# Patient Record
Sex: Male | Born: 1955 | Race: Black or African American | Hispanic: No | Marital: Single | Smoking: Former smoker
Health system: Southern US, Community
[De-identification: ages and names within clinical notes are randomized; demographics above are authoritative.]

## PROBLEM LIST (undated history)

## (undated) DIAGNOSIS — K649 Unspecified hemorrhoids: Secondary | ICD-10-CM

## (undated) DIAGNOSIS — J439 Emphysema, unspecified: Secondary | ICD-10-CM

## (undated) DIAGNOSIS — J9383 Other pneumothorax: Secondary | ICD-10-CM

## (undated) DIAGNOSIS — C61 Malignant neoplasm of prostate: Secondary | ICD-10-CM

## (undated) DIAGNOSIS — D496 Neoplasm of unspecified behavior of brain: Secondary | ICD-10-CM

## (undated) DIAGNOSIS — J45909 Unspecified asthma, uncomplicated: Secondary | ICD-10-CM

## (undated) DIAGNOSIS — R911 Solitary pulmonary nodule: Secondary | ICD-10-CM

## (undated) DIAGNOSIS — Z923 Personal history of irradiation: Secondary | ICD-10-CM

## (undated) DIAGNOSIS — F1010 Alcohol abuse, uncomplicated: Secondary | ICD-10-CM

## (undated) DIAGNOSIS — Z720 Tobacco use: Secondary | ICD-10-CM

## (undated) HISTORY — DX: Personal history of irradiation: Z92.3

## (undated) HISTORY — PX: PROSTATE BIOPSY: SHX241

## (undated) HISTORY — PX: BACK SURGERY: SHX140

---

## 2015-02-25 DIAGNOSIS — Z720 Tobacco use: Secondary | ICD-10-CM | POA: Diagnosis not present

## 2015-02-25 DIAGNOSIS — Z6822 Body mass index (BMI) 22.0-22.9, adult: Secondary | ICD-10-CM | POA: Diagnosis not present

## 2015-02-25 DIAGNOSIS — Z0000 Encounter for general adult medical examination without abnormal findings: Secondary | ICD-10-CM | POA: Diagnosis not present

## 2015-02-25 DIAGNOSIS — R030 Elevated blood-pressure reading, without diagnosis of hypertension: Secondary | ICD-10-CM | POA: Diagnosis not present

## 2015-02-25 DIAGNOSIS — E782 Mixed hyperlipidemia: Secondary | ICD-10-CM | POA: Diagnosis not present

## 2015-07-27 ENCOUNTER — Emergency Department (HOSPITAL_COMMUNITY)
Admission: EM | Admit: 2015-07-27 | Discharge: 2015-07-27 | Disposition: A | Discharge status: Home / Self Care | Payer: Commercial Managed Care - HMO | Attending: Emergency Medicine | Admitting: Emergency Medicine

## 2015-07-27 ENCOUNTER — Encounter (HOSPITAL_COMMUNITY): Payer: Self-pay

## 2015-07-27 DIAGNOSIS — F17210 Nicotine dependence, cigarettes, uncomplicated: Secondary | ICD-10-CM | POA: Insufficient documentation

## 2015-07-27 DIAGNOSIS — K5901 Slow transit constipation: Secondary | ICD-10-CM

## 2015-07-27 DIAGNOSIS — I159 Secondary hypertension, unspecified: Secondary | ICD-10-CM | POA: Diagnosis not present

## 2015-07-27 DIAGNOSIS — Z791 Long term (current) use of non-steroidal anti-inflammatories (NSAID): Secondary | ICD-10-CM | POA: Diagnosis not present

## 2015-07-27 DIAGNOSIS — R140 Abdominal distension (gaseous): Secondary | ICD-10-CM | POA: Diagnosis present

## 2015-07-27 DIAGNOSIS — K644 Residual hemorrhoidal skin tags: Secondary | ICD-10-CM | POA: Diagnosis not present

## 2015-07-27 HISTORY — DX: Unspecified hemorrhoids: K64.9

## 2015-07-27 NOTE — ED Notes (Signed)
Discharge instructions and follow up care reviewed with patient. Patient verbalized understanding. 

## 2015-07-27 NOTE — ED Triage Notes (Signed)
Pt here with constipation.  No bm x 3 days when he normally goes daily.  Eating and drinking like normal.  No n/v.  Abdomen is bloated. Pt has hemorrhoids.  No abdominal pain.  Using prune juice for small result only.

## 2015-07-27 NOTE — ED Provider Notes (Signed)
[facility] DEPT Provider Note   CSN: [id number] Arrival date & time: 07/27/15  1534  First Provider Contact:  First MD Initiated Contact with Patient 07/27/15 1633        History   Chief Complaint Chief Complaint  Patient presents with  . Constipation  . Rectal Pain    HPI DEMTRIUS ROUNDS is a 60 y.o. male.  He presents for evaluation of no stooling and abdominal swelling, for 3 days. He also has worsening hemorrhoids. During this period of time. Denies nausea, vomiting, fever, chills, headache, weakness or paresthesia. No prior similar problems. No prior history of hypertension, heart disease, or CVA. There are no other known modifying factors.   HPI  Past Medical History:  Diagnosis Date  . Hemorrhoids     There are no active problems to display for this patient.   Past Surgical History:  Procedure Laterality Date  . BACK SURGERY         Home Medications    Prior to Admission medications   Medication Sig Start Date End Date Taking? Authorizing Provider  naproxen sodium (ANAPROX) 220 MG tablet Take 220 mg by mouth daily as needed (for pain).   Yes Historical Provider, MD    Family History No family history on file.  Social History Social History  Substance Use Topics  . Smoking status: Current Every Day Smoker    Types: Cigarettes  . Smokeless tobacco: Never Used  . Alcohol use 1.8 oz/week    3 Cans of beer per week     Comment: daily     Allergies   Review of patient's allergies indicates no known allergies.   Review of Systems Review of Systems  All other systems reviewed and are negative.    Physical Exam Updated Vital Signs BP (!) 173/120 (BP Location: Left Arm)   Pulse 99   Temp 98.1 F (36.7 C) (Oral)   Resp 18   Ht '5\' 9"'$  (1.753 m)   Wt 155 lb (70.3 kg)   SpO2 97%   BMI 22.89 kg/m   Physical Exam  Constitutional: He appears well-developed and well-nourished.  HENT:  Head: Normocephalic and atraumatic.  Eyes:  Conjunctivae are normal.  Neck: Neck supple.  Cardiovascular: Normal rate.   Pulmonary/Chest: Effort normal. No respiratory distress.  Abdominal: Soft. He exhibits distension. He exhibits no mass. There is no tenderness. There is no guarding. No hernia.  Genitourinary:  Genitourinary Comments: Soft posterior external hemorrhoid. No stool in rectum on digital exam.  Musculoskeletal: He exhibits no edema.  Neurological: He is alert.  Skin: Skin is warm and dry. Rash: .ewed.  Psychiatric: He has a normal mood and affect.  Nursing note and vitals reviewed.    ED Treatments / Results  Labs (all labs ordered are listed, but only abnormal results are displayed) Labs Reviewed - No data to display  EKG  EKG Interpretation None       Radiology No results found.  Procedures Procedures (including critical care time)  Medications Ordered in ED Medications - No data to display   Initial Impression / Assessment and Plan / ED Course  I have reviewed the triage vital signs and the nursing notes.  Pertinent labs & imaging results that were available during my care of the patient were reviewed by me and considered in my medical decision making (see chart for details).  Clinical Course    Medications - No data to display  No data found.   6:26 PM Reevaluation with  update and discussion. After initial assessment and treatment, an updated evaluation reveals He remains comfortable. Findings discussed with patient, all questions were answered. Karmello Abercrombie L    Final Clinical Impressions(s) / ED Diagnoses   Final diagnoses:  Slow transit constipation  Secondary hypertension, unspecified    Abdominal pain with constipation. Mild hypertension. Doubt colitis, heart disease urgency, or metabolic instability.  Nursing Notes Reviewed/ Care Coordinated Applicable Imaging Reviewed Interpretation of Laboratory Data incorporated into ED treatment  The patient appears reasonably  screened and/or stabilized for discharge and I doubt any other medical condition or other [facility] requiring further screening, evaluation, or treatment in the ED at this time prior to discharge.  Plan: Home Medications- Miralax and Mag Citrate; Home Treatments- rest; return here if the recommended treatment, does not improve the symptoms; Recommended follow up- PCP check up in 1 week   New Prescriptions Discharge Medication List as of 07/27/2015  6:25 PM       Daleen Bo, MD 07/30/15 [phone]

## 2015-07-27 NOTE — Discharge Instructions (Signed)
For the hemorrhoids, soak in a warm tub 2 or 3 times a day.  It is also helpful, to use cortisone cream, 1% on the hemorrhoids. 2 or 3 times a day.  Your blood pressure is very high today. It is important to decrease the amount of salt intake, in your diet, and follow-up with a primary care doctor as soon as possible for further evaluation, and possible treatment of high blood pressure, with medications.  For the constipation, use magnesium citrate one half bottle today and one half tomorrow.  Also start a stool softener such as MiraLAX or Castor oil, and take it twice a day for 2 weeks.  It will also help to start using a high-fiber diet, and drinking 2 L of water each day.  Return here, if needed, for problems.

## 2015-07-27 NOTE — ED Notes (Signed)
MD aware of patient's vital signs; MD states patient to be discharged.

## 2016-10-07 ENCOUNTER — Encounter (HOSPITAL_COMMUNITY): Payer: Self-pay | Admitting: *Deleted

## 2016-10-07 ENCOUNTER — Emergency Department (HOSPITAL_COMMUNITY)
Admission: EM | Admit: 2016-10-07 | Discharge: 2016-10-07 | Disposition: A | Discharge status: Home / Self Care | Payer: Medicare HMO | Attending: Emergency Medicine | Admitting: Emergency Medicine

## 2016-10-07 DIAGNOSIS — R030 Elevated blood-pressure reading, without diagnosis of hypertension: Secondary | ICD-10-CM | POA: Diagnosis not present

## 2016-10-07 DIAGNOSIS — Z5321 Procedure and treatment not carried out due to patient leaving prior to being seen by health care provider: Secondary | ICD-10-CM

## 2016-10-07 DIAGNOSIS — R21 Rash and other nonspecific skin eruption: Secondary | ICD-10-CM | POA: Diagnosis present

## 2016-10-07 NOTE — ED Triage Notes (Signed)
Pt not in room.

## 2016-10-07 NOTE — ED Provider Notes (Signed)
Blood pressure (!) 160/111, pulse 100, temperature 97.6 F (36.4 C), temperature source Oral, resp. rate 16, SpO2 100 %.  Terry Page is a 61 y.o. male left without being seen after triage. I did not participate in the care of this patient.   Waynetta Pean 10/07/16 1616    Dorie Rank, MD 10/08/16 [phone]

## 2016-10-07 NOTE — ED Triage Notes (Signed)
Pt bib EMS and approx 4 days ago pt was exposed to [place].  Pt reports blisters on his hands and feet.  They are itching and when pt scratches, they start bleeding.  Pt has using a vasaline regimine without relief.  Pt denies any medical history but pt has been hypertensive for EMS. Denis any drugs or ETOH use.  Pt a/o x 4.   EMS VS:   BP: 162/116, HR: 70, RR: 16

## 2016-10-07 NOTE — ED Triage Notes (Signed)
Pt not in room. PA advised

## 2017-10-20 ENCOUNTER — Inpatient Hospital Stay (HOSPITAL_COMMUNITY): Payer: Medicare HMO

## 2017-10-20 ENCOUNTER — Inpatient Hospital Stay (HOSPITAL_COMMUNITY)
Admission: EM | Admit: 2017-10-20 | Discharge: 2017-10-24 | DRG: 201 | Disposition: A | Discharge status: Home / Self Care | Payer: Medicare HMO | Attending: Thoracic Surgery (Cardiothoracic Vascular Surgery) | Admitting: Thoracic Surgery (Cardiothoracic Vascular Surgery)

## 2017-10-20 ENCOUNTER — Emergency Department (HOSPITAL_COMMUNITY): Payer: Medicare HMO

## 2017-10-20 ENCOUNTER — Other Ambulatory Visit: Payer: Self-pay

## 2017-10-20 ENCOUNTER — Encounter (HOSPITAL_COMMUNITY): Payer: Self-pay | Admitting: Emergency Medicine

## 2017-10-20 DIAGNOSIS — J939 Pneumothorax, unspecified: Secondary | ICD-10-CM | POA: Diagnosis not present

## 2017-10-20 DIAGNOSIS — Z720 Tobacco use: Secondary | ICD-10-CM | POA: Diagnosis present

## 2017-10-20 DIAGNOSIS — R0603 Acute respiratory distress: Secondary | ICD-10-CM

## 2017-10-20 DIAGNOSIS — J984 Other disorders of lung: Secondary | ICD-10-CM | POA: Diagnosis not present

## 2017-10-20 DIAGNOSIS — R0689 Other abnormalities of breathing: Secondary | ICD-10-CM | POA: Diagnosis not present

## 2017-10-20 DIAGNOSIS — I10 Essential (primary) hypertension: Secondary | ICD-10-CM | POA: Diagnosis not present

## 2017-10-20 DIAGNOSIS — F17210 Nicotine dependence, cigarettes, uncomplicated: Secondary | ICD-10-CM | POA: Diagnosis present

## 2017-10-20 DIAGNOSIS — Z4682 Encounter for fitting and adjustment of non-vascular catheter: Secondary | ICD-10-CM | POA: Diagnosis not present

## 2017-10-20 DIAGNOSIS — I499 Cardiac arrhythmia, unspecified: Secondary | ICD-10-CM | POA: Diagnosis not present

## 2017-10-20 DIAGNOSIS — J441 Chronic obstructive pulmonary disease with (acute) exacerbation: Secondary | ICD-10-CM | POA: Diagnosis present

## 2017-10-20 DIAGNOSIS — J9383 Other pneumothorax: Secondary | ICD-10-CM

## 2017-10-20 DIAGNOSIS — J80 Acute respiratory distress syndrome: Secondary | ICD-10-CM | POA: Diagnosis not present

## 2017-10-20 DIAGNOSIS — Z9689 Presence of other specified functional implants: Secondary | ICD-10-CM

## 2017-10-20 DIAGNOSIS — J439 Emphysema, unspecified: Secondary | ICD-10-CM | POA: Diagnosis present

## 2017-10-20 DIAGNOSIS — J9311 Primary spontaneous pneumothorax: Secondary | ICD-10-CM | POA: Diagnosis not present

## 2017-10-20 DIAGNOSIS — R911 Solitary pulmonary nodule: Secondary | ICD-10-CM | POA: Diagnosis present

## 2017-10-20 DIAGNOSIS — R0902 Hypoxemia: Secondary | ICD-10-CM | POA: Diagnosis not present

## 2017-10-20 DIAGNOSIS — Z79899 Other long term (current) drug therapy: Secondary | ICD-10-CM | POA: Diagnosis not present

## 2017-10-20 DIAGNOSIS — R0602 Shortness of breath: Secondary | ICD-10-CM | POA: Diagnosis not present

## 2017-10-20 DIAGNOSIS — F1010 Alcohol abuse, uncomplicated: Secondary | ICD-10-CM | POA: Diagnosis present

## 2017-10-20 HISTORY — DX: Alcohol abuse, uncomplicated: F10.10

## 2017-10-20 HISTORY — DX: Solitary pulmonary nodule: R91.1

## 2017-10-20 HISTORY — DX: Other pneumothorax: J93.83

## 2017-10-20 HISTORY — DX: Tobacco use: Z72.0

## 2017-10-20 HISTORY — DX: Emphysema, unspecified: J43.9

## 2017-10-20 LAB — CBC WITH DIFFERENTIAL/PLATELET
Abs Immature Granulocytes: 0.04 10*3/uL (ref 0.00–0.07)
Basophils Absolute: 0 10*3/uL (ref 0.0–0.1)
Basophils Relative: 0 %
Eosinophils Absolute: 0 10*3/uL (ref 0.0–0.5)
Eosinophils Relative: 0 %
HCT: 43 % (ref 39.0–52.0)
Hemoglobin: 14.6 g/dL (ref 13.0–17.0)
Immature Granulocytes: 0 %
Lymphocytes Relative: 10 %
Lymphs Abs: 1.3 10*3/uL (ref 0.7–4.0)
MCH: 30.8 pg (ref 26.0–34.0)
MCHC: 34 g/dL (ref 30.0–36.0)
MCV: 90.7 fL (ref 80.0–100.0)
Monocytes Absolute: 1 10*3/uL (ref 0.1–1.0)
Monocytes Relative: 8 %
Neutro Abs: 10 10*3/uL — ABNORMAL HIGH (ref 1.7–7.7)
Neutrophils Relative %: 82 %
Platelets: 182 10*3/uL (ref 150–400)
RBC: 4.74 MIL/uL (ref 4.22–5.81)
RDW: 13.9 % (ref 11.5–15.5)
WBC: 12.4 10*3/uL — ABNORMAL HIGH (ref 4.0–10.5)
nRBC: 0 % (ref 0.0–0.2)

## 2017-10-20 LAB — BASIC METABOLIC PANEL
Anion gap: 12 (ref 5–15)
BUN: <5 mg/dL — ABNORMAL LOW (ref 8–23)
CO2: 24 mmol/L (ref 22–32)
Calcium: 9.6 mg/dL (ref 8.9–10.3)
Chloride: 94 mmol/L — ABNORMAL LOW (ref 98–111)
Creatinine, Ser: 0.65 mg/dL (ref 0.61–1.24)
GFR calc Af Amer: >60 mL/min (ref >60)
GFR calc non Af Amer: >60 mL/min (ref >60)
Glucose, Bld: 143 mg/dL — ABNORMAL HIGH (ref 70–99)
Potassium: 4.1 mmol/L (ref 3.5–5.1)
Sodium: 130 mmol/L — ABNORMAL LOW (ref 135–145)

## 2017-10-20 LAB — I-STAT TROPONIN, ED: Troponin i, poc: 0 ng/mL (ref 0.00–0.08)

## 2017-10-20 LAB — BRAIN NATRIURETIC PEPTIDE: B Natriuretic Peptide: 60.8 pg/mL (ref 0.0–100.0)

## 2017-10-20 MED ORDER — MORPHINE SULFATE (PF) 2 MG/ML IV SOLN
INTRAVENOUS | Status: AC
  Filled 2017-10-20: qty 1

## 2017-10-20 MED ORDER — OXYCODONE HCL 5 MG PO TABS: 10.0000 mg | ORAL_TABLET | ORAL | Status: DC | PRN

## 2017-10-20 MED ORDER — SODIUM CHLORIDE 0.9 % IV BOLUS
1000.0000 mL | Freq: Once | INTRAVENOUS | Status: AC
  Administered 2017-10-20: 1000 mL via INTRAVENOUS

## 2017-10-20 MED ORDER — ENOXAPARIN SODIUM 40 MG/0.4ML ~~LOC~~ SOLN
40.0000 mg | SUBCUTANEOUS | Status: DC
  Administered 2017-10-22 – 2017-10-24 (×3): 40 mg via SUBCUTANEOUS
  Filled 2017-10-20 (×4): qty 0.4

## 2017-10-20 MED ORDER — LIDOCAINE HCL (PF) 1 % IJ SOLN
INTRAMUSCULAR | Status: AC
  Administered 2017-10-20: 18:00:00
  Filled 2017-10-20: qty 30

## 2017-10-20 MED ORDER — DOCUSATE SODIUM 100 MG PO CAPS
200.0000 mg | ORAL_CAPSULE | Freq: Two times a day (BID) | ORAL | Status: DC
  Administered 2017-10-20 – 2017-10-24 (×7): 200 mg via ORAL
  Filled 2017-10-20 (×9): qty 2

## 2017-10-20 MED ORDER — MORPHINE SULFATE (PF) 2 MG/ML IV SOLN
2.0000 mg | Freq: Once | INTRAVENOUS | Status: AC
  Administered 2017-10-20: 2 mg via INTRAVENOUS
  Filled 2017-10-20: qty 1

## 2017-10-20 MED ORDER — ACETAMINOPHEN 325 MG PO TABS: 650.0000 mg | ORAL_TABLET | Freq: Four times a day (QID) | ORAL | Status: DC | PRN

## 2017-10-20 MED ORDER — LABETALOL HCL 5 MG/ML IV SOLN
10.0000 mg | INTRAVENOUS | Status: DC | PRN
  Administered 2017-10-20 – 2017-10-24 (×4): 10 mg via INTRAVENOUS
  Filled 2017-10-20 (×4): qty 4

## 2017-10-20 MED ORDER — MIDAZOLAM HCL 2 MG/2ML IJ SOLN
2.0000 mg | Freq: Once | INTRAMUSCULAR | Status: AC
  Administered 2017-10-20: 2 mg via INTRAVENOUS
  Filled 2017-10-20: qty 2

## 2017-10-20 MED ORDER — MORPHINE SULFATE (PF) 2 MG/ML IV SOLN
2.0000 mg | Freq: Once | INTRAVENOUS | Status: AC
  Administered 2017-10-20: 2 mg via INTRAVENOUS

## 2017-10-20 MED ORDER — MORPHINE SULFATE (PF) 2 MG/ML IV SOLN
2.0000 mg | INTRAVENOUS | Status: DC | PRN
Start: 2017-10-20 — End: 2017-10-23
  Administered 2017-10-20 – 2017-10-21 (×2): 2 mg via INTRAVENOUS
  Filled 2017-10-20 (×2): qty 1

## 2017-10-20 MED ORDER — IPRATROPIUM-ALBUTEROL 0.5-2.5 (3) MG/3ML IN SOLN: 3.0000 mL | Freq: Four times a day (QID) | RESPIRATORY_TRACT | Status: DC | PRN

## 2017-10-20 MED ORDER — OXYCODONE HCL 5 MG PO TABS: 5.0000 mg | ORAL_TABLET | ORAL | Status: DC | PRN

## 2017-10-20 NOTE — ED Triage Notes (Signed)
Per [facility], pt from home with a c/o of SOB since 11am. Noted to have labored breathing, diminished lungs sounds, wheezing, and possible plueral rub noted to right lung. 5 mg albuterol neb tx administered. Crackles and wheezing noted post neb tx. A total of 3 tablets of nitro (0.4 mg tabs) given with a decrease in BP.   Initial on scene v/s: BP 240/160 HR140 RR 40 SpO2 82 % RA  Post nitro and neb: BP 132/84 HR 152 RR 32 SpO2 97 % with CPAP CO2 22 CBG 133

## 2017-10-20 NOTE — ED Provider Notes (Signed)
[facility] EMERGENCY DEPARTMENT Provider Note   CSN: [id number] Arrival date & time: 10/20/17  1614     History   Chief Complaint Chief Complaint  Patient presents with  . SOB/C-pap    HPI Terry Page is a 62 y.o. male.  He is brought in by EMS after calling 911 for acute shortness of breath.  He says around 11 AM he was getting more short of breath especially when he laid down.  He was feeling some tightness in his chest.  EMS found him to have diminished breath sounds and gave him 5 mg of albuterol.  After the treatment he began to be more tachycardic and wet sounding so they put him on BiPAP and gave him 3 tablets of nitroglycerin for systolics above 350.  His initial sat was 82% on arrival by EMS.  Currently the patient is complaining of some improved shortness of breath denies chest pain.  He said he is never had this problem before.  No fevers no chills no abdominal pain vomiting or diarrhea.  He is a smoker but does not see a doctor and does not have any medical problems.  The history is provided by the patient.  Shortness of Breath  This is a new problem. The problem has been gradually improving. Associated symptoms include cough, wheezing and orthopnea. Pertinent negatives include no fever, no headaches, no rhinorrhea, no sore throat, no neck pain, no sputum production, no hemoptysis, no chest pain, no abdominal pain, no rash, no leg pain and no leg swelling. It is unknown what precipitated the problem. Risk factors include smoking. He has tried nothing for the symptoms. The treatment provided no relief. He has had no prior hospitalizations.    Past Medical History:  Diagnosis Date  . Hemorrhoids     There are no active problems to display for this patient.   Past Surgical History:  Procedure Laterality Date  . BACK SURGERY          Home Medications    Prior to Admission medications   Medication Sig Start Date End Date Taking? Authorizing  Provider  naproxen sodium (ANAPROX) 220 MG tablet Take 220 mg by mouth daily as needed (for pain).    [provider]    Family History No family history on file.  Social History Social History   Tobacco Use  . Smoking status: Current Every Day Smoker    Packs/day: 0.50    Types: Cigarettes  . Smokeless tobacco: Never Used  Substance Use Topics  . Alcohol use: Yes    Alcohol/week: 3.0 standard drinks    Types: 3 Cans of beer per week    Comment: daily  . Drug use: No     Allergies   Patient has no known allergies.   Review of Systems Review of Systems  Constitutional: Negative for fever.  HENT: Negative for rhinorrhea and sore throat.   Eyes: Negative for visual disturbance.  Respiratory: Positive for cough, shortness of breath and wheezing. Negative for hemoptysis and sputum production.   Cardiovascular: Positive for orthopnea. Negative for chest pain and leg swelling.  Gastrointestinal: Negative for abdominal pain.  Genitourinary: Negative for dysuria.  Musculoskeletal: Negative for neck pain.  Skin: Negative for rash.  Neurological: Negative for headaches.     Physical Exam Updated Vital Signs Pulse (!) 145   Physical Exam  Constitutional: He appears well-developed and well-nourished.  HENT:  Head: Normocephalic and atraumatic.  Eyes: Conjunctivae are normal.  Neck:  Neck supple.  Cardiovascular: Regular rhythm and intact distal pulses. Tachycardia present.  No murmur heard. Pulmonary/Chest: Tachypnea noted. No respiratory distress. He has wheezes. He has rales in the right lower field and the left lower field.  Abdominal: Soft. There is no tenderness.  Musculoskeletal: Normal range of motion. He exhibits no edema, tenderness or deformity.  Neurological: He is alert. He has normal strength. No sensory deficit. GCS eye subscore is 4. GCS verbal subscore is 5. GCS motor subscore is 6.  Skin: Skin is warm and dry.  Psychiatric: He has a normal mood  and affect.  Nursing note and vitals reviewed.    ED Treatments / Results  Labs (all labs ordered are listed, but only abnormal results are displayed) Labs Reviewed  BASIC METABOLIC PANEL - Abnormal; Notable for the following components:      Result Value   Sodium 130 (*)    Chloride 94 (*)    Glucose, Bld 143 (*)    BUN <5 (*)    All other components within normal limits  CBC WITH DIFFERENTIAL/PLATELET - Abnormal; Notable for the following components:   WBC 12.4 (*)    Neutro Abs 10.0 (*)    All other components within normal limits  BRAIN NATRIURETIC PEPTIDE  I-STAT TROPONIN, ED    EKG EKG Interpretation  Date/Time:  Saturday October 20 2017 16:22:00 EDT Ventricular Rate:  147 PR Interval:    QRS Duration: 86 QT Interval:  278 QTC Calculation: 435 R Axis:   -56 Text Interpretation:  Sinus tachycardia Consider right atrial enlargement LAD, consider left anterior fascicular block Probable left ventricular hypertrophy Anterior Q waves, possibly due to LVH no prior to compare with Confirmed by Aletta Edouard [phone]) on 10/20/2017 4:32:06 PM   Radiology Dg Chest Portable 1 View  Result Date: 10/20/2017 CLINICAL DATA:  Shortness of Breath EXAM: PORTABLE CHEST 1 VIEW COMPARISON:  None. FINDINGS: There is a large right pneumothorax, approximately 30-40%. Scarring in the left upper lobe. Otherwise left lung clear. Heart is mildly enlarged. No effusions or acute bony abnormality. IMPRESSION: Large right pneumothorax, approximately 30-40%. Left upper lobe scarring. These results were called by telephone at the time of interpretation on 10/20/2017 at 5:19 pm to Dr. Aletta Edouard , who verbally acknowledged these results. Electronically Signed   By: Rolm Baptise M.D.   On: 10/20/2017 17:22    Procedures .Critical Care Performed by: Hayden Rasmussen, MD Authorized by: Hayden Rasmussen, MD   Critical care provider statement:    Critical care time (minutes):  30   Critical  care time was exclusive of:  Separately billable procedures and treating other patients   Critical care was necessary to treat or prevent imminent or life-threatening deterioration of the following conditions:  Respiratory failure   Critical care was time spent personally by me on the following activities:  Discussions with consultants, evaluation of patient's response to treatment, examination of patient, ordering and performing treatments and interventions, ordering and review of laboratory studies, ordering and review of radiographic studies, pulse oximetry, re-evaluation of patient's condition, obtaining history from patient or surrogate, review of old charts and development of treatment plan with patient or surrogate   I assumed direction of critical care for this patient from another provider in my specialty: no     (including critical care time)  Medications Ordered in ED Medications - No data to display   Initial Impression / Assessment and Plan / ED Course  I have reviewed the triage  vital signs and the nursing notes.  Pertinent labs & imaging results that were available during my care of the patient were reviewed by me and considered in my medical decision making (see chart for details).  Clinical Course as of Oct 21 1810  Sat Oct 20, 2017  1727 Discussed with Dr. Roxy Manns from [facility] who will present to the emergency department to evaluate the patient and likely be putting a chest tube and admitting to his service.  Dated the patient and his family on current status.   [MB]    Clinical Course User Index [MB] Hayden Rasmussen, MD     Final Clinical Impressions(s) / ED Diagnoses   Final diagnoses:  Primary spontaneous pneumothorax  Respiratory distress    ED Discharge Orders    None       Hayden Rasmussen, MD 10/21/17 1153

## 2017-10-20 NOTE — H&P (Signed)
[address]       [place] [address]             [phone]          CARDIOTHORACIC SURGERY HISTORY AND PHYSICAL EXAM  PCP is Dr. Nyoka Cowden Referring Provider is No ref. provider found  Chief Complaint:  Shortness of breath  HPI:  Patient is a 62 year old African-American male with long-standing history of tobacco abuse and heavy alcohol use who was in his usual state of health until approximately 11 AM this morning when he developed sudden onset of severe shortness of breath.  He was brought in via EMS to the emergency department where chest x-ray reveals large right pneumothorax.  Cardiothoracic surgical consultation was requested.   Patient is married and lives locally in [place].  He is retired.  He states that he remains reasonably active physically although he does not exercise on a regular basis.  He reports no specific physical limitations.  He denies any history of recent trauma of any type.  He denies any recent respiratory illnesses or coughing.  He denies any previous history of spontaneous pneumothorax.  He smokes less than 1/2 pack of cigarettes daily but he has smoked for many years.  He denies any previous known history of COPD or emphysema.  He drinks at least 4 5 beers every day.  Past Medical History:  Diagnosis Date  . Hemorrhoids     Past Surgical History:  Procedure Laterality Date  . BACK SURGERY      History reviewed. No pertinent family history.  Social History   Socioeconomic History  . Marital status: Single    Spouse name: Not on file  . Number of children: Not on file  . Years of education: Not on file  . Highest education level: Not on file  Occupational History  . Not on file  Social Needs  . Financial resource strain: Not on file  . Food insecurity:    Worry: Not on file    Inability: Not on file  . Transportation needs:    Medical: Not on file    Non-medical: Not on file  Tobacco Use  . Smoking status:  Current Every Day Smoker    Packs/day: 0.50    Types: Cigarettes  . Smokeless tobacco: Never Used  Substance and Sexual Activity  . Alcohol use: Yes    Alcohol/week: 12.0 standard drinks    Types: 12 Cans of beer per week    Comment: daily  . Drug use: No  . Sexual activity: Not on file  Lifestyle  . Physical activity:    Days per week: Not on file    Minutes per session: Not on file  . Stress: Not on file  Relationships  . Social connections:    Talks on phone: Not on file    Gets together: Not on file    Attends religious service: Not on file    Active member of club or organization: Not on file    Attends meetings of clubs or organizations: Not on file    Relationship status: Not on file  . Intimate partner violence:    Fear of current or ex partner: Not on file    Emotionally abused: Not on file    Physically abused: Not on file    Forced sexual activity: Not on file  Other Topics Concern  . Not on file  Social History Narrative  . Not on file  Prior to Admission medications   Medication Sig Start Date End Date Taking? Authorizing Provider  naproxen sodium (ANAPROX) 220 MG tablet Take 220 mg by mouth daily as needed (for pain).    [provider]    Current Facility-Administered Medications  Medication Dose Route Frequency Provider Last Rate Last Dose  . midazolam (VERSED) injection 2 mg  2 mg Intravenous Once Rexene Alberts, MD      . morphine 2 MG/ML injection 2 mg  2 mg Intravenous Once Rexene Alberts, MD      . sodium chloride 0.9 % bolus 1,000 mL  1,000 mL Intravenous Once Hayden Rasmussen, MD 983.6 mL/hr at 10/20/17 1729 1,000 mL at 10/20/17 1729   Current Outpatient Medications  Medication Sig Dispense Refill  . naproxen sodium (ANAPROX) 220 MG tablet Take 220 mg by mouth daily as needed (for pain).      No Known Allergies    Review of Systems:   General:  normal appetite, normal energy, no weight gain, no weight loss, no  fever  Cardiac:  no chest pain with exertion, + chest pain at rest, + SOB with exertion, + sudden onset resting SOB, no PND, no orthopnea, no palpitations, no arrhythmia, no atrial fibrillation, no LE edema, no dizzy spells, no syncope  Respiratory:  + sudden onset shortness of breath, no home oxygen, no productive cough, no dry cough, no bronchitis, no wheezing, no hemoptysis, no asthma, no pain with inspiration or cough, no sleep apnea, no CPAP at night  GI:   no difficulty swallowing, no reflux, no frequent heartburn, no hiatal hernia, no abdominal pain, no constipation, no diarrhea, no hematochezia, no hematemesis, no melena  GU:   no dysuria,  no frequency, no urinary tract infection, no hematuria, no enlarged prostate, no kidney stones, no kidney disease  Vascular:  no pain suggestive of claudication, no pain in feet, no leg cramps, no varicose veins, no DVT, no non-healing foot ulcer  Neuro:   no stroke, no TIA's, no seizures, no headaches, no temporary blindness one eye,  no slurred speech, no peripheral neuropathy, no chronic pain, no instability of gait, no memory/cognitive dysfunction  Musculoskeletal: no arthritis , no joint swelling, no myalgias, no difficulty walking, normal mobility   Skin:   no rash, no itching, no skin infections, no pressure sores or ulcerations  Psych:   no anxiety, no depression, no nervousness, no unusual recent stress  Eyes:   no blurry vision, no floaters, no recent vision changes, does not wears glasses or contacts  ENT:   no hearing loss, no loose or painful teeth  Hematologic:  no easy bruising, no abnormal bleeding, no clotting disorder, no frequent epistaxis  Endocrine:  no diabetes, does not check CBG's at home     Physical Exam:   BP (!) 122/103   Pulse (!) 131   Temp (!) 97.5 F (36.4 C) (Axillary)   Resp (!) 22   Ht 5\' 10"  (1.778 m)   Wt 68 kg   SpO2 99%   BMI 21.52 kg/m   General:  WDWN male breathing comfortably on BiPAP    HEENT:  Unremarkable   Neck:   no JVD, no bruits, no adenopathy   Chest:   clear to auscultation, diminished breath sounds on right, few exp wheezes, no rhonchi   CV:   RRR, no  murmur   Abdomen:  soft, non-tender, no masses   Extremities:  warm, well-perfused, pulses palpable, no lower extremity edema  Rectal/GU  Deferred  Neuro:   Grossly non-focal and symmetrical throughout  Skin:   Clean and dry, no rashes, no breakdown  Diagnostic Tests:  Lab Results: Recent Labs    10/20/17 1624  WBC 12.4*  HGB 14.6  HCT 43.0  PLT 182   BMET:  Recent Labs    10/20/17 1624  NA 130*  K 4.1  CL 94*  CO2 24  GLUCOSE 143*  BUN <5*  CREATININE 0.65  CALCIUM 9.6    CBG (last 3)  No results for input(s): GLUCAP in the last 72 hours. PT/INR:  No results for input(s): LABPROT, INR in the last 72 hours.  CXR:  PORTABLE CHEST 1 VIEW  COMPARISON:  None.  FINDINGS: There is a large right pneumothorax, approximately 30-40%. Scarring in the left upper lobe. Otherwise left lung clear. Heart is mildly enlarged. No effusions or acute bony abnormality.  IMPRESSION: Large right pneumothorax, approximately 30-40%.  Left upper lobe scarring.  These results were called by telephone at the time of interpretation on 10/20/2017 at 5:19 pm to Dr. Aletta Edouard , who verbally acknowledged these results.   Electronically Signed   By: Rolm Baptise M.D.   On: 10/20/2017 17:22   EKG: Sinus tach w/out acute ischemic changes   Impression:  Large right primary spontaneous pneumothorax   Plan:  Chest tube placement and hospital admission.    I have discussed the nature of the patient's clinical problem at length with the patient and his family at the bedside.  We discussed treatment options and expectations for his convalescence.  Indications risks and potential benefits of chest tube placement were discussed at length.  The possibility of need for surgical intervention due to  prolonged air leak and/or recurrent pneumothorax was discussed.  All questions answered.   I spent in excess of 60 minutes during the conduct of this hospital consultation and >50% of this time involved direct face-to-face encounter for counseling and/or coordination of the patient's care.   Valentina Gu. Roxy Manns, MD 10/20/2017 5:52 PM

## 2017-10-20 NOTE — ED Triage Notes (Signed)
Pt reported to have a new onset of a cough x1 week.

## 2017-10-20 NOTE — Op Note (Signed)
CARDIOTHORACIC SURGERY OPERATIVE NOTE  Date of Procedure:  10/20/2017  Preoperative Diagnosis: Right Spontaneous Pneumothorax  Postoperative Diagnosis: Same  Procedure:   Right chest tube placement  Surgeon:   Valentina Gu. Roxy Manns, MD  Anesthesia: 1% lidocaine local with intravenous sedation    DETAILS OF THE OPERATIVE PROCEDURE  Following full informed consent the patient was given midazolam 2 mg intravenously and continuously monitored for rhythm, BP and oxygen saturation. The right chest was prepared and draped in a sterile manner. 1% lidocaine was utilized to anesthetize the skin and subcutaneous tissues. A small incision was made and a 28 French straight chest tube was placed through the incision into the pleural space. The tube was secured to the skin and connected to a closed suction collection device. The patient tolerated the procedure well. A portable CXR was ordered. There were no complications.    Valentina Gu. Roxy Manns, MD 10/20/2017 6:27 PM

## 2017-10-21 ENCOUNTER — Inpatient Hospital Stay (HOSPITAL_COMMUNITY): Payer: Medicare HMO

## 2017-10-21 MED ORDER — LEVALBUTEROL HCL 0.63 MG/3ML IN NEBU
0.6300 mg | INHALATION_SOLUTION | Freq: Three times a day (TID) | RESPIRATORY_TRACT | Status: DC
  Administered 2017-10-21 – 2017-10-22 (×6): 0.63 mg via RESPIRATORY_TRACT
  Filled 2017-10-21 (×7): qty 3

## 2017-10-21 MED ORDER — IBUPROFEN 400 MG PO TABS: 800.0000 mg | ORAL_TABLET | Freq: Three times a day (TID) | ORAL | Status: DC | PRN

## 2017-10-21 MED ORDER — TRAMADOL HCL 50 MG PO TABS
50.0000 mg | ORAL_TABLET | Freq: Four times a day (QID) | ORAL | Status: DC | PRN
  Administered 2017-10-21 – 2017-10-22 (×4): 50 mg via ORAL
  Filled 2017-10-21 (×4): qty 1

## 2017-10-21 MED ORDER — HYDRALAZINE HCL 20 MG/ML IJ SOLN
10.0000 mg | Freq: Four times a day (QID) | INTRAMUSCULAR | Status: DC | PRN
  Administered 2017-10-22 – 2017-10-24 (×3): 10 mg via INTRAVENOUS
  Filled 2017-10-21 (×3): qty 1

## 2017-10-21 NOTE — Progress Notes (Addendum)
      [address]       [place] [address]             [phone]      Subjective:  Complains of pain at chest tube site.  + productive cough with difficulty expectorating sputum.  Objective: Vital signs in last 24 hours: Temp:  [97.5 F (36.4 C)-98.6 F (37 C)] 98.6 F (37 C) (10/20 0426) Pulse Rate:  [75-145] 75 (10/20 0426) Cardiac Rhythm: Normal sinus rhythm (10/20 0700) Resp:  [17-26] 17 (10/20 0426) BP: (120-127)/(96-103) 122/103 (10/19 1715) SpO2:  [82 %-100 %] 97 % (10/20 0426) FiO2 (%):  [60 %-100 %] 100 % (10/19 1641) Weight:  [66.5 kg-68 kg] 66.5 kg (10/19 2000)  Intake/Output from previous day: 10/19 0701 - 10/20 0700 In: 1120 [P.O.:120; IV Piggyback:1000] Out: 300 [Urine:300]  General appearance: alert, cooperative and no distress Heart: regular rate and rhythm Lungs: diminished breath sounds bilaterally Abdomen: soft, non-tender; bowel sounds normal; no masses,  no organomegaly Extremities: extremities normal, atraumatic, no cyanosis or edema Wound: clean and ry  Lab Results: Recent Labs    10/20/17 1624  WBC 12.4*  HGB 14.6  HCT 43.0  PLT 182   BMET:  Recent Labs    10/20/17 1624  NA 130*  K 4.1  CL 94*  CO2 24  GLUCOSE 143*  BUN <5*  CREATININE 0.65  CALCIUM 9.6    PT/INR: No results for input(s): LABPROT, INR in the last 72 hours. ABG No results found for: PHART, HCO3, TCO2, ACIDBASEDEF, O2SAT CBG (last 3)  No results for input(s): GLUCAP in the last 72 hours.  Assessment/Plan:  1. Spontaneous Pneumothorax- Chest tube in place on 20 cm of suction, no air leak present- will leave on suction today 2. Pulm- severe underlying COPD- CXR without pneumothorax, thickened secretions with difficulty expectorating, will start Xopenex 3. Pain control- patient does not wish to take Oxy or Morphine as he doesn't like the way he feels, will add Tramadol, and Ibuprofen prn 4. CV- hemodynamically stable in NSR, BP  controlled 5. Lovenox for DVT prophylaxis 6. Dispo- patient stable, no air leak present, will leave on suction for now, xopenex nebs for secretions, will try Tramadol, Ibuprofen for pain relief, repeat CXR in AM   LOS: 1 day    Terry Page 10/21/2017   I have seen and examined the patient and agree with the assessment and plan as outlined.  Rexene Alberts, MD 10/21/2017 6:30 PM

## 2017-10-22 ENCOUNTER — Inpatient Hospital Stay (HOSPITAL_COMMUNITY): Payer: Medicare HMO

## 2017-10-22 ENCOUNTER — Encounter (HOSPITAL_COMMUNITY): Payer: Self-pay | Admitting: Radiology

## 2017-10-22 DIAGNOSIS — R911 Solitary pulmonary nodule: Secondary | ICD-10-CM

## 2017-10-22 DIAGNOSIS — J439 Emphysema, unspecified: Secondary | ICD-10-CM | POA: Diagnosis present

## 2017-10-22 DIAGNOSIS — J441 Chronic obstructive pulmonary disease with (acute) exacerbation: Secondary | ICD-10-CM | POA: Diagnosis present

## 2017-10-22 HISTORY — DX: Solitary pulmonary nodule: R91.1

## 2017-10-22 MED ORDER — IOPAMIDOL (ISOVUE-370) INJECTION 76%
100.0000 mL | Freq: Once | INTRAVENOUS | Status: AC | PRN
  Administered 2017-10-22: 100 mL via INTRAVENOUS

## 2017-10-22 MED ORDER — IOPAMIDOL (ISOVUE-370) INJECTION 76%
INTRAVENOUS | Status: AC
  Filled 2017-10-22: qty 100

## 2017-10-22 NOTE — Progress Notes (Addendum)
      [address]       [place] [address]             [phone]      Subjective:  No new complaints.  Tramadol worked well for his pain.    Objective: Vital signs in last 24 hours: Temp:  [97.5 F (36.4 C)-98.1 F (36.7 C)] 98.1 F (36.7 C) (10/21 0341) Pulse Rate:  [70-78] 78 (10/20 2026) Cardiac Rhythm: Normal sinus rhythm (10/21 0700) Resp:  [14-18] 14 (10/20 2026) BP: (127)/(100) 127/100 (10/20 1224) SpO2:  [98 %-100 %] 100 % (10/20 2026)  Intake/Output from previous day: 10/20 0701 - 10/21 0700 In: 440 [P.O.:440] Out: 280 [Urine:280]  General appearance: alert, cooperative and no distress Heart: regular rate and rhythm Lungs: clear to auscultation bilaterally Abdomen: soft, non-tender; bowel sounds normal; no masses,  no organomegaly Extremities: extremities normal, atraumatic, no cyanosis or edema Wound: clean and dry  Lab Results: Recent Labs    10/20/17 1624  WBC 12.4*  HGB 14.6  HCT 43.0  PLT 182   BMET:  Recent Labs    10/20/17 1624  NA 130*  K 4.1  CL 94*  CO2 24  GLUCOSE 143*  BUN <5*  CREATININE 0.65  CALCIUM 9.6    PT/INR: No results for input(s): LABPROT, INR in the last 72 hours. ABG No results found for: PHART, HCO3, TCO2, ACIDBASEDEF, O2SAT CBG (last 3)  No results for input(s): GLUCAP in the last 72 hours.  Assessment/Plan:  1. CV- NSR, +  HTN at times- continue prn Hydralazine, likely pain related, but if doesn't improve can start low dose antihypertensive with follow up with PCP 2. Chest tube- no air leak present, CXR is stable, will place chest tube to water seal 3. Pain control- Tramadol provides relief 4. Lovenox for DVT prophylaxis 5. Dispo- patient stable, will place chest tube to water seal, continue current care, watch BP   LOS: 2 days    Ellwood Handler 10/22/2017   I have seen and examined the patient and agree with the assessment and plan as outlined.  Will get CTA chest today to evaluate  severity of COPD and bullous blebs, r/o PE  Rexene Alberts, MD 10/22/2017 9:18 AM

## 2017-10-22 NOTE — Progress Notes (Signed)
Pt ambulating in hallway x2 with standby assist. HR 110-120's. Pt tolerated well. Will continue to monitor.  Clyde Canterbury, RN

## 2017-10-23 ENCOUNTER — Inpatient Hospital Stay (HOSPITAL_COMMUNITY): Payer: Medicare HMO

## 2017-10-23 ENCOUNTER — Other Ambulatory Visit: Payer: Self-pay | Admitting: Thoracic Surgery (Cardiothoracic Vascular Surgery)

## 2017-10-23 DIAGNOSIS — R911 Solitary pulmonary nodule: Secondary | ICD-10-CM

## 2017-10-23 MED ORDER — HYDROCHLOROTHIAZIDE 12.5 MG PO CAPS
12.5000 mg | ORAL_CAPSULE | Freq: Every day | ORAL | Status: DC
  Administered 2017-10-23 – 2017-10-24 (×2): 12.5 mg via ORAL
  Filled 2017-10-23 (×2): qty 1

## 2017-10-23 NOTE — Discharge Instructions (Signed)

## 2017-10-23 NOTE — Care Management Important Message (Signed)
Important Message  Patient Details  Name: Terry Page MRN: [id number] Date of Birth: Jun 14, 1955   Medicare Important Message Given:  Yes    Ashni Lonzo P Khalfani Weideman 10/23/2017, 2:54 PM

## 2017-10-23 NOTE — Progress Notes (Signed)
      [address]       [place] [address]             [phone]       Subjective:  No new complaints.  Doing well.    Objective: Vital signs in last 24 hours: Temp:  [97.4 F (36.3 C)-98 F (36.7 C)] 98 F (36.7 C) (10/22 0409) Pulse Rate:  [83-87] 84 (10/22 0409) Cardiac Rhythm: Normal sinus rhythm (10/22 0700) Resp:  [16-20] 20 (10/22 0409) BP: (145-174)/(86-110) 174/98 (10/22 0421) SpO2:  [96 %-99 %] 99 % (10/22 0409)  Intake/Output from previous day: 10/21 0701 - 10/22 0700 In: 480 [P.O.:480] Out: 325 [Urine:325]  General appearance: alert, cooperative and no distress Heart: regular rate and rhythm Lungs: clear to auscultation bilaterally Abdomen: soft, non-tender; bowel sounds normal; no masses,  no organomegaly Extremities: extremities normal, atraumatic, no cyanosis or edema Wound: clean and dry  Lab Results: Recent Labs    10/20/17 1624  WBC 12.4*  HGB 14.6  HCT 43.0  PLT 182   BMET:  Recent Labs    10/20/17 1624  NA 130*  K 4.1  CL 94*  CO2 24  GLUCOSE 143*  BUN <5*  CREATININE 0.65  CALCIUM 9.6    PT/INR: No results for input(s): LABPROT, INR in the last 72 hours. ABG No results found for: PHART, HCO3, TCO2, ACIDBASEDEF, O2SAT CBG (last 3)  No results for input(s): GLUCAP in the last 72 hours.  Assessment/Plan:  1. Chest tube- no air leak present, CXR showing chest tube is not fully in the chest CT scan obtained yesterday, shows small apical pneumothorax, bullous emphysema 2. New Left Upper Lobe Nodule- will get follow up CT Scan in 3 months as discussed with Dr. Roxy Manns 3. CV- Persistent HTN- will start low dose HCTZ, patient will require follow up with PCP at discharge 4. Lovenox for DVT prophylaxis 5. Dispo- patient stable, will d/c chest tube if okay with Dr. Roxy Manns, start HCTZ for BP control, will arrange follow up CT scan, if remains clinically stable possibly ready for d/c in AM  LOS: 3 days    Ellwood Handler 10/23/2017

## 2017-10-23 NOTE — Care Management Important Message (Signed)
Important Message  Patient Details  Name: Terry Page MRN: [id number] Date of Birth: 1955/03/13   Medicare Important Message Given:  Yes    Daiquan Resnik P Kit Mollett 10/23/2017, 1:09 PM

## 2017-10-23 NOTE — Discharge Summary (Signed)
Physician Discharge Summary  Patient ID: Terry Page MRN: [id number] DOB/AGE: 1955/05/19 62 y.o.  Admit date: 10/20/2017 Discharge date: 10/24/2017  Admission Diagnoses:  Patient Active Problem List   Diagnosis Date Noted  . 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 Spontaneous pneumothorax 10/20/2017  . Tobacco abuse   . Alcohol abuse    Discharge Diagnoses:   Patient Active Problem List   Diagnosis Date Noted  . Incidental lung nodule, greater than or equal to 66mm 10/22/2017  . 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 Spontaneous pneumothorax 10/20/2017  . Tobacco abuse   . Alcohol abuse    Discharged Condition: good  History of Present Illness:  Terry Page is a 62 yo AA male with long-standing history of tobacco abuse and heavy alcohol use who was in his usual state of health until approximately 11 AM on 10/20/2017 when he developed sudden onset of severe shortness of breath.  He was brought in via EMS to the emergency department where chest x-ray reveals large right pneumothorax.  Cardiothoracic surgery consult was requested.  He was evaluated by Dr. Roxy Manns who felt chest tube placement would be indicated.  Hospital Course:   He was admitted to the hospital and underwent right sided chest tube placement.  He tolerated the procedure without difficulty.  Follow up CXR showed re-expansion of the lung.  He did well in the hospital.  His chest tube did not exhibit evidence of an air leak.  He was transitioned to water seal with stable appearance of CXR.  His chest tube was removed on 10/23/2017.   Follow up CXR showed stable appearance of sub q air on right chest wall.  No significant pneumothorax.  CT scan of his chest was obtained during hospitalization.  This showed underlying bullous emphysema.  He was also found to have a 10 mm Spiculated Left Upper Lobe nodule which was concerning from bronchogenic carcinoma.  It was recommended that 3 month follow up CT scan be obtained.  There was no evidence of PE.   He has been hypertensive throughout his stay and was started on low dose HCTZ.  He will require follow up with PCP.  He is ambulating without difficulty.  He is tolerating a diet.  His pain is well controlled.  He is medically stable for discharge home today.    Significant Diagnostic Studies: radiology:   CT scan:  1. Small, spiculated 10 mm left upper lobe lung nodule near the hilum is highly suspicious for Bronchogenic Carcinoma. Consider one of the following as an outpatient: (a) follow-up PET-CT, or (b) repeat chest CT in 3 months. This recommendation follows the consensus statement: Guidelines for Management of Incidental Pulmonary Nodules Detected on CT Images: From the Fleischner Society 2017; Radiology 2017; 284:228-243. 2.  Negative for acute pulmonary embolus. 3. Right chest tube in place with small residual right pneumothorax, and trace right pleural effusion. Underlying bullous Emphysema (ICD10-J43.9) with architectural distortion in both lungs. Mild atelectasis. 4. Calcified coronary artery and Aortic Atherosclerosis   Treatments: chest tube placement  Discharge Exam: Blood pressure (!) 153/98, pulse 66, temperature 97.9 F (36.6 C), temperature source Oral, resp. rate 16, height 5\' 9"  (1.753 m), weight 66.5 kg, SpO2 100 %.  General appearance: alert, cooperative and no distress Heart: regular rate and rhythm Lungs: diminished breath sounds bibasilar Abdomen: soft, non-tender; bowel sounds normal; no masses,  no organomegaly Extremities: extremities normal, atraumatic, no cyanosis or edema Wound: clean and dry  Disposition: Home   Allergies as of  10/24/2017   No Known Allergies     Medication List    TAKE these medications   acetaminophen 325 MG tablet Commonly known as:  TYLENOL Take 2 tablets (650 mg total) by mouth every 6 (six) hours as needed for mild pain, fever or headache.   hydrochlorothiazide 12.5 MG capsule Commonly known as:  MICROZIDE Take 1  capsule (12.5 mg total) by mouth daily.   naproxen sodium 220 MG tablet Commonly known as:  ALEVE Take 220 mg by mouth daily as needed (back pain).      Follow-up Information    [facility] [place] Follow up on 11/05/2017.   Specialty:  Cardiothoracic Surgery Why:  Appointment is at 1:30, please get CXR at 1:00 at [facility] located on first floor of our office building Contact information: [place], [place]       Rexene Alberts, MD Follow up on 01/07/2018.   Specialty:  Cardiothoracic Surgery Why:  Appointment is at 1:00, Please get CT scan the AM of appointment, office will contact you with date and time for CT scan Contact information: [address] [place] [address] [phone]        [facility] [place] Follow up on 10/31/2017.   Specialty:  Cardiothoracic Surgery Why:  [place] is at 10:00 for suture removal Contact information: [address] [place], [place] [phone]          Signed: Ellwood Handler 10/24/2017, 8:10 AM

## 2017-10-23 NOTE — Progress Notes (Signed)
CT removed per order. Pt tolerated well. CT sutures closed and dressed. Will continue to monitor.  Clyde Canterbury, RN

## 2017-10-24 ENCOUNTER — Inpatient Hospital Stay (HOSPITAL_COMMUNITY): Payer: Medicare HMO

## 2017-10-24 MED ORDER — ACETAMINOPHEN 325 MG PO TABS: 650.0000 mg | ORAL_TABLET | Freq: Four times a day (QID) | ORAL | Status: DC | PRN

## 2017-10-24 MED ORDER — HYDROCHLOROTHIAZIDE 12.5 MG PO CAPS: 12.5000 mg | ORAL_CAPSULE | Freq: Every day | ORAL | 3 refills | Status: DC

## 2017-10-24 NOTE — Progress Notes (Signed)
IV and telemetry discontinued. CCMD notified. Discharge instructions reviewed with patient at this time. All questions answered.   Emelda Fear, RN

## 2017-10-24 NOTE — Consult Note (Signed)
            [facility] CM Primary Care Navigator  10/24/2017  Terry Page 08-Jan-1955 [id number]   Went to seepatient at the bedside to identify possible discharge needs buthe wasdischarged homeper staff.  Per MD note, patient had developed sudden onset of severe shortness of breath was brought to the emergency department had chest x-ray done that revealed large right pneumothorax, underwent right sided chest tube placement.  Patient has discharge instruction to follow-up withcardiothoracic surgery on 10/31/17 and 11/05/17.  Primary care provider listed is Dr. Maury Dus and their office is listed as providing transition of care (TOC) follow-up.Office was called and spoke with staff Edwena Felty) who verified that patient is located in their computer but has not been seen by an [facility] provider yet, and was also informed to notify patient to make sure to that he calls to set-up an appointment for follow-up with their office.  Call placed to patient and spoke with him briefly. Patient confirmed the need for him to follow-up with Dr. Alyson Ingles with [facility] (states he has their contact number) and he understands to call their office to set-up an appointment.   For additional questions please contact:  Edwena Felty A. Daeron Carreno, BSN, RN-BC [facility] PRIMARY CARE Navigator Cell: [phone]

## 2017-10-24 NOTE — Progress Notes (Addendum)
      [address]       [place] [address]             [phone]      Subjective:  No new complaints.  Feels good.  Ready to go home.  Objective: Vital signs in last 24 hours: Temp:  [97.9 F (36.6 C)-98.1 F (36.7 C)] 97.9 F (36.6 C) (10/23 0417) Pulse Rate:  [66-85] 66 (10/23 0618) Cardiac Rhythm: Normal sinus rhythm (10/23 0700) Resp:  [14-22] 16 (10/23 0618) BP: (128-185)/(94-114) 153/98 (10/23 0618) SpO2:  [98 %-100 %] 100 % (10/23 0417)  Intake/Output from previous day: 10/22 0701 - 10/23 0700 In: 73 [P.O.:460] Out: -   General appearance: alert, cooperative and no distress Heart: regular rate and rhythm Lungs: diminished breath sounds bibasilar Abdomen: soft, non-tender; bowel sounds normal; no masses,  no organomegaly Extremities: extremities normal, atraumatic, no cyanosis or edema Wound: clean and dry  Lab Results: No results for input(s): WBC, HGB, HCT, PLT in the last 72 hours. BMET: No results for input(s): NA, K, CL, CO2, GLUCOSE, BUN, CREATININE, CALCIUM in the last 72 hours.  PT/INR: No results for input(s): LABPROT, INR in the last 72 hours. ABG No results found for: PHART, HCO3, TCO2, ACIDBASEDEF, O2SAT CBG (last 3)  No results for input(s): GLUCAP in the last 72 hours.  Assessment/Plan:  1. CV- NSR, mild HTN- on low dose HCTZ, will follow up with PCP in a few weeks for further monitoring 2. Pulm- chest tube removal yesterday, CXR with mild sub q air on right chest, no pneumothorax appreciated.  New nodule on CT scan, have arranged 3 month follow up at our office with repeat CT scan 3. Dispo- patient stable, will d/c home today   LOS: 4 days    Erin Barrett 10/24/2017  I have seen and examined the patient and agree with the assessment and plan as outlined.  D/C home today.  No driving or strenuous exertion for 2 weeks.  No smoking forever.  Rexene Alberts, MD 10/24/2017 9:21 AM

## 2017-10-31 ENCOUNTER — Ambulatory Visit (INDEPENDENT_AMBULATORY_CARE_PROVIDER_SITE_OTHER): Payer: Medicare HMO

## 2017-10-31 ENCOUNTER — Other Ambulatory Visit: Payer: Self-pay

## 2017-10-31 DIAGNOSIS — R6889 Other general symptoms and signs: Secondary | ICD-10-CM | POA: Diagnosis not present

## 2017-10-31 DIAGNOSIS — Z4802 Encounter for removal of sutures: Secondary | ICD-10-CM

## 2017-10-31 NOTE — Progress Notes (Signed)
Patient arrived for nurse visit to remove 2 sutures post- procedure chest tube placement.  Sutures removed with no signs/ symptoms of infection noted.  Chest tube wound noted to not be completely healed.  Advised patient if wound opened to clean with perioxide, soap, and water, and call the office if signs of infection arise. Patient tolerated procedure well.  Patient instructed to keep the incision sites clean and dry.  Patient acknowledged instructions given.  He is aware of his follow-up appointment next week.

## 2017-11-05 ENCOUNTER — Ambulatory Visit (INDEPENDENT_AMBULATORY_CARE_PROVIDER_SITE_OTHER): Payer: Medicare HMO | Admitting: Surgical

## 2017-11-05 ENCOUNTER — Other Ambulatory Visit: Payer: Self-pay | Admitting: Thoracic Surgery (Cardiothoracic Vascular Surgery)

## 2017-11-05 ENCOUNTER — Ambulatory Visit
Admission: RE | Admit: 2017-11-05 | Discharge: 2017-11-05 | Disposition: A | Discharge status: Home / Self Care | Payer: Medicare HMO | Source: Ambulatory Visit | Attending: Thoracic Surgery (Cardiothoracic Vascular Surgery) | Admitting: Thoracic Surgery (Cardiothoracic Vascular Surgery)

## 2017-11-05 ENCOUNTER — Other Ambulatory Visit: Payer: Self-pay

## 2017-11-05 VITALS — BP 127/94 | HR 100 | Ht 69.0 in | Wt 150.0 lb

## 2017-11-05 DIAGNOSIS — J9383 Other pneumothorax: Secondary | ICD-10-CM

## 2017-11-05 DIAGNOSIS — D381 Neoplasm of uncertain behavior of trachea, bronchus and lung: Secondary | ICD-10-CM | POA: Diagnosis not present

## 2017-11-05 DIAGNOSIS — J939 Pneumothorax, unspecified: Secondary | ICD-10-CM | POA: Diagnosis not present

## 2017-11-05 DIAGNOSIS — J439 Emphysema, unspecified: Secondary | ICD-10-CM | POA: Diagnosis not present

## 2017-11-05 DIAGNOSIS — Z9889 Other specified postprocedural states: Secondary | ICD-10-CM

## 2017-11-05 DIAGNOSIS — R6889 Other general symptoms and signs: Secondary | ICD-10-CM | POA: Diagnosis not present

## 2017-11-05 DIAGNOSIS — R911 Solitary pulmonary nodule: Secondary | ICD-10-CM

## 2017-11-05 NOTE — Patient Instructions (Signed)
Given verbal instructions regarding activity progression

## 2017-11-05 NOTE — Progress Notes (Signed)
Terry Page       Terry Page,[place] [address]             [phone]      Terry Page Terry Page Medical Record #[id number] Date of Birth: 12-Mar-1955  Referring: Lacretia Leigh, MD Primary Care: Patient, No Pcp Per Primary Cardiologist: No primary care provider on file.   Chief Complaint:   POST OP FOLLOW UP  History of Present Illness:    Patient is a 62 year old male status post right-sided spontaneous pneumothorax treated with a chest tube seen in the office on today's date and routine follow-up.  He did not require surgical intervention other than the chest tube.  He does have marked bullous disease on his chest x-ray and a CT scan obtained also showed a left upper lobe nodule.  Today he reports that he feels well and denies any shortness of breath.  He is determined to continue with smoking cessation and has not smoked any cigarettes since discharge.  He understands the importance long-term of smoking cessation.  Overall he is pleased with his progress and feels well.  He is disabled due to previous back surgeries so currently does not work.      Past Medical History:  Diagnosis Date  . Alcohol abuse   . 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 Hemorrhoids   . Incidental lung nodule, greater than or equal to 56mm 10/22/2017   Left upper lobe - discovered on CTA  . Spontaneous pneumothorax 10/20/2017   right  . Tobacco abuse      Social History   Tobacco Use  Smoking Status Current Every Day Smoker  . Packs/day: 0.50  . Types: Cigarettes  Smokeless Tobacco Never Used    Social History   Substance and Sexual Activity  Alcohol Use Yes  . Alcohol/week: 12.0 standard drinks  . Types: 12 Cans of beer per week   Comment: daily     No Known Allergies  Current Outpatient Medications  Medication Sig Dispense Refill  . acetaminophen (TYLENOL) 325 MG tablet Take 2 tablets (650 mg total) by mouth every 6 (six) hours as needed for mild pain, fever or headache.     . hydrochlorothiazide (MICROZIDE) 12.5 MG capsule Take 1 capsule (12.5 mg total) by mouth daily. 30 capsule 3  . naproxen sodium (ALEVE) 220 MG tablet Take 220 mg by mouth daily as needed (back pain).     No current facility-administered medications for this visit.        Physical Exam: BP (!) 127/94 (BP Location: Right Arm, Patient Position: Sitting, Cuff Size: Large)   Pulse 100   Ht 5\' 9"  (1.753 m)   Wt 68 kg   SpO2 98% Comment: ON RA  BMI 22.15 kg/m   General appearance: alert, cooperative and no distress Heart: regular rate and rhythm Lungs: clear to auscultation bilaterally Wound: Chest tube site incision is well-healed without evidence of infection   Diagnostic Studies & Laboratory data:     Recent Radiology Findings:   Dg Chest 2 View  Result Date: 11/05/2017 CLINICAL DATA:  Followup right pneumothorax. EXAM: CHEST - 2 VIEW COMPARISON:  10/24/2017 FINDINGS: Heart size is normal. Chronic aortic atherosclerosis. Chronic bullous emphysema on both sides. I do not identify definite residual pneumothorax in this setting. No acute or focal finding otherwise. Bilateral nipple shadows as seen previously. IMPRESSION: Advanced bilateral bullous emphysema. No conclusive evidence of residual pneumothorax on the right by plain radiography. Electronically Signed  By: Nelson Chimes M.D.   On: 11/05/2017 13:16      Recent Lab Findings: Lab Results  Component Value Date   WBC 12.4 (H) 10/20/2017   HGB 14.6 10/20/2017   HCT 43.0 10/20/2017   PLT 182 10/20/2017   GLUCOSE 143 (H) 10/20/2017   NA 130 (L) 10/20/2017   K 4.1 10/20/2017   CL 94 (L) 10/20/2017   CREATININE 0.65 10/20/2017   BUN <5 (L) 10/20/2017   CO2 24 10/20/2017      Assessment / Plan: [name] is doing well following right-sided spontaneous pneumothorax with chest tube placement.  He does have significant bullous emphysema and a left upper lobe nodule.  He is scheduled for a 34-month follow-up CT scan of the  chest in January as well as an appointment to see Dr. Roxy Manns at that time to discuss any changes in the findings that would necessitate any further intervention.  I instructed the patient on the importance of long-term smoking cessation and he is quite motivated in this regard.  We discussed signs and symptoms of recurrence and if he has any similar symptoms to present back to the emergency department .  We also discussed activity progression and he has no significant lifestyle activities that should warrant changes in his activity level at this time.          John Giovanni, PA-C 11/05/2017 1:49 PM

## 2017-11-16 DIAGNOSIS — R739 Hyperglycemia, unspecified: Secondary | ICD-10-CM | POA: Diagnosis not present

## 2017-11-16 DIAGNOSIS — I10 Essential (primary) hypertension: Secondary | ICD-10-CM | POA: Diagnosis not present

## 2017-11-16 DIAGNOSIS — Z23 Encounter for immunization: Secondary | ICD-10-CM | POA: Diagnosis not present

## 2017-11-16 DIAGNOSIS — Z1211 Encounter for screening for malignant neoplasm of colon: Secondary | ICD-10-CM | POA: Diagnosis not present

## 2017-11-16 DIAGNOSIS — I7090 Unspecified atherosclerosis: Secondary | ICD-10-CM | POA: Diagnosis not present

## 2017-11-16 DIAGNOSIS — J439 Emphysema, unspecified: Secondary | ICD-10-CM | POA: Diagnosis not present

## 2017-11-16 DIAGNOSIS — R6889 Other general symptoms and signs: Secondary | ICD-10-CM | POA: Diagnosis not present

## 2017-11-19 ENCOUNTER — Other Ambulatory Visit: Payer: Self-pay | Admitting: Family Medicine

## 2017-11-19 ENCOUNTER — Other Ambulatory Visit (HOSPITAL_COMMUNITY): Payer: Self-pay | Admitting: Family Medicine

## 2017-11-19 DIAGNOSIS — I7090 Unspecified atherosclerosis: Secondary | ICD-10-CM

## 2017-11-19 DIAGNOSIS — C3490 Malignant neoplasm of unspecified part of unspecified bronchus or lung: Secondary | ICD-10-CM

## 2017-11-28 ENCOUNTER — Ambulatory Visit
Admission: RE | Admit: 2017-11-28 | Discharge: 2017-11-28 | Disposition: A | Discharge status: Home / Self Care | Payer: Medicare HMO | Source: Ambulatory Visit | Attending: Family Medicine | Admitting: Family Medicine

## 2017-11-28 DIAGNOSIS — I7090 Unspecified atherosclerosis: Secondary | ICD-10-CM

## 2017-11-28 DIAGNOSIS — Z0389 Encounter for observation for other suspected diseases and conditions ruled out: Secondary | ICD-10-CM | POA: Diagnosis not present

## 2017-11-28 DIAGNOSIS — F17200 Nicotine dependence, unspecified, uncomplicated: Secondary | ICD-10-CM | POA: Diagnosis not present

## 2017-11-28 DIAGNOSIS — R6889 Other general symptoms and signs: Secondary | ICD-10-CM | POA: Diagnosis not present

## 2017-12-03 ENCOUNTER — Ambulatory Visit (HOSPITAL_COMMUNITY): Payer: Medicare HMO

## 2017-12-03 ENCOUNTER — Encounter (HOSPITAL_COMMUNITY): Payer: Self-pay

## 2017-12-04 ENCOUNTER — Encounter (HOSPITAL_COMMUNITY): Payer: Self-pay

## 2017-12-04 ENCOUNTER — Other Ambulatory Visit: Payer: Self-pay

## 2017-12-04 ENCOUNTER — Observation Stay (HOSPITAL_COMMUNITY)
Admission: EM | Admit: 2017-12-04 | Discharge: 2017-12-06 | Disposition: A | Discharge status: Home / Self Care | Payer: Medicare HMO | Attending: Internal Medicine | Admitting: Internal Medicine

## 2017-12-04 DIAGNOSIS — N179 Acute kidney failure, unspecified: Secondary | ICD-10-CM | POA: Diagnosis not present

## 2017-12-04 DIAGNOSIS — E871 Hypo-osmolality and hyponatremia: Principal | ICD-10-CM

## 2017-12-04 DIAGNOSIS — Z720 Tobacco use: Secondary | ICD-10-CM | POA: Diagnosis present

## 2017-12-04 DIAGNOSIS — J441 Chronic obstructive pulmonary disease with (acute) exacerbation: Secondary | ICD-10-CM | POA: Diagnosis present

## 2017-12-04 DIAGNOSIS — J439 Emphysema, unspecified: Secondary | ICD-10-CM | POA: Insufficient documentation

## 2017-12-04 DIAGNOSIS — N189 Chronic kidney disease, unspecified: Secondary | ICD-10-CM | POA: Diagnosis not present

## 2017-12-04 DIAGNOSIS — F1010 Alcohol abuse, uncomplicated: Secondary | ICD-10-CM | POA: Diagnosis not present

## 2017-12-04 DIAGNOSIS — R6889 Other general symptoms and signs: Secondary | ICD-10-CM | POA: Diagnosis not present

## 2017-12-04 DIAGNOSIS — I10 Essential (primary) hypertension: Secondary | ICD-10-CM | POA: Diagnosis not present

## 2017-12-04 DIAGNOSIS — R911 Solitary pulmonary nodule: Secondary | ICD-10-CM | POA: Diagnosis not present

## 2017-12-04 DIAGNOSIS — F17210 Nicotine dependence, cigarettes, uncomplicated: Secondary | ICD-10-CM | POA: Diagnosis not present

## 2017-12-04 DIAGNOSIS — R7889 Finding of other specified substances, not normally found in blood: Secondary | ICD-10-CM | POA: Diagnosis not present

## 2017-12-04 DIAGNOSIS — Z79899 Other long term (current) drug therapy: Secondary | ICD-10-CM | POA: Diagnosis not present

## 2017-12-04 DIAGNOSIS — R5381 Other malaise: Secondary | ICD-10-CM | POA: Diagnosis not present

## 2017-12-04 DIAGNOSIS — Z1211 Encounter for screening for malignant neoplasm of colon: Secondary | ICD-10-CM | POA: Diagnosis not present

## 2017-12-04 LAB — BASIC METABOLIC PANEL
Anion gap: 16 — ABNORMAL HIGH (ref 5–15)
BUN: 22 mg/dL (ref 8–23)
CO2: 20 mmol/L — ABNORMAL LOW (ref 22–32)
Calcium: 9.6 mg/dL (ref 8.9–10.3)
Chloride: 82 mmol/L — ABNORMAL LOW (ref 98–111)
Creatinine, Ser: 2 mg/dL — ABNORMAL HIGH (ref 0.61–1.24)
GFR calc Af Amer: 40 mL/min — ABNORMAL LOW (ref >60)
GFR calc non Af Amer: 34 mL/min — ABNORMAL LOW (ref >60)
Glucose, Bld: 101 mg/dL — ABNORMAL HIGH (ref 70–99)
Potassium: 4.5 mmol/L (ref 3.5–5.1)
Sodium: 118 mmol/L — CL (ref 135–145)

## 2017-12-04 LAB — CBC WITH DIFFERENTIAL/PLATELET
Abs Immature Granulocytes: 0.05 10*3/uL (ref 0.00–0.07)
Basophils Absolute: 0 10*3/uL (ref 0.0–0.1)
Basophils Relative: 0 %
Eosinophils Absolute: 0 10*3/uL (ref 0.0–0.5)
Eosinophils Relative: 0 %
HCT: 39.8 % (ref 39.0–52.0)
Hemoglobin: 13.6 g/dL (ref 13.0–17.0)
Immature Granulocytes: 0 %
Lymphocytes Relative: 9 %
Lymphs Abs: 1.1 10*3/uL (ref 0.7–4.0)
MCH: 30.2 pg (ref 26.0–34.0)
MCHC: 34.2 g/dL (ref 30.0–36.0)
MCV: 88.4 fL (ref 80.0–100.0)
Monocytes Absolute: 0.8 10*3/uL (ref 0.1–1.0)
Monocytes Relative: 7 %
Neutro Abs: 9.5 10*3/uL — ABNORMAL HIGH (ref 1.7–7.7)
Neutrophils Relative %: 84 %
Platelets: 236 10*3/uL (ref 150–400)
RBC: 4.5 MIL/uL (ref 4.22–5.81)
RDW: 13.1 % (ref 11.5–15.5)
WBC: 11.5 10*3/uL — ABNORMAL HIGH (ref 4.0–10.5)
nRBC: 0 % (ref 0.0–0.2)

## 2017-12-04 MED ORDER — SODIUM CHLORIDE 0.9 % IV BOLUS
1000.0000 mL | Freq: Once | INTRAVENOUS | Status: AC
  Administered 2017-12-04: 1000 mL via INTRAVENOUS

## 2017-12-04 MED ORDER — SODIUM CHLORIDE 0.9 % IV SOLN
INTRAVENOUS | Status: DC
  Administered 2017-12-04 – 2017-12-06 (×3): via INTRAVENOUS

## 2017-12-04 MED ORDER — ENOXAPARIN SODIUM 30 MG/0.3ML ~~LOC~~ SOLN
30.0000 mg | SUBCUTANEOUS | Status: DC
  Administered 2017-12-05: 30 mg via SUBCUTANEOUS
  Filled 2017-12-04 (×2): qty 0.3

## 2017-12-04 MED ORDER — ONDANSETRON HCL 4 MG/2ML IJ SOLN: 4.0000 mg | Freq: Four times a day (QID) | INTRAMUSCULAR | Status: DC | PRN

## 2017-12-04 MED ORDER — DOCUSATE SODIUM 100 MG PO CAPS: 100.0000 mg | ORAL_CAPSULE | Freq: Two times a day (BID) | ORAL | Status: DC | PRN

## 2017-12-04 MED ORDER — BISACODYL 10 MG RE SUPP: 10.0000 mg | Freq: Every day | RECTAL | Status: DC | PRN

## 2017-12-04 MED ORDER — ACETAMINOPHEN 325 MG PO TABS: 650.0000 mg | ORAL_TABLET | Freq: Four times a day (QID) | ORAL | Status: DC | PRN

## 2017-12-04 MED ORDER — ONDANSETRON HCL 4 MG PO TABS: 4.0000 mg | ORAL_TABLET | Freq: Four times a day (QID) | ORAL | Status: DC | PRN

## 2017-12-04 NOTE — ED Provider Notes (Addendum)
[facility] EMERGENCY DEPARTMENT Provider Note   CSN: [id number] Arrival date & time: 12/04/17  1533     History   Chief Complaint Chief Complaint  Patient presents with  . Abnormal Lab    HPI Terry Page is a 62 y.o. male.  Chief complaint low sodium.  Patient went to his primary care doctor recently for routine blood work.  He was noted to have a low sodium.  This has not happened before.  He has no specific complaints.  His medical history includes right pneumothorax and CT angio chest from 10/22/2017 reveals a 10 mm spiculated nodule in the left upper lobe.  No known history of renal disease.     Past Medical History:  Diagnosis Date  . Alcohol abuse   . 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 Hemorrhoids   . Incidental lung nodule, greater than or equal to 63mm 10/22/2017   Left upper lobe - discovered on CTA  . Spontaneous pneumothorax 10/20/2017   right  . Tobacco abuse     Patient Active Problem List   Diagnosis Date Noted  . Hyponatremia 12/04/2017  . AKI (acute kidney injury) ([place]) 12/04/2017  . Incidental lung nodule, greater than or equal to 34mm 10/22/2017  . 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 Spontaneous pneumothorax 10/20/2017  . Tobacco abuse   . Alcohol abuse     Past Surgical History:  Procedure Laterality Date  . BACK SURGERY          Home Medications    Prior to Admission medications   Medication Sig Start Date End Date Taking? Authorizing Provider  docusate sodium (COLACE) 100 MG capsule Take 100 mg by mouth 2 (two) times daily.   Yes [provider]  hydrochlorothiazide (MICROZIDE) 12.5 MG capsule Take 1 capsule (12.5 mg total) by mouth daily. 10/24/17  Yes Barrett, Erin R, PA-C  naproxen sodium (ALEVE) 220 MG tablet Take 220 mg by mouth daily as needed (back pain).   Yes [provider]  olmesartan (BENICAR) 40 MG tablet Take 40 mg by mouth daily. 11/16/17  Yes [provider]  acetaminophen (TYLENOL)  325 MG tablet Take 2 tablets (650 mg total) by mouth every 6 (six) hours as needed for mild pain, fever or headache. Patient not taking: Reported on 12/04/2017 10/24/17   Barrett, Lodema Hong, PA-C    Family History History reviewed. No pertinent family history.  Social History Social History   Tobacco Use  . Smoking status: Current Every Day Smoker    Packs/day: 0.50    Types: Cigarettes  . Smokeless tobacco: Never Used  Substance Use Topics  . Alcohol use: Yes    Alcohol/week: 12.0 standard drinks    Types: 12 Cans of beer per week    Comment: daily  . Drug use: No     Allergies   Patient has no known allergies.   Review of Systems Review of Systems  All other systems reviewed and are negative.    Physical Exam Updated Vital Signs BP (!) 116/94   Pulse 98   Resp 16   Ht 5\' 9"  (1.753 m)   Wt 72.6 kg   SpO2 95%   BMI 23.63 kg/m   Physical Exam  Constitutional: He is oriented to person, place, and time. He appears well-developed and well-nourished.  HENT:  Head: Normocephalic and atraumatic.  Eyes: Conjunctivae are normal.  Neck: Neck supple.  Cardiovascular: Normal rate and regular rhythm.  Pulmonary/Chest: Effort normal and breath  sounds normal.  Abdominal: Soft. Bowel sounds are normal.  Musculoskeletal: Normal range of motion.  Neurological: He is alert and oriented to person, place, and time.  Skin: Skin is warm and dry.  Psychiatric: He has a normal mood and affect. His behavior is normal.  Nursing note and vitals reviewed.    ED Treatments / Results  Labs (all labs ordered are listed, but only abnormal results are displayed) Labs Reviewed  CBC WITH DIFFERENTIAL/PLATELET - Abnormal; Notable for the following components:      Result Value   WBC 11.5 (*)    Neutro Abs 9.5 (*)    All other components within normal limits  BASIC METABOLIC PANEL - Abnormal; Notable for the following components:   Sodium 118 (*)    Chloride 82 (*)    CO2 20 (*)     Glucose, Bld 101 (*)    Creatinine, Ser 2.00 (*)    GFR calc non Af Amer 34 (*)    GFR calc Af Amer 40 (*)    Anion gap 16 (*)    All other components within normal limits  HIV ANTIBODY (ROUTINE TESTING W REFLEX)  BASIC METABOLIC PANEL  CBC  BASIC METABOLIC PANEL  URINALYSIS, ROUTINE W REFLEX MICROSCOPIC    EKG None  Radiology No results found.  Procedures Procedures (including critical care time)  Medications Ordered in ED Medications  acetaminophen (TYLENOL) tablet 650 mg (has no administration in time range)  enoxaparin (LOVENOX) injection 30 mg (has no administration in time range)  docusate sodium (COLACE) capsule 100 mg (has no administration in time range)  bisacodyl (DULCOLAX) suppository 10 mg (has no administration in time range)  ondansetron (ZOFRAN) tablet 4 mg (has no administration in time range)    Or  ondansetron (ZOFRAN) injection 4 mg (has no administration in time range)  0.9 %  sodium chloride infusion (has no administration in time range)  sodium chloride 0.9 % bolus 1,000 mL (has no administration in time range)     Initial Impression / Assessment and Plan / ED Course  I have reviewed the triage vital signs and the nursing notes.  Pertinent labs & imaging results that were available during my care of the patient were reviewed by me and considered in my medical decision making (see chart for details).     Patient presents with no specific complaints.  Laboratory data reveals a sodium of 118 and a creatinine of 2.0.  Chest CT from 10/22/2017 reveals a 10 mm spiculated nodule in the left upper lobe patient will be admitted for further evaluation of same.  Final Clinical Impressions(s) / ED Diagnoses   Final diagnoses:  Hyponatremia  Chronic kidney disease, unspecified CKD stage    ED Discharge Orders    None       Nat Christen, MD 12/04/17 L[place]is Gene    Nat Christen, MD 12/04/17 2250

## 2017-12-04 NOTE — H&P (Signed)
History and Physical    Terry Page YNW:[id number] DOB: Oct 04, 1955 DOA: 12/04/2017  PCP: [name], MD Consultants:  none Patient coming from: home- lives alone  Chief Complaint: Hyponatremia  HPI: Terry Page is a 62 y.o. male with medical history significant for tobacco and alcohol abuse who presented to the ED today at the direction of his PCP due to a sodium of 118.  According to patient he went to his PCP for routine checkup and had lab work.  Patient denies complaints.  He was also noted to have a creatinine of 2 up from 0.65 in October. Patient was admitted to the hospital in October 2019 for spontaneous pneumothorax; received a chest tube.  During the time, CT of the chest showed a 10 mm spiculated left upper lobe nodule which was concerning for bronchogenic carcinoma.  The plan was to repeat chest CT in 3 months which would be in January.  He was started on low-dose hydrochlorothiazide for hypertension during that stay.  He states he has been compliant with this.  He reports no nausea vomiting or diarrhea.  No fever or chills.  No recent travel or sick contacts.   ED Course: He was HDS, alert and oriented, no complaints. Labs were unremarkable with the exception of the aforementioned abnormalities.  Review of Systems: As per HPI; otherwise review of systems reviewed and negative.   Ambulatory Status:  Ambulates without assistance  Past Medical History:  Diagnosis Date  . Alcohol abuse   . 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 Hemorrhoids   . Incidental lung nodule, greater than or equal to 66mm 10/22/2017   Left upper lobe - discovered on CTA  . Spontaneous pneumothorax 10/20/2017   right  . Tobacco abuse     Past Surgical History:  Procedure Laterality Date  . BACK SURGERY      Social History   Socioeconomic History  . Marital status: Single    Spouse name: Not on file  . Number of children: Not on file  . Years of education: Not on file  . Highest education  level: Not on file  Occupational History  . Not on file  Social Needs  . Financial resource strain: Not on file  . Food insecurity:    Worry: Not on file    Inability: Not on file  . Transportation needs:    Medical: Not on file    Non-medical: Not on file  Tobacco Use  . Smoking status: Current Every Day Smoker    Packs/day: 0.50    Types: Cigarettes  . Smokeless tobacco: Never Used  Substance and Sexual Activity  . Alcohol use: Yes    Alcohol/week: 12.0 standard drinks    Types: 12 Cans of beer per week    Comment: daily  . Drug use: No  . Sexual activity: Not on file  Lifestyle  . Physical activity:    Days per week: Not on file    Minutes per session: Not on file  . Stress: Not on file  Relationships  . Social connections:    Talks on phone: Not on file    Gets together: Not on file    Attends religious service: Not on file    Active member of club or organization: Not on file    Attends meetings of clubs or organizations: Not on file    Relationship status: Not on file  . Intimate partner violence:    Fear of current or ex partner: Not on  file    Emotionally abused: Not on file    Physically abused: Not on file    Forced sexual activity: Not on file  Other Topics Concern  . Not on file  Social History Narrative  . Not on file    No Known Allergies  History reviewed. No pertinent family history.  Prior to Admission medications   Medication Sig Start Date End Date Taking? Authorizing Provider  acetaminophen (TYLENOL) 325 MG tablet Take 2 tablets (650 mg total) by mouth every 6 (six) hours as needed for mild pain, fever or headache. 10/24/17   Barrett, Erin R, PA-C  hydrochlorothiazide (MICROZIDE) 12.5 MG capsule Take 1 capsule (12.5 mg total) by mouth daily. 10/24/17   Barrett, Erin R, PA-C  naproxen sodium (ALEVE) 220 MG tablet Take 220 mg by mouth daily as needed (back pain).    [provider]    Physical Exam: Vitals:   12/04/17 1535  12/04/17 1600 12/04/17 1615 12/04/17 1630  BP:  115/84 108/88 (!) 116/94  Pulse:  96 98 98  Resp:  19 20 16   SpO2:  94% 95% 95%  Weight: 72.6 kg     Height: 5\' 9"  (1.753 m)        . General: Appears calm and comfortable and is in NAD . Eyes:  PERRL, EOMI, normal lids, iris . ENT:  grossly normal hearing, lips & tongue, mmm . Neck:  supple, no lymphadenopathy . Cardiovascular:  nL S1, S2, normal rate, reg rhythm, no murmur. [name] Respiratory:   CTA bilaterally with no wheezes/rales/rhonchi.  Normal respiratory effort. . Abdomen:  soft, NT, ND, NABS . Back:   grossly normal alignment . Skin:  no rash or lesions seen on limited exam . Musculoskeletal:  grossly normal tone BUE/BLE, good ROM, no bony abnormality or obvious joint deformity . Lower extremities:  No LE edema.  Limited foot exam with no ulcerations.  2+ distal pulses. [name] Psychiatric:  grossly normal mood and affect, speech fluent and appropriate, AOx3 . Neurologic:  CN 2-12 grossly intact, moves all extremities in coordinated fashion, sensation intact, Patellar DTRs 2+ and symmetric    Radiological Exams on Admission: No results found.  EKG: none   Labs on Admission: I have personally reviewed the available labs and imaging studies at the time of the admission.  Pertinent labs:  Sodium 118, last checked 130 on October 20, 2017 Potassium 4.5 Chloride 82 CO2 20 Glucose 101 BUN 22 creatinine 2.00, last checked 0.65 on October 20, 2017 White blood cells 11.5 hemoglobin 13.6 platelets 236  Assessment/Plan Principal Problem:   Hyponatremia Active Problems:   Tobacco abuse   Alcohol abuse   Bullous emphysema ([name])   Incidental lung nodule, greater than or equal to 42mm   AKI (acute kidney injury) ([place])    Hyponatremia: Given the patient was started on hydrochlorothiazide in October, this is the most likely culprit.  He also has likely bronchogenic carcinoma which could cause SIADH but I feel like the acute component  is most likely due to the thiazide diuretic. -Hold hydrochlorothiazide -We will bolus 1 L normal saline now and then 125 cc an hour -Check BMP every 6 hours to ensure no overcorrection -anticipate he may be discharged tomorrow if AKI has improved and Na has normalized (note last checked Na before today was 130 so will likely not get it to normal range)  AKI: This has developed relatively abruptly as well since October 19.  I suspect this is due to volume  depletion from hydrochlorothiazide.  Patient also has been taking Benicar for hypertension. He also takes Aleve on occasion which could be playing a role. -Hold HCTZ, ARB, hold all NSAIDs -IV fluids as above -Renally dose meds, avoid nephrotoxins -Check urinalysis  HTN -appears to be isolated diastolic HTN; hold HCTZ and ARB as above -consider imdur or BB if needed for persistent HTN  Spiculated left upper lobe nodule concerning for bronchogenic carcinoma -Follow-up with 67-month surveillance CT as scheduled in January  History of alcohol abuse; reports he has not drank heavily in several weeks; no current s/s withdrawal -CIWA protocol   DVT prophylaxis: lovenox Code Status:  Full - confirmed with patient/family Family Communication: none  Disposition Plan:  Home once clinically improved Consults called: none  Admission status: It is my clinical opinion that referral for OBSERVATION is reasonable and necessary in this patient based on the above information provided. The aforementioned taken together are felt to place the patient at high risk for further clinical deterioration. However it is anticipated that the patient may be medically stable for discharge from the hospital within 24 to 48 hours.     Janora Norlander MD [facility]  If note is complete, please contact covering daytime or nighttime physician. [email or web address] Password [email or web address]  12/04/2017, 5:50 PM

## 2017-12-04 NOTE — ED Triage Notes (Signed)
Pt arrives to ED from home with complaints of abnormally low sodium levels as reported by his PCP since this morning. EMS reports pt has no symptoms, pt is alert and oriented, ambulatory. Pt placed in position of comfort with bed locked and lowered, call bell in reach.

## 2017-12-05 DIAGNOSIS — E871 Hypo-osmolality and hyponatremia: Secondary | ICD-10-CM | POA: Diagnosis not present

## 2017-12-05 LAB — CBC
HCT: 34.2 % — ABNORMAL LOW (ref 39.0–52.0)
Hemoglobin: 11.6 g/dL — ABNORMAL LOW (ref 13.0–17.0)
MCH: 30 pg (ref 26.0–34.0)
MCHC: 33.9 g/dL (ref 30.0–36.0)
MCV: 88.4 fL (ref 80.0–100.0)
Platelets: 200 10*3/uL (ref 150–400)
RBC: 3.87 MIL/uL — ABNORMAL LOW (ref 4.22–5.81)
RDW: 13.2 % (ref 11.5–15.5)
WBC: 8.6 10*3/uL (ref 4.0–10.5)
nRBC: 0 % (ref 0.0–0.2)

## 2017-12-05 LAB — BASIC METABOLIC PANEL
Anion gap: 9 (ref 5–15)
Anion gap: 9 (ref 5–15)
BUN: 20 mg/dL (ref 8–23)
BUN: 18 mg/dL (ref 8–23)
CO2: 23 mmol/L (ref 22–32)
CO2: 24 mmol/L (ref 22–32)
Calcium: 8.7 mg/dL — ABNORMAL LOW (ref 8.9–10.3)
Calcium: 8.8 mg/dL — ABNORMAL LOW (ref 8.9–10.3)
Chloride: 89 mmol/L — ABNORMAL LOW (ref 98–111)
Chloride: 91 mmol/L — ABNORMAL LOW (ref 98–111)
Creatinine, Ser: 1.29 mg/dL — ABNORMAL HIGH (ref 0.61–1.24)
Creatinine, Ser: 1.07 mg/dL (ref 0.61–1.24)
GFR calc Af Amer: >60 mL/min (ref >60)
GFR calc Af Amer: >60 mL/min (ref >60)
GFR calc non Af Amer: 58 mL/min — ABNORMAL LOW (ref >60)
GFR calc non Af Amer: >60 mL/min (ref >60)
Glucose, Bld: 106 mg/dL — ABNORMAL HIGH (ref 70–99)
Glucose, Bld: 104 mg/dL — ABNORMAL HIGH (ref 70–99)
Potassium: 4.1 mmol/L (ref 3.5–5.1)
Potassium: 4.9 mmol/L (ref 3.5–5.1)
Sodium: 121 mmol/L — ABNORMAL LOW (ref 135–145)
Sodium: 124 mmol/L — ABNORMAL LOW (ref 135–145)

## 2017-12-05 LAB — URINALYSIS, ROUTINE W REFLEX MICROSCOPIC
Bilirubin Urine: NEGATIVE
Glucose, UA: NEGATIVE mg/dL
Hgb urine dipstick: NEGATIVE
Ketones, ur: 20 mg/dL — AB
Leukocytes, UA: NEGATIVE
Nitrite: NEGATIVE
Protein, ur: 30 mg/dL — AB
Specific Gravity, Urine: 1.015 (ref 1.005–1.030)
pH: 5 (ref 5.0–8.0)

## 2017-12-05 LAB — HIV ANTIBODY (ROUTINE TESTING W REFLEX): HIV Screen 4th Generation wRfx: NONREACTIVE

## 2017-12-05 MED ORDER — SODIUM CHLORIDE 1 G PO TABS
1.0000 g | ORAL_TABLET | Freq: Three times a day (TID) | ORAL | Status: DC
  Administered 2017-12-05 – 2017-12-06 (×3): 1 g via ORAL
  Filled 2017-12-05 (×5): qty 1

## 2017-12-05 MED ORDER — IRBESARTAN 300 MG PO TABS
300.0000 mg | ORAL_TABLET | Freq: Every day | ORAL | Status: DC
  Administered 2017-12-05 – 2017-12-06 (×2): 300 mg via ORAL
  Filled 2017-12-05 (×2): qty 1

## 2017-12-05 MED ORDER — ENOXAPARIN SODIUM 40 MG/0.4ML ~~LOC~~ SOLN
40.0000 mg | SUBCUTANEOUS | Status: DC
  Administered 2017-12-05: 40 mg via SUBCUTANEOUS
  Filled 2017-12-05: qty 0.4

## 2017-12-05 NOTE — Progress Notes (Signed)
PROGRESS NOTE    Terry Page  ZJQ:[id number] DOB: [birth year] DOA: 12/04/2017 PCP: [name], MD   Brief Narrative: Patient is a 66 male with past medical history of tobacco, alcohol abuse who presented to the emergency department after his PCP sent him for hyponatremia of 118.  He was also found to be in acute kidney injury.  Admitted for further management.  Assessment & Plan:   Principal Problem:   Hyponatremia Active Problems:   Tobacco abuse   Alcohol abuse   Bullous emphysema ([name])   Incidental lung nodule, greater than or equal to 12mm   AKI (acute kidney injury) ([place])  Hyponatremia: Was on hydrochlorothiazide at home.  It has been held.  There was also questionable suspicion for SIADH because he has a spiculated left upper lobe nodule concerning for bronchogenic carcinoma. Sodium level is improving.  Continue hydration.  Restrict free water.  Also started on salt tablets.  Hopefully can be discharged tomorrow.  Acute kidney injury: Normalized with IV fluids.  On benicar at home which we will resume on discharge.  Hypertension: Mildly hypertensive this morning.  Continue to monitor.  Started on ARB.  Spiculated left upper lobe nodule: CT on 10/22/17 showed spiculated 10 mm left upper lobe lung nodule near the hilum ,highly suspicious for Bronchogenic Carcinoma.  Follow-up CT in 3 months is scheduled on January.  Needs to follow-up closely with his PCP.  History of alcohol abuse: Not on withdrawal.  Continue to monitor        DVT prophylaxis: Lovenox Code Status: Full Family Communication: None present at the bedside Disposition Plan: Home likely tomorrow   Consultants: None  Procedures: None  Antimicrobials: None  Subjective: Patient seen and examined the bedside this morning.  Remains comfortable.  No active issues.  Hemodynamically stable.  Objective: Vitals:   12/05/17 0206 12/05/17 0311 12/05/17 0439 12/05/17 0749  BP:  (!) 143/67 (!) 145/97 (!)  141/92  Pulse:  67 80 75  Resp:    20  Temp:   98 F (36.7 C) 97.6 F (36.4 C)  TempSrc:   Oral Oral  SpO2:   100% 98%  Weight:      Height: 5\' 9"  (1.753 m)       Intake/Output Summary (Last 24 hours) at 12/05/2017 1030 Last data filed at 12/04/2017 2110 Gross per 24 hour  Intake 1391.67 ml  Output -  Net 1391.67 ml   Filed Weights   12/04/17 1535  Weight: 72.6 kg    Examination:  General exam: Appears calm and comfortable ,Not in distress,average built HEENT:PERRL,Oral mucosa moist, Ear/Nose normal on gross exam Respiratory system: Bilateral equal air entry, normal vesicular breath sounds, no wheezes or crackles  Cardiovascular system: S1 & S2 heard, RRR. No JVD, murmurs, rubs, gallops or clicks. No pedal edema. Gastrointestinal system: Abdomen is nondistended, soft and nontender. No organomegaly or masses felt. Normal bowel sounds heard. Central nervous system: Alert and oriented. No focal neurological deficits. Extremities: No edema, no clubbing ,no cyanosis, distal peripheral pulses palpable. Skin: No rashes, lesions or ulcers,no icterus ,no pallor MSK: Normal muscle bulk,tone ,power Psychiatry: Judgement and insight appear normal. Mood & affect appropriate.     Data Reviewed: I have personally reviewed following labs and imaging studies  CBC: Recent Labs  Lab 12/04/17 1546 12/05/17 0422  WBC 11.5* 8.6  NEUTROABS 9.5*  --   HGB 13.6 11.6*  HCT 39.8 34.2*  MCV 88.4 88.4  PLT 236 240   Basic Metabolic  Panel: Recent Labs  Lab 12/04/17 1546 12/05/17 0034 12/05/17 0422  NA 118* 121* 124*  K 4.5 4.1 4.9  CL 82* 89* 91*  CO2 20* 23 24  GLUCOSE 101* 106* 104*  BUN 22 20 18   CREATININE 2.00* 1.29* 1.07  CALCIUM 9.6 8.7* 8.8*   GFR: Estimated Creatinine Clearance: 69.7 mL/min (by C-G formula based on SCr of 1.07 mg/dL). Liver Function Tests: No results for input(s): AST, ALT, ALKPHOS, BILITOT, PROT, ALBUMIN in the last 168 hours. No results for  input(s): LIPASE, AMYLASE in the last 168 hours. No results for input(s): AMMONIA in the last 168 hours. Coagulation Profile: No results for input(s): INR, PROTIME in the last 168 hours. Cardiac Enzymes: No results for input(s): CKTOTAL, CKMB, CKMBINDEX, TROPONINI in the last 168 hours. BNP (last 3 results) No results for input(s): PROBNP in the last 8760 hours. HbA1C: No results for input(s): HGBA1C in the last 72 hours. CBG: No results for input(s): GLUCAP in the last 168 hours. Lipid Profile: No results for input(s): CHOL, HDL, LDLCALC, TRIG, CHOLHDL, LDLDIRECT in the last 72 hours. Thyroid Function Tests: No results for input(s): TSH, T4TOTAL, FREET4, T3FREE, THYROIDAB in the last 72 hours. Anemia Panel: No results for input(s): VITAMINB12, FOLATE, FERRITIN, TIBC, IRON, RETICCTPCT in the last 72 hours. Sepsis Labs: No results for input(s): PROCALCITON, LATICACIDVEN in the last 168 hours.  No results found for this or any previous visit (from the past 240 hour(s)).       Radiology Studies: No results found.      Scheduled Meds: . enoxaparin (LOVENOX) injection  30 mg Subcutaneous Q24H  . sodium chloride  1 g Oral TID WC   Continuous Infusions: . sodium chloride 125 mL/hr at 12/05/17 0259     LOS: 1 day    Time spent: 35 mins.More than 50% of that time was spent in counseling and/or coordination of care.      Shelly Coss, MD [facility] Pager [phone]  If 7PM-7AM, please contact night-coverage [email or web address] Password [email or web address] 12/05/2017, 10:30 AM

## 2017-12-05 NOTE — Care Management Obs Status (Signed)
[place] NOTIFICATION   Patient Details  Name: Terry Page MRN: [id number] Date of Birth: Oct 17, 1955   Medicare Observation Status Notification Given:  Yes    Pollie Friar, RN 12/05/2017, 5:18 PM

## 2017-12-05 NOTE — Care Management CC44 (Signed)
Condition Code 44 Documentation Completed  Patient Details  Name: KELAND PEYTON MRN: [id number] Date of Birth: 11-25-1955   Condition Code 44 given:  Yes Patient signature on Condition Code 44 notice:  Yes Documentation of 2 MD's agreement:  Yes Code 44 added to claim:  Yes    Pollie Friar, RN 12/05/2017, 5:18 PM

## 2017-12-06 DIAGNOSIS — E871 Hypo-osmolality and hyponatremia: Secondary | ICD-10-CM | POA: Diagnosis not present

## 2017-12-06 LAB — BASIC METABOLIC PANEL
Anion gap: 8 (ref 5–15)
BUN: 11 mg/dL (ref 8–23)
CO2: 21 mmol/L — ABNORMAL LOW (ref 22–32)
Calcium: 8.6 mg/dL — ABNORMAL LOW (ref 8.9–10.3)
Chloride: 97 mmol/L — ABNORMAL LOW (ref 98–111)
Creatinine, Ser: 0.67 mg/dL (ref 0.61–1.24)
GFR calc Af Amer: >60 mL/min (ref >60)
GFR calc non Af Amer: >60 mL/min (ref >60)
Glucose, Bld: 94 mg/dL (ref 70–99)
Potassium: 4.2 mmol/L (ref 3.5–5.1)
Sodium: 126 mmol/L — ABNORMAL LOW (ref 135–145)

## 2017-12-06 MED ORDER — SODIUM CHLORIDE 1 G PO TABS: 1.0000 g | ORAL_TABLET | Freq: Three times a day (TID) | ORAL | 0 refills | Status: AC

## 2017-12-06 MED ORDER — AMLODIPINE BESYLATE 10 MG PO TABS: 10.0000 mg | ORAL_TABLET | Freq: Every day | ORAL | 0 refills | Status: DC

## 2017-12-06 NOTE — Care Management Note (Signed)
Case Management Note  Patient Details  Name: Terry Page MRN: [id number] Date of Birth: 11-17-1955  Subjective/Objective:  Pt in with hyponatremia. He is from home alone.  Pt denies issues obtaining his medications.  Pt has no issues with transportation.                   Action/Plan: Pt discharged home with self care.  Pt arranged for a cab ride home per bedside RN.  Expected Discharge Date:  12/06/17               Expected Discharge Plan:  Home/Self Care  In-House Referral:     Discharge planning Services     Post Acute Care Choice:    Choice offered to:     DME Arranged:    DME Agency:     HH Arranged:    HH Agency:     Status of Service:  Completed, signed off  If discussed at [employer] of Stay Meetings, dates discussed:    Additional Comments:  Pollie Friar, RN 12/06/2017, 12:01 PM

## 2017-12-06 NOTE — Progress Notes (Signed)
Pt d/c'd home with belongings, pt called a cab to get home, pt independent. NT walked with pt to main entrance, pt declined wheelchair.

## 2017-12-06 NOTE — Progress Notes (Signed)
Discharge instructions (including medications) discussed with and copy provided to patient/caregiver 

## 2017-12-06 NOTE — Discharge Summary (Signed)
Physician Discharge Summary  Terry Page NKN:[id number] DOB: 03/07/1955 DOA: 12/04/2017  PCP: [name], MD  Admit date: 12/04/2017 Discharge date: 12/06/2017  Admitted From: Home Disposition:  Home  Discharge Condition:Stable CODE STATUS:FULL Diet recommendation: Heart Healthy  Brief/Interim Summary: Patient is a 56 male with past medical history of tobacco, alcohol abuse who presented to the emergency department after his PCP sent him for hyponatremia of 118.  He was also found to be in acute kidney injury.  Admitted for further management.  His kidney function improved with IV fluids.  His sodium level improved with IV fluids and salt tablets.  Today sodium level is 126.  He is stable for discharge home today.  He will follow-up with his PCP to check his sodium level in a week.  Following problems were addressed during his hospitalization:   Hyponatremia: Was on hydrochlorothiazide at home.  It has been held.  There was also questionable suspicion for SIADH because he has a spiculated left upper lobe nodule concerning for bronchogenic carcinoma. Sodium level is improving.  Also started on salt tablets.    Acute kidney injury: Normalized with IV fluids.  On benicar at home which we will resume on discharge.  Hypertension: Mildly hypertensive this morning.  Continue  on ARB.Added amlodipine.  Spiculated left upper lobe nodule: CT on 10/22/17 showed spiculated 10 mm left upper lobe lung nodule near the hilum ,highly suspicious for Bronchogenic Carcinoma.  Follow-up CT in 3 months is scheduled on January.  Needs to follow-up closely with his PCP.  History of alcohol abuse: Not on withdrawal.  Continue to monitor.  Counseled for cessation   Discharge Diagnoses:  Principal Problem:   Hyponatremia Active Problems:   Tobacco abuse   Alcohol abuse   Bullous emphysema ([name])   Incidental lung nodule, greater than or equal to 64mm   AKI (acute kidney injury)  [facility])    Discharge Instructions  Discharge Instructions    Diet - low sodium heart healthy   Complete by:  As directed    Discharge instructions   Complete by:  As directed    1) Please take prescribed medications as instructed. 2)Follow up with your PCP in a week.  Do a BMP test to check your sodium level during the follow-up. 3)We have discontinued hydrochlorothiazide and started on amlodipine 10 mg daily.  Continue to monitor blood pressure at home. 4)Avoid alcohol. 5)Do follow-up CT chest in January as discussed..   Increase activity slowly   Complete by:  As directed      Allergies as of 12/06/2017   No Known Allergies     Medication List    STOP taking these medications   acetaminophen 325 MG tablet Commonly known as:  TYLENOL   hydrochlorothiazide 12.5 MG capsule Commonly known as:  MICROZIDE     TAKE these medications   amLODipine 10 MG tablet Commonly known as:  NORVASC Take 1 tablet (10 mg total) by mouth daily.   docusate sodium 100 MG capsule Commonly known as:  COLACE Take 100 mg by mouth 2 (two) times daily.   naproxen sodium 220 MG tablet Commonly known as:  ALEVE Take 220 mg by mouth daily as needed (back pain).   olmesartan 40 MG tablet Commonly known as:  BENICAR Take 40 mg by mouth daily.   sodium chloride 1 g tablet Take 1 tablet (1 g total) by mouth 3 (three) times daily with meals for 7 days.      Follow-up Information  [name], MD. Schedule an appointment as soon as possible for a visit in 1 week(s).   Specialty:  Family Medicine Contact information: [place] [address] [phone]          No Known Allergies  Consultations:  None   Procedures/Studies: US Aorta Complete  Result Date: 11/28/2017 CLINICAL DATA:  Rule out abdominal aortic aneurysm.  Smoker EXAM: ULTRASOUND OF ABDOMINAL AORTA TECHNIQUE: Ultrasound examination of the abdominal aorta was performed to evaluate for abdominal  aortic aneurysm. COMPARISON:  None. FINDINGS: Abdominal aortic measurements as follows: Proximal:  2.9 cm Mid:  1.6 cm Distal:  1.8 cm Right iliac artery 1.1 cm.  Left iliac artery 1.1 cm IMPRESSION: Negative for abdominal aortic aneurysm. Electronically Signed   By: Franchot Gallo M.D.   On: 11/28/2017 14:50       Subjective:   Discharge Exam: Vitals:   12/06/17 0352 12/06/17 0750  BP: (!) 150/99 (!) 160/102  Pulse: 75 81  Resp: 18 16  Temp: 98.6 F (37 C) 98 F (36.7 C)  SpO2: 100% 98%   Vitals:   12/05/17 1959 12/06/17 0005 12/06/17 0352 12/06/17 0750  BP: (!) 149/100 (!) 167/101 (!) 150/99 (!) 160/102  Pulse: 86 79 75 81  Resp: 18 17 18 16   Temp: 98.1 F (36.7 C) 98.5 F (36.9 C) 98.6 F (37 C) 98 F (36.7 C)  TempSrc: Oral Oral Oral Oral  SpO2: 94% 98% 100% 98%  Weight:      Height:        General: Pt is alert, awake, not in acute distress Cardiovascular: RRR, S1/S2 +, no rubs, no gallops Respiratory: CTA bilaterally, no wheezing, no rhonchi Abdominal: Soft, NT, ND, bowel sounds + Extremities: no edema, no cyanosis    The results of significant diagnostics from this hospitalization (including imaging, microbiology, ancillary and laboratory) are listed below for reference.     Microbiology: No results found for this or any previous visit (from the past 240 hour(s)).   Labs: BNP (last 3 results) Recent Labs    10/20/17 1624  BNP 52.8   Basic Metabolic Panel: Recent Labs  Lab 12/04/17 1546 12/05/17 0034 12/05/17 0422 12/06/17 0359  NA 118* 121* 124* 126*  K 4.5 4.1 4.9 4.2  CL 82* 89* 91* 97*  CO2 20* 23 24 21*  GLUCOSE 101* 106* 104* 94  BUN 22 20 18 11   CREATININE 2.00* 1.29* 1.07 0.67  CALCIUM 9.6 8.7* 8.8* 8.6*   Liver Function Tests: No results for input(s): AST, ALT, ALKPHOS, BILITOT, PROT, ALBUMIN in the last 168 hours. No results for input(s): LIPASE, AMYLASE in the last 168 hours. No results for input(s): AMMONIA in the last 168  hours. CBC: Recent Labs  Lab 12/04/17 1546 12/05/17 0422  WBC 11.5* 8.6  NEUTROABS 9.5*  --   HGB 13.6 11.6*  HCT 39.8 34.2*  MCV 88.4 88.4  PLT 236 200   Cardiac Enzymes: No results for input(s): CKTOTAL, CKMB, CKMBINDEX, TROPONINI in the last 168 hours. BNP: Invalid input(s): POCBNP CBG: No results for input(s): GLUCAP in the last 168 hours. D-Dimer No results for input(s): DDIMER in the last 72 hours. Hgb A1c No results for input(s): HGBA1C in the last 72 hours. Lipid Profile No results for input(s): CHOL, HDL, LDLCALC, TRIG, CHOLHDL, LDLDIRECT in the last 72 hours. Thyroid function studies No results for input(s): TSH, T4TOTAL, T3FREE, THYROIDAB in the last 72 hours.  Invalid input(s): FREET3 Anemia work up No results  for input(s): VITAMINB12, FOLATE, FERRITIN, TIBC, IRON, RETICCTPCT in the last 72 hours. Urinalysis    Component Value Date/Time   COLORURINE YELLOW 12/05/2017 0025   APPEARANCEUR CLOUDY (A) 12/05/2017 0025   LABSPEC 1.015 12/05/2017 0025   PHURINE 5.0 12/05/2017 0025   GLUCOSEU NEGATIVE 12/05/2017 0025   HGBUR NEGATIVE 12/05/2017 0025   BILIRUBINUR NEGATIVE 12/05/2017 0025   KETONESUR 20 (A) 12/05/2017 0025   PROTEINUR 30 (A) 12/05/2017 0025   NITRITE NEGATIVE 12/05/2017 0025   LEUKOCYTESUR NEGATIVE 12/05/2017 0025   Sepsis Labs Invalid input(s): PROCALCITONIN,  WBC,  LACTICIDVEN Microbiology No results found for this or any previous visit (from the past 240 hour(s)).  Please note: You were cared for by a hospitalist during your hospital stay. Once you are discharged, your primary care physician will handle any further medical issues. Please note that NO REFILLS for any discharge medications will be authorized once you are discharged, as it is imperative that you return to your primary care physician (or establish a relationship with a primary care physician if you do not have one) for your post hospital discharge needs so that they can reassess  your need for medications and monitor your lab values.    Time coordinating discharge: 40 minutes  SIGNED:   Shelly Coss, MD  [facility] 12/06/2017, 10:28 AM Pager [phone]  If 7PM-7AM, please contact night-coverage [email or web address] Password [email or web address]

## 2017-12-19 DIAGNOSIS — E871 Hypo-osmolality and hyponatremia: Secondary | ICD-10-CM | POA: Diagnosis not present

## 2017-12-19 DIAGNOSIS — R911 Solitary pulmonary nodule: Secondary | ICD-10-CM | POA: Diagnosis not present

## 2017-12-19 DIAGNOSIS — N189 Chronic kidney disease, unspecified: Secondary | ICD-10-CM | POA: Diagnosis not present

## 2017-12-19 DIAGNOSIS — M545 Low back pain: Secondary | ICD-10-CM | POA: Diagnosis not present

## 2017-12-19 DIAGNOSIS — I10 Essential (primary) hypertension: Secondary | ICD-10-CM | POA: Diagnosis not present

## 2017-12-21 ENCOUNTER — Ambulatory Visit (HOSPITAL_COMMUNITY)
Admission: RE | Admit: 2017-12-21 | Discharge: 2017-12-21 | Disposition: A | Discharge status: Home / Self Care | Payer: Medicare HMO | Source: Ambulatory Visit | Attending: Family Medicine | Admitting: Family Medicine

## 2017-12-21 DIAGNOSIS — C3490 Malignant neoplasm of unspecified part of unspecified bronchus or lung: Secondary | ICD-10-CM | POA: Diagnosis not present

## 2017-12-21 DIAGNOSIS — R911 Solitary pulmonary nodule: Secondary | ICD-10-CM | POA: Diagnosis not present

## 2017-12-21 DIAGNOSIS — R6889 Other general symptoms and signs: Secondary | ICD-10-CM | POA: Diagnosis not present

## 2017-12-21 LAB — GLUCOSE, CAPILLARY: Glucose-Capillary: 103 mg/dL — ABNORMAL HIGH (ref 70–99)

## 2017-12-21 MED ORDER — FLUDEOXYGLUCOSE F - 18 (FDG) INJECTION
8.4000 | Freq: Once | INTRAVENOUS | Status: AC | PRN
  Administered 2017-12-21: 8.4 via INTRAVENOUS

## 2018-01-03 ENCOUNTER — Encounter: Payer: Self-pay | Admitting: *Deleted

## 2018-01-03 NOTE — Progress Notes (Signed)
Oncology Nurse Navigator Documentation  Oncology Nurse Navigator Flowsheets 01/03/2018  Navigator Location [facility]  Referral date to RadOnc/MedOnc 01/03/2018  Navigator Encounter Type Other/I received referral on Mr. Burdi today.  He was seen in the hospital by Dr. Roxy Manns recently.  I contacted his office to get clarification on next steps.  Patient has a follow up ct scan and then will see Dr. Roxy Manns for follow up.  TCTS will update me if patient needs to be seen with Rad or Med onc.   Treatment Phase Abnormal Scans  Barriers/Navigation Needs Coordination of Care  Interventions Coordination of Care  Coordination of Care Other  Acuity Level 2  Time Spent with Patient 30

## 2018-01-07 ENCOUNTER — Other Ambulatory Visit: Payer: Medicare HMO

## 2018-01-07 ENCOUNTER — Encounter: Payer: Self-pay | Admitting: *Deleted

## 2018-01-07 ENCOUNTER — Ambulatory Visit: Payer: Medicare HMO | Admitting: Thoracic Surgery (Cardiothoracic Vascular Surgery)

## 2018-01-07 NOTE — Progress Notes (Signed)
Oncology Nurse Navigator Documentation  Oncology Nurse Navigator Flowsheets 01/07/2018  Navigator Location [facility]  Navigator Encounter Type Other/I received referral on Mr. Shuford but was followed by Dr. Roxy Manns.  I contacted his office to see if he would like [place] to see patient or if he would like to follow.  He will follow patient and update [place] if he needs to be seen with [place]. I updated new patient coordinator.   Treatment Phase Abnormal Scans  Barriers/Navigation Needs Coordination of Care  Interventions Coordination of Care  Coordination of Care Other  Acuity Level 2  Time Spent with Patient 30

## 2018-01-21 ENCOUNTER — Other Ambulatory Visit: Payer: Self-pay

## 2018-01-28 ENCOUNTER — Ambulatory Visit: Payer: Self-pay | Admitting: Thoracic Surgery (Cardiothoracic Vascular Surgery)

## 2018-01-30 ENCOUNTER — Other Ambulatory Visit: Payer: Self-pay

## 2018-01-31 ENCOUNTER — Ambulatory Visit: Payer: Self-pay | Admitting: Thoracic Surgery (Cardiothoracic Vascular Surgery)

## 2018-02-18 ENCOUNTER — Ambulatory Visit
Admission: RE | Admit: 2018-02-18 | Discharge: 2018-02-18 | Disposition: A | Discharge status: Home / Self Care | Payer: Medicare HMO | Source: Ambulatory Visit | Attending: Thoracic Surgery (Cardiothoracic Vascular Surgery) | Admitting: Thoracic Surgery (Cardiothoracic Vascular Surgery)

## 2018-02-18 ENCOUNTER — Ambulatory Visit: Payer: Self-pay | Admitting: Thoracic Surgery (Cardiothoracic Vascular Surgery)

## 2018-02-18 DIAGNOSIS — R911 Solitary pulmonary nodule: Secondary | ICD-10-CM

## 2018-02-18 MED ORDER — IOPAMIDOL (ISOVUE-300) INJECTION 61%
75.0000 mL | Freq: Once | INTRAVENOUS | Status: AC | PRN
  Administered 2018-02-18: 75 mL via INTRAVENOUS

## 2018-02-19 ENCOUNTER — Telehealth: Payer: Self-pay | Admitting: *Deleted

## 2018-02-19 DIAGNOSIS — R911 Solitary pulmonary nodule: Secondary | ICD-10-CM

## 2018-02-19 NOTE — Telephone Encounter (Signed)
Oncology Nurse Navigator Documentation  Oncology Nurse Navigator Flowsheets 02/19/2018  Navigator Location [facility]  Navigator Encounter Type Telephone/I called patient to update him on his referral and he will get an appt with rad onc.  He was very upset with me trying to arrange the appt.  I will make the referral to rad onc for their team to call and schedule him.    Telephone Incoming Call;Outgoing Call  Treatment Phase Pre-Tx/Tx Discussion  Barriers/Navigation Needs Education;Coordination of Care  Education Other  Interventions Coordination of Care;Education  Coordination of Care Other  Education Method Verbal  Acuity Level 2  Time Spent with Patient 30

## 2018-02-19 NOTE — Telephone Encounter (Signed)
Oncology Nurse Navigator Documentation  Oncology Nurse Navigator Flowsheets 02/19/2018  Navigator Location [facility]  Navigator Encounter Type Telephone/I received a call back from Terry Page.  He was slurring his words and I interrupting me.   I reminded that I called him to let him know he would get a call from [place] for an appt.    Telephone Incoming Call  Treatment Phase Pre-Tx/Tx Discussion  Barriers/Navigation Needs Education  Education Other  Interventions Education  Education Method Verbal  Acuity Level 1  Time Spent with Patient 15

## 2018-02-19 NOTE — Telephone Encounter (Signed)
Oncology Nurse Navigator Documentation  Oncology Nurse Navigator Flowsheets 02/19/2018  Navigator Location [facility]  Referral date to RadOnc/MedOnc 02/18/2018  Navigator Encounter Type Telephone/I received referral on Terry Page from Dr. Roxy Manns.  I called Terry Page to schedule for [name] this week.  I was unable to reach but did leave a vm message with my name and phone number to call.    Telephone Outgoing Call  Treatment Phase Pre-Tx/Tx Discussion  Barriers/Navigation Needs Education;Coordination of Care  Education Other  Interventions Coordination of Care;Education  Coordination of Care Other  Education Method Verbal  Acuity Level 1  Time Spent with Patient 15

## 2018-02-27 ENCOUNTER — Encounter: Payer: Self-pay | Admitting: *Deleted

## 2018-02-27 NOTE — Progress Notes (Signed)
Oncology Nurse Navigator Documentation  Oncology Nurse Navigator Flowsheets 02/27/2018  Navigator Location [facility]  Navigator Encounter Type Other/I followed up on Terry Page appt.  I did not see an appt with Rad Onc so I reached out to [place] scheduling to see about the appt.    Treatment Phase Pre-Tx/Tx Discussion  Barriers/Navigation Needs Coordination of Care  Interventions Coordination of Care  Coordination of Care Other  Acuity Level 1  Time Spent with Patient 15

## 2018-03-11 ENCOUNTER — Other Ambulatory Visit: Payer: Self-pay | Admitting: *Deleted

## 2018-03-11 ENCOUNTER — Encounter: Payer: Self-pay | Admitting: Thoracic Surgery (Cardiothoracic Vascular Surgery)

## 2018-03-11 ENCOUNTER — Ambulatory Visit (INDEPENDENT_AMBULATORY_CARE_PROVIDER_SITE_OTHER): Payer: Self-pay | Admitting: Thoracic Surgery (Cardiothoracic Vascular Surgery)

## 2018-03-11 ENCOUNTER — Telehealth: Payer: Self-pay | Admitting: *Deleted

## 2018-03-11 ENCOUNTER — Ambulatory Visit: Payer: Self-pay | Admitting: Thoracic Surgery (Cardiothoracic Vascular Surgery)

## 2018-03-11 VITALS — BP 128/90 | HR 112 | Resp 16 | Ht 69.0 in | Wt 155.0 lb

## 2018-03-11 DIAGNOSIS — Z9889 Other specified postprocedural states: Secondary | ICD-10-CM

## 2018-03-11 DIAGNOSIS — R911 Solitary pulmonary nodule: Secondary | ICD-10-CM

## 2018-03-11 DIAGNOSIS — D381 Neoplasm of uncertain behavior of trachea, bronchus and lung: Secondary | ICD-10-CM

## 2018-03-11 NOTE — Patient Instructions (Signed)
Continue all previous medications without any changes at this time  

## 2018-03-11 NOTE — Telephone Encounter (Signed)
Oncology Nurse Navigator Documentation  Oncology Nurse Navigator Flowsheets 03/11/2018  Navigator Location [facility]  Referral date to RadOnc/MedOnc -  Navigator Encounter Type Telephone/I received referral on Mr. Younis today.  I called to schedule him to be seen at [facility] but was unable to reach him.  I did leave a vm message with my name and phone number to call.   Telephone Outgoing Call  Treatment Phase Pre-Tx/Tx Discussion  Barriers/Navigation Needs Education;Coordination of Care  Education Other  Interventions Education  Coordination of Care Other  Education Method Verbal  Acuity Level 2  Time Spent with Patient 15

## 2018-03-11 NOTE — Progress Notes (Signed)
[address]       [place] [address]             [phone]     CARDIOTHORACIC SURGERY OFFICE NOTE  Referring Provider is Lacretia Leigh, MD Primary Cardiologist is No primary care provider on file. PCP is [name], MD   HPI:  Patient is a 63 year old African-American male with history of bullous emphysema and tobacco abuse who presented with a right primary spontaneous pneumothorax October 2019.  He was successfully treated with chest tube placement and his pneumothorax has not recurred.  Chest CT scan performed at the time of his index hospitalization revealed a small spiculated nodule in the left upper lobe which persisted on follow-up imaging and has since then been demonstrated to be hypermetabolic on PET imaging.  Attempts to get the patient scheduled for evaluation in the [facility] have been unsuccessful.  The patient returns to our office today for follow-up.  He successfully quit smoking at the time of his spontaneous pneumothorax.  He reports no significant problems or complaints.  He specifically denies any symptoms of shortness of breath, chest pain, productive cough, or hemoptysis.  Current Outpatient Medications  Medication Sig Dispense Refill  . amLODipine (NORVASC) 10 MG tablet Take 1 tablet (10 mg total) by mouth daily. 30 tablet 0  . docusate sodium (COLACE) 100 MG capsule Take 100 mg by mouth 2 (two) times daily.    . naproxen sodium (ALEVE) 220 MG tablet Take 220 mg by mouth daily as needed (back pain).    [name] olmesartan (BENICAR) 40 MG tablet Take 40 mg by mouth daily.  1   No current facility-administered medications for this visit.       Physical Exam:   BP 128/90 (BP Location: Right Arm, Patient Position: Sitting, Cuff Size: Normal)   Pulse (!) 112   Resp 16   Ht 5\' 9"  (1.753 m)   Wt 155 lb (70.3 kg)   SpO2 95% Comment: ON RA  BMI 22.89 kg/m   General:  Well-appearing  Chest:   Distant breath sounds but clear  CV:   Regular  rate and rhythm  Incisions:  n/a  Abdomen:  Soft nontender  Extremities:  Warm and well-perfused  Diagnostic Tests:  NUCLEAR MEDICINE PET SKULL BASE TO THIGH  TECHNIQUE: 8.4 mCi F-18 FDG was injected intravenously. Full-ring PET imaging was performed from the skull base to thigh after the radiotracer. CT data was obtained and used for attenuation correction and anatomic localization.  Fasting blood glucose: 103 mg/dl  COMPARISON:  Chest CTA on 10/22/2017  FINDINGS: (Background mediastinal blood pool activity: SUV max = 1.9)  NECK:  No hypermetabolic lymph nodes or masses.  Incidental CT findings:  None.  CHEST: No hypermetabolic lymph nodes within the thorax. 11 mm spiculated nodule in the posterior, inferior left upper lobe shows hypermetabolic activity, with SUV max of 8.0. No other suspicious pulmonary nodules are seen on CT images. Bullous emphysema is again demonstrated. No residual pneumothorax seen. No evidence of pleural effusion.  Incidental CT findings:  Aortic and coronary artery atherosclerosis.  ABDOMEN/PELVIS: No abnormal hypermetabolic activity within the liver, pancreas, adrenal glands, or spleen. No hypermetabolic lymph nodes in the abdomen or pelvis.  Incidental CT findings: Left extrarenal pelvis. Fluid attenuation right renal cyst. Aortic atherosclerosis. Previous hysterectomy.  SKELETON: No focal hypermetabolic bone lesions to suggest skeletal metastasis.  Incidental CT findings:  None.  IMPRESSION: Hypermetabolic 11 mm spiculated nodule in the left  upper lobe, highly suspicious for bronchogenic carcinoma.  No evidence of thoracic lymph node or distant metastatic disease.  Aortic Atherosclerosis (ICD10-I70.0) and Emphysema (ICD10-J43.9). Coronary arterial atherosclerosis.   Electronically Signed   By: Earle Gell M.D.   On: 12/21/2017 16:27   CT CHEST WITH CONTRAST  TECHNIQUE: Multidetector CT imaging of the chest  was performed during intravenous contrast administration.  Creatinine was obtained on site at [facility] at [address]  Results: Creatinine 0.6 mg/dL.  CONTRAST:  87mL ISOVUE-300 IOPAMIDOL (ISOVUE-300) INJECTION 61%  COMPARISON:  PET-CT, 12/21/2017.  Chest CT, 10/22/2017.  FINDINGS: Cardiovascular: Cardiac silhouette is normal in size and configuration. No pericardial effusion. Three-vessel coronary artery calcifications. Prominent main, right and left pulmonary arteries, stable. Aorta normal in caliber. Mild aortic atherosclerosis. Atherosclerosis the origin of the arch branch vessels with mild, approximately 40%, narrowing of the left subclavian artery.  Mediastinum/Nodes: No neck base or axillary masses or enlarged lymph nodes. No mediastinal or hilar masses pathologically enlarged lymph nodes. Trachea and esophagus are unremarkable.  Lungs/Pleura: Spiculated nodule, spiculated nodule, left upper lobe posteriorly, near the oblique fissure, centered on image 68, series 8, currently measures 12 x 11 x 12 mm, slightly larger than the prior PET-CT and larger than prior chest radiograph 1-2 mm in each dimension.  Small spiculated 5 mm nodule, left upper lobe, image 48, series 8, similar to the prior exams, likely scarring.  No other nodules. There are extensive changes of bullous and centrilobular emphysema that are stable. No evidence of pneumonia or pulmonary edema. No pleural effusion or pneumothorax.  Upper Abdomen: No acute findings.  Small lobulated spleen, stable.  Musculoskeletal: No fracture or acute finding. No osteoblastic or osteolytic lesions.  IMPRESSION: 1. Spiculated left upper lobe nodule, hypermetabolic on the prior PET-CT, has mildly enlarged most evident compared to the chest CT dated 10/22/2017, measuring 10 mm on that exam, currently 12 mm. This is likely carcinoma. 2. 5 mm nodule in the left upper lobe adjacent to  the bronchovascular structures, stable, likely scarring. 3. No other nodules. 4. No acute findings. 5. Extensive emphysema. Coronary artery and aortic atherosclerotic calcifications. Prominent pulmonary arteries suggesting pulmonary hypertension.  Aortic Atherosclerosis (ICD10-I70.0) and Emphysema (ICD10-J43.9).   Electronically Signed   By: Lajean Manes M.D.   On: 02/18/2018 13:03    Impression:  Small spiculated mass in the left upper lobe that is hypermetabolic on PET imaging in high least suspicious for primary lung cancer in the setting of underlying severe bullous emphysema and remote tobacco use.  Patient needs to be evaluated in the multidisciplinary thoracic oncology clinic as soon as possible to discuss treatment options including high risk primary lung resection versus less invasive attempts to establish tissue diagnosis followed by possible SBRT   Plan:  I discussed the results with the patient's multiple previous diagnostic tests at length with the patient in the office today.  I have emphasized the fact that the patient small lung nodule is almost certainly lung cancer.  I emphasized the fact that with early stage disease there is a very good chance he could be cured.  The highly lethal nature of untreated lung cancer has been discussed.  I have congratulated the patient on his ability to have previously quit smoking.  The patient has agreed to undergo pulmonary function testing and be evaluated in the [facility] later this week.  All questions answered.    I spent in excess of 15 minutes during the conduct of this office  consultation and >50% of this time involved direct face-to-face encounter with the patient for counseling and/or coordination of their care.    Valentina Gu. Roxy Manns, MD 03/11/2018 9:23 AM

## 2018-03-12 ENCOUNTER — Ambulatory Visit (HOSPITAL_COMMUNITY)
Admission: RE | Admit: 2018-03-12 | Discharge: 2018-03-12 | Disposition: A | Discharge status: Home / Self Care | Payer: Medicare HMO | Source: Ambulatory Visit | Attending: Thoracic Surgery (Cardiothoracic Vascular Surgery) | Admitting: Thoracic Surgery (Cardiothoracic Vascular Surgery)

## 2018-03-12 ENCOUNTER — Telehealth: Payer: Self-pay | Admitting: *Deleted

## 2018-03-12 DIAGNOSIS — R911 Solitary pulmonary nodule: Secondary | ICD-10-CM | POA: Insufficient documentation

## 2018-03-12 LAB — PULMONARY FUNCTION TEST
DL/VA % pred: 62 %
DL/VA: 2.59 ml/min/mmHg/L
DLCO unc % pred: 39 %
DLCO unc: 10.43 ml/min/mmHg
FEF 25-75 Post: 0.73 L/sec
FEF 25-75 Pre: 0.48 L/sec
FEF2575-%Change-Post: 52 %
FEF2575-%Pred-Post: 27 %
FEF2575-%Pred-Pre: 18 %
FEV1-%Change-Post: 15 %
FEV1-%Pred-Post: 50 %
FEV1-%Pred-Pre: 43 %
FEV1-Post: 1.46 L
FEV1-Pre: 1.27 L
FEV1FVC-%Change-Post: 0 %
FEV1FVC-%Pred-Pre: 65 %
FEV6-%Change-Post: 12 %
FEV6-%Pred-Post: 70 %
FEV6-%Pred-Pre: 62 %
FEV6-Post: 2.58 L
FEV6-Pre: 2.28 L
FEV6FVC-%Change-Post: -2 %
FEV6FVC-%Pred-Post: 93 %
FEV6FVC-%Pred-Pre: 95 %
FVC-%Change-Post: 15 %
FVC-%Pred-Post: 75 %
FVC-%Pred-Pre: 65 %
FVC-Post: 2.87 L
FVC-Pre: 2.49 L
Post FEV1/FVC ratio: 51 %
Post FEV6/FVC ratio: 90 %
Pre FEV1/FVC ratio: 51 %
Pre FEV6/FVC Ratio: 92 %
RV % pred: 150 %
RV: 3.43 L
TLC % pred: 87 %
TLC: 5.95 L

## 2018-03-12 MED ORDER — ALBUTEROL SULFATE (2.5 MG/3ML) 0.083% IN NEBU
2.5000 mg | INHALATION_SOLUTION | Freq: Once | RESPIRATORY_TRACT | Status: AC
  Administered 2018-03-12: 2.5 mg via RESPIRATORY_TRACT

## 2018-03-12 NOTE — Telephone Encounter (Signed)
Oncology Nurse Navigator Documentation  Oncology Nurse Navigator Flowsheets 03/12/2018  Navigator Location [facility]  Navigator Encounter Type Telephone/I received a call from Terry Page. I scheduled him to be seen at [facility] next week.  He verbalized understanding of appt time and place.   Telephone Outgoing Call  Treatment Phase Pre-Tx/Tx Discussion  Barriers/Navigation Needs Education;Coordination of Care  Education Other  Interventions Coordination of Care;Education  Coordination of Care Appts  Education Method Verbal  Acuity Level 1  Time Spent with Patient 30

## 2018-03-21 ENCOUNTER — Other Ambulatory Visit: Payer: Self-pay | Admitting: *Deleted

## 2018-03-21 ENCOUNTER — Ambulatory Visit
Admission: RE | Admit: 2018-03-21 | Discharge: 2018-03-21 | Disposition: A | Discharge status: Home / Self Care | Payer: Medicare HMO | Source: Ambulatory Visit | Attending: Radiation Oncology | Admitting: Radiation Oncology

## 2018-03-21 ENCOUNTER — Encounter: Payer: Self-pay | Admitting: *Deleted

## 2018-03-21 ENCOUNTER — Other Ambulatory Visit: Payer: Self-pay

## 2018-03-21 VITALS — BP 153/99 | HR 79 | Temp 97.8°F | Resp 20 | Wt 156.4 lb

## 2018-03-21 DIAGNOSIS — R911 Solitary pulmonary nodule: Secondary | ICD-10-CM

## 2018-03-21 NOTE — Progress Notes (Signed)
The proposed treatment discussed in conference conference 03/21/2018 is for discussion purpose only and is not a binding recommendation.  The patient was not physically examined nor present for their treatment options.  Therefore, final treatment plans cannot be decided.

## 2018-03-21 NOTE — Progress Notes (Signed)
Oncology Nurse Navigator Documentation  Oncology Nurse Navigator Flowsheets 03/21/2018  [facility] Location [facility]  Referral date to RadOnc/MedOnc -  Navigator Encounter Type Clinic/MDC/I spoke with patient at thoracic clinic today.  He will be receiving radiation treatment.  I gave and explained information on lung cancer and treatment.    Telephone -  Abnormal Finding Date 12/21/2017  Multidisiplinary Clinic Date 03/21/2018  Patient Visit Type MedOnc  Treatment Phase Pre-Tx/Tx Discussion  Barriers/Navigation Needs Education  Education Understanding Cancer/ Treatment Options;Newly Diagnosed Cancer Education;Other  Interventions Education  Coordination of Care -  Education Method Verbal;Written  Acuity Level 2  Time Spent with Patient 30

## 2018-03-21 NOTE — Progress Notes (Signed)
Radiation Oncology         [phone] ________________________________  Multidisciplinary Thoracic Oncology Clinic [facility]) Initial Outpatient Consultation  Name: Terry Page. MRN: [id number]  Date: 03/21/2018  DOB: 16-Jul-1955  MW:NUUVOZ, Apolonio Schneiders, MD  Rexene Alberts, MD   REFERRING PHYSICIAN: Rexene Alberts, MD  DIAGNOSIS: PET positive solitary nodule presenting in the left upper lobe    ICD-10-CM   1. Incidental lung nodule, greater than or equal to 37mm R91.1     HISTORY OF PRESENT ILLNESS::Terry Page. is a 63 y.o. male who is here at the request of Dr. Roxy Manns for a left upper lobe lung nodule.   The patient has a history of bullous emphysema and tobacco abuse. He presented to the emergency department with shortness of breath in October 2019 and was found to have a right primary spontaneous pneumothorax. He was successfully treated with chest tube placement, and his pneumothorax has not recurred. CT angiography of the chest performed at the time of his hospitalization identified a small, spiculated 10 mm left upper lobe lung nodule near the hilum, highly suspicious for bronchogenic carcinoma. He has subsequently been followed by Dr. Roxy Manns for the LUL nodule. The patient did successfully quit smoking at the time of his spontaneous pneumothorax.   The patient underwent a PET scan on 12/21/2017 which showed the spiculated nodule in the left upper lobe, measuring 11 mm, hypermetabolic with SUV max of 8.0. There was no evidence of thoracic lymph node or distant metastatic disease.  A follow-up CT scan of the chest on 02/18/2018 showed the spiculated left upper lobe nodule has mildly enlarged to 12 mm. There was also a 5 mm nodule in the left upper lobe adjacent to the bronchovascular structures, stable, likely scarring. No other nodules or acute findings.  Attempts to get the patient scheduled for evaluation in the [place] have been unsuccessful. The patient returned to Dr. Guy Sandifer  office on 03/11/2018 for follow-up and was referred today for presentation in the multidisciplinary conference.  Radiology studies and pathology slides were presented there for review and discussion of treatment options.  A consensus was discussed regarding potential next steps.  On review of systems, the patient denies hemoptysis or chest pain.  PREVIOUS RADIATION THERAPY: No  PAST MEDICAL HISTORY:  has a past medical history of Alcohol abuse, Bullous emphysema ([place]), Hemorrhoids, Incidental lung nodule, greater than or equal to 44mm (10/22/2017), Spontaneous pneumothorax (10/20/2017), and Tobacco abuse.    PAST SURGICAL HISTORY: Past Surgical History:  Procedure Laterality Date  . BACK SURGERY      FAMILY HISTORY: family history is not on file.  SOCIAL HISTORY:  reports that he quit smoking about 5 months ago. His smoking use included cigarettes. He smoked 0.50 packs per day. He has never used smokeless tobacco. He reports current alcohol use of about 12.0 standard drinks of alcohol per week. He reports that he does not use drugs.  ALLERGIES: Patient has no known allergies.  MEDICATIONS:  Current Outpatient Medications  Medication Sig Dispense Refill  . amLODipine (NORVASC) 10 MG tablet Take 1 tablet (10 mg total) by mouth daily. 30 tablet 0  . docusate sodium (COLACE) 100 MG capsule Take 100 mg by mouth 2 (two) times daily.    . naproxen sodium (ALEVE) 220 MG tablet Take 220 mg by mouth daily as needed (back pain).    [name] olmesartan (BENICAR) 40 MG tablet Take 40 mg by mouth daily.  1   No current facility-administered  medications for this encounter.     REVIEW OF SYSTEMS:  All pertinent positives and negatives are noted in the HPI.   PHYSICAL EXAM:  weight is 156 lb 6.4 oz (70.9 kg). His temperature is 97.8 F (36.6 C). His blood pressure is 153/99 (abnormal) and his pulse is 79. His respiration is 20 and oxygen saturation is 100%.   General: Alert and oriented, in no acute  distress. HEENT: Head is normocephalic. Extraocular movements are intact. Oropharynx is clear. Neck: Neck is supple, no palpable cervical or supraclavicular lymphadenopathy. Heart: Regular in rate and rhythm with no murmurs, rubs, or gallops. Chest: Clear to auscultation bilaterally, with no rhonchi, wheezes, or rales. Abdomen: Soft, nontender, nondistended, with no rigidity or guarding. Extremities: No cyanosis or edema. Lymphatics: see Neck Exam Skin: No concerning lesions. Musculoskeletal: Symmetric strength and muscle tone throughout. Neurologic: Cranial nerves II through XII are grossly intact. No obvious focalities. Speech is fluent. Coordination is intact. Psychiatric: Judgment and insight are intact. Affect is appropriate.   KPS = 90  100 - Normal; no complaints; no evidence of disease. 90   - Able to carry on normal activity; minor signs or symptoms of disease. 80   - Normal activity with effort; some signs or symptoms of disease. 8   - Cares for self; unable to carry on normal activity or to do active work. 60   - Requires occasional assistance, but is able to care for most of his personal needs. 50   - Requires considerable assistance and frequent medical care. 40   - Disabled; requires special care and assistance. 58   - Severely disabled; hospital admission is indicated although death not imminent. 61   - Very sick; hospital admission necessary; active supportive treatment necessary. 10   - Moribund; fatal processes progressing rapidly. 0     - Dead  Karnofsky DA, Abelmann Belva, Craver LS and Burchenal [facility] [phone]) The use of the nitrogen mustards in the palliative treatment of carcinoma: with particular reference to bronchogenic carcinoma Cancer 1 [phone]  LABORATORY DATA:  Lab Results  Component Value Date   WBC 8.6 12/05/2017   HGB 11.6 (L) 12/05/2017   HCT 34.2 (L) 12/05/2017   MCV 88.4 12/05/2017   PLT 200 12/05/2017   Lab Results  Component Value Date   NA 126  (L) 12/06/2017   K 4.2 12/06/2017   CL 97 (L) 12/06/2017   CO2 21 (L) 12/06/2017   No results found for: ALT, AST, GGT, ALKPHOS, BILITOT  PULMONARY FUNCTION TEST:   Recent Review Flowsheet Data    Spirometry Latest Ref Rng & Units 03/12/2018   FVC-%PRED-PRE % 65   FVC-%PRED-POST % 75   FEV1-PRE L 1.27   FEV1-%PRED-PRE % 43   FEV1-POST L 1.46   FEV1-%PRED-POST % 50   DLCO [facility] ml/min/mmHg 10.43      RADIOGRAPHY: No results found.     IMPRESSION: PET positive solitary nodule presenting in the left upper lobe  The patient is a good candidate for stereotactic body radiotherapy (SBRT). The patient has extensive changes of bullous and centrilobular emphysema within the lung, and he is not felt to be a good candidate for surgery. We talked with the patient today about consideration for CT-guided biopsy, but given the risk with this procedure and the fact that the patient has already had one pneumothorax, he does not wish to consider biopsy of the lesion. The patient feels most comfortable with proceeding with radiation without tissue diagnosis.  Today,  I talked to the patient about the findings and work-up thus far.  We discussed the natural history of lung cancer and general treatment, highlighting the role of SBRT in the management.  We discussed the available radiation techniques, and focused on the details of logistics and delivery.  We reviewed the anticipated acute and late sequelae associated with radiation in this setting.  The patient was encouraged to ask questions that I answered to the best of my ability.  A patient consent form was discussed and signed.  We retained a copy for our records.  The patient would like to proceed with radiation and will be scheduled for CT simulation.  PLAN: CT simulation scheduled for Wednesday April 1st. Anticipate 3-5 fractions of SBRT directed at the solitary left upper lobe lung nodule.   ------------------------------------------------  Blair Promise, PhD, MD  This document serves as a record of services personally performed by Gery Pray, MD. It was created on his behalf by Rae Lips, a trained medical scribe. The creation of this record is based on the scribe's personal observations and the provider's statements to them. This document has been checked and approved by the attending provider.

## 2018-04-03 ENCOUNTER — Other Ambulatory Visit: Payer: Self-pay

## 2018-04-03 ENCOUNTER — Ambulatory Visit
Admission: RE | Admit: 2018-04-03 | Discharge: 2018-04-03 | Disposition: A | Discharge status: Home / Self Care | Payer: Medicare HMO | Source: Ambulatory Visit | Attending: Radiation Oncology | Admitting: Radiation Oncology

## 2018-04-03 DIAGNOSIS — Z510 Encounter for antineoplastic radiation therapy: Secondary | ICD-10-CM | POA: Insufficient documentation

## 2018-04-03 DIAGNOSIS — R911 Solitary pulmonary nodule: Secondary | ICD-10-CM

## 2018-04-03 NOTE — Progress Notes (Signed)
  Radiation Oncology         [phone]) [phone] ________________________________  Name: Terry Page. MRN: [id number]  Date: 04/03/2018  DOB: [birth year]   STEREOTACTIC BODY RADIOTHERAPY SIMULATION AND TREATMENT PLANNING NOTE   DIAGNOSIS:  PET positive solitary nodule presenting in the left upper lobe  NARRATIVE:  The patient was brought to the [place].  Identity was confirmed.  All relevant records and images related to the planned course of therapy were reviewed.  The patient freely provided informed written consent to proceed with treatment after reviewing the details related to the planned course of therapy. The consent form was witnessed and verified by the simulation staff.  Then, the patient was set-up in a stable reproducible  supine position for radiation therapy.  A BodyFix immobilization pillow was fabricated for reproducible positioning.  Then I personally applied the abdominal compression paddle to limit respiratory excursion.  4D respiratoy motion management CT images were obtained.  Surface markings were placed.  The CT images were loaded into the planning software.  Then, using Cine, MIP, and standard views, the internal target volume (ITV) and planning target volumes (PTV) were delinieated, and avoidance structures were contoured.  Treatment planning then occurred.  The radiation prescription was entered and confirmed.  A total of two complex treatment devices were fabricated in the form of the BodyFix immobilization pillow and a neck accuform cushion.  I have requested : 3D Simulation  I have requested a DVH of the following structures: Heart, Lungs, Esophagus, Chest Wall, Brachial Plexus, Major Blood Vessels, and targets.  PLAN:  The patient will receive 54 Gy in 3 fractions.  -----------------------------------  Blair Promise, PhD, MD This document serves as a record of services personally performed by Gery Pray, MD. It was created on his behalf by  Mary-Margaret Loma Messing, a trained medical scribe. The creation of this record is based on the scribe's personal observations and the provider's statements to them. This document has been checked and approved by the attending provider.

## 2018-04-10 ENCOUNTER — Ambulatory Visit
Admission: RE | Admit: 2018-04-10 | Discharge: 2018-04-10 | Disposition: A | Discharge status: Home / Self Care | Payer: Medicare HMO | Source: Ambulatory Visit | Attending: Radiation Oncology | Admitting: Radiation Oncology

## 2018-04-10 ENCOUNTER — Other Ambulatory Visit: Payer: Self-pay

## 2018-04-10 DIAGNOSIS — Z510 Encounter for antineoplastic radiation therapy: Secondary | ICD-10-CM | POA: Diagnosis not present

## 2018-04-11 ENCOUNTER — Ambulatory Visit: Payer: Medicare HMO | Admitting: Radiation Oncology

## 2018-04-12 ENCOUNTER — Other Ambulatory Visit: Payer: Self-pay

## 2018-04-12 ENCOUNTER — Ambulatory Visit
Admission: RE | Admit: 2018-04-12 | Discharge: 2018-04-12 | Disposition: A | Discharge status: Home / Self Care | Payer: Medicare HMO | Source: Ambulatory Visit | Attending: Radiation Oncology | Admitting: Radiation Oncology

## 2018-04-12 DIAGNOSIS — Z510 Encounter for antineoplastic radiation therapy: Secondary | ICD-10-CM | POA: Diagnosis not present

## 2018-04-15 ENCOUNTER — Encounter: Payer: Self-pay | Admitting: Radiation Oncology

## 2018-04-15 ENCOUNTER — Ambulatory Visit
Admission: RE | Admit: 2018-04-15 | Discharge: 2018-04-15 | Disposition: A | Discharge status: Home / Self Care | Payer: Medicare HMO | Source: Ambulatory Visit | Attending: Radiation Oncology | Admitting: Radiation Oncology

## 2018-04-15 ENCOUNTER — Other Ambulatory Visit: Payer: Self-pay

## 2018-04-15 DIAGNOSIS — R911 Solitary pulmonary nodule: Secondary | ICD-10-CM

## 2018-04-15 DIAGNOSIS — Z510 Encounter for antineoplastic radiation therapy: Secondary | ICD-10-CM | POA: Diagnosis not present

## 2018-04-15 NOTE — Progress Notes (Signed)
  Radiation Oncology         [phone]) [phone] ________________________________  Name: Terry Page. MRN: [id number]  Date: 04/15/2018  DOB: February 05, 1955  End of Treatment Note  Diagnosis:   PET positive solitary nodule presenting in the left upper lobe     Indication for treatment:  Curative       Radiation treatment dates:   4/1, 4/8, 04/12/2018  Site/dose:   Left Lung / 54 Gy in 3 fractions  Beams/energy:   IMRT, photons / 6X-FFF  Narrative: The patient tolerated radiation treatment relatively well. He denied fatigue and any side effects from radiation.  Plan: The patient has completed radiation treatment. The patient will return to radiation oncology clinic for routine followup in one month. I advised them to call or return sooner if they have any questions or concerns related to their recovery or treatment.  -----------------------------------  Blair Promise, PhD, MD  This document serves as a record of services personally performed by Gery Pray, MD. It was created on his behalf by Wilburn Mylar, a trained medical scribe. The creation of this record is based on the scribe's personal observations and the provider's statements to them. This document has been checked and approved by the attending provider.

## 2018-04-30 NOTE — Progress Notes (Signed)
  Radiation Oncology         [phone]) [phone] ________________________________  Name: Terry Page. MRN: [id number]  Date: 04/15/2018  DOB: 05/30/1955  Stereotactic Body Radiotherapy Treatment Procedure Note  NARRATIVE:  Terry Page. was brought to the stereotactic radiation treatment machine and placed supine on the CT couch. The patient was set up for stereotactic body radiotherapy on the body fix pillow.  3D TREATMENT PLANNING AND DOSIMETRY:  The patient's radiation plan was reviewed and approved prior to starting treatment.  It showed 3-dimensional radiation distributions overlaid onto the planning CT.  The [facility] for the target structures as well as the organs at risk were reviewed. The documentation of this is filed in the radiation oncology EMR.  SIMULATION VERIFICATION:  The patient underwent CT imaging on the treatment unit.  These were carefully aligned to document that the ablative radiation dose would cover the target volume and maximally spare the nearby organs at risk according to the planned distribution.  SPECIAL TREATMENT PROCEDURE: Terry Page. received high dose ablative stereotactic body radiotherapy to the planned target volume without unforeseen complications. Treatment was delivered uneventfully. The high doses associated with stereotactic body radiotherapy and the significant potential risks require careful treatment set up and patient monitoring constituting a special treatment procedure   STEREOTACTIC TREATMENT MANAGEMENT:  Following delivery, the patient was evaluated clinically. The patient tolerated treatment without significant acute effects, and was discharged to home in stable condition.    PLAN: Continue treatment as planned.  ________________________________  Blair Promise, PhD, MD

## 2018-05-22 ENCOUNTER — Telehealth: Payer: Self-pay | Admitting: *Deleted

## 2018-05-22 NOTE — Telephone Encounter (Signed)
CALLED PATIENT TO ALTER FU FOR 05-23-18, PER DR. KINARD, RESCHEDULED FOR 08-26-18 @ 11:30 AM, SPOKE WITH PATIENT AND HE IS GOOD WITH NEW DATE AND TIME

## 2018-05-23 ENCOUNTER — Ambulatory Visit: Payer: Medicare HMO | Admitting: Radiation Oncology

## 2018-08-26 ENCOUNTER — Encounter: Payer: Self-pay | Admitting: Radiation Oncology

## 2018-08-26 ENCOUNTER — Other Ambulatory Visit: Payer: Self-pay

## 2018-08-26 ENCOUNTER — Ambulatory Visit
Admission: RE | Admit: 2018-08-26 | Discharge: 2018-08-26 | Disposition: A | Discharge status: Home / Self Care | Payer: Medicare HMO | Source: Ambulatory Visit | Attending: Radiation Oncology | Admitting: Radiation Oncology

## 2018-08-26 VITALS — BP 128/90 | HR 80 | Temp 98.5°F | Resp 18 | Ht 69.0 in | Wt 165.1 lb

## 2018-08-26 DIAGNOSIS — R911 Solitary pulmonary nodule: Secondary | ICD-10-CM | POA: Diagnosis present

## 2018-08-26 DIAGNOSIS — Z923 Personal history of irradiation: Secondary | ICD-10-CM | POA: Diagnosis not present

## 2018-08-26 DIAGNOSIS — Z79899 Other long term (current) drug therapy: Secondary | ICD-10-CM | POA: Diagnosis not present

## 2018-08-26 NOTE — Patient Instructions (Signed)
Coronavirus (COVID-19) Are you at risk?  Are you at risk for the Coronavirus (COVID-19)?  To be considered HIGH RISK for Coronavirus (COVID-19), you have to meet the following criteria:  . Traveled to [place], [place], [place], [place] or [place]; or in the [place] to [place], [place], [place], or [place]; and have fever, cough, and shortness of breath within the last 2 weeks of travel OR . Been in close contact with a person diagnosed with COVID-19 within the last 2 weeks and have fever, cough, and shortness of breath . IF YOU DO NOT MEET THESE CRITERIA, YOU ARE CONSIDERED LOW RISK FOR COVID-19.  What to do if you are HIGH RISK for COVID-19?  . If you are having a medical emergency, call 911. . Seek medical care right away. Before you go to a doctor's office, urgent care or emergency department, call ahead and tell them about your recent travel, contact with someone diagnosed with COVID-19, and your symptoms. You should receive instructions from your physician's office regarding next steps of care.  . When you arrive at healthcare provider, tell the healthcare staff immediately you have returned from visiting [place], [place], [place], [place] or [place]; or traveled in the [place] to [place], [place], [place], or [place]; in the last two weeks or you have been in close contact with a person diagnosed with COVID-19 in the last 2 weeks.   . Tell the health care staff about your symptoms: fever, cough and shortness of breath. . After you have been seen by a medical provider, you will be either: o Tested for (COVID-19) and discharged home on quarantine except to seek medical care if symptoms worsen, and asked to  - Stay home and avoid contact with others until you get your results (4-5 days)  - Avoid travel on public transportation if possible (such as bus, train, or airplane) or o Sent to the Emergency Department by EMS for evaluation, COVID-19 testing, and possible  admission depending on your condition and test results.  What to do if you are LOW RISK for COVID-19?  Reduce your risk of any infection by using the same precautions used for avoiding the common cold or flu:  . Wash your hands often with soap and warm water for at least 20 seconds.  If soap and water are not readily available, use an alcohol-based hand sanitizer with at least 60% alcohol.  . If coughing or sneezing, cover your mouth and nose by coughing or sneezing into the elbow areas of your shirt or coat, into a tissue or into your sleeve (not your hands). . Avoid shaking hands with others and consider head nods or verbal greetings only. . Avoid touching your eyes, nose, or mouth with unwashed hands.  . Avoid close contact with people who are sick. . Avoid places or events with large numbers of people in one location, like concerts or sporting events. . Carefully consider travel plans you have or are making. . If you are planning any travel outside or inside the [place], visit the CDC's Travelers' Health webpage for the latest health notices. . If you have some symptoms but not all symptoms, continue to monitor at home and seek medical attention if your symptoms worsen. . If you are having a medical emergency, call 911.   ADDITIONAL HEALTHCARE OPTIONS FOR PATIENTS  [facility] / e-Visit: [email or web address]         [facility]: [phone]  [facility]: [phone]                   [facility]: [phone]   

## 2018-08-26 NOTE — Progress Notes (Signed)
Pt presents today for f/u with Dr.Kinard. Pt is upset that he is not having a CT scan today. Pt denies cough, hemoptysis. Pt denies difficulty swallowing. Pt denies SOB. Pt denies c/o pain. Pt is unaware that he will have more than one f/u appt for his condition.   BP 128/90 (BP Location: Left Arm, Patient Position: Sitting)   Pulse 80   Temp 98.5 F (36.9 C) (Temporal)   Resp 18   Ht 5\' 9"  (1.753 m)   Wt 165 lb 2 oz (74.9 kg)   SpO2 99%   BMI 24.38 kg/m   Wt Readings from Last 3 Encounters:  08/26/18 165 lb 2 oz (74.9 kg)  03/21/18 156 lb 6.4 oz (70.9 kg)  03/11/18 155 lb (70.3 kg)   Loma Sousa, RN BSN

## 2018-08-26 NOTE — Progress Notes (Signed)
  Radiation Oncology         [phone]) [phone] ________________________________  Name: Terry Page. MRN: [id number]  Date: 08/26/2018  DOB: [birth year]  Follow-Up Visit Note  CC: [name], MD  [name], MD    ICD-10-CM   1. Incidental lung nodule, greater than or equal to 4mm  R91.1 CT Chest Wo Contrast    Diagnosis:   PET positive solitary nodule presenting in the left upper lobe   Interval Since Last Radiation:  4 months, 2 weeks  4/1, 4/8, 04/12/2018: LUL / 54 Gy in 3 fractions (SBRT)  Narrative:  The patient returns today for routine follow-up. His interval history is generally unremarkable.   On review of systems, he is without complaints. He specifically denies cough, hemoptysis, difficulty swallowing, shortness of breath, and pain.  ALLERGIES:  has No Known Allergies.  Meds: Current Outpatient Medications  Medication Sig Dispense Refill  . amLODipine (NORVASC) 10 MG tablet Take 1 tablet (10 mg total) by mouth daily. 30 tablet 0  . docusate sodium (COLACE) 100 MG capsule Take 100 mg by mouth 2 (two) times daily.    . naproxen sodium (ALEVE) 220 MG tablet Take 220 mg by mouth daily as needed (back pain).    [name] olmesartan (BENICAR) 40 MG tablet Take 40 mg by mouth daily.  1  . sodium chloride 1 g tablet TK 1 T PO TID     No current facility-administered medications for this encounter.     Physical Findings: The patient is in no acute distress. Patient is alert and oriented.  height is 5\' 9"  (1.753 m) and weight is 165 lb 2 oz (74.9 kg). His temporal temperature is 98.5 F (36.9 C). His blood pressure is 128/90 and his pulse is 80. His respiration is 18 and oxygen saturation is 99%. . Lungs are clear to auscultation bilaterally. Heart has regular rate and rhythm. No palpable cervical, supraclavicular, or axillary adenopathy. Abdomen soft, non-tender, normal bowel sounds.  Lab Findings: Lab Results  Component Value Date   WBC 8.6 12/05/2017   HGB 11.6 (L)  12/05/2017   HCT 34.2 (L) 12/05/2017   MCV 88.4 12/05/2017   PLT 200 12/05/2017    Radiographic Findings: No results found.  Impression:  PET positive solitary nodule presenting in the left upper lobe, s/p stereotactic body radiation therapy.  Clinically the patient is doing well at this time.  Plan: Patient will be scheduled for a CT scan of the chest in the next 1 to 2 weeks for follow-up of his SBRT radiation therapy.  Routine follow-up in radiation oncology in 6 months otherwise..  ____________________________________ Gery Pray, MD   This document serves as a record of services personally performed by Gery Pray, MD. It was created on his behalf by Wilburn Mylar, a trained medical scribe. The creation of this record is based on the scribe's personal observations and the provider's statements to them. This document has been checked and approved by the attending provider.

## 2018-09-04 ENCOUNTER — Telehealth: Payer: Self-pay | Admitting: *Deleted

## 2018-09-04 NOTE — Telephone Encounter (Signed)
CALLED PATIENT TO INFORM OF CT FOR 09-11-18 - ARRIVAL TIME - 3:45 PM @ [facility], NO RESTRICTIONS TO TEST, SPOKE WITH PATIENT AND HE IS AWARE OF THIS TEST

## 2018-09-11 ENCOUNTER — Other Ambulatory Visit: Payer: Self-pay

## 2018-09-11 ENCOUNTER — Ambulatory Visit (HOSPITAL_COMMUNITY)
Admission: RE | Admit: 2018-09-11 | Discharge: 2018-09-11 | Disposition: A | Discharge status: Home / Self Care | Payer: Medicare HMO | Source: Ambulatory Visit | Attending: Radiation Oncology | Admitting: Radiation Oncology

## 2018-09-11 DIAGNOSIS — R911 Solitary pulmonary nodule: Secondary | ICD-10-CM | POA: Insufficient documentation

## 2019-02-23 NOTE — Progress Notes (Signed)
Radiation Oncology         [phone]) [phone] ________________________________  Name: Terry Page. MRN: [id number]  Date: 02/24/2019  DOB: 11-17-1955  Follow-Up Visit Note  CC: Terry Macadam, MD  Terry Macadam, MD    ICD-10-CM   1. Incidental lung nodule, greater than or equal to 30mm  R91.1 CT Chest Wo Contrast    Diagnosis: PET positive solitary nodule presenting in the left upper lobe   Interval Since Last Radiation: Ten months, one week, and five days.  04/03/2018, 04/10/2018, 04/12/2018: LUL / 54 Gy in 3 fractions (SBRT)  Narrative: The patient returns today for routine follow-up. CT scan of chest on 09/11/2018 showed an interval decrease in the size of the posterior left upper lobe nodule, without complete resolution. The 6 mm left upper lobe nodule was stable from 02/18/2018 but may be slightly more prominent than on 10/22/2017.   On review of systems, he reports feeling well. He denies chest pain, shortness of breath,  Cough,  hemoptysis, new bony pain headaches dizziness or blurred vision.   ALLERGIES:  has No Known Allergies.  Meds: Current Outpatient Medications  Medication Sig Dispense Refill  . amLODipine (NORVASC) 10 MG tablet Take 1 tablet (10 mg total) by mouth daily. 30 tablet 0  . ASPIRIN LOW DOSE 81 MG EC tablet Take 81 mg by mouth daily.    [name] atorvastatin (LIPITOR) 20 MG tablet Take 20 mg by mouth daily.    [name] docusate sodium (COLACE) 100 MG capsule Take 100 mg by mouth 2 (two) times daily.    . naproxen sodium (ALEVE) 220 MG tablet Take 220 mg by mouth daily as needed (back pain).    [name] olmesartan (BENICAR) 40 MG tablet Take 40 mg by mouth daily.  1  . sodium chloride 1 g tablet TK 1 T PO TID     No current facility-administered medications for this encounter.    Physical Findings: The patient is in no acute distress. Patient is alert and oriented.  height is 5\' 9"  (1.753 m) and weight is 164 lb 4 oz (74.5 kg). His temporal temperature is 98.4 F (36.9  C). His blood pressure is 133/83 and his pulse is 97. His respiration is 18 and oxygen saturation is 96%. . Lungs are clear to auscultation bilaterally. Heart has regular rate and rhythm. No palpable cervical, supraclavicular, or axillary adenopathy. Abdomen soft, non-tender, normal bowel sounds.  Lab Findings: Lab Results  Component Value Date   WBC 8.6 12/05/2017   HGB 11.6 (L) 12/05/2017   HCT 34.2 (L) 12/05/2017   MCV 88.4 12/05/2017   PLT 200 12/05/2017    Radiographic Findings: No results found.  Impression:  PET positive solitary nodule presenting in the left upper lobe, s/p stereotactic body radiation therapy.    Clinically, the patient is doing well at this time. It Is now time to proceed with additional imaging.  I ordered a chest CT scan today which will likely be performed within the next week  Plan: Patient will follow-up with radiation oncology in six months.  Chest CT scan in the near future.  Total time spent in this encounter was 25 minutes which included reviewed the patient's most recent chest CT scan,  physical examination,  placement of orders for new CT scan and documentation ____________________________________   Blair Promise, PhD, MD  This document serves as a record of services personally performed by Gery Pray, MD. It was created on his behalf by Clerance Lav, a  trained medical scribe. The creation of this record is based on the scribe's personal observations and the provider's statements to them. This document has been checked and approved by the attending provider.

## 2019-02-24 ENCOUNTER — Ambulatory Visit
Admission: RE | Admit: 2019-02-24 | Discharge: 2019-02-24 | Disposition: A | Discharge status: Home / Self Care | Payer: Medicare HMO | Source: Ambulatory Visit | Attending: Radiation Oncology | Admitting: Radiation Oncology

## 2019-02-24 ENCOUNTER — Encounter: Payer: Self-pay | Admitting: Radiation Oncology

## 2019-02-24 ENCOUNTER — Other Ambulatory Visit: Payer: Self-pay

## 2019-02-24 VITALS — BP 133/83 | HR 97 | Temp 98.4°F | Resp 18 | Ht 69.0 in | Wt 164.2 lb

## 2019-02-24 DIAGNOSIS — R911 Solitary pulmonary nodule: Secondary | ICD-10-CM | POA: Diagnosis not present

## 2019-02-24 DIAGNOSIS — Z79899 Other long term (current) drug therapy: Secondary | ICD-10-CM | POA: Insufficient documentation

## 2019-02-24 DIAGNOSIS — Z923 Personal history of irradiation: Secondary | ICD-10-CM | POA: Diagnosis not present

## 2019-02-24 NOTE — Progress Notes (Signed)
Mr. Schutter presents today for f/u with Dr. Sondra Come. Pt denies c/o pain. Pt denies cough, hemoptysis. Pt denies SOB. Pt denies difficulty swallowing.   BP 133/83 (BP Location: Left Arm, Patient Position: Sitting)   Pulse 97   Temp 98.4 F (36.9 C) (Temporal)   Resp 18   Ht 5\' 9"  (1.753 m)   Wt 164 lb 4 oz (74.5 kg)   SpO2 96%   BMI 24.26 kg/m   Wt Readings from Last 3 Encounters:  02/24/19 164 lb 4 oz (74.5 kg)  08/26/18 165 lb 2 oz (74.9 kg)  03/21/18 156 lb 6.4 oz (70.9 kg)   Loma Sousa, RN BSN

## 2019-02-24 NOTE — Patient Instructions (Signed)
Coronavirus (COVID-19) Are you at risk?  Are you at risk for the Coronavirus (COVID-19)?  To be considered HIGH RISK for Coronavirus (COVID-19), you have to meet the following criteria:  . Traveled to [place], [place], [place], [place] or [place]; or in the [place] to [place], [place], [place], or [place]; and have fever, cough, and shortness of breath within the last 2 weeks of travel OR . Been in close contact with a person diagnosed with COVID-19 within the last 2 weeks and have fever, cough, and shortness of breath . IF YOU DO NOT MEET THESE CRITERIA, YOU ARE CONSIDERED LOW RISK FOR COVID-19.  What to do if you are HIGH RISK for COVID-19?  . If you are having a medical emergency, call 911. . Seek medical care right away. Before you go to a doctor's office, urgent care or emergency department, call ahead and tell them about your recent travel, contact with someone diagnosed with COVID-19, and your symptoms. You should receive instructions from your physician's office regarding next steps of care.  . When you arrive at healthcare provider, tell the healthcare staff immediately you have returned from visiting [place], [place], [place], [place] or [place]; or traveled in the [place] to [place], [place], [place], or [place]; in the last two weeks or you have been in close contact with a person diagnosed with COVID-19 in the last 2 weeks.   . Tell the health care staff about your symptoms: fever, cough and shortness of breath. . After you have been seen by a medical provider, you will be either: o Tested for (COVID-19) and discharged home on quarantine except to seek medical care if symptoms worsen, and asked to  - Stay home and avoid contact with others until you get your results (4-5 days)  - Avoid travel on public transportation if possible (such as bus, train, or airplane) or o Sent to the Emergency Department by EMS for evaluation, COVID-19 testing, and possible  admission depending on your condition and test results.  What to do if you are LOW RISK for COVID-19?  Reduce your risk of any infection by using the same precautions used for avoiding the common cold or flu:  . Wash your hands often with soap and warm water for at least 20 seconds.  If soap and water are not readily available, use an alcohol-based hand sanitizer with at least 60% alcohol.  . If coughing or sneezing, cover your mouth and nose by coughing or sneezing into the elbow areas of your shirt or coat, into a tissue or into your sleeve (not your hands). . Avoid shaking hands with others and consider head nods or verbal greetings only. . Avoid touching your eyes, nose, or mouth with unwashed hands.  . Avoid close contact with people who are sick. . Avoid places or events with large numbers of people in one location, like concerts or sporting events. . Carefully consider travel plans you have or are making. . If you are planning any travel outside or inside the [place], visit the CDC's Travelers' Health webpage for the latest health notices. . If you have some symptoms but not all symptoms, continue to monitor at home and seek medical attention if your symptoms worsen. . If you are having a medical emergency, call 911.   ADDITIONAL HEALTHCARE OPTIONS FOR PATIENTS  [facility] / e-Visit: [email or web address]         [facility]: [phone]  [facility]: [phone]                   [facility]: [phone]   

## 2019-02-26 ENCOUNTER — Telehealth: Payer: Self-pay | Admitting: *Deleted

## 2019-02-26 NOTE — Telephone Encounter (Signed)
CALLED PATIENT TO INFORM OF CT FOR 03-06-19 - ARRIVAL TIME- 12:15 PM @ [facility], NO RESTRICTIONS TO TEST, SPOKE WITH PATIENT AND HE IS AWARE OF THIS TEST

## 2019-03-06 ENCOUNTER — Ambulatory Visit (HOSPITAL_COMMUNITY)
Admission: RE | Admit: 2019-03-06 | Discharge: 2019-03-06 | Disposition: A | Discharge status: Home / Self Care | Payer: Medicare HMO | Source: Ambulatory Visit | Attending: Radiation Oncology | Admitting: Radiation Oncology

## 2019-03-06 ENCOUNTER — Other Ambulatory Visit: Payer: Self-pay

## 2019-03-06 DIAGNOSIS — R911 Solitary pulmonary nodule: Secondary | ICD-10-CM | POA: Insufficient documentation

## 2019-03-06 NOTE — Progress Notes (Signed)
Discussed CT scan findings with the patient.  Concern is the patient has a new lesion in the left upper lobe.  I discussed with patient we could consider SBRT to this lesion or review with chest CT scan with short interval of 3 months.  The patient feels most comfortable with repeating a chest CT scan in 3 months and evaluate from there.

## 2019-03-18 ENCOUNTER — Other Ambulatory Visit: Payer: Self-pay | Admitting: Radiation Oncology

## 2019-03-18 DIAGNOSIS — R911 Solitary pulmonary nodule: Secondary | ICD-10-CM

## 2019-05-12 ENCOUNTER — Other Ambulatory Visit: Payer: Self-pay | Admitting: Urology

## 2019-05-12 DIAGNOSIS — C61 Malignant neoplasm of prostate: Secondary | ICD-10-CM

## 2019-05-19 ENCOUNTER — Encounter: Payer: Self-pay | Admitting: Radiation Oncology

## 2019-05-19 NOTE — Progress Notes (Signed)
GU Location of Tumor / Histology: prostatic adenocarcinoma  If Prostate Cancer, Gleason Score is (3 + 4) and PSA is (18.10). Prostate volume: 21 g.  Terry Page. had recent PSA screening on 02/25/19 which revealed a PSA of 19.  Biopsies of prostate (if applicable) revealed:    Past/Anticipated interventions by urology, if any: prostate biopsy, CT abd/pelvis, bone scan scheduled for 05/26/2019,  referral for consideration of radiotherapy  Past/Anticipated interventions by medical oncology, if any: no  Weight changes, if any: denies  Bowel/Bladder complaints, if any: IPSS 0. SHIM 2. Denies dysuria or hematuria. Denies urinary leakage or incontinence.   Nausea/Vomiting, if any: denies  Pain issues, if any:  denies  SAFETY ISSUES:  Prior radiation? Yes under the care of Dr. Sondra Come for lung ca.  Pacemaker/ICD? denies  Possible current pregnancy? no, male patient  Is the patient on methotrexate? denies  Current Complaints / other details:  64 year old male. Single with 1 daughter and 3 sons.

## 2019-05-20 ENCOUNTER — Ambulatory Visit
Admission: RE | Admit: 2019-05-20 | Discharge: 2019-05-20 | Disposition: A | Discharge status: Home / Self Care | Payer: Medicare HMO | Source: Ambulatory Visit | Attending: Radiation Oncology | Admitting: Radiation Oncology

## 2019-05-20 ENCOUNTER — Other Ambulatory Visit: Payer: Self-pay

## 2019-05-20 ENCOUNTER — Encounter: Payer: Self-pay | Admitting: Radiation Oncology

## 2019-05-20 VITALS — Ht 69.0 in | Wt 162.0 lb

## 2019-05-20 DIAGNOSIS — C61 Malignant neoplasm of prostate: Secondary | ICD-10-CM

## 2019-05-20 HISTORY — DX: Malignant neoplasm of prostate: C61

## 2019-05-20 NOTE — Progress Notes (Signed)
See progress note under physician encounter. 

## 2019-05-20 NOTE — Progress Notes (Signed)
Radiation Oncology         [phone]) [phone] ________________________________  Initial Outpatient Consultation - Conducted via Telephone due to current COVID-19 concerns for limiting patient exposure  Name: Terry Page. MRN: [id number]  Date: 05/20/2019  DOB: 28-Apr-1955  XL:[id number], Apolonio Schneiders, MD  [name], MD   REFERRING PHYSICIAN: [name], MD  DIAGNOSIS: 64 y.o. gentleman with Stage T1c adenocarcinoma of the prostate with Gleason score of 3+4, and PSA of 18.1.    ICD-10-CM   1. Malignant neoplasm of prostate ([place])  C61     HISTORY OF PRESENT ILLNESS: Terry Page. is a 64 y.o. male with a diagnosis of prostate cancer. He has a history of PET-positive solitary LUL pulmonary nodule, treated with SBRT in 04/2018 under the care of Dr. Sondra Come.  Recently, he was noted to have an elevated PSA of 19 on routine labs performed on 02/25/2019 by his primary care physician, Dr. Mannie Stabile.  He denies having any prior PSA tests to his knowledge.  Accordingly, he was referred for evaluation in urology by Dr. Gloriann Loan on 03/18/2019,  digital rectal examination was performed at that time revealing no nodules.  A repeat PSA performed that day remained elevated at 18.1.  Therefore, the patient proceeded to transrectal ultrasound with 12 biopsies of the prostate on 05/02/2019.  The prostate volume measured 20.74 cc.  Out of 12 core biopsies, 9 were positive.  The maximum Gleason score was 3+4, and this was seen in the right mid, right apex, and left apex.  Additionally, Gleason 3+3 was seen in the left mid lateral (small focus), left apex lateral, left base, left mid, right mid lateral, and right apex lateral.  The patient has kindly been referred today for discussion of potential radiation treatment options.  He is scheduled for disease staging with bone scan and CT A/P on 05/26/2019.  PREVIOUS RADIATION THERAPY: Yes-  4/1, 4/8, 04/12/2018: The LUL lung lesion was treated to 54 Gy in 3 fractions  (SBRT/Dr. Sondra Come).  PAST MEDICAL HISTORY:  Past Medical History:  Diagnosis Date  . Alcohol abuse   . 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 Hemorrhoids   . Incidental lung nodule, greater than or equal to 72mm 10/22/2017   Left upper lobe - discovered on CTA  . Prostate cancer ([place])   . Spontaneous pneumothorax 10/20/2017   right  . Tobacco abuse       PAST SURGICAL HISTORY: Past Surgical History:  Procedure Laterality Date  . BACK SURGERY    . PROSTATE BIOPSY      FAMILY HISTORY:  Family History  Problem Relation Age of Onset  . Cancer Cousin        maternal cousin  . Cancer Cousin        paternal cousin  . Cancer Cousin   . Colon polyps Neg Hx   . Pancreatic disease Neg Hx   . Pancreatic cancer Neg Hx   . Breast cancer Neg Hx   . Colon cancer Neg Hx     SOCIAL HISTORY:  Social History   Socioeconomic History  . Marital status: Single    Spouse name: Not on file  . Number of children: Not on file  . Years of education: Not on file  . Highest education level: Not on file  Occupational History  . Not on file  Tobacco Use  . Smoking status: Former Smoker    Packs/day: 0.50    Years: 52.00    Pack years: 26.00  Types: Cigarettes    Quit date: 10/10/2017    Years since quitting: 1.6  . Smokeless tobacco: Never Used  Substance and Sexual Activity  . Alcohol use: Yes    Alcohol/week: 12.0 standard drinks    Types: 12 Cans of beer per week    Comment: daily  . Drug use: No  . Sexual activity: Not Currently  Other Topics Concern  . Not on file  Social History Narrative  . Not on file   Social Determinants of Health   Financial Resource Strain:   . Difficulty of Paying Living Expenses:   Food Insecurity:   . Worried About Charity fundraiser in the Last Year:   . Arboriculturist in the Last Year:   Transportation Needs:   . Film/video editor (Medical):   [name] Lack of Transportation (Non-Medical):   Physical Activity:   . Days of Exercise per Week:   .  Minutes of Exercise per Session:   Stress:   . Feeling of Stress :   Social Connections:   . Frequency of Communication with Friends and Family:   . Frequency of Social Gatherings with Friends and Family:   . Attends Religious Services:   . Active Member of Clubs or Organizations:   . Attends Archivist Meetings:   [name] Marital Status:   Intimate Partner Violence:   . Fear of Current or Ex-Partner:   . Emotionally Abused:   [name] Physically Abused:   . Sexually Abused:     ALLERGIES: Patient has no known allergies.  MEDICATIONS:  Current Outpatient Medications  Medication Sig Dispense Refill  . amLODipine (NORVASC) 10 MG tablet Take 1 tablet (10 mg total) by mouth daily. 30 tablet 0  . ASPIRIN LOW DOSE 81 MG EC tablet Take 81 mg by mouth daily.    [name] atorvastatin (LIPITOR) 20 MG tablet Take 20 mg by mouth daily.    [name] docusate sodium (COLACE) 100 MG capsule Take 100 mg by mouth 2 (two) times daily.    . naproxen sodium (ALEVE) 220 MG tablet Take 220 mg by mouth daily as needed (back pain).    [name] olmesartan (BENICAR) 40 MG tablet Take 40 mg by mouth daily.  1  . sodium chloride 1 g tablet TK 1 T PO TID     No current facility-administered medications for this encounter.    REVIEW OF SYSTEMS:  On review of systems, the patient reports that he is doing well overall. He denies any chest pain, shortness of breath, cough, fevers, chills, night sweats, unintended weight changes. He denies any bowel disturbances, and denies abdominal pain, nausea or vomiting. He denies any new musculoskeletal or joint aches or pains. His IPSS was 0, indicating no urinary symptoms. His SHIM was 2, indicating he has severe erectile dysfunction. A complete review of systems is obtained and is otherwise negative.    PHYSICAL EXAM:  Wt Readings from Last 3 Encounters:  05/20/19 162 lb (73.5 kg)  02/24/19 164 lb 4 oz (74.5 kg)  08/26/18 165 lb 2 oz (74.9 kg)   Temp Readings from Last 3 Encounters:    02/24/19 98.4 F (36.9 C) (Temporal)  08/26/18 98.5 F (36.9 C) (Temporal)  03/21/18 97.8 F (36.6 C)   BP Readings from Last 3 Encounters:  02/24/19 133/83  08/26/18 128/90  03/21/18 (!) 153/99   Pulse Readings from Last 3 Encounters:  02/24/19 97  08/26/18 80  03/21/18 79   Pain Assessment Pain Score:  0-No pain/10  Physical exam not performed in light of telephone consult visit format.   KPS = 90  100 - Normal; no complaints; no evidence of disease. 90   - Able to carry on normal activity; minor signs or symptoms of disease. 80   - Normal activity with effort; some signs or symptoms of disease. 56   - Cares for self; unable to carry on normal activity or to do active work. 60   - Requires occasional assistance, but is able to care for most of his personal needs. 50   - Requires considerable assistance and frequent medical care. 29   - Disabled; requires special care and assistance. 81   - Severely disabled; hospital admission is indicated although death not imminent. 70   - Very sick; hospital admission necessary; active supportive treatment necessary. 10   - Moribund; fatal processes progressing rapidly. 0     - Dead  Karnofsky DA, Abelmann Bulpitt, Craver LS and Burchenal [place] [phone]) The use of the nitrogen mustards in the palliative treatment of carcinoma: with particular reference to bronchogenic carcinoma Cancer 1 634-56  LABORATORY DATA:  Lab Results  Component Value Date   WBC 8.6 12/05/2017   HGB 11.6 (L) 12/05/2017   HCT 34.2 (L) 12/05/2017   MCV 88.4 12/05/2017   PLT 200 12/05/2017   Lab Results  Component Value Date   NA 126 (L) 12/06/2017   K 4.2 12/06/2017   CL 97 (L) 12/06/2017   CO2 21 (L) 12/06/2017   No results found for: ALT, AST, GGT, ALKPHOS, BILITOT   RADIOGRAPHY: No results found.    IMPRESSION/PLAN: This visit was conducted via Telephone to spare the patient unnecessary potential exposure in the healthcare setting during the current  COVID-19 pandemic. 1. 64 y.o. gentleman with Stage T1c adenocarcinoma of the prostate with Gleason Score of 3+4, and PSA of 18.1. We discussed the patient's workup and outlined the nature of prostate cancer in this setting. The patient's T stage, Gleason's score, and PSA put him into the intermediate risk group. Pending his staging scans show his cancer remains confined to the prostate without evidence of distant metastasis, he is eligible for a variety of potential treatment options including brachytherapy, 5.5 weeks of external radiation, or prostatectomy. Given his small prostate size and high PSA, the recommendation is for a 5-1/2-week course of prostate IMRT. We discussed the available radiation techniques, and focused on the details and logistics of delivery. We discussed and outlined the risks, benefits, short and long-term effects associated with radiotherapy and compared and contrasted these with prostatectomy. We discussed the role of SpaceOAR in reducing the rectal toxicity associated with radiotherapy.  He was encouraged to ask questions that were answered to his stated satisfaction  At the end of the conversation, the patient is planning to proceed with CT A/P and bone scan for disease staging on 05/26/2019.  Pending there is no evidence for advanced disease with distant metastasis, he is interested in moving forward with a 5.5 week course of prostate IMRT. We will share our discussion with Dr. Gloriann Loan and once we have the results from his upcoming scans and confirm appropriate treatment recommendations, we will make arrangements for fiducial markers and SpaceOAR gel placement, first available, prior to CT simulation, to reduce rectal toxicity from radiotherapy. The patient appears to have a good understanding of his disease and our treatment recommendations which are of curative intent and is in agreement with the stated plan.  He does understand that  the treatment recommendations could potentially  change pending the results from his upcoming staging scans.  Therefore, we will await his imaging results and move forward with treatment planning accordingly, in anticipation of beginning prostate IMRT in the near future.  We will need to connect him with our transportation coordinator, Marrianne Mood, here at the cancer center to assist him with his transportation needs throughout treatment.  Given current concerns for patient exposure during the COVID-19 pandemic, this encounter was conducted via telephone. The patient was notified in advance and was offered a MyChart meeting to allow for face to face communication but unfortunately reported that he did not have the appropriate resources/technology to support such a visit and instead preferred to proceed with telephone consult. The patient has given verbal consent for this type of encounter. The time spent during this encounter was 60 minutes. The attendants for this meeting include Tyler Pita MD, Ashlyn Bruning PA-C, [place], and patient, Terry Page.. During the encounter, Tyler Pita MD, Ashlyn Bruning PA-C, and scribe, Wilburn Mylar were located at [place].  Patient, Terry Page. was located at home.    Nicholos Johns, PA-C    Tyler Pita, MD  [facility] Direct Dial: [phone]  Fax: [phone] [email or web address]  Skype  [email or web address]  This document serves as a record of services personally performed by Tyler Pita, MD and Freeman Caldron, PA-C. It was created on their behalf by Wilburn Mylar, a trained medical scribe. The creation of this record is based on the scribe's personal observations and the provider's statements to them. This document has been checked and approved by the attending provider.

## 2019-05-26 ENCOUNTER — Other Ambulatory Visit: Payer: Self-pay

## 2019-05-26 ENCOUNTER — Encounter (HOSPITAL_COMMUNITY)
Admission: RE | Admit: 2019-05-26 | Discharge: 2019-05-26 | Disposition: A | Discharge status: Home / Self Care | Payer: Medicare HMO | Source: Ambulatory Visit | Attending: Urology | Admitting: Urology

## 2019-05-26 DIAGNOSIS — C61 Malignant neoplasm of prostate: Secondary | ICD-10-CM | POA: Insufficient documentation

## 2019-05-26 MED ORDER — TECHNETIUM TC 99M MEDRONATE IV KIT
20.0000 | PACK | Freq: Once | INTRAVENOUS | Status: AC | PRN
  Administered 2019-05-26: 21.1 via INTRAVENOUS

## 2019-05-30 ENCOUNTER — Encounter: Payer: Self-pay | Admitting: Medical Oncology

## 2019-05-30 NOTE — Progress Notes (Signed)
Left a message requesting a call back as follow up to radiation consult 05/20/18.

## 2019-06-01 ENCOUNTER — Encounter (HOSPITAL_COMMUNITY): Payer: Self-pay | Admitting: Internal Medicine

## 2019-06-01 ENCOUNTER — Emergency Department (HOSPITAL_COMMUNITY): Payer: Medicare HMO

## 2019-06-01 ENCOUNTER — Inpatient Hospital Stay (HOSPITAL_COMMUNITY)
Admission: EM | Admit: 2019-06-01 | Discharge: 2019-06-06 | DRG: 199 | Disposition: A | Discharge status: Home / Self Care | Payer: Medicare HMO | Attending: Family Medicine | Admitting: Family Medicine

## 2019-06-01 DIAGNOSIS — R911 Solitary pulmonary nodule: Secondary | ICD-10-CM | POA: Diagnosis present

## 2019-06-01 DIAGNOSIS — J930 Spontaneous tension pneumothorax: Secondary | ICD-10-CM | POA: Diagnosis present

## 2019-06-01 DIAGNOSIS — Z923 Personal history of irradiation: Secondary | ICD-10-CM | POA: Diagnosis not present

## 2019-06-01 DIAGNOSIS — Z9689 Presence of other specified functional implants: Secondary | ICD-10-CM

## 2019-06-01 DIAGNOSIS — E871 Hypo-osmolality and hyponatremia: Secondary | ICD-10-CM | POA: Diagnosis present

## 2019-06-01 DIAGNOSIS — J439 Emphysema, unspecified: Secondary | ICD-10-CM | POA: Diagnosis present

## 2019-06-01 DIAGNOSIS — Z720 Tobacco use: Secondary | ICD-10-CM | POA: Diagnosis present

## 2019-06-01 DIAGNOSIS — J9811 Atelectasis: Secondary | ICD-10-CM | POA: Diagnosis present

## 2019-06-01 DIAGNOSIS — D72829 Elevated white blood cell count, unspecified: Secondary | ICD-10-CM | POA: Diagnosis present

## 2019-06-01 DIAGNOSIS — Z20822 Contact with and (suspected) exposure to covid-19: Secondary | ICD-10-CM | POA: Diagnosis present

## 2019-06-01 DIAGNOSIS — F1020 Alcohol dependence, uncomplicated: Secondary | ICD-10-CM | POA: Diagnosis present

## 2019-06-01 DIAGNOSIS — J9601 Acute respiratory failure with hypoxia: Secondary | ICD-10-CM | POA: Diagnosis present

## 2019-06-01 DIAGNOSIS — J9383 Other pneumothorax: Secondary | ICD-10-CM | POA: Diagnosis present

## 2019-06-01 DIAGNOSIS — Z79899 Other long term (current) drug therapy: Secondary | ICD-10-CM

## 2019-06-01 DIAGNOSIS — I472 Ventricular tachycardia: Secondary | ICD-10-CM | POA: Diagnosis present

## 2019-06-01 DIAGNOSIS — Z7982 Long term (current) use of aspirin: Secondary | ICD-10-CM

## 2019-06-01 DIAGNOSIS — F17210 Nicotine dependence, cigarettes, uncomplicated: Secondary | ICD-10-CM | POA: Diagnosis present

## 2019-06-01 DIAGNOSIS — J449 Chronic obstructive pulmonary disease, unspecified: Secondary | ICD-10-CM | POA: Diagnosis present

## 2019-06-01 DIAGNOSIS — I10 Essential (primary) hypertension: Secondary | ICD-10-CM | POA: Diagnosis present

## 2019-06-01 DIAGNOSIS — E785 Hyperlipidemia, unspecified: Secondary | ICD-10-CM | POA: Diagnosis present

## 2019-06-01 DIAGNOSIS — C61 Malignant neoplasm of prostate: Secondary | ICD-10-CM | POA: Diagnosis present

## 2019-06-01 DIAGNOSIS — J939 Pneumothorax, unspecified: Secondary | ICD-10-CM | POA: Diagnosis not present

## 2019-06-01 DIAGNOSIS — Z09 Encounter for follow-up examination after completed treatment for conditions other than malignant neoplasm: Secondary | ICD-10-CM

## 2019-06-01 DIAGNOSIS — F1010 Alcohol abuse, uncomplicated: Secondary | ICD-10-CM | POA: Diagnosis present

## 2019-06-01 DIAGNOSIS — Z4682 Encounter for fitting and adjustment of non-vascular catheter: Secondary | ICD-10-CM

## 2019-06-01 DIAGNOSIS — J441 Chronic obstructive pulmonary disease with (acute) exacerbation: Secondary | ICD-10-CM | POA: Diagnosis present

## 2019-06-01 LAB — CBC WITH DIFFERENTIAL/PLATELET
Abs Immature Granulocytes: 0.05 10*3/uL (ref 0.00–0.07)
Basophils Absolute: 0.1 10*3/uL (ref 0.0–0.1)
Basophils Relative: 1 %
Eosinophils Absolute: 0.1 10*3/uL (ref 0.0–0.5)
Eosinophils Relative: 1 %
HCT: 44 % (ref 39.0–52.0)
Hemoglobin: 14.9 g/dL (ref 13.0–17.0)
Immature Granulocytes: 1 %
Lymphocytes Relative: 16 %
Lymphs Abs: 1.7 10*3/uL (ref 0.7–4.0)
MCH: 30.6 pg (ref 26.0–34.0)
MCHC: 33.9 g/dL (ref 30.0–36.0)
MCV: 90.3 fL (ref 80.0–100.0)
Monocytes Absolute: 0.9 10*3/uL (ref 0.1–1.0)
Monocytes Relative: 9 %
Neutro Abs: 7.5 10*3/uL (ref 1.7–7.7)
Neutrophils Relative %: 72 %
Platelets: 339 10*3/uL (ref 150–400)
RBC: 4.87 MIL/uL (ref 4.22–5.81)
RDW: 14.5 % (ref 11.5–15.5)
WBC: 10.3 10*3/uL (ref 4.0–10.5)
nRBC: 0 % (ref 0.0–0.2)

## 2019-06-01 LAB — POC SARS CORONAVIRUS 2 AG -  ED: SARS Coronavirus 2 Ag: NEGATIVE

## 2019-06-01 LAB — I-STAT CHEM 8, ED
BUN: 7 mg/dL — ABNORMAL LOW (ref 8–23)
Calcium, Ion: 1.1 mmol/L — ABNORMAL LOW (ref 1.15–1.40)
Chloride: 94 mmol/L — ABNORMAL LOW (ref 98–111)
Creatinine, Ser: 0.8 mg/dL (ref 0.61–1.24)
Glucose, Bld: 182 mg/dL — ABNORMAL HIGH (ref 70–99)
HCT: 49 % (ref 39.0–52.0)
Hemoglobin: 16.7 g/dL (ref 13.0–17.0)
Potassium: 3.7 mmol/L (ref 3.5–5.1)
Sodium: 131 mmol/L — ABNORMAL LOW (ref 135–145)
TCO2: 26 mmol/L (ref 22–32)

## 2019-06-01 LAB — COMPREHENSIVE METABOLIC PANEL
ALT: 39 U/L (ref 0–44)
AST: 71 U/L — ABNORMAL HIGH (ref 15–41)
Albumin: 4.4 g/dL (ref 3.5–5.0)
Alkaline Phosphatase: 72 U/L (ref 38–126)
Anion gap: 14 (ref 5–15)
BUN: 6 mg/dL — ABNORMAL LOW (ref 8–23)
CO2: 21 mmol/L — ABNORMAL LOW (ref 22–32)
Calcium: 9.3 mg/dL (ref 8.9–10.3)
Chloride: 95 mmol/L — ABNORMAL LOW (ref 98–111)
Creatinine, Ser: 0.71 mg/dL (ref 0.61–1.24)
GFR calc Af Amer: >60 mL/min (ref >60)
GFR calc non Af Amer: >60 mL/min (ref >60)
Glucose, Bld: 173 mg/dL — ABNORMAL HIGH (ref 70–99)
Potassium: 3.6 mmol/L (ref 3.5–5.1)
Sodium: 130 mmol/L — ABNORMAL LOW (ref 135–145)
Total Bilirubin: 0.9 mg/dL (ref 0.3–1.2)
Total Protein: 9.1 g/dL — ABNORMAL HIGH (ref 6.5–8.1)

## 2019-06-01 LAB — SARS CORONAVIRUS 2 BY RT PCR (HOSPITAL ORDER, PERFORMED IN ~~LOC~~ HOSPITAL LAB): SARS Coronavirus 2: NEGATIVE

## 2019-06-01 LAB — TROPONIN I (HIGH SENSITIVITY)
Troponin I (High Sensitivity): 3 ng/L (ref <18)
Troponin I (High Sensitivity): 9 ng/L (ref <18)

## 2019-06-01 MED ORDER — SODIUM CHLORIDE 0.9% FLUSH
3.0000 mL | Freq: Two times a day (BID) | INTRAVENOUS | Status: DC
  Administered 2019-06-01 – 2019-06-06 (×10): 3 mL via INTRAVENOUS

## 2019-06-01 MED ORDER — LORAZEPAM 2 MG/ML IJ SOLN
1.0000 mg | INTRAMUSCULAR | Status: DC | PRN
  Administered 2019-06-01: 2 mg via INTRAVENOUS
  Filled 2019-06-01: qty 1

## 2019-06-01 MED ORDER — IRBESARTAN 300 MG PO TABS
300.0000 mg | ORAL_TABLET | Freq: Every day | ORAL | Status: DC
  Administered 2019-06-02 – 2019-06-06 (×5): 300 mg via ORAL
  Filled 2019-06-01 (×5): qty 1

## 2019-06-01 MED ORDER — ONDANSETRON HCL 4 MG PO TABS: 4.0000 mg | ORAL_TABLET | Freq: Four times a day (QID) | ORAL | Status: DC | PRN

## 2019-06-01 MED ORDER — THIAMINE HCL 100 MG/ML IJ SOLN
100.0000 mg | Freq: Every day | INTRAMUSCULAR | Status: DC
  Administered 2019-06-06: 100 mg via INTRAVENOUS
  Filled 2019-06-01 (×2): qty 2

## 2019-06-01 MED ORDER — MORPHINE SULFATE (PF) 2 MG/ML IV SOLN
2.0000 mg | INTRAVENOUS | Status: DC | PRN
  Administered 2019-06-01: 2 mg via INTRAVENOUS
  Filled 2019-06-01: qty 1

## 2019-06-01 MED ORDER — ALBUTEROL SULFATE (2.5 MG/3ML) 0.083% IN NEBU: 2.5000 mg | INHALATION_SOLUTION | RESPIRATORY_TRACT | Status: DC | PRN

## 2019-06-01 MED ORDER — ACETAMINOPHEN 325 MG PO TABS
650.0000 mg | ORAL_TABLET | Freq: Four times a day (QID) | ORAL | Status: DC | PRN
  Administered 2019-06-03: 650 mg via ORAL
  Filled 2019-06-01: qty 2

## 2019-06-01 MED ORDER — MAGNESIUM SULFATE 2 GM/50ML IV SOLN
INTRAVENOUS | Status: AC
  Filled 2019-06-01: qty 50

## 2019-06-01 MED ORDER — AMLODIPINE BESYLATE 10 MG PO TABS
10.0000 mg | ORAL_TABLET | Freq: Every day | ORAL | Status: DC
  Administered 2019-06-02 – 2019-06-06 (×5): 10 mg via ORAL
  Filled 2019-06-01 (×4): qty 1

## 2019-06-01 MED ORDER — NICOTINE 14 MG/24HR TD PT24
14.0000 mg | MEDICATED_PATCH | Freq: Every day | TRANSDERMAL | Status: DC
  Administered 2019-06-01 – 2019-06-06 (×6): 14 mg via TRANSDERMAL
  Filled 2019-06-01 (×6): qty 1

## 2019-06-01 MED ORDER — FENTANYL CITRATE (PF) 100 MCG/2ML IJ SOLN
50.0000 ug | Freq: Once | INTRAMUSCULAR | Status: AC
  Administered 2019-06-01: 50 ug via INTRAVENOUS
  Filled 2019-06-01: qty 2

## 2019-06-01 MED ORDER — IPRATROPIUM-ALBUTEROL 0.5-2.5 (3) MG/3ML IN SOLN
RESPIRATORY_TRACT | Status: AC
  Administered 2019-06-01: 3 mL
  Filled 2019-06-01: qty 3

## 2019-06-01 MED ORDER — HYDRALAZINE HCL 20 MG/ML IJ SOLN
5.0000 mg | INTRAMUSCULAR | Status: DC | PRN
  Administered 2019-06-01: 5 mg via INTRAVENOUS
  Filled 2019-06-01: qty 1

## 2019-06-01 MED ORDER — LORAZEPAM 1 MG PO TABS
1.0000 mg | ORAL_TABLET | ORAL | Status: DC | PRN
  Administered 2019-06-01: 1 mg via ORAL
  Filled 2019-06-01: qty 1

## 2019-06-01 MED ORDER — POLYETHYLENE GLYCOL 3350 17 G PO PACK: 17.0000 g | PACK | Freq: Every day | ORAL | Status: DC | PRN

## 2019-06-01 MED ORDER — ASPIRIN 81 MG PO TBEC: 81.0000 mg | DELAYED_RELEASE_TABLET | Freq: Every day | ORAL | Status: DC

## 2019-06-01 MED ORDER — FOLIC ACID 1 MG PO TABS
1.0000 mg | ORAL_TABLET | Freq: Every day | ORAL | Status: DC
  Administered 2019-06-01 – 2019-06-06 (×6): 1 mg via ORAL
  Filled 2019-06-01 (×6): qty 1

## 2019-06-01 MED ORDER — ADULT MULTIVITAMIN W/MINERALS CH
1.0000 | ORAL_TABLET | Freq: Every day | ORAL | Status: DC
  Administered 2019-06-01 – 2019-06-06 (×6): 1 via ORAL
  Filled 2019-06-01 (×6): qty 1

## 2019-06-01 MED ORDER — LIDOCAINE-EPINEPHRINE (PF) 2 %-1:200000 IJ SOLN
20.0000 mL | Freq: Once | INTRAMUSCULAR | Status: AC
  Administered 2019-06-01: 20 mL
  Filled 2019-06-01: qty 20

## 2019-06-01 MED ORDER — EPINEPHRINE 0.3 MG/0.3ML IJ SOAJ
0.3000 mg | Freq: Once | INTRAMUSCULAR | Status: AC
  Administered 2019-06-01: 0.3 mg via INTRAMUSCULAR
  Filled 2019-06-01: qty 0.3

## 2019-06-01 MED ORDER — HYDROCODONE-ACETAMINOPHEN 5-325 MG PO TABS
1.0000 | ORAL_TABLET | ORAL | Status: DC | PRN
  Administered 2019-06-01 (×2): 2 via ORAL
  Filled 2019-06-01 (×2): qty 2

## 2019-06-01 MED ORDER — EPINEPHRINE 0.3 MG/0.3ML IJ SOAJ
INTRAMUSCULAR | Status: AC
  Filled 2019-06-01: qty 0.3

## 2019-06-01 MED ORDER — MAGNESIUM SULFATE 2 GM/50ML IV SOLN
2.0000 g | Freq: Once | INTRAVENOUS | Status: AC
  Administered 2019-06-01: 2 g via INTRAVENOUS
  Filled 2019-06-01: qty 50

## 2019-06-01 MED ORDER — ONDANSETRON HCL 4 MG/2ML IJ SOLN: 4.0000 mg | Freq: Four times a day (QID) | INTRAMUSCULAR | Status: DC | PRN

## 2019-06-01 MED ORDER — LIDOCAINE-EPINEPHRINE 1 %-1:100000 IJ SOLN
20.0000 mL | Freq: Once | INTRAMUSCULAR | Status: DC
  Filled 2019-06-01: qty 20

## 2019-06-01 MED ORDER — ENOXAPARIN SODIUM 40 MG/0.4ML ~~LOC~~ SOLN
40.0000 mg | SUBCUTANEOUS | Status: DC
  Administered 2019-06-01 – 2019-06-05 (×5): 40 mg via SUBCUTANEOUS
  Filled 2019-06-01 (×4): qty 0.4

## 2019-06-01 MED ORDER — BISACODYL 5 MG PO TBEC: 5.0000 mg | DELAYED_RELEASE_TABLET | Freq: Every day | ORAL | Status: DC | PRN

## 2019-06-01 MED ORDER — DOCUSATE SODIUM 100 MG PO CAPS
100.0000 mg | ORAL_CAPSULE | Freq: Two times a day (BID) | ORAL | Status: DC
  Administered 2019-06-01 – 2019-06-06 (×10): 100 mg via ORAL
  Filled 2019-06-01 (×10): qty 1

## 2019-06-01 MED ORDER — ATORVASTATIN CALCIUM 10 MG PO TABS
20.0000 mg | ORAL_TABLET | Freq: Every day | ORAL | Status: DC
  Administered 2019-06-02 – 2019-06-06 (×5): 20 mg via ORAL
  Filled 2019-06-01 (×5): qty 2

## 2019-06-01 MED ORDER — THIAMINE HCL 100 MG PO TABS
100.0000 mg | ORAL_TABLET | Freq: Every day | ORAL | Status: DC
  Administered 2019-06-01 – 2019-06-05 (×5): 100 mg via ORAL
  Filled 2019-06-01 (×6): qty 1

## 2019-06-01 MED ORDER — ACETAMINOPHEN 650 MG RE SUPP: 650.0000 mg | Freq: Four times a day (QID) | RECTAL | Status: DC | PRN

## 2019-06-01 NOTE — ED Triage Notes (Signed)
Pt bib ems from home with sudden onset of sob approx 1 hour pta. Given 2 neb pta, 125mg  solu medrol. Hx copd, hx spontaneous pneumo.  Simple mask at 15L 90%, room air sat 86%.

## 2019-06-01 NOTE — ED Notes (Signed)
Chest tube placed by Dr. Ralene Bathe

## 2019-06-01 NOTE — ED Notes (Signed)
Son Euclid Cassetta IV, [phone]

## 2019-06-01 NOTE — Plan of Care (Signed)

## 2019-06-01 NOTE — ED Provider Notes (Signed)
[facility] EMERGENCY DEPARTMENT Provider Note   CSN: [id number] Arrival date & time: 06/01/19  1210     History No chief complaint on file.   Terry Page. is a 64 y.o. male.  The history is provided by the patient and medical records. No language interpreter was used.   Terry Page. is a 64 y.o. male who presents to the Emergency Department complaining of sob.  Level V caveat due to respiratory distress.  He presents to the ED complaining of sob that started a few hours prior to ED arrival.  Denies fever, chest pain, vomiting, leg edema.  He was treated with two nebs PTA, 125 mg solumedrol.  Pt desaturated to 85% without supplemental oxygen.      Past Medical History:  Diagnosis Date  . Alcohol abuse   . 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 Hemorrhoids   . Incidental lung nodule, greater than or equal to 61mm 10/22/2017   Left upper lobe - discovered on CTA  . Prostate cancer ([place])   . Spontaneous pneumothorax 10/20/2017   right  . Tobacco abuse     Patient Active Problem List   Diagnosis Date Noted  . 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 Malignant neoplasm of prostate ([place]) 05/20/2019  . Hyponatremia 12/04/2017  . AKI (acute kidney injury) ([place]) 12/04/2017  . Incidental lung nodule, greater than or equal to 56mm 10/22/2017  . 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 Spontaneous pneumothorax 10/20/2017  . Tobacco abuse   . Alcohol abuse     Past Surgical History:  Procedure Laterality Date  . BACK SURGERY    . PROSTATE BIOPSY         Family History  Problem Relation Age of Onset  . Cancer Cousin        maternal cousin  . Cancer Cousin        paternal cousin  . Cancer Cousin   . Colon polyps Neg Hx   . Pancreatic disease Neg Hx   . Pancreatic cancer Neg Hx   . Breast cancer Neg Hx   . Colon cancer Neg Hx     Social History   Tobacco Use  . Smoking status: Current Every Day Smoker    Packs/day: 0.50    Years: 52.00    Pack  years: 26.00    Types: Cigarettes  . Smokeless tobacco: Never Used  Substance Use Topics  . Alcohol use: Yes    Alcohol/week: 12.0 standard drinks    Types: 12 Cans of beer per week    Comment: daily  . Drug use: No    Home Medications Prior to Admission medications   Medication Sig Start Date End Date Taking? Authorizing Provider  amLODipine (NORVASC) 10 MG tablet Take 1 tablet (10 mg total) by mouth daily. 12/06/17 05/20/19  Shelly Coss, MD  ASPIRIN LOW DOSE 81 MG EC tablet Take 81 mg by mouth daily. 01/23/19   [provider]  atorvastatin (LIPITOR) 20 MG tablet Take 20 mg by mouth daily. 01/28/19   [provider]  docusate sodium (COLACE) 100 MG capsule Take 100 mg by mouth 2 (two) times daily.    [provider]  naproxen sodium (ALEVE) 220 MG tablet Take 220 mg by mouth daily as needed (back pain).    [provider]  olmesartan (BENICAR) 40 MG tablet Take 40 mg by mouth daily. 11/16/17   [provider]  sodium chloride 1 g tablet TK 1 T  PO TID 04/09/18   [provider]    Allergies    Patient has no known allergies.  Review of Systems   Review of Systems  All other systems reviewed and are negative.   Physical Exam Updated Vital Signs BP (!) 156/104   Pulse (!) 104   Temp 97.8 F (36.6 C) (Oral)   Resp 20   SpO2 95%   Physical Exam Vitals and nursing note reviewed.  Constitutional:      General: He is in acute distress.     Appearance: He is well-developed. He is ill-appearing and diaphoretic.  HENT:     Head: Normocephalic and atraumatic.  Cardiovascular:     Rate and Rhythm: Regular rhythm. Tachycardia present.     Heart sounds: No murmur.  Pulmonary:     Effort: Respiratory distress present.     Comments: Accessory muscle use.  Decreased air movement, wheezing bilaterally Abdominal:     Palpations: Abdomen is soft.     Tenderness: There is no abdominal tenderness. There is no guarding or rebound.   Musculoskeletal:        General: No swelling or tenderness.  Skin:    General: Skin is warm.  Neurological:     Mental Status: He is alert and oriented to person, place, and time.  Psychiatric:     Comments: Anxious appearing     ED Results / Procedures / Treatments   Labs (all labs ordered are listed, but only abnormal results are displayed) Labs Reviewed  COMPREHENSIVE METABOLIC PANEL - Abnormal; Notable for the following components:      Result Value   Sodium 130 (*)    Chloride 95 (*)    CO2 21 (*)    Glucose, Bld 173 (*)    BUN 6 (*)    Total Protein 9.1 (*)    AST 71 (*)    All other components within normal limits  I-STAT CHEM 8, ED - Abnormal; Notable for the following components:   Sodium 131 (*)    Chloride 94 (*)    BUN 7 (*)    Glucose, Bld 182 (*)    Calcium, Ion 1.10 (*)    All other components within normal limits  SARS CORONAVIRUS 2 BY RT PCR (HOSPITAL ORDER, [facility])  CBC WITH DIFFERENTIAL/PLATELET  HIV ANTIBODY (ROUTINE TESTING W REFLEX)  POC SARS CORONAVIRUS 2 AG -  ED  TROPONIN I (HIGH SENSITIVITY)  TROPONIN I (HIGH SENSITIVITY)    EKG EKG Interpretation  Date/Time:  Sunday Jun 01 2019 12:18:37 EDT Ventricular Rate:  126 PR Interval:    QRS Duration: 89 QT Interval:  305 QTC Calculation: 442 R Axis:   -58 Text Interpretation: Sinus tachycardia Consider right atrial enlargement LAD, consider left anterior fascicular block Artifact in lead(s) II III aVR aVL aVF V1 V4 V5 V6 Confirmed by Quintella Reichert [phone]) on 06/01/2019 1:54:50 PM   Radiology DG Chest Port 1 View  Result Date: 06/01/2019 CLINICAL DATA:  Post chest tube placement Pt bib ems from home with sudden onset of sob approx 1 hour pta. Given 2 neb pta, 125mg  solu medrol. Hx copd, hx spontaneous pneumopigtail catheter placement EXAM: PORTABLE CHEST 1 VIEW COMPARISON:  Radiograph 06/01/2019 at 12:30 p.m. FINDINGS: Interval placement of a small bore RIGHT  chest tube. Interval expansion the lung and resolution the pneumothorax. No RIGHT pneumothorax appreciated. Small amount subcutaneous gas along the RIGHT chest wall noted. Persistant atelectasis in the LEFT lung similar  prior. Bullous change in the upper lobes. IMPRESSION: 1. Resolution of RIGHT pneumothorax following chest tube placement. 2. Bullous change in the upper lobes. 3. Small amount of gas along the RIGHT chest wall. Electronically Signed   By: Suzy Bouchard M.D.   On: 06/01/2019 13:43   DG Chest Port 1 View  Result Date: 06/01/2019 CLINICAL DATA:  Shortness of breath EXAM: PORTABLE CHEST 1 VIEW COMPARISON:  November 05, 2017 FINDINGS: There is a sizable pneumothorax on the right with mild tension component. There are scattered areas of scarring throughout the left lung. No edema or airspace opacity. Heart size is within normal limits. No adenopathy. Pulmonary vascularity reflects a degree of underlying emphysematous change. There is aortic atherosclerosis. No bone lesions. IMPRESSION: Sizable pneumothorax on the right with mild tension component. There is a degree of underlying emphysematous change. Scarring noted in the left lung. No edema or airspace opacity. Heart size within normal limits. There is aortic atherosclerosis. Aortic Atherosclerosis (ICD10-I70.0) and Emphysema (ICD10-J43.9). Critical Value/emergent results were called by telephone at the time of interpretation on 06/01/2019 at 12:37 pm to provider [facility] , who verbally acknowledged these results. Electronically Signed   By: Lowella Grip III M.D.   On: 06/01/2019 12:38    Procedures CHEST TUBE INSERTION  Date/Time: 06/01/2019 4:45 PM Performed by: Quintella Reichert, MD Authorized by: Quintella Reichert, MD   Consent:    Consent obtained:  Verbal   Consent given by:  Patient Pre-procedure details:    Skin preparation:  ChloraPrep Anesthesia (see MAR for exact dosages):    Anesthesia method:  Local infiltration    Local anesthetic:  Lidocaine 2% WITH epi Procedure details:    Placement location:  R anterior   Tube size (French): 14.   Tension pneumothorax: yes     Drainage characteristics:  Air only   Suture material:  0 silk Post-procedure details:    Post-insertion x-ray findings: tube in good position     Patient tolerance of procedure:  Tolerated well, no immediate complications Comments:     14 French pigtail catheter placed in the right upper chest wall via seldinger technique.  Wound was secured with zero silk, tegaderm   (including critical care time) CRITICAL CARE Performed by: Quintella Reichert   Total critical care time: 45 minutes  Critical care time was exclusive of separately billable procedures and treating other patients.  Critical care was necessary to treat or prevent imminent or life-threatening deterioration.  Critical care was time spent personally by me on the following activities: development of treatment plan with patient and/or surrogate as well as nursing, discussions with consultants, evaluation of patient's response to treatment, examination of patient, obtaining history from patient or surrogate, ordering and performing treatments and interventions, ordering and review of laboratory studies, ordering and review of radiographic studies, pulse oximetry and re-evaluation of patient's condition.  Medications Ordered in ED Medications  amLODipine (NORVASC) tablet 10 mg (has no administration in time range)  atorvastatin (LIPITOR) tablet 20 mg (has no administration in time range)  irbesartan (AVAPRO) tablet 300 mg (has no administration in time range)  LORazepam (ATIVAN) tablet 1-4 mg (has no administration in time range)    Or  LORazepam (ATIVAN) injection 1-4 mg (has no administration in time range)  thiamine tablet 100 mg (has no administration in time range)    Or  thiamine (B-1) injection 100 mg (has no administration in time range)  folic acid (FOLVITE) tablet 1  mg (has no administration in time  range)  multivitamin with minerals tablet 1 tablet (has no administration in time range)  acetaminophen (TYLENOL) tablet 650 mg (has no administration in time range)    Or  acetaminophen (TYLENOL) suppository 650 mg (has no administration in time range)  HYDROcodone-acetaminophen (NORCO/VICODIN) 5-325 MG per tablet 1-2 tablet (has no administration in time range)  morphine 2 MG/ML injection 2 mg (has no administration in time range)  docusate sodium (COLACE) capsule 100 mg (has no administration in time range)  polyethylene glycol (MIRALAX / GLYCOLAX) packet 17 g (has no administration in time range)  bisacodyl (DULCOLAX) EC tablet 5 mg (has no administration in time range)  ondansetron (ZOFRAN) tablet 4 mg (has no administration in time range)    Or  ondansetron (ZOFRAN) injection 4 mg (has no administration in time range)  nicotine (NICODERM CQ - dosed in mg/24 hours) patch 14 mg (has no administration in time range)  hydrALAZINE (APRESOLINE) injection 5 mg (has no administration in time range)  enoxaparin (LOVENOX) injection 40 mg (has no administration in time range)  sodium chloride flush (NS) 0.9 % injection 3 mL (has no administration in time range)  albuterol (PROVENTIL) (2.5 MG/3ML) 0.083% nebulizer solution 2.5 mg (has no administration in time range)  magnesium sulfate IVPB 2 g 50 mL (0 g Intravenous Stopped 06/01/19 1356)  EPINEPHrine (EPI-PEN) injection 0.3 mg (0.3 mg Intramuscular Given 06/01/19 1224)  ipratropium-albuterol (DUONEB) 0.5-2.5 (3) MG/3ML nebulizer solution (3 mLs  Given by Other 06/01/19 1227)  lidocaine-EPINEPHrine (XYLOCAINE W/EPI) 2 %-1:200000 (PF) injection 20 mL (20 mLs Other Given by Other 06/01/19 1314)  fentaNYL (SUBLIMAZE) injection 50 mcg (50 mcg Intravenous Given 06/01/19 1540)    ED Course  I have reviewed the triage vital signs and the nursing notes.  Pertinent labs & imaging results that were available during my care  of the patient were reviewed by me and considered in my medical decision making (see chart for details).    MDM Rules/Calculators/A&P                     Patient presents the emergency department with shortness of breath. Patient in respiratory distress on ED arrival with diaphoresis and sensory muscle use. He was treated for COPD exacerbation with magnesium, epi, continuous nebulizer treatments. Chest x-ray demonstrates large right-sided pneumothorax with tension components. Discussed with patient risks and benefits of chest tube in this was placed under emergent consent. Patient tolerated the procedure well and post procedural chest x-ray demonstrates the tubing good position with improvement in his pneumothorax. Following placement he does have persistent wheezing as well as slight increased work of breathing but significantly improved compared to ED presentation. Discussed with Dr. Kipp Brood, with cardiothoracic surgery. He recommends admission to the medicine service and he will see the patient and consult. Hospitalist consulted for admission. Patient updated of findings of studies and recommendation for admission and he is in agreement treatment plan.  Final Clinical Impression(s) / ED Diagnoses Final diagnoses:  Spontaneous pneumothorax    Rx / DC Orders ED Discharge Orders    None       Quintella Reichert, MD 06/01/19 1651

## 2019-06-01 NOTE — H&P (Signed)
History and Physical    Terry Page. MHD:[id number] DOB: 1955/01/30 DOA: 06/01/2019  PCP: [name], MD Consultants:  Tammi Klippel - rad onc; Roxy Manns - CT surgery; Gloriann Loan - urology Patient coming from:  Home - lives alone; NOK: Macalister, Arnaud, [phone]  Chief Complaint: SOB  HPI: Terry Page. is a 64 y.o. male with medical history significant of prostate CA; COPD; spontaneous PTX (2019); LUL lung nodule treated with XRT (04/2018);  and ETOH dependence presenting with SOB.  He started smoking again in the last 2 years.  He had a cigarette this AM and shortly thereafter felt SOB.  It was similar to his presentation 2 years ago.  Some wheezing.  No cough.  He couldn't breathe enough to cough.  He is breathing a bit better now, after placement of chest tube.   ED Course:  Spontaneous PTX. [place] will see but would like medicine to see.  Given BIPAP, epi, mag... concern for COPD exacerbation initially. Chest tube inserted and patient is better.   Review of Systems: As per HPI; otherwise review of systems reviewed and negative.   Ambulatory Status:  Ambulates without assistance  COVID Vaccine Status:  None  Past Medical History:  Diagnosis Date  . Alcohol abuse   . 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 Hemorrhoids   . Incidental lung nodule, greater than or equal to 7mm 10/22/2017   Left upper lobe - discovered on CTA  . Prostate cancer ([place])   . Spontaneous pneumothorax 10/20/2017   right  . Tobacco abuse     Past Surgical History:  Procedure Laterality Date  . BACK SURGERY    . PROSTATE BIOPSY      Social History   Socioeconomic History  . Marital status: Single    Spouse name: Not on file  . Number of children: Not on file  . Years of education: Not on file  . Highest education level: Not on file  Occupational History  . Occupation: retired  Tobacco Use  . Smoking status: Current Every Day Smoker    Packs/day: 0.50    Years: 52.00    Pack years: 26.00     Types: Cigarettes  . Smokeless tobacco: Never Used  Substance and Sexual Activity  . Alcohol use: Yes    Alcohol/week: 12.0 standard drinks    Types: 12 Cans of beer per week    Comment: daily  . Drug use: No  . Sexual activity: Not Currently  Other Topics Concern  . Not on file  Social History Narrative  . Not on file   Social Determinants of Health   Financial Resource Strain:   . Difficulty of Paying Living Expenses:   Food Insecurity:   . Worried About Charity fundraiser in the Last Year:   . Arboriculturist in the Last Year:   Transportation Needs:   . Film/video editor (Medical):   [name] Lack of Transportation (Non-Medical):   Physical Activity:   . Days of Exercise per Week:   . Minutes of Exercise per Session:   Stress:   . Feeling of Stress :   Social Connections:   . Frequency of Communication with Friends and Family:   . Frequency of Social Gatherings with Friends and Family:   . Attends Religious Services:   . Active Member of Clubs or Organizations:   . Attends Archivist Meetings:   [name] Marital Status:   Intimate Partner Violence:   .  Fear of Current or Ex-Partner:   . Emotionally Abused:   [name] Physically Abused:   . Sexually Abused:     No Known Allergies  Family History  Problem Relation Age of Onset  . Cancer Cousin        maternal cousin  . Cancer Cousin        paternal cousin  . Cancer Cousin   . Colon polyps Neg Hx   . Pancreatic disease Neg Hx   . Pancreatic cancer Neg Hx   . Breast cancer Neg Hx   . Colon cancer Neg Hx     Prior to Admission medications   Medication Sig Start Date End Date Taking? Authorizing Provider  amLODipine (NORVASC) 10 MG tablet Take 1 tablet (10 mg total) by mouth daily. 12/06/17 05/20/19  Shelly Coss, MD  ASPIRIN LOW DOSE 81 MG EC tablet Take 81 mg by mouth daily. 01/23/19   [provider]  atorvastatin (LIPITOR) 20 MG tablet Take 20 mg by mouth daily. 01/28/19   [provider]  docusate sodium (COLACE) 100 MG capsule Take 100 mg by mouth 2 (two) times daily.    [provider]  naproxen sodium (ALEVE) 220 MG tablet Take 220 mg by mouth daily as needed (back pain).    [provider]  olmesartan (BENICAR) 40 MG tablet Take 40 mg by mouth daily. 11/16/17   [provider]  sodium chloride 1 g tablet TK 1 T PO TID 04/09/18   [provider]    Physical Exam: Vitals:   06/01/19 9518 06/01/19 1500 06/01/19 1530 06/01/19 1545  BP: (!) 153/90 (!) 146/89 (!) 155/98 (!) 139/94  Pulse: (!) 108 (!) 103 (!) 113 (!) 102  Resp: 19 15 (!) 22 18  Temp:      TempSrc:      SpO2: 96% 95% 95% 95%     . General:  Appears calm and comfortable and is NAD . Eyes:  PERRL, EOMI, normal lids, iris . ENT:  grossly normal hearing, lips & tongue, mmm . Neck:  no LAD, masses or thyromegaly . Cardiovascular:  RR with mild tachycardia, no m/r/g. No LE edema.  [name] Respiratory:   CTA bilaterally with no wheezes/rales/rhonchi.  Normal respiratory effort.  Chest tube in place in R anterior chest. . Abdomen:  soft, NT, ND, NABS . Skin:  no rash or induration seen on limited exam . Musculoskeletal:  grossly normal tone BUE/BLE, good ROM, no bony abnormality . Psychiatric:  flat mood and affect, speech fluent and appropriate, AOx3 . Neurologic:  CN 2-12 grossly intact, moves all extremities in coordinated fashion    Radiological Exams on Admission: DG Chest Port 1 View  Result Date: 06/01/2019 CLINICAL DATA:  Post chest tube placement Pt bib ems from home with sudden onset of sob approx 1 hour pta. Given 2 neb pta, 125mg  solu medrol. Hx copd, hx spontaneous pneumopigtail catheter placement EXAM: PORTABLE CHEST 1 VIEW COMPARISON:  Radiograph 06/01/2019 at 12:30 p.m. FINDINGS: Interval placement of a small bore RIGHT chest tube. Interval expansion the lung and resolution the pneumothorax. No RIGHT pneumothorax appreciated. Small amount subcutaneous gas along  the RIGHT chest wall noted. Persistant atelectasis in the LEFT lung similar prior. Bullous change in the upper lobes. IMPRESSION: 1. Resolution of RIGHT pneumothorax following chest tube placement. 2. Bullous change in the upper lobes. 3. Small amount of gas along the RIGHT chest wall. Electronically Signed   By: Suzy Bouchard M.D.   On:  06/01/2019 13:43   DG Chest Port 1 View  Result Date: 06/01/2019 CLINICAL DATA:  Shortness of breath EXAM: PORTABLE CHEST 1 VIEW COMPARISON:  November 05, 2017 FINDINGS: There is a sizable pneumothorax on the right with mild tension component. There are scattered areas of scarring throughout the left lung. No edema or airspace opacity. Heart size is within normal limits. No adenopathy. Pulmonary vascularity reflects a degree of underlying emphysematous change. There is aortic atherosclerosis. No bone lesions. IMPRESSION: Sizable pneumothorax on the right with mild tension component. There is a degree of underlying emphysematous change. Scarring noted in the left lung. No edema or airspace opacity. Heart size within normal limits. There is aortic atherosclerosis. Aortic Atherosclerosis (ICD10-I70.0) and Emphysema (ICD10-J43.9). Critical Value/emergent results were called by telephone at the time of interpretation on 06/01/2019 at 12:37 pm to provider [facility] , who verbally acknowledged these results. Electronically Signed   By: Lowella Grip III M.D.   On: 06/01/2019 12:38    EKG: Independently reviewed.  Sinus tachycardia with rate 126; significant artifact with no evidence of acute ischemia   Labs on Admission: I have personally reviewed the available labs and imaging studies at the time of the admission.  Pertinent labs:   Na++ 130 Glucose 173 AST 71/ALT 39 HS troponin 3, 9 Normal CBC BD COVID negative; PCR pending   Assessment/Plan Principal Problem:   Recurrent spontaneous pneumothorax Active Problems:   Tobacco abuse   Alcohol abuse    Bullous emphysema ([name])   Malignant neoplasm of prostate ([name])   Recurrent spontaneous pneumothorax -Similar history in 2019 -Acute onset of SOB -Chest tube placed in the ER with apparent resolution of large right-sided PTX -Has underlying COPD with bullae - ?ruptured bullae as source -CT surgery to see and manage chest tube, Dr. Kipp Brood consulted and will see patient today.  COPD -he does not appear to be taking medications for this issue -Will order prn albuterol -Smoking cessation encouraged -Has LUL nodule previously treated with radiation, now slowly enlarging and suspicious for primary or metastatic disease - but bone scan negative  Prostate CA -Recent diagnosis -He is planning to start 5.5 week course of prostate IMRT soon  HTN -Continue Norvasc today -Resume Benicar tomorrow  HLD -Continue Lipitor  ETOH abuse -Reports several drinks per night, possibly minimizing use -He is at increased risk for complications of withdrawal including seizures, DTs -CIWA protocol -Elevated LFTs are likely related to alcoholism  Tobacco abuse -Encourage cessation.   -This was discussed with the patient and should be reviewed on an ongoing basis.   -Patch ordered at patient request.   Note: This patient has been tested and is pending for the novel coronavirus COVID-19.  DVT prophylaxis:  Lovenox  Code Status:  Full - confirmed with patient Family Communication: None present; I attempted to call his son at the time of admission but did not receive an answer Disposition Plan:  The patient is from: home  Anticipated d/c is to: home without [facility] services   Anticipated d/c date will depend on clinical response to treatment, likely 2-3 days  Patient is currently: acutely ill Consults called: CT surgery Admission status:  Admit - It is my clinical opinion that admission to [facility] is reasonable and necessary because of the expectation that this patient will require hospital care that  crosses at least 2 midnights to treat this condition based on the medical complexity of the problems presented.  Given the aforementioned information, the predictability of an adverse outcome  is felt to be significant.    Karmen Bongo MD [facility]   How to contact the [facility] Attending or Consulting provider [place] or covering provider during after hours [place], for this patient?  1. Check the care team in [facility] and look for a) attending/consulting [facility] provider listed and b) the [facility] team listed 2. Log into [email or web address] and use [facility]'s universal password to access. If you do not have the password, please contact the hospital operator. 3. Locate the [facility] provider you are looking for under [facility] and page to a number that you can be directly reached. 4. If you still have difficulty reaching the provider, please page the [facility] (Director on Call) for the Hospitalists listed on amion for assistance.   06/01/2019, 4:27 PM

## 2019-06-01 NOTE — ED Notes (Signed)
BiPAP removed. Xray called to verify chest tube placement

## 2019-06-01 NOTE — ED Notes (Signed)
Dinner Tray Ordered @ 1655.

## 2019-06-02 ENCOUNTER — Other Ambulatory Visit: Payer: Self-pay

## 2019-06-02 DIAGNOSIS — I10 Essential (primary) hypertension: Secondary | ICD-10-CM

## 2019-06-02 DIAGNOSIS — J939 Pneumothorax, unspecified: Secondary | ICD-10-CM

## 2019-06-02 DIAGNOSIS — F1010 Alcohol abuse, uncomplicated: Secondary | ICD-10-CM

## 2019-06-02 LAB — CBC
HCT: 40.1 % (ref 39.0–52.0)
Hemoglobin: 13.7 g/dL (ref 13.0–17.0)
MCH: 30.4 pg (ref 26.0–34.0)
MCHC: 34.2 g/dL (ref 30.0–36.0)
MCV: 89.1 fL (ref 80.0–100.0)
Platelets: 312 10*3/uL (ref 150–400)
RBC: 4.5 MIL/uL (ref 4.22–5.81)
RDW: 14.5 % (ref 11.5–15.5)
WBC: 13 10*3/uL — ABNORMAL HIGH (ref 4.0–10.5)
nRBC: 0 % (ref 0.0–0.2)

## 2019-06-02 LAB — BASIC METABOLIC PANEL
Anion gap: 12 (ref 5–15)
BUN: 9 mg/dL (ref 8–23)
CO2: 25 mmol/L (ref 22–32)
Calcium: 10.1 mg/dL (ref 8.9–10.3)
Chloride: 90 mmol/L — ABNORMAL LOW (ref 98–111)
Creatinine, Ser: 0.81 mg/dL (ref 0.61–1.24)
GFR calc Af Amer: >60 mL/min (ref >60)
GFR calc non Af Amer: >60 mL/min (ref >60)
Glucose, Bld: 138 mg/dL — ABNORMAL HIGH (ref 70–99)
Potassium: 4.7 mmol/L (ref 3.5–5.1)
Sodium: 127 mmol/L — ABNORMAL LOW (ref 135–145)

## 2019-06-02 LAB — OSMOLALITY, URINE: Osmolality, Ur: 667 mOsm/kg (ref 300–900)

## 2019-06-02 LAB — HIV ANTIBODY (ROUTINE TESTING W REFLEX): HIV Screen 4th Generation wRfx: NONREACTIVE

## 2019-06-02 MED ORDER — LEVALBUTEROL HCL 0.63 MG/3ML IN NEBU
0.6300 mg | INHALATION_SOLUTION | Freq: Four times a day (QID) | RESPIRATORY_TRACT | Status: DC
  Administered 2019-06-02 (×3): 0.63 mg via RESPIRATORY_TRACT
  Filled 2019-06-02 (×3): qty 3

## 2019-06-02 MED ORDER — SODIUM CHLORIDE 0.9 % IV SOLN: INTRAVENOUS | Status: DC

## 2019-06-02 MED ORDER — HYDRALAZINE HCL 20 MG/ML IJ SOLN: 10.0000 mg | INTRAMUSCULAR | Status: DC | PRN

## 2019-06-02 MED ORDER — LEVALBUTEROL HCL 0.63 MG/3ML IN NEBU: 0.6300 mg | INHALATION_SOLUTION | Freq: Four times a day (QID) | RESPIRATORY_TRACT | Status: DC | PRN

## 2019-06-02 MED ORDER — IPRATROPIUM BROMIDE 0.02 % IN SOLN
0.5000 mg | Freq: Four times a day (QID) | RESPIRATORY_TRACT | Status: DC
  Administered 2019-06-02 (×3): 0.5 mg via RESPIRATORY_TRACT
  Filled 2019-06-02 (×3): qty 2.5

## 2019-06-02 NOTE — Progress Notes (Signed)
[facility] PROGRESS NOTE   Terry Page. GGY:[id number] DOB: [birth year] DOA: 06/01/2019  PCP: [name], MD  Brief History/Interval Summary: 64 y.o. male with medical history significant of prostate CA; COPD; spontaneous PTX (2019); LUL lung nodule treated with XRT (04/2018);  and ETOH dependence presenting with SOB.  He started smoking again in the last 2 years.  He had a cigarette this AM and shortly thereafter felt SOB.  It was similar to his presentation 2 years ago.  He was found to have a pneumothorax.  Chest tube was placed.   Reason for Visit: Pneumothorax  Consultants: Cardiothoracic surgery  Procedures: Chest tube placement  Antibiotics: Anti-infectives (From admission, onward)   None      Subjective/Interval History: Patient states that he is feeling better compared to yesterday.  Has some pain around the chest tube site.  Denies any other complaints.  ROS: No fever or chills.  No nausea or vomiting.    Assessment/Plan:  Recurrent spontaneous pneumothorax/acute respiratory failure with hypoxia Similar history in 2019.  Patient had a chest tube placed in the emergency department.  Resolution of the pneumothorax noted on chest x-ray.  Cardiothoracic surgery has been consulted.  Patient is stable currently.  Currently on 2 L of oxygen.  Saturating in the mid to late 11s currently.  History of COPD/tobacco abuse Does not appear to be on any maintenance medications.  He is noted to be wheezing today.  Nebulizer treatments.  He was Xopenex instead of albuterol due to tachycardia.  Unfortunately he has started smoking again.  Counseling provided.  Left upper lobe lung nodule Previously treated with radiation.  Apparently there is some concern for malignant process due to recurrence increase in size recently.  Outpatient management.  History of prostate cancer Apparently recently diagnosed.  Outpatient management.  Essential hypertension Blood  pressure is poorly controlled.  He is noted to be on amlodipine.  Also on Avapro.  Use as needed agents as well.  Hyperlipidemia Continue Lipitor.  History of alcohol abuse Supposedly drinks several drinks per night.  He is at risk for withdrawal symptoms.  Continue CIWA.  Be elevated.  Hyponatremia Reason is unclear.  Previously noted to have low sodium values.  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  Check urine osmolality.  Gently hydrate with normal saline.  Fluid restriction.  Recheck labs tomorrow.   DVT Prophylaxis: Lovenox Code Status: Full code Family Communication: Discussed with the patient Disposition Plan:  Status is: Inpatient  Remains inpatient appropriate because:Inpatient level of care appropriate due to severity of illness and With pneumothorax requiring chest tube   Dispo: The patient is from: Home              Anticipated d/c is to: Home              Anticipated d/c date is: 3 days              Patient currently is not medically stable to d/c.     Medications:  Scheduled: . amLODipine  10 mg Oral Daily  . atorvastatin  20 mg Oral Daily  . docusate sodium  100 mg Oral BID  . enoxaparin (LOVENOX) injection  40 mg Subcutaneous Q24H  . folic acid  1 mg Oral Daily  . ipratropium  0.5 mg Nebulization Q6H  . irbesartan  300 mg Oral Daily  . levalbuterol  0.63 mg Nebulization Q6H  . multivitamin with minerals  1 tablet Oral Daily  . nicotine  14 mg Transdermal Daily  . sodium chloride flush  3 mL Intravenous Q12H  . thiamine  100 mg Oral Daily   Or  . thiamine  100 mg Intravenous Daily   Continuous: . sodium chloride     NUU:[id number] **OR** acetaminophen, bisacodyl, hydrALAZINE, HYDROcodone-acetaminophen, LORazepam **OR** LORazepam, morphine injection, ondansetron **OR** ondansetron (ZOFRAN) IV, polyethylene glycol   Objective:  Vital Signs  Vitals:   06/02/19 0757 06/02/19 0758 06/02/19 0839 06/02/19 0855  BP: (!) 179/123 (!) 180/115    Pulse: (!) 103 100     Resp: 19     Temp: 97.8 F (36.6 C)     TempSrc:      SpO2: 100% (!) 86% 98%   Weight:    73.5 kg  Height:    5\' 9"  (1.753 m)    Intake/Output Summary (Last 24 hours) at 06/02/2019 1154 Last data filed at 06/02/2019 0800 Gross per 24 hour  Intake 230 ml  Output 300 ml  Net -70 ml   Filed Weights   06/02/19 0855  Weight: 73.5 kg    General appearance: Awake alert.  In no distress Resp:  Chest tube noted.  Diminished air entry on the right side.  Scattered wheezing appreciated on both sides.  Normal effort. Cardio: S1-S2 is normal regular.  No S3-S4.  No rubs murmurs or bruit GI: Abdomen is soft.  Nontender nondistended.  Bowel sounds are present normal.  No masses organomegaly Extremities: No edema.  Full range of motion of lower extremities. Neurologic: Alert and oriented x3.  No focal neurological deficits.    Lab Results:  Data Reviewed: I have personally reviewed following labs and imaging studies  CBC: Recent Labs  Lab 06/01/19 1219 06/01/19 1232 06/02/19 0351  WBC 10.3  --  13.0*  NEUTROABS 7.5  --   --   HGB 14.9 16.7 13.7  HCT 44.0 49.0 40.1  MCV 90.3  --  89.1  PLT 339  --  259    Basic Metabolic Panel: Recent Labs  Lab 06/01/19 1219 06/01/19 1232 06/02/19 0351  NA 130* 131* 127*  K 3.6 3.7 4.7  CL 95* 94* 90*  CO2 21*  --  25  GLUCOSE 173* 182* 138*  BUN 6* 7* 9  CREATININE 0.71 0.80 0.81  CALCIUM 9.3  --  10.1    GFR: Estimated Creatinine Clearance: 90.9 mL/min (by C-G formula based on SCr of 0.81 mg/dL).  Liver Function Tests: Recent Labs  Lab 06/01/19 1219  AST 71*  ALT 39  ALKPHOS 72  BILITOT 0.9  PROT 9.1*  ALBUMIN 4.4      Recent Results (from the past 240 hour(s))  SARS Coronavirus 2 by RT PCR (hospital order, performed in [facility] hospital lab) Nasopharyngeal Nasopharyngeal Swab     Status: None   Collection Time: 06/01/19  4:32 PM   Specimen: Nasopharyngeal Swab  Result Value Ref Range Status   SARS Coronavirus  2 NEGATIVE NEGATIVE Final    Comment: (NOTE) SARS-CoV-2 target nucleic acids are NOT DETECTED. The SARS-CoV-2 RNA is generally detectable in upper and lower respiratory specimens during the acute phase of infection. The lowest concentration of SARS-CoV-2 viral copies this assay can detect is 250 copies / mL. A negative result does not preclude SARS-CoV-2 infection and should not be used as the sole basis for treatment or other patient management decisions.  A negative result may occur with improper specimen collection / handling, submission of specimen other than nasopharyngeal swab, presence of  viral mutation(s) within the areas targeted by this assay, and inadequate number of viral copies (<250 copies / mL). A negative result must be combined with clinical observations, patient history, and epidemiological information. Fact Sheet for Patients:   StrictlyIdeas.no Fact Sheet for Healthcare Providers: BankingDealers.co.za This test is not yet approved or cleared  by the [place] FDA and has been authorized for detection and/or diagnosis of SARS-CoV-2 by FDA under an Emergency Use Authorization (EUA).  This EUA will remain in effect (meaning this test can be used) for the duration of the COVID-19 declaration under Section 564(b)(1) of the Act, 21 U.S.C. section 360bbb-3(b)(1), unless the authorization is terminated or revoked sooner. Performed at [facility], [place] [address], [place], [place] [address]       Radiology Studies: DG Chest Port 1 View  Result Date: 06/01/2019 CLINICAL DATA:  Post chest tube placement Pt bib ems from home with sudden onset of sob approx 1 hour pta. Given 2 neb pta, 125mg  solu medrol. Hx copd, hx spontaneous pneumopigtail catheter placement EXAM: PORTABLE CHEST 1 VIEW COMPARISON:  Radiograph 06/01/2019 at 12:30 p.m. FINDINGS: Interval placement of a small bore RIGHT chest tube. Interval expansion the  lung and resolution the pneumothorax. No RIGHT pneumothorax appreciated. Small amount subcutaneous gas along the RIGHT chest wall noted. Persistant atelectasis in the LEFT lung similar prior. Bullous change in the upper lobes. IMPRESSION: 1. Resolution of RIGHT pneumothorax following chest tube placement. 2. Bullous change in the upper lobes. 3. Small amount of gas along the RIGHT chest wall. Electronically Signed   By: Suzy Bouchard M.D.   On: 06/01/2019 13:43   DG Chest Port 1 View  Result Date: 06/01/2019 CLINICAL DATA:  Shortness of breath EXAM: PORTABLE CHEST 1 VIEW COMPARISON:  November 05, 2017 FINDINGS: There is a sizable pneumothorax on the right with mild tension component. There are scattered areas of scarring throughout the left lung. No edema or airspace opacity. Heart size is within normal limits. No adenopathy. Pulmonary vascularity reflects a degree of underlying emphysematous change. There is aortic atherosclerosis. No bone lesions. IMPRESSION: Sizable pneumothorax on the right with mild tension component. There is a degree of underlying emphysematous change. Scarring noted in the left lung. No edema or airspace opacity. Heart size within normal limits. There is aortic atherosclerosis. Aortic Atherosclerosis (ICD10-I70.0) and Emphysema (ICD10-J43.9). Critical Value/emergent results were called by telephone at the time of interpretation on 06/01/2019 at 12:37 pm to provider [facility] , who verbally acknowledged these results. Electronically Signed   By: Lowella Grip III M.D.   On: 06/01/2019 12:38       LOS: 1 day   [facility] Pager on [email or web address]  06/02/2019, 11:54 AM

## 2019-06-02 NOTE — Consult Note (Signed)
[address]       [place] [address]             [place] Hotard Jr. [place] Record #[id number] Date of Birth: [birth year]  Referring: No ref. provider found Primary Care: [name], MD Primary Cardiologist: No primary care provider on file.  Chief Complaint:   No chief complaint on file.   History of Present Illness:    Terry Page. 64 y.o. male admitted to the hospital with a recurrent right-sided pneumothorax.  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  He was previously treated in 2019 with a right-sided spontaneous pneumothorax.  On his imaging he was noted to have significant bullous emphysema.  This morning he feels better after reexpansion of his lung.        Past Medical History:  Diagnosis Date  . Alcohol abuse   . 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 Hemorrhoids   . Incidental lung nodule, greater than or equal to 41mm 10/22/2017   Left upper lobe - discovered on CTA  . Prostate cancer ([place])   . Spontaneous pneumothorax 10/20/2017   right  . Tobacco abuse     Past Surgical History:  Procedure Laterality Date  . BACK SURGERY    . PROSTATE BIOPSY      Family History  Problem Relation Age of Onset  . Cancer Cousin        maternal cousin  . Cancer Cousin        paternal cousin  . Cancer Cousin   . Colon polyps Neg Hx   . Pancreatic disease Neg Hx   . Pancreatic cancer Neg Hx   . Breast cancer Neg Hx   . Colon cancer Neg Hx      Social History   Tobacco Use  Smoking Status Current Every Day Smoker  . Packs/day: 0.50  . Years: 52.00  . Pack years: 26.00  . Types: Cigarettes  Smokeless Tobacco Never Used    Social History   Substance and Sexual Activity  Alcohol Use Yes  . Alcohol/week: 12.0 standard drinks  . Types: 12 Cans of beer per week   Comment: daily     No Known  Allergies  Current Facility-Administered Medications  Medication Dose Route Frequency Provider Last Rate Last Admin  . 0.9 %  sodium chloride infusion   Intravenous Continuous Bonnielee Haff, MD      . acetaminophen (TYLENOL) tablet 650 mg  650 mg Oral Q6H PRN Karmen Bongo, MD       Or  . acetaminophen (TYLENOL) suppository 650 mg  650 mg Rectal Q6H PRN Karmen Bongo, MD      . amLODipine (NORVASC) tablet 10 mg  10 mg Oral Daily Karmen Bongo, MD      . atorvastatin (LIPITOR) tablet 20 mg  20 mg Oral Daily Karmen Bongo, MD      . bisacodyl (DULCOLAX) EC tablet 5 mg  5 mg Oral Daily PRN Karmen Bongo, MD      . docusate sodium (COLACE) capsule 100 mg  100 mg Oral BID Karmen Bongo, MD   100 mg at 06/01/19 2139  . enoxaparin (LOVENOX) injection 40 mg  40 mg Subcutaneous Q24H Karmen Bongo, MD  40 mg at 06/01/19 1711  . folic acid (FOLVITE) tablet 1 mg  1 mg Oral Daily Karmen Bongo, MD   1 mg at 06/01/19 1712  . hydrALAZINE (APRESOLINE) injection 5 mg  5 mg Intravenous Q4H PRN Karmen Bongo, MD   5 mg at 06/01/19 1808  . HYDROcodone-acetaminophen (NORCO/VICODIN) 5-325 MG per tablet 1-2 tablet  1-2 tablet Oral Q4H PRN Karmen Bongo, MD   2 tablet at 06/01/19 2139  . ipratropium (ATROVENT) nebulizer solution 0.5 mg  0.5 mg Nebulization Q6H Bonnielee Haff, MD      . irbesartan Levy Sjogren) tablet 300 mg  300 mg Oral Daily Karmen Bongo, MD      . levalbuterol Penne Lash) nebulizer solution 0.63 mg  0.63 mg Nebulization Q6H Bonnielee Haff, MD      . LORazepam (ATIVAN) tablet 1-4 mg  1-4 mg Oral Q1H PRN Karmen Bongo, MD   1 mg at 06/01/19 1713   Or  . LORazepam (ATIVAN) injection 1-4 mg  1-4 mg Intravenous Q1H PRN Karmen Bongo, MD   2 mg at 06/01/19 1818  . morphine 2 MG/ML injection 2 mg  2 mg Intravenous Q2H PRN Karmen Bongo, MD   2 mg at 06/01/19 0867  . multivitamin with minerals tablet 1 tablet  1 tablet Oral Daily Karmen Bongo, MD   1 tablet at 06/01/19 1712   . nicotine (NICODERM CQ - dosed in mg/24 hours) patch 14 mg  14 mg Transdermal Daily Karmen Bongo, MD   14 mg at 06/01/19 1710  . ondansetron (ZOFRAN) tablet 4 mg  4 mg Oral Q6H PRN Karmen Bongo, MD       Or  . ondansetron [facility]) injection 4 mg  4 mg Intravenous Q6H PRN Karmen Bongo, MD      . polyethylene glycol (MIRALAX / GLYCOLAX) packet 17 g  17 g Oral Daily PRN Karmen Bongo, MD      . sodium chloride flush (NS) 0.9 % injection 3 mL  3 mL Intravenous Q12H Karmen Bongo, MD   3 mL at 06/01/19 2141  . thiamine tablet 100 mg  100 mg Oral Daily Karmen Bongo, MD   100 mg at 06/01/19 1713   Or  . thiamine (B-1) injection 100 mg  100 mg Intravenous Daily Karmen Bongo, MD        Review of Systems  Constitutional: Positive for malaise/fatigue.  Respiratory: Positive for cough and shortness of breath.   Cardiovascular: Negative.      PHYSICAL EXAMINATION: BP (!) 180/115   Pulse 100   Temp 97.8 F (36.6 C)   Resp 19   SpO2 (!) 86%  Physical Exam  Constitutional: He is oriented to person, place, and time. No distress.  HENT:  Head: Normocephalic and atraumatic.  Respiratory: No respiratory distress.  Wet cough, coarse breath sounds Chest tube in position.  Currently on suction no evidence of air leak with cough.  Musculoskeletal:        General: Normal range of motion.  Neurological: He is alert and oriented to person, place, and time.  Skin: He is not diaphoretic.    Diagnostic Studies & Laboratory data:     Recent Radiology Findings:   NM Bone Scan Whole Body  Result Date: 05/27/2019 CLINICAL DATA:  Prostate cancer. EXAM: NUCLEAR MEDICINE WHOLE BODY BONE SCAN TECHNIQUE: Whole body anterior and posterior images were obtained approximately 3 hours after intravenous injection of radiopharmaceutical. RADIOPHARMACEUTICALS:  21.1 mCi Technetium-47m MDP IV COMPARISON:  PET CT 12/21/2017. FINDINGS: Bilateral renal  function excretion. Mild increased activity both  hips, right side greater than left. Disc car spine degenerative change noted on recent PET-CT. Mild increased activity both shoulders, bilateral upper costochondral regions, and sternomanubrial region most likely degenerative. Degenerative changes noted this region on recent PET-CT. IMPRESSION: Findings consistent degenerative change as above. No evidence of metastatic disease. Electronically Signed   By: Marcello Moores  Register   On: 05/27/2019 06:19   DG Chest Port 1 View  Result Date: 06/01/2019 CLINICAL DATA:  Post chest tube placement Pt bib ems from home with sudden onset of sob approx 1 hour pta. Given 2 neb pta, 125mg  solu medrol. Hx copd, hx spontaneous pneumopigtail catheter placement EXAM: PORTABLE CHEST 1 VIEW COMPARISON:  Radiograph 06/01/2019 at 12:30 p.m. FINDINGS: Interval placement of a small bore RIGHT chest tube. Interval expansion the lung and resolution the pneumothorax. No RIGHT pneumothorax appreciated. Small amount subcutaneous gas along the RIGHT chest wall noted. Persistant atelectasis in the LEFT lung similar prior. Bullous change in the upper lobes. IMPRESSION: 1. Resolution of RIGHT pneumothorax following chest tube placement. 2. Bullous change in the upper lobes. 3. Small amount of gas along the RIGHT chest wall. Electronically Signed   By: Suzy Bouchard M.D.   On: 06/01/2019 13:43   DG Chest Port 1 View  Result Date: 06/01/2019 CLINICAL DATA:  Shortness of breath EXAM: PORTABLE CHEST 1 VIEW COMPARISON:  November 05, 2017 FINDINGS: There is a sizable pneumothorax on the right with mild tension component. There are scattered areas of scarring throughout the left lung. No edema or airspace opacity. Heart size is within normal limits. No adenopathy. Pulmonary vascularity reflects a degree of underlying emphysematous change. There is aortic atherosclerosis. No bone lesions. IMPRESSION: Sizable pneumothorax on the right with mild tension component. There is a degree of underlying  emphysematous change. Scarring noted in the left lung. No edema or airspace opacity. Heart size within normal limits. There is aortic atherosclerosis. Aortic Atherosclerosis (ICD10-I70.0) and Emphysema (ICD10-J43.9). Critical Value/emergent results were called by telephone at the time of interpretation on 06/01/2019 at 12:37 pm to provider [facility] , who verbally acknowledged these results. Electronically Signed   By: Lowella Grip III M.D.   On: 06/01/2019 12:38       I have independently reviewed the above radiology studies  and reviewed the findings with the patient.   Recent Lab Findings: Lab Results  Component Value Date   WBC 13.0 (H) 06/02/2019   HGB 13.7 06/02/2019   HCT 40.1 06/02/2019   PLT 312 06/02/2019   GLUCOSE 138 (H) 06/02/2019   ALT 39 06/01/2019   AST 71 (H) 06/01/2019   NA 127 (L) 06/02/2019   K 4.7 06/02/2019   CL 90 (L) 06/02/2019   CREATININE 0.81 06/02/2019   BUN 9 06/02/2019   CO2 25 06/02/2019      Assessment / Plan:   64 year old male with a right-sided recurrent spontaneous pneumothorax in the setting of significant bullous emphysema.  He also has a history of SBRT to a left-sided pulmonary nodule.  Based off of his imaging he would be a very high risk for surgical intervention for treatment of his recurrent pneumothorax.,  Recommend that we continue with chest tube management.  We can transition to waterseal tomorrow.  If he redevelops the pneumothorax on waterseal we can consider performing a chemical pleurodesis.     I  spent 20 minutes with  the patient face to face and greater then 50% of the time was spent  in counseling and coordination of care.    Lajuana Matte 06/02/2019 8:34 AM

## 2019-06-03 ENCOUNTER — Inpatient Hospital Stay (HOSPITAL_COMMUNITY): Payer: Medicare HMO

## 2019-06-03 DIAGNOSIS — E871 Hypo-osmolality and hyponatremia: Secondary | ICD-10-CM

## 2019-06-03 LAB — COMPREHENSIVE METABOLIC PANEL
ALT: 25 U/L (ref 0–44)
AST: 34 U/L (ref 15–41)
Albumin: 3.8 g/dL (ref 3.5–5.0)
Alkaline Phosphatase: 57 U/L (ref 38–126)
Anion gap: 10 (ref 5–15)
BUN: 11 mg/dL (ref 8–23)
CO2: 25 mmol/L (ref 22–32)
Calcium: 9.6 mg/dL (ref 8.9–10.3)
Chloride: 91 mmol/L — ABNORMAL LOW (ref 98–111)
Creatinine, Ser: 0.71 mg/dL (ref 0.61–1.24)
GFR calc Af Amer: >60 mL/min (ref >60)
GFR calc non Af Amer: >60 mL/min (ref >60)
Glucose, Bld: 107 mg/dL — ABNORMAL HIGH (ref 70–99)
Potassium: 4.5 mmol/L (ref 3.5–5.1)
Sodium: 126 mmol/L — ABNORMAL LOW (ref 135–145)
Total Bilirubin: 0.9 mg/dL (ref 0.3–1.2)
Total Protein: 7.9 g/dL (ref 6.5–8.1)

## 2019-06-03 LAB — URINALYSIS, ROUTINE W REFLEX MICROSCOPIC
Bilirubin Urine: NEGATIVE
Glucose, UA: NEGATIVE mg/dL
Hgb urine dipstick: NEGATIVE
Ketones, ur: NEGATIVE mg/dL
Leukocytes,Ua: NEGATIVE
Nitrite: NEGATIVE
Protein, ur: NEGATIVE mg/dL
Specific Gravity, Urine: 1.015 (ref 1.005–1.030)
pH: 7 (ref 5.0–8.0)

## 2019-06-03 LAB — CBC
HCT: 40.9 % (ref 39.0–52.0)
Hemoglobin: 14.1 g/dL (ref 13.0–17.0)
MCH: 30.7 pg (ref 26.0–34.0)
MCHC: 34.5 g/dL (ref 30.0–36.0)
MCV: 89.1 fL (ref 80.0–100.0)
Platelets: 314 10*3/uL (ref 150–400)
RBC: 4.59 MIL/uL (ref 4.22–5.81)
RDW: 14.4 % (ref 11.5–15.5)
WBC: 17.8 10*3/uL — ABNORMAL HIGH (ref 4.0–10.5)
nRBC: 0 % (ref 0.0–0.2)

## 2019-06-03 LAB — MAGNESIUM: Magnesium: 2.1 mg/dL (ref 1.7–2.4)

## 2019-06-03 MED ORDER — SODIUM CHLORIDE 0.9 % IV SOLN: INTRAVENOUS | Status: DC

## 2019-06-03 MED ORDER — SODIUM CHLORIDE 1 G PO TABS
1.0000 g | ORAL_TABLET | Freq: Three times a day (TID) | ORAL | Status: DC
  Administered 2019-06-03 – 2019-06-06 (×9): 1 g via ORAL
  Filled 2019-06-03 (×12): qty 1

## 2019-06-03 NOTE — Plan of Care (Signed)
°  Problem: Clinical Measurements: Goal: Ability to maintain clinical measurements within normal limits will improve Outcome: Progressing Goal: Will remain free from infection Outcome: Progressing Goal: Diagnostic test results will improve Outcome: Progressing Goal: Respiratory complications will improve Outcome: Progressing Goal: Cardiovascular complication will be avoided Outcome: Progressing   Problem: Activity: Goal: Risk for activity intolerance will decrease Outcome: Progressing   Problem: Nutrition: Goal: Adequate nutrition will be maintained Outcome: Progressing   Problem: Coping: Goal: Level of anxiety will decrease Outcome: Progressing   Problem: Elimination: Goal: Will not experience complications related to bowel motility Outcome: Progressing Goal: Will not experience complications related to urinary retention Outcome: Progressing   Problem: Pain Managment: Goal: General experience of comfort will improve Outcome: Progressing   Problem: Safety: Goal: Ability to remain free from injury will improve Outcome: Progressing

## 2019-06-03 NOTE — Progress Notes (Addendum)
[facility] PROGRESS NOTE   Terry Page. KGS:[id number] DOB: 1955-11-09 DOA: 06/01/2019  PCP: [name], MD  Brief History/Interval Summary: 64 y.o. male with medical history significant of prostate CA; COPD; spontaneous PTX (2019); LUL lung nodule treated with XRT (04/2018);  and ETOH dependence presenting with SOB.  He started smoking again in the last 2 years.  He had a cigarette this AM and shortly thereafter felt SOB.  It was similar to his presentation 2 years ago.  He was found to have a pneumothorax.  Chest tube was placed.   Reason for Visit: Pneumothorax  Consultants: Cardiothoracic surgery  Procedures: Chest tube placement  Antibiotics: Anti-infectives (From admission, onward)   None      Subjective/Interval History: Patient continues to have some pain at the site of his chest tube.  No other complaints offered.  Overall he feels better.  ROS: Denies any nausea or vomiting.    Assessment/Plan:  Recurrent spontaneous pneumothorax/acute respiratory failure with hypoxia Similar history in 2019.  Patient had a chest tube placed in the emergency department.  Resolution of the pneumothorax noted on chest x-ray.   Cardiothoracic surgery is following.  Currently on 2 to 3 L of oxygen.  Stable.    History of COPD/tobacco abuse Does not appear to be on any maintenance medications.  He was noted to be wheezing yesterday.  Started on Xopenex nebulizers with improvement.  Continue to monitor.    Hyponatremia This has been noted previously as well.  His urine osmolality is noted to be elevated.  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  Due to concern for hypovolemia he was ordered IV fluids yesterday however apparently did this did not start till about 3:00 this morning. Salt tablets.  Recheck tomorrow.    Leukocytosis WBC noted to be 17.8 this morning.  He is afebrile.  Reason for this is not entirely clear.  Showed only atelectasis apart from the pneumothorax.  No clear  infectious source identified based on history and examination.  Can go ahead with a UA.  Recheck tomorrow.  Nonsustained VT 7 beat run of VT noted on telemetry.  Patient was asymptomatic.  Potassium was normal.  Patient denies any chest pain.  Check magnesium level.  Continue to monitor on telemetry.  EKG done on admission shows sinus tachycardia.  We will recheck.  Left upper lobe lung nodule Previously treated with radiation.  Apparently there is some concern for malignant process due to recurrence increase in size recently.  Outpatient management.  History of prostate cancer Apparently recently diagnosed.  Outpatient management.  Essential hypertension Patient on amlodipine and Avapro.  Blood pressure seems to be slightly better controlled.  Continue to monitor.    Hyperlipidemia Continue Lipitor.  History of alcohol abuse Supposedly drinks several drinks per night.  He is at risk for withdrawal symptoms.  Continue CIWA.  Continue to monitor.  So far no signs of withdrawal.   DVT Prophylaxis: Lovenox Code Status: Full code Family Communication: Discussed with the patient Disposition Plan:  Status is: Inpatient  Remains inpatient appropriate because:Inpatient level of care appropriate due to severity of illness and With pneumothorax requiring chest tube   Dispo: The patient is from: Home              Anticipated d/c is to: Home              Anticipated d/c date is: 3 days  Patient currently is not medically stable to d/c.     Medications:  Scheduled: . amLODipine  10 mg Oral Daily  . atorvastatin  20 mg Oral Daily  . docusate sodium  100 mg Oral BID  . enoxaparin (LOVENOX) injection  40 mg Subcutaneous Q24H  . folic acid  1 mg Oral Daily  . irbesartan  300 mg Oral Daily  . multivitamin with minerals  1 tablet Oral Daily  . nicotine  14 mg Transdermal Daily  . sodium chloride flush  3 mL Intravenous Q12H  . sodium chloride  1 g Oral TID WC  . thiamine   100 mg Oral Daily   Or  . thiamine  100 mg Intravenous Daily   Continuous:  CXK:GYJEHUDJSHFWY **OR** acetaminophen, bisacodyl, hydrALAZINE, HYDROcodone-acetaminophen, levalbuterol, LORazepam **OR** LORazepam, morphine injection, ondansetron **OR** ondansetron (ZOFRAN) IV, polyethylene glycol   Objective:  Vital Signs  Vitals:   06/02/19 2018 06/02/19 2226 06/03/19 0419 06/03/19 0746  BP:  (!) 159/98 (!) 154/102 (!) 136/110  Pulse: 96 95 94 89  Resp: 20 20  19   Temp:  (!) 97.5 F (36.4 C) 98.4 F (36.9 C) 97.6 F (36.4 C)  TempSrc:   Oral   SpO2: 95% 99% 97% 98%  Weight:      Height:        Intake/Output Summary (Last 24 hours) at 06/03/2019 1020 Last data filed at 06/03/2019 0855 Gross per 24 hour  Intake 459.24 ml  Output 775 ml  Net -315.76 ml   Filed Weights   06/02/19 0855  Weight: 73.5 kg    General appearance: Awake alert.  In no distress Resp: Chest tube noted on the right.  Good air entry bilaterally.  No wheezing heard today. Cardio: S1-S2 is normal regular.  No S3-S4.  No rubs murmurs or bruit GI: Abdomen is soft.  Nontender nondistended.  Bowel sounds are present normal.  No masses organomegaly Extremities: No edema.  Full range of motion of lower extremities. Neurologic: Alert and oriented x3.  No focal neurological deficits.   Lab Results:  Data Reviewed: I have personally reviewed following labs and imaging studies  CBC: Recent Labs  Lab 06/01/19 1219 06/01/19 1232 06/02/19 0351 06/03/19 0251  WBC 10.3  --  13.0* 17.8*  NEUTROABS 7.5  --   --   --   HGB 14.9 16.7 13.7 14.1  HCT 44.0 49.0 40.1 40.9  MCV 90.3  --  89.1 89.1  PLT 339  --  312 637    Basic Metabolic Panel: Recent Labs  Lab 06/01/19 1219 06/01/19 1232 06/02/19 0351 06/03/19 0251  NA 130* 131* 127* 126*  K 3.6 3.7 4.7 4.5  CL 95* 94* 90* 91*  CO2 21*  --  25 25  GLUCOSE 173* 182* 138* 107*  BUN 6* 7* 9 11  CREATININE 0.71 0.80 0.81 0.71  CALCIUM 9.3  --  10.1 9.6     GFR: Estimated Creatinine Clearance: 92.1 mL/min (by C-G formula based on SCr of 0.71 mg/dL).  Liver Function Tests: Recent Labs  Lab 06/01/19 1219 06/03/19 0251  AST 71* 34  ALT 39 25  ALKPHOS 72 57  BILITOT 0.9 0.9  PROT 9.1* 7.9  ALBUMIN 4.4 3.8      Recent Results (from the past 240 hour(s))  SARS Coronavirus 2 by RT PCR (hospital order, performed in [facility] hospital lab) Nasopharyngeal Nasopharyngeal Swab     Status: None   Collection Time: 06/01/19  4:32 PM  Specimen: Nasopharyngeal Swab  Result Value Ref Range Status   SARS Coronavirus 2 NEGATIVE NEGATIVE Final    Comment: (NOTE) SARS-CoV-2 target nucleic acids are NOT DETECTED. The SARS-CoV-2 RNA is generally detectable in upper and lower respiratory specimens during the acute phase of infection. The lowest concentration of SARS-CoV-2 viral copies this assay can detect is 250 copies / mL. A negative result does not preclude SARS-CoV-2 infection and should not be used as the sole basis for treatment or other patient management decisions.  A negative result may occur with improper specimen collection / handling, submission of specimen other than nasopharyngeal swab, presence of viral mutation(s) within the areas targeted by this assay, and inadequate number of viral copies (<250 copies / mL). A negative result must be combined with clinical observations, patient history, and epidemiological information. Fact Sheet for Patients:   StrictlyIdeas.no Fact Sheet for Healthcare Providers: BankingDealers.co.za This test is not yet approved or cleared  by the [place] FDA and has been authorized for detection and/or diagnosis of SARS-CoV-2 by FDA under an Emergency Use Authorization (EUA).  This EUA will remain in effect (meaning this test can be used) for the duration of the COVID-19 declaration under Section 564(b)(1) of the Act, 21 U.S.C. section  360bbb-3(b)(1), unless the authorization is terminated or revoked sooner. Performed at [facility], [place] [address] [place], [place] [address]       Radiology Studies: DG Chest Port 1 View  Result Date: 06/01/2019 CLINICAL DATA:  Post chest tube placement Pt bib ems from home with sudden onset of sob approx 1 hour pta. Given 2 neb pta, 125mg  solu medrol. Hx copd, hx spontaneous pneumopigtail catheter placement EXAM: PORTABLE CHEST 1 VIEW COMPARISON:  Radiograph 06/01/2019 at 12:30 p.m. FINDINGS: Interval placement of a small bore RIGHT chest tube. Interval expansion the lung and resolution the pneumothorax. No RIGHT pneumothorax appreciated. Small amount subcutaneous gas along the RIGHT chest wall noted. Persistant atelectasis in the LEFT lung similar prior. Bullous change in the upper lobes. IMPRESSION: 1. Resolution of RIGHT pneumothorax following chest tube placement. 2. Bullous change in the upper lobes. 3. Small amount of gas along the RIGHT chest wall. Electronically Signed   By: Suzy Bouchard M.D.   On: 06/01/2019 13:43   DG Chest Port 1 View  Result Date: 06/01/2019 CLINICAL DATA:  Shortness of breath EXAM: PORTABLE CHEST 1 VIEW COMPARISON:  November 05, 2017 FINDINGS: There is a sizable pneumothorax on the right with mild tension component. There are scattered areas of scarring throughout the left lung. No edema or airspace opacity. Heart size is within normal limits. No adenopathy. Pulmonary vascularity reflects a degree of underlying emphysematous change. There is aortic atherosclerosis. No bone lesions. IMPRESSION: Sizable pneumothorax on the right with mild tension component. There is a degree of underlying emphysematous change. Scarring noted in the left lung. No edema or airspace opacity. Heart size within normal limits. There is aortic atherosclerosis. Aortic Atherosclerosis (ICD10-I70.0) and Emphysema (ICD10-J43.9). Critical Value/emergent results were called by telephone at  the time of interpretation on 06/01/2019 at 12:37 pm to provider [facility] , who verbally acknowledged these results. Electronically Signed   By: Lowella Grip III M.D.   On: 06/01/2019 12:38       LOS: 2 days   [facility] Pager on [email or web address]  06/03/2019, 10:20 AM

## 2019-06-03 NOTE — Progress Notes (Signed)
     [address]       [place] [address]             [phone]       No events. We will transition to waterseal on chest.  Lajuana Matte

## 2019-06-03 NOTE — Progress Notes (Signed)
[facility] notified that patient had 7 beat run of VTach bp 154/102 hr 94 right side chest discomfort with chest tube. Tylenol 650mg  given. No new orders at this time. Will continue to monitor. Arthor Captain LPN

## 2019-06-04 ENCOUNTER — Inpatient Hospital Stay (HOSPITAL_COMMUNITY): Payer: Medicare HMO

## 2019-06-04 DIAGNOSIS — J9383 Other pneumothorax: Secondary | ICD-10-CM

## 2019-06-04 LAB — CBC WITH DIFFERENTIAL/PLATELET
Abs Immature Granulocytes: 0.05 10*3/uL (ref 0.00–0.07)
Basophils Absolute: 0.1 10*3/uL (ref 0.0–0.1)
Basophils Relative: 0 %
Eosinophils Absolute: 0.2 10*3/uL (ref 0.0–0.5)
Eosinophils Relative: 2 %
HCT: 38.7 % — ABNORMAL LOW (ref 39.0–52.0)
Hemoglobin: 13.1 g/dL (ref 13.0–17.0)
Immature Granulocytes: 0 %
Lymphocytes Relative: 14 %
Lymphs Abs: 1.7 10*3/uL (ref 0.7–4.0)
MCH: 30.5 pg (ref 26.0–34.0)
MCHC: 33.9 g/dL (ref 30.0–36.0)
MCV: 90 fL (ref 80.0–100.0)
Monocytes Absolute: 1.1 10*3/uL — ABNORMAL HIGH (ref 0.1–1.0)
Monocytes Relative: 9 %
Neutro Abs: 8.6 10*3/uL — ABNORMAL HIGH (ref 1.7–7.7)
Neutrophils Relative %: 75 %
Platelets: 288 10*3/uL (ref 150–400)
RBC: 4.3 MIL/uL (ref 4.22–5.81)
RDW: 14.4 % (ref 11.5–15.5)
WBC: 11.6 10*3/uL — ABNORMAL HIGH (ref 4.0–10.5)
nRBC: 0 % (ref 0.0–0.2)

## 2019-06-04 LAB — BASIC METABOLIC PANEL
Anion gap: 9 (ref 5–15)
BUN: 14 mg/dL (ref 8–23)
CO2: 24 mmol/L (ref 22–32)
Calcium: 9.3 mg/dL (ref 8.9–10.3)
Chloride: 94 mmol/L — ABNORMAL LOW (ref 98–111)
Creatinine, Ser: 0.75 mg/dL (ref 0.61–1.24)
GFR calc Af Amer: >60 mL/min (ref >60)
GFR calc non Af Amer: >60 mL/min (ref >60)
Glucose, Bld: 98 mg/dL (ref 70–99)
Potassium: 4.3 mmol/L (ref 3.5–5.1)
Sodium: 127 mmol/L — ABNORMAL LOW (ref 135–145)

## 2019-06-04 LAB — OSMOLALITY, URINE: Osmolality, Ur: 673 mOsm/kg (ref 300–900)

## 2019-06-04 LAB — SODIUM, URINE, RANDOM: Sodium, Ur: 161 mmol/L

## 2019-06-04 NOTE — Evaluation (Signed)
Physical Therapy Evaluation Patient Details Name: Terry Page. MRN: [id number] DOB: 1955-03-29 Today's Date: 06/04/2019   History of Present Illness  Pt is 64 yo male with PMH including prostate CA, COPD, L UL lung nodule treated with XRT 04/2018, and ETOH dependence.  Pt presented to ED with [facility] and found to have recurrent spontaneous pneumothorax and had chest tube placed on 06/01/19.  Clinical Impression  Pt admitted with above diagnosis. Pt did well with PT at evaluation.  He needed min cues for safety and VSS on 3 LPM O2.  He ambulated 200' with DOE of 2/4.   Pt does still have chest tube and presented with mild deficits in strength, endurance, balance, and mobility.  Pt will benefit from acute PT to progress mobility with vitals monitored.  Likely able to progress to no AD - used RW today for chest tube management.  Pt currently with functional limitations due to the deficits listed below (see PT Problem List). Pt will benefit from skilled PT to increase their independence and safety with mobility to allow discharge to the venue listed below.       Follow Up Recommendations Supervision - Intermittent;No PT follow up    Equipment Recommendations  None recommended by PT    Recommendations for Other Services       Precautions / Restrictions Precautions Precautions: Other (comment) Precaution Comments: chest tube      Mobility  Bed Mobility Overal bed mobility: Needs Assistance Bed Mobility: Supine to Sit;Sit to Supine     Supine to sit: Min guard;HOB elevated Sit to supine: Min guard;HOB elevated   General bed mobility comments: good safety awareness with chest tube  Transfers Overall transfer level: Needs assistance Equipment used: Rolling walker (2 wheeled) Transfers: Sit to/from Stand Sit to Stand: Min guard         General transfer comment: cues for hand placement; performed x 2  Ambulation/Gait Ambulation/Gait assistance: Min guard Gait Distance (Feet):  200 Feet Assistive device: Rolling walker (2 wheeled) Gait Pattern/deviations: Step-through pattern;Decreased stride length Gait velocity: decreased   General Gait Details: min cues for RW proximity; Pt on 3 LPM O2 with sats 95%; did fatigue after 200' with DOE of 2/4  Stairs            Wheelchair Mobility    Modified Rankin (Stroke Patients Only)       Balance Overall balance assessment: Needs assistance Sitting-balance support: No upper extremity supported Sitting balance-Leahy Scale: Normal     Standing balance support: No upper extremity supported Standing balance-Leahy Scale: Good                               Pertinent Vitals/Pain Pain Assessment: 0-10 Pain Score: 1  Pain Location: back sore from laying Pain Descriptors / Indicators: Sore Pain Intervention(s): Limited activity within patient's tolerance;Repositioned    Home Living Family/patient expects to be discharged to:: Private residence Living Arrangements: Alone Available Help at Discharge: Available PRN/intermittently;Family Type of Home: Apartment Home Access: Level entry     Home Layout: One level Home Equipment: Grab bars - tub/shower;None      Prior Function Level of Independence: Independent         Comments: Pt reports independence with IADLs, ADLs, driving, and community ambulation.  Does not have O2 at home.     Hand Dominance        Extremity/Trunk Assessment   Upper Extremity Assessment Upper  Extremity Assessment: Overall WFL for tasks assessed    Lower Extremity Assessment Lower Extremity Assessment: Overall WFL for tasks assessed    Cervical / Trunk Assessment Cervical / Trunk Assessment: Normal  Communication   Communication: No difficulties  Cognition Arousal/Alertness: Awake/alert Behavior During Therapy: WFL for tasks assessed/performed Overall Cognitive Status: Within Functional Limits for tasks assessed                                         General Comments General comments (skin integrity, edema, etc.): Pt on 3 LPM O2 with VSS during therapy    Exercises     Assessment/Plan    PT Assessment Patient needs continued PT services  PT Problem List Decreased strength;Decreased mobility;Decreased activity tolerance;Decreased balance;Decreased knowledge of use of DME;Cardiopulmonary status limiting activity       PT Treatment Interventions DME instruction;Therapeutic activities;Gait training;Therapeutic exercise;Patient/family education;Balance training;Functional mobility training    PT Goals (Current goals can be found in the Care Plan section)  Acute Rehab PT Goals Patient Stated Goal: return home PT Goal Formulation: With patient Time For Goal Achievement: 06/18/19 Potential to Achieve Goals: Good    Frequency Min 3X/week   Barriers to discharge Decreased caregiver support      Co-evaluation               AM-PAC PT "6 Clicks" Mobility  Outcome Measure Help needed turning from your back to your side while in a flat bed without using bedrails?: A Little Help needed moving from lying on your back to sitting on the side of a flat bed without using bedrails?: A Little Help needed moving to and from a bed to a chair (including a wheelchair)?: None Help needed standing up from a chair using your arms (e.g., wheelchair or bedside chair)?: None Help needed to walk in hospital room?: None Help needed climbing 3-5 steps with a railing? : A Little 6 Click Score: 21    End of Session Equipment Utilized During Treatment: Gait belt;Oxygen Activity Tolerance: Patient tolerated treatment well Patient left: in bed;with call bell/phone within reach;with bed alarm set Nurse Communication: Mobility status PT Visit Diagnosis: Other abnormalities of gait and mobility (R26.89)    Time: 0109-3235 PT Time Calculation (min) (ACUTE ONLY): 22 min   Charges:   PT Evaluation $PT Eval Moderate Complexity: 1  Mod          Maggie Font, PT Acute Rehab Services Pager [phone] [facility] [phone] [facility] [phone]   Karlton Lemon 06/04/2019, 5:33 PM

## 2019-06-04 NOTE — Progress Notes (Signed)
PROGRESS NOTE    Terry Page.  UXN:[id number] DOB: 03-Jun-1955 DOA: 06/01/2019 PCP: [name], MD   No chief complaint on file.  Brief Narrative:  64 y.o.malewith medical history significant ofprostate CA; COPD; spontaneous PTX (2019); LUL lung nodule treated with XRT (04/2018); and ETOH dependence presenting with SOB. He started smoking again in the last 2 years. He had Terry Page cigarette this AM and shortly thereafter felt SOB. It was similar to his presentation 2 years ago.  He was found to have Terry Page pneumothorax.  Chest tube was placed.  Assessment & Plan:   Principal Problem:   Recurrent spontaneous pneumothorax Active Problems:   Tobacco abuse   Alcohol abuse   Bullous emphysema ([name])   Malignant neoplasm of prostate ([place])  Recurrent spontaneous pneumothorax/acute respiratory failure with hypoxia Similar history in 2019.  Patient had Terry Page chest tube placed in the emergency department.   CXR 6/2 with chest tube without pneumothorax Cardiothoracic surgery is following.  Currently on 2 to 3 L of oxygen.  Stable.   Chest tube per CT surgery  History of COPD/tobacco abuse Does not appear to be on any maintenance medications.  He was noted to be wheezing yesterday.  Started on Xopenex nebulizers with improvement.  Continue to monitor.    Hyponatremia Repeat urine studies (urine sodium >100, urine osm 670), he appears relatively euvolemic  Appears c/w SIADH Continue salt tabs and fluid restriction Will continue to monitor   Leukocytosis Improved, continue to monitor UA negative  Nonsustained VT 7 beat run of VT noted on telemetry.  Patient was asymptomatic.  Potassium was normal.  Patient denies any chest pain.  Check magnesium level.  Continue to monitor on telemetry.  Repeat EKG from 6/1 with NSR.  Left upper lobe lung nodule Previously treated with radiation.  Apparently there is some concern for malignant process due to recurrence increase in size recently.   Outpatient management.  History of prostate cancer Apparently recently diagnosed.  Outpatient management.  Essential hypertension Patient on amlodipine and Avapro.  Appropriate today, continue to monitor.  Hyperlipidemia Continue Lipitor.  History of alcohol abuse Supposedly drinks several drinks per night.  He is at risk for withdrawal symptoms.  Continue CIWA.  Continue to monitor.  So far no signs of withdrawal.  DVT prophylaxis: lovenox Code Status:full  Family Communication: none at bedside Disposition:   Status is: Inpatient  Remains inpatient appropriate because:Inpatient level of care appropriate due to severity of illness   Dispo: The patient is from: Home              Anticipated d/c is to: Home              Anticipated d/c date is: > 3 days              Patient currently is not medically stable to d/c.   Consultants:   CT surgery  Procedures:   Chest tube insertion 5/30  Antimicrobials:  Anti-infectives (From admission, onward)   None      Subjective: No new complaints today No SOB, some CP around chest tube site  Objective: Vitals:   06/03/19 1701 06/03/19 2215 06/04/19 0847 06/04/19 0940  BP: (!) 155/99 127/90 113/88 113/88  Pulse: (!) 102 89 94 94  Resp: 19 20    Temp: 97.6 F (36.4 C) 98 F (36.7 C) (!) 97.5 F (36.4 C)   TempSrc:      SpO2: 100% 98% 98%   Weight:  Height:        Intake/Output Summary (Last 24 hours) at 06/04/2019 1216 Last data filed at 06/04/2019 0851 Gross per 24 hour  Intake 358 ml  Output 700 ml  Net -342 ml   Filed Weights   06/02/19 0855  Weight: 73.5 kg    Examination:  General exam: Appears calm and comfortable  Respiratory system: Clear to auscultation. Respiratory effort normal.  R sided chest tube. Cardiovascular system: S1 & S2 heard, RRR.  Gastrointestinal system: Abdomen is nondistended, soft and nontender.  Central nervous system: Alert and oriented. No focal neurological  deficits. Extremities: no LEE Skin: No rashes, lesions or ulcers Psychiatry: Judgement and insight appear normal. Mood & affect appropriate.     Data Reviewed: I have personally reviewed following labs and imaging studies  CBC: Recent Labs  Lab 06/01/19 1219 06/01/19 1232 06/02/19 0351 06/03/19 0251 06/04/19 0145  WBC 10.3  --  13.0* 17.8* 11.6*  NEUTROABS 7.5  --   --   --  8.6*  HGB 14.9 16.7 13.7 14.1 13.1  HCT 44.0 49.0 40.1 40.9 38.7*  MCV 90.3  --  89.1 89.1 90.0  PLT 339  --  312 314 856    Basic Metabolic Panel: Recent Labs  Lab 06/01/19 1219 06/01/19 1232 06/02/19 0351 06/03/19 0251 06/04/19 0145  NA 130* 131* 127* 126* 127*  K 3.6 3.7 4.7 4.5 4.3  CL 95* 94* 90* 91* 94*  CO2 21*  --  25 25 24   GLUCOSE 173* 182* 138* 107* 98  BUN 6* 7* 9 11 14   CREATININE 0.71 0.80 0.81 0.71 0.75  CALCIUM 9.3  --  10.1 9.6 9.3  MG  --   --   --  2.1  --     GFR: Estimated Creatinine Clearance: 92.1 mL/min (by C-G formula based on SCr of 0.75 mg/dL).  Liver Function Tests: Recent Labs  Lab 06/01/19 1219 06/03/19 0251  AST 71* 34  ALT 39 25  ALKPHOS 72 57  BILITOT 0.9 0.9  PROT 9.1* 7.9  ALBUMIN 4.4 3.8    CBG: No results for input(s): GLUCAP in the last 168 hours.   Recent Results (from the past 240 hour(s))  SARS Coronavirus 2 by RT PCR (hospital order, performed in [facility] hospital lab) Nasopharyngeal Nasopharyngeal Swab     Status: None   Collection Time: 06/01/19  4:32 PM   Specimen: Nasopharyngeal Swab  Result Value Ref Range Status   SARS Coronavirus 2 NEGATIVE NEGATIVE Final    Comment: (NOTE) SARS-CoV-2 target nucleic acids are NOT DETECTED. The SARS-CoV-2 RNA is generally detectable in upper and lower respiratory specimens during the acute phase of infection. The lowest concentration of SARS-CoV-2 viral copies this assay can detect is 250 copies / mL. Terry Page negative result does not preclude SARS-CoV-2 infection and should not be used as the  sole basis for treatment or other patient management decisions.  Terry Page negative result may occur with improper specimen collection / handling, submission of specimen other than nasopharyngeal swab, presence of viral mutation(s) within the areas targeted by this assay, and inadequate number of viral copies (<250 copies / mL). Melda Mermelstein negative result must be combined with clinical observations, patient history, and epidemiological information. Fact Sheet for Patients:   StrictlyIdeas.no Fact Sheet for Healthcare Providers: BankingDealers.co.za This test is not yet approved or cleared  by the [place] FDA and has been authorized for detection and/or diagnosis of SARS-CoV-2 by FDA under an Emergency Use Authorization (EUA).  This EUA will remain in effect (meaning this test can be used) for the duration of the COVID-19 declaration under Section 564(b)(1) of the Act, 21 U.S.C. section 360bbb-3(b)(1), unless the authorization is terminated or revoked sooner. Performed at [facility], [place] [address], [place], [place] [address]          Radiology Studies: DG CHEST PORT 1 VIEW  Result Date: 06/04/2019 CLINICAL DATA:  Chest tube present. EXAM: PORTABLE CHEST 1 VIEW COMPARISON:  June 03, 2019. FINDINGS: The heart size and mediastinal contours are within normal limits. Stable position of right-sided chest tube without definite pneumothorax. Stable scarring is seen in left midlung and lingular region. Atherosclerosis of thoracic aorta is noted. No pleural effusion is noted. The visualized skeletal structures are unremarkable. IMPRESSION: Stable position of right-sided chest tube without definite pneumothorax. Stable scarring seen in left midlung and lingular region. Aortic Atherosclerosis (ICD10-I70.0). Electronically Signed   By: Marijo Conception M.D.   On: 06/04/2019 12:05   DG Chest Port 1 View  Result Date: 06/03/2019 CLINICAL DATA:  Right  pneumothorax, right chest, cough EXAM: PORTABLE CHEST 1 VIEW COMPARISON:  06/01/2019 FINDINGS: Stable position of the right pigtail chest tube extending to the apex. No significant residual pneumothorax by plain radiography. Similar right chest wall subcutaneous emphysema evident. Stable background bullous emphysema and left mid lung and lingula scarring. No new collapse or consolidation. Negative for edema or effusion. Stable heart size and vascularity. Aorta atherosclerotic. Degenerative changes of the spine. IMPRESSION: Stable right chest tube. No significant pneumothorax by plain radiography. Similar chronic changes of the chest as above. Electronically Signed   By: Jerilynn Mages.  Shick M.D.   On: 06/03/2019 11:39        Scheduled Meds: . amLODipine  10 mg Oral Daily  . atorvastatin  20 mg Oral Daily  . docusate sodium  100 mg Oral BID  . enoxaparin (LOVENOX) injection  40 mg Subcutaneous Q24H  . folic acid  1 mg Oral Daily  . irbesartan  300 mg Oral Daily  . multivitamin with minerals  1 tablet Oral Daily  . nicotine  14 mg Transdermal Daily  . sodium chloride flush  3 mL Intravenous Q12H  . sodium chloride  1 g Oral TID WC  . thiamine  100 mg Oral Daily   Or  . thiamine  100 mg Intravenous Daily   Continuous Infusions:   LOS: 3 days    Time spent: over 30 min    Fayrene Helper, MD [facility]   To contact the attending provider between 7A-7P or the covering provider during after hours 7P-7A, please log into the web site [email or web address] and access using universal [place] password for that web site. If you do not have the password, please call the hospital operator.  06/04/2019, 12:16 PM

## 2019-06-04 NOTE — Progress Notes (Addendum)
      [address]       [place] [address]             [phone]         Subjective: Breathing is pretty comfortable on supplemental [place] O2  Objective: Vital signs in last 24 hours: Temp:  [97.5 F (36.4 C)-98 F (36.7 C)] 97.5 F (36.4 C) (06/02 0847) Pulse Rate:  [89-102] 94 (06/02 0847) Cardiac Rhythm: Sinus tachycardia (06/01 1900) Resp:  [19-20] 20 (06/01 2215) BP: (113-155)/(88-99) 113/88 (06/02 0847) SpO2:  [98 %-100 %] 98 % (06/02 0847)  Hemodynamic parameters for last 24 hours:    Intake/Output from previous day: 06/01 0701 - 06/02 0700 In: 833.2 [P.O.:240; I.V.:593.2] Out: 750 [Urine:750] Intake/Output this shift: Total I/O In: 118 [P.O.:118] Out: 350 [Urine:350]  General appearance: alert, cooperative and no distress Heart: regular rate and rhythm Lungs: coarse  BS  Lab Results: Recent Labs    06/03/19 0251 06/04/19 0145  WBC 17.8* 11.6*  HGB 14.1 13.1  HCT 40.9 38.7*  PLT 314 288   BMET:  Recent Labs    06/03/19 0251 06/04/19 0145  NA 126* 127*  K 4.5 4.3  CL 91* 94*  CO2 25 24  GLUCOSE 107* 98  BUN 11 14  CREATININE 0.71 0.75  CALCIUM 9.6 9.3    PT/INR: No results for input(s): LABPROT, INR in the last 72 hours. ABG    Component Value Date/Time   TCO2 26 06/01/2019 1232   CBG (last 3)  No results for input(s): GLUCAP in the last 72 hours.  Meds Scheduled Meds: . amLODipine  10 mg Oral Daily  . atorvastatin  20 mg Oral Daily  . docusate sodium  100 mg Oral BID  . enoxaparin (LOVENOX) injection  40 mg Subcutaneous Q24H  . folic acid  1 mg Oral Daily  . irbesartan  300 mg Oral Daily  . multivitamin with minerals  1 tablet Oral Daily  . nicotine  14 mg Transdermal Daily  . sodium chloride flush  3 mL Intravenous Q12H  . sodium chloride  1 g Oral TID WC  . thiamine  100 mg Oral Daily   Or  . thiamine  100 mg Intravenous Daily   Continuous Infusions: PRN Meds:.acetaminophen **OR** acetaminophen,  bisacodyl, hydrALAZINE, HYDROcodone-acetaminophen, levalbuterol, LORazepam **OR** LORazepam, morphine injection, ondansetron **OR** ondansetron (ZOFRAN) IV, polyethylene glycol  Xrays DG Chest Port 1 View  Result Date: 06/03/2019 CLINICAL DATA:  Right pneumothorax, right chest, cough EXAM: PORTABLE CHEST 1 VIEW COMPARISON:  06/01/2019 FINDINGS: Stable position of the right pigtail chest tube extending to the apex. No significant residual pneumothorax by plain radiography. Similar right chest wall subcutaneous emphysema evident. Stable background bullous emphysema and left mid lung and lingula scarring. No new collapse or consolidation. Negative for edema or effusion. Stable heart size and vascularity. Aorta atherosclerotic. Degenerative changes of the spine. IMPRESSION: Stable right chest tube. No significant pneumothorax by plain radiography. Similar chronic changes of the chest as above. Electronically Signed   By: Jerilynn Mages.  Shick M.D.   On: 06/03/2019 11:39    Assessment/Plan:  1 stable with CT in place, no air leak noted with cough, CXR is stable in appearance. Cont H2O seal, poss d/c tube soon   S LOS: 3 days    John Giovanni PA-C Pager [phone] 06/04/2019   Agree with above Chest x-ray stable We will remove chest tube tomorrow. Continue pulmonary toilet  Matilynn Dacey O Mary Hockey

## 2019-06-04 NOTE — Plan of Care (Signed)

## 2019-06-05 ENCOUNTER — Inpatient Hospital Stay (HOSPITAL_COMMUNITY): Payer: Medicare HMO

## 2019-06-05 LAB — BASIC METABOLIC PANEL
Anion gap: 7 (ref 5–15)
BUN: 20 mg/dL (ref 8–23)
CO2: 24 mmol/L (ref 22–32)
Calcium: 8.9 mg/dL (ref 8.9–10.3)
Chloride: 98 mmol/L (ref 98–111)
Creatinine, Ser: 0.79 mg/dL (ref 0.61–1.24)
GFR calc Af Amer: >60 mL/min (ref >60)
GFR calc non Af Amer: >60 mL/min (ref >60)
Glucose, Bld: 107 mg/dL — ABNORMAL HIGH (ref 70–99)
Potassium: 4.2 mmol/L (ref 3.5–5.1)
Sodium: 129 mmol/L — ABNORMAL LOW (ref 135–145)

## 2019-06-05 LAB — CBC WITH DIFFERENTIAL/PLATELET
Abs Immature Granulocytes: 0.06 10*3/uL (ref 0.00–0.07)
Basophils Absolute: 0.1 10*3/uL (ref 0.0–0.1)
Basophils Relative: 1 %
Eosinophils Absolute: 0.3 10*3/uL (ref 0.0–0.5)
Eosinophils Relative: 2 %
HCT: 38.6 % — ABNORMAL LOW (ref 39.0–52.0)
Hemoglobin: 12.9 g/dL — ABNORMAL LOW (ref 13.0–17.0)
Immature Granulocytes: 1 %
Lymphocytes Relative: 12 %
Lymphs Abs: 1.4 10*3/uL (ref 0.7–4.0)
MCH: 30.6 pg (ref 26.0–34.0)
MCHC: 33.4 g/dL (ref 30.0–36.0)
MCV: 91.5 fL (ref 80.0–100.0)
Monocytes Absolute: 1.1 10*3/uL — ABNORMAL HIGH (ref 0.1–1.0)
Monocytes Relative: 9 %
Neutro Abs: 9.3 10*3/uL — ABNORMAL HIGH (ref 1.7–7.7)
Neutrophils Relative %: 75 %
Platelets: 274 10*3/uL (ref 150–400)
RBC: 4.22 MIL/uL (ref 4.22–5.81)
RDW: 14.3 % (ref 11.5–15.5)
WBC: 12.3 10*3/uL — ABNORMAL HIGH (ref 4.0–10.5)
nRBC: 0 % (ref 0.0–0.2)

## 2019-06-05 LAB — MAGNESIUM: Magnesium: 1.9 mg/dL (ref 1.7–2.4)

## 2019-06-05 LAB — PHOSPHORUS: Phosphorus: 4.4 mg/dL (ref 2.5–4.6)

## 2019-06-05 NOTE — Progress Notes (Addendum)
      [address]       [place] [address]             [phone]      Vitals:   06/04/19 2300 06/05/19 0737  BP: 105/77 114/73  Pulse: 96 85  Resp:  18  Temp:  97.9 F (36.6 C)  SpO2: 98% 99%   DG Chest 2 View  Result Date: 06/05/2019 CLINICAL DATA:  Chest tube. EXAM: CHEST - 2 VIEW COMPARISON:  June 04, 2019. FINDINGS: The heart size and mediastinal contours are within normal limits. Stable position of right-sided chest tube. No definite pneumothorax or pleural effusion is noted. Probable emphysematous disease is noted in both upper lobes. Stable scarring is seen in left midlung and lingular region. The visualized skeletal structures are unremarkable. IMPRESSION: Stable position of right-sided chest tube. No definite pneumothorax is noted. Aortic Atherosclerosis (ICD10-I70.0) and Emphysema (ICD10-J43.9). Electronically Signed   By: Marijo Conception M.D.   On: 06/05/2019 13:03    Brief procedure  Chest tube removed per routine technique.  Patient tolerated well Will repeat CXR in am - will be stable from surgical perspective if no recurrence of pneumothorax   John Giovanni, PA-C   CXR stable Please call with questions Lajuana Matte

## 2019-06-05 NOTE — Progress Notes (Signed)
PROGRESS NOTE    Terry Page.  GYF:[id number] DOB: 05-Jan-1955 DOA: 06/01/2019 PCP: [name], MD   No chief complaint on file.  Brief Narrative:  64 y.o.malewith medical history significant ofprostate CA; COPD; spontaneous PTX (2019); LUL lung nodule treated with XRT (04/2018); and ETOH dependence presenting with SOB. He started smoking again in the last 2 years. He had Gilmer Kaminsky cigarette this AM and shortly thereafter felt SOB. It was similar to his presentation 2 years ago.  He was found to have Adekunle Rohrbach pneumothorax.  Chest tube was placed.  Assessment & Plan:   Principal Problem:   Recurrent spontaneous pneumothorax Active Problems:   Tobacco abuse   Alcohol abuse   Bullous emphysema ([name])   Malignant neoplasm of prostate ([place])  Recurrent spontaneous pneumothorax/acute respiratory failure with hypoxia Similar history in 2019.  Patient had Rosene Pilling chest tube placed in the emergency department.   Stable R sided chest tube, no definite pneumothorax 6/3 Chest tube removed 6/3 per CT surgery Plan for CXR 6/4 Cardiothoracic surgery is following.  Currently on 2 to 3 L of oxygen.  Stable.   Will need home O2 screen prior to discharge Chest tube per CT surgery  History of COPD/tobacco abuse Does not appear to be on any maintenance medications.  He was noted to be wheezing yesterday.  Started on Xopenex nebulizers with improvement.  Continue to monitor.    Hyponatremia Repeat urine studies (urine sodium >100, urine osm 670), he appears relatively euvolemic  Appears c/w SIADH Continue salt tabs and fluid restriction, improving Will continue to monitor   Leukocytosis Improved, continue to monitor UA negative  Nonsustained VT 7 beat run of VT noted on telemetry.  Patient was asymptomatic.  Potassium was normal.  Patient denies any chest pain.  Check magnesium level.  Continue to monitor on telemetry.  Repeat EKG from 6/1 with NSR.  Left upper lobe lung nodule Previously  treated with radiation.  Apparently there is some concern for malignant process due to recurrence increase in size recently.  Outpatient management.  History of prostate cancer Apparently recently diagnosed.  Outpatient management.  Essential hypertension Patient on amlodipine and Avapro.  Appropriate today, continue to monitor.  Hyperlipidemia Continue Lipitor.  History of alcohol abuse Supposedly drinks several drinks per night.  He is at risk for withdrawal symptoms.  Continue CIWA.  Continue to monitor.  So far no signs of withdrawal.  DVT prophylaxis: lovenox Code Status:full  Family Communication: none at bedside - wife on phone 6/3 Disposition:   Status is: Inpatient  Remains inpatient appropriate because:Inpatient level of care appropriate due to severity of illness   Dispo: The patient is from: Home              Anticipated d/c is to: Home              Anticipated d/c date is: > 3 days              Patient currently is not medically stable to d/c.   Consultants:   CT surgery  Procedures:   Chest tube insertion 5/30  Antimicrobials:  Anti-infectives (From admission, onward)   None      Subjective: No new complaints  Objective: Vitals:   06/04/19 1500 06/04/19 2259 06/04/19 2300 06/05/19 0737  BP: 112/75 99/78 105/77 114/73  Pulse: 91 96 96 85  Resp: 14 18  18   Temp: 98.2 F (36.8 C) 98.8 F (37.1 C)  97.9 F (36.6 C)  TempSrc:  Oral     SpO2: 95% 98% 98% 99%  Weight:      Height:        Intake/Output Summary (Last 24 hours) at 06/05/2019 1427 Last data filed at 06/04/2019 2030 Gross per 24 hour  Intake 240 ml  Output 250 ml  Net -10 ml   Filed Weights   06/02/19 0855  Weight: 73.5 kg    Examination:  General: No acute distress. Cardiovascular: Heart sounds show Samiyah Stupka regular rate, and rhythm. Lungs: Clear to auscultation bilaterally . Abdomen: Soft, nontender, nondistended  Neurological: Alert and oriented 3. Moves all extremities 4  . Cranial nerves II through XII grossly intact. Skin: Warm and dry. No rashes or lesions. Extremities: No clubbing or cyanosis. No edema.   Data Reviewed: I have personally reviewed following labs and imaging studies  CBC: Recent Labs  Lab 06/01/19 1219 06/01/19 1219 06/01/19 1232 06/02/19 0351 06/03/19 0251 06/04/19 0145 06/05/19 0307  WBC 10.3  --   --  13.0* 17.8* 11.6* 12.3*  NEUTROABS 7.5  --   --   --   --  8.6* 9.3*  HGB 14.9   < > 16.7 13.7 14.1 13.1 12.9*  HCT 44.0   < > 49.0 40.1 40.9 38.7* 38.6*  MCV 90.3  --   --  89.1 89.1 90.0 91.5  PLT 339  --   --  312 314 288 274   < > = values in this interval not displayed.    Basic Metabolic Panel: Recent Labs  Lab 06/01/19 1219 06/01/19 1219 06/01/19 1232 06/02/19 0351 06/03/19 0251 06/04/19 0145 06/05/19 0307  NA 130*   < > 131* 127* 126* 127* 129*  K 3.6   < > 3.7 4.7 4.5 4.3 4.2  CL 95*   < > 94* 90* 91* 94* 98  CO2 21*  --   --  25 25 24 24   GLUCOSE 173*   < > 182* 138* 107* 98 107*  BUN 6*   < > 7* 9 11 14 20   CREATININE 0.71   < > 0.80 0.81 0.71 0.75 0.79  CALCIUM 9.3  --   --  10.1 9.6 9.3 8.9  MG  --   --   --   --  2.1  --  1.9  PHOS  --   --   --   --   --   --  4.4   < > = values in this interval not displayed.    GFR: Estimated Creatinine Clearance: 92.1 mL/min (by C-G formula based on SCr of 0.79 mg/dL).  Liver Function Tests: Recent Labs  Lab 06/01/19 1219 06/03/19 0251  AST 71* 34  ALT 39 25  ALKPHOS 72 57  BILITOT 0.9 0.9  PROT 9.1* 7.9  ALBUMIN 4.4 3.8    CBG: No results for input(s): GLUCAP in the last 168 hours.   Recent Results (from the past 240 hour(s))  SARS Coronavirus 2 by RT PCR (hospital order, performed in [facility] hospital lab) Nasopharyngeal Nasopharyngeal Swab     Status: None   Collection Time: 06/01/19  4:32 PM   Specimen: Nasopharyngeal Swab  Result Value Ref Range Status   SARS Coronavirus 2 NEGATIVE NEGATIVE Final    Comment: (NOTE) SARS-CoV-2 target  nucleic acids are NOT DETECTED. The SARS-CoV-2 RNA is generally detectable in upper and lower respiratory specimens during the acute phase of infection. The lowest concentration of SARS-CoV-2 viral copies this assay can detect is 250 copies /  mL. Sedale Jenifer negative result does not preclude SARS-CoV-2 infection and should not be used as the sole basis for treatment or other patient management decisions.  Andreia Gandolfi negative result may occur with improper specimen collection / handling, submission of specimen other than nasopharyngeal swab, presence of viral mutation(s) within the areas targeted by this assay, and inadequate number of viral copies (<250 copies / mL). Ralphine Hinks negative result must be combined with clinical observations, patient history, and epidemiological information. Fact Sheet for Patients:   StrictlyIdeas.no Fact Sheet for Healthcare Providers: BankingDealers.co.za This test is not yet approved or cleared  by the [place] FDA and has been authorized for detection and/or diagnosis of SARS-CoV-2 by FDA under an Emergency Use Authorization (EUA).  This EUA will remain in effect (meaning this test can be used) for the duration of the COVID-19 declaration under Section 564(b)(1) of the Act, 21 U.S.C. section 360bbb-3(b)(1), unless the authorization is terminated or revoked sooner. Performed at [facility], [place] [address], [place],  [address]          Radiology Studies: DG Chest 2 View  Result Date: 06/05/2019 CLINICAL DATA:  Chest tube. EXAM: CHEST - 2 VIEW COMPARISON:  June 04, 2019. FINDINGS: The heart size and mediastinal contours are within normal limits. Stable position of right-sided chest tube. No definite pneumothorax or pleural effusion is noted. Probable emphysematous disease is noted in both upper lobes. Stable scarring is seen in left midlung and lingular region. The visualized skeletal structures are unremarkable.  IMPRESSION: Stable position of right-sided chest tube. No definite pneumothorax is noted. Aortic Atherosclerosis (ICD10-I70.0) and Emphysema (ICD10-J43.9). Electronically Signed   By: Marijo Conception M.D.   On: 06/05/2019 13:03   DG CHEST PORT 1 VIEW  Result Date: 06/04/2019 CLINICAL DATA:  Chest tube present. EXAM: PORTABLE CHEST 1 VIEW COMPARISON:  June 03, 2019. FINDINGS: The heart size and mediastinal contours are within normal limits. Stable position of right-sided chest tube without definite pneumothorax. Stable scarring is seen in left midlung and lingular region. Atherosclerosis of thoracic aorta is noted. No pleural effusion is noted. The visualized skeletal structures are unremarkable. IMPRESSION: Stable position of right-sided chest tube without definite pneumothorax. Stable scarring seen in left midlung and lingular region. Aortic Atherosclerosis (ICD10-I70.0). Electronically Signed   By: Marijo Conception M.D.   On: 06/04/2019 12:05        Scheduled Meds: . amLODipine  10 mg Oral Daily  . atorvastatin  20 mg Oral Daily  . docusate sodium  100 mg Oral BID  . enoxaparin (LOVENOX) injection  40 mg Subcutaneous Q24H  . folic acid  1 mg Oral Daily  . irbesartan  300 mg Oral Daily  . multivitamin with minerals  1 tablet Oral Daily  . nicotine  14 mg Transdermal Daily  . sodium chloride flush  3 mL Intravenous Q12H  . sodium chloride  1 g Oral TID WC  . thiamine  100 mg Oral Daily   Or  . thiamine  100 mg Intravenous Daily   Continuous Infusions:   LOS: 4 days    Time spent: over 30 min    Fayrene Helper, MD [facility]   To contact the attending provider between 7A-7P or the covering provider during after hours 7P-7A, please log into the web site [email or web address] and access using universal [place] password for that web site. If you do not have the password, please call the hospital operator.  06/05/2019, 2:27 PM

## 2019-06-05 NOTE — Progress Notes (Addendum)
      [address]       [place] [address]             [phone]         Subjective: Feels okay this morning. Patient is confused why he has not been down for a chest xray.   Objective: Vital signs in last 24 hours: Temp:  [97.9 F (36.6 C)-98.8 F (37.1 C)] 97.9 F (36.6 C) (06/03 0737) Pulse Rate:  [85-96] 85 (06/03 0737) Cardiac Rhythm: Normal sinus rhythm (06/03 0701) Resp:  [14-18] 18 (06/03 0737) BP: (99-114)/(73-78) 114/73 (06/03 0737) SpO2:  [95 %-99 %] 99 % (06/03 0737)     Intake/Output from previous day: 06/02 0701 - 06/03 0700 In: 358 [P.O.:358] Out: 600 [Urine:350] Intake/Output this shift: No intake/output data recorded.  General appearance: alert, cooperative and no distress Heart: regular rate and rhythm, S1, S2 normal, no murmur, click, rub or gallop Lungs: clear to auscultation bilaterally Abdomen: soft, non-tender; bowel sounds normal; no masses,  no organomegaly Extremities: extremities normal, atraumatic, no cyanosis or edema Wound: clean and dry around the chest tube  Lab Results: Recent Labs    06/04/19 0145 06/05/19 0307  WBC 11.6* 12.3*  HGB 13.1 12.9*  HCT 38.7* 38.6*  PLT 288 274   BMET:  Recent Labs    06/04/19 0145 06/05/19 0307  NA 127* 129*  K 4.3 4.2  CL 94* 98  CO2 24 24  GLUCOSE 98 107*  BUN 14 20  CREATININE 0.75 0.79  CALCIUM 9.3 8.9    PT/INR: No results for input(s): LABPROT, INR in the last 72 hours. ABG    Component Value Date/Time   TCO2 26 06/01/2019 1232   CBG (last 3)  No results for input(s): GLUCAP in the last 72 hours.  Assessment/Plan:  1. Chest tube in place without air leak. Not much drainage per nursing. Will order CXR for this morning since not done yet. Tolerating 3L [place] with good oxygen support.  2. Renal function-creatinine 0.79, electrolytes okay 3. CV-BP stable, NSR in the 80s 4. H and H stable   Plan: Will follow-up on CXR. If stable will remove remaining chest  tube and get a follow-up CXR in the morning.     CXR 06/05/2019:   CLINICAL DATA:  Chest tube.  EXAM: CHEST - 2 VIEW  COMPARISON:  June 04, 2019.  FINDINGS: The heart size and mediastinal contours are within normal limits. Stable position of right-sided chest tube. No definite pneumothorax or pleural effusion is noted. Probable emphysematous disease is noted in both upper lobes. Stable scarring is seen in left midlung and lingular region. The visualized skeletal structures are unremarkable.  IMPRESSION: Stable position of right-sided chest tube. No definite pneumothorax is noted.  Aortic Atherosclerosis (ICD10-I70.0) and Emphysema (ICD10-J43.9).   Electronically Signed   By: Marijo Conception M.D.   On: 06/05/2019 13:03   LOS: 4 days    Terry Page 06/05/2019

## 2019-06-06 ENCOUNTER — Inpatient Hospital Stay (HOSPITAL_COMMUNITY): Payer: Medicare HMO

## 2019-06-06 LAB — CBC WITH DIFFERENTIAL/PLATELET
Abs Immature Granulocytes: 0.07 10*3/uL (ref 0.00–0.07)
Basophils Absolute: 0 10*3/uL (ref 0.0–0.1)
Basophils Relative: 0 %
Eosinophils Absolute: 0.3 10*3/uL (ref 0.0–0.5)
Eosinophils Relative: 2 %
HCT: 39.1 % (ref 39.0–52.0)
Hemoglobin: 12.8 g/dL — ABNORMAL LOW (ref 13.0–17.0)
Immature Granulocytes: 1 %
Lymphocytes Relative: 11 %
Lymphs Abs: 1.3 10*3/uL (ref 0.7–4.0)
MCH: 30.3 pg (ref 26.0–34.0)
MCHC: 32.7 g/dL (ref 30.0–36.0)
MCV: 92.7 fL (ref 80.0–100.0)
Monocytes Absolute: 1.2 10*3/uL — ABNORMAL HIGH (ref 0.1–1.0)
Monocytes Relative: 10 %
Neutro Abs: 8.8 10*3/uL — ABNORMAL HIGH (ref 1.7–7.7)
Neutrophils Relative %: 76 %
Platelets: 295 10*3/uL (ref 150–400)
RBC: 4.22 MIL/uL (ref 4.22–5.81)
RDW: 14.4 % (ref 11.5–15.5)
WBC: 11.6 10*3/uL — ABNORMAL HIGH (ref 4.0–10.5)
nRBC: 0 % (ref 0.0–0.2)

## 2019-06-06 LAB — BASIC METABOLIC PANEL
Anion gap: 9 (ref 5–15)
BUN: 20 mg/dL (ref 8–23)
CO2: 26 mmol/L (ref 22–32)
Calcium: 9.4 mg/dL (ref 8.9–10.3)
Chloride: 97 mmol/L — ABNORMAL LOW (ref 98–111)
Creatinine, Ser: 0.78 mg/dL (ref 0.61–1.24)
GFR calc Af Amer: >60 mL/min (ref >60)
GFR calc non Af Amer: >60 mL/min (ref >60)
Glucose, Bld: 105 mg/dL — ABNORMAL HIGH (ref 70–99)
Potassium: 4 mmol/L (ref 3.5–5.1)
Sodium: 132 mmol/L — ABNORMAL LOW (ref 135–145)

## 2019-06-06 LAB — PHOSPHORUS: Phosphorus: 4.3 mg/dL (ref 2.5–4.6)

## 2019-06-06 LAB — MAGNESIUM: Magnesium: 2 mg/dL (ref 1.7–2.4)

## 2019-06-06 MED ORDER — ALBUTEROL SULFATE HFA 108 (90 BASE) MCG/ACT IN AERS
2.0000 | INHALATION_SPRAY | Freq: Four times a day (QID) | RESPIRATORY_TRACT | 1 refills | Status: DC | PRN
Start: 2019-06-06 — End: 2023-02-09

## 2019-06-06 MED ORDER — FOLIC ACID 1 MG PO TABS: 1.0000 mg | ORAL_TABLET | Freq: Every day | ORAL | 0 refills | Status: AC

## 2019-06-06 MED ORDER — ADULT MULTIVITAMIN W/MINERALS CH: 1.0000 | ORAL_TABLET | Freq: Every day | ORAL | 0 refills | Status: AC

## 2019-06-06 MED ORDER — THIAMINE HCL 100 MG PO TABS: 100.0000 mg | ORAL_TABLET | Freq: Every day | ORAL | 0 refills | Status: AC

## 2019-06-06 MED ORDER — SODIUM CHLORIDE 1 G PO TABS: 1.0000 g | ORAL_TABLET | Freq: Three times a day (TID) | ORAL | 0 refills | Status: AC

## 2019-06-06 NOTE — Discharge Summary (Signed)
Physician Discharge Summary  Terry Page. IFO:[id number] DOB: [birth year] DOA: 06/01/2019  PCP: [name], MD  Admit date: 06/01/2019 Discharge date: 06/06/2019  Time spent: 40 minutes  Recommendations for Outpatient Follow-up:  1. Follow outpatient CBC/CMP 2. Follow hyponatremia outpatient with salt tabs/fluid restriction 3. Enlarging LUL nodule will need follow up, appears plan for repeat imaging with Dr. Sondra Come, pt instructed to follow up outpatient 4. Follow up with CT surgery outpatient  Discharge Diagnoses:  Principal Problem:   Recurrent spontaneous pneumothorax Active Problems:   Tobacco abuse   Alcohol abuse   Bullous emphysema ([name])   Malignant neoplasm of prostate [facility])   Discharge Condition: stable  Diet recommendation: heart healthy  Filed Weights   06/02/19 0855  Weight: 73.5 kg    History of present illness:  64 y.o.malewith medical history significant ofprostate CA; COPD; spontaneous PTX (2019); LUL lung nodule treated with XRT (04/2018); and ETOH dependence presenting with SOB. He started smoking again in the last 2 years. He had Avel Ogawa cigarette this AM and shortly thereafter felt SOB. It was similar to his presentation 2 years ago. He was found to have Parminder Cupples pneumothorax. Chest tube was placed and was managed by CT surgery.  He's had resolution of his pneumothorax and his chest tube has been removed.  CXR stable on 6/4 and pt discharged with plan for outpatient follow up.  See below for additional details  Hospital Course:  Recurrent spontaneous pneumothorax/acute respiratory failure with hypoxia Similar history in 2019. Patient had Meerab Maselli chest tube placed in the emergency department.  Stable R sided chest tube, no definite pneumothorax 6/3 Chest tube removed 6/3 per CT surgery CXR 6/4 with no pneumothorax after chest tube removal Weaned to RA, follow outpatient   History of COPD/tobacco abuse Does not appear to be on any maintenance  medications.Prescribed albuterol at discharge.  Hyponatremia Repeat urine studies (urine sodium >100, urine osm 670), he appears relatively euvolemic  Appears c/w SIADH Continue salt tabs and fluid restriction, improving -> follow outpatient Will continue to monitor   Leukocytosis Improved, continue to monitor UA negative  Nonsustained VT 7 beat run ofVT noted ontelemetry. Patient was asymptomatic. Potassium was normal. Patient denies any chest pain. Check magnesium level. Continue to monitor on telemetry. Repeat EKG from 6/1 with NSR. Follow outpatient  Left upper lobe lung nodule Previously treated with radiation. Apparently there is some concern for malignant process due to recurrence increase in size recently. Outpatient management. Looks like plan for f/u with CT scan with Dr. Sondra Come, encouraged pt to follow up as planned  History of prostate cancer Apparently recently diagnosed. Outpatient management.  Essential hypertension Patient on amlodipine and Avapro.  Appropriate today, continue to monitor.  Hyperlipidemia Continue Lipitor.  History of alcohol abuse Supposedly drinks several drinks per night. He is at risk for withdrawal symptoms. Continue CIWA. Continue to monitor. So far no signs of withdrawal. Encouraged cessation  Procedures: Chest tube insertion 5/30, removal 6/3  Consultations:  CT surgery  Discharge Exam: Vitals:   06/05/19 1647 06/06/19 0804  BP: 125/83 (!) 130/115  Pulse: 91 82  Resp: 18 19  Temp: 97.7 F (36.5 C)   SpO2: 99% 100%   Feels well, eager to go home Discussed d/c plan and recs  General: No acute distress. Cardiovascular: Heart sounds show Mckinzey Entwistle regular rate, and rhythm Lungs: Clear to auscultation bilaterally  Abdomen: Soft, nontender, nondistended  Neurological: Alert and oriented 3. Moves all extremities 4 . Cranial nerves II through XII  grossly intact. Skin: Warm and dry. No rashes or  lesions. Extremities: No clubbing or cyanosis. No edema.   Discharge Instructions   Discharge Instructions    Call MD for:  difficulty breathing, headache or visual disturbances   Complete by: As directed    Call MD for:  extreme fatigue   Complete by: As directed    Call MD for:  hives   Complete by: As directed    Call MD for:  persistant dizziness or light-headedness   Complete by: As directed    Call MD for:  persistant nausea and vomiting   Complete by: As directed    Call MD for:  redness, tenderness, or signs of infection (pain, swelling, redness, odor or green/yellow discharge around incision site)   Complete by: As directed    Call MD for:  severe uncontrolled pain   Complete by: As directed    Call MD for:  temperature >100.4   Complete by: As directed    Diet - low sodium heart healthy   Complete by: As directed    1200 ml fluid restriction   Discharge instructions   Complete by: As directed    You were seen for Vincentina Sollers spontaneous pneumothorax.  This improved with Burnett Spray chest tube.  You were seen by CT surgery who will follow up with you as an outpatient.  Please follow up with CT surgery outpatient as scheduled.  You have low sodium levels.  This improved with salt tabs and Asani Deniston fluid restriction.  Please follow up with your PCP outpatient for repeat labs.  Continue your salt tabs as prescribed with Icie Kuznicki 1.2 liter (1200 ml) fluid restriction daily.  Follow up labs within 1 week with your PCP.  You need to follow up your lung nodule with your outpatient providers.  It's extremely important you follow up with them for Cedarius Kersh plan to manage this.  It looks like Dr. Sondra Come planned to repeat Demosthenes Virnig CT scan soon.  Please follow up with Dr. Sondra Come.  Return for new, recurrent, or worsening symptoms.  Please ask your PCP to request records from this hospitalization so they know what was done and what the next steps will be.   Increase activity slowly   Complete by: As directed      Allergies  as of 06/06/2019   No Known Allergies     Medication List    STOP taking these medications   naproxen sodium 220 MG tablet Commonly known as: ALEVE     TAKE these medications   albuterol 108 (90 Base) MCG/ACT inhaler Commonly known as: VENTOLIN HFA Inhale 2 puffs into the lungs every 6 (six) hours as needed for wheezing or shortness of breath.   amLODipine 10 MG tablet Commonly known as: NORVASC Take 1 tablet (10 mg total) by mouth daily.   aspirin EC 81 MG tablet Take 81 mg by mouth daily.   atorvastatin 20 MG tablet Commonly known as: LIPITOR Take 20 mg by mouth daily.   docusate sodium 100 MG capsule Commonly known as: COLACE Take 100 mg by mouth daily.   folic acid 1 MG tablet Commonly known as: FOLVITE Take 1 tablet (1 mg total) by mouth daily. Start taking on: June 07, 2019   multivitamin with minerals Tabs tablet Take 1 tablet by mouth daily. Start taking on: June 07, 2019   olmesartan 40 MG tablet Commonly known as: BENICAR Take 40 mg by mouth daily.   sodium chloride 1 g tablet Take 1 tablet (  1 g total) by mouth 3 (three) times daily with meals.   thiamine 100 MG tablet Take 1 tablet (100 mg total) by mouth daily. Start taking on: June 07, 2019      No Known Allergies Follow-up Information    Lightfoot, Lucile Crater, MD Follow up.   Specialty: Cardiothoracic Surgery Why: office will contact you to see Dr Kipp Brood.  If you have not heard from the office within the next few days please call them to arrange this appointment. Contact information: [place] [address] [phone]            The results of significant diagnostics from this hospitalization (including imaging, microbiology, ancillary and laboratory) are listed below for reference.    Significant Diagnostic Studies: DG Chest 2 View  Result Date: 06/05/2019 CLINICAL DATA:  Chest tube. EXAM: CHEST - 2 VIEW COMPARISON:  June 04, 2019. FINDINGS: The heart size  and mediastinal contours are within normal limits. Stable position of right-sided chest tube. No definite pneumothorax or pleural effusion is noted. Probable emphysematous disease is noted in both upper lobes. Stable scarring is seen in left midlung and lingular region. The visualized skeletal structures are unremarkable. IMPRESSION: Stable position of right-sided chest tube. No definite pneumothorax is noted. Aortic Atherosclerosis (ICD10-I70.0) and Emphysema (ICD10-J43.9). Electronically Signed   By: Marijo Conception M.D.   On: 06/05/2019 13:03   NM Bone Scan Whole Body  Result Date: 05/27/2019 CLINICAL DATA:  Prostate cancer. EXAM: NUCLEAR MEDICINE WHOLE BODY BONE SCAN TECHNIQUE: Whole body anterior and posterior images were obtained approximately 3 hours after intravenous injection of radiopharmaceutical. RADIOPHARMACEUTICALS:  21.1 mCi Technetium-92m MDP IV COMPARISON:  PET CT 12/21/2017. FINDINGS: Bilateral renal function excretion. Mild increased activity both hips, right side greater than left. Disc car spine degenerative change noted on recent PET-CT. Mild increased activity both shoulders, bilateral upper costochondral regions, and sternomanubrial region most likely degenerative. Degenerative changes noted this region on recent PET-CT. IMPRESSION: Findings consistent degenerative change as above. No evidence of metastatic disease. Electronically Signed   By: Marcello Moores  Register   On: 05/27/2019 06:19   DG Chest Port 1 View  Result Date: 06/06/2019 CLINICAL DATA:  64 year old male right side chest tube removal yesterday. Presented with right side spontaneous tension pneumothorax. Bullous emphysema. EXAM: PORTABLE CHEST 1 VIEW COMPARISON:  06/05/2019 chest radiographs and earlier. FINDINGS: Portable AP upright view at 0508 hours. Upper lobe bullous emphysema again noted. No pneumothorax identified. Stable small volume right chest wall subcutaneous gas. Stable lung parenchyma elsewhere, mild architectural  distortion. No pleural effusion. Stable cardiac size and mediastinal contours. Stable visualized osseous structures. IMPRESSION: No pneumothorax following chest tube removal. Stable bolus emphysema. Electronically Signed   By: Genevie Ann M.D.   On: 06/06/2019 08:13   DG CHEST PORT 1 VIEW  Result Date: 06/04/2019 CLINICAL DATA:  Chest tube present. EXAM: PORTABLE CHEST 1 VIEW COMPARISON:  June 03, 2019. FINDINGS: The heart size and mediastinal contours are within normal limits. Stable position of right-sided chest tube without definite pneumothorax. Stable scarring is seen in left midlung and lingular region. Atherosclerosis of thoracic aorta is noted. No pleural effusion is noted. The visualized skeletal structures are unremarkable. IMPRESSION: Stable position of right-sided chest tube without definite pneumothorax. Stable scarring seen in left midlung and lingular region. Aortic Atherosclerosis (ICD10-I70.0). Electronically Signed   By: Marijo Conception M.D.   On: 06/04/2019 12:05   DG Chest Port 1 View  Result Date: 06/03/2019  CLINICAL DATA:  Right pneumothorax, right chest, cough EXAM: PORTABLE CHEST 1 VIEW COMPARISON:  06/01/2019 FINDINGS: Stable position of the right pigtail chest tube extending to the apex. No significant residual pneumothorax by plain radiography. Similar right chest wall subcutaneous emphysema evident. Stable background bullous emphysema and left mid lung and lingula scarring. No new collapse or consolidation. Negative for edema or effusion. Stable heart size and vascularity. Aorta atherosclerotic. Degenerative changes of the spine. IMPRESSION: Stable right chest tube. No significant pneumothorax by plain radiography. Similar chronic changes of the chest as above. Electronically Signed   By: Jerilynn Mages.  Shick M.D.   On: 06/03/2019 11:39   DG Chest Port 1 View  Result Date: 06/01/2019 CLINICAL DATA:  Post chest tube placement Pt bib ems from home with sudden onset of sob approx 1 hour pta. Given  2 neb pta, 125mg  solu medrol. Hx copd, hx spontaneous pneumopigtail catheter placement EXAM: PORTABLE CHEST 1 VIEW COMPARISON:  Radiograph 06/01/2019 at 12:30 p.m. FINDINGS: Interval placement of Keylor Rands small bore RIGHT chest tube. Interval expansion the lung and resolution the pneumothorax. No RIGHT pneumothorax appreciated. Small amount subcutaneous gas along the RIGHT chest wall noted. Persistant atelectasis in the LEFT lung similar prior. Bullous change in the upper lobes. IMPRESSION: 1. Resolution of RIGHT pneumothorax following chest tube placement. 2. Bullous change in the upper lobes. 3. Small amount of gas along the RIGHT chest wall. Electronically Signed   By: Suzy Bouchard M.D.   On: 06/01/2019 13:43   DG Chest Port 1 View  Result Date: 06/01/2019 CLINICAL DATA:  Shortness of breath EXAM: PORTABLE CHEST 1 VIEW COMPARISON:  November 05, 2017 FINDINGS: There is Saahil Herbster sizable pneumothorax on the right with mild tension component. There are scattered areas of scarring throughout the left lung. No edema or airspace opacity. Heart size is within normal limits. No adenopathy. Pulmonary vascularity reflects Claxton Levitz degree of underlying emphysematous change. There is aortic atherosclerosis. No bone lesions. IMPRESSION: Sizable pneumothorax on the right with mild tension component. There is Tresha Muzio degree of underlying emphysematous change. Scarring noted in the left lung. No edema or airspace opacity. Heart size within normal limits. There is aortic atherosclerosis. Aortic Atherosclerosis (ICD10-I70.0) and Emphysema (ICD10-J43.9). Critical Value/emergent results were called by telephone at the time of interpretation on 06/01/2019 at 12:37 pm to provider [facility] , who verbally acknowledged these results. Electronically Signed   By: Lowella Grip III M.D.   On: 06/01/2019 12:38    Microbiology: Recent Results (from the past 240 hour(s))  SARS Coronavirus 2 by RT PCR (hospital order, performed in [facility] hospital  lab) Nasopharyngeal Nasopharyngeal Swab     Status: None   Collection Time: 06/01/19  4:32 PM   Specimen: Nasopharyngeal Swab  Result Value Ref Range Status   SARS Coronavirus 2 NEGATIVE NEGATIVE Final    Comment: (NOTE) SARS-CoV-2 target nucleic acids are NOT DETECTED. The SARS-CoV-2 RNA is generally detectable in upper and lower respiratory specimens during the acute phase of infection. The lowest concentration of SARS-CoV-2 viral copies this assay can detect is 250 copies / mL. Koray Soter negative result does not preclude SARS-CoV-2 infection and should not be used as the sole basis for treatment or other patient management decisions.  Mylie Mccurley negative result may occur with improper specimen collection / handling, submission of specimen other than nasopharyngeal swab, presence of viral mutation(s) within the areas targeted by this assay, and inadequate number of viral copies (<250 copies / mL). Amberley Hamler negative result must be combined with  clinical observations, patient history, and epidemiological information. Fact Sheet for Patients:   StrictlyIdeas.no Fact Sheet for Healthcare Providers: BankingDealers.co.za This test is not yet approved or cleared  by the [place] FDA and has been authorized for detection and/or diagnosis of SARS-CoV-2 by FDA under an Emergency Use Authorization (EUA).  This EUA will remain in effect (meaning this test can be used) for the duration of the COVID-19 declaration under Section 564(b)(1) of the Act, 21 U.S.C. section 360bbb-3(b)(1), unless the authorization is terminated or revoked sooner. Performed at [facility], [place] [address], [place], [place] [address]      Labs: Basic Metabolic Panel: Recent Labs  Lab 06/02/19 0351 06/03/19 0251 06/04/19 0145 06/05/19 0307 06/06/19 0315  NA 127* 126* 127* 129* 132*  K 4.7 4.5 4.3 4.2 4.0  CL 90* 91* 94* 98 97*  CO2 25 25 24 24 26   GLUCOSE 138* 107* 98 107*  105*  BUN 9 11 14 20 20   CREATININE 0.81 0.71 0.75 0.79 0.78  CALCIUM 10.1 9.6 9.3 8.9 9.4  MG  --  2.1  --  1.9 2.0  PHOS  --   --   --  4.4 4.3   Liver Function Tests: Recent Labs  Lab 06/01/19 1219 06/03/19 0251  AST 71* 34  ALT 39 25  ALKPHOS 72 57  BILITOT 0.9 0.9  PROT 9.1* 7.9  ALBUMIN 4.4 3.8   No results for input(s): LIPASE, AMYLASE in the last 168 hours. No results for input(s): AMMONIA in the last 168 hours. CBC: Recent Labs  Lab 06/01/19 1219 06/01/19 1232 06/02/19 0351 06/03/19 0251 06/04/19 0145 06/05/19 0307 06/06/19 0315  WBC 10.3   < > 13.0* 17.8* 11.6* 12.3* 11.6*  NEUTROABS 7.5  --   --   --  8.6* 9.3* 8.8*  HGB 14.9   < > 13.7 14.1 13.1 12.9* 12.8*  HCT 44.0   < > 40.1 40.9 38.7* 38.6* 39.1  MCV 90.3   < > 89.1 89.1 90.0 91.5 92.7  PLT 339   < > 312 314 288 274 295   < > = values in this interval not displayed.   Cardiac Enzymes: No results for input(s): CKTOTAL, CKMB, CKMBINDEX, TROPONINI in the last 168 hours. BNP: BNP (last 3 results) No results for input(s): BNP in the last 8760 hours.  ProBNP (last 3 results) No results for input(s): PROBNP in the last 8760 hours.  CBG: No results for input(s): GLUCAP in the last 168 hours.     Signed:  Fayrene Helper MD.  [facility] 06/06/2019, 6:47 PM

## 2019-06-06 NOTE — Progress Notes (Signed)
Physical Therapy Treatment and d/c Patient Details Name: Terry Page. MRN: [id number] DOB: Oct 15, 1955 Today's Date: 06/06/2019    History of Present Illness Pt is 64 yo male with PMH including prostate CA, COPD, L UL lung nodule treated with XRT 04/2018, and ETOH dependence.  Pt presented to ED with [facility] and found to have recurrent spontaneous pneumothorax and had chest tube placed on 06/01/19.  Chest tube removed 06/05/19.    PT Comments    Pt was able to transfer and ambulate safely.  He ambulated on RA with stable O2 sats.  Demonstrates good balance and safe to ambulate in room independently.  Pt near baseline and Pt has met goals and has no other PT needs.  Will sign off PT.    Follow Up Recommendations  Supervision - Intermittent;No PT follow up     Equipment Recommendations  None recommended by PT    Recommendations for Other Services       Precautions / Restrictions Precautions Precautions: None    Mobility  Bed Mobility Overal bed mobility: Needs Assistance Bed Mobility: Supine to Sit;Sit to Supine     Supine to sit: Independent Sit to supine: Independent      Transfers Overall transfer level: Needs assistance Equipment used: None Transfers: Sit to/from Stand Sit to Stand: Supervision            Ambulation/Gait Ambulation/Gait assistance: Supervision Gait Distance (Feet): 350 Feet Assistive device: None Gait Pattern/deviations: Step-through pattern     General Gait Details: no RW; steady gait; on RA sats 97% walking with DOE 1/4, 94% talking and walking with DOE 3/4.   Stairs             Wheelchair Mobility    Modified Rankin (Stroke Patients Only)       Balance Overall balance assessment: Independent   Sitting balance-Leahy Scale: Normal       Standing balance-Leahy Scale: Good                              Cognition Arousal/Alertness: Awake/alert Behavior During Therapy: WFL for tasks  assessed/performed Overall Cognitive Status: Within Functional Limits for tasks assessed                                        Exercises      General Comments General comments (skin integrity, edema, etc.): Pt on RA.  At rests sats 94%, up to 97% with walking and 94% with walking/talking.  Notified RN      Pertinent Vitals/Pain Pain Assessment: No/denies pain    Home Living                      Prior Function            PT Goals (current goals can now be found in the care plan section) Progress towards PT goals: Goals met/education completed, patient discharged from PT    Frequency           PT Plan Other (comment)(no further PT needs)    Co-evaluation              AM-PAC PT "6 Clicks" Mobility   Outcome Measure  Help needed turning from your back to your side while in a flat bed without using bedrails?: None Help needed moving from lying on your  back to sitting on the side of a flat bed without using bedrails?: None Help needed moving to and from a bed to a chair (including a wheelchair)?: None Help needed standing up from a chair using your arms (e.g., wheelchair or bedside chair)?: None Help needed to walk in hospital room?: None Help needed climbing 3-5 steps with a railing? : None 6 Click Score: 24    End of Session Equipment Utilized During Treatment: Gait belt Activity Tolerance: Patient tolerated treatment well Patient left: in chair;with call bell/phone within reach Nurse Communication: Mobility status(safe to walk in room independently)       Time: 1599-6895 PT Time Calculation (min) (ACUTE ONLY): 20 min  Charges:  $Gait Training: 8-22 mins                     Maggie Font, PT Acute Rehab Services Pager [phone] [facility] [phone] [facility] [place] 06/06/2019, 11:21 AM

## 2019-06-06 NOTE — Discharge Instructions (Signed)
Pneumothorax A pneumothorax is commonly called a collapsed lung. It is a condition in which air leaks from a lung and builds up between the thin layer of tissue that covers the lungs (visceral pleura) and the interior wall of the chest cavity (parietal pleura). The air gets trapped outside the lung, between the lung and the chest wall (pleural space). The air takes up space and prevents the lung from fully expanding. This condition sometimes occurs suddenly with no apparent cause. The buildup of air may be small or large. A small pneumothorax may go away on its own. A large pneumothorax will require treatment and hospitalization. What are the causes? This condition may be caused by:  Trauma and injury to the chest wall.  Surgery and other medical procedures.  A complication of an underlying lung problem, especially chronic obstructive pulmonary disease (COPD) or emphysema. Sometimes the cause of this condition is not known. What increases the risk? You are more likely to develop this condition if:  You have an underlying lung problem.  You smoke.  You are 66-34 years old, male, tall, and underweight.  You have a personal or family history of pneumothorax.  You have an eating disorder (anorexia nervosa). This condition can also happen quickly, even in people with no history of lung problems. What are the signs or symptoms? Sometimes a pneumothorax will have no symptoms. When symptoms are present, they can include:  Chest pain.  Shortness of breath.  Increased rate of breathing.  Bluish color to your lips or skin (cyanosis). How is this diagnosed? This condition may be diagnosed by:  A medical history and physical exam.  A chest X-ray, chest CT scan, or ultrasound. How is this treated? Treatment depends on how severe your condition is. The goal of treatment is to remove the extra air and allow your lung to expand back to its normal size.  For a small pneumothorax: ? No  treatment may be needed. ? Extra oxygen is sometimes used to make it go away more quickly.  For a large pneumothorax or a pneumothorax that is causing symptoms, a procedure is done to drain the air from your lungs. To do this, a health care provider may use: ? A needle with a syringe. This is used to suck air from a pleural space where no additional leakage is taking place. ? A chest tube. This is used to suck air where there is ongoing leakage into the pleural space. The chest tube may need to remain in place for several days until the air leak has healed.  In more severe cases, surgery may be needed to repair the damage that is causing the leak.  If you have multiple pneumothorax episodes or have an air leak that will not heal, a procedure called a pleurodesis may be done. A medicine is placed in the pleural space to irritate the tissues around the lung so that the lung will stick to the chest wall, seal any leaks, and stop any buildup of air in that space. If you have an underlying lung problem, severe symptoms, or a large pneumothorax you will usually need to stay in the hospital. Follow these instructions at home: Lifestyle  Do not use any products that contain nicotine or tobacco, such as cigarettes and e-cigarettes. These are major risk factors in pneumothorax. If you need help quitting, ask your health care provider.  Do not lift anything that is heavier than 10 lb (4.5 kg), or the limit that your health care  provider tells you, until he or she says that it is safe.  Avoid activities that take a lot of effort (strenuous) for as long as told by your health care provider.  Return to your normal activities as told by your health care provider. Ask your health care provider what activities are safe for you.  Do not fly in an airplane or scuba dive until your health care provider says it is okay. General instructions  Take over-the-counter and prescription medicines only as told by your  health care provider.  If a cough or pain makes it difficult for you to sleep at night, try sleeping in a semi-upright position in a recliner or by using 2 or 3 pillows.  If you had a chest tube and it was removed, ask your health care provider when you can remove the bandage (dressing). While the dressing is in place, do not allow it to get wet.  Keep all follow-up visits as told by your health care provider. This is important. Contact a health care provider if:  You cough up thick mucus (sputum) that is yellow or green in color.  You were treated with a chest tube, and you have redness, increasing pain, or discharge at the site where it was placed. Get help right away if:  You have increasing chest pain or shortness of breath.  You have a cough that will not go away.  You begin coughing up blood.  You have pain that is getting worse or is not controlled with medicines.  The site where your chest tube was located opens up.  You feel air coming out of the site where the chest tube was placed.  You have a fever or persistent symptoms for more than 2-3 days.  You have a fever and your symptoms suddenly get worse. These symptoms may represent a serious problem that is an emergency. Do not wait to see if the symptoms will go away. Get medical help right away. Call your local emergency services (911 in the [place] Do not drive yourself to the hospital. Summary  A pneumothorax, commonly called a collapsed lung, is a condition in which air leaks from a lung and gets trapped between the lung and the chest wall (pleural space).  The buildup of air may be small or large. A small pneumothorax may go away on its own. A large pneumothorax will require treatment and hospitalization.  Treatment for this condition depends on how severe the pneumothorax is. The goal of treatment is to remove the extra air and allow the lung to expand back to its normal size. This information is not intended to  replace advice given to you by your health care provider. Make sure you discuss any questions you have with your health care provider. Document Revised: 12/01/2016 Document Reviewed: 11/27/2016 Elsevier Patient Education  2020 [employer].

## 2019-06-12 ENCOUNTER — Other Ambulatory Visit: Payer: Self-pay | Admitting: Thoracic Surgery (Cardiothoracic Vascular Surgery)

## 2019-06-12 DIAGNOSIS — J9383 Other pneumothorax: Secondary | ICD-10-CM

## 2019-06-13 ENCOUNTER — Ambulatory Visit: Payer: Medicare HMO | Admitting: Thoracic Surgery (Cardiothoracic Vascular Surgery)

## 2019-06-13 ENCOUNTER — Encounter: Payer: Self-pay | Admitting: Thoracic Surgery (Cardiothoracic Vascular Surgery)

## 2019-06-13 ENCOUNTER — Ambulatory Visit
Admission: RE | Admit: 2019-06-13 | Discharge: 2019-06-13 | Disposition: A | Discharge status: Home / Self Care | Payer: Medicare HMO | Source: Ambulatory Visit | Attending: Thoracic Surgery (Cardiothoracic Vascular Surgery) | Admitting: Thoracic Surgery (Cardiothoracic Vascular Surgery)

## 2019-06-13 ENCOUNTER — Other Ambulatory Visit: Payer: Self-pay

## 2019-06-13 VITALS — BP 151/91 | HR 95 | Temp 97.9°F | Resp 20 | Ht 69.0 in | Wt 160.0 lb

## 2019-06-13 DIAGNOSIS — Z9889 Other specified postprocedural states: Secondary | ICD-10-CM

## 2019-06-13 DIAGNOSIS — J9383 Other pneumothorax: Secondary | ICD-10-CM

## 2019-06-13 NOTE — Progress Notes (Signed)
      [address]       [place] [address]             Norvelt Henly Jr. [place] Record #[id number] Date of Birth: 1955/10/20  Referring: Elodia Florence.,* Primary Care: [name], MD Primary Cardiologist:No primary care provider on file.  Reason for visit:   follow-up  History of Present Illness:     64 year old male presents after hospital admission for a spontaneous pneumothorax that was managed with a chest tube.  He denies any chest pain or shortness of breath.  Physical Exam: BP (!) 151/91 (BP Location: Right Arm)   Pulse 95   Temp 97.9 F (36.6 C) (Temporal)   Resp 20   Ht 5\' 9"  (1.753 m)   Wt 160 lb (72.6 kg)   SpO2 96% Comment: RA  BMI 23.63 kg/m   Alert NAD  No peripheral edema   Diagnostic Studies & Laboratory data: CXR: Good expansion of the lung, no pneumothorax.     Assessment / Plan:   64 year old male status post chest tube placement for spontaneous pneumothorax, in the setting of bullous emphysema.  His chest x-ray shows no pneumothorax.  Continue pulmonary toilet  Follow-up as needed   Lajuana Matte 06/13/2019 11:50 AM

## 2019-06-17 ENCOUNTER — Ambulatory Visit (HOSPITAL_COMMUNITY)
Admission: RE | Admit: 2019-06-17 | Discharge: 2019-06-17 | Disposition: A | Discharge status: Home / Self Care | Payer: Medicare HMO | Source: Ambulatory Visit | Attending: Radiation Oncology | Admitting: Radiation Oncology

## 2019-06-17 ENCOUNTER — Other Ambulatory Visit: Payer: Self-pay

## 2019-06-17 DIAGNOSIS — R911 Solitary pulmonary nodule: Secondary | ICD-10-CM

## 2019-06-18 NOTE — Progress Notes (Addendum)
Radiation Oncology         [phone]) [phone] ________________________________  Name: Terry Page. MRN: [id number]  Date: 06/19/2019  DOB: 1955-05-25  Follow-Up Visit Note  CC: Terry Macadam, MD  Terry Macadam, MD    ICD-10-CM   1. Incidental lung nodule, greater than or equal to 56mm  R91.1 CT Chest W Contrast    BUN & Creatinine ([facility])    Diagnosis: PET positive solitary nodule presenting in the left upper lobe   Interval Since Last Radiation: One year, two months, and one week  04/03/2018, 04/10/2018, 04/12/2018: LUL / 54 Gy in 3 fractions (SBRT)  Narrative: The patient returns today for routine follow-up. Since his last visit, the patient underwent a chest CT scan on 03/06/2019. Results showed a slowly enlarging left upper lobe nodule that was suspicious for metachronous primary or metastatic lesion. It also showed post-treatment changes in the left chest with small nodular area in the left upper lobe not seen separate from the bandlike areas of scarring. 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 I called that patient on that same day to discuss the CT findings and consideration of SBRT to the new lesion versus reviewing a chest CT scan with a short interval of three months. At that time, the patient felt most comfortable with repeating a CT scan in three months and evaluating from there.  Of note, the patient underwent a TRUSP on 05/02/2019 secondary to elevated PSA of 19 found on routine labs. This was performed by Dr. Link Page at [facility]. The prostate volume measured 20.74 cc. Out of twelve core biopsies, nine were positive. The maximum Gleason score was 3+4, and this was seen in the right mid, right apex, and  left apex. Additionally, Gleason 3+3 was seen in the left mid lateral (small focus), left apex lateral, left base, left mid, right mid lateral, and right apex lateral.  The patient was referred to Dr. Tammi Page and was seen in consultation on 05/20/2019 to discuss potential radiation treatment options for his newly diagnosed stage T1c adenocarcinoma of the prostate. At that time, his staging scans were pending, and so they discussed a variety of potential treatment options including brachytherapy, 5.5 weeks of external radiation, and prostatectomy. Given his small prostate size and high PSA, the recommendation was for a 5-1/2-week course of prostate IMRT if there was no evidence of advanced disease with distant metastasis.  Bone scan on 05/26/2019 showed findings consistent with degenerative changes. There was no evidence of metastatic disease.  The patient was see in the ED on 06/01/2019 with chief complaint of shortness of breath. Chest x-ray at that time showed a sizeable pneumothorax on the right with mild tension component. There was a degree of underlying emphysematous change. Finally, there was scarring noted in the left lung without edema or airspace opacity. The patient had a right chest tube placed by Dr. Ralene Page with resolution of the pneumothorax. On repeat chest x-ray, there was bullous change in the upper lobes and a small amount of gas along the right chest wall. The patient was admitted to the hospital for further management. He was discharged on 06/06/2019.  The patient admits he started back smoking which he feels triggered of the pneumothorax.  he since then has not resumed cigarette smoking  Most recent  CT of chest on 06/17/2019 was stable with no new or progressive findings. The 9 mm irregular central left upper lobe pulmonary nodule was stable.    On review of systems, he reports no complaints. He denies any pain.  No reports of hemoptysis  ALLERGIES:  has No Known  Allergies.  Meds: Current Outpatient Medications  Medication Sig Dispense Refill  . albuterol (VENTOLIN HFA) 108 (90 Base) MCG/ACT inhaler Inhale 2 puffs into the lungs every 6 (six) hours as needed for wheezing or shortness of breath. 8 g 1  . amLODipine (NORVASC) 10 MG tablet Take 1 tablet (10 mg total) by mouth daily. 30 tablet 0  . aspirin EC 81 MG tablet Take 81 mg by mouth daily.    [name] atorvastatin (LIPITOR) 20 MG tablet Take 20 mg by mouth daily.    [name] docusate sodium (COLACE) 100 MG capsule Take 100 mg by mouth daily.     . folic acid (FOLVITE) 1 MG tablet Take 1 tablet (1 mg total) by mouth daily. 30 tablet 0  . Multiple Vitamin (MULTIVITAMIN WITH MINERALS) TABS tablet Take 1 tablet by mouth daily. 30 tablet 0  . olmesartan (BENICAR) 40 MG tablet Take 40 mg by mouth daily.  1  . sodium chloride 1 g tablet Take 1 tablet (1 g total) by mouth 3 (three) times daily with meals. 90 tablet 0  . thiamine 100 MG tablet Take 1 tablet (100 mg total) by mouth daily. 30 tablet 0   No current facility-administered medications for this encounter.    Physical Findings: The patient is in no acute distress. Patient is alert and oriented.  height is 5\' 9"  (1.753 m) and weight is 158 lb 12.8 oz (72 kg). His temperature is 97.5 F (36.4 C) (abnormal). His respiration is 18 and oxygen saturation is 99%. . Lungs are clear to auscultation bilaterally. Heart has regular rate and rhythm. No palpable cervical, supraclavicular, or axillary adenopathy. Abdomen soft, non-tender, normal bowel sounds.   Lab Findings: Lab Results  Component Value Date   WBC 11.6 (H) 06/06/2019   HGB 12.8 (L) 06/06/2019   HCT 39.1 06/06/2019   MCV 92.7 06/06/2019   PLT 295 06/06/2019    Radiographic Findings: DG Chest 2 View  Result Date: 06/13/2019 CLINICAL DATA:  Follow-up spontaneous pneumothorax 2 weeks ago. EXAM: CHEST - 2 VIEW COMPARISON:  06/06/2019 FINDINGS: Normal sized heart. The lungs remain hyperexpanded with  bilateral bullous changes. No pneumothorax. Thoracic spine degenerative changes. IMPRESSION: 1. No pneumothorax. 2. Changes of COPD. Electronically Signed   By: Claudie Revering M.D.   On: 06/13/2019 11:11   DG Chest 2 View  Result Date: 06/05/2019 CLINICAL DATA:  Chest tube. EXAM: CHEST - 2 VIEW COMPARISON:  June 04, 2019. FINDINGS: The heart size and mediastinal contours are within normal limits. Stable position of right-sided chest tube. No definite pneumothorax or pleural effusion is noted. Probable emphysematous disease is noted in both upper lobes. Stable scarring is seen in left midlung and lingular region. The visualized skeletal structures are unremarkable. IMPRESSION: Stable position of right-sided chest tube. No definite pneumothorax is noted. Aortic Atherosclerosis (ICD10-I70.0) and Emphysema (ICD10-J43.9). Electronically Signed   By: Marijo Conception M.D.   On: 06/05/2019 13:03   CT Chest Wo Contrast  Result Date: 06/17/2019 CLINICAL DATA:  History of right lung cancer. Right pneumothorax 06/01/2019. EXAM: CT CHEST WITHOUT CONTRAST TECHNIQUE: Multidetector CT imaging of the chest was performed following the standard protocol without IV contrast. COMPARISON:  03/06/2019 FINDINGS: Cardiovascular: The heart size is normal. No substantial pericardial effusion. Coronary artery calcification is evident. Atherosclerotic calcification is noted in the wall of the thoracic aorta. Mediastinum/Nodes: No mediastinal lymphadenopathy. No evidence for gross hilar lymphadenopathy although assessment is limited by the lack of intravenous contrast on today's study. The esophagus has normal imaging features. There is no axillary lymphadenopathy. Lungs/Pleura: Centrilobular and paraseptal emphysema evident. Bullous change noted in both upper lobes. Stable 8 mm subpleural nodule posterior right lung on image 55/5. Stable bandlike atelectasis or scarring anterior left upper lobe. No substantial change in the 9 mm irregular  central left upper lobe nodule visible on image 50/series 5.Post treatment scarring posterior left lung is similar to prior. No new suspicious pulmonary nodule or mass. Upper Abdomen: Small hypoattenuating lesion in the dome of the left liver is stable, likely benign. Similar tiny hypodensity posterior right liver is also unchanged. Musculoskeletal: No worrisome lytic or sclerotic osseous abnormality. IMPRESSION: 1. Stable exam. No new or progressive findings. 2. Stable 9 mm irregular central left upper lobe pulmonary nodule. Close continued attention recommended as metastatic disease or metachronous primary cannot be excluded. 3. Aortic Atherosclerosis (ICD10-I70.0) and Emphysema (ICD10-J43.9). Electronically Signed   By: Misty Stanley M.D.   On: 06/17/2019 10:16   NM Bone Scan Whole Body  Result Date: 05/27/2019 CLINICAL DATA:  Prostate cancer. EXAM: NUCLEAR MEDICINE WHOLE BODY BONE SCAN TECHNIQUE: Whole body anterior and posterior images were obtained approximately 3 hours after intravenous injection of radiopharmaceutical. RADIOPHARMACEUTICALS:  21.1 mCi Technetium-41m MDP IV COMPARISON:  PET CT 12/21/2017. FINDINGS: Bilateral renal function excretion. Mild increased activity both hips, right side greater than left. Disc car spine degenerative change noted on recent PET-CT. Mild increased activity both shoulders, bilateral upper costochondral regions, and sternomanubrial region most likely degenerative. Degenerative changes noted this region on recent PET-CT. IMPRESSION: Findings consistent degenerative change as above. No evidence of metastatic disease. Electronically Signed   By: Marcello Moores  Register   On: 05/27/2019 06:19   DG Chest Port 1 View  Result Date: 06/06/2019 CLINICAL DATA:  64 year old male right side chest tube removal yesterday. Presented with right side spontaneous tension pneumothorax. Bullous emphysema. EXAM: PORTABLE CHEST 1 VIEW COMPARISON:  06/05/2019 chest radiographs and earlier.  FINDINGS: Portable AP upright view at 0508 hours. Upper lobe bullous emphysema again noted. No pneumothorax identified. Stable small volume right chest wall subcutaneous gas. Stable lung parenchyma elsewhere, mild architectural distortion. No pleural effusion. Stable cardiac size and mediastinal contours. Stable visualized osseous structures. IMPRESSION: No pneumothorax following chest tube removal. Stable bolus emphysema. Electronically Signed   By: Genevie Ann M.D.   On: 06/06/2019 08:13   DG CHEST PORT 1 VIEW  Result Date: 06/04/2019 CLINICAL DATA:  Chest tube present. EXAM: PORTABLE CHEST 1 VIEW COMPARISON:  June 03, 2019. FINDINGS: The heart size and mediastinal contours are within normal limits. Stable position of right-sided chest tube without definite pneumothorax. Stable scarring is seen in left midlung and lingular region. Atherosclerosis of thoracic aorta is noted. No pleural effusion is noted. The visualized skeletal structures are unremarkable. IMPRESSION: Stable position of right-sided chest tube without definite pneumothorax. Stable scarring seen in left midlung and lingular region. Aortic Atherosclerosis (ICD10-I70.0). Electronically Signed   By: Marijo Conception M.D.   On: 06/04/2019 12:05   DG Chest Port 1 View  Result Date: 06/03/2019 CLINICAL DATA:  Right pneumothorax, right chest, cough EXAM: PORTABLE CHEST 1 VIEW COMPARISON:  06/01/2019 FINDINGS: Stable position of the right pigtail chest  tube extending to the apex. No significant residual pneumothorax by plain radiography. Similar right chest wall subcutaneous emphysema evident. Stable background bullous emphysema and left mid lung and lingula scarring. No new collapse or consolidation. Negative for edema or effusion. Stable heart size and vascularity. Aorta atherosclerotic. Degenerative changes of the spine. IMPRESSION: Stable right chest tube. No significant pneumothorax by plain radiography. Similar chronic changes of the chest as above.  Electronically Signed   By: Jerilynn Mages.  Shick M.D.   On: 06/03/2019 11:39   DG Chest Port 1 View  Result Date: 06/01/2019 CLINICAL DATA:  Post chest tube placement Pt bib ems from home with sudden onset of sob approx 1 hour pta. Given 2 neb pta, 125mg  solu medrol. Hx copd, hx spontaneous pneumopigtail catheter placement EXAM: PORTABLE CHEST 1 VIEW COMPARISON:  Radiograph 06/01/2019 at 12:30 p.m. FINDINGS: Interval placement of a small bore RIGHT chest tube. Interval expansion the lung and resolution the pneumothorax. No RIGHT pneumothorax appreciated. Small amount subcutaneous gas along the RIGHT chest wall noted. Persistant atelectasis in the LEFT lung similar prior. Bullous change in the upper lobes. IMPRESSION: 1. Resolution of RIGHT pneumothorax following chest tube placement. 2. Bullous change in the upper lobes. 3. Small amount of gas along the RIGHT chest wall. Electronically Signed   By: Suzy Bouchard M.D.   On: 06/01/2019 13:43   DG Chest Port 1 View  Result Date: 06/01/2019 CLINICAL DATA:  Shortness of breath EXAM: PORTABLE CHEST 1 VIEW COMPARISON:  November 05, 2017 FINDINGS: There is a sizable pneumothorax on the right with mild tension component. There are scattered areas of scarring throughout the left lung. No edema or airspace opacity. Heart size is within normal limits. No adenopathy. Pulmonary vascularity reflects a degree of underlying emphysematous change. There is aortic atherosclerosis. No bone lesions. IMPRESSION: Sizable pneumothorax on the right with mild tension component. There is a degree of underlying emphysematous change. Scarring noted in the left lung. No edema or airspace opacity. Heart size within normal limits. There is aortic atherosclerosis. Aortic Atherosclerosis (ICD10-I70.0) and Emphysema (ICD10-J43.9). Critical Value/emergent results were called by telephone at the time of interpretation on 06/01/2019 at 12:37 pm to provider [facility] , who verbally acknowledged these  results. Electronically Signed   By: Lowella Grip III M.D.   On: 06/01/2019 12:38    Impression:  PET positive solitary nodule presenting in the left upper lobe, s/p stereotactic body radiation therapy.    Recent chest CT scan shows stability without any new enlarging lesions.  Plan: The patient will be scheduled for a chest CT scan in 6 months and follow-up soon afterward.  He is being evaluated for treatment for his prostate cancer.  Per his discussion he will proceed with external beam radiation therapy (IMRT)  Total time spent in this encounter was 22 minutes which included reviewing the patient's most recent chest CT scan, ED/hospital admission, physical examination, new diagnosis of prostate cancer and documentation.  ____________________________________   Blair Promise, PhD, MD  This document serves as a record of services personally performed by Gery Pray, MD. It was created on his behalf by Clerance Lav, a trained medical scribe. The creation of this record is based on the scribe's personal observations and the provider's statements to them. This document has been checked and approved by the attending provider.

## 2019-06-19 ENCOUNTER — Encounter: Payer: Self-pay | Admitting: Radiation Oncology

## 2019-06-19 ENCOUNTER — Ambulatory Visit
Admission: RE | Admit: 2019-06-19 | Discharge: 2019-06-19 | Disposition: A | Discharge status: Home / Self Care | Payer: Medicare HMO | Source: Ambulatory Visit | Attending: Radiation Oncology | Admitting: Radiation Oncology

## 2019-06-19 ENCOUNTER — Other Ambulatory Visit: Payer: Self-pay

## 2019-06-19 DIAGNOSIS — R911 Solitary pulmonary nodule: Secondary | ICD-10-CM | POA: Diagnosis not present

## 2019-06-19 DIAGNOSIS — Z79899 Other long term (current) drug therapy: Secondary | ICD-10-CM | POA: Insufficient documentation

## 2019-06-19 DIAGNOSIS — J449 Chronic obstructive pulmonary disease, unspecified: Secondary | ICD-10-CM | POA: Insufficient documentation

## 2019-06-19 DIAGNOSIS — Z7982 Long term (current) use of aspirin: Secondary | ICD-10-CM | POA: Diagnosis not present

## 2019-06-19 DIAGNOSIS — C61 Malignant neoplasm of prostate: Secondary | ICD-10-CM | POA: Diagnosis not present

## 2019-06-19 DIAGNOSIS — Z87891 Personal history of nicotine dependence: Secondary | ICD-10-CM | POA: Insufficient documentation

## 2019-06-19 DIAGNOSIS — Z923 Personal history of irradiation: Secondary | ICD-10-CM | POA: Insufficient documentation

## 2019-06-19 NOTE — Progress Notes (Signed)
Patient here for a f/u visit with Dr. Sondra Come. Denies pain or other problems today.  Temp (!) 97.5 F (36.4 C)   Resp 18   Ht 5\' 9"  (1.753 m)   Wt 158 lb 12.8 oz (72 kg)   SpO2 99%   BMI 23.45 kg/m    Wt Readings from Last 3 Encounters:  06/19/19 158 lb 12.8 oz (72 kg)  06/13/19 160 lb (72.6 kg)  06/02/19 162 lb 0.6 oz (73.5 kg)

## 2019-06-27 ENCOUNTER — Telehealth: Payer: Self-pay | Admitting: *Deleted

## 2019-06-27 NOTE — Telephone Encounter (Signed)
Called patient to inform of sim appt. on 07-29-19 @ 10:30 am @ Dr. Johny Shears office, no vm, therefore unable to leave message, will call later

## 2019-06-30 ENCOUNTER — Encounter: Payer: Self-pay | Admitting: Medical Oncology

## 2019-06-30 NOTE — Progress Notes (Signed)
I had reached out to patient a few weeks ago and had to leave a message. He did not return my call due to being hospitalized for a pneumothorax. I introduced myself as the prostate nurse navigator and discussed my role. He states he is doing well and his main concern is transportation. He is scheduled for fiducial markers/SpaceOar gel 7/22, with [facility]. I informed him, I would not be able to get him transportation to this appointment. He states he can get someone to bring him this day. I discussed the purpose of the markers and SpaceOar gel.[name] He is scheduled for CT simulation 7/27, and I explained what will take place during the planning session. I will arrange transportation for this appointment and his radiation treatments. He was very appreciative of my help and voiced, he feels inspired after our conversation. I asked him to reach out with questions or concerns. He voiced understanding.

## 2019-07-10 ENCOUNTER — Other Ambulatory Visit: Payer: Self-pay | Admitting: Urology

## 2019-07-10 DIAGNOSIS — C61 Malignant neoplasm of prostate: Secondary | ICD-10-CM

## 2019-07-24 ENCOUNTER — Encounter: Payer: Self-pay | Admitting: Medical Oncology

## 2019-07-24 ENCOUNTER — Telehealth: Payer: Self-pay | Admitting: Urology

## 2019-07-24 NOTE — Telephone Encounter (Signed)
I received a message from Cira Rue, RN, prostate nurse navigator, regarding a request for a follow-up phone call with this patient.  I attempted to reach the patient but had to leave a message on his answering machine.  I left a detailed message regarding moving forward with CT SIM/treatment planning for prostate IMRT without fiducial markers and SpaceOAR gel since he has refused this procedure in a recent meeting with Dr. Gloriann Loan.  Per Dr. Tammi Klippel, we can proceed with prostate IMRT without fiducial markers and SpaceOAR gel with the understanding that the treatment set up will take some additional time each day and instead of a less than 1% chance of developing intermittent rectal bleeding down the road, without the SpaceOAR gel, we are looking at a 5% chance of developing intermittent rectal bleeding after prostate treatment.  I advised that he is welcome to call back at anytime should he have further questions and I am happy to speak with him.  Otherwise, if he is comfortable, he can call and let Romie Jumper know that he prefers to move forward with rescheduling his CT simulation/treatment planning and we will get this set up for him.  Nicholos Johns, MMS, PA-C [place] [facility] [place]: [phone]  Fax: [phone]

## 2019-07-24 NOTE — Progress Notes (Signed)
Spoke with patient to let him know he can receive prostate radiation without gold markers and SpaceOar gel. I explained it will take the therapist longer to align him without the markers but it can be done safely. I informed him without the gel, there is an increased risk of more side effects. He states he is open to doing the radiation but would like to hear it from the physician. I asked Ashlyn, PA to reach out to patient.

## 2019-07-24 NOTE — Telephone Encounter (Signed)
Patient called back to confirm that he would like to proceed with the 5-1/2-week course of daily prostate IMRT with out fiducial markers or SpaceOAR gel.  We have tentatively scheduled him for Friday, 08/01/2019 at 10:30 AM for CT SIM/treatment planning pending we can get transportation assistance set up for this time and date.  Cira Rue will work with him to confirm transportation to this appointment and once we have his schedule for daily treatments, she will also help to make arrangements for transportation assistance for his daily visits.  Nicholos Johns, MMS, PA-C [facility] [place]: [phone]  Fax: [phone]

## 2019-07-25 ENCOUNTER — Encounter: Payer: Self-pay | Admitting: Medical Oncology

## 2019-07-25 NOTE — Progress Notes (Signed)
Spoke with patient to confirm he does not use walker or walking cane to assist him. He states no. I spoke with Di Kindle and  completed forms for transportation. I informed patient he is scheduled for CT simulation 7/30 arriving at 10:15 am. He is aware he will be contacted by transportation to set up his ride. He voiced understanding of the above.

## 2019-07-29 ENCOUNTER — Ambulatory Visit: Payer: Medicare HMO | Admitting: Radiation Oncology

## 2019-07-31 ENCOUNTER — Telehealth: Payer: Self-pay | Admitting: *Deleted

## 2019-07-31 ENCOUNTER — Encounter: Payer: Self-pay | Admitting: Medical Oncology

## 2019-07-31 NOTE — Telephone Encounter (Signed)
Called patient to inform that sim appt. has been moved to 08-08-19 - arrival time- 12:45 pm @ [facility], spoke with patient and he is aware of this appt.

## 2019-07-31 NOTE — Progress Notes (Signed)
Spoke with patient to confirm CT simulation appointment has been rescheduled to 8/6 @ 1:00 pm arriving at 12:45 pm. I sent forms to cancel (7/30) and reschedule transportation for 8/6. He was very appreciative of my help and knows to call me if there are any changes or questions.

## 2019-07-31 NOTE — Telephone Encounter (Signed)
Called patient to inform that sim appt. has been moved to 08-05-19 - arrival time- 8:45 am, spoke with patient and he is aware of this appt.

## 2019-07-31 NOTE — Telephone Encounter (Signed)
CALLED PATIENT TO REMIND OF SIM FOR 08-01-19- ARRIVAL TIME- 10:15 AM @ [place], SPOKE WITH PATIENT AND HE IS AWARE OF THIS APPT.

## 2019-08-01 ENCOUNTER — Ambulatory Visit: Payer: Medicare HMO | Admitting: Radiation Oncology

## 2019-08-05 ENCOUNTER — Ambulatory Visit: Payer: Medicare HMO | Admitting: Radiation Oncology

## 2019-08-07 ENCOUNTER — Telehealth: Payer: Self-pay | Admitting: *Deleted

## 2019-08-07 NOTE — Telephone Encounter (Signed)
Called patient to remind of sim appt. for 08-08-19- arrival time- 12:45 pm, spoke with patient and he is aware of this appt.

## 2019-08-08 ENCOUNTER — Ambulatory Visit
Admission: RE | Admit: 2019-08-08 | Discharge: 2019-08-08 | Disposition: A | Discharge status: Home / Self Care | Payer: Medicare HMO | Source: Ambulatory Visit | Attending: Radiation Oncology | Admitting: Radiation Oncology

## 2019-08-08 ENCOUNTER — Other Ambulatory Visit: Payer: Self-pay

## 2019-08-08 DIAGNOSIS — C61 Malignant neoplasm of prostate: Secondary | ICD-10-CM

## 2019-08-08 DIAGNOSIS — Z510 Encounter for antineoplastic radiation therapy: Secondary | ICD-10-CM | POA: Insufficient documentation

## 2019-08-11 ENCOUNTER — Encounter: Payer: Self-pay | Admitting: Medical Oncology

## 2019-08-17 NOTE — Progress Notes (Signed)
  Radiation Oncology         [phone]) [phone] ________________________________  Name: Terry Page. MRN: [id number]  Date: 08/08/2019  DOB: 03-08-1955  SIMULATION AND TREATMENT PLANNING NOTE    ICD-10-CM   1. Malignant neoplasm of prostate ([place])  C61     DIAGNOSIS:  63 y.o. gentleman with Stage T1c adenocarcinoma of the prostate with Gleason score of 3+4, and PSA of 18.1  NARRATIVE:  The patient was brought to the [facility].  Identity was confirmed.  All relevant records and images related to the planned course of therapy were reviewed.  The patient freely provided informed written consent to proceed with treatment after reviewing the details related to the planned course of therapy. The consent form was witnessed and verified by the simulation staff.  Then, the patient was set-up in a stable reproducible supine position for radiation therapy.  A vacuum lock pillow device was custom fabricated to position his legs in a reproducible immobilized position.  Then, I performed a urethrogram under sterile conditions to identify the prostatic apex.  CT images were obtained.  Surface markings were placed.  The CT images were loaded into the planning software.  Then the prostate target and avoidance structures including the rectum, bladder, bowel and hips were contoured.  Treatment planning then occurred.  The radiation prescription was entered and confirmed.  A total of one complex treatment devices was fabricated. I have requested : Intensity Modulated Radiotherapy (IMRT) is medically necessary for this case for the following reason:  Rectal sparing.[name]  PLAN:  The patient will receive 70 Gy in 28 fractions.  ________________________________  Sheral Apley Tammi Klippel, M.D.

## 2019-08-18 ENCOUNTER — Encounter: Payer: Self-pay | Admitting: Medical Oncology

## 2019-08-18 DIAGNOSIS — Z510 Encounter for antineoplastic radiation therapy: Secondary | ICD-10-CM | POA: Diagnosis not present

## 2019-08-18 NOTE — Progress Notes (Signed)
Spoke with patient to confirm he has been contacted by transportation for start of radiation tomorrow. He states has been contacted and everything is set on go. He was very appreciative of my call.

## 2019-08-19 ENCOUNTER — Ambulatory Visit
Admission: RE | Admit: 2019-08-19 | Discharge: 2019-08-19 | Disposition: A | Discharge status: Home / Self Care | Payer: Medicare HMO | Source: Ambulatory Visit | Attending: Radiation Oncology | Admitting: Radiation Oncology

## 2019-08-19 ENCOUNTER — Encounter: Payer: Self-pay | Admitting: Medical Oncology

## 2019-08-19 ENCOUNTER — Other Ambulatory Visit: Payer: Self-pay

## 2019-08-19 DIAGNOSIS — Z510 Encounter for antineoplastic radiation therapy: Secondary | ICD-10-CM | POA: Diagnosis not present

## 2019-08-20 ENCOUNTER — Other Ambulatory Visit: Payer: Self-pay

## 2019-08-20 ENCOUNTER — Ambulatory Visit
Admission: RE | Admit: 2019-08-20 | Discharge: 2019-08-20 | Disposition: A | Discharge status: Home / Self Care | Payer: Medicare HMO | Source: Ambulatory Visit | Attending: Radiation Oncology | Admitting: Radiation Oncology

## 2019-08-20 DIAGNOSIS — Z510 Encounter for antineoplastic radiation therapy: Secondary | ICD-10-CM | POA: Diagnosis not present

## 2019-08-21 ENCOUNTER — Other Ambulatory Visit: Payer: Self-pay

## 2019-08-21 ENCOUNTER — Ambulatory Visit
Admission: RE | Admit: 2019-08-21 | Discharge: 2019-08-21 | Disposition: A | Discharge status: Home / Self Care | Payer: Medicare HMO | Source: Ambulatory Visit | Attending: Radiation Oncology | Admitting: Radiation Oncology

## 2019-08-21 DIAGNOSIS — Z510 Encounter for antineoplastic radiation therapy: Secondary | ICD-10-CM | POA: Diagnosis not present

## 2019-08-22 ENCOUNTER — Other Ambulatory Visit: Payer: Self-pay

## 2019-08-22 ENCOUNTER — Ambulatory Visit
Admission: RE | Admit: 2019-08-22 | Discharge: 2019-08-22 | Disposition: A | Discharge status: Home / Self Care | Payer: Medicare HMO | Source: Ambulatory Visit | Attending: Radiation Oncology | Admitting: Radiation Oncology

## 2019-08-22 DIAGNOSIS — Z510 Encounter for antineoplastic radiation therapy: Secondary | ICD-10-CM | POA: Diagnosis not present

## 2019-08-25 ENCOUNTER — Ambulatory Visit: Payer: Self-pay | Admitting: Radiation Oncology

## 2019-08-25 ENCOUNTER — Ambulatory Visit: Payer: Medicare HMO

## 2019-08-26 ENCOUNTER — Other Ambulatory Visit: Payer: Self-pay

## 2019-08-26 ENCOUNTER — Ambulatory Visit
Admission: RE | Admit: 2019-08-26 | Discharge: 2019-08-26 | Disposition: A | Discharge status: Home / Self Care | Payer: Medicare HMO | Source: Ambulatory Visit | Attending: Radiation Oncology | Admitting: Radiation Oncology

## 2019-08-26 DIAGNOSIS — Z510 Encounter for antineoplastic radiation therapy: Secondary | ICD-10-CM | POA: Diagnosis not present

## 2019-08-27 ENCOUNTER — Other Ambulatory Visit: Payer: Self-pay

## 2019-08-27 ENCOUNTER — Ambulatory Visit
Admission: RE | Admit: 2019-08-27 | Discharge: 2019-08-27 | Disposition: A | Discharge status: Home / Self Care | Payer: Medicare HMO | Source: Ambulatory Visit | Attending: Radiation Oncology | Admitting: Radiation Oncology

## 2019-08-27 DIAGNOSIS — Z510 Encounter for antineoplastic radiation therapy: Secondary | ICD-10-CM | POA: Diagnosis not present

## 2019-08-28 ENCOUNTER — Ambulatory Visit
Admission: RE | Admit: 2019-08-28 | Discharge: 2019-08-28 | Disposition: A | Discharge status: Home / Self Care | Payer: Medicare HMO | Source: Ambulatory Visit | Attending: Radiation Oncology | Admitting: Radiation Oncology

## 2019-08-28 ENCOUNTER — Other Ambulatory Visit: Payer: Self-pay

## 2019-08-28 DIAGNOSIS — Z510 Encounter for antineoplastic radiation therapy: Secondary | ICD-10-CM | POA: Diagnosis not present

## 2019-08-29 ENCOUNTER — Ambulatory Visit
Admission: RE | Admit: 2019-08-29 | Discharge: 2019-08-29 | Disposition: A | Discharge status: Home / Self Care | Payer: Medicare HMO | Source: Ambulatory Visit | Attending: Radiation Oncology | Admitting: Radiation Oncology

## 2019-08-29 ENCOUNTER — Encounter: Payer: Self-pay | Admitting: Medical Oncology

## 2019-08-29 DIAGNOSIS — Z510 Encounter for antineoplastic radiation therapy: Secondary | ICD-10-CM | POA: Diagnosis not present

## 2019-08-29 NOTE — Progress Notes (Signed)
Spoke with transportation department to confirm transportation for week of 8/30-9/3.

## 2019-09-01 ENCOUNTER — Ambulatory Visit
Admission: RE | Admit: 2019-09-01 | Discharge: 2019-09-01 | Disposition: A | Discharge status: Home / Self Care | Payer: Medicare HMO | Source: Ambulatory Visit | Attending: Radiation Oncology | Admitting: Radiation Oncology

## 2019-09-01 ENCOUNTER — Other Ambulatory Visit: Payer: Self-pay

## 2019-09-01 DIAGNOSIS — Z510 Encounter for antineoplastic radiation therapy: Secondary | ICD-10-CM | POA: Diagnosis not present

## 2019-09-02 ENCOUNTER — Other Ambulatory Visit: Payer: Self-pay

## 2019-09-02 ENCOUNTER — Ambulatory Visit
Admission: RE | Admit: 2019-09-02 | Discharge: 2019-09-02 | Disposition: A | Discharge status: Home / Self Care | Payer: Medicare HMO | Source: Ambulatory Visit | Attending: Radiation Oncology | Admitting: Radiation Oncology

## 2019-09-02 DIAGNOSIS — Z510 Encounter for antineoplastic radiation therapy: Secondary | ICD-10-CM | POA: Diagnosis not present

## 2019-09-03 ENCOUNTER — Other Ambulatory Visit: Payer: Self-pay

## 2019-09-03 ENCOUNTER — Ambulatory Visit
Admission: RE | Admit: 2019-09-03 | Discharge: 2019-09-03 | Disposition: A | Discharge status: Home / Self Care | Payer: Medicare HMO | Source: Ambulatory Visit | Attending: Radiation Oncology | Admitting: Radiation Oncology

## 2019-09-03 DIAGNOSIS — Z510 Encounter for antineoplastic radiation therapy: Secondary | ICD-10-CM | POA: Insufficient documentation

## 2019-09-03 DIAGNOSIS — C61 Malignant neoplasm of prostate: Secondary | ICD-10-CM | POA: Insufficient documentation

## 2019-09-04 ENCOUNTER — Ambulatory Visit
Admission: RE | Admit: 2019-09-04 | Discharge: 2019-09-04 | Disposition: A | Discharge status: Home / Self Care | Payer: Medicare HMO | Source: Ambulatory Visit | Attending: Radiation Oncology | Admitting: Radiation Oncology

## 2019-09-04 ENCOUNTER — Other Ambulatory Visit: Payer: Self-pay

## 2019-09-04 DIAGNOSIS — Z510 Encounter for antineoplastic radiation therapy: Secondary | ICD-10-CM | POA: Diagnosis not present

## 2019-09-05 ENCOUNTER — Ambulatory Visit
Admission: RE | Admit: 2019-09-05 | Discharge: 2019-09-05 | Disposition: A | Discharge status: Home / Self Care | Payer: Medicare HMO | Source: Ambulatory Visit | Attending: Radiation Oncology | Admitting: Radiation Oncology

## 2019-09-05 ENCOUNTER — Other Ambulatory Visit: Payer: Self-pay

## 2019-09-05 DIAGNOSIS — Z510 Encounter for antineoplastic radiation therapy: Secondary | ICD-10-CM | POA: Diagnosis not present

## 2019-09-09 ENCOUNTER — Ambulatory Visit
Admission: RE | Admit: 2019-09-09 | Discharge: 2019-09-09 | Disposition: A | Discharge status: Home / Self Care | Payer: Medicare HMO | Source: Ambulatory Visit | Attending: Radiation Oncology | Admitting: Radiation Oncology

## 2019-09-09 ENCOUNTER — Other Ambulatory Visit: Payer: Self-pay

## 2019-09-09 DIAGNOSIS — Z510 Encounter for antineoplastic radiation therapy: Secondary | ICD-10-CM | POA: Diagnosis not present

## 2019-09-10 ENCOUNTER — Ambulatory Visit
Admission: RE | Admit: 2019-09-10 | Discharge: 2019-09-10 | Disposition: A | Discharge status: Home / Self Care | Payer: Medicare HMO | Source: Ambulatory Visit | Attending: Radiation Oncology | Admitting: Radiation Oncology

## 2019-09-10 ENCOUNTER — Other Ambulatory Visit: Payer: Self-pay

## 2019-09-10 DIAGNOSIS — Z510 Encounter for antineoplastic radiation therapy: Secondary | ICD-10-CM | POA: Diagnosis not present

## 2019-09-11 ENCOUNTER — Ambulatory Visit: Payer: Medicare HMO

## 2019-09-12 ENCOUNTER — Ambulatory Visit: Payer: Medicare HMO

## 2019-09-12 ENCOUNTER — Telehealth: Payer: Self-pay | Admitting: Radiation Oncology

## 2019-09-12 NOTE — Telephone Encounter (Signed)
Noted patient has missed last two radiation treatment appointments. Per therapist patient not feeling well. Phoned patient to inquire. No answer. Left voicemail message requesting return call. Awaiting call back.

## 2019-09-15 ENCOUNTER — Ambulatory Visit
Admission: RE | Admit: 2019-09-15 | Discharge: 2019-09-15 | Disposition: A | Discharge status: Home / Self Care | Payer: Medicare HMO | Source: Ambulatory Visit | Attending: Radiation Oncology | Admitting: Radiation Oncology

## 2019-09-15 ENCOUNTER — Other Ambulatory Visit: Payer: Self-pay

## 2019-09-15 DIAGNOSIS — Z510 Encounter for antineoplastic radiation therapy: Secondary | ICD-10-CM | POA: Diagnosis not present

## 2019-09-16 ENCOUNTER — Other Ambulatory Visit: Payer: Self-pay

## 2019-09-16 ENCOUNTER — Ambulatory Visit
Admission: RE | Admit: 2019-09-16 | Discharge: 2019-09-16 | Disposition: A | Discharge status: Home / Self Care | Payer: Medicare HMO | Source: Ambulatory Visit | Attending: Radiation Oncology | Admitting: Radiation Oncology

## 2019-09-16 DIAGNOSIS — Z510 Encounter for antineoplastic radiation therapy: Secondary | ICD-10-CM | POA: Diagnosis not present

## 2019-09-17 ENCOUNTER — Other Ambulatory Visit: Payer: Self-pay

## 2019-09-17 ENCOUNTER — Ambulatory Visit
Admission: RE | Admit: 2019-09-17 | Discharge: 2019-09-17 | Disposition: A | Discharge status: Home / Self Care | Payer: Medicare HMO | Source: Ambulatory Visit | Attending: Radiation Oncology | Admitting: Radiation Oncology

## 2019-09-17 DIAGNOSIS — Z510 Encounter for antineoplastic radiation therapy: Secondary | ICD-10-CM | POA: Diagnosis not present

## 2019-09-18 ENCOUNTER — Ambulatory Visit: Payer: Medicare HMO

## 2019-09-19 ENCOUNTER — Telehealth: Payer: Self-pay

## 2019-09-19 ENCOUNTER — Ambulatory Visit: Payer: Medicare HMO

## 2019-09-19 NOTE — Telephone Encounter (Signed)
Notified by Joanell Rising that patient had missed last two radiation treatments. Called both patient and family member listed in chart, but unable to reach either. VM left on both numbers inquiring about patient's status to see if he will come in for treatment Monday 09/22/19. Provided direct call back number, and requested call back with update.   Informed later by Ted Mcalpine Navigator that she was able to speak with patient and he assured her that he would attend his treatments next week.

## 2019-09-22 ENCOUNTER — Encounter: Payer: Self-pay | Admitting: Medical Oncology

## 2019-09-22 ENCOUNTER — Ambulatory Visit: Payer: Medicare HMO

## 2019-09-22 NOTE — Progress Notes (Signed)
Spoke with patient to inquire about missed radiation treatment today. He states he is sick and did not feel well enough to come. He states he does well with radiation Mon- Wed, but on Thursday and Friday he develops  nausea, vomiting and diarrhea. Usually after resting the weekend, the symptoms improve but this morning he vomited x 3. He has not taken any imodium but will get some. He is drinking to stay hydrated. I will notify Ashlyn, PA/ Sam and follow up with him. He voiced understanding and  appreciation for the call.

## 2019-09-23 ENCOUNTER — Telehealth: Payer: Self-pay | Admitting: Radiation Oncology

## 2019-09-23 ENCOUNTER — Ambulatory Visit: Payer: Medicare HMO

## 2019-09-23 NOTE — Telephone Encounter (Signed)
Alerted that patient did not present for radiation treatment again today. Received following inbasket message from [employer], PA-C. Phoned patient to inquire. Patient states, "I feel much better today after taking the Imodium." Patient reports being able to keep foods down today. Patient confirms his intent to present for treatment tomorrow. Patient denies need for Zofran at this time. Informed treatment team to include L3 of these findings.     I do not think the N/V is related to radiation but may very well be associated with anxiety. I would recommend that he see his PCP to discuss an anti-anxiety medication temporarily to help get him through the radiation treatments. I agree with using Imodium prn diarrhea and I am happy to send a Rx for zofran to help with the nausea if he would like. Just let me know.  Lia Foyer

## 2019-09-23 NOTE — Telephone Encounter (Signed)
-----   Message from [place], [place] sent at 09/23/2019  2:31 PM EDT ----- Regarding: RE: side effects I do not think the N/V is related to radiation but may very well be associated with anxiety. I would recommend that he see his PCP to discuss an anti-anxiety medication temporarily to help get him through the radiation treatments. I agree with using Imodium prn diarrhea and I am happy to send a Rx for zofran to help with the nausea if he would like. Just let me know. Lia Foyer ----- Message ----- From: Gwenette Greet, RN Sent: 09/22/2019   3:14 PM EDT To: Freeman Caldron, PA-C Subject: side effects                                   Spoke with patient to inquire about missed radiation treatment today. He states he is sick and did not feel well enough to come. He states he does well with radiation Mon- Wed, but on Thursday and Friday he develops  nausea, vomiting and diarrhea. Usually after resting the weekend, the symptoms improve but this morning he vomited x 3. He has not taken any imodium but will get some. He is drinking to stay hydrated. I will notify Ashlyn, PA/ Sam and follow up with him. He voiced understanding and  appreciation for the call.   Do you think this is related to radiation?   RB

## 2019-09-24 ENCOUNTER — Ambulatory Visit
Admission: RE | Admit: 2019-09-24 | Discharge: 2019-09-24 | Disposition: A | Discharge status: Home / Self Care | Payer: Medicare HMO | Source: Ambulatory Visit | Attending: Radiation Oncology | Admitting: Radiation Oncology

## 2019-09-24 ENCOUNTER — Other Ambulatory Visit: Payer: Self-pay

## 2019-09-24 DIAGNOSIS — Z510 Encounter for antineoplastic radiation therapy: Secondary | ICD-10-CM | POA: Diagnosis not present

## 2019-09-25 ENCOUNTER — Ambulatory Visit
Admission: RE | Admit: 2019-09-25 | Discharge: 2019-09-25 | Disposition: A | Discharge status: Home / Self Care | Payer: Medicare HMO | Source: Ambulatory Visit | Attending: Radiation Oncology | Admitting: Radiation Oncology

## 2019-09-25 ENCOUNTER — Other Ambulatory Visit: Payer: Self-pay

## 2019-09-25 DIAGNOSIS — Z510 Encounter for antineoplastic radiation therapy: Secondary | ICD-10-CM | POA: Diagnosis not present

## 2019-09-26 ENCOUNTER — Ambulatory Visit: Payer: Medicare HMO

## 2019-09-26 ENCOUNTER — Other Ambulatory Visit: Payer: Self-pay

## 2019-09-26 ENCOUNTER — Ambulatory Visit
Admission: RE | Admit: 2019-09-26 | Discharge: 2019-09-26 | Disposition: A | Discharge status: Home / Self Care | Payer: Medicare HMO | Source: Ambulatory Visit | Attending: Radiation Oncology | Admitting: Radiation Oncology

## 2019-09-26 DIAGNOSIS — Z510 Encounter for antineoplastic radiation therapy: Secondary | ICD-10-CM | POA: Diagnosis not present

## 2019-09-29 ENCOUNTER — Ambulatory Visit: Payer: Medicare HMO

## 2019-09-29 ENCOUNTER — Ambulatory Visit
Admission: RE | Admit: 2019-09-29 | Discharge: 2019-09-29 | Disposition: A | Discharge status: Home / Self Care | Payer: Medicare HMO | Source: Ambulatory Visit | Attending: Radiation Oncology | Admitting: Radiation Oncology

## 2019-09-29 ENCOUNTER — Other Ambulatory Visit: Payer: Self-pay

## 2019-09-29 DIAGNOSIS — Z510 Encounter for antineoplastic radiation therapy: Secondary | ICD-10-CM | POA: Diagnosis not present

## 2019-09-30 ENCOUNTER — Ambulatory Visit
Admission: RE | Admit: 2019-09-30 | Discharge: 2019-09-30 | Disposition: A | Discharge status: Home / Self Care | Payer: Medicare HMO | Source: Ambulatory Visit | Attending: Radiation Oncology | Admitting: Radiation Oncology

## 2019-09-30 ENCOUNTER — Ambulatory Visit: Payer: Medicare HMO

## 2019-09-30 DIAGNOSIS — Z510 Encounter for antineoplastic radiation therapy: Secondary | ICD-10-CM | POA: Diagnosis not present

## 2019-10-01 ENCOUNTER — Ambulatory Visit: Payer: Medicare HMO

## 2019-10-01 ENCOUNTER — Ambulatory Visit
Admission: RE | Admit: 2019-10-01 | Discharge: 2019-10-01 | Disposition: A | Discharge status: Home / Self Care | Payer: Medicare HMO | Source: Ambulatory Visit | Attending: Radiation Oncology | Admitting: Radiation Oncology

## 2019-10-01 DIAGNOSIS — Z510 Encounter for antineoplastic radiation therapy: Secondary | ICD-10-CM | POA: Diagnosis not present

## 2019-10-02 ENCOUNTER — Ambulatory Visit: Payer: Medicare HMO

## 2019-10-02 ENCOUNTER — Ambulatory Visit
Admission: RE | Admit: 2019-10-02 | Discharge: 2019-10-02 | Disposition: A | Discharge status: Home / Self Care | Payer: Medicare HMO | Source: Ambulatory Visit | Attending: Radiation Oncology | Admitting: Radiation Oncology

## 2019-10-02 DIAGNOSIS — Z510 Encounter for antineoplastic radiation therapy: Secondary | ICD-10-CM | POA: Diagnosis not present

## 2019-10-03 ENCOUNTER — Ambulatory Visit
Admission: RE | Admit: 2019-10-03 | Discharge: 2019-10-03 | Disposition: A | Discharge status: Home / Self Care | Payer: Medicare HMO | Source: Ambulatory Visit | Attending: Radiation Oncology | Admitting: Radiation Oncology

## 2019-10-03 ENCOUNTER — Ambulatory Visit: Payer: Medicare HMO

## 2019-10-03 DIAGNOSIS — C61 Malignant neoplasm of prostate: Secondary | ICD-10-CM | POA: Diagnosis present

## 2019-10-03 DIAGNOSIS — Z510 Encounter for antineoplastic radiation therapy: Secondary | ICD-10-CM | POA: Diagnosis not present

## 2019-10-06 ENCOUNTER — Other Ambulatory Visit: Payer: Self-pay

## 2019-10-06 ENCOUNTER — Ambulatory Visit
Admission: RE | Admit: 2019-10-06 | Discharge: 2019-10-06 | Disposition: A | Discharge status: Home / Self Care | Payer: Medicare HMO | Source: Ambulatory Visit | Attending: Radiation Oncology | Admitting: Radiation Oncology

## 2019-10-06 DIAGNOSIS — Z510 Encounter for antineoplastic radiation therapy: Secondary | ICD-10-CM | POA: Diagnosis not present

## 2019-10-07 ENCOUNTER — Other Ambulatory Visit: Payer: Self-pay

## 2019-10-07 ENCOUNTER — Ambulatory Visit
Admission: RE | Admit: 2019-10-07 | Discharge: 2019-10-07 | Disposition: A | Discharge status: Home / Self Care | Payer: Medicare HMO | Source: Ambulatory Visit | Attending: Radiation Oncology | Admitting: Radiation Oncology

## 2019-10-07 ENCOUNTER — Encounter: Payer: Self-pay | Admitting: Urology

## 2019-10-07 DIAGNOSIS — Z510 Encounter for antineoplastic radiation therapy: Secondary | ICD-10-CM | POA: Diagnosis not present

## 2019-10-08 ENCOUNTER — Encounter: Payer: Self-pay | Admitting: Medical Oncology

## 2019-11-05 ENCOUNTER — Ambulatory Visit
Admission: RE | Admit: 2019-11-05 | Discharge: 2019-11-05 | Disposition: A | Discharge status: Home / Self Care | Payer: Medicare HMO | Source: Ambulatory Visit | Attending: Urology | Admitting: Urology

## 2019-11-05 ENCOUNTER — Encounter: Payer: Self-pay | Admitting: Urology

## 2019-11-05 ENCOUNTER — Other Ambulatory Visit: Payer: Self-pay

## 2019-11-05 DIAGNOSIS — C61 Malignant neoplasm of prostate: Secondary | ICD-10-CM

## 2019-11-05 NOTE — Progress Notes (Signed)
  Radiation Oncology         [phone]) [phone] ________________________________  Name: Terry Page. MRN: [id number]  Date: 10/07/2019  DOB: [birth year]  End of Treatment Note  Diagnosis:   65 y.o. gentleman with Stage T1c adenocarcinoma of the prostate with Gleason score of 3+4, and PSA of 18.1     Indication for treatment:  Curative, Definitive Radiotherapy       Radiation treatment dates:   08/19/19 - 10/07/19  Site/dose:   The prostate was treated to 70 Gy in 28 fractions of 2.5 Gy  Beams/energy:   The patient was treated with IMRT using volumetric arc therapy delivering 6 MV X-rays to clockwise and counterclockwise circumferential arcs with a 90 degree collimator offset to avoid dose scalloping.  Image guidance was performed with daily cone beam CT prior to each fraction to align to gold markers in the prostate and assure proper bladder and rectal fill volumes.  Immobilization was achieved with BodyFix custom mold.  Narrative: The patient tolerated radiation treatment relatively well with only minor urinary irritation and modest fatigue.  He did have some increased anxiety that led to N/V towards the end of the first few weeks of treatment and would improve with rest over the weekend. He also experienced occasional diarrhea that was managed with Imodium prn. Otherwise, he denied LUTS- specifically denied gross hematuria, dysuria, frequency, hesitancy, weak stream, incomplete emptying or incontinence.  Plan: The patient has completed radiation treatment. He will return to radiation oncology clinic for routine followup in one month. I advised him to call or return sooner if he has any questions or concerns related to his recovery or treatment. ________________________________  Sheral Apley. Tammi Klippel, M.D.

## 2019-11-05 NOTE — Progress Notes (Signed)
Radiation Oncology         [phone]) [phone] ________________________________  Name: Terry Page. MRN: [id number]  Date: 11/05/2019  DOB: [birth year]  Post Treatment Note  CC: [name], MD  [name], MD  Diagnosis:   64 y.o. gentleman with Stage T1c adenocarcinoma of the prostate with Gleason score of 3+4, and PSA of 18.1  Interval Since Last Radiation:  4 weeks  08/19/19 - 10/07/19:   The prostate was treated to 70 Gy in 28 fractions of 2.5 Gy  Narrative:  I spoke with the patient to conduct his routine scheduled 1 month follow up visit via telephone to spare the patient unnecessary potential exposure in the healthcare setting during the current COVID-19 pandemic.  The patient was notified in advance and gave permission to proceed with this visit format.   He tolerated radiation treatment relatively well with only minor urinary irritation and modest fatigue.  He did have some increased anxiety that led to N/V towards the end of the first few weeks of treatment and would improve with rest over the weekend. He also experienced occasional diarrhea that was managed with Imodium prn. Otherwise, he denied LUTS- specifically denied gross hematuria, dysuria, frequency, hesitancy, weak stream, incomplete emptying or incontinence.                              On review of systems, the patient states the patient reports that he is doing very well in general. He currently denies any LUTS and reports that the loose stool/diarrhea has completely resolved. His current IPSS score is 1, indicating minimal urinary symptoms. He denies abdominal pain, nausea, vomiting, diarrhea or constipation. He reports a healthy appetite and is maintaining his weight. He denies any significant fatigue. He feels like he is back to his baseline at this point and is quite pleased with his progress to date.  ALLERGIES:  has No Known Allergies.  Meds: Current Outpatient Medications  Medication Sig Dispense Refill  .  albuterol (VENTOLIN HFA) 108 (90 Base) MCG/ACT inhaler Inhale 2 puffs into the lungs every 6 (six) hours as needed for wheezing or shortness of breath. 8 g 1  . amLODipine (NORVASC) 10 MG tablet Take 1 tablet (10 mg total) by mouth daily. 30 tablet 0  . aspirin EC 81 MG tablet Take 81 mg by mouth daily.    [name] atorvastatin (LIPITOR) 20 MG tablet Take 20 mg by mouth daily.    [name] docusate sodium (COLACE) 100 MG capsule Take 100 mg by mouth daily.     [name] olmesartan (BENICAR) 40 MG tablet Take 40 mg by mouth daily.  1   No current facility-administered medications for this encounter.    Physical Findings:  vitals were not taken for this visit.   /Unable to assess due to telephone follow up visit format. Lab Findings: Lab Results  Component Value Date   WBC 11.6 (H) 06/06/2019   HGB 12.8 (L) 06/06/2019   HCT 39.1 06/06/2019   MCV 92.7 06/06/2019   PLT 295 06/06/2019     Radiographic Findings: No results found.  Impression/Plan: 56. 64 y.o. gentleman with Stage T1c adenocarcinoma of the prostate with Gleason score of 3+4, and PSA of 18.1 He will continue to follow up with urology for ongoing PSA determinations but does not currently have a follow up appointment scheduled with Dr. Gloriann Loan to his knowledge but expects a call from Dr. Purvis Sheffield office in the next  few weeks to schedule his follow-up visit for sometime in January 2022. He understands what to expect with regards to PSA monitoring going forward. I will look forward to following his response to treatment via correspondence with urology, and would be happy to continue to participate in his care if clinically indicated. I talked to the patient about what to expect in the future, including his risk for erectile dysfunction and rectal bleeding. I encouraged him to call or return to the office if he has any questions regarding his previous radiation or possible radiation side effects. He was comfortable with this plan and will follow up as  needed.    Nicholos Johns, PA-C

## 2019-11-05 NOTE — Progress Notes (Signed)
Patient states that he does not remember when his last appointment was with his urologist. Denies any hematuria or dysuria. States that he empties his bladder with urination. Reports a strong stream.Denies any leakage or urgency.Meaningful use is complete.

## 2019-12-15 ENCOUNTER — Telehealth: Payer: Self-pay | Admitting: *Deleted

## 2019-12-15 NOTE — Telephone Encounter (Signed)
CALLED PATIENT TO ALTER FU ON 12-22-19 DUE TO DR. KINARD BEING ON VACATION, RESCHEDULED FOR 01-12-20, PATIENT AGREED TO NEW DAY AND TIME

## 2019-12-22 ENCOUNTER — Ambulatory Visit: Payer: Self-pay | Admitting: Radiation Oncology

## 2020-01-07 ENCOUNTER — Encounter (HOSPITAL_COMMUNITY): Payer: Self-pay

## 2020-01-07 ENCOUNTER — Ambulatory Visit (HOSPITAL_COMMUNITY)
Admission: RE | Admit: 2020-01-07 | Discharge: 2020-01-07 | Disposition: A | Discharge status: Home / Self Care | Payer: Medicare HMO | Source: Ambulatory Visit | Attending: Radiation Oncology | Admitting: Radiation Oncology

## 2020-01-07 ENCOUNTER — Other Ambulatory Visit: Payer: Self-pay

## 2020-01-07 DIAGNOSIS — R911 Solitary pulmonary nodule: Secondary | ICD-10-CM | POA: Diagnosis present

## 2020-01-07 LAB — POCT I-STAT CREATININE: Creatinine, Ser: 0.9 mg/dL (ref 0.61–1.24)

## 2020-01-07 MED ORDER — IOHEXOL 300 MG/ML  SOLN
75.0000 mL | Freq: Once | INTRAMUSCULAR | Status: AC | PRN
  Administered 2020-01-07: 75 mL via INTRAVENOUS

## 2020-01-12 ENCOUNTER — Ambulatory Visit
Admission: RE | Admit: 2020-01-12 | Discharge: 2020-01-12 | Disposition: A | Discharge status: Home / Self Care | Payer: Medicare HMO | Source: Ambulatory Visit | Attending: Radiation Oncology | Admitting: Radiation Oncology

## 2020-01-12 ENCOUNTER — Telehealth: Payer: Self-pay | Admitting: *Deleted

## 2020-01-12 NOTE — Progress Notes (Incomplete)
Radiation Oncology         [phone]) [phone] ________________________________  Name: Terry Page. MRN: [id number]  Date: 01/12/2020  DOB: 01/29/1955  Follow-Up Visit Note  CC: [name], MD  [name], MD  No diagnosis found.  Diagnosis: PET positive solitary nodule presenting in the left upper lobe   Interval Since Last Radiation: One year and nine months  04/03/2018, 04/10/2018, 04/12/2018: LUL / 54 Gy in 3 fractions (SBRT)  Narrative: The patient returns today for routine follow-up. Since his last visit, he underwent radiation treatment (curative, definitive radiotherapy; IMRT using volumetric arc therapy delivering 6 MV X-rays to clockwise and counterclockwise circumferential arcs with a 90 degree collimator offset to avoid dose scalloping; 70 Gy in 28 fractions of 2.5 Gy) from 08/19/2019 - 10/07/2019 for T1c adenocarcinoma of the prostate. He tolerated treatment relatively well with only minor urinary irritation and modest fatigue.  Chest CT scan on 01/07/2020 showed interval enlargement of a spiculated nodule of the central left upper lobe that measured 1.2 x 1.0 cm, previously 0.9 x 0.6 cm, highly suspicious for primary lung malignancy. Bandlike post-radiation band scarring of the perihilar left lung was unchanged.  On review of systems, he reports ***. He denies ***.  ALLERGIES:  has No Known Allergies.  Meds: Current Outpatient Medications  Medication Sig Dispense Refill  . albuterol (VENTOLIN HFA) 108 (90 Base) MCG/ACT inhaler Inhale 2 puffs into the lungs every 6 (six) hours as needed for wheezing or shortness of breath. 8 g 1  . amLODipine (NORVASC) 10 MG tablet Take 1 tablet (10 mg total) by mouth daily. 30 tablet 0  . amLODipine (NORVASC) 10 MG tablet Take 10 mg by mouth daily.    [name] aspirin EC 81 MG tablet Take 81 mg by mouth daily.    [name] atorvastatin (LIPITOR) 20 MG tablet Take 20 mg by mouth daily.    [name] docusate sodium (COLACE) 100 MG capsule Take 100 mg  by mouth daily.     [name] olmesartan (BENICAR) 40 MG tablet Take 40 mg by mouth daily.  1   No current facility-administered medications for this encounter.    Physical Findings: The patient is in no acute distress. Patient is alert and oriented.  vitals were not taken for this visit.  Lungs are clear to auscultation bilaterally. Heart has regular rate and rhythm. No palpable cervical, supraclavicular, or axillary adenopathy. Abdomen soft, non-tender, normal bowel sounds. ***  Lab Findings: Lab Results  Component Value Date   WBC 11.6 (H) 06/06/2019   HGB 12.8 (L) 06/06/2019   HCT 39.1 06/06/2019   MCV 92.7 06/06/2019   PLT 295 06/06/2019    Radiographic Findings: CT Chest W Contrast  Result Date: 01/07/2020 CLINICAL DATA:  Non-small cell lung cancer, assess treatment response status post XRT EXAM: CT CHEST WITH CONTRAST TECHNIQUE: Multidetector CT imaging of the chest was performed during intravenous contrast administration. CONTRAST:  13mL OMNIPAQUE IOHEXOL 300 MG/ML  SOLN COMPARISON:  06/17/2019 FINDINGS: Cardiovascular: Aortic atherosclerosis. Normal heart size. Three-vessel coronary artery calcifications. No pericardial effusion. Mediastinum/Nodes: No enlarged mediastinal, hilar, or axillary lymph nodes. Thyroid gland, trachea, and esophagus demonstrate no significant findings. Lungs/Pleura: Severe, bullous centrilobular emphysema. Interval enlargement of a spiculated nodule of the central left upper lobe, measuring 1.2 x 1.0 cm, previously 0.9 x 0.6 cm (series 7, image 48). Stable, benign subpleural nodule or consolidation of the posterior right upper lobe abutting the major fissure, measuring 9 mm (series 7, image 59). Unchanged bandlike scarring  of the perihilar left lung (series 7, image 74). No pleural effusion or pneumothorax. Upper Abdomen: No acute abnormality. Hepatic steatosis. Atrophic splenic remnant in the left upper quadrant. Musculoskeletal: No chest wall mass or suspicious bone  lesions identified. IMPRESSION: 1. Interval enlargement of a spiculated nodule of the central left upper lobe, measuring 1.2 x 1.0 cm, previously 0.9 x 0.6 cm, highly suspicious for primary lung malignancy. 2. Unchanged bandlike post radiation scarring of the perihilar left lung. 3. Severe, bullous emphysema. 4. Coronary artery disease. 5. Hepatic steatosis. Aortic Atherosclerosis (ICD10-I70.0) and Emphysema (ICD10-J43.9). Electronically Signed   By: Eddie Candle M.D.   On: 01/07/2020 10:14    Impression:  PET positive solitary nodule presenting in the left upper lobe, s/p stereotactic body radiation therapy.    Recent chest CT scan shows interval enlargement of a spiculated nodule of the central left upper lobe that measured 1.2 x 1.0 cm, previously 0.9 x 0.6 cm, highly suspicious for primary lung malignancy  Plan: ***  Total time spent in this encounter was *** minutes which included reviewing the patient's most recent chest CT scan, radiation therapy, follow-ups, physical examination, and documentation.  ____________________________________   Blair Promise, PhD, MD  This document serves as a record of services personally performed by Gery Pray, MD. It was created on his behalf by Clerance Lav, a trained medical scribe. The creation of this record is based on the scribe's personal observations and the provider's statements to them. This document has been checked and approved by the attending provider.

## 2020-01-12 NOTE — Telephone Encounter (Signed)
CALLED PATIENT TO ASK ABOUT RESCHEDULING MISSED FU ON 01/12/20, PATIENT AGREED TO COME ON 01/22/20 @ 9:45 AM

## 2020-01-22 ENCOUNTER — Ambulatory Visit
Admission: RE | Admit: 2020-01-22 | Discharge: 2020-01-22 | Disposition: A | Discharge status: Home / Self Care | Payer: Medicare HMO | Source: Ambulatory Visit | Attending: Radiation Oncology | Admitting: Radiation Oncology

## 2020-01-22 ENCOUNTER — Encounter: Payer: Self-pay | Admitting: Radiation Oncology

## 2020-01-22 ENCOUNTER — Other Ambulatory Visit: Payer: Self-pay

## 2020-01-22 DIAGNOSIS — J439 Emphysema, unspecified: Secondary | ICD-10-CM | POA: Insufficient documentation

## 2020-01-22 DIAGNOSIS — I2510 Atherosclerotic heart disease of native coronary artery without angina pectoris: Secondary | ICD-10-CM | POA: Insufficient documentation

## 2020-01-22 DIAGNOSIS — K760 Fatty (change of) liver, not elsewhere classified: Secondary | ICD-10-CM | POA: Diagnosis not present

## 2020-01-22 DIAGNOSIS — Z7982 Long term (current) use of aspirin: Secondary | ICD-10-CM | POA: Insufficient documentation

## 2020-01-22 DIAGNOSIS — R911 Solitary pulmonary nodule: Secondary | ICD-10-CM | POA: Diagnosis not present

## 2020-01-22 DIAGNOSIS — Z79899 Other long term (current) drug therapy: Secondary | ICD-10-CM | POA: Diagnosis not present

## 2020-01-22 DIAGNOSIS — J984 Other disorders of lung: Secondary | ICD-10-CM | POA: Diagnosis not present

## 2020-01-22 DIAGNOSIS — Z923 Personal history of irradiation: Secondary | ICD-10-CM | POA: Diagnosis not present

## 2020-01-22 DIAGNOSIS — C61 Malignant neoplasm of prostate: Secondary | ICD-10-CM | POA: Diagnosis present

## 2020-01-22 NOTE — Progress Notes (Signed)
Patient completed radiation to lung in 2020 and prostate radiation in 2021. Patient denies having any pain today.  Patient reports some shortness of breath with exertion.  Patient denies having a cough or difficulty swallowing.    Patient denies issues with incomplete emptying, increased frequency, nocturia, dysuria. Patient denies diarrhea or constipation.  Appetite is good.  Reports energy is sufficient.  Vitals:   01/22/20 1001  BP: (!) 133/99  Pulse: 96  Resp: 20  Temp: 97.7 F (36.5 C)  SpO2: 100%  Weight: 172 lb 6.4 oz (78.2 kg)  Height: 5\' 9"  (1.753 m)

## 2020-01-22 NOTE — Progress Notes (Signed)
Radiation Oncology         [phone]) [phone] ________________________________  Name: Terry Page. MRN: [id number]  Date: 01/22/2020  DOB: December 15, 1955  Follow-Up Visit Note  CC: Terry Macadam, MD  Terry Macadam, MD    ICD-10-CM   1. Incidental lung nodule, greater than or equal to 52mm  R91.1 NM PET Image Restag (PS) Skull Base To Thigh  2. Malignant neoplasm of prostate ([name])  C61     Diagnosis: PET positive solitary nodule presenting in the left upper lobe   Interval Since Last Radiation: One year, nine months, one week, and three days.  04/03/2018, 04/10/2018, 04/12/2018: LUL / 54 Gy in 3 fractions (SBRT)  Narrative: The patient returns today for routine follow-up. Since his last visit, he underwent radiation treatment (curative, definitive radiotherapy; IMRT using volumetric arc therapy delivering 6 MV X-rays to clockwise and counterclockwise circumferential arcs with a 90 degree collimator offset to avoid dose scalloping; 70 Gy in 28 fractions of 2.5 Gy) from 08/19/2019 - 10/07/2019 for T1c adenocarcinoma of the prostate. He tolerated treatment relatively well with only minor urinary irritation and modest fatigue.  He will follow-up long-term with Dr. Gloriann Loan concerning his prostate cancer.  Chest CT scan on 01/07/2020 showed interval enlargement of a spiculated nodule of the central left upper lobe that measured 1.2 x 1.0 cm, previously 0.9 x 0.6 cm, highly suspicious for primary lung malignancy. Bandlike post-radiation band scarring of the perihilar left lung was unchanged.  On review of systems, he reports feeling well with a good appetite. He denies pain within the chest area significant cough or hemoptysis.[name]  ALLERGIES:  has No Known Allergies.  Meds: Current Outpatient Medications  Medication Sig Dispense Refill  . albuterol (VENTOLIN HFA) 108 (90 Base) MCG/ACT inhaler Inhale 2 puffs into the lungs every 6 (six) hours as needed for wheezing or shortness of breath. 8 g 1  .  amLODipine (NORVASC) 10 MG tablet Take 10 mg by mouth daily.    [name] aspirin EC 81 MG tablet Take 81 mg by mouth daily.    [name] atorvastatin (LIPITOR) 20 MG tablet Take 20 mg by mouth daily.    [name] docusate sodium (COLACE) 100 MG capsule Take 100 mg by mouth daily.     [name] olmesartan (BENICAR) 40 MG tablet Take 40 mg by mouth daily.  1   No current facility-administered medications for this encounter.    Physical Findings: The patient is in no acute distress. Patient is alert and oriented.  height is 5\' 9"  (1.753 m) and weight is 172 lb 6.4 oz (78.2 kg). His temperature is 97.7 F (36.5 C). His blood pressure is 133/99 (abnormal) and his pulse is 96. His respiration is 20 and oxygen saturation is 100%.  Lungs are clear to auscultation bilaterally. Heart has regular rate and rhythm. No palpable cervical, supraclavicular, or axillary adenopathy. Abdomen soft, non-tender, normal bowel sounds.  Lab Findings: Lab Results  Component Value Date   WBC 11.6 (H) 06/06/2019   HGB 12.8 (L) 06/06/2019   HCT 39.1 06/06/2019   MCV 92.7 06/06/2019   PLT 295 06/06/2019    Radiographic Findings: CT Chest W Contrast  Result Date: 01/07/2020 CLINICAL DATA:  Non-small cell lung cancer, assess treatment response status post XRT EXAM: CT CHEST WITH CONTRAST TECHNIQUE: Multidetector CT imaging of the chest was performed during intravenous contrast administration. CONTRAST:  32mL OMNIPAQUE IOHEXOL 300 MG/ML  SOLN COMPARISON:  06/17/2019 FINDINGS: Cardiovascular: Aortic atherosclerosis. Normal heart size. Three-vessel coronary artery  calcifications. No pericardial effusion. Mediastinum/Nodes: No enlarged mediastinal, hilar, or axillary lymph nodes. Thyroid gland, trachea, and esophagus demonstrate no significant findings. Lungs/Pleura: Severe, bullous centrilobular emphysema. Interval enlargement of a spiculated nodule of the central left upper lobe, measuring 1.2 x 1.0 cm, previously 0.9 x 0.6 cm (series 7, image 48). Stable,  benign subpleural nodule or consolidation of the posterior right upper lobe abutting the major fissure, measuring 9 mm (series 7, image 59). Unchanged bandlike scarring of the perihilar left lung (series 7, image 74). No pleural effusion or pneumothorax. Upper Abdomen: No acute abnormality. Hepatic steatosis. Atrophic splenic remnant in the left upper quadrant. Musculoskeletal: No chest wall mass or suspicious bone lesions identified. IMPRESSION: 1. Interval enlargement of a spiculated nodule of the central left upper lobe, measuring 1.2 x 1.0 cm, previously 0.9 x 0.6 cm, highly suspicious for primary lung malignancy. 2. Unchanged bandlike post radiation scarring of the perihilar left lung. 3. Severe, bullous emphysema. 4. Coronary artery disease. 5. Hepatic steatosis. Aortic Atherosclerosis (ICD10-I70.0) and Emphysema (ICD10-J43.9). Electronically Signed   By: Terry Page M.D.   On: 01/07/2020 10:14    Impression:  PET positive solitary nodule presenting in the left upper lobe, s/p stereotactic body radiation therapy.    Recent chest CT scan shows interval enlargement of a spiculated nodule of the central left upper lobe that measured 1.2 x 1.0 cm, previously 0.9 x 0.6 cm, highly suspicious for primary lung malignancy.  I have carefully reviewed the patient's most recent chest CT scan.  This area of concern is separate from his previous treatment area and is concerning for a new clinical stage I non-small cell lung cancer.  We discussed these findings in detail and discussed options for management including proceeding with a PET scan or consideration for biopsy.  The patient would prefer to proceed with PET scan for further evaluation.  If this lesion is PET avid and no other lesions are found he would be a good candidate for an additional course of SBRT directed at this solitary pulmonary nodule.  If PET scan is positive he does not wish to consider biopsy to confirm.  I discussed the course of additional  SBRT in detail with the patient with anticipated side effects and potential complications.  If CT PET scan confirms solitary lesion he does wish to proceed with additional SBRT.  Plan: A pet scan has been ordered.  The patient will proceed with SBRT if this confirms malignant activity in the spiculated nodule within the central left upper lobe.  Total time spent in this encounter was 35 minutes which included reviewing the patient's most recent chest CT scan, radiation therapy, follow-ups, physical examination, and documentation.  ____________________________________   Blair Promise, PhD, MD  This document serves as a record of services personally performed by Gery Pray, MD. It was created on his behalf by Clerance Lav, a trained medical scribe. The creation of this record is based on the scribe's personal observations and the provider's statements to them. This document has been checked and approved by the attending provider.

## 2020-02-06 ENCOUNTER — Other Ambulatory Visit: Payer: Self-pay

## 2020-02-06 ENCOUNTER — Ambulatory Visit (HOSPITAL_COMMUNITY)
Admission: RE | Admit: 2020-02-06 | Discharge: 2020-02-06 | Disposition: A | Discharge status: Home / Self Care | Payer: Medicare HMO | Source: Ambulatory Visit | Attending: Radiation Oncology | Admitting: Radiation Oncology

## 2020-02-06 DIAGNOSIS — K760 Fatty (change of) liver, not elsewhere classified: Secondary | ICD-10-CM | POA: Diagnosis not present

## 2020-02-06 DIAGNOSIS — J439 Emphysema, unspecified: Secondary | ICD-10-CM | POA: Insufficient documentation

## 2020-02-06 DIAGNOSIS — I2510 Atherosclerotic heart disease of native coronary artery without angina pectoris: Secondary | ICD-10-CM | POA: Insufficient documentation

## 2020-02-06 DIAGNOSIS — R911 Solitary pulmonary nodule: Secondary | ICD-10-CM | POA: Insufficient documentation

## 2020-02-06 DIAGNOSIS — I700 Atherosclerosis of aorta: Secondary | ICD-10-CM | POA: Insufficient documentation

## 2020-02-06 DIAGNOSIS — K5730 Diverticulosis of large intestine without perforation or abscess without bleeding: Secondary | ICD-10-CM | POA: Diagnosis not present

## 2020-02-06 LAB — GLUCOSE, CAPILLARY: Glucose-Capillary: 118 mg/dL — ABNORMAL HIGH (ref 70–99)

## 2020-02-06 MED ORDER — FLUDEOXYGLUCOSE F - 18 (FDG) INJECTION
8.3300 | Freq: Once | INTRAVENOUS | Status: AC | PRN
  Administered 2020-02-06: 8.33 via INTRAVENOUS

## 2020-02-09 ENCOUNTER — Telehealth: Payer: Self-pay | Admitting: *Deleted

## 2020-02-09 ENCOUNTER — Ambulatory Visit
Admission: RE | Admit: 2020-02-09 | Discharge: 2020-02-09 | Disposition: A | Discharge status: Home / Self Care | Payer: Medicare HMO | Source: Ambulatory Visit | Attending: Radiation Oncology | Admitting: Radiation Oncology

## 2020-02-09 NOTE — Telephone Encounter (Signed)
CALLED PATIENT TO RESCHEDULE MISSED FU FOR 02-09-20, LVM FOR A RETURN CALL

## 2020-02-09 NOTE — Progress Notes (Incomplete)
Radiation Oncology         [phone]) [phone] ________________________________  Name: Terry Page. MRN: [id number]  Date: 02/09/2020  DOB: 1955/11/28  Follow-Up Visit Note  CC: [name], MD  [name], MD  No diagnosis found.  Diagnosis: PET positive solitary nodule presenting in the left upper lobe   Interval Since Last Radiation: One year, nine months, and four weeks  04/03/2018, 04/10/2018, 04/12/2018: LUL / 54 Gy in 3 fractions (SBRT)  Narrative: The patient returns today for routine follow-up. Since his last visit, he underwent a PET scan on 02/06/2020 that showed a hypermetabolic, irregular, solid, 1.3 cm left upper lobe pulmonary nodule that was compatible with malignancy, either a metachronous primary bronchogenic carcinoma or ipsilateral metastasis. There was no residual or recurrent hypermetabolism at the site of the treated posterior basilar left lower lobe pulmonary nodule with mild radiation change at that location. There was no hypermetabolic thoracic adenopathy or distant metastatic disease.   On review of systems, he reports ***. He denies ***.  ALLERGIES:  has No Known Allergies.  Meds: Current Outpatient Medications  Medication Sig Dispense Refill  . albuterol (VENTOLIN HFA) 108 (90 Base) MCG/ACT inhaler Inhale 2 puffs into the lungs every 6 (six) hours as needed for wheezing or shortness of breath. 8 g 1  . amLODipine (NORVASC) 10 MG tablet Take 10 mg by mouth daily.    [name] aspirin EC 81 MG tablet Take 81 mg by mouth daily.    [name] atorvastatin (LIPITOR) 20 MG tablet Take 20 mg by mouth daily.    [name] docusate sodium (COLACE) 100 MG capsule Take 100 mg by mouth daily.     [name] olmesartan (BENICAR) 40 MG tablet Take 40 mg by mouth daily.  1   No current facility-administered medications for this encounter.    Physical Findings: The patient is in no acute distress. Patient is alert and oriented.  vitals were not taken for this visit.  Lungs are clear to  auscultation bilaterally. Heart has regular rate and rhythm. No palpable cervical, supraclavicular, or axillary adenopathy. Abdomen soft, non-tender, normal bowel sounds. ***  Lab Findings: Lab Results  Component Value Date   WBC 11.6 (H) 06/06/2019   HGB 12.8 (L) 06/06/2019   HCT 39.1 06/06/2019   MCV 92.7 06/06/2019   PLT 295 06/06/2019    Radiographic Findings: NM PET Image Restag (PS) Skull Base To Thigh  Result Date: 02/06/2020 CLINICAL DATA:  Subsequent treatment strategy for non-small cell left upper lobe lung cancer status post radiation therapy. Enlarging separate left upper lobe nodule on recent chest CT. EXAM: NUCLEAR MEDICINE PET SKULL BASE TO THIGH TECHNIQUE: 8.3 mCi F-18 FDG was injected intravenously. Full-ring PET imaging was performed from the skull base to thigh after the radiotracer. CT data was obtained and used for attenuation correction and anatomic localization. Fasting blood glucose: 118 mg/dl COMPARISON:  01/07/2020 chest CT.  12/21/2017 PET-CT. FINDINGS: Mediastinal blood pool activity: SUV max 2.4 Liver activity: SUV max NA NECK: No hypermetabolic lymph nodes in the neck. Incidental CT findings: none CHEST: Irregular solid 1.3 x 1.1 cm hypermetabolic left upper lobe pulmonary nodule with max SUV 6.6 (series 8/image 22), increased from 0.8 x 0.6 cm on 12/21/2017 PET-CT and increased in density, compatible with malignancy. No focal hypermetabolism at the site of the treated posterior basilar left lower lobe pulmonary nodule with stable sharply marginated bandlike consolidation in this location compatible with radiation change. No additional hypermetabolic pulmonary lesions. No enlarged or hypermetabolic  hilar, mediastinal or axillary nodes. Incidental CT findings: Three-vessel coronary atherosclerosis. Atherosclerotic nonaneurysmal thoracic aorta. Severe centrilobular and paraseptal emphysema. ABDOMEN/PELVIS: No abnormal hypermetabolic activity within the liver, pancreas,  adrenal glands, or spleen. No hypermetabolic lymph nodes in the abdomen or pelvis. Incidental CT findings: Diffuse hepatic steatosis. Simple 1.0 cm left liver cyst. Atrophic spleen. Simple bilateral renal cysts, largest 3.1 cm in the lower right kidney. Atherosclerotic nonaneurysmal abdominal aorta. Mild colonic diverticulosis. SKELETON: No focal hypermetabolic activity to suggest skeletal metastasis. Incidental CT findings: none IMPRESSION: 1. Hypermetabolic irregular solid 1.3 cm left upper lobe pulmonary nodule, compatible with malignancy, either a metachronous primary bronchogenic carcinoma or ipsilateral metastasis. 2. No residual or recurrent hypermetabolism at the site of the treated posterior basilar left lower lobe pulmonary nodule, with mild radiation change at this location. 3. No hypermetabolic thoracic adenopathy or distant metastatic disease. 4. Chronic findings include: Aortic Atherosclerosis (ICD10-I70.0) and Emphysema (ICD10-J43.9). Coronary atherosclerosis. Diffuse hepatic steatosis. Mild colonic diverticulosis. Electronically Signed   By: Ilona Sorrel M.D.   On: 02/06/2020 11:31    Impression:  PET positive solitary nodule presenting in the left upper lobe, s/p stereotactic body radiation therapy.    Recent PET scan showed a hypermetabolic, irregular, solid, 1.3 cm left upper lobe pulmonary nodule that is compatible with malignancy. ***  Plan: ***  Total time spent in this encounter was *** minutes which included reviewing the patient's most recent PET scan, physical examination, and documentation.  ____________________________________   Blair Promise, PhD, MD  This document serves as a record of services personally performed by Gery Pray, MD. It was created on his behalf by Clerance Lav, a trained medical scribe. The creation of this record is based on the scribe's personal observations and the provider's statements to them. This document has been checked and approved by the  attending provider.

## 2020-02-19 ENCOUNTER — Encounter: Payer: Self-pay | Admitting: Radiation Oncology

## 2020-02-19 ENCOUNTER — Other Ambulatory Visit: Payer: Self-pay

## 2020-02-19 ENCOUNTER — Ambulatory Visit
Admission: RE | Admit: 2020-02-19 | Discharge: 2020-02-19 | Disposition: A | Discharge status: Home / Self Care | Payer: Medicare HMO | Source: Ambulatory Visit | Attending: Radiation Oncology | Admitting: Radiation Oncology

## 2020-02-19 DIAGNOSIS — Z7982 Long term (current) use of aspirin: Secondary | ICD-10-CM | POA: Insufficient documentation

## 2020-02-19 DIAGNOSIS — R911 Solitary pulmonary nodule: Secondary | ICD-10-CM | POA: Insufficient documentation

## 2020-02-19 DIAGNOSIS — J439 Emphysema, unspecified: Secondary | ICD-10-CM | POA: Insufficient documentation

## 2020-02-19 DIAGNOSIS — Z923 Personal history of irradiation: Secondary | ICD-10-CM | POA: Diagnosis not present

## 2020-02-19 DIAGNOSIS — K760 Fatty (change of) liver, not elsewhere classified: Secondary | ICD-10-CM | POA: Insufficient documentation

## 2020-02-19 DIAGNOSIS — N281 Cyst of kidney, acquired: Secondary | ICD-10-CM | POA: Insufficient documentation

## 2020-02-19 DIAGNOSIS — K5730 Diverticulosis of large intestine without perforation or abscess without bleeding: Secondary | ICD-10-CM | POA: Insufficient documentation

## 2020-02-19 DIAGNOSIS — Z79899 Other long term (current) drug therapy: Secondary | ICD-10-CM | POA: Diagnosis not present

## 2020-02-19 DIAGNOSIS — I2510 Atherosclerotic heart disease of native coronary artery without angina pectoris: Secondary | ICD-10-CM | POA: Diagnosis not present

## 2020-02-19 DIAGNOSIS — C61 Malignant neoplasm of prostate: Secondary | ICD-10-CM | POA: Diagnosis not present

## 2020-02-19 DIAGNOSIS — K7689 Other specified diseases of liver: Secondary | ICD-10-CM | POA: Insufficient documentation

## 2020-02-19 DIAGNOSIS — I700 Atherosclerosis of aorta: Secondary | ICD-10-CM | POA: Diagnosis not present

## 2020-02-19 NOTE — Progress Notes (Signed)
Radiation Oncology         [phone]) [phone] ________________________________  Name: Terry Page. MRN: [id number]  Date: 02/19/2020  DOB: 07-02-1955  Follow-Up Visit Note  CC: [name], MD  [name], MD    ICD-10-CM   1. Malignant neoplasm of prostate [facility])  C61   2. Incidental lung nodule, greater than or equal to 43mm  R91.1     Diagnosis: PET positive solitary nodule presenting in the left upper lobe   Interval Since Last Radiation: One year, ten months, and one week  04/03/2018, 04/10/2018, 04/12/2018: LUL / 54 Gy in 3 fractions (SBRT)  Narrative: The patient returns today for routine follow-up. Since his last visit, he underwent a PET scan on 02/06/2020 that showed a hypermetabolic, irregular, solid, 1.3 cm left upper lobe pulmonary nodule that was compatible with malignancy, either a metachronous primary bronchogenic carcinoma or ipsilateral metastasis. There was no residual or recurrent hypermetabolism at the site of the treated posterior basilar left lower lobe pulmonary nodule with mild radiation change at that location. There was no hypermetabolic thoracic adenopathy or distant metastatic disease.   On review of systems, he reports no new medical problems since last follow-up. He denies significant cough hemoptysis or pain in the chest area.  He denies any breathing problems.  ALLERGIES:  has No Known Allergies.  Meds: Current Outpatient Medications  Medication Sig Dispense Refill  . amLODipine (NORVASC) 10 MG tablet Take 10 mg by mouth daily.    [name] aspirin EC 81 MG tablet Take 81 mg by mouth daily.    [name] atorvastatin (LIPITOR) 20 MG tablet Take 20 mg by mouth daily.    [name] olmesartan (BENICAR) 40 MG tablet Take 40 mg by mouth daily.  1  . albuterol (VENTOLIN HFA) 108 (90 Base) MCG/ACT inhaler Inhale 2 puffs into the lungs every 6 (six) hours as needed for wheezing or shortness of breath. (Patient not taking: Reported on 02/19/2020) 8 g 1  . docusate sodium  (COLACE) 100 MG capsule Take 100 mg by mouth daily.  (Patient not taking: Reported on 02/19/2020)     No current facility-administered medications for this encounter.    Physical Findings: The patient is in no acute distress. Patient is alert and oriented.  height is 5\' 9"  (1.753 m) and weight is 175 lb 4 oz (79.5 kg). His temporal temperature is 96.9 F (36.1 C) (abnormal). His blood pressure is 139/96 (abnormal) and his pulse is 83. His respiration is 20 and oxygen saturation is 98%.  Lungs are clear to auscultation bilaterally. Heart has regular rate and rhythm. No palpable cervical, supraclavicular, or axillary adenopathy. Abdomen soft, non-tender, normal bowel sounds.   Lab Findings: Lab Results  Component Value Date   WBC 11.6 (H) 06/06/2019   HGB 12.8 (L) 06/06/2019   HCT 39.1 06/06/2019   MCV 92.7 06/06/2019   PLT 295 06/06/2019    Radiographic Findings: NM PET Image Restag (PS) Skull Base To Thigh  Result Date: 02/06/2020 CLINICAL DATA:  Subsequent treatment strategy for non-small cell left upper lobe lung cancer status post radiation therapy. Enlarging separate left upper lobe nodule on recent chest CT. EXAM: NUCLEAR MEDICINE PET SKULL BASE TO THIGH TECHNIQUE: 8.3 mCi F-18 FDG was injected intravenously. Full-ring PET imaging was performed from the skull base to thigh after the radiotracer. CT data was obtained and used for attenuation correction and anatomic localization. Fasting blood glucose: 118 mg/dl COMPARISON:  01/07/2020 chest CT.  12/21/2017 PET-CT. FINDINGS: Mediastinal blood  pool activity: SUV max 2.4 Liver activity: SUV max NA NECK: No hypermetabolic lymph nodes in the neck. Incidental CT findings: none CHEST: Irregular solid 1.3 x 1.1 cm hypermetabolic left upper lobe pulmonary nodule with max SUV 6.6 (series 8/image 22), increased from 0.8 x 0.6 cm on 12/21/2017 PET-CT and increased in density, compatible with malignancy. No focal hypermetabolism at the site of the  treated posterior basilar left lower lobe pulmonary nodule with stable sharply marginated bandlike consolidation in this location compatible with radiation change. No additional hypermetabolic pulmonary lesions. No enlarged or hypermetabolic hilar, mediastinal or axillary nodes. Incidental CT findings: Three-vessel coronary atherosclerosis. Atherosclerotic nonaneurysmal thoracic aorta. Severe centrilobular and paraseptal emphysema. ABDOMEN/PELVIS: No abnormal hypermetabolic activity within the liver, pancreas, adrenal glands, or spleen. No hypermetabolic lymph nodes in the abdomen or pelvis. Incidental CT findings: Diffuse hepatic steatosis. Simple 1.0 cm left liver cyst. Atrophic spleen. Simple bilateral renal cysts, largest 3.1 cm in the lower right kidney. Atherosclerotic nonaneurysmal abdominal aorta. Mild colonic diverticulosis. SKELETON: No focal hypermetabolic activity to suggest skeletal metastasis. Incidental CT findings: none IMPRESSION: 1. Hypermetabolic irregular solid 1.3 cm left upper lobe pulmonary nodule, compatible with malignancy, either a metachronous primary bronchogenic carcinoma or ipsilateral metastasis. 2. No residual or recurrent hypermetabolism at the site of the treated posterior basilar left lower lobe pulmonary nodule, with mild radiation change at this location. 3. No hypermetabolic thoracic adenopathy or distant metastatic disease. 4. Chronic findings include: Aortic Atherosclerosis (ICD10-I70.0) and Emphysema (ICD10-J43.9). Coronary atherosclerosis. Diffuse hepatic steatosis. Mild colonic diverticulosis. Electronically Signed   By: Ilona Sorrel M.D.   On: 02/06/2020 11:31    Impression:  PET positive solitary nodule presenting in the left upper lobe, s/p stereotactic body radiation therapy.    Recent PET scan showed a hypermetabolic, irregular, solid, 1.3 cm left upper lobe pulmonary nodule that is compatible with malignancy.  Today I showed the patient his most recent PET scan  documenting the PET positive nodule in the left upper lobe.  This is in a different area from his previous SBRT treatment.  He would be a candidate for an additional course of SBRT.  I discussed the course of treatment side effects and potential toxicities of radiation therapy in this situation with the patient.  He appears to understand particularly since he is already been through a course of SBRT.  We again discussed potential biopsy but he does not wish to consider this given his overall respiratory status.  Plan: The patient will be scheduled for SBRT simulation with treatments to begin approximately a week later.  Anticipate between 3 and 5 SBRT treatments.  Total time spent in this encounter was 15 minutes which included reviewing the patient's most recent PET scan, physical examination, and documentation.  ____________________________________   Blair Promise, PhD, MD  This document serves as a record of services personally performed by Gery Pray, MD. It was created on his behalf by Clerance Lav, a trained medical scribe. The creation of this record is based on the scribe's personal observations and the provider's statements to them. This document has been checked and approved by the attending provider.

## 2020-02-19 NOTE — Progress Notes (Signed)
Patient is here today for follow up to left lung radiation completed 04/2018.  Patient denies having any pain.  Patient denies shortness of breath/denies cough.  Appetite is good.  Reports energy level is sufficient.  Denies swallowing issues.    Vitals:   02/19/20 0953  BP: (!) 139/96  Pulse: 83  Resp: 20  Temp: (!) 96.9 F (36.1 C)  TempSrc: Temporal  SpO2: 98%  Weight: 175 lb 4 oz (79.5 kg)  Height: 5\' 9"  (1.753 m)

## 2020-02-25 ENCOUNTER — Ambulatory Visit: Payer: Medicare HMO | Admitting: Radiation Oncology

## 2020-03-08 ENCOUNTER — Ambulatory Visit: Payer: Medicare HMO | Admitting: Radiation Oncology

## 2020-03-10 ENCOUNTER — Ambulatory Visit: Payer: Medicare HMO | Admitting: Radiation Oncology

## 2020-03-15 ENCOUNTER — Ambulatory Visit: Payer: Medicare HMO | Admitting: Radiation Oncology

## 2020-03-25 ENCOUNTER — Ambulatory Visit
Admission: RE | Admit: 2020-03-25 | Discharge: 2020-03-25 | Disposition: A | Discharge status: Home / Self Care | Payer: Medicare HMO | Source: Ambulatory Visit | Attending: Radiation Oncology | Admitting: Radiation Oncology

## 2020-03-25 ENCOUNTER — Other Ambulatory Visit: Payer: Self-pay

## 2020-03-25 DIAGNOSIS — R911 Solitary pulmonary nodule: Secondary | ICD-10-CM | POA: Insufficient documentation

## 2020-03-30 DIAGNOSIS — R911 Solitary pulmonary nodule: Secondary | ICD-10-CM | POA: Diagnosis not present

## 2020-04-06 ENCOUNTER — Ambulatory Visit: Payer: Medicare HMO | Admitting: Radiation Oncology

## 2020-04-07 ENCOUNTER — Ambulatory Visit: Payer: Medicare HMO | Admitting: Radiation Oncology

## 2020-04-08 ENCOUNTER — Ambulatory Visit
Admission: RE | Admit: 2020-04-08 | Discharge: 2020-04-08 | Disposition: A | Discharge status: Home / Self Care | Payer: Medicare HMO | Source: Ambulatory Visit | Attending: Radiation Oncology | Admitting: Radiation Oncology

## 2020-04-08 ENCOUNTER — Other Ambulatory Visit: Payer: Self-pay

## 2020-04-08 DIAGNOSIS — R911 Solitary pulmonary nodule: Secondary | ICD-10-CM | POA: Diagnosis not present

## 2020-04-08 DIAGNOSIS — C3412 Malignant neoplasm of upper lobe, left bronchus or lung: Secondary | ICD-10-CM | POA: Diagnosis present

## 2020-04-10 ENCOUNTER — Encounter (HOSPITAL_COMMUNITY): Payer: Self-pay

## 2020-04-10 ENCOUNTER — Inpatient Hospital Stay (HOSPITAL_COMMUNITY)
Admission: EM | Admit: 2020-04-10 | Discharge: 2020-05-10 | DRG: 871 | Disposition: A | Discharge status: Skilled Nursing Facility | Payer: Medicare HMO | Attending: Internal Medicine | Admitting: Internal Medicine

## 2020-04-10 ENCOUNTER — Emergency Department (HOSPITAL_COMMUNITY): Payer: Medicare HMO

## 2020-04-10 ENCOUNTER — Other Ambulatory Visit: Payer: Self-pay

## 2020-04-10 DIAGNOSIS — E782 Mixed hyperlipidemia: Secondary | ICD-10-CM | POA: Diagnosis present

## 2020-04-10 DIAGNOSIS — J850 Gangrene and necrosis of lung: Secondary | ICD-10-CM

## 2020-04-10 DIAGNOSIS — F17200 Nicotine dependence, unspecified, uncomplicated: Secondary | ICD-10-CM | POA: Insufficient documentation

## 2020-04-10 DIAGNOSIS — J439 Emphysema, unspecified: Secondary | ICD-10-CM

## 2020-04-10 DIAGNOSIS — A419 Sepsis, unspecified organism: Secondary | ICD-10-CM | POA: Diagnosis present

## 2020-04-10 DIAGNOSIS — R140 Abdominal distension (gaseous): Secondary | ICD-10-CM

## 2020-04-10 DIAGNOSIS — K567 Ileus, unspecified: Secondary | ICD-10-CM | POA: Diagnosis not present

## 2020-04-10 DIAGNOSIS — Z4659 Encounter for fitting and adjustment of other gastrointestinal appliance and device: Secondary | ICD-10-CM

## 2020-04-10 DIAGNOSIS — Y95 Nosocomial condition: Secondary | ICD-10-CM | POA: Diagnosis present

## 2020-04-10 DIAGNOSIS — K56609 Unspecified intestinal obstruction, unspecified as to partial versus complete obstruction: Secondary | ICD-10-CM | POA: Diagnosis not present

## 2020-04-10 DIAGNOSIS — R6521 Severe sepsis with septic shock: Secondary | ICD-10-CM | POA: Diagnosis present

## 2020-04-10 DIAGNOSIS — J1282 Pneumonia due to coronavirus disease 2019: Principal | ICD-10-CM

## 2020-04-10 DIAGNOSIS — R0600 Dyspnea, unspecified: Secondary | ICD-10-CM

## 2020-04-10 DIAGNOSIS — E222 Syndrome of inappropriate secretion of antidiuretic hormone: Secondary | ICD-10-CM | POA: Diagnosis present

## 2020-04-10 DIAGNOSIS — J441 Chronic obstructive pulmonary disease with (acute) exacerbation: Secondary | ICD-10-CM | POA: Diagnosis not present

## 2020-04-10 DIAGNOSIS — J189 Pneumonia, unspecified organism: Secondary | ICD-10-CM

## 2020-04-10 DIAGNOSIS — J9601 Acute respiratory failure with hypoxia: Secondary | ICD-10-CM | POA: Diagnosis present

## 2020-04-10 DIAGNOSIS — R109 Unspecified abdominal pain: Secondary | ICD-10-CM

## 2020-04-10 DIAGNOSIS — A411 Sepsis due to other specified staphylococcus: Principal | ICD-10-CM | POA: Diagnosis present

## 2020-04-10 DIAGNOSIS — U071 COVID-19: Secondary | ICD-10-CM | POA: Diagnosis not present

## 2020-04-10 DIAGNOSIS — R0602 Shortness of breath: Secondary | ICD-10-CM | POA: Diagnosis not present

## 2020-04-10 DIAGNOSIS — Z7401 Bed confinement status: Secondary | ICD-10-CM

## 2020-04-10 DIAGNOSIS — E785 Hyperlipidemia, unspecified: Secondary | ICD-10-CM | POA: Diagnosis present

## 2020-04-10 DIAGNOSIS — E43 Unspecified severe protein-calorie malnutrition: Secondary | ICD-10-CM | POA: Insufficient documentation

## 2020-04-10 DIAGNOSIS — Z87891 Personal history of nicotine dependence: Secondary | ICD-10-CM

## 2020-04-10 DIAGNOSIS — E876 Hypokalemia: Secondary | ICD-10-CM | POA: Diagnosis not present

## 2020-04-10 DIAGNOSIS — I700 Atherosclerosis of aorta: Secondary | ICD-10-CM | POA: Diagnosis present

## 2020-04-10 DIAGNOSIS — Z79899 Other long term (current) drug therapy: Secondary | ICD-10-CM

## 2020-04-10 DIAGNOSIS — Z6824 Body mass index (BMI) 24.0-24.9, adult: Secondary | ICD-10-CM

## 2020-04-10 DIAGNOSIS — R1310 Dysphagia, unspecified: Secondary | ICD-10-CM | POA: Diagnosis not present

## 2020-04-10 DIAGNOSIS — Z809 Family history of malignant neoplasm, unspecified: Secondary | ICD-10-CM

## 2020-04-10 DIAGNOSIS — J1520 Pneumonia due to staphylococcus, unspecified: Secondary | ICD-10-CM | POA: Diagnosis present

## 2020-04-10 DIAGNOSIS — D638 Anemia in other chronic diseases classified elsewhere: Secondary | ICD-10-CM | POA: Diagnosis present

## 2020-04-10 DIAGNOSIS — J984 Other disorders of lung: Secondary | ICD-10-CM

## 2020-04-10 DIAGNOSIS — C3412 Malignant neoplasm of upper lobe, left bronchus or lung: Secondary | ICD-10-CM | POA: Diagnosis present

## 2020-04-10 DIAGNOSIS — I10 Essential (primary) hypertension: Secondary | ICD-10-CM | POA: Diagnosis present

## 2020-04-10 DIAGNOSIS — Z7982 Long term (current) use of aspirin: Secondary | ICD-10-CM

## 2020-04-10 DIAGNOSIS — G8929 Other chronic pain: Secondary | ICD-10-CM | POA: Diagnosis present

## 2020-04-10 DIAGNOSIS — Z8546 Personal history of malignant neoplasm of prostate: Secondary | ICD-10-CM

## 2020-04-10 DIAGNOSIS — Z923 Personal history of irradiation: Secondary | ICD-10-CM

## 2020-04-10 DIAGNOSIS — M47816 Spondylosis without myelopathy or radiculopathy, lumbar region: Secondary | ICD-10-CM | POA: Diagnosis present

## 2020-04-10 HISTORY — DX: Unspecified asthma, uncomplicated: J45.909

## 2020-04-10 HISTORY — DX: Essential (primary) hypertension: I10

## 2020-04-10 LAB — APTT: aPTT: 44 seconds — ABNORMAL HIGH (ref 24–36)

## 2020-04-10 LAB — COMPREHENSIVE METABOLIC PANEL
ALT: 35 U/L (ref 0–44)
AST: 37 U/L (ref 15–41)
Albumin: 3 g/dL — ABNORMAL LOW (ref 3.5–5.0)
Alkaline Phosphatase: 75 U/L (ref 38–126)
Anion gap: 13 (ref 5–15)
BUN: 19 mg/dL (ref 8–23)
CO2: 22 mmol/L (ref 22–32)
Calcium: 8.8 mg/dL — ABNORMAL LOW (ref 8.9–10.3)
Chloride: 84 mmol/L — ABNORMAL LOW (ref 98–111)
Creatinine, Ser: 0.93 mg/dL (ref 0.61–1.24)
GFR, Estimated: >60 mL/min (ref >60)
Glucose, Bld: 142 mg/dL — ABNORMAL HIGH (ref 70–99)
Potassium: 4 mmol/L (ref 3.5–5.1)
Sodium: 119 mmol/L — CL (ref 135–145)
Total Bilirubin: 1.4 mg/dL — ABNORMAL HIGH (ref 0.3–1.2)
Total Protein: 7.9 g/dL (ref 6.5–8.1)

## 2020-04-10 LAB — URINALYSIS, ROUTINE W REFLEX MICROSCOPIC
Bacteria, UA: NONE SEEN
Bilirubin Urine: NEGATIVE
Glucose, UA: NEGATIVE mg/dL
Ketones, ur: 5 mg/dL — AB
Leukocytes,Ua: NEGATIVE
Nitrite: NEGATIVE
Protein, ur: NEGATIVE mg/dL
Specific Gravity, Urine: 1.031 — ABNORMAL HIGH (ref 1.005–1.030)
pH: 5 (ref 5.0–8.0)

## 2020-04-10 LAB — PROTIME-INR
INR: 1.3 — ABNORMAL HIGH (ref 0.8–1.2)
Prothrombin Time: 15.3 seconds — ABNORMAL HIGH (ref 11.4–15.2)

## 2020-04-10 LAB — D-DIMER, QUANTITATIVE: D-Dimer, Quant: 3.22 ug/mL-FEU — ABNORMAL HIGH (ref 0.00–0.50)

## 2020-04-10 LAB — CBC WITH DIFFERENTIAL/PLATELET
Abs Immature Granulocytes: 1.12 10*3/uL — ABNORMAL HIGH (ref 0.00–0.07)
Basophils Absolute: 0.1 10*3/uL (ref 0.0–0.1)
Basophils Relative: 0 %
Eosinophils Absolute: 0 10*3/uL (ref 0.0–0.5)
Eosinophils Relative: 0 %
HCT: 34.1 % — ABNORMAL LOW (ref 39.0–52.0)
Hemoglobin: 11.7 g/dL — ABNORMAL LOW (ref 13.0–17.0)
Immature Granulocytes: 3 %
Lymphocytes Relative: 2 %
Lymphs Abs: 0.7 10*3/uL (ref 0.7–4.0)
MCH: 30.2 pg (ref 26.0–34.0)
MCHC: 34.3 g/dL (ref 30.0–36.0)
MCV: 88.1 fL (ref 80.0–100.0)
Monocytes Absolute: 2.3 10*3/uL — ABNORMAL HIGH (ref 0.1–1.0)
Monocytes Relative: 6 %
Neutro Abs: 32.1 10*3/uL — ABNORMAL HIGH (ref 1.7–7.7)
Neutrophils Relative %: 89 %
Platelets: 502 10*3/uL — ABNORMAL HIGH (ref 150–400)
RBC: 3.87 MIL/uL — ABNORMAL LOW (ref 4.22–5.81)
RDW: 13.6 % (ref 11.5–15.5)
WBC: 36.3 10*3/uL — ABNORMAL HIGH (ref 4.0–10.5)
nRBC: 0 % (ref 0.0–0.2)

## 2020-04-10 LAB — BASIC METABOLIC PANEL
Anion gap: 14 (ref 5–15)
BUN: 18 mg/dL (ref 8–23)
CO2: 19 mmol/L — ABNORMAL LOW (ref 22–32)
Calcium: 8.6 mg/dL — ABNORMAL LOW (ref 8.9–10.3)
Chloride: 85 mmol/L — ABNORMAL LOW (ref 98–111)
Creatinine, Ser: 0.65 mg/dL (ref 0.61–1.24)
GFR, Estimated: >60 mL/min (ref >60)
Glucose, Bld: 159 mg/dL — ABNORMAL HIGH (ref 70–99)
Potassium: 3.9 mmol/L (ref 3.5–5.1)
Sodium: 118 mmol/L — CL (ref 135–145)

## 2020-04-10 LAB — C-REACTIVE PROTEIN: CRP: 32.4 mg/dL — ABNORMAL HIGH (ref <1.0)

## 2020-04-10 LAB — RESP PANEL BY RT-PCR (FLU A&B, COVID) ARPGX2
Influenza A by PCR: NEGATIVE
Influenza B by PCR: NEGATIVE
SARS Coronavirus 2 by RT PCR: POSITIVE — AB

## 2020-04-10 LAB — LACTIC ACID, PLASMA
Lactic Acid, Venous: 1.6 mmol/L (ref 0.5–1.9)
Lactic Acid, Venous: 1.6 mmol/L (ref 0.5–1.9)

## 2020-04-10 MED ORDER — METHYLPREDNISOLONE SODIUM SUCC 125 MG IJ SOLR
125.0000 mg | Freq: Once | INTRAMUSCULAR | Status: AC
  Administered 2020-04-10: 125 mg via INTRAVENOUS
  Filled 2020-04-10: qty 2

## 2020-04-10 MED ORDER — IRBESARTAN 300 MG PO TABS
300.0000 mg | ORAL_TABLET | Freq: Every day | ORAL | Status: DC
  Administered 2020-04-11 – 2020-05-10 (×29): 300 mg via ORAL
  Filled 2020-04-10 (×29): qty 1

## 2020-04-10 MED ORDER — ZINC SULFATE 220 (50 ZN) MG PO CAPS
220.0000 mg | ORAL_CAPSULE | Freq: Every day | ORAL | Status: DC
  Administered 2020-04-10 – 2020-04-21 (×11): 220 mg via ORAL
  Filled 2020-04-10 (×11): qty 1

## 2020-04-10 MED ORDER — LACTATED RINGERS IV BOLUS (SEPSIS): 1000.0000 mL | Freq: Once | INTRAVENOUS | Status: DC

## 2020-04-10 MED ORDER — ALBUTEROL SULFATE (2.5 MG/3ML) 0.083% IN NEBU
2.5000 mg | INHALATION_SOLUTION | Freq: Once | RESPIRATORY_TRACT | Status: AC
  Administered 2020-04-10: 2.5 mg via RESPIRATORY_TRACT
  Filled 2020-04-10: qty 3

## 2020-04-10 MED ORDER — METHYLPREDNISOLONE SODIUM SUCC 40 MG IJ SOLR
0.5000 mg/kg | Freq: Two times a day (BID) | INTRAMUSCULAR | Status: AC
  Administered 2020-04-11 – 2020-04-13 (×6): 38 mg via INTRAVENOUS
  Filled 2020-04-10 (×6): qty 1

## 2020-04-10 MED ORDER — SODIUM CHLORIDE 0.9 % IV SOLN
2.0000 g | INTRAVENOUS | Status: DC
  Administered 2020-04-10: 2 g via INTRAVENOUS
  Filled 2020-04-10: qty 20

## 2020-04-10 MED ORDER — ONDANSETRON HCL 4 MG/2ML IJ SOLN
4.0000 mg | Freq: Four times a day (QID) | INTRAMUSCULAR | Status: DC | PRN
  Administered 2020-04-16: 4 mg via INTRAVENOUS
  Filled 2020-04-10: qty 2

## 2020-04-10 MED ORDER — GUAIFENESIN-DM 100-10 MG/5ML PO SYRP
10.0000 mL | ORAL_SOLUTION | ORAL | Status: DC | PRN
  Administered 2020-04-11 – 2020-05-10 (×33): 10 mL via ORAL
  Filled 2020-04-10 (×35): qty 10

## 2020-04-10 MED ORDER — LACTATED RINGERS IV BOLUS (SEPSIS): 500.0000 mL | Freq: Once | INTRAVENOUS | Status: DC

## 2020-04-10 MED ORDER — POLYETHYLENE GLYCOL 3350 17 G PO PACK
17.0000 g | PACK | Freq: Every day | ORAL | Status: DC | PRN
  Administered 2020-04-14: 17 g via ORAL
  Filled 2020-04-10: qty 1

## 2020-04-10 MED ORDER — SODIUM CHLORIDE 0.9 % IV SOLN
INTRAVENOUS | Status: DC | PRN
  Administered 2020-04-10: 250 mL via INTRAVENOUS

## 2020-04-10 MED ORDER — AMLODIPINE BESYLATE 10 MG PO TABS
10.0000 mg | ORAL_TABLET | Freq: Every day | ORAL | Status: DC
  Administered 2020-04-11 – 2020-05-10 (×29): 10 mg via ORAL
  Filled 2020-04-10 (×10): qty 1
  Filled 2020-04-10: qty 2
  Filled 2020-04-10 (×18): qty 1

## 2020-04-10 MED ORDER — ACETAMINOPHEN 325 MG PO TABS
650.0000 mg | ORAL_TABLET | Freq: Four times a day (QID) | ORAL | Status: DC | PRN
  Administered 2020-04-12 – 2020-04-21 (×8): 650 mg via ORAL
  Filled 2020-04-10 (×9): qty 2

## 2020-04-10 MED ORDER — ATORVASTATIN CALCIUM 20 MG PO TABS
20.0000 mg | ORAL_TABLET | Freq: Every day | ORAL | Status: DC
  Administered 2020-04-11 – 2020-04-21 (×10): 20 mg via ORAL
  Filled 2020-04-10 (×10): qty 1

## 2020-04-10 MED ORDER — ENOXAPARIN SODIUM 40 MG/0.4ML ~~LOC~~ SOLN
40.0000 mg | SUBCUTANEOUS | Status: DC
  Administered 2020-04-10 – 2020-05-09 (×30): 40 mg via SUBCUTANEOUS
  Filled 2020-04-10 (×30): qty 0.4

## 2020-04-10 MED ORDER — ONDANSETRON HCL 4 MG PO TABS
4.0000 mg | ORAL_TABLET | Freq: Four times a day (QID) | ORAL | Status: DC | PRN
Start: 2020-04-10 — End: 2020-05-11

## 2020-04-10 MED ORDER — SODIUM CHLORIDE 0.9 % IV SOLN
100.0000 mg | Freq: Every day | INTRAVENOUS | Status: AC
  Administered 2020-04-11 – 2020-04-14 (×4): 100 mg via INTRAVENOUS
  Filled 2020-04-10 (×4): qty 20

## 2020-04-10 MED ORDER — ASCORBIC ACID 500 MG PO TABS
500.0000 mg | ORAL_TABLET | Freq: Every day | ORAL | Status: DC
  Administered 2020-04-10 – 2020-04-21 (×11): 500 mg via ORAL
  Filled 2020-04-10 (×11): qty 1

## 2020-04-10 MED ORDER — PREDNISONE 50 MG PO TABS
50.0000 mg | ORAL_TABLET | Freq: Every day | ORAL | Status: DC
  Administered 2020-04-14 – 2020-04-15 (×2): 50 mg via ORAL
  Filled 2020-04-10 (×2): qty 1

## 2020-04-10 MED ORDER — SODIUM CHLORIDE 0.9 % IV SOLN
500.0000 mg | INTRAVENOUS | Status: DC
  Administered 2020-04-10 – 2020-04-14 (×5): 500 mg via INTRAVENOUS
  Filled 2020-04-10 (×5): qty 500

## 2020-04-10 MED ORDER — IOHEXOL 350 MG/ML SOLN
100.0000 mL | Freq: Once | INTRAVENOUS | Status: AC | PRN
  Administered 2020-04-10: 100 mL via INTRAVENOUS

## 2020-04-10 MED ORDER — ALBUTEROL SULFATE HFA 108 (90 BASE) MCG/ACT IN AERS
2.0000 | INHALATION_SPRAY | RESPIRATORY_TRACT | Status: DC | PRN
  Administered 2020-04-12 – 2020-04-14 (×3): 2 via RESPIRATORY_TRACT
  Filled 2020-04-10: qty 6.7

## 2020-04-10 MED ORDER — HYDRALAZINE HCL 20 MG/ML IJ SOLN
10.0000 mg | Freq: Four times a day (QID) | INTRAMUSCULAR | Status: DC | PRN
  Filled 2020-04-10: qty 1

## 2020-04-10 MED ORDER — ASPIRIN EC 81 MG PO TBEC
81.0000 mg | DELAYED_RELEASE_TABLET | Freq: Every day | ORAL | Status: DC
  Administered 2020-04-11 – 2020-05-10 (×29): 81 mg via ORAL
  Filled 2020-04-10 (×29): qty 1

## 2020-04-10 MED ORDER — SODIUM CHLORIDE 0.9 % IV SOLN
200.0000 mg | Freq: Once | INTRAVENOUS | Status: AC
  Administered 2020-04-10: 200 mg via INTRAVENOUS
  Filled 2020-04-10: qty 40

## 2020-04-10 MED ORDER — LACTATED RINGERS IV SOLN: INTRAVENOUS | Status: DC

## 2020-04-10 MED ORDER — SODIUM CHLORIDE 0.9 % IV SOLN
2.0000 g | INTRAVENOUS | Status: DC
  Administered 2020-04-11 – 2020-04-14 (×4): 2 g via INTRAVENOUS
  Filled 2020-04-10: qty 20
  Filled 2020-04-10: qty 2
  Filled 2020-04-10 (×2): qty 20

## 2020-04-10 NOTE — ED Triage Notes (Signed)
Coming from home, lung cancer patient, 95% on RA, complaining of SOB

## 2020-04-10 NOTE — Plan of Care (Signed)
  Problem: Education: Goal: Knowledge of General Education information will improve Description: Including pain rating scale, medication(s)/side effects and non-pharmacologic comfort measures Outcome: Progressing   Problem: Activity: Goal: Risk for activity intolerance will decrease Outcome: Progressing   Problem: Nutrition: Goal: Adequate nutrition will be maintained Outcome: Progressing   Problem: Activity: Goal: Ability to tolerate increased activity will improve Outcome: Progressing   Problem: Education: Goal: Knowledge of risk factors and measures for prevention of condition will improve Outcome: Progressing   Problem: Coping: Goal: Psychosocial and spiritual needs will be supported Outcome: Progressing

## 2020-04-10 NOTE — ED Provider Notes (Signed)
[facility] DEPT Provider Note   CSN: [id number] Arrival date & time: 04/10/20  1636     History Chief Complaint  Patient presents with  . Shortness of Breath    Terry Page. is a 65 y.o. male lung nodule (currently receiving SBRT, last treatment 04/08/20), previous history of malignant prostate cancer, spontaneous pneumothorax.    Patient presents with shortness of breath.  Patient reported history of shortness of breath began yesterday, she reports that shortness of breath is worse with exertion.  Patient denies any alleviating factors.  Patient endorses nonproductive cough and wheezing.  Denies any fever, chills, chest pain, palpitations, leg swelling, hemoptysis, history of DVT or PE, syncopal episode, lightheadedness.    HPI     Past Medical History:  Diagnosis Date  . Alcohol abuse   . 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 Hemorrhoids   . Incidental lung nodule, greater than or equal to 15mm 10/22/2017   Left upper lobe - discovered on CTA  . Prostate cancer ([place])   . Spontaneous pneumothorax 10/20/2017   right  . Tobacco abuse     Patient Active Problem List   Diagnosis Date Noted  . 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 Malignant neoplasm of prostate ([place]) 05/20/2019  . Hyponatremia 12/04/2017  . AKI (acute kidney injury) ([place]) 12/04/2017  . Incidental lung nodule, greater than or equal to 77mm 10/22/2017  . 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 Spontaneous pneumothorax 10/20/2017  . Tobacco abuse   . Alcohol abuse     Past Surgical History:  Procedure Laterality Date  . BACK SURGERY    . PROSTATE BIOPSY         Family History  Problem Relation Age of Onset  . Cancer Cousin        maternal cousin  . Cancer Cousin        paternal cousin  . Cancer Cousin   . Colon polyps Neg Hx   . Pancreatic disease Neg Hx   . Pancreatic cancer Neg Hx   . Breast cancer Neg Hx   . Colon cancer Neg Hx     Social History   Tobacco Use   . Smoking status: Former Smoker    Types: Cigarettes    Quit date: 11/22/2019    Years since quitting: 0.3  . Smokeless tobacco: Never Used  Vaping Use  . Vaping Use: Never used  Substance Use Topics  . Alcohol use: Yes    Alcohol/week: 12.0 standard drinks    Types: 12 Cans of beer per week    Comment: daily  . Drug use: No    Home Medications Prior to Admission medications   Medication Sig Start Date End Date Taking? Authorizing Provider  albuterol (VENTOLIN HFA) 108 (90 Base) MCG/ACT inhaler Inhale 2 puffs into the lungs every 6 (six) hours as needed for wheezing or shortness of breath. Patient not taking: Reported on 02/19/2020 06/06/19 07/06/19  Elodia Florence., MD  amLODipine (NORVASC) 10 MG tablet Take 10 mg by mouth daily.    [provider]  aspirin EC 81 MG tablet Take 81 mg by mouth daily.    [provider]  atorvastatin (LIPITOR) 20 MG tablet Take 20 mg by mouth daily. 01/28/19   [provider]  docusate sodium (COLACE) 100 MG capsule Take 100 mg by mouth daily.  Patient not taking: Reported on 02/19/2020    [provider]  olmesartan (BENICAR) 40 MG tablet Take 40 mg  by mouth daily. 11/16/17   [provider]    Allergies    Patient has no known allergies.  Review of Systems   Review of Systems  Constitutional: Negative for chills and fever.  HENT: Negative for congestion, rhinorrhea and sore throat.   Eyes: Negative for visual disturbance.  Respiratory: Positive for cough, shortness of breath and wheezing.   Cardiovascular: Negative for chest pain.  Gastrointestinal: Negative for abdominal distention, abdominal pain, constipation, diarrhea, nausea and vomiting.  Genitourinary: Negative for difficulty urinating and dysuria.  Musculoskeletal: Negative for back pain and neck pain.  Skin: Negative for color change and rash.  Neurological: Negative for dizziness, syncope, light-headedness and headaches.   Psychiatric/Behavioral: Negative for confusion.    Physical Exam Updated Vital Signs BP (!) 145/96 (BP Location: Left Arm)   Pulse (!) 107   Temp 97.9 F (36.6 C) (Oral)   Resp (!) 25   Ht 5\' 9"  (1.753 m)   Wt 76.2 kg   SpO2 97%   BMI 24.81 kg/m   Physical Exam Vitals and nursing note reviewed.  Constitutional:      General: He is not in acute distress.    Appearance: He is ill-appearing. He is not toxic-appearing or diaphoretic.  HENT:     Head: Normocephalic.  Eyes:     General: No scleral icterus.       Right eye: No discharge.        Left eye: No discharge.  Cardiovascular:     Rate and Rhythm: Tachycardia present.     Heart sounds: Normal heart sounds.     Comments: Tachycardia rate of 107 Pulmonary:     Effort: Pulmonary effort is normal. Tachypnea present. No respiratory distress.     Breath sounds: Examination of the right-upper field reveals wheezing. Examination of the left-upper field reveals wheezing. Examination of the right-middle field reveals wheezing. Examination of the left-middle field reveals wheezing. Examination of the right-lower field reveals wheezing. Examination of the left-lower field reveals wheezing. Wheezing (biphasic) present.  Abdominal:     General: Abdomen is protuberant. There is no distension. There are no signs of injury.     Palpations: Abdomen is soft. There is no mass or pulsatile mass.     Tenderness: There is no abdominal tenderness.     Hernia: There is no hernia in the umbilical area or ventral area.  Musculoskeletal:     Cervical back: Normal range of motion and neck supple.     Right lower leg: No tenderness. No edema.     Left lower leg: No tenderness. No edema.  Skin:    General: Skin is warm and dry.     Coloration: Skin is not cyanotic, jaundiced or pale.  Neurological:     General: No focal deficit present.     Mental Status: He is alert.  Psychiatric:        Behavior: Behavior is cooperative.     ED Results /  Procedures / Treatments   Labs (all labs ordered are listed, but only abnormal results are displayed) Labs Reviewed  RESP PANEL BY RT-PCR (FLU A&B, COVID) ARPGX2 - Abnormal; Notable for the following components:      Result Value   SARS Coronavirus 2 by RT PCR POSITIVE (*)    All other components within normal limits  CBC WITH DIFFERENTIAL/PLATELET - Abnormal; Notable for the following components:   WBC 36.3 (*)    RBC 3.87 (*)    Hemoglobin 11.7 (*)  HCT 34.1 (*)    Platelets 502 (*)    Neutro Abs 32.1 (*)    Monocytes Absolute 2.3 (*)    Abs Immature Granulocytes 1.12 (*)    All other components within normal limits  COMPREHENSIVE METABOLIC PANEL - Abnormal; Notable for the following components:   Sodium 119 (*)    Chloride 84 (*)    Glucose, Bld 142 (*)    Calcium 8.8 (*)    Albumin 3.0 (*)    Total Bilirubin 1.4 (*)    All other components within normal limits  PROTIME-INR - Abnormal; Notable for the following components:   Prothrombin Time 15.3 (*)    INR 1.3 (*)    All other components within normal limits  APTT - Abnormal; Notable for the following components:   aPTT 44 (*)    All other components within normal limits  URINALYSIS, ROUTINE W REFLEX MICROSCOPIC - Abnormal; Notable for the following components:   Specific Gravity, Urine 1.031 (*)    Hgb urine dipstick SMALL (*)    Ketones, ur 5 (*)    All other components within normal limits  C-REACTIVE PROTEIN - Abnormal; Notable for the following components:   CRP 32.4 (*)    All other components within normal limits  D-DIMER, QUANTITATIVE - Abnormal; Notable for the following components:   D-Dimer, Quant 3.22 (*)    All other components within normal limits  BASIC METABOLIC PANEL - Abnormal; Notable for the following components:   Sodium 118 (*)    Chloride 85 (*)    CO2 19 (*)    Glucose, Bld 159 (*)    Calcium 8.6 (*)    All other components within normal limits  CULTURE, BLOOD (SINGLE)  CULTURE, BLOOD  (ROUTINE X 2)  CULTURE, BLOOD (ROUTINE X 2)  URINE CULTURE  LACTIC ACID, PLASMA  LACTIC ACID, PLASMA  PROCALCITONIN  CBC WITH DIFFERENTIAL/PLATELET  C-REACTIVE PROTEIN  D-DIMER, QUANTITATIVE  MAGNESIUM  SODIUM, URINE, RANDOM  OSMOLALITY, URINE  OSMOLALITY  BASIC METABOLIC PANEL  BASIC METABOLIC PANEL  BLOOD GAS, VENOUS    EKG None  Radiology CT Angio Chest PE W and/or Wo Contrast  Result Date: 04/10/2020 CLINICAL DATA:  Cough and shortness of breath. Leukocytosis. History of lung cancer with radiation therapy. History of prostate cancer. History of smoking. EXAM: CT ANGIOGRAPHY CHEST WITH CONTRAST TECHNIQUE: Multidetector CT imaging of the chest was performed using the standard protocol during bolus administration of intravenous contrast. Multiplanar CT image reconstructions and MIPs were obtained to evaluate the vascular anatomy. CONTRAST:  153mL OMNIPAQUE IOHEXOL 350 MG/ML SOLN COMPARISON:  Portable chest obtained earlier today. Chest CT dated 01/07/2020. PET-CT dated 02/06/2020. FINDINGS: Cardiovascular: Normally opacified pulmonary arteries with no pulmonary arterial filling defects seen. Atheromatous calcifications, including the coronary arteries and aorta. Mediastinum/Nodes: Interval mildly enlarged precarinal node with a short axis diameter 12 mm on image number 63/4. Interval mildly enlarged subcarinal node with a short axis diameter of 9 mm on image number 74/4. Interval mildly enlarged right hilar node with a short axis diameter of 7 mm on image number 78/4. Unremarkable thyroid gland and esophagus. Lungs/Pleura: Interval extensive patchy and confluent airspace opacity in the right upper lobe. There is also an interval small amount of pleural fluid medially in the right upper and lower chest. Interval nodular density in the medial aspect of the right upper lobe measuring 1.2 x 1.1 cm on image number 80/6. There is also an interval nodular density in the medial aspect of  the right  lower lobe, measuring 1.5 x 1.3 cm on image number 73/6. An irregular, spiculated left upper lobe nodule is again demonstrated. This measures 1.2 x 0.9 cm on image number 51/6. This measured 1.3 x 1.1 cm on 02/06/2020. Extensive bilateral bullous changes are again demonstrated. There is an interval 2.1 x 2.0 cm nodular area adjacent to the posterior aspect of a bullous in the lingula with no significant change in a smaller, similar area nearby, slightly more superiorly and laterally on image number 77/6. There is also an interval nodular density in the left lower lobe measuring 2.6 x 1.5 cm on image number 102/6. Upper Abdomen: Diffuse low density of the liver relative to the spleen. Stable atrophic splenic remnant. Partially included right renal cysts. Musculoskeletal: Review of the MIP images confirms the above findings. IMPRESSION: 1. Interval extensive patchy and confluent airspace opacity in the right upper lobe, compatible with pneumonia. 2. Interval small amount of right pleural fluid. 3. Interval mild mediastinal and right hilar adenopathy, most likely reactive. 4. Interval nodular densities in both lungs, as described above. These are concerning for possible metastases. 5. Interval slight decrease in size of a small spiculated nodule in the left upper lobe compatible with a metastasis or additional primary lung carcinoma. 6. 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 7. Diffuse hepatic steatosis. 8. Calcific coronary artery and aortic atherosclerosis. 9.  Calcific coronary artery and aortic atherosclerosis. Aortic Atherosclerosis (ICD10-I70.0) and Emphysema (ICD10-J43.9). Electronically Signed   By: Claudie Revering M.D.   On: 04/10/2020 20:01   DG Chest Portable 1 View  Result Date: 04/10/2020 CLINICAL DATA:  Cough, shortness of breath, history of bilateral lung cancer EXAM: PORTABLE CHEST 1 VIEW COMPARISON:  Chest radiographs, 06/13/2019, CT chest, 01/06/2018 FINDINGS: The heart size and mediastinal  contours are within normal limits. Heterogeneous airspace opacity of the peripheral right upper lobe. Bandlike scarring of the left midlung and spiculated nodule of the central left upper lobe. The visualized skeletal structures are unremarkable. IMPRESSION: 1. Heterogeneous airspace opacity of the peripheral right upper lobe, consistent with infection. 2. Bandlike scarring of the left midlung and spiculated nodule of the central left upper lobe, better assessed by prior CT. Electronically Signed   By: Eddie Candle M.D.   On: 04/10/2020 18:13    Procedures .Critical Care Performed by: Loni Beckwith, PA-C Authorized by: Loni Beckwith, PA-C   Critical care provider statement:    Critical care time (minutes):  30   Critical care was necessary to treat or prevent imminent or life-threatening deterioration of the following conditions:  Sepsis   Critical care was time spent personally by me on the following activities:  Discussions with consultants, evaluation of patient's response to treatment, examination of patient, ordering and performing treatments and interventions, ordering and review of laboratory studies, ordering and review of radiographic studies, pulse oximetry, re-evaluation of patient's condition, obtaining history from patient or surrogate and review of old charts   Care discussed with: admitting provider       Medications Ordered in ED Medications  albuterol (PROVENTIL) (2.5 MG/3ML) 0.083% nebulizer solution 2.5 mg (has no administration in time range)    ED Course  I have reviewed the triage vital signs and the nursing notes.  Pertinent labs & imaging results that were available during my care of the patient were reviewed by me and considered in my medical decision making (see chart for details).    MDM Rules/Calculators/A&P  Alert 65 year old male no acute stress, nontoxic-appearing, patient appears ill.  Patient presents with chief  plaint of shortness of breath x2 days.  Shortness of breath is worse with exertion.  Patient currently receiving SBRT for lung cancer.  Physical exam patient noted to have wheezing and coarse lung sounds in all lobes.  Patient noted to be tachypneic at rate of 25.  Tachycardia at rate of 107.  Oxygen saturation 97% on room air.  Concern for pneumonia, COVID-19, PE, COPD exacerbation. Will give patient albuterol nebulizer treatment and Solu-Medrol injection. BMP, CMP, CBC with differential, COVID-19, chest CTA ordered.  Chest x-ray shows heterogeneous airspace opacity of the peripheral right upper lobe consistent with infection. CBC shows leukocytosis at 36.3 Based on these findings we will treat patient for pneumonia and start sepsis work-up.  Will consult hospitalist for admission.  CMP shows sodium decreased at 119, chloride decreased at 84.  Patient has history of SIADH.  Will limit patient's fluid resuscitation to 500 mL of lactated Ringer's.  Patient tested positive for COVID-19.  On reevaluation patient oxygen saturation 96% on room air.  Patient in no acute respiratory distress.  Patient hemodynamically stable.  1940 spoke to hospatilist Dr. Cyd Silence who will see the patient for admission.   Fernado Brigante. was evaluated in Emergency Department on 04/11/2020 for the symptoms described in the history of present illness. He was evaluated in the context of the global COVID-19 pandemic, which necessitated consideration that the patient might be at risk for infection with the SARS-CoV-2 virus that causes COVID-19. Institutional protocols and algorithms that pertain to the evaluation of patients at risk for COVID-19 are in a state of rapid change based on information released by regulatory bodies including the CDC and federal and state organizations. These policies and algorithms were followed during the patient's care in the ED.    Final Clinical Impression(s) / ED Diagnoses Final  diagnoses:  Pneumonia due to COVID-19 virus    Rx / DC Orders ED Discharge Orders    None       Dyann Ruddle 04/11/20 0039    Lacretia Leigh, MD 04/15/20 1118

## 2020-04-10 NOTE — ED Triage Notes (Signed)
Pt came from home via EMS. C/c SOB. Started last night. Worsening today, expiratory wheezing began today upon exertion. Ambulatory with difficulty. A&O x4. PMH of bilateral lung cancer, currently receiving cancer tx 2x/ week, supposed to be finishing tx next week.

## 2020-04-10 NOTE — H&P (Signed)
History and Physical    [name]. SWH:[id number] DOB: [birth year] DOA: 04/10/2020  PCP: [name], MD  Patient coming from: home   Chief Complaint:  Chief Complaint  Patient presents with  . Shortness of Breath     HPI:    65 year old male with past medical history of COPD with both emphysema, spontaneous pneumothorax (2019 and 2021), SIADH, hypertension, hyperlipidemia, prostate cancer and left lung cancer (currently receiving radiation therapy) who presents to [facility] emergency department with complaints of shortness of breath.  Patient began to experience severe shortness of breath yesterday.  The shortness of breath was rather sudden onset, rapidly progressive and severe in intensity.  Shortness breath is worse with minimal exertion and improved with rest.  Symptoms of shortness of breath are associated with cough that is nonproductive.  Patient denies any associated fever, sick contacts, recent travel or confirmed contacts with individuals with COVID-19 infection.  Patient denies any associated chest pain.  Upon further questioning patient additionally denies any leg swelling paroxysmal nocturnal dyspnea or pillow orthopnea.  Patient denies any lack of taste or smell or significant muscle aches.  Patient symptoms of shortness of breath cough and associated weakness continued to persist overnight and today the patient eventually presented to [facility] emergency department for evaluation.  Upon evaluation in the emergency department, she was found to be in respiratory distress but not hypoxic.  COVID-19 PCR testing was found to be positive.  Patient was additionally found to have extensive patchy infiltrates in the right upper lobe concerning for pneumonia with a small right pleural effusion.  There was no evidence of pulmonary embolism.  Sodium was found to be 119.  Patient also found to have multiple SIRS criteria including substantial leukocytosis of  36.3 and tachycardia concerning for developing sepsis.  Patient was initiated on intravenous ceftriaxone and azithromycin.  The hospitalist group was then called to assess the patient for admission to the hospital.    Review of Systems:   Review of Systems  Constitutional: Positive for malaise/fatigue.  Respiratory: Positive for cough, shortness of breath and wheezing.   Neurological: Positive for weakness.  All other systems reviewed and are negative.   Past Medical History:  Diagnosis Date  . Alcohol abuse   . Asthma   . 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 Essential hypertension 04/10/2020  . Hemorrhoids   . Incidental lung nodule, greater than or equal to 31mm 10/22/2017   Left upper lobe - discovered on CTA  . Prostate cancer ([place])   . Spontaneous pneumothorax 10/20/2017   right  . Tobacco abuse     Past Surgical History:  Procedure Laterality Date  . BACK SURGERY    . PROSTATE BIOPSY       reports that he quit smoking about 4 months ago. His smoking use included cigarettes. He has never used smokeless tobacco. He reports current alcohol use of about 12.0 standard drinks of alcohol per week. He reports that he does not use drugs.  No Known Allergies  Family History  Problem Relation Age of Onset  . Cancer Cousin        maternal cousin  . Cancer Cousin        paternal cousin  . Cancer Cousin   . Colon polyps Neg Hx   . Pancreatic disease Neg Hx   . Pancreatic cancer Neg Hx   . Breast cancer Neg Hx   . Colon cancer Neg Hx  Prior to Admission medications   Medication Sig Start Date End Date Taking? Authorizing Provider  amLODipine (NORVASC) 10 MG tablet Take 10 mg by mouth daily.   Yes [provider]  aspirin EC 81 MG tablet Take 81 mg by mouth daily.   Yes [provider]  atorvastatin (LIPITOR) 20 MG tablet Take 20 mg by mouth daily. 01/28/19  Yes [provider]  naproxen sodium (ALEVE) 220 MG tablet Take 220 mg by mouth daily as needed  (pain).   Yes [provider]  olmesartan (BENICAR) 40 MG tablet Take 40 mg by mouth daily. 11/16/17  Yes [provider]  albuterol (VENTOLIN HFA) 108 (90 Base) MCG/ACT inhaler Inhale 2 puffs into the lungs every 6 (six) hours as needed for wheezing or shortness of breath. Patient not taking: No sig reported 06/06/19 07/06/19  Elodia Florence., MD  docusate sodium (COLACE) 100 MG capsule Take 100 mg by mouth daily.  Patient not taking: No sig reported    [provider]    Physical Exam: Vitals:   04/10/20 1709 04/10/20 1710 04/10/20 1745 04/10/20 1815  BP: (!) 145/96  (!) 136/108 (!) 130/102  Pulse: (!) 107  (!) 103 (!) 106  Resp: (!) 25  (!) 26 (!) 29  Temp: 97.9 F (36.6 C)     TempSrc: Oral     SpO2: 97%  96% 99%  Weight:  76.2 kg    Height:  5\' 9"  (1.753 m)      Constitutional: Awake alert and oriented x3, patient is in significant respiratory distress. Skin: no rashes, no lesions, good skin turgor noted. Eyes: Pupils are equally reactive to light.  No evidence of scleral icterus or conjunctival pallor.  ENMT: Moist mucous membranes noted.  Posterior pharynx clear of any exudate or lesions.   Neck: normal, supple, no masses, no thyromegaly.  No evidence of jugular venous distension.   Respiratory: Scattered rhonchi bilaterally with substantial rales noted throughout the right lung fields.  Notable expiratory wheezing with prolonged aspiratory phase.  Patient is exhibiting increased respiratory effort with some evidence of accessory muscle use. Cardiovascular: Tachycardic rate with regular rhythm,, no murmurs / rubs / gallops. No extremity edema. 2+ pedal pulses. No carotid bruits.  Chest:   Nontender without crepitus or deformity.   Back:   Nontender without crepitus or deformity. Abdomen: Abdomen is extremely protuberant but soft and nontender.  No evidence of intra-abdominal masses.  Positive bowel sounds noted in all quadrants.   Musculoskeletal:  No joint deformity upper and lower extremities. Good ROM, no contractures. Normal muscle tone.  Neurologic: CN 2-12 grossly intact. Sensation intact.  Patient moving all 4 extremities spontaneously.  Patient is following all commands.  Patient is responsive to verbal stimuli.   Psychiatric: Patient exhibits normal mood with appropriate affect.  Patient seems to possess insight as to their current situation.     Labs on Admission: I have personally reviewed following labs and imaging studies -   CBC: Recent Labs  Lab 04/10/20 1725  WBC 36.3*  NEUTROABS 32.1*  HGB 11.7*  HCT 34.1*  MCV 88.1  PLT 742*   Basic Metabolic Panel: Recent Labs  Lab 04/10/20 1725  NA 119*  K 4.0  CL 84*  CO2 22  GLUCOSE 142*  BUN 19  CREATININE 0.93  CALCIUM 8.8*   GFR: Estimated Creatinine Clearance: 78.1 mL/min (by C-G formula based on SCr of 0.93 mg/dL). Liver Function Tests: Recent Labs  Lab 04/10/20 1725  AST 37  ALT 35  ALKPHOS 75  BILITOT 1.4*  PROT 7.9  ALBUMIN 3.0*   No results for input(s): LIPASE, AMYLASE in the last 168 hours. No results for input(s): AMMONIA in the last 168 hours. Coagulation Profile: Recent Labs  Lab 04/10/20 1801  INR 1.3*   Cardiac Enzymes: No results for input(s): CKTOTAL, CKMB, CKMBINDEX, TROPONINI in the last 168 hours. BNP (last 3 results) No results for input(s): PROBNP in the last 8760 hours. HbA1C: No results for input(s): HGBA1C in the last 72 hours. CBG: No results for input(s): GLUCAP in the last 168 hours. Lipid Profile: No results for input(s): CHOL, HDL, LDLCALC, TRIG, CHOLHDL, LDLDIRECT in the last 72 hours. Thyroid Function Tests: No results for input(s): TSH, T4TOTAL, FREET4, T3FREE, THYROIDAB in the last 72 hours. Anemia Panel: No results for input(s): VITAMINB12, FOLATE, FERRITIN, TIBC, IRON, RETICCTPCT in the last 72 hours. Urine analysis:    Component Value Date/Time   COLORURINE YELLOW 06/03/2019 1440   APPEARANCEUR  CLEAR 06/03/2019 1440   LABSPEC 1.015 06/03/2019 1440   PHURINE 7.0 06/03/2019 1440   GLUCOSEU NEGATIVE 06/03/2019 1440   HGBUR NEGATIVE 06/03/2019 1440   BILIRUBINUR NEGATIVE 06/03/2019 1440   KETONESUR NEGATIVE 06/03/2019 1440   PROTEINUR NEGATIVE 06/03/2019 1440   NITRITE NEGATIVE 06/03/2019 1440   LEUKOCYTESUR NEGATIVE 06/03/2019 1440    Radiological Exams on Admission - Personally Reviewed: CT Angio Chest PE W and/or Wo Contrast  Result Date: 04/10/2020 CLINICAL DATA:  Cough and shortness of breath. Leukocytosis. History of lung cancer with radiation therapy. History of prostate cancer. History of smoking. EXAM: CT ANGIOGRAPHY CHEST WITH CONTRAST TECHNIQUE: Multidetector CT imaging of the chest was performed using the standard protocol during bolus administration of intravenous contrast. Multiplanar CT image reconstructions and MIPs were obtained to evaluate the vascular anatomy. CONTRAST:  169mL OMNIPAQUE IOHEXOL 350 MG/ML SOLN COMPARISON:  Portable chest obtained earlier today. Chest CT dated 01/07/2020. PET-CT dated 02/06/2020. FINDINGS: Cardiovascular: Normally opacified pulmonary arteries with no pulmonary arterial filling defects seen. Atheromatous calcifications, including the coronary arteries and aorta. Mediastinum/Nodes: Interval mildly enlarged precarinal node with a short axis diameter 12 mm on image number 63/4. Interval mildly enlarged subcarinal node with a short axis diameter of 9 mm on image number 74/4. Interval mildly enlarged right hilar node with a short axis diameter of 7 mm on image number 78/4. Unremarkable thyroid gland and esophagus. Lungs/Pleura: Interval extensive patchy and confluent airspace opacity in the right upper lobe. There is also an interval small amount of pleural fluid medially in the right upper and lower chest. Interval nodular density in the medial aspect of the right upper lobe measuring 1.2 x 1.1 cm on image number 80/6. There is also an interval  nodular density in the medial aspect of the right lower lobe, measuring 1.5 x 1.3 cm on image number 73/6. An irregular, spiculated left upper lobe nodule is again demonstrated. This measures 1.2 x 0.9 cm on image number 51/6. This measured 1.3 x 1.1 cm on 02/06/2020. Extensive bilateral bullous changes are again demonstrated. There is an interval 2.1 x 2.0 cm nodular area adjacent to the posterior aspect of a bullous in the lingula with no significant change in a smaller, similar area nearby, slightly more superiorly and laterally on image number 77/6. There is also an interval nodular density in the left lower lobe measuring 2.6 x 1.5 cm on image number 102/6. Upper Abdomen: Diffuse low density of the liver relative to the spleen. Stable  atrophic splenic remnant. Partially included right renal cysts. Musculoskeletal: Review of the MIP images confirms the above findings. IMPRESSION: 1. Interval extensive patchy and confluent airspace opacity in the right upper lobe, compatible with pneumonia. 2. Interval small amount of right pleural fluid. 3. Interval mild mediastinal and right hilar adenopathy, most likely reactive. 4. Interval nodular densities in both lungs, as described above. These are concerning for possible metastases. 5. Interval slight decrease in size of a small spiculated nodule in the left upper lobe compatible with a metastasis or additional primary lung carcinoma. 6. 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 7. Diffuse hepatic steatosis. 8. Calcific coronary artery and aortic atherosclerosis. 9.  Calcific coronary artery and aortic atherosclerosis. Aortic Atherosclerosis (ICD10-I70.0) and Emphysema (ICD10-J43.9). Electronically Signed   By: Claudie Revering M.D.   On: 04/10/2020 20:01   DG Chest Portable 1 View  Result Date: 04/10/2020 CLINICAL DATA:  Cough, shortness of breath, history of bilateral lung cancer EXAM: PORTABLE CHEST 1 VIEW COMPARISON:  Chest radiographs, 06/13/2019, CT chest,  01/06/2018 FINDINGS: The heart size and mediastinal contours are within normal limits. Heterogeneous airspace opacity of the peripheral right upper lobe. Bandlike scarring of the left midlung and spiculated nodule of the central left upper lobe. The visualized skeletal structures are unremarkable. IMPRESSION: 1. Heterogeneous airspace opacity of the peripheral right upper lobe, consistent with infection. 2. Bandlike scarring of the left midlung and spiculated nodule of the central left upper lobe, better assessed by prior CT. Electronically Signed   By: Eddie Candle M.D.   On: 04/10/2020 18:13    EKG: Personally reviewed.  Rhythm is sinus tachycardia with heart rate of 103bpm.  No dynamic ST segment changes appreciated.  Assessment/Plan Principal Problem:   Sepsis, multifactorial secondary to right upper lobe bacterial pneumonia and concurrent COVID-19 infection.   Patient is presenting with 24 to 36-hour history of rather rapid onset of shortness of breath and dyspnea on exertion.  Patient found to have COVID-19 PCR positive in the setting of extensive right upper lobe infiltrates   Presence of profound leukocytosis with multiple other SIRS criteria being about concern that patient is likely suffering from COVID-19 with bacterial coinfection causing the substantial pneumonia.   Patient is at extremely high risk of rapid clinical decompensation due to presence of lung malignancy.  Patient is already been initiated on intravenous ceftriaxone and azithromycin by the emergency department staff which will be continued.  Despite not exhibiting oxygen levels less than 94%, initiating patient on intravenous remdesivir due to risk of rapid progression to more severe disease.  Furthermore, due to concurrent COPD exacerbation, treating patient with intravenous Solu-Medrol  Measured intravenous volume resuscitation with isotonic fluids.  Not performing 30 cc/kg due to presence of SIADH with sodium  levels currently 119 and patient being near euvolemic.  Blood cultures have been obtained and are pending.  Patient currently is not producing sputum and therefore sputum culture has not been ordered.  Placing patient in progressive unit for close clinical monitoring and aggressive care.  Active Problems:   COPD with acute exacerbation ([place])   Patient has known history of substantial COPD with bullous emphysema, suffering 2 spontaneous pneumothoraces in the past.  Patient exhibiting respiratory distress with prolonged expiratory phase and expiratory wheezing concerning for concurrent COPD exacerbation with patient's infectious pulmonary process  Providing patient with scheduled bronchodilator therapy and intravenous steroids  Considering degree of respiratory distress despite lack of hypoxia, will obtain VBG to assess PCO2 and determine if noninvasive blood  pressure ventilation is necessary    SIADH (syndrome of inappropriate ADH production) ([place])   Patient presenting with substantial hyponatremia of 119, with a similar degree of hyponatremia as prior hospitalizations  Patient has previous diagnosis of SIADH, felt to be related to patient's known lung malignancy  500 cc isotonic fluid bolus has been initiated by the emergency department staff.  On my exam, patient actually appears to be near euvolemic and therefore I did not believe that a 30 cc/kg weight resuscitation is necessary despite diagnosis of sepsis.  Obtaining urine sodium, serum osmolality and urine osmolality to confirm presence of SIADH  Once this is confirmed, will begin relative fluid restriction to 1200 cc daily barring any evidence of developing hypovolemia or progressive sepsis/shock.  BMP every 4 hours for this evening for close monitoring of sodium levels.    Primary cancer of left upper lobe of lung ([place])   Spiculated lung mass initially identified during hospitalization in 12/2017.    This was initially  managed with radiation therapy 04/2018  Patient has unfortunately developed another area concerning for left upper lobe lung malignancy, confirmed via PET imaging performed on 2/4  Patient is now once again receiving radiation therapy  Presence of lung malignancy place patient at risk of rapid clinical decompensation.  History of prostate cancer  Status post radiation therapy  Outpatient follow-up    Essential hypertension   Continue home regimen of antihypertensive therapy as blood pressure tolerates    Mixed hyperlipidemia . Continuing home regimen of lipid lowering therapy.   Code Status:  Full code Family Communication: deferred   Status is: Observation  The patient remains OBS appropriate and will d/c before 2 midnights.  Dispo: The patient is from: Home              Anticipated d/c is to: Home              Patient currently is not medically stable to d/c.   Difficult to place patient No        Vernelle Emerald MD [facility] Pager [phone]  If 7PM-7AM, please contact night-coverage [email or web address] Use universal [place] password for that web site. If you do not have the password, please call the hospital operator.  04/10/2020, 9:00 PM

## 2020-04-10 NOTE — ED Notes (Signed)
ED TO INPATIENT HANDOFF REPORT  ED Nurse Name and Phone #:    S Name/Age/Gender Terry Page. 65 y.o. male Room/Bed: WA02/WA02  Code Status   Code Status: Full Code  Home/SNF/Other Home Patient oriented to: self, place, time and situation Is this baseline? Yes   Triage Complete: Triage complete  Chief Complaint COVID-19 virus infection [U07.1]  Triage Note Coming from home, lung cancer patient, 95% on RA, complaining of SOB  Pt came from home via EMS. C/c SOB. Started last night. Worsening today, expiratory wheezing began today upon exertion. Ambulatory with difficulty. A&O x4. PMH of bilateral lung cancer, currently receiving cancer tx 2x/ week, supposed to be finishing tx next week.    Allergies No Known Allergies  Level of Care/Admitting Diagnosis ED Disposition    ED Disposition Condition [facility]: [place] [[id number]]  Level of Care: Progressive [102]  Admit to Progressive based on following criteria: RESPIRATORY PROBLEMS hypoxemic/hypercapnic respiratory failure that is responsive to NIPPV (BiPAP) or High Flow Nasal Cannula (6-80 lpm). Frequent assessment/intervention, no > Q2 hrs < Q4 hrs, to maintain oxygenation and pulmonary hygiene.  Covid Evaluation: Confirmed COVID Positive  Diagnosis: COVID-19 virus infection [[id number]]  Admitting Physician: Vernelle Emerald [[id number]]  Attending Physician: Vernelle Emerald [[id number]]       B Medical/Surgery History Past Medical History:  Diagnosis Date  . Alcohol abuse   . Asthma   . 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 Essential hypertension 04/10/2020  . Hemorrhoids   . Incidental lung nodule, greater than or equal to 72mm 10/22/2017   Left upper lobe - discovered on CTA  . Prostate cancer ([place])   . Spontaneous pneumothorax 10/20/2017   right  . Tobacco abuse    Past Surgical History:  Procedure Laterality Date  . BACK SURGERY    . PROSTATE BIOPSY       A IV  Location/Drains/Wounds Patient Lines/Drains/Airways Status    Active Line/Drains/Airways    Name Placement date Placement time Site Days   Peripheral IV 01/07/20 Right Antecubital 01/07/20  0914  Antecubital  94   Peripheral IV 04/10/20 Right Antecubital 04/10/20  1645  Antecubital  less than 1          Intake/Output Last 24 hours No intake or output data in the 24 hours ending 04/10/20 2023  Labs/Imaging Results for orders placed or performed during the hospital encounter of 04/10/20 (from the past 48 hour(s))  CBC with Differential     Status: Abnormal   Collection Time: 04/10/20  5:25 PM  Result Value Ref Range   WBC 36.3 (H) 4.0 - 10.5 K/uL   RBC 3.87 (L) 4.22 - 5.81 MIL/uL   Hemoglobin 11.7 (L) 13.0 - 17.0 g/dL   HCT 34.1 (L) 39.0 - 52.0 %   MCV 88.1 80.0 - 100.0 fL   MCH 30.2 26.0 - 34.0 pg   MCHC 34.3 30.0 - 36.0 g/dL   RDW 13.6 11.5 - 15.5 %   Platelets 502 (H) 150 - 400 K/uL   nRBC 0.0 0.0 - 0.2 %   Neutrophils Relative % 89 %   Neutro Abs 32.1 (H) 1.7 - 7.7 K/uL   Lymphocytes Relative 2 %   Lymphs Abs 0.7 0.7 - 4.0 K/uL   Monocytes Relative 6 %   Monocytes Absolute 2.3 (H) 0.1 - 1.0 K/uL   Eosinophils Relative 0 %   Eosinophils Absolute 0.0 0.0 - 0.5 K/uL   Basophils  Relative 0 %   Basophils Absolute 0.1 0.0 - 0.1 K/uL   Immature Granulocytes 3 %   Abs Immature Granulocytes 1.12 (H) 0.00 - 0.07 K/uL   Dohle Bodies PRESENT     Comment: Performed at [facility], [place] [address] [place], [place] [address]  Comprehensive metabolic panel     Status: Abnormal   Collection Time: 04/10/20  5:25 PM  Result Value Ref Range   Sodium 119 (LL) 135 - 145 mmol/L    Comment: CRITICAL RESULT CALLED TO, READ BACK BY AND VERIFIED WITH: Derinda Late RN 04/10/20 @1908  BY P.HENDERSON    Potassium 4.0 3.5 - 5.1 mmol/L   Chloride 84 (L) 98 - 111 mmol/L   CO2 22 22 - 32 mmol/L   Glucose, Bld 142 (H) 70 - 99 mg/dL    Comment: Glucose reference range applies  only to samples taken after fasting for at least 8 hours.   BUN 19 8 - 23 mg/dL   Creatinine, Ser 0.93 0.61 - 1.24 mg/dL   Calcium 8.8 (L) 8.9 - 10.3 mg/dL   Total Protein 7.9 6.5 - 8.1 g/dL   Albumin 3.0 (L) 3.5 - 5.0 g/dL   AST 37 15 - 41 U/L   ALT 35 0 - 44 U/L   Alkaline Phosphatase 75 38 - 126 U/L   Total Bilirubin 1.4 (H) 0.3 - 1.2 mg/dL   GFR, Estimated >60 >60 mL/min    Comment: (NOTE) Calculated using the CKD-EPI Creatinine Equation (2021)    Anion gap 13 5 - 15    Comment: Performed at [facility], [place] [address] [place], [place] [address]  Resp Panel by RT-PCR (Flu A&B, Covid) Nasopharyngeal Swab     Status: Abnormal   Collection Time: 04/10/20  5:35 PM   Specimen: Nasopharyngeal Swab; Nasopharyngeal(NP) swabs in vial transport medium  Result Value Ref Range   SARS Coronavirus 2 by RT PCR POSITIVE (A) NEGATIVE    Comment: RESULT CALLED TO, READ BACK BY AND VERIFIED WITH: JOHN FRICKUY RN04/09/22 @1916  BY P.HENDERSON (NOTE) SARS-CoV-2 target nucleic acids are DETECTED.  The SARS-CoV-2 RNA is generally detectable in upper respiratory specimens during the acute phase of infection. Positive results are indicative of the presence of the identified virus, but do not rule out bacterial infection or co-infection with other pathogens not detected by the test. Clinical correlation with patient history and other diagnostic information is necessary to determine patient infection status. The expected result is Negative.  Fact Sheet for Patients: EntrepreneurPulse.com.au  Fact Sheet for Healthcare Providers: IncredibleEmployment.be  This test is not yet approved or cleared by the [place] FDA and  has been authorized for detection and/or diagnosis of SARS-CoV-2 by FDA under an Emergency Use Authorization (EUA).  This EUA will remain in effect (meaning this  test can be used) for the duration of  the COVID-19  declaration under Section 564(b)(1) of the Act, 21 U.S.C. section 360bbb-3(b)(1), unless the authorization is terminated or revoked sooner.     Influenza A by PCR NEGATIVE NEGATIVE   Influenza B by PCR NEGATIVE NEGATIVE    Comment: (NOTE) The Xpert Xpress SARS-CoV-2/FLU/RSV plus assay is intended as an aid in the diagnosis of influenza from Nasopharyngeal swab specimens and should not be used as a sole basis for treatment. Nasal washings and aspirates are unacceptable for Xpert Xpress SARS-CoV-2/FLU/RSV testing.  Fact Sheet for Patients: EntrepreneurPulse.com.au  Fact Sheet for Healthcare Providers: IncredibleEmployment.be  This test is not yet approved  or cleared by the [place] and has been authorized for detection and/or diagnosis of SARS-CoV-2 by FDA under an Emergency Use Authorization (EUA). This EUA will remain in effect (meaning this test can be used) for the duration of the COVID-19 declaration under Section 564(b)(1) of the Act, 21 U.S.C. section 360bbb-3(b)(1), unless the authorization is terminated or revoked.  Performed at [facility], [place] [address] [place], [place] [address]    CT Angio Chest PE W and/or Wo Contrast  Result Date: 04/10/2020 CLINICAL DATA:  Cough and shortness of breath. Leukocytosis. History of lung cancer with radiation therapy. History of prostate cancer. History of smoking. EXAM: CT ANGIOGRAPHY CHEST WITH CONTRAST TECHNIQUE: Multidetector CT imaging of the chest was performed using the standard protocol during bolus administration of intravenous contrast. Multiplanar CT image reconstructions and MIPs were obtained to evaluate the vascular anatomy. CONTRAST:  137mL OMNIPAQUE IOHEXOL 350 MG/ML SOLN COMPARISON:  Portable chest obtained earlier today. Chest CT dated 01/07/2020. PET-CT dated 02/06/2020. FINDINGS: Cardiovascular: Normally opacified pulmonary arteries with no pulmonary  arterial filling defects seen. Atheromatous calcifications, including the coronary arteries and aorta. Mediastinum/Nodes: Interval mildly enlarged precarinal node with a short axis diameter 12 mm on image number 63/4. Interval mildly enlarged subcarinal node with a short axis diameter of 9 mm on image number 74/4. Interval mildly enlarged right hilar node with a short axis diameter of 7 mm on image number 78/4. Unremarkable thyroid gland and esophagus. Lungs/Pleura: Interval extensive patchy and confluent airspace opacity in the right upper lobe. There is also an interval small amount of pleural fluid medially in the right upper and lower chest. Interval nodular density in the medial aspect of the right upper lobe measuring 1.2 x 1.1 cm on image number 80/6. There is also an interval nodular density in the medial aspect of the right lower lobe, measuring 1.5 x 1.3 cm on image number 73/6. An irregular, spiculated left upper lobe nodule is again demonstrated. This measures 1.2 x 0.9 cm on image number 51/6. This measured 1.3 x 1.1 cm on 02/06/2020. Extensive bilateral bullous changes are again demonstrated. There is an interval 2.1 x 2.0 cm nodular area adjacent to the posterior aspect of a bullous in the lingula with no significant change in a smaller, similar area nearby, slightly more superiorly and laterally on image number 77/6. There is also an interval nodular density in the left lower lobe measuring 2.6 x 1.5 cm on image number 102/6. Upper Abdomen: Diffuse low density of the liver relative to the spleen. Stable atrophic splenic remnant. Partially included right renal cysts. Musculoskeletal: Review of the MIP images confirms the above findings. IMPRESSION: 1. Interval extensive patchy and confluent airspace opacity in the right upper lobe, compatible with pneumonia. 2. Interval small amount of right pleural fluid. 3. Interval mild mediastinal and right hilar adenopathy, most likely reactive. 4. Interval  nodular densities in both lungs, as described above. These are concerning for possible metastases. 5. Interval slight decrease in size of a small spiculated nodule in the left upper lobe compatible with a metastasis or additional primary lung carcinoma. 6. 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 7. Diffuse hepatic steatosis. 8. Calcific coronary artery and aortic atherosclerosis. 9.  Calcific coronary artery and aortic atherosclerosis. Aortic Atherosclerosis (ICD10-I70.0) and Emphysema (ICD10-J43.9). Electronically Signed   By: Claudie Revering M.D.   On: 04/10/2020 20:01   DG Chest Portable 1 View  Result Date: 04/10/2020 CLINICAL DATA:  Cough, shortness of breath, history  of bilateral lung cancer EXAM: PORTABLE CHEST 1 VIEW COMPARISON:  Chest radiographs, 06/13/2019, CT chest, 01/06/2018 FINDINGS: The heart size and mediastinal contours are within normal limits. Heterogeneous airspace opacity of the peripheral right upper lobe. Bandlike scarring of the left midlung and spiculated nodule of the central left upper lobe. The visualized skeletal structures are unremarkable. IMPRESSION: 1. Heterogeneous airspace opacity of the peripheral right upper lobe, consistent with infection. 2. Bandlike scarring of the left midlung and spiculated nodule of the central left upper lobe, better assessed by prior CT. Electronically Signed   By: Eddie Candle M.D.   On: 04/10/2020 18:13    Pending Labs Unresulted Labs (From admission, onward)          Start     Ordered   04/11/20 0500  CBC with Differential/Platelet  Daily,   R      04/10/20 2011   04/11/20 0500  C-reactive protein  Daily,   R      04/10/20 2011   04/11/20 0500  D-dimer, quantitative (not at [facility])  Daily,   R      04/10/20 2011   04/11/20 0500  Magnesium  Daily,   R      04/10/20 2011   04/10/20 9937  Basic metabolic panel  Now then every 4 hours,   R (with STAT occurrences)      04/10/20 2012   04/10/20 2012  Sodium, urine, random  Once,    STAT        04/10/20 2011   04/10/20 2012  Osmolality, urine  Once,   STAT        04/10/20 2011   04/10/20 2012  Osmolality  Once,   STAT        04/10/20 2011   04/10/20 1947  Procalcitonin - Baseline  Add-on,   AD        04/10/20 1946   04/10/20 1947  C-reactive protein  Add-on,   AD        04/10/20 1946   04/10/20 1947  D-dimer, quantitative  ONCE - STAT,   STAT        04/10/20 1946   04/10/20 1920  Lactic acid, plasma  (Septic presentation on arrival (screening labs, nursing and treatment orders for obvious sepsis))  Now then every 2 hours,   STAT      04/10/20 1920   04/10/20 1920  Protime-INR  (Septic presentation on arrival (screening labs, nursing and treatment orders for obvious sepsis))  ONCE - STAT,   STAT        04/10/20 1920   04/10/20 1920  APTT  (Septic presentation on arrival (screening labs, nursing and treatment orders for obvious sepsis))  ONCE - STAT,   STAT        04/10/20 1920   04/10/20 1920  Blood Culture (routine x 2)  (Septic presentation on arrival (screening labs, nursing and treatment orders for obvious sepsis))  BLOOD CULTURE X 2,   STAT      04/10/20 1920   04/10/20 1920  Urinalysis, Routine w reflex microscopic  (Septic presentation on arrival (screening labs, nursing and treatment orders for obvious sepsis))  ONCE - STAT,   STAT        04/10/20 1920   04/10/20 1920  Urine culture  (Septic presentation on arrival (screening labs, nursing and treatment orders for obvious sepsis))  ONCE - STAT,   STAT        04/10/20 1920   04/10/20 1909  Culture,  blood (single)  (Undifferentiated -> Now sepsis confirmed (treatment and sepsis specific nursing orders))  ONCE - STAT,   STAT        04/10/20 1909          Vitals/Pain Today's Vitals   04/10/20 1709 04/10/20 1710 04/10/20 1745 04/10/20 1815  BP: (!) 145/96  (!) 136/108 (!) 130/102  Pulse: (!) 107  (!) 103 (!) 106  Resp: (!) 25  (!) 26 (!) 29  Temp: 97.9 F (36.6 C)     TempSrc: Oral     SpO2: 97%  96%  99%  Weight:  76.2 kg    Height:  5\' 9"  (1.753 m)    PainSc:  0-No pain      Isolation Precautions Airborne and Contact precautions  Medications Medications  lactated ringers infusion (has no administration in time range)  cefTRIAXone (ROCEPHIN) 2 g in sodium chloride 0.9 % 100 mL IVPB (2 g Intravenous New Bag/Given 04/10/20 2011)  azithromycin (ZITHROMAX) 500 mg in sodium chloride 0.9 % 250 mL IVPB (has no administration in time range)  lactated ringers bolus 500 mL (has no administration in time range)  aspirin EC tablet 81 mg (has no administration in time range)  amLODipine (NORVASC) tablet 10 mg (has no administration in time range)  atorvastatin (LIPITOR) tablet 20 mg (has no administration in time range)  irbesartan (AVAPRO) tablet 300 mg (has no administration in time range)  enoxaparin (LOVENOX) injection 40 mg (has no administration in time range)  albuterol (VENTOLIN HFA) 108 (90 Base) MCG/ACT inhaler 2 puff (has no administration in time range)  guaiFENesin-dextromethorphan (ROBITUSSIN DM) 100-10 MG/5ML syrup 10 mL (has no administration in time range)  ascorbic acid (VITAMIN C) tablet 500 mg (has no administration in time range)  zinc sulfate capsule 220 mg (has no administration in time range)  acetaminophen (TYLENOL) tablet 650 mg (has no administration in time range)  polyethylene glycol (MIRALAX / GLYCOLAX) packet 17 g (has no administration in time range)  ondansetron (ZOFRAN) tablet 4 mg (has no administration in time range)    Or  ondansetron (ZOFRAN) injection 4 mg (has no administration in time range)  remdesivir 200 mg in sodium chloride 0.9% 250 mL IVPB (has no administration in time range)    Followed by  remdesivir 100 mg in sodium chloride 0.9 % 100 mL IVPB (has no administration in time range)  methylPREDNISolone sodium succinate (SOLU-MEDROL) 40 mg/mL injection 38 mg (has no administration in time range)    Followed by  predniSONE (DELTASONE) tablet 50 mg  (has no administration in time range)  albuterol (PROVENTIL) (2.5 MG/3ML) 0.083% nebulizer solution 2.5 mg (2.5 mg Nebulization Given 04/10/20 1809)  methylPREDNISolone sodium succinate (SOLU-MEDROL) 125 mg/2 mL injection 125 mg (125 mg Intravenous Given 04/10/20 1808)  iohexol (OMNIPAQUE) 350 MG/ML injection 100 mL (100 mLs Intravenous Contrast Given 04/10/20 1923)    Mobility walks with device Low fall risk   Focused Assessments\   Pulmonary Assessment Handoff:  Lung sounds: L Breath Sounds: Expiratory wheezes R Breath Sounds: Expiratory wheezes O2 Device: Room Air        R Recommendations: See Admitting Provider Note  Report given to:   Additional Notes:

## 2020-04-11 DIAGNOSIS — I10 Essential (primary) hypertension: Secondary | ICD-10-CM | POA: Diagnosis not present

## 2020-04-11 DIAGNOSIS — U071 COVID-19: Secondary | ICD-10-CM | POA: Diagnosis not present

## 2020-04-11 DIAGNOSIS — J441 Chronic obstructive pulmonary disease with (acute) exacerbation: Secondary | ICD-10-CM | POA: Diagnosis not present

## 2020-04-11 DIAGNOSIS — A419 Sepsis, unspecified organism: Secondary | ICD-10-CM | POA: Diagnosis not present

## 2020-04-11 LAB — BLOOD CULTURE ID PANEL (REFLEXED) - BCID2

## 2020-04-11 LAB — BLOOD GAS, VENOUS
Acid-Base Excess: 1.4 mmol/L (ref 0.0–2.0)
Bicarbonate: 25.5 mmol/L (ref 20.0–28.0)
FIO2: 21
O2 Saturation: 55.8 %
Patient temperature: 98.6
pCO2, Ven: 40.3 mmHg — ABNORMAL LOW (ref 44.0–60.0)
pH, Ven: 7.417 (ref 7.250–7.430)
pO2, Ven: 33.8 mmHg (ref 32.0–45.0)

## 2020-04-11 LAB — CBC WITH DIFFERENTIAL/PLATELET
Abs Immature Granulocytes: 0.76 10*3/uL — ABNORMAL HIGH (ref 0.00–0.07)
Basophils Absolute: 0.1 10*3/uL (ref 0.0–0.1)
Basophils Relative: 0 %
Eosinophils Absolute: 0.1 10*3/uL (ref 0.0–0.5)
Eosinophils Relative: 0 %
HCT: 33 % — ABNORMAL LOW (ref 39.0–52.0)
Hemoglobin: 11.5 g/dL — ABNORMAL LOW (ref 13.0–17.0)
Immature Granulocytes: 2 %
Lymphocytes Relative: 1 %
Lymphs Abs: 0.3 10*3/uL — ABNORMAL LOW (ref 0.7–4.0)
MCH: 30.3 pg (ref 26.0–34.0)
MCHC: 34.8 g/dL (ref 30.0–36.0)
MCV: 87.1 fL (ref 80.0–100.0)
Monocytes Absolute: 1.2 10*3/uL — ABNORMAL HIGH (ref 0.1–1.0)
Monocytes Relative: 3 %
Neutro Abs: 31.4 10*3/uL — ABNORMAL HIGH (ref 1.7–7.7)
Neutrophils Relative %: 94 %
Platelets: 493 10*3/uL — ABNORMAL HIGH (ref 150–400)
RBC: 3.79 MIL/uL — ABNORMAL LOW (ref 4.22–5.81)
RDW: 13.6 % (ref 11.5–15.5)
WBC: 33.8 10*3/uL — ABNORMAL HIGH (ref 4.0–10.5)
nRBC: 0 % (ref 0.0–0.2)

## 2020-04-11 LAB — D-DIMER, QUANTITATIVE: D-Dimer, Quant: 2.86 ug/mL-FEU — ABNORMAL HIGH (ref 0.00–0.50)

## 2020-04-11 LAB — C-REACTIVE PROTEIN: CRP: 31.7 mg/dL — ABNORMAL HIGH (ref <1.0)

## 2020-04-11 LAB — BASIC METABOLIC PANEL
Anion gap: 14 (ref 5–15)
Anion gap: 13 (ref 5–15)
BUN: 18 mg/dL (ref 8–23)
BUN: 20 mg/dL (ref 8–23)
CO2: 22 mmol/L (ref 22–32)
CO2: 21 mmol/L — ABNORMAL LOW (ref 22–32)
Calcium: 8.6 mg/dL — ABNORMAL LOW (ref 8.9–10.3)
Calcium: 8.8 mg/dL — ABNORMAL LOW (ref 8.9–10.3)
Chloride: 85 mmol/L — ABNORMAL LOW (ref 98–111)
Chloride: 88 mmol/L — ABNORMAL LOW (ref 98–111)
Creatinine, Ser: 0.72 mg/dL (ref 0.61–1.24)
Creatinine, Ser: 0.89 mg/dL (ref 0.61–1.24)
GFR, Estimated: >60 mL/min (ref >60)
GFR, Estimated: >60 mL/min (ref >60)
Glucose, Bld: 195 mg/dL — ABNORMAL HIGH (ref 70–99)
Glucose, Bld: 184 mg/dL — ABNORMAL HIGH (ref 70–99)
Potassium: 3.8 mmol/L (ref 3.5–5.1)
Potassium: 3.9 mmol/L (ref 3.5–5.1)
Sodium: 121 mmol/L — ABNORMAL LOW (ref 135–145)
Sodium: 122 mmol/L — ABNORMAL LOW (ref 135–145)

## 2020-04-11 LAB — PROCALCITONIN: Procalcitonin: 1.34 ng/mL

## 2020-04-11 LAB — OSMOLALITY: Osmolality: 263 mOsm/kg — ABNORMAL LOW (ref 275–295)

## 2020-04-11 LAB — SODIUM, URINE, RANDOM: Sodium, Ur: <10 mmol/L

## 2020-04-11 LAB — MAGNESIUM: Magnesium: 2 mg/dL (ref 1.7–2.4)

## 2020-04-11 LAB — OSMOLALITY, URINE: Osmolality, Ur: 304 mOsm/kg (ref 300–900)

## 2020-04-11 NOTE — Progress Notes (Signed)
PROGRESS NOTE   Terry Page.  ZHG:[id number] DOB: 1955/06/28 DOA: 04/10/2020 PCP: [name], MD   Brief Narrative:  65 year old male with past medical history of COPD with both emphysema, spontaneous pneumothorax (2019 and 2021), SIADH, hypertension, hyperlipidemia, prostate cancer and left lung cancer (currently receiving radiation therapy) who presents to [facility] emergency department with complaints of shortness of breath. Patient symptoms of shortness of breath cough and associated weakness continued to persist overnight and he eventually presented to [facility] emergency department for evaluation. Upon evaluation in the emergency department, she was found to be in respiratory distress but not hypoxic.  COVID-19 PCR testing was found to be positive.  Patient was additionally found to have extensive patchy infiltrates in the right upper lobe concerning for pneumonia with a small right pleural effusion.  There was no evidence of pulmonary embolism.  Sodium was found to be 119.  Patient also found to have multiple SIRS criteria including substantial leukocytosis of 36.3 and tachycardia concerning for developing sepsis.  Patient was initiated on intravenous ceftriaxone and azithromycin.  The hospitalist group was then called to assess the patient for admission to the hospital.     Assessment & Plan:   Principal Problem:   Sepsis ([place]) Active Problems:   COPD with acute exacerbation ([place])   COVID-19 virus infection   Primary cancer of left upper lobe of lung ([place])   Pneumonia of right upper lobe due to infectious organism   SIADH (syndrome of inappropriate ADH production) ([place])   Essential hypertension   Mixed hyperlipidemia   Sepsis, multifactorial secondary to right upper lobe CAP Acute hypoxic respiratory failure Cannot rule out concurrent COVID-19 infection Cannot rule out concurrent COPD exacerbation -Right upper lobe infiltrate on imaging concerning  for bacterial pneumonia -continue ceftriaxone/azithromycin for 5 days -Covid 19 PCR positive but unclear if acutely infected, continue Remdesivir x5 days, steroids per protocol - Not performing 30 cc/kg due to presence of SIADH with sodium levels currently 119 and patient being near euvolemic. - Blood cultures have been obtained and are pending.  Patient currently is not producing sputum and therefore sputum culture has not been ordered.  COPD with acute exacerbation ([place]) with hypoxia -Patient has known history of substantial COPD with bullous emphysema, suffering 2 spontaneous pneumothoraces in the past. -Initially noted to have profound expiratory wheeze and prolonged expiratory phase at intake, improving with steroids and supportive care -No current indication for positive pressure ventilation given drastic improvement in symptoms  SIADH (syndrome of inappropriate ADH production) ([place]) - Improving -with fluid restriction -Patient did not receive sepsis fluid bolus protocol in the setting of SIADH -Sodium trough 118 at intake, 122 this morning  Primary cancer of left upper lobe of lung ([place]) - Spiculated lung mass initially identified during hospitalization in 12/2017- initially managed with radiation therapy 04/2018 -Patient has unfortunately developed another area concerning for left upper lobe lung malignancy, confirmed via PET imaging performed on 2/4 - Patient is now once again receiving radiation therapy - Presence of lung malignancy place patient at risk of rapid clinical decompensation.  History of prostate cancer Status post radiation therapy  Essential hypertension Continue home regimen of antihypertensive therapy as blood pressure tolerates  Mixed hyperlipidemia Continue home meds, low-fat diet   DVT prophylaxis: Lovenox Code Status:  Full code Family Communication: deferred   Status is: Inpatient  Dispo: The patient is from: Home              Anticipated d/c  is to: Home              Anticipated d/c date is: >48/72h              Patient currently not medically stable for discharge given acute respiratory failure, hypoxia need for ongoing IV Remdesivir, supplemental oxygen as well as IV antibiotics.  Consultants:   None  Procedures:   None  Antimicrobials:  Azithromycin/ceftriaxone x5 days Remdesivir x5 days  Subjective: No acute issues or events this morning, respiratory status improving, patient feels markedly improved but not yet back to baseline.  Denies nausea vomiting diarrhea constipation headache fevers or chills.  Objective: Vitals:   04/11/20 0100 04/11/20 0231 04/11/20 0430 04/11/20 0547  BP:  (!) 137/96    Pulse:  (!) 106    Resp:  18    Temp:  98.3 F (36.8 C)    TempSrc:  Oral    SpO2: 96% 97% 95% 97%  Weight:      Height:        Intake/Output Summary (Last 24 hours) at 04/11/2020 0742 Last data filed at 04/11/2020 0600 Gross per 24 hour  Intake 535.45 ml  Output 550 ml  Net -14.55 ml   Filed Weights   04/10/20 1710 04/10/20 2120  Weight: 76.2 kg 76.2 kg    Examination:  General:  Pleasantly resting in bed, No acute distress. HEENT:  Normocephalic atraumatic.  Sclerae nonicteric, noninjected.  Extraocular movements intact bilaterally. Neck:  Without mass or deformity.  Trachea is midline. Lungs: Without overt rales or rhonchi, scant end expiratory wheeze noted bilaterally Heart:  Regular rate and rhythm.  Without murmurs, rubs, or gallops. Abdomen:  Soft, nontender, nondistended.  Without guarding or rebound. Extremities: Without cyanosis, clubbing, edema, or obvious deformity. Vascular:  Dorsalis pedis and posterior tibial pulses palpable bilaterally. Skin:  Warm and dry, no erythema, no ulcerations.  Data Reviewed: I have personally reviewed following labs and imaging studies  CBC: Recent Labs  Lab 04/10/20 1725 04/11/20 0202  WBC 36.3* 33.8*  NEUTROABS 32.1* 31.4*  HGB 11.7* 11.5*  HCT 34.1*  33.0*  MCV 88.1 87.1  PLT 502* 165*   Basic Metabolic Panel: Recent Labs  Lab 04/10/20 1725 04/10/20 2153 04/11/20 0202  NA 119* 118* 121*  K 4.0 3.9 3.8  CL 84* 85* 85*  CO2 22 19* 22  GLUCOSE 142* 159* 195*  BUN 19 18 18   CREATININE 0.93 0.65 0.72  CALCIUM 8.8* 8.6* 8.6*  MG  --   --  2.0   GFR: Estimated Creatinine Clearance: 90.8 mL/min (by C-G formula based on SCr of 0.72 mg/dL). Liver Function Tests: Recent Labs  Lab 04/10/20 1725  AST 37  ALT 35  ALKPHOS 75  BILITOT 1.4*  PROT 7.9  ALBUMIN 3.0*   No results for input(s): LIPASE, AMYLASE in the last 168 hours. No results for input(s): AMMONIA in the last 168 hours. Coagulation Profile: Recent Labs  Lab 04/10/20 1801  INR 1.3*   Cardiac Enzymes: No results for input(s): CKTOTAL, CKMB, CKMBINDEX, TROPONINI in the last 168 hours. BNP (last 3 results) No results for input(s): PROBNP in the last 8760 hours. HbA1C: No results for input(s): HGBA1C in the last 72 hours. CBG: No results for input(s): GLUCAP in the last 168 hours. Lipid Profile: No results for input(s): CHOL, HDL, LDLCALC, TRIG, CHOLHDL, LDLDIRECT in the last 72 hours. Thyroid Function Tests: No results for input(s): TSH, T4TOTAL, FREET4, T3FREE, THYROIDAB in the last 72 hours. Anemia Panel:  No results for input(s): VITAMINB12, FOLATE, FERRITIN, TIBC, IRON, RETICCTPCT in the last 72 hours. Sepsis Labs: Recent Labs  Lab 04/10/20 1801 04/10/20 2020 04/10/20 2153  PROCALCITON 1.34  --   --   LATICACIDVEN  --  1.6 1.6    Recent Results (from the past 240 hour(s))  Resp Panel by RT-PCR (Flu A&B, Covid) Nasopharyngeal Swab     Status: Abnormal   Collection Time: 04/10/20  5:35 PM   Specimen: Nasopharyngeal Swab; Nasopharyngeal(NP) swabs in vial transport medium  Result Value Ref Range Status   SARS Coronavirus 2 by RT PCR POSITIVE (A) NEGATIVE Final    Comment: RESULT CALLED TO, READ BACK BY AND VERIFIED WITH: JOHN FRICKUY RN04/09/22  @1916  BY P.HENDERSON (NOTE) SARS-CoV-2 target nucleic acids are DETECTED.  The SARS-CoV-2 RNA is generally detectable in upper respiratory specimens during the acute phase of infection. Positive results are indicative of the presence of the identified virus, but do not rule out bacterial infection or co-infection with other pathogens not detected by the test. Clinical correlation with patient history and other diagnostic information is necessary to determine patient infection status. The expected result is Negative.  Fact Sheet for Patients: EntrepreneurPulse.com.au  Fact Sheet for Healthcare Providers: IncredibleEmployment.be  This test is not yet approved or cleared by the [place] FDA and  has been authorized for detection and/or diagnosis of SARS-CoV-2 by FDA under an Emergency Use Authorization (EUA).  This EUA will remain in effect (meaning this  test can be used) for the duration of  the COVID-19 declaration under Section 564(b)(1) of the Act, 21 U.S.C. section 360bbb-3(b)(1), unless the authorization is terminated or revoked sooner.     Influenza A by PCR NEGATIVE NEGATIVE Final   Influenza B by PCR NEGATIVE NEGATIVE Final    Comment: (NOTE) The Xpert Xpress SARS-CoV-2/FLU/RSV plus assay is intended as an aid in the diagnosis of influenza from Nasopharyngeal swab specimens and should not be used as a sole basis for treatment. Nasal washings and aspirates are unacceptable for Xpert Xpress SARS-CoV-2/FLU/RSV testing.  Fact Sheet for Patients: EntrepreneurPulse.com.au  Fact Sheet for Healthcare Providers: IncredibleEmployment.be  This test is not yet approved or cleared by the [place] FDA and has been authorized for detection and/or diagnosis of SARS-CoV-2 by FDA under an Emergency Use Authorization (EUA). This EUA will remain in effect (meaning this test can be used) for the duration of  the COVID-19 declaration under Section 564(b)(1) of the Act, 21 U.S.C. section 360bbb-3(b)(1), unless the authorization is terminated or revoked.  Performed at [facility], [place] [address], [place], [place] [address]          Radiology Studies: CT Angio Chest PE W and/or Wo Contrast  Result Date: 04/10/2020 CLINICAL DATA:  Cough and shortness of breath. Leukocytosis. History of lung cancer with radiation therapy. History of prostate cancer. History of smoking. EXAM: CT ANGIOGRAPHY CHEST WITH CONTRAST TECHNIQUE: Multidetector CT imaging of the chest was performed using the standard protocol during bolus administration of intravenous contrast. Multiplanar CT image reconstructions and MIPs were obtained to evaluate the vascular anatomy. CONTRAST:  133mL OMNIPAQUE IOHEXOL 350 MG/ML SOLN COMPARISON:  Portable chest obtained earlier today. Chest CT dated 01/07/2020. PET-CT dated 02/06/2020. FINDINGS: Cardiovascular: Normally opacified pulmonary arteries with no pulmonary arterial filling defects seen. Atheromatous calcifications, including the coronary arteries and aorta. Mediastinum/Nodes: Interval mildly enlarged precarinal node with a short axis diameter 12 mm on image number 63/4. Interval mildly enlarged subcarinal node with a short  axis diameter of 9 mm on image number 74/4. Interval mildly enlarged right hilar node with a short axis diameter of 7 mm on image number 78/4. Unremarkable thyroid gland and esophagus. Lungs/Pleura: Interval extensive patchy and confluent airspace opacity in the right upper lobe. There is also an interval small amount of pleural fluid medially in the right upper and lower chest. Interval nodular density in the medial aspect of the right upper lobe measuring 1.2 x 1.1 cm on image number 80/6. There is also an interval nodular density in the medial aspect of the right lower lobe, measuring 1.5 x 1.3 cm on image number 73/6. An irregular, spiculated left  upper lobe nodule is again demonstrated. This measures 1.2 x 0.9 cm on image number 51/6. This measured 1.3 x 1.1 cm on 02/06/2020. Extensive bilateral bullous changes are again demonstrated. There is an interval 2.1 x 2.0 cm nodular area adjacent to the posterior aspect of a bullous in the lingula with no significant change in a smaller, similar area nearby, slightly more superiorly and laterally on image number 77/6. There is also an interval nodular density in the left lower lobe measuring 2.6 x 1.5 cm on image number 102/6. Upper Abdomen: Diffuse low density of the liver relative to the spleen. Stable atrophic splenic remnant. Partially included right renal cysts. Musculoskeletal: Review of the MIP images confirms the above findings. IMPRESSION: 1. Interval extensive patchy and confluent airspace opacity in the right upper lobe, compatible with pneumonia. 2. Interval small amount of right pleural fluid. 3. Interval mild mediastinal and right hilar adenopathy, most likely reactive. 4. Interval nodular densities in both lungs, as described above. These are concerning for possible metastases. 5. Interval slight decrease in size of a small spiculated nodule in the left upper lobe compatible with a metastasis or additional primary lung carcinoma. 6. 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 7. Diffuse hepatic steatosis. 8. Calcific coronary artery and aortic atherosclerosis. 9.  Calcific coronary artery and aortic atherosclerosis. Aortic Atherosclerosis (ICD10-I70.0) and Emphysema (ICD10-J43.9). Electronically Signed   By: Claudie Revering M.D.   On: 04/10/2020 20:01   DG Chest Portable 1 View  Result Date: 04/10/2020 CLINICAL DATA:  Cough, shortness of breath, history of bilateral lung cancer EXAM: PORTABLE CHEST 1 VIEW COMPARISON:  Chest radiographs, 06/13/2019, CT chest, 01/06/2018 FINDINGS: The heart size and mediastinal contours are within normal limits. Heterogeneous airspace opacity of the peripheral  right upper lobe. Bandlike scarring of the left midlung and spiculated nodule of the central left upper lobe. The visualized skeletal structures are unremarkable. IMPRESSION: 1. Heterogeneous airspace opacity of the peripheral right upper lobe, consistent with infection. 2. Bandlike scarring of the left midlung and spiculated nodule of the central left upper lobe, better assessed by prior CT. Electronically Signed   By: Eddie Candle M.D.   On: 04/10/2020 18:13        Scheduled Meds: . amLODipine  10 mg Oral Daily  . vitamin C  500 mg Oral Daily  . aspirin EC  81 mg Oral Daily  . atorvastatin  20 mg Oral Daily  . enoxaparin (LOVENOX) injection  40 mg Subcutaneous Q24H  . irbesartan  300 mg Oral Daily  . methylPREDNISolone (SOLU-MEDROL) injection  0.5 mg/kg Intravenous Q12H   Followed by  . [START ON 04/14/2020] predniSONE  50 mg Oral Daily  . zinc sulfate  220 mg Oral Daily   Continuous Infusions: . sodium chloride Stopped (04/11/20 0430)  . azithromycin 500 mg (04/10/20 2218)  .  cefTRIAXone (ROCEPHIN)  IV    . lactated ringers    . remdesivir 100 mg in NS 100 mL       LOS: 0 days   Time spent: 46min  Kylian Loh C Llesenia Fogal, DO [facility]  If 7PM-7AM, please contact night-coverage [email or web address]  04/11/2020, 7:42 AM

## 2020-04-12 DIAGNOSIS — T17908A Unspecified foreign body in respiratory tract, part unspecified causing other injury, initial encounter: Secondary | ICD-10-CM | POA: Diagnosis not present

## 2020-04-12 DIAGNOSIS — R109 Unspecified abdominal pain: Secondary | ICD-10-CM | POA: Diagnosis not present

## 2020-04-12 DIAGNOSIS — A419 Sepsis, unspecified organism: Secondary | ICD-10-CM | POA: Diagnosis not present

## 2020-04-12 DIAGNOSIS — J1520 Pneumonia due to staphylococcus, unspecified: Secondary | ICD-10-CM | POA: Diagnosis present

## 2020-04-12 DIAGNOSIS — R1084 Generalized abdominal pain: Secondary | ICD-10-CM | POA: Diagnosis not present

## 2020-04-12 DIAGNOSIS — J189 Pneumonia, unspecified organism: Secondary | ICD-10-CM

## 2020-04-12 DIAGNOSIS — J850 Gangrene and necrosis of lung: Secondary | ICD-10-CM | POA: Diagnosis not present

## 2020-04-12 DIAGNOSIS — G8929 Other chronic pain: Secondary | ICD-10-CM | POA: Diagnosis present

## 2020-04-12 DIAGNOSIS — U071 COVID-19: Secondary | ICD-10-CM | POA: Diagnosis present

## 2020-04-12 DIAGNOSIS — C3412 Malignant neoplasm of upper lobe, left bronchus or lung: Secondary | ICD-10-CM | POA: Diagnosis present

## 2020-04-12 DIAGNOSIS — I10 Essential (primary) hypertension: Secondary | ICD-10-CM | POA: Diagnosis present

## 2020-04-12 DIAGNOSIS — R6521 Severe sepsis with septic shock: Secondary | ICD-10-CM | POA: Diagnosis present

## 2020-04-12 DIAGNOSIS — E43 Unspecified severe protein-calorie malnutrition: Secondary | ICD-10-CM | POA: Diagnosis present

## 2020-04-12 DIAGNOSIS — J439 Emphysema, unspecified: Secondary | ICD-10-CM | POA: Diagnosis present

## 2020-04-12 DIAGNOSIS — E876 Hypokalemia: Secondary | ICD-10-CM | POA: Diagnosis not present

## 2020-04-12 DIAGNOSIS — M47816 Spondylosis without myelopathy or radiculopathy, lumbar region: Secondary | ICD-10-CM | POA: Diagnosis present

## 2020-04-12 DIAGNOSIS — R1310 Dysphagia, unspecified: Secondary | ICD-10-CM | POA: Diagnosis not present

## 2020-04-12 DIAGNOSIS — J181 Lobar pneumonia, unspecified organism: Secondary | ICD-10-CM | POA: Diagnosis not present

## 2020-04-12 DIAGNOSIS — J441 Chronic obstructive pulmonary disease with (acute) exacerbation: Secondary | ICD-10-CM

## 2020-04-12 DIAGNOSIS — B958 Unspecified staphylococcus as the cause of diseases classified elsewhere: Secondary | ICD-10-CM

## 2020-04-12 DIAGNOSIS — R0602 Shortness of breath: Secondary | ICD-10-CM | POA: Diagnosis present

## 2020-04-12 DIAGNOSIS — K567 Ileus, unspecified: Secondary | ICD-10-CM | POA: Diagnosis not present

## 2020-04-12 DIAGNOSIS — K56609 Unspecified intestinal obstruction, unspecified as to partial versus complete obstruction: Secondary | ICD-10-CM | POA: Diagnosis not present

## 2020-04-12 DIAGNOSIS — Y95 Nosocomial condition: Secondary | ICD-10-CM | POA: Diagnosis present

## 2020-04-12 DIAGNOSIS — E222 Syndrome of inappropriate secretion of antidiuretic hormone: Secondary | ICD-10-CM

## 2020-04-12 DIAGNOSIS — R7881 Bacteremia: Secondary | ICD-10-CM

## 2020-04-12 DIAGNOSIS — Z6824 Body mass index (BMI) 24.0-24.9, adult: Secondary | ICD-10-CM | POA: Diagnosis not present

## 2020-04-12 DIAGNOSIS — R1033 Periumbilical pain: Secondary | ICD-10-CM | POA: Diagnosis not present

## 2020-04-12 DIAGNOSIS — J9601 Acute respiratory failure with hypoxia: Secondary | ICD-10-CM | POA: Diagnosis present

## 2020-04-12 DIAGNOSIS — I700 Atherosclerosis of aorta: Secondary | ICD-10-CM | POA: Diagnosis present

## 2020-04-12 DIAGNOSIS — D638 Anemia in other chronic diseases classified elsewhere: Secondary | ICD-10-CM | POA: Diagnosis present

## 2020-04-12 DIAGNOSIS — B957 Other staphylococcus as the cause of diseases classified elsewhere: Secondary | ICD-10-CM | POA: Diagnosis not present

## 2020-04-12 DIAGNOSIS — J1282 Pneumonia due to coronavirus disease 2019: Secondary | ICD-10-CM | POA: Diagnosis present

## 2020-04-12 DIAGNOSIS — E782 Mixed hyperlipidemia: Secondary | ICD-10-CM | POA: Diagnosis present

## 2020-04-12 DIAGNOSIS — J984 Other disorders of lung: Secondary | ICD-10-CM | POA: Diagnosis not present

## 2020-04-12 DIAGNOSIS — A411 Sepsis due to other specified staphylococcus: Secondary | ICD-10-CM | POA: Diagnosis present

## 2020-04-12 DIAGNOSIS — Z809 Family history of malignant neoplasm, unspecified: Secondary | ICD-10-CM | POA: Diagnosis not present

## 2020-04-12 LAB — CBC WITH DIFFERENTIAL/PLATELET
Abs Immature Granulocytes: 1.44 10*3/uL — ABNORMAL HIGH (ref 0.00–0.07)
Basophils Absolute: 0.2 10*3/uL — ABNORMAL HIGH (ref 0.0–0.1)
Basophils Relative: 0 %
Eosinophils Absolute: 0 10*3/uL (ref 0.0–0.5)
Eosinophils Relative: 0 %
HCT: 32.8 % — ABNORMAL LOW (ref 39.0–52.0)
Hemoglobin: 11.1 g/dL — ABNORMAL LOW (ref 13.0–17.0)
Immature Granulocytes: 4 %
Lymphocytes Relative: 1 %
Lymphs Abs: 0.3 10*3/uL — ABNORMAL LOW (ref 0.7–4.0)
MCH: 29.9 pg (ref 26.0–34.0)
MCHC: 33.8 g/dL (ref 30.0–36.0)
MCV: 88.4 fL (ref 80.0–100.0)
Monocytes Absolute: 1.3 10*3/uL — ABNORMAL HIGH (ref 0.1–1.0)
Monocytes Relative: 3 %
Neutro Abs: 33.4 10*3/uL — ABNORMAL HIGH (ref 1.7–7.7)
Neutrophils Relative %: 92 %
Platelets: 529 10*3/uL — ABNORMAL HIGH (ref 150–400)
RBC: 3.71 MIL/uL — ABNORMAL LOW (ref 4.22–5.81)
RDW: 13.7 % (ref 11.5–15.5)
WBC: 36.5 10*3/uL — ABNORMAL HIGH (ref 4.0–10.5)
nRBC: 0 % (ref 0.0–0.2)

## 2020-04-12 LAB — URINE CULTURE: Culture: <10000 — AB

## 2020-04-12 LAB — BASIC METABOLIC PANEL
Anion gap: 12 (ref 5–15)
BUN: 27 mg/dL — ABNORMAL HIGH (ref 8–23)
CO2: 24 mmol/L (ref 22–32)
Calcium: 8.4 mg/dL — ABNORMAL LOW (ref 8.9–10.3)
Chloride: 88 mmol/L — ABNORMAL LOW (ref 98–111)
Creatinine, Ser: 0.76 mg/dL (ref 0.61–1.24)
GFR, Estimated: >60 mL/min (ref >60)
Glucose, Bld: 173 mg/dL — ABNORMAL HIGH (ref 70–99)
Potassium: 3.4 mmol/L — ABNORMAL LOW (ref 3.5–5.1)
Sodium: 124 mmol/L — ABNORMAL LOW (ref 135–145)

## 2020-04-12 LAB — C-REACTIVE PROTEIN: CRP: 23.6 mg/dL — ABNORMAL HIGH (ref <1.0)

## 2020-04-12 LAB — MAGNESIUM: Magnesium: 2.3 mg/dL (ref 1.7–2.4)

## 2020-04-12 LAB — D-DIMER, QUANTITATIVE: D-Dimer, Quant: 2.8 ug/mL-FEU — ABNORMAL HIGH (ref 0.00–0.50)

## 2020-04-12 MED ORDER — VANCOMYCIN HCL 1250 MG/250ML IV SOLN
1250.0000 mg | Freq: Two times a day (BID) | INTRAVENOUS | Status: DC
  Administered 2020-04-13 – 2020-04-15 (×5): 1250 mg via INTRAVENOUS
  Filled 2020-04-12 (×5): qty 250

## 2020-04-12 MED ORDER — VANCOMYCIN HCL 1500 MG/300ML IV SOLN
1500.0000 mg | Freq: Once | INTRAVENOUS | Status: AC
  Administered 2020-04-12: 1500 mg via INTRAVENOUS
  Filled 2020-04-12: qty 300

## 2020-04-12 NOTE — Progress Notes (Signed)
PHARMACY - PHYSICIAN COMMUNICATION CRITICAL VALUE ALERT - BLOOD CULTURE IDENTIFICATION (BCID)  Terry Vaile. is an 65 y.o. male who presented to [facility] on 04/10/2020 with a chief complaint of SOB  Assessment:  GPC 3/3. Staph species  Name of physician (or Provider) Contacted: [place]  Current antibiotics: azith, CTX, Remdesivir  Changes to prescribed antibiotics recommended:  Consider starting vancomycin if concerns for infection vs contaminant  Results for orders placed or performed during the hospital encounter of 04/10/20  Blood Culture ID Panel (Reflexed) (Collected: 04/10/2020  8:53 PM)  Result Value Ref Range   Enterococcus faecalis NOT DETECTED NOT DETECTED   Enterococcus Faecium NOT DETECTED NOT DETECTED   Listeria monocytogenes NOT DETECTED NOT DETECTED   Staphylococcus species DETECTED (A) NOT DETECTED   Staphylococcus aureus (BCID) NOT DETECTED NOT DETECTED   Staphylococcus epidermidis NOT DETECTED NOT DETECTED   Staphylococcus lugdunensis NOT DETECTED NOT DETECTED   Streptococcus species NOT DETECTED NOT DETECTED   Streptococcus agalactiae NOT DETECTED NOT DETECTED   Streptococcus pneumoniae NOT DETECTED NOT DETECTED   Streptococcus pyogenes NOT DETECTED NOT DETECTED   A.calcoaceticus-baumannii NOT DETECTED NOT DETECTED   Bacteroides fragilis NOT DETECTED NOT DETECTED   Enterobacterales NOT DETECTED NOT DETECTED   Enterobacter cloacae complex NOT DETECTED NOT DETECTED   Escherichia coli NOT DETECTED NOT DETECTED   Klebsiella aerogenes NOT DETECTED NOT DETECTED   Klebsiella oxytoca NOT DETECTED NOT DETECTED   Klebsiella pneumoniae NOT DETECTED NOT DETECTED   Proteus species NOT DETECTED NOT DETECTED   Salmonella species NOT DETECTED NOT DETECTED   Serratia marcescens NOT DETECTED NOT DETECTED   Haemophilus influenzae NOT DETECTED NOT DETECTED   Neisseria meningitidis NOT DETECTED NOT DETECTED   Pseudomonas aeruginosa NOT DETECTED NOT DETECTED    Stenotrophomonas maltophilia NOT DETECTED NOT DETECTED   Candida albicans NOT DETECTED NOT DETECTED   Candida auris NOT DETECTED NOT DETECTED   Candida glabrata NOT DETECTED NOT DETECTED   Candida krusei NOT DETECTED NOT DETECTED   Candida parapsilosis NOT DETECTED NOT DETECTED   Candida tropicalis NOT DETECTED NOT DETECTED   Cryptococcus neoformans/gattii NOT DETECTED NOT DETECTED    Dolly Rias RPh 04/12/2020, 1:14 AM

## 2020-04-12 NOTE — Progress Notes (Signed)
Pharmacy Antibiotic Note  Terry Page. is a 65 y.o. male admitted on 04/10/2020 with COVID-19 pneumonia, CAP and now with +blood cultures Staph hominis.  Pharmacy has been consulted for vancomycin dosing.  Plan: Vancomycin 1500mg  IV x 1, then 1250mg  IV q12h for estimated AUC 545, Cmin 15 using SCr 0.74, Vd 0.72 Plan to check vancomycin levels at steady state, goal AUC 400-550 Follow up renal function & cultures  Height: 5\' 9"  (175.3 cm) Weight: 76.2 kg (168 lb) IBW/kg (Calculated) : 70.7  Temp (24hrs), Avg:97.8 F (36.6 C), Min:97.4 F (36.3 C), Max:98.3 F (36.8 C)  Recent Labs  Lab 04/10/20 1725 04/10/20 2020 04/10/20 2153 04/11/20 0202 04/11/20 0726 04/12/20 0324  WBC 36.3*  --   --  33.8*  --  36.5*  CREATININE 0.93  --  0.65 0.72 0.89 0.76  LATICACIDVEN  --  1.6 1.6  --   --   --     Estimated Creatinine Clearance: 90.8 mL/min (by C-G formula based on SCr of 0.76 mg/dL).    No Known Allergies  Antimicrobials this admission: 4/9 CTX >> 4/9 Azithro >> 4/9 Remdesivir >> 4/11 Vancomycin >>  Dose adjustments this admission:  Microbiology results: 4/9 BCx: 3 of 4 bottles Staph hominis 4/9 UCx: <10k insignificant growth 4/9 COVID+  Thank you for allowing pharmacy to be a part of this patient's care.  Peggyann Juba, PharmD, BCPS Pharmacy: [phone] 04/12/2020 1:42 PM

## 2020-04-12 NOTE — Consult Note (Signed)
[facility]    Date of Admission:  04/10/2020     Reason for Consult: Staph hominis bacteremia     Referring Physician: Dr Avon Gully  Current antibiotics: Ceftriaxone/Azithromycin 4/9--present Vancomycin 4/11--present Remdesivir 4/9--present  Previous antibiotics: None   ASSESSMENT:    Staph hominis high grade bacteremia: Unclear source as patient lacks any indwelling central lines, implanted cardiac devices, or prosthetic joints/hardware but 2 of 3 sets are positive which would indicate a true pathogen Right upper lobe pneumonia: Being treated for CAP with ceftriaxone and azithromycin.   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 CTA Chest also with nodular densities in both lungs concerning for possible metastases  PLAN:    Continue vancomycin per pharmacy given high-grade staph hominis bacteremia TTE Repeat blood cultures in the morning Continue ceftriaxone and azithromycin for CAP coverage Covid precautions per hospital policy Covid treatment per primary team Will follow   Principal Problem:   Sepsis ([place]) Active Problems:   COPD with acute exacerbation ([place])   COVID-19 virus infection   Primary cancer of left upper lobe of lung ([place])   Pneumonia of right upper lobe due to infectious organism   SIADH (syndrome of inappropriate ADH production) ([place])   Essential hypertension   Mixed hyperlipidemia   Acute respiratory failure with hypoxia ([place])   MEDICATIONS:    Scheduled Meds: . amLODipine  10 mg Oral Daily  . vitamin C  500 mg Oral  Daily  . aspirin EC  81 mg Oral Daily  . atorvastatin  20 mg Oral Daily  . enoxaparin (LOVENOX) injection  40 mg Subcutaneous Q24H  . irbesartan  300 mg Oral Daily  . methylPREDNISolone (SOLU-MEDROL) injection  0.5 mg/kg Intravenous Q12H   Followed by  . [START ON 04/14/2020] predniSONE  50 mg Oral Daily  . zinc sulfate  220 mg Oral Daily   Continuous Infusions: . sodium chloride Stopped (04/11/20 0430)  . azithromycin Stopped (04/11/20 2227)  . cefTRIAXone (ROCEPHIN)  IV Stopped (04/11/20 2034)  . lactated ringers    . remdesivir 100 mg in NS 100 mL 100 mg (04/12/20 1101)  . [START ON 04/13/2020] vancomycin     PRN Meds:.sodium chloride, acetaminophen, albuterol, guaiFENesin-dextromethorphan, hydrALAZINE, ondansetron **OR** ondansetron (ZOFRAN) IV, polyethylene glycol  HPI:    Terry Page. is a 65 y.o. male with a past medical history of COPD, spontaneous pneumothorax (2019 and 2021), SIADH, hypertension, hyperlipidemia, prostate cancer, left lung cancer (currently receiving radiation therapy) who presented to [facility] on 04/10/2020 with chief complaint of shortness of breath, cough, and associated weakness that began abruptly 1 day prior to admission and progressed with minimal exertion and would improve with rest.  Terry Page also complained of nonproductive cough and denied any fevers, lower extremity edema, paroxysmal nocturnal dyspnea, or pillow orthopnea.  Terry Page also denied any significant myalgias or lack of taste or smell.  Upon evaluation in the emergency department patient was found to be in respiratory distress but was not hypoxic.  His COVID-19 PCR was found to be positive and imaging revealed extensive patchy infiltrates in the right upper lobe concerning for pneumonia with a  small right pleural effusion.  Terry Page was hyponatremic with a sodium of 119, leukocytosis 36.3.  Terry Page was started on ceftriaxone and azithromycin for CAP coverage as well as remdesivir and steroids.  Patient's  admission blood cultures from 04/10/2020 are positive for Staph hominis in 2 out of 3 blood culture sets with susceptibilities pending at this time.  Vancomycin was added today given this high-grade bacteremia and Terry Page was continued on ceftriaxone and azithromycin as well.  There is no obvious source of his bacteremia as Terry Page has no indwelling central lines, no implanted cardiac devices, and no prosthetic joints or hardware.  Over the last 24 hours Terry Page remains afebrile, on nasal cannula 3 L (does not require O2 at home), has been normotensive, and not tachycardic.  His sodium has slowly started to improve and his leukocytosis remains significantly elevated at 36.5.  We have been consulted for further antibiotic recommendations.   Past Medical History:  Diagnosis Date  . Alcohol abuse   . Asthma   . 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 Essential hypertension 04/10/2020  . Hemorrhoids   . Incidental lung nodule, greater than or equal to 52mm 10/22/2017   Left upper lobe - discovered on CTA  . Prostate cancer ([place])   . Spontaneous pneumothorax 10/20/2017   right  . Tobacco abuse     Social History   Tobacco Use  . Smoking status: Former Smoker    Types: Cigarettes    Quit date: 11/22/2019    Years since quitting: 0.3  . Smokeless tobacco: Never Used  Vaping Use  . Vaping Use: Never used  Substance Use Topics  . Alcohol use: Yes    Alcohol/week: 12.0 standard drinks    Types: 12 Cans of beer per week    Comment: daily  . Drug use: No    Family History  Problem Relation Age of Onset  . Cancer Cousin        maternal cousin  . Cancer Cousin        paternal cousin  . Cancer Cousin   . Colon polyps Neg Hx   . Pancreatic disease Neg Hx   . Pancreatic cancer Neg Hx   . Breast cancer Neg Hx   . Colon cancer Neg Hx     No Known Allergies  Review of Systems  Constitutional: Negative.   Respiratory: Positive for cough and shortness of breath. Negative for sputum production.   Cardiovascular:  Negative for chest pain, leg swelling and PND.  Gastrointestinal: Negative.   Genitourinary: Negative.   Musculoskeletal: Negative.   All other systems reviewed and are negative.   OBJECTIVE:   Blood pressure (!) 128/91, pulse 100, temperature (!) 97.4 F (36.3 C), temperature source Oral, resp. rate 18, height 5\' 9"  (1.753 m), weight 76.2 kg, SpO2 99 %. Body mass index is 24.81 kg/m.  Physical Exam Constitutional:      Appearance: Normal appearance. Terry Page is well-developed.  HENT:     Head: Normocephalic and atraumatic.     Nose: Nose normal.  Eyes:     Extraocular Movements: Extraocular movements intact.     Conjunctiva/sclera: Conjunctivae normal.  Cardiovascular:     Rate and Rhythm: Normal rate and regular rhythm.  Pulmonary:     Effort: Pulmonary effort is normal. No respiratory distress.     Breath sounds: Wheezing present.  Abdominal:     General: There is no distension.     Palpations: Abdomen is soft.     Tenderness: There is no  abdominal tenderness.  Musculoskeletal:        General: Normal range of motion.     Cervical back: Normal range of motion and neck supple.     Right lower leg: No edema.     Left lower leg: No edema.  Neurological:     General: No focal deficit present.     Mental Status: Terry Page is alert and oriented to person, place, and time.  Psychiatric:        Mood and Affect: Mood normal.        Behavior: Behavior normal.      Lab Results: Lab Results  Component Value Date   WBC 36.5 (H) 04/12/2020   HGB 11.1 (L) 04/12/2020   HCT 32.8 (L) 04/12/2020   MCV 88.4 04/12/2020   PLT 529 (H) 04/12/2020    Lab Results  Component Value Date   NA 124 (L) 04/12/2020   K 3.4 (L) 04/12/2020   CO2 24 04/12/2020   GLUCOSE 173 (H) 04/12/2020   BUN 27 (H) 04/12/2020   CREATININE 0.76 04/12/2020   CALCIUM 8.4 (L) 04/12/2020   GFRNONAA >60 04/12/2020   GFRAA >60 06/06/2019    Lab Results  Component Value Date   ALT 35 04/10/2020   AST 37 04/10/2020    ALKPHOS 75 04/10/2020   BILITOT 1.4 (H) 04/10/2020       Component Value Date/Time   CRP 23.6 (H) 04/12/2020 0324    No results found for: ESRSEDRATE  I have reviewed the micro and lab results in Epic.  Imaging: CT Angio Chest PE W and/or Wo Contrast  Result Date: 04/10/2020 CLINICAL DATA:  Cough and shortness of breath. Leukocytosis. History of lung cancer with radiation therapy. History of prostate cancer. History of smoking. EXAM: CT ANGIOGRAPHY CHEST WITH CONTRAST TECHNIQUE: Multidetector CT imaging of the chest was performed using the standard protocol during bolus administration of intravenous contrast. Multiplanar CT image reconstructions and MIPs were obtained to evaluate the vascular anatomy. CONTRAST:  135mL OMNIPAQUE IOHEXOL 350 MG/ML SOLN COMPARISON:  Portable chest obtained earlier today. Chest CT dated 01/07/2020. PET-CT dated 02/06/2020. FINDINGS: Cardiovascular: Normally opacified pulmonary arteries with no pulmonary arterial filling defects seen. Atheromatous calcifications, including the coronary arteries and aorta. Mediastinum/Nodes: Interval mildly enlarged precarinal node with a short axis diameter 12 mm on image number 63/4. Interval mildly enlarged subcarinal node with a short axis diameter of 9 mm on image number 74/4. Interval mildly enlarged right hilar node with a short axis diameter of 7 mm on image number 78/4. Unremarkable thyroid gland and esophagus. Lungs/Pleura: Interval extensive patchy and confluent airspace opacity in the right upper lobe. There is also an interval small amount of pleural fluid medially in the right upper and lower chest. Interval nodular density in the medial aspect of the right upper lobe measuring 1.2 x 1.1 cm on image number 80/6. There is also an interval nodular density in the medial aspect of the right lower lobe, measuring 1.5 x 1.3 cm on image number 73/6. An irregular, spiculated left upper lobe nodule is again demonstrated. This  measures 1.2 x 0.9 cm on image number 51/6. This measured 1.3 x 1.1 cm on 02/06/2020. Extensive bilateral bullous changes are again demonstrated. There is an interval 2.1 x 2.0 cm nodular area adjacent to the posterior aspect of a bullous in the lingula with no significant change in a smaller, similar area nearby, slightly more superiorly and laterally on image number 77/6. There is also an interval nodular density  in the left lower lobe measuring 2.6 x 1.5 cm on image number 102/6. Upper Abdomen: Diffuse low density of the liver relative to the spleen. Stable atrophic splenic remnant. Partially included right renal cysts. Musculoskeletal: Review of the MIP images confirms the above findings. IMPRESSION: 1. Interval extensive patchy and confluent airspace opacity in the right upper lobe, compatible with pneumonia. 2. Interval small amount of right pleural fluid. 3. Interval mild mediastinal and right hilar adenopathy, most likely reactive. 4. Interval nodular densities in both lungs, as described above. These are concerning for possible metastases. 5. Interval slight decrease in size of a small spiculated nodule in the left upper lobe compatible with a metastasis or additional primary lung carcinoma. 6. 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 7. Diffuse hepatic steatosis. 8. Calcific coronary artery and aortic atherosclerosis. 9.  Calcific coronary artery and aortic atherosclerosis. Aortic Atherosclerosis (ICD10-I70.0) and Emphysema (ICD10-J43.9). Electronically Signed   By: Claudie Revering M.D.   On: 04/10/2020 20:01     Imaging independently reviewed in Epic.  [facility] [phone] pager 04/12/2020, 5:59 PM

## 2020-04-12 NOTE — Progress Notes (Signed)
PROGRESS NOTE   Terry Page.  WJX:[id number] DOB: 30-Jul-1955 DOA: 04/10/2020 PCP: [name], MD   Brief Narrative:  65 year old male with past medical history of COPD with both emphysema, spontaneous pneumothorax (2019 and 2021), SIADH, hypertension, hyperlipidemia, prostate cancer and left lung cancer (currently receiving radiation therapy) who presents to [facility] emergency department with complaints of shortness of breath. Patient symptoms of shortness of breath cough and associated weakness continued to persist overnight and he eventually presented to [facility] emergency department for evaluation. Upon evaluation in the emergency department, she was found to be in respiratory distress but not hypoxic. COVID-19 PCR testing was found to be positive.  Patient was additionally found to have extensive patchy infiltrates in the right upper lobe concerning for pneumonia with a small right pleural effusion.  There was no evidence of pulmonary embolism.  Sodium was found to be 119.  Patient also found to have multiple SIRS criteria including substantial leukocytosis of 36.3 and tachycardia concerning for developing sepsis.  Patient was initiated on intravenous ceftriaxone and azithromycin.  The hospitalist group was then called to assess the patient for admission to the hospital.     Assessment & Plan:   Principal Problem:   Sepsis ([place]) Active Problems:   COPD with acute exacerbation ([place])   COVID-19 virus infection   Primary cancer of left upper lobe of lung ([place])   Pneumonia of right upper lobe due to infectious organism   SIADH (syndrome of inappropriate ADH production) ([place])   Essential hypertension   Mixed hyperlipidemia   Sepsis, multifactorial secondary to right upper lobe CAP Acute hypoxic respiratory failure Cannot rule out concurrent COVID-19 infection Cannot rule out concurrent COPD exacerbation -Right upper lobe infiltrate on imaging concerning  for bacterial pneumonia -continue ceftriaxone/azithromycin for 5 days - will add vancomycin per discussion with ID. Appreciate insight/recs. -Covid 19 PCR positive but unclear if acutely infected, continue Remdesivir x5 days, steroids per protocol - Blood cultures preliminary positive with staph hominis x2 -Vanc added per ID as above.  COPD with acute exacerbation ([place]) with hypoxia -Patient has known history of substantial COPD with bullous emphysema, suffering 2 spontaneous pneumothoraces in the past. -Initially noted to have profound expiratory wheeze and prolonged expiratory phase at intake, improving with steroids and supportive care -No current indication for positive pressure ventilation given drastic improvement in symptoms  SIADH (syndrome of inappropriate ADH production) ([place]) - Improving - with fluid restriction - Patient did not receive sepsis fluid bolus protocol in the setting of SIADH - Sodium trough 118 at intake, 122 this morning  Primary cancer of left upper lobe of lung ([place]) - Spiculated lung mass initially identified during hospitalization in 12/2017- initially managed with radiation therapy 04/2018 - Patient has unfortunately developed another area concerning for left upper lobe lung malignancy, confirmed via PET imaging performed on 2/4 - Patient is now once again receiving radiation therapy - Presence of lung malignancy place patient at risk of rapid clinical decompensation.  History of prostate cancer Status post radiation therapy  Essential hypertension Continue home regimen of antihypertensive therapy as blood pressure tolerates  Mixed hyperlipidemia Continue home meds, low-fat diet  DVT prophylaxis: Lovenox Code Status:  Full code Family Communication: deferred   Status is: Inpatient  Dispo: The patient is from: Home              Anticipated d/c is to: Home              Anticipated  d/c date is: >48/72h              Patient currently not medically  stable for discharge given acute respiratory failure, hypoxia need for ongoing IV Remdesivir, supplemental oxygen as well as IV antibiotics.  Consultants:   None  Procedures:   None  Antimicrobials:  Azithromycin/ceftriaxone x5 days Remdesivir x5 days Vancomycin - started 4/11  Subjective: No acute issues or events this morning, respiratory status improving, patient feels markedly improved but not yet back to baseline. Denies nausea vomiting diarrhea constipation headache fevers or chills.  Objective: Vitals:   04/11/20 0547 04/11/20 1153 04/11/20 2119 04/12/20 0617  BP:  (!) 141/98 (!) 128/100 (!) 144/94  Pulse:  (!) 103 (!) 101 95  Resp:  20 19 17   Temp:  97.7 F (36.5 C) 98.3 F (36.8 C) 97.8 F (36.6 C)  TempSrc:  Oral Oral Oral  SpO2: 97% 100% 99% 100%  Weight:      Height:        Intake/Output Summary (Last 24 hours) at 04/12/2020 0715 Last data filed at 04/12/2020 0306 Gross per 24 hour  Intake 939.78 ml  Output --  Net 939.78 ml   Filed Weights   04/10/20 1710 04/10/20 2120  Weight: 76.2 kg 76.2 kg    Examination:  General:  Pleasantly resting in bed, No acute distress. HEENT:  Normocephalic atraumatic.  Sclerae nonicteric, noninjected.  Extraocular movements intact bilaterally. Neck:  Without mass or deformity.  Trachea is midline. Lungs: Without overt rales or rhonchi, scant end expiratory wheeze noted bilaterally Heart:  Regular rate and rhythm.  Without murmurs, rubs, or gallops. Abdomen:  Soft, nontender, nondistended.  Without guarding or rebound. Extremities: Without cyanosis, clubbing, edema, or obvious deformity. Vascular:  Dorsalis pedis and posterior tibial pulses palpable bilaterally. Skin:  Warm and dry, no erythema, no ulcerations.  Data Reviewed: I have personally reviewed following labs and imaging studies  CBC: Recent Labs  Lab 04/10/20 1725 04/11/20 0202 04/12/20 0324  WBC 36.3* 33.8* 36.5*  NEUTROABS 32.1* 31.4* 33.4*  HGB  11.7* 11.5* 11.1*  HCT 34.1* 33.0* 32.8*  MCV 88.1 87.1 88.4  PLT 502* 493* 476*   Basic Metabolic Panel: Recent Labs  Lab 04/10/20 1725 04/10/20 2153 04/11/20 0202 04/11/20 0726 04/12/20 0324  NA 119* 118* 121* 122* 124*  K 4.0 3.9 3.8 3.9 3.4*  CL 84* 85* 85* 88* 88*  CO2 22 19* 22 21* 24  GLUCOSE 142* 159* 195* 184* 173*  BUN 19 18 18 20  27*  CREATININE 0.93 0.65 0.72 0.89 0.76  CALCIUM 8.8* 8.6* 8.6* 8.8* 8.4*  MG  --   --  2.0  --  2.3   GFR: Estimated Creatinine Clearance: 90.8 mL/min (by C-G formula based on SCr of 0.76 mg/dL). Liver Function Tests: Recent Labs  Lab 04/10/20 1725  AST 37  ALT 35  ALKPHOS 75  BILITOT 1.4*  PROT 7.9  ALBUMIN 3.0*   No results for input(s): LIPASE, AMYLASE in the last 168 hours. No results for input(s): AMMONIA in the last 168 hours. Coagulation Profile: Recent Labs  Lab 04/10/20 1801  INR 1.3*   Cardiac Enzymes: No results for input(s): CKTOTAL, CKMB, CKMBINDEX, TROPONINI in the last 168 hours. BNP (last 3 results) No results for input(s): PROBNP in the last 8760 hours. HbA1C: No results for input(s): HGBA1C in the last 72 hours. CBG: No results for input(s): GLUCAP in the last 168 hours. Lipid Profile: No results for input(s): CHOL, HDL, LDLCALC,  TRIG, CHOLHDL, LDLDIRECT in the last 72 hours. Thyroid Function Tests: No results for input(s): TSH, T4TOTAL, FREET4, T3FREE, THYROIDAB in the last 72 hours. Anemia Panel: No results for input(s): VITAMINB12, FOLATE, FERRITIN, TIBC, IRON, RETICCTPCT in the last 72 hours. Sepsis Labs: Recent Labs  Lab 04/10/20 1801 04/10/20 2020 04/10/20 2153  PROCALCITON 1.34  --   --   LATICACIDVEN  --  1.6 1.6    Recent Results (from the past 240 hour(s))  Resp Panel by RT-PCR (Flu A&B, Covid) Nasopharyngeal Swab     Status: Abnormal   Collection Time: 04/10/20  5:35 PM   Specimen: Nasopharyngeal Swab; Nasopharyngeal(NP) swabs in vial transport medium  Result Value Ref Range  Status   SARS Coronavirus 2 by RT PCR POSITIVE (A) NEGATIVE Final    Comment: RESULT CALLED TO, READ BACK BY AND VERIFIED WITH: JOHN FRICKUY RN04/09/22 @1916  BY P.HENDERSON (NOTE) SARS-CoV-2 target nucleic acids are DETECTED.  The SARS-CoV-2 RNA is generally detectable in upper respiratory specimens during the acute phase of infection. Positive results are indicative of the presence of the identified virus, but do not rule out bacterial infection or co-infection with other pathogens not detected by the test. Clinical correlation with patient history and other diagnostic information is necessary to determine patient infection status. The expected result is Negative.  Fact Sheet for Patients: EntrepreneurPulse.com.au  Fact Sheet for Healthcare Providers: IncredibleEmployment.be  This test is not yet approved or cleared by the [place] FDA and  has been authorized for detection and/or diagnosis of SARS-CoV-2 by FDA under an Emergency Use Authorization (EUA).  This EUA will remain in effect (meaning this  test can be used) for the duration of  the COVID-19 declaration under Section 564(b)(1) of the Act, 21 U.S.C. section 360bbb-3(b)(1), unless the authorization is terminated or revoked sooner.     Influenza A by PCR NEGATIVE NEGATIVE Final   Influenza B by PCR NEGATIVE NEGATIVE Final    Comment: (NOTE) The Xpert Xpress SARS-CoV-2/FLU/RSV plus assay is intended as an aid in the diagnosis of influenza from Nasopharyngeal swab specimens and should not be used as a sole basis for treatment. Nasal washings and aspirates are unacceptable for Xpert Xpress SARS-CoV-2/FLU/RSV testing.  Fact Sheet for Patients: EntrepreneurPulse.com.au  Fact Sheet for Healthcare Providers: IncredibleEmployment.be  This test is not yet approved or cleared by the [place] FDA and has been authorized for detection and/or  diagnosis of SARS-CoV-2 by FDA under an Emergency Use Authorization (EUA). This EUA will remain in effect (meaning this test can be used) for the duration of the COVID-19 declaration under Section 564(b)(1) of the Act, 21 U.S.C. section 360bbb-3(b)(1), unless the authorization is terminated or revoked.  Performed at [facility], [place] [address], [place], [place] [address]   Culture, blood (single)     Status: None (Preliminary result)   Collection Time: 04/10/20  8:53 PM   Specimen: BLOOD  Result Value Ref Range Status   Specimen Description   Final    BLOOD LEFT ANTECUBITAL Performed at [facility] [address], [place], [place] [address]    Special Requests   Final    BOTTLES DRAWN AEROBIC AND ANAEROBIC Blood Culture adequate volume Performed at [facility] [address], [place], [place] [address]    Culture  Setup Time   Final    GRAM POSITIVE COCCI IN BOTH AEROBIC AND ANAEROBIC BOTTLES Organism ID to follow CRITICAL RESULT CALLED TO, READ BACK BY AND VERIFIED  WITH: PHARMD Dolly Rias 04/11/2020 @ 2249 A.HUGHES    Culture   Final    NO GROWTH < 12 HOURS Performed at [facility], [place] [address], [place], [place] [address]    Report Status PENDING  Incomplete  Blood Culture ID Panel (Reflexed)     Status: Abnormal   Collection Time: 04/10/20  8:53 PM  Result Value Ref Range Status   Enterococcus faecalis NOT DETECTED NOT DETECTED Final   Enterococcus Faecium NOT DETECTED NOT DETECTED Final   Listeria monocytogenes NOT DETECTED NOT DETECTED Final   Staphylococcus species DETECTED (A) NOT DETECTED Final    Comment: CRITICAL RESULT CALLED TO, READ BACK BY AND VERIFIED WITH: PHARMD ELLEN JACKSON 04/11/2020 @ 2249 A.HUGHES    Staphylococcus aureus (BCID) NOT DETECTED NOT DETECTED Final   Staphylococcus epidermidis NOT DETECTED NOT DETECTED Final   Staphylococcus lugdunensis NOT DETECTED NOT DETECTED  Final   Streptococcus species NOT DETECTED NOT DETECTED Final   Streptococcus agalactiae NOT DETECTED NOT DETECTED Final   Streptococcus pneumoniae NOT DETECTED NOT DETECTED Final   Streptococcus pyogenes NOT DETECTED NOT DETECTED Final   A.calcoaceticus-baumannii NOT DETECTED NOT DETECTED Final   Bacteroides fragilis NOT DETECTED NOT DETECTED Final   Enterobacterales NOT DETECTED NOT DETECTED Final   Enterobacter cloacae complex NOT DETECTED NOT DETECTED Final   Escherichia coli NOT DETECTED NOT DETECTED Final   Klebsiella aerogenes NOT DETECTED NOT DETECTED Final   Klebsiella oxytoca NOT DETECTED NOT DETECTED Final   Klebsiella pneumoniae NOT DETECTED NOT DETECTED Final   Proteus species NOT DETECTED NOT DETECTED Final   Salmonella species NOT DETECTED NOT DETECTED Final   Serratia marcescens NOT DETECTED NOT DETECTED Final   Haemophilus influenzae NOT DETECTED NOT DETECTED Final   Neisseria meningitidis NOT DETECTED NOT DETECTED Final   Pseudomonas aeruginosa NOT DETECTED NOT DETECTED Final   Stenotrophomonas maltophilia NOT DETECTED NOT DETECTED Final   Candida albicans NOT DETECTED NOT DETECTED Final   Candida auris NOT DETECTED NOT DETECTED Final   Candida glabrata NOT DETECTED NOT DETECTED Final   Candida krusei NOT DETECTED NOT DETECTED Final   Candida parapsilosis NOT DETECTED NOT DETECTED Final   Candida tropicalis NOT DETECTED NOT DETECTED Final   Cryptococcus neoformans/gattii NOT DETECTED NOT DETECTED Final    Comment: Performed at [facility], [address] [place], [place] [address]  Blood Culture (routine x 2)     Status: None (Preliminary result)   Collection Time: 04/10/20  8:55 PM   Specimen: BLOOD  Result Value Ref Range Status   Specimen Description   Final    BLOOD RIGHT ANTECUBITAL Performed at [facility] [address] [place], [place] [address]    Special Requests   Final    BOTTLES DRAWN AEROBIC AND ANAEROBIC Blood Culture  adequate volume Performed at [facility] [address], [place], [place] [address]    Culture  Setup Time   Final    GRAM POSITIVE COCCI AEROBIC BOTTLE ONLY CRITICAL RESULT CALLED TO, READ BACK BY AND VERIFIED WITH: PHARMD ELLEN JACKSON 04/11/2020 @2249  A.HUGHES    Culture   Final    NO GROWTH < 12 HOURS Performed at [facility], [address], [place], [place] [address]    Report Status PENDING  Incomplete  Blood Culture (routine x 2)     Status: None (Preliminary result)   Collection Time: 04/10/20  9:53 PM   Specimen: BLOOD  Result Value Ref  Range Status   Specimen Description   Final    BLOOD BLOOD RIGHT HAND Performed at [facility] [address] [place], [place] [address]    Special Requests   Final    BOTTLES DRAWN AEROBIC ONLY Blood Culture adequate volume Performed at [facility] [address] [place], [place] [address]    Culture   Final    NO GROWTH < 12 HOURS Performed at [facility] [address], [place], [place] [address]    Report Status PENDING  Incomplete    Radiology Studies: CT Angio Chest PE W and/or Wo Contrast  Result Date: 04/10/2020 CLINICAL DATA:  Cough and shortness of breath. Leukocytosis. History of lung cancer with radiation therapy. History of prostate cancer. History of smoking. EXAM: CT ANGIOGRAPHY CHEST WITH CONTRAST TECHNIQUE: Multidetector CT imaging of the chest was performed using the standard protocol during bolus administration of intravenous contrast. Multiplanar CT image reconstructions and MIPs were obtained to evaluate the vascular anatomy. CONTRAST:  166mL OMNIPAQUE IOHEXOL 350 MG/ML SOLN COMPARISON:  Portable chest obtained earlier today. Chest CT dated 01/07/2020. PET-CT dated 02/06/2020. FINDINGS: Cardiovascular: Normally opacified pulmonary arteries with no pulmonary arterial filling defects seen. Atheromatous calcifications, including the coronary  arteries and aorta. Mediastinum/Nodes: Interval mildly enlarged precarinal node with a short axis diameter 12 mm on image number 63/4. Interval mildly enlarged subcarinal node with a short axis diameter of 9 mm on image number 74/4. Interval mildly enlarged right hilar node with a short axis diameter of 7 mm on image number 78/4. Unremarkable thyroid gland and esophagus. Lungs/Pleura: Interval extensive patchy and confluent airspace opacity in the right upper lobe. There is also an interval small amount of pleural fluid medially in the right upper and lower chest. Interval nodular density in the medial aspect of the right upper lobe measuring 1.2 x 1.1 cm on image number 80/6. There is also an interval nodular density in the medial aspect of the right lower lobe, measuring 1.5 x 1.3 cm on image number 73/6. An irregular, spiculated left upper lobe nodule is again demonstrated. This measures 1.2 x 0.9 cm on image number 51/6. This measured 1.3 x 1.1 cm on 02/06/2020. Extensive bilateral bullous changes are again demonstrated. There is an interval 2.1 x 2.0 cm nodular area adjacent to the posterior aspect of a bullous in the lingula with no significant change in a smaller, similar area nearby, slightly more superiorly and laterally on image number 77/6. There is also an interval nodular density in the left lower lobe measuring 2.6 x 1.5 cm on image number 102/6. Upper Abdomen: Diffuse low density of the liver relative to the spleen. Stable atrophic splenic remnant. Partially included right renal cysts. Musculoskeletal: Review of the MIP images confirms the above findings. IMPRESSION: 1. Interval extensive patchy and confluent airspace opacity in the right upper lobe, compatible with pneumonia. 2. Interval small amount of right pleural fluid. 3. Interval mild mediastinal and right hilar adenopathy, most likely reactive. 4. Interval nodular densities in both lungs, as described above. These are concerning for possible  metastases. 5. Interval slight decrease in size of a small spiculated nodule in the left upper lobe compatible with a metastasis or additional primary lung carcinoma. 6. 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 7. Diffuse hepatic steatosis. 8. Calcific coronary artery and aortic atherosclerosis. 9.  Calcific coronary artery and aortic atherosclerosis. Aortic Atherosclerosis (ICD10-I70.0) and Emphysema (ICD10-J43.9). Electronically Signed   By: Percell Locus.D.  On: 04/10/2020 20:01   DG Chest Portable 1 View  Result Date: 04/10/2020 CLINICAL DATA:  Cough, shortness of breath, history of bilateral lung cancer EXAM: PORTABLE CHEST 1 VIEW COMPARISON:  Chest radiographs, 06/13/2019, CT chest, 01/06/2018 FINDINGS: The heart size and mediastinal contours are within normal limits. Heterogeneous airspace opacity of the peripheral right upper lobe. Bandlike scarring of the left midlung and spiculated nodule of the central left upper lobe. The visualized skeletal structures are unremarkable. IMPRESSION: 1. Heterogeneous airspace opacity of the peripheral right upper lobe, consistent with infection. 2. Bandlike scarring of the left midlung and spiculated nodule of the central left upper lobe, better assessed by prior CT. Electronically Signed   By: Eddie Candle M.D.   On: 04/10/2020 18:13   Scheduled Meds: . amLODipine  10 mg Oral Daily  . vitamin C  500 mg Oral Daily  . aspirin EC  81 mg Oral Daily  . atorvastatin  20 mg Oral Daily  . enoxaparin (LOVENOX) injection  40 mg Subcutaneous Q24H  . irbesartan  300 mg Oral Daily  . methylPREDNISolone (SOLU-MEDROL) injection  0.5 mg/kg Intravenous Q12H   Followed by  . [START ON 04/14/2020] predniSONE  50 mg Oral Daily  . zinc sulfate  220 mg Oral Daily   Continuous Infusions: . sodium chloride Stopped (04/11/20 0430)  . azithromycin Stopped (04/11/20 2227)  . cefTRIAXone (ROCEPHIN)  IV Stopped (04/11/20 2034)  . lactated ringers    . remdesivir 100 mg  in NS 100 mL Stopped (04/11/20 1350)    LOS: 0 days   Time spent: 1min  Terry Lazo C Anmol Fleck, DO [facility]  If 7PM-7AM, please contact night-coverage [email or web address]  04/12/2020, 7:15 AM

## 2020-04-12 NOTE — Evaluation (Addendum)
Physical Therapy Evaluation Patient Details Name: Terry Page. MRN: [id number] DOB: [birth year] Today's Date: 04/12/2020   History of Present Illness  65 yo male presents with complaints of shortness of breath. Pt tested positive for COVID 04/10/20. YJE:HUDJ with both emphysema, spontaneous pneumothorax (2019 and 2021), SIADH, HTN, hyperlipidemia, prostate cancer and left lung cancer (currently receiving radiation therapy)    Clinical Impression  Pt admitted with above diagnosis. Pt independent at baseline, currently requiring RW for limited ambulation, min G due to fatiguing easily, slightly unsteady initially and to managing IV pole. Pt reports moderate difficulty with 5x STS performing in 34 seconds with BUE assisting. Pt on RA throughout eval with SpO2 93-100%, returned 2L O2 at EOS and notified RN of SpO2 on RA. Educated pt on LAQ and marching seated exercises to perform reps while up in chair and discussed time OOB and pt verbalized agreement. Pt has rollator walker he doesn't use at home, may need RW pending progress back to baseline. Pt currently with functional limitations due to the deficits listed below (see PT Problem List). Pt will benefit from skilled PT to increase their independence and safety with mobility to allow discharge to the venue listed below.       Follow Up Recommendations No PT follow up    Equipment Recommendations  None recommended by PT (possibly RW pending progress)    Recommendations for Other Services       Precautions / Restrictions Precautions Precautions: Fall Precaution Comments: monitor O2 and HR Restrictions Weight Bearing Restrictions: No      Mobility  Bed Mobility  General bed mobility comments: in chair upon arrival- per OT note, SUPV    Transfers Overall transfer level: Needs assistance Equipment used: Rolling walker (2 wheeled) Transfers: Sit to/from Stand Sit to Stand: Min guard    General transfer comment: BUE assisting to  power up, cues for hand placement, BLE braced against chair  Ambulation/Gait Ambulation/Gait assistance: Min guard Gait Distance (Feet): 15 Feet Assistive device: Rolling walker (2 wheeled) Gait Pattern/deviations: Step-through pattern;Decreased stride length Gait velocity: decreased   General Gait Details: limited to steps in room due to covid+, initially slightly unsteady, improves with time, mild SOB noted  Stairs            Wheelchair Mobility    Modified Rankin (Stroke Patients Only)       Balance Overall balance assessment: Needs assistance         Standing balance support: During functional activity;Bilateral upper extremity supported Standing balance-Leahy Scale: Poor Standing balance comment: reliant on UE support        Pertinent Vitals/Pain Pain Assessment: 0-10 Pain Score: 7  Pain Location: chest with coughing Pain Intervention(s): Limited activity within patient's tolerance;Monitored during session    [place] expects to be discharged to:: Private residence Living Arrangements: Alone Available Help at Discharge: Available PRN/intermittently;Family Type of Home: Apartment Home Access: Level entry     Home Layout: One level Home Equipment: Grab bars - tub/shower;Walker - 4 wheels Additional Comments: Pt's brother left rollator, pt states it is "too fast"    Prior Function Level of Independence: Independent  Comments: Pt reports independence with IADLs, ADLs, and community ambulation without AD.  Does not have O2 at home. Pt reports famlily provides transportation     Hand Dominance   Dominant Hand: Right    Extremity/Trunk Assessment   Upper Extremity Assessment Upper Extremity Assessment: Defer to OT evaluation    Lower Extremity Assessment Lower  Extremity Assessment: Overall WFL for tasks assessed (AROM WNL, strength 4+/5 throughout, denies numbness/tingling)    Cervical / Trunk Assessment Cervical / Trunk  Assessment: Normal  Communication   Communication: No difficulties  Cognition Arousal/Alertness: Awake/alert Behavior During Therapy: WFL for tasks assessed/performed Overall Cognitive Status: Within Functional Limits for tasks assessed       General Comments General comments (skin integrity, edema, etc.): Removed 2L O2 and pton RA with SpO2 93-100% throughout eval- RN notified    Exercises     Assessment/Plan    PT Assessment Patient needs continued PT services  PT Problem List Decreased activity tolerance;Decreased balance;Decreased knowledge of use of DME;Cardiopulmonary status limiting activity       PT Treatment Interventions DME instruction;Gait training;Functional mobility training;Therapeutic activities;Therapeutic exercise;Balance training;Patient/family education    PT Goals (Current goals can be found in the Care Plan section)  Acute Rehab PT Goals Patient Stated Goal: To be able to breath and mobilize. To increase activity tolerance. PT Goal Formulation: With patient Time For Goal Achievement: 04/26/20 Potential to Achieve Goals: Good    Frequency Min 3X/week   Barriers to discharge        Co-evaluation               AM-PAC PT "6 Clicks" Mobility  Outcome Measure Help needed turning from your back to your side while in a flat bed without using bedrails?: None Help needed moving from lying on your back to sitting on the side of a flat bed without using bedrails?: A Little Help needed moving to and from a bed to a chair (including a wheelchair)?: A Little Help needed standing up from a chair using your arms (e.g., wheelchair or bedside chair)?: A Little Help needed to walk in hospital room?: A Little Help needed climbing 3-5 steps with a railing? : A Little 6 Click Score: 19    End of Session   Activity Tolerance: Patient tolerated treatment well Patient left: in chair;with call bell/phone within reach Nurse Communication: Mobility status PT Visit  Diagnosis: Other abnormalities of gait and mobility (R26.89);Unsteadiness on feet (R26.81)    Time: 5027-7412 PT Time Calculation (min) (ACUTE ONLY): 22 min   Charges:   PT Evaluation $PT Eval Low Complexity: 1 Low           Tori Berdia Lachman PT, DPT 04/12/20, 3:01 PM

## 2020-04-12 NOTE — Evaluation (Signed)
Occupational Therapy Evaluation Patient Details Name: Terry Page. MRN: [id number] DOB: February 01, 1955 Today's Date: 04/12/2020    History of Present Illness 65 year old male with past medical history of COPD with both emphysema, spontaneous pneumothorax (2019 and 2021), SIADH, hypertension, hyperlipidemia, prostate cancer and left lung cancer (currently receiving radiation therapy) who presents to [facility] emergency department with complaints of shortness of breath. Pt tested positive for COVID.   Clinical Impression   Patient is currently requiring assistance with ADLs including minimal to moderate assist with toileting, grossly moderate assist with LE dressing, moderate assist with sponge bathing, and min guard assist with standing for 1 grooming activity with RPE of 6/10 indicating more than moderate perceived effort, all of which is below patient's typical baseline of being Independent.  During this evaluation, patient was limited by body stiffness with initial mobilization, impaired sitting and standing balance and impaired activity tolerance with need of 3L via [place] keeping SpO2 at or over 92% but with increased HR with light activity into 120s with need of cues for rest breaks.  [place] "6-clicks" Daily Activity Inpatient Short Form score of 17/24 indicates 50.11% ADL impairment this session. Patient lives with his spouse, who is able to provide PRN supervision and assistance as she works out of the home ~2 days a week.  Patient demonstrates good rehab potential, and should benefit from continued skilled occupational therapy services while in acute care to maximize safety, independence and quality of life at home.  Continued occupational therapy services in the home is recommended.  ?   Follow Up Recommendations  Home health OT (Will continue to assess.)    Equipment Recommendations  3 in 1 bedside commode;Tub/shower bench (RW with 5" wheels.)     Recommendations for Other Services       Precautions / Restrictions Precautions Precautions: Fall Precaution Comments: Mon O2 and HR Restrictions Weight Bearing Restrictions: No      Mobility Bed Mobility Overal bed mobility: Needs Assistance Bed Mobility: Supine to Sit     Supine to sit: Supervision;HOB elevated     General bed mobility comments: Increased effort and time.    Transfers Overall transfer level: Needs assistance Equipment used: Rolling walker (2 wheeled) Transfers: Sit to/from [facility] Sit to Stand: Min assist;From elevated surface Stand pivot transfers: Min guard       General transfer comment: With RW.    Balance Overall balance assessment: Needs assistance Sitting-balance support: Feet unsupported Sitting balance-Leahy Scale: Poor Sitting balance - Comments: Improved to Fair with feet supported. Postural control: Posterior lean Standing balance support: Single extremity supported Standing balance-Leahy Scale: Fair Standing balance comment: Stood statically at sink with unilateral UE support with supervision to [place] guard.                           ADL either performed or assessed with clinical judgement   ADL Overall ADL's : Needs assistance/impaired Eating/Feeding: Independent;Bed level Eating/Feeding Details (indicate cue type and reason): Prefers to eat in bed. Grooming: Oral care;Wash/dry hands;Standing;Min guard   Upper Body Bathing: Sitting;Set up   Lower Body Bathing: Moderate assistance;Sit to/from stand;Cueing for compensatory techniques   Upper Body Dressing : Set up;Sitting   Lower Body Dressing: Maximal assistance;Cueing for compensatory techniques Lower Body Dressing Details (indicate cue type and reason): Max Assist to don socks while sitting EOB with posterior leaning. With verbal cues, pt able to scoot further to EOB to allow  feet to rest on floor and was able to demonstrate improved Figure 4  position with increasaed effort, where Min Assist would likely be needed.. Toilet Transfer: Min Film/video editor Details (indicate cue type and reason): Pt pivoted to recliner with RW and Min Guard assist. Toileting- Clothing Manipulation and Hygiene: Sit to/from stand;Minimal assistance     Tub/Shower Transfer Details (indicate cue type and reason): Would anticipate at least Minimal assist with grab bars and possible need of tub bench. Functional mobility during ADLs: Minimal assistance;Min guard;Rolling walker       Vision Baseline Vision/History: Wears glasses Wears Glasses: At all times Patient Visual Report: No change from baseline       Perception     Praxis      Pertinent Vitals/Pain Pain Assessment: 0-10 Pain Score: 7  Pain Location: Chest, only when coughing. Pain Intervention(s): Limited activity within patient's tolerance;Monitored during session     Hand Dominance Right   Extremity/Trunk Assessment Upper Extremity Assessment Upper Extremity Assessment: Overall WFL for tasks assessed   Lower Extremity Assessment Lower Extremity Assessment: Defer to PT evaluation   Cervical / Trunk Assessment Cervical / Trunk Assessment: Normal   Communication Communication Communication: No difficulties   Cognition Arousal/Alertness: Awake/alert Behavior During Therapy: WFL for tasks assessed/performed Overall Cognitive Status: Within Functional Limits for tasks assessed                                 General Comments: A&Ox4 with increased time for some orientation questions.   General Comments       Exercises     Shoulder Instructions      Home Living Family/patient expects to be discharged to:: Private residence Living Arrangements: Alone Available Help at Discharge: Available PRN/intermittently;Family Type of Home: Apartment Home Access: Level entry     Home Layout: One level     Bathroom Shower/Tub: Animal nutritionist: Standard     Home Equipment: Grab bars - tub/shower          Prior Functioning/Environment Level of Independence: Independent        Comments: Pt reports independence with IADLs, ADLs, and community ambulation.  Does not have O2 at home.  Recently stopped driving-family provides or uses a Taxi.        OT Problem List: Cardiopulmonary status limiting activity;Decreased activity tolerance;Decreased knowledge of precautions;Decreased knowledge of use of DME or AE;Impaired balance (sitting and/or standing)      OT Treatment/Interventions: Self-care/ADL training;Therapeutic activities;Therapeutic exercise;Energy conservation;Patient/family education;DME and/or AE instruction;Balance training    OT Goals(Current goals can be found in the care plan section) Acute Rehab OT Goals Patient Stated Goal: To be able to breath and mobilize. To increase activity tolerance. OT Goal Formulation: With patient Time For Goal Achievement: 04/26/20 Potential to Achieve Goals: Good ADL Goals Pt Will Perform Upper Body Bathing: with supervision;with adaptive equipment;sitting Pt Will Perform Lower Body Bathing: with adaptive equipment;with modified independence;sit to/from stand;sitting/lateral leans Pt Will Perform Lower Body Dressing: with adaptive equipment;sitting/lateral leans;sit to/from stand;with modified independence Additional ADL Goal #1: Patient will identify at least 3 energy conservation strategies to employ at home in order to maximize function and quality of life and decrease caregiver burden while preventing exacerbation of symptoms and rehospitalization. Additional ADL Goal #2: Pt will stand at sink and perform 3/3 grooming tasks Mod Independenet with an RPE no greater than 3/10 in order to demonstrate standing balance and  activity tolerance needed to return home with increased safety.   Current RPE after oral care and at sink was 6/10 on 04/12/20.  OT Frequency: Min  2X/week   Barriers to D/C: Decreased caregiver support  Wife works part time.       Co-evaluation              AM-PAC OT "6 Clicks" Daily Activity     Outcome Measure Help from another person eating meals?: None Help from another person taking care of personal grooming?: A Little Help from another person toileting, which includes using toliet, bedpan, or urinal?: A Little Help from another person bathing (including washing, rinsing, drying)?: A Lot Help from another person to put on and taking off regular upper body clothing?: A Little Help from another person to put on and taking off regular lower body clothing?: A Lot 6 Click Score: 17   End of Session Equipment Utilized During Treatment: Rolling walker;Oxygen Nurse Communication: Mobility status (No chair alarm. Pt oriented to call bell and instructed to have staff with him before mobilizing, and verbalized understanding.)  Activity Tolerance: Patient tolerated treatment well;Patient limited by fatigue Patient left: in chair;with call bell/phone within reach  OT Visit Diagnosis: Unsteadiness on feet (R26.81);Muscle weakness (generalized) (M62.81)                Time: 0131-4388 OT Time Calculation (min): 42 min Charges:  OT General Charges $OT Visit: 1 Visit OT Evaluation $OT Eval Low Complexity: 1 Low OT Treatments $Self Care/Home Management : 23-37 mins  Anderson Malta, OT Acute Rehab Services Office: [phone] 04/12/2020  Julien Girt 04/12/2020, 10:52 AM

## 2020-04-12 NOTE — Plan of Care (Signed)
  Problem: Education: Goal: Knowledge of General Education information will improve Description: Including pain rating scale, medication(s)/side effects and non-pharmacologic comfort measures Outcome: Progressing   Problem: Health Behavior/Discharge Planning: Goal: Ability to manage health-related needs will improve Outcome: Progressing   Problem: Clinical Measurements: Goal: Ability to maintain clinical measurements within normal limits will improve Outcome: Progressing Goal: Will remain free from infection Outcome: Progressing Goal: Diagnostic test results will improve Outcome: Progressing Goal: Respiratory complications will improve Outcome: Progressing Goal: Cardiovascular complication will be avoided Outcome: Progressing   Problem: Activity: Goal: Risk for activity intolerance will decrease Outcome: Progressing   Problem: Nutrition: Goal: Adequate nutrition will be maintained Outcome: Progressing   Problem: Coping: Goal: Level of anxiety will decrease Outcome: Progressing   Problem: Elimination: Goal: Will not experience complications related to urinary retention Outcome: Progressing   Problem: Pain Managment: Goal: General experience of comfort will improve Outcome: Progressing   Problem: Safety: Goal: Ability to remain free from injury will improve Outcome: Progressing   Problem: Activity: Goal: Ability to tolerate increased activity will improve Outcome: Progressing   Problem: Clinical Measurements: Goal: Ability to maintain a body temperature in the normal range will improve Outcome: Progressing   Problem: Respiratory: Goal: Ability to maintain adequate ventilation will improve Outcome: Progressing Goal: Ability to maintain a clear airway will improve Outcome: Progressing   Problem: Education: Goal: Knowledge of risk factors and measures for prevention of condition will improve Outcome: Progressing   Problem: Coping: Goal: Psychosocial and  spiritual needs will be supported Outcome: Progressing   Problem: Respiratory: Goal: Complications related to the disease process, condition or treatment will be avoided or minimized Outcome: Progressing

## 2020-04-13 ENCOUNTER — Other Ambulatory Visit (HOSPITAL_COMMUNITY): Payer: Medicare HMO

## 2020-04-13 ENCOUNTER — Ambulatory Visit: Payer: Medicare HMO | Admitting: Radiation Oncology

## 2020-04-13 DIAGNOSIS — J441 Chronic obstructive pulmonary disease with (acute) exacerbation: Secondary | ICD-10-CM | POA: Diagnosis not present

## 2020-04-13 DIAGNOSIS — I10 Essential (primary) hypertension: Secondary | ICD-10-CM | POA: Diagnosis not present

## 2020-04-13 DIAGNOSIS — A419 Sepsis, unspecified organism: Secondary | ICD-10-CM | POA: Diagnosis not present

## 2020-04-13 DIAGNOSIS — U071 COVID-19: Secondary | ICD-10-CM | POA: Diagnosis not present

## 2020-04-13 LAB — CBC WITH DIFFERENTIAL/PLATELET
Abs Immature Granulocytes: 0.7 10*3/uL — ABNORMAL HIGH (ref 0.00–0.07)
Basophils Absolute: 0.1 10*3/uL (ref 0.0–0.1)
Basophils Relative: 0 %
Eosinophils Absolute: 0 10*3/uL (ref 0.0–0.5)
Eosinophils Relative: 0 %
HCT: 34.1 % — ABNORMAL LOW (ref 39.0–52.0)
Hemoglobin: 11.5 g/dL — ABNORMAL LOW (ref 13.0–17.0)
Immature Granulocytes: 2 %
Lymphocytes Relative: 1 %
Lymphs Abs: 0.4 10*3/uL — ABNORMAL LOW (ref 0.7–4.0)
MCH: 29.5 pg (ref 26.0–34.0)
MCHC: 33.7 g/dL (ref 30.0–36.0)
MCV: 87.4 fL (ref 80.0–100.0)
Monocytes Absolute: 1.4 10*3/uL — ABNORMAL HIGH (ref 0.1–1.0)
Monocytes Relative: 4 %
Neutro Abs: 30.3 10*3/uL — ABNORMAL HIGH (ref 1.7–7.7)
Neutrophils Relative %: 93 %
Platelets: 460 10*3/uL — ABNORMAL HIGH (ref 150–400)
RBC: 3.9 MIL/uL — ABNORMAL LOW (ref 4.22–5.81)
RDW: 13.7 % (ref 11.5–15.5)
WBC: 32.9 10*3/uL — ABNORMAL HIGH (ref 4.0–10.5)
nRBC: 0 % (ref 0.0–0.2)

## 2020-04-13 LAB — BASIC METABOLIC PANEL
Anion gap: 10 (ref 5–15)
BUN: 27 mg/dL — ABNORMAL HIGH (ref 8–23)
CO2: 25 mmol/L (ref 22–32)
Calcium: 8.5 mg/dL — ABNORMAL LOW (ref 8.9–10.3)
Chloride: 90 mmol/L — ABNORMAL LOW (ref 98–111)
Creatinine, Ser: 0.67 mg/dL (ref 0.61–1.24)
GFR, Estimated: >60 mL/min (ref >60)
Glucose, Bld: 184 mg/dL — ABNORMAL HIGH (ref 70–99)
Potassium: 3.6 mmol/L (ref 3.5–5.1)
Sodium: 125 mmol/L — ABNORMAL LOW (ref 135–145)

## 2020-04-13 LAB — CULTURE, BLOOD (SINGLE): Special Requests: ADEQUATE

## 2020-04-13 LAB — MAGNESIUM: Magnesium: 2.3 mg/dL (ref 1.7–2.4)

## 2020-04-13 LAB — C-REACTIVE PROTEIN: CRP: 18.7 mg/dL — ABNORMAL HIGH (ref <1.0)

## 2020-04-13 LAB — D-DIMER, QUANTITATIVE: D-Dimer, Quant: 1.76 ug/mL-FEU — ABNORMAL HIGH (ref 0.00–0.50)

## 2020-04-13 NOTE — Progress Notes (Signed)
PROGRESS NOTE   Terry Page.  JHE:[id number] DOB: 02-15-1955 DOA: 04/10/2020 PCP: [name], MD   Brief Narrative:  65 year old male with past medical history of COPD with both emphysema, spontaneous pneumothorax (2019 and 2021), SIADH, hypertension, hyperlipidemia, prostate cancer and left lung cancer (currently receiving radiation therapy) who presents to [facility] emergency department with complaints of shortness of breath. COVID-19 PCR testing was found to be positive.  Patient was additionally found to have extensive patchy infiltrates in the right upper lobe concerning for pneumonia with a small right pleural effusion meeting sepsis criteria initiated on intravenous ceftriaxone and azithromycin.  Patient's blood culture subsequently noted for staph hominis x2, ID called for further insight and recommendations.  Patient's respiratory status is improving but continues to be somewhat fatigued and weak compared to baseline.  Ongoing discussion about need for treatment of questionable bacteremia, TTE currently pending as are repeat blood cultures.     Assessment & Plan:   Principal Problem:   Sepsis ([place]) Active Problems:   COPD with acute exacerbation ([place])   COVID-19 virus infection   Primary cancer of left upper lobe of lung ([place])   Pneumonia of right upper lobe due to infectious organism   SIADH (syndrome of inappropriate ADH production) ([place])   Essential hypertension   Mixed hyperlipidemia   Acute respiratory failure with hypoxia ([name])   Sepsis, multifactorial secondary to right upper lobe CAP Rule out bacteremia with staph hominis, POA Acute hypoxic respiratory failure, resolving Cannot rule out concurrent COVID-19 infection vs COPD exacerbation -Right upper lobe infiltrate on imaging concerning for bacterial pneumonia -continue ceftriaxone/azithromycin for 5 days - will add vancomycin per discussion with ID given staph hominis. -TTE currently pending;  repeat blood cultures pending -Covid 19 PCR positive but unclear if acutely infected, continue Remdesivir x5 days, steroids per protocol  COPD with questionable acute exacerbation ([place]) given hypoxia -Patient has known history of substantial COPD with bullous emphysema, suffering 2 spontaneous pneumothoraces in the past. -Initially noted to have profound expiratory wheeze and prolonged expiratory phase at intake, improving with steroids and supportive care -No current indication for positive pressure ventilation given drastic improvement in symptoms  SIADH (syndrome of inappropriate ADH production) ([place]) - Improving - with fluid restriction - Patient did not receive sepsis fluid bolus protocol in the setting of SIADH - Sodium trough 118 at intake, 125 right this morning  Primary cancer of left upper lobe of lung ([place]) - Spiculated lung mass initially identified during hospitalization in 12/2017- initially managed with radiation therapy 04/2018 - Patient has unfortunately developed another area concerning for left upper lobe lung malignancy, confirmed via PET imaging performed on 2/4 - Patient is now once again receiving radiation therapy - Presence of lung malignancy place patient at risk of rapid clinical decompensation.  History of prostate cancer Status post radiation therapy  Essential hypertension Continue home regimen of antihypertensive therapy as blood pressure tolerates  Mixed hyperlipidemia Continue home meds, low-fat diet  DVT prophylaxis: Lovenox Code Status:  Full code Family Communication: deferred   Status is: Inpatient  Dispo: The patient is from: Home              Anticipated d/c is to: Home              Anticipated d/c date is: >48/72h              Patient currently not medically stable for discharge given acute respiratory failure, hypoxia need for ongoing IV antibiotics.  Consultants:   None  Procedures:   None  Antimicrobials:   Azithromycin/ceftriaxone x5 days Remdesivir x5 days Vancomycin - started 4/11  Subjective: No acute issues or events this morning, respiratory status improving, patient feels markedly improved but not yet back to baseline. Denies nausea vomiting diarrhea constipation headache fevers or chills.  Objective: Vitals:   04/12/20 1254 04/12/20 1511 04/12/20 2025 04/13/20 0454  BP: (!) 128/91  124/87 (!) 137/91  Pulse: 90 100 87 97  Resp: 18  (!) 21   Temp: (!) 97.4 F (36.3 C)  97.6 F (36.4 C) 97.6 F (36.4 C)  TempSrc: Oral  Oral Oral  SpO2: 100% 99% 100% 97%  Weight:      Height:        Intake/Output Summary (Last 24 hours) at 04/13/2020 0747 Last data filed at 04/13/2020 0600 Gross per 24 hour  Intake 1319.73 ml  Output 1100 ml  Net 219.73 ml   Filed Weights   04/10/20 1710 04/10/20 2120  Weight: 76.2 kg 76.2 kg    Examination:  General:  Pleasantly resting in bed, No acute distress. HEENT:  Normocephalic atraumatic.  Sclerae nonicteric, noninjected.  Extraocular movements intact bilaterally. Neck:  Without mass or deformity.  Trachea is midline. Lungs: Without overt rales or rhonchi, scant end expiratory wheeze noted bilaterally Heart:  Regular rate and rhythm.  Without murmurs, rubs, or gallops. Abdomen:  Soft, nontender, nondistended.  Without guarding or rebound. Extremities: Without cyanosis, clubbing, edema, or obvious deformity. Vascular:  Dorsalis pedis and posterior tibial pulses palpable bilaterally. Skin:  Warm and dry, no erythema, no ulcerations.  Data Reviewed: I have personally reviewed following labs and imaging studies  CBC: Recent Labs  Lab 04/10/20 1725 04/11/20 0202 04/12/20 0324 04/13/20 0629  WBC 36.3* 33.8* 36.5* 32.9*  NEUTROABS 32.1* 31.4* 33.4* PENDING  HGB 11.7* 11.5* 11.1* 11.5*  HCT 34.1* 33.0* 32.8* 34.1*  MCV 88.1 87.1 88.4 87.4  PLT 502* 493* 529* 616*   Basic Metabolic Panel: Recent Labs  Lab 04/10/20 2153 04/11/20 0202  04/11/20 0726 04/12/20 0324 04/13/20 0629  NA 118* 121* 122* 124* 125*  K 3.9 3.8 3.9 3.4* 3.6  CL 85* 85* 88* 88* 90*  CO2 19* 22 21* 24 25  GLUCOSE 159* 195* 184* 173* 184*  BUN 18 18 20  27* 27*  CREATININE 0.65 0.72 0.89 0.76 0.67  CALCIUM 8.6* 8.6* 8.8* 8.4* 8.5*  MG  --  2.0  --  2.3 2.3   GFR: Estimated Creatinine Clearance: 90.8 mL/min (by C-G formula based on SCr of 0.67 mg/dL). Liver Function Tests: Recent Labs  Lab 04/10/20 1725  AST 37  ALT 35  ALKPHOS 75  BILITOT 1.4*  PROT 7.9  ALBUMIN 3.0*   No results for input(s): LIPASE, AMYLASE in the last 168 hours. No results for input(s): AMMONIA in the last 168 hours. Coagulation Profile: Recent Labs  Lab 04/10/20 1801  INR 1.3*   Cardiac Enzymes: No results for input(s): CKTOTAL, CKMB, CKMBINDEX, TROPONINI in the last 168 hours. BNP (last 3 results) No results for input(s): PROBNP in the last 8760 hours. HbA1C: No results for input(s): HGBA1C in the last 72 hours. CBG: No results for input(s): GLUCAP in the last 168 hours. Lipid Profile: No results for input(s): CHOL, HDL, LDLCALC, TRIG, CHOLHDL, LDLDIRECT in the last 72 hours. Thyroid Function Tests: No results for input(s): TSH, T4TOTAL, FREET4, T3FREE, THYROIDAB in the last 72 hours. Anemia Panel: No results for input(s): VITAMINB12, FOLATE, FERRITIN, TIBC, IRON,  RETICCTPCT in the last 72 hours. Sepsis Labs: Recent Labs  Lab 04/10/20 1801 04/10/20 2020 04/10/20 2153  PROCALCITON 1.34  --   --   LATICACIDVEN  --  1.6 1.6    Recent Results (from the past 240 hour(s))  Resp Panel by RT-PCR (Flu A&B, Covid) Nasopharyngeal Swab     Status: Abnormal   Collection Time: 04/10/20  5:35 PM   Specimen: Nasopharyngeal Swab; Nasopharyngeal(NP) swabs in vial transport medium  Result Value Ref Range Status   SARS Coronavirus 2 by RT PCR POSITIVE (A) NEGATIVE Final    Comment: RESULT CALLED TO, READ BACK BY AND VERIFIED WITH: JOHN FRICKUY RN04/09/22 @1916  BY  P.HENDERSON (NOTE) SARS-CoV-2 target nucleic acids are DETECTED.  The SARS-CoV-2 RNA is generally detectable in upper respiratory specimens during the acute phase of infection. Positive results are indicative of the presence of the identified virus, but do not rule out bacterial infection or co-infection with other pathogens not detected by the test. Clinical correlation with patient history and other diagnostic information is necessary to determine patient infection status. The expected result is Negative.  Fact Sheet for Patients: EntrepreneurPulse.com.au  Fact Sheet for Healthcare Providers: IncredibleEmployment.be  This test is not yet approved or cleared by the [place] FDA and  has been authorized for detection and/or diagnosis of SARS-CoV-2 by FDA under an Emergency Use Authorization (EUA).  This EUA will remain in effect (meaning this  test can be used) for the duration of  the COVID-19 declaration under Section 564(b)(1) of the Act, 21 U.S.C. section 360bbb-3(b)(1), unless the authorization is terminated or revoked sooner.     Influenza A by PCR NEGATIVE NEGATIVE Final   Influenza B by PCR NEGATIVE NEGATIVE Final    Comment: (NOTE) The Xpert Xpress SARS-CoV-2/FLU/RSV plus assay is intended as an aid in the diagnosis of influenza from Nasopharyngeal swab specimens and should not be used as a sole basis for treatment. Nasal washings and aspirates are unacceptable for Xpert Xpress SARS-CoV-2/FLU/RSV testing.  Fact Sheet for Patients: EntrepreneurPulse.com.au  Fact Sheet for Healthcare Providers: IncredibleEmployment.be  This test is not yet approved or cleared by the [place] FDA and has been authorized for detection and/or diagnosis of SARS-CoV-2 by FDA under an Emergency Use Authorization (EUA). This EUA will remain in effect (meaning this test can be used) for the duration of  the COVID-19 declaration under Section 564(b)(1) of the Act, 21 U.S.C. section 360bbb-3(b)(1), unless the authorization is terminated or revoked.  Performed at [facility], [place] [address] [place], [place] [address]   Urine culture     Status: Abnormal   Collection Time: 04/10/20  8:51 PM   Specimen: In/Out Cath Urine  Result Value Ref Range Status   Specimen Description   Final    IN/OUT CATH URINE Performed at [facility] [address] [place], [place] [address]    Special Requests   Final    NONE Performed at [facility], [place] [address], [place], [place] [address]    Culture (A)  Final    <10,000 COLONIES/mL INSIGNIFICANT GROWTH Performed at [facility] [address] [place], [place] [address]    Report Status 04/12/2020 FINAL  Final  Culture, blood (single)     Status: Abnormal (Preliminary result)   Collection Time: 04/10/20  8:53 PM   Specimen: BLOOD  Result Value Ref Range Status   Specimen Description   Final    BLOOD LEFT ANTECUBITAL Performed  at [facility], [place] [address], [place], [place] [address]    Special Requests   Final    BOTTLES DRAWN AEROBIC AND ANAEROBIC Blood Culture adequate volume Performed at [place] [address], [place], [place] [address]    Culture  Setup Time   Final    GRAM POSITIVE COCCI IN BOTH AEROBIC AND ANAEROBIC BOTTLES CRITICAL RESULT CALLED TO, READ BACK BY AND VERIFIED WITH: PHARMD ELLEN JACKSON 04/11/2020 @ 2249 A.HUGHES    Culture (A)  Final    STAPHYLOCOCCUS HOMINIS SUSCEPTIBILITIES TO FOLLOW Performed at [facility], [place] [address], [place], [place] [address]    Report Status PENDING  Incomplete  Blood Culture ID Panel (Reflexed)     Status: Abnormal   Collection Time: 04/10/20  8:53 PM  Result Value Ref Range Status   Enterococcus faecalis NOT DETECTED NOT DETECTED Final   Enterococcus Faecium  NOT DETECTED NOT DETECTED Final   Listeria monocytogenes NOT DETECTED NOT DETECTED Final   Staphylococcus species DETECTED (A) NOT DETECTED Final    Comment: CRITICAL RESULT CALLED TO, READ BACK BY AND VERIFIED WITH: PHARMD ELLEN JACKSON 04/11/2020 @ 2249 A.HUGHES    Staphylococcus aureus (BCID) NOT DETECTED NOT DETECTED Final   Staphylococcus epidermidis NOT DETECTED NOT DETECTED Final   Staphylococcus lugdunensis NOT DETECTED NOT DETECTED Final   Streptococcus species NOT DETECTED NOT DETECTED Final   Streptococcus agalactiae NOT DETECTED NOT DETECTED Final   Streptococcus pneumoniae NOT DETECTED NOT DETECTED Final   Streptococcus pyogenes NOT DETECTED NOT DETECTED Final   A.calcoaceticus-baumannii NOT DETECTED NOT DETECTED Final   Bacteroides fragilis NOT DETECTED NOT DETECTED Final   Enterobacterales NOT DETECTED NOT DETECTED Final   Enterobacter cloacae complex NOT DETECTED NOT DETECTED Final   Escherichia coli NOT DETECTED NOT DETECTED Final   Klebsiella aerogenes NOT DETECTED NOT DETECTED Final   Klebsiella oxytoca NOT DETECTED NOT DETECTED Final   Klebsiella pneumoniae NOT DETECTED NOT DETECTED Final   Proteus species NOT DETECTED NOT DETECTED Final   Salmonella species NOT DETECTED NOT DETECTED Final   Serratia marcescens NOT DETECTED NOT DETECTED Final   Haemophilus influenzae NOT DETECTED NOT DETECTED Final   Neisseria meningitidis NOT DETECTED NOT DETECTED Final   Pseudomonas aeruginosa NOT DETECTED NOT DETECTED Final   Stenotrophomonas maltophilia NOT DETECTED NOT DETECTED Final   Candida albicans NOT DETECTED NOT DETECTED Final   Candida auris NOT DETECTED NOT DETECTED Final   Candida glabrata NOT DETECTED NOT DETECTED Final   Candida krusei NOT DETECTED NOT DETECTED Final   Candida parapsilosis NOT DETECTED NOT DETECTED Final   Candida tropicalis NOT DETECTED NOT DETECTED Final   Cryptococcus neoformans/gattii NOT DETECTED NOT DETECTED Final    Comment: Performed at  [facility], [address] [place], [place] [address]  Blood Culture (routine x 2)     Status: Abnormal (Preliminary result)   Collection Time: 04/10/20  8:55 PM   Specimen: BLOOD  Result Value Ref Range Status   Specimen Description   Final    BLOOD RIGHT ANTECUBITAL Performed at [facility] [address], [place], [place] [address]    Special Requests   Final    BOTTLES DRAWN AEROBIC AND ANAEROBIC Blood Culture adequate volume Performed at [facility] [address] [place], [place] [address]    Culture  Setup Time   Final    GRAM POSITIVE COCCI AEROBIC BOTTLE ONLY CRITICAL RESULT CALLED TO, READ BACK  BY AND VERIFIED WITH: PHARMD Dolly Rias 04/11/2020 @2249  A.HUGHES Performed at [facility] [address], [place], [place] [address]    Culture STAPHYLOCOCCUS HOMINIS (A)  Final   Report Status PENDING  Incomplete  Blood Culture (routine x 2)     Status: None (Preliminary result)   Collection Time: 04/10/20  9:53 PM   Specimen: BLOOD  Result Value Ref Range Status   Specimen Description   Final    BLOOD BLOOD RIGHT HAND Performed at [facility] [address], [place], [place] [address]    Special Requests   Final    BOTTLES DRAWN AEROBIC ONLY Blood Culture adequate volume Performed at [facility] [address], [place], [place] [address]    Culture   Final    NO GROWTH 1 DAY Performed at [facility], [place] [address], [place],  [address]    Report Status PENDING  Incomplete    Radiology Studies: No results found. Scheduled Meds: . amLODipine  10 mg Oral Daily  . vitamin C  500 mg Oral Daily  . aspirin EC  81 mg Oral Daily  . atorvastatin  20 mg Oral Daily  . enoxaparin (LOVENOX) injection  40 mg Subcutaneous Q24H  . irbesartan  300 mg Oral Daily  . methylPREDNISolone (SOLU-MEDROL) injection  0.5 mg/kg Intravenous Q12H   Followed by  . [START ON  04/14/2020] predniSONE  50 mg Oral Daily  . zinc sulfate  220 mg Oral Daily   Continuous Infusions: . sodium chloride Stopped (04/11/20 0430)  . azithromycin 500 mg (04/12/20 1802)  . cefTRIAXone (ROCEPHIN)  IV 2 g (04/12/20 2021)  . lactated ringers    . remdesivir 100 mg in NS 100 mL 100 mg (04/12/20 1101)  . vancomycin 1,250 mg (04/13/20 0552)    LOS: 1 day   Time spent: 68min  Jaymere Alen C Guiseppe Flanagan, DO [facility]  If 7PM-7AM, please contact night-coverage [email or web address]  04/13/2020, 7:47 AM

## 2020-04-13 NOTE — Plan of Care (Signed)
  Problem: Activity: Goal: Risk for activity intolerance will decrease Outcome: Progressing   Problem: Nutrition: Goal: Adequate nutrition will be maintained Outcome: Progressing   Problem: Respiratory: Goal: Ability to maintain adequate ventilation will improve Outcome: Progressing Goal: Ability to maintain a clear airway will improve Outcome: Progressing

## 2020-04-14 ENCOUNTER — Inpatient Hospital Stay (HOSPITAL_COMMUNITY): Payer: Medicare HMO

## 2020-04-14 DIAGNOSIS — U071 COVID-19: Secondary | ICD-10-CM | POA: Diagnosis not present

## 2020-04-14 DIAGNOSIS — J9601 Acute respiratory failure with hypoxia: Secondary | ICD-10-CM | POA: Diagnosis not present

## 2020-04-14 DIAGNOSIS — B957 Other staphylococcus as the cause of diseases classified elsewhere: Secondary | ICD-10-CM | POA: Diagnosis not present

## 2020-04-14 DIAGNOSIS — A419 Sepsis, unspecified organism: Secondary | ICD-10-CM | POA: Diagnosis not present

## 2020-04-14 DIAGNOSIS — J441 Chronic obstructive pulmonary disease with (acute) exacerbation: Secondary | ICD-10-CM | POA: Diagnosis not present

## 2020-04-14 DIAGNOSIS — J1282 Pneumonia due to coronavirus disease 2019: Secondary | ICD-10-CM

## 2020-04-14 DIAGNOSIS — R7881 Bacteremia: Secondary | ICD-10-CM

## 2020-04-14 LAB — CBC WITH DIFFERENTIAL/PLATELET
Abs Immature Granulocytes: 0.38 10*3/uL — ABNORMAL HIGH (ref 0.00–0.07)
Basophils Absolute: 0.1 10*3/uL (ref 0.0–0.1)
Basophils Relative: 0 %
Eosinophils Absolute: 0 10*3/uL (ref 0.0–0.5)
Eosinophils Relative: 0 %
HCT: 34.8 % — ABNORMAL LOW (ref 39.0–52.0)
Hemoglobin: 11.7 g/dL — ABNORMAL LOW (ref 13.0–17.0)
Immature Granulocytes: 1 %
Lymphocytes Relative: 2 %
Lymphs Abs: 0.5 10*3/uL — ABNORMAL LOW (ref 0.7–4.0)
MCH: 29.7 pg (ref 26.0–34.0)
MCHC: 33.6 g/dL (ref 30.0–36.0)
MCV: 88.3 fL (ref 80.0–100.0)
Monocytes Absolute: 1.8 10*3/uL — ABNORMAL HIGH (ref 0.1–1.0)
Monocytes Relative: 6 %
Neutro Abs: 27.7 10*3/uL — ABNORMAL HIGH (ref 1.7–7.7)
Neutrophils Relative %: 91 %
Platelets: 558 10*3/uL — ABNORMAL HIGH (ref 150–400)
RBC: 3.94 MIL/uL — ABNORMAL LOW (ref 4.22–5.81)
RDW: 13.9 % (ref 11.5–15.5)
WBC: 30.5 10*3/uL — ABNORMAL HIGH (ref 4.0–10.5)
nRBC: 0 % (ref 0.0–0.2)

## 2020-04-14 LAB — ECHOCARDIOGRAM COMPLETE
Area-P 1/2: 2.87 cm2
Calc EF: 70.2 %
Height: 69 in
S' Lateral: 2.2 cm
Single Plane A2C EF: 68.4 %
Single Plane A4C EF: 70.9 %
Weight: 2688 oz

## 2020-04-14 LAB — BASIC METABOLIC PANEL
Anion gap: 10 (ref 5–15)
BUN: 27 mg/dL — ABNORMAL HIGH (ref 8–23)
CO2: 25 mmol/L (ref 22–32)
Calcium: 8.6 mg/dL — ABNORMAL LOW (ref 8.9–10.3)
Chloride: 92 mmol/L — ABNORMAL LOW (ref 98–111)
Creatinine, Ser: 0.75 mg/dL (ref 0.61–1.24)
GFR, Estimated: >60 mL/min (ref >60)
Glucose, Bld: 159 mg/dL — ABNORMAL HIGH (ref 70–99)
Potassium: 3.8 mmol/L (ref 3.5–5.1)
Sodium: 127 mmol/L — ABNORMAL LOW (ref 135–145)

## 2020-04-14 LAB — C-REACTIVE PROTEIN: CRP: 15.8 mg/dL — ABNORMAL HIGH (ref <1.0)

## 2020-04-14 LAB — MAGNESIUM: Magnesium: 2.3 mg/dL (ref 1.7–2.4)

## 2020-04-14 LAB — D-DIMER, QUANTITATIVE: D-Dimer, Quant: 1.88 ug/mL-FEU — ABNORMAL HIGH (ref 0.00–0.50)

## 2020-04-14 MED ORDER — IPRATROPIUM-ALBUTEROL 0.5-2.5 (3) MG/3ML IN SOLN
3.0000 mL | RESPIRATORY_TRACT | Status: DC | PRN
  Administered 2020-04-24 – 2020-04-26 (×3): 3 mL via RESPIRATORY_TRACT
  Filled 2020-04-14 (×3): qty 3

## 2020-04-14 NOTE — Progress Notes (Signed)
Physical Therapy Treatment Patient Details Name: Terry Page. MRN: [id number] DOB: 1955-11-21 Today's Date: 04/14/2020    History of Present Illness 65 yo male presents with complaints of shortness of breath. Pt tested positive for COVID 04/10/20. IRJ:JOAC with both emphysema, spontaneous pneumothorax (2019 and 2021), SIADH, HTN, hyperlipidemia, prostate cancer and left lung cancer (currently receiving radiation therapy)    PT Comments    Pt in bed on 1 lt at 96%.  Assisted OOB to amb to bathroom and around room.  Avg RA was 95%, no coughing, mild dyspnea, more c/o "fatigue" and "tired" Pt lives alone in an apartment but stated his spouse (separated) and child "can help some".    Follow Up Recommendations  No PT follow up     Equipment Recommendations  None recommended by PT    Recommendations for Other Services       Precautions / Restrictions Precautions Precautions: Fall Precaution Comments: monitor O2 and HR    Mobility  Bed Mobility Overal bed mobility: Needs Assistance Bed Mobility: Supine to Sit;Sit to Supine     Supine to sit: Supervision Sit to supine: Min guard   General bed mobility comments: increased time self able but feeling weak    Transfers Overall transfer level: Needs assistance Equipment used: Rolling walker (2 wheeled) Transfers: Sit to/from Stand Sit to Stand: Supervision Stand pivot transfers: Supervision       General transfer comment: good safety cognition and use of hands to steady self  Ambulation/Gait Ambulation/Gait assistance: Supervision Gait Distance (Feet): 25 Feet Assistive device: Rolling walker (2 wheeled) Gait Pattern/deviations: Step-through pattern;Decreased stride length Gait velocity: decreased   General Gait Details: amb to bathroom and around room a total of 25 feet with walker on RA avg sats 95% and HR 75   Stairs             Wheelchair Mobility    Modified Rankin (Stroke Patients Only)        Balance                                            Cognition Arousal/Alertness: Awake/alert Behavior During Therapy: WFL for tasks assessed/performed Overall Cognitive Status: Within Functional Limits for tasks assessed                                 General Comments: A&Ox4 pleasant      Exercises      General Comments        Pertinent Vitals/Pain Pain Assessment: No/denies pain    Home Living                      Prior Function            PT Goals (current goals can now be found in the care plan section) Progress towards PT goals: Progressing toward goals    Frequency    Min 3X/week      PT Plan Current plan remains appropriate    Co-evaluation              AM-PAC PT "6 Clicks" Mobility   Outcome Measure  Help needed turning from your back to your side while in a flat bed without using bedrails?: A Little Help needed moving from lying on your back to sitting on  the side of a flat bed without using bedrails?: A Little Help needed moving to and from a bed to a chair (including a wheelchair)?: A Little Help needed standing up from a chair using your arms (e.g., wheelchair or bedside chair)?: A Little Help needed to walk in hospital room?: A Little Help needed climbing 3-5 steps with a railing? : A Little 6 Click Score: 18    End of Session Equipment Utilized During Treatment: Gait belt Activity Tolerance: Patient tolerated treatment well Patient left: in bed;with call bell/phone within reach Nurse Communication: Mobility status PT Visit Diagnosis: Other abnormalities of gait and mobility (R26.89);Unsteadiness on feet (R26.81)     Time: 7159-5396 PT Time Calculation (min) (ACUTE ONLY): 28 min  Charges:  $Gait Training: 8-22 mins $Therapeutic Activity: 8-22 mins                     Rica Koyanagi  PTA Acute  Rehabilitation Services Pager      [phone] Office      [phone]

## 2020-04-14 NOTE — Progress Notes (Signed)
[facility]  Date of Admission:  04/10/2020           Reason for visit: Follow up on staph hominis bacteremia  Current antibiotics: Ceftriaxone/Azithromycin 4/9--present Vancomycin 4/11--present Remdesivir 4/9--present  Previous antibiotics: None   ASSESSMENT:    Staph hominis bacteremia: Unclear source as the patient lacks any indwelling central lines, implanted cardiac devices, or prosthetic joint/hardware.  However, 2 of 3 sets are positive which would indicate this is likely a true pathogen.  Repeat blood cultures from 04/13/2020 is currently pending and awaiting susceptibilities of second positive culture.  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  Sodium 127 this morning. Left upper lobe lung cancer: Status post radiation therapy April 2020 with another area concerning for left upper lobe malignancy confirmed via PET imaging and once again receiving radiation.  CTA chest this admission also nodular densities in both lungs concerning for metastases  PLAN:    Continue vancomycin per pharmacy for now while awaiting additional susceptibilities and repeat blood cultures Follow-up repeat blood cultures.  If remains negative likely can do a short course for uncomplicated coag negative staph bacteremia Ceftriaxone and azithromycin x5 days Covid precautions per hospital policy Code treatment per primary team Will follow   Principal Problem:   Sepsis ([place]) Active Problems:   COPD with acute exacerbation ([place])   COVID-19 virus infection   Primary cancer of left upper lobe of lung  ([place])   Pneumonia of right upper lobe due to infectious organism   SIADH (syndrome of inappropriate ADH production) ([place])   Essential hypertension   Mixed hyperlipidemia   Acute respiratory failure with hypoxia ([place])    MEDICATIONS:    Scheduled Meds: . amLODipine  10 mg Oral Daily  . vitamin C  500 mg Oral Daily  . aspirin EC  81 mg Oral Daily  . atorvastatin  20 mg Oral Daily  . enoxaparin (LOVENOX) injection  40 mg Subcutaneous Q24H  . irbesartan  300 mg Oral Daily  . predniSONE  50 mg Oral Daily  . zinc sulfate  220 mg Oral Daily   Continuous Infusions: . sodium chloride Stopped (04/11/20 0430)  . azithromycin Stopped (04/14/20 0700)  . cefTRIAXone (ROCEPHIN)  IV Stopped (04/14/20 0700)  . lactated ringers    . vancomycin Stopped (04/14/20 0700)   PRN Meds:.sodium chloride, acetaminophen, albuterol, guaiFENesin-dextromethorphan, hydrALAZINE, ipratropium-albuterol, ondansetron **OR** ondansetron (ZOFRAN) IV, polyethylene glycol  SUBJECTIVE:   24 hour events:  No acute events noted overnight   Patient reports feeling better today, breathing improved.  Denies fevers or chills.  He is waiting for his breathing treatment at the moment.  He has no other complaints.    OBJECTIVE:   Blood pressure 131/89, pulse 90, temperature 97.6 F (36.4 C), temperature source Oral, resp. rate 16, height 5\' 9"  (1.753 m), weight 76.2 kg, SpO2 99 %. Body mass index is 24.81 kg/m.  Physical Exam Constitutional:      General: He is not in acute distress.    Appearance: He is well-developed.     Comments: Sitting up in the chair  HENT:     Head: Normocephalic and atraumatic.  Eyes:  Extraocular Movements: Extraocular movements intact.     Conjunctiva/sclera: Conjunctivae normal.  Pulmonary:     Effort: Pulmonary effort is normal. No respiratory distress.     Comments: On nasal cannula Abdominal:     Tenderness: There is no abdominal tenderness.  Neurological:     General: No  focal deficit present.     Mental Status: He is alert and oriented to person, place, and time.  Psychiatric:        Mood and Affect: Mood normal.        Behavior: Behavior normal.      Lab Results: Lab Results  Component Value Date   WBC 30.5 (H) 04/14/2020   HGB 11.7 (L) 04/14/2020   HCT 34.8 (L) 04/14/2020   MCV 88.3 04/14/2020   PLT 558 (H) 04/14/2020    Lab Results  Component Value Date   NA 127 (L) 04/14/2020   K 3.8 04/14/2020   CO2 25 04/14/2020   GLUCOSE 159 (H) 04/14/2020   BUN 27 (H) 04/14/2020   CREATININE 0.75 04/14/2020   CALCIUM 8.6 (L) 04/14/2020   GFRNONAA >60 04/14/2020   GFRAA >60 06/06/2019    Lab Results  Component Value Date   ALT 35 04/10/2020   AST 37 04/10/2020   ALKPHOS 75 04/10/2020   BILITOT 1.4 (H) 04/10/2020       Component Value Date/Time   CRP 15.8 (H) 04/14/2020 0341    No results found for: ESRSEDRATE   I have reviewed the micro and lab results in Epic.  Imaging: ECHOCARDIOGRAM COMPLETE  Result Date: 04/14/2020    ECHOCARDIOGRAM REPORT   Patient Name:   Terry Page. Date of Exam: 04/14/2020 Medical Rec #:  [id number]            Height:       69.0 in Accession #:    [id number]           Weight:       168.0 lb Date of Birth:  Jan 01, 1956             BSA:          1.918 m Patient Age:    65 years             BP:           129/92 mmHg Patient Gender: M                    HR:           89 bpm. Exam Location:  Inpatient Procedure: 2D Echo, Cardiac Doppler and Color Doppler Indications:    Bacteremia  History:        Patient has no prior history of Echocardiogram examinations.                 Risk Factors:Current Smoker, Hypertension and Dyslipidemia.                 Covid positive. Lung cancer. ETOH. Pneumothorax.  Sonographer:    Roseanna Rainbow RDCS Referring Phys: [id number] Mignon [place]  Sonographer Comments: Technically difficult study due to poor echo windows. IMPRESSIONS  1. Left ventricular ejection fraction, by estimation, is 65 to  70%. The left ventricle has normal function. The left ventricle has no regional wall motion abnormalities. There is moderate concentric left ventricular hypertrophy. Left ventricular diastolic parameters are consistent with Grade I diastolic dysfunction (impaired relaxation).  2. Right ventricular systolic function is normal. The right ventricular size  is normal.  3. The mitral valve is normal in structure. Trivial mitral valve regurgitation. No evidence of mitral stenosis.  4. The aortic valve has an indeterminant number of cusps. Aortic valve regurgitation is trivial. No aortic stenosis is present.  5. Aortic dilatation noted. 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  6. The inferior vena cava is normal in size with greater than 50% respiratory variability, suggesting right atrial pressure of 3 mmHg. Comparison(s): No prior Echocardiogram. Conclusion(s)/Recommendation(s): No evidence of valvular vegetations on this transthoracic echocardiogram. Would recommend a transesophageal echocardiogram to exclude infective endocarditis if clinically indicated. FINDINGS  Left Ventricle: Left ventricular ejection fraction, by estimation, is 65 to 70%. The left ventricle has normal function. The left ventricle has no regional wall motion abnormalities. The left ventricular internal cavity size was normal in size. There is  moderate concentric left ventricular hypertrophy. Left ventricular diastolic parameters are consistent with Grade I diastolic dysfunction (impaired relaxation). Right Ventricle: The right ventricular size is normal. No increase in right ventricular wall thickness. Right ventricular systolic function is normal. Left Atrium: Left atrial size was normal in size. Right Atrium: Right atrial size was normal in size. Pericardium: There is no evidence of pericardial effusion. Mitral Valve: The mitral valve is normal in structure. Trivial mitral valve regurgitation. No evidence of mitral  valve stenosis. Tricuspid Valve: The tricuspid valve is normal in structure. Tricuspid valve regurgitation is trivial. Aortic Valve: The aortic valve has an indeterminant number of cusps. Aortic valve regurgitation is trivial. No aortic stenosis is present. Pulmonic Valve: The pulmonic valve was not well visualized. Pulmonic valve regurgitation is not visualized. Aorta: Aortic dilatation noted. 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 Venous: The inferior vena cava is normal in size with greater than 50% respiratory variability, suggesting right atrial pressure of 3 mmHg. IAS/Shunts: No atrial level shunt detected by color flow Doppler.  LEFT VENTRICLE PLAX 2D LVIDd:         3.90 cm     Diastology LVIDs:         2.20 cm     LV e' medial:    7.94 cm/s LV PW:         1.70 cm     LV E/e' medial:  7.6 LV IVS:        1.40 cm     LV e' lateral:   6.96 cm/s LVOT diam:     2.10 cm     LV E/e' lateral: 8.7 LV SV:         55 LV SV Index:   29 LVOT Area:     3.46 cm  LV Volumes (MOD) LV vol d, MOD A2C: 50.9 ml LV vol d, MOD A4C: 76.6 ml LV vol s, MOD A2C: 16.1 ml LV vol s, MOD A4C: 22.3 ml LV SV MOD A2C:     34.8 ml LV SV MOD A4C:     76.6 ml LV SV MOD BP:      44.8 ml RIGHT VENTRICLE             IVC RV S prime:     13.40 cm/s  IVC diam: 1.40 cm TAPSE (M-mode): 1.5 cm LEFT ATRIUM             Index       RIGHT ATRIUM           Index LA diam:        2.60 cm 1.36  cm/m  RA Area:     10.20 cm LA Vol (A2C):   29.9 ml 15.59 ml/m RA Volume:   18.40 ml  9.59 ml/m LA Vol (A4C):   21.0 ml 10.95 ml/m LA Biplane Vol: 25.5 ml 13.29 ml/m  AORTIC VALVE LVOT Vmax:   97.50 cm/s LVOT Vmean:  64.300 cm/s LVOT VTI:    0.160 m  AORTA Ao Root diam: 3.60 cm Ao Asc diam:  3.70 cm MITRAL VALVE MV Area (PHT): 2.87 cm    SHUNTS MV Decel Time: 265 msec    Systemic VTI:  0.16 m MV E velocity: 60.60 cm/s  Systemic Diam: 2.10 cm MV A velocity: 82.70 cm/s MV E/A ratio:  0.73 Gwyndolyn Kaufman MD Electronically signed by  Gwyndolyn Kaufman MD Signature Date/Time: 04/14/2020/1:40:21 PM    Final      Imaging  independently reviewed in Epic.    [facility] [phone] pager 04/14/2020, 2:41 PM

## 2020-04-14 NOTE — Progress Notes (Signed)
  Echocardiogram 2D Echocardiogram has been performed.  Terry Page 04/14/2020, 12:04 PM

## 2020-04-14 NOTE — Progress Notes (Signed)
PROGRESS NOTE    Terry Page.  HWE:[id number] DOB: 1955-03-16 DOA: 04/10/2020 PCP: [name], MD    Brief Narrative:  Terry Depaulo. is a 65 year old male with past medical history significant for COPD, spontaneous pneumothorax (2019 and 2021), SIADH, essential hypertension, hyperlipidemia, prostate cancer, left lung cancer currently on radiation therapy presented to [facility] long ED with complaints of shortness of breath.  In the ED, patient was found to be hypoxic, Covid-19 PCR found to be positive.  Imaging studies notable for extensive patchy infiltrates right upper lobe concerning for pneumonia with small right pleural effusion.  Patient was started on azithromycin and ceftriaxone.  Hospitalist service consulted for further evaluation and management of acute hypoxic respiratory failure secondary to Covid-19 pneumonia versus community-acquired pneumonia.   Assessment & Plan:   Principal Problem:   Sepsis ([place]) Active Problems:   COPD with acute exacerbation ([place])   COVID-19 virus infection   Primary cancer of left upper lobe of lung ([place])   Pneumonia of right upper lobe due to infectious organism   SIADH (syndrome of inappropriate ADH production) ([place])   Essential hypertension   Mixed hyperlipidemia   Acute respiratory failure with hypoxia ([name])   Sepsis, POA Acute hypoxic respiratory failure, POA Community-acquired pneumonia, POA Covid-19 viral pneumonia, POA Patient presenting to ED with progressive shortness of breath, found to be hypoxic on presentation.  Covid-19 PCR positive.  Imaging studies with right upper lobe infiltrate concerning for bacterial pneumonia. --Azithromycin 500 mg IV every 24 hours x5 days --Ceftriaxone --Remdesivir Day #5/5 --Solu-Medrol 78 mg IV every 24 hours followed by prednisone 50 mg p.o. daily --Continue supplemental oxygen, maintain SPO2 greater than 88%, now titrated to room air --Continue vitamin C, zinc, albuterol MDI as  needed, duo nebs as needed  COPD with questionable acute exacerbation given hypoxia Patient with known history of substantial COPD with bullous emphysema; with history of 2 spontaneous pneumothorax in the past.  On initial presentation was noted to have profound expiratory wheezing and prolonged expiratory phase. --Continue IV steroids with plan to transition to prednisone 50 mg p.o. daily on 04/14/2020 --Continue duo nebs, albuterol MDI as needed --Goal SPO2 greater than 88%, currently titrated off of oxygen to room air  Staph hominis septicemia Blood cultures 3/4 on 4/9 positive for Staph hominis.  TTE 04/14/2020 with LVEF 89-38%, grade 1 diastolic dysfunction and no evidence of valvular vegetations. --Infectious disease following, appreciate assistance --TTE: Pending --Continue vancomycin  SIADH Sodium 118 at time of presentation. --Na 118>>127 --Continue fluid restriction --BMP daily  Primary cancer of left upper lobe of lung Spiculated lung mass initial identified during hospitalization in 12/2017, initially managed with radiation therapy.  Patient has unfortunately developed another area concerning for left upper lobe malignancy, confirmed via pet imaging performed on 02/06/2020. --Continue radiation therapy per oncology  History of prostate cancer Status post radiation therapy  Essential hypertension --Amlodipine 10 mg p.o. daily --Irbesartan 300 mg p.o. daily --Hydralazine 10 mg IV every 6 hours prn SBP >180 or DBP >110 --Continue aspirin and statin  Hyperlipidemia --Atorvastatin 10 mg p.o. daily   DVT prophylaxis: Lovenox   Code Status: Full Code Family Communication: Updated patient extensively at bedside, no family present  Disposition Plan:  Level of care: Med-Surg Status is: Inpatient  Remains inpatient appropriate because:Ongoing diagnostic testing needed not appropriate for outpatient work up, Unsafe d/c plan, IV treatments appropriate due to intensity of  illness or inability to take PO and Inpatient level of care appropriate  due to severity of illness   Dispo: The patient is from: Home              Anticipated d/c is to: Home              Patient currently is not medically stable to d/c.   Difficult to place patient No   Consultants:   Infectious disease  Procedures:   TTE: LVEF 13-24%, grade 1 diastolic dysfunction, no distinct valvular vegetations  Antimicrobials:   Azithromycin 4/9>>  Ceftriaxone 4/9>>  Vancomycin 4/11>>    Subjective: Patient seen and examined bedside, resting comfortably.  Shortness of breath improving.  No specific complaints this morning.  Awaiting TTE given findings of Staphylococcus hominis bacteremia.  Objective: Vitals:   04/13/20 2209 04/14/20 0426 04/14/20 0942 04/14/20 1354  BP: (!) 128/99 (!) 129/92 (!) 121/93 131/89  Pulse: 89 81 91 90  Resp: 18 17  16   Temp: 98.3 F (36.8 C) 98.2 F (36.8 C) (!) 97.4 F (36.3 C) 97.6 F (36.4 C)  TempSrc: Oral Oral Oral Oral  SpO2: 99% 99% 100% 99%  Weight:      Height:        Intake/Output Summary (Last 24 hours) at 04/14/2020 1429 Last data filed at 04/14/2020 0554 Gross per 24 hour  Intake 1609.77 ml  Output 750 ml  Net 859.77 ml   Filed Weights   04/10/20 1710 04/10/20 2120  Weight: 76.2 kg 76.2 kg    Examination:  General exam: Appears calm and comfortable  Respiratory system: Clear to auscultation. Respiratory effort normal.  On room air Cardiovascular system: S1 & S2 heard, RRR. No JVD, murmurs, rubs, gallops or clicks. No pedal edema. Gastrointestinal system: Abdomen is nondistended, soft and nontender. No organomegaly or masses felt. Normal bowel sounds heard. Central nervous system: Alert and oriented. No focal neurological deficits. Extremities: Symmetric 5 x 5 power. Skin: No rashes, lesions or ulcers Psychiatry: Judgement and insight appear normal. Mood & affect appropriate.     Data Reviewed: I have personally  reviewed following labs and imaging studies  CBC: Recent Labs  Lab 04/10/20 1725 04/11/20 0202 04/12/20 0324 04/13/20 0629 04/14/20 0341  WBC 36.3* 33.8* 36.5* 32.9* 30.5*  NEUTROABS 32.1* 31.4* 33.4* 30.3* 27.7*  HGB 11.7* 11.5* 11.1* 11.5* 11.7*  HCT 34.1* 33.0* 32.8* 34.1* 34.8*  MCV 88.1 87.1 88.4 87.4 88.3  PLT 502* 493* 529* 460* 401*   Basic Metabolic Panel: Recent Labs  Lab 04/11/20 0202 04/11/20 0726 04/12/20 0324 04/13/20 0629 04/14/20 0341  NA 121* 122* 124* 125* 127*  K 3.8 3.9 3.4* 3.6 3.8  CL 85* 88* 88* 90* 92*  CO2 22 21* 24 25 25   GLUCOSE 195* 184* 173* 184* 159*  BUN 18 20 27* 27* 27*  CREATININE 0.72 0.89 0.76 0.67 0.75  CALCIUM 8.6* 8.8* 8.4* 8.5* 8.6*  MG 2.0  --  2.3 2.3 2.3   GFR: Estimated Creatinine Clearance: 90.8 mL/min (by C-G formula based on SCr of 0.75 mg/dL). Liver Function Tests: Recent Labs  Lab 04/10/20 1725  AST 37  ALT 35  ALKPHOS 75  BILITOT 1.4*  PROT 7.9  ALBUMIN 3.0*   No results for input(s): LIPASE, AMYLASE in the last 168 hours. No results for input(s): AMMONIA in the last 168 hours. Coagulation Profile: Recent Labs  Lab 04/10/20 1801  INR 1.3*   Cardiac Enzymes: No results for input(s): CKTOTAL, CKMB, CKMBINDEX, TROPONINI in the last 168 hours. BNP (last 3 results) No results for  input(s): PROBNP in the last 8760 hours. HbA1C: No results for input(s): HGBA1C in the last 72 hours. CBG: No results for input(s): GLUCAP in the last 168 hours. Lipid Profile: No results for input(s): CHOL, HDL, LDLCALC, TRIG, CHOLHDL, LDLDIRECT in the last 72 hours. Thyroid Function Tests: No results for input(s): TSH, T4TOTAL, FREET4, T3FREE, THYROIDAB in the last 72 hours. Anemia Panel: No results for input(s): VITAMINB12, FOLATE, FERRITIN, TIBC, IRON, RETICCTPCT in the last 72 hours. Sepsis Labs: Recent Labs  Lab 04/10/20 1801 04/10/20 2020 04/10/20 2153  PROCALCITON 1.34  --   --   LATICACIDVEN  --  1.6 1.6     Recent Results (from the past 240 hour(s))  Resp Panel by RT-PCR (Flu A&B, Covid) Nasopharyngeal Swab     Status: Abnormal   Collection Time: 04/10/20  5:35 PM   Specimen: Nasopharyngeal Swab; Nasopharyngeal(NP) swabs in vial transport medium  Result Value Ref Range Status   SARS Coronavirus 2 by RT PCR POSITIVE (A) NEGATIVE Final    Comment: RESULT CALLED TO, READ BACK BY AND VERIFIED WITH: JOHN FRICKUY RN04/09/22 @1916  BY P.HENDERSON (NOTE) SARS-CoV-2 target nucleic acids are DETECTED.  The SARS-CoV-2 RNA is generally detectable in upper respiratory specimens during the acute phase of infection. Positive results are indicative of the presence of the identified virus, but do not rule out bacterial infection or co-infection with other pathogens not detected by the test. Clinical correlation with patient history and other diagnostic information is necessary to determine patient infection status. The expected result is Negative.  Fact Sheet for Patients: EntrepreneurPulse.com.au  Fact Sheet for Healthcare Providers: IncredibleEmployment.be  This test is not yet approved or cleared by the [place] FDA and  has been authorized for detection and/or diagnosis of SARS-CoV-2 by FDA under an Emergency Use Authorization (EUA).  This EUA will remain in effect (meaning this  test can be used) for the duration of  the COVID-19 declaration under Section 564(b)(1) of the Act, 21 U.S.C. section 360bbb-3(b)(1), unless the authorization is terminated or revoked sooner.     Influenza A by PCR NEGATIVE NEGATIVE Final   Influenza B by PCR NEGATIVE NEGATIVE Final    Comment: (NOTE) The Xpert Xpress SARS-CoV-2/FLU/RSV plus assay is intended as an aid in the diagnosis of influenza from Nasopharyngeal swab specimens and should not be used as a sole basis for treatment. Nasal washings and aspirates are unacceptable for Xpert Xpress  SARS-CoV-2/FLU/RSV testing.  Fact Sheet for Patients: EntrepreneurPulse.com.au  Fact Sheet for Healthcare Providers: IncredibleEmployment.be  This test is not yet approved or cleared by the [place] FDA and has been authorized for detection and/or diagnosis of SARS-CoV-2 by FDA under an Emergency Use Authorization (EUA). This EUA will remain in effect (meaning this test can be used) for the duration of the COVID-19 declaration under Section 564(b)(1) of the Act, 21 U.S.C. section 360bbb-3(b)(1), unless the authorization is terminated or revoked.  Performed at [facility], [place] [address] [place], [place] [address]   Urine culture     Status: Abnormal   Collection Time: 04/10/20  8:51 PM   Specimen: In/Out Cath Urine  Result Value Ref Range Status   Specimen Description   Final    IN/OUT CATH URINE Performed at [facility] [address] [place], [place] [address]    Special Requests   Final    NONE Performed at [facility], [place] [address], [place], [place] [address]    Culture (A)  Final    <10,000 COLONIES/mL INSIGNIFICANT GROWTH Performed at [facility] [address] [place], [place] [address]    Report Status 04/12/2020 FINAL  Final  Culture, blood (single)     Status: Abnormal   Collection Time: 04/10/20  8:53 PM   Specimen: BLOOD  Result Value Ref Range Status   Specimen Description   Final    BLOOD LEFT ANTECUBITAL Performed at [facility] [address], [place], [place] [address]    Special Requests   Final    BOTTLES DRAWN AEROBIC AND ANAEROBIC Blood Culture adequate volume Performed at [facility] [address] [place], [place] [address]    Culture  Setup Time   Final    GRAM POSITIVE COCCI IN BOTH AEROBIC AND ANAEROBIC BOTTLES CRITICAL RESULT CALLED TO, READ BACK BY AND VERIFIED WITH: PHARMD ELLEN  JACKSON 04/11/2020 @ 2249 A.HUGHES Performed at [facility], [place] [address] [place], [place] [address]    Culture STAPHYLOCOCCUS HOMINIS (A)  Final   Report Status 04/13/2020 FINAL  Final   Organism ID, Bacteria STAPHYLOCOCCUS HOMINIS  Final      Susceptibility   Staphylococcus hominis - MIC*    CIPROFLOXACIN <=0.5 SENSITIVE Sensitive     ERYTHROMYCIN <=0.25 SENSITIVE Sensitive     GENTAMICIN <=0.5 SENSITIVE Sensitive     OXACILLIN <=0.25 SENSITIVE Sensitive     TETRACYCLINE <=1 SENSITIVE Sensitive     VANCOMYCIN <=0.5 SENSITIVE Sensitive     TRIMETH/SULFA <=10 SENSITIVE Sensitive     CLINDAMYCIN <=0.25 SENSITIVE Sensitive     RIFAMPIN <=0.5 SENSITIVE Sensitive     Inducible Clindamycin NEGATIVE Sensitive     * STAPHYLOCOCCUS HOMINIS  Blood Culture ID Panel (Reflexed)     Status: Abnormal   Collection Time: 04/10/20  8:53 PM  Result Value Ref Range Status   Enterococcus faecalis NOT DETECTED NOT DETECTED Final   Enterococcus Faecium NOT DETECTED NOT DETECTED Final   Listeria monocytogenes NOT DETECTED NOT DETECTED Final   Staphylococcus species DETECTED (A) NOT DETECTED Final    Comment: CRITICAL RESULT CALLED TO, READ BACK BY AND VERIFIED WITH: PHARMD ELLEN JACKSON 04/11/2020 @ 2249 A.HUGHES    Staphylococcus aureus (BCID) NOT DETECTED NOT DETECTED Final   Staphylococcus epidermidis NOT DETECTED NOT DETECTED Final   Staphylococcus lugdunensis NOT DETECTED NOT DETECTED Final   Streptococcus species NOT DETECTED NOT DETECTED Final   Streptococcus agalactiae NOT DETECTED NOT DETECTED Final   Streptococcus pneumoniae NOT DETECTED NOT DETECTED Final   Streptococcus pyogenes NOT DETECTED NOT DETECTED Final   A.calcoaceticus-baumannii NOT DETECTED NOT DETECTED Final   Bacteroides fragilis NOT DETECTED NOT DETECTED Final   Enterobacterales NOT DETECTED NOT DETECTED Final   Enterobacter cloacae complex NOT DETECTED NOT DETECTED Final   Escherichia coli NOT DETECTED NOT  DETECTED Final   Klebsiella aerogenes NOT DETECTED NOT DETECTED Final   Klebsiella oxytoca NOT DETECTED NOT DETECTED Final   Klebsiella pneumoniae NOT DETECTED NOT DETECTED Final   Proteus species NOT DETECTED NOT DETECTED Final   Salmonella species NOT DETECTED NOT DETECTED Final   Serratia marcescens NOT DETECTED NOT DETECTED Final   Haemophilus influenzae NOT DETECTED NOT DETECTED Final   Neisseria meningitidis NOT DETECTED NOT DETECTED Final   Pseudomonas aeruginosa NOT DETECTED NOT DETECTED Final   Stenotrophomonas maltophilia NOT DETECTED NOT DETECTED Final   Candida albicans NOT DETECTED NOT DETECTED Final   Candida auris NOT DETECTED NOT DETECTED Final   Candida glabrata NOT  DETECTED NOT DETECTED Final   Candida krusei NOT DETECTED NOT DETECTED Final   Candida parapsilosis NOT DETECTED NOT DETECTED Final   Candida tropicalis NOT DETECTED NOT DETECTED Final   Cryptococcus neoformans/gattii NOT DETECTED NOT DETECTED Final    Comment: Performed at [facility], [place] [address], [place], [place] [address]  Blood Culture (routine x 2)     Status: Abnormal (Preliminary result)   Collection Time: 04/10/20  8:55 PM   Specimen: BLOOD  Result Value Ref Range Status   Specimen Description   Final    BLOOD RIGHT ANTECUBITAL Performed at [facility] [address] [place], [place] [address]    Special Requests   Final    BOTTLES DRAWN AEROBIC AND ANAEROBIC Blood Culture adequate volume Performed at [facility] [address], [place], [place] [address]    Culture  Setup Time   Final    GRAM POSITIVE COCCI AEROBIC BOTTLE ONLY CRITICAL RESULT CALLED TO, READ BACK BY AND VERIFIED WITH: PHARMD ELLEN JACKSON 04/11/2020 @2249  A.HUGHES    Culture (A)  Final    STAPHYLOCOCCUS HOMINIS SUSCEPTIBILITIES TO FOLLOW Performed at [facility], [place] [address] [place], [place] [address]    Report Status PENDING  Incomplete  Blood Culture  (routine x 2)     Status: None (Preliminary result)   Collection Time: 04/10/20  9:53 PM   Specimen: BLOOD  Result Value Ref Range Status   Specimen Description   Final    BLOOD BLOOD RIGHT HAND Performed at [facility] [address], [place], [place] [address]    Special Requests   Final    BOTTLES DRAWN AEROBIC ONLY Blood Culture adequate volume Performed at [facility] [address], [place], [place] [address]    Culture   Final    NO GROWTH 3 DAYS Performed at [facility], [place] [address] [place], [place] [address]    Report Status PENDING  Incomplete  Culture, blood (Routine X 2) w Reflex to ID Panel     Status: None (Preliminary result)   Collection Time: 04/13/20  6:35 AM   Specimen: BLOOD  Result Value Ref Range Status   Specimen Description   Final    BLOOD RIGHT ANTECUBITAL Performed at [facility] [address], [place], [place] [address]    Special Requests   Final    BOTTLES DRAWN AEROBIC AND ANAEROBIC Blood Culture adequate volume Performed at [facility] [address], [place], [place] [address]    Culture   Final    NO GROWTH 1 DAY Performed at [facility], [place] [address], [place], [place] [address]    Report Status PENDING  Incomplete  Culture, blood (Routine X 2) w Reflex to ID Panel     Status: None (Preliminary result)   Collection Time: 04/13/20  6:35 AM   Specimen: BLOOD RIGHT HAND  Result Value Ref Range Status   Specimen Description   Final    BLOOD RIGHT HAND Performed at [facility] [address], [place], [place] [address]    Special Requests   Final    BOTTLES DRAWN AEROBIC ONLY Blood Culture adequate volume Performed at [facility] [address] [place], [place] [address]    Culture   Final    NO GROWTH 1 DAY Performed at [facility], [place] [address] [place] [address], [place], [place]  [address]    Report  Status PENDING  Incomplete         Radiology Studies: ECHOCARDIOGRAM COMPLETE  Result Date: 04/14/2020    ECHOCARDIOGRAM REPORT   Patient Name:   Terry Page. Date of Exam: 04/14/2020 Medical Rec #:  [id number]            Height:       69.0 in Accession #:    [id number]           Weight:       168.0 lb Date of Birth:  [birth year]             BSA:          1.918 m Patient Age:    58 years             BP:           129/92 mmHg Patient Gender: M                    HR:           89 bpm. Exam Location:  Inpatient Procedure: 2D Echo, Cardiac Doppler and Color Doppler Indications:    Bacteremia  History:        Patient has no prior history of Echocardiogram examinations.                 Risk Factors:Current Smoker, Hypertension and Dyslipidemia.                 Covid positive. Lung cancer. ETOH. Pneumothorax.  Sonographer:    Roseanna Rainbow RDCS Referring Phys: [id number] [place]ignon [place]  Sonographer Comments: Technically difficult study due to poor echo windows. IMPRESSIONS  1. Left ventricular ejection fraction, by estimation, is 65 to 70%. The left ventricle has normal function. The left ventricle has no regional wall motion abnormalities. There is moderate concentric left ventricular hypertrophy. Left ventricular diastolic parameters are consistent with Grade I diastolic dysfunction (impaired relaxation).  2. Right ventricular systolic function is normal. The right ventricular size is normal.  3. The mitral valve is normal in structure. Trivial mitral valve regurgitation. No evidence of mitral stenosis.  4. The aortic valve has an indeterminant number of cusps. Aortic valve regurgitation is trivial. No aortic stenosis is present.  5. Aortic dilatation noted. 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  6. The inferior vena cava is normal in size with greater than 50% respiratory variability, suggesting right atrial pressure of 3 mmHg. Comparison(s): No prior Echocardiogram.  Conclusion(s)/Recommendation(s): No evidence of valvular vegetations on this transthoracic echocardiogram. Would recommend a transesophageal echocardiogram to exclude infective endocarditis if clinically indicated. FINDINGS  Left Ventricle: Left ventricular ejection fraction, by estimation, is 65 to 70%. The left ventricle has normal function. The left ventricle has no regional wall motion abnormalities. The left ventricular internal cavity size was normal in size. There is  moderate concentric left ventricular hypertrophy. Left ventricular diastolic parameters are consistent with Grade I diastolic dysfunction (impaired relaxation). Right Ventricle: The right ventricular size is normal. No increase in right ventricular wall thickness. Right ventricular systolic function is normal. Left Atrium: Left atrial size was normal in size. Right Atrium: Right atrial size was normal in size. Pericardium: There is no evidence of pericardial effusion. Mitral Valve: The mitral valve is normal in structure. Trivial mitral valve regurgitation. No evidence of mitral valve stenosis. Tricuspid Valve: The tricuspid valve is normal in structure. Tricuspid valve regurgitation is  trivial. Aortic Valve: The aortic valve has an indeterminant number of cusps. Aortic valve regurgitation is trivial. No aortic stenosis is present. Pulmonic Valve: The pulmonic valve was not well visualized. Pulmonic valve regurgitation is not visualized. Aorta: Aortic dilatation noted. 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 Venous: The inferior vena cava is normal in size with greater than 50% respiratory variability, suggesting right atrial pressure of 3 mmHg. IAS/Shunts: No atrial level shunt detected by color flow Doppler.  LEFT VENTRICLE PLAX 2D LVIDd:         3.90 cm     Diastology LVIDs:         2.20 cm     LV e' medial:    7.94 cm/s LV PW:         1.70 cm     LV E/e' medial:  7.6 LV IVS:        1.40 cm     LV e' lateral:   6.96  cm/s LVOT diam:     2.10 cm     LV E/e' lateral: 8.7 LV SV:         55 LV SV Index:   29 LVOT Area:     3.46 cm  LV Volumes (MOD) LV vol d, MOD A2C: 50.9 ml LV vol d, MOD A4C: 76.6 ml LV vol s, MOD A2C: 16.1 ml LV vol s, MOD A4C: 22.3 ml LV SV MOD A2C:     34.8 ml LV SV MOD A4C:     76.6 ml LV SV MOD BP:      44.8 ml RIGHT VENTRICLE             IVC RV S prime:     13.40 cm/s  IVC diam: 1.40 cm TAPSE (M-mode): 1.5 cm LEFT ATRIUM             Index       RIGHT ATRIUM           Index LA diam:        2.60 cm 1.36 cm/m  RA Area:     10.20 cm LA Vol (A2C):   29.9 ml 15.59 ml/m RA Volume:   18.40 ml  9.59 ml/m LA Vol (A4C):   21.0 ml 10.95 ml/m LA Biplane Vol: 25.5 ml 13.29 ml/m  AORTIC VALVE LVOT Vmax:   97.50 cm/s LVOT Vmean:  64.300 cm/s LVOT VTI:    0.160 m  AORTA Ao Root diam: 3.60 cm Ao Asc diam:  3.70 cm MITRAL VALVE MV Area (PHT): 2.87 cm    SHUNTS MV Decel Time: 265 msec    Systemic VTI:  0.16 m MV E velocity: 60.60 cm/s  Systemic Diam: 2.10 cm MV A velocity: 82.70 cm/s MV E/A ratio:  0.73 Gwyndolyn Kaufman MD Electronically signed by Gwyndolyn Kaufman MD Signature Date/Time: 04/14/2020/1:40:21 PM    Final         Scheduled Meds: . amLODipine  10 mg Oral Daily  . vitamin C  500 mg Oral Daily  . aspirin EC  81 mg Oral Daily  . atorvastatin  20 mg Oral Daily  . enoxaparin (LOVENOX) injection  40 mg Subcutaneous Q24H  . irbesartan  300 mg Oral Daily  . predniSONE  50 mg Oral Daily  . zinc sulfate  220 mg Oral Daily   Continuous Infusions: . sodium chloride Stopped (04/11/20 0430)  . azithromycin Stopped (04/14/20 0700)  . cefTRIAXone (ROCEPHIN)  IV Stopped (04/14/20 0700)  . lactated  ringers    . vancomycin Stopped (04/14/20 0700)     LOS: 2 days    Time spent: 41 minutes spent on chart review, discussion with nursing staff, consultants, updating family and interview/physical exam; more than 50% of that time was spent in counseling and/or coordination of care.    Kolin Erdahl J [place]  DO [facility] Available via Epic secure chat 7am-7pm After these hours, please refer to coverage provider listed on [email or web address] 04/14/2020, 2:29 PM

## 2020-04-14 NOTE — TOC Progression Note (Signed)
Transition of Care (TOC) - Progression Note    Patient Details  Name: Terry Page. MRN: [id number] Date of Birth: 1955/03/24  Transition of Care [facility]) CM/SW Contact  Cecil Cobbs Phone Number: 04/14/2020, 6:28 PM  Clinical Narrative:     Patient lives alone, he is interested in [facility] services.  CSW explained to patient what to expect from [facility] agencies.  Patient is interested in private pay caregivers.  CSW contacted patient and his friend [name] via phone, and [name] requested that a list of private duty agencies be emailed to her.  CSW emailed her list of private duty agencies.  CSW to continue to follow patient's progress throughout discharge planning.    Expected Discharge Plan and Services  Plan to discharge back home with home health.                                               Social Determinants of Health (SDOH) Interventions    Readmission Risk Interventions Readmission Risk Prevention Plan 10/24/2017  Post Dischage Appt Complete  Medication Screening Complete  Transportation Screening Complete  PCP follow-up Complete  Some recent data might be hidden

## 2020-04-15 ENCOUNTER — Ambulatory Visit
Admission: RE | Admit: 2020-04-15 | Discharge: 2020-04-15 | Disposition: A | Discharge status: Home / Self Care | Payer: Medicare HMO | Source: Ambulatory Visit | Attending: Radiation Oncology | Admitting: Radiation Oncology

## 2020-04-15 DIAGNOSIS — C3412 Malignant neoplasm of upper lobe, left bronchus or lung: Secondary | ICD-10-CM

## 2020-04-15 DIAGNOSIS — J9601 Acute respiratory failure with hypoxia: Secondary | ICD-10-CM | POA: Diagnosis not present

## 2020-04-15 DIAGNOSIS — R7881 Bacteremia: Secondary | ICD-10-CM | POA: Diagnosis not present

## 2020-04-15 DIAGNOSIS — B957 Other staphylococcus as the cause of diseases classified elsewhere: Secondary | ICD-10-CM | POA: Diagnosis not present

## 2020-04-15 DIAGNOSIS — U071 COVID-19: Secondary | ICD-10-CM | POA: Diagnosis not present

## 2020-04-15 LAB — CBC WITH DIFFERENTIAL/PLATELET
Abs Immature Granulocytes: 0.57 10*3/uL — ABNORMAL HIGH (ref 0.00–0.07)
Basophils Absolute: 0.1 10*3/uL (ref 0.0–0.1)
Basophils Relative: 0 %
Eosinophils Absolute: 0 10*3/uL (ref 0.0–0.5)
Eosinophils Relative: 0 %
HCT: 34.4 % — ABNORMAL LOW (ref 39.0–52.0)
Hemoglobin: 11.9 g/dL — ABNORMAL LOW (ref 13.0–17.0)
Immature Granulocytes: 2 %
Lymphocytes Relative: 3 %
Lymphs Abs: 0.7 10*3/uL (ref 0.7–4.0)
MCH: 30.1 pg (ref 26.0–34.0)
MCHC: 34.6 g/dL (ref 30.0–36.0)
MCV: 86.9 fL (ref 80.0–100.0)
Monocytes Absolute: 1.9 10*3/uL — ABNORMAL HIGH (ref 0.1–1.0)
Monocytes Relative: 8 %
Neutro Abs: 20.9 10*3/uL — ABNORMAL HIGH (ref 1.7–7.7)
Neutrophils Relative %: 87 %
Platelets: 589 10*3/uL — ABNORMAL HIGH (ref 150–400)
RBC: 3.96 MIL/uL — ABNORMAL LOW (ref 4.22–5.81)
RDW: 14.1 % (ref 11.5–15.5)
WBC: 24.1 10*3/uL — ABNORMAL HIGH (ref 4.0–10.5)
nRBC: 0 % (ref 0.0–0.2)

## 2020-04-15 LAB — BASIC METABOLIC PANEL
Anion gap: 9 (ref 5–15)
BUN: 27 mg/dL — ABNORMAL HIGH (ref 8–23)
CO2: 25 mmol/L (ref 22–32)
Calcium: 8.5 mg/dL — ABNORMAL LOW (ref 8.9–10.3)
Chloride: 93 mmol/L — ABNORMAL LOW (ref 98–111)
Creatinine, Ser: 0.59 mg/dL — ABNORMAL LOW (ref 0.61–1.24)
GFR, Estimated: >60 mL/min (ref >60)
Glucose, Bld: 154 mg/dL — ABNORMAL HIGH (ref 70–99)
Potassium: 3.7 mmol/L (ref 3.5–5.1)
Sodium: 127 mmol/L — ABNORMAL LOW (ref 135–145)

## 2020-04-15 LAB — C-REACTIVE PROTEIN: CRP: 13 mg/dL — ABNORMAL HIGH (ref <1.0)

## 2020-04-15 LAB — D-DIMER, QUANTITATIVE: D-Dimer, Quant: 2.68 ug/mL-FEU — ABNORMAL HIGH (ref 0.00–0.50)

## 2020-04-15 LAB — CULTURE, BLOOD (ROUTINE X 2): Special Requests: ADEQUATE

## 2020-04-15 LAB — MAGNESIUM: Magnesium: 2.4 mg/dL (ref 1.7–2.4)

## 2020-04-15 MED ORDER — PREDNISONE 20 MG PO TABS
40.0000 mg | ORAL_TABLET | Freq: Every day | ORAL | Status: DC
Start: 2020-04-16 — End: 2020-04-21
  Administered 2020-04-16 – 2020-04-21 (×5): 40 mg via ORAL
  Filled 2020-04-15 (×6): qty 2

## 2020-04-15 MED ORDER — CEFAZOLIN SODIUM-DEXTROSE 2-4 GM/100ML-% IV SOLN
2.0000 g | Freq: Three times a day (TID) | INTRAVENOUS | Status: AC
  Administered 2020-04-15 – 2020-04-16 (×5): 2 g via INTRAVENOUS
  Filled 2020-04-15 (×5): qty 100

## 2020-04-15 NOTE — Plan of Care (Signed)
  Problem: Activity: Goal: Risk for activity intolerance will decrease Outcome: Progressing   Problem: Elimination: Goal: Will not experience complications related to urinary retention Outcome: Progressing   Problem: Activity: Goal: Ability to tolerate increased activity will improve Outcome: Progressing

## 2020-04-15 NOTE — Progress Notes (Signed)
[name] for Infectious Disease  Date of Admission:  04/10/2020           Reason for visit: Follow up on staph hominis bacteremia  Current antibiotics: Ceftriaxone/Azithromycin 4/9--present Vancomycin 4/11--present Remdesivir 4/9--present  Previous antibiotics: None   ASSESSMENT:    Methicillin sensitive Staph hominis bacteremia: Unclear source as the patient lacks any indwelling central lines, implanted cardiac devices, or prosthetic joint/hardware.  However, 2 of 3 sets at admit are positive which would indicate this is likely a true pathogen.  Repeat blood cultures from 04/13/2020 is currently NGTD at 48 hrs.  TTE without obvious valvular vegetations (noted to be a technically difficult study) Right upper lobe pneumonia: s/p treatment for CAP with ceftriaxone and azithromycin COVID-19 positive: s/p remdesivir COPD with hypoxic respiratory failure secondary to #2 and #3: Improving. 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  Sodium 127 this morning. Left upper lobe lung cancer: Status post radiation therapy April 2020 with another area concerning for left upper lobe malignancy confirmed via PET imaging and once again receiving radiation.  CTA chest this admission also nodular densities in both lungs concerning for metastases  PLAN:    Transition to cefazolin 2gm q8h for 2 additional days to complete 7 days of treatment for uncomplicated CoNS bacteremia since 2 positive cx were drawn less than 24 hrs apart, repeat cx are negative, no metastatic site of infection, and no indwelling lines, cardiac devices, or prosthetic valves  Covid precautions per hospital policy Code treatment per primary team Please call with questions, will sign off for now.    Principal Problem:   Sepsis ([place]) Active Problems:   COPD with acute exacerbation ([place])   COVID-19 virus infection   Primary cancer of left  upper lobe of lung ([place])   Pneumonia of right upper lobe due to infectious organism   SIADH (syndrome of inappropriate ADH production) ([place])   Essential hypertension   Mixed hyperlipidemia   Acute respiratory failure with hypoxia ([name])    MEDICATIONS:    Scheduled Meds: . amLODipine  10 mg Oral Daily  . vitamin C  500 mg Oral Daily  . aspirin EC  81 mg Oral Daily  . atorvastatin  20 mg Oral Daily  . enoxaparin (LOVENOX) injection  40 mg Subcutaneous Q24H  . irbesartan  300 mg Oral Daily  . predniSONE  50 mg Oral Daily  . zinc sulfate  220 mg Oral Daily   Continuous Infusions: . sodium chloride Stopped (04/11/20 0430)  .  ceFAZolin (ANCEF) IV    . lactated ringers     PRN Meds:.sodium chloride, acetaminophen, albuterol, guaiFENesin-dextromethorphan, hydrALAZINE, ipratropium-albuterol, ondansetron **OR** ondansetron (ZOFRAN) IV, polyethylene glycol  SUBJECTIVE:   24 hour events:  No acute events noted overnight WBC improved down to 24 Afebrile Repeat BCx NGTD Remains on [place] at 1 L/min  Continues to improve, no new complaints other than some lower back discomfort from lying in bed.  Sitting up eating breakfast, no n/v/d.  Afebrile    OBJECTIVE:   Blood pressure (!) 147/97, pulse (!) 106, temperature 97.7 F (36.5 C), resp. rate 18, height 5\' 9"  (1.753 m), weight 76.2 kg, SpO2 97 %. Body mass index is 24.81 kg/m.  Physical Exam Constitutional:      General: He is not in acute distress.    Appearance: He is well-developed.     Comments: Sitting up in the chair and eating breakfast.  HENT:     Head: Normocephalic and atraumatic.  Pulmonary:     Effort: Pulmonary effort is normal. No respiratory distress.     Comments: On nasal cannula Neurological:     General: No focal deficit present.     Mental Status: He is alert and oriented to person, place, and time.  Psychiatric:        Mood and Affect: Mood normal.        Behavior: Behavior normal.      Lab  Results: Lab Results  Component Value Date   WBC 24.1 (H) 04/15/2020   HGB 11.9 (L) 04/15/2020   HCT 34.4 (L) 04/15/2020   MCV 86.9 04/15/2020   PLT 589 (H) 04/15/2020    Lab Results  Component Value Date   NA 127 (L) 04/15/2020   K 3.7 04/15/2020   CO2 25 04/15/2020   GLUCOSE 154 (H) 04/15/2020   BUN 27 (H) 04/15/2020   CREATININE 0.59 (L) 04/15/2020   CALCIUM 8.5 (L) 04/15/2020   GFRNONAA >60 04/15/2020   GFRAA >60 06/06/2019    Lab Results  Component Value Date   ALT 35 04/10/2020   AST 37 04/10/2020   ALKPHOS 75 04/10/2020   BILITOT 1.4 (H) 04/10/2020       Component Value Date/Time   CRP 13.0 (H) 04/15/2020 0336    No results found for: ESRSEDRATE   I have reviewed the micro and lab results in Epic.  Imaging: ECHOCARDIOGRAM COMPLETE  Result Date: 04/14/2020    ECHOCARDIOGRAM REPORT   Patient Name:   Terry Page. Date of Exam: 04/14/2020 Medical Rec #:  [id number]            Height:       69.0 in Accession #:    [id number]           Weight:       168.0 lb Date of Birth:  Jan 14, 1955             BSA:          1.918 m Patient Age:    65 years             BP:           129/92 mmHg Patient Gender: M                    HR:           89 bpm. Exam Location:  Inpatient Procedure: 2D Echo, Cardiac Doppler and Color Doppler Indications:    Bacteremia  History:        Patient has no prior history of Echocardiogram examinations.                 Risk Factors:Current Smoker, Hypertension and Dyslipidemia.                 Covid positive. Lung cancer. ETOH. Pneumothorax.  Sonographer:    Roseanna Rainbow RDCS Referring Phys: [id number] Mignon Pine  Sonographer Comments: Technically difficult study due to poor echo windows. IMPRESSIONS  1. Left ventricular ejection fraction, by estimation, is 65 to 70%. The left ventricle has normal function. The left ventricle has no regional wall motion abnormalities. There is moderate concentric left ventricular hypertrophy. Left ventricular diastolic  parameters are consistent with Grade I diastolic dysfunction (impaired relaxation).  2. Right ventricular systolic function is normal. The right ventricular size is normal.  3. The mitral valve is normal in structure. Trivial mitral valve  regurgitation. No evidence of mitral stenosis.  4. The aortic valve has an indeterminant number of cusps. Aortic valve regurgitation is trivial. No aortic stenosis is present.  5. Aortic dilatation noted. 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  6. The inferior vena cava is normal in size with greater than 50% respiratory variability, suggesting right atrial pressure of 3 mmHg. Comparison(s): No prior Echocardiogram. Conclusion(s)/Recommendation(s): No evidence of valvular vegetations on this transthoracic echocardiogram. Would recommend a transesophageal echocardiogram to exclude infective endocarditis if clinically indicated. FINDINGS  Left Ventricle: Left ventricular ejection fraction, by estimation, is 65 to 70%. The left ventricle has normal function. The left ventricle has no regional wall motion abnormalities. The left ventricular internal cavity size was normal in size. There is  moderate concentric left ventricular hypertrophy. Left ventricular diastolic parameters are consistent with Grade I diastolic dysfunction (impaired relaxation). Right Ventricle: The right ventricular size is normal. No increase in right ventricular wall thickness. Right ventricular systolic function is normal. Left Atrium: Left atrial size was normal in size. Right Atrium: Right atrial size was normal in size. Pericardium: There is no evidence of pericardial effusion. Mitral Valve: The mitral valve is normal in structure. Trivial mitral valve regurgitation. No evidence of mitral valve stenosis. Tricuspid Valve: The tricuspid valve is normal in structure. Tricuspid valve regurgitation is trivial. Aortic Valve: The aortic valve has an indeterminant number of cusps. Aortic  valve regurgitation is trivial. No aortic stenosis is present. Pulmonic Valve: The pulmonic valve was not well visualized. Pulmonic valve regurgitation is not visualized. Aorta: Aortic dilatation noted. 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 Venous: The inferior vena cava is normal in size with greater than 50% respiratory variability, suggesting right atrial pressure of 3 mmHg. IAS/Shunts: No atrial level shunt detected by color flow Doppler.  LEFT VENTRICLE PLAX 2D LVIDd:         3.90 cm     Diastology LVIDs:         2.20 cm     LV e' medial:    7.94 cm/s LV PW:         1.70 cm     LV E/e' medial:  7.6 LV IVS:        1.40 cm     LV e' lateral:   6.96 cm/s LVOT diam:     2.10 cm     LV E/e' lateral: 8.7 LV SV:         55 LV SV Index:   29 LVOT Area:     3.46 cm  LV Volumes (MOD) LV vol d, MOD A2C: 50.9 ml LV vol d, MOD A4C: 76.6 ml LV vol s, MOD A2C: 16.1 ml LV vol s, MOD A4C: 22.3 ml LV SV MOD A2C:     34.8 ml LV SV MOD A4C:     76.6 ml LV SV MOD BP:      44.8 ml RIGHT VENTRICLE             IVC RV S prime:     13.40 cm/s  IVC diam: 1.40 cm TAPSE (M-mode): 1.5 cm LEFT ATRIUM             Index       RIGHT ATRIUM           Index LA diam:        2.60 cm 1.36 cm/m  RA Area:     10.20 cm LA Vol (A2C):  29.9 ml 15.59 ml/m RA Volume:   18.40 ml  9.59 ml/m LA Vol (A4C):   21.0 ml 10.95 ml/m LA Biplane Vol: 25.5 ml 13.29 ml/m  AORTIC VALVE LVOT Vmax:   97.50 cm/s LVOT Vmean:  64.300 cm/s LVOT VTI:    0.160 m  AORTA Ao Root diam: 3.60 cm Ao Asc diam:  3.70 cm MITRAL VALVE MV Area (PHT): 2.87 cm    SHUNTS MV Decel Time: 265 msec    Systemic VTI:  0.16 m MV E velocity: 60.60 cm/s  Systemic Diam: 2.10 cm MV A velocity: 82.70 cm/s MV E/A ratio:  0.73 Gwyndolyn Kaufman MD Electronically signed by Gwyndolyn Kaufman MD Signature Date/Time: 04/14/2020/1:40:21 PM    Final      Imaging  independently reviewed in Epic.    [facility] Infectious [facility] [phone] pager 04/15/2020, 10:06 AM

## 2020-04-15 NOTE — Care Management Important Message (Signed)
Important Message  Patient Details IM Letter given to the Patient Name: Terry Page. MRN: [id number] Date of Birth: 08/11/1955   Medicare Important Message Given:  Yes     Kerin Salen 04/15/2020, 9:14 AM

## 2020-04-15 NOTE — Progress Notes (Addendum)
PROGRESS NOTE    Terry Page.  GQB:[id number] DOB: 02/11/1955 DOA: 04/10/2020 PCP: [name], MD    Brief Narrative:  Terry Page. is a 65 year old male with past medical history significant for COPD, spontaneous pneumothorax (2019 and 2021), SIADH, essential hypertension, hyperlipidemia, prostate cancer, left lung cancer currently on radiation therapy presented to [facility] long ED with complaints of shortness of breath.  In the ED, patient was found to be hypoxic, Covid-19 PCR found to be positive.  Imaging studies notable for extensive patchy infiltrates right upper lobe concerning for pneumonia with small right pleural effusion.  Patient was started on azithromycin and ceftriaxone.  Hospitalist service consulted for further evaluation and management of acute hypoxic respiratory failure secondary to Covid-19 pneumonia versus community-acquired pneumonia.   Assessment & Plan:   Principal Problem:   Sepsis ([place]) Active Problems:   COPD with acute exacerbation ([place])   COVID-19 virus infection   Primary cancer of left upper lobe of lung ([place])   Pneumonia of right upper lobe due to infectious organism   SIADH (syndrome of inappropriate ADH production) ([place])   Essential hypertension   Mixed hyperlipidemia   Acute respiratory failure with hypoxia ([name])   Sepsis, POA Acute hypoxic respiratory failure, POA Community-acquired pneumonia, POA Covid-19 viral pneumonia, POA Patient presenting to ED with progressive shortness of breath, found to be hypoxic on presentation.  Covid-19 PCR positive.  Imaging studies with right upper lobe infiltrate concerning for bacterial pneumonia.  Completed 5-day course of remdesivir and 5-day course of azithromycin and ceftriaxone. --prednisone 50 mg p.o. daily --Continue supplemental oxygen, maintain SPO2 greater than 88%, now titrated 1 L nasal cannula --Amatory O2 screening today --Continue vitamin C, zinc, albuterol MDI as needed, duo  nebs as needed  COPD with questionable acute exacerbation given hypoxia Patient with known history of substantial COPD with bullous emphysema; with history of 2 spontaneous pneumothorax in the past.  On initial presentation was noted to have profound expiratory wheezing and prolonged expiratory phase. --prednisone 50 mg p.o. daily, decrease to 40 mg p.o. daily tomorrow --Continue duo nebs, albuterol MDI as needed --Goal SPO2 greater than 88%, currently titrated to 1 L nasal cannula  Staph hominis septicemia Blood cultures 3/4 on 4/9 positive for Staph hominis.  TTE 04/14/2020 with LVEF 82-80%, grade 1 diastolic dysfunction and no evidence of valvular vegetations. --Infectious disease following, appreciate assistance --Repeat blood cultures 4/12 no growth x2 days --Vancomycin changed to cefazolin 2 g IV every 8 hours prior additional 2 days to complete 7-day course per ID  SIADH Sodium 118 at time of presentation. --Na 118>>127 --Continue fluid restriction 1224mL/day --BMP daily  Primary cancer of left upper lobe of lung Spiculated lung mass initial identified during hospitalization in 12/2017, initially managed with radiation therapy.  Patient has unfortunately developed another area concerning for left upper lobe malignancy, confirmed via pet imaging performed on 02/06/2020. --Continue radiation therapy per oncology  History of prostate cancer Status post radiation therapy  Essential hypertension --Amlodipine 10 mg p.o. daily --Irbesartan 300 mg p.o. daily --Hydralazine 10 mg IV every 6 hours prn SBP >180 or DBP >110 --Continue aspirin and statin  Hyperlipidemia --Atorvastatin 10 mg p.o. daily   DVT prophylaxis: Lovenox   Code Status: Full Code Family Communication: Updated patient extensively at bedside, no family present  Disposition Plan:  Level of care: Med-Surg Status is: Inpatient  Remains inpatient appropriate because:Ongoing diagnostic testing needed not appropriate  for outpatient work up, Unsafe d/c plan, IV treatments  appropriate due to intensity of illness or inability to take PO and Inpatient level of care appropriate due to severity of illness   Dispo: The patient is from: Home              Anticipated d/c is to: Home              Patient currently is not medically stable to d/c.   Difficult to place patient No   Consultants:   Infectious disease  Procedures:   TTE: LVEF 29-79%, grade 1 diastolic dysfunction, no distinct valvular vegetations  Antimicrobials:   Azithromycin 4/9>>  Ceftriaxone 4/9>>  Vancomycin 4/11>>    Subjective: Patient seen and examined bedside, resting comfortably.  No family present.  ID switched vancomycin to cefazolin today.  Denies headache, no chest pain, no palpitations, no shortness of breath, no cough/congestion, no fever/chills/night sweats, no nausea/vomiting/diarrhea, no chest pain, no palpitations, no abdominal pain, no weakness, no fatigue, no paresthesias.  No acute events overnight per nursing staff.  Objective: Vitals:   04/14/20 0942 04/14/20 1354 04/14/20 2127 04/15/20 0450  BP: (!) 121/93 131/89 (!) 130/97 (!) 147/97  Pulse: 91 90 88 (!) 106  Resp:  16 16 18   Temp: (!) 97.4 F (36.3 C) 97.6 F (36.4 C) 98.2 F (36.8 C) 97.7 F (36.5 C)  TempSrc: Oral Oral    SpO2: 100% 99% 98% 97%  Weight:      Height:        Intake/Output Summary (Last 24 hours) at 04/15/2020 1125 Last data filed at 04/15/2020 8921 Gross per 24 hour  Intake 1583.54 ml  Output 925 ml  Net 658.54 ml   Filed Weights   04/10/20 1710 04/10/20 2120  Weight: 76.2 kg 76.2 kg    Examination:  General exam: Appears calm and comfortable  Respiratory system: Clear to auscultation. Respiratory effort normal.  On 1 L nasal cannula Cardiovascular system: S1 & S2 heard, RRR. No JVD, murmurs, rubs, gallops or clicks. No pedal edema. Gastrointestinal system: Abdomen is nondistended, soft and nontender. No organomegaly or  masses felt. Normal bowel sounds heard. Central nervous system: Alert and oriented. No focal neurological deficits. Extremities: Symmetric 5 x 5 power. Skin: No rashes, lesions or ulcers Psychiatry: Judgement and insight appear normal. Mood & affect appropriate.     Data Reviewed: I have personally reviewed following labs and imaging studies  CBC: Recent Labs  Lab 04/11/20 0202 04/12/20 0324 04/13/20 0629 04/14/20 0341 04/15/20 0336  WBC 33.8* 36.5* 32.9* 30.5* 24.1*  NEUTROABS 31.4* 33.4* 30.3* 27.7* 20.9*  HGB 11.5* 11.1* 11.5* 11.7* 11.9*  HCT 33.0* 32.8* 34.1* 34.8* 34.4*  MCV 87.1 88.4 87.4 88.3 86.9  PLT 493* 529* 460* 558* 194*   Basic Metabolic Panel: Recent Labs  Lab 04/11/20 0202 04/11/20 0726 04/12/20 0324 04/13/20 0629 04/14/20 0341 04/15/20 0336  NA 121* 122* 124* 125* 127* 127*  K 3.8 3.9 3.4* 3.6 3.8 3.7  CL 85* 88* 88* 90* 92* 93*  CO2 22 21* 24 25 25 25   GLUCOSE 195* 184* 173* 184* 159* 154*  BUN 18 20 27* 27* 27* 27*  CREATININE 0.72 0.89 0.76 0.67 0.75 0.59*  CALCIUM 8.6* 8.8* 8.4* 8.5* 8.6* 8.5*  MG 2.0  --  2.3 2.3 2.3 2.4   GFR: Estimated Creatinine Clearance: 90.8 mL/min (A) (by C-G formula based on SCr of 0.59 mg/dL (L)). Liver Function Tests: Recent Labs  Lab 04/10/20 1725  AST 37  ALT 35  ALKPHOS 75  BILITOT  1.4*  PROT 7.9  ALBUMIN 3.0*   No results for input(s): LIPASE, AMYLASE in the last 168 hours. No results for input(s): AMMONIA in the last 168 hours. Coagulation Profile: Recent Labs  Lab 04/10/20 1801  INR 1.3*   Cardiac Enzymes: No results for input(s): CKTOTAL, CKMB, CKMBINDEX, TROPONINI in the last 168 hours. BNP (last 3 results) No results for input(s): PROBNP in the last 8760 hours. HbA1C: No results for input(s): HGBA1C in the last 72 hours. CBG: No results for input(s): GLUCAP in the last 168 hours. Lipid Profile: No results for input(s): CHOL, HDL, LDLCALC, TRIG, CHOLHDL, LDLDIRECT in the last 72  hours. Thyroid Function Tests: No results for input(s): TSH, T4TOTAL, FREET4, T3FREE, THYROIDAB in the last 72 hours. Anemia Panel: No results for input(s): VITAMINB12, FOLATE, FERRITIN, TIBC, IRON, RETICCTPCT in the last 72 hours. Sepsis Labs: Recent Labs  Lab 04/10/20 1801 04/10/20 2020 04/10/20 2153  PROCALCITON 1.34  --   --   LATICACIDVEN  --  1.6 1.6    Recent Results (from the past 240 hour(s))  Resp Panel by RT-PCR (Flu A&B, Covid) Nasopharyngeal Swab     Status: Abnormal   Collection Time: 04/10/20  5:35 PM   Specimen: Nasopharyngeal Swab; Nasopharyngeal(NP) swabs in vial transport medium  Result Value Ref Range Status   SARS Coronavirus 2 by RT PCR POSITIVE (A) NEGATIVE Final    Comment: RESULT CALLED TO, READ BACK BY AND VERIFIED WITH: JOHN FRICKUY RN04/09/22 @1916  BY P.HENDERSON (NOTE) SARS-CoV-2 target nucleic acids are DETECTED.  The SARS-CoV-2 RNA is generally detectable in upper respiratory specimens during the acute phase of infection. Positive results are indicative of the presence of the identified virus, but do not rule out bacterial infection or co-infection with other pathogens not detected by the test. Clinical correlation with patient history and other diagnostic information is necessary to determine patient infection status. The expected result is Negative.  Fact Sheet for Patients: EntrepreneurPulse.com.au  Fact Sheet for Healthcare Providers: IncredibleEmployment.be  This test is not yet approved or cleared by the [place] FDA and  has been authorized for detection and/or diagnosis of SARS-CoV-2 by FDA under an Emergency Use Authorization (EUA).  This EUA will remain in effect (meaning this  test can be used) for the duration of  the COVID-19 declaration under Section 564(b)(1) of the Act, 21 U.S.C. section 360bbb-3(b)(1), unless the authorization is terminated or revoked sooner.     Influenza A by PCR  NEGATIVE NEGATIVE Final   Influenza B by PCR NEGATIVE NEGATIVE Final    Comment: (NOTE) The Xpert Xpress SARS-CoV-2/FLU/RSV plus assay is intended as an aid in the diagnosis of influenza from Nasopharyngeal swab specimens and should not be used as a sole basis for treatment. Nasal washings and aspirates are unacceptable for Xpert Xpress SARS-CoV-2/FLU/RSV testing.  Fact Sheet for Patients: EntrepreneurPulse.com.au  Fact Sheet for Healthcare Providers: IncredibleEmployment.be  This test is not yet approved or cleared by the [place] FDA and has been authorized for detection and/or diagnosis of SARS-CoV-2 by FDA under an Emergency Use Authorization (EUA). This EUA will remain in effect (meaning this test can be used) for the duration of the COVID-19 declaration under Section 564(b)(1) of the Act, 21 U.S.C. section 360bbb-3(b)(1), unless the authorization is terminated or revoked.  Performed at [facility], [place] [address] [place], [place] [address]   Urine culture     Status: Abnormal   Collection Time: 04/10/20  8:51 PM   Specimen: In/Out  Cath Urine  Result Value Ref Range Status   Specimen Description   Final    IN/OUT CATH URINE Performed at [facility] [address] [place], [place] [address]    Special Requests   Final    NONE Performed at [facility], [place] [address], [place], [place] [address]    Culture (A)  Final    <10,000 COLONIES/mL INSIGNIFICANT GROWTH Performed at [facility] [place] [address] [place], [place] [address]    Report Status 04/12/2020 FINAL  Final  Culture, blood (single)     Status: Abnormal   Collection Time: 04/10/20  8:53 PM   Specimen: BLOOD  Result Value Ref Range Status   Specimen Description   Final    BLOOD LEFT ANTECUBITAL Performed at [facility] [address] [place], [place] [address]    Special  Requests   Final    BOTTLES DRAWN AEROBIC AND ANAEROBIC Blood Culture adequate volume Performed at [facility] [address], [place], [place] [address]    Culture  Setup Time   Final    GRAM POSITIVE COCCI IN BOTH AEROBIC AND ANAEROBIC BOTTLES CRITICAL RESULT CALLED TO, READ BACK BY AND VERIFIED WITH: PHARMD ELLEN JACKSON 04/11/2020 @ 2249 A.HUGHES Performed at [facility], [place] [address] [place], [place] [address]    Culture STAPHYLOCOCCUS HOMINIS (A)  Final   Report Status 04/13/2020 FINAL  Final   Organism ID, Bacteria STAPHYLOCOCCUS HOMINIS  Final      Susceptibility   Staphylococcus hominis - MIC*    CIPROFLOXACIN <=0.5 SENSITIVE Sensitive     ERYTHROMYCIN <=0.25 SENSITIVE Sensitive     GENTAMICIN <=0.5 SENSITIVE Sensitive     OXACILLIN <=0.25 SENSITIVE Sensitive     TETRACYCLINE <=1 SENSITIVE Sensitive     VANCOMYCIN <=0.5 SENSITIVE Sensitive     TRIMETH/SULFA <=10 SENSITIVE Sensitive     CLINDAMYCIN <=0.25 SENSITIVE Sensitive     RIFAMPIN <=0.5 SENSITIVE Sensitive     Inducible Clindamycin NEGATIVE Sensitive     * STAPHYLOCOCCUS HOMINIS  Blood Culture ID Panel (Reflexed)     Status: Abnormal   Collection Time: 04/10/20  8:53 PM  Result Value Ref Range Status   Enterococcus faecalis NOT DETECTED NOT DETECTED Final   Enterococcus Faecium NOT DETECTED NOT DETECTED Final   Listeria monocytogenes NOT DETECTED NOT DETECTED Final   Staphylococcus species DETECTED (A) NOT DETECTED Final    Comment: CRITICAL RESULT CALLED TO, READ BACK BY AND VERIFIED WITH: PHARMD ELLEN JACKSON 04/11/2020 @ 2249 A.HUGHES    Staphylococcus aureus (BCID) NOT DETECTED NOT DETECTED Final   Staphylococcus epidermidis NOT DETECTED NOT DETECTED Final   Staphylococcus lugdunensis NOT DETECTED NOT DETECTED Final   Streptococcus species NOT DETECTED NOT DETECTED Final   Streptococcus agalactiae NOT DETECTED NOT DETECTED Final   Streptococcus pneumoniae NOT DETECTED NOT  DETECTED Final   Streptococcus pyogenes NOT DETECTED NOT DETECTED Final   A.calcoaceticus-baumannii NOT DETECTED NOT DETECTED Final   Bacteroides fragilis NOT DETECTED NOT DETECTED Final   Enterobacterales NOT DETECTED NOT DETECTED Final   Enterobacter cloacae complex NOT DETECTED NOT DETECTED Final   Escherichia coli NOT DETECTED NOT DETECTED Final   Klebsiella aerogenes NOT DETECTED NOT DETECTED Final   Klebsiella oxytoca NOT DETECTED NOT DETECTED Final   Klebsiella pneumoniae NOT DETECTED NOT DETECTED Final   Proteus species NOT DETECTED NOT DETECTED Final   Salmonella species NOT DETECTED NOT DETECTED Final   Serratia  marcescens NOT DETECTED NOT DETECTED Final   Haemophilus influenzae NOT DETECTED NOT DETECTED Final   Neisseria meningitidis NOT DETECTED NOT DETECTED Final   Pseudomonas aeruginosa NOT DETECTED NOT DETECTED Final   Stenotrophomonas maltophilia NOT DETECTED NOT DETECTED Final   Candida albicans NOT DETECTED NOT DETECTED Final   Candida auris NOT DETECTED NOT DETECTED Final   Candida glabrata NOT DETECTED NOT DETECTED Final   Candida krusei NOT DETECTED NOT DETECTED Final   Candida parapsilosis NOT DETECTED NOT DETECTED Final   Candida tropicalis NOT DETECTED NOT DETECTED Final   Cryptococcus neoformans/gattii NOT DETECTED NOT DETECTED Final    Comment: Performed at [facility], [place] [address], [place], [place] [address]  Blood Culture (routine x 2)     Status: Abnormal   Collection Time: 04/10/20  8:55 PM   Specimen: BLOOD  Result Value Ref Range Status   Specimen Description   Final    BLOOD RIGHT ANTECUBITAL Performed at [facility] [address] [place], [place] [address]    Special Requests   Final    BOTTLES DRAWN AEROBIC AND ANAEROBIC Blood Culture adequate volume Performed at [facility] [address] [place], [place] [address]    Culture  Setup Time   Final    GRAM POSITIVE COCCI AEROBIC BOTTLE  ONLY CRITICAL RESULT CALLED TO, READ BACK BY AND VERIFIED WITH: PHARMD ELLEN JACKSON 04/11/2020 @2249  A.HUGHES Performed at [facility], [place] [address], [place], [place] [address]    Culture STAPHYLOCOCCUS HOMINIS (A)  Final   Report Status 04/15/2020 FINAL  Final   Organism ID, Bacteria STAPHYLOCOCCUS HOMINIS  Final      Susceptibility   Staphylococcus hominis - MIC*    CIPROFLOXACIN <=0.5 SENSITIVE Sensitive     ERYTHROMYCIN <=0.25 SENSITIVE Sensitive     GENTAMICIN <=0.5 SENSITIVE Sensitive     OXACILLIN <=0.25 SENSITIVE Sensitive     TETRACYCLINE 2 SENSITIVE Sensitive     VANCOMYCIN 1 SENSITIVE Sensitive     TRIMETH/SULFA <=10 SENSITIVE Sensitive     CLINDAMYCIN <=0.25 SENSITIVE Sensitive     RIFAMPIN <=0.5 SENSITIVE Sensitive     Inducible Clindamycin NEGATIVE Sensitive     * STAPHYLOCOCCUS HOMINIS  Blood Culture (routine x 2)     Status: None (Preliminary result)   Collection Time: 04/10/20  9:53 PM   Specimen: BLOOD  Result Value Ref Range Status   Specimen Description   Final    BLOOD BLOOD RIGHT HAND Performed at [facility] [address], [place], [place] [address]    Special Requests   Final    BOTTLES DRAWN AEROBIC ONLY Blood Culture adequate volume Performed at [facility] [address], [place], [place] [address]    Culture   Final    NO GROWTH 4 DAYS Performed at [facility], [place] [address] [place],  [address]    Report Status PENDING  Incomplete  Culture, blood (Routine X 2) w Reflex to ID Panel     Status: None (Preliminary result)   Collection Time: 04/13/20  6:35 AM   Specimen: BLOOD  Result Value Ref Range Status   Specimen Description   Final    BLOOD RIGHT ANTECUBITAL Performed at [facility] [address] [place],  [address]    Special Requests   Final    BOTTLES DRAWN AEROBIC AND ANAEROBIC Blood Culture adequate volume Performed at [facility],  [place] [address], [place], [place] [address]    Culture   Final    NO GROWTH 2 DAYS Performed at [facility] [address], [place], [place] [address]    Report Status PENDING  Incomplete  Culture, blood (Routine X 2) w Reflex to ID Panel     Status: None (Preliminary result)   Collection Time: 04/13/20  6:35 AM   Specimen: BLOOD RIGHT HAND  Result Value Ref Range Status   Specimen Description   Final    BLOOD RIGHT HAND Performed at [facility] [address] [place], [place] [address]    Special Requests   Final    BOTTLES DRAWN AEROBIC ONLY Blood Culture adequate volume Performed at [facility] [address], [place], [place] [address]    Culture   Final    NO GROWTH 2 DAYS Performed at [facility] [address], [place], [place] [address]    Report Status PENDING  Incomplete         Radiology Studies: ECHOCARDIOGRAM COMPLETE  Result Date: 04/14/2020    ECHOCARDIOGRAM REPORT   Patient Name:   Ben Habermann. Date of Exam: 04/14/2020 Medical Rec #:  [id number]            Height:       69.0 in Accession #:    [id number]           Weight:       168.0 lb Date of Birth:  13-Dec-1955             BSA:          1.918 m Patient Age:    8 years             BP:           129/92 mmHg Patient Gender: M                    HR:           89 bpm. Exam Location:  Inpatient Procedure: 2D Echo, Cardiac Doppler and Color Doppler Indications:    Bacteremia  History:        Patient has no prior history of Echocardiogram examinations.                 Risk Factors:Current Smoker, Hypertension and Dyslipidemia.                 Covid positive. Lung cancer. ETOH. Pneumothorax.  Sonographer:    Roseanna Rainbow RDCS Referring Phys: [id number] [place]  Sonographer Comments: Technically difficult study due to poor echo windows. IMPRESSIONS  1. Left ventricular ejection fraction, by estimation, is 65 to 70%. The left ventricle has  normal function. The left ventricle has no regional wall motion abnormalities. There is moderate concentric left ventricular hypertrophy. Left ventricular diastolic parameters are consistent with Grade I diastolic dysfunction (impaired relaxation).  2. Right ventricular systolic function is normal. The right ventricular size is normal.  3. The mitral valve is normal in structure. Trivial mitral valve regurgitation. No evidence of mitral stenosis.  4. The aortic valve has an indeterminant number of cusps. Aortic valve regurgitation is trivial. No aortic stenosis is present.  5. Aortic dilatation noted. 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  6. The inferior vena cava is normal in size with greater than 50% respiratory variability, suggesting right atrial pressure of 3 mmHg. Comparison(s):  No prior Echocardiogram. Conclusion(s)/Recommendation(s): No evidence of valvular vegetations on this transthoracic echocardiogram. Would recommend a transesophageal echocardiogram to exclude infective endocarditis if clinically indicated. FINDINGS  Left Ventricle: Left ventricular ejection fraction, by estimation, is 65 to 70%. The left ventricle has normal function. The left ventricle has no regional wall motion abnormalities. The left ventricular internal cavity size was normal in size. There is  moderate concentric left ventricular hypertrophy. Left ventricular diastolic parameters are consistent with Grade I diastolic dysfunction (impaired relaxation). Right Ventricle: The right ventricular size is normal. No increase in right ventricular wall thickness. Right ventricular systolic function is normal. Left Atrium: Left atrial size was normal in size. Right Atrium: Right atrial size was normal in size. Pericardium: There is no evidence of pericardial effusion. Mitral Valve: The mitral valve is normal in structure. Trivial mitral valve regurgitation. No evidence of mitral valve stenosis. Tricuspid Valve:  The tricuspid valve is normal in structure. Tricuspid valve regurgitation is trivial. Aortic Valve: The aortic valve has an indeterminant number of cusps. Aortic valve regurgitation is trivial. No aortic stenosis is present. Pulmonic Valve: The pulmonic valve was not well visualized. Pulmonic valve regurgitation is not visualized. Aorta: Aortic dilatation noted. 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 Venous: The inferior vena cava is normal in size with greater than 50% respiratory variability, suggesting right atrial pressure of 3 mmHg. IAS/Shunts: No atrial level shunt detected by color flow Doppler.  LEFT VENTRICLE PLAX 2D LVIDd:         3.90 cm     Diastology LVIDs:         2.20 cm     LV e' medial:    7.94 cm/s LV PW:         1.70 cm     LV E/e' medial:  7.6 LV IVS:        1.40 cm     LV e' lateral:   6.96 cm/s LVOT diam:     2.10 cm     LV E/e' lateral: 8.7 LV SV:         55 LV SV Index:   29 LVOT Area:     3.46 cm  LV Volumes (MOD) LV vol d, MOD A2C: 50.9 ml LV vol d, MOD A4C: 76.6 ml LV vol s, MOD A2C: 16.1 ml LV vol s, MOD A4C: 22.3 ml LV SV MOD A2C:     34.8 ml LV SV MOD A4C:     76.6 ml LV SV MOD BP:      44.8 ml RIGHT VENTRICLE             IVC RV S prime:     13.40 cm/s  IVC diam: 1.40 cm TAPSE (M-mode): 1.5 cm LEFT ATRIUM             Index       RIGHT ATRIUM           Index LA diam:        2.60 cm 1.36 cm/m  RA Area:     10.20 cm LA Vol (A2C):   29.9 ml 15.59 ml/m RA Volume:   18.40 ml  9.59 ml/m LA Vol (A4C):   21.0 ml 10.95 ml/m LA Biplane Vol: 25.5 ml 13.29 ml/m  AORTIC VALVE LVOT Vmax:   97.50 cm/s LVOT Vmean:  64.300 cm/s LVOT VTI:    0.160 m  AORTA Ao Root diam: 3.60 cm Ao Asc diam:  3.70 cm MITRAL VALVE MV  Area (PHT): 2.87 cm    SHUNTS MV Decel Time: 265 msec    Systemic VTI:  0.16 m MV E velocity: 60.60 cm/s  Systemic Diam: 2.10 cm MV A velocity: 82.70 cm/s MV E/A ratio:  0.73 Gwyndolyn Kaufman MD Electronically signed by Gwyndolyn Kaufman MD Signature  Date/Time: 04/14/2020/1:40:21 PM    Final         Scheduled Meds: . amLODipine  10 mg Oral Daily  . vitamin C  500 mg Oral Daily  . aspirin EC  81 mg Oral Daily  . atorvastatin  20 mg Oral Daily  . enoxaparin (LOVENOX) injection  40 mg Subcutaneous Q24H  . irbesartan  300 mg Oral Daily  . [START ON 04/16/2020] predniSONE  40 mg Oral Daily  . zinc sulfate  220 mg Oral Daily   Continuous Infusions: . sodium chloride Stopped (04/11/20 0430)  .  ceFAZolin (ANCEF) IV       LOS: 3 days    Time spent: 41 minutes spent on chart review, discussion with nursing staff, consultants, updating family and interview/physical exam; more than 50% of that time was spent in counseling and/or coordination of care.    Kamela Blansett J [place] DO [facility] Available via Epic secure chat 7am-7pm After these hours, please refer to coverage provider listed on [email or web address] 04/15/2020, 11:25 AM

## 2020-04-15 NOTE — Progress Notes (Signed)
SATURATION QUALIFICATIONS: (This note is used to comply with regulatory documentation for home oxygen)  Patient Saturations on Room Air at Rest = 98%  Patient Saturations on Room Air while Ambulating =95%  Patient Saturations on 0 Liters of oxygen while Ambulating = 95%

## 2020-04-16 ENCOUNTER — Inpatient Hospital Stay (HOSPITAL_COMMUNITY): Payer: Medicare HMO

## 2020-04-16 DIAGNOSIS — U071 COVID-19: Secondary | ICD-10-CM | POA: Diagnosis not present

## 2020-04-16 DIAGNOSIS — A419 Sepsis, unspecified organism: Secondary | ICD-10-CM | POA: Diagnosis not present

## 2020-04-16 DIAGNOSIS — J441 Chronic obstructive pulmonary disease with (acute) exacerbation: Secondary | ICD-10-CM | POA: Diagnosis not present

## 2020-04-16 DIAGNOSIS — J9601 Acute respiratory failure with hypoxia: Secondary | ICD-10-CM | POA: Diagnosis not present

## 2020-04-16 LAB — BASIC METABOLIC PANEL
Anion gap: 8 (ref 5–15)
BUN: 27 mg/dL — ABNORMAL HIGH (ref 8–23)
CO2: 28 mmol/L (ref 22–32)
Calcium: 8.8 mg/dL — ABNORMAL LOW (ref 8.9–10.3)
Chloride: 93 mmol/L — ABNORMAL LOW (ref 98–111)
Creatinine, Ser: 0.63 mg/dL (ref 0.61–1.24)
GFR, Estimated: >60 mL/min (ref >60)
Glucose, Bld: 160 mg/dL — ABNORMAL HIGH (ref 70–99)
Potassium: 3.9 mmol/L (ref 3.5–5.1)
Sodium: 129 mmol/L — ABNORMAL LOW (ref 135–145)

## 2020-04-16 LAB — CBC
HCT: 37.4 % — ABNORMAL LOW (ref 39.0–52.0)
Hemoglobin: 12.6 g/dL — ABNORMAL LOW (ref 13.0–17.0)
MCH: 29.7 pg (ref 26.0–34.0)
MCHC: 33.7 g/dL (ref 30.0–36.0)
MCV: 88.2 fL (ref 80.0–100.0)
Platelets: 594 10*3/uL — ABNORMAL HIGH (ref 150–400)
RBC: 4.24 MIL/uL (ref 4.22–5.81)
RDW: 14.3 % (ref 11.5–15.5)
WBC: 23.5 10*3/uL — ABNORMAL HIGH (ref 4.0–10.5)
nRBC: 0 % (ref 0.0–0.2)

## 2020-04-16 LAB — CULTURE, BLOOD (ROUTINE X 2)
Culture: NO GROWTH
Special Requests: ADEQUATE

## 2020-04-16 LAB — MAGNESIUM: Magnesium: 2.3 mg/dL (ref 1.7–2.4)

## 2020-04-16 MED ORDER — SODIUM CHLORIDE 0.9 % IV SOLN: INTRAVENOUS | Status: AC

## 2020-04-16 MED ORDER — ALUM & MAG HYDROXIDE-SIMETH 200-200-20 MG/5ML PO SUSP
15.0000 mL | Freq: Four times a day (QID) | ORAL | Status: DC | PRN
  Administered 2020-04-16 – 2020-05-04 (×3): 15 mL via ORAL
  Filled 2020-04-16 (×3): qty 30

## 2020-04-16 MED ORDER — IOHEXOL 300 MG/ML  SOLN
100.0000 mL | Freq: Once | INTRAMUSCULAR | Status: AC | PRN
  Administered 2020-04-16: 100 mL via INTRAVENOUS

## 2020-04-16 MED ORDER — IOHEXOL 9 MG/ML PO SOLN
500.0000 mL | ORAL | Status: AC
  Administered 2020-04-16: 500 mL via ORAL

## 2020-04-16 NOTE — Progress Notes (Signed)
PROGRESS NOTE    Terry Page.  MLY:[id number] DOB: 10/25/1955 DOA: 04/10/2020 PCP: [name], MD    Brief Narrative:  Terry Page. is a 65 year old male with past medical history significant for COPD, spontaneous pneumothorax (2019 and 2021), SIADH, essential hypertension, hyperlipidemia, prostate cancer, left lung cancer currently on radiation therapy presented to [facility] long ED with complaints of shortness of breath.  In the ED, patient was found to be hypoxic, Covid-19 PCR found to be positive.  Imaging studies notable for extensive patchy infiltrates right upper lobe concerning for pneumonia with small right pleural effusion.  Patient was started on azithromycin and ceftriaxone.  Hospitalist service consulted for further evaluation and management of acute hypoxic respiratory failure secondary to Covid-19 pneumonia versus community-acquired pneumonia.   Assessment & Plan:   Principal Problem:   Sepsis ([place]) Active Problems:   COPD with acute exacerbation ([place])   COVID-19 virus infection   Primary cancer of left upper lobe of lung ([place])   Pneumonia of right upper lobe due to infectious organism   SIADH (syndrome of inappropriate ADH production) ([place])   Essential hypertension   Mixed hyperlipidemia   Acute respiratory failure with hypoxia ([name])   Sepsis, POA Acute hypoxic respiratory failure, POA Community-acquired pneumonia, POA Covid-19 viral pneumonia, POA Patient presenting to ED with progressive shortness of breath, found to be hypoxic on presentation.  Covid-19 PCR positive.  Imaging studies with right upper lobe infiltrate concerning for bacterial pneumonia.  Completed 5-day course of remdesivir and 5-day course of azithromycin and ceftriaxone. --prednisone 40 mg p.o. daily --Titrated off of supplemental oxygen --Continue vitamin C, zinc, albuterol MDI as needed, duo nebs as needed  COPD with acute exacerbation given hypoxia Patient with known history  of substantial COPD with bullous emphysema; with history of 2 spontaneous pneumothorax in the past.  On initial presentation was noted to have profound expiratory wheezing and prolonged expiratory phase. --prednisone decreased to 40 mg p.o. daily 4/15 --Continue duo nebs, albuterol MDI as needed --Goal SPO2 greater than 88%, currently titrated to 1 L nasal cannula  Staph hominis septicemia Blood cultures 3/4 on 4/9 positive for Staph hominis.  TTE 04/14/2020 with LVEF 81-27%, grade 1 diastolic dysfunction and no evidence of valvular vegetations. --Infectious disease following, appreciate assistance --Repeat blood cultures 4/12 no growth x3 days --Vancomycin changed to cefazolin 2 g IV q8h to complete 7-day course per ID, with End date 4/16  SIADH Sodium 118 at time of presentation. --Na 118>>127>129 --Continue fluid restriction 1280mL/day --BMP daily  Primary cancer of left upper lobe of lung Spiculated lung mass initial identified during hospitalization in 12/2017, initially managed with radiation therapy.  Patient has unfortunately developed another area concerning for left upper lobe malignancy, confirmed via pet imaging performed on 02/06/2020. --Continue radiation therapy per oncology  History of prostate cancer Status post radiation therapy  Essential hypertension --Amlodipine 10 mg p.o. daily --Irbesartan 300 mg p.o. daily --Hydralazine 10 mg IV every 6 hours prn SBP >180 or DBP >110 --Continue aspirin and statin  Hyperlipidemia --Atorvastatin 10 mg p.o. daily   DVT prophylaxis: Lovenox   Code Status: Full Code Family Communication: Updated patient extensively at bedside, no family present  Disposition Plan:  Level of care: Med-Surg Status is: Inpatient  Remains inpatient appropriate because:Ongoing diagnostic testing needed not appropriate for outpatient work up, Unsafe d/c plan, IV treatments appropriate due to intensity of illness or inability to take PO and Inpatient  level of care appropriate due to severity  of illness   Dispo: The patient is from: Home              Anticipated d/c is to: Home              Patient currently is not medically stable to d/c.   Difficult to place patient No   Consultants:   Infectious disease - signed off 4/14  Procedures:   TTE: LVEF 35-00%, grade 1 diastolic dysfunction, no distinct valvular vegetations  Antimicrobials:   Azithromycin 4/9 - 4/14  Ceftriaxone 4/9 - 4/14  Vancomycin 4/11 - 4/14  Cefazolin 4/14 - 4/16  .antibio   Subjective: Patient seen and examined bedside, resting comfortably.  No family present.  No questions or concerns this morning.  Discussed plan for discharge home following completion of IV antibiotics tomorrow.  Denies headache, no chest pain, no palpitations, no shortness of breath, no cough/congestion, no fever/chills/night sweats, no nausea/vomiting/diarrhea, no chest pain, no palpitations, no abdominal pain, no weakness, no fatigue, no paresthesias.  No acute events overnight per nursing staff.  Objective: Vitals:   04/15/20 0450 04/15/20 1300 04/15/20 2121 04/16/20 0604  BP: (!) 147/97 (!) 145/97 (!) 141/98 140/85  Pulse: (!) 106 (!) 105 98 95  Resp: 18  20 18   Temp: 97.7 F (36.5 C) 97.8 F (36.6 C) 97.8 F (36.6 C) 98 F (36.7 C)  TempSrc:      SpO2: 97%  97% 98%  Weight:      Height:        Intake/Output Summary (Last 24 hours) at 04/16/2020 0947 Last data filed at 04/16/2020 0644 Gross per 24 hour  Intake 300 ml  Output 400 ml  Net -100 ml   Filed Weights   04/10/20 1710 04/10/20 2120  Weight: 76.2 kg 76.2 kg    Examination:  General exam: Appears calm and comfortable  Respiratory system: Clear to auscultation. Respiratory effort normal.  On room air Cardiovascular system: S1 & S2 heard, RRR. No JVD, murmurs, rubs, gallops or clicks. No pedal edema. Gastrointestinal system: Abdomen is nondistended, soft and nontender. No organomegaly or masses felt.  Normal bowel sounds heard. Central nervous system: Alert and oriented. No focal neurological deficits. Extremities: Symmetric 5 x 5 power. Skin: No rashes, lesions or ulcers Psychiatry: Judgement and insight appear normal. Mood & affect appropriate.     Data Reviewed: I have personally reviewed following labs and imaging studies  CBC: Recent Labs  Lab 04/11/20 0202 04/12/20 0324 04/13/20 0629 04/14/20 0341 04/15/20 0336 04/16/20 0328  WBC 33.8* 36.5* 32.9* 30.5* 24.1* 23.5*  NEUTROABS 31.4* 33.4* 30.3* 27.7* 20.9*  --   HGB 11.5* 11.1* 11.5* 11.7* 11.9* 12.6*  HCT 33.0* 32.8* 34.1* 34.8* 34.4* 37.4*  MCV 87.1 88.4 87.4 88.3 86.9 88.2  PLT 493* 529* 460* 558* 589* 938*   Basic Metabolic Panel: Recent Labs  Lab 04/12/20 0324 04/13/20 0629 04/14/20 0341 04/15/20 0336 04/16/20 0328  NA 124* 125* 127* 127* 129*  K 3.4* 3.6 3.8 3.7 3.9  CL 88* 90* 92* 93* 93*  CO2 24 25 25 25 28   GLUCOSE 173* 184* 159* 154* 160*  BUN 27* 27* 27* 27* 27*  CREATININE 0.76 0.67 0.75 0.59* 0.63  CALCIUM 8.4* 8.5* 8.6* 8.5* 8.8*  MG 2.3 2.3 2.3 2.4 2.3   GFR: Estimated Creatinine Clearance: 90.8 mL/min (by C-G formula based on SCr of 0.63 mg/dL). Liver Function Tests: Recent Labs  Lab 04/10/20 1725  AST 37  ALT 35  ALKPHOS 75  BILITOT 1.4*  PROT 7.9  ALBUMIN 3.0*   No results for input(s): LIPASE, AMYLASE in the last 168 hours. No results for input(s): AMMONIA in the last 168 hours. Coagulation Profile: Recent Labs  Lab 04/10/20 1801  INR 1.3*   Cardiac Enzymes: No results for input(s): CKTOTAL, CKMB, CKMBINDEX, TROPONINI in the last 168 hours. BNP (last 3 results) No results for input(s): PROBNP in the last 8760 hours. HbA1C: No results for input(s): HGBA1C in the last 72 hours. CBG: No results for input(s): GLUCAP in the last 168 hours. Lipid Profile: No results for input(s): CHOL, HDL, LDLCALC, TRIG, CHOLHDL, LDLDIRECT in the last 72 hours. Thyroid Function  Tests: No results for input(s): TSH, T4TOTAL, FREET4, T3FREE, THYROIDAB in the last 72 hours. Anemia Panel: No results for input(s): VITAMINB12, FOLATE, FERRITIN, TIBC, IRON, RETICCTPCT in the last 72 hours. Sepsis Labs: Recent Labs  Lab 04/10/20 1801 04/10/20 2020 04/10/20 2153  PROCALCITON 1.34  --   --   LATICACIDVEN  --  1.6 1.6    Recent Results (from the past 240 hour(s))  Resp Panel by RT-PCR (Flu A&B, Covid) Nasopharyngeal Swab     Status: Abnormal   Collection Time: 04/10/20  5:35 PM   Specimen: Nasopharyngeal Swab; Nasopharyngeal(NP) swabs in vial transport medium  Result Value Ref Range Status   SARS Coronavirus 2 by RT PCR POSITIVE (A) NEGATIVE Final    Comment: RESULT CALLED TO, READ BACK BY AND VERIFIED WITH: JOHN FRICKUY RN04/09/22 @1916  BY P.HENDERSON (NOTE) SARS-CoV-2 target nucleic acids are DETECTED.  The SARS-CoV-2 RNA is generally detectable in upper respiratory specimens during the acute phase of infection. Positive results are indicative of the presence of the identified virus, but do not rule out bacterial infection or co-infection with other pathogens not detected by the test. Clinical correlation with patient history and other diagnostic information is necessary to determine patient infection status. The expected result is Negative.  Fact Sheet for Patients: EntrepreneurPulse.com.au  Fact Sheet for Healthcare Providers: IncredibleEmployment.be  This test is not yet approved or cleared by the [place] FDA and  has been authorized for detection and/or diagnosis of SARS-CoV-2 by FDA under an Emergency Use Authorization (EUA).  This EUA will remain in effect (meaning this  test can be used) for the duration of  the COVID-19 declaration under Section 564(b)(1) of the Act, 21 U.S.C. section 360bbb-3(b)(1), unless the authorization is terminated or revoked sooner.     Influenza A by PCR NEGATIVE NEGATIVE Final    Influenza B by PCR NEGATIVE NEGATIVE Final    Comment: (NOTE) The Xpert Xpress SARS-CoV-2/FLU/RSV plus assay is intended as an aid in the diagnosis of influenza from Nasopharyngeal swab specimens and should not be used as a sole basis for treatment. Nasal washings and aspirates are unacceptable for Xpert Xpress SARS-CoV-2/FLU/RSV testing.  Fact Sheet for Patients: EntrepreneurPulse.com.au  Fact Sheet for Healthcare Providers: IncredibleEmployment.be  This test is not yet approved or cleared by the [place] FDA and has been authorized for detection and/or diagnosis of SARS-CoV-2 by FDA under an Emergency Use Authorization (EUA). This EUA will remain in effect (meaning this test can be used) for the duration of the COVID-19 declaration under Section 564(b)(1) of the Act, 21 U.S.C. section 360bbb-3(b)(1), unless the authorization is terminated or revoked.  Performed at [facility], [place] [address], [place], [place] [address]   Urine culture     Status: Abnormal   Collection Time: 04/10/20  8:51 PM   Specimen:  In/Out Cath Urine  Result Value Ref Range Status   Specimen Description   Final    IN/OUT CATH URINE Performed at [facility], [place] [address], [place], [place] [address]    Special Requests   Final    NONE Performed at [facility], [place] [address] [place], [place] [address]    Culture (A)  Final    <10,000 COLONIES/mL INSIGNIFICANT GROWTH Performed at [place] [address], [place], [place] [address]    Report Status 04/12/2020 FINAL  Final  Culture, blood (single)     Status: Abnormal   Collection Time: 04/10/20  8:53 PM   Specimen: BLOOD  Result Value Ref Range Status   Specimen Description   Final    BLOOD LEFT ANTECUBITAL Performed at [place] [address] [place], [place] [address]    Special Requests   Final    BOTTLES  DRAWN AEROBIC AND ANAEROBIC Blood Culture adequate volume Performed at [place] [address] [place], [place] [address]    Culture  Setup Time   Final    GRAM POSITIVE COCCI IN BOTH AEROBIC AND ANAEROBIC BOTTLES CRITICAL RESULT CALLED TO, READ BACK BY AND VERIFIED WITH: PHARMD ELLEN JACKSON 04/11/2020 @ 2249 A.HUGHES Performed at [facility], [place] [address], [place], [place] [address]    Culture STAPHYLOCOCCUS HOMINIS (A)  Final   Report Status 04/13/2020 FINAL  Final   Organism ID, Bacteria STAPHYLOCOCCUS HOMINIS  Final      Susceptibility   Staphylococcus hominis - MIC*    CIPROFLOXACIN <=0.5 SENSITIVE Sensitive     ERYTHROMYCIN <=0.25 SENSITIVE Sensitive     GENTAMICIN <=0.5 SENSITIVE Sensitive     OXACILLIN <=0.25 SENSITIVE Sensitive     TETRACYCLINE <=1 SENSITIVE Sensitive     VANCOMYCIN <=0.5 SENSITIVE Sensitive     TRIMETH/SULFA <=10 SENSITIVE Sensitive     CLINDAMYCIN <=0.25 SENSITIVE Sensitive     RIFAMPIN <=0.5 SENSITIVE Sensitive     Inducible Clindamycin NEGATIVE Sensitive     * STAPHYLOCOCCUS HOMINIS  Blood Culture ID Panel (Reflexed)     Status: Abnormal   Collection Time: 04/10/20  8:53 PM  Result Value Ref Range Status   Enterococcus faecalis NOT DETECTED NOT DETECTED Final   Enterococcus Faecium NOT DETECTED NOT DETECTED Final   Listeria monocytogenes NOT DETECTED NOT DETECTED Final   Staphylococcus species DETECTED (A) NOT DETECTED Final    Comment: CRITICAL RESULT CALLED TO, READ BACK BY AND VERIFIED WITH: PHARMD ELLEN JACKSON 04/11/2020 @ 2249 A.HUGHES    Staphylococcus aureus (BCID) NOT DETECTED NOT DETECTED Final   Staphylococcus epidermidis NOT DETECTED NOT DETECTED Final   Staphylococcus lugdunensis NOT DETECTED NOT DETECTED Final   Streptococcus species NOT DETECTED NOT DETECTED Final   Streptococcus agalactiae NOT DETECTED NOT DETECTED Final   Streptococcus pneumoniae NOT DETECTED NOT DETECTED Final   Streptococcus  pyogenes NOT DETECTED NOT DETECTED Final   A.calcoaceticus-baumannii NOT DETECTED NOT DETECTED Final   Bacteroides fragilis NOT DETECTED NOT DETECTED Final   Enterobacterales NOT DETECTED NOT DETECTED Final   Enterobacter cloacae complex NOT DETECTED NOT DETECTED Final   Escherichia coli NOT DETECTED NOT DETECTED Final   Klebsiella aerogenes NOT DETECTED NOT DETECTED Final   Klebsiella oxytoca NOT DETECTED NOT DETECTED Final   Klebsiella pneumoniae NOT DETECTED NOT DETECTED Final   Proteus species NOT DETECTED NOT DETECTED Final   Salmonella species NOT DETECTED NOT DETECTED Final  Serratia marcescens NOT DETECTED NOT DETECTED Final   Haemophilus influenzae NOT DETECTED NOT DETECTED Final   Neisseria meningitidis NOT DETECTED NOT DETECTED Final   Pseudomonas aeruginosa NOT DETECTED NOT DETECTED Final   Stenotrophomonas maltophilia NOT DETECTED NOT DETECTED Final   Candida albicans NOT DETECTED NOT DETECTED Final   Candida auris NOT DETECTED NOT DETECTED Final   Candida glabrata NOT DETECTED NOT DETECTED Final   Candida krusei NOT DETECTED NOT DETECTED Final   Candida parapsilosis NOT DETECTED NOT DETECTED Final   Candida tropicalis NOT DETECTED NOT DETECTED Final   Cryptococcus neoformans/gattii NOT DETECTED NOT DETECTED Final    Comment: Performed at [facility], [place] [address] [place], [place] [address]  Blood Culture (routine x 2)     Status: Abnormal   Collection Time: 04/10/20  8:55 PM   Specimen: BLOOD  Result Value Ref Range Status   Specimen Description   Final    BLOOD RIGHT ANTECUBITAL Performed at [place] [address], [place], [place] [address]    Special Requests   Final    BOTTLES DRAWN AEROBIC AND ANAEROBIC Blood Culture adequate volume Performed at [place] [address], [place], [place] [address]    Culture  Setup Time   Final    GRAM POSITIVE COCCI AEROBIC BOTTLE ONLY CRITICAL RESULT CALLED TO, READ  BACK BY AND VERIFIED WITH: PHARMD Dolly Rias 04/11/2020 @2249  A.HUGHES Performed at [facility], [place] [address] [place], [place] [address]    Culture STAPHYLOCOCCUS HOMINIS (A)  Final   Report Status 04/15/2020 FINAL  Final   Organism ID, Bacteria STAPHYLOCOCCUS HOMINIS  Final      Susceptibility   Staphylococcus hominis - MIC*    CIPROFLOXACIN <=0.5 SENSITIVE Sensitive     ERYTHROMYCIN <=0.25 SENSITIVE Sensitive     GENTAMICIN <=0.5 SENSITIVE Sensitive     OXACILLIN <=0.25 SENSITIVE Sensitive     TETRACYCLINE 2 SENSITIVE Sensitive     VANCOMYCIN 1 SENSITIVE Sensitive     TRIMETH/SULFA <=10 SENSITIVE Sensitive     CLINDAMYCIN <=0.25 SENSITIVE Sensitive     RIFAMPIN <=0.5 SENSITIVE Sensitive     Inducible Clindamycin NEGATIVE Sensitive     * STAPHYLOCOCCUS HOMINIS  Blood Culture (routine x 2)     Status: None   Collection Time: 04/10/20  9:53 PM   Specimen: BLOOD  Result Value Ref Range Status   Specimen Description   Final    BLOOD BLOOD RIGHT HAND Performed at [place] [address], [place], [place] [address]    Special Requests   Final    BOTTLES DRAWN AEROBIC ONLY Blood Culture adequate volume Performed at [place] [address] [place], [place] [address]    Culture   Final    NO GROWTH 5 DAYS Performed at [facility], [place] [address], [place], [place] [address]    Report Status 04/16/2020 FINAL  Final  Culture, blood (Routine X 2) w Reflex to ID Panel     Status: None (Preliminary result)   Collection Time: 04/13/20  6:35 AM   Specimen: BLOOD  Result Value Ref Range Status   Specimen Description   Final    BLOOD RIGHT ANTECUBITAL Performed at [place] [address] [place], [place] [address]    Special Requests   Final    BOTTLES DRAWN AEROBIC AND ANAEROBIC Blood Culture adequate volume Performed at [facility], [address] [place], [place]  [address]    Culture   Final    NO GROWTH 3 DAYS Performed at [facility], [place] [address] [place] [address], [place], [place] [address]    Report Status PENDING  Incomplete  Culture, blood (Routine X 2) w Reflex to ID Panel     Status: None (Preliminary result)   Collection Time: 04/13/20  6:35 AM   Specimen: BLOOD RIGHT HAND  Result Value Ref Range Status   Specimen Description   Final    BLOOD RIGHT HAND Performed at [place] [address], [place], [place] [address]    Special Requests   Final    BOTTLES DRAWN AEROBIC ONLY Blood Culture adequate volume Performed at [place] [address], [place], [place] [address]    Culture   Final    NO GROWTH 3 DAYS Performed at [facility], [place] [address], [place], [place] [address]    Report Status PENDING  Incomplete         Radiology Studies: ECHOCARDIOGRAM COMPLETE  Result Date: 04/14/2020    ECHOCARDIOGRAM REPORT   Patient Name:   Terry Page. Date of Exam: 04/14/2020 Medical Rec #:  [id number]            Height:       69.0 in Accession #:    [id number]           Weight:       168.0 lb Date of Birth:  February 15, 1955             BSA:          1.918 m Patient Age:    65 years             BP:           129/92 mmHg Patient Gender: M                    HR:           89 bpm. Exam Location:  Inpatient Procedure: 2D Echo, Cardiac Doppler and Color Doppler Indications:    Bacteremia  History:        Patient has no prior history of Echocardiogram examinations.                 Risk Factors:Current Smoker, Hypertension and Dyslipidemia.                 Covid positive. Lung cancer. ETOH. Pneumothorax.  Sonographer:    Roseanna Rainbow RDCS Referring Phys: [id number] [place]  Sonographer Comments: Technically difficult study due to poor echo windows. IMPRESSIONS  1. Left ventricular ejection fraction, by estimation, is 65 to 70%. The left ventricle has normal function. The left ventricle has no regional wall  motion abnormalities. There is moderate concentric left ventricular hypertrophy. Left ventricular diastolic parameters are consistent with Grade I diastolic dysfunction (impaired relaxation).  2. Right ventricular systolic function is normal. The right ventricular size is normal.  3. The mitral valve is normal in structure. Trivial mitral valve regurgitation. No evidence of mitral stenosis.  4. The aortic valve has an indeterminant number of cusps. Aortic valve regurgitation is trivial. No aortic stenosis is present.  5. Aortic dilatation noted. 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  6. The inferior vena cava is normal in size with greater than 50% respiratory variability, suggesting right atrial pressure of 3 mmHg. Comparison(s):  No prior Echocardiogram. Conclusion(s)/Recommendation(s): No evidence of valvular vegetations on this transthoracic echocardiogram. Would recommend a transesophageal echocardiogram to exclude infective endocarditis if clinically indicated. FINDINGS  Left Ventricle: Left ventricular ejection fraction, by estimation, is 65 to 70%. The left ventricle has normal function. The left ventricle has no regional wall motion abnormalities. The left ventricular internal cavity size was normal in size. There is  moderate concentric left ventricular hypertrophy. Left ventricular diastolic parameters are consistent with Grade I diastolic dysfunction (impaired relaxation). Right Ventricle: The right ventricular size is normal. No increase in right ventricular wall thickness. Right ventricular systolic function is normal. Left Atrium: Left atrial size was normal in size. Right Atrium: Right atrial size was normal in size. Pericardium: There is no evidence of pericardial effusion. Mitral Valve: The mitral valve is normal in structure. Trivial mitral valve regurgitation. No evidence of mitral valve stenosis. Tricuspid Valve: The tricuspid valve is normal in structure. Tricuspid  valve regurgitation is trivial. Aortic Valve: The aortic valve has an indeterminant number of cusps. Aortic valve regurgitation is trivial. No aortic stenosis is present. Pulmonic Valve: The pulmonic valve was not well visualized. Pulmonic valve regurgitation is not visualized. Aorta: Aortic dilatation noted. 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 Venous: The inferior vena cava is normal in size with greater than 50% respiratory variability, suggesting right atrial pressure of 3 mmHg. IAS/Shunts: No atrial level shunt detected by color flow Doppler.  LEFT VENTRICLE PLAX 2D LVIDd:         3.90 cm     Diastology LVIDs:         2.20 cm     LV e' medial:    7.94 cm/s LV PW:         1.70 cm     LV E/e' medial:  7.6 LV IVS:        1.40 cm     LV e' lateral:   6.96 cm/s LVOT diam:     2.10 cm     LV E/e' lateral: 8.7 LV SV:         55 LV SV Index:   29 LVOT Area:     3.46 cm  LV Volumes (MOD) LV vol d, MOD A2C: 50.9 ml LV vol d, MOD A4C: 76.6 ml LV vol s, MOD A2C: 16.1 ml LV vol s, MOD A4C: 22.3 ml LV SV MOD A2C:     34.8 ml LV SV MOD A4C:     76.6 ml LV SV MOD BP:      44.8 ml RIGHT VENTRICLE             IVC RV S prime:     13.40 cm/s  IVC diam: 1.40 cm TAPSE (M-mode): 1.5 cm LEFT ATRIUM             Index       RIGHT ATRIUM           Index LA diam:        2.60 cm 1.36 cm/m  RA Area:     10.20 cm LA Vol (A2C):   29.9 ml 15.59 ml/m RA Volume:   18.40 ml  9.59 ml/m LA Vol (A4C):   21.0 ml 10.95 ml/m LA Biplane Vol: 25.5 ml 13.29 ml/m  AORTIC VALVE LVOT Vmax:   97.50 cm/s LVOT Vmean:  64.300 cm/s LVOT VTI:    0.160 m  AORTA Ao Root diam: 3.60 cm Ao Asc diam:  3.70 cm MITRAL VALVE MV  Area (PHT): 2.87 cm    SHUNTS MV Decel Time: 265 msec    Systemic VTI:  0.16 m MV E velocity: 60.60 cm/s  Systemic Diam: 2.10 cm MV A velocity: 82.70 cm/s MV E/A ratio:  0.73 Gwyndolyn Kaufman MD Electronically signed by Gwyndolyn Kaufman MD Signature Date/Time: 04/14/2020/1:40:21 PM    Final          Scheduled Meds: . amLODipine  10 mg Oral Daily  . vitamin C  500 mg Oral Daily  . aspirin EC  81 mg Oral Daily  . atorvastatin  20 mg Oral Daily  . enoxaparin (LOVENOX) injection  40 mg Subcutaneous Q24H  . irbesartan  300 mg Oral Daily  . predniSONE  40 mg Oral Daily  . zinc sulfate  220 mg Oral Daily   Continuous Infusions: . sodium chloride Stopped (04/11/20 0430)  .  ceFAZolin (ANCEF) IV 2 g (04/16/20 0537)     LOS: 4 days    Time spent: 36 minutes spent on chart review, discussion with nursing staff, consultants, updating family and interview/physical exam; more than 50% of that time was spent in counseling and/or coordination of care.    Laksh Hinners J [place] DO [facility] Available via Epic secure chat 7am-7pm After these hours, please refer to coverage provider listed on [email or web address] 04/16/2020, 9:47 AM

## 2020-04-16 NOTE — Progress Notes (Signed)
Physical Therapy Treatment Patient Details Name: Terry Page. MRN: [id number] DOB: [birth year] Today's Date: 04/16/2020    History of Present Illness 65 yo male presents with complaints of shortness of breath. Pt tested positive for COVID 04/10/20. [id number] with both emphysema, spontaneous pneumothorax (2019 and 2021), SIADH, HTN, hyperlipidemia, prostate cancer and left lung cancer (currently receiving radiation therapy)    PT Comments    Patient is  Mobilizing well, using RW today. Offered  For pt. To try without but pt. Preferred RW.  patient ambulating on RA . SPO2 >90%. No SOB noted.  Follow Up Recommendations  No PT follow up     Equipment Recommendations  Cane after PT tries him with it vs RW. Per chart, may have rollator that pt reported "too fast".   Recommendations for Other Services       Precautions / Restrictions Precautions Precaution Comments: monitor O2 and HR    Mobility  Bed Mobility Overal bed mobility: Modified Independent                  Transfers   Equipment used: Rolling walker (2 wheeled) Transfers: Sit to/from Stand Sit to Stand: Supervision            Ambulation/Gait Ambulation/Gait assistance: Supervision Gait Distance (Feet): 45 Feet Assistive device: Rolling walker (2 wheeled) Gait Pattern/deviations: Step-through pattern;Decreased stride length Gait velocity: decr   General Gait Details: Amb TA, no SOB, gait steady with Rw   Stairs             Wheelchair Mobility    Modified Rankin (Stroke Patients Only)       Balance   Sitting-balance support: Feet supported;No upper extremity supported Sitting balance-Leahy Scale: Good     Standing balance support: Bilateral upper extremity supported;During functional activity Standing balance-Leahy Scale: Fair Standing balance comment: reliant on UE support                            Cognition Arousal/Alertness: Awake/alert Behavior During Therapy:  Flat affect                                          Exercises      General Comments        Pertinent Vitals/Pain Pain Assessment: No/denies pain    Home Living                      Prior Function            PT Goals (current goals can now be found in the care plan section) Progress towards PT goals: Progressing toward goals    Frequency    Min 3X/week      PT Plan Current plan remains appropriate    Co-evaluation              AM-PAC PT "6 Clicks" Mobility   Outcome Measure  Help needed turning from your back to your side while in a flat bed without using bedrails?: None Help needed moving from lying on your back to sitting on the side of a flat bed without using bedrails?: None Help needed moving to and from a bed to a chair (including a wheelchair)?: A Little Help needed standing up from a chair using your arms (e.g., wheelchair or bedside chair)?: A Little Help needed to walk  in hospital room?: A Little Help needed climbing 3-5 steps with a railing? : A Little 6 Click Score: 20    End of Session   Activity Tolerance: Patient tolerated treatment well Patient left: in chair;with call bell/phone within reach Nurse Communication: Mobility status PT Visit Diagnosis: Other abnormalities of gait and mobility (R26.89);Unsteadiness on feet (R26.81)     Time: 8875-7972 PT Time Calculation (min) (ACUTE ONLY): 28 min  Charges:  $Gait Training: 23-37 mins                     Cash Pager [phone] Office [phone]    Claretha Cooper 04/16/2020, 9:49 AM

## 2020-04-16 NOTE — Progress Notes (Signed)
@   1950 called radiology to left them know that patient had finished the first bottle of contrast per pt about 30 min prior to RN coming into the room @ 1945 to tell him to start working on the second bottle, however pt states he cannot tolerate drinking the second bottle.  Radiology made note of RN notification of pt inability to tolerate the second bottle.  RN asked if they were going to take him for CT at this time and they said no, not enough time has lapsed since drinking the contrast.  RN clarified that the patient did not need to drink the second bottle if he was not going to tolerate it.  The response was not to drink it

## 2020-04-16 NOTE — Care Management Important Message (Signed)
Medicare IM printed to give to the patient, by Kimbery Harwood 

## 2020-04-16 NOTE — Progress Notes (Signed)
Physical Therapy Treatment Patient Details Name: Terry Page. MRN: [id number] DOB: 12-07-1955 Today's Date: 04/16/2020    History of Present Illness 65 yo male presents with complaints of shortness of breath. Pt tested positive for COVID 04/10/20. [id number] with both emphysema, spontaneous pneumothorax (2019 and 2021), SIADH, HTN, hyperlipidemia, prostate cancer and left lung cancer (currently receiving radiation therapy)    PT Comments    Patient assessed using a cane which does not provide enough support, patient reaching for any close surface. Compared to use of Rw this AM.  Patient now reports that he does not have assistance at home, he is alone. Reports son lives " around the corner". Spoke with patient about need for assistance currently  Due to weakness and he needs a  RW for safe ambulation. Patient is weak and slow to mobilize.  Follow Up Recommendations  SNF  Vs 24/7 assistance.    Equipment Recommendations  Rolling walker with 5" wheels    Recommendations for Other Services       Precautions / Restrictions Precautions Precautions: Fall Precaution Comments: monitor O2 and HR    Mobility  Bed Mobility         Supine to sit: Supervision Sit to supine: Min guard   General bed mobility comments: increased time, multimodal cues  to initiate.    Transfers Overall transfer level: Needs assistance Equipment used: Straight cane Transfers: Sit to/from Stand Sit to Stand: Min assist Stand pivot transfers: Mod assist       General transfer comment: required increased steady assist with cane, reaching for IV pole to  steady.  Ambulation/Gait Ambulation/Gait assistance: Min assist Gait Distance (Feet): 20 Feet Assistive device: 1 person hand held assist;Straight cane Gait Pattern/deviations: Step-to pattern;Wide base of support Gait velocity: decr   General Gait Details: unsteady  gait with single point cane. reaching for anysurface to  stabilize.   Stairs             Wheelchair Mobility    Modified Rankin (Stroke Patients Only)       Balance Overall balance assessment: Needs assistance Sitting-balance support: Feet supported;No upper extremity supported Sitting balance-Leahy Scale: Good     Standing balance support: During functional activity;Single extremity supported Standing balance-Leahy Scale: Poor Standing balance comment: reliant on UE support                            Cognition Arousal/Alertness: Awake/alert Behavior During Therapy: Flat affect                                   General Comments: patietn gives information that he is alone and not with wife who does not live with him      Exercises      General Comments        Pertinent Vitals/Pain Faces Pain Scale: Hurts a little bit Pain Location: abd Pain Intervention(s): Monitored during session    Home Living                      Prior Function            PT Goals (current goals can now be found in the care plan section) Progress towards PT goals: Progressing toward goals    Frequency    Min 3X/week      PT Plan Discharge plan needs to be updated  Co-evaluation              AM-PAC PT "6 Clicks" Mobility   Outcome Measure  Help needed turning from your back to your side while in a flat bed without using bedrails?: None Help needed moving from lying on your back to sitting on the side of a flat bed without using bedrails?: None Help needed moving to and from a bed to a chair (including a wheelchair)?: A Little Help needed standing up from a chair using your arms (e.g., wheelchair or bedside chair)?: A Little Help needed to walk in hospital room?: A Little Help needed climbing 3-5 steps with a railing? : A Little 6 Click Score: 20    End of Session Equipment Utilized During Treatment: Gait belt Activity Tolerance: Patient limited by fatigue Patient left: in  bed;with call bell/phone within reach;with nursing/sitter in room Nurse Communication: Mobility status PT Visit Diagnosis: Other abnormalities of gait and mobility (R26.89);Unsteadiness on feet (R26.81)     Time: 1555-1610 PT Time Calculation (min) (ACUTE ONLY): 15 min  Charges:                       Tresa Endo PT Acute Rehabilitation Services Pager [phone] Office [phone]    Claretha Cooper 04/16/2020, 4:37 PM

## 2020-04-16 NOTE — Plan of Care (Signed)
  Problem: Education: Goal: Knowledge of General Education information will improve Description: Including pain rating scale, medication(s)/side effects and non-pharmacologic comfort measures Outcome: Progressing   Problem: Health Behavior/Discharge Planning: Goal: Ability to manage health-related needs will improve Outcome: Progressing   Problem: Clinical Measurements: Goal: Ability to maintain clinical measurements within normal limits will improve Outcome: Progressing Goal: Will remain free from infection Outcome: Progressing Goal: Diagnostic test results will improve Outcome: Progressing Goal: Respiratory complications will improve Outcome: Progressing Goal: Cardiovascular complication will be avoided Outcome: Progressing   Problem: Activity: Goal: Risk for activity intolerance will decrease Outcome: Progressing   Problem: Nutrition: Goal: Adequate nutrition will be maintained Outcome: Progressing   Problem: Coping: Goal: Level of anxiety will decrease Outcome: Progressing   Problem: Elimination: Goal: Will not experience complications related to urinary retention Outcome: Progressing   Problem: Pain Managment: Goal: General experience of comfort will improve Outcome: Progressing   Problem: Safety: Goal: Ability to remain free from injury will improve Outcome: Progressing   Problem: Activity: Goal: Ability to tolerate increased activity will improve Outcome: Progressing   Problem: Clinical Measurements: Goal: Ability to maintain a body temperature in the normal range will improve Outcome: Progressing   Problem: Respiratory: Goal: Ability to maintain adequate ventilation will improve Outcome: Progressing Goal: Ability to maintain a clear airway will improve Outcome: Progressing   Problem: Education: Goal: Knowledge of risk factors and measures for prevention of condition will improve Outcome: Progressing   Problem: Coping: Goal: Psychosocial and  spiritual needs will be supported Outcome: Progressing   Problem: Respiratory: Goal: Complications related to the disease process, condition or treatment will be avoided or minimized Outcome: Progressing

## 2020-04-16 NOTE — TOC Progression Note (Signed)
Transition of Care (TOC) - Progression Note    Patient Details  Name: Terry Page. MRN: [id number] Date of Birth: Jun 16, 1955  Transition of Care [facility]) CM/SW Contact  Purcell Mouton, RN Phone Number: 04/16/2020, 2:40 PM  Clinical Narrative:     [facility] will follow pt at home for Home Health.       Expected Discharge Plan and Services                                                 Social Determinants of Health (SDOH) Interventions    Readmission Risk Interventions Readmission Risk Prevention Plan 10/24/2017  Post Dischage Appt Complete  Medication Screening Complete  Transportation Screening Complete  PCP follow-up Complete  Some recent data might be hidden

## 2020-04-17 DIAGNOSIS — A419 Sepsis, unspecified organism: Secondary | ICD-10-CM | POA: Diagnosis not present

## 2020-04-17 LAB — CBC
HCT: 33.3 % — ABNORMAL LOW (ref 39.0–52.0)
Hemoglobin: 11.2 g/dL — ABNORMAL LOW (ref 13.0–17.0)
MCH: 29.8 pg (ref 26.0–34.0)
MCHC: 33.6 g/dL (ref 30.0–36.0)
MCV: 88.6 fL (ref 80.0–100.0)
Platelets: 497 10*3/uL — ABNORMAL HIGH (ref 150–400)
RBC: 3.76 MIL/uL — ABNORMAL LOW (ref 4.22–5.81)
RDW: 14.1 % (ref 11.5–15.5)
WBC: 25.6 10*3/uL — ABNORMAL HIGH (ref 4.0–10.5)
nRBC: 0 % (ref 0.0–0.2)

## 2020-04-17 LAB — BASIC METABOLIC PANEL
Anion gap: 9 (ref 5–15)
BUN: 26 mg/dL — ABNORMAL HIGH (ref 8–23)
CO2: 26 mmol/L (ref 22–32)
Calcium: 8.4 mg/dL — ABNORMAL LOW (ref 8.9–10.3)
Chloride: 94 mmol/L — ABNORMAL LOW (ref 98–111)
Creatinine, Ser: 0.59 mg/dL — ABNORMAL LOW (ref 0.61–1.24)
GFR, Estimated: >60 mL/min (ref >60)
Glucose, Bld: 109 mg/dL — ABNORMAL HIGH (ref 70–99)
Potassium: 3.8 mmol/L (ref 3.5–5.1)
Sodium: 129 mmol/L — ABNORMAL LOW (ref 135–145)

## 2020-04-17 NOTE — Progress Notes (Signed)
Pt had a medium sized BM x1. Incontinent of stool. Pt is passing gas, bowel sounds active. MD made aware, advised not to proceed with placement of NG.

## 2020-04-17 NOTE — Consult Note (Signed)
CC: N/V  Requesting provider: Dr Patrecia Pour  HPI: Terry Page. is an 65 y.o. male who is here for Covid pneumonia.  He has been treated for the past 7 days and was resolving.  Yesterday he developed nausea and vomiting and abdominal distention.  A CT scan was ordered.  This shows a small bowel obstruction with transition point in the distal small bowel.  Patient has a history of COPD and left lung cancer currently receiving radiation therapy.  He is currently receiving treatment for Covid pneumonia versus community-acquired pneumonia, and is also on a prednisone taper (currently 40mg /day).  Past Medical History:  Diagnosis Date  . Alcohol abuse   . Asthma   . 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 Essential hypertension 04/10/2020  . Hemorrhoids   . Incidental lung nodule, greater than or equal to 16mm 10/22/2017   Left upper lobe - discovered on CTA  . Prostate cancer ([place])   . Spontaneous pneumothorax 10/20/2017   right  . Tobacco abuse     Past Surgical History:  Procedure Laterality Date  . BACK SURGERY    . PROSTATE BIOPSY      Family History  Problem Relation Age of Onset  . Cancer Cousin        maternal cousin  . Cancer Cousin        paternal cousin  . Cancer Cousin   . Colon polyps Neg Hx   . Pancreatic disease Neg Hx   . Pancreatic cancer Neg Hx   . Breast cancer Neg Hx   . Colon cancer Neg Hx     Social:  reports that he quit smoking about 4 months ago. His smoking use included cigarettes. He has never used smokeless tobacco. He reports current alcohol use of about 12.0 standard drinks of alcohol per week. He reports that he does not use drugs.  Allergies: No Known Allergies  Medications: I have reviewed the patient's current medications.  Results for orders placed or performed during the hospital encounter of 04/10/20 (from the past 48 hour(s))  Basic metabolic panel     Status: Abnormal   Collection Time: 04/16/20  3:28 AM  Result Value Ref Range   Sodium  129 (L) 135 - 145 mmol/L   Potassium 3.9 3.5 - 5.1 mmol/L   Chloride 93 (L) 98 - 111 mmol/L   CO2 28 22 - 32 mmol/L   Glucose, Bld 160 (H) 70 - 99 mg/dL    Comment: Glucose reference range applies only to samples taken after fasting for at least 8 hours.   BUN 27 (H) 8 - 23 mg/dL   Creatinine, Ser 0.63 0.61 - 1.24 mg/dL   Calcium 8.8 (L) 8.9 - 10.3 mg/dL   GFR, Estimated >60 >60 mL/min    Comment: (NOTE) Calculated using the CKD-EPI Creatinine Equation (2021)    Anion gap 8 5 - 15    Comment: Performed at [facility], [place] [address] [place], [place] [address]  CBC     Status: Abnormal   Collection Time: 04/16/20  3:28 AM  Result Value Ref Range   WBC 23.5 (H) 4.0 - 10.5 K/uL   RBC 4.24 4.22 - 5.81 MIL/uL   Hemoglobin 12.6 (L) 13.0 - 17.0 g/dL   HCT 37.4 (L) 39.0 - 52.0 %   MCV 88.2 80.0 - 100.0 fL   MCH 29.7 26.0 - 34.0 pg   MCHC 33.7 30.0 - 36.0 g/dL   RDW 14.3 11.5 - 15.5 %  Platelets 594 (H) 150 - 400 K/uL   nRBC 0.0 0.0 - 0.2 %    Comment: Performed at [facility], [place] [address] [place], [place] [address]  Magnesium     Status: None   Collection Time: 04/16/20  3:28 AM  Result Value Ref Range   Magnesium 2.3 1.7 - 2.4 mg/dL    Comment: Performed at [facility], [place] [address] [place], [place] [address]  CBC     Status: Abnormal   Collection Time: 04/17/20  3:22 AM  Result Value Ref Range   WBC 25.6 (H) 4.0 - 10.5 K/uL   RBC 3.76 (L) 4.22 - 5.81 MIL/uL   Hemoglobin 11.2 (L) 13.0 - 17.0 g/dL   HCT 33.3 (L) 39.0 - 52.0 %   MCV 88.6 80.0 - 100.0 fL   MCH 29.8 26.0 - 34.0 pg   MCHC 33.6 30.0 - 36.0 g/dL   RDW 14.1 11.5 - 15.5 %   Platelets 497 (H) 150 - 400 K/uL   nRBC 0.0 0.0 - 0.2 %    Comment: Performed at [facility], [place] [address] [place], [place] [address]  Basic metabolic panel     Status: Abnormal   Collection Time: 04/17/20  3:22 AM  Result Value Ref Range   Sodium 129 (L)  135 - 145 mmol/L   Potassium 3.8 3.5 - 5.1 mmol/L   Chloride 94 (L) 98 - 111 mmol/L   CO2 26 22 - 32 mmol/L   Glucose, Bld 109 (H) 70 - 99 mg/dL    Comment: Glucose reference range applies only to samples taken after fasting for at least 8 hours.   BUN 26 (H) 8 - 23 mg/dL   Creatinine, Ser 0.59 (L) 0.61 - 1.24 mg/dL   Calcium 8.4 (L) 8.9 - 10.3 mg/dL   GFR, Estimated >60 >60 mL/min    Comment: (NOTE) Calculated using the CKD-EPI Creatinine Equation (2021)    Anion gap 9 5 - 15    Comment: Performed at [facility], [place] [address] [place], [place] [address]    DG Abd 1 View  Result Date: 04/16/2020 CLINICAL DATA:  Abdominal pain EXAM: ABDOMEN - 1 VIEW COMPARISON:  None. FINDINGS: Diffuse gaseous distention of bowel, most prominent in the small bowel. Favor ileus. No organomegaly or free air. No suspicious calcification. IMPRESSION: Diffuse gaseous distention of bowel, favor ileus. Electronically Signed   By: Rolm Baptise M.D.   On: 04/16/2020 17:44   CT ABDOMEN PELVIS W CONTRAST  Result Date: 04/16/2020 CLINICAL DATA:  Abdominal distension, short of breath, history of left upper lobe lung cancer EXAM: CT ABDOMEN AND PELVIS WITH CONTRAST TECHNIQUE: Multidetector CT imaging of the abdomen and pelvis was performed using the standard protocol following bolus administration of intravenous contrast. CONTRAST:  152mL OMNIPAQUE IOHEXOL 300 MG/ML  SOLN COMPARISON:  04/16/2020, 02/06/2020 FINDINGS: Lower chest: Bullous emphysematous changes are again noted. There is a new cavitating left lower lobe pulmonary nodule, measuring 1.9 x 1.0 cm image 19/4, consistent with new metastatic disease. Nonspecific subpleural nodule within the left lower lobe is also new since prior study, measuring approximately 0.9 x 0.5 cm. Hepatobiliary: Small hepatic hypodensities consistent with cysts, stable. No biliary dilation. The gallbladder is unremarkable. Pancreas: Unremarkable. No pancreatic ductal  dilatation or surrounding inflammatory changes. Spleen: Stable atrophic spleen. Adrenals/Urinary Tract: There are stable bilateral renal cortical cysts. No urinary tract calculi or obstructive uropathy. The adrenals and bladder are unremarkable. Stomach/Bowel:  There is marked dilation of the proximal small bowel, measuring up to 3.9 cm in diameter, with multiple gas fluid levels. Transition within the mid to distal jejunum within the right mid abdomen consistent with small-bowel obstruction. There is no bowel wall thickening. Normal appendix right lower quadrant. Colon is decompressed, with scattered diverticulosis. Vascular/Lymphatic: Aortic atherosclerosis. No enlarged abdominal or pelvic lymph nodes. Reproductive: Prostate is unremarkable. Other: Trace free fluid within the lower abdomen and pelvis. No free intraperitoneal gas. No abdominal wall hernia. Musculoskeletal: There are no acute or destructive bony lesions. Significant bilateral hip osteoarthritis. Reconstructed images demonstrate no additional findings. IMPRESSION: 1. High-grade small bowel obstruction, with transition in the mid to distal jejunum in the right mid abdomen. 2. New left lower lobe pulmonary nodules, the larger of which demonstrates central cavitation. Findings are concerning for progressive metastatic lung cancer. These nodules are new since prior PET-CT 02/06/2020. 3. Trace free fluid. 4.  Aortic Atherosclerosis (ICD10-I70.0). Electronically Signed   By: Randa Ngo M.D.   On: 04/16/2020 22:28    ROS - all of the below systems have been reviewed with the patient and positives are indicated with bold text General: chills, fever or night sweats Eyes: blurry vision or double vision ENT: epistaxis or sore throat Allergy/Immunology: itchy/watery eyes or nasal congestion Hematologic/Lymphatic: bleeding problems, blood clots or swollen lymph nodes Endocrine: temperature intolerance or unexpected weight changes Breast: new or  changing breast lumps or nipple discharge Resp: cough, shortness of breath, or wheezing CV: chest pain or dyspnea on exertion GI: as per HPI GU: dysuria, trouble voiding, or hematuria MSK: joint pain or joint stiffness Neuro: TIA or stroke symptoms Derm: pruritus and skin lesion changes Psych: anxiety and depression  PE Blood pressure (!) 154/95, pulse (!) 102, temperature 98.4 F (36.9 C), temperature source Oral, resp. rate 20, height 5\' 9"  (1.753 m), weight 76.2 kg, SpO2 94 %. Constitutional: NAD; conversant; no deformities Eyes: Moist conjunctiva; no lid lag; anicteric; PERRL Neck: Trachea midline; no thyromegaly Lungs: Normal respiratory effort; no tactile fremitus CV: RRR; no palpable thrills; no pitting edema GI: Abd distended; no palpable hepatosplenomegaly MSK: Normal range of motion of extremities Psychiatric: Appropriate affect; alert and oriented x3 Lymphatic: No palpable cervical or axillary lymphadenopathy  Results for orders placed or performed during the hospital encounter of 04/10/20 (from the past 48 hour(s))  Basic metabolic panel     Status: Abnormal   Collection Time: 04/16/20  3:28 AM  Result Value Ref Range   Sodium 129 (L) 135 - 145 mmol/L   Potassium 3.9 3.5 - 5.1 mmol/L   Chloride 93 (L) 98 - 111 mmol/L   CO2 28 22 - 32 mmol/L   Glucose, Bld 160 (H) 70 - 99 mg/dL    Comment: Glucose reference range applies only to samples taken after fasting for at least 8 hours.   BUN 27 (H) 8 - 23 mg/dL   Creatinine, Ser 0.63 0.61 - 1.24 mg/dL   Calcium 8.8 (L) 8.9 - 10.3 mg/dL   GFR, Estimated >60 >60 mL/min    Comment: (NOTE) Calculated using the CKD-EPI Creatinine Equation (2021)    Anion gap 8 5 - 15    Comment: Performed at [facility], [place] [address], [place], [place] [address]  CBC     Status: Abnormal   Collection Time: 04/16/20  3:28 AM  Result Value Ref Range   WBC 23.5 (H) 4.0 - 10.5 K/uL   RBC 4.24 4.22 - 5.81 MIL/uL  Hemoglobin 12.6 (L) 13.0 - 17.0 g/dL   HCT 37.4 (L) 39.0 - 52.0 %   MCV 88.2 80.0 - 100.0 fL   MCH 29.7 26.0 - 34.0 pg   MCHC 33.7 30.0 - 36.0 g/dL   RDW 14.3 11.5 - 15.5 %   Platelets 594 (H) 150 - 400 K/uL   nRBC 0.0 0.0 - 0.2 %    Comment: Performed at [facility], [place] [address], [place], [place] [address]  Magnesium     Status: None   Collection Time: 04/16/20  3:28 AM  Result Value Ref Range   Magnesium 2.3 1.7 - 2.4 mg/dL    Comment: Performed at [facility], [place] [address], [place], [place] [address]  CBC     Status: Abnormal   Collection Time: 04/17/20  3:22 AM  Result Value Ref Range   WBC 25.6 (H) 4.0 - 10.5 K/uL   RBC 3.76 (L) 4.22 - 5.81 MIL/uL   Hemoglobin 11.2 (L) 13.0 - 17.0 g/dL   HCT 33.3 (L) 39.0 - 52.0 %   MCV 88.6 80.0 - 100.0 fL   MCH 29.8 26.0 - 34.0 pg   MCHC 33.6 30.0 - 36.0 g/dL   RDW 14.1 11.5 - 15.5 %   Platelets 497 (H) 150 - 400 K/uL   nRBC 0.0 0.0 - 0.2 %    Comment: Performed at [facility], [place] [address], [place], [place] [address]  Basic metabolic panel     Status: Abnormal   Collection Time: 04/17/20  3:22 AM  Result Value Ref Range   Sodium 129 (L) 135 - 145 mmol/L   Potassium 3.8 3.5 - 5.1 mmol/L   Chloride 94 (L) 98 - 111 mmol/L   CO2 26 22 - 32 mmol/L   Glucose, Bld 109 (H) 70 - 99 mg/dL    Comment: Glucose reference range applies only to samples taken after fasting for at least 8 hours.   BUN 26 (H) 8 - 23 mg/dL   Creatinine, Ser 0.59 (L) 0.61 - 1.24 mg/dL   Calcium 8.4 (L) 8.9 - 10.3 mg/dL   GFR, Estimated >60 >60 mL/min    Comment: (NOTE) Calculated using the CKD-EPI Creatinine Equation (2021)    Anion gap 9 5 - 15    Comment: Performed at [facility], [place] [address] [place], [place] [address]    DG Abd 1 View  Result Date: 04/16/2020 CLINICAL DATA:  Abdominal pain EXAM: ABDOMEN - 1 VIEW COMPARISON:  None. FINDINGS: Diffuse gaseous distention of  bowel, most prominent in the small bowel. Favor ileus. No organomegaly or free air. No suspicious calcification. IMPRESSION: Diffuse gaseous distention of bowel, favor ileus. Electronically Signed   By: Rolm Baptise M.D.   On: 04/16/2020 17:44   CT ABDOMEN PELVIS W CONTRAST  Result Date: 04/16/2020 CLINICAL DATA:  Abdominal distension, short of breath, history of left upper lobe lung cancer EXAM: CT ABDOMEN AND PELVIS WITH CONTRAST TECHNIQUE: Multidetector CT imaging of the abdomen and pelvis was performed using the standard protocol following bolus administration of intravenous contrast. CONTRAST:  150mL OMNIPAQUE IOHEXOL 300 MG/ML  SOLN COMPARISON:  04/16/2020, 02/06/2020 FINDINGS: Lower chest: Bullous emphysematous changes are again noted. There is a new cavitating left lower lobe pulmonary nodule, measuring 1.9 x 1.0 cm image 19/4, consistent with new metastatic disease. Nonspecific subpleural nodule within the left lower lobe is also new since prior study, measuring approximately 0.9 x 0.5 cm. Hepatobiliary: Small hepatic  hypodensities consistent with cysts, stable. No biliary dilation. The gallbladder is unremarkable. Pancreas: Unremarkable. No pancreatic ductal dilatation or surrounding inflammatory changes. Spleen: Stable atrophic spleen. Adrenals/Urinary Tract: There are stable bilateral renal cortical cysts. No urinary tract calculi or obstructive uropathy. The adrenals and bladder are unremarkable. Stomach/Bowel: There is marked dilation of the proximal small bowel, measuring up to 3.9 cm in diameter, with multiple gas fluid levels. Transition within the mid to distal jejunum within the right mid abdomen consistent with small-bowel obstruction. There is no bowel wall thickening. Normal appendix right lower quadrant. Colon is decompressed, with scattered diverticulosis. Vascular/Lymphatic: Aortic atherosclerosis. No enlarged abdominal or pelvic lymph nodes. Reproductive: Prostate is unremarkable.  Other: Trace free fluid within the lower abdomen and pelvis. No free intraperitoneal gas. No abdominal wall hernia. Musculoskeletal: There are no acute or destructive bony lesions. Significant bilateral hip osteoarthritis. Reconstructed images demonstrate no additional findings. IMPRESSION: 1. High-grade small bowel obstruction, with transition in the mid to distal jejunum in the right mid abdomen. 2. New left lower lobe pulmonary nodules, the larger of which demonstrates central cavitation. Findings are concerning for progressive metastatic lung cancer. These nodules are new since prior PET-CT 02/06/2020. 3. Trace free fluid. 4.  Aortic Atherosclerosis (ICD10-I70.0). Electronically Signed   By: Randa Ngo M.D.   On: 04/16/2020 22:28     A/P: Daiwik Buffalo. is an 65 y.o. male with COVID-19 and pneumonia currently on a steroid taper who now appears to have a small bowel obstruction with no history of abdominal surgery.  Patient is a poor surgical candidate due to his recent illness and steroid use.  Recommend placing NG tube for decompression.  We will plan on starting a small bowel obstruction protocol tomorrow morning.   Rosario Adie, MD  [facility]

## 2020-04-17 NOTE — Progress Notes (Signed)
PROGRESS NOTE [facility]   Terry Page.   YBO:[id number] DOB: 1955/11/24  DOA: 04/10/2020 PCP: [name], MD   Brief Narrative:  Terry Page.  is a 65 year old male with past medical history significant for COPD, spontaneous pneumothorax (2019 and 2021), SIADH, essential hypertension, hyperlipidemia, prostate cancer, left lung cancer currently on radiation therapy presented to [facility] long ED with complaints of shortness of breath.  In the ED, patient was found to be hypoxic, Covid-19 PCR found to be positive.  Imaging studies notable for extensive patchy infiltrates right upper lobe concerning for pneumonia with small right pleural effusion.  Patient was started on azithromycin and ceftriaxone.  Hospitalist service consulted for further evaluation and management of acute hypoxic respiratory failure secondary to Covid-19 pneumonia versus community-acquired pneumonia.  Subjective: Patient seen and examined, he is complaining of abdominal distention, CT scan of the abdomen and pelvis last night showed SBO.  This morning around 10 AM he had a medium sized bowel of movement and he is passing gas now.  NG tube was not placed. Completing antibiotics and steroid taper.  Currently n.p.o. He is afebrile.  Assessment & Plan:   Principal Problem:   Sepsis ([place]) Active Problems:   COPD with acute exacerbation ([place])   COVID-19 virus infection   Primary cancer of left upper lobe of lung ([place])   Pneumonia of right upper lobe due to infectious organism   SIADH (syndrome of inappropriate ADH production) ([place])   Essential hypertension   Mixed hyperlipidemia   Acute respiratory failure with hypoxia ([name])  SBO CT abdomen and pelvis showed high-grade small bowel obstruction at the transition point.  Surgical recommendations appreciated.  NG tube was placed on hold as patient had medium bowel movement and is passing gas.  If he developed pain or abdomen distention further will  recommend placing NG tube.  Continue management per surgery.   Sepsis, POA Acute hypoxic respiratory failure, POA Community-acquired pneumonia, POA Covid-19 viral pneumonia, POA Completed 5 days of remdesivir, azithromycin and ceftriaxone, currently on prednisone taper.  He is off supplemental oxygen.  Continue duo nebs as needed.  This has resolved.  COPD with acute exacerbation given hypoxia Patient with known history of substantial COPD with bullous emphysema; with history of 2 spontaneous pneumothorax in the past.  Currently stable, he completed prednisone taper.  Continue home medications.  Staph hominis septicemia Blood cultures 3/4 on 4/9 positive for Staph hominis.  TTE 04/14/2020 with LVEF 27-78%, grade 1 diastolic dysfunction and no evidence of valvular vegetations. --Infectious disease following, appreciate assistance --Repeat blood cultures 4/12 no growth x3 days --Vancomycin changed to cefazolin 2 g IV q8h to complete 7-day course per ID, with end date 4/16  SIADH --Na 118>>127>129>129 --Continue fluid restriction 1247mL/day --BMP daily  Primary cancer of left upper lobe of lung Spiculated lung mass initial identified during hospitalization in 12/2017, initially managed with radiation therapy.  Patient has unfortunately developed another area concerning for left upper lobe malignancy, confirmed via pet imaging performed on 02/06/2020. --Continue radiation therapy per oncology  History of prostate cancer Status post radiation therapy  Essential hypertension --Amlodipine 10 mg p.o. daily --Irbesartan 300 mg p.o. daily --Hydralazine 10 mg IV every 6 hours prn SBP >180 or DBP >110 --Continue aspirin and statin  Hyperlipidemia --Atorvastatin 10 mg p.o. daily   DVT prophylaxis: Lovenox Code Status: Full code Family Communication: None at bedside Disposition Plan: Pending resolution of SBO 1 to 2 days, will discharge to SNF versus home  with  24/7assistance.  Consultants:   Infectious disease -signed off 4/14  General surgery  Procedures:   TTE  Antimicrobials: Anti-infectives (From admission, onward)   Start     Dose/Rate Route Frequency Ordered Stop   04/15/20 1400  ceFAZolin (ANCEF) IVPB 2g/100 mL premix        2 g 200 mL/hr over 30 Minutes Intravenous Every 8 hours 04/15/20 0922 04/17/20 0000   04/13/20 0600  vancomycin (VANCOREADY) IVPB 1250 mg/250 mL  Status:  Discontinued        1,250 mg 166.7 mL/hr over 90 Minutes Intravenous Every 12 hours 04/12/20 1347 04/15/20 0922   04/12/20 1430  vancomycin (VANCOREADY) IVPB 1500 mg/300 mL        1,500 mg 150 mL/hr over 120 Minutes Intravenous  Once 04/12/20 1342 04/12/20 1702   04/11/20 2000  cefTRIAXone (ROCEPHIN) 2 g in sodium chloride 0.9 % 100 mL IVPB  Status:  Discontinued        2 g 200 mL/hr over 30 Minutes Intravenous Every 24 hours 04/10/20 2119 04/15/20 0922   04/11/20 1000  remdesivir 100 mg in sodium chloride 0.9 % 100 mL IVPB       "Followed by" Linked Group Details   100 mg 200 mL/hr over 30 Minutes Intravenous Daily 04/10/20 2011 04/14/20 1100   04/10/20 2200  remdesivir 200 mg in sodium chloride 0.9% 250 mL IVPB       "Followed by" Linked Group Details   200 mg 580 mL/hr over 30 Minutes Intravenous Once 04/10/20 2011 04/11/20 0002   04/10/20 1915  cefTRIAXone (ROCEPHIN) 2 g in sodium chloride 0.9 % 100 mL IVPB  Status:  Discontinued        2 g 200 mL/hr over 30 Minutes Intravenous Every 24 hours 04/10/20 1909 04/10/20 2119   04/10/20 1915  azithromycin (ZITHROMAX) 500 mg in sodium chloride 0.9 % 250 mL IVPB  Status:  Discontinued        500 mg 250 mL/hr over 60 Minutes Intravenous Every 24 hours 04/10/20 1909 04/15/20 0922        Objective: Vitals:   04/16/20 2234 04/17/20 0510 04/17/20 0916 04/17/20 1335  BP: (!) 146/88 (!) 154/95 120/87 132/82  Pulse:  (!) 102 100 95  Resp:  20  18  Temp: 98.5 F (36.9 C) 98.4 F (36.9 C)  98.3 F (36.8  C)  TempSrc: Oral Oral  Oral  SpO2: 97% 94%  96%  Weight:      Height:        Intake/Output Summary (Last 24 hours) at 04/17/2020 1731 Last data filed at 04/17/2020 1400 Gross per 24 hour  Intake 1033.75 ml  Output 1025 ml  Net 8.75 ml   Filed Weights   04/10/20 1710 04/10/20 2120  Weight: 76.2 kg 76.2 kg    Examination:  General exam: Chronically ill looking HEENT: AC/AT, PERRLA, OP moist and clear Respiratory system: Clear to auscultation. No wheezes,crackle or rhonchi Cardiovascular system: S1 & S2 heard, RRR. No JVD, murmurs, rubs or gallops Gastrointestinal system: Abdomen is distended, soft mild tenderness on epigastric area.  No organomegaly or masses felt.  Distant bowel sounds noted. Central nervous system: Alert and oriented. No focal neurological deficits. Extremities: No pedal edema. Symmetric, strength 5/5   Skin: No rashes, lesions or ulcers Psychiatry: Judgement and insight appear normal. Mood & affect appropriate.    Data Reviewed: I have personally reviewed following labs and imaging studies  CBC: Recent Labs  Lab 04/11/20 0202 04/12/20  1607 04/13/20 3710 04/14/20 0341 04/15/20 0336 04/16/20 0328 04/17/20 0322  WBC 33.8* 36.5* 32.9* 30.5* 24.1* 23.5* 25.6*  NEUTROABS 31.4* 33.4* 30.3* 27.7* 20.9*  --   --   HGB 11.5* 11.1* 11.5* 11.7* 11.9* 12.6* 11.2*  HCT 33.0* 32.8* 34.1* 34.8* 34.4* 37.4* 33.3*  MCV 87.1 88.4 87.4 88.3 86.9 88.2 88.6  PLT 493* 529* 460* 558* 589* 594* 626*   Basic Metabolic Panel: Recent Labs  Lab 04/12/20 0324 04/13/20 0629 04/14/20 0341 04/15/20 0336 04/16/20 0328 04/17/20 0322  NA 124* 125* 127* 127* 129* 129*  K 3.4* 3.6 3.8 3.7 3.9 3.8  CL 88* 90* 92* 93* 93* 94*  CO2 24 25 25 25 28 26   GLUCOSE 173* 184* 159* 154* 160* 109*  BUN 27* 27* 27* 27* 27* 26*  CREATININE 0.76 0.67 0.75 0.59* 0.63 0.59*  CALCIUM 8.4* 8.5* 8.6* 8.5* 8.8* 8.4*  MG 2.3 2.3 2.3 2.4 2.3  --    GFR: Estimated Creatinine Clearance: 90.8  mL/min (A) (by C-G formula based on SCr of 0.59 mg/dL (L)). Liver Function Tests: No results for input(s): AST, ALT, ALKPHOS, BILITOT, PROT, ALBUMIN in the last 168 hours. No results for input(s): LIPASE, AMYLASE in the last 168 hours. No results for input(s): AMMONIA in the last 168 hours. Coagulation Profile: Recent Labs  Lab 04/10/20 1801  INR 1.3*   Cardiac Enzymes: No results for input(s): CKTOTAL, CKMB, CKMBINDEX, TROPONINI in the last 168 hours. BNP (last 3 results) No results for input(s): PROBNP in the last 8760 hours. HbA1C: No results for input(s): HGBA1C in the last 72 hours. CBG: No results for input(s): GLUCAP in the last 168 hours. Lipid Profile: No results for input(s): CHOL, HDL, LDLCALC, TRIG, CHOLHDL, LDLDIRECT in the last 72 hours. Thyroid Function Tests: No results for input(s): TSH, T4TOTAL, FREET4, T3FREE, THYROIDAB in the last 72 hours. Anemia Panel: No results for input(s): VITAMINB12, FOLATE, FERRITIN, TIBC, IRON, RETICCTPCT in the last 72 hours. Sepsis Labs: Recent Labs  Lab 04/10/20 1801 04/10/20 2020 04/10/20 2153  PROCALCITON 1.34  --   --   LATICACIDVEN  --  1.6 1.6    Recent Results (from the past 240 hour(s))  Resp Panel by RT-PCR (Flu A&B, Covid) Nasopharyngeal Swab     Status: Abnormal   Collection Time: 04/10/20  5:35 PM   Specimen: Nasopharyngeal Swab; Nasopharyngeal(NP) swabs in vial transport medium  Result Value Ref Range Status   SARS Coronavirus 2 by RT PCR POSITIVE (A) NEGATIVE Final    Comment: RESULT CALLED TO, READ BACK BY AND VERIFIED WITH: JOHN FRICKUY RN04/09/22 @1916  BY P.HENDERSON (NOTE) SARS-CoV-2 target nucleic acids are DETECTED.  The SARS-CoV-2 RNA is generally detectable in upper respiratory specimens during the acute phase of infection. Positive results are indicative of the presence of the identified virus, but do not rule out bacterial infection or co-infection with other pathogens not detected by the test.  Clinical correlation with patient history and other diagnostic information is necessary to determine patient infection status. The expected result is Negative.  Fact Sheet for Patients: EntrepreneurPulse.com.au  Fact Sheet for Healthcare Providers: IncredibleEmployment.be  This test is not yet approved or cleared by the [place] FDA and  has been authorized for detection and/or diagnosis of SARS-CoV-2 by FDA under an Emergency Use Authorization (EUA).  This EUA will remain in effect (meaning this  test can be used) for the duration of  the COVID-19 declaration under Section 564(b)(1) of the Act, 21 U.S.C. section 360bbb-3(b)(1),  unless the authorization is terminated or revoked sooner.     Influenza A by PCR NEGATIVE NEGATIVE Final   Influenza B by PCR NEGATIVE NEGATIVE Final    Comment: (NOTE) The Xpert Xpress SARS-CoV-2/FLU/RSV plus assay is intended as an aid in the diagnosis of influenza from Nasopharyngeal swab specimens and should not be used as a sole basis for treatment. Nasal washings and aspirates are unacceptable for Xpert Xpress SARS-CoV-2/FLU/RSV testing.  Fact Sheet for Patients: EntrepreneurPulse.com.au  Fact Sheet for Healthcare Providers: IncredibleEmployment.be  This test is not yet approved or cleared by the [place] FDA and has been authorized for detection and/or diagnosis of SARS-CoV-2 by FDA under an Emergency Use Authorization (EUA). This EUA will remain in effect (meaning this test can be used) for the duration of the COVID-19 declaration under Section 564(b)(1) of the Act, 21 U.S.C. section 360bbb-3(b)(1), unless the authorization is terminated or revoked.  Performed at [facility], [place] [address] [place], [place] [address]   Urine culture     Status: Abnormal   Collection Time: 04/10/20  8:51 PM   Specimen: In/Out Cath Urine  Result Value Ref  Range Status   Specimen Description   Final    IN/OUT CATH URINE Performed at [place] [address], [place], [place] [address]    Special Requests   Final    NONE Performed at [facility], [place] [address] [place], [place] [address]    Culture (A)  Final    <10,000 COLONIES/mL INSIGNIFICANT GROWTH Performed at [place] [address], [place], [place] [address]    Report Status 04/12/2020 FINAL  Final  Culture, blood (single)     Status: Abnormal   Collection Time: 04/10/20  8:53 PM   Specimen: BLOOD  Result Value Ref Range Status   Specimen Description   Final    BLOOD LEFT ANTECUBITAL Performed at [place] [address], [place], [place] [address]    Special Requests   Final    BOTTLES DRAWN AEROBIC AND ANAEROBIC Blood Culture adequate volume Performed at [place] [address], [place], [place] [address]    Culture  Setup Time   Final    GRAM POSITIVE COCCI IN BOTH AEROBIC AND ANAEROBIC BOTTLES CRITICAL RESULT CALLED TO, READ BACK BY AND VERIFIED WITH: PHARMD ELLEN JACKSON 04/11/2020 @ 2249 A.HUGHES Performed at [facility], [place] [address] [place], [place] [address]    Culture STAPHYLOCOCCUS HOMINIS (A)  Final   Report Status 04/13/2020 FINAL  Final   Organism ID, Bacteria STAPHYLOCOCCUS HOMINIS  Final      Susceptibility   Staphylococcus hominis - MIC*    CIPROFLOXACIN <=0.5 SENSITIVE Sensitive     ERYTHROMYCIN <=0.25 SENSITIVE Sensitive     GENTAMICIN <=0.5 SENSITIVE Sensitive     OXACILLIN <=0.25 SENSITIVE Sensitive     TETRACYCLINE <=1 SENSITIVE Sensitive     VANCOMYCIN <=0.5 SENSITIVE Sensitive     TRIMETH/SULFA <=10 SENSITIVE Sensitive     CLINDAMYCIN <=0.25 SENSITIVE Sensitive     RIFAMPIN <=0.5 SENSITIVE Sensitive     Inducible Clindamycin NEGATIVE Sensitive     * STAPHYLOCOCCUS HOMINIS  Blood Culture ID Panel (Reflexed)     Status: Abnormal    Collection Time: 04/10/20  8:53 PM  Result Value Ref Range Status   Enterococcus faecalis NOT DETECTED NOT DETECTED Final   Enterococcus Faecium NOT DETECTED NOT DETECTED Final   Listeria monocytogenes  NOT DETECTED NOT DETECTED Final   Staphylococcus species DETECTED (A) NOT DETECTED Final    Comment: CRITICAL RESULT CALLED TO, READ BACK BY AND VERIFIED WITH: PHARMD ELLEN JACKSON 04/11/2020 @ 2249 A.HUGHES    Staphylococcus aureus (BCID) NOT DETECTED NOT DETECTED Final   Staphylococcus epidermidis NOT DETECTED NOT DETECTED Final   Staphylococcus lugdunensis NOT DETECTED NOT DETECTED Final   Streptococcus species NOT DETECTED NOT DETECTED Final   Streptococcus agalactiae NOT DETECTED NOT DETECTED Final   Streptococcus pneumoniae NOT DETECTED NOT DETECTED Final   Streptococcus pyogenes NOT DETECTED NOT DETECTED Final   A.calcoaceticus-baumannii NOT DETECTED NOT DETECTED Final   Bacteroides fragilis NOT DETECTED NOT DETECTED Final   Enterobacterales NOT DETECTED NOT DETECTED Final   Enterobacter cloacae complex NOT DETECTED NOT DETECTED Final   Escherichia coli NOT DETECTED NOT DETECTED Final   Klebsiella aerogenes NOT DETECTED NOT DETECTED Final   Klebsiella oxytoca NOT DETECTED NOT DETECTED Final   Klebsiella pneumoniae NOT DETECTED NOT DETECTED Final   Proteus species NOT DETECTED NOT DETECTED Final   Salmonella species NOT DETECTED NOT DETECTED Final   Serratia marcescens NOT DETECTED NOT DETECTED Final   Haemophilus influenzae NOT DETECTED NOT DETECTED Final   Neisseria meningitidis NOT DETECTED NOT DETECTED Final   Pseudomonas aeruginosa NOT DETECTED NOT DETECTED Final   Stenotrophomonas maltophilia NOT DETECTED NOT DETECTED Final   Candida albicans NOT DETECTED NOT DETECTED Final   Candida auris NOT DETECTED NOT DETECTED Final   Candida glabrata NOT DETECTED NOT DETECTED Final   Candida krusei NOT DETECTED NOT DETECTED Final   Candida parapsilosis NOT DETECTED NOT DETECTED  Final   Candida tropicalis NOT DETECTED NOT DETECTED Final   Cryptococcus neoformans/gattii NOT DETECTED NOT DETECTED Final    Comment: Performed at [facility], [address] [place], [place] [address]  Blood Culture (routine x 2)     Status: Abnormal   Collection Time: 04/10/20  8:55 PM   Specimen: BLOOD  Result Value Ref Range Status   Specimen Description   Final    BLOOD RIGHT ANTECUBITAL Performed at [place] [address] [place], [place] [address]    Special Requests   Final    BOTTLES DRAWN AEROBIC AND ANAEROBIC Blood Culture adequate volume Performed at [place] [address], [place], [place] [address]    Culture  Setup Time   Final    GRAM POSITIVE COCCI AEROBIC BOTTLE ONLY CRITICAL RESULT CALLED TO, READ BACK BY AND VERIFIED WITH: PHARMD ELLEN JACKSON 04/11/2020 @2249  A.HUGHES Performed at [place] [address] [place], [place] [address]    Culture STAPHYLOCOCCUS HOMINIS (A)  Final   Report Status 04/15/2020 FINAL  Final   Organism ID, Bacteria STAPHYLOCOCCUS HOMINIS  Final      Susceptibility   Staphylococcus hominis - MIC*    CIPROFLOXACIN <=0.5 SENSITIVE Sensitive     ERYTHROMYCIN <=0.25 SENSITIVE Sensitive     GENTAMICIN <=0.5 SENSITIVE Sensitive     OXACILLIN <=0.25 SENSITIVE Sensitive     TETRACYCLINE 2 SENSITIVE Sensitive     VANCOMYCIN 1 SENSITIVE Sensitive     TRIMETH/SULFA <=10 SENSITIVE Sensitive     CLINDAMYCIN <=0.25 SENSITIVE Sensitive     RIFAMPIN <=0.5 SENSITIVE Sensitive     Inducible Clindamycin NEGATIVE Sensitive     * STAPHYLOCOCCUS HOMINIS  Blood Culture (routine x 2)     Status: None   Collection Time: 04/10/20  9:53 PM   Specimen: BLOOD  Result Value Ref Range Status   Specimen Description   Final    BLOOD BLOOD RIGHT HAND Performed at [place] [address], [place], [place] [address]    Special Requests   Final    BOTTLES DRAWN AEROBIC  ONLY Blood Culture adequate volume Performed at [place] [address], [place], [place] [address]    Culture   Final    NO GROWTH 5 DAYS Performed at [facility], [place] [address] [place], [place] [address]    Report Status 04/16/2020 FINAL  Final  Culture, blood (Routine X 2) w Reflex to ID Panel     Status: None (Preliminary result)   Collection Time: 04/13/20  6:35 AM   Specimen: BLOOD  Result Value Ref Range Status   Specimen Description   Final    BLOOD RIGHT ANTECUBITAL Performed at [place] [address], [place], [place] [address]    Special Requests   Final    BOTTLES DRAWN AEROBIC AND ANAEROBIC Blood Culture adequate volume Performed at [place] [address], [place], [place] [address]    Culture   Final    NO GROWTH 4 DAYS Performed at [facility], [place] [address], [place], [place] [address]    Report Status PENDING  Incomplete  Culture, blood (Routine X 2) w Reflex to ID Panel     Status: None (Preliminary result)   Collection Time: 04/13/20  6:35 AM   Specimen: BLOOD RIGHT HAND  Result Value Ref Range Status   Specimen Description   Final    BLOOD RIGHT HAND Performed at [place] [address], [place], [place] [address]    Special Requests   Final    BOTTLES DRAWN AEROBIC ONLY Blood Culture adequate volume Performed at [place] [address], [place], [place] [address]    Culture   Final    NO GROWTH 4 DAYS Performed at [facility], [place] [address] [place],  [address]    Report Status PENDING  Incomplete      Radiology Studies: DG Abd 1 View  Result Date: 04/16/2020 CLINICAL DATA:  Abdominal pain EXAM: ABDOMEN - 1 VIEW COMPARISON:  None. FINDINGS: Diffuse gaseous distention of bowel, most prominent in the small bowel. Favor ileus. No organomegaly or free air. No suspicious calcification. IMPRESSION:  Diffuse gaseous distention of bowel, favor ileus. Electronically Signed   By: Rolm Baptise M.D.   On: 04/16/2020 17:44   CT ABDOMEN PELVIS W CONTRAST  Result Date: 04/16/2020 CLINICAL DATA:  Abdominal distension, short of breath, history of left upper lobe lung cancer EXAM: CT ABDOMEN AND PELVIS WITH CONTRAST TECHNIQUE: Multidetector CT imaging of the abdomen and pelvis was performed using the standard protocol following bolus administration of intravenous contrast. CONTRAST:  166mL OMNIPAQUE IOHEXOL 300 MG/ML  SOLN COMPARISON:  04/16/2020, 02/06/2020 FINDINGS: Lower chest: Bullous emphysematous changes are again noted. There is a new cavitating left lower lobe pulmonary nodule, measuring 1.9 x 1.0 cm image 19/4, consistent with new metastatic disease. Nonspecific subpleural nodule within the left lower lobe is also new since prior study, measuring approximately 0.9 x 0.5 cm. Hepatobiliary: Small hepatic hypodensities consistent with cysts, stable. No biliary dilation. The gallbladder is unremarkable. Pancreas: Unremarkable. No pancreatic ductal dilatation or surrounding inflammatory changes. Spleen: Stable atrophic spleen. Adrenals/Urinary Tract: There are stable bilateral renal cortical cysts. No urinary tract calculi or obstructive uropathy.  The adrenals and bladder are unremarkable. Stomach/Bowel: There is marked dilation of the proximal small bowel, measuring up to 3.9 cm in diameter, with multiple gas fluid levels. Transition within the mid to distal jejunum within the right mid abdomen consistent with small-bowel obstruction. There is no bowel wall thickening. Normal appendix right lower quadrant. Colon is decompressed, with scattered diverticulosis. Vascular/Lymphatic: Aortic atherosclerosis. No enlarged abdominal or pelvic lymph nodes. Reproductive: Prostate is unremarkable. Other: Trace free fluid within the lower abdomen and pelvis. No free intraperitoneal gas. No abdominal wall hernia.  Musculoskeletal: There are no acute or destructive bony lesions. Significant bilateral hip osteoarthritis. Reconstructed images demonstrate no additional findings. IMPRESSION: 1. High-grade small bowel obstruction, with transition in the mid to distal jejunum in the right mid abdomen. 2. New left lower lobe pulmonary nodules, the larger of which demonstrates central cavitation. Findings are concerning for progressive metastatic lung cancer. These nodules are new since prior PET-CT 02/06/2020. 3. Trace free fluid. 4.  Aortic Atherosclerosis (ICD10-I70.0). Electronically Signed   By: Randa Ngo M.D.   On: 04/16/2020 22:28      Scheduled Meds: . amLODipine  10 mg Oral Daily  . vitamin C  500 mg Oral Daily  . aspirin EC  81 mg Oral Daily  . atorvastatin  20 mg Oral Daily  . enoxaparin (LOVENOX) injection  40 mg Subcutaneous Q24H  . irbesartan  300 mg Oral Daily  . predniSONE  40 mg Oral Daily  . zinc sulfate  220 mg Oral Daily   Continuous Infusions: . sodium chloride Stopped (04/11/20 0430)     LOS: 5 days    Time spent: Total of 42 minutes spent with pt, greater than 50% of which was spent in discussion of  treatment, counseling and coordination of care    Chipper Oman, MD  How to contact the [facility] Attending or Consulting provider [place] or covering provider during after hours [place], for this patient?  1. Check the care team in [facility] and look for a) attending/consulting [facility] provider listed and b) the [facility] team listed 2. Log into [email or web address] and use 's universal password to access. If you do not have the password, please contact the hospital operator. 3. Locate the [facility] provider you are looking for under [facility] and page to a number that you can be directly reached. 4. If you still have difficulty reaching the provider, please page the [facility] (Director on Call) for the Hospitalists listed on amion for assistance.

## 2020-04-17 NOTE — Plan of Care (Signed)
  Problem: Education: Goal: Knowledge of General Education information will improve Description: Including pain rating scale, medication(s)/side effects and non-pharmacologic comfort measures Outcome: Progressing   Problem: Health Behavior/Discharge Planning: Goal: Ability to manage health-related needs will improve Outcome: Progressing   Problem: Clinical Measurements: Goal: Ability to maintain clinical measurements within normal limits will improve Outcome: Progressing Goal: Will remain free from infection Outcome: Progressing Goal: Diagnostic test results will improve Outcome: Progressing Goal: Respiratory complications will improve Outcome: Progressing Goal: Cardiovascular complication will be avoided Outcome: Progressing   Problem: Activity: Goal: Risk for activity intolerance will decrease Outcome: Progressing   Problem: Nutrition: Goal: Adequate nutrition will be maintained Outcome: Progressing   Problem: Coping: Goal: Level of anxiety will decrease Outcome: Progressing   Problem: Elimination: Goal: Will not experience complications related to urinary retention Outcome: Progressing   Problem: Pain Managment: Goal: General experience of comfort will improve Outcome: Progressing   Problem: Safety: Goal: Ability to remain free from injury will improve Outcome: Progressing   Problem: Activity: Goal: Ability to tolerate increased activity will improve Outcome: Progressing   Problem: Clinical Measurements: Goal: Ability to maintain a body temperature in the normal range will improve Outcome: Progressing   Problem: Respiratory: Goal: Ability to maintain adequate ventilation will improve Outcome: Progressing Goal: Ability to maintain a clear airway will improve Outcome: Progressing   Problem: Education: Goal: Knowledge of risk factors and measures for prevention of condition will improve Outcome: Progressing   Problem: Coping: Goal: Psychosocial and  spiritual needs will be supported Outcome: Progressing   Problem: Respiratory: Goal: Complications related to the disease process, condition or treatment will be avoided or minimized Outcome: Progressing

## 2020-04-17 NOTE — Plan of Care (Signed)
  Problem: Education: Goal: Knowledge of General Education information will improve Description: Including pain rating scale, medication(s)/side effects and non-pharmacologic comfort measures Outcome: Progressing   Problem: Health Behavior/Discharge Planning: Goal: Ability to manage health-related needs will improve Outcome: Progressing   Problem: Clinical Measurements: Goal: Ability to maintain clinical measurements within normal limits will improve Outcome: Progressing Goal: Will remain free from infection Outcome: Progressing Goal: Diagnostic test results will improve Outcome: Progressing Goal: Respiratory complications will improve Outcome: Progressing Goal: Cardiovascular complication will be avoided Outcome: Progressing   Problem: Activity: Goal: Risk for activity intolerance will decrease Outcome: Progressing   Problem: Coping: Goal: Level of anxiety will decrease Outcome: Progressing   Problem: Elimination: Goal: Will not experience complications related to urinary retention Outcome: Progressing   Problem: Pain Managment: Goal: General experience of comfort will improve Outcome: Progressing   Problem: Safety: Goal: Ability to remain free from injury will improve Outcome: Progressing   Problem: Activity: Goal: Ability to tolerate increased activity will improve Outcome: Progressing   Problem: Clinical Measurements: Goal: Ability to maintain a body temperature in the normal range will improve Outcome: Progressing   Problem: Respiratory: Goal: Ability to maintain adequate ventilation will improve Outcome: Progressing Goal: Ability to maintain a clear airway will improve Outcome: Progressing   Problem: Education: Goal: Knowledge of risk factors and measures for prevention of condition will improve Outcome: Progressing   Problem: Coping: Goal: Psychosocial and spiritual needs will be supported Outcome: Progressing   Problem: Respiratory: Goal:  Complications related to the disease process, condition or treatment will be avoided or minimized Outcome: Progressing

## 2020-04-18 ENCOUNTER — Inpatient Hospital Stay (HOSPITAL_COMMUNITY): Payer: Medicare HMO

## 2020-04-18 DIAGNOSIS — A419 Sepsis, unspecified organism: Secondary | ICD-10-CM | POA: Diagnosis not present

## 2020-04-18 DIAGNOSIS — I10 Essential (primary) hypertension: Secondary | ICD-10-CM | POA: Diagnosis not present

## 2020-04-18 DIAGNOSIS — J441 Chronic obstructive pulmonary disease with (acute) exacerbation: Secondary | ICD-10-CM | POA: Diagnosis not present

## 2020-04-18 DIAGNOSIS — U071 COVID-19: Secondary | ICD-10-CM | POA: Diagnosis not present

## 2020-04-18 LAB — CBC WITH DIFFERENTIAL/PLATELET
Abs Immature Granulocytes: 0.56 10*3/uL — ABNORMAL HIGH (ref 0.00–0.07)
Basophils Absolute: 0.1 10*3/uL (ref 0.0–0.1)
Basophils Relative: 0 %
Eosinophils Absolute: 0 10*3/uL (ref 0.0–0.5)
Eosinophils Relative: 0 %
HCT: 33.2 % — ABNORMAL LOW (ref 39.0–52.0)
Hemoglobin: 11.1 g/dL — ABNORMAL LOW (ref 13.0–17.0)
Immature Granulocytes: 3 %
Lymphocytes Relative: 3 %
Lymphs Abs: 0.6 10*3/uL — ABNORMAL LOW (ref 0.7–4.0)
MCH: 29.6 pg (ref 26.0–34.0)
MCHC: 33.4 g/dL (ref 30.0–36.0)
MCV: 88.5 fL (ref 80.0–100.0)
Monocytes Absolute: 1.6 10*3/uL — ABNORMAL HIGH (ref 0.1–1.0)
Monocytes Relative: 7 %
Neutro Abs: 18.9 10*3/uL — ABNORMAL HIGH (ref 1.7–7.7)
Neutrophils Relative %: 87 %
Platelets: 453 10*3/uL — ABNORMAL HIGH (ref 150–400)
RBC: 3.75 MIL/uL — ABNORMAL LOW (ref 4.22–5.81)
RDW: 14 % (ref 11.5–15.5)
WBC: 21.7 10*3/uL — ABNORMAL HIGH (ref 4.0–10.5)
nRBC: 0 % (ref 0.0–0.2)

## 2020-04-18 LAB — COMPREHENSIVE METABOLIC PANEL
ALT: 76 U/L — ABNORMAL HIGH (ref 0–44)
AST: 56 U/L — ABNORMAL HIGH (ref 15–41)
Albumin: 2.2 g/dL — ABNORMAL LOW (ref 3.5–5.0)
Alkaline Phosphatase: 64 U/L (ref 38–126)
Anion gap: 8 (ref 5–15)
BUN: 24 mg/dL — ABNORMAL HIGH (ref 8–23)
CO2: 27 mmol/L (ref 22–32)
Calcium: 8.6 mg/dL — ABNORMAL LOW (ref 8.9–10.3)
Chloride: 96 mmol/L — ABNORMAL LOW (ref 98–111)
Creatinine, Ser: 0.58 mg/dL — ABNORMAL LOW (ref 0.61–1.24)
GFR, Estimated: >60 mL/min (ref >60)
Glucose, Bld: 118 mg/dL — ABNORMAL HIGH (ref 70–99)
Potassium: 4 mmol/L (ref 3.5–5.1)
Sodium: 131 mmol/L — ABNORMAL LOW (ref 135–145)
Total Bilirubin: 0.7 mg/dL (ref 0.3–1.2)
Total Protein: 6.2 g/dL — ABNORMAL LOW (ref 6.5–8.1)

## 2020-04-18 LAB — CULTURE, BLOOD (ROUTINE X 2)
Culture: NO GROWTH
Culture: NO GROWTH
Special Requests: ADEQUATE
Special Requests: ADEQUATE

## 2020-04-18 MED ORDER — MORPHINE SULFATE (PF) 2 MG/ML IV SOLN
2.0000 mg | Freq: Once | INTRAVENOUS | Status: AC
  Administered 2020-04-18: 2 mg via INTRAVENOUS
  Filled 2020-04-18: qty 1

## 2020-04-18 MED ORDER — PHENOL 1.4 % MT LIQD
1.0000 | OROMUCOSAL | Status: DC | PRN
  Filled 2020-04-18 (×2): qty 177

## 2020-04-18 MED ORDER — MORPHINE SULFATE (PF) 2 MG/ML IV SOLN
1.0000 mg | INTRAVENOUS | Status: DC | PRN
  Administered 2020-04-18 – 2020-04-19 (×2): 1 mg via INTRAVENOUS
  Filled 2020-04-18 (×2): qty 1

## 2020-04-18 NOTE — Progress Notes (Signed)
PROGRESS NOTE [facility]   Terry Page.   SEG:[id number] DOB: 11/30/1955  DOA: 04/10/2020 PCP: Terry Macadam, MD   Brief Narrative:  Terry Page.  is a 64 year old male with past medical history significant for COPD, spontaneous pneumothorax (2019 and 2021), SIADH, essential hypertension, hyperlipidemia, prostate cancer, left lung cancer currently on radiation therapy presented to [facility] long ED with complaints of shortness of breath. In the ED, patient was found to be hypoxic, Covid-19 PCR found to be positive.  Imaging studies notable for extensive patchy infiltrates right upper lobe concerning for pneumonia with small right pleural effusion.  Patient was started on azithromycin and ceftriaxone.  Hospitalist service consulted for further evaluation and management of acute hypoxic respiratory failure secondary to Covid-19 pneumonia versus community-acquired pneumonia.  Subjective: No acute issues or events overnight, patient reports flatus but no bowel movement, denies abdominal pain  Assessment & Plan:   Principal Problem:   Sepsis ([place]) Active Problems:   COPD with acute exacerbation ([place])   COVID-19 virus infection   Primary cancer of left upper lobe of lung ([place])   Pneumonia of right upper lobe due to infectious organism   SIADH (syndrome of inappropriate ADH production) ([place])   Essential hypertension   Mixed hyperlipidemia   Acute respiratory failure with hypoxia ([name])   SBO CT abdomen and pelvis showed high-grade small bowel obstruction at the transition point.  Surgical recommendations appreciated.   NG tube placed this morning -denies bowel movement but indicates occasional flatus Continue management per surgery.   Sepsis, POA, resolved Acute hypoxic respiratory failure, POA Community-acquired pneumonia, POA Covid-19 viral pneumonia, POA Completed 5 days of remdesivir, azithromycin and ceftriaxone, currently on prednisone taper.   He is off  supplemental oxygen.  Continue duo nebs as needed.   COPD with acute exacerbation given hypoxia, resolved Patient with known history of substantial COPD with bullous emphysema; with history of 2 spontaneous pneumothorax in the past.  Currently stable, he completed prednisone taper.  Continue home medications.  Staph hominis bacteremia - Blood cultures 3/4 on 4/9 positive for Staph hominis.  TTE 04/14/2020 with LVEF 73-71%, grade 1 diastolic dysfunction and no evidence of valvular vegetations. - Infectious disease following, appreciate assistance - Repeat blood cultures 4/12 no growth x3 days - Completed abx 4/16 - Sepsis criteria resolved  SIADH --Na 118>>127>129>131 --Continue fluid restriction 1257mL/day --BMP daily  Primary cancer of left upper lobe of lung Spiculated lung mass initial identified during hospitalization in 12/2017, initially managed with radiation therapy.  Patient has unfortunately developed another area concerning for left upper lobe malignancy, confirmed via pet imaging performed on 02/06/2020. --Continue radiation therapy per oncology  History of prostate cancer Status post radiation therapy  Essential hypertension --Amlodipine 10 mg p.o. daily --Irbesartan 300 mg p.o. daily --Hydralazine 10 mg IV every 6 hours prn SBP >180 or DBP >110 --Continue aspirin and statin  Hyperlipidemia --Atorvastatin 10 mg p.o. daily   DVT prophylaxis: Lovenox Code Status: Full code Family Communication: None at bedside Disposition Plan: Pending resolution of SBO 1 to 2 days, will discharge to SNF versus home with 24/7assistance.  Consultants:   Infectious disease -signed off 4/14  General surgery  Procedures:   TTE  Antimicrobials: Anti-infectives (From admission, onward)   Start     Dose/Rate Route Frequency Ordered Stop   04/15/20 1400  ceFAZolin (ANCEF) IVPB 2g/100 mL premix        2 g 200 mL/hr over 30 Minutes Intravenous Every 8 hours 04/15/20  2637  04/17/20 0000   04/13/20 0600  vancomycin (VANCOREADY) IVPB 1250 mg/250 mL  Status:  Discontinued        1,250 mg 166.7 mL/hr over 90 Minutes Intravenous Every 12 hours 04/12/20 1347 04/15/20 0922   04/12/20 1430  vancomycin (VANCOREADY) IVPB 1500 mg/300 mL        1,500 mg 150 mL/hr over 120 Minutes Intravenous  Once 04/12/20 1342 04/12/20 1702   04/11/20 2000  cefTRIAXone (ROCEPHIN) 2 g in sodium chloride 0.9 % 100 mL IVPB  Status:  Discontinued        2 g 200 mL/hr over 30 Minutes Intravenous Every 24 hours 04/10/20 2119 04/15/20 0922   04/11/20 1000  remdesivir 100 mg in sodium chloride 0.9 % 100 mL IVPB       "Followed by" Linked Group Details   100 mg 200 mL/hr over 30 Minutes Intravenous Daily 04/10/20 2011 04/14/20 1100   04/10/20 2200  remdesivir 200 mg in sodium chloride 0.9% 250 mL IVPB       "Followed by" Linked Group Details   200 mg 580 mL/hr over 30 Minutes Intravenous Once 04/10/20 2011 04/11/20 0002   04/10/20 1915  cefTRIAXone (ROCEPHIN) 2 g in sodium chloride 0.9 % 100 mL IVPB  Status:  Discontinued        2 g 200 mL/hr over 30 Minutes Intravenous Every 24 hours 04/10/20 1909 04/10/20 2119   04/10/20 1915  azithromycin (ZITHROMAX) 500 mg in sodium chloride 0.9 % 250 mL IVPB  Status:  Discontinued        500 mg 250 mL/hr over 60 Minutes Intravenous Every 24 hours 04/10/20 1909 04/15/20 0922        Objective: Vitals:   04/17/20 0916 04/17/20 1335 04/17/20 2042 04/18/20 0632  BP: 120/87 132/82 (!) 151/82 (!) 136/95  Pulse: 100 95 86 97  Resp:  18 20 18   Temp:  98.3 F (36.8 C) 98.6 F (37 C) 97.9 F (36.6 C)  TempSrc:  Oral Oral Oral  SpO2:  96% 95% 97%  Weight:      Height:        Intake/Output Summary (Last 24 hours) at 04/18/2020 0716 Last data filed at 04/18/2020 0100 Gross per 24 hour  Intake 392.5 ml  Output 875 ml  Net -482.5 ml   Filed Weights   04/10/20 1710 04/10/20 2120  Weight: 76.2 kg 76.2 kg    Examination:  General exam:  Chronically ill appearing HEENT: AC/AT, PERRLA, OP moist and clear Respiratory system: Clear to auscultation. No wheezes,crackle or rhonchi Cardiovascular system: S1 & S2 heard, RRR. No JVD, murmurs, rubs or gallops Gastrointestinal system: Abdomen is distended, soft mild tenderness on epigastric area.  No organomegaly or masses felt.  Distant bowel sounds noted. Central nervous system: Alert and oriented. No focal neurological deficits. Extremities: No pedal edema. Symmetric, strength 5/5   Skin: No rashes, lesions or ulcers Psychiatry: Judgement and insight appear normal. Mood & affect appropriate.    Data Reviewed: I have personally reviewed following labs and imaging studies  CBC: Recent Labs  Lab 04/12/20 0324 04/13/20 0629 04/14/20 0341 04/15/20 0336 04/16/20 0328 04/17/20 0322 04/18/20 0336  WBC 36.5* 32.9* 30.5* 24.1* 23.5* 25.6* 21.7*  NEUTROABS 33.4* 30.3* 27.7* 20.9*  --   --  18.9*  HGB 11.1* 11.5* 11.7* 11.9* 12.6* 11.2* 11.1*  HCT 32.8* 34.1* 34.8* 34.4* 37.4* 33.3* 33.2*  MCV 88.4 87.4 88.3 86.9 88.2 88.6 88.5  PLT 529* 460* 558* 589* 594* 497*  606*   Basic Metabolic Panel: Recent Labs  Lab 04/12/20 0324 04/13/20 0629 04/14/20 0341 04/15/20 0336 04/16/20 0328 04/17/20 0322 04/18/20 0336  NA 124* 125* 127* 127* 129* 129* 131*  K 3.4* 3.6 3.8 3.7 3.9 3.8 4.0  CL 88* 90* 92* 93* 93* 94* 96*  CO2 24 25 25 25 28 26 27   GLUCOSE 173* 184* 159* 154* 160* 109* 118*  BUN 27* 27* 27* 27* 27* 26* 24*  CREATININE 0.76 0.67 0.75 0.59* 0.63 0.59* 0.58*  CALCIUM 8.4* 8.5* 8.6* 8.5* 8.8* 8.4* 8.6*  MG 2.3 2.3 2.3 2.4 2.3  --   --    GFR: Estimated Creatinine Clearance: 90.8 mL/min (A) (by C-G formula based on SCr of 0.58 mg/dL (L)). Liver Function Tests: Recent Labs  Lab 04/18/20 0336  AST 56*  ALT 76*  ALKPHOS 64  BILITOT 0.7  PROT 6.2*  ALBUMIN 2.2*   No results for input(s): LIPASE, AMYLASE in the last 168 hours. No results for input(s): AMMONIA in the  last 168 hours. Coagulation Profile: No results for input(s): INR, PROTIME in the last 168 hours. Cardiac Enzymes: No results for input(s): CKTOTAL, CKMB, CKMBINDEX, TROPONINI in the last 168 hours. BNP (last 3 results) No results for input(s): PROBNP in the last 8760 hours. HbA1C: No results for input(s): HGBA1C in the last 72 hours. CBG: No results for input(s): GLUCAP in the last 168 hours. Lipid Profile: No results for input(s): CHOL, HDL, LDLCALC, TRIG, CHOLHDL, LDLDIRECT in the last 72 hours. Thyroid Function Tests: No results for input(s): TSH, T4TOTAL, FREET4, T3FREE, THYROIDAB in the last 72 hours. Anemia Panel: No results for input(s): VITAMINB12, FOLATE, FERRITIN, TIBC, IRON, RETICCTPCT in the last 72 hours. Sepsis Labs: No results for input(s): PROCALCITON, LATICACIDVEN in the last 168 hours.  Recent Results (from the past 240 hour(s))  Resp Panel by RT-PCR (Flu A&B, Covid) Nasopharyngeal Swab     Status: Abnormal   Collection Time: 04/10/20  5:35 PM   Specimen: Nasopharyngeal Swab; Nasopharyngeal(NP) swabs in vial transport medium  Result Value Ref Range Status   SARS Coronavirus 2 by RT PCR POSITIVE (A) NEGATIVE Final    Comment: RESULT CALLED TO, READ BACK BY AND VERIFIED WITH: JOHN FRICKUY RN04/09/22 @1916  BY P.HENDERSON (NOTE) SARS-CoV-2 target nucleic acids are DETECTED.  The SARS-CoV-2 RNA is generally detectable in upper respiratory specimens during the acute phase of infection. Positive results are indicative of the presence of the identified virus, but do not rule out bacterial infection or co-infection with other pathogens not detected by the test. Clinical correlation with patient history and other diagnostic information is necessary to determine patient infection status. The expected result is Negative.  Fact Sheet for Patients: EntrepreneurPulse.com.au  Fact Sheet for Healthcare  Providers: IncredibleEmployment.be  This test is not yet approved or cleared by the [place] FDA and  has been authorized for detection and/or diagnosis of SARS-CoV-2 by FDA under an Emergency Use Authorization (EUA).  This EUA will remain in effect (meaning this  test can be used) for the duration of  the COVID-19 declaration under Section 564(b)(1) of the Act, 21 U.S.C. section 360bbb-3(b)(1), unless the authorization is terminated or revoked sooner.     Influenza A by PCR NEGATIVE NEGATIVE Final   Influenza B by PCR NEGATIVE NEGATIVE Final    Comment: (NOTE) The Xpert Xpress SARS-CoV-2/FLU/RSV plus assay is intended as an aid in the diagnosis of influenza from Nasopharyngeal swab specimens and should not be used as a sole  basis for treatment. Nasal washings and aspirates are unacceptable for Xpert Xpress SARS-CoV-2/FLU/RSV testing.  Fact Sheet for Patients: EntrepreneurPulse.com.au  Fact Sheet for Healthcare Providers: IncredibleEmployment.be  This test is not yet approved or cleared by the [place] FDA and has been authorized for detection and/or diagnosis of SARS-CoV-2 by FDA under an Emergency Use Authorization (EUA). This EUA will remain in effect (meaning this test can be used) for the duration of the COVID-19 declaration under Section 564(b)(1) of the Act, 21 U.S.C. section 360bbb-3(b)(1), unless the authorization is terminated or revoked.  Performed at [facility], [place] [address] [place], [place] [address]   Urine culture     Status: Abnormal   Collection Time: 04/10/20  8:51 PM   Specimen: In/Out Cath Urine  Result Value Ref Range Status   Specimen Description   Final    IN/OUT CATH URINE Performed at [facility] [address], [place], [place] [address]    Special Requests   Final    NONE Performed at [facility], [place] [address] [place], [place] [address]    Culture (A)  Final    <10,000 COLONIES/mL INSIGNIFICANT GROWTH Performed at [facility] [address], [place], [place] [address]    Report Status 04/12/2020 FINAL  Final  Culture, blood (single)     Status: Abnormal   Collection Time: 04/10/20  8:53 PM   Specimen: BLOOD  Result Value Ref Range Status   Specimen Description   Final    BLOOD LEFT ANTECUBITAL Performed at [facility] [address], [place], [place] [address]    Special Requests   Final    BOTTLES DRAWN AEROBIC AND ANAEROBIC Blood Culture adequate volume Performed at [facility] [address] [place], [place] [address]    Culture  Setup Time   Final    GRAM POSITIVE COCCI IN BOTH AEROBIC AND ANAEROBIC BOTTLES CRITICAL RESULT CALLED TO, READ BACK BY AND VERIFIED WITH: PHARMD ELLEN JACKSON 04/11/2020 @ 2249 A.HUGHES Performed at [facility], [place] [address], [place], [place] [address]    Culture STAPHYLOCOCCUS HOMINIS (A)  Final   Report Status 04/13/2020 FINAL  Final   Organism ID, Bacteria STAPHYLOCOCCUS HOMINIS  Final      Susceptibility   Staphylococcus hominis - MIC*    CIPROFLOXACIN <=0.5 SENSITIVE Sensitive     ERYTHROMYCIN <=0.25 SENSITIVE Sensitive     GENTAMICIN <=0.5 SENSITIVE Sensitive     OXACILLIN <=0.25 SENSITIVE Sensitive     TETRACYCLINE <=1 SENSITIVE Sensitive     VANCOMYCIN <=0.5 SENSITIVE Sensitive     TRIMETH/SULFA <=10 SENSITIVE Sensitive     CLINDAMYCIN <=0.25 SENSITIVE Sensitive     RIFAMPIN <=0.5 SENSITIVE Sensitive     Inducible Clindamycin NEGATIVE Sensitive     * STAPHYLOCOCCUS HOMINIS  Blood Culture ID Panel (Reflexed)     Status: Abnormal   Collection Time: 04/10/20  8:53 PM  Result Value Ref Range Status   Enterococcus faecalis NOT DETECTED NOT DETECTED Final   Enterococcus Faecium NOT DETECTED NOT DETECTED Final   Listeria monocytogenes NOT DETECTED NOT DETECTED Final   Staphylococcus  species DETECTED (A) NOT DETECTED Final    Comment: CRITICAL RESULT CALLED TO, READ BACK BY AND VERIFIED WITH: PHARMD ELLEN JACKSON 04/11/2020 @ 2249 A.HUGHES    Staphylococcus aureus (BCID) NOT DETECTED NOT DETECTED Final   Staphylococcus epidermidis NOT DETECTED NOT DETECTED Final   Staphylococcus lugdunensis NOT DETECTED  NOT DETECTED Final   Streptococcus species NOT DETECTED NOT DETECTED Final   Streptococcus agalactiae NOT DETECTED NOT DETECTED Final   Streptococcus pneumoniae NOT DETECTED NOT DETECTED Final   Streptococcus pyogenes NOT DETECTED NOT DETECTED Final   A.calcoaceticus-baumannii NOT DETECTED NOT DETECTED Final   Bacteroides fragilis NOT DETECTED NOT DETECTED Final   Enterobacterales NOT DETECTED NOT DETECTED Final   Enterobacter cloacae complex NOT DETECTED NOT DETECTED Final   Escherichia coli NOT DETECTED NOT DETECTED Final   Klebsiella aerogenes NOT DETECTED NOT DETECTED Final   Klebsiella oxytoca NOT DETECTED NOT DETECTED Final   Klebsiella pneumoniae NOT DETECTED NOT DETECTED Final   Proteus species NOT DETECTED NOT DETECTED Final   Salmonella species NOT DETECTED NOT DETECTED Final   Serratia marcescens NOT DETECTED NOT DETECTED Final   Haemophilus influenzae NOT DETECTED NOT DETECTED Final   Neisseria meningitidis NOT DETECTED NOT DETECTED Final   Pseudomonas aeruginosa NOT DETECTED NOT DETECTED Final   Stenotrophomonas maltophilia NOT DETECTED NOT DETECTED Final   Candida albicans NOT DETECTED NOT DETECTED Final   Candida auris NOT DETECTED NOT DETECTED Final   Candida glabrata NOT DETECTED NOT DETECTED Final   Candida krusei NOT DETECTED NOT DETECTED Final   Candida parapsilosis NOT DETECTED NOT DETECTED Final   Candida tropicalis NOT DETECTED NOT DETECTED Final   Cryptococcus neoformans/gattii NOT DETECTED NOT DETECTED Final    Comment: Performed at [facility], [address], [place], [place] [address]  Blood Culture (routine x 2)     Status:  Abnormal   Collection Time: 04/10/20  8:55 PM   Specimen: BLOOD  Result Value Ref Range Status   Specimen Description   Final    BLOOD RIGHT ANTECUBITAL Performed at [facility] [address], [place], [place] [address]    Special Requests   Final    BOTTLES DRAWN AEROBIC AND ANAEROBIC Blood Culture adequate volume Performed at [facility] [address], [place], [place] [address]    Culture  Setup Time   Final    GRAM POSITIVE COCCI AEROBIC BOTTLE ONLY CRITICAL RESULT CALLED TO, READ BACK BY AND VERIFIED WITH: PHARMD Dolly Rias 04/11/2020 @2249  A.HUGHES Performed at [facility], [place] [address], [place], [place] [address]    Culture STAPHYLOCOCCUS HOMINIS (A)  Final   Report Status 04/15/2020 FINAL  Final   Organism ID, Bacteria STAPHYLOCOCCUS HOMINIS  Final      Susceptibility   Staphylococcus hominis - MIC*    CIPROFLOXACIN <=0.5 SENSITIVE Sensitive     ERYTHROMYCIN <=0.25 SENSITIVE Sensitive     GENTAMICIN <=0.5 SENSITIVE Sensitive     OXACILLIN <=0.25 SENSITIVE Sensitive     TETRACYCLINE 2 SENSITIVE Sensitive     VANCOMYCIN 1 SENSITIVE Sensitive     TRIMETH/SULFA <=10 SENSITIVE Sensitive     CLINDAMYCIN <=0.25 SENSITIVE Sensitive     RIFAMPIN <=0.5 SENSITIVE Sensitive     Inducible Clindamycin NEGATIVE Sensitive     * STAPHYLOCOCCUS HOMINIS  Blood Culture (routine x 2)     Status: None   Collection Time: 04/10/20  9:53 PM   Specimen: BLOOD  Result Value Ref Range Status   Specimen Description   Final    BLOOD BLOOD RIGHT HAND Performed at [facility] [address], [place], [place] [address]    Special Requests   Final    BOTTLES DRAWN AEROBIC ONLY Blood Culture adequate volume Performed at [facility]  [place], [place] [address]    Culture   Final    NO GROWTH 5 DAYS Performed at [facility], [place] [address], [place], [place] [address]    Report  Status 04/16/2020 FINAL  Final  Culture, blood (Routine X 2) w Reflex to ID Panel     Status: None (Preliminary result)   Collection Time: 04/13/20  6:35 AM   Specimen: BLOOD  Result Value Ref Range Status   Specimen Description   Final    BLOOD RIGHT ANTECUBITAL Performed at [facility] [address] [place], [place] [address]    Special Requests   Final    BOTTLES DRAWN AEROBIC AND ANAEROBIC Blood Culture adequate volume Performed at [facility] [address], [place], [place] [address]    Culture   Final    NO GROWTH 4 DAYS Performed at [facility], [place] [address], [place], [place] [address]    Report Status PENDING  Incomplete  Culture, blood (Routine X 2) w Reflex to ID Panel     Status: None (Preliminary result)   Collection Time: 04/13/20  6:35 AM   Specimen: BLOOD RIGHT HAND  Result Value Ref Range Status   Specimen Description   Final    BLOOD RIGHT HAND Performed at [facility] [address] [place], [place] [address]    Special Requests   Final    BOTTLES DRAWN AEROBIC ONLY Blood Culture adequate volume Performed at [facility] [address] [place], [place] [address]    Culture   Final    NO GROWTH 4 DAYS Performed at [facility], [place] [address], [place], [place] [address]    Report Status PENDING  Incomplete      Radiology Studies: DG Abd 1 View  Result Date: 04/16/2020 CLINICAL DATA:  Abdominal pain EXAM: ABDOMEN - 1 VIEW COMPARISON:  None. FINDINGS: Diffuse gaseous distention of bowel, most prominent in the small bowel. Favor ileus. No organomegaly or free air. No suspicious calcification. IMPRESSION: Diffuse gaseous distention of bowel, favor ileus. Electronically Signed   By: Rolm Baptise M.D.   On: 04/16/2020 17:44   CT ABDOMEN PELVIS W CONTRAST  Result Date: 04/16/2020 CLINICAL DATA:  Abdominal distension, short of breath, history of left upper lobe  lung cancer EXAM: CT ABDOMEN AND PELVIS WITH CONTRAST TECHNIQUE: Multidetector CT imaging of the abdomen and pelvis was performed using the standard protocol following bolus administration of intravenous contrast. CONTRAST:  155mL OMNIPAQUE IOHEXOL 300 MG/ML  SOLN COMPARISON:  04/16/2020, 02/06/2020 FINDINGS: Lower chest: Bullous emphysematous changes are again noted. There is a new cavitating left lower lobe pulmonary nodule, measuring 1.9 x 1.0 cm image 19/4, consistent with new metastatic disease. Nonspecific subpleural nodule within the left lower lobe is also new since prior study, measuring approximately 0.9 x 0.5 cm. Hepatobiliary: Small hepatic hypodensities consistent with cysts, stable. No biliary dilation. The gallbladder is unremarkable. Pancreas: Unremarkable. No pancreatic ductal dilatation or surrounding inflammatory changes. Spleen: Stable atrophic spleen. Adrenals/Urinary Tract: There are stable bilateral renal cortical cysts. No urinary tract calculi or obstructive uropathy. The adrenals and bladder are unremarkable. Stomach/Bowel: There is marked dilation of the proximal small bowel, measuring up to 3.9 cm in diameter, with multiple gas fluid levels. Transition within the mid to distal jejunum within the right mid abdomen consistent with small-bowel obstruction. There is no bowel wall thickening. Normal appendix right lower quadrant. Colon is decompressed, with scattered  diverticulosis. Vascular/Lymphatic: Aortic atherosclerosis. No enlarged abdominal or pelvic lymph nodes. Reproductive: Prostate is unremarkable. Other: Trace free fluid within the lower abdomen and pelvis. No free intraperitoneal gas. No abdominal wall hernia. Musculoskeletal: There are no acute or destructive bony lesions. Significant bilateral hip osteoarthritis. Reconstructed images demonstrate no additional findings. IMPRESSION: 1. High-grade small bowel obstruction, with transition in the mid to distal jejunum in the right  mid abdomen. 2. New left lower lobe pulmonary nodules, the larger of which demonstrates central cavitation. Findings are concerning for progressive metastatic lung cancer. These nodules are new since prior PET-CT 02/06/2020. 3. Trace free fluid. 4.  Aortic Atherosclerosis (ICD10-I70.0). Electronically Signed   By: Randa Ngo M.D.   On: 04/16/2020 22:28      Scheduled Meds: . amLODipine  10 mg Oral Daily  . vitamin C  500 mg Oral Daily  . aspirin EC  81 mg Oral Daily  . atorvastatin  20 mg Oral Daily  . enoxaparin (LOVENOX) injection  40 mg Subcutaneous Q24H  . irbesartan  300 mg Oral Daily  . predniSONE  40 mg Oral Daily  . zinc sulfate  220 mg Oral Daily   Continuous Infusions: . sodium chloride Stopped (04/11/20 0430)     LOS: 6 days    Time spent: Total of 42 minutes spent with pt, greater than 50% of which was spent in discussion of  treatment, counseling and coordination of care    Little Ishikawa, DO  How to contact the [facility] Attending or Consulting provider [place] or covering provider during after hours [place], for this patient?  1. Check the care team in [facility] and look for a) attending/consulting [facility] provider listed and b) the [facility] team listed 2. Log into [email or web address] and use [place]'s universal password to access. If you do not have the password, please contact the hospital operator. 3. Locate the [facility] provider you are looking for under [facility] and page to a number that you can be directly reached. 4. If you still have difficulty reaching the provider, please page the [facility] (Director on Call) for the Hospitalists listed on amion for assistance.

## 2020-04-18 NOTE — Evaluation (Signed)
Physical Therapy Evaluation Patient Details Name: Terry Page. MRN: [id number] DOB: [birth year] Today's Date: 04/18/2020   History of Present Illness  65 yo male presents with complaints of shortness of breath. Pt tested positive for COVID 04/10/20.   on 4/15 pt developed nausea and vomiting and abdominal distention.  A CT scan was ordered.  This shows a small bowel obstruction with transition point in the distal small bowel  QMV:HQIO with both emphysema, spontaneous pneumothorax (2019 and 2021), SIADH, HTN, hyperlipidemia, prostate cancer and left lung cancer (currently receiving radiation therapy).  Clinical Impression  Pt not feeling well today. With encouragement and education regarding importance of mobility with SBO, pt agreeable.  Fatigues quickly; HR up to 122 EOB, max 135 with very short distance amb in room. Continue to recommend SNF post acute     Follow Up Recommendations SNF    Equipment Recommendations  Rolling walker with 5" wheels    Recommendations for Other Services       Precautions / Restrictions Precautions Precautions: Fall Precaution Comments: monitor O2 and HR, NGT Restrictions Weight Bearing Restrictions: No      Mobility  Bed Mobility Overal bed mobility: Needs Assistance Bed Mobility: Supine to Sit;Sit to Supine     Supine to sit: Supervision;HOB elevated Sit to supine: Supervision   General bed mobility comments: increased time, multimodal cues  to initiate, effortful    Transfers Overall transfer level: Needs assistance Equipment used: Rolling walker (2 wheeled) Transfers: Sit to/from Stand Sit to Stand: Min assist         General transfer comment: assist to rise and transition to RW, cues for hand placement and to power up with LEs  Ambulation/Gait Ambulation/Gait assistance: Min guard;Min assist Gait Distance (Feet): 22 Feet Assistive device: Rolling walker (2 wheeled) Gait Pattern/deviations: Step-through pattern;Decreased  stride length;Wide base of support     General Gait Details: mildly unsteady initially, improved with distance; HR max 135  Stairs            Wheelchair Mobility    Modified Rankin (Stroke Patients Only)       Balance                                             Pertinent Vitals/Pain Pain Assessment: Faces Faces Pain Scale: Hurts little more Pain Location: abd Pain Descriptors / Indicators: Discomfort Pain Intervention(s): Limited activity within patient's tolerance;Monitored during session    Home Living                        Prior Function                 Hand Dominance        Extremity/Trunk Assessment                Communication      Cognition Arousal/Alertness: Awake/alert Behavior During Therapy: Flat affect Overall Cognitive Status: Within Functional Limits for tasks assessed                                 General Comments: pt did not want to mobilize today however was agreeabel with encouragement      General Comments      Exercises General Exercises - Lower Extremity Ankle Circles/Pumps: AROM;Both;5 reps Heel  Slides: AROM;Both;5 reps   Assessment/Plan    PT Assessment    PT Problem List         PT Treatment Interventions      PT Goals (Current goals can be found in the Care Plan section)  Acute Rehab PT Goals Patient Stated Goal: To be able to breath and mobilize. To increase activity tolerance. PT Goal Formulation: With patient Time For Goal Achievement: 04/26/20 Potential to Achieve Goals: Good    Frequency Min 3X/week   Barriers to discharge        Co-evaluation               AM-PAC PT "6 Clicks" Mobility  Outcome Measure Help needed turning from your back to your side while in a flat bed without using bedrails?: None Help needed moving from lying on your back to sitting on the side of a flat bed without using bedrails?: A Little Help needed moving to and  from a bed to a chair (including a wheelchair)?: A Little Help needed standing up from a chair using your arms (e.g., wheelchair or bedside chair)?: A Little Help needed to walk in hospital room?: A Little Help needed climbing 3-5 steps with a railing? : A Lot 6 Click Score: 18    End of Session   Activity Tolerance: Patient limited by fatigue;Other (comment) (incr HR) Patient left: in bed;with call bell/phone within reach;with bed alarm set   PT Visit Diagnosis: Other abnormalities of gait and mobility (R26.89);Unsteadiness on feet (R26.81)    Time: 0931-1216 PT Time Calculation (min) (ACUTE ONLY): 18 min   Charges:     PT Treatments $Gait Training: 8-22 mins        Baxter Flattery, PT  Acute Rehab Dept ([place]) [phone] Pager [phone]  04/18/2020   [facility] 04/18/2020, 3:51 PM

## 2020-04-18 NOTE — Progress Notes (Signed)
Sepsis (Terry Page)  Subjective: Pt feels about the same.  Never had NG placed despite order that was placed yesterday am  Objective: Vital signs in last 24 hours: Temp:  [97.9 F (36.6 C)-98.6 F (37 C)] 97.9 F (36.6 C) (04/17 0175) Pulse Rate:  [86-100] 97 (04/17 0632) Resp:  [18-20] 18 (04/17 1025) BP: (120-151)/(82-95) 136/95 (04/17 8527) SpO2:  [95 %-97 %] 97 % (04/17 0632) Last BM Date: 04/17/20  Intake/Output from previous day: 04/16 0701 - 04/17 0700 In: 392.5 [I.V.:392.5] Out: 782 [Urine:875] Intake/Output this shift: No intake/output data recorded.  General appearance: alert and cooperative GI: normal findings: soft, distended  Lab Results:  Results for orders placed or performed during the hospital encounter of 04/10/20 (from the past 24 hour(s))  Comprehensive metabolic panel     Status: Abnormal   Collection Time: 04/18/20  3:36 AM  Result Value Ref Range   Sodium 131 (L) 135 - 145 mmol/L   Potassium 4.0 3.5 - 5.1 mmol/L   Chloride 96 (L) 98 - 111 mmol/L   CO2 27 22 - 32 mmol/L   Glucose, Bld 118 (H) 70 - 99 mg/dL   BUN 24 (H) 8 - 23 mg/dL   Creatinine, Ser 0.58 (L) 0.61 - 1.24 mg/dL   Calcium 8.6 (L) 8.9 - 10.3 mg/dL   Total Protein 6.2 (L) 6.5 - 8.1 g/dL   Albumin 2.2 (L) 3.5 - 5.0 g/dL   AST 56 (H) 15 - 41 U/L   ALT 76 (H) 0 - 44 U/L   Alkaline Phosphatase 64 38 - 126 U/L   Total Bilirubin 0.7 0.3 - 1.2 mg/dL   GFR, Estimated >60 >60 mL/min   Anion gap 8 5 - 15  CBC with Differential/Platelet     Status: Abnormal   Collection Time: 04/18/20  3:36 AM  Result Value Ref Range   WBC 21.7 (H) 4.0 - 10.5 K/uL   RBC 3.75 (L) 4.22 - 5.81 MIL/uL   Hemoglobin 11.1 (L) 13.0 - 17.0 g/dL   HCT 33.2 (L) 39.0 - 52.0 %   MCV 88.5 80.0 - 100.0 fL   MCH 29.6 26.0 - 34.0 pg   MCHC 33.4 30.0 - 36.0 g/dL   RDW 14.0 11.5 - 15.5 %   Platelets 453 (H) 150 - 400 K/uL   nRBC 0.0 0.0 - 0.2 %   Neutrophils Relative % 87 %   Neutro Abs 18.9 (H) 1.7 - 7.7 K/uL   Lymphocytes  Relative 3 %   Lymphs Abs 0.6 (L) 0.7 - 4.0 K/uL   Monocytes Relative 7 %   Monocytes Absolute 1.6 (H) 0.1 - 1.0 K/uL   Eosinophils Relative 0 %   Eosinophils Absolute 0.0 0.0 - 0.5 K/uL   Basophils Relative 0 %   Basophils Absolute 0.1 0.0 - 0.1 K/uL   Immature Granulocytes 3 %   Abs Immature Granulocytes 0.56 (H) 0.00 - 0.07 K/uL     Studies/Results Radiology     MEDS, Scheduled . amLODipine  10 mg Oral Daily  . vitamin C  500 mg Oral Daily  . aspirin EC  81 mg Oral Daily  . atorvastatin  20 mg Oral Daily  . enoxaparin (LOVENOX) injection  40 mg Subcutaneous Q24H  . irbesartan  300 mg Oral Daily  . predniSONE  40 mg Oral Daily  . zinc sulfate  220 mg Oral Daily     Assessment: Sepsis ([name]) SBO  Plan: Pt has not received appropriate care.  Discussed with bedside  RN.  Terry Page will be placed this am  LOS: 6 days    Rosario Adie, MD [facility], [place]    04/18/2020 7:59 AM

## 2020-04-18 NOTE — Plan of Care (Signed)
  Problem: Education: Goal: Knowledge of General Education information will improve Description: Including pain rating scale, medication(s)/side effects and non-pharmacologic comfort measures Outcome: Progressing   Problem: Health Behavior/Discharge Planning: Goal: Ability to manage health-related needs will improve Outcome: Progressing   Problem: Clinical Measurements: Goal: Ability to maintain clinical measurements within normal limits will improve Outcome: Progressing Goal: Will remain free from infection Outcome: Progressing Goal: Diagnostic test results will improve Outcome: Progressing Goal: Respiratory complications will improve Outcome: Progressing Goal: Cardiovascular complication will be avoided Outcome: Progressing   Problem: Activity: Goal: Risk for activity intolerance will decrease Outcome: Progressing   Problem: Nutrition: Goal: Adequate nutrition will be maintained Outcome: Progressing   Problem: Coping: Goal: Level of anxiety will decrease Outcome: Progressing   Problem: Elimination: Goal: Will not experience complications related to urinary retention Outcome: Progressing   Problem: Pain Managment: Goal: General experience of comfort will improve Outcome: Progressing   Problem: Safety: Goal: Ability to remain free from injury will improve Outcome: Progressing   Problem: Activity: Goal: Ability to tolerate increased activity will improve Outcome: Progressing   Problem: Clinical Measurements: Goal: Ability to maintain a body temperature in the normal range will improve Outcome: Progressing   Problem: Respiratory: Goal: Ability to maintain adequate ventilation will improve Outcome: Progressing Goal: Ability to maintain a clear airway will improve Outcome: Progressing   Problem: Education: Goal: Knowledge of risk factors and measures for prevention of condition will improve Outcome: Progressing   Problem: Coping: Goal: Psychosocial and  spiritual needs will be supported Outcome: Progressing   Problem: Respiratory: Goal: Complications related to the disease process, condition or treatment will be avoided or minimized Outcome: Progressing

## 2020-04-19 ENCOUNTER — Ambulatory Visit
Admission: RE | Admit: 2020-04-19 | Discharge: 2020-04-19 | Disposition: A | Discharge status: Home / Self Care | Payer: Medicare HMO | Source: Ambulatory Visit | Attending: Radiation Oncology | Admitting: Radiation Oncology

## 2020-04-19 DIAGNOSIS — C3412 Malignant neoplasm of upper lobe, left bronchus or lung: Secondary | ICD-10-CM

## 2020-04-19 DIAGNOSIS — J441 Chronic obstructive pulmonary disease with (acute) exacerbation: Secondary | ICD-10-CM | POA: Diagnosis not present

## 2020-04-19 DIAGNOSIS — U071 COVID-19: Secondary | ICD-10-CM | POA: Diagnosis not present

## 2020-04-19 DIAGNOSIS — A419 Sepsis, unspecified organism: Secondary | ICD-10-CM | POA: Diagnosis not present

## 2020-04-19 DIAGNOSIS — I10 Essential (primary) hypertension: Secondary | ICD-10-CM | POA: Diagnosis not present

## 2020-04-19 MED ORDER — DIATRIZOATE MEGLUMINE & SODIUM 66-10 % PO SOLN
90.0000 mL | Freq: Once | ORAL | Status: DC
  Filled 2020-04-19: qty 90

## 2020-04-19 NOTE — Progress Notes (Signed)
PROGRESS NOTE [facility]   Terry Page.   VZC:[id number] DOB: 1955/11/18  DOA: 04/10/2020 PCP: [name], MD   Brief Narrative:  Terry Page.  is a 65 year old male with past medical history significant for COPD, spontaneous pneumothorax (2019 and 2021), SIADH, essential hypertension, hyperlipidemia, prostate cancer, left lung cancer currently on radiation therapy presented to [facility] long ED with complaints of shortness of breath. In the ED, patient was found to be hypoxic, Covid-19 PCR found to be positive.  Imaging studies notable for extensive patchy infiltrates right upper lobe concerning for pneumonia with small right pleural effusion.  Patient was started on azithromycin and ceftriaxone.  Hospitalist service consulted for further evaluation and management of acute hypoxic respiratory failure secondary to Covid-19 pneumonia versus community-acquired pneumonia.  Subjective: No acute issues or events overnight, patient reports ongoing flatus and bowel movements overnight early this morning, denies abdominal pain  Assessment & Plan:   Principal Problem:   Sepsis ([place]) Active Problems:   COPD with acute exacerbation ([place])   COVID-19 virus infection   Primary cancer of left upper lobe of lung ([place])   Pneumonia of right upper lobe due to infectious organism   SIADH (syndrome of inappropriate ADH production) ([place])   Essential hypertension   Mixed hyperlipidemia   Acute respiratory failure with hypoxia ([name])  SBO, resolving CT abdomen and pelvis showed high-grade small bowel obstruction at the transition point.  Surgical recommendations appreciated.   NG tube removed given bowel movement and ongoing flatus Advance diet per surgery  Sepsis, POA, resolved Acute hypoxic respiratory failure, POA Community-acquired pneumonia, POA Covid-19 viral pneumonia, POA Completed 5 days of remdesivir, azithromycin and ceftriaxone, currently on prednisone taper.   He is  off supplemental oxygen.  Continue duo nebs as needed.   COPD with acute exacerbation given hypoxia, resolved Patient with known history of substantial COPD with bullous emphysema; with history of 2 spontaneous pneumothorax in the past.  Currently stable, he completed prednisone taper.  Continue home medications.  Staph hominis bacteremia - Blood cultures 3/4 on 4/9 positive for Staph hominis.  TTE 04/14/2020 with LVEF 12-87%, grade 1 diastolic dysfunction and no evidence of valvular vegetations. - Infectious disease following, appreciate assistance - Repeat blood cultures 4/12 no growth x3 days - Completed abx 4/16 - Sepsis criteria resolved  SIADH --Na 118>>127>129>131 --Continue fluid restriction 1266mL/day --BMP daily  Primary cancer of left upper lobe of lung Spiculated lung mass initial identified during hospitalization in 12/2017, initially managed with radiation therapy.  Patient has unfortunately developed another area concerning for left upper lobe malignancy, confirmed via pet imaging performed on 02/06/2020. --Continue radiation therapy per oncology  History of prostate cancer Status post radiation therapy  Essential hypertension --Amlodipine 10 mg p.o. daily --Irbesartan 300 mg p.o. daily --Hydralazine 10 mg IV every 6 hours prn SBP >180 or DBP >110 --Continue aspirin and statin  Hyperlipidemia --Atorvastatin 10 mg p.o. daily   DVT prophylaxis: Lovenox Code Status: Full code Family Communication: None at bedside Disposition Plan: SNF in the next 24 to 48 hours pending clinical course  Consultants:   Infectious disease -signed off 4/14  General surgery  Procedures:   TTE  Antimicrobials: Anti-infectives (From admission, onward)   Start     Dose/Rate Route Frequency Ordered Stop   04/15/20 1400  ceFAZolin (ANCEF) IVPB 2g/100 mL premix        2 g 200 mL/hr over 30 Minutes Intravenous Every 8 hours 04/15/20 0922 04/17/20 0000  04/13/20 0600   vancomycin (VANCOREADY) IVPB 1250 mg/250 mL  Status:  Discontinued        1,250 mg 166.7 mL/hr over 90 Minutes Intravenous Every 12 hours 04/12/20 1347 04/15/20 0922   04/12/20 1430  vancomycin (VANCOREADY) IVPB 1500 mg/300 mL        1,500 mg 150 mL/hr over 120 Minutes Intravenous  Once 04/12/20 1342 04/12/20 1702   04/11/20 2000  cefTRIAXone (ROCEPHIN) 2 g in sodium chloride 0.9 % 100 mL IVPB  Status:  Discontinued        2 g 200 mL/hr over 30 Minutes Intravenous Every 24 hours 04/10/20 2119 04/15/20 0922   04/11/20 1000  remdesivir 100 mg in sodium chloride 0.9 % 100 mL IVPB       "Followed by" Linked Group Details   100 mg 200 mL/hr over 30 Minutes Intravenous Daily 04/10/20 2011 04/14/20 1100   04/10/20 2200  remdesivir 200 mg in sodium chloride 0.9% 250 mL IVPB       "Followed by" Linked Group Details   200 mg 580 mL/hr over 30 Minutes Intravenous Once 04/10/20 2011 04/11/20 0002   04/10/20 1915  cefTRIAXone (ROCEPHIN) 2 g in sodium chloride 0.9 % 100 mL IVPB  Status:  Discontinued        2 g 200 mL/hr over 30 Minutes Intravenous Every 24 hours 04/10/20 1909 04/10/20 2119   04/10/20 1915  azithromycin (ZITHROMAX) 500 mg in sodium chloride 0.9 % 250 mL IVPB  Status:  Discontinued        500 mg 250 mL/hr over 60 Minutes Intravenous Every 24 hours 04/10/20 1909 04/15/20 0922        Objective: Vitals:   04/18/20 1135 04/18/20 2058 04/19/20 0422 04/19/20 0424  BP: (!) 142/88 137/87 (!) 148/106 (!) 153/92  Pulse: (!) 104 (!) 110 (!) 107 (!) 103  Resp: 18 20 18    Temp: 97.7 F (36.5 C) 98.5 F (36.9 C) 98.8 F (37.1 C)   TempSrc: Oral     SpO2: 97% 95% 96%   Weight:      Height:        Intake/Output Summary (Last 24 hours) at 04/19/2020 0723 Last data filed at 04/19/2020 0500 Gross per 24 hour  Intake 0 ml  Output 800 ml  Net -800 ml   Filed Weights   04/10/20 1710 04/10/20 2120  Weight: 76.2 kg 76.2 kg    Examination:  General exam: Chronically ill  appearing HEENT: AC/AT, PERRLA, OP moist and clear Respiratory system: Clear to auscultation. No wheezes,crackle or rhonchi Cardiovascular system: S1 & S2 heard, RRR. No JVD, murmurs, rubs or gallops Gastrointestinal system: Abdomen is distended, soft mild tenderness on epigastric area.  No organomegaly or masses felt.  Distant bowel sounds noted. Central nervous system: Alert and oriented. No focal neurological deficits. Extremities: No pedal edema. Symmetric, strength 5/5   Skin: No rashes, lesions or ulcers Psychiatry: Judgement and insight appear normal. Mood & affect appropriate.    Data Reviewed: I have personally reviewed following labs and imaging studies  CBC: Recent Labs  Lab 04/13/20 0629 04/14/20 0341 04/15/20 0336 04/16/20 0328 04/17/20 0322 04/18/20 0336  WBC 32.9* 30.5* 24.1* 23.5* 25.6* 21.7*  NEUTROABS 30.3* 27.7* 20.9*  --   --  18.9*  HGB 11.5* 11.7* 11.9* 12.6* 11.2* 11.1*  HCT 34.1* 34.8* 34.4* 37.4* 33.3* 33.2*  MCV 87.4 88.3 86.9 88.2 88.6 88.5  PLT 460* 558* 589* 594* 497* 056*   Basic Metabolic Panel: Recent Labs  Lab 04/13/20 0629 04/14/20 0341 04/15/20 0336 04/16/20 0328 04/17/20 0322 04/18/20 0336  NA 125* 127* 127* 129* 129* 131*  K 3.6 3.8 3.7 3.9 3.8 4.0  CL 90* 92* 93* 93* 94* 96*  CO2 25 25 25 28 26 27   GLUCOSE 184* 159* 154* 160* 109* 118*  BUN 27* 27* 27* 27* 26* 24*  CREATININE 0.67 0.75 0.59* 0.63 0.59* 0.58*  CALCIUM 8.5* 8.6* 8.5* 8.8* 8.4* 8.6*  MG 2.3 2.3 2.4 2.3  --   --    GFR: Estimated Creatinine Clearance: 90.8 mL/min (A) (by C-G formula based on SCr of 0.58 mg/dL (L)). Liver Function Tests: Recent Labs  Lab 04/18/20 0336  AST 56*  ALT 76*  ALKPHOS 64  BILITOT 0.7  PROT 6.2*  ALBUMIN 2.2*   No results for input(s): LIPASE, AMYLASE in the last 168 hours. No results for input(s): AMMONIA in the last 168 hours. Coagulation Profile: No results for input(s): INR, PROTIME in the last 168 hours. Cardiac  Enzymes: No results for input(s): CKTOTAL, CKMB, CKMBINDEX, TROPONINI in the last 168 hours. BNP (last 3 results) No results for input(s): PROBNP in the last 8760 hours. HbA1C: No results for input(s): HGBA1C in the last 72 hours. CBG: No results for input(s): GLUCAP in the last 168 hours. Lipid Profile: No results for input(s): CHOL, HDL, LDLCALC, TRIG, CHOLHDL, LDLDIRECT in the last 72 hours. Thyroid Function Tests: No results for input(s): TSH, T4TOTAL, FREET4, T3FREE, THYROIDAB in the last 72 hours. Anemia Panel: No results for input(s): VITAMINB12, FOLATE, FERRITIN, TIBC, IRON, RETICCTPCT in the last 72 hours. Sepsis Labs: No results for input(s): PROCALCITON, LATICACIDVEN in the last 168 hours.  Recent Results (from the past 240 hour(s))  Resp Panel by RT-PCR (Flu A&B, Covid) Nasopharyngeal Swab     Status: Abnormal   Collection Time: 04/10/20  5:35 PM   Specimen: Nasopharyngeal Swab; Nasopharyngeal(NP) swabs in vial transport medium  Result Value Ref Range Status   SARS Coronavirus 2 by RT PCR POSITIVE (A) NEGATIVE Final    Comment: RESULT CALLED TO, READ BACK BY AND VERIFIED WITH: JOHN FRICKUY RN04/09/22 @1916  BY P.HENDERSON (NOTE) SARS-CoV-2 target nucleic acids are DETECTED.  The SARS-CoV-2 RNA is generally detectable in upper respiratory specimens during the acute phase of infection. Positive results are indicative of the presence of the identified virus, but do not rule out bacterial infection or co-infection with other pathogens not detected by the test. Clinical correlation with patient history and other diagnostic information is necessary to determine patient infection status. The expected result is Negative.  Fact Sheet for Patients: EntrepreneurPulse.com.au  Fact Sheet for Healthcare Providers: IncredibleEmployment.be  This test is not yet approved or cleared by the [place] FDA and  has been authorized for detection  and/or diagnosis of SARS-CoV-2 by FDA under an Emergency Use Authorization (EUA).  This EUA will remain in effect (meaning this  test can be used) for the duration of  the COVID-19 declaration under Section 564(b)(1) of the Act, 21 U.S.C. section 360bbb-3(b)(1), unless the authorization is terminated or revoked sooner.     Influenza A by PCR NEGATIVE NEGATIVE Final   Influenza B by PCR NEGATIVE NEGATIVE Final    Comment: (NOTE) The Xpert Xpress SARS-CoV-2/FLU/RSV plus assay is intended as an aid in the diagnosis of influenza from Nasopharyngeal swab specimens and should not be used as a sole basis for treatment. Nasal washings and aspirates are unacceptable for Xpert Xpress SARS-CoV-2/FLU/RSV testing.  Fact Sheet for Patients: EntrepreneurPulse.com.au  Fact Sheet for Healthcare Providers: IncredibleEmployment.be  This test is not yet approved or cleared by the [place] FDA and has been authorized for detection and/or diagnosis of SARS-CoV-2 by FDA under an Emergency Use Authorization (EUA). This EUA will remain in effect (meaning this test can be used) for the duration of the COVID-19 declaration under Section 564(b)(1) of the Act, 21 U.S.C. section 360bbb-3(b)(1), unless the authorization is terminated or revoked.  Performed at [facility], [place] [address], [place], [place] [address]   Urine culture     Status: Abnormal   Collection Time: 04/10/20  8:51 PM   Specimen: In/Out Cath Urine  Result Value Ref Range Status   Specimen Description   Final    IN/OUT CATH URINE Performed at [facility] [address] [place], [place] [address]    Special Requests   Final    NONE Performed at [facility], [place] [address] [place], [place] [address]    Culture (A)  Final    <10,000 COLONIES/mL INSIGNIFICANT GROWTH Performed at [facility] [address] [place], [place]  [address]    Report Status 04/12/2020 FINAL  Final  Culture, blood (single)     Status: Abnormal   Collection Time: 04/10/20  8:53 PM   Specimen: BLOOD  Result Value Ref Range Status   Specimen Description   Final    BLOOD LEFT ANTECUBITAL Performed at [facility] [address], [place], [place] [address]    Special Requests   Final    BOTTLES DRAWN AEROBIC AND ANAEROBIC Blood Culture adequate volume Performed at [facility] [address], [place], [place] [address]    Culture  Setup Time   Final    GRAM POSITIVE COCCI IN BOTH AEROBIC AND ANAEROBIC BOTTLES CRITICAL RESULT CALLED TO, READ BACK BY AND VERIFIED WITH: PHARMD ELLEN JACKSON 04/11/2020 @ 2249 A.HUGHES Performed at [facility], [place] [address], [place], [place] [address]    Culture STAPHYLOCOCCUS HOMINIS (A)  Final   Report Status 04/13/2020 FINAL  Final   Organism ID, Bacteria STAPHYLOCOCCUS HOMINIS  Final      Susceptibility   Staphylococcus hominis - MIC*    CIPROFLOXACIN <=0.5 SENSITIVE Sensitive     ERYTHROMYCIN <=0.25 SENSITIVE Sensitive     GENTAMICIN <=0.5 SENSITIVE Sensitive     OXACILLIN <=0.25 SENSITIVE Sensitive     TETRACYCLINE <=1 SENSITIVE Sensitive     VANCOMYCIN <=0.5 SENSITIVE Sensitive     TRIMETH/SULFA <=10 SENSITIVE Sensitive     CLINDAMYCIN <=0.25 SENSITIVE Sensitive     RIFAMPIN <=0.5 SENSITIVE Sensitive     Inducible Clindamycin NEGATIVE Sensitive     * STAPHYLOCOCCUS HOMINIS  Blood Culture ID Panel (Reflexed)     Status: Abnormal   Collection Time: 04/10/20  8:53 PM  Result Value Ref Range Status   Enterococcus faecalis NOT DETECTED NOT DETECTED Final   Enterococcus Faecium NOT DETECTED NOT DETECTED Final   Listeria monocytogenes NOT DETECTED NOT DETECTED Final   Staphylococcus species DETECTED (A) NOT DETECTED Final    Comment: CRITICAL RESULT CALLED TO, READ BACK BY AND VERIFIED WITH: PHARMD ELLEN JACKSON 04/11/2020 @ 2249 A.HUGHES     Staphylococcus aureus (BCID) NOT DETECTED NOT DETECTED Final   Staphylococcus epidermidis NOT DETECTED NOT DETECTED Final   Staphylococcus lugdunensis NOT DETECTED NOT DETECTED Final   Streptococcus species NOT DETECTED NOT DETECTED Final   Streptococcus agalactiae NOT DETECTED NOT DETECTED Final  Streptococcus pneumoniae NOT DETECTED NOT DETECTED Final   Streptococcus pyogenes NOT DETECTED NOT DETECTED Final   A.calcoaceticus-baumannii NOT DETECTED NOT DETECTED Final   Bacteroides fragilis NOT DETECTED NOT DETECTED Final   Enterobacterales NOT DETECTED NOT DETECTED Final   Enterobacter cloacae complex NOT DETECTED NOT DETECTED Final   Escherichia coli NOT DETECTED NOT DETECTED Final   Klebsiella aerogenes NOT DETECTED NOT DETECTED Final   Klebsiella oxytoca NOT DETECTED NOT DETECTED Final   Klebsiella pneumoniae NOT DETECTED NOT DETECTED Final   Proteus species NOT DETECTED NOT DETECTED Final   Salmonella species NOT DETECTED NOT DETECTED Final   Serratia marcescens NOT DETECTED NOT DETECTED Final   Haemophilus influenzae NOT DETECTED NOT DETECTED Final   Neisseria meningitidis NOT DETECTED NOT DETECTED Final   Pseudomonas aeruginosa NOT DETECTED NOT DETECTED Final   Stenotrophomonas maltophilia NOT DETECTED NOT DETECTED Final   Candida albicans NOT DETECTED NOT DETECTED Final   Candida auris NOT DETECTED NOT DETECTED Final   Candida glabrata NOT DETECTED NOT DETECTED Final   Candida krusei NOT DETECTED NOT DETECTED Final   Candida parapsilosis NOT DETECTED NOT DETECTED Final   Candida tropicalis NOT DETECTED NOT DETECTED Final   Cryptococcus neoformans/gattii NOT DETECTED NOT DETECTED Final    Comment: Performed at [facility], [address] [place], [place] [address]  Blood Culture (routine x 2)     Status: Abnormal   Collection Time: 04/10/20  8:55 PM   Specimen: BLOOD  Result Value Ref Range Status   Specimen Description   Final    BLOOD RIGHT ANTECUBITAL Performed  at [facility] [address] [place], [place] [address]    Special Requests   Final    BOTTLES DRAWN AEROBIC AND ANAEROBIC Blood Culture adequate volume Performed at [facility] [address], [place], [place] [address]    Culture  Setup Time   Final    GRAM POSITIVE COCCI AEROBIC BOTTLE ONLY CRITICAL RESULT CALLED TO, READ BACK BY AND VERIFIED WITH: PHARMD Dolly Rias 04/11/2020 @2249  A.HUGHES Performed at [facility], [place] [address] [place], [place] [address]    Culture STAPHYLOCOCCUS HOMINIS (A)  Final   Report Status 04/15/2020 FINAL  Final   Organism ID, Bacteria STAPHYLOCOCCUS HOMINIS  Final      Susceptibility   Staphylococcus hominis - MIC*    CIPROFLOXACIN <=0.5 SENSITIVE Sensitive     ERYTHROMYCIN <=0.25 SENSITIVE Sensitive     GENTAMICIN <=0.5 SENSITIVE Sensitive     OXACILLIN <=0.25 SENSITIVE Sensitive     TETRACYCLINE 2 SENSITIVE Sensitive     VANCOMYCIN 1 SENSITIVE Sensitive     TRIMETH/SULFA <=10 SENSITIVE Sensitive     CLINDAMYCIN <=0.25 SENSITIVE Sensitive     RIFAMPIN <=0.5 SENSITIVE Sensitive     Inducible Clindamycin NEGATIVE Sensitive     * STAPHYLOCOCCUS HOMINIS  Blood Culture (routine x 2)     Status: None   Collection Time: 04/10/20  9:53 PM   Specimen: BLOOD  Result Value Ref Range Status   Specimen Description   Final    BLOOD BLOOD RIGHT HAND Performed at [facility] [address] [place], [place] [address]    Special Requests   Final    BOTTLES DRAWN AEROBIC ONLY Blood Culture adequate volume Performed at [facility] [address] [place], [place] [address]    Culture   Final    NO GROWTH 5 DAYS Performed at [facility], [address], [place], [place] [address]    Report Status 04/16/2020 FINAL  Final  Culture, blood (Routine X 2) w Reflex to ID Panel     Status: None   Collection Time: 04/13/20  6:35 AM   Specimen: BLOOD  Result  Value Ref Range Status   Specimen Description   Final    BLOOD RIGHT ANTECUBITAL Performed at [facility] [address] [place], [place] [address]    Special Requests   Final    BOTTLES DRAWN AEROBIC AND ANAEROBIC Blood Culture adequate volume Performed at [facility] [address], [place], [place] [address]    Culture   Final    NO GROWTH 5 DAYS Performed at [facility], [place] [address], [place], [place] [address]    Report Status 04/18/2020 FINAL  Final  Culture, blood (Routine X 2) w Reflex to ID Panel     Status: None   Collection Time: 04/13/20  6:35 AM   Specimen: BLOOD RIGHT HAND  Result Value Ref Range Status   Specimen Description   Final    BLOOD RIGHT HAND Performed at [facility] [address] [place], [place] [address]    Special Requests   Final    BOTTLES DRAWN AEROBIC ONLY Blood Culture adequate volume Performed at [facility] [address], [place], [place] [address]    Culture   Final    NO GROWTH 5 DAYS Performed at [facility], [place] [address], [place],  [address]    Report Status 04/18/2020 FINAL  Final      Radiology Studies: DG Abd Portable 1V  Result Date: 04/18/2020 CLINICAL DATA:  NG tube placement EXAM: PORTABLE ABDOMEN - 1 VIEW COMPARISON:  April 16, 2020 FINDINGS: Nasogastric tube is identified distal tip in the proximal stomach. Bowel gas pattern is stable compared prior exam. IMPRESSION: Nasogastric tube with distal tip in the proximal stomach. Electronically Signed   By: Abelardo Diesel M.D.   On: 04/18/2020 08:52      Scheduled Meds: . amLODipine  10 mg Oral Daily  . vitamin C  500 mg Oral Daily  . aspirin EC  81 mg Oral Daily  . atorvastatin  20 mg Oral Daily  . enoxaparin (LOVENOX) injection  40 mg Subcutaneous Q24H  . irbesartan  300 mg Oral Daily  . predniSONE  40 mg Oral Daily  . zinc sulfate  220 mg Oral Daily    Continuous Infusions: . sodium chloride Stopped (04/11/20 0430)     LOS: 7 days    Time spent: Total of 42 minutes spent with pt, greater than 50% of which was spent in discussion of  treatment, counseling and coordination of care    Terry Ishikawa, DO  How to contact the [facility] Attending or Consulting provider [place] or covering provider during after hours [place], for this patient?  1. Check the care team in [facility] and look for a) attending/consulting TR[facility] provider listed and b) the [facility] team listed 2. Log into [email or web address] and use [place]'s universal password to access. If you do not have the password, please contact the hospital operator. 3. Locate the [facility] provider you are looking for under [facility] and page to a number that you can be directly reached. 4. If you still have difficulty reaching the provider, please page the [facility] (Director on Call) for the Hospitalists listed on amion for assistance.

## 2020-04-19 NOTE — Care Management Important Message (Signed)
Important Message  Patient Details IM Letter given to the Patient. Name: Terry Page. MRN: [id number] Date of Birth: 1955/02/05   Medicare Important Message Given:  Yes     Kerin Salen 04/19/2020, 1:22 PM

## 2020-04-19 NOTE — Progress Notes (Signed)
Subjective: Patient is passing flatus and had a bowel movement this morning. Says he feels less distended.   Objective: Vital signs in last 24 hours: Temp:  [97.7 F (36.5 C)-98.8 F (37.1 C)] 97.7 F (36.5 C) (04/18 0700) Pulse Rate:  [103-110] 110 (04/18 0700) Resp:  [18-20] 18 (04/18 0422) BP: (133-153)/(87-106) 133/93 (04/18 0700) SpO2:  [95 %-97 %] 96 % (04/18 0422) Last BM Date: 04/17/20  Intake/Output from previous day: 04/17 0701 - 04/18 0700 In: 0  Out: 800 [Urine:600; Emesis/NG output:200] Intake/Output this shift: No intake/output data recorded.  PE: General: resting comfortably, NAD Neuro: alert and oriented, no focal deficits HEENT: NG in place, draining clear gastric contents Resp: normal work of breathing on nasal cannula Abdomen: mildly distended but soft and nontender Extremities: warm and well-perfused  Lab Results:  Recent Labs    04/17/20 0322 04/18/20 0336  WBC 25.6* 21.7*  HGB 11.2* 11.1*  HCT 33.3* 33.2*  PLT 497* 453*   BMET Recent Labs    04/17/20 0322 04/18/20 0336  NA 129* 131*  K 3.8 4.0  CL 94* 96*  CO2 26 27  GLUCOSE 109* 118*  BUN 26* 24*  CREATININE 0.59* 0.58*  CALCIUM 8.4* 8.6*   PT/INR No results for input(s): LABPROT, INR in the last 72 hours. CMP     Component Value Date/Time   NA 131 (L) 04/18/2020 0336   K 4.0 04/18/2020 0336   CL 96 (L) 04/18/2020 0336   CO2 27 04/18/2020 0336   GLUCOSE 118 (H) 04/18/2020 0336   BUN 24 (H) 04/18/2020 0336   CREATININE 0.58 (L) 04/18/2020 0336   CALCIUM 8.6 (L) 04/18/2020 0336   PROT 6.2 (L) 04/18/2020 0336   ALBUMIN 2.2 (L) 04/18/2020 0336   AST 56 (H) 04/18/2020 0336   ALT 76 (H) 04/18/2020 0336   ALKPHOS 64 04/18/2020 0336   BILITOT 0.7 04/18/2020 0336   GFRNONAA >60 04/18/2020 0336   GFRAA >60 06/06/2019 0315   Lipase  No results found for: LIPASE     Studies/Results: DG Abd Portable 1V  Result Date: 04/18/2020 CLINICAL DATA:  NG tube placement  EXAM: PORTABLE ABDOMEN - 1 VIEW COMPARISON:  April 16, 2020 FINDINGS: Nasogastric tube is identified distal tip in the proximal stomach. Bowel gas pattern is stable compared prior exam. IMPRESSION: Nasogastric tube with distal tip in the proximal stomach. Electronically Signed   By: Abelardo Diesel M.D.   On: 04/18/2020 08:52    Anti-infectives: Anti-infectives (From admission, onward)   Start     Dose/Rate Route Frequency Ordered Stop   04/15/20 1400  ceFAZolin (ANCEF) IVPB 2g/100 mL premix        2 g 200 mL/hr over 30 Minutes Intravenous Every 8 hours 04/15/20 0922 04/17/20 0000   04/13/20 0600  vancomycin (VANCOREADY) IVPB 1250 mg/250 mL  Status:  Discontinued        1,250 mg 166.7 mL/hr over 90 Minutes Intravenous Every 12 hours 04/12/20 1347 04/15/20 0922   04/12/20 1430  vancomycin (VANCOREADY) IVPB 1500 mg/300 mL        1,500 mg 150 mL/hr over 120 Minutes Intravenous  Once 04/12/20 1342 04/12/20 1702   04/11/20 2000  cefTRIAXone (ROCEPHIN) 2 g in sodium chloride 0.9 % 100 mL IVPB  Status:  Discontinued        2 g 200 mL/hr over 30 Minutes Intravenous Every 24 hours 04/10/20 2119 04/15/20 0922   04/11/20 1000  remdesivir 100 mg in  sodium chloride 0.9 % 100 mL IVPB       "Followed by" Linked Group Details   100 mg 200 mL/hr over 30 Minutes Intravenous Daily 04/10/20 2011 04/14/20 1100   04/10/20 2200  remdesivir 200 mg in sodium chloride 0.9% 250 mL IVPB       "Followed by" Linked Group Details   200 mg 580 mL/hr over 30 Minutes Intravenous Once 04/10/20 2011 04/11/20 0002   04/10/20 1915  cefTRIAXone (ROCEPHIN) 2 g in sodium chloride 0.9 % 100 mL IVPB  Status:  Discontinued        2 g 200 mL/hr over 30 Minutes Intravenous Every 24 hours 04/10/20 1909 04/10/20 2119   04/10/20 1915  azithromycin (ZITHROMAX) 500 mg in sodium chloride 0.9 % 250 mL IVPB  Status:  Discontinued        500 mg 250 mL/hr over 60 Minutes Intravenous Every 24 hours 04/10/20 1909 04/15/20 4665        Assessment/Plan 65 yo male with COVID pneumonia, presenting with small bowel distension concerning for obstruction. Could also be septic ileus given no prior abdominal surgeries. Symptoms have improved with NG decompression and patient is now having bowel function. Remove NG tube today and allow sips of clear liquids. Surgery will continue to follow.     LOS: 7 days    Michaelle Birks, MD [facility] General, Hepatobiliary and Pancreatic Surgery 04/19/20 9:42 AM

## 2020-04-19 NOTE — Progress Notes (Signed)
Occupational Therapy Treatment Patient Details Name: Terry Page. MRN: [id number] DOB: [birth year] Today's Date: 04/19/2020    History of present illness 65 yo male presents with complaints of shortness of breath. Pt tested positive for COVID 04/10/20.   on 4/15 pt developed nausea and vomiting and abdominal distention.  A CT scan was ordered.  This shows a small bowel obstruction with transition point in the distal small bowel  PNT:IRWE with both emphysema, spontaneous pneumothorax (2019 and 2021), SIADH, HTN, hyperlipidemia, prostate cancer and left lung cancer (currently receiving radiation therapy).   OT comments  Pt with HR in 140s with ambulation to bathroom and toileting. Stood at sink for hand washing and oral care. Pt requires min assist to stand, min guard assist with RW to ambulate and stand for activity. Pt with NGT out and had BM, RN made aware.  Updated d/c disposition recommendation to SNF for rehab.   Follow Up Recommendations  SNF    Equipment Recommendations  3 in 1 bedside commode;Tub/shower bench    Recommendations for Other Services      Precautions / Restrictions Precautions Precautions: Fall Precaution Comments: monitor 02 and HR Restrictions Weight Bearing Restrictions: No       Mobility Bed Mobility Overal bed mobility: Needs Assistance Bed Mobility: Supine to Sit     Supine to sit: Supervision;HOB elevated     General bed mobility comments: assist for lines    Transfers Overall transfer level: Needs assistance Equipment used: Rolling walker (2 wheeled) Transfers: Sit to/from Stand Sit to Stand: Min assist         General transfer comment: from bed and toilet    Balance Overall balance assessment: Needs assistance   Sitting balance-Leahy Scale: Good     Standing balance support: During functional activity;Single extremity supported Standing balance-Leahy Scale: Fair Standing balance comment: statically                            ADL either performed or assessed with clinical judgement   ADL Overall ADL's : Needs assistance/impaired     Grooming: Wash/dry hands;Oral care;Standing;Min guard                   Toilet Transfer: Min Marine scientist Details (indicate cue type and reason): ambulated to bathroom with RW Toileting- Clothing Manipulation and Hygiene: Minimal assistance;Sit to/from stand Toileting - Clothing Manipulation Details (indicate cue type and reason): assisted for thoroughness after ambulation     Functional mobility during ADLs: Min guard;Rolling walker       Vision       Perception     Praxis      Cognition Arousal/Alertness: Awake/alert Behavior During Therapy: Flat affect Overall Cognitive Status: Within Functional Limits for tasks assessed                                          Exercises     Shoulder Instructions       General Comments      Pertinent Vitals/ Pain       Pain Assessment: No/denies pain  Home Living                                          Prior  Functioning/Environment              Frequency  Min 2X/week        Progress Toward Goals  OT Goals(current goals can now be found in the care plan section)  Progress towards OT goals: Progressing toward goals  Acute Rehab OT Goals Patient Stated Goal: To be able to breath and mobilize. To increase activity tolerance. OT Goal Formulation: With patient Time For Goal Achievement: 04/26/20 Potential to Achieve Goals: Good  Plan Discharge plan needs to be updated    Co-evaluation                 AM-PAC OT "6 Clicks" Daily Activity     Outcome Measure   Help from another person eating meals?: None Help from another person taking care of personal grooming?: A Little Help from another person toileting, which includes using toliet, bedpan, or urinal?: A Little Help from another person bathing (including washing,  rinsing, drying)?: A Lot Help from another person to put on and taking off regular upper body clothing?: A Little Help from another person to put on and taking off regular lower body clothing?: A Lot 6 Click Score: 17    End of Session Equipment Utilized During Treatment: Rolling walker;Gait belt  OT Visit Diagnosis: Unsteadiness on feet (R26.81);Muscle weakness (generalized) (M62.81)   Activity Tolerance Patient tolerated treatment well   Patient Left in chair;with call bell/phone within reach   Nurse Communication Other (comment) (HR in 140s)        Time: 8333-8329 OT Time Calculation (min): 32 min  Charges: OT General Charges $OT Visit: 1 Visit OT Treatments $Self Care/Home Management : 23-37 mins  Nestor Lewandowsky, OTR/L Acute Rehabilitation Services Pager: [phone] Office: [phone] Malka So 04/19/2020, 1:27 PM

## 2020-04-20 DIAGNOSIS — A419 Sepsis, unspecified organism: Secondary | ICD-10-CM | POA: Diagnosis not present

## 2020-04-20 DIAGNOSIS — K56609 Unspecified intestinal obstruction, unspecified as to partial versus complete obstruction: Secondary | ICD-10-CM

## 2020-04-20 DIAGNOSIS — U071 COVID-19: Secondary | ICD-10-CM | POA: Diagnosis not present

## 2020-04-20 DIAGNOSIS — J9601 Acute respiratory failure with hypoxia: Secondary | ICD-10-CM | POA: Diagnosis not present

## 2020-04-20 DIAGNOSIS — J441 Chronic obstructive pulmonary disease with (acute) exacerbation: Secondary | ICD-10-CM | POA: Diagnosis not present

## 2020-04-20 NOTE — Progress Notes (Signed)
Subjective: Doing well.  No complaints.  No nausea on sips yesterday with NGT out.  Continues to pass flatus.  No BM since yesterday.  ROS: See above, otherwise other systems negative  Objective: Vital signs in last 24 hours: Temp:  [97.8 F (36.6 C)-98 F (36.7 C)] 98 F (36.7 C) (04/19 0432) Pulse Rate:  [91-97] 91 (04/19 0432) Resp:  [16-18] 16 (04/19 0432) BP: (141-142)/(88-90) 142/88 (04/19 0432) SpO2:  [95 %-97 %] 95 % (04/19 0432) Last BM Date: 04/19/20  Intake/Output from previous day: 04/18 0701 - 04/19 0700 In: -  Out: 800 [Urine:800] Intake/Output this shift: No intake/output data recorded.  PE: Abd: soft, protuberant, but states this is normal, +BS, NT  Lab Results:  Recent Labs    04/18/20 0336  WBC 21.7*  HGB 11.1*  HCT 33.2*  PLT 453*   BMET Recent Labs    04/18/20 0336  NA 131*  K 4.0  CL 96*  CO2 27  GLUCOSE 118*  BUN 24*  CREATININE 0.58*  CALCIUM 8.6*   PT/INR No results for input(s): LABPROT, INR in the last 72 hours. CMP     Component Value Date/Time   NA 131 (L) 04/18/2020 0336   K 4.0 04/18/2020 0336   CL 96 (L) 04/18/2020 0336   CO2 27 04/18/2020 0336   GLUCOSE 118 (H) 04/18/2020 0336   BUN 24 (H) 04/18/2020 0336   CREATININE 0.58 (L) 04/18/2020 0336   CALCIUM 8.6 (L) 04/18/2020 0336   PROT 6.2 (L) 04/18/2020 0336   ALBUMIN 2.2 (L) 04/18/2020 0336   AST 56 (H) 04/18/2020 0336   ALT 76 (H) 04/18/2020 0336   ALKPHOS 64 04/18/2020 0336   BILITOT 0.7 04/18/2020 0336   GFRNONAA >60 04/18/2020 0336   GFRAA >60 06/06/2019 0315   Lipase  No results found for: LIPASE     Studies/Results: DG Abd Portable 1V  Result Date: 04/18/2020 CLINICAL DATA:  NG tube placement EXAM: PORTABLE ABDOMEN - 1 VIEW COMPARISON:  April 16, 2020 FINDINGS: Nasogastric tube is identified distal tip in the proximal stomach. Bowel gas pattern is stable compared prior exam. IMPRESSION: Nasogastric tube with distal tip in the proximal  stomach. Electronically Signed   By: Abelardo Diesel M.D.   On: 04/18/2020 08:52    Anti-infectives: Anti-infectives (From admission, onward)   Start     Dose/Rate Route Frequency Ordered Stop   04/15/20 1400  ceFAZolin (ANCEF) IVPB 2g/100 mL premix        2 g 200 mL/hr over 30 Minutes Intravenous Every 8 hours 04/15/20 0922 04/17/20 0000   04/13/20 0600  vancomycin (VANCOREADY) IVPB 1250 mg/250 mL  Status:  Discontinued        1,250 mg 166.7 mL/hr over 90 Minutes Intravenous Every 12 hours 04/12/20 1347 04/15/20 0922   04/12/20 1430  vancomycin (VANCOREADY) IVPB 1500 mg/300 mL        1,500 mg 150 mL/hr over 120 Minutes Intravenous  Once 04/12/20 1342 04/12/20 1702   04/11/20 2000  cefTRIAXone (ROCEPHIN) 2 g in sodium chloride 0.9 % 100 mL IVPB  Status:  Discontinued        2 g 200 mL/hr over 30 Minutes Intravenous Every 24 hours 04/10/20 2119 04/15/20 0922   04/11/20 1000  remdesivir 100 mg in sodium chloride 0.9 % 100 mL IVPB       "Followed by" Linked Group Details   100 mg 200 mL/hr over 30 Minutes Intravenous Daily 04/10/20 2011  04/14/20 1100   04/10/20 2200  remdesivir 200 mg in sodium chloride 0.9% 250 mL IVPB       "Followed by" Linked Group Details   200 mg 580 mL/hr over 30 Minutes Intravenous Once 04/10/20 2011 04/11/20 0002   04/10/20 1915  cefTRIAXone (ROCEPHIN) 2 g in sodium chloride 0.9 % 100 mL IVPB  Status:  Discontinued        2 g 200 mL/hr over 30 Minutes Intravenous Every 24 hours 04/10/20 1909 04/10/20 2119   04/10/20 1915  azithromycin (ZITHROMAX) 500 mg in sodium chloride 0.9 % 250 mL IVPB  Status:  Discontinued        500 mg 250 mL/hr over 60 Minutes Intravenous Every 24 hours 04/10/20 1909 04/15/20 0922       Assessment/Plan COVID PNA MMP  SBO  -seems to be resolving -did well on sips yesterday.  Will adv to soft diet today.  If he tolerates this with no further issues, he can DC later today vs tomorrow from our standpoint. -will continue to follow  while here  FEN - soft diet VTE - Lovenox ID - none currently, completed his 5 day course for PNA   LOS: 8 days    Henreitta Cea , [facility] Surgery 04/20/2020, 8:22 AM Please see Amion for pager number during day hours 7:00am-4:30pm or 7:00am -11:30am on weekends

## 2020-04-20 NOTE — Plan of Care (Signed)
  Problem: Education: Goal: Knowledge of General Education information will improve Description: Including pain rating scale, medication(s)/side effects and non-pharmacologic comfort measures Outcome: Progressing   Problem: Health Behavior/Discharge Planning: Goal: Ability to manage health-related needs will improve Outcome: Progressing   Problem: Clinical Measurements: Goal: Ability to maintain clinical measurements within normal limits will improve Outcome: Progressing Goal: Will remain free from infection Outcome: Progressing Goal: Diagnostic test results will improve Outcome: Progressing Goal: Respiratory complications will improve Outcome: Progressing Goal: Cardiovascular complication will be avoided Outcome: Progressing   Problem: Activity: Goal: Risk for activity intolerance will decrease Outcome: Progressing   Problem: Nutrition: Goal: Adequate nutrition will be maintained Outcome: Progressing   Problem: Coping: Goal: Level of anxiety will decrease Outcome: Progressing   Problem: Elimination: Goal: Will not experience complications related to urinary retention Outcome: Progressing   Problem: Pain Managment: Goal: General experience of comfort will improve Outcome: Progressing   Problem: Safety: Goal: Ability to remain free from injury will improve Outcome: Progressing   Problem: Activity: Goal: Ability to tolerate increased activity will improve Outcome: Progressing   Problem: Clinical Measurements: Goal: Ability to maintain a body temperature in the normal range will improve Outcome: Progressing   Problem: Respiratory: Goal: Ability to maintain adequate ventilation will improve Outcome: Progressing Goal: Ability to maintain a clear airway will improve Outcome: Progressing   Problem: Education: Goal: Knowledge of risk factors and measures for prevention of condition will improve Outcome: Progressing   Problem: Coping: Goal: Psychosocial and  spiritual needs will be supported Outcome: Progressing   Problem: Respiratory: Goal: Complications related to the disease process, condition or treatment will be avoided or minimized Outcome: Progressing

## 2020-04-20 NOTE — TOC Progression Note (Signed)
Transition of Care (TOC) - Progression Note    Patient Details  Name: Wendelin Bradt. MRN: [id number] Date of Birth: 05-Sep-1955  Transition of Care [facility]) CM/SW Contact  Ross Ludwig, [place] Phone Number: 04/20/2020, 5:17 PM  Clinical Narrative:     Patient has been faxed out for short term rehab for SNFs.  CSW awaiting bed offers and insurance authorization.        Expected Discharge Plan and Services                                                 Social Determinants of Health (SDOH) Interventions    Readmission Risk Interventions Readmission Risk Prevention Plan 10/24/2017  Post Dischage Appt Complete  Medication Screening Complete  Transportation Screening Complete  PCP follow-up Complete  Some recent data might be hidden

## 2020-04-20 NOTE — Progress Notes (Signed)
PT Cancellation Note  Patient Details Name: Terry Page. MRN: [id number] DOB: 10-09-1955   Cancelled Treatment:     patient reports that he has ambulated x 3 in room to BR, just got back into bed. Will check back another time.   Claretha Cooper 04/20/2020, 3:57 PM  [place] Pager [phone] Office [phone]

## 2020-04-20 NOTE — Progress Notes (Signed)
PROGRESS NOTE [facility]   Brylen Solmon Ice.   WCH:[id number] DOB: 06-09-1955  DOA: 04/10/2020 PCP: [name], MD   Brief Narrative:  Marcelino Campos.  is a 65 year old male with past medical history significant for COPD, spontaneous pneumothorax (2019 and 2021), SIADH, essential hypertension, hyperlipidemia, prostate cancer, left lung cancer currently on radiation therapy presented to [facility] long ED with complaints of shortness of breath. In the ED, patient was found to be hypoxic, Covid-19 PCR found to be positive.  Imaging studies notable for extensive patchy infiltrates right upper lobe concerning for pneumonia with small right pleural effusion.  Patient was started on azithromycin and ceftriaxone.  Hospitalist service consulted for further evaluation and management of acute hypoxic respiratory failure secondary to Covid-19 pneumonia versus community-acquired pneumonia.  Subjective: No acute issues or events overnight, patient reports ongoing flatus and bowel movements overnight early this morning, denies abdominal pain -he does report ongoing lumbago which appears to be chronic, improved with early ambulation and out of bed to chair.  Worse with bedbound status.  Assessment & Plan:   Principal Problem:   Sepsis ([place]) Active Problems:   COPD with acute exacerbation ([place])   COVID-19 virus infection   Primary cancer of left upper lobe of lung ([place])   Pneumonia of right upper lobe due to infectious organism   SIADH (syndrome of inappropriate ADH production) ([place])   Essential hypertension   Mixed hyperlipidemia   Acute respiratory failure with hypoxia ([name])  SBO, resolved CT abdomen and pelvis showed high-grade small bowel obstruction at the transition point.  Surgical recommendations appreciated.   NG tube removed given bowel movement and ongoing flatus Advance diet per surgery -stable for discharge from their standpoint  Sepsis, POA, resolved Acute hypoxic  respiratory failure, POA Community-acquired pneumonia, POA Covid-19 viral pneumonia, POA Completed 5 days of remdesivir, azithromycin and ceftriaxone, currently on prednisone taper.   He is off supplemental oxygen.  Continue duo nebs as needed.   COPD with acute exacerbation given hypoxia, resolved Patient with known history of substantial COPD with bullous emphysema; with history of 2 spontaneous pneumothorax in the past.  Currently stable, he completed prednisone taper.  Continue home medications.  Staph hominis bacteremia - Blood cultures 3/4 on 4/9 positive for Staph hominis.  TTE 04/14/2020 with LVEF 35-36%, grade 1 diastolic dysfunction and no evidence of valvular vegetations. - Infectious disease following, appreciate assistance - Repeat blood cultures 4/12 no growth x3 days - Completed abx 4/16 - Sepsis criteria resolved  SIADH --Na 118>>127>129>131 --Continue fluid restriction 1228mL/day --BMP daily  Primary cancer of left upper lobe of lung Spiculated lung mass initial identified during hospitalization in 12/2017, initially managed with radiation therapy.  Patient has unfortunately developed another area concerning for left upper lobe malignancy, confirmed via pet imaging performed on 02/06/2020. --Continue radiation therapy per oncology  History of prostate cancer Status post radiation therapy  Essential hypertension --Amlodipine 10 mg p.o. daily --Irbesartan 300 mg p.o. daily --Hydralazine 10 mg IV every 6 hours prn SBP >180 or DBP >110 --Continue aspirin and statin  Acute on chronic lumbago  -Somewhat exacerbated while bedbound previously given NG tube placement -Heating pad early ambulation acetaminophen as needed, improving with mobility -No deficits, no indication at this time for imaging  Hyperlipidemia --Atorvastatin 10 mg p.o. daily   DVT prophylaxis: Lovenox Code Status: Full code Family Communication: None at bedside Disposition Plan: Medically  stable for SNF, awaiting bed placement and insurance approval  Consultants:   Infectious  disease -signed off 4/14  General surgery  Procedures:   TTE  Antimicrobials: Anti-infectives (From admission, onward)   Start     Dose/Rate Route Frequency Ordered Stop   04/15/20 1400  ceFAZolin (ANCEF) IVPB 2g/100 mL premix        2 g 200 mL/hr over 30 Minutes Intravenous Every 8 hours 04/15/20 0922 04/17/20 0000   04/13/20 0600  vancomycin (VANCOREADY) IVPB 1250 mg/250 mL  Status:  Discontinued        1,250 mg 166.7 mL/hr over 90 Minutes Intravenous Every 12 hours 04/12/20 1347 04/15/20 0922   04/12/20 1430  vancomycin (VANCOREADY) IVPB 1500 mg/300 mL        1,500 mg 150 mL/hr over 120 Minutes Intravenous  Once 04/12/20 1342 04/12/20 1702   04/11/20 2000  cefTRIAXone (ROCEPHIN) 2 g in sodium chloride 0.9 % 100 mL IVPB  Status:  Discontinued        2 g 200 mL/hr over 30 Minutes Intravenous Every 24 hours 04/10/20 2119 04/15/20 0922   04/11/20 1000  remdesivir 100 mg in sodium chloride 0.9 % 100 mL IVPB       "Followed by" Linked Group Details   100 mg 200 mL/hr over 30 Minutes Intravenous Daily 04/10/20 2011 04/14/20 1100   04/10/20 2200  remdesivir 200 mg in sodium chloride 0.9% 250 mL IVPB       "Followed by" Linked Group Details   200 mg 580 mL/hr over 30 Minutes Intravenous Once 04/10/20 2011 04/11/20 0002   04/10/20 1915  cefTRIAXone (ROCEPHIN) 2 g in sodium chloride 0.9 % 100 mL IVPB  Status:  Discontinued        2 g 200 mL/hr over 30 Minutes Intravenous Every 24 hours 04/10/20 1909 04/10/20 2119   04/10/20 1915  azithromycin (ZITHROMAX) 500 mg in sodium chloride 0.9 % 250 mL IVPB  Status:  Discontinued        500 mg 250 mL/hr over 60 Minutes Intravenous Every 24 hours 04/10/20 1909 04/15/20 0922        Objective: Vitals:   04/19/20 0424 04/19/20 0700 04/19/20 2053 04/20/20 0432  BP: (!) 153/92 (!) 133/93 (!) 141/90 (!) 142/88  Pulse: (!) 103 (!) 110 97 91  Resp:   18  16  Temp:  97.7 F (36.5 C) 97.8 F (36.6 C) 98 F (36.7 C)  TempSrc:  Oral Oral Oral  SpO2:   97% 95%  Weight:      Height:        Intake/Output Summary (Last 24 hours) at 04/20/2020 0720 Last data filed at 04/20/2020 0430 Gross per 24 hour  Intake --  Output 800 ml  Net -800 ml   Filed Weights   04/10/20 1710 04/10/20 2120  Weight: 76.2 kg 76.2 kg    Examination:  General:  Pleasantly resting in bed, No acute distress. HEENT:  Normocephalic atraumatic.  Sclerae nonicteric, noninjected.  Extraocular movements intact bilaterally. Neck:  Without mass or deformity.  Trachea is midline. Lungs:  Clear to auscultate bilaterally without rhonchi, wheeze, or rales. Heart:  Regular rate and rhythm.  Without murmurs, rubs, or gallops. Abdomen:  Soft, nontender, nondistended.  Without guarding or rebound. Extremities: Without cyanosis, clubbing, edema, or obvious deformity. Vascular:  Dorsalis pedis and posterior tibial pulses palpable bilaterally. Skin:  Warm and dry, no erythema   Data Reviewed: I have personally reviewed following labs and imaging studies  CBC: Recent Labs  Lab 04/14/20 0341 04/15/20 0336 04/16/20 0328 04/17/20 0322 04/18/20 0336  WBC 30.5* 24.1* 23.5* 25.6* 21.7*  NEUTROABS 27.7* 20.9*  --   --  18.9*  HGB 11.7* 11.9* 12.6* 11.2* 11.1*  HCT 34.8* 34.4* 37.4* 33.3* 33.2*  MCV 88.3 86.9 88.2 88.6 88.5  PLT 558* 589* 594* 497* 903*   Basic Metabolic Panel: Recent Labs  Lab 04/14/20 0341 04/15/20 0336 04/16/20 0328 04/17/20 0322 04/18/20 0336  NA 127* 127* 129* 129* 131*  K 3.8 3.7 3.9 3.8 4.0  CL 92* 93* 93* 94* 96*  CO2 25 25 28 26 27   GLUCOSE 159* 154* 160* 109* 118*  BUN 27* 27* 27* 26* 24*  CREATININE 0.75 0.59* 0.63 0.59* 0.58*  CALCIUM 8.6* 8.5* 8.8* 8.4* 8.6*  MG 2.3 2.4 2.3  --   --    GFR: Estimated Creatinine Clearance: 90.8 mL/min (A) (by C-G formula based on SCr of 0.58 mg/dL (L)). Liver Function Tests: Recent Labs  Lab  04/18/20 0336  AST 56*  ALT 76*  ALKPHOS 64  BILITOT 0.7  PROT 6.2*  ALBUMIN 2.2*   No results for input(s): LIPASE, AMYLASE in the last 168 hours. No results for input(s): AMMONIA in the last 168 hours. Coagulation Profile: No results for input(s): INR, PROTIME in the last 168 hours. Cardiac Enzymes: No results for input(s): CKTOTAL, CKMB, CKMBINDEX, TROPONINI in the last 168 hours. BNP (last 3 results) No results for input(s): PROBNP in the last 8760 hours. HbA1C: No results for input(s): HGBA1C in the last 72 hours. CBG: No results for input(s): GLUCAP in the last 168 hours. Lipid Profile: No results for input(s): CHOL, HDL, LDLCALC, TRIG, CHOLHDL, LDLDIRECT in the last 72 hours. Thyroid Function Tests: No results for input(s): TSH, T4TOTAL, FREET4, T3FREE, THYROIDAB in the last 72 hours. Anemia Panel: No results for input(s): VITAMINB12, FOLATE, FERRITIN, TIBC, IRON, RETICCTPCT in the last 72 hours. Sepsis Labs: No results for input(s): PROCALCITON, LATICACIDVEN in the last 168 hours.  Recent Results (from the past 240 hour(s))  Resp Panel by RT-PCR (Flu A&B, Covid) Nasopharyngeal Swab     Status: Abnormal   Collection Time: 04/10/20  5:35 PM   Specimen: Nasopharyngeal Swab; Nasopharyngeal(NP) swabs in vial transport medium  Result Value Ref Range Status   SARS Coronavirus 2 by RT PCR POSITIVE (A) NEGATIVE Final    Comment: RESULT CALLED TO, READ BACK BY AND VERIFIED WITH: JOHN FRICKUY RN04/09/22 @1916  BY P.HENDERSON (NOTE) SARS-CoV-2 target nucleic acids are DETECTED.  The SARS-CoV-2 RNA is generally detectable in upper respiratory specimens during the acute phase of infection. Positive results are indicative of the presence of the identified virus, but do not rule out bacterial infection or co-infection with other pathogens not detected by the test. Clinical correlation with patient history and other diagnostic information is necessary to determine  patient infection status. The expected result is Negative.  Fact Sheet for Patients: EntrepreneurPulse.com.au  Fact Sheet for Healthcare Providers: IncredibleEmployment.be  This test is not yet approved or cleared by the [place] FDA and  has been authorized for detection and/or diagnosis of SARS-CoV-2 by FDA under an Emergency Use Authorization (EUA).  This EUA will remain in effect (meaning this  test can be used) for the duration of  the COVID-19 declaration under Section 564(b)(1) of the Act, 21 U.S.C. section 360bbb-3(b)(1), unless the authorization is terminated or revoked sooner.     Influenza A by PCR NEGATIVE NEGATIVE Final   Influenza B by PCR NEGATIVE NEGATIVE Final    Comment: (NOTE) The Xpert Xpress SARS-CoV-2/FLU/RSV plus assay  is intended as an aid in the diagnosis of influenza from Nasopharyngeal swab specimens and should not be used as a sole basis for treatment. Nasal washings and aspirates are unacceptable for Xpert Xpress SARS-CoV-2/FLU/RSV testing.  Fact Sheet for Patients: EntrepreneurPulse.com.au  Fact Sheet for Healthcare Providers: IncredibleEmployment.be  This test is not yet approved or cleared by the [place] FDA and has been authorized for detection and/or diagnosis of SARS-CoV-2 by FDA under an Emergency Use Authorization (EUA). This EUA will remain in effect (meaning this test can be used) for the duration of the COVID-19 declaration under Section 564(b)(1) of the Act, 21 U.S.C. section 360bbb-3(b)(1), unless the authorization is terminated or revoked.  Performed at [facility], [place] [address] [place], [place] [address]   Urine culture     Status: Abnormal   Collection Time: 04/10/20  8:51 PM   Specimen: In/Out Cath Urine  Result Value Ref Range Status   Specimen Description   Final    IN/OUT CATH URINE Performed at [facility] [address] [place], [place] [address]    Special Requests   Final    NONE Performed at [facility], [place] [address] [place], [place] [address]    Culture (A)  Final    <10,000 COLONIES/mL INSIGNIFICANT GROWTH Performed at [facility] [address] [place], [place] [address]    Report Status 04/12/2020 FINAL  Final  Culture, blood (single)     Status: Abnormal   Collection Time: 04/10/20  8:53 PM   Specimen: BLOOD  Result Value Ref Range Status   Specimen Description   Final    BLOOD LEFT ANTECUBITAL Performed at [facility] [address], [place], [place] [address]    Special Requests   Final    BOTTLES DRAWN AEROBIC AND ANAEROBIC Blood Culture adequate volume Performed at [facility] [address], [place], [place] [address]    Culture  Setup Time   Final    GRAM POSITIVE COCCI IN BOTH AEROBIC AND ANAEROBIC BOTTLES CRITICAL RESULT CALLED TO, READ BACK BY AND VERIFIED WITH: PHARMD ELLEN JACKSON 04/11/2020 @ 2249 A.HUGHES Performed at [facility], [place] [address], [place],  [address]    Culture STAPHYLOCOCCUS HOMINIS (A)  Final   Report Status 04/13/2020 FINAL  Final   Organism ID, Bacteria STAPHYLOCOCCUS HOMINIS  Final      Susceptibility   Staphylococcus hominis - MIC*    CIPROFLOXACIN <=0.5 SENSITIVE Sensitive     ERYTHROMYCIN <=0.25 SENSITIVE Sensitive     GENTAMICIN <=0.5 SENSITIVE Sensitive     OXACILLIN <=0.25 SENSITIVE Sensitive     TETRACYCLINE <=1 SENSITIVE Sensitive     VANCOMYCIN <=0.5 SENSITIVE Sensitive     TRIMETH/SULFA <=10 SENSITIVE Sensitive     CLINDAMYCIN <=0.25 SENSITIVE Sensitive     RIFAMPIN <=0.5 SENSITIVE Sensitive     Inducible Clindamycin NEGATIVE Sensitive     * STAPHYLOCOCCUS HOMINIS  Blood Culture ID Panel (Reflexed)     Status: Abnormal   Collection Time: 04/10/20  8:53 PM  Result Value Ref Range Status   Enterococcus faecalis NOT  DETECTED NOT DETECTED Final   Enterococcus Faecium NOT DETECTED NOT DETECTED Final   Listeria monocytogenes NOT DETECTED NOT DETECTED Final   Staphylococcus species DETECTED (A) NOT DETECTED Final    Comment: CRITICAL RESULT CALLED TO, READ BACK BY AND VERIFIED WITH: PHARMD ELLEN JACKSON 04/11/2020 @ 2249 A.HUGHES    Staphylococcus  aureus (BCID) NOT DETECTED NOT DETECTED Final   Staphylococcus epidermidis NOT DETECTED NOT DETECTED Final   Staphylococcus lugdunensis NOT DETECTED NOT DETECTED Final   Streptococcus species NOT DETECTED NOT DETECTED Final   Streptococcus agalactiae NOT DETECTED NOT DETECTED Final   Streptococcus pneumoniae NOT DETECTED NOT DETECTED Final   Streptococcus pyogenes NOT DETECTED NOT DETECTED Final   A.calcoaceticus-baumannii NOT DETECTED NOT DETECTED Final   Bacteroides fragilis NOT DETECTED NOT DETECTED Final   Enterobacterales NOT DETECTED NOT DETECTED Final   Enterobacter cloacae complex NOT DETECTED NOT DETECTED Final   Escherichia coli NOT DETECTED NOT DETECTED Final   Klebsiella aerogenes NOT DETECTED NOT DETECTED Final   Klebsiella oxytoca NOT DETECTED NOT DETECTED Final   Klebsiella pneumoniae NOT DETECTED NOT DETECTED Final   Proteus species NOT DETECTED NOT DETECTED Final   Salmonella species NOT DETECTED NOT DETECTED Final   Serratia marcescens NOT DETECTED NOT DETECTED Final   Haemophilus influenzae NOT DETECTED NOT DETECTED Final   Neisseria meningitidis NOT DETECTED NOT DETECTED Final   Pseudomonas aeruginosa NOT DETECTED NOT DETECTED Final   Stenotrophomonas maltophilia NOT DETECTED NOT DETECTED Final   Candida albicans NOT DETECTED NOT DETECTED Final   Candida auris NOT DETECTED NOT DETECTED Final   Candida glabrata NOT DETECTED NOT DETECTED Final   Candida krusei NOT DETECTED NOT DETECTED Final   Candida parapsilosis NOT DETECTED NOT DETECTED Final   Candida tropicalis NOT DETECTED NOT DETECTED Final   Cryptococcus neoformans/gattii NOT  DETECTED NOT DETECTED Final    Comment: Performed at [facility], [address], [place], [place] [address]  Blood Culture (routine x 2)     Status: Abnormal   Collection Time: 04/10/20  8:55 PM   Specimen: BLOOD  Result Value Ref Range Status   Specimen Description   Final    BLOOD RIGHT ANTECUBITAL Performed at [facility] [address] [place], [place] [address]    Special Requests   Final    BOTTLES DRAWN AEROBIC AND ANAEROBIC Blood Culture adequate volume Performed at [facility] [address] [place], [place] [address]    Culture  Setup Time   Final    GRAM POSITIVE COCCI AEROBIC BOTTLE ONLY CRITICAL RESULT CALLED TO, READ BACK BY AND VERIFIED WITH: PHARMD Dolly Rias 04/11/2020 @2249  A.HUGHES Performed at [facility], [place] [address], [place], [place] [address]    Culture STAPHYLOCOCCUS HOMINIS (A)  Final   Report Status 04/15/2020 FINAL  Final   Organism ID, Bacteria STAPHYLOCOCCUS HOMINIS  Final      Susceptibility   Staphylococcus hominis - MIC*    CIPROFLOXACIN <=0.5 SENSITIVE Sensitive     ERYTHROMYCIN <=0.25 SENSITIVE Sensitive     GENTAMICIN <=0.5 SENSITIVE Sensitive     OXACILLIN <=0.25 SENSITIVE Sensitive     TETRACYCLINE 2 SENSITIVE Sensitive     VANCOMYCIN 1 SENSITIVE Sensitive     TRIMETH/SULFA <=10 SENSITIVE Sensitive     CLINDAMYCIN <=0.25 SENSITIVE Sensitive     RIFAMPIN <=0.5 SENSITIVE Sensitive     Inducible Clindamycin NEGATIVE Sensitive     * STAPHYLOCOCCUS HOMINIS  Blood Culture (routine x 2)     Status: None   Collection Time: 04/10/20  9:53 PM   Specimen: BLOOD  Result Value Ref Range Status   Specimen Description   Final    BLOOD BLOOD RIGHT HAND Performed at [facility] [address] [place], [place] [address]    Special Requests  Final    BOTTLES DRAWN AEROBIC ONLY Blood Culture adequate volume Performed at [facility] [address], [place], [place] [address]    Culture   Final    NO GROWTH 5 DAYS Performed at [facility], [place] [address], [place], [place] [address]    Report Status 04/16/2020 FINAL  Final  Culture, blood (Routine X 2) w Reflex to ID Panel     Status: None   Collection Time: 04/13/20  6:35 AM   Specimen: BLOOD  Result Value Ref Range Status   Specimen Description   Final    BLOOD RIGHT ANTECUBITAL Performed at [facility] [address], [place], [place] [address]    Special Requests   Final    BOTTLES DRAWN AEROBIC AND ANAEROBIC Blood Culture adequate volume Performed at [place] [address] [place], [place] [address]    Culture   Final    NO GROWTH 5 DAYS Performed at [facility], [place] [address] [place], [place] [address]    Report Status 04/18/2020 FINAL  Final  Culture, blood (Routine X 2) w Reflex to ID Panel     Status: None   Collection Time: 04/13/20  6:35 AM   Specimen: BLOOD RIGHT HAND  Result Value Ref Range Status   Specimen Description   Final    BLOOD RIGHT HAND Performed at [facility] [address] [place], [place] [address]    Special Requests   Final    BOTTLES DRAWN AEROBIC ONLY Blood Culture adequate volume Performed at [facility] [address] [place], [place] [address]    Culture   Final    NO GROWTH 5 DAYS Performed at [facility], [place] [address], [place],  [address]    Report Status 04/18/2020 FINAL  Final      Radiology Studies: DG Abd Portable 1V  Result Date: 04/18/2020 CLINICAL DATA:  NG tube placement EXAM: PORTABLE ABDOMEN - 1 VIEW COMPARISON:  April 16, 2020 FINDINGS: Nasogastric tube is identified distal tip in the proximal stomach. Bowel gas pattern is stable compared prior exam. IMPRESSION: Nasogastric tube with distal tip in the proximal stomach. Electronically Signed   By: Abelardo Diesel M.D.   On: 04/18/2020 08:52       Scheduled Meds: . amLODipine  10 mg Oral Daily  . vitamin C  500 mg Oral Daily  . aspirin EC  81 mg Oral Daily  . atorvastatin  20 mg Oral Daily  . enoxaparin (LOVENOX) injection  40 mg Subcutaneous Q24H  . irbesartan  300 mg Oral Daily  . predniSONE  40 mg Oral Daily  . zinc sulfate  220 mg Oral Daily   Continuous Infusions: . sodium chloride Stopped (04/11/20 0430)     LOS: 8 days    Time spent: 71min   Dshaun Reppucci C Jamee Keach, DO Use epic messenger for contact  If 7 PM to 7 AM please use AMION to contact appropriate provider

## 2020-04-20 NOTE — NC FL2 (Signed)
[place] LEVEL OF CARE SCREENING TOOL     IDENTIFICATION  Patient Name: Terry Page. Birthdate: 23-Aug-1955 Sex: male Admission Date (Current Location): 04/10/2020  [facility] and [place] Number:  Herbalist and Address:  [facility],  [place], [place]      Provider Number: [id number]  Attending Physician Name and Address:  Little Ishikawa, MD  Relative Name and Phone Number:  Francetta Found Relative [phone]  [phone]    Current Level of Care: Hospital Recommended Level of Care: [place] Prior Approval Number:    Date Approved/Denied:   PASRR Number: [id number]  Discharge Plan: SNF    Current Diagnoses: Patient Active Problem List   Diagnosis Date Noted  . Acute respiratory failure with hypoxia ([place]) 04/12/2020  . COVID-19 virus infection 04/10/2020  . Primary cancer of left upper lobe of lung ([place]) 04/10/2020  . Pneumonia of right upper lobe due to infectious organism 04/10/2020  . SIADH (syndrome of inappropriate ADH production) ([place]) 04/10/2020  . Nicotine dependence 04/10/2020  . Essential hypertension 04/10/2020  . Mixed hyperlipidemia 04/10/2020  . Sepsis ([place]) 04/10/2020  . 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 Malignant neoplasm of prostate ([place]) 05/20/2019  . Hyponatremia 12/04/2017  . AKI (acute kidney injury) ([place]) 12/04/2017  . Incidental lung nodule, greater than or equal to 15mm 10/22/2017  . COPD with acute exacerbation ([place])   . Spontaneous pneumothorax 10/20/2017  . Tobacco abuse   . Alcohol abuse     Orientation RESPIRATION BLADDER Height & Weight     Self,Time,Place,Situation  Normal Incontinent Weight: 168 lb (76.2 kg) Height:  5\' 9"  (175.3 cm)  BEHAVIORAL SYMPTOMS/MOOD NEUROLOGICAL BOWEL NUTRITION STATUS      Continent Diet (Cardiac diet)  AMBULATORY STATUS COMMUNICATION OF NEEDS Skin   Limited Assist Verbally Normal                        Personal Care Assistance Level of Assistance  Bathing,Dressing,Feeding Bathing Assistance: Limited assistance Feeding assistance: Independent Dressing Assistance: Limited assistance     Functional Limitations Info  Sight,Hearing,Speech Sight Info: Adequate Hearing Info: Adequate Speech Info: Adequate    SPECIAL CARE FACTORS FREQUENCY  PT (By licensed PT),OT (By licensed OT)     PT Frequency: Minimum 5x a week OT Frequency: Minimum 5x a week            Contractures Contractures Info: Not present    Additional Factors Info  Code Status,Allergies,Isolation Precautions Code Status Info: Full Code Allergies Info: NKA     Isolation Precautions Info: COVID+ as of 04/10/20     Current Medications (04/20/2020):  This is the current hospital active medication list Current Facility-Administered Medications  Medication Dose Route Frequency Provider Last Rate Last Admin  . 0.9 %  sodium chloride infusion   Intravenous PRN Shalhoub, Sherryll Burger, MD   Stopped at 04/11/20 0430  . acetaminophen (TYLENOL) tablet 650 mg  650 mg Oral Q6H PRN Vernelle Emerald, MD   650 mg at 04/20/20 1041  . albuterol (VENTOLIN HFA) 108 (90 Base) MCG/ACT inhaler 2 puff  2 puff Inhalation Q4H PRN Shalhoub, Sherryll Burger, MD   2 puff at 04/14/20 0948  . alum & mag hydroxide-simeth (MAALOX/MYLANTA) 200-200-20 MG/5ML suspension 15 mL  15 mL Oral Q6H PRN Opyd, Ilene Qua, MD   15 mL at 04/16/20 0132  . amLODipine (NORVASC) tablet 10 mg  10 mg Oral Daily Inda Merlin  J, MD   10 mg at 04/20/20 1041  . ascorbic acid (VITAMIN C) tablet 500 mg  500 mg Oral Daily Shalhoub, Sherryll Burger, MD   500 mg at 04/20/20 1041  . aspirin EC tablet 81 mg  81 mg Oral Daily Vernelle Emerald, MD   81 mg at 04/20/20 1041  . atorvastatin (LIPITOR) tablet 20 mg  20 mg Oral Daily Shalhoub, Sherryll Burger, MD   20 mg at 04/20/20 1041  . enoxaparin (LOVENOX) injection 40 mg  40 mg Subcutaneous Q24H Shalhoub, Sherryll Burger, MD   40 mg at 04/19/20 2131   . guaiFENesin-dextromethorphan (ROBITUSSIN DM) 100-10 MG/5ML syrup 10 mL  10 mL Oral Q4H PRN Vernelle Emerald, MD   10 mL at 04/15/20 2107  . hydrALAZINE (APRESOLINE) injection 10 mg  10 mg Intravenous Q6H PRN Shalhoub, Sherryll Burger, MD      . ipratropium-albuterol (DUONEB) 0.5-2.5 (3) MG/3ML nebulizer solution 3 mL  3 mL Nebulization Q4H PRN [place] Enma Maeda J, DO      . irbesartan (AVAPRO) tablet 300 mg  300 mg Oral Daily Shalhoub, Sherryll Burger, MD   300 mg at 04/20/20 1041  . morphine 2 MG/ML injection 1 mg  1 mg Intravenous Q4H PRN Little Ishikawa, MD   1 mg at 04/19/20 0349  . ondansetron (ZOFRAN) tablet 4 mg  4 mg Oral Q6H PRN Shalhoub, Sherryll Burger, MD       Or  . ondansetron [facility]) injection 4 mg  4 mg Intravenous Q6H PRN Shalhoub, Sherryll Burger, MD   4 mg at 04/16/20 0058  . phenol (CHLORASEPTIC) mouth spray 1 spray  1 spray Mouth/Throat PRN Little Ishikawa, MD      . polyethylene glycol (MIRALAX / GLYCOLAX) packet 17 g  17 g Oral Daily PRN Vernelle Emerald, MD   17 g at 04/14/20 1024  . predniSONE (DELTASONE) tablet 40 mg  40 mg Oral Daily [place] Donnamarie Poag, DO   40 mg at 04/20/20 1040  . zinc sulfate capsule 220 mg  220 mg Oral Daily Shalhoub, Sherryll Burger, MD   220 mg at 04/20/20 1041     Discharge Medications: Please see discharge summary for a list of discharge medications.  Relevant Imaging Results:  Relevant Lab Results:   Additional Information SSN [id number]  Ross Ludwig, LCSW

## 2020-04-21 DIAGNOSIS — J9601 Acute respiratory failure with hypoxia: Secondary | ICD-10-CM | POA: Diagnosis not present

## 2020-04-21 DIAGNOSIS — U071 COVID-19: Secondary | ICD-10-CM | POA: Diagnosis not present

## 2020-04-21 DIAGNOSIS — R1084 Generalized abdominal pain: Secondary | ICD-10-CM | POA: Diagnosis not present

## 2020-04-21 DIAGNOSIS — J1282 Pneumonia due to coronavirus disease 2019: Secondary | ICD-10-CM | POA: Diagnosis not present

## 2020-04-21 LAB — CBC
HCT: 29.9 % — ABNORMAL LOW (ref 39.0–52.0)
Hemoglobin: 10.2 g/dL — ABNORMAL LOW (ref 13.0–17.0)
MCH: 29.6 pg (ref 26.0–34.0)
MCHC: 34.1 g/dL (ref 30.0–36.0)
MCV: 86.7 fL (ref 80.0–100.0)
Platelets: 373 10*3/uL (ref 150–400)
RBC: 3.45 MIL/uL — ABNORMAL LOW (ref 4.22–5.81)
RDW: 14 % (ref 11.5–15.5)
WBC: 21.8 10*3/uL — ABNORMAL HIGH (ref 4.0–10.5)
nRBC: 0 % (ref 0.0–0.2)

## 2020-04-21 LAB — COMPREHENSIVE METABOLIC PANEL
ALT: 472 U/L — ABNORMAL HIGH (ref 0–44)
AST: 442 U/L — ABNORMAL HIGH (ref 15–41)
Albumin: 2.3 g/dL — ABNORMAL LOW (ref 3.5–5.0)
Alkaline Phosphatase: 106 U/L (ref 38–126)
Anion gap: 8 (ref 5–15)
BUN: 21 mg/dL (ref 8–23)
CO2: 26 mmol/L (ref 22–32)
Calcium: 8.7 mg/dL — ABNORMAL LOW (ref 8.9–10.3)
Chloride: 98 mmol/L (ref 98–111)
Creatinine, Ser: 0.49 mg/dL — ABNORMAL LOW (ref 0.61–1.24)
GFR, Estimated: >60 mL/min (ref >60)
Glucose, Bld: 130 mg/dL — ABNORMAL HIGH (ref 70–99)
Potassium: 3.5 mmol/L (ref 3.5–5.1)
Sodium: 132 mmol/L — ABNORMAL LOW (ref 135–145)
Total Bilirubin: 0.5 mg/dL (ref 0.3–1.2)
Total Protein: 6.4 g/dL — ABNORMAL LOW (ref 6.5–8.1)

## 2020-04-21 MED ORDER — POLYETHYLENE GLYCOL 3350 17 G PO PACK
17.0000 g | PACK | Freq: Every day | ORAL | Status: DC
  Administered 2020-04-21 – 2020-04-24 (×4): 17 g via ORAL
  Filled 2020-04-21 (×4): qty 1

## 2020-04-21 MED ORDER — ACETAMINOPHEN 500 MG PO TABS
500.0000 mg | ORAL_TABLET | Freq: Four times a day (QID) | ORAL | Status: DC | PRN
  Administered 2020-04-22 – 2020-04-24 (×2): 500 mg via ORAL
  Filled 2020-04-21: qty 1

## 2020-04-21 MED ORDER — SODIUM CHLORIDE 0.9 % IV SOLN: INTRAVENOUS | Status: DC

## 2020-04-21 MED ORDER — BISACODYL 10 MG RE SUPP
10.0000 mg | Freq: Once | RECTAL | Status: AC
  Administered 2020-04-21: 10 mg via RECTAL
  Filled 2020-04-21: qty 1

## 2020-04-21 MED ORDER — SIMETHICONE 40 MG/0.6ML PO SUSP
40.0000 mg | Freq: Four times a day (QID) | ORAL | Status: DC | PRN
  Administered 2020-04-21: 40 mg via ORAL
  Filled 2020-04-21 (×3): qty 0.6

## 2020-04-21 NOTE — Progress Notes (Signed)
Physical Therapy Treatment Patient Details Name: Terry Page. MRN: [id number] DOB: December 18, 1955 Today's Date: 04/21/2020    History of Present Illness 65 yo male presents with complaints of shortness of breath. Pt tested positive for COVID 04/10/20.   on 4/15 pt developed nausea and vomiting and abdominal distention.  A CT scan was ordered.  This shows a small bowel obstruction with transition point in the distal small bowel  KDT:OIZT with both emphysema, spontaneous pneumothorax (2019 and 2021), SIADH, HTN, hyperlipidemia, prostate cancer and left lung cancer (currently receiving radiation therapy).    PT Comments    Patient reports feeling better. Patient ambulated x 17' with RW. SPO2 95% on RA.  Follow Up Recommendations  SNF     Equipment Recommendations  Rolling walker with 5" wheels    Recommendations for Other Services       Precautions / Restrictions Precautions Precaution Comments: monitor 02 and HR    Mobility  Bed Mobility               General bed mobility comments: in recliner    Transfers   Equipment used: Rolling walker (2 wheeled)   Sit to Stand: Supervision         General transfer comment: extra time to rise, use of RW  Ambulation/Gait Ambulation/Gait assistance: Supervision Gait Distance (Feet): 90 Feet Assistive device: Rolling walker (2 wheeled) Gait Pattern/deviations: Step-through pattern     General Gait Details: gait improved with steadiness, still needs Rw   Stairs             Wheelchair Mobility    Modified Rankin (Stroke Patients Only)       Balance           Standing balance support: During functional activity;Single extremity supported Standing balance-Leahy Scale: Fair                              Cognition Arousal/Alertness: Awake/alert Behavior During Therapy: Flat affect Overall Cognitive Status: Within Functional Limits for tasks assessed                                         Exercises      General Comments        Pertinent Vitals/Pain Faces Pain Scale: No hurt    Home Living                      Prior Function            PT Goals (current goals can now be found in the care plan section) Progress towards PT goals: Progressing toward goals    Frequency    Min 3X/week (? home)      PT Plan Current plan remains appropriate    Co-evaluation              AM-PAC PT "6 Clicks" Mobility   Outcome Measure  Help needed turning from your back to your side while in a flat bed without using bedrails?: None Help needed moving from lying on your back to sitting on the side of a flat bed without using bedrails?: None Help needed moving to and from a bed to a chair (including a wheelchair)?: A Little Help needed standing up from a chair using your arms (e.g., wheelchair or bedside chair)?: A Little Help needed  to walk in hospital room?: A Little Help needed climbing 3-5 steps with a railing? : A Lot 6 Click Score: 19    End of Session   Activity Tolerance: Patient tolerated treatment well Patient left: in chair;with call bell/phone within reach Nurse Communication: Mobility status PT Visit Diagnosis: Other abnormalities of gait and mobility (R26.89);Unsteadiness on feet (R26.81)     Time: 6578-4696 PT Time Calculation (min) (ACUTE ONLY): 15 min  Charges:  $Gait Training: 8-22 mins                     [place] Pager [phone] Office [phone]    Claretha Cooper 04/21/2020, 1:16 PM

## 2020-04-21 NOTE — Progress Notes (Signed)
Subjective: Patient states he feels "slouchy" today and a little gassy.  Ate well yesterday, doesn't have much appetite today.  Still passing a lot of flatus.  No BM since Monday.  ROS: See above, otherwise other systems negative  Objective: Vital signs in last 24 hours: Temp:  [97.7 F (36.5 C)-98.3 F (36.8 C)] 97.8 F (36.6 C) (04/20 0857) Pulse Rate:  [82-92] 91 (04/20 0857) Resp:  [18-20] 18 (04/20 0857) BP: (116-137)/(68-85) 116/68 (04/20 0857) SpO2:  [95 %-98 %] 97 % (04/20 0857) Last BM Date: 04/20/20 (per patient)  Intake/Output from previous day: 04/19 0701 - 04/20 0700 In: 360 [P.O.:360] Out: 750 [Urine:750] Intake/Output this shift: No intake/output data recorded.  PE: Abd: soft, NT, ND, +BS  Lab Results:  Recent Labs    04/21/20 0333  WBC 21.8*  HGB 10.2*  HCT 29.9*  PLT 373   BMET Recent Labs    04/21/20 0333  NA 132*  K 3.5  CL 98  CO2 26  GLUCOSE 130*  BUN 21  CREATININE 0.49*  CALCIUM 8.7*   PT/INR No results for input(s): LABPROT, INR in the last 72 hours. CMP     Component Value Date/Time   NA 132 (L) 04/21/2020 0333   K 3.5 04/21/2020 0333   CL 98 04/21/2020 0333   CO2 26 04/21/2020 0333   GLUCOSE 130 (H) 04/21/2020 0333   BUN 21 04/21/2020 0333   CREATININE 0.49 (L) 04/21/2020 0333   CALCIUM 8.7 (L) 04/21/2020 0333   PROT 6.4 (L) 04/21/2020 0333   ALBUMIN 2.3 (L) 04/21/2020 0333   AST 442 (H) 04/21/2020 0333   ALT 472 (H) 04/21/2020 0333   ALKPHOS 106 04/21/2020 0333   BILITOT 0.5 04/21/2020 0333   GFRNONAA >60 04/21/2020 0333   GFRAA >60 06/06/2019 0315   Lipase  No results found for: LIPASE     Studies/Results: No results found.  Anti-infectives: Anti-infectives (From admission, onward)   Start     Dose/Rate Route Frequency Ordered Stop   04/15/20 1400  ceFAZolin (ANCEF) IVPB 2g/100 mL premix        2 g 200 mL/hr over 30 Minutes Intravenous Every 8 hours 04/15/20 0922 04/17/20 0000   04/13/20 0600   vancomycin (VANCOREADY) IVPB 1250 mg/250 mL  Status:  Discontinued        1,250 mg 166.7 mL/hr over 90 Minutes Intravenous Every 12 hours 04/12/20 1347 04/15/20 0922   04/12/20 1430  vancomycin (VANCOREADY) IVPB 1500 mg/300 mL        1,500 mg 150 mL/hr over 120 Minutes Intravenous  Once 04/12/20 1342 04/12/20 1702   04/11/20 2000  cefTRIAXone (ROCEPHIN) 2 g in sodium chloride 0.9 % 100 mL IVPB  Status:  Discontinued        2 g 200 mL/hr over 30 Minutes Intravenous Every 24 hours 04/10/20 2119 04/15/20 0922   04/11/20 1000  remdesivir 100 mg in sodium chloride 0.9 % 100 mL IVPB       "Followed by" Linked Group Details   100 mg 200 mL/hr over 30 Minutes Intravenous Daily 04/10/20 2011 04/14/20 1100   04/10/20 2200  remdesivir 200 mg in sodium chloride 0.9% 250 mL IVPB       "Followed by" Linked Group Details   200 mg 580 mL/hr over 30 Minutes Intravenous Once 04/10/20 2011 04/11/20 0002   04/10/20 1915  cefTRIAXone (ROCEPHIN) 2 g in sodium chloride 0.9 % 100 mL IVPB  Status:  Discontinued  2 g 200 mL/hr over 30 Minutes Intravenous Every 24 hours 04/10/20 1909 04/10/20 2119   04/10/20 1915  azithromycin (ZITHROMAX) 500 mg in sodium chloride 0.9 % 250 mL IVPB  Status:  Discontinued        500 mg 250 mL/hr over 60 Minutes Intravenous Every 24 hours 04/10/20 1909 04/15/20 0922       Assessment/Plan COVID PNA MMP  SBO  -resolving -just not having a great day but don't suspect any set back in obstructive process.   -will schedule miralax daily and a suppository today.  Simethicone for gas prn -surgically stable for DC when SNF bed available  FEN - soft diet VTE - Lovenox ID - none currently, completed his 5 day course for PNA   LOS: 9 days    Henreitta Cea , [facility] Surgery 04/21/2020, 9:09 AM Please see Amion for pager number during day hours 7:00am-4:30pm or 7:00am -11:30am on weekends

## 2020-04-21 NOTE — Progress Notes (Signed)
Terry Page.  UDJ:[id number] DOB: 1955-02-25 DOA: 04/10/2020 PCP: [name], MD    Brief Narrative:  65 year old with a history of COPD, spontaneous pneumothorax 2019 and 2021, SIADH, HTN, HLD, prostate cancer, and left lung cancer on XRT who presented to the ED with shortness of breath 04/10/20. He was found to be hypoxic and COVID-positive. CXR noted extensive patchy infiltrates throughout the right upper lobe with a small right pleural effusion.  Date of Positive COVID Test:  04/10/20  Last day of COVID isolation: 04/20/20  Vaccination Status: Unknown  COVID-19 specific Treatment: Remdesivir 4/9 > 4/13 Steroid 4/9 > 4/20  Antimicrobials:  Ceftriaxone 4/9 > 4/13 Azithromycin 4/9 > 4/13 Cefazolin 4/14 > 4/15 Vancomycin 4/11 > 4/13  DVT prophylaxis: Lovenox  Subjective: Sitting up in a bedside chair.  Reports his last bowel movement was yesterday.  Denies abdominal pain or vomiting.  Reports poor appetite today but states he was eating just fine yesterday.  Denies nausea or vomiting.  No shortness of breath or fevers.  Assessment & Plan:  SBO -resolved Noted on CT abdomen/pelvis -followed by general surgery -NG tube required transiently but now has been removed -diet advanced to regular without difficulty  Acute hypoxic respiratory failure Now resolved with patient stable on room air  COVID Pneumonia Completed 5 days of Remdesivir therapy -discontinued steroid dosing today -clinically resolved -has completed 10 full days of isolation and therefore this can be discontinued today  Bacterial pneumonia -sepsis Has completed a full course of azithromycin and ceftriaxone -no persisting symptoms of infection  Transaminitis Newly appreciated this morning -possibly related to COVID/Remdesivir or SBO or both -recheck in a.m. 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 -clinically stable at  present  Staph hominis bacteremia Blood culture positive 3 out of 4 4/9 -TTE 4/13 with no evidence of valvular vegetation -ID evaluated -repeat cultures 4/12 with no growth  -completed antibiotic therapy 4/16 -no clinical evidence of ongoing infection  Chronic SIADH Stable sodium with fluid restriction of 1200 cc/day  Primary cancer left upper lobe lung Spiculated mass identified during hospitalization 12/19 -initially managed with XRT -another area of concern has developed within the LUL and was confirmed via PET 02/06/2020 -ongoing XRT per radiation oncology  History of prostate cancer Status post XRT  HTN Blood pressure well controlled at this time  Acute on chronic lumbago Symptoms well controlled at present  HLD Hold atorvastatin in setting of transaminitis  Disposition Awaiting SNF bed for short-term rehab stay   Code Status: FULL CODE Family Communication: No family present at time of exam Status is: Inpatient  Remains inpatient appropriate because:Inpatient level of care appropriate due to severity of illness   Dispo: The patient is from: Home              Anticipated d/c is to: SNF              Patient currently is not medically stable to d/c.   Difficult to place patient No  Consultants:  General surgery ID  Objective: Blood pressure 116/68, pulse 91, temperature 97.8 F (36.6 C), temperature source Oral, resp. rate 18, height 5\' 9"  (1.753 m), weight 76.2 kg, SpO2 97 %.  Intake/Output Summary (Last 24 hours) at 04/21/2020 0943 Last data filed at 04/21/2020 0600 Gross per 24 hour  Intake 360 ml  Output 750 ml  Net -390 ml   Filed Weights   04/10/20 1710 04/10/20 2120  Weight: 76.2 kg 76.2 kg    Examination: General: No acute respiratory distress Lungs: Clear to auscultation bilaterally without wheezes or crackles Cardiovascular: Regular rate and rhythm without murmur gallop or rub normal S1 and S2 Abdomen: Nontender, mildly protuberant but baseline  per patient, soft, bowel sounds positive, no rebound, no ascites, no appreciable mass Extremities: No significant cyanosis, clubbing, or edema bilateral lower extremities  CBC: Recent Labs  Lab 04/15/20 0336 04/16/20 0328 04/17/20 0322 04/18/20 0336 04/21/20 0333  WBC 24.1*   < > 25.6* 21.7* 21.8*  NEUTROABS 20.9*  --   --  18.9*  --   HGB 11.9*   < > 11.2* 11.1* 10.2*  HCT 34.4*   < > 33.3* 33.2* 29.9*  MCV 86.9   < > 88.6 88.5 86.7  PLT 589*   < > 497* 453* 373   < > = values in this interval not displayed.   Basic Metabolic Panel: Recent Labs  Lab 04/15/20 0336 04/16/20 0328 04/17/20 0322 04/18/20 0336 04/21/20 0333  NA 127* 129* 129* 131* 132*  K 3.7 3.9 3.8 4.0 3.5  CL 93* 93* 94* 96* 98  CO2 25 28 26 27 26   GLUCOSE 154* 160* 109* 118* 130*  BUN 27* 27* 26* 24* 21  CREATININE 0.59* 0.63 0.59* 0.58* 0.49*  CALCIUM 8.5* 8.8* 8.4* 8.6* 8.7*  MG 2.4 2.3  --   --   --    GFR: Estimated Creatinine Clearance: 90.8 mL/min (A) (by C-G formula based on SCr of 0.49 mg/dL (L)).  Liver Function Tests: Recent Labs  Lab 04/18/20 0336 04/21/20 0333  AST 56* 442*  ALT 76* 472*  ALKPHOS 64 106  BILITOT 0.7 0.5  PROT 6.2* 6.4*  ALBUMIN 2.2* 2.3*     Recent Results (from the past 240 hour(s))  Culture, blood (Routine X 2) w Reflex to ID Panel     Status: None   Collection Time: 04/13/20  6:35 AM   Specimen: BLOOD  Result Value Ref Range Status   Specimen Description   Final    BLOOD RIGHT ANTECUBITAL Performed at [facility], [place] [address] [place], [place] [address]    Special Requests   Final    BOTTLES DRAWN AEROBIC AND ANAEROBIC Blood Culture adequate volume Performed at [place] [address] [place], [place] [address]    Culture   Final    NO GROWTH 5 DAYS Performed at [facility], [place] [address] [place], [place] [address]    Report Status 04/18/2020 FINAL  Final  Culture, blood (Routine X 2) w  Reflex to ID Panel     Status: None   Collection Time: 04/13/20  6:35 AM   Specimen: BLOOD RIGHT HAND  Result Value Ref Range Status   Specimen Description   Final    BLOOD RIGHT HAND Performed at [place] [address], [place], [place] [address]    Special Requests   Final    BOTTLES DRAWN AEROBIC ONLY Blood Culture adequate volume Performed at [place] [address], [place], [place] [address]    Culture   Final    NO GROWTH 5 DAYS Performed at [facility], [place] [address], [place], [place] [address]    Report Status 04/18/2020 FINAL  Final     Scheduled Meds: . amLODipine  10 mg Oral Daily  . vitamin C  500 mg Oral Daily  . aspirin EC  81 mg Oral Daily  . atorvastatin  20 mg Oral Daily  . bisacodyl  10 mg Rectal Once  . enoxaparin (LOVENOX) injection  40 mg Subcutaneous Q24H  . irbesartan  300 mg Oral Daily  . polyethylene glycol  17 g Oral Daily  . predniSONE  40 mg Oral Daily  . zinc sulfate  220 mg Oral Daily   Continuous Infusions: . sodium chloride Stopped (04/11/20 0430)     LOS: 9 days   Cherene Altes, MD [facility] Office  [phone] Pager - Text Page per Amion  If 7PM-7AM, please contact night-coverage per Amion 04/21/2020, 9:43 AM

## 2020-04-22 DIAGNOSIS — K56609 Unspecified intestinal obstruction, unspecified as to partial versus complete obstruction: Secondary | ICD-10-CM | POA: Diagnosis not present

## 2020-04-22 DIAGNOSIS — E222 Syndrome of inappropriate secretion of antidiuretic hormone: Secondary | ICD-10-CM | POA: Diagnosis not present

## 2020-04-22 DIAGNOSIS — J441 Chronic obstructive pulmonary disease with (acute) exacerbation: Secondary | ICD-10-CM | POA: Diagnosis not present

## 2020-04-22 DIAGNOSIS — U071 COVID-19: Secondary | ICD-10-CM | POA: Diagnosis not present

## 2020-04-22 LAB — CBC
HCT: 30.4 % — ABNORMAL LOW (ref 39.0–52.0)
Hemoglobin: 10.3 g/dL — ABNORMAL LOW (ref 13.0–17.0)
MCH: 29.7 pg (ref 26.0–34.0)
MCHC: 33.9 g/dL (ref 30.0–36.0)
MCV: 87.6 fL (ref 80.0–100.0)
Platelets: 378 10*3/uL (ref 150–400)
RBC: 3.47 MIL/uL — ABNORMAL LOW (ref 4.22–5.81)
RDW: 14.1 % (ref 11.5–15.5)
WBC: 23.1 10*3/uL — ABNORMAL HIGH (ref 4.0–10.5)
nRBC: 0 % (ref 0.0–0.2)

## 2020-04-22 LAB — COMPREHENSIVE METABOLIC PANEL
ALT: 343 U/L — ABNORMAL HIGH (ref 0–44)
AST: 175 U/L — ABNORMAL HIGH (ref 15–41)
Albumin: 2.2 g/dL — ABNORMAL LOW (ref 3.5–5.0)
Alkaline Phosphatase: 88 U/L (ref 38–126)
Anion gap: 9 (ref 5–15)
BUN: 14 mg/dL (ref 8–23)
CO2: 25 mmol/L (ref 22–32)
Calcium: 8.7 mg/dL — ABNORMAL LOW (ref 8.9–10.3)
Chloride: 99 mmol/L (ref 98–111)
Creatinine, Ser: 0.5 mg/dL — ABNORMAL LOW (ref 0.61–1.24)
GFR, Estimated: >60 mL/min (ref >60)
Glucose, Bld: 105 mg/dL — ABNORMAL HIGH (ref 70–99)
Potassium: 3.4 mmol/L — ABNORMAL LOW (ref 3.5–5.1)
Sodium: 133 mmol/L — ABNORMAL LOW (ref 135–145)
Total Bilirubin: 0.6 mg/dL (ref 0.3–1.2)
Total Protein: 6 g/dL — ABNORMAL LOW (ref 6.5–8.1)

## 2020-04-22 LAB — MAGNESIUM: Magnesium: 1.7 mg/dL (ref 1.7–2.4)

## 2020-04-22 LAB — PHOSPHORUS: Phosphorus: 2.7 mg/dL (ref 2.5–4.6)

## 2020-04-22 MED ORDER — MAGNESIUM SULFATE 2 GM/50ML IV SOLN
2.0000 g | Freq: Once | INTRAVENOUS | Status: AC
  Administered 2020-04-22: 2 g via INTRAVENOUS
  Filled 2020-04-22: qty 50

## 2020-04-22 MED ORDER — POTASSIUM CHLORIDE CRYS ER 20 MEQ PO TBCR
40.0000 meq | EXTENDED_RELEASE_TABLET | Freq: Three times a day (TID) | ORAL | Status: AC
  Administered 2020-04-22 (×3): 40 meq via ORAL
  Filled 2020-04-22 (×3): qty 2

## 2020-04-22 NOTE — Plan of Care (Signed)
  Problem: Education: Goal: Knowledge of General Education information will improve Description: Including pain rating scale, medication(s)/side effects and non-pharmacologic comfort measures Outcome: Progressing   Problem: Health Behavior/Discharge Planning: Goal: Ability to manage health-related needs will improve Outcome: Progressing   Problem: Clinical Measurements: Goal: Ability to maintain clinical measurements within normal limits will improve Outcome: Progressing Goal: Will remain free from infection Outcome: Progressing Goal: Diagnostic test results will improve Outcome: Progressing Goal: Respiratory complications will improve Outcome: Progressing Goal: Cardiovascular complication will be avoided Outcome: Progressing   Problem: Activity: Goal: Risk for activity intolerance will decrease Outcome: Progressing   Problem: Nutrition: Goal: Adequate nutrition will be maintained Outcome: Progressing   Problem: Coping: Goal: Level of anxiety will decrease Outcome: Progressing   Problem: Elimination: Goal: Will not experience complications related to urinary retention Outcome: Progressing   Problem: Pain Managment: Goal: General experience of comfort will improve Outcome: Progressing   Problem: Safety: Goal: Ability to remain free from injury will improve Outcome: Progressing   Problem: Activity: Goal: Ability to tolerate increased activity will improve Outcome: Progressing   Problem: Clinical Measurements: Goal: Ability to maintain a body temperature in the normal range will improve Outcome: Progressing   Problem: Respiratory: Goal: Ability to maintain adequate ventilation will improve Outcome: Progressing Goal: Ability to maintain a clear airway will improve Outcome: Progressing   Problem: Education: Goal: Knowledge of risk factors and measures for prevention of condition will improve Outcome: Progressing   Problem: Coping: Goal: Psychosocial and  spiritual needs will be supported Outcome: Progressing   Problem: Respiratory: Goal: Complications related to the disease process, condition or treatment will be avoided or minimized Outcome: Progressing

## 2020-04-22 NOTE — Progress Notes (Signed)
Occupational Therapy Treatment Patient Details Name: Terry Page. MRN: [id number] DOB: [birth year] Today's Date: 04/22/2020    History of present illness 65 yo male presents with complaints of shortness of breath. Pt tested positive for COVID 04/10/20.   on 4/15 pt developed nausea and vomiting and abdominal distention.  A CT scan was ordered.  This shows a small bowel obstruction with transition point in the distal small bowel  ENI:DPOE with both emphysema, spontaneous pneumothorax (2019 and 2021), SIADH, HTN, hyperlipidemia, prostate cancer and left lung cancer (currently receiving radiation therapy).   OT comments  This 65 yo male admitted with above presents to acute OT today making good gains towards his getting his independence back with his basic ADLs. He is at an overall min A-S level currently for basic ADLs with no SOB with ADLs this session nor with "laps' (4) in his room with RW at a S level (40 feet total).  Follow Up Recommendations  SNF;Supervision/Assistance - 24 hour    Equipment Recommendations  Other (comment) (TBD next venue)       Precautions / Restrictions Precautions Precautions: Fall Precaution Comments: O2 and HR stable Restrictions Weight Bearing Restrictions: No       Mobility Bed Mobility               General bed mobility comments: in recliner upon arrival    Transfers Overall transfer level: Needs assistance Equipment used: Rolling walker (2 wheeled) Transfers: Sit to/from Stand Sit to Stand: Supervision              Balance Overall balance assessment: Needs assistance Sitting-balance support: No upper extremity supported;Feet supported Sitting balance-Leahy Scale: Good     Standing balance support: No upper extremity supported;During functional activity Standing balance-Leahy Scale: Fair Standing balance comment: standing a sink to wash hands, hair, and comb hair                           ADL either performed or  assessed with clinical judgement   ADL Overall ADL's : Needs assistance/impaired     Grooming: Wash/dry hands;Brushing hair;Standing;Supervision/safety                   Toilet Transfer: Min guard;RW;Ambulation;Grab Corporate investment banker- Clothing Manipulation and Hygiene: Sit to/from stand;Supervision/safety Toileting - Clothing Manipulation Details (indicate cue type and reason): assisted for thoroughness after ambulation             Vision Baseline Vision/History: Wears glasses Wears Glasses: At all times Patient Visual Report: No change from baseline            Cognition Arousal/Alertness: Awake/alert Behavior During Therapy: WFL for tasks assessed/performed Overall Cognitive Status: Within Functional Limits for tasks assessed                                                     Pertinent Vitals/ Pain       Pain Assessment: No/denies pain         Frequency  Min 2X/week        Progress Toward Goals  OT Goals(current goals can now be found in the care plan section)  Progress towards OT goals: Progressing toward goals  Acute Rehab OT Goals Patient Stated Goal: to go to rehab only a short  time OT Goal Formulation: With patient Time For Goal Achievement: 04/26/20 Potential to Achieve Goals: Good  Plan Discharge plan remains appropriate       AM-PAC OT "6 Clicks" Daily Activity     Outcome Measure   Help from another person eating meals?: None Help from another person taking care of personal grooming?: A Little Help from another person toileting, which includes using toliet, bedpan, or urinal?: A Little Help from another person bathing (including washing, rinsing, drying)?: A Little Help from another person to put on and taking off regular upper body clothing?: A Little Help from another person to put on and taking off regular lower body clothing?: A Little 6 Click Score: 19    End of Session Equipment Utilized  During Treatment: Rolling walker  OT Visit Diagnosis: Unsteadiness on feet (R26.81);Muscle weakness (generalized) (M62.81)   Activity Tolerance Patient tolerated treatment well   Patient Left with call bell/phone within reach;in bed;with bed alarm set   Nurse Communication          Time: 0165-8006 OT Time Calculation (min): 22 min  Charges: OT General Charges $OT Visit: 1 Visit OT Treatments $Self Care/Home Management : 8-22 mins  [place], OTR/L Acute [employer] Pager [phone] Office [phone]      Almon Register 04/22/2020, 3:20 PM

## 2020-04-22 NOTE — TOC Progression Note (Signed)
Transition of Care (TOC) - Progression Note    Patient Details  Name: Curby Carswell. MRN: [id number] Date of Birth: Mar 22, 1955  Transition of Care [facility]) CM/SW Contact  Purcell Mouton, RN Phone Number: 04/22/2020, 1:26 PM  Clinical Narrative:     Spoke with pt concerning SNF and gave him his bed offers.  Pt accepted [facility] in [place]. Admission Coordinator was called and she will get authorization from [employer].        Expected Discharge Plan and Services                                                 Social Determinants of Health (SDOH) Interventions    Readmission Risk Interventions Readmission Risk Prevention Plan 10/24/2017  Post Dischage Appt Complete  Medication Screening Complete  Transportation Screening Complete  PCP follow-up Complete  Some recent data might be hidden

## 2020-04-22 NOTE — Progress Notes (Signed)
.       Subjective: Patient states he is feeling much better today.  Had a BM yesterday.  Eating better today.  ROS: See above, otherwise other systems negative  Objective: Vital signs in last 24 hours: Temp:  [97.5 F (36.4 C)-98.2 F (36.8 C)] 98.2 F (36.8 C) (04/21 0508) Pulse Rate:  [76-87] 76 (04/21 0508) Resp:  [9-18] 18 (04/21 0508) BP: (109-139)/(76-90) 131/82 (04/21 0508) SpO2:  [98 %-100 %] 98 % (04/21 0508) Last BM Date: 04/21/20  Intake/Output from previous day: 04/20 0701 - 04/21 0700 In: 720 [P.O.:720] Out: 200 [Urine:200] Intake/Output this shift: No intake/output data recorded.  PE: Abd: soft, NT, ND, +BS  Lab Results:  Recent Labs    04/21/20 0333 04/22/20 0355  WBC 21.8* 23.1*  HGB 10.2* 10.3*  HCT 29.9* 30.4*  PLT 373 378   BMET Recent Labs    04/21/20 0333 04/22/20 0355  NA 132* 133*  K 3.5 3.4*  CL 98 99  CO2 26 25  GLUCOSE 130* 105*  BUN 21 14  CREATININE 0.49* 0.50*  CALCIUM 8.7* 8.7*   PT/INR No results for input(s): LABPROT, INR in the last 72 hours. CMP     Component Value Date/Time   NA 133 (L) 04/22/2020 0355   K 3.4 (L) 04/22/2020 0355   CL 99 04/22/2020 0355   CO2 25 04/22/2020 0355   GLUCOSE 105 (H) 04/22/2020 0355   BUN 14 04/22/2020 0355   CREATININE 0.50 (L) 04/22/2020 0355   CALCIUM 8.7 (L) 04/22/2020 0355   PROT 6.0 (L) 04/22/2020 0355   ALBUMIN 2.2 (L) 04/22/2020 0355   AST 175 (H) 04/22/2020 0355   ALT 343 (H) 04/22/2020 0355   ALKPHOS 88 04/22/2020 0355   BILITOT 0.6 04/22/2020 0355   GFRNONAA >60 04/22/2020 0355   GFRAA >60 06/06/2019 0315   Lipase  No results found for: LIPASE     Studies/Results: No results found.  Anti-infectives: Anti-infectives (From admission, onward)   Start     Dose/Rate Route Frequency Ordered Stop   04/15/20 1400  ceFAZolin (ANCEF) IVPB 2g/100 mL premix        2 g 200 mL/hr over 30 Minutes Intravenous Every 8 hours 04/15/20 0922 04/17/20 0000   04/13/20 0600   vancomycin (VANCOREADY) IVPB 1250 mg/250 mL  Status:  Discontinued        1,250 mg 166.7 mL/hr over 90 Minutes Intravenous Every 12 hours 04/12/20 1347 04/15/20 0922   04/12/20 1430  vancomycin (VANCOREADY) IVPB 1500 mg/300 mL        1,500 mg 150 mL/hr over 120 Minutes Intravenous  Once 04/12/20 1342 04/12/20 1702   04/11/20 2000  cefTRIAXone (ROCEPHIN) 2 g in sodium chloride 0.9 % 100 mL IVPB  Status:  Discontinued        2 g 200 mL/hr over 30 Minutes Intravenous Every 24 hours 04/10/20 2119 04/15/20 0922   04/11/20 1000  remdesivir 100 mg in sodium chloride 0.9 % 100 mL IVPB       "Followed by" Linked Group Details   100 mg 200 mL/hr over 30 Minutes Intravenous Daily 04/10/20 2011 04/14/20 1100   04/10/20 2200  remdesivir 200 mg in sodium chloride 0.9% 250 mL IVPB       "Followed by" Linked Group Details   200 mg 580 mL/hr over 30 Minutes Intravenous Once 04/10/20 2011 04/11/20 0002   04/10/20 1915  cefTRIAXone (ROCEPHIN) 2 g in sodium chloride 0.9 % 100 mL IVPB  Status:  Discontinued        2 g 200 mL/hr over 30 Minutes Intravenous Every 24 hours 04/10/20 1909 04/10/20 2119   04/10/20 1915  azithromycin (ZITHROMAX) 500 mg in sodium chloride 0.9 % 250 mL IVPB  Status:  Discontinued        500 mg 250 mL/hr over 60 Minutes Intravenous Every 24 hours 04/10/20 1909 04/15/20 1017       Assessment/Plan COVID PNA MMP  SBO  -resolving -tolerating solid diet today and moved his bowels well yesterday -surgically stable, ok for DC to SNF -we will sign off  FEN - soft diet VTE - Lovenox ID - none currently, completed his 5 day course for PNA   LOS: 10 days    Henreitta Cea , [facility] Surgery 04/22/2020, 8:59 AM Please see Amion for pager number during day hours 7:00am-4:30pm or 7:00am -11:30am on weekends

## 2020-04-22 NOTE — Progress Notes (Signed)
Terry Page.  KDX:[id number] DOB: 04-Sep-1955 DOA: 04/10/2020 PCP: [name], MD    Brief Narrative:  65 year old with a history of COPD, spontaneous pneumothorax 2019 and 2021, SIADH, HTN, HLD, prostate cancer, and left lung cancer on XRT who presented to the ED with shortness of breath 04/10/20. He was found to be hypoxic and COVID-positive. CXR noted extensive patchy infiltrates throughout the right upper lobe with a small right pleural effusion.  Date of Positive COVID Test:  04/10/20  Last day of COVID isolation: 04/20/20  Vaccination Status: Unknown  COVID-19 specific Treatment: Remdesivir 4/9 > 4/13 Steroid 4/9 > 4/20  Antimicrobials:  Ceftriaxone 4/9 > 4/13 Azithromycin 4/9 > 4/13 Cefazolin 4/14 > 4/15 Vancomycin 4/11 > 4/13  DVT prophylaxis: Lovenox  Subjective: Afebrile.  Vital signs stable.  Saturation 98% on room air.  Potassium low at 3.4 and magnesium below goal of 1.7.  LFTs improving today.  Patient states he feels well and has no new complaints.  Patient has moved his bowels this morning.  Assessment & Plan:  SBO - resolved Noted on CT abdomen/pelvis -followed by general surgery -NG tube required transiently but now has been removed - diet advanced to regular without difficulty - keep potassium at 4.0 and magnesium at 2.0 as able  Acute hypoxic respiratory failure Now resolved with patient stable on room air  COVID Pneumonia Completed 5 days of Remdesivir therapy - discontinued steroid dosing 4/20 - clinically resolved - completed 10 full days of isolation as of 4/19PM  Bacterial pneumonia - sepsis Has completed a full course of azithromycin and ceftriaxone - no persisting symptoms of infection  Transaminitis Newly appreciated 4/20 - possibly related to COVID/Remdesivir or SBO or both - improving   COPD with acute exacerbation 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 - clinically stable at present  Staph  hominis bacteremia Blood culture positive 3 out of 4 4/9 -TTE 4/13 with no evidence of valvular vegetation -ID evaluated -repeat cultures 4/12 with no growth  -completed antibiotic therapy 4/16 -no clinical evidence of ongoing infection  Chronic SIADH Stable sodium with fluid restriction of 1200 cc/day  Primary cancer left upper lobe lung Spiculated mass identified during hospitalization 12/19 -initially managed with XRT -another area of concern has developed within the LUL and was confirmed via PET 02/06/2020 - ongoing car per Radiation Oncology  History of prostate cancer Status post XRT  HTN Blood pressure controlled at this time  Acute on chronic lumbago Symptoms well controlled at present  HLD Holding atorvastatin in setting of transaminitis  Disposition Awaiting SNF bed for short-term rehab stay   Code Status: FULL CODE Family Communication: No family present at time of exam Status is: Inpatient  Remains inpatient appropriate because:Inpatient level of care appropriate due to severity of illness   Dispo: The patient is from: Home              Anticipated d/c is to: SNF              Patient currently is not medically stable to d/c.   Difficult to place patient No  Consultants:  General surgery ID  Objective: Blood pressure 131/82, pulse 76, temperature 98.2 F (36.8 C), temperature source Oral, resp. rate 18, height 5\' 9"  (1.753 m), weight 76.2 kg, SpO2 98 %.  Intake/Output Summary (Last 24 hours) at 04/22/2020 0848 Last data filed at 04/21/2020 1831 Gross per 24 hour  Intake 720 ml  Output 200 ml  Net  520 ml   Filed Weights   04/10/20 1710 04/10/20 2120  Weight: 76.2 kg 76.2 kg    Examination: General: No acute respiratory distress Lungs: CTA B without wheezing Cardiovascular: RRR without murmur Abdomen: NT/ND, soft, no rebound, bowel sounds positive Extremities: No edema B LE  CBC: Recent Labs  Lab 04/18/20 0336 04/21/20 0333 04/22/20 0355  WBC  21.7* 21.8* 23.1*  NEUTROABS 18.9*  --   --   HGB 11.1* 10.2* 10.3*  HCT 33.2* 29.9* 30.4*  MCV 88.5 86.7 87.6  PLT 453* 373 093   Basic Metabolic Panel: Recent Labs  Lab 04/16/20 0328 04/17/20 0322 04/18/20 0336 04/21/20 0333 04/22/20 0355  NA 129*   < > 131* 132* 133*  K 3.9   < > 4.0 3.5 3.4*  CL 93*   < > 96* 98 99  CO2 28   < > 27 26 25   GLUCOSE 160*   < > 118* 130* 105*  BUN 27*   < > 24* 21 14  CREATININE 0.63   < > 0.58* 0.49* 0.50*  CALCIUM 8.8*   < > 8.6* 8.7* 8.7*  MG 2.3  --   --   --  1.7  PHOS  --   --   --   --  2.7   < > = values in this interval not displayed.   GFR: Estimated Creatinine Clearance: 90.8 mL/min (A) (by C-G formula based on SCr of 0.5 mg/dL (L)).  Liver Function Tests: Recent Labs  Lab 04/18/20 0336 04/21/20 0333 04/22/20 0355  AST 56* 442* 175*  ALT 76* 472* 343*  ALKPHOS 64 106 88  BILITOT 0.7 0.5 0.6  PROT 6.2* 6.4* 6.0*  ALBUMIN 2.2* 2.3* 2.2*     Recent Results (from the past 240 hour(s))  Culture, blood (Routine X 2) w Reflex to ID Panel     Status: None   Collection Time: 04/13/20  6:35 AM   Specimen: BLOOD  Result Value Ref Range Status   Specimen Description   Final    BLOOD RIGHT ANTECUBITAL Performed at [facility], [place] [address], [place], [place] [address]    Special Requests   Final    BOTTLES DRAWN AEROBIC AND ANAEROBIC Blood Culture adequate volume Performed at [place] [address] [place], [place] [address]    Culture   Final    NO GROWTH 5 DAYS Performed at [facility], [place] [address], [place], [place] [address]    Report Status 04/18/2020 FINAL  Final  Culture, blood (Routine X 2) w Reflex to ID Panel     Status: None   Collection Time: 04/13/20  6:35 AM   Specimen: BLOOD RIGHT HAND  Result Value Ref Range Status   Specimen Description   Final    BLOOD RIGHT HAND Performed at [place] [address], [place], [place]  [address]    Special Requests   Final    BOTTLES DRAWN AEROBIC ONLY Blood Culture adequate volume Performed at [place] [address], [place], [place] [address]    Culture   Final    NO GROWTH 5 DAYS Performed at [facility], [place] [address], [place], [place] [address]    Report Status 04/18/2020 FINAL  Final     Scheduled Meds: . amLODipine  10 mg Oral Daily  . aspirin EC  81 mg Oral Daily  . enoxaparin (LOVENOX) injection  40 mg  Subcutaneous Q24H  . irbesartan  300 mg Oral Daily  . polyethylene glycol  17 g Oral Daily   Continuous Infusions: . sodium chloride 75 mL/hr at 04/22/20 0439     LOS: 10 days   Cherene Altes, MD [facility] Office  [phone] Pager - Text Page per Shea Evans  If 7PM-7AM, please contact night-coverage per [facility] 04/22/2020, 8:48 AM

## 2020-04-23 DIAGNOSIS — U071 COVID-19: Secondary | ICD-10-CM | POA: Diagnosis not present

## 2020-04-23 DIAGNOSIS — E222 Syndrome of inappropriate secretion of antidiuretic hormone: Secondary | ICD-10-CM | POA: Diagnosis not present

## 2020-04-23 LAB — COMPREHENSIVE METABOLIC PANEL
ALT: 326 U/L — ABNORMAL HIGH (ref 0–44)
AST: 158 U/L — ABNORMAL HIGH (ref 15–41)
Albumin: 2.4 g/dL — ABNORMAL LOW (ref 3.5–5.0)
Alkaline Phosphatase: 94 U/L (ref 38–126)
Anion gap: 6 (ref 5–15)
BUN: 15 mg/dL (ref 8–23)
CO2: 23 mmol/L (ref 22–32)
Calcium: 8.4 mg/dL — ABNORMAL LOW (ref 8.9–10.3)
Chloride: 100 mmol/L (ref 98–111)
Creatinine, Ser: 0.54 mg/dL — ABNORMAL LOW (ref 0.61–1.24)
GFR, Estimated: >60 mL/min (ref >60)
Glucose, Bld: 105 mg/dL — ABNORMAL HIGH (ref 70–99)
Potassium: 4.3 mmol/L (ref 3.5–5.1)
Sodium: 129 mmol/L — ABNORMAL LOW (ref 135–145)
Total Bilirubin: 0.4 mg/dL (ref 0.3–1.2)
Total Protein: 6.3 g/dL — ABNORMAL LOW (ref 6.5–8.1)

## 2020-04-23 LAB — MAGNESIUM: Magnesium: 1.8 mg/dL (ref 1.7–2.4)

## 2020-04-23 NOTE — Progress Notes (Signed)
Physical Therapy Treatment Patient Details Name: Terry Page. MRN: [id number] DOB: 1955/08/04 Today's Date: 04/23/2020    History of Present Illness 65 yo male presents with complaints of shortness of breath. Pt tested positive for COVID 04/10/20.   on 4/15 pt developed nausea and vomiting and abdominal distention.  A CT scan was ordered.  This shows a small bowel obstruction with transition point in the distal small bowel  [id number]:[id number] with both emphysema, spontaneous pneumothorax (2019 and 2021), SIADH, HTN, hyperlipidemia, prostate cancer and left lung cancer (currently receiving radiation therapy).    PT Comments    Patient is  Slowly improving in mobility. Patient does state that he still needs RW. SPO2 96 % after ambulation. Encouraged patient to ask staff to assist with ambulation.  Follow Up Recommendations  SNF     Equipment Recommendations  Rolling walker with 5" wheels    Recommendations for Other Services       Precautions / Restrictions Precautions Precautions: Fall Precaution Comments: O2 and HR stable Restrictions Weight Bearing Restrictions: No    Mobility  Bed Mobility               General bed mobility comments: in recliner upon arrival    Transfers   Equipment used: Rolling walker (2 wheeled) Transfers: Sit to/from Stand Sit to Stand: Supervision         General transfer comment: extra time to rise, use of RW  Ambulation/Gait Ambulation/Gait assistance: Supervision Gait Distance (Feet): 120 Feet Assistive device: Rolling walker (2 wheeled) Gait Pattern/deviations: Step-through pattern     General Gait Details: gait improved with steadiness, still needs Rw   Stairs             Wheelchair Mobility    Modified Rankin (Stroke Patients Only)       Balance     Sitting balance-Leahy Scale: Good     Standing balance support: No upper extremity supported;During functional activity Standing balance-Leahy Scale: Fair                               Cognition Arousal/Alertness: Awake/alert Behavior During Therapy: Flat affect                                          Exercises      General Comments        Pertinent Vitals/Pain Pain Assessment: No/denies pain    Home Living                      Prior Function            PT Goals (current goals can now be found in the care plan section) Progress towards PT goals: Progressing toward goals    Frequency    Min 3X/week      PT Plan Current plan remains appropriate    Co-evaluation              AM-PAC PT "6 Clicks" Mobility   Outcome Measure  Help needed turning from your back to your side while in a flat bed without using bedrails?: None Help needed moving from lying on your back to sitting on the side of a flat bed without using bedrails?: None Help needed moving to and from a bed to a chair (including a wheelchair)?: A Little  Help needed standing up from a chair using your arms (e.g., wheelchair or bedside chair)?: A Little Help needed to walk in hospital room?: A Little Help needed climbing 3-5 steps with a railing? : A Lot 6 Click Score: 19    End of Session   Activity Tolerance: Patient tolerated treatment well Patient left: in chair;with call bell/phone within reach Nurse Communication: Mobility status PT Visit Diagnosis: Other abnormalities of gait and mobility (R26.89);Unsteadiness on feet (R26.81)     Time: 2707-8675 PT Time Calculation (min) (ACUTE ONLY): 21 min  Charges:  $Gait Training: 8-22 mins                     [place] Pager [phone] Office [phone]    Claretha Cooper 04/23/2020, 1:46 PM

## 2020-04-23 NOTE — Progress Notes (Signed)
Terry Page.  [id number] DOB: 02-22-1955 DOA: 04/10/2020 PCP: [name], MD    Brief Narrative:  65 year old with a history of COPD, spontaneous pneumothorax 2019 and 2021, SIADH, HTN, HLD, prostate cancer, and left lung cancer on XRT who presented to the ED with shortness of breath 04/10/20. He was found to be hypoxic and COVID-positive. CXR noted extensive patchy infiltrates throughout the right upper lobe with a small right pleural effusion.  Date of Positive COVID Test:  04/10/20  Last day of COVID isolation: 04/20/20  Vaccination Status: Unknown  COVID-19 specific Treatment: Remdesivir 4/9 > 4/13 Steroid 4/9 > 4/20  Antimicrobials:  Ceftriaxone 4/9 > 4/13 Azithromycin 4/9 > 4/13 Cefazolin 4/14 > 4/15 Vancomycin 4/11 > 4/13  DVT prophylaxis: Lovenox  Subjective: Afebrile.  Vital signs stable.  Saturation 98% on room air.  LFTs continue to improve.  Assessment & Plan:  SBO - resolved Noted on CT abdomen/pelvis -followed by General Surgery -NG tube required transiently but now has been removed - diet advanced to regular without difficulty - keep potassium at 4.0 and magnesium at 2.0 as able  Acute hypoxic respiratory failure Now resolved with patient stable on room air  COVID Pneumonia Completed 5 days of Remdesivir therapy - discontinued steroid dosing 4/20 - clinically resolved - completed 10 full days of isolation as of 4/19PM  Bacterial pneumonia - sepsis Has completed a full course of azithromycin and ceftriaxone - no persisting symptoms of infection  Transaminitis Newly appreciated 4/20 - possibly related to COVID/Remdesivir or SBO or both - improving steadily  COPD with acute exacerbation 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 - clinically stable at present  Staph hominis bacteremia Blood culture positive 3 out of 4 4/9 -TTE 4/13 with no evidence of valvular vegetation -ID evaluated -repeat cultures 4/12  with no growth  -completed antibiotic therapy 4/16 -no clinical evidence of ongoing infection  Chronic SIADH Stable sodium with fluid restriction of 1200 cc/day  Primary cancer left upper lobe lung Spiculated mass identified during hospitalization 12/19 -initially managed with XRT -another area of concern has developed within the LUL and was confirmed via PET 02/06/2020 - ongoing car per Radiation Oncology  History of prostate cancer Status post XRT  HTN Blood pressure controlled  Acute on chronic lumbago Symptoms well controlled at present  HLD Holding atorvastatin in setting of transaminitis  Disposition Awaiting SNF bed for short-term rehab stay   Code Status: FULL CODE Family Communication: No family present at time of exam Status is: Inpatient  Remains inpatient appropriate because:Inpatient level of care appropriate due to severity of illness   Dispo: The patient is from: Home              Anticipated d/c is to: SNF              Patient currently is not medically stable to d/c.   Difficult to place patient No  Consultants:  General surgery ID  Objective: Blood pressure 128/86, pulse 99, temperature 98.6 F (37 C), resp. rate 18, height 5\' 9"  (1.753 m), weight 76.2 kg, SpO2 98 %.  Intake/Output Summary (Last 24 hours) at 04/23/2020 1017 Last data filed at 04/22/2020 2200 Gross per 24 hour  Intake 147.68 ml  Output 375 ml  Net -227.32 ml   Filed Weights   04/10/20 1710 04/10/20 2120  Weight: 76.2 kg 76.2 kg    Examination: General: No acute respiratory distress Lungs: CTA B  Cardiovascular: RRR  Abdomen:  NT/ND, soft, no rebound, bowel sounds positive Extremities: No edema B LE  CBC: Recent Labs  Lab 04/18/20 0336 04/21/20 0333 04/22/20 0355  WBC 21.7* 21.8* 23.1*  NEUTROABS 18.9*  --   --   HGB 11.1* 10.2* 10.3*  HCT 33.2* 29.9* 30.4*  MCV 88.5 86.7 87.6  PLT 453* 373 841   Basic Metabolic Panel: Recent Labs  Lab 04/21/20 0333  04/22/20 0355 04/23/20 0340  NA 132* 133* 129*  K 3.5 3.4* 4.3  CL 98 99 100  CO2 26 25 23   GLUCOSE 130* 105* 105*  BUN 21 14 15   CREATININE 0.49* 0.50* 0.54*  CALCIUM 8.7* 8.7* 8.4*  MG  --  1.7 1.8  PHOS  --  2.7  --    GFR: Estimated Creatinine Clearance: 90.8 mL/min (A) (by C-G formula based on SCr of 0.54 mg/dL (L)).  Liver Function Tests: Recent Labs  Lab 04/18/20 0336 04/21/20 0333 04/22/20 0355 04/23/20 0340  AST 56* 442* 175* 158*  ALT 76* 472* 343* 326*  ALKPHOS 64 106 88 94  BILITOT 0.7 0.5 0.6 0.4  PROT 6.2* 6.4* 6.0* 6.3*  ALBUMIN 2.2* 2.3* 2.2* 2.4*    Scheduled Meds: . amLODipine  10 mg Oral Daily  . aspirin EC  81 mg Oral Daily  . enoxaparin (LOVENOX) injection  40 mg Subcutaneous Q24H  . irbesartan  300 mg Oral Daily  . polyethylene glycol  17 g Oral Daily      LOS: 11 days   Cherene Altes, MD [facility] Office  [phone] Pager - Text Page per Amion  If 7PM-7AM, please contact night-coverage per Amion 04/23/2020, 10:17 AM

## 2020-04-24 ENCOUNTER — Inpatient Hospital Stay (HOSPITAL_COMMUNITY): Payer: Medicare HMO

## 2020-04-24 DIAGNOSIS — J9601 Acute respiratory failure with hypoxia: Secondary | ICD-10-CM | POA: Diagnosis not present

## 2020-04-24 DIAGNOSIS — J1282 Pneumonia due to coronavirus disease 2019: Secondary | ICD-10-CM | POA: Diagnosis not present

## 2020-04-24 DIAGNOSIS — U071 COVID-19: Secondary | ICD-10-CM | POA: Diagnosis not present

## 2020-04-24 DIAGNOSIS — R1084 Generalized abdominal pain: Secondary | ICD-10-CM | POA: Diagnosis not present

## 2020-04-24 MED ORDER — ACETAMINOPHEN 325 MG PO TABS
650.0000 mg | ORAL_TABLET | Freq: Four times a day (QID) | ORAL | Status: DC | PRN
Start: 2020-04-24 — End: 2020-05-11
  Administered 2020-04-24 – 2020-05-10 (×16): 650 mg via ORAL
  Filled 2020-04-24 (×16): qty 2

## 2020-04-24 MED ORDER — POLYETHYLENE GLYCOL 3350 17 G PO PACK
17.0000 g | PACK | Freq: Two times a day (BID) | ORAL | Status: DC
  Administered 2020-04-24 – 2020-04-26 (×4): 17 g via ORAL
  Filled 2020-04-24 (×4): qty 1

## 2020-04-24 MED ORDER — SIMETHICONE 80 MG PO CHEW
80.0000 mg | CHEWABLE_TABLET | Freq: Three times a day (TID) | ORAL | Status: AC
  Administered 2020-04-24 – 2020-04-25 (×4): 80 mg via ORAL
  Filled 2020-04-24 (×4): qty 1

## 2020-04-24 MED ORDER — IBUPROFEN 800 MG PO TABS
800.0000 mg | ORAL_TABLET | Freq: Three times a day (TID) | ORAL | Status: DC
  Administered 2020-04-24 – 2020-04-26 (×6): 800 mg via ORAL
  Filled 2020-04-24 (×6): qty 1

## 2020-04-24 NOTE — TOC Progression Note (Signed)
Transition of Care (TOC) - Progression Note    Patient Details  Name: Terry Page. MRN: [id number] Date of Birth: 1955-10-11  Transition of [facility]) CM/SW Contact  Ross Ludwig, [place] Phone Number: 04/24/2020, 11:57 AM  Clinical Narrative:    Patient has been approved for SNF placement at [facility].     Expected Discharge Plan and Services                                                 Social Determinants of Health (SDOH) Interventions    Readmission Risk Interventions Readmission Risk Prevention Plan 10/24/2017  Post Dischage Appt Complete  Medication Screening Complete  Transportation Screening Complete  PCP follow-up Complete  Some recent data might be hidden

## 2020-04-24 NOTE — Progress Notes (Signed)
   04/24/20 0525  Assess: MEWS Score  Temp (!) 102.9 F (39.4 C)  BP 127/86  Pulse Rate (!) 110  Resp 20  Level of Consciousness Alert  SpO2 100 %  O2 Device Room Air  Assess: MEWS Score  MEWS Temp 2  MEWS Systolic 0  MEWS Pulse 1  MEWS RR 0  MEWS LOC 0  MEWS Score 3  MEWS Score Color Yellow  Assess: if the MEWS score is Yellow or Red  Were vital signs taken at a resting state? Yes  Focused Assessment No change from prior assessment  Early Detection of Sepsis Score *See Row Information* Medium  MEWS guidelines implemented *See Row Information* No, previously yellow, continue vital signs every 4 hours  Treat  MEWS Interventions Administered prn meds/treatments;Escalated (See documentation below)  Pain Scale 0-10  Pain Score 0  Take Vital Signs  Increase Vital Sign Frequency  Yellow: Q 2hr X 2 then Q 4hr X 2, if remains yellow, continue Q 4hrs  Escalate  MEWS: Escalate Yellow: discuss with charge nurse/RN and consider discussing with provider and RRT  Notify: Charge Nurse/RN  Name of Charge Nurse/RN Notified Ellen Henri, RN  Date Charge Nurse/RN Notified 04/24/20  Time Charge Nurse/RN Notified 0540  Notify: Provider  Provider Name/Title Jeannette Corpus, RN  Date Provider Notified 04/24/20  Time Provider Notified 0532  Notification Type Page  Notification Reason Change in status  Provider response No new orders

## 2020-04-24 NOTE — Progress Notes (Signed)
Terry Page.  [id number]:[id number] DOB: March 17, 1955 DOA: 04/10/2020 PCP: Terry Macadam, MD    Brief Narrative:  61JS with a history of COPD, spontaneous pneumothorax 2019 and 2021, SIADH, HTN, HLD, prostate cancer, and left lung cancer on XRT who presented to the ED with shortness of breath 04/10/20. He was found to be hypoxic and COVID-positive. CXR noted extensive patchy infiltrates throughout the right upper lobe with a small right pleural effusion.  Date of Positive COVID Test:  04/10/20  Last day of COVID isolation: 04/20/20  Vaccination Status: Unknown  COVID-19 specific Treatment: Remdesivir 4/9 > 4/13 Steroid 4/9 > 4/20  Antimicrobials:  Ceftriaxone 4/9 > 4/13 Azithromycin 4/9 > 4/13 Cefazolin 4/14 > 4/15 Vancomycin 4/11 > 4/13  DVT prophylaxis: Lovenox  Subjective: Patient developed fever of 102.9 at 05:00 this a.m.  Otherwise his vital signs are stable.  He is not presently febrile. He is alert and states he feels well overall. He admits to acute worsening of his chronic low back pain, for which he usually takes Alleve at home. This is associated with some radiation of discomfort into both legs. He denies sob, chest pain, or n/v. He does feel his abdom is "a bit more bloated today" but states he has a good appetite and is tolerating oral intake w/o trouble.   Assessment & Plan:  FUO Unclear etiology of sporadic fever this AM - w/ recent bacteremia will recheck blood cultures and hold off on empiric abx for now - given acute worsening of low back pain will check MRI to r/o diskitis - consider possible recurrence of SBO as well, w/ bacterial translocation leading to fever - clinically the pt does not appear toxic/appears well    Staph hominis bacteremia 04/10/20 Blood culture positive 2 out of 3 04/10/20 -TTE 4/13 with no evidence of valvular vegetation - ID evaluated - repeat cultures 4/12 with no growth  - completed antibiotic therapy 4/16 - recurrence of fever worrisome -  recheck blood cultures today - I do see some reports of diskitis from this organism in the literature    Acute on chronic lumbago Experiencing an acute flair in pain, with some radicular type radiation into the hips/legs - MRI of lumbar spine today - add motrin - attempt to avoid narcotics w/ recent SBO  SBO - resolved Noted on CT abdomen/pelvis -followed by General Surgery -NG tube required transiently but now has been removed - diet advanced to regular without difficulty - keep potassium at 4.0 and magnesium at 2.0 as able - abdom mildly protuberant today   Acute hypoxic respiratory failure Now resolved with patient stable on room air  COVID Pneumonia Completed 5 days of Remdesivir therapy - discontinued steroid dosing 4/20 - clinically resolved - completed 10 full days of isolation as of 4/19PM  Bacterial pneumonia - sepsis Has completed a full course of azithromycin and ceftriaxone - no persisting symptoms of pulmonary infection  Transaminitis Newly appreciated 4/20 - possibly related to COVID/Remdesivir or SBO or both - improving steadily - recheck in AM - consider imaging of liver if fevers persists   COPD with acute exacerbation 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 - clinically stable at present  Chronic SIADH Stable sodium with fluid restriction of 1200 cc/day  Primary cancer left upper lobe lung Spiculated mass identified during hospitalization 12/19 - initially managed with XRT -another area of concern has developed within the LUL and was confirmed via PET 02/06/2020 - ongoing car per Radiation  Oncology  History of prostate cancer Status post XRT  HTN Blood pressure controlled  HLD Holding atorvastatin in setting of transaminitis  Disposition Awaiting SNF bed for short-term rehab stay   Code Status: FULL CODE Family Communication: No family present at time of exam Status is: Inpatient  Remains inpatient appropriate  because:Inpatient level of care appropriate due to severity of illness   Dispo: The patient is from: Home              Anticipated d/c is to: SNF              Patient currently is not medically stable to d/c.   Difficult to place patient No  Consultants:  General surgery ID  Objective: Blood pressure 122/77, pulse (!) 102, temperature 99.5 F (37.5 C), temperature source Oral, resp. rate (!) 21, height 5\' 9"  (1.753 m), weight 76.2 kg, SpO2 97 %.  Intake/Output Summary (Last 24 hours) at 04/24/2020 0904 Last data filed at 04/24/2020 0500 Gross per 24 hour  Intake 480 ml  Output 700 ml  Net -220 ml   Filed Weights   04/10/20 1710 04/10/20 2120  Weight: 76.2 kg 76.2 kg    Examination: General: No acute respiratory distress Lungs: CTA B - no wheezing  Cardiovascular: RRR  Abdomen: NT, soft, mild distended, no rebound, bowel sounds positive Extremities: No edema B LE  CBC: Recent Labs  Lab 04/18/20 0336 04/21/20 0333 04/22/20 0355  WBC 21.7* 21.8* 23.1*  NEUTROABS 18.9*  --   --   HGB 11.1* 10.2* 10.3*  HCT 33.2* 29.9* 30.4*  MCV 88.5 86.7 87.6  PLT 453* 373 962   Basic Metabolic Panel: Recent Labs  Lab 04/21/20 0333 04/22/20 0355 04/23/20 0340  NA 132* 133* 129*  K 3.5 3.4* 4.3  CL 98 99 100  CO2 26 25 23   GLUCOSE 130* 105* 105*  BUN 21 14 15   CREATININE 0.49* 0.50* 0.54*  CALCIUM 8.7* 8.7* 8.4*  MG  --  1.7 1.8  PHOS  --  2.7  --    GFR: Estimated Creatinine Clearance: 90.8 mL/min (A) (by C-G formula based on SCr of 0.54 mg/dL (L)).  Liver Function Tests: Recent Labs  Lab 04/18/20 0336 04/21/20 0333 04/22/20 0355 04/23/20 0340  AST 56* 442* 175* 158*  ALT 76* 472* 343* 326*  ALKPHOS 64 106 88 94  BILITOT 0.7 0.5 0.6 0.4  PROT 6.2* 6.4* 6.0* 6.3*  ALBUMIN 2.2* 2.3* 2.2* 2.4*    Scheduled Meds: . amLODipine  10 mg Oral Daily  . aspirin EC  81 mg Oral Daily  . enoxaparin (LOVENOX) injection  40 mg Subcutaneous Q24H  . irbesartan  300 mg  Oral Daily  . polyethylene glycol  17 g Oral Daily      LOS: 12 days   Cherene Altes, MD [facility] Office  [phone] Pager - Text Page per Amion  If 7PM-7AM, please contact night-coverage per Amion 04/24/2020, 9:04 AM

## 2020-04-25 ENCOUNTER — Inpatient Hospital Stay (HOSPITAL_COMMUNITY): Payer: Medicare HMO

## 2020-04-25 DIAGNOSIS — J1282 Pneumonia due to coronavirus disease 2019: Secondary | ICD-10-CM | POA: Diagnosis not present

## 2020-04-25 DIAGNOSIS — R1084 Generalized abdominal pain: Secondary | ICD-10-CM | POA: Diagnosis not present

## 2020-04-25 DIAGNOSIS — U071 COVID-19: Secondary | ICD-10-CM | POA: Diagnosis not present

## 2020-04-25 DIAGNOSIS — J9601 Acute respiratory failure with hypoxia: Secondary | ICD-10-CM | POA: Diagnosis not present

## 2020-04-25 LAB — COMPREHENSIVE METABOLIC PANEL
ALT: 242 U/L — ABNORMAL HIGH (ref 0–44)
AST: 108 U/L — ABNORMAL HIGH (ref 15–41)
Albumin: 2.2 g/dL — ABNORMAL LOW (ref 3.5–5.0)
Alkaline Phosphatase: 112 U/L (ref 38–126)
Anion gap: 7 (ref 5–15)
BUN: 14 mg/dL (ref 8–23)
CO2: 27 mmol/L (ref 22–32)
Calcium: 8.4 mg/dL — ABNORMAL LOW (ref 8.9–10.3)
Chloride: 99 mmol/L (ref 98–111)
Creatinine, Ser: 0.6 mg/dL — ABNORMAL LOW (ref 0.61–1.24)
GFR, Estimated: >60 mL/min (ref >60)
Glucose, Bld: 131 mg/dL — ABNORMAL HIGH (ref 70–99)
Potassium: 3.4 mmol/L — ABNORMAL LOW (ref 3.5–5.1)
Sodium: 133 mmol/L — ABNORMAL LOW (ref 135–145)
Total Bilirubin: 0.8 mg/dL (ref 0.3–1.2)
Total Protein: 6.5 g/dL (ref 6.5–8.1)

## 2020-04-25 LAB — CBC
HCT: 28.3 % — ABNORMAL LOW (ref 39.0–52.0)
Hemoglobin: 9.3 g/dL — ABNORMAL LOW (ref 13.0–17.0)
MCH: 29.4 pg (ref 26.0–34.0)
MCHC: 32.9 g/dL (ref 30.0–36.0)
MCV: 89.6 fL (ref 80.0–100.0)
Platelets: 409 10*3/uL — ABNORMAL HIGH (ref 150–400)
RBC: 3.16 MIL/uL — ABNORMAL LOW (ref 4.22–5.81)
RDW: 14.6 % (ref 11.5–15.5)
WBC: 34.9 10*3/uL — ABNORMAL HIGH (ref 4.0–10.5)
nRBC: 0 % (ref 0.0–0.2)

## 2020-04-25 LAB — C-REACTIVE PROTEIN: CRP: 27.9 mg/dL — ABNORMAL HIGH (ref <1.0)

## 2020-04-25 MED ORDER — POTASSIUM CHLORIDE CRYS ER 20 MEQ PO TBCR
40.0000 meq | EXTENDED_RELEASE_TABLET | Freq: Two times a day (BID) | ORAL | Status: AC
  Administered 2020-04-25 – 2020-04-26 (×3): 40 meq via ORAL
  Filled 2020-04-25 (×3): qty 2

## 2020-04-25 MED ORDER — IOHEXOL 300 MG/ML  SOLN
100.0000 mL | Freq: Once | INTRAMUSCULAR | Status: AC | PRN
  Administered 2020-04-25: 100 mL via INTRAVENOUS

## 2020-04-25 MED ORDER — IOHEXOL 9 MG/ML PO SOLN
500.0000 mL | ORAL | Status: AC
  Administered 2020-04-25 (×2): 500 mL via ORAL

## 2020-04-25 NOTE — Progress Notes (Signed)
Terry Page.  YTK:[id number] DOB: January 19, 1955 DOA: 04/10/2020 PCP: [name], MD    Brief Narrative:  53DD with a history of COPD, spontaneous pneumothorax 2019 and 2021, SIADH, HTN, HLD, prostate cancer, and left lung cancer on XRT who presented to the ED with shortness of breath 04/10/20. He was found to be hypoxic and COVID-positive. CXR noted extensive patchy infiltrates throughout the right upper lobe with a small right pleural effusion.  Date of Positive COVID Test:  04/10/20  Last day of COVID isolation: 04/20/20  Vaccination Status: Unknown  COVID-19 specific Treatment: Remdesivir 4/9 > 4/13 Steroid 4/9 > 4/20  Antimicrobials:  Ceftriaxone 4/9 > 4/13 Azithromycin 4/9 > 4/13 Cefazolin 4/14 > 4/15 Vancomycin 4/11 > 4/13  DVT prophylaxis: Lovenox  Subjective: No recurrence of fever over last 24 hours.  Vital signs otherwise stable.  LFTs continue to improve.  MRI lumbar spine was unrevealing.  WBC continues to climb and is at 34.9 today.  Unfortunately the patient has developed the return of significant diffuse abdominal cramping, nausea, and a limited episode of vomiting today.  Denies headache or chills.  No diarrhea.  Assessment & Plan:  FUO - Leukocytosis  Unclear etiology of sporadic fever 4/23 AM - MRI w/o evidence of diskitis - clinically the pt does not appear toxic/appears well -while fever has returned climbing WBC is concerning -check CT abdomen/liver  SBO - possible recurrent  Noted on CT abdomen/pelvis 4/15 - followed by General Surgery - NG tube required transiently but now has been removed - diet had been advanced to regular without difficulty - keep potassium at 4.0 and magnesium at 2.0 as able -with the onset of new symptoms today we will repeat CT abdomen to evaluate for recurrent SBO -patient swears he feels he can still eat and is quite hungry therefore we will attempt to continue clear liquids for now  Transaminitis Newly appreciated 4/20 -  possibly related to COVID/Remdesivir or SBO or both - improving steadily - recheck in AM - consider imaging of liver if fevers persists   Staph hominis bacteremia 04/10/20 Blood culture positive 2 out of 3 04/10/20 -TTE 4/13 with no evidence of valvular vegetation - ID evaluated - repeat cultures 4/12 with no growth  - repeat blood cultures 4/23 thus far no growth  - completed antibiotic therapy 4/16 - recurrence of fever worrisome  Primary cancer left upper lobe lung - New LLL and R lung nodules  Spiculated mass identified during hospitalization 12/19 - initially managed with XRT - another area of concern developed within the LUL and was confirmed via PET 02/06/2020 w/ pt undergoing further XRT - CT abdom 4/15 raised concern of NEW (since PET) cavitating nodule measuring 1.9 x 1.0 in the LLL as well as a 0.9 x 0.5cm subpleural nodule of the LLL - CT chest 4/9 additionally noted possible nodules in the R lung -we will ask his Rad-Onc Team to review the films and comment on Monday  Acute on chronic lumbago Has experienced intermittent acute flairs in pain, with some radicular type radiation into the hips/legs - MRI of lumbar spine 4/23 w/o acute findings - attempt to avoid narcotics w/ recent SBO  Hypokalemia Supplement potassium to goal of 4.0  Acute hypoxic respiratory failure Now resolved with patient stable on room air  COVID Pneumonia Completed 5 days of Remdesivir therapy - discontinued steroid dosing 4/20 - clinically resolved - completed 10 full days of isolation as of 4/19PM  Bacterial pneumonia - sepsis Has  completed a full course of azithromycin and ceftriaxone - no persisting symptoms of pulmonary infection  COPD with acute exacerbation 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 - clinically stable at present  Chronic SIADH Stable sodium with fluid restriction of 1200 cc/day  History of prostate cancer Status post XRT  HTN Blood pressure  controlled  HLD Holding atorvastatin in setting of transaminitis  Disposition Not yet ready for d/c w/ need to further evaluate fever/elevated WBC and new lung nodules   Code Status: FULL CODE Family Communication: No family present at time of exam Status is: Inpatient  Remains inpatient appropriate because:Inpatient level of care appropriate due to severity of illness   Dispo: The patient is from: Home              Anticipated d/c is to: SNF              Patient currently is not medically stable to d/c.   Difficult to place patient No  Consultants:  General surgery ID  Objective: Blood pressure (!) 138/91, pulse 99, temperature 98 F (36.7 C), temperature source Oral, resp. rate 19, height 5\' 9"  (1.753 m), weight 76.2 kg, SpO2 93 %.  Intake/Output Summary (Last 24 hours) at 04/25/2020 0933 Last data filed at 04/25/2020 0400 Gross per 24 hour  Intake 480 ml  Output 300 ml  Net 180 ml   Filed Weights   04/10/20 1710 04/10/20 2120  Weight: 76.2 kg 76.2 kg    Examination: General: No acute respiratory distress Lungs: CTA B without wheezing Cardiovascular: RRR  Abdomen: Protuberant/distended but soft, bowel sounds hyperactive, no rebound, no appreciable mass Extremities: No edema B lower extremities  CBC: Recent Labs  Lab 04/21/20 0333 04/22/20 0355 04/25/20 0347  WBC 21.8* 23.1* 34.9*  HGB 10.2* 10.3* 9.3*  HCT 29.9* 30.4* 28.3*  MCV 86.7 87.6 89.6  PLT 373 378 350*   Basic Metabolic Panel: Recent Labs  Lab 04/22/20 0355 04/23/20 0340 04/25/20 0347  NA 133* 129* 133*  K 3.4* 4.3 3.4*  CL 99 100 99  CO2 25 23 27   GLUCOSE 105* 105* 131*  BUN 14 15 14   CREATININE 0.50* 0.54* 0.60*  CALCIUM 8.7* 8.4* 8.4*  MG 1.7 1.8  --   PHOS 2.7  --   --    GFR: Estimated Creatinine Clearance: 90.8 mL/min (A) (by C-G formula based on SCr of 0.6 mg/dL (L)).  Liver Function Tests: Recent Labs  Lab 04/21/20 0333 04/22/20 0355 04/23/20 0340 04/25/20 0347   AST 442* 175* 158* 108*  ALT 472* 343* 326* 242*  ALKPHOS 106 88 94 112  BILITOT 0.5 0.6 0.4 0.8  PROT 6.4* 6.0* 6.3* 6.5  ALBUMIN 2.3* 2.2* 2.4* 2.2*    Scheduled Meds: . amLODipine  10 mg Oral Daily  . aspirin EC  81 mg Oral Daily  . enoxaparin (LOVENOX) injection  40 mg Subcutaneous Q24H  . ibuprofen  800 mg Oral TID  . irbesartan  300 mg Oral Daily  . polyethylene glycol  17 g Oral BID  . simethicone  80 mg Oral TID      LOS: 13 days   Cherene Altes, MD [facility] Office  [phone] Pager - Text Page per Amion  If 7PM-7AM, please contact night-coverage per Amion 04/25/2020, 9:33 AM

## 2020-04-26 ENCOUNTER — Inpatient Hospital Stay (HOSPITAL_COMMUNITY): Payer: Medicare HMO

## 2020-04-26 DIAGNOSIS — U071 COVID-19: Secondary | ICD-10-CM | POA: Diagnosis not present

## 2020-04-26 DIAGNOSIS — J9601 Acute respiratory failure with hypoxia: Secondary | ICD-10-CM | POA: Diagnosis not present

## 2020-04-26 DIAGNOSIS — J1282 Pneumonia due to coronavirus disease 2019: Secondary | ICD-10-CM | POA: Diagnosis not present

## 2020-04-26 DIAGNOSIS — R1084 Generalized abdominal pain: Secondary | ICD-10-CM | POA: Diagnosis not present

## 2020-04-26 LAB — COMPREHENSIVE METABOLIC PANEL
ALT: 148 U/L — ABNORMAL HIGH (ref 0–44)
AST: 37 U/L (ref 15–41)
Albumin: 2 g/dL — ABNORMAL LOW (ref 3.5–5.0)
Alkaline Phosphatase: 100 U/L (ref 38–126)
Anion gap: 9 (ref 5–15)
BUN: 11 mg/dL (ref 8–23)
CO2: 22 mmol/L (ref 22–32)
Calcium: 8.6 mg/dL — ABNORMAL LOW (ref 8.9–10.3)
Chloride: 99 mmol/L (ref 98–111)
Creatinine, Ser: 0.51 mg/dL — ABNORMAL LOW (ref 0.61–1.24)
GFR, Estimated: >60 mL/min (ref >60)
Glucose, Bld: 116 mg/dL — ABNORMAL HIGH (ref 70–99)
Potassium: 3.8 mmol/L (ref 3.5–5.1)
Sodium: 130 mmol/L — ABNORMAL LOW (ref 135–145)
Total Bilirubin: 0.5 mg/dL (ref 0.3–1.2)
Total Protein: 6.2 g/dL — ABNORMAL LOW (ref 6.5–8.1)

## 2020-04-26 LAB — CBC WITH DIFFERENTIAL/PLATELET
Abs Immature Granulocytes: NONE SEEN 10*3/uL (ref 0.00–0.07)
Band Neutrophils: 0 %
Basophils Relative: 0 %
Blasts: NONE SEEN %
Eosinophils Relative: 1 %
HCT: 26.2 % — ABNORMAL LOW (ref 39.0–52.0)
Hemoglobin: 9 g/dL — ABNORMAL LOW (ref 13.0–17.0)
Immature Granulocytes: NONE SEEN %
Lymphocytes Relative: 2 %
MCH: 30.2 pg (ref 26.0–34.0)
MCHC: 34.4 g/dL (ref 30.0–36.0)
MCV: 87.9 fL (ref 80.0–100.0)
Metamyelocytes Relative: NONE SEEN %
Monocytes Relative: 1 %
Myelocytes: NONE SEEN %
Neutrophils Relative %: 96 %
Platelets: 393 10*3/uL (ref 150–400)
Promyelocytes Relative: NONE SEEN %
RBC Morphology: NORMAL
RBC: 2.98 MIL/uL — ABNORMAL LOW (ref 4.22–5.81)
RDW: 14.5 % (ref 11.5–15.5)
WBC Morphology: NORMAL
WBC: 26.8 10*3/uL — ABNORMAL HIGH (ref 4.0–10.5)
nRBC: 0 % (ref 0.0–0.2)
nRBC: 0 /100 WBC

## 2020-04-26 LAB — MAGNESIUM: Magnesium: 1.7 mg/dL (ref 1.7–2.4)

## 2020-04-26 MED ORDER — VANCOMYCIN HCL 1500 MG/300ML IV SOLN
1500.0000 mg | Freq: Once | INTRAVENOUS | Status: AC
  Administered 2020-04-26: 1500 mg via INTRAVENOUS
  Filled 2020-04-26: qty 300

## 2020-04-26 MED ORDER — FUROSEMIDE 10 MG/ML IJ SOLN
60.0000 mg | Freq: Once | INTRAMUSCULAR | Status: AC
  Administered 2020-04-26: 60 mg via INTRAVENOUS
  Filled 2020-04-26: qty 6

## 2020-04-26 MED ORDER — VANCOMYCIN HCL 1000 MG/200ML IV SOLN
1000.0000 mg | Freq: Two times a day (BID) | INTRAVENOUS | Status: DC
  Administered 2020-04-27 – 2020-04-30 (×7): 1000 mg via INTRAVENOUS
  Filled 2020-04-26 (×7): qty 200

## 2020-04-26 MED ORDER — METHYLPREDNISOLONE SODIUM SUCC 40 MG IJ SOLR
40.0000 mg | Freq: Four times a day (QID) | INTRAMUSCULAR | Status: AC
  Administered 2020-04-26 – 2020-04-27 (×4): 40 mg via INTRAVENOUS
  Filled 2020-04-26 (×4): qty 1

## 2020-04-26 MED ORDER — SODIUM CHLORIDE 0.9 % IV SOLN
2.0000 g | Freq: Three times a day (TID) | INTRAVENOUS | Status: DC
  Administered 2020-04-26 – 2020-04-30 (×12): 2 g via INTRAVENOUS
  Filled 2020-04-26 (×12): qty 2

## 2020-04-26 MED ORDER — PREDNISONE 20 MG PO TABS
40.0000 mg | ORAL_TABLET | Freq: Every day | ORAL | Status: AC
  Administered 2020-04-28 – 2020-05-01 (×4): 40 mg via ORAL
  Filled 2020-04-26 (×4): qty 2

## 2020-04-26 MED ORDER — LEVALBUTEROL HCL 0.63 MG/3ML IN NEBU
0.6300 mg | INHALATION_SOLUTION | Freq: Two times a day (BID) | RESPIRATORY_TRACT | Status: DC
  Administered 2020-04-26 – 2020-04-27 (×2): 0.63 mg via RESPIRATORY_TRACT
  Filled 2020-04-26 (×2): qty 3

## 2020-04-26 MED ORDER — IPRATROPIUM-ALBUTEROL 0.5-2.5 (3) MG/3ML IN SOLN
3.0000 mL | Freq: Once | RESPIRATORY_TRACT | Status: AC
  Administered 2020-04-26: 3 mL via RESPIRATORY_TRACT
  Filled 2020-04-26: qty 3

## 2020-04-26 MED ORDER — SODIUM CHLORIDE 0.9 % IV SOLN
2.0000 g | Freq: Once | INTRAVENOUS | Status: AC
  Administered 2020-04-26: 2 g via INTRAVENOUS
  Filled 2020-04-26: qty 2

## 2020-04-26 MED ORDER — METHYLPREDNISOLONE SODIUM SUCC 125 MG IJ SOLR
125.0000 mg | Freq: Once | INTRAMUSCULAR | Status: AC
  Administered 2020-04-26: 125 mg via INTRAVENOUS
  Filled 2020-04-26: qty 2

## 2020-04-26 NOTE — Plan of Care (Signed)
  Problem: Health Behavior/Discharge Planning: Goal: Ability to manage health-related needs will improve Outcome: Progressing   Problem: Clinical Measurements: Goal: Ability to maintain clinical measurements within normal limits will improve Outcome: Progressing Goal: Will remain free from infection Outcome: Progressing Goal: Diagnostic test results will improve Outcome: Progressing Goal: Respiratory complications will improve Outcome: Progressing Goal: Cardiovascular complication will be avoided Outcome: Progressing   Problem: Activity: Goal: Risk for activity intolerance will decrease Outcome: Progressing   Problem: Nutrition: Goal: Adequate nutrition will be maintained Outcome: Progressing   Problem: Coping: Goal: Level of anxiety will decrease Outcome: Progressing   Problem: Elimination: Goal: Will not experience complications related to urinary retention Outcome: Progressing   Problem: Pain Managment: Goal: General experience of comfort will improve Outcome: Progressing   Problem: Safety: Goal: Ability to remain free from injury will improve Outcome: Progressing   Problem: Activity: Goal: Ability to tolerate increased activity will improve Outcome: Progressing   Problem: Respiratory: Goal: Ability to maintain adequate ventilation will improve Outcome: Progressing Goal: Ability to maintain a clear airway will improve Outcome: Progressing   Problem: Coping: Goal: Psychosocial and spiritual needs will be supported Outcome: Progressing   Problem: Respiratory: Goal: Complications related to the disease process, condition or treatment will be avoided or minimized Outcome: Progressing

## 2020-04-26 NOTE — Progress Notes (Signed)
Terry Page.  HFW:[id number] DOB: 15-Dec-1955 DOA: 04/10/2020 PCP: [name], MD    Brief Narrative:  1DX with a history of COPD, spontaneous pneumothorax 2019 and 2021, SIADH, HTN, HLD, prostate cancer, and left lung cancer on XRT who presented to the ED with shortness of breath 04/10/20. He was found to be hypoxic and COVID-positive. CXR noted extensive patchy infiltrates throughout the right upper lobe with a small right pleural effusion.  Date of Positive COVID Test:  04/10/20  Last day of COVID isolation: 04/20/20  Vaccination Status: Unknown  COVID-19 specific Treatment: Remdesivir 4/9 > 4/13 Steroid 4/9 > 4/20  Antimicrobials:  Ceftriaxone 4/9 > 4/13 Azithromycin 4/9 > 4/13 Cefazolin 4/14 > 4/15 Vancomycin 4/11 > 4/13  DVT prophylaxis: Lovenox  Subjective: Afebrile for last 48 hours.  LFTs continue to improve.  WBC now trending downward.  CT abdomen pelvis without evidence of recurrent SBO but raises question of possible pleural effusions and pulmonary infiltrates which are new.  Clinically the patient has taken a significant turn for the worse today.  He has developed progressive worsening shortness of breath since early this morning associated with tachycardia and diaphoresis.  Follow-up chest x-ray notes progressive infiltrates in the right upper lobe and base and possible mild effusions.  For the time my exam the patient is able to talk and is not in extremis but is clearly somewhat dyspneic.  Assessment & Plan:  Acute hypoxic respiratory failure - COPD exac - Hospital acquired PNA Patient developed some shortness of breath last night and required low level oxygen support - this has worsened as the day had progressed - CXR notes worsening R lung infiltrates - initiate empiric coverage for hospital acquired PNA - IV steroid for bronchospasm - scheduled ned tx - upgrade to Progressive Care bed for closer monitoring   COPD with acute exacerbation Chronic bullous  emphysema with history of 2 prior spontaneous pneumothoraces - experiencing an acute COPD flair today - steroid tx initiated - cont nebs   Prior FUO - Leukocytosis  sporadic fever 4/23 AM - MRI w/o evidence of diskitis - clinically the pt does not appear toxic/appears well - CT abdomen/liver notes possible diarrheal illness but no small bowel obstruction and no liver lesions suggestive of infection -WBC improving - see above for apparent etiology of fever    SBO  Noted on CT abdomen/pelvis 4/15 - followed by General Surgery - NG tube required transiently but removed and short course - diet advanced to regular without difficulty - keep potassium at 4.0 and magnesium at 2.0 as able -with the onset of new abdominal symptoms CT abdomen was rechecked and fortunately revealed no evidence of recurrent SBO  Transaminitis Newly appreciated 4/20 - possibly related to COVID/Remdesivir or SBO or both - resolving  Staph hominis bacteremia 04/10/20 Blood culture positive 2 out of 3 04/10/20 -TTE 4/13 with no evidence of valvular vegetation - ID evaluated - repeat cultures 4/12 with no growth  - repeat blood cultures 4/23 thus far no growth  - completed antibiotic therapy for this indication 4/16   Primary cancer left upper lobe lung - New LLL and R lung nodules  Spiculated mass identified during hospitalization 12/19 - initially managed with XRT - another area of concern developed within the LUL and was confirmed via PET 02/06/2020 w/ pt undergoing further XRT - CT abdom 4/15 raised concern of NEW (since PET) cavitating nodule measuring 1.9 x 1.0 in the LLL as well as a 0.9 x 0.5cm  subpleural nodule of the LLL - CT chest 4/9 additionally noted possible nodules in the R lung - we will ask his Rad-Onc Team to review the films and comment when patient is stable again   Acute on chronic lumbago Has experienced intermittent acute flairs in pain, with some radicular type radiation into the hips/legs - MRI of lumbar spine  4/23 w/o acute findings - attempt to avoid narcotics w/ recent SBO  Hypokalemia Supplement potassium to goal of 4.0  COVID Pneumonia Completed 5 days of Remdesivir therapy - discontinued steroid dosing 4/20 - clinically resolved - completed 10 full days of isolation as of 4/19PM  Bacterial pneumonia - Sepsis POA Has completed a full course of azithromycin and ceftriaxone   Chronic SIADH Stable sodium   History of prostate cancer Status post XRT  HTN Blood pressure controlled  HLD Holding atorvastatin in setting of transaminitis  Disposition Not yet ready for d/c w/ need to further evaluate fever/elevated WBC and new lung nodules   Code Status: FULL CODE Family Communication: No family present at time of exam Status is: Inpatient  Remains inpatient appropriate because:Inpatient level of care appropriate due to severity of illness   Dispo: The patient is from: Home              Anticipated d/c is to: SNF              Patient currently is not medically stable to d/c.   Difficult to place patient No  Consultants:  General surgery ID  Objective: Blood pressure 115/79, pulse (!) 102, temperature 98.3 F (36.8 C), temperature source Oral, resp. rate 18, height 5\' 9"  (1.753 m), weight 76.2 kg, SpO2 93 %.  Intake/Output Summary (Last 24 hours) at 04/26/2020 0911 Last data filed at 04/25/2020 2229 Gross per 24 hour  Intake 120 ml  Output 1200 ml  Net -1080 ml   Filed Weights   04/10/20 1710 04/10/20 2120  Weight: 76.2 kg 76.2 kg    Examination: General: Modest respiratory distress today with wheezing and tachypnea Lungs: Tight diffuse expiratory wheezing with poor air movement bilateral bases Cardiovascular: Tachycardic but regular without murmur or rub Abdomen: Protuberant/distended but soft, bowel sounds hyperactive, no rebound, no appreciable mass Extremities: No edema BLE  CBC: Recent Labs  Lab 04/22/20 0355 04/25/20 0347 04/26/20 0355  WBC 23.1* 34.9*  26.8*  HGB 10.3* 9.3* 9.0*  HCT 30.4* 28.3* 26.2*  MCV 87.6 89.6 87.9  PLT 378 409* 109   Basic Metabolic Panel: Recent Labs  Lab 04/22/20 0355 04/23/20 0340 04/25/20 0347 04/26/20 0355  NA 133* 129* 133* 130*  K 3.4* 4.3 3.4* 3.8  CL 99 100 99 99  CO2 25 23 27 22   GLUCOSE 105* 105* 131* 116*  BUN 14 15 14 11   CREATININE 0.50* 0.54* 0.60* 0.51*  CALCIUM 8.7* 8.4* 8.4* 8.6*  MG 1.7 1.8  --  1.7  PHOS 2.7  --   --   --    GFR: Estimated Creatinine Clearance: 90.8 mL/min (A) (by C-G formula based on SCr of 0.51 mg/dL (L)).  Liver Function Tests: Recent Labs  Lab 04/22/20 0355 04/23/20 0340 04/25/20 0347 04/26/20 0355  AST 175* 158* 108* 37  ALT 343* 326* 242* 148*  ALKPHOS 88 94 112 100  BILITOT 0.6 0.4 0.8 0.5  PROT 6.0* 6.3* 6.5 6.2*  ALBUMIN 2.2* 2.4* 2.2* 2.0*    Scheduled Meds: . amLODipine  10 mg Oral Daily  . aspirin EC  81 mg  Oral Daily  . enoxaparin (LOVENOX) injection  40 mg Subcutaneous Q24H  . ibuprofen  800 mg Oral TID  . irbesartan  300 mg Oral Daily  . polyethylene glycol  17 g Oral BID  . potassium chloride  40 mEq Oral BID      LOS: 14 days   Cherene Altes, MD [facility] Office  [phone] Pager - Text Page per Amion  If 7PM-7AM, please contact night-coverage per Amion 04/26/2020, 9:11 AM

## 2020-04-26 NOTE — Progress Notes (Signed)
Pt coughing, having trouble it appears clearing mucous.  Put pt on 2l [place], got pt sitting up on side of bed, and set up suction canister for pt to use yaker to help clear the back of his throat.  Requested PRN neb treatment  Tried to instruct pt on tripod and pursed lip breathing but pt not receptive to teaching at this time.

## 2020-04-26 NOTE — Progress Notes (Signed)
Pharmacy Antibiotic Note  Terry Page. is a 65 y.o. male admitted on 04/10/2020. S/p abx for possible PNA/bacteremia. Now restarting abx for pneumonia.  Pharmacy has been consulted for vancomycin and cefepime dosing.  Plan:  Give vancomycin 1.5g IV x 1, then start vancomycin 1g IV Q12h Start cefepime 2g IV Q8h Monitor clinical picture, renal function, vanc levels prn F/U C&S, abx deescalation / LOT  Will also stop scheduled ibuprofen to reduce risk of nephrotoxicity   Height: 5\' 9"  (175.3 cm) Weight: 76.2 kg (168 lb) IBW/kg (Calculated) : 70.7  Temp (24hrs), Avg:98.2 F (36.8 C), Min:98 F (36.7 C), Max:98.3 F (36.8 C)  Recent Labs  Lab 04/21/20 0333 04/22/20 0355 04/23/20 0340 04/25/20 0347 04/26/20 0355  WBC 21.8* 23.1*  --  34.9* 26.8*  CREATININE 0.49* 0.50* 0.54* 0.60* 0.51*    Estimated Creatinine Clearance: 90.8 mL/min (A) (by C-G formula based on SCr of 0.51 mg/dL (L)).    No Known Allergies  Thank you for allowing pharmacy to be a part of this patient's care.  Elenor Quinones, PharmD, BCPS, BCIDP Clinical Pharmacist 04/26/2020 1:05 PM

## 2020-04-26 NOTE — Progress Notes (Signed)
PT Cancellation Note  Patient Details Name: Terry Page. MRN: [id number] DOB: 05/27/1955   Cancelled Treatment:    Reason Eval/Treat Not Completed: Medical issues which prohibited therapy   Claretha Cooper 04/26/2020, 11:37 AM  [place] Pager [phone] Office [phone]

## 2020-04-27 DIAGNOSIS — R1084 Generalized abdominal pain: Secondary | ICD-10-CM | POA: Diagnosis not present

## 2020-04-27 DIAGNOSIS — U071 COVID-19: Secondary | ICD-10-CM | POA: Diagnosis not present

## 2020-04-27 DIAGNOSIS — J1282 Pneumonia due to coronavirus disease 2019: Secondary | ICD-10-CM | POA: Diagnosis not present

## 2020-04-27 DIAGNOSIS — J9601 Acute respiratory failure with hypoxia: Secondary | ICD-10-CM | POA: Diagnosis not present

## 2020-04-27 LAB — COMPREHENSIVE METABOLIC PANEL
ALT: 106 U/L — ABNORMAL HIGH (ref 0–44)
AST: 22 U/L (ref 15–41)
Albumin: 2.2 g/dL — ABNORMAL LOW (ref 3.5–5.0)
Alkaline Phosphatase: 82 U/L (ref 38–126)
Anion gap: 12 (ref 5–15)
BUN: 12 mg/dL (ref 8–23)
CO2: 24 mmol/L (ref 22–32)
Calcium: 8.7 mg/dL — ABNORMAL LOW (ref 8.9–10.3)
Chloride: 97 mmol/L — ABNORMAL LOW (ref 98–111)
Creatinine, Ser: 0.48 mg/dL — ABNORMAL LOW (ref 0.61–1.24)
GFR, Estimated: >60 mL/min (ref >60)
Glucose, Bld: 202 mg/dL — ABNORMAL HIGH (ref 70–99)
Potassium: 3.4 mmol/L — ABNORMAL LOW (ref 3.5–5.1)
Sodium: 133 mmol/L — ABNORMAL LOW (ref 135–145)
Total Bilirubin: 0.6 mg/dL (ref 0.3–1.2)
Total Protein: 6.8 g/dL (ref 6.5–8.1)

## 2020-04-27 LAB — CBC
HCT: 28.6 % — ABNORMAL LOW (ref 39.0–52.0)
Hemoglobin: 9.7 g/dL — ABNORMAL LOW (ref 13.0–17.0)
MCH: 29.2 pg (ref 26.0–34.0)
MCHC: 33.9 g/dL (ref 30.0–36.0)
MCV: 86.1 fL (ref 80.0–100.0)
Platelets: 434 10*3/uL — ABNORMAL HIGH (ref 150–400)
RBC: 3.32 MIL/uL — ABNORMAL LOW (ref 4.22–5.81)
RDW: 14.2 % (ref 11.5–15.5)
WBC: 20.8 10*3/uL — ABNORMAL HIGH (ref 4.0–10.5)
nRBC: 0 % (ref 0.0–0.2)

## 2020-04-27 LAB — CULTURE, BLOOD (ROUTINE X 2): Special Requests: ADEQUATE

## 2020-04-27 LAB — MAGNESIUM: Magnesium: 1.4 mg/dL — ABNORMAL LOW (ref 1.7–2.4)

## 2020-04-27 MED ORDER — POLYETHYLENE GLYCOL 3350 17 G PO PACK: 17.0000 g | PACK | Freq: Every day | ORAL | Status: DC | PRN

## 2020-04-27 MED ORDER — MAGNESIUM SULFATE 4 GM/100ML IV SOLN
4.0000 g | Freq: Once | INTRAVENOUS | Status: AC
  Administered 2020-04-27: 4 g via INTRAVENOUS
  Filled 2020-04-27: qty 100

## 2020-04-27 MED ORDER — IPRATROPIUM-ALBUTEROL 0.5-2.5 (3) MG/3ML IN SOLN
3.0000 mL | Freq: Four times a day (QID) | RESPIRATORY_TRACT | Status: DC
  Administered 2020-04-27 – 2020-04-28 (×2): 3 mL via RESPIRATORY_TRACT
  Filled 2020-04-27 (×2): qty 3

## 2020-04-27 MED ORDER — POTASSIUM CHLORIDE CRYS ER 20 MEQ PO TBCR
40.0000 meq | EXTENDED_RELEASE_TABLET | Freq: Three times a day (TID) | ORAL | Status: AC
  Administered 2020-04-27 – 2020-04-28 (×4): 40 meq via ORAL
  Filled 2020-04-27 (×4): qty 2

## 2020-04-27 MED ORDER — ALBUTEROL SULFATE (2.5 MG/3ML) 0.083% IN NEBU: 2.5000 mg | INHALATION_SOLUTION | RESPIRATORY_TRACT | Status: DC | PRN

## 2020-04-27 MED ORDER — LEVALBUTEROL HCL 0.63 MG/3ML IN NEBU: 0.6300 mg | INHALATION_SOLUTION | Freq: Three times a day (TID) | RESPIRATORY_TRACT | Status: DC

## 2020-04-27 NOTE — Progress Notes (Signed)
Terry Page.  WVP:[id number] DOB: 1955-07-30 DOA: 04/10/2020 PCP: [name], MD    Brief Narrative:  52OE with a history of COPD, spontaneous pneumothorax 2019 and 2021, SIADH, HTN, HLD, prostate cancer, and left lung cancer on XRT who presented to the ED with shortness of breath 04/10/20. He was found to be hypoxic and COVID-positive. CXR noted extensive patchy infiltrates throughout the right upper lobe with a small right pleural effusion.  Date of Positive COVID Test:  04/10/20  Last day of COVID isolation: 04/20/20  Vaccination Status: Unknown  COVID-19 specific Treatment: Remdesivir 4/9 > 4/13 Steroid 4/9 > 4/20  Antimicrobials:  Ceftriaxone 4/9 > 4/13 Azithromycin 4/9 > 4/13 Cefazolin 4/14 > 4/15 Vancomycin 4/11 > 4/13 + 4/25 > Cefepime 4/25 >  DVT prophylaxis: Lovenox  Subjective: The patient had a rough day yesterday with a significant turn for the worst in regard to his respiratory status.  Ultimately it was felt that he was suffering with a nosocomial pneumonia as well as an acute COPD exacerbation.  His vitals have improved today.  He continues to require 3 L nasal cannula oxygen support.  He is afebrile.  His WBC is falling, though this will likely take an upturn due to his use of Solu-Medrol.  He is feeling better overall with less shortness of breath today, though he is not yet back to his baseline respiratory status.[name]  He reports frequent loose bowel movements and asked that we stop his MiraLAX for now.  He denies nausea or vomiting.  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 - experiencing an acute COPD flair today - steroid tx initiated - cont nebs -wheezing not fully resolved but less pronounced today  Prior FUO - Leukocytosis  Sporadic signif fever 4/23 AM - MRI w/o evidence of diskitis - clinically the pt did not appear toxic/appeared well, with no resp distress whatsoever at that time - CT abdomen/liver noted possible diarrheal illness but no small bowel obstruction and no liver lesions suggestive of infection - WBC improving -unclear to me if this was a fever preceding his now apparent pneumonia, or indicative of a symptomatic recurrent bacteremia -see discussion of separate topics  Staph hominis bacteremia 04/10/20 Blood culture positive 2 out of 3 04/10/20 -TTE 4/13 with no evidence of valvular vegetation - ID evaluated - repeat cultures 4/12 with no growth  - repeat blood cultures 4/23 concerning as 1 of 2 sets now appears to be positive again for a gram+ cocci - completed antibiotic therapy for this indication 4/16 - f/u speciation and if is Staph hominis again we may have to investigate the possibility of a persisting bacteremia/SBE   SBO  Noted on CT abdomen/pelvis 4/15 - followed by General Surgery - NG tube required transiently but removed in short course - diet advanced to regular without difficulty - keep potassium at 4.0 and magnesium at 2.0 as able -with the onset of new abdominal symptoms CT abdomen was rechecked and fortunately revealed no evidence of recurrent SBO  Transaminitis Newly appreciated 4/20 - possibly related to COVID/Remdesivir or SBO or both - resolving  Primary cancer left upper lobe lung -  New LLL and R lung nodules  Spiculated mass identified during hospitalization 12/19 - initially managed with XRT - another area of concern developed within the LUL and was confirmed via PET 02/06/2020 w/ pt undergoing further XRT - CT abdom 4/15 raised concern of NEW (since PET) cavitating nodule measuring 1.9 x 1.0 in the LLL as well as a  0.9 x 0.5cm subpleural nodule of the LLL - CT chest 4/9 additionally noted possible nodules in the R lung - discussed findings w/ Rad-Onc who suggest outpt f/u with them shortly after he is stable enough to d/c    Acute on chronic lumbago Has experienced intermittent acute flairs in pain, with some radicular type radiation into the hips/legs - MRI of lumbar spine 4/23 w/o acute findings - attempt to avoid narcotics w/ recent SBO   Hypokalemia Supplement potassium to goal of 4.0  Hypomagnesemia Supplement to goal of 2.0  COVID Pneumonia Completed 5 days of Remdesivir therapy - discontinued steroid dosing 4/20 - clinically resolved - completed 10 full days of isolation as of 4/19PM  Prior Bacterial pneumonia - Sepsis POA completed a full course of azithromycin and ceftriaxone and had experienced full clinical resolution of symptoms  Chronic SIADH Stable sodium   History of prostate cancer Status post XRT  HTN Blood pressure controlled  HLD Holding atorvastatin in setting of transaminitis  Disposition Not yet ready for d/c w/ need to further tx nosocomial PNA - ultimate plan is for SNF rehab stay   Code Status: FULL CODE Family Communication: No family present at time of exam Status is: Inpatient  Remains inpatient appropriate because:Inpatient level of care appropriate due to severity of illness   Dispo: The patient is from: Home              Anticipated d/c is to: SNF              Patient currently is not medically stable to d/c.   Difficult to place patient No  Consultants:  General surgery ID  Objective: Blood pressure 128/82, pulse (!) 105, temperature 98.1 F (36.7 C), temperature source Oral, resp. rate 20, height 5\' 9"  (1.753 m), weight 76.2 kg, SpO2 93 %.  Intake/Output Summary (Last 24 hours) at 04/27/2020 0911 Last data filed at 04/27/2020 0606 Gross per 24 hour  Intake 668.12 ml  Output 800 ml  Net -131.88 ml   Filed Weights   04/10/20 1710 04/10/20  2120  Weight: 76.2 kg 76.2 kg    Examination: General: Respirations more comfortable today -not in extremis Lungs: Mild expiratory wheezing throughout with no focal crackles Cardiovascular: RRR without murmur Abdomen: Protuberant/distended but soft, bowel sounds positive, no rebound, no appreciable mass Extremities: No edema BLE  CBC: Recent Labs  Lab 04/25/20 0347 04/26/20 0355 04/27/20 0404  WBC 34.9* 26.8* 20.8*  HGB 9.3* 9.0* 9.7*  HCT 28.3* 26.2* 28.6*  MCV 89.6 87.9 86.1  PLT 409* 393 417*   Basic Metabolic Panel: Recent Labs  Lab 04/22/20 0355 04/23/20 0340 04/25/20 0347 04/26/20 0355 04/27/20 0404  NA 133* 129* 133* 130* 133*  K 3.4* 4.3 3.4* 3.8 3.4*  CL 99 100 99 99 97*  CO2 25 23 27 22 24   GLUCOSE 105* 105* 131* 116* 202*  BUN 14 15 14 11 12   CREATININE 0.50* 0.54* 0.60* 0.51* 0.48*  CALCIUM 8.7* 8.4* 8.4* 8.6* 8.7*  MG 1.7 1.8  --  1.7 1.4*  PHOS 2.7  --   --   --   --  GFR: Estimated Creatinine Clearance: 90.8 mL/min (A) (by C-G formula based on SCr of 0.48 mg/dL (L)).  Liver Function Tests: Recent Labs  Lab 04/23/20 0340 04/25/20 0347 04/26/20 0355 04/27/20 0404  AST 158* 108* 37 22  ALT 326* 242* 148* 106*  ALKPHOS 94 112 100 82  BILITOT 0.4 0.8 0.5 0.6  PROT 6.3* 6.5 6.2* 6.8  ALBUMIN 2.4* 2.2* 2.0* 2.2*    Scheduled Meds: . amLODipine  10 mg Oral Daily  . aspirin EC  81 mg Oral Daily  . enoxaparin (LOVENOX) injection  40 mg Subcutaneous Q24H  . irbesartan  300 mg Oral Daily  . levalbuterol  0.63 mg Nebulization BID  . methylPREDNISolone (SOLU-MEDROL) injection  40 mg Intravenous Q6H   Followed by  . [START ON 04/28/2020] predniSONE  40 mg Oral Q breakfast  . polyethylene glycol  17 g Oral BID   . ceFEPime (MAXIPIME) IV 2 g (04/27/20 0512)  . vancomycin 1,000 mg (04/27/20 0552)     LOS: 15 days   Cherene Altes, MD [facility] Office  [phone] Pager - Text Page per Amion  If 7PM-7AM, please contact  night-coverage per Amion 04/27/2020, 9:11 AM

## 2020-04-27 NOTE — Progress Notes (Signed)
Physical Therapy Treatment Patient Details Name: Terry Page. MRN: [id number] DOB: January 23, 1955 Today's Date: 04/27/2020    History of Present Illness 65 yo male presents with complaints of shortness of breath. Pt tested positive for COVID 04/10/20.   on 4/15 pt developed nausea and vomiting and abdominal distention.  A CT scan was ordered.  This shows a small bowel obstruction with transition point in the distal small bowel  [id number] with both emphysema, spontaneous pneumothorax (2019 and 2021), SIADH, HTN, hyperlipidemia, prostate cancer and left lung cancer (currently receiving radiation therapy).    PT Comments    Pt oob in recliner on 2 lts nasal sats at rest 93% with HR 102.  Assisted with amb in hallway was limited.  General Gait Details: decreased amb distance due to increased c/o weakness, fatihue and dyspnea.  Trial amb on RA sats decreased to 86% with HR increased to 130's.  Dyspnea 3/4.  Deconditioned.  Slow/sluggish gait.  HIGH FALL RISK.  Positioned in recliner and instructed on purse lip breathing.    SATURATION QUALIFICATIONS: (This note is used to comply with regulatory documentation for home oxygen)  Patient Saturations on Room Air at Rest = 89%  Patient Saturations on Room Air while Ambulating 45 feet = 86%  Patient Saturations on 2 Liters of oxygen while Ambulating = 90%  Please briefly explain why patient needs home oxygen:  Pt requires supplemental oxygen to achieve therapeutic levels    Follow Up Recommendations  SNF     Equipment Recommendations  Rolling walker with 5" wheels    Recommendations for Other Services       Precautions / Restrictions Precautions Precautions: Fall Precaution Comments: O2 and HR stable    Mobility  Bed Mobility               General bed mobility comments: OOB in recliner    Transfers Overall transfer level: Needs assistance Equipment used: Rolling walker (2 wheeled) Transfers: Sit to/from Stand Sit to Stand:  Supervision Stand pivot transfers: Supervision;Min guard       General transfer comment: extra time to rise, use of RW   weak   unsteady  Ambulation/Gait Ambulation/Gait assistance: Min guard Gait Distance (Feet): 45 Feet Assistive device: Rolling walker (2 wheeled) Gait Pattern/deviations: Step-through pattern Gait velocity: decr   General Gait Details: decreased amb distance due to increased c/o weakness, fatihue and dyspnea.  Trial amb on RA sats decreased to 86% with HR increased to 130's.  Deconditioned.  Slow/sluggish gait.  HIGH FALL RISK.   Stairs             Wheelchair Mobility    Modified Rankin (Stroke Patients Only)       Balance                                            Cognition Arousal/Alertness: Awake/alert Behavior During Therapy: Flat affect Overall Cognitive Status: Within Functional Limits for tasks assessed                                 General Comments: AxO x 3 very pleasant but feeling poorly      Exercises      General Comments        Pertinent Vitals/Pain Pain Assessment: Faces Faces Pain Scale: Hurts a little bit  Pain Location: abd Pain Descriptors / Indicators: Discomfort Pain Intervention(s): Monitored during session    Home Living                      Prior Function            PT Goals (current goals can now be found in the care plan section) Progress towards PT goals: Progressing toward goals    Frequency    Min 3X/week      PT Plan Current plan remains appropriate    Co-evaluation              AM-PAC PT "6 Clicks" Mobility   Outcome Measure  Help needed turning from your back to your side while in a flat bed without using bedrails?: A Little Help needed moving from lying on your back to sitting on the side of a flat bed without using bedrails?: A Little Help needed moving to and from a bed to a chair (including a wheelchair)?: A Little Help needed  standing up from a chair using your arms (e.g., wheelchair or bedside chair)?: A Little Help needed to walk in hospital room?: A Little Help needed climbing 3-5 steps with a railing? : A Lot 6 Click Score: 17    End of Session Equipment Utilized During Treatment: Gait belt Activity Tolerance: Patient limited by fatigue Patient left: in chair;with call bell/phone within reach Nurse Communication: Mobility status PT Visit Diagnosis: Other abnormalities of gait and mobility (R26.89);Unsteadiness on feet (R26.81)     Time: 2248-2500 PT Time Calculation (min) (ACUTE ONLY): 25 min  Charges:  $Gait Training: 8-22 mins $Therapeutic Activity: 8-22 mins                     Rica Koyanagi  PTA Acute  Rehabilitation Services Pager      [phone] Office      [phone]

## 2020-04-27 NOTE — Progress Notes (Signed)
OT Cancellation Note  Patient Details Name: Terry Page. MRN: [id number] DOB: 02-04-1955   Cancelled Treatment:    Reason Eval/Treat Not Completed: Patient declined, no reason specified: Pt declined despite encouragement. Reason unclear except pt reported "I have a lot of medication." Pt seemed mildly confused, but adamant re: no therapy.  Will continue efforts.   Julien Girt 04/27/2020, 12:51 PM

## 2020-04-28 ENCOUNTER — Inpatient Hospital Stay (HOSPITAL_COMMUNITY): Payer: Medicare HMO

## 2020-04-28 DIAGNOSIS — J189 Pneumonia, unspecified organism: Secondary | ICD-10-CM | POA: Diagnosis not present

## 2020-04-28 DIAGNOSIS — E222 Syndrome of inappropriate secretion of antidiuretic hormone: Secondary | ICD-10-CM | POA: Diagnosis not present

## 2020-04-28 DIAGNOSIS — J9601 Acute respiratory failure with hypoxia: Secondary | ICD-10-CM | POA: Diagnosis not present

## 2020-04-28 DIAGNOSIS — J441 Chronic obstructive pulmonary disease with (acute) exacerbation: Secondary | ICD-10-CM | POA: Diagnosis not present

## 2020-04-28 LAB — BASIC METABOLIC PANEL
Anion gap: 10 (ref 5–15)
BUN: 22 mg/dL (ref 8–23)
CO2: 22 mmol/L (ref 22–32)
Calcium: 8.7 mg/dL — ABNORMAL LOW (ref 8.9–10.3)
Chloride: 99 mmol/L (ref 98–111)
Creatinine, Ser: 0.56 mg/dL — ABNORMAL LOW (ref 0.61–1.24)
GFR, Estimated: >60 mL/min (ref >60)
Glucose, Bld: 143 mg/dL — ABNORMAL HIGH (ref 70–99)
Potassium: 4.3 mmol/L (ref 3.5–5.1)
Sodium: 131 mmol/L — ABNORMAL LOW (ref 135–145)

## 2020-04-28 LAB — CBC
HCT: 27.9 % — ABNORMAL LOW (ref 39.0–52.0)
Hemoglobin: 9.4 g/dL — ABNORMAL LOW (ref 13.0–17.0)
MCH: 29.4 pg (ref 26.0–34.0)
MCHC: 33.7 g/dL (ref 30.0–36.0)
MCV: 87.2 fL (ref 80.0–100.0)
Platelets: 424 10*3/uL — ABNORMAL HIGH (ref 150–400)
RBC: 3.2 MIL/uL — ABNORMAL LOW (ref 4.22–5.81)
RDW: 14.5 % (ref 11.5–15.5)
WBC: 20.5 10*3/uL — ABNORMAL HIGH (ref 4.0–10.5)
nRBC: 0 % (ref 0.0–0.2)

## 2020-04-28 LAB — MAGNESIUM: Magnesium: 2.2 mg/dL (ref 1.7–2.4)

## 2020-04-28 MED ORDER — MORPHINE SULFATE (PF) 4 MG/ML IV SOLN
4.0000 mg | Freq: Once | INTRAVENOUS | Status: AC | PRN
  Administered 2020-04-28: 4 mg via INTRAVENOUS
  Filled 2020-04-28: qty 1

## 2020-04-28 MED ORDER — IPRATROPIUM-ALBUTEROL 0.5-2.5 (3) MG/3ML IN SOLN
3.0000 mL | Freq: Three times a day (TID) | RESPIRATORY_TRACT | Status: DC
  Administered 2020-04-28 – 2020-05-01 (×9): 3 mL via RESPIRATORY_TRACT
  Filled 2020-04-28 (×9): qty 3

## 2020-04-28 NOTE — Progress Notes (Signed)
Cross-coverage note:   Patient complaining of severe abdominal pain, waxing and waning. He had negative KUB this afternoon, has been tolerating diet, and had BM today per RN. Plan to treat pain, continue monitoring.

## 2020-04-28 NOTE — Progress Notes (Signed)
OT Cancellation Note  Patient Details Name: Terry Page. MRN: [id number] DOB: 12/25/1955   Cancelled Treatment:    Reason Eval/Treat Not Completed: Fatigue/lethargy limiting ability to participate. Reports he is too "wiped out" to work with therapy today. Will f/u as able.  Jadelyn Elks L Kalila Adkison 04/28/2020, 3:06 PM

## 2020-04-28 NOTE — Progress Notes (Signed)
PROGRESS NOTE  [name]. WUJ:[id number] DOB: [birth year] DOA: 04/10/2020 PCP: [name], MD  Brief History   65 year old man PMH COPD, SIADH, prostate cancer, left lung cancer on XRT presented with shortness of breath 4/9.  Admitted for acute hypoxic respiratory failure secondary to right upper lobe bacterial pneumonia and concurrent COVID-19 infection.  Subsequently found to have staph hominis bacteremia.  Successfully treated and recovered, isolation removed.  However developed small bowel obstruction which resolved spontaneously, followed by general surgery.  Subsequently developed COPD exacerbation and healthcare associated pneumonia.  A & P  Acute hypoxic respiratory failure secondary to COPD exacerbation hospital-acquired pneumonia started on antibiotics 4/25. --Appears weak, short of breath, fatigue.  Continue empiric antibiotics  Acute COPD exacerbation, chronic bullous emphysema, PMH 2 spontaneous pneumothoraces --Fair air movement.  Continue antibiotics and steroids.  Staph hominis bacteremia 4/9, unclear source, lacks indwelling hardware, lines, cardiac devices. TTE no evidence of valvular vegetation.  Seen by ID, completed cefazolin total 7 days from complicated coag negative staph bacteremia.  1/2 staph mitis contaminant  SBO --Seen 4/15 and CT abdomen pelvis, followed by general surgery, resolved spontaneously  COVID-pneumonia treated with remdesivir and steroids, completed isolation 4/19  Sepsis present on admission, bacterial pneumonia, treated with azithromycin and ceftriaxone  Primary cancer left upper lobe lung treated with XRT.  CT abdomen pelvis 4/15 raise concern new cavitating nodule left lower lobe as well as subpleural nodule left lower lobe.  Possible additional nodules right lung. --Close outpatient follow-up with radiation oncology after discharge  Acute on chronic lumbar ago with intermittent acute flares and back pain with radicular symptoms.   MRI lumbar spine 4/23 without acute findings.  Chronic SIADH --stable  Prostate cancer status post XRT Aortic atherosclerosis  Disposition Plan:  Discussion: Continue antibiotics, steroids, bronchodilators  Status is: Inpatient  Remains inpatient appropriate because:IV treatments appropriate due to intensity of illness or inability to take PO and Inpatient level of care appropriate due to severity of illness   Dispo:  Patient From: Home  Planned Disposition: [place]  Medically stable for discharge: No     DVT prophylaxis: enoxaparin (LOVENOX) injection 40 mg Start: 04/10/20 2200   Code Status: Full Code Level of care: Progressive Family Communication: none  Murray Hodgkins, MD  [facility] Direct contact: see [email or web address] (further directions at bottom of note if needed) 7PM-7AM contact night coverage as at bottom of note 04/28/2020, 4:16 PM  LOS: 16 days   Significant Hospital Events   . 4/9 admit for pneumonia . 4/16 SBO   Consults:  . ID . General surgery   Procedures:  .   Significant Diagnostic Tests:  [name]    Micro Data:  .    Antimicrobials:  Ceftriaxone 4/9 > 4/13 Azithromycin 4/9 > 4/13 Cefazolin 4/14 > 4/15 Vancomycin 4/11 > 4/13 + 4/25 > Cefepime 4/25 >  Interval History/Subjective  CC: f/u SOB  Feels SOB, exhausted, not back to baseline No n/v today, mild lower abd pain, had a normal BM today  Objective   Vitals:  Vitals:   04/28/20 1335 04/28/20 1408  BP: 110/84   Pulse: 98   Resp: (!) 24   Temp: 98 F (36.7 C)   SpO2: 98% 97%    Exam:  Constitutional:   . Appears calm and comfortable, tired, ill but not toxic ENMT:  . grossly normal hearing  . Lips appear normal Respiratory:  . CTA bilaterally, no w/r/r.  . Respiratory effort mildly increased Cardiovascular:  .  RRR, no m/r/g . No LE extremity edema   Abdomen:  . Distended, soft, nd, decreased BS, no hernia seen Psychiatric:  . Mental  status o Mood, affect appropriate . judgment and insight appear intact   I have personally reviewed the following:   Today's Data  . Na+ stable 131 . Mg WNL . K+ at goal . WBC 20.5 w/o change . Hgb stable 9.4  Scheduled Meds: . amLODipine  10 mg Oral Daily  . aspirin EC  81 mg Oral Daily  . enoxaparin (LOVENOX) injection  40 mg Subcutaneous Q24H  . ipratropium-albuterol  3 mL Nebulization TID  . irbesartan  300 mg Oral Daily  . predniSONE  40 mg Oral Q breakfast   Continuous Infusions: . ceFEPime (MAXIPIME) IV 2 g (04/28/20 0516)  . vancomycin 1,000 mg (04/28/20 0556)    Principal Problem:   Acute respiratory failure with hypoxia ([name]) Active Problems:   COPD with acute exacerbation ([place])   COVID-19 virus infection   Primary cancer of left upper lobe of lung ([place])   Pneumonia of right upper lobe due to infectious organism   SIADH (syndrome of inappropriate ADH production) ([place])   Essential hypertension   Mixed hyperlipidemia   Sepsis ([place])   LOS: 16 days   How to contact the [facility] Attending or Consulting provider [place] or covering provider during after hours [place], for this patient?  1. Check the care team in [facility] and look for a) attending/consulting TR[facility] provider listed and b) the [facility] team listed 2. Log into [email or web address] and use [place]'s universal password to access. If you do not have the password, please contact the hospital operator. 3. Locate the [facility] provider you are looking for under [facility] and page to a number that you can be directly reached. 4. If you still have difficulty reaching the provider, please page the [facility] (Director on Call) for the Hospitalists listed on amion for assistance.

## 2020-04-28 NOTE — Plan of Care (Signed)
  Problem: Health Behavior/Discharge Planning: Goal: Ability to manage health-related needs will improve Outcome: Progressing   Problem: Clinical Measurements: Goal: Ability to maintain clinical measurements within normal limits will improve Outcome: Progressing Goal: Will remain free from infection Outcome: Progressing Goal: Diagnostic test results will improve Outcome: Progressing Goal: Respiratory complications will improve Outcome: Progressing Goal: Cardiovascular complication will be avoided Outcome: Progressing   Problem: Activity: Goal: Risk for activity intolerance will decrease Outcome: Progressing   Problem: Nutrition: Goal: Adequate nutrition will be maintained Outcome: Progressing   Problem: Coping: Goal: Level of anxiety will decrease Outcome: Progressing   Problem: Elimination: Goal: Will not experience complications related to urinary retention Outcome: Progressing   Problem: Pain Managment: Goal: General experience of comfort will improve Outcome: Progressing   Problem: Safety: Goal: Ability to remain free from injury will improve Outcome: Progressing   Problem: Activity: Goal: Ability to tolerate increased activity will improve Outcome: Progressing   Problem: Respiratory: Goal: Ability to maintain adequate ventilation will improve Outcome: Progressing Goal: Ability to maintain a clear airway will improve Outcome: Progressing   Problem: Coping: Goal: Psychosocial and spiritual needs will be supported Outcome: Progressing   Problem: Respiratory: Goal: Complications related to the disease process, condition or treatment will be avoided or minimized Outcome: Progressing

## 2020-04-28 NOTE — Hospital Course (Addendum)
65 year old man PMH COPD, SIADH, prostate cancer, left lung cancer on XRT presented with shortness of breath 4/9.  Admitted for acute hypoxic respiratory failure secondary to right upper lobe bacterial pneumonia and concurrent COVID-19 infection.  Subsequently found to have staph hominis bacteremia.  Successfully treated and recovered, isolation removed.  Then developed small bowel obstruction which resolved spontaneously, followed by general surgery.  Subsequently developed COPD exacerbation and healthcare associated pneumonia.  Failed to improve, CT chest showed infected bulla, multilobar pneumonia.  Condition worsened, required BiPAP, transferred to stepdown.  Seen by infectious disease and pulmonology.  Transitioned to oral antibiotics.  Subsequently noted to have choking episodes with food.  Seen by speech therapy and diet advanced.  Now improving, transferred to progressive unit.  If continues to improve and oxygen is stable, can likely discharge in 48 hours to SNF.  A & P  Acute hypoxic respiratory failure secondary to multilobar pneumonia, infected bulla, necrotizing pneumonia. -- Clinically much better.  Down to 2-3 L.  Will require oxygen on discharge based on ambulatory monitoring. --Afebrile.  Continue Augmentin per infectious disease. --Follow-up with pulmonology  Wednesday 5/18 at 10:30AM with Rexene Edison, NP.  Dysphagia.   -- Continue diet as per speech therapy  Acute COPD exacerbation, chronic bullous emphysema, PMH 2 spontaneous pneumothoraces -- Exacerbation resolved.  Steroid taper as per pulmonology.  Primary cancer left upper lobe lung treated with XRT.  CT abdomen pelvis 4/15 raise concern new cavitating nodule left lower lobe as well as subpleural nodule left lower lobe.  Possible additional nodules right lung. --Close outpatient follow-up with radiation oncology after discharge  Chronic leukocytosis --Etiology unclear, seen June and May 2021 as well as during this  hospitalization.  Follow-up as an outpatient.  SBO, resolved --Seen 4/15 and CT abdomen pelvis, followed by general surgery, resolved spontaneously  COVID-pneumonia treated with remdesivir and steroids, completed isolation 4/19   Sepsis present on admission, bacterial pneumonia, treated with azithromycin and ceftriaxone  Chronic SIADH -- Mild.  Stable  Anemia of chronic disease, stable.  Prostate cancer status post XRT Aortic atherosclerosis --no tx indicated  Staph hominis bacteremia 4/9, unclear source, lacks indwelling hardware, lines, cardiac devices. TTE no evidence of valvular vegetation.  Seen by ID, completed cefazolin total 7 days from complicated coag negative staph bacteremia.  1/2 staph mitis contaminant --No further evaluation suggested

## 2020-04-29 DIAGNOSIS — E222 Syndrome of inappropriate secretion of antidiuretic hormone: Secondary | ICD-10-CM | POA: Diagnosis not present

## 2020-04-29 DIAGNOSIS — J441 Chronic obstructive pulmonary disease with (acute) exacerbation: Secondary | ICD-10-CM | POA: Diagnosis not present

## 2020-04-29 DIAGNOSIS — J9601 Acute respiratory failure with hypoxia: Secondary | ICD-10-CM | POA: Diagnosis not present

## 2020-04-29 DIAGNOSIS — J189 Pneumonia, unspecified organism: Secondary | ICD-10-CM | POA: Diagnosis not present

## 2020-04-29 DIAGNOSIS — D72829 Elevated white blood cell count, unspecified: Secondary | ICD-10-CM

## 2020-04-29 LAB — MRSA PCR SCREENING: MRSA by PCR: NEGATIVE

## 2020-04-29 LAB — CULTURE, BLOOD (ROUTINE X 2)
Culture: NO GROWTH
Special Requests: ADEQUATE

## 2020-04-29 MED ORDER — GUAIFENESIN ER 600 MG PO TB12
600.0000 mg | ORAL_TABLET | Freq: Two times a day (BID) | ORAL | Status: DC
  Administered 2020-04-29 – 2020-05-10 (×23): 600 mg via ORAL
  Filled 2020-04-29 (×23): qty 1

## 2020-04-29 MED ORDER — POLYETHYLENE GLYCOL 3350 17 G PO PACK
17.0000 g | PACK | Freq: Two times a day (BID) | ORAL | Status: DC
  Administered 2020-04-29 – 2020-05-08 (×6): 17 g via ORAL
  Filled 2020-04-29 (×9): qty 1

## 2020-04-29 MED ORDER — MELATONIN 3 MG PO TABS
3.0000 mg | ORAL_TABLET | Freq: Every evening | ORAL | Status: DC | PRN
  Administered 2020-04-29 – 2020-05-03 (×2): 3 mg via ORAL
  Filled 2020-04-29 (×3): qty 1

## 2020-04-29 MED ORDER — SENNA 8.6 MG PO TABS
1.0000 | ORAL_TABLET | Freq: Every day | ORAL | Status: DC
  Administered 2020-04-29 – 2020-05-08 (×6): 8.6 mg via ORAL
  Filled 2020-04-29 (×7): qty 1

## 2020-04-29 NOTE — Progress Notes (Signed)
PROGRESS NOTE  Terry Page. JOI:[id number] DOB: 1955/05/22 DOA: 04/10/2020 PCP: [name], MD  Brief History   65 year old man PMH COPD, SIADH, prostate cancer, left lung cancer on XRT presented with shortness of breath 4/9.  Admitted for acute hypoxic respiratory failure secondary to right upper lobe bacterial pneumonia and concurrent COVID-19 infection.  Subsequently found to have staph hominis bacteremia.  Successfully treated and recovered, isolation removed.  However developed small bowel obstruction which resolved spontaneously, followed by general surgery.  Subsequently developed COPD exacerbation and healthcare associated pneumonia.  A & P  Acute hypoxic respiratory failure secondary to COPD exacerbation hospital-acquired pneumonia started on antibiotics 4/25. -- Remains on 1 L, remains short of breath.  Continue antibiotics and steroids.  Continue bronchodilators.  Acute COPD exacerbation, chronic bullous emphysema, PMH 2 spontaneous pneumothoraces -- Fair air movement.  Plan as above.  Chronic leukocytosis --Etiology unclear, seen June and May 2021 as well as throughout this hospitalization. --Needs outpatient follow-up and repeat CBC to ensure resolution.  Differential here unrevealing.  SBO, resolved --Seen 4/15 and CT abdomen pelvis, followed by general surgery, resolved spontaneously  Abdominal pain.  Etiology unclear.  Exam is benign.  Bowels moving, passing gas. --Monitor clinically.  COVID-pneumonia treated with remdesivir and steroids, completed isolation 4/19  Sepsis present on admission, bacterial pneumonia, treated with azithromycin and ceftriaxone  Primary cancer left upper lobe lung treated with XRT.  CT abdomen pelvis 4/15 raise concern new cavitating nodule left lower lobe as well as subpleural nodule left lower lobe.  Possible additional nodules right lung. --Close outpatient follow-up with radiation oncology after discharge  Acute on chronic lumbar  ago with intermittent acute flares and back pain with radicular symptoms.  MRI lumbar spine 4/23 without acute findings.  Chronic SIADH --stable  Prostate cancer status post XRT Aortic atherosclerosis  Staph hominis bacteremia 4/9, unclear source, lacks indwelling hardware, lines, cardiac devices. TTE no evidence of valvular vegetation.  Seen by ID, completed cefazolin total 7 days from complicated coag negative staph bacteremia.  1/2 staph mitis contaminant --No further evaluation suggested  Disposition Plan:  Discussion: Continue antibiotics, steroids, bronchodilators  Status is: Inpatient  Remains inpatient appropriate because:IV treatments appropriate due to intensity of illness or inability to take PO and Inpatient level of care appropriate due to severity of illness   Dispo:  Patient From: Home  Planned Disposition: [place]  Medically stable for discharge: No     DVT prophylaxis: enoxaparin (LOVENOX) injection 40 mg Start: 04/10/20 2200   Code Status: Full Code Level of care: Progressive Family Communication: none  Murray Hodgkins, MD  [facility] Direct contact: see [email or web address] (further directions at bottom of note if needed) 7PM-7AM contact night coverage as at bottom of note 04/29/2020, 7:40 PM  LOS: 17 days   Significant Hospital Events   . 4/9 admit for pneumonia . 4/16 SBO   Consults:  . ID . General surgery   Procedures:  .   Significant Diagnostic Tests:  [name]    Micro Data:  .    Antimicrobials:  Ceftriaxone 4/9 > 4/13 Azithromycin 4/9 > 4/13 Cefazolin 4/14 > 4/15 Vancomycin 4/11 > 4/13 + 4/25 > Cefepime 4/25 >  Interval History/Subjective  CC: f/u SOB  "Not a good day today".  Remains short of breath.  Had some abdominal pain overnight, reports some today.  Bowels moving normally, last today.  Tolerating diet.  No nausea or vomiting.  Objective   Vitals:  Vitals:   04/29/20  1604 04/29/20 1800  BP: (!) 149/84    Pulse: (!) 114 100  Resp: (!) 22 20  Temp: 98.5 F (36.9 C)   SpO2: 93% 93%    Exam:  Constitutional:   . Appears calm but ill, uncomfortable but not toxic.  Sitting in chair. ENMT:  . grossly normal hearing  Respiratory:  . CTA bilaterally, no w/r/r.  . Respiratory effort mildly increased, coughs frequently, speaks in short sentences.,  Dipstick. Cardiovascular:  . RRR, no m/r/g . No LE extremity edema   Abdomen:  . Distended but nontender, decreased bowel sounds. Psychiatric:  . Mental status o Mood, affect appropriate  I have personally reviewed the following:   Today's Data  . None today  Scheduled Meds: . amLODipine  10 mg Oral Daily  . aspirin EC  81 mg Oral Daily  . enoxaparin (LOVENOX) injection  40 mg Subcutaneous Q24H  . guaiFENesin  600 mg Oral BID  . ipratropium-albuterol  3 mL Nebulization TID  . irbesartan  300 mg Oral Daily  . polyethylene glycol  17 g Oral BID  . predniSONE  40 mg Oral Q breakfast  . senna  1 tablet Oral QHS   Continuous Infusions: . ceFEPime (MAXIPIME) IV 2 g (04/29/20 1459)  . vancomycin 1,000 mg (04/29/20 1832)    Principal Problem:   Acute respiratory failure with hypoxia ([name]) Active Problems:   COPD with acute exacerbation ([place])   COVID-19 virus infection   Primary cancer of left upper lobe of lung ([name])   Pneumonia of right upper lobe due to infectious organism   SIADH (syndrome of inappropriate ADH production) ([place])   Essential hypertension   Mixed hyperlipidemia   Sepsis ([place])   LOS: 17 days   How to contact the [facility] Attending or Consulting provider [place] or covering provider during after hours [place], for this patient?  1. Check the care team in [facility] and look for a) attending/consulting TRH provider listed and b) the [facility] team listed 2. Log into [email or web address] and use [place]'s universal password to access. If you do not have the password, please contact the hospital operator. 3. Locate the [facility] provider you  are looking for under [facility] and page to a number that you can be directly reached. 4. If you still have difficulty reaching the provider, please page the [facility] (Director on Call) for the Hospitalists listed on amion for assistance.

## 2020-04-29 NOTE — Plan of Care (Signed)
  Problem: Clinical Measurements: Goal: Will remain free from infection Outcome: Progressing Goal: Diagnostic test results will improve Outcome: Progressing   Problem: Activity: Goal: Risk for activity intolerance will decrease Outcome: Progressing   

## 2020-04-29 NOTE — Progress Notes (Signed)
OT Cancellation Note  Patient Details Name: Terry Page. MRN: [id number] DOB: 1955-02-27   Cancelled Treatment:    Reason Eval/Treat Not Completed: Patient declined, no reason specified. Patient refuses therapy again today stating he "got an injection last night. I'm waiting on the doctor for instructions." Typically therapy signs off if patient consistently refuses. May need encouragement from MD and nursing for improved participation.  Lasondra Hodgkins L Ita Fritzsche 04/29/2020, 10:30 AM

## 2020-04-29 NOTE — Progress Notes (Signed)
Shift Summary:   Remained alert and oriented. Refused therapy during this shift. Scheduled guaifenesin added to regimen for cough. Sat up in the chair for a portion of the shift. Remains on 1L of nasal cannula. Encouraged to use incentive spirometry.Patient refused. Poor appetite during this shift.   No other needs identified.Will continue to monitor.

## 2020-04-29 NOTE — Plan of Care (Signed)
  Problem: Health Behavior/Discharge Planning: Goal: Ability to manage health-related needs will improve Outcome: Progressing   Problem: Clinical Measurements: Goal: Ability to maintain clinical measurements within normal limits will improve Outcome: Progressing Goal: Will remain free from infection Outcome: Progressing Goal: Diagnostic test results will improve Outcome: Progressing Goal: Respiratory complications will improve Outcome: Progressing Goal: Cardiovascular complication will be avoided Outcome: Progressing   Problem: Activity: Goal: Risk for activity intolerance will decrease Outcome: Progressing   Problem: Nutrition: Goal: Adequate nutrition will be maintained Outcome: Progressing   Problem: Coping: Goal: Level of anxiety will decrease Outcome: Progressing   Problem: Elimination: Goal: Will not experience complications related to urinary retention Outcome: Progressing   Problem: Pain Managment: Goal: General experience of comfort will improve Outcome: Progressing   Problem: Safety: Goal: Ability to remain free from injury will improve Outcome: Progressing   Problem: Activity: Goal: Ability to tolerate increased activity will improve Outcome: Progressing   Problem: Respiratory: Goal: Ability to maintain adequate ventilation will improve Outcome: Progressing Goal: Ability to maintain a clear airway will improve Outcome: Progressing   Problem: Coping: Goal: Psychosocial and spiritual needs will be supported Outcome: Progressing   Problem: Respiratory: Goal: Complications related to the disease process, condition or treatment will be avoided or minimized Outcome: Progressing

## 2020-04-30 ENCOUNTER — Inpatient Hospital Stay (HOSPITAL_COMMUNITY): Payer: Medicare HMO

## 2020-04-30 DIAGNOSIS — J984 Other disorders of lung: Secondary | ICD-10-CM | POA: Diagnosis not present

## 2020-04-30 DIAGNOSIS — J441 Chronic obstructive pulmonary disease with (acute) exacerbation: Secondary | ICD-10-CM | POA: Diagnosis not present

## 2020-04-30 DIAGNOSIS — J9601 Acute respiratory failure with hypoxia: Secondary | ICD-10-CM | POA: Diagnosis not present

## 2020-04-30 DIAGNOSIS — J189 Pneumonia, unspecified organism: Secondary | ICD-10-CM | POA: Diagnosis not present

## 2020-04-30 LAB — CBC WITH DIFFERENTIAL/PLATELET
Abs Immature Granulocytes: 0.08 10*3/uL — ABNORMAL HIGH (ref 0.00–0.07)
Basophils Absolute: 0 10*3/uL (ref 0.0–0.1)
Basophils Relative: 0 %
Eosinophils Absolute: 0 10*3/uL (ref 0.0–0.5)
Eosinophils Relative: 0 %
HCT: 27.6 % — ABNORMAL LOW (ref 39.0–52.0)
Hemoglobin: 9.4 g/dL — ABNORMAL LOW (ref 13.0–17.0)
Immature Granulocytes: 1 %
Lymphocytes Relative: 5 %
Lymphs Abs: 0.5 10*3/uL — ABNORMAL LOW (ref 0.7–4.0)
MCH: 29.2 pg (ref 26.0–34.0)
MCHC: 34.1 g/dL (ref 30.0–36.0)
MCV: 85.7 fL (ref 80.0–100.0)
Monocytes Absolute: 1 10*3/uL (ref 0.1–1.0)
Monocytes Relative: 9 %
Neutro Abs: 9.7 10*3/uL — ABNORMAL HIGH (ref 1.7–7.7)
Neutrophils Relative %: 85 %
Platelets: 377 10*3/uL (ref 150–400)
RBC: 3.22 MIL/uL — ABNORMAL LOW (ref 4.22–5.81)
RDW: 14.6 % (ref 11.5–15.5)
WBC: 11.4 10*3/uL — ABNORMAL HIGH (ref 4.0–10.5)
nRBC: 0 % (ref 0.0–0.2)

## 2020-04-30 LAB — BASIC METABOLIC PANEL
Anion gap: 8 (ref 5–15)
BUN: 15 mg/dL (ref 8–23)
CO2: 23 mmol/L (ref 22–32)
Calcium: 9 mg/dL (ref 8.9–10.3)
Chloride: 99 mmol/L (ref 98–111)
Creatinine, Ser: 0.43 mg/dL — ABNORMAL LOW (ref 0.61–1.24)
GFR, Estimated: >60 mL/min (ref >60)
Glucose, Bld: 102 mg/dL — ABNORMAL HIGH (ref 70–99)
Potassium: 3.7 mmol/L (ref 3.5–5.1)
Sodium: 130 mmol/L — ABNORMAL LOW (ref 135–145)

## 2020-04-30 MED ORDER — SODIUM CHLORIDE 0.9 % IV SOLN
3.0000 g | Freq: Four times a day (QID) | INTRAVENOUS | Status: DC
  Administered 2020-04-30 – 2020-05-01 (×4): 3 g via INTRAVENOUS
  Filled 2020-04-30: qty 3
  Filled 2020-04-30 (×2): qty 8
  Filled 2020-04-30 (×2): qty 3
  Filled 2020-04-30: qty 8

## 2020-04-30 MED ORDER — IOHEXOL 300 MG/ML  SOLN
75.0000 mL | Freq: Once | INTRAMUSCULAR | Status: AC | PRN
  Administered 2020-04-30: 75 mL via INTRAVENOUS

## 2020-04-30 NOTE — Progress Notes (Signed)
Pharmacy Antibiotic Note  Terry Page. is a 65 y.o. male admitted on 04/10/2020 with cavitary PNA.  Pharmacy has been consulted for Unasyn dosing.  Plan: Unasyn 3g IV q6 per current renal function Will sign off  Height: 5\' 9"  (175.3 cm) Weight: 76.2 kg (168 lb) IBW/kg (Calculated) : 70.7  Temp (24hrs), Avg:98.5 F (36.9 C), Min:98.4 F (36.9 C), Max:98.5 F (36.9 C)  Recent Labs  Lab 04/25/20 0347 04/26/20 0355 04/27/20 0404 04/28/20 0337 04/30/20 0351  WBC 34.9* 26.8* 20.8* 20.5* 11.4*  CREATININE 0.60* 0.51* 0.48* 0.56* 0.43*    Estimated Creatinine Clearance: 90.8 mL/min (A) (by C-G formula based on SCr of 0.43 mg/dL (L)).    No Known Allergies    Thank you for allowing pharmacy to be a part of this patient's care.  Kara Mead 04/30/2020 3:54 PM

## 2020-04-30 NOTE — Progress Notes (Signed)
PT Cancellation Note  Patient Details Name: Terry Page. MRN: [id number] DOB: 12/15/1955   Cancelled Treatment:     pt with increased c/o dyspnea "not a good day".  Pt declined any encouragement to get OOB.   Can check back tomorrow.   Pt has been evaluated with rec for SNF.   Nathanial Rancher 04/30/2020, 12:49 PM

## 2020-04-30 NOTE — Plan of Care (Signed)
  Problem: Health Behavior/Discharge Planning: Goal: Ability to manage health-related needs will improve Outcome: Progressing   Problem: Clinical Measurements: Goal: Ability to maintain clinical measurements within normal limits will improve Outcome: Progressing Goal: Will remain free from infection Outcome: Progressing Goal: Diagnostic test results will improve Outcome: Progressing Goal: Respiratory complications will improve Outcome: Progressing Goal: Cardiovascular complication will be avoided Outcome: Progressing   Problem: Activity: Goal: Risk for activity intolerance will decrease Outcome: Progressing   Problem: Nutrition: Goal: Adequate nutrition will be maintained Outcome: Progressing   Problem: Coping: Goal: Level of anxiety will decrease Outcome: Progressing   Problem: Elimination: Goal: Will not experience complications related to urinary retention Outcome: Progressing   Problem: Pain Managment: Goal: General experience of comfort will improve Outcome: Progressing   Problem: Safety: Goal: Ability to remain free from injury will improve Outcome: Progressing   Problem: Activity: Goal: Ability to tolerate increased activity will improve Outcome: Progressing   Problem: Respiratory: Goal: Ability to maintain adequate ventilation will improve Outcome: Progressing Goal: Ability to maintain a clear airway will improve Outcome: Progressing   Problem: Coping: Goal: Psychosocial and spiritual needs will be supported Outcome: Progressing   Problem: Respiratory: Goal: Complications related to the disease process, condition or treatment will be avoided or minimized Outcome: Progressing   Problem: Health Behavior/Discharge Planning: Goal: Ability to manage health-related needs will improve Outcome: Progressing   Problem: Clinical Measurements: Goal: Ability to maintain clinical measurements within normal limits will improve Outcome: Progressing Goal: Will  remain free from infection Outcome: Progressing Goal: Diagnostic test results will improve Outcome: Progressing Goal: Respiratory complications will improve Outcome: Progressing Goal: Cardiovascular complication will be avoided Outcome: Progressing   Problem: Activity: Goal: Risk for activity intolerance will decrease Outcome: Progressing   Problem: Nutrition: Goal: Adequate nutrition will be maintained Outcome: Progressing   Problem: Coping: Goal: Level of anxiety will decrease Outcome: Progressing   Problem: Elimination: Goal: Will not experience complications related to urinary retention Outcome: Progressing   Problem: Pain Managment: Goal: General experience of comfort will improve Outcome: Progressing   Problem: Safety: Goal: Ability to remain free from injury will improve Outcome: Progressing   Problem: Activity: Goal: Ability to tolerate increased activity will improve Outcome: Progressing   Problem: Respiratory: Goal: Ability to maintain adequate ventilation will improve Outcome: Progressing Goal: Ability to maintain a clear airway will improve Outcome: Progressing   Problem: Coping: Goal: Psychosocial and spiritual needs will be supported Outcome: Progressing   Problem: Respiratory: Goal: Complications related to the disease process, condition or treatment will be avoided or minimized Outcome: Progressing

## 2020-04-30 NOTE — Progress Notes (Signed)
PROGRESS NOTE  Terry Page. PJK:[id number] DOB: November 15, 1955 DOA: 04/10/2020 PCP: [name], MD  Brief History   65 year old man PMH COPD, SIADH, prostate cancer, left lung cancer on XRT presented with shortness of breath 4/9.  Admitted for acute hypoxic respiratory failure secondary to right upper lobe bacterial pneumonia and concurrent COVID-19 infection.  Subsequently found to have staph hominis bacteremia.  Successfully treated and recovered, isolation removed.  However developed small bowel obstruction which resolved spontaneously, followed by general surgery.  Subsequently developed COPD exacerbation and healthcare associated pneumonia.  Somewhat better today but chest x-ray revealed air-fluid level worrisome for hydropneumothorax or cavitating pneumonia.  CT scan pending.  A & P  Acute hypoxic respiratory failure secondary to COPD exacerbation hospital-acquired pneumonia started on antibiotics 4/25. -- Remains short of breath but a bit better today.  On 2 L nasal cannula which is a slight increase.  Given possibility of cavitating pneumonia, will change antibiotics to cover anaerobes.  Acute COPD exacerbation, chronic bullous emphysema, PMH 2 spontaneous pneumothoraces -- COPD flare appears resolved.  Taper steroids.  Chronic leukocytosis --Etiology unclear, seen June and May 2021 as well as throughout this hospitalization. -- Per GI.  Outpatient follow-up.  SBO, resolved --Seen 4/15 and CT abdomen pelvis, followed by general surgery, resolved spontaneously  Abdominal pain.  Significance unclear. -- Imaging was unremarkable and pain is now resolved.  Bowels moving.  Follow clinically.  COVID-pneumonia treated with remdesivir and steroids, completed isolation 4/19  Sepsis present on admission, bacterial pneumonia, treated with azithromycin and ceftriaxone  Primary cancer left upper lobe lung treated with XRT.  CT abdomen pelvis 4/15 raise concern new cavitating nodule left  lower lobe as well as subpleural nodule left lower lobe.  Possible additional nodules right lung. --Close outpatient follow-up with radiation oncology after discharge  Chronic SIADH --stable  Prostate cancer status post XRT Aortic atherosclerosis  Staph hominis bacteremia 4/9, unclear source, lacks indwelling hardware, lines, cardiac devices. TTE no evidence of valvular vegetation.  Seen by ID, completed cefazolin total 7 days from complicated coag negative staph bacteremia.  1/2 staph mitis contaminant --No further evaluation suggested  Disposition Plan:  Discussion: Although looks a little bit better, oxygen requirement is slightly up.  Chest x-ray worrisome for cavitating pneumonia or hydropneumothorax.  CT pending.  Status is: Inpatient  Remains inpatient appropriate because:IV treatments appropriate due to intensity of illness or inability to take PO and Inpatient level of care appropriate due to severity of illness   Dispo:  Patient From: Home  Planned Disposition: [place]  Medically stable for discharge: No     DVT prophylaxis: enoxaparin (LOVENOX) injection 40 mg Start: 04/10/20 2200   Code Status: Full Code Level of care: Progressive Family Communication: none  Murray Hodgkins, MD  [facility] Direct contact: see [email or web address] (further directions at bottom of note if needed) 7PM-7AM contact night coverage as at bottom of note 04/30/2020, 3:45 PM  LOS: 18 days   Significant Hospital Events   . 4/9 admit for pneumonia . 4/16 SBO   Consults:  . ID . General surgery   Antimicrobials:  Ceftriaxone 4/9 > 4/13 Azithromycin 4/9 > 4/13 Cefazolin 4/14 > 4/15 Vancomycin 4/11 > 4/13 + 4/25 > 4/29 Cefepime 4/25 >  Interval History/Subjective  CC: f/u SOB  Feels a little bit better today.  Breathing a little bit better.  No abdominal pain today.  Bowels moving.  Tolerating diet.  Objective   Vitals:  Vitals:   04/30/20 1228  04/30/20 1438   BP: 118/90   Pulse: (!) 105   Resp: 20   Temp:    SpO2: 93% 94%    Exam:  Constitutional:   . Appears calm and comfortable ENMT:  . grossly normal hearing  Respiratory:  . CTA bilaterally, no w/r/r.  . Respiratory effort mildly increased Cardiovascular:  . RRR, no m/r/g . No LE extremity edema   Abdomen:  . Soft, nontender, mildly distended Psychiatric:  . Mental status o Mood, affect appropriate  I have personally reviewed the following:   Today's Data  . UOP 1450 . Na+ 130, stable . WBC down to 11.4 . WBC stable 9.4 . CXR noted  Scheduled Meds: . amLODipine  10 mg Oral Daily  . aspirin EC  81 mg Oral Daily  . enoxaparin (LOVENOX) injection  40 mg Subcutaneous Q24H  . guaiFENesin  600 mg Oral BID  . ipratropium-albuterol  3 mL Nebulization TID  . irbesartan  300 mg Oral Daily  . polyethylene glycol  17 g Oral BID  . predniSONE  40 mg Oral Q breakfast  . senna  1 tablet Oral QHS   Continuous Infusions:   Principal Problem:   Acute respiratory failure with hypoxia ([name]) Active Problems:   COPD with acute exacerbation ([place])   COVID-19 virus infection   Primary cancer of left upper lobe of lung ([name])   Pneumonia of right upper lobe due to infectious organism   SIADH (syndrome of inappropriate ADH production) ([place])   Essential hypertension   Mixed hyperlipidemia   Sepsis ([place])   LOS: 18 days   How to contact the [facility] Attending or Consulting provider [place] or covering provider during after hours [place], for this patient?  1. Check the care team in [facility] and look for a) attending/consulting TRH provider listed and b) the [facility] team listed 2. Log into [email or web address] and use [place]'s universal password to access. If you do not have the password, please contact the hospital operator. 3. Locate the [facility] provider you are looking for under [facility] and page to a number that you can be directly reached. 4. If you still have difficulty reaching the  provider, please page the [facility] (Director on Call) for the Hospitalists listed on amion for assistance.

## 2020-04-30 NOTE — Progress Notes (Signed)
Pharmacy Antibiotic Note  Terry Page. is a 65 y.o. male admitted on 04/10/2020.  Pharmacy has been consulted for cefepime dosing for PNA.  Today, 04/30/20  WBC greatly improved, remains slightly elevated  SCr WNL, CrCl > 60 mL/min. Stable  Afebrile  Today is day #5 IV antibiotics - cefepime + vancomycin; De-escalating to cefepime.  Plan:   Continue cefepime 2 g IV q8h  Renal function has been stable, vancomycin discontinued. Pharmacy to sign off.   Height: 5\' 9"  (175.3 cm) Weight: 76.2 kg (168 lb) IBW/kg (Calculated) : 70.7  Temp (24hrs), Avg:98.5 F (36.9 C), Min:98.4 F (36.9 C), Max:98.5 F (36.9 C)  Recent Labs  Lab 04/25/20 0347 04/26/20 0355 04/27/20 0404 04/28/20 0337 04/30/20 0351  WBC 34.9* 26.8* 20.8* 20.5* 11.4*  CREATININE 0.60* 0.51* 0.48* 0.56* 0.43*    Estimated Creatinine Clearance: 90.8 mL/min (A) (by C-G formula based on SCr of 0.43 mg/dL (L)).    No Known Allergies  Antimicrobials this admission: 4/9 CTX >> 4/14 4/9 Azithro >> 4/14 4/9 Remdesivir >> 4/13 4/11 Vancomycin >> 4/14; 4/25 >> 4/29 4/14 cefazolin > 4/15 4/25 cefepime >>  Thank you for allowing pharmacy to be a part of this patient's care.  Lenis Noon, PharmD 04/30/20 10:46 AM

## 2020-05-01 ENCOUNTER — Inpatient Hospital Stay (HOSPITAL_COMMUNITY): Payer: Medicare HMO

## 2020-05-01 DIAGNOSIS — J181 Lobar pneumonia, unspecified organism: Secondary | ICD-10-CM

## 2020-05-01 DIAGNOSIS — J439 Emphysema, unspecified: Secondary | ICD-10-CM | POA: Diagnosis not present

## 2020-05-01 DIAGNOSIS — J441 Chronic obstructive pulmonary disease with (acute) exacerbation: Secondary | ICD-10-CM | POA: Diagnosis not present

## 2020-05-01 DIAGNOSIS — J189 Pneumonia, unspecified organism: Secondary | ICD-10-CM | POA: Diagnosis not present

## 2020-05-01 DIAGNOSIS — J9601 Acute respiratory failure with hypoxia: Secondary | ICD-10-CM | POA: Diagnosis not present

## 2020-05-01 LAB — BASIC METABOLIC PANEL
Anion gap: 10 (ref 5–15)
BUN: 17 mg/dL (ref 8–23)
CO2: 23 mmol/L (ref 22–32)
Calcium: 8.6 mg/dL — ABNORMAL LOW (ref 8.9–10.3)
Chloride: 98 mmol/L (ref 98–111)
Creatinine, Ser: 0.53 mg/dL — ABNORMAL LOW (ref 0.61–1.24)
GFR, Estimated: >60 mL/min (ref >60)
Glucose, Bld: 149 mg/dL — ABNORMAL HIGH (ref 70–99)
Potassium: 3.3 mmol/L — ABNORMAL LOW (ref 3.5–5.1)
Sodium: 131 mmol/L — ABNORMAL LOW (ref 135–145)

## 2020-05-01 LAB — CRYPTOCOCCAL ANTIGEN: Crypto Ag: NEGATIVE

## 2020-05-01 LAB — CBC
HCT: 29.9 % — ABNORMAL LOW (ref 39.0–52.0)
Hemoglobin: 9.9 g/dL — ABNORMAL LOW (ref 13.0–17.0)
MCH: 29.1 pg (ref 26.0–34.0)
MCHC: 33.1 g/dL (ref 30.0–36.0)
MCV: 87.9 fL (ref 80.0–100.0)
Platelets: 397 10*3/uL (ref 150–400)
RBC: 3.4 MIL/uL — ABNORMAL LOW (ref 4.22–5.81)
RDW: 14.6 % (ref 11.5–15.5)
WBC: 13.3 10*3/uL — ABNORMAL HIGH (ref 4.0–10.5)
nRBC: 0 % (ref 0.0–0.2)

## 2020-05-01 LAB — LACTIC ACID, PLASMA
Lactic Acid, Venous: 2.5 mmol/L (ref 0.5–1.9)
Lactic Acid, Venous: 1.5 mmol/L (ref 0.5–1.9)

## 2020-05-01 LAB — LACTATE DEHYDROGENASE: LDH: 153 U/L (ref 98–192)

## 2020-05-01 LAB — PROTIME-INR
INR: 1.2 (ref 0.8–1.2)
Prothrombin Time: 15 seconds (ref 11.4–15.2)

## 2020-05-01 LAB — MAGNESIUM: Magnesium: 1.5 mg/dL — ABNORMAL LOW (ref 1.7–2.4)

## 2020-05-01 LAB — APTT: aPTT: 31 seconds (ref 24–36)

## 2020-05-01 MED ORDER — CHLORHEXIDINE GLUCONATE 0.12 % MT SOLN
15.0000 mL | Freq: Two times a day (BID) | OROMUCOSAL | Status: DC
  Administered 2020-05-01: 15 mL via OROMUCOSAL
  Filled 2020-05-01: qty 15

## 2020-05-01 MED ORDER — CHLORHEXIDINE GLUCONATE 0.12 % MT SOLN
15.0000 mL | Freq: Two times a day (BID) | OROMUCOSAL | Status: DC
  Administered 2020-05-01 – 2020-05-10 (×15): 15 mL via OROMUCOSAL
  Filled 2020-05-01 (×17): qty 15

## 2020-05-01 MED ORDER — MORPHINE SULFATE (PF) 2 MG/ML IV SOLN
2.0000 mg | INTRAVENOUS | Status: DC | PRN
  Administered 2020-05-01 – 2020-05-05 (×2): 2 mg via INTRAVENOUS
  Filled 2020-05-01 (×2): qty 1

## 2020-05-01 MED ORDER — METHYLPREDNISOLONE SODIUM SUCC 40 MG IJ SOLR
40.0000 mg | Freq: Two times a day (BID) | INTRAMUSCULAR | Status: DC
  Administered 2020-05-01 – 2020-05-02 (×2): 40 mg via INTRAVENOUS
  Filled 2020-05-01 (×2): qty 1

## 2020-05-01 MED ORDER — CHLORHEXIDINE GLUCONATE CLOTH 2 % EX PADS
6.0000 | MEDICATED_PAD | Freq: Every day | CUTANEOUS | Status: DC
  Administered 2020-05-01 – 2020-05-03 (×3): 6 via TOPICAL

## 2020-05-01 MED ORDER — ORAL CARE MOUTH RINSE
15.0000 mL | Freq: Two times a day (BID) | OROMUCOSAL | Status: DC
  Administered 2020-05-02 – 2020-05-10 (×11): 15 mL via OROMUCOSAL

## 2020-05-01 MED ORDER — VANCOMYCIN HCL 1250 MG/250ML IV SOLN
1250.0000 mg | Freq: Once | INTRAVENOUS | Status: AC
  Administered 2020-05-01: 1250 mg via INTRAVENOUS
  Filled 2020-05-01: qty 250

## 2020-05-01 MED ORDER — IPRATROPIUM-ALBUTEROL 0.5-2.5 (3) MG/3ML IN SOLN
3.0000 mL | Freq: Four times a day (QID) | RESPIRATORY_TRACT | Status: DC
  Administered 2020-05-01 – 2020-05-04 (×13): 3 mL via RESPIRATORY_TRACT
  Filled 2020-05-01 (×13): qty 3

## 2020-05-01 MED ORDER — POTASSIUM CHLORIDE CRYS ER 20 MEQ PO TBCR
40.0000 meq | EXTENDED_RELEASE_TABLET | ORAL | Status: AC
  Administered 2020-05-01 (×2): 40 meq via ORAL
  Filled 2020-05-01 (×2): qty 2

## 2020-05-01 MED ORDER — SODIUM CHLORIDE 3 % IN NEBU
4.0000 mL | INHALATION_SOLUTION | Freq: Three times a day (TID) | RESPIRATORY_TRACT | Status: AC
  Administered 2020-05-01 – 2020-05-04 (×8): 4 mL via RESPIRATORY_TRACT
  Filled 2020-05-01 (×9): qty 4

## 2020-05-01 MED ORDER — AMOXICILLIN-POT CLAVULANATE 875-125 MG PO TABS
1.0000 | ORAL_TABLET | Freq: Two times a day (BID) | ORAL | Status: DC
  Administered 2020-05-01 – 2020-05-10 (×18): 1 via ORAL
  Filled 2020-05-01 (×18): qty 1

## 2020-05-01 MED ORDER — ORAL CARE MOUTH RINSE: 15.0000 mL | Freq: Two times a day (BID) | OROMUCOSAL | Status: DC

## 2020-05-01 MED ORDER — VANCOMYCIN HCL 1000 MG/200ML IV SOLN: 1000.0000 mg | Freq: Two times a day (BID) | INTRAVENOUS | Status: DC

## 2020-05-01 MED ORDER — LACTATED RINGERS IV BOLUS (SEPSIS)
1000.0000 mL | Freq: Once | INTRAVENOUS | Status: AC
  Administered 2020-05-01: 1000 mL via INTRAVENOUS

## 2020-05-01 MED ORDER — LACTATED RINGERS IV BOLUS
1500.0000 mL | Freq: Once | INTRAVENOUS | Status: AC
  Administered 2020-05-01: 1500 mL via INTRAVENOUS

## 2020-05-01 NOTE — Consult Note (Signed)
[name]    Date of Admission:  04/10/2020     Reason for Consult: pna, cavitary lungs process    Referring Provider: Sarajane Jews    Lines:  Peripheral iv's   Abx: 4/29-c amp-sulbactam 4/25-c vanc  4/25-29 cefepime  4/09-14 azithromycin 4/09-14 ceftriaxone  4/09-14 remdesivir  Other: 4/25-c methylpred currently 40 q12 as of 4/30  4/09-20 methylpred --> pred       Assessment: Cavitary pna Hypoxic Respiratory failure  Severe covid infection s/p remdesivir/steroid; out of isolation 4/29 Staph hominis bacteremia 4/09 unclear significance (tte negative; s/p 5 day ctrx/vanc) Strep mitis bacteremia 1 of 2 set only on 4/23 ?contaminant Hx left lung cancer s/p xrt Copd with exacerbation  65 yo male chronic leukocytosis (since 2021), copd/emphysema, prostate cancer, hx left lung cancer on current xrt, siadh, admitted 4/09 with dysnpea of 1 day found to have severe covid infection and initially also treated as rul bacterial pna  Course with other complications: 0/35 (admission) bcx with staph hominis 2 of 2 sets drawn at the same time unclear significance sbo followed conservatively by general surgery HCAP -- started tx vanc/cefepime on 4/25. deveoped fever 2 days prior to starting abx with worsening leukocytosis. Copd exacerbation on steroid but degree of leukocytosis rather high for it. However, despite treatment (and initially improving leukocytosis), he had developed worsened hypoxemic resp, and repeat ct 4/29 with new cavitary process of the lungs, compared to 3 weeks ago bilateral nodular lung. Has repeat bcx 4/23 with 1 of 2 set strep mitis likely a contaminant; repeat bcx negative  He has had no respiratory cultures up until this point Recently seems to require more oxygen  I reviewed the ct from 4/09 and 4/29. The initial nodular process is rather unremarkable and small and few in numbers, something covid can present with. The  "cavitation" process on 4/29 seems like blephitis. I query some component of fluid as well with the interstitial changes.  At this time, it is reasonable to treat as blephitis. I will check fungal serology and ask for sputum cx if possible. In the setting of previous strep mitis ?possibly contaminant, will also repeat tte  Of note, he had had chronic leukocytosis since 2021; a blood peripheral smear on 4/25 is without blast or suggestion of cml. He has no flow cytometry yet   Plan: 1. Repeat tte limited in setting strep mitis bacteremia and cavtiary lung process 2. Send fungal serology 3. Sputum culture 4. No mrsa present so will stop vanc 5. Appears ok to transition iv amp-sulb to amox-clav when can take PO 6. Tentatively plan 2-3 weeks amp-sulb or amox-clav and reevaluate 7. Chronic leukocytosis w/u can deferred to outpatient once over this inflammatory/reactive phase     ------------------------------------------------ Principal Problem:   Acute respiratory failure with hypoxia ([name]) Active Problems:   COPD with acute exacerbation ([name])   COVID-19 virus infection   Primary cancer of left upper lobe of lung ([name])   Pneumonia of right upper lobe due to infectious organism   SIADH (syndrome of inappropriate ADH production) ([name])   Essential hypertension   Mixed hyperlipidemia   Sepsis ([name])    HPI: Terry Page. is a 65 y.o. male early stage left lung cancer finished radiation (no chemo), hx prostate cancer s/p brachytherapy, admitted 4/09 for sepsis/hypoxemic resp failure in setting covid infection/CAP, prolonged course due to severe covid infection and recurrent pna  I reviewed chart/imaging  Patient  initially treated for a CAP course/covid. His initial bcx showed CoNS but was sensitive to ceftriaxone. Repeat bcx negative. Echo no vegetation  He did well but then developed recurrent sepsis again 4/23. On 4/25 was placed back on bsAbx for presumed hcap. 4/23 bcx 1 of 2  set with strep mitis. No repeat echo  On 4/29 acute increased respiratory distress chest ct showed "cavitary changes" from initial 4/09 nodular changes and ggo bilaterally. He was transferred to icu for monitoring. He has not had a fever since 4/23. His wbc has trended down but the last 24 hours shows an uptick  He is feeling better today in the icu with increased o2 use currently at 5 liters nasal canula  Said some cough present. Denies headache, n/v/diarrhea, rash. He was getting up to bathroom ?with some help on the floor. He still has appetite and telling me he wants to order dinner  I spoke with nursing. Patient had 2 hours of bipap and that seems to make him much better. He came off bipap 4/29 and has been on 5 liters o2 a litlle more than a few days ago.       Family History  Problem Relation Age of Onset  . Cancer Cousin        maternal cousin  . Cancer Cousin        paternal cousin  . Cancer Cousin   . Colon polyps Neg Hx   . Pancreatic disease Neg Hx   . Pancreatic cancer Neg Hx   . Breast cancer Neg Hx   . Colon cancer Neg Hx     Social History   Tobacco Use  . Smoking status: Former Smoker    Types: Cigarettes    Quit date: 11/22/2019    Years since quitting: 0.4  . Smokeless tobacco: Never Used  Vaping Use  . Vaping Use: Never used  Substance Use Topics  . Alcohol use: Yes    Alcohol/week: 12.0 standard drinks    Types: 12 Cans of beer per week    Comment: daily  . Drug use: No    No Known Allergies  Review of Systems: ROS All Other ROS was negative, except mentioned above   Past Medical History:  Diagnosis Date  . Alcohol abuse   . Asthma   . 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 Essential hypertension 04/10/2020  . Hemorrhoids   . Incidental lung nodule, greater than or equal to 78mm 10/22/2017   Left upper lobe - discovered on CTA  . Prostate cancer ([place])   . Spontaneous pneumothorax 10/20/2017   right  . Tobacco abuse        Scheduled  Meds: . amLODipine  10 mg Oral Daily  . aspirin EC  81 mg Oral Daily  . chlorhexidine  15 mL Mouth Rinse BID  . Chlorhexidine Gluconate Cloth  6 each Topical Daily  . enoxaparin (LOVENOX) injection  40 mg Subcutaneous Q24H  . guaiFENesin  600 mg Oral BID  . ipratropium-albuterol  3 mL Nebulization QID  . irbesartan  300 mg Oral Daily  . mouth rinse  15 mL Mouth Rinse q12n4p  . methylPREDNISolone (SOLU-MEDROL) injection  40 mg Intravenous Q12H  . polyethylene glycol  17 g Oral BID  . senna  1 tablet Oral QHS  . sodium chloride HYPERTONIC  4 mL Nebulization TID   Continuous Infusions: . ampicillin-sulbactam (UNASYN) IV Stopped (05/01/20 1400)  . vancomycin     PRN Meds:.acetaminophen, albuterol, alum & mag hydroxide-simeth,  guaiFENesin-dextromethorphan, melatonin, morphine injection, ondansetron **OR** ondansetron (ZOFRAN) IV, phenol   OBJECTIVE: Blood pressure (!) 147/88, pulse (!) 101, temperature 98.5 F (36.9 C), temperature source Axillary, resp. rate (!) 25, height 5\' 9"  (1.753 m), weight 69.3 kg, SpO2 100 %.  Physical Exam Constitutional:      General: He is not in acute distress.    Appearance: He is ill-appearing.  HENT:     Head: Normocephalic.     Mouth/Throat:     Comments: Dry oral pharynx/mucosa; no rash/lesion intraoral Good dentition Eyes:     Extraocular Movements: Extraocular movements intact.     Pupils: Pupils are equal, round, and reactive to light.  Cardiovascular:     Rate and Rhythm: Normal rate and regular rhythm.  Pulmonary:     Effort: Pulmonary effort is normal.     Comments: Decreased bs bilateral Abdominal:     Palpations: Abdomen is soft.  Musculoskeletal:        General: Normal range of motion.  Skin:    General: Skin is warm.     Findings: No rash.  Neurological:     General: No focal deficit present.     Mental Status: He is alert.     Cranial Nerves: No cranial nerve deficit.  Psychiatric:        Mood and Affect: Mood normal.        Lab Results Lab Results  Component Value Date   WBC 13.3 (H) 05/01/2020   HGB 9.9 (L) 05/01/2020   HCT 29.9 (L) 05/01/2020   MCV 87.9 05/01/2020   PLT 397 05/01/2020    Lab Results  Component Value Date   CREATININE 0.53 (L) 05/01/2020   BUN 17 05/01/2020   NA 131 (L) 05/01/2020   K 3.3 (L) 05/01/2020   CL 98 05/01/2020   CO2 23 05/01/2020    Lab Results  Component Value Date   ALT 106 (H) 04/27/2020   AST 22 04/27/2020   ALKPHOS 82 04/27/2020   BILITOT 0.6 04/27/2020      Microbiology: Recent Results (from the past 240 hour(s))  Culture, blood (routine x 2)     Status: None   Collection Time: 04/24/20  9:47 AM   Specimen: BLOOD  Result Value Ref Range Status   Specimen Description   Final    BLOOD LEFT ANTECUBITAL Performed at [facility], [place] [address] [place], [place] [address]    Special Requests   Final    BOTTLES DRAWN AEROBIC AND ANAEROBIC Blood Culture adequate volume Performed at [facility] [address], [place], [place] [address]    Culture   Final    NO GROWTH 5 DAYS Performed at [facility], [place] [address] [place], [place] [address]    Report Status 04/29/2020 FINAL  Final  Culture, blood (routine x 2)     Status: Abnormal   Collection Time: 04/24/20  9:47 AM   Specimen: BLOOD LEFT HAND  Result Value Ref Range Status   Specimen Description   Final    BLOOD LEFT HAND Performed at [facility] [address], [place], [place] [address]    Special Requests   Final    BOTTLES DRAWN AEROBIC AND ANAEROBIC Blood Culture adequate volume Performed at [facility] [address], [place], [place] [address]    Culture  Setup Time   Final    GRAM POSITIVE COCCI ANAEROBIC BOTTLE ONLY CRITICAL VALUE NOTED.  VALUE IS  CONSISTENT WITH PREVIOUSLY REPORTED AND CALLED VALUE.    Culture (A)  Final    STREPTOCOCCUS MITIS/ORALIS THE SIGNIFICANCE OF ISOLATING  THIS ORGANISM FROM A SINGLE SET OF BLOOD CULTURES WHEN MULTIPLE SETS ARE DRAWN IS UNCERTAIN. PLEASE NOTIFY THE MICROBIOLOGY DEPARTMENT WITHIN ONE WEEK IF SPECIATION AND SENSITIVITIES ARE REQUIRED. Performed at [facility], [place] [address] [place], [place] [address]    Report Status 04/27/2020 FINAL  Final  MRSA PCR Screening     Status: None   Collection Time: 04/29/20  5:41 AM   Specimen: Nasal Mucosa; Nasopharyngeal  Result Value Ref Range Status   MRSA by PCR NEGATIVE NEGATIVE Final    Comment:        The GeneXpert MRSA Assay (FDA approved for NASAL specimens only), is one component of a comprehensive MRSA colonization surveillance program. It is not intended to diagnose MRSA infection nor to guide or monitor treatment for MRSA infections. Performed at [facility], [place] [address] [place],  [address]      Serology:  Micro: 4/30 bcx in process 4/28 mrsa nares culture negative 4/23 bcx 1 of 2 set strep mitis 4/12 bcx negative 4/09 bcx 1 of 4 bottles staph hominis  Imaging: If present, new imagings (plain films, ct scans, and mri) have been personally visualized and interpreted; radiology reports have been reviewed. Decision making incorporated into the Impression / Recommendations.   4/30 cxr 1. Persistent, stable cavitary infiltrate in the right upper lobe. 2. The spiculated nodule in the left upper lobe is seen on the CT scan from April 30, 2020, concerning for primary malignancy. 3. No other interval changes.  4/29 chest ct 1. Severe right upper lobe airspace disease with interval development of cavitary areas in the right upper lobe with multiple air-fluid levels most concerning worsening for necrotizing pneumonia. 2. Interstitial and alveolar airspace disease in the right middle lobe, bilateral lower lobes concerning for worsening multilobar pneumonia. 3. Small right pleural effusion. 4. Spiculated 11 mm left upper lobe  pulmonary nodule concerning for malignancy  4/24 abd/pelv ct 1. Resolved small bowel dilatation from prior exam. Administered enteric contrast reaches the cecum. 2. Liquid stool throughout the colon with scattered air-fluid levels, most consistent with diarrheal illness. No colonic wall thickening or inflammation. 3. Small hepatic hypodensities are stable from prior exam. These likely represent cysts. These are stable from January 2022 included portion of chest CT. Mild hepatic steatosis. 4. Small bilateral pleural effusions are new from prior exam. Left greater than right lower lobe opacity is greater than typically seen with atelectasis and suspicious for pneumonia. Patchy ground-glass opacities in the right middle lobe are also new and likely infectious. 5. Small amount of free fluid in the pericolic gutters and pelvis, with mild increase from prior exam. Body wall edema is increased. 6. Advanced aortic atherosclerosis.  4/23 mri lumbar spine 1. No evidence of lumbar spine infection. 2. Mild lumbar spondylosis and moderate to severe facet arthrosis without spinal stenosis. 3. Mild right neural foraminal stenosis at L4-5  4/13 tte No evidence of valvular vegetations on  this transthoracic echocardiogram. Would recommend a transesophageal  echocardiogram to exclude infective endocarditis if clinically indicated   4/09 chest cta 1. Interval extensive patchy and confluent airspace opacity in the right upper lobe, compatible with pneumonia. 2. Interval small amount of right pleural fluid. 3. Interval mild mediastinal and right hilar adenopathy, most likely reactive. 4. Interval nodular densities in both lungs, as described above. These are concerning  for possible metastases. 5. Interval slight decrease in size of a small spiculated nodule in the left upper lobe compatible with a metastasis or additional primary lung carcinoma. 6. Extensive changes of COPD with bullous  emphysema. 7. Diffuse hepatic steatosis. 8. Calcific coronary artery and aortic atherosclerosis. 9.  Calcific coronary artery and aortic atherosclerosis.  Jabier Mutton, [facility] for Infectious [place] [phone] pager    05/01/2020, 3:26 PM

## 2020-05-01 NOTE — Progress Notes (Signed)
Pharmacy Antibiotic Note  Terry Page. is a 65 y.o. male admitted on 04/10/2020.  Pharmacy has been re-consulted for vancomycin dosing for PNA, concern for possible cavitary PNA.  Today, 05/01/20  WBC greatly improved, remains slightly elevated on 4/29  SCr WNL, CrCl > 60 mL/min. Stable  Afebrile  Today is day #6 IV antibiotics.  Plan:   Vancomycin 1250 mg x1 then resume 1000 mg IV q12h  Goal vancomycin AUC 400-550. Check levels at steady state as needed  Continue ampicillin/sulbactam 3 g IV q6h  Follow renal function  Height: 5\' 9"  (175.3 cm) Weight: 76.2 kg (168 lb) IBW/kg (Calculated) : 70.7  Temp (24hrs), Avg:98.3 F (36.8 C), Min:97.7 F (36.5 C), Max:98.7 F (37.1 C)  Recent Labs  Lab 04/25/20 0347 04/26/20 0355 04/27/20 0404 04/28/20 0337 04/30/20 0351  WBC 34.9* 26.8* 20.8* 20.5* 11.4*  CREATININE 0.60* 0.51* 0.48* 0.56* 0.43*    Estimated Creatinine Clearance: 90.8 mL/min (A) (by C-G formula based on SCr of 0.43 mg/dL (L)).    No Known Allergies  Antimicrobials this admission: 4/9 CTX >> 4/14 4/9 Azithro >> 4/14 4/9 Remdesivir >> 4/13 4/11 Vancomycin >> 4/14; 4/25 >> 4/29; 4/30 >> 4/14 cefazolin > 4/15 4/25 cefepime >> 4/29 4/29 amp/sulbactam >>  Thank you for allowing pharmacy to be a part of this patient's care.  Lenis Noon, PharmD 05/01/20 9:07 AM

## 2020-05-01 NOTE — Progress Notes (Signed)
elink following sepsis 

## 2020-05-01 NOTE — Progress Notes (Signed)
Pt has a critical Lactic acid 2.5 @ 1:24pm. Dr. Sarajane Jews notified at 1:26pm. Fluid Boluses ordered.

## 2020-05-01 NOTE — Consult Note (Addendum)
Name: Terry Page. 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  Transferred to ICU 4/30 for respiratory distress requiring BiPAP  SIGNIFICANT EVENTS  4/30 sudden respiratory distress , saturation 84% on 5 L >> bipap  STUDIES:  Blood culture 4/9 staph hominis 1/2 Blood culture 4/23 strep mitis 1/2  CT chest with contrast 4/29 >> development of cavitary areas in the right upper lobe with multiple air-fluid levels -new compared to 4/9, airspace disease in right middle and bilateral lower lobes, small right effusion, is spiculated 11 mm left upper lobe nodule  CTA chest 4/9 right upper lobe consolidation, extensive changes of bullous emphysema, left upper lobe spiculated nodule   HISTORY OF PRESENT ILLNESS:    65 year old smoker with bullous emphysema admitted 4/9 with shortness of breath found to have right upper lobe patchy infiltrates and COVID-positive.  He is unvaccinated.  He was treated with 5 days of remdesivir and 10 days of steroids, ceftriaxone and azithromycin for 5 days.  He was found to have staph hominis bacteremia 1/2 and was treated with an additional 2 days of cefazolin to complete a 7-day course.  TTE did not show any evidence of vegetation. Course was complicated by small bowel obstruction noted on CT abdomen/pelvis which improved with transient NG tube decompression He had recurrent fever 4/23, MRI did not show any evidence of discitis , respiratory status worsened 4/26 when he started requiring 3 L nasal cannula.  Treated for bronchospasm with IV Solu-Medrol and bronchodilators CT chest was obtained  on 4/29 which showed air-fluid levels in the right upper lobe, hence we are consulted for evaluation of necrotizing pneumonia On 4/30 developed sudden respiratory distress , saturation 84% on 5 L, placed on nonrebreather and due to work of breathing eventually placed on BiPAP and transferred to the ICU  [place] -  05/2019 spontaneous right pneumothorax treated with chest tube History of spontaneous pneumothorax 2019 Prostate cancer status post IMRT 2020 Left upper lobe nodule treated with SBRT 01/1733 Hypermetabolic left upper lobe nodule status post SBRT 04/2020  PAST MEDICAL HISTORY :   has a past medical history of Alcohol abuse, Asthma, Bullous emphysema ([place]), Essential hypertension (04/10/2020), Hemorrhoids, Incidental lung nodule, greater than or equal to 32mm (10/22/2017), Prostate cancer ([place]), Spontaneous pneumothorax (10/20/2017), and Tobacco abuse.  has a past surgical history that includes Back surgery and Prostate biopsy. Prior to Admission medications   Medication Sig Start Date End Date Taking? Authorizing Provider  amLODipine (NORVASC) 10 MG tablet Take 10 mg by mouth daily.   Yes [provider]  aspirin EC 81 MG tablet Take 81 mg by mouth daily.   Yes [provider]  atorvastatin (LIPITOR) 20 MG tablet Take 20 mg by mouth daily. 01/28/19  Yes [provider]  naproxen sodium (ALEVE) 220 MG tablet Take 220 mg by mouth daily as needed (pain).   Yes [provider]  olmesartan (BENICAR) 40 MG tablet Take 40 mg by mouth daily. 11/16/17  Yes [provider]  albuterol (VENTOLIN HFA) 108 (90 Base) MCG/ACT inhaler Inhale 2 puffs into the lungs every 6 (six) hours as needed for wheezing or shortness of breath. Patient not taking: No  sig reported 06/06/19 07/06/19  Elodia Florence., MD  docusate sodium (COLACE) 100 MG capsule Take 100 mg by mouth daily.  Patient not taking: No sig reported    [provider]   No Known  Allergies  FAMILY HISTORY:  family history includes Cancer in his cousin, cousin, and cousin. SOCIAL HISTORY:  reports that he quit smoking about 5 months ago. His smoking use included cigarettes. He has never used smokeless tobacco. He reports current alcohol use of about 12.0 standard drinks of alcohol per week. He reports that he does not use drugs.  REVIEW OF SYSTEMS:   Unable to obtain since he is on BiPAP  SUBJECTIVE:   VITAL SIGNS: Temp:  [97.7 F (36.5 C)-98.7 F (37.1 C)] 98.5 F (36.9 C) (04/30 1257) Pulse Rate:  [92-123] 118 (04/30 1200) Resp:  [16-23] 20 (04/30 1200) BP: (110-137)/(83-88) 110/83 (04/30 1009) SpO2:  [88 %-100 %] 100 % (04/30 1200) Weight:  [69.3 kg] 69.3 kg (04/30 1257)  PHYSICAL EXAMINATION: General: Chronically ill-appearing, on BiPAP full facemask, mild distress Neuro: Alert, interactive, follows commands, nonfocal HEENT: No pallor, no icterus, no JVD Cardiovascular: S1-S2 distant Lungs: Decreased breath sounds bilateral, no rhonchi no accessory muscle use Abdomen: Soft, mildly distended, nontender Musculoskeletal: No deformity, no edema Skin: No rash  Recent Labs  Lab 04/28/20 0337 04/30/20 0351 05/01/20 1238  NA 131* 130* 131*  K 4.3 3.7 3.3*  CL 99 99 98  CO2 22 23 23   BUN 22 15 17   CREATININE 0.56* 0.43* 0.53*  GLUCOSE 143* 102* 149*   Recent Labs  Lab 04/28/20 0337 04/30/20 0351 05/01/20 1238  HGB 9.4* 9.4* 9.9*  HCT 27.9* 27.6* 29.9*  WBC 20.5* 11.4* 13.3*  PLT 424* 377 397   DG Chest 2 View  Result Date: 04/30/2020 CLINICAL DATA:  Shortness of breath.  History of left lung cancer. EXAM: CHEST - 2 VIEW COMPARISON:  April 26, 2020. FINDINGS: The heart size and mediastinal contours are within normal limits. Loculated hydropneumothorax or cavitating pneumonia is noted in right upper lobe. Stable left midlung scarring is noted. The visualized skeletal structures are unremarkable. IMPRESSION: Air-fluid level is noted in right  upper lobe consistent with loculated hydropneumothorax or cavitating pneumonia. CT scan is recommended for further evaluation. Electronically Signed   By: Marijo Conception M.D.   On: 04/30/2020 12:25   CT CHEST W CONTRAST  Result Date: 04/30/2020 CLINICAL DATA:  Pneumonia. Shortness of breath. 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 CONTRAST:  76mL OMNIPAQUE IOHEXOL 300 MG/ML  SOLN COMPARISON:  04/10/2020, 04/26/2020, 01/07/2020, 02/06/2020 (PET-CT) FINDINGS: Cardiovascular: No significant vascular findings. Normal heart size. No pericardial effusion. Thoracic aortic atherosclerosis. Multi vessel coronary artery atherosclerosis. Mediastinum/Nodes: Enlarged subcarinal lymph node measuring 12 mm. No hilar or axillary lymphadenopathy. Esophagus is normal. Trachea is normal. Thyroid gland is normal. Lungs/Pleura: Spiculated 11 mm left upper lobe pulmonary nodule. 7 mm nodular area in the left upper lobe more inferiorly adjacent to a bulla similar in appearance to the prior examination likely reflecting area of scarring. Bilateral emphysematous changes with bullous disease at the apices. Severe right upper lobe airspace disease. Interval development of cavitary areas in the right upper lobe with multiple air-fluid levels most concerning for necrotizing pneumonia with the overall area measuring approximately 9.2 x 7.8 cm in transverse dimension. Small right pleural effusion. Interstitial and alveolar airspace  disease in the right middle lobe, bilateral lower lobes concerning for multilobar pneumonia. Upper Abdomen: No acute abnormality.  Atrophic spleen. Musculoskeletal: No acute osseous abnormality. IMPRESSION: 1. Severe right upper lobe airspace disease with interval development of cavitary areas in the right upper lobe with multiple air-fluid levels most concerning  worsening for necrotizing pneumonia. 2. Interstitial and alveolar airspace disease in the right middle lobe, bilateral lower lobes concerning for worsening multilobar pneumonia. 3. Small right pleural effusion. 4. Spiculated 11 mm left upper lobe pulmonary nodule concerning for malignancy. 5. Aortic Atherosclerosis (ICD10-I70.0) and Emphysema (ICD10-J43.9). Electronically Signed   By: Kathreen Devoid   On: 04/30/2020 21:05   DG CHEST PORT 1 VIEW  Result Date: 05/01/2020 CLINICAL DATA:  Worsening shortness of breath and cough. EXAM: PORTABLE CHEST 1 VIEW COMPARISON:  CT scan April 30, 2020.  Chest x-ray April 30, 2020. FINDINGS: The right upper lobe infiltrate with associated cavitation is stable. The spiculated nodule in the left upper lobe is unchanged. Emphysematous changes are noted. Mild bibasilar opacities are stable. Scarring in the left perihilar region is stable. No other interval changes. IMPRESSION: 1. Persistent, stable cavitary infiltrate in the right upper lobe. 2. The spiculated nodule in the left upper lobe is seen on the CT scan from April 30, 2020, concerning for primary malignancy. 3. No other interval changes. Electronically Signed   By: Dorise Bullion III M.D   On: 05/01/2020 12:28    ASSESSMENT / PLAN:  Acute hypoxic respiratory failure -not sure what triggered this, does not seem to have been a witnessed aspiration episode. -No obvious bronchospasm currently but could have been triggered with secretions in the airway  -Using BiPAP for work of breathing, okay to give him a break in 4 hours and use as needed -Treat as COPD exacerbation with IV Solu-Medrol 40 every 12 and bronchodilators  Acute necrotizing pneumonia right upper lobe, seems to have been concurrent with COVID illness, on review of imaging this appears to be more due to infected bulla rather than cavitation, with air-fluid level  -Agree with Unasyn and vancomycin currently, will need long course of antibiotics  sometimes 4 to 6 weeks till bulla fully clears up. -Postural drainage of right upper lobe -Hypertonic saline and flutter valve for tracheobronchial toilet -No indication for bronchoscopy or IR drainage  PCCM will follow with you  Kara Mead MD. FCCP. [facility] Pager : [phone]  If no response to pager , please call [phone] until 7 pm After 7:00 pm call Elink  [phone]     05/01/2020, 1:59 PM

## 2020-05-01 NOTE — Progress Notes (Signed)
Patient endorsed, "unable to breath," Noted to have increased work of breathing. Placed on cardiac monitor. Placed on pulse oximetry. Listened to lung sounds and lung sounds were diminished, with expiratory wheezing and crackles heard throughout. Sats noted to be 84% on 5L of nasal cannula. Bumped up to 6L of nasal canula, with no improvement, sats only went up to 85%. Moved patient back to bed from the chair. Once placed back in bed, patient was placed on non-rebreather at 15, and HOB was sat up to 90 plus degrees.  Sats come up to 98-99% instantly. Charge nurse and rapid response was called for patient and where present during this event. MD Sarajane Jews also paged by charge RN. This took place around 1112. Patient then was transferred to ICU bed 1225. Report given to [facility].

## 2020-05-01 NOTE — Progress Notes (Addendum)
PROGRESS NOTE  Terry Page. UXL:[id number] DOB: 19-Jun-1955 DOA: 04/10/2020 PCP: [name], MD  Brief History   65 year old man PMH COPD, SIADH, prostate cancer, left lung cancer on XRT presented with shortness of breath 4/9.  Admitted for acute hypoxic respiratory failure secondary to right upper lobe bacterial pneumonia and concurrent COVID-19 infection.  Subsequently found to have staph hominis bacteremia.  Successfully treated and recovered, isolation removed.  However developed small bowel obstruction which resolved spontaneously, followed by general surgery.  Subsequently developed COPD exacerbation and healthcare associated pneumonia.  Somewhat better today but chest x-ray revealed air-fluid level worrisome for hydropneumothorax or cavitating pneumonia.  CT scan pending.  A & P  Acute hypoxic respiratory failure secondary to COPD exacerbation hospital-acquired pneumonia started on antibiotics 4/25. -- Remains short of breath but a bit better today.  On 2 L nasal cannula which is a slight increase.  Given possibility of cavitating pneumonia, will change antibiotics to cover anaerobes.  Acute COPD exacerbation, chronic bullous emphysema, PMH 2 spontaneous pneumothoraces -- COPD flare appears resolved.  Taper steroids.  Chronic leukocytosis --Etiology unclear, seen June and May 2021 as well as throughout this hospitalization. -- Per GI.  Outpatient follow-up.  SBO, resolved --Seen 4/15 and CT abdomen pelvis, followed by general surgery, resolved spontaneously  Abdominal pain.  Significance unclear. -- Imaging was unremarkable and pain is now resolved.  Bowels moving.  Follow clinically.  COVID-pneumonia treated with remdesivir and steroids, completed isolation 4/19  Sepsis present on admission, bacterial pneumonia, treated with azithromycin and ceftriaxone  Primary cancer left upper lobe lung treated with XRT.  CT abdomen pelvis 4/15 raise concern new cavitating nodule left  lower lobe as well as subpleural nodule left lower lobe.  Possible additional nodules right lung. --Close outpatient follow-up with radiation oncology after discharge  Chronic SIADH --stable  Prostate cancer status post XRT Aortic atherosclerosis  Staph hominis bacteremia 4/9, unclear source, lacks indwelling hardware, lines, cardiac devices. TTE no evidence of valvular vegetation.  Seen by ID, completed cefazolin total 7 days from complicated coag negative staph bacteremia.  1/2 staph mitis contaminant --No further evaluation suggested  Disposition Plan:  Discussion: seen earlier in AM, was on 5L and appeared stable. Transferred to SDU for increased resp distress and started on BiPAP. Pt denies aspiration, no chest pain. Exam 100% on BiPAPm, resp 20s, HR 110s. Lungs clear but sounds distant. Awake, alert, ill, non-toxic.  Will continue abx, BiPAP, r/o sepsis, will check CXR to r/o spont pneumo / new acute process and EKG though no pain or reason to suspect ACS. Will f/u with pulmonology.  Status is: Inpatient  Remains inpatient appropriate because:IV treatments appropriate due to intensity of illness or inability to take PO and Inpatient level of care appropriate due to severity of illness   Dispo:  Patient From: Home  Planned Disposition: [place]  Medically stable for discharge: No     DVT prophylaxis: enoxaparin (LOVENOX) injection 40 mg Start: 04/10/20 2200   Code Status: Full Code Level of care: Progressive Family Communication: none  Murray Hodgkins, MD  [facility] Direct contact: see [email or web address] (further directions at bottom of note if needed) 7PM-7AM contact night coverage as at bottom of note 05/01/2020, 10:48 AM  LOS: 19 days   Significant Hospital Events   . 4/9 admit for pneumonia . 4/16 SBO . 4/29 CT chest: severe RUL disease w/ multiple fluid levels, consider necrotizing pneumonia; multilobar pneumonia   Consults:  . ID . General  surgery . Pulmonology    Antimicrobials:  Ceftriaxone 4/9 > 4/13 Azithromycin 4/9 > 4/13 Cefazolin 4/14 > 4/15 Vancomycin 4/11 > 4/13 + 4/25 > 4/29, 4/30 >  Cefepime 4/25 >  Interval History/Subjective  CC: f/u SOB Worse today, more SOB, up to 5L overnight Bowels moving, mild lower abd pain Tolerating diet  Objective   Vitals:  Vitals:   05/01/20 0835 05/01/20 1009  BP:  110/83  Pulse:    Resp:    Temp:    SpO2: 90%     Exam:  Constitutional:   . Appears calm, ill, tired, non-toxic ENMT:  . grossly normal hearing  Respiratory:  . CTA bilaterally, no w/r/r. Diminished BS on right. [name] Respiratory effort mildly increased.  Cardiovascular:  . RRR, no m/r/g . No LE extremity edema   Abdomen:  . Soft, ntnd, skin appears unremarkable Psychiatric:  . Mental status o Mood, affect appropriate  I have personally reviewed the following:   Today's Data  . CT chest noted  Scheduled Meds: . amLODipine  10 mg Oral Daily  . aspirin EC  81 mg Oral Daily  . enoxaparin (LOVENOX) injection  40 mg Subcutaneous Q24H  . guaiFENesin  600 mg Oral BID  . ipratropium-albuterol  3 mL Nebulization TID  . irbesartan  300 mg Oral Daily  . polyethylene glycol  17 g Oral BID  . senna  1 tablet Oral QHS   Continuous Infusions: . ampicillin-sulbactam (UNASYN) IV 3 g (05/01/20 0526)  . vancomycin    . vancomycin 1,250 mg (05/01/20 1019)    Principal Problem:   Acute respiratory failure with hypoxia ([name]) Active Problems:   COPD with acute exacerbation ([place])   COVID-19 virus infection   Primary cancer of left upper lobe of lung ([name])   Pneumonia of right upper lobe due to infectious organism   SIADH (syndrome of inappropriate ADH production) ([place])   Essential hypertension   Mixed hyperlipidemia   Sepsis ([place])   LOS: 19 days   How to contact the [facility] Attending or Consulting provider [place] or covering provider during after hours [place], for this patient?  1. Check the care  team in [facility] and look for a) attending/consulting [facility] provider listed and b) the [facility] team listed 2. Log into [email or web address] and use [place]'s universal password to access. If you do not have the password, please contact the hospital operator. 3. Locate the [facility] provider you are looking for under [facility] and page to a number that you can be directly reached. 4. If you still have difficulty reaching the provider, please page the [facility] (Director on Call) for the Hospitalists listed on amion for assistance.

## 2020-05-02 ENCOUNTER — Inpatient Hospital Stay (HOSPITAL_COMMUNITY): Payer: Medicare HMO

## 2020-05-02 DIAGNOSIS — J984 Other disorders of lung: Secondary | ICD-10-CM

## 2020-05-02 DIAGNOSIS — R7881 Bacteremia: Secondary | ICD-10-CM

## 2020-05-02 DIAGNOSIS — J189 Pneumonia, unspecified organism: Secondary | ICD-10-CM | POA: Diagnosis not present

## 2020-05-02 DIAGNOSIS — J9601 Acute respiratory failure with hypoxia: Secondary | ICD-10-CM | POA: Diagnosis not present

## 2020-05-02 DIAGNOSIS — J439 Emphysema, unspecified: Secondary | ICD-10-CM | POA: Diagnosis not present

## 2020-05-02 DIAGNOSIS — J181 Lobar pneumonia, unspecified organism: Secondary | ICD-10-CM | POA: Diagnosis not present

## 2020-05-02 DIAGNOSIS — T17908A Unspecified foreign body in respiratory tract, part unspecified causing other injury, initial encounter: Secondary | ICD-10-CM

## 2020-05-02 LAB — BASIC METABOLIC PANEL
Anion gap: 9 (ref 5–15)
BUN: 16 mg/dL (ref 8–23)
CO2: 25 mmol/L (ref 22–32)
Calcium: 9 mg/dL (ref 8.9–10.3)
Chloride: 103 mmol/L (ref 98–111)
Creatinine, Ser: 0.53 mg/dL — ABNORMAL LOW (ref 0.61–1.24)
GFR, Estimated: >60 mL/min (ref >60)
Glucose, Bld: 151 mg/dL — ABNORMAL HIGH (ref 70–99)
Potassium: 4.5 mmol/L (ref 3.5–5.1)
Sodium: 137 mmol/L (ref 135–145)

## 2020-05-02 LAB — ECHOCARDIOGRAM LIMITED
Height: 69 in
S' Lateral: 3.6 cm
Weight: 2444.46 oz

## 2020-05-02 LAB — CBC
HCT: 28.5 % — ABNORMAL LOW (ref 39.0–52.0)
Hemoglobin: 9.6 g/dL — ABNORMAL LOW (ref 13.0–17.0)
MCH: 29.1 pg (ref 26.0–34.0)
MCHC: 33.7 g/dL (ref 30.0–36.0)
MCV: 86.4 fL (ref 80.0–100.0)
Platelets: 398 10*3/uL (ref 150–400)
RBC: 3.3 MIL/uL — ABNORMAL LOW (ref 4.22–5.81)
RDW: 14.6 % (ref 11.5–15.5)
WBC: 9.9 10*3/uL (ref 4.0–10.5)
nRBC: 0 % (ref 0.0–0.2)

## 2020-05-02 MED ORDER — SALINE SPRAY 0.65 % NA SOLN
1.0000 | NASAL | Status: DC | PRN
  Administered 2020-05-02: 1 via NASAL
  Filled 2020-05-02: qty 44

## 2020-05-02 MED ORDER — SODIUM CHLORIDE 0.9 % IV SOLN: INTRAVENOUS | Status: DC

## 2020-05-02 MED ORDER — METHYLPREDNISOLONE SODIUM SUCC 40 MG IJ SOLR
40.0000 mg | INTRAMUSCULAR | Status: DC
  Administered 2020-05-03 – 2020-05-04 (×2): 40 mg via INTRAVENOUS
  Filled 2020-05-02 (×2): qty 1

## 2020-05-02 MED ORDER — MAGNESIUM SULFATE 2 GM/50ML IV SOLN
2.0000 g | Freq: Once | INTRAVENOUS | Status: AC
  Administered 2020-05-02: 2 g via INTRAVENOUS
  Filled 2020-05-02: qty 50

## 2020-05-02 NOTE — Progress Notes (Signed)
Name: Terry Page. 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  Transferred to ICU 4/30 for respiratory distress requiring BiPAP  SIGNIFICANT EVENTS  4/30 sudden respiratory distress , saturation 84% on 5 L >> bipap  STUDIES:  Blood culture 4/9 staph hominis 1/2 Blood culture 4/23 strep mitis 1/2  CT chest with contrast 4/29 >> development of cavitary areas in the right upper lobe with multiple air-fluid levels -new compared to 4/9, airspace disease in right middle and bilateral lower lobes, small right effusion, is spiculated 11 mm left upper lobe nodule  CTA chest 4/9 right upper lobe consolidation, extensive changes of bullous emphysema, left upper lobe spiculated nodule   HISTORY OF PRESENT ILLNESS:    65 year old smoker with bullous emphysema admitted 4/9 with shortness of breath found to have right upper lobe patchy infiltrates and COVID-positive.  He is unvaccinated.  He was treated with 5 days of remdesivir and 10 days of steroids, ceftriaxone and azithromycin for 5 days.  He was found to have staph hominis bacteremia 1/2 and was treated with an additional 2 days of cefazolin to complete a 7-day course.  TTE did not show any evidence of vegetation. Course was complicated by small bowel obstruction noted on CT abdomen/pelvis which improved with transient NG tube decompression He had recurrent fever 4/23, MRI did not show any evidence of discitis , respiratory status worsened 4/26 when he started requiring 3 L nasal cannula.  Treated for bronchospasm with IV Solu-Medrol and bronchodilators CT chest was obtained on  4/29 which showed air-fluid levels in the right upper lobe, hence we are consulted for evaluation of necrotizing pneumonia On 4/30 developed sudden respiratory distress , saturation 84% on 5 L, placed on nonrebreather and due to work of breathing eventually placed on BiPAP and transferred to the ICU  PAST MEDICAL HISTORY :   05/2019 spontaneous right pneumothorax treated with chest tube History of spontaneous pneumothorax 2019 Prostate cancer status post IMRT 2020 Left upper lobe nodule treated with SBRT 06/9627 Hypermetabolic left upper lobe nodule status post SBRT 04/2020      SUBJECTIVE: Breathing better Afebrile On 3-5 L nasal cannula  VITAL SIGNS: Temp:  [97.7 F (36.5 C)-98.1 F (36.7 C)] 98.1 F (36.7 C) (05/01 1200) Pulse Rate:  [58-116] 110 (05/01 0800) Resp:  [16-27] 18 (05/01 0800) BP: (116-187)/(63-100) 133/71 (05/01 0800) SpO2:  [92 %-100 %] 100 % (05/01 1057)  PHYSICAL EXAMINATION: General: Chronically ill-appearing, on nasal cannula, no distress Neuro: Alert, interactive, nonfocal HEENT: No pallor, no icterus, no JVD Cardiovascular: S1-S2 distant Lungs: Barrel chest, decreased breath sounds bilateral, no rhonchi, no accessory muscle use Abdomen: Soft, mildly distended, nontender Musculoskeletal: No deformity, no edema Skin: No rash  Recent Labs  Lab 04/30/20 0351 05/01/20 1238 05/02/20 0221  NA 130* 131* 137  K 3.7 3.3* 4.5  CL 99 98 103  CO2 23 23 25   BUN 15 17 16   CREATININE 0.43* 0.53* 0.53*  GLUCOSE 102* 149* 151*   Recent Labs  Lab 04/30/20 0351 05/01/20 1238 05/02/20 0221  HGB 9.4* 9.9* 9.6*  HCT 27.6* 29.9* 28.5*  WBC 11.4* 13.3* 9.9  PLT 377 397 398   CT CHEST  W CONTRAST  Result Date: 04/30/2020 CLINICAL DATA:  Pneumonia. Shortness of breath. 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  CONTRAST:  84mL OMNIPAQUE IOHEXOL 300 MG/ML  SOLN COMPARISON:  04/10/2020, 04/26/2020, 01/07/2020, 02/06/2020 (PET-CT) FINDINGS: Cardiovascular: No significant vascular findings. Normal heart size. No pericardial effusion. Thoracic aortic atherosclerosis. Multi vessel coronary artery atherosclerosis. Mediastinum/Nodes: Enlarged subcarinal lymph node measuring 12 mm. No hilar or axillary lymphadenopathy. Esophagus is normal. Trachea is normal. Thyroid gland is normal. Lungs/Pleura: Spiculated 11 mm left upper lobe pulmonary nodule. 7 mm nodular area in the left upper lobe more inferiorly adjacent to a bulla similar in appearance to the prior examination likely reflecting area of scarring. Bilateral emphysematous changes with bullous disease at the apices. Severe right upper lobe airspace disease. Interval development of cavitary areas in the right upper lobe with multiple air-fluid levels most concerning for necrotizing pneumonia with the overall area measuring approximately 9.2 x 7.8 cm in transverse dimension. Small right pleural effusion. Interstitial and alveolar airspace disease in the right middle lobe, bilateral lower lobes concerning for multilobar pneumonia. Upper Abdomen: No acute abnormality.  Atrophic spleen. Musculoskeletal: No acute osseous abnormality. IMPRESSION: 1. Severe right upper lobe airspace disease with interval development of cavitary areas in the right upper lobe with multiple air-fluid levels most concerning worsening for necrotizing pneumonia. 2. Interstitial and alveolar airspace disease in the right middle lobe, bilateral lower lobes concerning for worsening multilobar pneumonia. 3. Small right pleural effusion. 4. Spiculated 11 mm left upper lobe pulmonary nodule concerning for malignancy. 5. Aortic Atherosclerosis (ICD10-I70.0) and Emphysema (ICD10-J43.9). Electronically Signed   By: Kathreen Devoid   On: 04/30/2020 21:05   DG CHEST PORT 1 VIEW  Result Date: 05/01/2020 CLINICAL DATA:   Worsening shortness of breath and cough. EXAM: PORTABLE CHEST 1 VIEW COMPARISON:  CT scan April 30, 2020.  Chest x-ray April 30, 2020. FINDINGS: The right upper lobe infiltrate with associated cavitation is stable. The spiculated nodule in the left upper lobe is unchanged. Emphysematous changes are noted. Mild bibasilar opacities are stable. Scarring in the left perihilar region is stable. No other interval changes. IMPRESSION: 1. Persistent, stable cavitary infiltrate in the right upper lobe. 2. The spiculated nodule in the left upper lobe is seen on the CT scan from April 30, 2020, concerning for primary malignancy. 3. No other interval changes. Electronically Signed   By: Dorise Bullion III M.D   On: 05/01/2020 12:28   ECHOCARDIOGRAM LIMITED  Result Date: 05/02/2020    ECHOCARDIOGRAM LIMITED REPORT   Patient Name:   Terry Page. Date of Exam: 05/02/2020 Medical Rec #:  [id number]            Height:       69.0 in Accession #:    [id number]           Weight:       152.8 lb Date of Birth:  1955-08-02             BSA:          1.843 m Patient Age:    27 years             BP:           112/64 mmHg Patient Gender: M                    HR:  92 bpm. Exam Location:  Inpatient Procedure: Limited Echo and Limited Color Doppler Indications:    Bacteremia R78.81  History:        Patient has prior history of Echocardiogram examinations, most                 recent 04/14/2020. Risk Factors:Current Smoker and Hypertension.                 COVID-19. Cancer. Alcohol abuse.  Sonographer:    Tiffany Dance Referring Phys: [id number] [place]  1. Left ventricular ejection fraction, by estimation, is 65 to 70%. The left ventricle has normal function. The left ventricle has no regional wall motion abnormalities.  2. Right ventricular systolic function is normal. The right ventricular size is normal.  3. There is a rounded density off the anterior mitral valve leaflet that likely is calcification, but in the  setting of bacteremia, cannot rule out vegetation. The lack of any significant MR makes endocarditis less likely. No evidence of mitral valve regurgitation. No evidence of mitral stenosis.  4. The aortic valve is normal in structure. Aortic valve regurgitation is not visualized. No aortic stenosis is present.  5. There is mild dilatation of the aortic root, measuring 40 mm.  6. The inferior vena cava is normal in size with greater than 50% respiratory variability, suggesting right atrial pressure of 3 mmHg. FINDINGS  Left Ventricle: Left ventricular ejection fraction, by estimation, is 65 to 70%. The left ventricle has normal function. The left ventricle has no regional wall motion abnormalities. The left ventricular internal cavity size was normal in size. There is  no left ventricular hypertrophy. Right Ventricle: The right ventricular size is normal. No increase in right ventricular wall thickness. Right ventricular systolic function is normal. Left Atrium: Left atrial size was normal in size. Right Atrium: Right atrial size was normal in size. Pericardium: There is no evidence of pericardial effusion. Mitral Valve: There is a rounded density off the anterior mitral valve leaflet that likely is calcification, but in the setting of bacteremia, cannot rule out vegetation. The mitral valve is normal in structure. No evidence of mitral valve stenosis. Tricuspid Valve: The tricuspid valve is normal in structure. Tricuspid valve regurgitation is not demonstrated. No evidence of tricuspid stenosis. Aortic Valve: The aortic valve is normal in structure. Aortic valve regurgitation is not visualized. No aortic stenosis is present. Pulmonic Valve: The pulmonic valve was normal in structure. Pulmonic valve regurgitation is trivial. No evidence of pulmonic stenosis. Aorta: The aortic root is normal in size and structure. There is mild dilatation of the aortic root, measuring 40 mm. Venous: The inferior vena cava is normal in  size with greater than 50% respiratory variability, suggesting right atrial pressure of 3 mmHg. IAS/Shunts: The interatrial septum appears to be lipomatous. No atrial level shunt detected by color flow Doppler. LEFT VENTRICLE PLAX 2D LVIDd:         4.30 cm LVIDs:         3.60 cm LV PW:         1.20 cm LV IVS:        1.10 cm LVOT diam:     2.20 cm LVOT Area:     3.80 cm  IVC IVC diam: 1.20 cm LEFT ATRIUM         Index LA diam:    2.80 cm 1.52 cm/m   AORTA Ao Root diam: 4.00 cm  SHUNTS Systemic Diam: 2.20 cm Fransico Him MD  Electronically signed by Fransico Him MD Signature Date/Time: 05/02/2020/1:27:17 PM    Final     ASSESSMENT / PLAN:  Acute hypoxic respiratory failure -not sure what triggered his acute decompensation on 4/30, he denies previous aspiration episodes but reasonable to proceed with swallow eval -No obvious bronchospasm currently but could have been triggered with secretions in the airway  -Transiently required BiPAP, now on nasal cannula -Can decrease Solu-Medrol to 40 every 24 and switch to prednisone with a rapid taper over 1 week  Acute necrotizing pneumonia right upper lobe, seems to have been concurrent with COVID illness, on review of imaging this appears to be more due to infected bulla rather than cavitation, with air-fluid level  -Agree with Unasyn and vancomycin currently, will need long course of antibiotics sometimes 4 to 6 weeks till bulla fully clears up, can use Augmentin once acute phase improved -Postural drainage of right upper lobe -Hypertonic saline and flutter valve for tracheobronchial toilet  Will need outpatient follow-up to resolution, if refractory, can consider bronchoscopy however would be high risk of pneumothorax in the setting with bullous disease  PCCM will follow   Kara Mead MD. FCCP. [facility] Pager : [phone]  If no response to pager , please call [phone] until 7 pm After 7:00 pm call Elink  [phone]      05/02/2020, 1:28 PM

## 2020-05-02 NOTE — Progress Notes (Signed)
  Echocardiogram 2D Echocardiogram has been performed.  [name] 05/02/2020, 1:08 PM

## 2020-05-02 NOTE — Progress Notes (Signed)
PROGRESS NOTE  Terry Page. BVQ:[id number] DOB: [birth year] DOA: 04/10/2020 PCP: [name], MD  Brief History   65 year old man PMH COPD, SIADH, prostate cancer, left lung cancer on XRT presented with shortness of breath 4/9.  Admitted for acute hypoxic respiratory failure secondary to right upper lobe bacterial pneumonia and concurrent COVID-19 infection.  Subsequently found to have staph hominis bacteremia.  Successfully treated and recovered, isolation removed.  However developed small bowel obstruction which resolved spontaneously, followed by general surgery.  Subsequently developed COPD exacerbation and healthcare associated pneumonia.  Failed to improve, CT chest showed infected bulla, multilobar pneumonia.  Condition worsened, required BiPAP, transferred to stepdown.  Seen by infectious disease and pulmonology.  Transitioned to oral antibiotics.  Subsequently noted to have choking episodes with food.  Made n.p.o., speech therapy evaluation pending.  A & P  Acute hypoxic respiratory failure secondary to multilobar pneumonia, infected bulla. --Appears better today.  On high flow nasal cannula 5 L.  Respiratory status appears much more stable today.  Discussed with Dr. Elsworth Soho.  Discussed with ID yesterday. -- Suspect aspiration episode triggered decline --Continue antibiotics.  Follow-up with pulmonology as an outpatient. --Wean oxygen as tolerated.  BiPAP as needed.  Remain in stepdown today.  Suspected dysphagia.  Multiple choking episodes. --NPO.  Speech therapy evaluation.  Acute COPD exacerbation, chronic bullous emphysema, PMH 2 spontaneous pneumothoraces -- COPD stable  Primary cancer left upper lobe lung treated with XRT.  CT abdomen pelvis 4/15 raise concern new cavitating nodule left lower lobe as well as subpleural nodule left lower lobe.  Possible additional nodules right lung. --Close outpatient follow-up with radiation oncology after discharge  Chronic  leukocytosis --Etiology unclear, seen June and May 2021 as well as throughout this hospitalization.  SBO, resolved --Seen 4/15 and CT abdomen pelvis, followed by general surgery, resolved spontaneously  Abdominal pain.   --Significance remains unclear.  No pathology found.  Continue to monitor.  COVID-pneumonia treated with remdesivir and steroids, completed isolation 4/19 per chart  Sepsis present on admission, bacterial pneumonia, treated with azithromycin and ceftriaxone  Chronic SIADH --stable  Anemia of chronic disease, stable.  Prostate cancer status post XRT Aortic atherosclerosis --no tx indicated  Staph hominis bacteremia 4/9, unclear source, lacks indwelling hardware, lines, cardiac devices. TTE no evidence of valvular vegetation.  Seen by ID, completed cefazolin total 7 days from complicated coag negative staph bacteremia.  1/2 staph mitis contaminant --No further evaluation suggested    Disposition Plan:  Discussion: Better today.  Speech therapy evaluation.  Continue respiratory treatments.  Monitor in stepdown given decompensation yesterday.  Given further history from patient, I suspect aspiration episode yesterday triggered his decline.  Status is: Inpatient  Remains inpatient appropriate because:IV treatments appropriate due to intensity of illness or inability to take PO and Inpatient level of care appropriate due to severity of illness   Dispo:  Patient From: Home  Planned Disposition: [place]  Medically stable for discharge: No     DVT prophylaxis: enoxaparin (LOVENOX) injection 40 mg Start: 04/10/20 2200   Code Status: Full Code Level of care: Stepdown Family Communication: none  Murray Hodgkins, MD  [facility] Direct contact: see [email or web address] (further directions at bottom of note if needed) 7PM-7AM contact night coverage as at bottom of note 05/02/2020, 12:57 PM  LOS: 20 days   Significant Hospital Events   . 4/9 admit  for pneumonia . 4/16 SBO . 4/29 CT chest: severe RUL disease w/ multiple fluid levels, consider necrotizing  pneumonia; multilobar pneumonia   Consults:  . ID . General surgery . Pulmonology    Antimicrobials:  Ceftriaxone 4/9 > 4/13 Azithromycin 4/9 > 4/13 Cefazolin 4/14 > 4/15 Vancomycin 4/11 > 4/13 + 4/25 > 4/29, 4/30   Cefepime 4/25 > 4/30 Augmentin 5/1 >  Interval History/Subjective  CC: f/u SOB Feels ok, breathing better, got choked on breakfast this AM  Objective   Vitals:  Vitals:   05/02/20 1057 05/02/20 1200  BP:    Pulse:    Resp:    Temp:  98.1 F (36.7 C)  SpO2: 100%     Exam:  Constitutional:   . Appears calm, more comfortable today, non-toxic ENMT:  . grossly normal hearing  Respiratory:  . CTA bilaterally, no w/r/r.  . Respiratory effort normal. Fair air movement Cardiovascular:  . RRR, no m/r/g . No LE extremity edema   . telemetry ST Abdomen:  . Soft ntnd Psychiatric:  . Mental status o Mood, affect appropriate  I have personally reviewed the following:   Today's Data  . BMP noted . LA returned to normal limits . Hgb stable 9.6  Scheduled Meds: . amLODipine  10 mg Oral Daily  . amoxicillin-clavulanate  1 tablet Oral Q12H  . aspirin EC  81 mg Oral Daily  . chlorhexidine  15 mL Mouth Rinse BID  . Chlorhexidine Gluconate Cloth  6 each Topical Daily  . enoxaparin (LOVENOX) injection  40 mg Subcutaneous Q24H  . guaiFENesin  600 mg Oral BID  . ipratropium-albuterol  3 mL Nebulization QID  . irbesartan  300 mg Oral Daily  . mouth rinse  15 mL Mouth Rinse q12n4p  . methylPREDNISolone (SOLU-MEDROL) injection  40 mg Intravenous Q12H  . polyethylene glycol  17 g Oral BID  . senna  1 tablet Oral QHS  . sodium chloride HYPERTONIC  4 mL Nebulization TID   Continuous Infusions: . sodium chloride 75 mL/hr at 05/02/20 1001    Principal Problem:   Acute respiratory failure with hypoxia ([name]) Active Problems:   COPD with acute  exacerbation ([place])   COVID-19 virus infection   Primary cancer of left upper lobe of lung ([name])   Pneumonia of both lungs due to infectious organism   SIADH (syndrome of inappropriate ADH production) ([place])   Essential hypertension   Mixed hyperlipidemia   Sepsis ([place])   LOS: 20 days   How to contact the [facility] Attending or Consulting provider [place] or covering provider during after hours [place], for this patient?  1. Check the care team in [facility] and look for a) attending/consulting TR[facility] provider listed and b) the [facility] team listed 2. Log into [email or web address] and use [place]'s universal password to access. If you do not have the password, please contact the hospital operator. 3. Locate the [facility] provider you are looking for under [facility] and page to a number that you can be directly reached. 4. If you still have difficulty reaching the provider, please page the [facility] (Director on Call) for the Hospitalists listed on amion for assistance.

## 2020-05-02 NOTE — Evaluation (Signed)
Clinical/Bedside Swallow Evaluation Patient Details  Name: Terry Page. MRN: [id number] Date of Birth: May 11, 1955  Today's Date: 05/02/2020 Time: SLP Start Time (ACUTE ONLY): 1427 SLP Stop Time (ACUTE ONLY): 1450 SLP Time Calculation (min) (ACUTE ONLY): 23 min  Past Medical History:  Past Medical History:  Diagnosis Date  . Alcohol abuse   . Asthma   . Bullous emphysema ([place])   . Essential hypertension 04/10/2020  . Hemorrhoids   . Incidental lung nodule, greater than or equal to 63mm 10/22/2017   Left upper lobe - discovered on CTA  . Prostate cancer ([place])   . Spontaneous pneumothorax 10/20/2017   right  . Tobacco abuse    Past Surgical History:  Past Surgical History:  Procedure Laterality Date  . BACK SURGERY    . 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 Course complicated by SBO, improved with transient NG tube decompression.  Developed right upper lobe air-fluid levels/necrotizing pneumonia.  Transferred to ICU 4/30 for respiratory distress requiring BiPAP. Received consult given concern for potential aspiration event. Pt began coughing while eating eggs/grits this am.   Assessment / Plan / Recommendation Clinical Impression  Pt presented with grossly functional swallowing. Oral mechanism exam was notable for flattened nasolabial fold and asymmetric smile on the right.  Tongue was midline. There was adequate mastication of solids, the appearance of a brisk swallow response, and no overt s/s of aspiration when swallowing liquids/solids. There appeared to be adequate coordination of breathing/swallowing cycles.  After completion of the assessment, he demonstrated ongoing throat-clearing.  He verbalized concerns about coughing episode this am with breakfast and is reluctant to resume an oral diet.  Recommend continuing dysphagia 2, thin liquids for now.  SLP will f/u for safety and to  determine if pt may benefit from an instrumental swallow study. D/W RN.    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 Course complicated by SBO, improved with transient NG tube decompression.  Developed right upper lobe air-fluid levels/necrotizing pneumonia.  Transferred to ICU 4/30 for respiratory distress requiring BiPAP. Received consult given concern for potential aspiration event. Type of Study: Bedside Swallow Evaluation Previous Swallow Assessment: no Diet Prior to this Study: NPO Temperature Spikes Noted: No Respiratory Status: Nasal cannula (2l) History of Recent Intubation: No Behavior/Cognition: Alert;Cooperative Oral Cavity Assessment: Within Functional Limits Oral Care Completed by SLP: No Oral Cavity - Dentition: Poor condition Vision: Functional for self-feeding Self-Feeding Abilities: Able to feed self Patient Positioning: Upright in bed Baseline Vocal Quality: Hoarse Volitional Cough: Strong Volitional Swallow: Able to elicit    Oral/Motor/Sensory Function Overall Oral Motor/Sensory Function: Within functional limits   Ice Chips Ice chips: Within functional limits   Thin Liquid Thin Liquid: Within functional limits    Nectar Thick Nectar Thick Liquid: Not tested   Honey Thick Honey Thick Liquid: Not tested  Puree Puree: Within functional limits   Solid     Solid: Within functional limits      Terry Page 05/02/2020,2:59  PM  Terry Page, [place] Office number [phone] Pager [phone]

## 2020-05-03 DIAGNOSIS — J850 Gangrene and necrosis of lung: Secondary | ICD-10-CM | POA: Diagnosis not present

## 2020-05-03 DIAGNOSIS — J9601 Acute respiratory failure with hypoxia: Secondary | ICD-10-CM | POA: Diagnosis not present

## 2020-05-03 DIAGNOSIS — J439 Emphysema, unspecified: Secondary | ICD-10-CM | POA: Diagnosis not present

## 2020-05-03 LAB — BASIC METABOLIC PANEL
Anion gap: 7 (ref 5–15)
BUN: 19 mg/dL (ref 8–23)
CO2: 25 mmol/L (ref 22–32)
Calcium: 8.8 mg/dL — ABNORMAL LOW (ref 8.9–10.3)
Chloride: 100 mmol/L (ref 98–111)
Creatinine, Ser: 0.45 mg/dL — ABNORMAL LOW (ref 0.61–1.24)
GFR, Estimated: >60 mL/min (ref >60)
Glucose, Bld: 112 mg/dL — ABNORMAL HIGH (ref 70–99)
Potassium: 3.4 mmol/L — ABNORMAL LOW (ref 3.5–5.1)
Sodium: 132 mmol/L — ABNORMAL LOW (ref 135–145)

## 2020-05-03 MED ORDER — POTASSIUM CHLORIDE 20 MEQ PO PACK
40.0000 meq | PACK | ORAL | Status: AC
Start: 2020-05-03 — End: 2020-05-03
  Administered 2020-05-03 (×2): 40 meq via ORAL
  Filled 2020-05-03 (×2): qty 2

## 2020-05-03 NOTE — Progress Notes (Signed)
Name: Terry Page. 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  Transferred to ICU 4/30 for respiratory distress requiring BiPAP  SIGNIFICANT EVENTS  4/30 sudden respiratory distress , saturation 84% on 5 L >> bipap 5/1 SLP eval > dysphagia 2, thin liquids  STUDIES:  Blood culture 4/9 staph hominis 1/2 Blood culture 4/23 strep mitis 1/2 CT chest with contrast 4/29 >> development of cavitary areas in the right upper lobe with multiple air-fluid levels -new compared to 4/9, airspace disease in right middle and bilateral lower lobes, small right effusion, is spiculated 11 mm left upper lobe nodule CTA chest 4/9 right upper lobe consolidation, extensive changes of bullous emphysema, left upper lobe spiculated nodule   HISTORY OF PRESENT ILLNESS:    65 year old smoker with bullous emphysema admitted 4/9 with shortness of breath found to have right upper lobe patchy infiltrates and COVID-positive.  He is unvaccinated.  He was treated with 5 days of remdesivir and 10 days of steroids, ceftriaxone and azithromycin for 5 days.  He was found to have staph hominis bacteremia 1/2 and was treated with an additional 2 days of cefazolin to complete a 7-day course.  TTE did not show any evidence of vegetation. Course was complicated by small bowel obstruction noted on CT abdomen/pelvis which improved with transient NG tube decompression He had recurrent fever 4/23, MRI did not show any evidence of discitis , respiratory status worsened 4/26 when he started requiring 3 L nasal cannula.  Treated for bronchospasm with IV Solu-Medrol and  bronchodilators CT chest was obtained on 4/29 which showed air-fluid levels in the right upper lobe, hence we are consulted for evaluation of necrotizing pneumonia On 4/30 developed sudden respiratory distress , saturation 84% on 5 L, placed on nonrebreather and due to work of breathing eventually placed on BiPAP and transferred to the ICU  PAST MEDICAL HISTORY :   05/2019 spontaneous right pneumothorax treated with chest tube History of spontaneous pneumothorax 2019 Prostate cancer status post IMRT 2020 Left upper lobe nodule treated with SBRT 09/4707 Hypermetabolic left upper lobe nodule status post SBRT 04/2020    SUBJECTIVE: Feels a bit better.  Comfortable on 4-5L O2.  VITAL SIGNS: Temp:  [97.6 F (36.4 C)-98.1 F (36.7 C)] 97.6 F (36.4 C) (05/02 0800) Pulse Rate:  [67-94] 72 (05/02 0600) Resp:  [14-24] 16 (05/02 0600) BP: (112-149)/(64-87) 149/77 (05/02 0600) SpO2:  [96 %-100 %] 96 % (05/02 0729)  PHYSICAL EXAMINATION: General: Adult male, resting in bed, in NAD. Neuro: A&O x 3, non-focal. HEENT: [place]/AT. Sclerae anicteric. EOMI. Cardiovascular: RRR, no M/R/G.  Lungs: Respirations even and unlabored.  CTA bilaterally, No W/R/R.  Abdomen: BS x 4, soft, NT/ND.  Musculoskeletal: No gross deformities, no edema.  Skin: Intact, warm, no rashes.   ASSESSMENT / PLAN:  Acute hypoxic respiratory failure - not sure what triggered his acute decompensation on 4/30, he denies previous aspiration, SLP cleared him for dysphagia 2 with thin liquids.   - Continue supplemental O2 as needed to maintain SpO2 > 90%. - Continue solumedrol through today, change to prednisone 5/3 with rapid 1 week taper.  Acute necrotizing pneumonia right upper lobe, seems to have been concurrent  with COVID illness, on review of imaging this appears to be more due to infected bulla rather than cavitation, with air-fluid level. - Agree with Unasyn and vancomycin currently, will need long course of antibiotics  sometimes 4 to 6 weeks until bulla fully clears up.  Can use Augmentin once acute phase improved. - Continue hypertonic saline and bronchial hygiene. - Mobilize as able.  Will need outpatient follow-up to resolution, if refractory, can consider bronchoscopy however would be high risk of pneumothorax in the setting with bullous disease  Rest per primary team.   Montey Hora, PA - [facility] For pager details, please see AMION or use Epic chat  After 1900, please call [place] for cross coverage needs 05/03/2020, 8:54 AM

## 2020-05-03 NOTE — Progress Notes (Addendum)
Occupational Therapy Treatment Patient Details Name: Terry Page. MRN: [id number] DOB: 26-May-1955 Today's Date: 05/03/2020    History of present illness 65 yo male presents with complaints of shortness of breath. Pt tested positive for COVID 04/10/20.   on 4/15 pt developed nausea and vomiting and abdominal distention.  A CT scan was ordered.  This shows a small bowel obstruction with transition point in the distal small bowel  VOH:YWVP with both emphysema, spontaneous pneumothorax (2019 and 2021), SIADH, HTN, hyperlipidemia, prostate cancer and left lung cancer (currently receiving radiation therapy).   OT comments  Unfortuentaly, pt has regressed since initial Evaluation due to change in medical status necessitating transfer to ICU as well as multiple refusals for OT treatments and no OT since 04/22/2020. Patient with decreased 6-Clicks AM-PAC score of Occupational Performance from a 19/24 last session to a 16/24 this session indicating a 53.32% ADL impairment.  Patient remains self-limiting and was educated on importance of pacing self while participating in therapy ad lib as well as upright positioning in chair for general health.  Pt's goals have been updated to reflect current needs and all remain unmet and active.  Pt continues to demonstrate fair rehab potential and would benefit from continued skilled OT to increase safety and independence with ADLs and functional transfers to allow pt to return home safely and reduce caregiver burden and fall risk.   Follow Up Recommendations  SNF    Equipment Recommendations       Recommendations for Other Services      Precautions / Restrictions Precautions Precautions: Fall Precaution Comments: Monitor O2 and HR Restrictions Weight Bearing Restrictions: No       Mobility Bed Mobility Overal bed mobility: Needs Assistance Bed Mobility: Supine to Sit     Supine to sit: Minimal assist/HHA;HOB elevated       Patient Response: Flat  affect  Transfers Overall transfer level: Needs assistance   Transfers: Sit to/from Stand;Stand Pivot Transfers Sit to Stand: Min guard Stand pivot transfers: Min assist       General transfer comment: Unsteady with pivot, and reached for IV pole to steady himself.    Balance Overall balance assessment: Needs assistance Sitting-balance support: No upper extremity supported;Feet supported Sitting balance-Leahy Scale: Good     Standing balance support: Single extremity supported Standing balance-Leahy Scale: Poor Standing balance comment: During pivot to chair                           ADL either performed or assessed with clinical judgement   ADL Overall ADL's : Needs assistance/impaired       Grooming Details (indicate cue type and reason): Pt refused all ADLs stating that getting up to the chair was enough activity. Pt educated on importance of oral hygiene. Pt stated that he brushed earlier today however teeth looked rather caked.                             Functional mobility during ADLs: Minimal assistance (Pt holding IV pole PRN)       Vision Baseline Vision/History: Wears glasses Wears Glasses: At all times Patient Visual Report: No change from baseline     Perception     Praxis      Cognition Arousal/Alertness: Awake/alert Behavior During Therapy: Flat affect Overall Cognitive Status: Within Functional Limits for tasks assessed  General Comments: Pt is particular re: how things are done and does not like to be pushed.  Therapy has to be rather patient led.        Exercises Other Exercises Other Exercises: Energy conservation handout provided. Brief education on pacing and doing whatever pt can with therapy to avoid missed visits. RN to room and pt wanted to speak with RN privately so further energy conservation education was truncated.   Shoulder Instructions       General  Comments      Pertinent Vitals/ Pain       Pain Assessment: 0-10 Pain Score: 8  Pain Location: Back Pain Descriptors / Indicators: Discomfort Pain Intervention(s): Repositioned;Limited activity within patient's tolerance (Offered to alert nurse, but pt declined and stated that he had a private matter to discuss with her. Nurse later to room.)  Home Living                                          Prior Functioning/Environment              Frequency  Min 2X/week        Progress Toward Goals  OT Goals(current goals can now be found in the care plan section)  Progress towards OT goals: Not progressing toward goals - comment (Pt with refusals for OT treatment since 04/27/2020.)  Acute Rehab OT Goals Patient Stated Goal: Not to overdo it. OT Goal Formulation: With patient Time For Goal Achievement: 05/17/20 Potential to Achieve Goals: Fair (Good if pt agrees to participate with OT when offered.)  Plan Discharge plan remains appropriate    Co-evaluation                 AM-PAC OT "6 Clicks" Daily Activity     Outcome Measure   Help from another person eating meals?: None Help from another person taking care of personal grooming?: A Little Help from another person toileting, which includes using toliet, bedpan, or urinal?: A Lot Help from another person bathing (including washing, rinsing, drying)?: A Lot Help from another person to put on and taking off regular upper body clothing?: A Little Help from another person to put on and taking off regular lower body clothing?: A Lot 6 Click Score: 16    End of Session Equipment Utilized During Treatment: Gait belt;Oxygen  OT Visit Diagnosis: Unsteadiness on feet (R26.81);Muscle weakness (generalized) (M62.81)   Activity Tolerance Patient limited by fatigue;Patient limited by pain   Patient Left with call bell/phone within reach;with nursing/sitter in room;in chair   Nurse Communication Other  (comment) (Pt wanted RN to room.)        Time: 1791-5056 OT Time Calculation (min): 30 min  Charges: OT General Charges $OT Visit: 1 Visit OT Treatments $Therapeutic Activity: 23-37 mins  Anderson [place], [place] Office: [phone] 05/03/2020   Julien Girt 05/03/2020, 12:44 PM

## 2020-05-03 NOTE — TOC Initial Note (Signed)
Transition of Care (TOC) - Initial/Assessment Note    Patient Details  Name: Terry Page. MRN: [id number] Date of Birth: 06-16-1955  Transition of Care 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  Transferred to ICU 4/30 for respiratory distress requiring BiPAP  SIGNIFICANT EVENTS  4/30 sudden respiratory distress , saturation 84% on 5 L >> bipap  STUDIES:  Blood culture 4/9 staph hominis 1/2 Blood culture 4/23 strep mitis 1/2  CT chest with contrast 4/29 >> development of cavitary areas in the right upper lobe with multiple air-fluid levels -new compared to 4/9, airspace disease in right middle and bilateral lower lobes, small right effusion, is spiculated 11 mm left upper lobe nodule  CTA chest 4/9 right upper lobe consolidation, extensive changes of bullous emphysema, left upper lobe spiculated nodule PLAN: folloeing for toc needs lives alone separated spouse is contact.  Expected Discharge Plan: Home/Self Care Barriers to Discharge: Continued Medical Work up   Patient Goals and CMS Choice Patient states their goals for this hospitalization and ongoing recovery are:: to go home CMS Medicare.gov Compare Post Acute Care list provided to:: Patient Choice offered to / list presented to : Patient  Expected Discharge Plan and Services Expected Discharge Plan: Home/Self Care   Discharge Planning Services: CM Consult   Living arrangements for the past 2 months: Apartment                                      Prior Living Arrangements/Services Living arrangements for the past 2 months: Apartment Lives with:: Self Patient language and need for interpreter reviewed:: Yes Do you feel safe going back to the  place where you live?: Yes      Need for Family Participation in Patient Care: No (Comment) Care giver support system in place?: No (comment)   Criminal Activity/Legal Involvement Pertinent to Current Situation/Hospitalization: No - Comment as needed  Activities of Daily Living Home Assistive Devices/Equipment: None ADL Screening (condition at time of admission) Patient's cognitive ability adequate to safely complete daily activities?: Yes Is the patient deaf or have difficulty hearing?: No Does the patient have difficulty seeing, even when wearing glasses/contacts?: No Does the patient have difficulty concentrating, remembering, or making decisions?: No Patient able to express need for assistance with ADLs?: Yes Does the patient have difficulty dressing or bathing?: No Independently performs ADLs?: Yes (appropriate for developmental age) Does the patient have difficulty walking or climbing stairs?: Yes Weakness of Legs: None Weakness of Arms/Hands: None  Permission Sought/Granted                  Emotional Assessment Appearance:: Appears stated age Attitude/Demeanor/Rapport: Engaged Affect (typically observed): Calm Orientation: : Oriented to Place,Oriented to Self,Oriented to  Time,Oriented to Situation Alcohol / Substance Use: Not Applicable Psych Involvement: No (comment)  Admission diagnosis:  COVID-19 virus infection [U07.1] Acute respiratory failure with hypoxia ([place]) [J96.01] Patient Active Problem List   Diagnosis Date Noted  . Bullous emphysema ([place])   . Acute respiratory failure with hypoxia ([place]) 04/12/2020  . COVID-19 virus infection 04/10/2020  . Primary cancer of left upper lobe  of lung ([place]) 04/10/2020  . Cavitary pneumonia 04/10/2020  . SIADH (syndrome of inappropriate ADH production) ([place]) 04/10/2020  . Nicotine dependence 04/10/2020  . Essential hypertension 04/10/2020  . Mixed hyperlipidemia 04/10/2020  . Sepsis ([place]) 04/10/2020  . Recurrent  spontaneous pneumothorax 06/01/2019  . Malignant neoplasm of prostate ([place]) 05/20/2019  . Hyponatremia 12/04/2017  . AKI (acute kidney injury) ([place]) 12/04/2017  . Incidental lung nodule, greater than or equal to 52mm 10/22/2017  . COPD with acute exacerbation ([place])   . Spontaneous pneumothorax 10/20/2017  . Tobacco abuse   . Alcohol abuse    PCP:  [name], MD Pharmacy:   [facility] [place], [place] - [address] [place] [address] Phone: [phone] Fax: [phone]     Social Determinants of Health (SDOH) Interventions    Readmission Risk Interventions Readmission Risk Prevention Plan 10/24/2017  Post Dischage Appt Complete  Medication Screening Complete  Transportation Screening Complete  PCP follow-up Complete  Some recent data might be hidden

## 2020-05-03 NOTE — Progress Notes (Signed)
o2 decreased to 5lpm cann

## 2020-05-03 NOTE — Progress Notes (Signed)
PROGRESS NOTE  Terry Page. OVF:[id number] DOB: 11-03-1955 DOA: 04/10/2020 PCP: [name], MD  Brief History   65 year old man PMH COPD, SIADH, prostate cancer, left lung cancer on XRT presented with shortness of breath 4/9.  Admitted for acute hypoxic respiratory failure secondary to right upper lobe bacterial pneumonia and concurrent COVID-19 infection.  Subsequently found to have staph hominis bacteremia.  Successfully treated and recovered, isolation removed.  Then developed small bowel obstruction which resolved spontaneously, followed by general surgery.  Subsequently developed COPD exacerbation and healthcare associated pneumonia.  Failed to improve, CT chest showed infected bulla, multilobar pneumonia.  Condition worsened, required BiPAP, transferred to stepdown.  Seen by infectious disease and pulmonology.  Transitioned to oral antibiotics.  Subsequently noted to have choking episodes with food.  Seen by speech therapy and diet advanced.  Now improving.  Potential discharge to SNF in 48 hours.  A & P  Acute hypoxic respiratory failure secondary to multilobar pneumonia, infected bulla, necrotizing pneumonia. --Much better today.  Wean oxygen as tolerated.  Continue Augmentin. --Follow-up with pulmonology as an outpatient.  Dysphagia.   --Diet as per speech therapy  Acute COPD exacerbation, chronic bullous emphysema, PMH 2 spontaneous pneumothoraces -- COPD stable  Primary cancer left upper lobe lung treated with XRT.  CT abdomen pelvis 4/15 raise concern new cavitating nodule left lower lobe as well as subpleural nodule left lower lobe.  Possible additional nodules right lung. --Close outpatient follow-up with radiation oncology after discharge  Chronic leukocytosis --Etiology unclear, seen June and May 2021 as well as throughout this hospitalization.  SBO, resolved --Seen 4/15 and CT abdomen pelvis, followed by general surgery, resolved spontaneously  Abdominal pain.    --Resolved.  COVID-pneumonia treated with remdesivir and steroids, completed isolation 4/19 per chart  Sepsis present on admission, bacterial pneumonia, treated with azithromycin and ceftriaxone  Chronic SIADH --stable  Anemia of chronic disease, stable.  Prostate cancer status post XRT Aortic atherosclerosis --no tx indicated  Staph hominis bacteremia 4/9, unclear source, lacks indwelling hardware, lines, cardiac devices. TTE no evidence of valvular vegetation.  Seen by ID, completed cefazolin total 7 days from complicated coag negative staph bacteremia.  1/2 staph mitis contaminant --No further evaluation suggested    Disposition Plan:  Discussion: Continues to improve.  Continue antibiotics, monitor diet.  Wean oxygen.  Hopefully can transfer to SNF in 48 hours.  Status is: Inpatient  Remains inpatient appropriate because:IV treatments appropriate due to intensity of illness or inability to take PO and Inpatient level of care appropriate due to severity of illness   Dispo:  Patient From: Home  Planned Disposition: [place]  Medically stable for discharge: No     DVT prophylaxis: enoxaparin (LOVENOX) injection 40 mg Start: 04/10/20 2200   Code Status: Full Code Level of care: Stepdown Family Communication: none  Murray Hodgkins, MD  [facility] Direct contact: see [email or web address] (further directions at bottom of note if needed) 7PM-7AM contact night coverage as at bottom of note 05/03/2020, 4:19 PM  LOS: 21 days   Significant Hospital Events   . 4/9 admit for pneumonia . 4/16 SBO . 4/29 CT chest: severe RUL disease w/ multiple fluid levels, consider necrotizing pneumonia; multilobar pneumonia   Consults:  . ID . General surgery . Pulmonology    Antimicrobials:  Ceftriaxone 4/9 > 4/13 Azithromycin 4/9 > 4/13 Cefazolin 4/14 > 4/15 Vancomycin 4/11 > 4/13 + 4/25 > 4/29, 4/30   Cefepime 4/25 > 4/30 Augmentin 5/1 >  Interval  History/Subjective  CC: f/u SOB  Feels better today, bowels moved yesterday, breathing better, tolerating diet. No abd pain  Objective   Vitals:  Vitals:   05/03/20 1430 05/03/20 1500  BP:  118/79  Pulse:  94  Resp:  (!) 24  Temp:    SpO2: 99% 99%    Exam:  Constitutional:   . Appears calm and comfortable ENMT:  . grossly normal hearing  Respiratory:  . CTA bilaterally, no w/r/r.  . Respiratory effort normal. Cardiovascular:  . RRR, no m/r/g . No LE extremity edema   Abdomen:  . Soft ntnd Psychiatric:  . Mental status o Mood, affect appropriate  I have personally reviewed the following:   Today's Data  . K+ 3.4 . EKG independent review SR  Scheduled Meds: . amLODipine  10 mg Oral Daily  . amoxicillin-clavulanate  1 tablet Oral Q12H  . aspirin EC  81 mg Oral Daily  . chlorhexidine  15 mL Mouth Rinse BID  . Chlorhexidine Gluconate Cloth  6 each Topical Daily  . enoxaparin (LOVENOX) injection  40 mg Subcutaneous Q24H  . guaiFENesin  600 mg Oral BID  . ipratropium-albuterol  3 mL Nebulization QID  . irbesartan  300 mg Oral Daily  . mouth rinse  15 mL Mouth Rinse q12n4p  . methylPREDNISolone (SOLU-MEDROL) injection  40 mg Intravenous Q24H  . polyethylene glycol  17 g Oral BID  . senna  1 tablet Oral QHS  . sodium chloride HYPERTONIC  4 mL Nebulization TID   Continuous Infusions:   Principal Problem:   Acute respiratory failure with hypoxia ([name]) Active Problems:   COPD with acute exacerbation ([place])   COVID-19 virus infection   Primary cancer of left upper lobe of lung ([name])   Cavitary pneumonia   SIADH (syndrome of inappropriate ADH production) ([place])   Essential hypertension   Mixed hyperlipidemia   Sepsis ([place])   Bullous emphysema ([place])   LOS: 21 days   How to contact the [facility] Attending or Consulting provider [place] or covering provider during after hours [place], for this patient?  1. Check the care team in [facility] and look for a)  attending/consulting TRH provider listed and b) the [facility] team listed 2. Log into [email or web address] and use [place]'s universal password to access. If you do not have the password, please contact the hospital operator. 3. Locate the [facility] provider you are looking for under [facility] and page to a number that you can be directly reached. 4. If you still have difficulty reaching the provider, please page the [facility] (Director on Call) for the Hospitalists listed on amion for assistance.

## 2020-05-03 NOTE — Progress Notes (Signed)
PT Cancellation Note  Patient Details Name: Terry Page. MRN: [id number] DOB: 1955/06/22   Cancelled Treatment:    Reason Eval/Treat Not Completed: Patient declined, up in recliner, wants to eat lunch. Provided theraband and reviewed UE exersises for him to perform later. Will check back tomorrow.   Claretha Cooper 05/03/2020, 2:20 PM  [place] Pager [phone] Office [phone]

## 2020-05-03 NOTE — Progress Notes (Signed)
  Speech Language Pathology Treatment: Dysphagia  Patient Details Name: Terry Page. MRN: [id number] DOB: [birth year] Today's Date: 05/03/2020 Time: 4098-1191 SLP Time Calculation (min) (ACUTE ONLY): 15 min  Assessment / Plan / Recommendation Clinical Impression  Patient seen to address dysphagia goals with focus of session on observing patient with cup sips of thin liquids and education and discussion with patient regarding diet textures. Patient exhibited mild intensity and frequency of throat clearing however no significant change in vocal quality. Patient did not have any c/o swallowing difficulty today. SLP spent significant amount of time explaining difference between Dys 2 and Dys 3 solid textures as patient initially did not seem to understand difference. Patient ultimately in agreement to try upgrade of solids from Dys 2 to Dys 3.   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 Course complicated by SBO, improved with transient NG tube decompression.  Developed right upper lobe air-fluid levels/necrotizing pneumonia.  Transferred to ICU 4/30 for respiratory distress requiring BiPAP. Received consult given concern for potential aspiration event.      SLP Plan  Continue with current plan of care       Recommendations  Diet recommendations: Dysphagia 3 (mechanical soft);Thin liquid Liquids provided via: Cup;Straw Medication Administration: Whole meds with puree Supervision: Patient able to self feed;Intermittent supervision to cue for compensatory strategies Compensations: Slow rate;Small sips/bites Postural Changes and/or Swallow Maneuvers: Seated upright 90 degrees;Upright 30-60 min after meal                Oral Care Recommendations: Oral care BID Follow up Recommendations: Other (comment) (TBD) SLP Visit Diagnosis: Dysphagia, unspecified (R13.10) Plan: Continue with current plan of care        GO              Sonia Baller, MA, CCC-SLP Speech Therapy

## 2020-05-03 NOTE — Progress Notes (Signed)
Pt refuse to wear BIPAP tonight. Machine on standby

## 2020-05-03 NOTE — Progress Notes (Signed)
Subjective: Complaining of gas and abdominal bloating   Antibiotics:  Anti-infectives (From admission, onward)   Start     Dose/Rate Route Frequency Ordered Stop   05/01/20 2200  vancomycin (VANCOREADY) IVPB 1000 mg/200 mL  Status:  Discontinued        1,000 mg 200 mL/hr over 60 Minutes Intravenous Every 12 hours 05/01/20 0903 05/01/20 1607   05/01/20 2200  amoxicillin-clavulanate (AUGMENTIN) 875-125 MG per tablet 1 tablet        1 tablet Oral Every 12 hours 05/01/20 1701     05/01/20 1000  vancomycin (VANCOREADY) IVPB 1250 mg/250 mL        1,250 mg 166.7 mL/hr over 90 Minutes Intravenous  Once 05/01/20 0903 05/01/20 1143   04/30/20 1800  Ampicillin-Sulbactam (UNASYN) 3 g in sodium chloride 0.9 % 100 mL IVPB  Status:  Discontinued        3 g 200 mL/hr over 30 Minutes Intravenous Every 6 hours 04/30/20 1555 05/01/20 1701   04/27/20 0600  vancomycin (VANCOREADY) IVPB 1000 mg/200 mL  Status:  Discontinued        1,000 mg 200 mL/hr over 60 Minutes Intravenous Every 12 hours 04/26/20 1329 04/30/20 1037   04/26/20 2200  ceFEPIme (MAXIPIME) 2 g in sodium chloride 0.9 % 100 mL IVPB  Status:  Discontinued        2 g 200 mL/hr over 30 Minutes Intravenous Every 8 hours 04/26/20 1329 04/30/20 1545   04/26/20 1400  vancomycin (VANCOREADY) IVPB 1500 mg/300 mL        1,500 mg 150 mL/hr over 120 Minutes Intravenous  Once 04/26/20 1306 04/26/20 2008   04/26/20 1400  ceFEPIme (MAXIPIME) 2 g in sodium chloride 0.9 % 100 mL IVPB        2 g 200 mL/hr over 30 Minutes Intravenous  Once 04/26/20 1306 04/26/20 1536   04/15/20 1400  ceFAZolin (ANCEF) IVPB 2g/100 mL premix        2 g 200 mL/hr over 30 Minutes Intravenous Every 8 hours 04/15/20 0922 04/17/20 0000   04/13/20 0600  vancomycin (VANCOREADY) IVPB 1250 mg/250 mL  Status:  Discontinued        1,250 mg 166.7 mL/hr over 90 Minutes Intravenous Every 12 hours 04/12/20 1347 04/15/20 0922   04/12/20 1430  vancomycin (VANCOREADY) IVPB 1500  mg/300 mL        1,500 mg 150 mL/hr over 120 Minutes Intravenous  Once 04/12/20 1342 04/12/20 1702   04/11/20 2000  cefTRIAXone (ROCEPHIN) 2 g in sodium chloride 0.9 % 100 mL IVPB  Status:  Discontinued        2 g 200 mL/hr over 30 Minutes Intravenous Every 24 hours 04/10/20 2119 04/15/20 0922   04/11/20 1000  remdesivir 100 mg in sodium chloride 0.9 % 100 mL IVPB       "Followed by" Linked Group Details   100 mg 200 mL/hr over 30 Minutes Intravenous Daily 04/10/20 2011 04/14/20 1100   04/10/20 2200  remdesivir 200 mg in sodium chloride 0.9% 250 mL IVPB       "Followed by" Linked Group Details   200 mg 580 mL/hr over 30 Minutes Intravenous Once 04/10/20 2011 04/11/20 0002   04/10/20 1915  cefTRIAXone (ROCEPHIN) 2 g in sodium chloride 0.9 % 100 mL IVPB  Status:  Discontinued        2 g 200 mL/hr over 30 Minutes Intravenous Every 24 hours 04/10/20 1909 04/10/20 2119   04/10/20  1915  azithromycin (ZITHROMAX) 500 mg in sodium chloride 0.9 % 250 mL IVPB  Status:  Discontinued        500 mg 250 mL/hr over 60 Minutes Intravenous Every 24 hours 04/10/20 1909 04/15/20 6546      Medications: Scheduled Meds: . amLODipine  10 mg Oral Daily  . amoxicillin-clavulanate  1 tablet Oral Q12H  . aspirin EC  81 mg Oral Daily  . chlorhexidine  15 mL Mouth Rinse BID  . Chlorhexidine Gluconate Cloth  6 each Topical Daily  . enoxaparin (LOVENOX) injection  40 mg Subcutaneous Q24H  . guaiFENesin  600 mg Oral BID  . ipratropium-albuterol  3 mL Nebulization QID  . irbesartan  300 mg Oral Daily  . mouth rinse  15 mL Mouth Rinse q12n4p  . methylPREDNISolone (SOLU-MEDROL) injection  40 mg Intravenous Q24H  . polyethylene glycol  17 g Oral BID  . potassium chloride  40 mEq Oral Q4H  . senna  1 tablet Oral QHS  . sodium chloride HYPERTONIC  4 mL Nebulization TID   Continuous Infusions: PRN Meds:.acetaminophen, albuterol, alum & mag hydroxide-simeth, guaiFENesin-dextromethorphan, melatonin, morphine  injection, ondansetron **OR** ondansetron (ZOFRAN) IV, phenol, sodium chloride    Objective: Weight change:   Intake/Output Summary (Last 24 hours) at 05/03/2020 1024 Last data filed at 05/03/2020 0930 Gross per 24 hour  Intake 2253.51 ml  Output 650 ml  Net 1603.51 ml   Blood pressure (!) 149/77, pulse 72, temperature 97.6 F (36.4 C), temperature source Oral, resp. rate 16, height 5\' 9"  (1.753 m), weight 69.3 kg, SpO2 96 %. Temp:  [97.6 F (36.4 C)-98.1 F (36.7 C)] 97.6 F (36.4 C) (05/02 0800) Pulse Rate:  [67-91] 72 (05/02 0600) Resp:  [14-24] 16 (05/02 0600) BP: (112-149)/(64-87) 149/77 (05/02 0600) SpO2:  [96 %-100 %] 96 % (05/02 0729)  Physical Exam: Physical Exam Constitutional:      Appearance: He is ill-appearing.  HENT:     Head: Normocephalic.  Eyes:     Extraocular Movements: Extraocular movements intact.  Cardiovascular:     Rate and Rhythm: Tachycardia present.     Heart sounds: No murmur heard. No friction rub. No gallop.   Pulmonary:     Effort: No respiratory distress.     Breath sounds: No stridor. Examination of the right-upper field reveals decreased breath sounds. Decreased breath sounds present.  Abdominal:     General: There is distension.     Palpations: There is no mass.     Tenderness: There is no abdominal tenderness. There is no rebound.  Musculoskeletal:     Cervical back: Normal range of motion.  Skin:    General: Skin is warm.  Neurological:     General: No focal deficit present.     Mental Status: He is alert.  Psychiatric:        Attention and Perception: Attention normal.        Mood and Affect: Mood is depressed.        Behavior: Behavior normal.        Thought Content: Thought content normal.        Judgment: Judgment normal.      CBC:    BMET Recent Labs    05/02/20 0221 05/03/20 0240  NA 137 132*  K 4.5 3.4*  CL 103 100  CO2 25 25  GLUCOSE 151* 112*  BUN 16 19  CREATININE 0.53* 0.45*  CALCIUM 9.0 8.8*      Liver Panel  No results  for input(s): PROT, ALBUMIN, AST, ALT, ALKPHOS, BILITOT, BILIDIR, IBILI in the last 72 hours.     Sedimentation Rate No results for input(s): ESRSEDRATE in the last 72 hours. C-Reactive Protein No results for input(s): CRP in the last 72 hours.  Micro Results: Recent Results (from the past 720 hour(s))  Resp Panel by RT-PCR (Flu A&B, Covid) Nasopharyngeal Swab     Status: Abnormal   Collection Time: 04/10/20  5:35 PM   Specimen: Nasopharyngeal Swab; Nasopharyngeal(NP) swabs in vial transport medium  Result Value Ref Range Status   SARS Coronavirus 2 by RT PCR POSITIVE (A) NEGATIVE Final    Comment: RESULT CALLED TO, READ BACK BY AND VERIFIED WITH: JOHN FRICKUY RN04/09/22 @1916  BY P.HENDERSON (NOTE) SARS-CoV-2 target nucleic acids are DETECTED.  The SARS-CoV-2 RNA is generally detectable in upper respiratory specimens during the acute phase of infection. Positive results are indicative of the presence of the identified virus, but do not rule out bacterial infection or co-infection with other pathogens not detected by the test. Clinical correlation with patient history and other diagnostic information is necessary to determine patient infection status. The expected result is Negative.  Fact Sheet for Patients: EntrepreneurPulse.com.au  Fact Sheet for Healthcare Providers: IncredibleEmployment.be  This test is not yet approved or cleared by the [place] FDA and  has been authorized for detection and/or diagnosis of SARS-CoV-2 by FDA under an Emergency Use Authorization (EUA).  This EUA will remain in effect (meaning this  test can be used) for the duration of  the COVID-19 declaration under Section 564(b)(1) of the Act, 21 U.S.C. section 360bbb-3(b)(1), unless the authorization is terminated or revoked sooner.     Influenza A by PCR NEGATIVE NEGATIVE Final   Influenza B by PCR NEGATIVE NEGATIVE Final     Comment: (NOTE) The Xpert Xpress SARS-CoV-2/FLU/RSV plus assay is intended as an aid in the diagnosis of influenza from Nasopharyngeal swab specimens and should not be used as a sole basis for treatment. Nasal washings and aspirates are unacceptable for Xpert Xpress SARS-CoV-2/FLU/RSV testing.  Fact Sheet for Patients: EntrepreneurPulse.com.au  Fact Sheet for Healthcare Providers: IncredibleEmployment.be  This test is not yet approved or cleared by the [place] FDA and has been authorized for detection and/or diagnosis of SARS-CoV-2 by FDA under an Emergency Use Authorization (EUA). This EUA will remain in effect (meaning this test can be used) for the duration of the COVID-19 declaration under Section 564(b)(1) of the Act, 21 U.S.C. section 360bbb-3(b)(1), unless the authorization is terminated or revoked.  Performed at [facility], [place] [address] [place], [place] [address]   Urine culture     Status: Abnormal   Collection Time: 04/10/20  8:51 PM   Specimen: In/Out Cath Urine  Result Value Ref Range Status   Specimen Description   Final    IN/OUT CATH URINE Performed at [facility] [address], [place], [place] [address]    Special Requests   Final    NONE Performed at [facility], [place] [address] [place], [place] [address]    Culture (A)  Final    <10,000 COLONIES/mL INSIGNIFICANT GROWTH Performed at [place] [facility] [address] [place], [place] [address]    Report Status 04/12/2020 FINAL  Final  Culture, blood (single)     Status: Abnormal   Collection Time: 04/10/20  8:53 PM   Specimen: BLOOD  Result Value Ref Range Status   Specimen Description   Final  BLOOD LEFT ANTECUBITAL Performed at [facility] [address], [place], [place] [address]    Special Requests   Final    BOTTLES DRAWN AEROBIC AND ANAEROBIC Blood Culture  adequate volume Performed at [place] [address], [place], [place] [address]    Culture  Setup Time   Final    GRAM POSITIVE COCCI IN BOTH AEROBIC AND ANAEROBIC BOTTLES CRITICAL RESULT CALLED TO, READ BACK BY AND VERIFIED WITH: PHARMD ELLEN JACKSON 04/11/2020 @ 2249 A.HUGHES Performed at [facility], [place] [address] [place], [place] [address]    Culture STAPHYLOCOCCUS HOMINIS (A)  Final   Report Status 04/13/2020 FINAL  Final   Organism ID, Bacteria STAPHYLOCOCCUS HOMINIS  Final      Susceptibility   Staphylococcus hominis - MIC*    CIPROFLOXACIN <=0.5 SENSITIVE Sensitive     ERYTHROMYCIN <=0.25 SENSITIVE Sensitive     GENTAMICIN <=0.5 SENSITIVE Sensitive     OXACILLIN <=0.25 SENSITIVE Sensitive     TETRACYCLINE <=1 SENSITIVE Sensitive     VANCOMYCIN <=0.5 SENSITIVE Sensitive     TRIMETH/SULFA <=10 SENSITIVE Sensitive     CLINDAMYCIN <=0.25 SENSITIVE Sensitive     RIFAMPIN <=0.5 SENSITIVE Sensitive     Inducible Clindamycin NEGATIVE Sensitive     * STAPHYLOCOCCUS HOMINIS  Blood Culture ID Panel (Reflexed)     Status: Abnormal   Collection Time: 04/10/20  8:53 PM  Result Value Ref Range Status   Enterococcus faecalis NOT DETECTED NOT DETECTED Final   Enterococcus Faecium NOT DETECTED NOT DETECTED Final   Listeria monocytogenes NOT DETECTED NOT DETECTED Final   Staphylococcus species DETECTED (A) NOT DETECTED Final    Comment: CRITICAL RESULT CALLED TO, READ BACK BY AND VERIFIED WITH: PHARMD ELLEN JACKSON 04/11/2020 @ 2249 A.HUGHES    Staphylococcus aureus (BCID) NOT DETECTED NOT DETECTED Final   Staphylococcus epidermidis NOT DETECTED NOT DETECTED Final   Staphylococcus lugdunensis NOT DETECTED NOT DETECTED Final   Streptococcus species NOT DETECTED NOT DETECTED Final   Streptococcus agalactiae NOT DETECTED NOT DETECTED Final   Streptococcus pneumoniae NOT DETECTED NOT DETECTED Final   Streptococcus pyogenes NOT DETECTED NOT DETECTED Final    A.calcoaceticus-baumannii NOT DETECTED NOT DETECTED Final   Bacteroides fragilis NOT DETECTED NOT DETECTED Final   Enterobacterales NOT DETECTED NOT DETECTED Final   Enterobacter cloacae complex NOT DETECTED NOT DETECTED Final   Escherichia coli NOT DETECTED NOT DETECTED Final   Klebsiella aerogenes NOT DETECTED NOT DETECTED Final   Klebsiella oxytoca NOT DETECTED NOT DETECTED Final   Klebsiella pneumoniae NOT DETECTED NOT DETECTED Final   Proteus species NOT DETECTED NOT DETECTED Final   Salmonella species NOT DETECTED NOT DETECTED Final   Serratia marcescens NOT DETECTED NOT DETECTED Final   Haemophilus influenzae NOT DETECTED NOT DETECTED Final   Neisseria meningitidis NOT DETECTED NOT DETECTED Final   Pseudomonas aeruginosa NOT DETECTED NOT DETECTED Final   Stenotrophomonas maltophilia NOT DETECTED NOT DETECTED Final   Candida albicans NOT DETECTED NOT DETECTED Final   Candida auris NOT DETECTED NOT DETECTED Final   Candida glabrata NOT DETECTED NOT DETECTED Final   Candida krusei NOT DETECTED NOT DETECTED Final   Candida parapsilosis NOT DETECTED NOT DETECTED Final   Candida tropicalis NOT DETECTED NOT DETECTED Final   Cryptococcus neoformans/gattii NOT DETECTED NOT DETECTED Final    Comment: Performed at [facility], [address], [place],  [address]  Blood Culture (routine x 2)     Status:  Abnormal   Collection Time: 04/10/20  8:55 PM   Specimen: BLOOD  Result Value Ref Range Status   Specimen Description   Final    BLOOD RIGHT ANTECUBITAL Performed at [facility] [address] [place], [place] [address]    Special Requests   Final    BOTTLES DRAWN AEROBIC AND ANAEROBIC Blood Culture adequate volume Performed at [facility] [address] [place], [place] [address]    Culture  Setup Time   Final    GRAM POSITIVE COCCI AEROBIC BOTTLE ONLY CRITICAL RESULT CALLED TO, READ BACK BY AND VERIFIED WITH: PHARMD ELLEN  JACKSON 04/11/2020 @2249  A.HUGHES Performed at [facility], [place] [address], [place], [place] [address]    Culture STAPHYLOCOCCUS HOMINIS (A)  Final   Report Status 04/15/2020 FINAL  Final   Organism ID, Bacteria STAPHYLOCOCCUS HOMINIS  Final      Susceptibility   Staphylococcus hominis - MIC*    CIPROFLOXACIN <=0.5 SENSITIVE Sensitive     ERYTHROMYCIN <=0.25 SENSITIVE Sensitive     GENTAMICIN <=0.5 SENSITIVE Sensitive     OXACILLIN <=0.25 SENSITIVE Sensitive     TETRACYCLINE 2 SENSITIVE Sensitive     VANCOMYCIN 1 SENSITIVE Sensitive     TRIMETH/SULFA <=10 SENSITIVE Sensitive     CLINDAMYCIN <=0.25 SENSITIVE Sensitive     RIFAMPIN <=0.5 SENSITIVE Sensitive     Inducible Clindamycin NEGATIVE Sensitive     * STAPHYLOCOCCUS HOMINIS  Blood Culture (routine x 2)     Status: None   Collection Time: 04/10/20  9:53 PM   Specimen: BLOOD  Result Value Ref Range Status   Specimen Description   Final    BLOOD BLOOD RIGHT HAND Performed at [facility] [address], [place], [place] [address]    Special Requests   Final    BOTTLES DRAWN AEROBIC ONLY Blood Culture adequate volume Performed at [facility] [address], [place], [place] [address]    Culture   Final    NO GROWTH 5 DAYS Performed at [facility], [place] [address], [place], [place] [address]    Report Status 04/16/2020 FINAL  Final  Culture, blood (Routine X 2) w Reflex to ID Panel     Status: None   Collection Time: 04/13/20  6:35 AM   Specimen: BLOOD  Result Value Ref Range Status   Specimen Description   Final    BLOOD RIGHT ANTECUBITAL Performed at [facility] [address], [place], [place] [address]    Special Requests   Final    BOTTLES DRAWN AEROBIC AND ANAEROBIC Blood Culture adequate volume Performed at [facility] [place] [address], [place], [place] [address]    Culture   Final    NO GROWTH 5 DAYS Performed at  [facility], [place] [address], [place], [place] [address]    Report Status 04/18/2020 FINAL  Final  Culture, blood (Routine X 2) w Reflex to ID Panel     Status: None   Collection Time: 04/13/20  6:35 AM   Specimen: BLOOD RIGHT HAND  Result Value Ref Range Status   Specimen Description   Final    BLOOD RIGHT HAND Performed at [facility] [place] [address], [place],  [address]    Special Requests   Final    BOTTLES DRAWN AEROBIC ONLY Blood Culture adequate volume Performed at [place] [address] [place],  [place] [address]    Culture   Final    NO GROWTH 5 DAYS Performed at [facility], [place] [address], [place], [place] [address]    Report Status 04/18/2020 FINAL  Final  Culture, blood (routine x 2)     Status: None   Collection Time: 04/24/20  9:47 AM   Specimen: BLOOD  Result Value Ref Range Status   Specimen Description   Final    BLOOD LEFT ANTECUBITAL Performed at [facility] [address], [place], [place] [address]    Special Requests   Final    BOTTLES DRAWN AEROBIC AND ANAEROBIC Blood Culture adequate volume Performed at [facility] [address] [place], [place] [address]    Culture   Final    NO GROWTH 5 DAYS Performed at [facility], [place] [address], [place], [place] [address]    Report Status 04/29/2020 FINAL  Final  Culture, blood (routine x 2)     Status: Abnormal   Collection Time: 04/24/20  9:47 AM   Specimen: BLOOD LEFT HAND  Result Value Ref Range Status   Specimen Description   Final    BLOOD LEFT HAND Performed at [facility] [address], [place], [place] [address]    Special Requests   Final    BOTTLES DRAWN AEROBIC AND ANAEROBIC Blood Culture adequate volume Performed at [place] [address], [place], [place] [address]    Culture  Setup Time   Final    GRAM POSITIVE COCCI ANAEROBIC  BOTTLE ONLY CRITICAL VALUE NOTED.  VALUE IS CONSISTENT WITH PREVIOUSLY REPORTED AND CALLED VALUE.    Culture (A)  Final    STREPTOCOCCUS MITIS/ORALIS THE SIGNIFICANCE OF ISOLATING THIS ORGANISM FROM A SINGLE SET OF BLOOD CULTURES WHEN MULTIPLE SETS ARE DRAWN IS UNCERTAIN. PLEASE NOTIFY THE MICROBIOLOGY DEPARTMENT WITHIN ONE WEEK IF SPECIATION AND SENSITIVITIES ARE REQUIRED. Performed at [facility], [place] [address], [place], [place] [address]    Report Status 04/27/2020 FINAL  Final  MRSA PCR Screening     Status: None   Collection Time: 04/29/20  5:41 AM   Specimen: Nasal Mucosa; Nasopharyngeal  Result Value Ref Range Status   MRSA by PCR NEGATIVE NEGATIVE Final    Comment:        The GeneXpert MRSA Assay (FDA approved for NASAL specimens only), is one component of a comprehensive MRSA colonization surveillance program. It is not intended to diagnose MRSA infection nor to guide or monitor treatment for MRSA infections. Performed at [facility], [place] [address] [place], [place] [address]   Culture, blood (x 2)     Status: None (Preliminary result)   Collection Time: 05/01/20 12:38 PM   Specimen: BLOOD  Result Value Ref Range Status   Specimen Description   Final    BLOOD RIGHT ANTECUBITAL Performed at [facility] [place] [address] [place], [place] [address]    Special Requests   Final    BOTTLES DRAWN AEROBIC ONLY Blood Culture adequate volume Performed at [facility] [address], [place], [place] [address]    Culture   Final    NO GROWTH 2 DAYS Performed at [facility] [address], [name],  [address]    Report Status PENDING  Incomplete  Culture, blood (x 2)     Status: None (Preliminary result)   Collection Time: 05/01/20  3:21 PM  Specimen: BLOOD LEFT HAND  Result Value Ref Range Status   Specimen Description   Final    BLOOD LEFT HAND Performed at [facility], [place] [address] [place], [place] [address]    Special Requests   Final    BOTTLES DRAWN AEROBIC AND ANAEROBIC Blood Culture results may not be optimal due to an excessive volume of blood received in culture bottles Performed at [facility] [address], [place], [place] [address]    Culture   Final    NO GROWTH 2 DAYS Performed at [facility] [address] [place], [place] [address]    Report Status PENDING  Incomplete    Studies/Results: DG CHEST PORT 1 VIEW  Result Date: 05/01/2020 CLINICAL DATA:  Worsening shortness of breath and cough. EXAM: PORTABLE CHEST 1 VIEW COMPARISON:  CT scan April 30, 2020.  Chest x-ray April 30, 2020. FINDINGS: The right upper lobe infiltrate with associated cavitation is stable. The spiculated nodule in the left upper lobe is unchanged. Emphysematous changes are noted. Mild bibasilar opacities are stable. Scarring in the left perihilar region is stable. No other interval changes. IMPRESSION: 1. Persistent, stable cavitary infiltrate in the right upper lobe. 2. The spiculated nodule in the left upper lobe is seen on the CT scan from April 30, 2020, concerning for primary malignancy. 3. No other interval changes. Electronically Signed   By: Dorise Bullion III M.D   On: 05/01/2020 12:28   ECHOCARDIOGRAM LIMITED  Result Date: 05/02/2020    ECHOCARDIOGRAM LIMITED REPORT   Patient Name:   Joan Avetisyan. Date of Exam: 05/02/2020 Medical Rec #:  [id number]            Height:       69.0 in Accession #:    [id number]           Weight:       152.8 lb Date of Birth:  Oct 30, 1955             BSA:          1.843 m Patient Age:    65 years             BP:           112/64 mmHg Patient Gender: M                    HR:           92 bpm. Exam Location:  Inpatient Procedure: Limited Echo and Limited Color Doppler Indications:    Bacteremia R78.81  History:        Patient has prior history of Echocardiogram examinations, most                 recent 04/14/2020.  Risk Factors:Current Smoker and Hypertension.                 COVID-19. Cancer. Alcohol abuse.  Sonographer:    Tiffany Dance Referring Phys: [id number] [place]  1. Left ventricular ejection fraction, by estimation, is 65 to 70%. The left ventricle has normal function. The left ventricle has no regional wall motion abnormalities.  2. Right ventricular systolic function is normal. The right ventricular size is normal.  3. There is a rounded density off the anterior mitral valve leaflet that likely is calcification, but in the setting of bacteremia, cannot rule out vegetation. The lack of any significant MR makes endocarditis less likely. No  evidence of mitral valve regurgitation. No evidence of mitral stenosis.  4. The aortic valve is normal in structure. Aortic valve regurgitation is not visualized. No aortic stenosis is present.  5. There is mild dilatation of the aortic root, measuring 40 mm.  6. The inferior vena cava is normal in size with greater than 50% respiratory variability, suggesting right atrial pressure of 3 mmHg. FINDINGS  Left Ventricle: Left ventricular ejection fraction, by estimation, is 65 to 70%. The left ventricle has normal function. The left ventricle has no regional wall motion abnormalities. The left ventricular internal cavity size was normal in size. There is  no left ventricular hypertrophy. Right Ventricle: The right ventricular size is normal. No increase in right ventricular wall thickness. Right ventricular systolic function is normal. Left Atrium: Left atrial size was normal in size. Right Atrium: Right atrial size was normal in size. Pericardium: There is no evidence of pericardial effusion. Mitral Valve: There is a rounded density off the anterior mitral valve leaflet that likely is calcification, but in the setting of bacteremia, cannot rule out vegetation. The mitral valve is normal in structure. No evidence of mitral valve stenosis. Tricuspid Valve: The tricuspid  valve is normal in structure. Tricuspid valve regurgitation is not demonstrated. No evidence of tricuspid stenosis. Aortic Valve: The aortic valve is normal in structure. Aortic valve regurgitation is not visualized. No aortic stenosis is present. Pulmonic Valve: The pulmonic valve was normal in structure. Pulmonic valve regurgitation is trivial. No evidence of pulmonic stenosis. Aorta: The aortic root is normal in size and structure. There is mild dilatation of the aortic root, measuring 40 mm. Venous: The inferior vena cava is normal in size with greater than 50% respiratory variability, suggesting right atrial pressure of 3 mmHg. IAS/Shunts: The interatrial septum appears to be lipomatous. No atrial level shunt detected by color flow Doppler. LEFT VENTRICLE PLAX 2D LVIDd:         4.30 cm LVIDs:         3.60 cm LV PW:         1.20 cm LV IVS:        1.10 cm LVOT diam:     2.20 cm LVOT Area:     3.80 cm  IVC IVC diam: 1.20 cm LEFT ATRIUM         Index LA diam:    2.80 cm 1.52 cm/m   AORTA Ao Root diam: 4.00 cm  SHUNTS Systemic Diam: 2.20 cm Fransico Him MD Electronically signed by Fransico Him MD Signature Date/Time: 05/02/2020/1:27:17 PM    Final       Assessment/Plan:  INTERVAL HISTORY: Had worsening hypoxia and was transferred the ICU where he had BiPAP and now is on nasal cannula.   Principal Problem:   Acute respiratory failure with hypoxia ([name]) Active Problems:   COPD with acute exacerbation ([place])   COVID-19 virus infection   Primary cancer of left upper lobe of lung ([name])   Cavitary pneumonia   SIADH (syndrome of inappropriate ADH production) ([place])   Essential hypertension   Mixed hyperlipidemia   Sepsis ([place])   Bullous emphysema ([name])    Astin Sayre. is a 65 y.o. male with complicated past medical history including COPD with bullous emphysema, left upper lobe lung cancer status post radiation therapy in 2020, history of pneumothorax, COVID-19 infection, transient coag  negative staphylococcal bacteremia earlier this month who has now developed necrotizing infection and upper lobe.  He has been narrowed to Augmentin.  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

## 2020-05-04 DIAGNOSIS — T17908A Unspecified foreign body in respiratory tract, part unspecified causing other injury, initial encounter: Secondary | ICD-10-CM | POA: Diagnosis not present

## 2020-05-04 DIAGNOSIS — R109 Unspecified abdominal pain: Secondary | ICD-10-CM

## 2020-05-04 DIAGNOSIS — R1033 Periumbilical pain: Secondary | ICD-10-CM

## 2020-05-04 MED ORDER — PREDNISONE 20 MG PO TABS
40.0000 mg | ORAL_TABLET | Freq: Every day | ORAL | Status: AC
  Administered 2020-05-04 – 2020-05-05 (×2): 40 mg via ORAL
  Filled 2020-05-04 (×2): qty 2

## 2020-05-04 MED ORDER — PREDNISONE 5 MG PO TABS
10.0000 mg | ORAL_TABLET | Freq: Every day | ORAL | Status: DC
  Administered 2020-05-10: 10 mg via ORAL
  Filled 2020-05-04 (×2): qty 2

## 2020-05-04 MED ORDER — PREDNISONE 5 MG PO TABS
30.0000 mg | ORAL_TABLET | Freq: Every day | ORAL | Status: AC
  Administered 2020-05-06 – 2020-05-07 (×2): 30 mg via ORAL
  Filled 2020-05-04 (×2): qty 1

## 2020-05-04 MED ORDER — PREDNISONE 20 MG PO TABS
20.0000 mg | ORAL_TABLET | Freq: Every day | ORAL | Status: AC
  Administered 2020-05-05 – 2020-05-08 (×2): 20 mg via ORAL
  Filled 2020-05-04: qty 1

## 2020-05-04 MED ORDER — IPRATROPIUM-ALBUTEROL 0.5-2.5 (3) MG/3ML IN SOLN
3.0000 mL | Freq: Three times a day (TID) | RESPIRATORY_TRACT | Status: DC
  Administered 2020-05-05 – 2020-05-10 (×17): 3 mL via RESPIRATORY_TRACT
  Filled 2020-05-04 (×18): qty 3

## 2020-05-04 NOTE — TOC Progression Note (Signed)
Transition of Care (TOC) - Progression Note    Patient Details  Name: Jurgen Groeneveld. MRN: [id number] Date of Birth: 12-28-1955  Transition of Care [facility]) CM/SW Contact  Leeroy Cha, RN Phone Number: 05/04/2020, 1:43 PM  Clinical Narrative:    Home o2 ordered through [employer]  [facility] services. At 1343.   Expected Discharge Plan: Home/Self Care Barriers to Discharge: Continued Medical Work up  Expected Discharge Plan and Services Expected Discharge Plan: Home/Self Care   Discharge Planning Services: CM Consult   Living arrangements for the past 2 months: Apartment                                       Social Determinants of Health (SDOH) Interventions    Readmission Risk Interventions Readmission Risk Prevention Plan 10/24/2017  Post Dischage Appt Complete  Medication Screening Complete  Transportation Screening Complete  PCP follow-up Complete  Some recent data might be hidden

## 2020-05-04 NOTE — Progress Notes (Signed)
  Speech Language Pathology Treatment:    Patient Details Name: Terry Page. MRN: [id number] DOB: 1955/08/17 Today's Date: 05/04/2020 Time: [id number] SLP Time Calculation (min) (ACUTE ONLY): 45 min  Assessment / Plan / Recommendation Clinical Impression  SLP determined to conduct 3 ounce [place] water screen to assess for aspiration risk - which pt easily passed.  Mild coughing noted approximately 15 minutes after water intake which was not productive.  Pt reports he has been coughing with eating - more so later in meal with food more than drink.  Admits this has been occuring since coming to ICU but denies refluxing.   RR remained stable at 18 to 21 during intake without changes in HR, etc.  Pt noted to clear his throat and demonstrate throat clearing and mildly wet cough with HOB lowered for repositioning - concerning for potential reflux.  Pt also reports he does not tolerate very cold items as it "hits his stomach".  Reviewed that Mylanta is ordered for him prn if he is symptomatic.  Suspect potential esophageal dysphagia - and compressive exacerbation due to distended abdomen.  Advised pt to eat small amounts  (save items that may remain at room temperature for later) to determine if can prevent his coughing episodes - to which pt stated, "When I eat, I eat everything". Encouraged pt to stay partially upright after meals for at least 30-45 minutes.   Pt's voice is subjectively mildly hoarse - and he reports this to be present just since admission - providing a number 5/10 *10 normal voice.  He describes voice as hoarse but denies decreased phonatory strength.  Earlier in admit, pt advised he has reflux but later denies this occuring.  Will follow up x1 more - hopefully with a meal in attempts to delineate dysphagia symptoms.   Pt agreeable to plan.   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 Course complicated by SBO, improved  with transient NG tube decompression.  Developed right upper lobe air-fluid levels/necrotizing pneumonia.  Transferred to ICU 4/30 for respiratory distress requiring BiPAP. Received consult given concern for potential aspiration event.  Pt seen for follow up regarding aspiration risk. Today he reports he is coughing with solids more than liquids toward end of meal.  He denies refluxing and states he eats all of his meal.      SLP Plan  Continue with current plan of care       Recommendations  Diet recommendations: Dysphagia 3 (mechanical soft);Thin liquid Liquids provided via: Cup;Straw Medication Administration: Whole meds with liquid Supervision: Patient able to self feed;Intermittent supervision to cue for compensatory strategies Compensations: Slow rate;Small sips/bites Postural Changes and/or Swallow Maneuvers: Seated upright 90 degrees;Upright 30-60 min after meal                Oral Care Recommendations: Oral care BID Follow up Recommendations: Other (comment) (TBD) SLP Visit Diagnosis: Dysphagia, unspecified (R13.10) Plan: Continue with current plan of care       GO                Terry Page 05/04/2020, 12:53 PM  Kathleen Lime, MS [facility] SLP Acute Rehab Services Office [phone] Pager [phone]

## 2020-05-04 NOTE — Progress Notes (Addendum)
PROGRESS NOTE  [name]. 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  Admitted for acute hypoxic respiratory failure secondary to right upper lobe bacterial pneumonia and concurrent COVID-19 infection.  Subsequently found to have staph hominis bacteremia.  Successfully treated and recovered, isolation removed.  Then developed small bowel obstruction which resolved spontaneously, followed by general surgery.  Subsequently developed COPD exacerbation and healthcare associated pneumonia.  Failed to improve, CT chest showed infected bulla, multilobar pneumonia.  Condition worsened, required BiPAP, transferred to stepdown.  Seen by infectious disease and pulmonology.  Transitioned to oral antibiotics.  Subsequently noted to have choking episodes with food.  Seen by speech therapy and diet advanced.  Now improving, transferred to progressive unit.  If continues to improve and oxygen is stable, can likely discharge in 48 hours to SNF.  A & P  Acute hypoxic respiratory failure secondary to multilobar pneumonia, infected bulla, necrotizing pneumonia. -- Clinically much better.  Down to 2-3 L.  Will require oxygen on discharge based on ambulatory monitoring. --Afebrile.  Continue Augmentin per infectious disease. --Follow-up with pulmonology  Wednesday 5/18 at 10:30AM with Rexene Edison, NP.  Dysphagia.   -- Continue diet as per speech therapy  Acute COPD exacerbation, chronic bullous emphysema, PMH 2 spontaneous pneumothoraces -- Exacerbation resolved.  Steroid taper as per pulmonology.  Primary cancer left upper lobe lung treated with XRT.  CT abdomen pelvis 4/15 raise concern new cavitating nodule left lower lobe as well as subpleural nodule left lower lobe.  Possible additional nodules right lung. --Close outpatient follow-up with  radiation oncology after discharge  Chronic leukocytosis --Etiology unclear, seen June and May 2021 as well as during this hospitalization.  Follow-up as an outpatient.  SBO, resolved --Seen 4/15 and CT abdomen pelvis, followed by general surgery, resolved spontaneously  COVID-pneumonia treated with remdesivir and steroids, completed isolation 4/19   Sepsis present on admission, bacterial pneumonia, treated with azithromycin and ceftriaxone  Chronic SIADH -- Mild.  Stable  Anemia of chronic disease, stable.  Prostate cancer status post XRT Aortic atherosclerosis --no tx indicated  Staph hominis bacteremia 4/9, unclear source, lacks indwelling hardware, lines, cardiac devices. TTE no evidence of valvular vegetation.  Seen by ID, completed cefazolin total 7 days from complicated coag negative staph bacteremia.  1/2 staph mitis contaminant --No further evaluation suggested    Disposition Plan:  Discussion: Continues to improve.  Continue antibiotics, monitor diet.  Wean oxygen.  Hopefully can transfer to SNF in 48 hours.  Status is: Inpatient  Remains inpatient appropriate because:IV treatments appropriate due to intensity of illness or inability to take PO and Inpatient level of care appropriate due to severity of illness   Dispo:  Patient From: Home  Planned Disposition: [place]  Medically stable for discharge: No     DVT prophylaxis: enoxaparin (LOVENOX) injection 40 mg Start: 04/10/20 2200   Code Status: Full Code Level of care: Progressive Family Communication: none  Murray Hodgkins, MD  [facility] Direct contact: see [email or web address] (further directions at bottom of note if needed) 7PM-7AM contact night coverage as at bottom of note 05/04/2020, 2:22 PM  LOS: 22 days   Significant Hospital Events   . 4/9 admit for pneumonia . 4/16 SBO . 4/29 CT chest: severe RUL disease w/ multiple fluid levels, consider necrotizing pneumonia; multilobar  pneumonia   Consults:  .  ID . General surgery . Pulmonology    Antimicrobials:  Ceftriaxone 4/9 > 4/13 Azithromycin 4/9 > 4/13 Cefazolin 4/14 > 4/15 Vancomycin 4/11 > 4/13 + 4/25 > 4/29, 4/30   Cefepime 4/25 > 4/30 Augmentin 5/1 >  Interval History/Subjective  CC: f/u SOB  Overall better, breathing better.  Bowels moving without pain.  Has some mild abdominal pain today.  Objective   Vitals:  Vitals:   05/04/20 1100 05/04/20 1200  BP: 128/80 124/79  Pulse: 88 95  Resp: 19 (!) 24  Temp:  97.6 F (36.4 C)  SpO2: 96% 93%    Exam:  Constitutional:   . Appears calm and comfortable, appears better today ENMT:  . grossly normal hearing  Respiratory:  . CTA bilaterally, no w/r/r.  . Respiratory effort normal.  Cardiovascular:  . RRR, no m/r/g . No LE extremity edema   Abdomen:  . Soft distended nt Psychiatric:  . Mental status o Mood, affect appropriate  I have personally reviewed the following:   Today's Data  . None today  Scheduled Meds: . amLODipine  10 mg Oral Daily  . amoxicillin-clavulanate  1 tablet Oral Q12H  . aspirin EC  81 mg Oral Daily  . chlorhexidine  15 mL Mouth Rinse BID  . Chlorhexidine Gluconate Cloth  6 each Topical Daily  . enoxaparin (LOVENOX) injection  40 mg Subcutaneous Q24H  . guaiFENesin  600 mg Oral BID  . ipratropium-albuterol  3 mL Nebulization QID  . irbesartan  300 mg Oral Daily  . mouth rinse  15 mL Mouth Rinse q12n4p  . polyethylene glycol  17 g Oral BID  . predniSONE  40 mg Oral Q breakfast   Followed by  . [START ON 05/06/2020] predniSONE  30 mg Oral Q breakfast   Followed by  . [START ON 05/08/2020] predniSONE  20 mg Oral Q breakfast   Followed by  . [START ON 05/10/2020] predniSONE  10 mg Oral Q breakfast  . senna  1 tablet Oral QHS   Continuous Infusions:   Principal Problem:   Acute respiratory failure with hypoxia ([name]) Active Problems:   COPD with acute exacerbation ([place])   COVID-19 virus infection    Primary cancer of left upper lobe of lung ([name])   Cavitary pneumonia   SIADH (syndrome of inappropriate ADH production) ([place])   Essential hypertension   Mixed hyperlipidemia   Sepsis ([place])   Bullous emphysema ([name])   Necrotizing pneumonia ([place])   LOS: 22 days   How to contact the [facility] Attending or Consulting provider 7A - 7P or covering provider during after hours [place], for this patient?  1. Check the care team in [facility] and look for a) attending/consulting TRH provider listed and b) the [facility] team listed 2. Log into [email or web address] and use [place] universal password to access. If you do not have the password, please contact the hospital operator. 3. Locate the [facility] provider you are looking for under [facility] and page to a number that you can be directly reached. 4. If you still have difficulty reaching the provider, please page the [facility] (Director on Call) for the Hospitalists listed on amion for assistance.

## 2020-05-04 NOTE — Progress Notes (Signed)
Subjective: Still c/o bloating   Antibiotics:  Anti-infectives (From admission, onward)   Start     Dose/Rate Route Frequency Ordered Stop   05/01/20 2200  vancomycin (VANCOREADY) IVPB 1000 mg/200 mL  Status:  Discontinued        1,000 mg 200 mL/hr over 60 Minutes Intravenous Every 12 hours 05/01/20 0903 05/01/20 1607   05/01/20 2200  amoxicillin-clavulanate (AUGMENTIN) 875-125 MG per tablet 1 tablet        1 tablet Oral Every 12 hours 05/01/20 1701     05/01/20 1000  vancomycin (VANCOREADY) IVPB 1250 mg/250 mL        1,250 mg 166.7 mL/hr over 90 Minutes Intravenous  Once 05/01/20 0903 05/01/20 1143   04/30/20 1800  Ampicillin-Sulbactam (UNASYN) 3 g in sodium chloride 0.9 % 100 mL IVPB  Status:  Discontinued        3 g 200 mL/hr over 30 Minutes Intravenous Every 6 hours 04/30/20 1555 05/01/20 1701   04/27/20 0600  vancomycin (VANCOREADY) IVPB 1000 mg/200 mL  Status:  Discontinued        1,000 mg 200 mL/hr over 60 Minutes Intravenous Every 12 hours 04/26/20 1329 04/30/20 1037   04/26/20 2200  ceFEPIme (MAXIPIME) 2 g in sodium chloride 0.9 % 100 mL IVPB  Status:  Discontinued        2 g 200 mL/hr over 30 Minutes Intravenous Every 8 hours 04/26/20 1329 04/30/20 1545   04/26/20 1400  vancomycin (VANCOREADY) IVPB 1500 mg/300 mL        1,500 mg 150 mL/hr over 120 Minutes Intravenous  Once 04/26/20 1306 04/26/20 2008   04/26/20 1400  ceFEPIme (MAXIPIME) 2 g in sodium chloride 0.9 % 100 mL IVPB        2 g 200 mL/hr over 30 Minutes Intravenous  Once 04/26/20 1306 04/26/20 1536   04/15/20 1400  ceFAZolin (ANCEF) IVPB 2g/100 mL premix        2 g 200 mL/hr over 30 Minutes Intravenous Every 8 hours 04/15/20 0922 04/17/20 0000   04/13/20 0600  vancomycin (VANCOREADY) IVPB 1250 mg/250 mL  Status:  Discontinued        1,250 mg 166.7 mL/hr over 90 Minutes Intravenous Every 12 hours 04/12/20 1347 04/15/20 0922   04/12/20 1430  vancomycin (VANCOREADY) IVPB 1500 mg/300 mL        1,500  mg 150 mL/hr over 120 Minutes Intravenous  Once 04/12/20 1342 04/12/20 1702   04/11/20 2000  cefTRIAXone (ROCEPHIN) 2 g in sodium chloride 0.9 % 100 mL IVPB  Status:  Discontinued        2 g 200 mL/hr over 30 Minutes Intravenous Every 24 hours 04/10/20 2119 04/15/20 0922   04/11/20 1000  remdesivir 100 mg in sodium chloride 0.9 % 100 mL IVPB       "Followed by" Linked Group Details   100 mg 200 mL/hr over 30 Minutes Intravenous Daily 04/10/20 2011 04/14/20 1100   04/10/20 2200  remdesivir 200 mg in sodium chloride 0.9% 250 mL IVPB       "Followed by" Linked Group Details   200 mg 580 mL/hr over 30 Minutes Intravenous Once 04/10/20 2011 04/11/20 0002   04/10/20 1915  cefTRIAXone (ROCEPHIN) 2 g in sodium chloride 0.9 % 100 mL IVPB  Status:  Discontinued        2 g 200 mL/hr over 30 Minutes Intravenous Every 24 hours 04/10/20 1909 04/10/20 2119   04/10/20 1915  azithromycin (  ZITHROMAX) 500 mg in sodium chloride 0.9 % 250 mL IVPB  Status:  Discontinued        500 mg 250 mL/hr over 60 Minutes Intravenous Every 24 hours 04/10/20 1909 04/15/20 4268      Medications: Scheduled Meds: . amLODipine  10 mg Oral Daily  . amoxicillin-clavulanate  1 tablet Oral Q12H  . aspirin EC  81 mg Oral Daily  . chlorhexidine  15 mL Mouth Rinse BID  . Chlorhexidine Gluconate Cloth  6 each Topical Daily  . enoxaparin (LOVENOX) injection  40 mg Subcutaneous Q24H  . guaiFENesin  600 mg Oral BID  . ipratropium-albuterol  3 mL Nebulization QID  . irbesartan  300 mg Oral Daily  . mouth rinse  15 mL Mouth Rinse q12n4p  . polyethylene glycol  17 g Oral BID  . predniSONE  40 mg Oral Q breakfast   Followed by  . [START ON 05/06/2020] predniSONE  30 mg Oral Q breakfast   Followed by  . [START ON 05/08/2020] predniSONE  20 mg Oral Q breakfast   Followed by  . [START ON 05/10/2020] predniSONE  10 mg Oral Q breakfast  . senna  1 tablet Oral QHS   Continuous Infusions: PRN Meds:.acetaminophen, albuterol, alum & mag  hydroxide-simeth, guaiFENesin-dextromethorphan, melatonin, morphine injection, ondansetron **OR** ondansetron (ZOFRAN) IV, phenol, sodium chloride    Objective: Weight change:   Intake/Output Summary (Last 24 hours) at 05/04/2020 1510 Last data filed at 05/04/2020 1347 Gross per 24 hour  Intake 720 ml  Output 1025 ml  Net -305 ml   Blood pressure 124/79, pulse 95, temperature 97.6 F (36.4 C), temperature source Oral, resp. rate (!) 24, height 5\' 9"  (1.753 m), weight 69.3 kg, SpO2 93 %. Temp:  [97.4 F (36.3 C)-98.2 F (36.8 C)] 97.6 F (36.4 C) (05/03 1200) Pulse Rate:  [48-113] 95 (05/03 1200) Resp:  [15-25] 24 (05/03 1200) BP: (96-160)/(60-96) 124/79 (05/03 1200) SpO2:  [93 %-100 %] 93 % (05/03 1200)  Physical Exam: Physical Exam Constitutional:      Appearance: He is ill-appearing.  HENT:     Head: Normocephalic.  Eyes:     Extraocular Movements: Extraocular movements intact.  Cardiovascular:     Rate and Rhythm: Tachycardia present.     Heart sounds: No murmur heard. No friction rub. No gallop.   Pulmonary:     Effort: No respiratory distress.     Breath sounds: No stridor. Examination of the right-upper field reveals decreased breath sounds. Decreased breath sounds present.  Abdominal:     General: There is distension.     Palpations: There is no mass.     Tenderness: There is no abdominal tenderness. There is no rebound.  Musculoskeletal:     Cervical back: Normal range of motion.  Skin:    General: Skin is warm.  Neurological:     General: No focal deficit present.     Mental Status: He is alert.  Psychiatric:        Attention and Perception: Attention normal.        Mood and Affect: Mood is depressed.        Behavior: Behavior normal.        Thought Content: Thought content normal.        Judgment: Judgment normal.      CBC:    BMET Recent Labs    05/02/20 0221 05/03/20 0240  NA 137 132*  K 4.5 3.4*  CL 103 100  CO2 25 25  GLUCOSE 151*  112*  BUN 16 19  CREATININE 0.53* 0.45*  CALCIUM 9.0 8.8*     Liver Panel  No results for input(s): PROT, ALBUMIN, AST, ALT, ALKPHOS, BILITOT, BILIDIR, IBILI in the last 72 hours.     Sedimentation Rate No results for input(s): ESRSEDRATE in the last 72 hours. C-Reactive Protein No results for input(s): CRP in the last 72 hours.  Micro Results: Recent Results (from the past 720 hour(s))  Resp Panel by RT-PCR (Flu A&B, Covid) Nasopharyngeal Swab     Status: Abnormal   Collection Time: 04/10/20  5:35 PM   Specimen: Nasopharyngeal Swab; Nasopharyngeal(NP) swabs in vial transport medium  Result Value Ref Range Status   SARS Coronavirus 2 by RT PCR POSITIVE (A) NEGATIVE Final    Comment: RESULT CALLED TO, READ BACK BY AND VERIFIED WITH: JOHN FRICKUY RN04/09/22 @1916  BY P.HENDERSON (NOTE) SARS-CoV-2 target nucleic acids are DETECTED.  The SARS-CoV-2 RNA is generally detectable in upper respiratory specimens during the acute phase of infection. Positive results are indicative of the presence of the identified virus, but do not rule out bacterial infection or co-infection with other pathogens not detected by the test. Clinical correlation with patient history and other diagnostic information is necessary to determine patient infection status. The expected result is Negative.  Fact Sheet for Patients: EntrepreneurPulse.com.au  Fact Sheet for Healthcare Providers: IncredibleEmployment.be  This test is not yet approved or cleared by the [place] FDA and  has been authorized for detection and/or diagnosis of SARS-CoV-2 by FDA under an Emergency Use Authorization (EUA).  This EUA will remain in effect (meaning this  test can be used) for the duration of  the COVID-19 declaration under Section 564(b)(1) of the Act, 21 U.S.C. section 360bbb-3(b)(1), unless the authorization is terminated or revoked sooner.     Influenza A by PCR NEGATIVE  NEGATIVE Final   Influenza B by PCR NEGATIVE NEGATIVE Final    Comment: (NOTE) The Xpert Xpress SARS-CoV-2/FLU/RSV plus assay is intended as an aid in the diagnosis of influenza from Nasopharyngeal swab specimens and should not be used as a sole basis for treatment. Nasal washings and aspirates are unacceptable for Xpert Xpress SARS-CoV-2/FLU/RSV testing.  Fact Sheet for Patients: EntrepreneurPulse.com.au  Fact Sheet for Healthcare Providers: IncredibleEmployment.be  This test is not yet approved or cleared by the [place] FDA and has been authorized for detection and/or diagnosis of SARS-CoV-2 by FDA under an Emergency Use Authorization (EUA). This EUA will remain in effect (meaning this test can be used) for the duration of the COVID-19 declaration under Section 564(b)(1) of the Act, 21 U.S.C. section 360bbb-3(b)(1), unless the authorization is terminated or revoked.  Performed at [facility], [place] [address] [place], [place] [address]   Urine culture     Status: Abnormal   Collection Time: 04/10/20  8:51 PM   Specimen: In/Out Cath Urine  Result Value Ref Range Status   Specimen Description   Final    IN/OUT CATH URINE Performed at [facility] [address] [place], [place] [address]    Special Requests   Final    NONE Performed at [facility], [place] [address] [place], [place] [address]    Culture (A)  Final    <10,000 COLONIES/mL INSIGNIFICANT GROWTH Performed at [facility] [address] [place], [place] [address]    Report Status 04/12/2020 FINAL  Final  Culture, blood (single)     Status: Abnormal  Collection Time: 04/10/20  8:53 PM   Specimen: BLOOD  Result Value Ref Range Status   Specimen Description   Final    BLOOD LEFT ANTECUBITAL Performed at [facility] [address] [place], [place] [address]    Special Requests    Final    BOTTLES DRAWN AEROBIC AND ANAEROBIC Blood Culture adequate volume Performed at [facility] [address], [place], [place] [address]    Culture  Setup Time   Final    GRAM POSITIVE COCCI IN BOTH AEROBIC AND ANAEROBIC BOTTLES CRITICAL RESULT CALLED TO, READ BACK BY AND VERIFIED WITH: PHARMD ELLEN JACKSON 04/11/2020 @ 2249 A.HUGHES Performed at [facility], [place] [address] [place], [place] [address]    Culture STAPHYLOCOCCUS HOMINIS (A)  Final   Report Status 04/13/2020 FINAL  Final   Organism ID, Bacteria STAPHYLOCOCCUS HOMINIS  Final      Susceptibility   Staphylococcus hominis - MIC*    CIPROFLOXACIN <=0.5 SENSITIVE Sensitive     ERYTHROMYCIN <=0.25 SENSITIVE Sensitive     GENTAMICIN <=0.5 SENSITIVE Sensitive     OXACILLIN <=0.25 SENSITIVE Sensitive     TETRACYCLINE <=1 SENSITIVE Sensitive     VANCOMYCIN <=0.5 SENSITIVE Sensitive     TRIMETH/SULFA <=10 SENSITIVE Sensitive     CLINDAMYCIN <=0.25 SENSITIVE Sensitive     RIFAMPIN <=0.5 SENSITIVE Sensitive     Inducible Clindamycin NEGATIVE Sensitive     * STAPHYLOCOCCUS HOMINIS  Blood Culture ID Panel (Reflexed)     Status: Abnormal   Collection Time: 04/10/20  8:53 PM  Result Value Ref Range Status   Enterococcus faecalis NOT DETECTED NOT DETECTED Final   Enterococcus Faecium NOT DETECTED NOT DETECTED Final   Listeria monocytogenes NOT DETECTED NOT DETECTED Final   Staphylococcus species DETECTED (A) NOT DETECTED Final    Comment: CRITICAL RESULT CALLED TO, READ BACK BY AND VERIFIED WITH: PHARMD ELLEN JACKSON 04/11/2020 @ 2249 A.HUGHES    Staphylococcus aureus (BCID) NOT DETECTED NOT DETECTED Final   Staphylococcus epidermidis NOT DETECTED NOT DETECTED Final   Staphylococcus lugdunensis NOT DETECTED NOT DETECTED Final   Streptococcus species NOT DETECTED NOT DETECTED Final   Streptococcus agalactiae NOT DETECTED NOT DETECTED Final   Streptococcus pneumoniae NOT DETECTED NOT DETECTED Final    Streptococcus pyogenes NOT DETECTED NOT DETECTED Final   A.calcoaceticus-baumannii NOT DETECTED NOT DETECTED Final   Bacteroides fragilis NOT DETECTED NOT DETECTED Final   Enterobacterales NOT DETECTED NOT DETECTED Final   Enterobacter cloacae complex NOT DETECTED NOT DETECTED Final   Escherichia coli NOT DETECTED NOT DETECTED Final   Klebsiella aerogenes NOT DETECTED NOT DETECTED Final   Klebsiella oxytoca NOT DETECTED NOT DETECTED Final   Klebsiella pneumoniae NOT DETECTED NOT DETECTED Final   Proteus species NOT DETECTED NOT DETECTED Final   Salmonella species NOT DETECTED NOT DETECTED Final   Serratia marcescens NOT DETECTED NOT DETECTED Final   Haemophilus influenzae NOT DETECTED NOT DETECTED Final   Neisseria meningitidis NOT DETECTED NOT DETECTED Final   Pseudomonas aeruginosa NOT DETECTED NOT DETECTED Final   Stenotrophomonas maltophilia NOT DETECTED NOT DETECTED Final   Candida albicans NOT DETECTED NOT DETECTED Final   Candida auris NOT DETECTED NOT DETECTED Final   Candida glabrata NOT DETECTED NOT DETECTED Final   Candida krusei NOT DETECTED NOT DETECTED Final   Candida parapsilosis NOT DETECTED NOT DETECTED Final   Candida tropicalis NOT DETECTED NOT DETECTED Final   Cryptococcus neoformans/gattii NOT DETECTED NOT DETECTED Final  Comment: Performed at [facility], [place] [address], [place], [place] [address]  Blood Culture (routine x 2)     Status: Abnormal   Collection Time: 04/10/20  8:55 PM   Specimen: BLOOD  Result Value Ref Range Status   Specimen Description   Final    BLOOD RIGHT ANTECUBITAL Performed at [facility] [address] [place], [place] [address]    Special Requests   Final    BOTTLES DRAWN AEROBIC AND ANAEROBIC Blood Culture adequate volume Performed at [facility] [address], [place], [place] [address]    Culture  Setup Time   Final    GRAM POSITIVE COCCI AEROBIC BOTTLE ONLY CRITICAL  RESULT CALLED TO, READ BACK BY AND VERIFIED WITH: PHARMD Dolly Rias 04/11/2020 @2249  A.HUGHES Performed at [facility], [place] [address] [place], [place] [address]    Culture STAPHYLOCOCCUS HOMINIS (A)  Final   Report Status 04/15/2020 FINAL  Final   Organism ID, Bacteria STAPHYLOCOCCUS HOMINIS  Final      Susceptibility   Staphylococcus hominis - MIC*    CIPROFLOXACIN <=0.5 SENSITIVE Sensitive     ERYTHROMYCIN <=0.25 SENSITIVE Sensitive     GENTAMICIN <=0.5 SENSITIVE Sensitive     OXACILLIN <=0.25 SENSITIVE Sensitive     TETRACYCLINE 2 SENSITIVE Sensitive     VANCOMYCIN 1 SENSITIVE Sensitive     TRIMETH/SULFA <=10 SENSITIVE Sensitive     CLINDAMYCIN <=0.25 SENSITIVE Sensitive     RIFAMPIN <=0.5 SENSITIVE Sensitive     Inducible Clindamycin NEGATIVE Sensitive     * STAPHYLOCOCCUS HOMINIS  Blood Culture (routine x 2)     Status: None   Collection Time: 04/10/20  9:53 PM   Specimen: BLOOD  Result Value Ref Range Status   Specimen Description   Final    BLOOD BLOOD RIGHT HAND Performed at [facility] [address] [place], [place] [address]    Special Requests   Final    BOTTLES DRAWN AEROBIC ONLY Blood Culture adequate volume Performed at [facility] [address], [place], [place] [address]    Culture   Final    NO GROWTH 5 DAYS Performed at [facility], [place] [address] [place], [place] [address]    Report Status 04/16/2020 FINAL  Final  Culture, blood (Routine X 2) w Reflex to ID Panel     Status: None   Collection Time: 04/13/20  6:35 AM   Specimen: BLOOD  Result Value Ref Range Status   Specimen Description   Final    BLOOD RIGHT ANTECUBITAL Performed at [facility] [address], [place], [place] [address]    Special Requests   Final    BOTTLES DRAWN AEROBIC AND ANAEROBIC Blood Culture adequate volume Performed at [facility] [address] [place], [place]  [address]    Culture   Final    NO GROWTH 5 DAYS Performed at [facility], [place] [address] [place], [place] [address]    Report Status 04/18/2020 FINAL  Final  Culture, blood (Routine X 2) w Reflex to ID Panel     Status: None   Collection Time: 04/13/20  6:35 AM   Specimen: BLOOD RIGHT HAND  Result Value Ref Range Status   Specimen Description   Final    BLOOD RIGHT HAND Performed at [facility] [address] [place], [place] [address]    Special Requests  Final    BOTTLES DRAWN AEROBIC ONLY Blood Culture adequate volume Performed at [facility] [address], [place], [place] [address]    Culture   Final    NO GROWTH 5 DAYS Performed at [facility], [place] [address], [place], [place] [address]    Report Status 04/18/2020 FINAL  Final  Culture, blood (routine x 2)     Status: None   Collection Time: 04/24/20  9:47 AM   Specimen: BLOOD  Result Value Ref Range Status   Specimen Description   Final    BLOOD LEFT ANTECUBITAL Performed at [facility] [address] [place], [place] [address]    Special Requests   Final    BOTTLES DRAWN AEROBIC AND ANAEROBIC Blood Culture adequate volume Performed at [facility] [address] [place], [place] [address]    Culture   Final    NO GROWTH 5 DAYS Performed at [facility], [place] [address], [place], [place] [address]    Report Status 04/29/2020 FINAL  Final  Culture, blood (routine x 2)     Status: Abnormal   Collection Time: 04/24/20  9:47 AM   Specimen: BLOOD LEFT HAND  Result Value Ref Range Status   Specimen Description   Final    BLOOD LEFT HAND Performed at [facility] [address], [place], [place] [address]    Special Requests   Final    BOTTLES DRAWN AEROBIC AND ANAEROBIC Blood Culture adequate volume Performed at [facility] [address] [place], [place] [address]     Culture  Setup Time   Final    GRAM POSITIVE COCCI ANAEROBIC BOTTLE ONLY CRITICAL VALUE NOTED.  VALUE IS CONSISTENT WITH PREVIOUSLY REPORTED AND CALLED VALUE.    Culture (A)  Final    STREPTOCOCCUS MITIS/ORALIS THE SIGNIFICANCE OF ISOLATING THIS ORGANISM FROM A SINGLE SET OF BLOOD CULTURES WHEN MULTIPLE SETS ARE DRAWN IS UNCERTAIN. PLEASE NOTIFY THE MICROBIOLOGY DEPARTMENT WITHIN ONE WEEK IF SPECIATION AND SENSITIVITIES ARE REQUIRED. Performed at [facility], [place] [address], [place], [place] [address]    Report Status 04/27/2020 FINAL  Final  MRSA PCR Screening     Status: None   Collection Time: 04/29/20  5:41 AM   Specimen: Nasal Mucosa; Nasopharyngeal  Result Value Ref Range Status   MRSA by PCR NEGATIVE NEGATIVE Final    Comment:        The GeneXpert MRSA Assay (FDA approved for NASAL specimens only), is one component of a comprehensive MRSA colonization surveillance program. It is not intended to diagnose MRSA infection nor to guide or monitor treatment for MRSA infections. Performed at [facility], [place] [address] [place], [place] [address]   Culture, blood (x 2)     Status: None (Preliminary result)   Collection Time: 05/01/20 12:38 PM   Specimen: BLOOD  Result Value Ref Range Status   Specimen Description   Final    BLOOD RIGHT ANTECUBITAL Performed at [facility] [address] [place], [place] [address]    Special Requests   Final    BOTTLES DRAWN AEROBIC ONLY Blood Culture adequate volume Performed at [facility] [address], [place], [place] [address]    Culture   Final    NO GROWTH 3 DAYS Performed at [facility], [place] [address], [place], [place] [address]    Report Status PENDING  Incomplete  Culture, blood (x 2)     Status: None (Preliminary result)   Collection Time: 05/01/20  3:21 PM   Specimen: BLOOD LEFT HAND  Result Value Ref Range Status   Specimen Description   Final     BLOOD LEFT HAND Performed at [facility], [place] [address] [place], [place] [address]    Special Requests   Final    BOTTLES DRAWN AEROBIC AND ANAEROBIC Blood Culture results may not be optimal due to an excessive volume of blood received in culture bottles Performed at [facility] [address] [place], [place] [address]    Culture   Final    NO GROWTH 3 DAYS Performed at [facility], [place] [address], [place], [place] [address]    Report Status PENDING  Incomplete    Studies/Results: No results found.    Assessment/Plan:  INTERVAL HISTORY:  O2 being weaned   Principal Problem:   Acute respiratory failure with hypoxia ([name]) Active Problems:   COPD with acute exacerbation ([place])   COVID-19 virus infection   Primary cancer of left upper lobe of lung ([name])   Cavitary pneumonia   SIADH (syndrome of inappropriate ADH production) ([place])   Essential hypertension   Mixed hyperlipidemia   Sepsis ([place])   Bullous emphysema ([name])   Necrotizing pneumonia ([name])    Terry Page. is a 65 y.o. male with complicated past medical history including COPD with bullous emphysema, left upper lobe lung cancer status post radiation therapy in 2020, history of pneumothorax, COVID-19 infection, transient coag negative staphylococcal bacteremia earlier this month who has now developed necrotizing infection and upper lobe.  He has been narrowed to Augmentin.  He had worsening hypoxia of unclear cause now stabilized in the ICU and is on corticosteroids  #1 Necrotizing upper lobe infection in the context of severe bullous emphysema:  --will plan on several weeks of oral augmentin to treat this   #2 Hypoxia: not clear what precipitated this but improving now  #3 Left upper lobe spiculated lesion concerning for cancer   LOS: 22 days   Alcide Evener 05/04/2020, 3:10 PM

## 2020-05-04 NOTE — Progress Notes (Signed)
Physical Therapy Treatment Patient Details Name: Terry Page. MRN: [id number] DOB: 07-17-1955 Today's Date: 05/04/2020    History of Present Illness 65 yo male presents with complaints of shortness of breath. Pt tested positive for COVID 04/10/20.   on 4/15 pt developed nausea and vomiting and abdominal distention.  A CT scan was ordered.  This shows a small bowel obstruction with transition point in the distal small bowel  KCL:EXNT with both emphysema, spontaneous pneumothorax (2019 and 2021), SIADH, HTN, hyperlipidemia, prostate cancer and left lung cancer (currently receiving radiation therapy).    PT Comments    Patient willing to get up, ambulated x 20' on  2 L [place], SPO2 95%. HR 112. Performed standing marching each leg x 5. Patient returned to bed.  continue PT for progressive ambulation.  Follow Up Recommendations  SNF     Equipment Recommendations  Rolling walker with 5" wheels    Recommendations for Other Services       Precautions / Restrictions Precautions Precautions: Fall Precaution Comments: Monitor O2 and HR    Mobility  Bed Mobility   Bed Mobility: Supine to Sit;Sit to Supine     Supine to sit: Supervision;HOB elevated          Transfers   Equipment used: Rolling walker (2 wheeled) Transfers: Sit to/from Stand Sit to Stand: Min guard            Ambulation/Gait Ambulation/Gait assistance: Counsellor (Feet): 20 Feet Assistive device: Rolling walker (2 wheeled) Gait Pattern/deviations: Step-through pattern     General Gait Details: sats 96% on 3 L,   Stairs             Wheelchair Mobility    Modified Rankin (Stroke Patients Only)       Balance                                            Cognition Arousal/Alertness: Awake/alert Behavior During Therapy: Flat affect Overall Cognitive Status: Within Functional Limits for tasks assessed                                 General  Comments: Pt is particular re: how things are done and does not like to be pushed.  Therapy has to be rather patient led.      Exercises General Exercises - Lower Extremity Long Arc Quad: AROM;Seated;Both;10 reps Hip Flexion/Marching: AROM;5 reps;Standing;Both    General Comments        Pertinent Vitals/Pain Pain Assessment: No/denies pain Faces Pain Scale: Hurts little more Pain Location: Back Pain Descriptors / Indicators: Discomfort Pain Intervention(s): Monitored during session;Patient requesting pain meds-RN notified    Home Living                      Prior Function            PT Goals (current goals can now be found in the care plan section) Acute Rehab PT Goals Patient Stated Goal: Not to overdo it. PT Goal Formulation: With patient Time For Goal Achievement: 05/18/20 Potential to Achieve Goals: Good Progress towards PT goals: Progressing toward goals    Frequency    Min 2X/week      PT Plan Current plan remains appropriate;Frequency needs to be updated    Co-evaluation  AM-PAC PT "6 Clicks" Mobility   Outcome Measure  Help needed turning from your back to your side while in a flat bed without using bedrails?: None Help needed moving from lying on your back to sitting on the side of a flat bed without using bedrails?: None Help needed moving to and from a bed to a chair (including a wheelchair)?: A Little Help needed standing up from a chair using your arms (e.g., wheelchair or bedside chair)?: A Little Help needed to walk in hospital room?: A Little Help needed climbing 3-5 steps with a railing? : A Lot 6 Click Score: 19    End of Session Equipment Utilized During Treatment: Oxygen Activity Tolerance: Patient tolerated treatment well Patient left: in bed;with call bell/phone within reach;with bed alarm set Nurse Communication: Mobility status PT Visit Diagnosis: Other abnormalities of gait and mobility  (R26.89);Unsteadiness on feet (R26.81)     Time: 0233-4356 PT Time Calculation (min) (ACUTE ONLY): 18 min  Charges:  $Gait Training: 8-22 mins                     Tresa Endo PT Acute Rehabilitation Services Pager [phone] Office [phone]    Claretha Cooper 05/04/2020, 2:51 PM

## 2020-05-04 NOTE — Plan of Care (Signed)
  Problem: Health Behavior/Discharge Planning: Goal: Ability to manage health-related needs will improve Outcome: Progressing   Problem: Clinical Measurements: Goal: Ability to maintain clinical measurements within normal limits will improve Outcome: Progressing Goal: Will remain free from infection Outcome: Progressing Goal: Diagnostic test results will improve Outcome: Progressing Goal: Respiratory complications will improve Outcome: Progressing Goal: Cardiovascular complication will be avoided Outcome: Progressing   Problem: Activity: Goal: Risk for activity intolerance will decrease Outcome: Progressing   Problem: Nutrition: Goal: Adequate nutrition will be maintained Outcome: Progressing   Problem: Coping: Goal: Level of anxiety will decrease Outcome: Progressing   Problem: Elimination: Goal: Will not experience complications related to urinary retention Outcome: Progressing   Problem: Pain Managment: Goal: General experience of comfort will improve Outcome: Progressing   Problem: Safety: Goal: Ability to remain free from injury will improve Outcome: Progressing   Problem: Activity: Goal: Ability to tolerate increased activity will improve Outcome: Progressing   Problem: Respiratory: Goal: Ability to maintain adequate ventilation will improve Outcome: Progressing Goal: Ability to maintain a clear airway will improve Outcome: Progressing   Problem: Coping: Goal: Psychosocial and spiritual needs will be supported Outcome: Progressing   Problem: Respiratory: Goal: Complications related to the disease process, condition or treatment will be avoided or minimized Outcome: Progressing

## 2020-05-04 NOTE — Progress Notes (Signed)
Results of bedside Ambulatory Desat Study:  Patient on room air at rest- sat 97% Patient on room air while ambulating- sat 87% Patient on 2 liters Nasal Cannula- sat 93%

## 2020-05-04 NOTE — Progress Notes (Signed)
Name: Terry Page. 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  Transferred to ICU 4/30 for respiratory distress requiring BiPAP  SIGNIFICANT EVENTS  4/30 sudden respiratory distress , saturation 84% on 5 L >> bipap 5/1 SLP eval > dysphagia 2, thin liquids  STUDIES:  Blood culture 4/9 staph hominis 1/2 Blood culture 4/23 strep mitis 1/2 CT chest with contrast 4/29 >> development of cavitary areas in the right upper lobe with multiple air-fluid levels -new compared to 4/9, airspace disease in right middle and bilateral lower lobes, small right effusion, is spiculated 11 mm left upper lobe nodule CTA chest 4/9 right upper lobe consolidation, extensive changes of bullous emphysema, left upper lobe spiculated nodule   HISTORY OF PRESENT ILLNESS:    65 year old smoker with bullous emphysema admitted 4/9 with shortness of breath found to have right upper lobe patchy infiltrates and COVID-positive.  He is unvaccinated.  He was treated with 5 days of remdesivir and 10 days of steroids, ceftriaxone and azithromycin for 5 days.  He was found to have staph hominis bacteremia 1/2 and was treated with an additional 2 days of cefazolin to complete a 7-day course.  TTE did not show any evidence of vegetation. Course was complicated by small bowel obstruction noted on CT abdomen/pelvis which improved with transient NG tube decompression He had recurrent fever 4/23, MRI did not show any evidence of discitis , respiratory status worsened 4/26 when he started requiring 3 L nasal cannula.  Treated for bronchospasm with IV Solu-Medrol and  bronchodilators CT chest was obtained on 4/29 which showed air-fluid levels in the right upper lobe, hence we are consulted for evaluation of necrotizing pneumonia On 4/30 developed sudden respiratory distress , saturation 84% on 5 L, placed on nonrebreather and due to work of breathing eventually placed on BiPAP and transferred to the ICU  PAST MEDICAL HISTORY :   05/2019 spontaneous right pneumothorax treated with chest tube History of spontaneous pneumothorax 2019 Prostate cancer status post IMRT 2020 Left upper lobe nodule treated with SBRT 06/1605 Hypermetabolic left upper lobe nodule status post SBRT 04/2020    SUBJECTIVE: Says feels a lot better.  O2 dropped from 5 to 3L, sats 97%. Doesn't feel that he needs nocturnal BiPAP and did not wear it last night.  VITAL SIGNS: Temp:  [97.4 F (36.3 C)-98.2 F (36.8 C)] 97.7 F (36.5 C) (05/03 0400) Pulse Rate:  [48-94] 76 (05/03 0700) Resp:  [16-25] 19 (05/03 0700) BP: (96-146)/(60-93) 130/83 (05/03 0700) SpO2:  [96 %-100 %] 98 % (05/03 0730)  PHYSICAL EXAMINATION: General: Adult male, resting in bed watching TV, in NAD. Neuro: A&O x 3, non-focal. HEENT: [place]/AT. Sclerae anicteric. EOMI. Cardiovascular: RRR, no M/R/G.  Lungs: Respirations even and unlabored.  CTA bilaterally, No W/R/R.  Abdomen: BS x 4, soft, NT/ND.  Musculoskeletal: No gross deformities, no edema.  Skin: Intact, warm, no rashes.   ASSESSMENT / PLAN:  Acute hypoxic respiratory failure - not sure what triggered his acute decompensation on 4/30, he denies previous aspiration, SLP cleared him for dysphagia 2 with thin liquids.   - Continue supplemental O2 as needed to maintain SpO2 > 90%. - Ambulatory desat study. -  Home O2 order placed. - D/c Solumedrol, switch to Prednisone and will order 1 week taper to off.  Acute necrotizing pneumonia right upper lobe, seems to have been concurrent with COVID illness, on review of imaging this appears to be more due to  infected bulla rather than cavitation, with air-fluid level. - Continue Augmentin for several weeks per ID. - Continue hypertonic saline and bronchial hygiene. - Mobilize as able.  Rest per primary team.  Will need outpatient follow-up to resolution, if refractory, can consider bronchoscopy however would be high risk of pneumothorax in the setting with bullous disease Follow up scheduled in our office for Wednesday 5/18 at 10:30AM with Rexene Edison, NP. Nothing else from our standpoint.  Please call if we can be of further assistance.   Montey Hora, [facility] For pager details, please see AMION or use Epic chat  After [phone], please call [place] for cross coverage needs 05/04/2020, 8:01 AM

## 2020-05-05 DIAGNOSIS — R109 Unspecified abdominal pain: Secondary | ICD-10-CM

## 2020-05-05 LAB — ASPERGILLUS ANTIBODY BY IMMUNODIFF
Aspergillus flavus: NEGATIVE
Aspergillus fumigatus, IgG: NEGATIVE
Aspergillus niger: NEGATIVE

## 2020-05-05 LAB — SARS CORONAVIRUS 2 (TAT 6-24 HRS): SARS Coronavirus 2: NEGATIVE

## 2020-05-05 LAB — BLASTOMYCES ANTIGEN: Blastomyces Antigen: NOT DETECTED ng/mL

## 2020-05-05 MED ORDER — FLORANEX PO PACK
1.0000 g | PACK | Freq: Three times a day (TID) | ORAL | Status: DC
  Administered 2020-05-06 – 2020-05-10 (×12): 1 g via ORAL
  Filled 2020-05-05 (×16): qty 1

## 2020-05-05 MED ORDER — DIPHENOXYLATE-ATROPINE 2.5-0.025 MG PO TABS
1.0000 | ORAL_TABLET | Freq: Four times a day (QID) | ORAL | Status: DC | PRN
  Administered 2020-05-05: 1 via ORAL
  Filled 2020-05-05: qty 1

## 2020-05-05 NOTE — Progress Notes (Signed)
OT Cancellation Note  Patient Details Name: Terry Page. MRN: [id number] DOB: 22-Sep-1955   Cancelled Treatment:    Reason Eval/Treat Not Completed: Patient declined, no reason specified. Patient refuses participation in therapy again today stating "I need to talk to the doctor. I'm having uncontrolled bowel movements."  Wilmer Berryhill L Voshon Petro 05/05/2020, 3:23 PM

## 2020-05-05 NOTE — Progress Notes (Signed)
Subjective:  "I want to see Dr. Cathlean Sauer"  Antibiotics:  Anti-infectives (From admission, onward)   Start     Dose/Rate Route Frequency Ordered Stop   05/01/20 2200  vancomycin (VANCOREADY) IVPB 1000 mg/200 mL  Status:  Discontinued        1,000 mg 200 mL/hr over 60 Minutes Intravenous Every 12 hours 05/01/20 0903 05/01/20 1607   05/01/20 2200  amoxicillin-clavulanate (AUGMENTIN) 875-125 MG per tablet 1 tablet        1 tablet Oral Every 12 hours 05/01/20 1701     05/01/20 1000  vancomycin (VANCOREADY) IVPB 1250 mg/250 mL        1,250 mg 166.7 mL/hr over 90 Minutes Intravenous  Once 05/01/20 0903 05/01/20 1143   04/30/20 1800  Ampicillin-Sulbactam (UNASYN) 3 g in sodium chloride 0.9 % 100 mL IVPB  Status:  Discontinued        3 g 200 mL/hr over 30 Minutes Intravenous Every 6 hours 04/30/20 1555 05/01/20 1701   04/27/20 0600  vancomycin (VANCOREADY) IVPB 1000 mg/200 mL  Status:  Discontinued        1,000 mg 200 mL/hr over 60 Minutes Intravenous Every 12 hours 04/26/20 1329 04/30/20 1037   04/26/20 2200  ceFEPIme (MAXIPIME) 2 g in sodium chloride 0.9 % 100 mL IVPB  Status:  Discontinued        2 g 200 mL/hr over 30 Minutes Intravenous Every 8 hours 04/26/20 1329 04/30/20 1545   04/26/20 1400  vancomycin (VANCOREADY) IVPB 1500 mg/300 mL        1,500 mg 150 mL/hr over 120 Minutes Intravenous  Once 04/26/20 1306 04/26/20 2008   04/26/20 1400  ceFEPIme (MAXIPIME) 2 g in sodium chloride 0.9 % 100 mL IVPB        2 g 200 mL/hr over 30 Minutes Intravenous  Once 04/26/20 1306 04/26/20 1536   04/15/20 1400  ceFAZolin (ANCEF) IVPB 2g/100 mL premix        2 g 200 mL/hr over 30 Minutes Intravenous Every 8 hours 04/15/20 0922 04/17/20 0000   04/13/20 0600  vancomycin (VANCOREADY) IVPB 1250 mg/250 mL  Status:  Discontinued        1,250 mg 166.7 mL/hr over 90 Minutes Intravenous Every 12 hours 04/12/20 1347 04/15/20 0922   04/12/20 1430  vancomycin (VANCOREADY) IVPB 1500 mg/300 mL         1,500 mg 150 mL/hr over 120 Minutes Intravenous  Once 04/12/20 1342 04/12/20 1702   04/11/20 2000  cefTRIAXone (ROCEPHIN) 2 g in sodium chloride 0.9 % 100 mL IVPB  Status:  Discontinued        2 g 200 mL/hr over 30 Minutes Intravenous Every 24 hours 04/10/20 2119 04/15/20 0922   04/11/20 1000  remdesivir 100 mg in sodium chloride 0.9 % 100 mL IVPB       "Followed by" Linked Group Details   100 mg 200 mL/hr over 30 Minutes Intravenous Daily 04/10/20 2011 04/14/20 1100   04/10/20 2200  remdesivir 200 mg in sodium chloride 0.9% 250 mL IVPB       "Followed by" Linked Group Details   200 mg 580 mL/hr over 30 Minutes Intravenous Once 04/10/20 2011 04/11/20 0002   04/10/20 1915  cefTRIAXone (ROCEPHIN) 2 g in sodium chloride 0.9 % 100 mL IVPB  Status:  Discontinued        2 g 200 mL/hr over 30 Minutes Intravenous Every 24 hours 04/10/20 1909 04/10/20 2119   04/10/20  1915  azithromycin (ZITHROMAX) 500 mg in sodium chloride 0.9 % 250 mL IVPB  Status:  Discontinued        500 mg 250 mL/hr over 60 Minutes Intravenous Every 24 hours 04/10/20 1909 04/15/20 9629      Medications: Scheduled Meds: . amLODipine  10 mg Oral Daily  . amoxicillin-clavulanate  1 tablet Oral Q12H  . aspirin EC  81 mg Oral Daily  . chlorhexidine  15 mL Mouth Rinse BID  . enoxaparin (LOVENOX) injection  40 mg Subcutaneous Q24H  . guaiFENesin  600 mg Oral BID  . ipratropium-albuterol  3 mL Nebulization TID  . irbesartan  300 mg Oral Daily  . mouth rinse  15 mL Mouth Rinse q12n4p  . polyethylene glycol  17 g Oral BID  . [START ON 05/06/2020] predniSONE  30 mg Oral Q breakfast   Followed by  . [START ON 05/08/2020] predniSONE  20 mg Oral Q breakfast   Followed by  . [START ON 05/10/2020] predniSONE  10 mg Oral Q breakfast  . senna  1 tablet Oral QHS   Continuous Infusions: PRN Meds:.acetaminophen, albuterol, alum & mag hydroxide-simeth, guaiFENesin-dextromethorphan, melatonin, morphine injection, ondansetron **OR**  ondansetron (ZOFRAN) IV, phenol, sodium chloride    Objective: Weight change:   Intake/Output Summary (Last 24 hours) at 05/05/2020 1619 Last data filed at 05/05/2020 1100 Gross per 24 hour  Intake 360 ml  Output 450 ml  Net -90 ml   Blood pressure 117/75, pulse 96, temperature 98.9 F (37.2 C), temperature source Oral, resp. rate 17, height 5\' 9"  (1.753 m), weight 69.3 kg, SpO2 96 %. Temp:  [97.7 F (36.5 C)-98.9 F (37.2 C)] 98.9 F (37.2 C) (05/04 1326) Pulse Rate:  [73-96] 96 (05/04 1326) Resp:  [16-18] 17 (05/04 1326) BP: (113-143)/(75-90) 117/75 (05/04 1326) SpO2:  [96 %-100 %] 96 % (05/04 1412)  Physical Exam: Physical Exam HENT:     Head: Normocephalic.  Eyes:     Extraocular Movements: Extraocular movements intact.  Cardiovascular:     Rate and Rhythm: Tachycardia present.     Heart sounds: No murmur heard. No friction rub. No gallop.   Pulmonary:     Effort: No respiratory distress.     Breath sounds: No stridor. Examination of the right-upper field reveals decreased breath sounds. Decreased breath sounds present.  Abdominal:     General: There is distension.     Palpations: There is no mass.     Tenderness: There is no abdominal tenderness. There is no rebound.  Musculoskeletal:     Cervical back: Normal range of motion.  Skin:    General: Skin is warm.  Neurological:     General: No focal deficit present.     Mental Status: He is alert.  Psychiatric:        Attention and Perception: Attention normal.        Mood and Affect: Mood is depressed.        Behavior: Behavior normal.        Thought Content: Thought content normal.        Judgment: Judgment normal.      CBC:    BMET Recent Labs    05/03/20 0240  NA 132*  K 3.4*  CL 100  CO2 25  GLUCOSE 112*  BUN 19  CREATININE 0.45*  CALCIUM 8.8*     Liver Panel  No results for input(s): PROT, ALBUMIN, AST, ALT, ALKPHOS, BILITOT, BILIDIR, IBILI in the last 72 hours.  Sedimentation  Rate No results for input(s): ESRSEDRATE in the last 72 hours. C-Reactive Protein No results for input(s): CRP in the last 72 hours.  Micro Results: Recent Results (from the past 720 hour(s))  Resp Panel by RT-PCR (Flu A&B, Covid) Nasopharyngeal Swab     Status: Abnormal   Collection Time: 04/10/20  5:35 PM   Specimen: Nasopharyngeal Swab; Nasopharyngeal(NP) swabs in vial transport medium  Result Value Ref Range Status   SARS Coronavirus 2 by RT PCR POSITIVE (A) NEGATIVE Final    Comment: RESULT CALLED TO, READ BACK BY AND VERIFIED WITH: JOHN FRICKUY RN04/09/22 @1916  BY P.HENDERSON (NOTE) SARS-CoV-2 target nucleic acids are DETECTED.  The SARS-CoV-2 RNA is generally detectable in upper respiratory specimens during the acute phase of infection. Positive results are indicative of the presence of the identified virus, but do not rule out bacterial infection or co-infection with other pathogens not detected by the test. Clinical correlation with patient history and other diagnostic information is necessary to determine patient infection status. The expected result is Negative.  Fact Sheet for Patients: EntrepreneurPulse.com.au  Fact Sheet for Healthcare Providers: IncredibleEmployment.be  This test is not yet approved or cleared by the [place] FDA and  has been authorized for detection and/or diagnosis of SARS-CoV-2 by FDA under an Emergency Use Authorization (EUA).  This EUA will remain in effect (meaning this  test can be used) for the duration of  the COVID-19 declaration under Section 564(b)(1) of the Act, 21 U.S.C. section 360bbb-3(b)(1), unless the authorization is terminated or revoked sooner.     Influenza A by PCR NEGATIVE NEGATIVE Final   Influenza B by PCR NEGATIVE NEGATIVE Final    Comment: (NOTE) The Xpert Xpress SARS-CoV-2/FLU/RSV plus assay is intended as an aid in the diagnosis of influenza from Nasopharyngeal swab  specimens and should not be used as a sole basis for treatment. Nasal washings and aspirates are unacceptable for Xpert Xpress SARS-CoV-2/FLU/RSV testing.  Fact Sheet for Patients: EntrepreneurPulse.com.au  Fact Sheet for Healthcare Providers: IncredibleEmployment.be  This test is not yet approved or cleared by the [place] FDA and has been authorized for detection and/or diagnosis of SARS-CoV-2 by FDA under an Emergency Use Authorization (EUA). This EUA will remain in effect (meaning this test can be used) for the duration of the COVID-19 declaration under Section 564(b)(1) of the Act, 21 U.S.C. section 360bbb-3(b)(1), unless the authorization is terminated or revoked.  Performed at [facility], [place] [address], [place], [place] [address]   Urine culture     Status: Abnormal   Collection Time: 04/10/20  8:51 PM   Specimen: In/Out Cath Urine  Result Value Ref Range Status   Specimen Description   Final    IN/OUT CATH URINE Performed at [facility] [address], [place], [place] [address]    Special Requests   Final    NONE Performed at [facility], [place] [address], [place], [place] [address]    Culture (A)  Final    <10,000 COLONIES/mL INSIGNIFICANT GROWTH Performed at [facility] [address], [place], [place] [address]    Report Status 04/12/2020 FINAL  Final  Culture, blood (single)     Status: Abnormal   Collection Time: 04/10/20  8:53 PM   Specimen: BLOOD  Result Value Ref Range Status   Specimen Description   Final    BLOOD LEFT ANTECUBITAL Performed at [facility] [address] [place], [place] [address]  Special Requests   Final    BOTTLES DRAWN AEROBIC AND ANAEROBIC Blood Culture adequate volume Performed at [facility] [address] [place], [place] [address]    Culture  Setup Time   Final    GRAM  POSITIVE COCCI IN BOTH AEROBIC AND ANAEROBIC BOTTLES CRITICAL RESULT CALLED TO, READ BACK BY AND VERIFIED WITH: PHARMD ELLEN JACKSON 04/11/2020 @ 2249 A.HUGHES Performed at [facility], [place] [address] [place], [place] [address]    Culture STAPHYLOCOCCUS HOMINIS (A)  Final   Report Status 04/13/2020 FINAL  Final   Organism ID, Bacteria STAPHYLOCOCCUS HOMINIS  Final      Susceptibility   Staphylococcus hominis - MIC*    CIPROFLOXACIN <=0.5 SENSITIVE Sensitive     ERYTHROMYCIN <=0.25 SENSITIVE Sensitive     GENTAMICIN <=0.5 SENSITIVE Sensitive     OXACILLIN <=0.25 SENSITIVE Sensitive     TETRACYCLINE <=1 SENSITIVE Sensitive     VANCOMYCIN <=0.5 SENSITIVE Sensitive     TRIMETH/SULFA <=10 SENSITIVE Sensitive     CLINDAMYCIN <=0.25 SENSITIVE Sensitive     RIFAMPIN <=0.5 SENSITIVE Sensitive     Inducible Clindamycin NEGATIVE Sensitive     * STAPHYLOCOCCUS HOMINIS  Blood Culture ID Panel (Reflexed)     Status: Abnormal   Collection Time: 04/10/20  8:53 PM  Result Value Ref Range Status   Enterococcus faecalis NOT DETECTED NOT DETECTED Final   Enterococcus Faecium NOT DETECTED NOT DETECTED Final   Listeria monocytogenes NOT DETECTED NOT DETECTED Final   Staphylococcus species DETECTED (A) NOT DETECTED Final    Comment: CRITICAL RESULT CALLED TO, READ BACK BY AND VERIFIED WITH: PHARMD ELLEN JACKSON 04/11/2020 @ 2249 A.HUGHES    Staphylococcus aureus (BCID) NOT DETECTED NOT DETECTED Final   Staphylococcus epidermidis NOT DETECTED NOT DETECTED Final   Staphylococcus lugdunensis NOT DETECTED NOT DETECTED Final   Streptococcus species NOT DETECTED NOT DETECTED Final   Streptococcus agalactiae NOT DETECTED NOT DETECTED Final   Streptococcus pneumoniae NOT DETECTED NOT DETECTED Final   Streptococcus pyogenes NOT DETECTED NOT DETECTED Final   A.calcoaceticus-baumannii NOT DETECTED NOT DETECTED Final   Bacteroides fragilis NOT DETECTED NOT DETECTED Final   Enterobacterales NOT DETECTED  NOT DETECTED Final   Enterobacter cloacae complex NOT DETECTED NOT DETECTED Final   Escherichia coli NOT DETECTED NOT DETECTED Final   Klebsiella aerogenes NOT DETECTED NOT DETECTED Final   Klebsiella oxytoca NOT DETECTED NOT DETECTED Final   Klebsiella pneumoniae NOT DETECTED NOT DETECTED Final   Proteus species NOT DETECTED NOT DETECTED Final   Salmonella species NOT DETECTED NOT DETECTED Final   Serratia marcescens NOT DETECTED NOT DETECTED Final   Haemophilus influenzae NOT DETECTED NOT DETECTED Final   Neisseria meningitidis NOT DETECTED NOT DETECTED Final   Pseudomonas aeruginosa NOT DETECTED NOT DETECTED Final   Stenotrophomonas maltophilia NOT DETECTED NOT DETECTED Final   Candida albicans NOT DETECTED NOT DETECTED Final   Candida auris NOT DETECTED NOT DETECTED Final   Candida glabrata NOT DETECTED NOT DETECTED Final   Candida krusei NOT DETECTED NOT DETECTED Final   Candida parapsilosis NOT DETECTED NOT DETECTED Final   Candida tropicalis NOT DETECTED NOT DETECTED Final   Cryptococcus neoformans/gattii NOT DETECTED NOT DETECTED Final    Comment: Performed at [facility], [address] [place], [place] [address]  Blood Culture (routine x 2)     Status: Abnormal   Collection Time: 04/10/20  8:55 PM   Specimen: BLOOD  Result Value Ref Range Status  Specimen Description   Final    BLOOD RIGHT ANTECUBITAL Performed at [facility] [address] [place], [place] [address]    Special Requests   Final    BOTTLES DRAWN AEROBIC AND ANAEROBIC Blood Culture adequate volume Performed at [facility] [address] [place], [place] [address]    Culture  Setup Time   Final    GRAM POSITIVE COCCI AEROBIC BOTTLE ONLY CRITICAL RESULT CALLED TO, READ BACK BY AND VERIFIED WITH: PHARMD Dolly Rias 04/11/2020 @2249  A.HUGHES Performed at [facility], [place] [address] [place], [place] [address]    Culture STAPHYLOCOCCUS HOMINIS (A)   Final   Report Status 04/15/2020 FINAL  Final   Organism ID, Bacteria STAPHYLOCOCCUS HOMINIS  Final      Susceptibility   Staphylococcus hominis - MIC*    CIPROFLOXACIN <=0.5 SENSITIVE Sensitive     ERYTHROMYCIN <=0.25 SENSITIVE Sensitive     GENTAMICIN <=0.5 SENSITIVE Sensitive     OXACILLIN <=0.25 SENSITIVE Sensitive     TETRACYCLINE 2 SENSITIVE Sensitive     VANCOMYCIN 1 SENSITIVE Sensitive     TRIMETH/SULFA <=10 SENSITIVE Sensitive     CLINDAMYCIN <=0.25 SENSITIVE Sensitive     RIFAMPIN <=0.5 SENSITIVE Sensitive     Inducible Clindamycin NEGATIVE Sensitive     * STAPHYLOCOCCUS HOMINIS  Blood Culture (routine x 2)     Status: None   Collection Time: 04/10/20  9:53 PM   Specimen: BLOOD  Result Value Ref Range Status   Specimen Description   Final    BLOOD BLOOD RIGHT HAND Performed at [facility] [address], [place], [place] [address]    Special Requests   Final    BOTTLES DRAWN AEROBIC ONLY Blood Culture adequate volume Performed at [facility] [address], [place], [place] [address]    Culture   Final    NO GROWTH 5 DAYS Performed at [facility], [place] [address] [place], [place] [address]    Report Status 04/16/2020 FINAL  Final  Culture, blood (Routine X 2) w Reflex to ID Panel     Status: None   Collection Time: 04/13/20  6:35 AM   Specimen: BLOOD  Result Value Ref Range Status   Specimen Description   Final    BLOOD RIGHT ANTECUBITAL Performed at [facility] [address], [place], [place] [address]    Special Requests   Final    BOTTLES DRAWN AEROBIC AND ANAEROBIC Blood Culture adequate volume Performed at [facility] [address] [place], [place] [address]    Culture   Final    NO GROWTH 5 DAYS Performed at [facility], [place] [address] [place], [place] [address]    Report Status 04/18/2020 FINAL  Final  Culture, blood (Routine X 2) w Reflex to ID  Panel     Status: None   Collection Time: 04/13/20  6:35 AM   Specimen: BLOOD RIGHT HAND  Result Value Ref Range Status   Specimen Description   Final    BLOOD RIGHT HAND Performed at [facility] [address], [place], [place] [address]    Special Requests   Final    BOTTLES DRAWN AEROBIC ONLY Blood Culture adequate volume Performed at [facility] [place] [address], [place], [place] [address]    Culture   Final    NO GROWTH 5 DAYS Performed at [facility], [address], [place], [place] [address]    Report Status 04/18/2020 FINAL  Final  Culture, blood (routine x 2)     Status: None   Collection Time: 04/24/20  9:47 AM   Specimen: BLOOD  Result Value Ref Range Status   Specimen Description   Final    BLOOD LEFT ANTECUBITAL Performed at [facility] [address], [place], [place] [address]    Special Requests   Final    BOTTLES DRAWN AEROBIC AND ANAEROBIC Blood Culture adequate volume Performed at [facility] [address] [place], [place] [address]    Culture   Final    NO GROWTH 5 DAYS Performed at [facility], [place] [address] [place], [place] [address]    Report Status 04/29/2020 FINAL  Final  Culture, blood (routine x 2)     Status: Abnormal   Collection Time: 04/24/20  9:47 AM   Specimen: BLOOD LEFT HAND  Result Value Ref Range Status   Specimen Description   Final    BLOOD LEFT HAND Performed at [facility] [address] [place], [place] [address]    Special Requests   Final    BOTTLES DRAWN AEROBIC AND ANAEROBIC Blood Culture adequate volume Performed at [facility] [address], [place], [place] [address]    Culture  Setup Time   Final    GRAM POSITIVE COCCI ANAEROBIC BOTTLE ONLY CRITICAL VALUE NOTED.  VALUE IS CONSISTENT WITH PREVIOUSLY REPORTED AND CALLED VALUE.    Culture (A)  Final    STREPTOCOCCUS  MITIS/ORALIS THE SIGNIFICANCE OF ISOLATING THIS ORGANISM FROM A SINGLE SET OF BLOOD CULTURES WHEN MULTIPLE SETS ARE DRAWN IS UNCERTAIN. PLEASE NOTIFY THE MICROBIOLOGY DEPARTMENT WITHIN ONE WEEK IF SPECIATION AND SENSITIVITIES ARE REQUIRED. Performed at [facility], [place] [address], [place], [place] [address]    Report Status 04/27/2020 FINAL  Final  MRSA PCR Screening     Status: None   Collection Time: 04/29/20  5:41 AM   Specimen: Nasal Mucosa; Nasopharyngeal  Result Value Ref Range Status   MRSA by PCR NEGATIVE NEGATIVE Final    Comment:        The GeneXpert MRSA Assay (FDA approved for NASAL specimens only), is one component of a comprehensive MRSA colonization surveillance program. It is not intended to diagnose MRSA infection nor to guide or monitor treatment for MRSA infections. Performed at [facility], [place] [address], [place], [place] [address]   Culture, blood (x 2)     Status: None (Preliminary result)   Collection Time: 05/01/20 12:38 PM   Specimen: BLOOD  Result Value Ref Range Status   Specimen Description   Final    BLOOD RIGHT ANTECUBITAL Performed at [facility] [address], [place], [place] [address]    Special Requests   Final    BOTTLES DRAWN AEROBIC ONLY Blood Culture adequate volume Performed at [facility] [address] [place], [place] [address]    Culture   Final    NO GROWTH 4 DAYS Performed at [facility], [place] [address], [place], [place] [address]    Report Status PENDING  Incomplete  Culture, blood (x 2)     Status: None (Preliminary result)   Collection Time: 05/01/20  3:21 PM   Specimen: BLOOD LEFT HAND  Result Value Ref Range Status   Specimen Description   Final  BLOOD LEFT HAND Performed at [facility], [place] [address] [place], [place] [address]    Special Requests   Final    BOTTLES DRAWN AEROBIC AND ANAEROBIC Blood Culture results may not be  optimal due to an excessive volume of blood received in culture bottles Performed at [facility] [address] [place], [place] [address]    Culture   Final    NO GROWTH 4 DAYS Performed at [facility], [place] [address], [place], [place] [address]    Report Status PENDING  Incomplete  Blastomyces Antigen     Status: None   Collection Time: 05/01/20  5:19 PM   Specimen: Blood  Result Value Ref Range Status   Blastomyces Antigen None Detected None Detected ng/mL Final    Comment: (NOTE) Results reported as ng/mL in 0.2 - 14.7 ng/mL range Results above the limit of detection but below 0.2 ng/mL are reported as 'Positive, Below the Limit of Quantification' Results above 14.7 ng/mL are reported as 'Positive, Above the Limit of Quantification'    Specimen Type SERUM  Final    Comment: (NOTE) Performed At: [facility] [place], [place] [id number] Bruce Donath MD NO:[id number]     Studies/Results: No results found.    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 is a 65 y.o. male with complicated past medical history including COPD with bullous emphysema, left upper lobe lung cancer status post radiation therapy in 2020, history of pneumothorax, COVID-19 infection, transient coag negative staphylococcal bacteremia earlier this month who has now developed necrotizing infection and upper lobe.  He has been narrowed to Augmentin.  He had worsening hypoxia of  unclear cause now stabilized in the ICU and is on corticosteroids  #1 Necrotizing upper lobe infection in the context of severe bullous emphysema:  --will plan on 4 weeks of oral augmentin to treat this   #2 Hypoxia: not clear what precipitated this but improving now  #3 Left upper lobe spiculated lesion concerning for cancer  I will sign off for now  If patient would like to followup with [place] I would be happy to see him at [facility].   LOS: 23 days   Alcide Evener 05/05/2020, 4:19 PM

## 2020-05-05 NOTE — TOC Progression Note (Signed)
Transition of Care (TOC) - Progression Note    Patient Details  Name: Terry Page. MRN: [id number] Date of Birth: 1955/10/19  Transition of Care [facility]) CM/SW Contact  Camia Dipinto, Juliann Pulse, RN Phone Number: 05/05/2020, 9:51 AM  Clinical Narrative: Spoke to patient about d/c plans-going to [facility] for SNF-per Maudie Mercury she will start auth. Await auth. Will need covid test.      Expected Discharge Plan: Skilled Nursing Facility Barriers to Discharge: Insurance Authorization  Expected Discharge Plan and Services Expected Discharge Plan: [place]   Discharge Planning Services: CM Consult   Living arrangements for the past 2 months: Apartment                 DME Arranged: Oxygen DME Agency: [employer] Date DME Agency Contacted: 05/04/20 Time DME Agency Contacted: 2091               Social Determinants of Health (SDOH) Interventions    Readmission Risk Interventions Readmission Risk Prevention Plan 10/24/2017  Post Dischage Appt Complete  Medication Screening Complete  Transportation Screening Complete  PCP follow-up Complete  Some recent data might be hidden

## 2020-05-05 NOTE — Progress Notes (Signed)
PROGRESS NOTE    Terry Page.  WPV:[id number] DOB: 15-Aug-1955 DOA: 04/10/2020 PCP: [name], MD    Brief Narrative:  Mr. Pittinger was admitted to the hospital with the working diagnosis of acute hypoxic respiratory failure secondary to SARS COVID-19 viral pneumonia.   Hospitalization complicated by COPD exacerbation, healthcare associated pneumonia, necrotizing pneumonia and small bowel obstruction.  65 year old male past medical history for COPD, spontaneous pneumothorax 2019-2021, hypertension, SIADH, dyslipidemia, prostate cancer, left lung cancer who presented with worsening dyspnea.  Patient reported rapid onset of progressive dyspnea, severe in intensity, associated with nonproductive cough and generalized weakness.  On his initial evaluation patient was found in respiratory distress.  Blood pressure 145/96, heart rate 107, respiratory 25, temp 97.9, oxygen saturation 96%, his lungs had scattered rhonchi bilaterally, positive rales, and expiratory wheezing.  Prolonged expiratory phase.  Significant increased work of breathing, heart S1-S2, tachycardic, abdomen soft nontender, no lower extremity edema.  Patient tested positive for COVID-19, his chest radiograph had a multifocal infiltrates.  CT chest negative for pulmonary embolism. He was severely hyponatremic with sodium 119, and severe leukocytosis 36.3.  Patient had a prolonged hospitalization, complicated with small bowel obstruction, COPD exacerbation and healthcare associated pneumonia. He did require noninvasive mechanical ventilation in the stepdown unit.  Slowly he has been improving, continue to be very weak and deconditioned, likely will need skilled nursing facility.    Assessment & Plan:   Principal Problem:   Acute respiratory failure with hypoxia ([name]) Active Problems:   COPD with acute exacerbation ([name])   Pneumonia due to COVID-19 virus   Primary cancer of left upper lobe of lung ([name])   Cavitary  pneumonia   SIADH (syndrome of inappropriate ADH production) ([name])   Essential hypertension   Mixed hyperlipidemia   Sepsis ([name])   Bullous emphysema ([name])   Necrotizing pneumonia ([name])   Abdominal pain   1.  Acute hypoxic respiratory failure, SARS COVID-19 viral pneumonia, complicated with healthcare associated pneumonia, multifocal. Dyspnea continue to be stable, oxygenation is 96% on 2 L./min per [place]. Patient continue to be very weak and deconditioned, not yet back to his baseline.   Continue antibiotic therapy with Augmentin.  Continue with prednisone taper  2.  COPD exacerbation. Continue with bronchodilator therapy, and supplemental 02 per [place].   3.  SIADH, serum Na has been stable, continue close monitoring, patient is tolerating po well.   4.  Staph hominis bacteremia. Continue with Augmentin   5.  Resolved small bowel obstruction. Clinically resolves, no nausea or vomiting, no chest pain,   6.  Left upper lobe lung cancer. Follow up as  Outpatient   7. HTN. Continue blood pressure control with irbesartan  Status is: Inpatient  Remains inpatient appropriate because:Unsafe d/c plan   Dispo:  Patient From: Home  Planned Disposition: [place]  Medically stable for discharge: No     DVT prophylaxis: Enoxaparin   Code Status:   full  Family Communication:  No family at the bedside       Consultants:   ID     Subjective: Patient is feeling well but continue to be very weak and deconditioned, not yet back to his baseline, continue to have loose stools after meals, no nausea or vomiting,.   Objective: Vitals:   05/05/20 0509 05/05/20 0848 05/05/20 1326 05/05/20 1412  BP: 131/75  117/75   Pulse: 73  96   Resp: 18  17   Temp: 97.7 F (36.5 C)  98.9  F (37.2 C)   TempSrc: Oral  Oral   SpO2: 98% 98% 99% 96%  Weight:      Height:        Intake/Output Summary (Last 24 hours) at 05/05/2020 1621 Last data filed at 05/05/2020 1100 Gross per 24  hour  Intake 360 ml  Output 450 ml  Net -90 ml   Filed Weights   04/10/20 1710 04/10/20 2120 05/01/20 1257  Weight: 76.2 kg 76.2 kg 69.3 kg    Examination:   General: Not in pain or dyspnea, deconditioned  Neurology: Awake and alert, non focal  E ENT: no pallor, no icterus, oral mucosa moist Cardiovascular: No JVD. S1-S2 present, rhythmic, no gallops, rubs, or murmurs. No lower extremity edema. Pulmonary: positive breath sounds bilaterally,  Gastrointestinal. Abdomen soft and non tender Skin. No rashes Musculoskeletal: no joint deformities     Data Reviewed: I have personally reviewed following labs and imaging studies  CBC: Recent Labs  Lab 04/30/20 0351 05/01/20 1238 05/02/20 0221  WBC 11.4* 13.3* 9.9  NEUTROABS 9.7*  --   --   HGB 9.4* 9.9* 9.6*  HCT 27.6* 29.9* 28.5*  MCV 85.7 87.9 86.4  PLT 377 397 782   Basic Metabolic Panel: Recent Labs  Lab 04/30/20 0351 05/01/20 1238 05/01/20 1719 05/02/20 0221 05/03/20 0240  NA 130* 131*  --  137 132*  K 3.7 3.3*  --  4.5 3.4*  CL 99 98  --  103 100  CO2 23 23  --  25 25  GLUCOSE 102* 149*  --  151* 112*  BUN 15 17  --  16 19  CREATININE 0.43* 0.53*  --  0.53* 0.45*  CALCIUM 9.0 8.6*  --  9.0 8.8*  MG  --   --  1.5*  --   --    GFR: Estimated Creatinine Clearance: 89 mL/min (A) (by C-G formula based on SCr of 0.45 mg/dL (L)). Liver Function Tests: No results for input(s): AST, ALT, ALKPHOS, BILITOT, PROT, ALBUMIN in the last 168 hours. No results for input(s): LIPASE, AMYLASE in the last 168 hours. No results for input(s): AMMONIA in the last 168 hours. Coagulation Profile: Recent Labs  Lab 05/01/20 1238  INR 1.2   Cardiac Enzymes: No results for input(s): CKTOTAL, CKMB, CKMBINDEX, TROPONINI in the last 168 hours. BNP (last 3 results) No results for input(s): PROBNP in the last 8760 hours. HbA1C: No results for input(s): HGBA1C in the last 72 hours. CBG: No results for input(s): GLUCAP in the last  168 hours. Lipid Profile: No results for input(s): CHOL, HDL, LDLCALC, TRIG, CHOLHDL, LDLDIRECT in the last 72 hours. Thyroid Function Tests: No results for input(s): TSH, T4TOTAL, FREET4, T3FREE, THYROIDAB in the last 72 hours. Anemia Panel: No results for input(s): VITAMINB12, FOLATE, FERRITIN, TIBC, IRON, RETICCTPCT in the last 72 hours.    Radiology Studies: I have reviewed all of the imaging during this hospital visit personally     Scheduled Meds: . amLODipine  10 mg Oral Daily  . amoxicillin-clavulanate  1 tablet Oral Q12H  . aspirin EC  81 mg Oral Daily  . chlorhexidine  15 mL Mouth Rinse BID  . enoxaparin (LOVENOX) injection  40 mg Subcutaneous Q24H  . guaiFENesin  600 mg Oral BID  . ipratropium-albuterol  3 mL Nebulization TID  . irbesartan  300 mg Oral Daily  . mouth rinse  15 mL Mouth Rinse q12n4p  . polyethylene glycol  17 g Oral BID  . [START ON  05/06/2020] predniSONE  30 mg Oral Q breakfast   Followed by  . [START ON 05/08/2020] predniSONE  20 mg Oral Q breakfast   Followed by  . [START ON 05/10/2020] predniSONE  10 mg Oral Q breakfast  . senna  1 tablet Oral QHS   Continuous Infusions:   LOS: 23 days        Oluwatoni Rotunno Gerome Apley, MD

## 2020-05-06 LAB — CULTURE, BLOOD (ROUTINE X 2)
Culture: NO GROWTH
Culture: NO GROWTH
Special Requests: ADEQUATE

## 2020-05-06 LAB — BASIC METABOLIC PANEL
Anion gap: 10 (ref 5–15)
BUN: 12 mg/dL (ref 8–23)
CO2: 25 mmol/L (ref 22–32)
Calcium: 8.5 mg/dL — ABNORMAL LOW (ref 8.9–10.3)
Chloride: 97 mmol/L — ABNORMAL LOW (ref 98–111)
Creatinine, Ser: 0.37 mg/dL — ABNORMAL LOW (ref 0.61–1.24)
GFR, Estimated: >60 mL/min (ref >60)
Glucose, Bld: 118 mg/dL — ABNORMAL HIGH (ref 70–99)
Potassium: 3.7 mmol/L (ref 3.5–5.1)
Sodium: 132 mmol/L — ABNORMAL LOW (ref 135–145)

## 2020-05-06 LAB — MAGNESIUM: Magnesium: 1.4 mg/dL — ABNORMAL LOW (ref 1.7–2.4)

## 2020-05-06 MED ORDER — MAGNESIUM SULFATE 2 GM/50ML IV SOLN
2.0000 g | Freq: Once | INTRAVENOUS | Status: AC
  Administered 2020-05-06: 2 g via INTRAVENOUS
  Filled 2020-05-06: qty 50

## 2020-05-06 MED ORDER — PANCRELIPASE (LIP-PROT-AMYL) 12000-38000 UNITS PO CPEP
12000.0000 [IU] | ORAL_CAPSULE | Freq: Three times a day (TID) | ORAL | Status: DC
  Administered 2020-05-06 – 2020-05-10 (×11): 12000 [IU] via ORAL
  Filled 2020-05-06 (×13): qty 1

## 2020-05-06 NOTE — TOC Progression Note (Addendum)
Transition of Care (TOC) - Progression Note    Patient Details  Name: Terry Page. MRN: [id number] Date of Birth: [birth year]  Transition of Care [facility]) CM/SW Contact  Varina Hulon, Juliann Pulse, RN Phone Number: 05/06/2020, 12:17 PM  Clinical Narrative:  Left vm w/[employer] rep Kim awaiting call for update if has received auth. Patient medically stable. covid neg 5/4. 12:30p-Per [employer] rep Kim awaiting auth.No new covid test will be needed.    Expected Discharge Plan: Skilled Nursing Facility Barriers to Discharge: Insurance Authorization  Expected Discharge Plan and Services Expected Discharge Plan: [place]   Discharge Planning Services: CM Consult   Living arrangements for the past 2 months: Apartment                 DME Arranged: Oxygen DME Agency: [employer] Date DME Agency Contacted: 05/04/20 Time DME Agency Contacted: [id number]               Social Determinants of Health (SDOH) Interventions    Readmission Risk Interventions Readmission Risk Prevention Plan 10/24/2017  Post Dischage Appt Complete  Medication Screening Complete  Transportation Screening Complete  PCP follow-up Complete  Some recent data might be hidden

## 2020-05-06 NOTE — Progress Notes (Signed)
PHYSICAL THERAPY  Pt declined any OOB activity "this is just not a good day".  Stressed importance of getting OOB and "moving" would help.  Pt was adamant.  Pt has been evaluated with rec for SNF but will continue to attempt to see.  Rica Koyanagi  PTA Acute  Rehabilitation Services Pager      [phone] Office      [phone]

## 2020-05-06 NOTE — Progress Notes (Signed)
PROGRESS NOTE    Terry Page.  [id number]:[id number] DOB: [birth year] DOA: 04/10/2020 PCP: [name], MD    Brief Narrative:  Terry Page was admitted to the hospital with the working diagnosis of acute hypoxic respiratory failure secondary to SARS COVID-19 viral pneumonia.   Hospitalization complicated by COPD exacerbation, healthcare associated pneumonia, necrotizing pneumonia and small bowel obstruction.  65 year old male past medical history for COPD, spontaneous pneumothorax 2019-2021, hypertension, SIADH, dyslipidemia, prostate cancer, left lung cancer who presented with worsening dyspnea.  Patient reported rapid onset of progressive dyspnea, severe in intensity, associated with nonproductive cough and generalized weakness.  On his initial evaluation patient was found in respiratory distress.  Blood pressure 145/96, heart rate 107, respiratory rate 25, temp 97.9, oxygen saturation 96%, his lungs had scattered rhonchi bilaterally, positive rales, and expiratory wheezing.  Prolonged expiratory phase.  Significant increased work of breathing, heart S1-S2 present, tachycardic, abdomen soft nontender, no lower extremity edema.  Patient tested positive for COVID-19, his chest radiograph had a multifocal infiltrates.  CT chest negative for pulmonary embolism. He was severely hyponatremic with sodium 119, and severe leukocytosis 36.3.  Patient had a prolonged hospitalization, complicated with small bowel obstruction, COPD exacerbation and healthcare associated pneumonia. He did require noninvasive mechanical ventilation in the stepdown unit.  Slowly he has been improving, continue to be very weak and deconditioned, pending transfer to SNF to continue therapy.   Assessment & Plan:   Principal Problem:   Acute respiratory failure with hypoxia ([name]) Active Problems:   COPD with acute exacerbation ([name])   Pneumonia due to COVID-19 virus   Primary cancer of left upper lobe of lung ([name])    Cavitary pneumonia   SIADH (syndrome of inappropriate ADH production) ([name])   Essential hypertension   Mixed hyperlipidemia   Sepsis ([name])   Bullous emphysema ([name])   Necrotizing pneumonia ([name])   Abdominal pain    1.  Acute hypoxic respiratory failure, SARS COVID-19 viral pneumonia, complicated with healthcare associated pneumonia, multifocal. Stable oxygenation is 97% on 2 L./min per [place]. Very weak and deconditioned, pending transfer to SNF.  Last chest film from 04/30 with large opacity at the right upper lobe, CT chest cavitary infiltrate right upper lobe with significant bullae disease.   Plan to continue Augmentin for a total of 4 weeks, to complete May 29, 2020.  On prednisone taper.  Discontinue telemetry and decrease level of care to med surg  Consult nutrition.  2.  COPD exacerbation. On bronchodilator therapy, and supplemental 02 per [place], to keep oxygenation greater than 88%.   3.  SIADH, Hyponatremia, hypomagnesemia, hypokalemia Na today is 132, K 3,7 and serum bicarbonate 25 with Mg 1,4. Patient tolerating po well, with no nausea or vomiting. Positive post-pandrial diarrhea.   4.  Staph hominis bacteremia. Clinically resolved.   5.  Resolved small bowel obstruction. Now patient with post -pandrial diarrhea. Continue with probiotics and loperamide, will add pancreatic enzymes.   6.  Left upper lobe lung cancer. Follow up as  Outpatient   7. HTN. Blood pressure control with amlodipine and Irbesartan.  8. Swallow dysfunction. Continue with dysphagia 3 diet and aspiration precautions.   Status is: Inpatient  Remains inpatient appropriate because:pending transfer to SNF   Dispo:  Patient From: Home  Planned Disposition: [place]  Medically stable for discharge: yes     DVT prophylaxis: Enoxaparin   Code Status:   full  Family Communication:  No family at the bedside  Consultants:   ID    Antimicrobials:   Augmentin      Subjective: Patient no nausea or vomiting, but continue to have post pandrial diarrhea, no chest pain, dyspnea stable, continue to be very weak and deconditioned,.   Objective: Vitals:   05/05/20 2222 05/06/20 0012 05/06/20 0521 05/06/20 0819  BP: 113/75 125/80 137/81   Pulse: 99 78 72   Resp:   19   Temp: 97.7 F (36.5 C) 97.7 F (36.5 C) 97.8 F (36.6 C)   TempSrc: Oral Oral Oral   SpO2: 100% 99% 99% 97%  Weight:      Height:        Intake/Output Summary (Last 24 hours) at 05/06/2020 1037 Last data filed at 05/06/2020 0618 Gross per 24 hour  Intake 600 ml  Output 1300 ml  Net -700 ml   Filed Weights   04/10/20 1710 04/10/20 2120 05/01/20 1257  Weight: 76.2 kg 76.2 kg 69.3 kg    Examination:   General: Not in pain or dyspnea, deconditioned  Neurology: Awake and alert, non focal  E ENT: mild pallor, no icterus, oral mucosa moist Cardiovascular: No JVD. S1-S2 present, rhythmic, no gallops, rubs, or murmurs. No lower extremity edema. Pulmonary: positive breath sounds bilaterally, no wheezing or rhonchi Gastrointestinal. Abdomen mild distended but not tender, no rebound or guarding Skin. No rashes Musculoskeletal: no joint deformities     Data Reviewed: I have personally reviewed following labs and imaging studies  CBC: Recent Labs  Lab 04/30/20 0351 05/01/20 1238 05/02/20 0221  WBC 11.4* 13.3* 9.9  NEUTROABS 9.7*  --   --   HGB 9.4* 9.9* 9.6*  HCT 27.6* 29.9* 28.5*  MCV 85.7 87.9 86.4  PLT 377 397 956   Basic Metabolic Panel: Recent Labs  Lab 04/30/20 0351 05/01/20 1238 05/01/20 1719 05/02/20 0221 05/03/20 0240 05/06/20 0125  NA 130* 131*  --  137 132* 132*  K 3.7 3.3*  --  4.5 3.4* 3.7  CL 99 98  --  103 100 97*  CO2 23 23  --  25 25 25   GLUCOSE 102* 149*  --  151* 112* 118*  BUN 15 17  --  16 19 12   CREATININE 0.43* 0.53*  --  0.53* 0.45* 0.37*  CALCIUM 9.0 8.6*  --  9.0 8.8* 8.5*  MG  --   --  1.5*  --   --  1.4*   GFR: Estimated  Creatinine Clearance: 89 mL/min (A) (by C-G formula based on SCr of 0.37 mg/dL (L)). Liver Function Tests: No results for input(s): AST, ALT, ALKPHOS, BILITOT, PROT, ALBUMIN in the last 168 hours. No results for input(s): LIPASE, AMYLASE in the last 168 hours. No results for input(s): AMMONIA in the last 168 hours. Coagulation Profile: Recent Labs  Lab 05/01/20 1238  INR 1.2   Cardiac Enzymes: No results for input(s): CKTOTAL, CKMB, CKMBINDEX, TROPONINI in the last 168 hours. BNP (last 3 results) No results for input(s): PROBNP in the last 8760 hours. HbA1C: No results for input(s): HGBA1C in the last 72 hours. CBG: No results for input(s): GLUCAP in the last 168 hours. Lipid Profile: No results for input(s): CHOL, HDL, LDLCALC, TRIG, CHOLHDL, LDLDIRECT in the last 72 hours. Thyroid Function Tests: No results for input(s): TSH, T4TOTAL, FREET4, T3FREE, THYROIDAB in the last 72 hours. Anemia Panel: No results for input(s): VITAMINB12, FOLATE, FERRITIN, TIBC, IRON, RETICCTPCT in the last 72 hours.    Radiology Studies: I have reviewed all of the  imaging during this hospital visit personally     Scheduled Meds: . amLODipine  10 mg Oral Daily  . amoxicillin-clavulanate  1 tablet Oral Q12H  . aspirin EC  81 mg Oral Daily  . chlorhexidine  15 mL Mouth Rinse BID  . enoxaparin (LOVENOX) injection  40 mg Subcutaneous Q24H  . guaiFENesin  600 mg Oral BID  . ipratropium-albuterol  3 mL Nebulization TID  . irbesartan  300 mg Oral Daily  . lactobacillus  1 g Oral TID WC  . mouth rinse  15 mL Mouth Rinse q12n4p  . polyethylene glycol  17 g Oral BID  . predniSONE  30 mg Oral Q breakfast   Followed by  . [START ON 05/08/2020] predniSONE  20 mg Oral Q breakfast   Followed by  . [START ON 05/10/2020] predniSONE  10 mg Oral Q breakfast  . senna  1 tablet Oral QHS   Continuous Infusions:   LOS: 24 days        Tylin Force Gerome Apley, MD

## 2020-05-07 DIAGNOSIS — E43 Unspecified severe protein-calorie malnutrition: Secondary | ICD-10-CM | POA: Insufficient documentation

## 2020-05-07 LAB — HISTOPLASMA ANTIGEN, URINE: Histoplasma Antigen, urine: <0.5 (ref <0.5)

## 2020-05-07 MED ORDER — BOOST / RESOURCE BREEZE PO LIQD CUSTOM
1.0000 | ORAL | Status: DC
  Administered 2020-05-08 – 2020-05-10 (×3): 1 via ORAL

## 2020-05-07 MED ORDER — ADULT MULTIVITAMIN W/MINERALS CH
1.0000 | ORAL_TABLET | Freq: Every day | ORAL | Status: DC
  Administered 2020-05-07 – 2020-05-10 (×4): 1 via ORAL
  Filled 2020-05-07 (×4): qty 1

## 2020-05-07 MED ORDER — ENSURE ENLIVE PO LIQD
237.0000 mL | ORAL | Status: DC
  Administered 2020-05-07 – 2020-05-09 (×3): 237 mL via ORAL

## 2020-05-07 NOTE — Progress Notes (Signed)
Occupational Therapy Treatment Patient Details Name: Terry Page. MRN: [id number] DOB: 03/28/1955 Today's Date: 05/07/2020    History of present illness 65 yo male presents with complaints of shortness of breath. Pt tested positive for COVID 04/10/20.   on 4/15 pt developed nausea and vomiting and abdominal distention.  A CT scan was ordered.  This shows a small bowel obstruction with transition point in the distal small bowel  [facility] with both emphysema, spontaneous pneumothorax (2019 and 2021), SIADH, HTN, hyperlipidemia, prostate cancer and left lung cancer (currently receiving radiation therapy).   OT comments  Pt progressing slowly towards acute OT goals. Pt with DOE 3/4 with minimal OOB activity but motivated today to get to the recliner and sit up for a bit. At end of session O2 sat 96, HR 104. On 2L O2 [place] throughout session. D/c plan remains appropriate.    Follow Up Recommendations  SNF    Equipment Recommendations  Other (comment) (TBD next venue)    Recommendations for Other Services      Precautions / Restrictions Precautions Precautions: Fall Precaution Comments: Monitor O2 and HR Restrictions Weight Bearing Restrictions: No       Mobility Bed Mobility Overal bed mobility: Needs Assistance Bed Mobility: Rolling;Sidelying to Sit Rolling: Supervision Sidelying to sit: Supervision       General bed mobility comments: supervision for safety. extra time and effort. Used rails.    Transfers Overall transfer level: Needs assistance Equipment used: Rolling walker (2 wheeled) Transfers: Sit to/from [facility] Sit to Stand: Min guard Stand pivot transfers: Min guard       General transfer comment: SPT EOB to recliner. min guard for safety. Extra time and effort. DOE 3/4.    Balance Overall balance assessment: Needs assistance Sitting-balance support: No upper extremity supported;Feet supported Sitting balance-Leahy Scale: Good      Standing balance support: Bilateral upper extremity supported Standing balance-Leahy Scale: Poor Standing balance comment: external support needed                           ADL either performed or assessed with clinical judgement   ADL                           Toilet Transfer: Min guard;Stand-pivot;BSC;RW Toilet Transfer Details (indicate cue type and reason): DOE and feeling funny in standing limiting mobility. SPT to recliner. Pt able to static stand for about a minute, declined walking in place. Continued feeling poorly and SOB. Elevated BLE, pt unable to tolerate lowering head of recliner. DOE 3/4, On 2L O2[place]. At end of session O2 96, HR 106.           General ADL Comments: Pt completed rolling to each side, total A for pericare (pt preference). Pt then took pivotal steps to access recliner. DOE 3/4 limiting session     Vision       Perception     Praxis      Cognition Arousal/Alertness: Awake/alert Behavior During Therapy: Flat affect Overall Cognitive Status: Within Functional Limits for tasks assessed                                 General Comments: Pt is particular re: how things are done and does not like to be pushed.  Therapy has to be rather patient led.  Exercises     Shoulder Instructions       General Comments 2L O2 [place] throughout session. DOE 3/4 with minimal OOB activity. O2 sat 96 at end of session.    Pertinent Vitals/ Pain       Pain Assessment: Faces Faces Pain Scale: Hurts little more Pain Location: low back and abdomen Pain Descriptors / Indicators: Discomfort;Grimacing Pain Intervention(s): Monitored during session;Limited activity within patient's tolerance;Repositioned  Home Living                                          Prior Functioning/Environment              Frequency  Min 2X/week        Progress Toward Goals  OT Goals(current goals can now be found in  the care plan section)  Progress towards OT goals: Progressing toward goals  Acute Rehab OT Goals Patient Stated Goal: Not to overdo it. OT Goal Formulation: With patient Time For Goal Achievement: 05/17/20 Potential to Achieve Goals: Fair ADL Goals Pt Will Perform Upper Body Bathing: with supervision;with adaptive equipment;sitting Pt Will Perform Lower Body Bathing: with adaptive equipment;with modified independence;sit to/from stand;sitting/lateral leans Pt Will Perform Lower Body Dressing: with adaptive equipment;sitting/lateral leans;sit to/from stand;with modified independence Additional ADL Goal #1: Patient will identify at least 3 energy conservation strategies to emply at home in order to maximize function and quality of life and decrease caregiver burden while preventing exacerbation of symptoms and rehospitalization. Additional ADL Goal #2: Patient will stand at sink and perform 3/3 grooming tasks mod I with an RPE no greater than 3/10 in order to demonstrate standing balance and activity tolerance needed to return home with increased safety.  Plan Discharge plan remains appropriate    Co-evaluation                 AM-PAC OT "6 Clicks" Daily Activity     Outcome Measure   Help from another person eating meals?: None Help from another person taking care of personal grooming?: A Little Help from another person toileting, which includes using toliet, bedpan, or urinal?: A Lot Help from another person bathing (including washing, rinsing, drying)?: A Lot Help from another person to put on and taking off regular upper body clothing?: A Little Help from another person to put on and taking off regular lower body clothing?: A Lot 6 Click Score: 16    End of Session Equipment Utilized During Treatment: Rolling walker;Oxygen (2L)  OT Visit Diagnosis: Unsteadiness on feet (R26.81);Muscle weakness (generalized) (M62.81)   Activity Tolerance Patient limited by fatigue;Other  (comment);Patient tolerated treatment well (DOE 3/4. Motivated to get to recliner.)   Patient Left with call bell/phone within reach;with nursing/sitter in room;in chair   Nurse Communication Mobility status;Other (comment) (DOE 3/4 with minimal OOB activity)        Time: 2800-3491 OT Time Calculation (min): 22 min  Charges: OT General Charges $OT Visit: 1 Visit OT Treatments $Self Care/Home Management : 8-22 mins  Tyrone Schimke, OT Acute Rehabilitation Services Pager: [phone] Office: [phone]    Hortencia Pilar 05/07/2020, 11:25 AM

## 2020-05-07 NOTE — Progress Notes (Signed)
PROGRESS NOTE    Terry Page.  QBV:[id number] DOB: March 07, 1955 DOA: 04/10/2020 PCP: [name], MD    Brief Narrative:  Terry Page was admitted to the hospitalwith theworking diagnosis of acute hypoxic respiratory failure secondary to SARS COVID-19 viral pneumonia.  Hospitalization complicated by COPD exacerbation, healthcare associated pneumonia, necrotizing pneumonia and small bowel obstruction.  65 year old male past medical history for COPD,spontaneous pneumothorax 2019-2021, hypertension,SIADH, dyslipidemia, prostate cancer, left lung cancer who presented with worsening dyspnea. Patient reported rapid onset of progressive dyspnea, severe in intensity, associated with nonproductive cough and generalized weakness. On his initial evaluation patient was found in respiratory distress.Blood pressure 145/96, heart rate 107, respiratory rate 25, temp 97.9, oxygen saturation 96%, his lungs had scattered rhonchi bilaterally, positive rales, and expiratory wheezing. Prolonged expiratory phase. Significant increased work of breathing, heart S1-S2 present, tachycardic, abdomen soft nontender, no lower extremity edema.  Patient tested positive for COVID-19, his chest radiograph had a multifocal infiltrates. CT chest negative for pulmonary embolism. He was severely hyponatremic with sodium 119, and severe leukocytosis 36.3.  Patient had a prolonged hospitalization,complicated with small bowel obstruction, COPD exacerbation and healthcare associated pneumonia. He did require noninvasive mechanical ventilation in the stepdown unit.  Slowly he has been improving, continue to be very weak and deconditioned, pending transfer to SNF to continue therapy   Assessment & Plan:   Principal Problem:   Acute respiratory failure with hypoxia ([name]) Active Problems:   COPD with acute exacerbation ([place])   Pneumonia due to COVID-19 virus   Primary cancer of left upper lobe of lung ([name])    Cavitary pneumonia   SIADH (syndrome of inappropriate ADH production) ([name])   Essential hypertension   Mixed hyperlipidemia   Sepsis ([name])   Bullous emphysema ([name])   Necrotizing pneumonia ([name])   Abdominal pain   Protein-calorie malnutrition, severe   1.Acute hypoxic respiratory failure, SARS COVID-19 viral pneumonia, complicated with healthcare associated pneumonia,multifocal. Last chest film from 04/30 with large opacity at the right upper lobe, CT chest cavitary infiltrate right upper lobe with significant bullae disease.   Tolerating well Augmentin, plan to complete a total of 4 weeks, May 29, 2020.  Continue with prednisone taper.  Continue nutritional supplementation.   2.COPD exacerbation.Continue with bronchodilator therapy, and supplemental 02 per [place], to keep oxygenation greater than 88%.  3.SIADH, Hyponatremia, hypomagnesemia, hypokalemia. His diarrhea has been improving, will continue supportive care with antiacids, pancreatic enzymes and pro-biotics    4.Staph hominis bacteremia. Clinically resolved.   5.Resolved small bowel obstruction.Tolerating well probiotics and loperamide, will add pancreatic enzymes.  Diarrhea has been improving  6.Left upper lobe lungcancer.Outpatient follow up  7. HTN. On amlodipine and Irbesartan for blood pressure control.   8. Swallow dysfunction/ severe protein calorie malnutrition. On dysphagia 3 diet and aspiration precautions.  Continue with nutritional supplements.   Status is: Inpatient  Remains inpatient appropriate because:pending transfer to SNF   Dispo:  Patient From: Home  Planned Disposition: [place]  Medically stable for discharge: No         DVT prophylaxis: Enoxaparin   Code Status:   full  Family Communication:  No family at the bedside      Nutrition Status: Nutrition Problem: Severe Malnutrition Etiology: acute illness,catabolic illness (KCMKL-49  infection) Signs/Symptoms: moderate fat depletion,moderate muscle depletion,percent weight loss Percent weight loss: 9 % Interventions: Boost Breeze,Ensure Enlive (each supplement provides 350kcal and 20 grams of protein),Magic cup,MVI     Consultants:   ID  Pulmonary  Subjective: Patient with improvement of diarrhea, no nausea or vomiting, positive abdominal bloating, no chest pain and dyspnea continue to improve. Patient very weak and deconditioned.   Objective: Vitals:   05/07/20 0749 05/07/20 0948 05/07/20 1317 05/07/20 1347  BP:  120/88  101/78  Pulse:    (!) 127  Resp:    18  Temp:    97.9 F (36.6 C)  TempSrc:    Oral  SpO2: 98%  97% 99%  Weight:      Height:        Intake/Output Summary (Last 24 hours) at 05/07/2020 1436 Last data filed at 05/07/2020 0534 Gross per 24 hour  Intake 50 ml  Output 450 ml  Net -400 ml   Filed Weights   04/10/20 1710 04/10/20 2120 05/01/20 1257  Weight: 76.2 kg 76.2 kg 69.3 kg    Examination:   General: Not in pain or dyspnea, deconditioned  Neurology: Awake and alert, non focal  E ENT: no pallor, no icterus, oral mucosa moist Cardiovascular: No JVD. S1-S2 present, rhythmic, no gallops, rubs, or murmurs. No lower extremity edema. Pulmonary: positive breath sounds bilaterally, adequate air movement, no wheezing, rhonchi or rales. Gastrointestinal. Abdomen soft and non tender, positive mild distention and tympanic Skin. No rashes Musculoskeletal: no joint deformities     Data Reviewed: I have personally reviewed following labs and imaging studies  CBC: Recent Labs  Lab 05/01/20 1238 05/02/20 0221  WBC 13.3* 9.9  HGB 9.9* 9.6*  HCT 29.9* 28.5*  MCV 87.9 86.4  PLT 397 941   Basic Metabolic Panel: Recent Labs  Lab 05/01/20 1238 05/01/20 1719 05/02/20 0221 05/03/20 0240 05/06/20 0125  NA 131*  --  137 132* 132*  K 3.3*  --  4.5 3.4* 3.7  CL 98  --  103 100 97*  CO2 23  --  25 25 25   GLUCOSE 149*  --  151*  112* 118*  BUN 17  --  16 19 12   CREATININE 0.53*  --  0.53* 0.45* 0.37*  CALCIUM 8.6*  --  9.0 8.8* 8.5*  MG  --  1.5*  --   --  1.4*   GFR: Estimated Creatinine Clearance: 89 mL/min (A) (by C-G formula based on SCr of 0.37 mg/dL (L)). Liver Function Tests: No results for input(s): AST, ALT, ALKPHOS, BILITOT, PROT, ALBUMIN in the last 168 hours. No results for input(s): LIPASE, AMYLASE in the last 168 hours. No results for input(s): AMMONIA in the last 168 hours. Coagulation Profile: Recent Labs  Lab 05/01/20 1238  INR 1.2   Cardiac Enzymes: No results for input(s): CKTOTAL, CKMB, CKMBINDEX, TROPONINI in the last 168 hours. BNP (last 3 results) No results for input(s): PROBNP in the last 8760 hours. HbA1C: No results for input(s): HGBA1C in the last 72 hours. CBG: No results for input(s): GLUCAP in the last 168 hours. Lipid Profile: No results for input(s): CHOL, HDL, LDLCALC, TRIG, CHOLHDL, LDLDIRECT in the last 72 hours. Thyroid Function Tests: No results for input(s): TSH, T4TOTAL, FREET4, T3FREE, THYROIDAB in the last 72 hours. Anemia Panel: No results for input(s): VITAMINB12, FOLATE, FERRITIN, TIBC, IRON, RETICCTPCT in the last 72 hours.    Radiology Studies: I have reviewed all of the imaging during this hospital visit personally     Scheduled Meds: . amLODipine  10 mg Oral Daily  . amoxicillin-clavulanate  1 tablet Oral Q12H  . aspirin EC  81 mg Oral Daily  . chlorhexidine  15 mL Mouth Rinse BID  .  enoxaparin (LOVENOX) injection  40 mg Subcutaneous Q24H  . [START ON 05/08/2020] feeding supplement  1 Container Oral Q24H  . feeding supplement  237 mL Oral Q24H  . guaiFENesin  600 mg Oral BID  . ipratropium-albuterol  3 mL Nebulization TID  . irbesartan  300 mg Oral Daily  . lactobacillus  1 g Oral TID WC  . lipase/protease/amylase  12,000 Units Oral TID AC  . mouth rinse  15 mL Mouth Rinse q12n4p  . multivitamin with minerals  1 tablet Oral Daily  .  polyethylene glycol  17 g Oral BID  . [START ON 05/08/2020] predniSONE  20 mg Oral Q breakfast   Followed by  . [START ON 05/10/2020] predniSONE  10 mg Oral Q breakfast  . senna  1 tablet Oral QHS   Continuous Infusions:   LOS: 25 days        Teshaun Olarte Gerome Apley, MD

## 2020-05-07 NOTE — Progress Notes (Signed)
Patient have been refusing laxative, abd still distended

## 2020-05-07 NOTE — Progress Notes (Addendum)
  Speech Language Pathology Treatment: Dysphagia  Patient Details Name: Terry Page. MRN: [id number] DOB: January 02, 1956 Today's Date: 05/07/2020 Time: 6834-1962 SLP Time Calculation (min) (ACUTE ONLY): 14 min  Assessment / Plan / Recommendation Clinical Impression  Pt today seen to assure tolerating po and for education. He denies dysphagia nor having reflux but admits to occasional belching during intake.  Abdomen remains distended and painful to pt but he reports having two bowel movements this morning.  Pt states he is eating what he can and desires current diet to remain.  His dentition appeared dirty - though he stated he has been brushing his teeth.  Located toothbrush/paste in paper bag on his bench seat - after which he advised he has not brushed his teeth for 2 days but states he has swished with water.  Educated him to need to use brush to remove biofilm - which he completed while SLP obtained emesis basin for him.  Oral care/dental brushing critical especially as pt has pna.  Pt politely declined to consume po stating he "ate what he could".  He subjectively reports voice is level 6/10 = when 2 days ago he provided 5/10 with *10 being normal.  Admits to ongoing cough but not coorelated to po intake and RN denies pt coughing with intake of medications.  Posterior oral cavity appeared with whitish area near transition from hard palate to soft palate on right - with bilateral erythema - He denies discomfort in this area.  SLP took picture on pt's cell phone for him to use as reference.   No follow up indicated as pt educated to precautions/mitigation strategies.    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 Course complicated by SBO, improved with transient NG tube decompression.  Developed right upper lobe air-fluid levels/necrotizing pneumonia.  Transferred to ICU 4/30 for respiratory distress requiring BiPAP. Received  consult given concern for potential aspiration event.  Pt seen for follow up regarding aspiration risk. Today he reports he is coughing with solids more than liquids toward end of meal.  He denies refluxing and states he eats all of his meal.      SLP Plan  All goals met       Recommendations  Diet recommendations: Dysphagia 3 (mechanical soft);Thin liquid Liquids provided via: Cup;Straw Medication Administration: Whole meds with liquid Supervision: Patient able to self feed;Intermittent supervision to cue for compensatory strategies Compensations: Slow rate;Small sips/bites Postural Changes and/or Swallow Maneuvers: Seated upright 90 degrees;Upright 30-60 min after meal                Oral Care Recommendations: Oral care BID Follow up Recommendations: Other (comment) (TBD) SLP Visit Diagnosis: Dysphagia, unspecified (R13.10) Plan: All goals met       GO                Macario Golds 05/07/2020, 5:09 PM  Kathleen Lime, MS [facility] SLP Acute Rehab Services Office [phone] Pager [phone]

## 2020-05-07 NOTE — Progress Notes (Signed)
Initial Nutrition Assessment  DOCUMENTATION CODES:   Severe malnutrition in context of acute illness/injury  INTERVENTION:  - will order Boost Breeze once/day, each supplement provides 250 kcal and 9 grams of protein. - will order Ensure Enlive once/day, each supplement provides 350 kcal and 20 grams of protein. - will order Magic Cup with lunch meals, each supplement provides 290 kcal and 9 grams of protein. - will order 1 tablet multivitamin with minerals/day.    NUTRITION DIAGNOSIS:   Severe Malnutrition related to acute illness,catabolic illness (BZJIR-67 infection) as evidenced by moderate fat depletion,moderate muscle depletion,percent weight loss.  GOAL:   Patient will meet greater than or equal to 90% of their needs  MONITOR:   PO intake,Supplement acceptance,Labs,Weight trends  REASON FOR ASSESSMENT:   Malnutrition Screening Tool  ASSESSMENT:   65 year old male medical history of COPD, spontaneous pneumothorax 2019-2021, HTN, SIADH, dyslipidemia, prostate cancer, and L lung cancer. He presented to the ED with worsening dyspnea, non-productive cough, and generalized weakness. In the ED he tested positive for COVID-19. He has had prolonged hospitalization d/t SBO, COPD exacerbation, HCAP, and recovery from COVID-19. He has been very weak and deconditioned and is pending transfer to SNF.  Diet advanced from NPO to Dysphagia 2, thin liquids on 4/30 at 1805, resumed NPO on 5/1 at 0856, re-advanced to Dysphagia 2, thin liquids on 5/1 at 1450 and to Dysphagia 3, thin liquids no 5/2 at 0930.  He has mainly been consuming 100% of small meals over the past week. Not meeting needs.   Patient sitting in the chair with no family/visitors present. Patient reports being very hungry and that he has not received meal tray: ordered lunch per preference (spaghetti with meat sauce, cookie, orange sherbet, and sprite). He reports eating 100% of breakfast this AM (meal provided 245 kcal and  25 grams protein).   He denies any abdominal discomfort with PO intakes and denies any chewing or swallowing difficulty with any items over the past 1 week.   Weight on 4/30 was 153 lb, weight on 4/9 was 168 lb, and weight on 2/4 was 167 lb. Patient reports weight loss has occurred while hospitalized and that prior to hospitalization his weight had been stable for several years.   This indicates 15 lb weight loss (9% body weight) in <1 month; significant for time frame.    Labs reviewed; Na: 132 mmol/l, Cl: 97 mmol/l, creatinine: 0.37 mg/dl, Ca: 8.5 mg/dl, Mg: 1.4 mg/dl. Medications reviewed; 12000 units creon TID, 2 g IV Mg sulfate x1 run 5/5, 17 g miralax BID, deltasone taper, 1 tablet senokot/day.     NUTRITION - FOCUSED PHYSICAL EXAM:  Flowsheet Row Most Recent Value  Orbital Region Mild depletion  Upper Arm Region Mild depletion  Thoracic and Lumbar Region Unable to assess  Buccal Region Moderate depletion  Temple Region Mild depletion  Clavicle Bone Region Moderate depletion  Clavicle and Acromion Bone Region Moderate depletion  Scapular Bone Region Moderate depletion  Dorsal Hand Mild depletion  Patellar Region Moderate depletion  Anterior Thigh Region Moderate depletion  Posterior Calf Region Moderate depletion  Edema (RD Assessment) None  Hair Reviewed  Eyes Reviewed  Mouth Reviewed  Skin Reviewed  Nails Reviewed       Diet Order:   Diet Order            DIET DYS 3 Room service appropriate? Yes with Assist; Fluid consistency: Thin  Diet effective now  EDUCATION NEEDS:   No education needs have been identified at this time  Skin:  Skin Assessment: Reviewed RN Assessment  Last BM:  5/6 (type 6 x1)  Height:   Ht Readings from Last 1 Encounters:  05/01/20 5\' 9"  (1.753 m)    Weight:   Wt Readings from Last 1 Encounters:  05/01/20 69.3 kg    Estimated Nutritional Needs:  Kcal:  2100-2300 kcal Protein:  105-120 grams Fluid:  >/=  2 L/day     Jarome Matin, MS, RD, LDN, CNSC Inpatient Clinical Dietitian RD pager # available in [facility]  After hours/weekend pager # available in [facility]

## 2020-05-08 NOTE — Progress Notes (Signed)
PROGRESS NOTE    Terry Page.  OZH:[id number] DOB: 08-Sep-1955 DOA: 04/10/2020 PCP: [name], MD    Brief Narrative:  Terry Page was admitted to the hospitalwith theworking diagnosis of acute hypoxic respiratory failure secondary to SARS COVID-19 viral pneumonia.  Hospitalization complicated by COPD exacerbation, healthcare associated pneumonia, necrotizing pneumonia and small bowel obstruction.  65 year old male past medical history for COPD,spontaneous pneumothorax 2019-2021, hypertension,SIADH, dyslipidemia, prostate cancer, left lung cancer who presented with worsening dyspnea. Patient reported rapid onset of progressive dyspnea, severe in intensity, associated with nonproductive cough and generalized weakness. On his initial evaluation patient was found in respiratory distress.Blood pressure 145/96, heart rate 107, respiratoryrate25, temp 97.9, oxygen saturation 96%, his lungs had scattered rhonchi bilaterally, positive rales, and expiratory wheezing. Prolonged expiratory phase. Significant increased work of breathing, heart S1-S2present, tachycardic, abdomen soft nontender, no lower extremity edema.  Patient tested positive for COVID-19, his chest radiograph had a multifocal infiltrates. CT chest negative for pulmonary embolism. He was severely hyponatremic with sodium 119, and severe leukocytosis 36.3.  Patient had a prolonged hospitalization,complicated with small bowel obstruction, COPD exacerbation and healthcare associated pneumonia. He did require noninvasive mechanical ventilation in the stepdown unit.  Slowly he has been improving, continue to be very weak and deconditioned,pending transfer to SNF to continue therapy   Assessment & Plan:   Principal Problem:   Acute respiratory failure with hypoxia ([name]) Active Problems:   COPD with acute exacerbation ([employer])   Pneumonia due to COVID-19 virus   Primary cancer of left upper lobe of lung ([name])    Cavitary pneumonia   SIADH (syndrome of inappropriate ADH production) ([name])   Essential hypertension   Mixed hyperlipidemia   Sepsis ([name])   Bullous emphysema ([name])   Necrotizing pneumonia ([name])   Abdominal pain   Protein-calorie malnutrition, severe    1.Acute hypoxic respiratory failure, SARS COVID-19 viral pneumonia, complicated with healthcare associated pneumonia,multifocal. Last chest film from 04/30 with large opacity at the right upper lobe, CT chest cavitary infiltrate right upper lobe with significant bullae disease.  Continue withAugmentin, to complete on May 29, 2020.  Steroid taper.   Nutritional supplementation.   2.COPD exacerbation.bronchodilator therapy,andsupplemental 02 per [place], to keep oxygenation greater than 88%. Clinically resolved exacerbation   3.SIADH, Hyponatremia, hypomagnesemia, hypokalemia. Continue with antiacids, pancreatic enzymes and pro-biotics Diarrhea resolved,  Patient asking for double portions.   4.Staph hominis bacteremia.Clinically resolved.  5.Resolved small bowel obstruction.Continue with probiotics and loperamide,  pancreatic enzymes. Diarrhea has resolved, Patient now asking for double portions meals.   6.Left upper lobe lungcancer.Outpatient follow up  7. HTN.Continue with amlodipine andIrbesartan for blood pressure control.   8. Swallow dysfunction/ severe protein calorie malnutrition. contineu with dysphagia 3 diet and aspiration precautions.    Status is: Inpatient  Remains inpatient appropriate because:Unsafe d/c plan Pending SNF placement,    Dispo:  Patient From: Home  Planned Disposition: [place]  Medically stable for discharge: No      DVT prophylaxis: Enoxaparin   Code Status:   full  Family Communication:  No family at the bedside      Nutrition Status: Nutrition Problem: Severe Malnutrition Etiology: acute illness,catabolic illness (GEXBM-84  infection) Signs/Symptoms: moderate fat depletion,moderate muscle depletion,percent weight loss Percent weight loss: 9 % Interventions: Boost Breeze,Ensure Enlive (each supplement provides 350kcal and 20 grams of protein),Magic cup,MVI     Skin Documentation:     Consultants:   ID   Pulmonary     Subjective: Patient reports improvement  in his diarrhea, no nausea or vomiting, no chest pain. Improve appetite, asking for double portions.   Objective: Vitals:   05/07/20 2234 05/08/20 0542 05/08/20 0731 05/08/20 0900  BP: 126/82 133/79  125/73  Pulse: 88 78    Resp: 20 18    Temp: 97.9 F (36.6 C) 98.1 F (36.7 C)    TempSrc: Oral Oral    SpO2: 100% 98% 98%   Weight:      Height:        Intake/Output Summary (Last 24 hours) at 05/08/2020 1243 Last data filed at 05/08/2020 0538 Gross per 24 hour  Intake 480 ml  Output 1525 ml  Net -1045 ml   Filed Weights   04/10/20 1710 04/10/20 2120 05/01/20 1257  Weight: 76.2 kg 76.2 kg 69.3 kg    Examination:   General: Not in pain or dyspnea, deconditioned  Neurology: Awake and alert, non focal  E ENT: mild pallor, no icterus, oral mucosa moist Cardiovascular: No JVD. S1-S2 present, rhythmic, no gallops, rubs, or murmurs. No lower extremity edema. Pulmonary: positive breath sounds bilaterally, adequate air movement, no wheezing, rhonchi or rales. Gastrointestinal. Abdomen soft and non tender, mild distended Skin. No rashes Musculoskeletal: no joint deformities     Data Reviewed: I have personally reviewed following labs and imaging studies  CBC: Recent Labs  Lab 05/02/20 0221  WBC 9.9  HGB 9.6*  HCT 28.5*  MCV 86.4  PLT 643   Basic Metabolic Panel: Recent Labs  Lab 05/01/20 1719 05/02/20 0221 05/03/20 0240 05/06/20 0125  NA  --  137 132* 132*  K  --  4.5 3.4* 3.7  CL  --  103 100 97*  CO2  --  25 25 25   GLUCOSE  --  151* 112* 118*  BUN  --  16 19 12   CREATININE  --  0.53* 0.45* 0.37*  CALCIUM  --   9.0 8.8* 8.5*  MG 1.5*  --   --  1.4*   GFR: Estimated Creatinine Clearance: 89 mL/min (A) (by C-G formula based on SCr of 0.37 mg/dL (L)). Liver Function Tests: No results for input(s): AST, ALT, ALKPHOS, BILITOT, PROT, ALBUMIN in the last 168 hours. No results for input(s): LIPASE, AMYLASE in the last 168 hours. No results for input(s): AMMONIA in the last 168 hours. Coagulation Profile: No results for input(s): INR, PROTIME in the last 168 hours. Cardiac Enzymes: No results for input(s): CKTOTAL, CKMB, CKMBINDEX, TROPONINI in the last 168 hours. BNP (last 3 results) No results for input(s): PROBNP in the last 8760 hours. HbA1C: No results for input(s): HGBA1C in the last 72 hours. CBG: No results for input(s): GLUCAP in the last 168 hours. Lipid Profile: No results for input(s): CHOL, HDL, LDLCALC, TRIG, CHOLHDL, LDLDIRECT in the last 72 hours. Thyroid Function Tests: No results for input(s): TSH, T4TOTAL, FREET4, T3FREE, THYROIDAB in the last 72 hours. Anemia Panel: No results for input(s): VITAMINB12, FOLATE, FERRITIN, TIBC, IRON, RETICCTPCT in the last 72 hours.    Radiology Studies: I have reviewed all of the imaging during this hospital visit personally     Scheduled Meds: . amLODipine  10 mg Oral Daily  . amoxicillin-clavulanate  1 tablet Oral Q12H  . aspirin EC  81 mg Oral Daily  . chlorhexidine  15 mL Mouth Rinse BID  . enoxaparin (LOVENOX) injection  40 mg Subcutaneous Q24H  . feeding supplement  1 Container Oral Q24H  . feeding supplement  237 mL Oral Q24H  . guaiFENesin  600 mg Oral BID  . ipratropium-albuterol  3 mL Nebulization TID  . irbesartan  300 mg Oral Daily  . lactobacillus  1 g Oral TID WC  . lipase/protease/amylase  12,000 Units Oral TID AC  . mouth rinse  15 mL Mouth Rinse q12n4p  . multivitamin with minerals  1 tablet Oral Daily  . polyethylene glycol  17 g Oral BID  . [START ON 05/10/2020] predniSONE  10 mg Oral Q breakfast  . senna  1  tablet Oral QHS   Continuous Infusions:   LOS: 26 days        [facility] Gerome Apley, MD

## 2020-05-08 NOTE — Plan of Care (Signed)
  Problem: Clinical Measurements: Goal: Cardiovascular complication will be avoided Outcome: Progressing   Problem: Activity: Goal: Risk for activity intolerance will decrease Outcome: Progressing   Problem: Nutrition: Goal: Adequate nutrition will be maintained Outcome: Progressing   Problem: Pain Managment: Goal: General experience of comfort will improve Outcome: Progressing   Problem: Respiratory: Goal: Complications related to the disease process, condition or treatment will be avoided or minimized Outcome: Progressing

## 2020-05-09 MED ORDER — PREDNISONE 20 MG PO TABS
20.0000 mg | ORAL_TABLET | Freq: Once | ORAL | Status: AC
  Administered 2020-05-09: 20 mg via ORAL
  Filled 2020-05-09: qty 1

## 2020-05-09 NOTE — Progress Notes (Signed)
PROGRESS NOTE    [name].  PTW:[id number] DOB: 1955-12-15 DOA: 04/10/2020 PCP: [name], MD    Brief Narrative:  Mr. Thebeau was admitted to the hospitalwith theworking diagnosis of acute hypoxic respiratory failure secondary to SARS COVID-19 viral pneumonia.  Hospitalization complicated by COPD exacerbation, healthcare associated pneumonia, necrotizing pneumonia and small bowel obstruction.  65 year old male past medical history for COPD,spontaneous pneumothorax 2019-2021, hypertension,SIADH, dyslipidemia, prostate cancer, left lung cancer who presented with worsening dyspnea. Patient reported rapid onset of progressive dyspnea, severe in intensity, associated with nonproductive cough and generalized weakness. On his initial evaluation patient was found in respiratory distress.Blood pressure 145/96, heart rate 107, respiratoryrate25, temp 97.9, oxygen saturation 96%, his lungs had scattered rhonchi bilaterally, positive rales, and expiratory wheezing. Prolonged expiratory phase. Significant increased work of breathing, heart S1-S2present, tachycardic, abdomen soft nontender, no lower extremity edema.  Patient tested positive for COVID-19, his chest radiograph had a multifocal infiltrates. CT chest negative for pulmonary embolism. He was severely hyponatremic with sodium 119, and severe leukocytosis 36.3.  Patient had a prolonged hospitalization,complicated with small bowel obstruction, COPD exacerbation and healthcare associated pneumonia. He did require noninvasive mechanical ventilation in the stepdown unit.  Slowly he has been improving, continue to be very weak and deconditioned,pending transfer to SNF to continue therapy    Assessment & Plan:   Principal Problem:   Acute respiratory failure with hypoxia ([name]) Active Problems:   COPD with acute exacerbation ([place])   Pneumonia due to COVID-19 virus   Primary cancer of left upper lobe of lung ([name])    Cavitary pneumonia   SIADH (syndrome of inappropriate ADH production) ([name])   Essential hypertension   Mixed hyperlipidemia   Sepsis ([name])   Bullous emphysema ([name])   Necrotizing pneumonia ([name])   Abdominal pain   Protein-calorie malnutrition, severe   1.Acute hypoxic respiratory failure, SARS COVID-19 viral pneumonia, complicated with healthcare associated pneumonia,multifocal. Last chest film from 04/30 with large opacity at the right upper lobe, CT chest cavitary infiltrate right upper lobe with significant bullae disease.  Tolerating well Augmentin, to complete onMay 28, 2022. Continue with steroid taper.   Nutritional supplementation, appetite continue to increase.  2.COPD exacerbation.Onbronchodilator therapy,andsupplemental 02 per [place],  His oxygenation is 93 to 95% on room air.   3.SIADH, Hyponatremia, hypomagnesemia, hypokalemia. On antiacids, pancreatic enzymes and pro-biotics  4.Staph hominis bacteremia.Clinically resolved.  5.Resolved small bowel obstruction.On probiotics and loperamide,  pancreatic enzymes. Has persistent abdominal distention, no nausea or vomiting, clinically no signs of recurrent obstruction.  Continue to encourage mobility.   6.Left upper lobe lungcancer.Plan for outpatient follow up  7. HTN.Blood pressure control withamlodipine andIrbesartan.  8. Swallow dysfunction/ severe protein calorie malnutrition. Ondysphagia 3 diet and aspiration precautions.   Status is: Inpatient  Remains inpatient appropriate because:pending transfer to SNF   Dispo:  Patient From: Home  Planned Disposition: [place]  Medically stable for discharge: yes      DVT prophylaxis: Enoxaparin   Code Status:   full  Family Communication:  No family at the bedside      Nutrition Status: Nutrition Problem: Severe Malnutrition Etiology: acute illness,catabolic illness ([id number] infection) Signs/Symptoms:  moderate fat depletion,moderate muscle depletion,percent weight loss Percent weight loss: 9 % Interventions: Boost Breeze,Ensure Enlive (each supplement provides 350kcal and 20 grams of protein),Magic cup,MVI     Subjective: Patient is feeling better, but not yet back to baseline, continue to be very weak and deconditioned, positive abdominal distention, no nausea or vomiting, no  chest pain or dyspnea, diarrhea has resolved.   Objective: Vitals:   05/08/20 2154 05/09/20 0526 05/09/20 0805 05/09/20 1128  BP: 106/70 121/83  120/73  Pulse: 90 92    Resp: 20 20    Temp: 98.5 F (36.9 C) 98.8 F (37.1 C)    TempSrc: Oral Oral    SpO2: 96% 93% 95%   Weight:      Height:        Intake/Output Summary (Last 24 hours) at 05/09/2020 1252 Last data filed at 05/09/2020 0200 Gross per 24 hour  Intake 180 ml  Output 800 ml  Net -620 ml   Filed Weights   04/10/20 1710 04/10/20 2120 05/01/20 1257  Weight: 76.2 kg 76.2 kg 69.3 kg    Examination:   General: Not in pain or dyspnea. Deconditioned  Neurology: Awake and alert, non focal  E ENT: no pallor, no icterus, oral mucosa moist Cardiovascular: No JVD. S1-S2 present, rhythmic, no gallops, rubs, or murmurs. No lower extremity edema. Pulmonary: positive breath sounds bilaterally, adequate air movement, no wheezing, rhonchi or rales. Gastrointestinal. Abdomen distended but not tender Skin. No rashes Musculoskeletal: no joint deformities     Data Reviewed: I have personally reviewed following labs and imaging studies  CBC: No results for input(s): WBC, NEUTROABS, HGB, HCT, MCV, PLT in the last 168 hours. Basic Metabolic Panel: Recent Labs  Lab 05/03/20 0240 05/06/20 0125  NA 132* 132*  K 3.4* 3.7  CL 100 97*  CO2 25 25  GLUCOSE 112* 118*  BUN 19 12  CREATININE 0.45* 0.37*  CALCIUM 8.8* 8.5*  MG  --  1.4*   GFR: Estimated Creatinine Clearance: 89 mL/min (A) (by C-G formula based on SCr of 0.37 mg/dL (L)). Liver Function  Tests: No results for input(s): AST, ALT, ALKPHOS, BILITOT, PROT, ALBUMIN in the last 168 hours. No results for input(s): LIPASE, AMYLASE in the last 168 hours. No results for input(s): AMMONIA in the last 168 hours. Coagulation Profile: No results for input(s): INR, PROTIME in the last 168 hours. Cardiac Enzymes: No results for input(s): CKTOTAL, CKMB, CKMBINDEX, TROPONINI in the last 168 hours. BNP (last 3 results) No results for input(s): PROBNP in the last 8760 hours. HbA1C: No results for input(s): HGBA1C in the last 72 hours. CBG: No results for input(s): GLUCAP in the last 168 hours. Lipid Profile: No results for input(s): CHOL, HDL, LDLCALC, TRIG, CHOLHDL, LDLDIRECT in the last 72 hours. Thyroid Function Tests: No results for input(s): TSH, T4TOTAL, FREET4, T3FREE, THYROIDAB in the last 72 hours. Anemia Panel: No results for input(s): VITAMINB12, FOLATE, FERRITIN, TIBC, IRON, RETICCTPCT in the last 72 hours.    Radiology Studies: I have reviewed all of the imaging during this hospital visit personally     Scheduled Meds: . amLODipine  10 mg Oral Daily  . amoxicillin-clavulanate  1 tablet Oral Q12H  . aspirin EC  81 mg Oral Daily  . chlorhexidine  15 mL Mouth Rinse BID  . enoxaparin (LOVENOX) injection  40 mg Subcutaneous Q24H  . feeding supplement  1 Container Oral Q24H  . feeding supplement  237 mL Oral Q24H  . guaiFENesin  600 mg Oral BID  . ipratropium-albuterol  3 mL Nebulization TID  . irbesartan  300 mg Oral Daily  . lactobacillus  1 g Oral TID WC  . lipase/protease/amylase  12,000 Units Oral TID AC  . mouth rinse  15 mL Mouth Rinse q12n4p  . multivitamin with minerals  1 tablet Oral Daily  .  polyethylene glycol  17 g Oral BID  . [START ON 05/10/2020] predniSONE  10 mg Oral Q breakfast  . senna  1 tablet Oral QHS   Continuous Infusions:   LOS: 27 days        Kelyn Ponciano Gerome Apley, MD

## 2020-05-10 ENCOUNTER — Encounter: Payer: Self-pay | Admitting: Radiation Oncology

## 2020-05-10 MED ORDER — AMOXICILLIN-POT CLAVULANATE 875-125 MG PO TABS
1.0000 | ORAL_TABLET | Freq: Two times a day (BID) | ORAL | 0 refills | Status: DC
Start: 2020-05-10 — End: 2020-05-22

## 2020-05-10 MED ORDER — PANCRELIPASE (LIP-PROT-AMYL) 12000-38000 UNITS PO CPEP: 12000.0000 [IU] | ORAL_CAPSULE | Freq: Three times a day (TID) | ORAL | 0 refills | Status: AC

## 2020-05-10 MED ORDER — FLORANEX PO PACK: 1.0000 g | PACK | Freq: Three times a day (TID) | ORAL | 0 refills | Status: DC

## 2020-05-10 MED ORDER — ADULT MULTIVITAMIN W/MINERALS CH: 1.0000 | ORAL_TABLET | Freq: Every day | ORAL | 0 refills | Status: AC

## 2020-05-10 MED ORDER — ENSURE ENLIVE PO LIQD: 237.0000 mL | ORAL | 0 refills | Status: DC

## 2020-05-10 MED ORDER — ACETAMINOPHEN 325 MG PO TABS: 650.0000 mg | ORAL_TABLET | Freq: Four times a day (QID) | ORAL | Status: DC | PRN

## 2020-05-10 MED ORDER — ALUM & MAG HYDROXIDE-SIMETH 200-200-20 MG/5ML PO SUSP: 15.0000 mL | Freq: Four times a day (QID) | ORAL | 0 refills | Status: DC | PRN

## 2020-05-10 MED ORDER — DIPHENOXYLATE-ATROPINE 2.5-0.025 MG PO TABS: 1.0000 | ORAL_TABLET | Freq: Four times a day (QID) | ORAL | 0 refills | Status: DC | PRN

## 2020-05-10 MED ORDER — POLYETHYLENE GLYCOL 3350 17 G PO PACK: 17.0000 g | PACK | Freq: Two times a day (BID) | ORAL | 0 refills | Status: DC

## 2020-05-10 MED ORDER — IPRATROPIUM-ALBUTEROL 0.5-2.5 (3) MG/3ML IN SOLN: 3.0000 mL | Freq: Four times a day (QID) | RESPIRATORY_TRACT | 0 refills | Status: DC | PRN

## 2020-05-10 NOTE — TOC Transition Note (Addendum)
Transition of Care [facility]) - CM/SW Discharge Note   Patient Details  Name: Roshawn Ayala. MRN: [id number] Date of Birth: 1955-10-10  [facility]) CM/SW Contact:  Dessa Phi, RN Phone Number: 05/10/2020, 12:32 PM   Clinical Narrative: P2P approved per Dr. Vergia Alberts [facility] rep Maudie Mercury awaiting auth#. CM awaiting rm#,tel# for nsg to call report. PTAR to be called once all completed.   2:52p-[facility] rep Kim-P2P-approved. Going to rm#[id number],nsg call reprot tel#[phone]. PTAR called. No further CM needs.   Final next level of care: Skilled Nursing Facility Barriers to Discharge: No Barriers Identified   Patient Goals and CMS Choice Patient states their goals for this hospitalization and ongoing recovery are:: to go home CMS Medicare.gov Compare Post Acute Care list provided to:: Patient Choice offered to / list presented to : Patient  Discharge Placement PASRR number recieved: 05/07/20            Patient chooses bed at: Other - please specify in the comment section below: ([facility]) Patient to be transferred to facility by: [place] Name of family member notified: [name] spouse [phone] Patient and family notified of of transfer: 05/10/20  Discharge Plan and Services   Discharge Planning Services: CM Consult            DME Arranged: Oxygen DME Agency: [employer] Date DME Agency Contacted: 05/04/20 Time DME Agency Contacted: [phone]              Social Determinants of Health (SDOH) Interventions     Readmission Risk Interventions Readmission Risk Prevention Plan 10/24/2017  Post Dischage Appt Complete  Medication Screening Complete  Transportation Screening Complete  PCP follow-up Complete  Some recent data might be hidden

## 2020-05-10 NOTE — Progress Notes (Signed)
Physical Therapy Treatment Patient Details Name: Terry Page. MRN: [id number] DOB: 03/09/1955 Today's Date: 05/10/2020    History of Present Illness 65 yo male presents with complaints of shortness of breath. Pt tested positive for COVID 04/10/20.   on 4/15 pt developed nausea and vomiting and abdominal distention.  A CT scan was ordered.  This shows a small bowel obstruction with transition point in the distal small bowel  ZDG:LOVF with both emphysema, spontaneous pneumothorax (2019 and 2021), SIADH, HTN, hyperlipidemia, prostate cancer and left lung cancer (currently receiving radiation therapy).    PT Comments    Pt agreeable to strengthening exercises, mild SOB that improves with rest breaks. Pt ambulates limited distance in room, motivated to discharge and declines further ambulation due to wanting to save energy to leave. Pt tolerates remaining up in chair at EOS. Nursing notified pt has questions regarding discharge.   Follow Up Recommendations  SNF     Equipment Recommendations  Rolling walker with 5" wheels    Recommendations for Other Services       Precautions / Restrictions Precautions Precautions: Fall Restrictions Weight Bearing Restrictions: No    Mobility  Bed Mobility  General bed mobility comments: in chair upon arrival    Transfers Overall transfer level: Needs assistance Equipment used: Rolling walker (2 wheeled) Transfers: Sit to/from Stand Sit to Stand: Min guard  General transfer comment: VCs for hand placement, powers to stand with UE assisting, uncontrolled descent without hands assisting to return to sitting  Ambulation/Gait Ambulation/Gait assistance: Min guard Gait Distance (Feet): 10 Feet Assistive device: Rolling walker (2 wheeled)  Gait velocity: decreased   General Gait Details: pt ambulates short distance in room, no LOB, mild SOB requiring seated rest break to recover   Stairs             Wheelchair Mobility     Modified Rankin (Stroke Patients Only)       Balance Overall balance assessment: Needs assistance  Standing balance support: During functional activity;Bilateral upper extremity supported Standing balance-Leahy Scale: Poor Standing balance comment: reliant on UE support       Cognition Arousal/Alertness: Awake/alert Behavior During Therapy: WFL for tasks assessed/performed Overall Cognitive Status: Within Functional Limits for tasks assessed         Exercises General Exercises - Lower Extremity Long Arc Quad: Seated;AROM;Strengthening;Both;20 reps Hip Flexion/Marching: Seated;AROM;Strengthening;Both;20 reps Other Exercises Other Exercises: 5 STS reps, min G with RW    General Comments General comments (skin integrity, edema, etc.): Pt on RA, mild SOB, able to continue conversation with activity, improves with seated rest break      Pertinent Vitals/Pain Pain Assessment: No/denies pain    Home Living                      Prior Function            PT Goals (current goals can now be found in the care plan section) Acute Rehab PT Goals Patient Stated Goal: Not to overdo it. PT Goal Formulation: With patient Time For Goal Achievement: 05/18/20 Potential to Achieve Goals: Good Progress towards PT goals: Progressing toward goals    Frequency    Min 2X/week      PT Plan Current plan remains appropriate    Co-evaluation              AM-PAC PT "6 Clicks" Mobility   Outcome Measure  Help needed turning from your back to your side while in a  flat bed without using bedrails?: None Help needed moving from lying on your back to sitting on the side of a flat bed without using bedrails?: None Help needed moving to and from a bed to a chair (including a wheelchair)?: A Little Help needed standing up from a chair using your arms (e.g., wheelchair or bedside chair)?: A Little Help needed to walk in hospital room?: A Little Help needed climbing 3-5  steps with a railing? : A Lot 6 Click Score: 19    End of Session   Activity Tolerance: Patient tolerated treatment well Patient left: in chair;with call bell/phone within reach;with chair alarm set Nurse Communication: Mobility status;Other (comment) (wants to get dressed, d/c questions) PT Visit Diagnosis: Other abnormalities of gait and mobility (R26.89);Unsteadiness on feet (R26.81)     Time: 9977-4142 PT Time Calculation (min) (ACUTE ONLY): 13 min  Charges:  $Therapeutic Exercise: 8-22 mins                      Tori Florentino Laabs PT, DPT 05/10/20, 2:50 PM

## 2020-05-10 NOTE — Discharge Summary (Signed)
Physician Discharge Summary  Terry Page. VFI:[id number] DOB: [birth year] DOA: 04/10/2020  PCP: Terry Macadam, MD  Admit date: 04/10/2020 Discharge date: 05/10/2020  Admitted From: Home  Disposition:  SNF   Recommendations for Outpatient Follow-up and new medication changes:  1. Follow up with Terry Page in 7 to 10 days.  2. Plan to continue with Augmentin until May 29, 2020.   Home Health: na  Equipment/Devices: na     Discharge Condition: stable  CODE STATUS:  full Diet recommendation:  Heart healthy   Brief/Interim Summary: Terry Page was admitted to the hospitalwith theworking diagnosis of acute hypoxic respiratory failure secondary to SARS COVID-19 viral pneumonia.  Hospitalization complicated by COPD exacerbation, healthcare associated pneumonia, necrotizing pneumonia and small bowel obstruction.  65 year old male past medical history for COPD,spontaneous pneumothorax 2019-2021, hypertension,SIADH, dyslipidemia, prostate cancer, left lung cancer who presented with worsening dyspnea. Patient reported rapid onset of progressive dyspnea, severe in intensity, associated with nonproductive cough and generalized weakness. On his initial evaluation patient was found in respiratory distress.Blood pressure 145/96, heart rate 107, respiratoryrate25, temp 97.9, oxygen saturation 96%, his lungs had scattered rhonchi bilaterally, positive rales, and expiratory wheezing. Prolonged expiratory phase. Significant increased work of breathing, heart S1-S2present, tachycardic, abdomen soft nontender, no lower extremity edema.  Sodium 119, potassium 4.0, chloride 84, bicarb 22, glucose 142, BUN 19, creatinine 0.93, white count 36.3, hemoglobin 9.7, hematocrit 34.1, platelets 502. Urinalysis specific gravity 1.031, 0-5 red cells, 0-5 white cells.  Negative protein  Chest radiograph with right upper lobe interstitial infiltrate, left middle lobe atelectasis, left lower lobe interstitial  infiltrate.  Positive hyperinflation.  CT chest with extensive patchy and confluent airspace opacity in the right upper lobe.  Small right pleural effusion.  Extensive bullous emphysema.  Positive pulmonary nodules: right upper lobe 1.2 x 1.1 cm.  Right lower lobe 1.5 x 1.3 cm.  Spiculated left upper lobe 1.2 x 0.9 cm  Patient tested positive for COVID-19, his chest radiograph had a multifocal infiltrates. CT chest negative for pulmonary embolism. He was severely hyponatremic with sodium 119, and severe leukocytosis 36.3.  Patient had a prolonged hospitalization,complicated with small bowel obstruction, COPD exacerbation and healthcare associated pneumonia. He did require noninvasive mechanical ventilation in the stepdown unit.  Slowly he has been improving, continue to be very weak and deconditioned,pending transfer to SNF to continue therapy.  1.  Acute hypoxemic respiratory failure, SARS COVID-19 viral pneumonia, healthcare associated pneumonia, multifocal, severe sepsis, present on admission.   Patient received supplemental oxygen per nasal cannula, received medical therapy with remdesivir and systemic corticosteroids. Broad-spectrum IV antibiotic therapy with ceftriaxone, azithromycin, cefazolin and vancomycin.  His blood cultures were positive for Staph hominis.  Transthoracic echocardiography no evidence of vegetation.  He completed antibiotic therapy with good toleration.  Elevated liver enzymes due to COVID 19 viral illness, resolved.   Patient was found to have a necrotizing right upper lobe infection in the context of severe bullous emphysema.  Recommendations to continue oral Augmentin for 4 weeks.   2.  Acute COPD exacerbation.  Continue bronchodilator therapy, his oxygenation discharge is 93 to 95% on room air.  Clinically now resolved.  3.  SIADH, hyponatremia, hypomagnesemia, hypokalemia.  Patient received supportive medical therapy, his electrolytes were corrected. At  discharge sodium 132, potassium 3.7, chloride 97, bicarb 25, glucose 118, BUN 12, creatinine 0.37.  4.  Small bowel obstruction.  Patient was diagnosed with bowel obstruction during hospitalization, he required medical therapy, including transitory NG tube.  He responded well, at this point he is tolerating p.o. diet adequately, positive bowel movements.  No further nausea or vomiting.  5.  Hypertension/dyslipidemia..  Continue blood pressure control with amlodipine and irbesartan. Statin was held due to elevated liver enzymes.  6.  Swallow dysfunction, severe protein calorie malnutrition.  Patient was evaluated by speech therapy, recommendations to continue with dysphagia 3 diet.  Aspiration precautions. Continue attritional supplements.  7.  Left upper lobe lung cancer.  Patient initially managed with radiation therapy.  Patient will follow-up as an outpatient with radiation oncology. Acute on chronic back pain, further work-up with lumbar spine MRI showed no acute findings.  8.  History of prostate cancer.  S/p radiation therapy.  Discharge Diagnoses:  Principal Problem:   Acute respiratory failure with hypoxia ([name]) Active Problems:   COPD with acute exacerbation ([name])   Pneumonia due to COVID-19 virus   Primary cancer of left upper lobe of lung ([name])   Cavitary pneumonia   SIADH (syndrome of inappropriate ADH production) ([name])   Essential hypertension   Mixed hyperlipidemia   Sepsis (Terry Page)   Bullous emphysema ([name])   Necrotizing pneumonia ([name])   Abdominal pain   Protein-calorie malnutrition, severe    Discharge Instructions   Allergies as of 05/10/2020   No Known Allergies     Medication List    STOP taking these medications   amLODipine 10 MG tablet Commonly known as: NORVASC   docusate sodium 100 MG capsule Commonly known as: COLACE     TAKE these medications   acetaminophen 325 MG tablet Commonly known as: TYLENOL Take 2 tablets (650 mg total) by mouth  every 6 (six) hours as needed for mild pain or headache (fever >/= 101).   albuterol 108 (90 Base) MCG/ACT inhaler Commonly known as: VENTOLIN HFA Inhale 2 puffs into the lungs every 6 (six) hours as needed for wheezing or shortness of breath.   alum & mag hydroxide-simeth 200-200-20 MG/5ML suspension Commonly known as: MAALOX/MYLANTA Take 15 mLs by mouth every 6 (six) hours as needed for indigestion or heartburn.   amoxicillin-clavulanate 875-125 MG tablet Commonly known as: AUGMENTIN Take 1 tablet by mouth every 12 (twelve) hours for 19 days.   aspirin EC 81 MG tablet Take 81 mg by mouth daily.   atorvastatin 20 MG tablet Commonly known as: LIPITOR Take 20 mg by mouth daily.   diphenoxylate-atropine 2.5-0.025 MG tablet Commonly known as: LOMOTIL Take 1 tablet by mouth 4 (four) times daily as needed for diarrhea or loose stools.   feeding supplement Liqd Take 237 mLs by mouth daily.   ipratropium-albuterol 0.5-2.5 (3) MG/3ML Soln Commonly known as: DUONEB Take 3 mLs by nebulization every 6 (six) hours as needed.   lactobacillus Pack Take 1 packet (1 g total) by mouth 3 (three) times daily with meals.   lipase/protease/amylase 12000-38000 units Cpep capsule Commonly known as: CREON Take 1 capsule (12,000 Units total) by mouth 3 (three) times daily before meals.   multivitamin with minerals Tabs tablet Take 1 tablet by mouth daily. Start taking on: May 11, 2020   naproxen sodium 220 MG tablet Commonly known as: ALEVE Take 220 mg by mouth daily as needed (pain).   olmesartan 40 MG tablet Commonly known as: BENICAR Take 40 mg by mouth daily.   polyethylene glycol 17 g packet Commonly known as: MIRALAX / GLYCOLAX Take 17 g by mouth 2 (two) times daily.            Durable Medical Equipment  (  From admission, onward)         Start     Ordered   05/04/20 1356  For home use only DME oxygen  Once       Question Answer Comment  Length of Need 6 Months   Mode  or (Route) Nasal cannula   Liters per Minute 5   Frequency Continuous (stationary and portable oxygen unit needed)   Oxygen delivery system Gas      05/04/20 1356          Contact information for follow-up providers    [facility] Follow up.   Specialty: Home Health Services Why: Will follow you at discharge. Contact information: [address] [place] [address] [phone]        Melvenia Needles, NP Follow up on 05/19/2020.   Specialty: Pulmonary Disease Why: Your appointment is at 10:30AM. Contact information: [facility] [address] [place] [address] [phone]            Contact information for after-discharge care    Destination    [facility] .   Service: Skilled Nursing Contact information: [place] [phone]                 No Known Allergies  Consultations:  Infectious disease, surgery, critical care.   Procedures/Studies: DG Chest 2 View  Result Date: 04/30/2020 CLINICAL DATA:  Shortness of breath.  History of left lung cancer. EXAM: CHEST - 2 VIEW COMPARISON:  April 26, 2020. FINDINGS: The heart size and mediastinal contours are within normal limits. Loculated hydropneumothorax or cavitating pneumonia is noted in right upper lobe. Stable left midlung scarring is noted. The visualized skeletal structures are unremarkable. IMPRESSION: Air-fluid level is noted in right upper lobe consistent with loculated hydropneumothorax or cavitating pneumonia. CT scan is recommended for further evaluation. Electronically Signed   By: Marijo Conception M.D.   On: 04/30/2020 12:25   DG Abd 1 View  Result Date: 04/16/2020 CLINICAL DATA:  Abdominal pain EXAM: ABDOMEN - 1 VIEW COMPARISON:  None. FINDINGS: Diffuse gaseous distention of bowel, most prominent in the small bowel. Favor ileus. No organomegaly or free air. No suspicious calcification. IMPRESSION: Diffuse gaseous  distention of bowel, favor ileus. Electronically Signed   By: Rolm Baptise M.D.   On: 04/16/2020 17:44   CT CHEST W CONTRAST  Result Date: 04/30/2020 CLINICAL DATA:  Pneumonia. Shortness of breath. 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 CONTRAST:  28mL OMNIPAQUE IOHEXOL 300 MG/ML  SOLN COMPARISON:  04/10/2020, 04/26/2020, 01/07/2020, 02/06/2020 (PET-CT) FINDINGS: Cardiovascular: No significant vascular findings. Normal heart size. No pericardial effusion. Thoracic aortic atherosclerosis. Multi vessel coronary artery atherosclerosis. Mediastinum/Nodes: Enlarged subcarinal lymph node measuring 12 mm. No hilar or axillary lymphadenopathy. Esophagus is normal. Trachea is normal. Thyroid gland is normal. Lungs/Pleura: Spiculated 11 mm left upper lobe pulmonary nodule. 7 mm nodular area in the left upper lobe more inferiorly adjacent to a bulla similar in appearance to the prior examination likely reflecting area of scarring. Bilateral emphysematous changes with bullous disease at the apices. Severe right upper lobe airspace disease. Interval development of cavitary areas in the right upper lobe with multiple air-fluid levels most concerning for necrotizing pneumonia with the overall area measuring approximately 9.2 x 7.8 cm in transverse dimension. Small right pleural effusion.  Interstitial and alveolar airspace disease in the right middle lobe, bilateral lower lobes concerning for multilobar pneumonia. Upper Abdomen: No acute abnormality.  Atrophic spleen. Musculoskeletal: No acute osseous abnormality. IMPRESSION: 1. Severe right upper lobe airspace disease with interval development of cavitary areas in the right upper lobe with multiple air-fluid levels most concerning worsening for necrotizing pneumonia. 2. Interstitial and alveolar airspace disease in the right  middle lobe, bilateral lower lobes concerning for worsening multilobar pneumonia. 3. Small right pleural effusion. 4. Spiculated 11 mm left upper lobe pulmonary nodule concerning for malignancy. 5. Aortic Atherosclerosis (ICD10-I70.0) and Emphysema (ICD10-J43.9). Electronically Signed   By: Kathreen Devoid   On: 04/30/2020 21:05   CT Angio Chest PE W and/or Wo Contrast  Result Date: 04/10/2020 CLINICAL DATA:  Cough and shortness of breath. Leukocytosis. History of lung cancer with radiation therapy. History of prostate cancer. History of smoking. EXAM: CT ANGIOGRAPHY CHEST WITH CONTRAST TECHNIQUE: Multidetector CT imaging of the chest was performed using the standard protocol during bolus administration of intravenous contrast. Multiplanar CT image reconstructions and MIPs were obtained to evaluate the vascular anatomy. CONTRAST:  153mL OMNIPAQUE IOHEXOL 350 MG/ML SOLN COMPARISON:  Portable chest obtained earlier today. Chest CT dated 01/07/2020. PET-CT dated 02/06/2020. FINDINGS: Cardiovascular: Normally opacified pulmonary arteries with no pulmonary arterial filling defects seen. Atheromatous calcifications, including the coronary arteries and aorta. Mediastinum/Nodes: Interval mildly enlarged precarinal node with a short axis diameter 12 mm on image number 63/4. Interval mildly enlarged subcarinal node with a short axis diameter of 9 mm on image number 74/4. Interval mildly enlarged right hilar node with a short axis diameter of 7 mm on image number 78/4. Unremarkable thyroid gland and esophagus. Lungs/Pleura: Interval extensive patchy and confluent airspace opacity in the right upper lobe. There is also an interval small amount of pleural fluid medially in the right upper and lower chest. Interval nodular density in the medial aspect of the right upper lobe measuring 1.2 x 1.1 cm on image number 80/6. There is also an interval nodular density in the medial aspect of the right lower lobe, measuring 1.5 x 1.3 cm  on image number 73/6. An irregular, spiculated left upper lobe nodule is again demonstrated. This measures 1.2 x 0.9 cm on image number 51/6. This measured 1.3 x 1.1 cm on 02/06/2020. Extensive bilateral bullous changes are again demonstrated. There is an interval 2.1 x 2.0 cm nodular area adjacent to the posterior aspect of a bullous in the lingula with no significant change in a smaller, similar area nearby, slightly more superiorly and laterally on image number 77/6. There is also an interval nodular density in the left lower lobe measuring 2.6 x 1.5 cm on image number 102/6. Upper Abdomen: Diffuse low density of the liver relative to the spleen. Stable atrophic splenic remnant. Partially included right renal cysts. Musculoskeletal: Review of the MIP images confirms the above findings. IMPRESSION: 1. Interval extensive patchy and confluent airspace opacity in the right upper lobe, compatible with pneumonia. 2. Interval small amount of right pleural fluid. 3. Interval mild mediastinal and right hilar adenopathy, most likely reactive. 4. Interval nodular densities in both lungs, as described above. These are concerning for possible metastases. 5. Interval slight decrease in size of a small spiculated nodule in the left upper lobe compatible with a metastasis or additional primary lung carcinoma. 6. 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 7. Diffuse hepatic steatosis. 8. Calcific coronary artery and aortic atherosclerosis. 9.  Calcific coronary artery and aortic atherosclerosis.  Aortic Atherosclerosis (ICD10-I70.0) and Emphysema (ICD10-J43.9). Electronically Signed   By: Claudie Revering M.D.   On: 04/10/2020 20:01   MR LUMBAR SPINE WO CONTRAST  Result Date: 04/24/2020 CLINICAL DATA:  Acute worsening of low back pain. Recent bacteremia. Concern for spinal infection. EXAM: MRI LUMBAR SPINE WITHOUT CONTRAST TECHNIQUE: Multiplanar, multisequence MR imaging of the lumbar spine was performed. No intravenous  contrast was administered. COMPARISON:  CT abdomen and pelvis 04/16/2020 FINDINGS: Segmentation:  Standard. Alignment:  Normal. Vertebrae: No fracture, suspicious osseous lesion, significant marrow edema, or evidence of discitis. Conus medullaris and cauda equina: The conus terminates superior to the field of view. The cauda equina is unremarkable. Paraspinal and other soft tissues: Mild nonspecific soft tissue edema and/or trace fluid in the retroperitoneal soft tissues anterior to the lower lumbar spine and in the presacral region, also present on the prior CT. No paraspinal fluid collection. Disc levels: T12-L1 and L1-2: Negative. L2-3: Disc desiccation. Minimal left eccentric disc bulging and mild facet arthrosis without stenosis. L3-4: Minimal disc bulging and mild to moderate facet arthrosis without stenosis. L4-5: Prior left laminotomy. Minimal disc bulging and moderate right and severe left facet arthrosis result in mild right neural foraminal stenosis without spinal stenosis. L5-S1: Mild disc desiccation. Minimal disc bulging without stenosis. Effacement of the thecal sac by epidural fat. IMPRESSION: 1. No evidence of lumbar spine infection. 2. Mild lumbar spondylosis and moderate to severe facet arthrosis without spinal stenosis. 3. Mild right neural foraminal stenosis at L4-5. Electronically Signed   By: Logan Bores M.D.   On: 04/24/2020 14:41   CT ABDOMEN PELVIS W CONTRAST  Result Date: 04/25/2020 CLINICAL DATA:  Recent small-bowel obstruction with a current symptoms. Elevated LFTs with fever. EXAM: CT ABDOMEN AND PELVIS WITH CONTRAST TECHNIQUE: Multidetector CT imaging of the abdomen and pelvis was performed using the standard protocol following bolus administration of intravenous contrast. CONTRAST:  166mL OMNIPAQUE IOHEXOL 300 MG/ML  SOLN COMPARISON:  Abdominal CT 9 days ago 04/16/2020 FINDINGS: Lower chest: Small bilateral pleural effusions are new from prior exam. Left greater than right lower  lobe opacity is greater than typically seen with atelectasis and suspicious for pneumonia. Patchy ground-glass opacities in the right middle lobe also new and likely infectious. Hepatobiliary: Small low-density lesions scattered throughout the liver are unchanged from recent CT. Largest is in the central liver measuring 10 mm, series 2, image 12. No new hepatic abnormality. Mild hepatic steatosis. Gallbladder physiologically distended, no calcified stone. No biliary dilatation. Pancreas: No ductal dilatation or inflammation. Spleen: Irregular atrophic spleen, stable. Adrenals/Urinary Tract: Normal adrenal glands. Similar bilateral perinephric edema which is likely chronic. Extrarenal pelvis configuration of the left kidney. Similar appearance of bilateral renal cysts. No evidence of renal stone or suspicious renal abnormality. Unremarkable urinary bladder. Stomach/Bowel: Small hiatal hernia. Decompressed stomach. The previous small bowel dilatation has improved from prior exam. Administered enteric contrast reaches the cecum. No small bowel obstruction. No small bowel inflammation. Normal appendix. Liquid stool throughout the colon. Few colonic air-fluid levels. Scattered colonic diverticula. No diverticulitis, colonic wall thickening or pericolonic edema. Sigmoid colonic redundancy. Vascular/Lymphatic: Advanced aortic and branch atherosclerosis. Patent portal vein. No abdominopelvic adenopathy. Reproductive: Prostate is unremarkable. Other: Small amount of free fluid in the pericolic gutters and pelvis, with mild increase from prior exam. No free air. No abscess or focal fluid collection. Body wall edema is increased. Mild presacral edema, nonspecific. Minimal fat in the inguinal canals without frank hernia. Musculoskeletal: Advanced right hip osteoarthritis. Multilevel degenerative change  in the spine. There are no acute or suspicious osseous abnormalities. IMPRESSION: 1. Resolved small bowel dilatation from prior  exam. Administered enteric contrast reaches the cecum. 2. Liquid stool throughout the colon with scattered air-fluid levels, most consistent with diarrheal illness. No colonic wall thickening or inflammation. 3. Small hepatic hypodensities are stable from prior exam. These likely represent cysts. These are stable from January 2022 included portion of chest CT. Mild hepatic steatosis. 4. Small bilateral pleural effusions are new from prior exam. Left greater than right lower lobe opacity is greater than typically seen with atelectasis and suspicious for pneumonia. Patchy ground-glass opacities in the right middle lobe are also new and likely infectious. 5. Small amount of free fluid in the pericolic gutters and pelvis, with mild increase from prior exam. Body wall edema is increased. 6. Advanced aortic atherosclerosis. Aortic Atherosclerosis (ICD10-I70.0). Electronically Signed   By: Keith Rake M.D.   On: 04/25/2020 18:20   CT ABDOMEN PELVIS W CONTRAST  Result Date: 04/16/2020 CLINICAL DATA:  Abdominal distension, short of breath, history of left upper lobe lung cancer EXAM: CT ABDOMEN AND PELVIS WITH CONTRAST TECHNIQUE: Multidetector CT imaging of the abdomen and pelvis was performed using the standard protocol following bolus administration of intravenous contrast. CONTRAST:  147mL OMNIPAQUE IOHEXOL 300 MG/ML  SOLN COMPARISON:  04/16/2020, 02/06/2020 FINDINGS: Lower chest: Bullous emphysematous changes are again noted. There is a new cavitating left lower lobe pulmonary nodule, measuring 1.9 x 1.0 cm image 19/4, consistent with new metastatic disease. Nonspecific subpleural nodule within the left lower lobe is also new since prior study, measuring approximately 0.9 x 0.5 cm. Hepatobiliary: Small hepatic hypodensities consistent with cysts, stable. No biliary dilation. The gallbladder is unremarkable. Pancreas: Unremarkable. No pancreatic ductal dilatation or surrounding inflammatory changes. Spleen:  Stable atrophic spleen. Adrenals/Urinary Tract: There are stable bilateral renal cortical cysts. No urinary tract calculi or obstructive uropathy. The adrenals and bladder are unremarkable. Stomach/Bowel: There is marked dilation of the proximal small bowel, measuring up to 3.9 cm in diameter, with multiple gas fluid levels. Transition within the mid to distal jejunum within the right mid abdomen consistent with small-bowel obstruction. There is no bowel wall thickening. Normal appendix right lower quadrant. Colon is decompressed, with scattered diverticulosis. Vascular/Lymphatic: Aortic atherosclerosis. No enlarged abdominal or pelvic lymph nodes. Reproductive: Prostate is unremarkable. Other: Trace free fluid within the lower abdomen and pelvis. No free intraperitoneal gas. No abdominal wall hernia. Musculoskeletal: There are no acute or destructive bony lesions. Significant bilateral hip osteoarthritis. Reconstructed images demonstrate no additional findings. IMPRESSION: 1. High-grade small bowel obstruction, with transition in the mid to distal jejunum in the right mid abdomen. 2. New left lower lobe pulmonary nodules, the larger of which demonstrates central cavitation. Findings are concerning for progressive metastatic lung cancer. These nodules are new since prior PET-CT 02/06/2020. 3. Trace free fluid. 4.  Aortic Atherosclerosis (ICD10-I70.0). Electronically Signed   By: Randa Ngo M.D.   On: 04/16/2020 22:28   DG CHEST PORT 1 VIEW  Result Date: 05/01/2020 CLINICAL DATA:  Worsening shortness of breath and cough. EXAM: PORTABLE CHEST 1 VIEW COMPARISON:  CT scan April 30, 2020.  Chest x-ray April 30, 2020. FINDINGS: The right upper lobe infiltrate with associated cavitation is stable. The spiculated nodule in the left upper lobe is unchanged. Emphysematous changes are noted. Mild bibasilar opacities are stable. Scarring in the left perihilar region is stable. No other interval changes. IMPRESSION: 1.  Persistent, stable cavitary infiltrate in the right upper  lobe. 2. The spiculated nodule in the left upper lobe is seen on the CT scan from April 30, 2020, concerning for primary malignancy. 3. No other interval changes. Electronically Signed   By: Dorise Bullion III M.D   On: 05/01/2020 12:28   DG Chest Port 1 View  Result Date: 04/26/2020 CLINICAL DATA:  Closed emphysema, left upper lobe nodule, worsening productive cough EXAM: PORTABLE CHEST 1 VIEW COMPARISON:  04/10/2020 FINDINGS: Increased consolidation in the right upper lung. Increased bibasilar consolidation. No significant pleural effusion. No pneumothorax. Stable cardiomediastinal contours. IMPRESSION: Increased consolidation in the right upper lung may be parenchymal or within bullous cavity. Additional bibasilar consolidation. Pneumonia suspected. Electronically Signed   By: Macy Mis M.D.   On: 04/26/2020 11:53   DG Chest Portable 1 View  Result Date: 04/10/2020 CLINICAL DATA:  Cough, shortness of breath, history of bilateral lung cancer EXAM: PORTABLE CHEST 1 VIEW COMPARISON:  Chest radiographs, 06/13/2019, CT chest, 01/06/2018 FINDINGS: The heart size and mediastinal contours are within normal limits. Heterogeneous airspace opacity of the peripheral right upper lobe. Bandlike scarring of the left midlung and spiculated nodule of the central left upper lobe. The visualized skeletal structures are unremarkable. IMPRESSION: 1. Heterogeneous airspace opacity of the peripheral right upper lobe, consistent with infection. 2. Bandlike scarring of the left midlung and spiculated nodule of the central left upper lobe, better assessed by prior CT. Electronically Signed   By: Eddie Candle M.D.   On: 04/10/2020 18:13   DG Abd Portable 1V  Result Date: 04/28/2020 CLINICAL DATA:  Abdominal distension. EXAM: PORTABLE ABDOMEN - 1 VIEW COMPARISON:  April 18, 2020. FINDINGS: The bowel gas pattern is normal. No radio-opaque calculi or other significant  radiographic abnormality are seen. IMPRESSION: Negative. Electronically Signed   By: Marijo Conception M.D.   On: 04/28/2020 15:54   DG Abd Portable 1V  Result Date: 04/18/2020 CLINICAL DATA:  NG tube placement EXAM: PORTABLE ABDOMEN - 1 VIEW COMPARISON:  April 16, 2020 FINDINGS: Nasogastric tube is identified distal tip in the proximal stomach. Bowel gas pattern is stable compared prior exam. IMPRESSION: Nasogastric tube with distal tip in the proximal stomach. Electronically Signed   By: Abelardo Diesel M.D.   On: 04/18/2020 08:52   ECHOCARDIOGRAM COMPLETE  Result Date: 04/14/2020    ECHOCARDIOGRAM REPORT   Patient Name:   Terry Page. Date of Exam: 04/14/2020 Medical Rec #:  [id number]            Height:       69.0 in Accession #:    [id number]           Weight:       168.0 lb Date of Birth:  September 01, 1955             BSA:          1.918 m Patient Age:    65 years             BP:           129/92 mmHg Patient Gender: M                    HR:           89 bpm. Exam Location:  Inpatient Procedure: 2D Echo, Cardiac Doppler and Color Doppler Indications:    Bacteremia  History:        Patient has no prior history of Echocardiogram examinations.  Risk Factors:Current Smoker, Hypertension and Dyslipidemia.                 Covid positive. Lung cancer. ETOH. Pneumothorax.  Sonographer:    Roseanna Rainbow RDCS Referring Phys: [id number] [place]  Sonographer Comments: Technically difficult study due to poor echo windows. IMPRESSIONS  1. Left ventricular ejection fraction, by estimation, is 65 to 70%. The left ventricle has normal function. The left ventricle has no regional wall motion abnormalities. There is moderate concentric left ventricular hypertrophy. Left ventricular diastolic parameters are consistent with Grade I diastolic dysfunction (impaired relaxation).  2. Right ventricular systolic function is normal. The right ventricular size is normal.  3. The mitral valve is normal in structure.  Trivial mitral valve regurgitation. No evidence of mitral stenosis.  4. The aortic valve has an indeterminant number of cusps. Aortic valve regurgitation is trivial. No aortic stenosis is present.  5. Aortic dilatation noted. 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  6. The inferior vena cava is normal in size with greater than 50% respiratory variability, suggesting right atrial pressure of 3 mmHg. Comparison(s): No prior Echocardiogram. Conclusion(s)/Recommendation(s): No evidence of valvular vegetations on this transthoracic echocardiogram. Would recommend a transesophageal echocardiogram to exclude infective endocarditis if clinically indicated. FINDINGS  Left Ventricle: Left ventricular ejection fraction, by estimation, is 65 to 70%. The left ventricle has normal function. The left ventricle has no regional wall motion abnormalities. The left ventricular internal cavity size was normal in size. There is  moderate concentric left ventricular hypertrophy. Left ventricular diastolic parameters are consistent with Grade I diastolic dysfunction (impaired relaxation). Right Ventricle: The right ventricular size is normal. No increase in right ventricular wall thickness. Right ventricular systolic function is normal. Left Atrium: Left atrial size was normal in size. Right Atrium: Right atrial size was normal in size. Pericardium: There is no evidence of pericardial effusion. Mitral Valve: The mitral valve is normal in structure. Trivial mitral valve regurgitation. No evidence of mitral valve stenosis. Tricuspid Valve: The tricuspid valve is normal in structure. Tricuspid valve regurgitation is trivial. Aortic Valve: The aortic valve has an indeterminant number of cusps. Aortic valve regurgitation is trivial. No aortic stenosis is present. Pulmonic Valve: The pulmonic valve was not well visualized. Pulmonic valve regurgitation is not visualized. Aorta: Aortic dilatation noted. There is mild  dilatation of the ascending aorta, measuring 37 mm. Venous: The inferior vena cava is normal in size with greater than 50% respiratory variability, suggesting right atrial pressure of 3 mmHg. IAS/Shunts: No atrial level shunt detected by color flow Doppler.  LEFT VENTRICLE PLAX 2D LVIDd:         3.90 cm     Diastology LVIDs:         2.20 cm     LV e' medial:    7.94 cm/s LV PW:         1.70 cm     LV E/e' medial:  7.6 LV IVS:        1.40 cm     LV e' lateral:   6.96 cm/s LVOT diam:     2.10 cm     LV E/e' lateral: 8.7 LV SV:         55 LV SV Index:   29 LVOT Area:     3.46 cm  LV Volumes (MOD) LV vol d, MOD A2C: 50.9 ml LV vol d, MOD A4C: 76.6 ml LV vol s, MOD A2C: 16.1 ml LV vol s,  MOD A4C: 22.3 ml LV SV MOD A2C:     34.8 ml LV SV MOD A4C:     76.6 ml LV SV MOD BP:      44.8 ml RIGHT VENTRICLE             IVC RV S prime:     13.40 cm/s  IVC diam: 1.40 cm TAPSE (M-mode): 1.5 cm LEFT ATRIUM             Index       RIGHT ATRIUM           Index LA diam:        2.60 cm 1.36 cm/m  RA Area:     10.20 cm LA Vol (A2C):   29.9 ml 15.59 ml/m RA Volume:   18.40 ml  9.59 ml/m LA Vol (A4C):   21.0 ml 10.95 ml/m LA Biplane Vol: 25.5 ml 13.29 ml/m  AORTIC VALVE LVOT Vmax:   97.50 cm/s LVOT Vmean:  64.300 cm/s LVOT VTI:    0.160 m  AORTA Ao Root diam: 3.60 cm Ao Asc diam:  3.70 cm MITRAL VALVE MV Area (PHT): 2.87 cm    SHUNTS MV Decel Time: 265 msec    Systemic VTI:  0.16 m MV E velocity: 60.60 cm/s  Systemic Diam: 2.10 cm MV A velocity: 82.70 cm/s MV E/A ratio:  0.73 Gwyndolyn Kaufman MD Electronically signed by Gwyndolyn Kaufman MD Signature Date/Time: 04/14/2020/1:40:21 PM    Final    ECHOCARDIOGRAM LIMITED  Result Date: 05/02/2020    ECHOCARDIOGRAM LIMITED REPORT   Patient Name:   Terry Page. Date of Exam: 05/02/2020 Medical Rec #:  [id number]            Height:       69.0 in Accession #:    [id number]           Weight:       152.8 lb Date of Birth:  [birth year]             BSA:          1.843 m Patient Age:    21  years             BP:           112/64 mmHg Patient Gender: M                    HR:           92 bpm. Exam Location:  Inpatient Procedure: Limited Echo and Limited Color Doppler Indications:    Bacteremia R78.81  History:        Patient has prior history of Echocardiogram examinations, most                 recent 04/14/2020. Risk Factors:Current Smoker and Hypertension.                 COVID-19. Cancer. Alcohol abuse.  Sonographer:    Tiffany Dance Referring Phys: [id number] [place]  1. Left ventricular ejection fraction, by estimation, is 65 to 70%. The left ventricle has normal function. The left ventricle has no regional wall motion abnormalities.  2. Right ventricular systolic function is normal. The right ventricular size is normal.  3. There is a rounded density off the anterior mitral valve leaflet that likely is calcification, but in the setting of bacteremia, cannot rule out vegetation. The lack of any significant MR makes endocarditis less likely. No evidence  of mitral valve regurgitation. No evidence of mitral stenosis.  4. The aortic valve is normal in structure. Aortic valve regurgitation is not visualized. No aortic stenosis is present.  5. There is mild dilatation of the aortic root, measuring 40 mm.  6. The inferior vena cava is normal in size with greater than 50% respiratory variability, suggesting right atrial pressure of 3 mmHg. FINDINGS  Left Ventricle: Left ventricular ejection fraction, by estimation, is 65 to 70%. The left ventricle has normal function. The left ventricle has no regional wall motion abnormalities. The left ventricular internal cavity size was normal in size. There is  no left ventricular hypertrophy. Right Ventricle: The right ventricular size is normal. No increase in right ventricular wall thickness. Right ventricular systolic function is normal. Left Atrium: Left atrial size was normal in size. Right Atrium: Right atrial size was normal in size. Pericardium:  There is no evidence of pericardial effusion. Mitral Valve: There is a rounded density off the anterior mitral valve leaflet that likely is calcification, but in the setting of bacteremia, cannot rule out vegetation. The mitral valve is normal in structure. No evidence of mitral valve stenosis. Tricuspid Valve: The tricuspid valve is normal in structure. Tricuspid valve regurgitation is not demonstrated. No evidence of tricuspid stenosis. Aortic Valve: The aortic valve is normal in structure. Aortic valve regurgitation is not visualized. No aortic stenosis is present. Pulmonic Valve: The pulmonic valve was normal in structure. Pulmonic valve regurgitation is trivial. No evidence of pulmonic stenosis. Aorta: The aortic root is normal in size and structure. There is mild dilatation of the aortic root, measuring 40 mm. Venous: The inferior vena cava is normal in size with greater than 50% respiratory variability, suggesting right atrial pressure of 3 mmHg. IAS/Shunts: The interatrial septum appears to be lipomatous. No atrial level shunt detected by color flow Doppler. LEFT VENTRICLE PLAX 2D LVIDd:         4.30 cm LVIDs:         3.60 cm LV PW:         1.20 cm LV IVS:        1.10 cm LVOT diam:     2.20 cm LVOT Area:     3.80 cm  IVC IVC diam: 1.20 cm LEFT ATRIUM         Index LA diam:    2.80 cm 1.52 cm/m   AORTA Ao Root diam: 4.00 cm  SHUNTS Systemic Diam: 2.20 cm Fransico Him MD Electronically signed by Fransico Him MD Signature Date/Time: 05/02/2020/1:27:17 PM    Final         Subjective: Patient feeling better, no nausea or vomiting, no further diarrhea, dyspnea has been improving.   Discharge Exam: Vitals:   05/10/20 0503 05/10/20 0822  BP: 134/82   Pulse: 87   Resp: 20   Temp: 98.1 F (36.7 C)   SpO2: 98% 98%   Vitals:   05/09/20 1531 05/09/20 2202 05/10/20 0503 05/10/20 0822  BP: 110/71 123/73 134/82   Pulse: 100 99 87   Resp: 18 20 20    Temp: 98.1 F (36.7 C) 98.5 F (36.9 C) 98.1 F  (36.7 C)   TempSrc: Oral Oral Oral   SpO2: 96% 96% 98% 98%  Weight:      Height:        General: Not in pain or dyspnea.  Neurology: Awake and alert, non focal  E ENT: no pallor, no icterus, oral mucosa moist Cardiovascular: No JVD. S1-S2 present, rhythmic,  no gallops, rubs, or murmurs. No lower extremity edema. Pulmonary: positive breath sounds bilaterally, adequate air movement, no wheezing, rhonchi or rales. Gastrointestinal. Abdomen mild distended, but not tender Skin. No rashes Musculoskeletal: no joint deformities   The results of significant diagnostics from this hospitalization (including imaging, microbiology, ancillary and laboratory) are listed below for reference.     Microbiology: Recent Results (from the past 240 hour(s))  Culture, blood (x 2)     Status: None   Collection Time: 05/01/20 12:38 PM   Specimen: BLOOD  Result Value Ref Range Status   Specimen Description   Final    BLOOD RIGHT ANTECUBITAL Performed at [facility] [address] [place], [place] [address]    Special Requests   Final    BOTTLES DRAWN AEROBIC ONLY Blood Culture adequate volume Performed at [facility] [address] [place], [place] [address]    Culture   Final    NO GROWTH 5 DAYS Performed at [facility], [place] [address] [place], [place] [address]    Report Status 05/06/2020 FINAL  Final  Culture, blood (x 2)     Status: None   Collection Time: 05/01/20  3:21 PM   Specimen: BLOOD LEFT HAND  Result Value Ref Range Status   Specimen Description   Final    BLOOD LEFT HAND Performed at [facility], [place] [address], [place], [place] [address]    Special Requests   Final    BOTTLES DRAWN AEROBIC AND ANAEROBIC Blood Culture results may not be optimal due to an excessive volume of blood received in culture bottles Performed at [facility] [address] [place], [place] [address]    Culture   Final     NO GROWTH 5 DAYS Performed at [facility], [place] [address] [place], [place] [address]    Report Status 05/06/2020 FINAL  Final  Blastomyces Antigen     Status: None   Collection Time: 05/01/20  5:19 PM   Specimen: Blood  Result Value Ref Range Status   Blastomyces Antigen None Detected None Detected ng/mL Final    Comment: (NOTE) Results reported as ng/mL in 0.2 - 14.7 ng/mL range Results above the limit of detection but below 0.2 ng/mL are reported as 'Positive, Below the Limit of Quantification' Results above 14.7 ng/mL are reported as 'Positive, Above the Limit of Quantification'    Specimen Type SERUM  Final    Comment: (NOTE) Performed At: [place], [place] [id number] Bruce Donath MD LN:[id number]   SARS CORONAVIRUS 2 (TAT 6-24 HRS) Nasopharyngeal Nasopharyngeal Swab     Status: None   Collection Time: 05/05/20 12:44 PM   Specimen: Nasopharyngeal Swab  Result Value Ref Range Status   SARS Coronavirus 2 NEGATIVE NEGATIVE Final    Comment: (NOTE) SARS-CoV-2 target nucleic acids are NOT DETECTED.  The SARS-CoV-2 RNA is generally detectable in upper and lower respiratory specimens during the acute phase of infection. Negative results do not preclude SARS-CoV-2 infection, do not rule out co-infections with other pathogens, and should not be used as the sole basis for treatment or other patient management decisions. Negative results must be combined with clinical observations, patient history, and epidemiological information. The expected result is Negative.  Fact Sheet for Patients: SugarRoll.be  Fact Sheet for Healthcare Providers: https://www.woods-mathews.com/  This test is not yet approved or cleared by the [place] FDA and  has been  authorized for detection and/or diagnosis of SARS-CoV-2 by FDA under an Emergency Use Authorization (EUA). This EUA will remain  in effect  (meaning this test can be used) for the duration of the COVID-19 declaration under Se ction 564(b)(1) of the Act, 21 U.S.C. section 360bbb-3(b)(1), unless the authorization is terminated or revoked sooner.  Performed at [facility], [place] [address] [place], [place] [address]      Labs: BNP (last 3 results) No results for input(s): BNP in the last 8760 hours. Basic Metabolic Panel: Recent Labs  Lab 05/06/20 0125  NA 132*  K 3.7  CL 97*  CO2 25  GLUCOSE 118*  BUN 12  CREATININE 0.37*  CALCIUM 8.5*  MG 1.4*   Liver Function Tests: No results for input(s): AST, ALT, ALKPHOS, BILITOT, PROT, ALBUMIN in the last 168 hours. No results for input(s): LIPASE, AMYLASE in the last 168 hours. No results for input(s): AMMONIA in the last 168 hours. CBC: No results for input(s): WBC, NEUTROABS, HGB, HCT, MCV, PLT in the last 168 hours. Cardiac Enzymes: No results for input(s): CKTOTAL, CKMB, CKMBINDEX, TROPONINI in the last 168 hours. BNP: Invalid input(s): POCBNP CBG: No results for input(s): GLUCAP in the last 168 hours. D-Dimer No results for input(s): DDIMER in the last 72 hours. Hgb A1c No results for input(s): HGBA1C in the last 72 hours. Lipid Profile No results for input(s): CHOL, HDL, LDLCALC, TRIG, CHOLHDL, LDLDIRECT in the last 72 hours. Thyroid function studies No results for input(s): TSH, T4TOTAL, T3FREE, THYROIDAB in the last 72 hours.  Invalid input(s): FREET3 Anemia work up No results for input(s): VITAMINB12, FOLATE, FERRITIN, TIBC, IRON, RETICCTPCT in the last 72 hours. Urinalysis    Component Value Date/Time   COLORURINE YELLOW 04/10/2020 2051   APPEARANCEUR CLEAR 04/10/2020 2051   LABSPEC 1.031 (H) 04/10/2020 2051   PHURINE 5.0 04/10/2020 2051   GLUCOSEU NEGATIVE 04/10/2020 2051   HGBUR SMALL (A) 04/10/2020 2051   BILIRUBINUR NEGATIVE 04/10/2020 2051   KETONESUR 5 (A) 04/10/2020 2051   PROTEINUR NEGATIVE 04/10/2020 2051   NITRITE NEGATIVE  04/10/2020 2051   LEUKOCYTESUR NEGATIVE 04/10/2020 2051   Sepsis Labs Invalid input(s): PROCALCITONIN,  WBC,  LACTICIDVEN Microbiology Recent Results (from the past 240 hour(s))  Culture, blood (x 2)     Status: None   Collection Time: 05/01/20 12:38 PM   Specimen: BLOOD  Result Value Ref Range Status   Specimen Description   Final    BLOOD RIGHT ANTECUBITAL Performed at [facility], [place] [address], [place], [place] [address]    Special Requests   Final    BOTTLES DRAWN AEROBIC ONLY Blood Culture adequate volume Performed at [facility] [address] [place], [place] [address]    Culture   Final    NO GROWTH 5 DAYS Performed at [facility], [place] [address], [place], [place] [address]    Report Status 05/06/2020 FINAL  Final  Culture, blood (x 2)     Status: None   Collection Time: 05/01/20  3:21 PM   Specimen: BLOOD LEFT HAND  Result Value Ref Range Status   Specimen Description   Final    BLOOD LEFT HAND Performed at [facility], [place] [address] [place], [place] [address]    Special Requests   Final    BOTTLES DRAWN AEROBIC AND ANAEROBIC Blood Culture results may not be optimal due to an excessive volume of blood received in culture bottles Performed  at [facility], [place] [address], [place], [place] [address]    Culture   Final    NO GROWTH 5 DAYS Performed at [facility] [place] [facility], [place] [address], [place], [place] [address]    Report Status 05/06/2020 FINAL  Final  Blastomyces Antigen     Status: None   Collection Time: 05/01/20  5:19 PM   Specimen: Blood  Result Value Ref Range Status   Blastomyces Antigen None Detected None Detected ng/mL Final    Comment: (NOTE) Results reported as ng/mL in 0.2 - 14.7 ng/mL range Results above the limit of detection but below 0.2 ng/mL are reported as 'Positive, Below the Limit of Quantification' Results above 14.7 ng/mL are reported as 'Positive, Above  the Limit of Quantification'    Specimen Type SERUM  Final    Comment: (NOTE) Performed At: [place], [place] [id number] Bruce Donath MD LT:[id number]   SARS CORONAVIRUS 2 (TAT 6-24 HRS) Nasopharyngeal Nasopharyngeal Swab     Status: None   Collection Time: 05/05/20 12:44 PM   Specimen: Nasopharyngeal Swab  Result Value Ref Range Status   SARS Coronavirus 2 NEGATIVE NEGATIVE Final    Comment: (NOTE) SARS-CoV-2 target nucleic acids are NOT DETECTED.  The SARS-CoV-2 RNA is generally detectable in upper and lower respiratory specimens during the acute phase of infection. Negative results do not preclude SARS-CoV-2 infection, do not rule out co-infections with other pathogens, and should not be used as the sole basis for treatment or other patient management decisions. Negative results must be combined with clinical observations, patient history, and epidemiological information. The expected result is Negative.  Fact Sheet for Patients: SugarRoll.be  Fact Sheet for Healthcare Providers: https://www.woods-mathews.com/  This test is not yet approved or cleared by the [place] FDA and  has been authorized for detection and/or diagnosis of SARS-CoV-2 by FDA under an Emergency Use Authorization (EUA). This EUA will remain  in effect (meaning this test can be used) for the duration of the COVID-19 declaration under Se ction 564(b)(1) of the Act, 21 U.S.C. section 360bbb-3(b)(1), unless the authorization is terminated or revoked sooner.  Performed at [facility], [place] [address], [place], [place] [address]      Time coordinating discharge: 45 minutes  SIGNED:   Tawni Millers, MD  [facility] 05/10/2020, 11:13 AM

## 2020-05-10 NOTE — TOC Progression Note (Signed)
Transition of Care (TOC) - Progression Note    Patient Details  Name: Terry Page. MRN: [id number] Date of Birth: January 16, 1955  Transition of Care [facility]) CM/SW Contact  Calden Dorsey, Juliann Pulse, RN Phone Number: 05/10/2020, 10:13 AM  Clinical Narrative: Per [employer] rep Kim denied SNf-P2P set up with our PA-Dr. Earnest Bailey tel#[phone] with [employer] med advisor-Dr. Lajuan Lines before 12N.      Expected Discharge Plan: Skilled Nursing Facility Barriers to Discharge: Insurance Authorization  Expected Discharge Plan and Services Expected Discharge Plan: [place]   Discharge Planning Services: CM Consult   Living arrangements for the past 2 months: Apartment                 DME Arranged: Oxygen DME Agency: [employer] Date DME Agency Contacted: 05/04/20 Time DME Agency Contacted: [phone]               Social Determinants of Health (SDOH) Interventions    Readmission Risk Interventions Readmission Risk Prevention Plan 10/24/2017  Post Dischage Appt Complete  Medication Screening Complete  Transportation Screening Complete  PCP follow-up Complete  Some recent data might be hidden

## 2020-05-11 ENCOUNTER — Telehealth: Payer: Self-pay | Admitting: *Deleted

## 2020-05-11 NOTE — Telephone Encounter (Signed)
CALLED PATIENT TO INFORM THAT DR. KINARD WILL BE IN THE OR ON 05-17-20, THEREFORE HIS FU APPT. NEEDS TO BE MOVED , PATIENT OPTED TO CANCEL DUE TO BEING IN A REHAB FACILITY, HE WILL CALL BACK WHEN HE IS READY TO RESCHEDULE THIS FU APPT.

## 2020-05-17 ENCOUNTER — Ambulatory Visit: Payer: Medicare HMO | Admitting: Radiation Oncology

## 2020-05-18 ENCOUNTER — Inpatient Hospital Stay (HOSPITAL_COMMUNITY)
Admission: AD | Admit: 2020-05-18 | Discharge: 2020-05-22 | DRG: 150 | Disposition: A | Discharge status: Home / Self Care | Payer: Medicare HMO | Source: Other Acute Inpatient Hospital | Attending: Internal Medicine | Admitting: Internal Medicine

## 2020-05-18 DIAGNOSIS — Z8616 Personal history of COVID-19: Secondary | ICD-10-CM

## 2020-05-18 DIAGNOSIS — Z7982 Long term (current) use of aspirin: Secondary | ICD-10-CM | POA: Diagnosis not present

## 2020-05-18 DIAGNOSIS — I10 Essential (primary) hypertension: Secondary | ICD-10-CM | POA: Diagnosis present

## 2020-05-18 DIAGNOSIS — Z20822 Contact with and (suspected) exposure to covid-19: Secondary | ICD-10-CM | POA: Diagnosis present

## 2020-05-18 DIAGNOSIS — Z79899 Other long term (current) drug therapy: Secondary | ICD-10-CM

## 2020-05-18 DIAGNOSIS — J189 Pneumonia, unspecified organism: Secondary | ICD-10-CM

## 2020-05-18 DIAGNOSIS — E785 Hyperlipidemia, unspecified: Secondary | ICD-10-CM | POA: Diagnosis present

## 2020-05-18 DIAGNOSIS — J188 Other pneumonia, unspecified organism: Secondary | ICD-10-CM | POA: Diagnosis present

## 2020-05-18 DIAGNOSIS — Z923 Personal history of irradiation: Secondary | ICD-10-CM | POA: Diagnosis not present

## 2020-05-18 DIAGNOSIS — J439 Emphysema, unspecified: Secondary | ICD-10-CM | POA: Diagnosis present

## 2020-05-18 DIAGNOSIS — Z8546 Personal history of malignant neoplasm of prostate: Secondary | ICD-10-CM | POA: Diagnosis not present

## 2020-05-18 DIAGNOSIS — Z85118 Personal history of other malignant neoplasm of bronchus and lung: Secondary | ICD-10-CM | POA: Diagnosis not present

## 2020-05-18 DIAGNOSIS — E876 Hypokalemia: Secondary | ICD-10-CM | POA: Diagnosis present

## 2020-05-18 DIAGNOSIS — R040 Epistaxis: Principal | ICD-10-CM

## 2020-05-18 DIAGNOSIS — Z8701 Personal history of pneumonia (recurrent): Secondary | ICD-10-CM | POA: Diagnosis not present

## 2020-05-18 DIAGNOSIS — J851 Abscess of lung with pneumonia: Secondary | ICD-10-CM | POA: Diagnosis present

## 2020-05-18 DIAGNOSIS — Y95 Nosocomial condition: Secondary | ICD-10-CM | POA: Diagnosis present

## 2020-05-18 DIAGNOSIS — J984 Other disorders of lung: Secondary | ICD-10-CM | POA: Diagnosis not present

## 2020-05-18 DIAGNOSIS — R042 Hemoptysis: Secondary | ICD-10-CM | POA: Diagnosis present

## 2020-05-18 LAB — CBC WITH DIFFERENTIAL/PLATELET
Abs Immature Granulocytes: 0.15 10*3/uL — ABNORMAL HIGH (ref 0.00–0.07)
Basophils Absolute: 0 10*3/uL (ref 0.0–0.1)
Basophils Relative: 0 %
Eosinophils Absolute: 0.2 10*3/uL (ref 0.0–0.5)
Eosinophils Relative: 2 %
HCT: 23.2 % — ABNORMAL LOW (ref 39.0–52.0)
Hemoglobin: 7.7 g/dL — ABNORMAL LOW (ref 13.0–17.0)
Immature Granulocytes: 1 %
Lymphocytes Relative: 7 %
Lymphs Abs: 0.8 10*3/uL (ref 0.7–4.0)
MCH: 28.8 pg (ref 26.0–34.0)
MCHC: 33.2 g/dL (ref 30.0–36.0)
MCV: 86.9 fL (ref 80.0–100.0)
Monocytes Absolute: 0.9 10*3/uL (ref 0.1–1.0)
Monocytes Relative: 7 %
Neutro Abs: 9.6 10*3/uL — ABNORMAL HIGH (ref 1.7–7.7)
Neutrophils Relative %: 83 %
Platelets: 425 10*3/uL — ABNORMAL HIGH (ref 150–400)
RBC: 2.67 MIL/uL — ABNORMAL LOW (ref 4.22–5.81)
RDW: 15.9 % — ABNORMAL HIGH (ref 11.5–15.5)
WBC: 11.7 10*3/uL — ABNORMAL HIGH (ref 4.0–10.5)
nRBC: 0 % (ref 0.0–0.2)

## 2020-05-18 LAB — PROTIME-INR
INR: 1.2 (ref 0.8–1.2)
Prothrombin Time: 15.4 seconds — ABNORMAL HIGH (ref 11.4–15.2)

## 2020-05-18 LAB — RESP PANEL BY RT-PCR (FLU A&B, COVID) ARPGX2
Influenza A by PCR: NEGATIVE
Influenza B by PCR: NEGATIVE
SARS Coronavirus 2 by RT PCR: NEGATIVE

## 2020-05-18 LAB — GLUCOSE, CAPILLARY: Glucose-Capillary: 85 mg/dL (ref 70–99)

## 2020-05-18 LAB — FUNGITELL, SERUM: Fungitell Result: <31 pg/mL (ref <80)

## 2020-05-18 LAB — MRSA PCR SCREENING: MRSA by PCR: NEGATIVE

## 2020-05-18 MED ORDER — ADULT MULTIVITAMIN W/MINERALS CH
1.0000 | ORAL_TABLET | Freq: Every day | ORAL | Status: DC
  Administered 2020-05-18 – 2020-05-22 (×5): 1 via ORAL
  Filled 2020-05-18 (×5): qty 1

## 2020-05-18 MED ORDER — IRBESARTAN 300 MG PO TABS
300.0000 mg | ORAL_TABLET | Freq: Every day | ORAL | Status: DC
  Administered 2020-05-18 – 2020-05-22 (×5): 300 mg via ORAL
  Filled 2020-05-18 (×5): qty 1

## 2020-05-18 MED ORDER — FLORANEX PO PACK
1.0000 g | PACK | Freq: Three times a day (TID) | ORAL | Status: DC
  Administered 2020-05-19 – 2020-05-21 (×8): 1 g via ORAL
  Filled 2020-05-18 (×13): qty 1

## 2020-05-18 MED ORDER — ATORVASTATIN CALCIUM 10 MG PO TABS
20.0000 mg | ORAL_TABLET | Freq: Every day | ORAL | Status: DC
  Administered 2020-05-18 – 2020-05-22 (×5): 20 mg via ORAL
  Filled 2020-05-18 (×5): qty 2

## 2020-05-18 MED ORDER — MAGNESIUM SULFATE 2 GM/50ML IV SOLN
2.0000 g | Freq: Once | INTRAVENOUS | Status: AC
  Administered 2020-05-18: 2 g via INTRAVENOUS
  Filled 2020-05-18: qty 50

## 2020-05-18 MED ORDER — AMOXICILLIN-POT CLAVULANATE 875-125 MG PO TABS
1.0000 | ORAL_TABLET | Freq: Two times a day (BID) | ORAL | Status: DC
  Filled 2020-05-18: qty 1

## 2020-05-18 MED ORDER — DOCUSATE SODIUM 100 MG PO CAPS: 100.0000 mg | ORAL_CAPSULE | Freq: Two times a day (BID) | ORAL | Status: DC | PRN

## 2020-05-18 MED ORDER — PIPERACILLIN-TAZOBACTAM 3.375 G IVPB
3.3750 g | Freq: Three times a day (TID) | INTRAVENOUS | Status: DC
  Administered 2020-05-18 – 2020-05-22 (×12): 3.375 g via INTRAVENOUS
  Filled 2020-05-18 (×9): qty 50

## 2020-05-18 MED ORDER — POTASSIUM CHLORIDE 10 MEQ/100ML IV SOLN
10.0000 meq | INTRAVENOUS | Status: AC
  Administered 2020-05-18 (×3): 10 meq via INTRAVENOUS
  Filled 2020-05-18 (×3): qty 100

## 2020-05-18 MED ORDER — PANTOPRAZOLE SODIUM 40 MG IV SOLR
40.0000 mg | Freq: Two times a day (BID) | INTRAVENOUS | Status: DC
  Administered 2020-05-18 – 2020-05-19 (×3): 40 mg via INTRAVENOUS
  Filled 2020-05-18 (×3): qty 40

## 2020-05-18 MED ORDER — POLYETHYLENE GLYCOL 3350 17 G PO PACK: 17.0000 g | PACK | Freq: Every day | ORAL | Status: DC | PRN

## 2020-05-18 MED ORDER — BACITRACIN ZINC 500 UNIT/GM EX OINT
TOPICAL_OINTMENT | Freq: Two times a day (BID) | CUTANEOUS | Status: DC
  Administered 2020-05-18 – 2020-05-22 (×3): 1 via TOPICAL
  Filled 2020-05-18: qty 28.4

## 2020-05-18 MED ORDER — PANTOPRAZOLE SODIUM 40 MG IV SOLR: 40.0000 mg | Freq: Every day | INTRAVENOUS | Status: DC

## 2020-05-18 NOTE — H&P (Signed)
NAME:  Terry Page., MRN:  [id number], DOB:  03-18-1955, LOS: 0 ADMISSION DATE:  05/18/2020, CONSULTATION DATE:  05/18/20 REFERRING MD:  Dr. Gretta Cool, CHIEF COMPLAINT:  hemoptysis   History of Present Illness:  Terry Page is a 65 y/o male with past medical history of COPD, HTN, prostate cancer, left lung cancer and a recent hospital admission for QJJHE-17 pneumonia complicated by healthcare associated necrotizing pneumonia (discharged 05/10/20) who presented to the the [facility] ED on 5/17 from a SNF with complaints of epistaxis and hemoptysis. He reportedly got a bloody nose this morning at his rehab facility and subsequently bloody vomit vs hemoptysis. He denies shortness of breath, dizziness, chest pain or abdominal pain. He has not noticed any changes in his usual health over the past few days. He reports he has been eating/drinking normally and has not noticed any blood or changes in his bowel movements. He denies any history of a GI bleed. Pt takes a daily aspirin, but no other blood thinners.  In the ED he was slightly hypertensive (155/86) and tachycardic (124). Tachycardia improved after receiving IVFs. While in the ED, he had another episode of coughing vs. vomiting up blood, and he was started on IV protonix to treat possible GI bleed. Patient has pictures from the ED that show a large amount of clot and blood that he coughed up (see below in physical exam for photo). CBC significant for WBC 12.7 and Hgb 9.1. CMP significant for K 3.1. CTA showed moderate bullous emphysema and multifocal consolidation with areas of cavitation within RUL consistent with necrotizing pneumonia for which patient is being treated for already.  Pertinent  Medical History  COPD HTN HLD H/O Covid pneumonia complicated by healthcare associated and necrotizing pneumonia (hospitalized 04/10/20 - 05/10/20 and discharged to [facility]); treated with augmentin end date 5/28 Bullous  emphysema Prostate cancer Left upper lobe lung cancer, initially managed with radiation  Significant Hospital Events: Including procedures, antibiotic start and stop dates in addition to other pertinent events   . 5/17: Arrived at [facility] ED. Transferred to [facility] ICU  Interim History / Subjective:  As above. Patient does not complain of any abdominal pain or active bleeding currently. VSS.  Objective   Blood pressure (!) 139/96, pulse (!) 102, temperature 98.2 F (36.8 C), temperature source Axillary, resp. rate (!) 23, SpO2 98 %.        Intake/Output Summary (Last 24 hours) at 05/18/2020 1755 Last data filed at 05/18/2020 1731 Gross per 24 hour  Intake --  Output 600 ml  Net -600 ml   There were no vitals filed for this visit.  Examination: General: Alert. Lying in bed. No acute distress. HENT: No signs of active bleeding in the nose or mouth. Mucus membranes moist. Lungs: Equal chest wall expansion. Low-pitched upper airway noises transmitted throughout. Regular respiratory rate. Cardiovascular: Regular rate and rhythm. No murmurs, rubs, gallops. GI/Abdomen: Distended. Soft. Nontender. Black stool. Extremities: Warm. Dry. No lower extremity edema Neuro: Alert and oriented x 3.       Labs/imaging that I havepersonally reviewed  (right click and "Reselect all SmartList Selections" daily)  Hgb 9.1 WBC 12.7 K 3.1 Mg 1.4  Resolved Hospital Problem list     Assessment & Plan:  #Hemoptysis vs. Epistaxis vs. Hematemesis: Difficult to determine etiology of bleeding. Based on pt's history it is most likely hemoptysis vs epistaxis. Hematemesis from GI bleed less likely given lack of abdominal pain, nausea or black/bloody  BM prior to today. Most recent Hgb 9.1 which is consistent with previous. Pt's photo shows significant amount of blood which raises concern for airway patency and need to localize where bleed is coming from. The potentially most serious etiology given his  history of bullous emphysema and is that he is bleeding into the bullae and then coughing up blood. P: - Continue to monitor pt's vitals and status. If he has another episode he may require intubation and bronchoscopy to protect the airway and localize bleeding - Discussed with ENT - recommended applying bacitracin ointment inside the nose twice a day - Pantoprazole 40mg  BID for possible GI bleed - Repeat CBC, BMP - PT/INR - Clear liquids until midnight, then NPO in case needs sedation/intubation + bronchoscopy  #History of Covid pneumonia, Hospital-Acquired Pneumonia, Necrotizing Pneumonia: Treated for these conditions in previous admission (4/9-5/9). CTA and CXR show RUL consolidation consistent with necrotizing pneumonia. Currently on augmentin with stop date of 5/28. - Continue augmentin Q12  #Hypokalemia  Hypomag : K 3.1, Mg 1.4 P: -repleting  #Hyperlipidemia - atorvastatin 20mg  QD  #HTN - irbesartan 300mg  QD  #COPD - cont home duonebs as needed     Best practice (right click and "Reselect all SmartList Selections" daily)  Diet:  NPO Pain/Anxiety/Delirium protocol (if indicated): No VAP protocol (if indicated): Not indicated DVT prophylaxis: SCD GI prophylaxis: PPI Glucose control:  SSI No Central venous access:  N/A Arterial line:  N/A Foley:  N/A Mobility:  OOB  PT consulted: N/A Last date of multidisciplinary goals of care discussion [n/a] Code Status:  full code Disposition: ICU  Labs   CBC: No results for input(s): WBC, NEUTROABS, HGB, HCT, MCV, PLT in the last 168 hours.  Basic Metabolic Panel: No results for input(s): NA, K, CL, CO2, GLUCOSE, BUN, CREATININE, CALCIUM, MG, PHOS in the last 168 hours. GFR: CrCl cannot be calculated (Unknown ideal weight.). No results for input(s): PROCALCITON, WBC, LATICACIDVEN in the last 168 hours.  Liver Function Tests: No results for input(s): AST, ALT, ALKPHOS, BILITOT, PROT, ALBUMIN in the last 168 hours. No  results for input(s): LIPASE, AMYLASE in the last 168 hours. No results for input(s): AMMONIA in the last 168 hours.  ABG    Component Value Date/Time   HCO3 25.5 04/11/2020 0202   TCO2 26 06/01/2019 1232   O2SAT 55.8 04/11/2020 0202     Coagulation Profile: No results for input(s): INR, PROTIME in the last 168 hours.  Cardiac Enzymes: No results for input(s): CKTOTAL, CKMB, CKMBINDEX, TROPONINI in the last 168 hours.  HbA1C: No results found for: HGBA1C  CBG: Recent Labs  Lab 05/18/20 1620  GLUCAP 85    Review of Systems:   As above  Past Medical History:  He,  has a past medical history of Alcohol abuse, Asthma, Bullous emphysema ([place]), Essential hypertension (04/10/2020), Hemorrhoids, History of radiation therapy (04/08/20-04/19/20), Incidental lung nodule, greater than or equal to 64mm (10/22/2017), Prostate cancer ([place]), Spontaneous pneumothorax (10/20/2017), and Tobacco abuse.   Surgical History:   Past Surgical History:  Procedure Laterality Date  . BACK SURGERY    . PROSTATE BIOPSY       Social History:   reports that he quit smoking about 5 months ago. His smoking use included cigarettes. He has never used smokeless tobacco. He reports current alcohol use of about 12.0 standard drinks of alcohol per week. He reports that he does not use drugs.   Family History:  His family history includes Cancer in his  cousin, cousin, and cousin. There is no history of Colon polyps, Pancreatic disease, Pancreatic cancer, Breast cancer, or Colon cancer.   Allergies No Known Allergies   Home Medications  Prior to Admission medications   Medication Sig Start Date End Date Taking? Authorizing Provider  acetaminophen (TYLENOL) 325 MG tablet Take 2 tablets (650 mg total) by mouth every 6 (six) hours as needed for mild pain or headache (fever >/= 101). 05/10/20   Arrien, Jimmy Picket, MD  albuterol (VENTOLIN HFA) 108 (90 Base) MCG/ACT inhaler Inhale 2 puffs into the lungs every  6 (six) hours as needed for wheezing or shortness of breath. Patient not taking: No sig reported 06/06/19 07/06/19  Elodia Florence., MD  alum & mag hydroxide-simeth (MAALOX/MYLANTA) 200-200-20 MG/5ML suspension Take 15 mLs by mouth every 6 (six) hours as needed for indigestion or heartburn. 05/10/20   Arrien, Jimmy Picket, MD  amoxicillin-clavulanate (AUGMENTIN) 875-125 MG tablet Take 1 tablet by mouth every 12 (twelve) hours for 19 days. 05/10/20 05/29/20  Arrien, Jimmy Picket, MD  aspirin EC 81 MG tablet Take 81 mg by mouth daily.    [provider]  atorvastatin (LIPITOR) 20 MG tablet Take 20 mg by mouth daily. 01/28/19   [provider]  diphenoxylate-atropine (LOMOTIL) 2.5-0.025 MG tablet Take 1 tablet by mouth 4 (four) times daily as needed for diarrhea or loose stools. 05/10/20   Arrien, Jimmy Picket, MD  feeding supplement (ENSURE ENLIVE / ENSURE PLUS) LIQD Take 237 mLs by mouth daily. 05/10/20 06/09/20  Arrien, Jimmy Picket, MD  ipratropium-albuterol (DUONEB) 0.5-2.5 (3) MG/3ML SOLN Take 3 mLs by nebulization every 6 (six) hours as needed. 05/10/20   Arrien, Jimmy Picket, MD  lactobacillus (FLORANEX/LACTINEX) PACK Take 1 packet (1 g total) by mouth 3 (three) times daily with meals. 05/10/20 06/09/20  Arrien, Jimmy Picket, MD  lipase/protease/amylase (CREON) 12000-38000 units CPEP capsule Take 1 capsule (12,000 Units total) by mouth 3 (three) times daily before meals. 05/10/20 06/09/20  Arrien, Jimmy Picket, MD  Multiple Vitamin (MULTIVITAMIN WITH MINERALS) TABS tablet Take 1 tablet by mouth daily. 05/11/20 06/10/20  Arrien, Jimmy Picket, MD  naproxen sodium (ALEVE) 220 MG tablet Take 220 mg by mouth daily as needed (pain).    [provider]  olmesartan (BENICAR) 40 MG tablet Take 40 mg by mouth daily. 11/16/17   [provider]  polyethylene glycol (MIRALAX / GLYCOLAX) 17 g packet Take 17 g by mouth 2 (two) times daily. 05/10/20   Arrien, Jimmy Picket,  MD     Letitia Neri, MS4

## 2020-05-18 NOTE — Progress Notes (Signed)
Pharmacy Antibiotic Note  Terry Page. is a 65 y.o. male with recent admission (4/6-5/5) for COVID pneumonia complicated by healthcare-associated necrotizing pneumonia. admitted on 05/18/2020. Pt is currently on Augmentin 875 mg PO Q 12 hrs, with planned stop date of 5/28.  Pharmacy has been consulted for Zosyn dosing for HCAP.  WBC 9.9, afebrile; Scr 0.37, CrCl >> 100 ml/min   Plan: Zosyn 3.375 gm IV Q 8 hrs (extended infusion) Monitor WBC, temp, clinical improvement, renal function  Height: 69 inches Weight: 69.3 kg on 05/01/20  Temp (24hrs), Avg:98.2 F (36.8 C), Min:98.2 F (36.8 C), Max:98.2 F (36.8 C)  No results for input(s): WBC, CREATININE, LATICACIDVEN, VANCOTROUGH, VANCOPEAK, VANCORANDOM, GENTTROUGH, GENTPEAK, GENTRANDOM, TOBRATROUGH, TOBRAPEAK, TOBRARND, AMIKACINPEAK, AMIKACINTROU, AMIKACIN in the last 168 hours.  CrCl cannot be calculated (Unknown ideal weight.).    No Known Allergies  Antimicrobials this admission: Augmentin 5/17 Zosyn 5/17 >>  Microbiology results: No cultures during this admission thus far  Thank you for allowing pharmacy to be a part of this patient's care.  Gillermina Hu, PharmD, BCPS, [facility] Clinical Pharmacist 05/18/2020 6:03 PM

## 2020-05-18 NOTE — Progress Notes (Signed)
Terry Page [id number] Admission Data: 05/18/2020 6:02 PM Attending Provider: Freddi Starr, MD  MLJ:[id number], Apolonio Schneiders, MD Consults/ Treatment Team:   [name]. is a 65 y.o. male patient admitted from ED awake, alert  & orientated  X 3,  Full Code, VSS - Blood pressure (!) 139/96, pulse (!) 102, temperature 98.2 F (36.8 C), temperature source Axillary, resp. rate (!) 23, SpO2 98 %. no c/o shortness of breath, no c/o chest pain, no distress noted. Tele # [phone]  placed and pt is currently running:sinus tachycardia.   IV site WDL:  forearm right, condition patent and no redness and left, condition patent and no redness with a transparent dsg that's clean dry and intact.  Pt orientation to unit, room and routine. Information packet given to patient/family and safety video watched.  Admission INP armband ID verified with patient/family, and in place. SR up x 2, fall risk assessment complete with Patient and family verbalizing understanding of risks associated with falls. Pt verbalizes an understanding of how to use the call bell and to call for help before getting out of bed.  Skin, clean-dry- intact without evidence of bruising, or skin tears.   No evidence of skin break down noted on exam.   Will cont to monitor and assist as needed.  Hosie Spangle, [place] 05/18/2020 6:02 PM

## 2020-05-19 ENCOUNTER — Inpatient Hospital Stay: Payer: Medicare HMO | Admitting: Adult Health

## 2020-05-19 ENCOUNTER — Other Ambulatory Visit: Payer: Self-pay | Admitting: *Deleted

## 2020-05-19 DIAGNOSIS — R042 Hemoptysis: Secondary | ICD-10-CM

## 2020-05-19 LAB — HEMOGLOBIN AND HEMATOCRIT, BLOOD
HCT: 25.5 % — ABNORMAL LOW (ref 39.0–52.0)
Hemoglobin: 8.2 g/dL — ABNORMAL LOW (ref 13.0–17.0)

## 2020-05-19 LAB — CBC
HCT: 23.1 % — ABNORMAL LOW (ref 39.0–52.0)
Hemoglobin: 7.5 g/dL — ABNORMAL LOW (ref 13.0–17.0)
MCH: 28.1 pg (ref 26.0–34.0)
MCHC: 32.5 g/dL (ref 30.0–36.0)
MCV: 86.5 fL (ref 80.0–100.0)
Platelets: 403 10*3/uL — ABNORMAL HIGH (ref 150–400)
RBC: 2.67 MIL/uL — ABNORMAL LOW (ref 4.22–5.81)
RDW: 15.8 % — ABNORMAL HIGH (ref 11.5–15.5)
WBC: 11.4 10*3/uL — ABNORMAL HIGH (ref 4.0–10.5)
nRBC: 0 % (ref 0.0–0.2)

## 2020-05-19 LAB — BASIC METABOLIC PANEL
Anion gap: 8 (ref 5–15)
BUN: <5 mg/dL — ABNORMAL LOW (ref 8–23)
CO2: 25 mmol/L (ref 22–32)
Calcium: 8.5 mg/dL — ABNORMAL LOW (ref 8.9–10.3)
Chloride: 99 mmol/L (ref 98–111)
Creatinine, Ser: 0.53 mg/dL — ABNORMAL LOW (ref 0.61–1.24)
GFR, Estimated: >60 mL/min (ref >60)
Glucose, Bld: 100 mg/dL — ABNORMAL HIGH (ref 70–99)
Potassium: 3.1 mmol/L — ABNORMAL LOW (ref 3.5–5.1)
Sodium: 132 mmol/L — ABNORMAL LOW (ref 135–145)

## 2020-05-19 LAB — PHOSPHORUS: Phosphorus: 3.7 mg/dL (ref 2.5–4.6)

## 2020-05-19 LAB — MAGNESIUM: Magnesium: 1.6 mg/dL — ABNORMAL LOW (ref 1.7–2.4)

## 2020-05-19 LAB — GLUCOSE, CAPILLARY: Glucose-Capillary: 96 mg/dL (ref 70–99)

## 2020-05-19 MED ORDER — POTASSIUM CHLORIDE CRYS ER 20 MEQ PO TBCR
20.0000 meq | EXTENDED_RELEASE_TABLET | Freq: Once | ORAL | Status: AC
  Administered 2020-05-19: 20 meq via ORAL
  Filled 2020-05-19: qty 1

## 2020-05-19 MED ORDER — CHLORHEXIDINE GLUCONATE CLOTH 2 % EX PADS
6.0000 | MEDICATED_PAD | Freq: Every day | CUTANEOUS | Status: DC
  Administered 2020-05-19 (×2): 6 via TOPICAL

## 2020-05-19 MED ORDER — POTASSIUM CHLORIDE CRYS ER 20 MEQ PO TBCR
40.0000 meq | EXTENDED_RELEASE_TABLET | Freq: Once | ORAL | Status: AC
  Administered 2020-05-19: 40 meq via ORAL
  Filled 2020-05-19: qty 2

## 2020-05-19 NOTE — Progress Notes (Signed)
NAME:  Terry Page., MRN:  [id number], DOB:  Mar 18, 1955, LOS: 1 ADMISSION DATE:  05/18/2020, CONSULTATION DATE:  05/18/20 REFERRING MD:  Dr. Gretta Cool, CHIEF COMPLAINT:  hemoptysis   History of Present Illness:  Terry Page is a 65 y/o male with past medical history of COPD, HTN, prostate cancer, left lung cancer and a recent hospital admission for WUJWJ-19 pneumonia complicated by healthcare associated necrotizing pneumonia (discharged 05/10/20) who presented to the the [facility] ED on 5/17 from a SNF with complaints of epistaxis and hemoptysis. He reportedly got a bloody nose this morning at his rehab facility and subsequently bloody vomit vs hemoptysis. He denies shortness of breath, dizziness, chest pain or abdominal pain. He has not noticed any changes in his usual health over the past few days. He reports he has been eating/drinking normally and has not noticed any blood or changes in his bowel movements. He denies any history of a GI bleed. Pt takes a daily aspirin, but no other blood thinners.  In the ED he was slightly hypertensive (155/86) and tachycardic (124). Tachycardia improved after receiving IVFs. While in the ED, he had another episode of coughing vs. vomiting up blood, and he was started on IV protonix to treat possible GI bleed. Patient has pictures from the ED that show a large amount of clot and blood that he coughed up (see below in physical exam for photo). CBC significant for WBC 12.7 and Hgb 9.1. CMP significant for K 3.1. CTA showed moderate bullous emphysema and multifocal consolidation with areas of cavitation within RUL consistent with necrotizing pneumonia for which patient is being treated for already.  Pertinent  Medical History  COPD HTN HLD H/O Covid pneumonia complicated by healthcare associated and necrotizing pneumonia (hospitalized 04/10/20 - 05/10/20 and discharged to [facility]); treated with augmentin end date 5/28 Bullous  emphysema Prostate cancer Left upper lobe lung cancer, initially managed with radiation  Significant Hospital Events: Including procedures, antibiotic start and stop dates in addition to other pertinent events   . 5/17: Arrived at [facility] ED. Transferred to [facility] ICU  Interim History / Subjective:  No acute events overnight. VSS. Continues to have wet cough, but has not coughed up any blood or sputum. Feels bloated. Denies SOB, CP, dizziness or abd pain.  Objective   Blood pressure (!) 118/94, pulse (!) 101, temperature 98.4 F (36.9 C), temperature source Oral, resp. rate (!) 23, weight 64.5 kg, SpO2 97 %.        Intake/Output Summary (Last 24 hours) at 05/19/2020 0857 Last data filed at 05/19/2020 0350 Gross per 24 hour  Intake 354.7 ml  Output 1425 ml  Net -1070.3 ml   Filed Weights   05/19/20 0454  Weight: 64.5 kg    Examination: General: Alert. No acute distress. Lying in bed. HENT: Sclera anicteric. Moist mucus membranes. No signs of active bleeding in mouth or nose. Lungs: CTAB. Equal chest wall expansion. Regular respiratory rate. Cardiovascular: Regular rate and rhythm. No murmurs, rubs, gallops. GI/Abdomen: Distended. Soft. Nontender. Extremities: Warm, dry. No lower extremity edema. Neuro: A&Ox3   Labs/imaging that I havepersonally reviewed  (right click and "Reselect all SmartList Selections" daily)  Hgb 7.5 (9.1, 5/17) WBC 11.4 K 3.1 PT 15.4, INR 1.2  Resolved Hospital Problem list     Assessment & Plan:  #Hemoptysis vs. Epistaxis vs. Hematemesis: Difficult to determine etiology of bleeding. Based on pt's history it is most likely hemoptysis vs epistaxis. Hematemesis from GI  bleed less likely given lack of abdominal pain, nausea or black/bloody BM prior to yesterday. No episodes of hemoptysis last night. Hgb this morning was 7.5, down from yesterday (9.1). PT 15.4, INR 1.2. Pt is asx. The most serious potential etiology given his history of bullous  emphysema and is that he is bleeding into the bullae and then coughing up blood; however, he has remained stable since admission. P:  - Continue to monitor pt's vitals and status. If he has another episode he may require intubation and bronchoscopy to protect the airway and localize bleeding - Discussed with ENT - recommended applying bacitracin ointment inside the nose twice a day - Pantoprazole 40mg  BID for possible GI bleed - Repeat H/H this afternoon - Can now have regular diet  #History of Covid pneumonia, Hospital-Acquired Pneumonia, Necrotizing Pneumonia: Treated for these conditions in previous admission (4/9-5/9). CTA and CXR show RUL consolidation consistent with necrotizing pneumonia. Was prescribed augmentin with stop date of 5/28. Switched to Zosyn 5/17 due to concern for HCAP. - Continue Zosyn   #Hypokalemia  Hypomag : K still low (3.1) this AM after K 52meq x 3 last night.  P: - check Mg, phos - replete   #Hyperlipidemia - atorvastatin 20mg  QD  #HTN - irbesartan 300mg  QD  #COPD - cont home duonebs as needed    Best practice (right click and "Reselect all SmartList Selections" daily)  Diet:  Oral Pain/Anxiety/Delirium protocol (if indicated): No VAP protocol (if indicated): Not indicated DVT prophylaxis: SCD GI prophylaxis: PPI Glucose control:  SSI No Central venous access:  N/A Arterial line:  N/A Foley:  N/A Mobility:  OOB  PT consulted: N/A Last date of multidisciplinary goals of care discussion [n/a] Code Status:  full code Disposition: ICU  Letitia Neri, MS4

## 2020-05-20 ENCOUNTER — Inpatient Hospital Stay (HOSPITAL_COMMUNITY): Payer: Medicare HMO

## 2020-05-20 ENCOUNTER — Telehealth: Payer: Self-pay

## 2020-05-20 ENCOUNTER — Ambulatory Visit: Payer: Medicare HMO | Admitting: Radiation Oncology

## 2020-05-20 DIAGNOSIS — R042 Hemoptysis: Secondary | ICD-10-CM | POA: Diagnosis not present

## 2020-05-20 LAB — HEMOGLOBIN AND HEMATOCRIT, BLOOD
HCT: 22.6 % — ABNORMAL LOW (ref 39.0–52.0)
Hemoglobin: 7.5 g/dL — ABNORMAL LOW (ref 13.0–17.0)

## 2020-05-20 LAB — BASIC METABOLIC PANEL
Anion gap: 7 (ref 5–15)
BUN: <5 mg/dL — ABNORMAL LOW (ref 8–23)
CO2: 25 mmol/L (ref 22–32)
Calcium: 8.5 mg/dL — ABNORMAL LOW (ref 8.9–10.3)
Chloride: 101 mmol/L (ref 98–111)
Creatinine, Ser: 0.69 mg/dL (ref 0.61–1.24)
GFR, Estimated: >60 mL/min (ref >60)
Glucose, Bld: 104 mg/dL — ABNORMAL HIGH (ref 70–99)
Potassium: 3.8 mmol/L (ref 3.5–5.1)
Sodium: 133 mmol/L — ABNORMAL LOW (ref 135–145)

## 2020-05-20 LAB — CBC
HCT: 22 % — ABNORMAL LOW (ref 39.0–52.0)
Hemoglobin: 7.3 g/dL — ABNORMAL LOW (ref 13.0–17.0)
MCH: 28.5 pg (ref 26.0–34.0)
MCHC: 33.2 g/dL (ref 30.0–36.0)
MCV: 85.9 fL (ref 80.0–100.0)
Platelets: 436 10*3/uL — ABNORMAL HIGH (ref 150–400)
RBC: 2.56 MIL/uL — ABNORMAL LOW (ref 4.22–5.81)
RDW: 15.7 % — ABNORMAL HIGH (ref 11.5–15.5)
WBC: 10.5 10*3/uL (ref 4.0–10.5)
nRBC: 0 % (ref 0.0–0.2)

## 2020-05-20 LAB — MAGNESIUM: Magnesium: 1.4 mg/dL — ABNORMAL LOW (ref 1.7–2.4)

## 2020-05-20 MED ORDER — PANTOPRAZOLE SODIUM 40 MG PO TBEC
40.0000 mg | DELAYED_RELEASE_TABLET | Freq: Two times a day (BID) | ORAL | Status: DC
  Administered 2020-05-20 – 2020-05-22 (×5): 40 mg via ORAL
  Filled 2020-05-20 (×5): qty 1

## 2020-05-20 MED ORDER — DM-GUAIFENESIN ER 30-600 MG PO TB12
1.0000 | ORAL_TABLET | Freq: Two times a day (BID) | ORAL | Status: DC
  Administered 2020-05-20 – 2020-05-22 (×5): 1 via ORAL
  Filled 2020-05-20 (×6): qty 1

## 2020-05-20 MED ORDER — MAGNESIUM SULFATE 4 GM/100ML IV SOLN
4.0000 g | Freq: Once | INTRAVENOUS | Status: AC
  Administered 2020-05-20: 4 g via INTRAVENOUS
  Filled 2020-05-20: qty 100

## 2020-05-20 MED ORDER — AMOXICILLIN-POT CLAVULANATE 875-125 MG PO TABS: 1.0000 | ORAL_TABLET | Freq: Two times a day (BID) | ORAL | Status: DC

## 2020-05-20 MED ORDER — ACETAMINOPHEN 325 MG PO TABS
650.0000 mg | ORAL_TABLET | Freq: Four times a day (QID) | ORAL | Status: DC | PRN
  Administered 2020-05-20 – 2020-05-22 (×4): 650 mg via ORAL
  Filled 2020-05-20 (×4): qty 2

## 2020-05-20 MED ORDER — MAGNESIUM SULFATE 2 GM/50ML IV SOLN: 2.0000 g | Freq: Once | INTRAVENOUS | Status: DC

## 2020-05-20 MED ORDER — GUAIFENESIN-DM 100-10 MG/5ML PO SYRP: 5.0000 mL | ORAL_SOLUTION | Freq: Three times a day (TID) | ORAL | Status: DC | PRN

## 2020-05-20 NOTE — Telephone Encounter (Signed)
Patient has been scheduled with Dr. Valeta Harms for follow up. CT scan order was placed. Nothing further needed at this time.   Next Appt With Pulmonology ([place], DO) 06/03/2020 at 2:00 PM

## 2020-05-20 NOTE — Progress Notes (Signed)
NAME:  [name], MRN:  [id number], DOB:  1955-08-28, LOS: 2 ADMISSION DATE:  05/18/2020, CONSULTATION DATE:  05/18/20 REFERRING MD:  Dr. Gretta Cool, CHIEF COMPLAINT:  hemoptysis   History of Present Illness:  Terry Page is a 65 y/o male with past medical history of COPD, HTN, prostate cancer, left lung cancer and a recent hospital admission for IRJJO-84 pneumonia complicated by healthcare associated necrotizing pneumonia (discharged 05/10/20) who presented to the the [facility] ED on 5/17 from a SNF with complaints of epistaxis and hemoptysis. He reportedly got a bloody nose this morning at his rehab facility and subsequently bloody vomit vs hemoptysis. He denies shortness of breath, dizziness, chest pain or abdominal pain. He has not noticed any changes in his usual health over the past few days. He reports he has been eating/drinking normally and has not noticed any blood or changes in his bowel movements. He denies any history of a GI bleed. Pt takes a daily aspirin, but no other blood thinners.  In the ED he was slightly hypertensive (155/86) and tachycardic (124). Tachycardia improved after receiving IVFs. While in the ED, he had another episode of coughing vs. vomiting up blood, and he was started on IV protonix to treat possible GI bleed. Patient has pictures from the ED that show a large amount of clot and blood that he coughed up (see below in physical exam for photo). CBC significant for WBC 12.7 and Hgb 9.1. CMP significant for K 3.1. CTA showed moderate bullous emphysema and multifocal consolidation with areas of cavitation within RUL consistent with necrotizing pneumonia for which patient is being treated for already.  Pertinent  Medical History  COPD HTN HLD H/O Covid pneumonia complicated by healthcare associated and necrotizing pneumonia (hospitalized 04/10/20 - 05/10/20 and discharged to [facility]); treated with augmentin end date 5/28 Bullous  emphysema Prostate cancer Left upper lobe lung cancer, initially managed with radiation  Significant Hospital Events: Including procedures, antibiotic start and stop dates in addition to other pertinent events   . 5/17: Arrived at [facility] ED. Transferred to [facility] ICU  Interim History / Subjective:  Patient reports dry hacking cough. Was able to rest some overnight. Denies sputum production.   Objective   Blood pressure 107/73, pulse (!) 111, temperature 99.9 F (37.7 C), temperature source Axillary, resp. rate (!) 23, weight 65 kg, SpO2 94 %.        Intake/Output Summary (Last 24 hours) at 05/20/2020 0828 Last data filed at 05/20/2020 0800 Gross per 24 hour  Intake 340.57 ml  Output 1290 ml  Net -949.43 ml   Filed Weights   05/19/20 0454 05/20/20 0500  Weight: 64.5 kg 65 kg    Examination: General: siting in bed, no distress  HENT: MMM Lungs: Coarse breath sounds, no wheeze, no use of accessory muscles  Cardiovascular: Tachy, HR 112, no MRG GI/Abdomen: Distended. Soft. Nontender. Active bowel sounds  Extremities: Warm, dry. -edema  Neuro: Alert, oriented, follows commands    Labs/imaging that I havepersonally reviewed  (right click and "Reselect all SmartList Selections" daily)  Hgb 7.3 (8.2, 5/18) WBC 10.5 K 3.8  Resolved Hospital Problem list     Assessment & Plan:   #Hemoptysis vs. Epistaxis vs. Hematemesis: Difficult to determine etiology of bleeding. Based on pt's history it is most likely hemoptysis vs epistaxis. Hematemesis from GI bleed less likely given lack of abdominal pain, nausea or black/bloody BM prior to yesterday. The most serious potential etiology given  his history of bullous emphysema and is that he is bleeding into the bullae and then coughing up blood; however, he has remained stable since admission. P:  - Discussed with ENT - recommended applying bacitracin ointment inside the nose twice a day - Pantoprazole 40mg  BID for possible GI bleed -  Trend Hemoglobin   #History of Covid pneumonia, Hospital-Acquired Pneumonia, Necrotizing Pneumonia: Treated for these conditions in previous admission (4/9-5/9). CTA and CXR show RUL consolidation consistent with necrotizing pneumonia. Was prescribed augmentin with stop date of 5/28. Switched to [place] 5/17 due to concern for HCAP. - Continue Zosyn (5 day stop date placed). Add back Augmentin and continue with stop date 5/30   #Hypokalemia  Hypomag  P: - Replacing mag this AM  - Trend BMP   #Hyperlipidemia - atorvastatin 20mg  QD  #HTN - irbesartan 300mg  QD  #COPD - cont home duonebs as needed    Best practice (right click and "Reselect all SmartList Selections" daily)  Diet:  Oral DVT prophylaxis: SCD GI prophylaxis: PPI Glucose control:  SSI No Mobility:  OOB  Code Status:  full code Disposition: PCCM to sign off at this time. Augmentin as above. Patient states he has a pulmonologist, would ensure follow up appointment is made prior to discharge

## 2020-05-20 NOTE — Telephone Encounter (Signed)
-----   Message from Lanier Clam, MD sent at 05/20/2020 10:38 AM EDT ----- Regarding: f/u appt Hello - can this man be seen at the end of the month 30 min hosp f/u for cavitary pneumonia/ abscess with more recent hemoptysis? Needs a CT w/o contrast prior to visit - this is ordered. Will help decide duration fo antibiotics. Can see me or APP or other doc - whoever is available.  Thanks! Matt

## 2020-05-20 NOTE — Progress Notes (Signed)
PROGRESS NOTE    Terry Page.  KWI:[id number] DOB: 10-02-1955 DOA: 05/18/2020 PCP: [name], MD   Brief Narrative: This 65 years old male with PMH significant for COPD, HTN, prostate cancer, left lung cancer and a recent hospitalization for COVID-pneumonia complicated by healthcare associated necrotizing pneumonia ( he was discharged on 05/10/2020) presented to the  ED from nursing home with complaints of epistaxis and hemoptysis.  He reports nosebleeds in the morning at the rehab facility and subsequently had a bloody vomitus versus hemoptysis.  He denies any shortness of breath, dizziness, chest pain or abdominal pain.  In the ED he had another episode of coughing versus vomiting of blood.  He was started on IV Protonix to treat possible GI bleed.  CTA chest shows moderate bullous emphysema and multifocal consolidation with areas of cavitation within the right upper lobe consistent with necrotizing pneumonia.  Assessment & Plan:   Active Problems:   Hemoptysis  Hemoptysis Vs Epistaxis Vs Hematemesis: It is difficult to determine etiology of bleeding.   Most likely patient has a nosebleed followed by coughing up blood. Hematemesis from GI bleed less likely given lack of abdominal pain, nausea, black or bloody bowel movement prior to today. Patient was admitted in ICU initially with a concern, He may require intubation for airway protection.  His hemoglobin remained stable, 7.7> 7.5>8.2>7.3>7.5 Case was discussed with ENT, recommended applying bacitracin ointment inside the nose twice daily.  Continue Protonix 40 mg p.o. twice daily for possible bleed,  started on clear liquid diet. Repeat CTA chest showed increasing fluid filled bulla of the left upper lobe when compared to the CT chest a week ago. Patient is on room air and normotensive without complaints. Antibiotics changed from Augmentin to Zosyn.  Hypertension: Continue irbesartan  COPD: Continue home  medications.  Hyperlipidemia : Continue atorvastatin  History of COVID PNA/  Hospital acquired necrotizing pneumonia: He was treated in the previous admission, prolonged Augmentin course.  CTA chest shows right upper lobe consolidation consistent with necrotizing pneumonia.  Patient is currently on Augmentin.  Antibiotic changed to Zosyn.   DVT prophylaxis: SCDs Code Status: Full code. Family Communication: No family at bed side. Disposition Plan:   Status is: Inpatient  Remains inpatient appropriate because:Inpatient level of care appropriate due to severity of illness   Dispo: The patient is from: Home              Anticipated d/c is to: Home              Patient currently is not medically stable to d/c.   Difficult to place patient No   Consultants:   PCCM  Procedures:  Antimicrobials:  Anti-infectives (From admission, onward)   Start     Dose/Rate Route Frequency Ordered Stop   05/23/20 2200  amoxicillin-clavulanate (AUGMENTIN) 875-125 MG per tablet 1 tablet        1 tablet Oral Every 12 hours 05/20/20 0906 06/01/20 0959   05/18/20 2200  amoxicillin-clavulanate (AUGMENTIN) 875-125 MG per tablet 1 tablet  Status:  Discontinued        1 tablet Oral Every 12 hours 05/18/20 1652 05/18/20 1756   05/18/20 1830  piperacillin-tazobactam (ZOSYN) IVPB 3.375 g        3.375 g 12.5 mL/hr over 240 Minutes Intravenous Every 8 hours 05/18/20 1815 05/23/20 1829      Subjective: Patient was seen and examined at bedside.  He feels better,  denies any further bleeding.   He denies any  shortness of breath.  Reports having nosebleed followed by coughing of blood.  Objective: Vitals:   05/20/20 0800 05/20/20 0900 05/20/20 1000 05/20/20 1100  BP: 107/73 112/84 122/88 140/87  Pulse: (!) 111 (!) 110 100 86  Resp: (!) 23 (!) 23    Temp:      TempSrc:      SpO2: 94% 95% 95% 96%  Weight:        Intake/Output Summary (Last 24 hours) at 05/20/2020 1411 Last data filed at 05/20/2020  1000 Gross per 24 hour  Intake 229.97 ml  Output 1615 ml  Net -1385.03 ml   Filed Weights   05/19/20 0454 05/20/20 0500  Weight: 64.5 kg 65 kg    Examination:  General exam: Appears calm and comfortable, not in any acute distress. Respiratory system: Clear to auscultation. Respiratory effort normal. Cardiovascular system: S1 & S2 heard, RRR. No JVD, murmurs, rubs, gallops or clicks. No pedal edema. Gastrointestinal system: Abdomen is nondistended, soft and nontender. No organomegaly or masses felt.  Normal bowel sounds heard. Central nervous system: Alert and oriented. No focal neurological deficits. Extremities: Symmetric 5 x 5 power. Skin: No rashes, lesions or ulcers Psychiatry: Judgement and insight appear normal. Mood & affect appropriate.     Data Reviewed: I have personally reviewed following labs and imaging studies  CBC: Recent Labs  Lab 05/18/20 1746 05/19/20 0047 05/19/20 1521 05/20/20 0205 05/20/20 0932  WBC 11.7* 11.4*  --  10.5  --   NEUTROABS 9.6*  --   --   --   --   HGB 7.7* 7.5* 8.2* 7.3* 7.5*  HCT 23.2* 23.1* 25.5* 22.0* 22.6*  MCV 86.9 86.5  --  85.9  --   PLT 425* 403*  --  436*  --    Basic Metabolic Panel: Recent Labs  Lab 05/19/20 0047 05/19/20 1521 05/20/20 0205  NA 132*  --  133*  K 3.1*  --  3.8  CL 99  --  101  CO2 25  --  25  GLUCOSE 100*  --  104*  BUN <5*  --  <5*  CREATININE 0.53*  --  0.69  CALCIUM 8.5*  --  8.5*  MG  --  1.6* 1.4*  PHOS  --  3.7  --    GFR: Estimated Creatinine Clearance: 83.5 mL/min (by C-G formula based on SCr of 0.69 mg/dL). Liver Function Tests: No results for input(s): AST, ALT, ALKPHOS, BILITOT, PROT, ALBUMIN in the last 168 hours. No results for input(s): LIPASE, AMYLASE in the last 168 hours. No results for input(s): AMMONIA in the last 168 hours. Coagulation Profile: Recent Labs  Lab 05/18/20 1746  INR 1.2   Cardiac Enzymes: No results for input(s): CKTOTAL, CKMB, CKMBINDEX, TROPONINI  in the last 168 hours. BNP (last 3 results) No results for input(s): PROBNP in the last 8760 hours. HbA1C: No results for input(s): HGBA1C in the last 72 hours. CBG: Recent Labs  Lab 05/18/20 1620 05/19/20 0738  GLUCAP 85 96   Lipid Profile: No results for input(s): CHOL, HDL, LDLCALC, TRIG, CHOLHDL, LDLDIRECT in the last 72 hours. Thyroid Function Tests: No results for input(s): TSH, T4TOTAL, FREET4, T3FREE, THYROIDAB in the last 72 hours. Anemia Panel: No results for input(s): VITAMINB12, FOLATE, FERRITIN, TIBC, IRON, RETICCTPCT in the last 72 hours. Sepsis Labs: No results for input(s): PROCALCITON, LATICACIDVEN in the last 168 hours.  Recent Results (from the past 240 hour(s))  MRSA PCR Screening     Status: None  Collection Time: 05/18/20  5:34 PM   Specimen: Nasopharyngeal Swab  Result Value Ref Range Status   MRSA by PCR NEGATIVE NEGATIVE Final    Comment:        The GeneXpert MRSA Assay (FDA approved for NASAL specimens only), is one component of a comprehensive MRSA colonization surveillance program. It is not intended to diagnose MRSA infection nor to guide or monitor treatment for MRSA infections. Performed at [facility], [place] [address], [place], [place] [address]   Resp Panel by RT-PCR (Flu A&B, Covid)     Status: None   Collection Time: 05/18/20  5:34 PM  Result Value Ref Range Status   SARS Coronavirus 2 by RT PCR NEGATIVE NEGATIVE Final    Comment: (NOTE) SARS-CoV-2 target nucleic acids are NOT DETECTED.  The SARS-CoV-2 RNA is generally detectable in upper respiratory specimens during the acute phase of infection. The lowest concentration of SARS-CoV-2 viral copies this assay can detect is 138 copies/mL. A negative result does not preclude SARS-Cov-2 infection and should not be used as the sole basis for treatment or other patient management decisions. A negative result may occur with  improper specimen collection/handling, submission of  specimen other than nasopharyngeal swab, presence of viral mutation(s) within the areas targeted by this assay, and inadequate number of viral copies(<138 copies/mL). A negative result must be combined with clinical observations, patient history, and epidemiological information. The expected result is Negative.  Fact Sheet for Patients:  EntrepreneurPulse.com.au  Fact Sheet for Healthcare Providers:  IncredibleEmployment.be  This test is no t yet approved or cleared by the [place] FDA and  has been authorized for detection and/or diagnosis of SARS-CoV-2 by FDA under an Emergency Use Authorization (EUA). This EUA will remain  in effect (meaning this test can be used) for the duration of the COVID-19 declaration under Section 564(b)(1) of the Act, 21 U.S.C.section 360bbb-3(b)(1), unless the authorization is terminated  or revoked sooner.       Influenza A by PCR NEGATIVE NEGATIVE Final   Influenza B by PCR NEGATIVE NEGATIVE Final    Comment: (NOTE) The Xpert Xpress SARS-CoV-2/FLU/RSV plus assay is intended as an aid in the diagnosis of influenza from Nasopharyngeal swab specimens and should not be used as a sole basis for treatment. Nasal washings and aspirates are unacceptable for Xpert Xpress SARS-CoV-2/FLU/RSV testing.  Fact Sheet for Patients: EntrepreneurPulse.com.au  Fact Sheet for Healthcare Providers: IncredibleEmployment.be  This test is not yet approved or cleared by the [place] FDA and has been authorized for detection and/or diagnosis of SARS-CoV-2 by FDA under an Emergency Use Authorization (EUA). This EUA will remain in effect (meaning this test can be used) for the duration of the COVID-19 declaration under Section 564(b)(1) of the Act, 21 U.S.C. section 360bbb-3(b)(1), unless the authorization is terminated or revoked.  Performed at [facility], [place] [address],  [place], [place] [address]     Radiology Studies: DG CHEST PORT 1 VIEW  Result Date: 05/20/2020 CLINICAL DATA:  Cough.  Shortness of breath. EXAM: PORTABLE CHEST 1 VIEW COMPARISON:  05/01/2020.  CT 04/30/2020. FINDINGS: Mediastinum is stable. Heart size stable. Cavitary infiltrate right upper lung again noted without interim change. Spiculated density left mid lung again noted without interim change. Prominent bullous changes on the left again noted. No pleural effusion or pneumothorax. IMPRESSION: 1. Cavitary infiltrate right upper lung again noted without interim change. 2. Spiculated density left mid lung again noted without interim change. Electronically Signed  By: [place]   On: 05/20/2020 11:33   Scheduled Meds: . Derrill Memo ON 05/23/2020] amoxicillin-clavulanate  1 tablet Oral Q12H  . atorvastatin  20 mg Oral Daily  . bacitracin   Topical BID  . Chlorhexidine Gluconate Cloth  6 each Topical Q0600  . dextromethorphan-guaiFENesin  1 tablet Oral BID  . irbesartan  300 mg Oral Daily  . lactobacillus  1 g Oral TID WC  . multivitamin with minerals  1 tablet Oral Daily  . pantoprazole  40 mg Oral BID   Continuous Infusions: . piperacillin-tazobactam (ZOSYN)  IV 3.375 g (05/20/20 0949)     LOS: 2 days    Time spent: 35 mins    Tyaisha Cullom, MD [facility]   If 7PM-7AM, please contact night-coverage

## 2020-05-20 NOTE — Progress Notes (Signed)
Patient arrived from [place] to 60E25. Vital signs stable and patient denies pain. CHG bath completed. Patient placed on telemetry monitor and CCMD notified. Patient oriented to room. Bed low and locked, and call bell left within reach.  Gailen Shelter RN

## 2020-05-21 LAB — CBC
HCT: 20.7 % — ABNORMAL LOW (ref 39.0–52.0)
HCT: 27.1 % — ABNORMAL LOW (ref 39.0–52.0)
Hemoglobin: 6.8 g/dL — CL (ref 13.0–17.0)
Hemoglobin: 9.3 g/dL — ABNORMAL LOW (ref 13.0–17.0)
MCH: 28.2 pg (ref 26.0–34.0)
MCH: 28.7 pg (ref 26.0–34.0)
MCHC: 32.9 g/dL (ref 30.0–36.0)
MCHC: 34.3 g/dL (ref 30.0–36.0)
MCV: 85.9 fL (ref 80.0–100.0)
MCV: 83.6 fL (ref 80.0–100.0)
Platelets: 437 10*3/uL — ABNORMAL HIGH (ref 150–400)
Platelets: 464 10*3/uL — ABNORMAL HIGH (ref 150–400)
RBC: 2.41 MIL/uL — ABNORMAL LOW (ref 4.22–5.81)
RBC: 3.24 MIL/uL — ABNORMAL LOW (ref 4.22–5.81)
RDW: 15.6 % — ABNORMAL HIGH (ref 11.5–15.5)
RDW: 15.5 % (ref 11.5–15.5)
WBC: 9.4 10*3/uL (ref 4.0–10.5)
WBC: 9.3 10*3/uL (ref 4.0–10.5)
nRBC: 0 % (ref 0.0–0.2)
nRBC: 0 % (ref 0.0–0.2)

## 2020-05-21 LAB — BASIC METABOLIC PANEL
Anion gap: 8 (ref 5–15)
BUN: <5 mg/dL — ABNORMAL LOW (ref 8–23)
CO2: 25 mmol/L (ref 22–32)
Calcium: 8.4 mg/dL — ABNORMAL LOW (ref 8.9–10.3)
Chloride: 100 mmol/L (ref 98–111)
Creatinine, Ser: 0.6 mg/dL — ABNORMAL LOW (ref 0.61–1.24)
GFR, Estimated: >60 mL/min (ref >60)
Glucose, Bld: 100 mg/dL — ABNORMAL HIGH (ref 70–99)
Potassium: 3.7 mmol/L (ref 3.5–5.1)
Sodium: 133 mmol/L — ABNORMAL LOW (ref 135–145)

## 2020-05-21 LAB — ABO/RH: ABO/RH(D): B POS

## 2020-05-21 LAB — HEMOGLOBIN AND HEMATOCRIT, BLOOD
HCT: 23.6 % — ABNORMAL LOW (ref 39.0–52.0)
Hemoglobin: 7.8 g/dL — ABNORMAL LOW (ref 13.0–17.0)

## 2020-05-21 LAB — PREPARE RBC (CROSSMATCH)

## 2020-05-21 LAB — MAGNESIUM: Magnesium: 1.7 mg/dL (ref 1.7–2.4)

## 2020-05-21 MED ORDER — ACETAMINOPHEN 325 MG PO TABS
650.0000 mg | ORAL_TABLET | Freq: Once | ORAL | Status: AC
  Administered 2020-05-21: 650 mg via ORAL
  Filled 2020-05-21: qty 2

## 2020-05-21 MED ORDER — DIPHENHYDRAMINE HCL 25 MG PO CAPS
25.0000 mg | ORAL_CAPSULE | Freq: Once | ORAL | Status: AC
  Administered 2020-05-21: 25 mg via ORAL
  Filled 2020-05-21: qty 1

## 2020-05-21 MED ORDER — SODIUM CHLORIDE 0.9% IV SOLUTION: Freq: Once | INTRAVENOUS | Status: AC

## 2020-05-21 NOTE — Progress Notes (Addendum)
PROGRESS NOTE    Terry Page.  DSK:[id number] DOB: 04-17-1955 DOA: 05/18/2020 PCP: [name], MD   Brief Narrative: This 65 years old male with PMH significant for COPD, HTN, prostate cancer, left lung cancer and a recent hospitalization for COVID-pneumonia complicated by healthcare associated necrotizing pneumonia ( he was discharged on 05/10/2020) presented to the  ED from nursing home with complaints of epistaxis and hemoptysis.  He reports nosebleeds in the morning at the rehab facility and subsequently had a bloody vomitus versus hemoptysis.  He denies any shortness of breath, dizziness, chest pain or abdominal pain.  In the ED he had another episode of coughing versus vomiting of blood.  He was started on IV Protonix to treat possible GI bleed.  CTA chest shows moderate bullous emphysema and multifocal consolidation with areas of cavitation within the right upper lobe consistent with necrotizing pneumonia.  Assessment & Plan:   Active Problems:   Hemoptysis  Hemoptysis Vs Epistaxis Vs Hematemesis: It is difficult to determine etiology of bleeding.   Most likely patient has a nosebleed followed by coughing up blood. Hematemesis from GI bleed less likely given lack of abdominal pain, nausea, black or bloody bowel movement prior to today. Patient was admitted in ICU initially with a concern, He may require intubation for airway protection.  His hemoglobin remained stable, 7.7> 7.5>8.2>7.3>7.5>6.8 Case was discussed with ENT, recommended applying bacitracin ointment inside the nose twice daily.  Continue Protonix 40 mg p.o. twice daily for possible bleed,  started on clear liquid diet. Repeat CTA chest showed increasing fluid filled bulla of the left upper lobe when compared to the CT chest a week ago. Patient is on room air and normotensive without complaints. Antibiotics changed from Augmentin to Zosyn. De-escalate to Augmentin at discharge to continue through the end of May  2022. May need longer depending upon response on the imaging. Patient has outpatient scheduled appointment with Dr. Valeta Harms. Pulmonology signed off. His hemoglobin has slightly dropped from 7.5-6.8.  He is getting 1 unit PRBC.   Hypertension: Continue irbesartan  COPD: Continue home medications.  Hyperlipidemia : Continue atorvastatin  History of COVID PNA/  Hospital acquired necrotizing pneumonia: He was treated in the previous admission, prolonged Augmentin course.  CTA chest shows right upper lobe consolidation consistent with necrotizing pneumonia.  Patient is currently on Augmentin.  Antibiotic changed to Zosyn.   DVT prophylaxis: SCDs Code Status: Full code. Family Communication: No family at bed side. Disposition Plan:   Status is: Inpatient  Remains inpatient appropriate because:Inpatient level of care appropriate due to severity of illness   Dispo: The patient is from: Home              Anticipated d/c is to: Home on 5/21              Patient currently is not medically stable to d/c.   Difficult to place patient No   Consultants:   PCCM  Procedures:  Antimicrobials:  Anti-infectives (From admission, onward)   Start     Dose/Rate Route Frequency Ordered Stop   05/23/20 2200  amoxicillin-clavulanate (AUGMENTIN) 875-125 MG per tablet 1 tablet        1 tablet Oral Every 12 hours 05/20/20 0906 06/01/20 0959   05/18/20 2200  amoxicillin-clavulanate (AUGMENTIN) 875-125 MG per tablet 1 tablet  Status:  Discontinued        1 tablet Oral Every 12 hours 05/18/20 1652 05/18/20 1756   05/18/20 1830  piperacillin-tazobactam (ZOSYN) IVPB 3.375  g        3.375 g 12.5 mL/hr over 240 Minutes Intravenous Every 8 hours 05/18/20 1815 05/23/20 1829      Subjective: Patient was seen and examined at bedside.  He feels better,  denies any further bleeding.   He denies any shortness of breath.  He is getting 1 unit PRBC,  his hemoglobin has dropped.   Objective: Vitals:    05/21/20 0954 05/21/20 1016 05/21/20 1116 05/21/20 1436  BP: (!) 136/91 (!) 132/94 120/83 123/79  Pulse: 96 99 97 98  Resp: 20 20 19 20   Temp: 98.3 F (36.8 C) 98.3 F (36.8 C) 98.1 F (36.7 C) 98 F (36.7 C)  TempSrc: Oral Oral Oral Oral  SpO2: 100% 100% 99% 100%  Weight:      Height:        Intake/Output Summary (Last 24 hours) at 05/21/2020 1513 Last data filed at 05/21/2020 1436 Gross per 24 hour  Intake 1287.19 ml  Output 1410 ml  Net -122.81 ml   Filed Weights   05/20/20 0500 05/20/20 1853 05/21/20 0442  Weight: 65 kg 68 kg 65 kg    Examination:  General exam: Appears calm and comfortable, not in any acute distress. Respiratory system: Clear to auscultation. Respiratory effort normal. Cardiovascular system: S1 & S2 heard, RRR. No JVD, murmurs, rubs, gallops or clicks. No pedal edema. Gastrointestinal system: Abdomen is nondistended, soft and nontender. No organomegaly or masses felt.  Normal bowel sounds heard. Central nervous system: Alert and oriented. No focal neurological deficits. Extremities: Symmetric 5 x 5 power. Skin: No rashes, lesions or ulcers Psychiatry: Judgement and insight appear normal. Mood & affect appropriate.     Data Reviewed: I have personally reviewed following labs and imaging studies  CBC: Recent Labs  Lab 05/18/20 1746 05/19/20 0047 05/19/20 1521 05/20/20 0205 05/20/20 0932 05/21/20 0133 05/21/20 0916  WBC 11.7* 11.4*  --  10.5  --  9.4  --   NEUTROABS 9.6*  --   --   --   --   --   --   HGB 7.7* 7.5* 8.2* 7.3* 7.5* 6.8* 7.8*  HCT 23.2* 23.1* 25.5* 22.0* 22.6* 20.7* 23.6*  MCV 86.9 86.5  --  85.9  --  85.9  --   PLT 425* 403*  --  436*  --  437*  --    Basic Metabolic Panel: Recent Labs  Lab 05/19/20 0047 05/19/20 1521 05/20/20 0205 05/21/20 0133  NA 132*  --  133* 133*  K 3.1*  --  3.8 3.7  CL 99  --  101 100  CO2 25  --  25 25  GLUCOSE 100*  --  104* 100*  BUN <5*  --  <5* <5*  CREATININE 0.53*  --  0.69 0.60*   CALCIUM 8.5*  --  8.5* 8.4*  MG  --  1.6* 1.4* 1.7  PHOS  --  3.7  --   --    GFR: Estimated Creatinine Clearance: 83.5 mL/min (A) (by C-G formula based on SCr of 0.6 mg/dL (L)). Liver Function Tests: No results for input(s): AST, ALT, ALKPHOS, BILITOT, PROT, ALBUMIN in the last 168 hours. No results for input(s): LIPASE, AMYLASE in the last 168 hours. No results for input(s): AMMONIA in the last 168 hours. Coagulation Profile: Recent Labs  Lab 05/18/20 1746  INR 1.2   Cardiac Enzymes: No results for input(s): CKTOTAL, CKMB, CKMBINDEX, TROPONINI in the last 168 hours. BNP (last 3 results) No results for  input(s): PROBNP in the last 8760 hours. HbA1C: No results for input(s): HGBA1C in the last 72 hours. CBG: Recent Labs  Lab 05/18/20 1620 05/19/20 0738  GLUCAP 85 96   Lipid Profile: No results for input(s): CHOL, HDL, LDLCALC, TRIG, CHOLHDL, LDLDIRECT in the last 72 hours. Thyroid Function Tests: No results for input(s): TSH, T4TOTAL, FREET4, T3FREE, THYROIDAB in the last 72 hours. Anemia Panel: No results for input(s): VITAMINB12, FOLATE, FERRITIN, TIBC, IRON, RETICCTPCT in the last 72 hours. Sepsis Labs: No results for input(s): PROCALCITON, LATICACIDVEN in the last 168 hours.  Recent Results (from the past 240 hour(s))  MRSA PCR Screening     Status: None   Collection Time: 05/18/20  5:34 PM   Specimen: Nasopharyngeal Swab  Result Value Ref Range Status   MRSA by PCR NEGATIVE NEGATIVE Final    Comment:        The GeneXpert MRSA Assay (FDA approved for NASAL specimens only), is one component of a comprehensive MRSA colonization surveillance program. It is not intended to diagnose MRSA infection nor to guide or monitor treatment for MRSA infections. Performed at [facility], [place] [address] [place], [place] [address]   Resp Panel by RT-PCR (Flu A&B, Covid)     Status: None   Collection Time: 05/18/20  5:34 PM  Result Value Ref Range Status   SARS  Coronavirus 2 by RT PCR NEGATIVE NEGATIVE Final    Comment: (NOTE) SARS-CoV-2 target nucleic acids are NOT DETECTED.  The SARS-CoV-2 RNA is generally detectable in upper respiratory specimens during the acute phase of infection. The lowest concentration of SARS-CoV-2 viral copies this assay can detect is 138 copies/mL. A negative result does not preclude SARS-Cov-2 infection and should not be used as the sole basis for treatment or other patient management decisions. A negative result may occur with  improper specimen collection/handling, submission of specimen other than nasopharyngeal swab, presence of viral mutation(s) within the areas targeted by this assay, and inadequate number of viral copies(<138 copies/mL). A negative result must be combined with clinical observations, patient history, and epidemiological information. The expected result is Negative.  Fact Sheet for Patients:  EntrepreneurPulse.com.au  Fact Sheet for Healthcare Providers:  IncredibleEmployment.be  This test is no t yet approved or cleared by the [place] FDA and  has been authorized for detection and/or diagnosis of SARS-CoV-2 by FDA under an Emergency Use Authorization (EUA). This EUA will remain  in effect (meaning this test can be used) for the duration of the COVID-19 declaration under Section 564(b)(1) of the Act, 21 U.S.C.section 360bbb-3(b)(1), unless the authorization is terminated  or revoked sooner.       Influenza A by PCR NEGATIVE NEGATIVE Final   Influenza B by PCR NEGATIVE NEGATIVE Final    Comment: (NOTE) The Xpert Xpress SARS-CoV-2/FLU/RSV plus assay is intended as an aid in the diagnosis of influenza from Nasopharyngeal swab specimens and should not be used as a sole basis for treatment. Nasal washings and aspirates are unacceptable for Xpert Xpress SARS-CoV-2/FLU/RSV testing.  Fact Sheet for  Patients: EntrepreneurPulse.com.au  Fact Sheet for Healthcare Providers: IncredibleEmployment.be  This test is not yet approved or cleared by the [place] FDA and has been authorized for detection and/or diagnosis of SARS-CoV-2 by FDA under an Emergency Use Authorization (EUA). This EUA will remain in effect (meaning this test can be used) for the duration of the COVID-19 declaration under Section 564(b)(1) of the Act, 21 U.S.C. section 360bbb-3(b)(1), unless the authorization is  terminated or revoked.  Performed at [facility], [place] [address], [place], [place] [address]     Radiology Studies: DG CHEST PORT 1 VIEW  Result Date: 05/20/2020 CLINICAL DATA:  Cough.  Shortness of breath. EXAM: PORTABLE CHEST 1 VIEW COMPARISON:  05/01/2020.  CT 04/30/2020. FINDINGS: Mediastinum is stable. Heart size stable. Cavitary infiltrate right upper lung again noted without interim change. Spiculated density left mid lung again noted without interim change. Prominent bullous changes on the left again noted. No pleural effusion or pneumothorax. IMPRESSION: 1. Cavitary infiltrate right upper lung again noted without interim change. 2. Spiculated density left mid lung again noted without interim change. Electronically Signed   By: Marcello Moores  Register   On: 05/20/2020 11:33   Scheduled Meds: . Derrill Memo ON 05/23/2020] amoxicillin-clavulanate  1 tablet Oral Q12H  . atorvastatin  20 mg Oral Daily  . bacitracin   Topical BID  . Chlorhexidine Gluconate Cloth  6 each Topical Q0600  . dextromethorphan-guaiFENesin  1 tablet Oral BID  . irbesartan  300 mg Oral Daily  . lactobacillus  1 g Oral TID WC  . multivitamin with minerals  1 tablet Oral Daily  . pantoprazole  40 mg Oral BID   Continuous Infusions: . piperacillin-tazobactam (ZOSYN)  IV 3.375 g (05/21/20 0937)     LOS: 3 days    Time spent: 25 mins    Travelle Mcclimans, MD [facility]   If 7PM-7AM,  please contact night-coverage

## 2020-05-21 NOTE — Care Management Important Message (Signed)
Important Message  Patient Details  Name: Terry Page. MRN: [id number] Date of Birth: 1955/03/29   Medicare Important Message Given:  Yes     Shelda Altes 05/21/2020, 8:33 AM

## 2020-05-21 NOTE — Progress Notes (Signed)
Notified by RN that Hgb this am is 6.8.  No sign of active bleeding at this time Will transfuse one unit of PRBC.  Monitor Hgb

## 2020-05-21 NOTE — Plan of Care (Signed)
  Problem: Clinical Measurements: Goal: Will remain free from infection Outcome: Progressing Goal: Diagnostic test results will improve Outcome: Progressing   

## 2020-05-22 ENCOUNTER — Other Ambulatory Visit: Payer: Self-pay

## 2020-05-22 ENCOUNTER — Encounter (HOSPITAL_COMMUNITY): Payer: Self-pay | Admitting: Pulmonary Disease

## 2020-05-22 DIAGNOSIS — J189 Pneumonia, unspecified organism: Secondary | ICD-10-CM

## 2020-05-22 DIAGNOSIS — J984 Other disorders of lung: Secondary | ICD-10-CM

## 2020-05-22 LAB — TYPE AND SCREEN
ABO/RH(D): B POS
Antibody Screen: NEGATIVE
Unit division: 0

## 2020-05-22 LAB — BASIC METABOLIC PANEL
Anion gap: 10 (ref 5–15)
BUN: <5 mg/dL — ABNORMAL LOW (ref 8–23)
CO2: 23 mmol/L (ref 22–32)
Calcium: 8.8 mg/dL — ABNORMAL LOW (ref 8.9–10.3)
Chloride: 100 mmol/L (ref 98–111)
Creatinine, Ser: 0.67 mg/dL (ref 0.61–1.24)
GFR, Estimated: >60 mL/min (ref >60)
Glucose, Bld: 104 mg/dL — ABNORMAL HIGH (ref 70–99)
Potassium: 3.6 mmol/L (ref 3.5–5.1)
Sodium: 133 mmol/L — ABNORMAL LOW (ref 135–145)

## 2020-05-22 LAB — BPAM RBC
Blood Product Expiration Date: 202206112359
ISSUE DATE / TIME: 202205200949
Unit Type and Rh: 7300

## 2020-05-22 LAB — HEMOGLOBIN AND HEMATOCRIT, BLOOD
HCT: 28.8 % — ABNORMAL LOW (ref 39.0–52.0)
Hemoglobin: 9.8 g/dL — ABNORMAL LOW (ref 13.0–17.0)

## 2020-05-22 MED ORDER — BENZONATATE 100 MG PO CAPS: 100.0000 mg | ORAL_CAPSULE | Freq: Three times a day (TID) | ORAL | 0 refills | Status: DC | PRN

## 2020-05-22 MED ORDER — FLORANEX PO PACK: 1.0000 g | PACK | Freq: Three times a day (TID) | ORAL | 0 refills | Status: AC

## 2020-05-22 MED ORDER — DM-GUAIFENESIN ER 30-600 MG PO TB12: 1.0000 | ORAL_TABLET | Freq: Two times a day (BID) | ORAL | 0 refills | Status: DC

## 2020-05-22 MED ORDER — ENSURE ENLIVE PO LIQD: 237.0000 mL | ORAL | 0 refills | Status: AC

## 2020-05-22 MED ORDER — AMOXICILLIN-POT CLAVULANATE 875-125 MG PO TABS: 1.0000 | ORAL_TABLET | Freq: Two times a day (BID) | ORAL | 0 refills | Status: AC

## 2020-05-22 MED ORDER — PANTOPRAZOLE SODIUM 40 MG PO TBEC: 40.0000 mg | DELAYED_RELEASE_TABLET | Freq: Two times a day (BID) | ORAL | 0 refills | Status: DC

## 2020-05-22 NOTE — Plan of Care (Signed)
Progressing, will continue to monitor.  

## 2020-05-22 NOTE — Plan of Care (Signed)
  Problem: Clinical Measurements: Goal: Will remain free from infection Outcome: Progressing Goal: Diagnostic test results will improve Outcome: Progressing   

## 2020-05-22 NOTE — Progress Notes (Signed)
Pt able to walk w/ mobility. O2 sats 96 %. MD made aware.

## 2020-05-22 NOTE — Progress Notes (Signed)
Mobility Specialist: Progress Note   05/22/20 1203  Mobility  Activity Ambulated in hall  Level of Assistance Independent after set-up  Assistive Device Front wheel walker  Distance Ambulated (ft) 200 ft  Mobility Ambulated with assistance in hallway  Mobility Response Tolerated well  Mobility performed by Mobility specialist  $Mobility charge 1 Mobility   Pre-Mobility: 112 HR During Mobility: 148 HR, 96% SpO2 Post-Mobility: 134 HR, 154/76 BP, 90-92% SpO2  Pt c/o feeling a little SOB after ambulation, otherwise asx. Pt's condom catheter came off after getting back in the bed. Pt to chair for sheets to be changed. Pt back to bed with call bell at his side.   [facility] Alea Ryer Mobility Specialist Mobility Specialist Phone: [phone]

## 2020-05-26 NOTE — Discharge Summary (Signed)
[facility] Discharge Summary   Patient: Terry Page. DTO:[id number]  PCP: [name], MD  Date of admission: 05/18/2020   Date of discharge: 05/22/2020     Discharge Diagnoses:  Principal diagnosis Pneumonia  Active Problems:   Hemoptysis   Admitted From: Home Disposition:  Home  Recommendations for Outpatient Follow-up:  1. PCP: Follow-up with PCP as well as pulmonary as recommended   Follow-up Information    [name], MD. Schedule an appointment as soon as possible for a visit in 1 week(s).   Specialty: Family Medicine Contact information: [place] [address] [phone]        [name], DO. Go on 06/03/2020.   Specialty: Pulmonary Disease Contact information: [address] [place] [address] [phone]              Discharge Instructions    Diet - low sodium heart healthy   Complete by: As directed    Increase activity slowly   Complete by: As directed       Diet recommendation: Cardiac diet  Activity: The patient is advised to gradually reintroduce usual activities, as tolerated  Discharge Condition: stable  Code Status: Full code   History of present illness: As per the H and P dictated on admission, "Terry Page is a 65 y/o male with past medical history of COPD, HTN, prostate cancer, left lung cancer and a recent hospital admission for GDJME-26 pneumonia complicated by healthcare associated necrotizing pneumonia (discharged 05/10/20) who presented to the the [facility] ED on 5/17 from a SNF with complaints of epistaxis and hemoptysis. He reportedly got a bloody nose this morning at his rehab facility and subsequently bloody vomit vs hemoptysis. He denies shortness of breath, dizziness, chest pain or abdominal pain. He has not noticed any changes in his usual health over the past few days. He reports he has been eating/drinking normally and has not noticed any blood or  changes in his bowel movements. He denies any history of a GI bleed. Pt takes a daily aspirin, but no other blood thinners.  In the ED he was slightly hypertensive (155/86) and tachycardic (124). Tachycardia improved after receiving IVFs. While in the ED, he had another episode of coughing vs. vomiting up blood, and he was started on IV protonix to treat possible GI bleed. Patient has pictures from the ED that show a large amount of clot and blood that he coughed up (see below in physical exam for photo). CBC significant for WBC 12.7 and Hgb 9.1. CMP significant for K 3.1. CTA showed moderate bullous emphysema and multifocal consolidation with areas of cavitation within RUL consistent with necrotizing pneumonia for which patient is being treated for already."  Hospital Course:  Summary of his active problems in the hospital is as following.   Hemoptysis Vs Epistaxis Vs Hematemesis: It is difficult to determine etiology of bleeding.   Most likely patient has a nosebleed followed by coughing up blood. Hematemesis from GI bleed less likely given lack of abdominal pain, nausea, black or bloody bowel movement prior to today. Patient was admitted in ICU initially with a concern, He may require intubation for airway protection.  His hemoglobin remained stable, 7.7> 7.5>8.2>7.3>7.5>6.8 Case was discussed with ENT, recommended applying bacitracin ointment inside the nose twice daily.  Continue Protonix 40 mg p.o. twice daily for possible bleed,  started on clear liquid diet. Repeat CTA chest showed increasing fluid filled bulla of the  left upper lobe when compared to the CT chest a week ago. Patient is on room air and normotensive without complaints. Antibiotics changed from Augmentin to Zosyn. On discharge back on Augmentin. Patient has outpatient scheduled appointment with Dr. Valeta Harms. Pulmonology signed off. 51 kg (therapeutic) stable.  Hypertension: Continue irbesartan  COPD: Continue home  medications.  Hyperlipidemia : Continue atorvastatin  History of COVID PNA/  Hospital acquired necrotizing pneumonia: He was treated in the previous admission, prolonged Augmentin course.  CTA chest shows right upper lobe consolidation consistent with necrotizing pneumonia.  Patient is currently on Augmentin.  Antibiotic changed to Zosyn.  On discharge back on Augmentin.  Body mass index is 21.72 kg/m.    Nutrition Interventions:         Patient was ambulatory without any assistance. On the day of the discharge the patient's vitals were stable, and no other new acute medical condition were reported. The patient was felt safe to be discharge at Home with no therapy needed on discharge.  Consultants: Pulmonary Procedures: PRBC transfusion  DISCHARGE MEDICATION: Allergies as of 05/22/2020   No Known Allergies     Medication List    STOP taking these medications   aspirin EC 81 MG tablet   guaiFENesin 600 MG 12 hr tablet Commonly known as: MUCINEX   naproxen sodium 220 MG tablet Commonly known as: ALEVE     TAKE these medications   acetaminophen 325 MG tablet Commonly known as: TYLENOL Take 2 tablets (650 mg total) by mouth every 6 (six) hours as needed for mild pain or headache (fever >/= 101).   albuterol 108 (90 Base) MCG/ACT inhaler Commonly known as: VENTOLIN HFA Inhale 2 puffs into the lungs every 6 (six) hours as needed for wheezing or shortness of breath.   alum & mag hydroxide-simeth 200-200-20 MG/5ML suspension Commonly known as: MAALOX/MYLANTA Take 15 mLs by mouth every 6 (six) hours as needed for indigestion or heartburn.   amoxicillin-clavulanate 875-125 MG tablet Commonly known as: AUGMENTIN Take 1 tablet by mouth 2 (two) times daily for 14 days. What changed: when to take this   atorvastatin 20 MG tablet Commonly known as: LIPITOR Take 20 mg by mouth every evening.   benzonatate 100 MG capsule Commonly known as: Tessalon Perles Take 1 capsule  (100 mg total) by mouth 3 (three) times daily as needed for cough.   dextromethorphan-guaiFENesin 30-600 MG 12hr tablet Commonly known as: MUCINEX DM Take 1 tablet by mouth 2 (two) times daily.   diphenoxylate-atropine 2.5-0.025 MG tablet Commonly known as: LOMOTIL Take 1 tablet by mouth 4 (four) times daily as needed for diarrhea or loose stools.   feeding supplement Liqd Take 237 mLs by mouth daily.   ipratropium-albuterol 0.5-2.5 (3) MG/3ML Soln Commonly known as: DUONEB Take 3 mLs by nebulization every 6 (six) hours as needed. What changed: reasons to take this   lactobacillus Pack Take 1 packet (1 g total) by mouth 3 (three) times daily with meals.   lipase/protease/amylase 12000-38000 units Cpep capsule Commonly known as: CREON Take 1 capsule (12,000 Units total) by mouth 3 (three) times daily before meals.   losartan 100 MG tablet Commonly known as: COZAAR Take 100 mg by mouth daily.   multivitamin with minerals Tabs tablet Take 1 tablet by mouth daily.   pantoprazole 40 MG tablet Commonly known as: PROTONIX Take 1 tablet (40 mg total) by mouth 2 (two) times daily for 13 days.   polyethylene glycol 17 g packet Commonly known as: MIRALAX /  GLYCOLAX Take 17 g by mouth 2 (two) times daily.       Discharge Exam: Filed Weights   05/20/20 1853 05/21/20 0442 05/22/20 0321  Weight: 68 kg 65 kg 66.7 kg   Vitals:   05/22/20 0321 05/22/20 0840  BP: (!) 133/97 124/82  Pulse: 71 100  Resp: (!) 22 20  Temp: 98.2 F (36.8 C) 98.2 F (36.8 C)  SpO2: 97% 97%   General: Appear in mild distress, no Rash; Oral Mucosa Clear, moist. no Abnormal Neck Mass Or lumps, Conjunctiva normal  Cardiovascular: S1 and S2 Present, no Murmur, Respiratory: good respiratory effort, Bilateral Air entry present and CTA, no Crackles, no wheezes Abdomen: Bowel Sound present, Soft and no tenderness Extremities: no Pedal edema Neurology: alert and oriented to time, place, and person affect  appropriate. no new focal deficit Gait not checked due to patient safety concerns    The results of significant diagnostics from this hospitalization (including imaging, microbiology, ancillary and laboratory) are listed below for reference.    Significant Diagnostic Studies: DG Chest 2 View  Result Date: 04/30/2020 CLINICAL DATA:  Shortness of breath.  History of left lung cancer. EXAM: CHEST - 2 VIEW COMPARISON:  April 26, 2020. FINDINGS: The heart size and mediastinal contours are within normal limits. Loculated hydropneumothorax or cavitating pneumonia is noted in right upper lobe. Stable left midlung scarring is noted. The visualized skeletal structures are unremarkable. IMPRESSION: Air-fluid level is noted in right upper lobe consistent with loculated hydropneumothorax or cavitating pneumonia. CT scan is recommended for further evaluation. Electronically Signed   By: Marijo Conception M.D.   On: 04/30/2020 12:25   CT CHEST W CONTRAST  Result Date: 04/30/2020 CLINICAL DATA:  Pneumonia. Shortness of breath. 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 CONTRAST:  20mL OMNIPAQUE IOHEXOL 300 MG/ML  SOLN COMPARISON:  04/10/2020, 04/26/2020, 01/07/2020, 02/06/2020 (PET-CT) FINDINGS: Cardiovascular: No significant vascular findings. Normal heart size. No pericardial effusion. Thoracic aortic atherosclerosis. Multi vessel coronary artery atherosclerosis. Mediastinum/Nodes: Enlarged subcarinal lymph node measuring 12 mm. No hilar or axillary lymphadenopathy. Esophagus is normal. Trachea is normal. Thyroid gland is normal. Lungs/Pleura: Spiculated 11 mm left upper lobe pulmonary nodule. 7 mm nodular area in the left upper lobe more inferiorly adjacent to a bulla similar in appearance to the prior examination likely reflecting area of scarring. Bilateral emphysematous  changes with bullous disease at the apices. Severe right upper lobe airspace disease. Interval development of cavitary areas in the right upper lobe with multiple air-fluid levels most concerning for necrotizing pneumonia with the overall area measuring approximately 9.2 x 7.8 cm in transverse dimension. Small right pleural effusion. Interstitial and alveolar airspace disease in the right middle lobe, bilateral lower lobes concerning for multilobar pneumonia. Upper Abdomen: No acute abnormality.  Atrophic spleen. Musculoskeletal: No acute osseous abnormality. IMPRESSION: 1. Severe right upper lobe airspace disease with interval development of cavitary areas in the right upper lobe with multiple air-fluid levels most concerning worsening for necrotizing pneumonia. 2. Interstitial and alveolar airspace disease in the right middle lobe, bilateral lower lobes concerning for worsening multilobar pneumonia. 3. Small right pleural effusion. 4. Spiculated 11 mm left upper lobe pulmonary nodule concerning for malignancy. 5. Aortic Atherosclerosis (ICD10-I70.0) and Emphysema (ICD10-J43.9). Electronically Signed   By: Kathreen Devoid   On: 04/30/2020 21:05   DG CHEST PORT 1 VIEW  Result Date: 05/20/2020 CLINICAL  DATA:  Cough.  Shortness of breath. EXAM: PORTABLE CHEST 1 VIEW COMPARISON:  05/01/2020.  CT 04/30/2020. FINDINGS: Mediastinum is stable. Heart size stable. Cavitary infiltrate right upper lung again noted without interim change. Spiculated density left mid lung again noted without interim change. Prominent bullous changes on the left again noted. No pleural effusion or pneumothorax. IMPRESSION: 1. Cavitary infiltrate right upper lung again noted without interim change. 2. Spiculated density left mid lung again noted without interim change. Electronically Signed   By: Marcello Moores  Register   On: 05/20/2020 11:33   DG CHEST PORT 1 VIEW  Result Date: 05/01/2020 CLINICAL DATA:  Worsening shortness of breath and cough.  EXAM: PORTABLE CHEST 1 VIEW COMPARISON:  CT scan April 30, 2020.  Chest x-ray April 30, 2020. FINDINGS: The right upper lobe infiltrate with associated cavitation is stable. The spiculated nodule in the left upper lobe is unchanged. Emphysematous changes are noted. Mild bibasilar opacities are stable. Scarring in the left perihilar region is stable. No other interval changes. IMPRESSION: 1. Persistent, stable cavitary infiltrate in the right upper lobe. 2. The spiculated nodule in the left upper lobe is seen on the CT scan from April 30, 2020, concerning for primary malignancy. 3. No other interval changes. Electronically Signed   By: Dorise Bullion III M.D   On: 05/01/2020 12:28   DG Chest Port 1 View  Result Date: 04/26/2020 CLINICAL DATA:  Closed emphysema, left upper lobe nodule, worsening productive cough EXAM: PORTABLE CHEST 1 VIEW COMPARISON:  04/10/2020 FINDINGS: Increased consolidation in the right upper lung. Increased bibasilar consolidation. No significant pleural effusion. No pneumothorax. Stable cardiomediastinal contours. IMPRESSION: Increased consolidation in the right upper lung may be parenchymal or within bullous cavity. Additional bibasilar consolidation. Pneumonia suspected. Electronically Signed   By: Macy Mis M.D.   On: 04/26/2020 11:53   DG Abd Portable 1V  Result Date: 04/28/2020 CLINICAL DATA:  Abdominal distension. EXAM: PORTABLE ABDOMEN - 1 VIEW COMPARISON:  April 18, 2020. FINDINGS: The bowel gas pattern is normal. No radio-opaque calculi or other significant radiographic abnormality are seen. IMPRESSION: Negative. Electronically Signed   By: Marijo Conception M.D.   On: 04/28/2020 15:54   ECHOCARDIOGRAM LIMITED  Result Date: 05/02/2020    ECHOCARDIOGRAM LIMITED REPORT   Patient Name:   Terry Page. Date of Exam: 05/02/2020 Medical Rec #:  [id number]            Height:       69.0 in Accession #:    [id number]           Weight:       152.8 lb Date of Birth:  09-19-1955              BSA:          1.843 m Patient Age:    44 years             BP:           112/64 mmHg Patient Gender: M                    HR:           92 bpm. Exam Location:  Inpatient Procedure: Limited Echo and Limited Color Doppler Indications:    Bacteremia R78.81  History:        Patient has prior history of Echocardiogram examinations, most                 recent  04/14/2020. Risk Factors:Current Smoker and Hypertension.                 COVID-19. Cancer. Alcohol abuse.  Sonographer:    Tiffany Dance Referring Phys: [id number] [place]  1. Left ventricular ejection fraction, by estimation, is 65 to 70%. The left ventricle has normal function. The left ventricle has no regional wall motion abnormalities.  2. Right ventricular systolic function is normal. The right ventricular size is normal.  3. There is a rounded density off the anterior mitral valve leaflet that likely is calcification, but in the setting of bacteremia, cannot rule out vegetation. The lack of any significant MR makes endocarditis less likely. No evidence of mitral valve regurgitation. No evidence of mitral stenosis.  4. The aortic valve is normal in structure. Aortic valve regurgitation is not visualized. No aortic stenosis is present.  5. There is mild dilatation of the aortic root, measuring 40 mm.  6. The inferior vena cava is normal in size with greater than 50% respiratory variability, suggesting right atrial pressure of 3 mmHg. FINDINGS  Left Ventricle: Left ventricular ejection fraction, by estimation, is 65 to 70%. The left ventricle has normal function. The left ventricle has no regional wall motion abnormalities. The left ventricular internal cavity size was normal in size. There is  no left ventricular hypertrophy. Right Ventricle: The right ventricular size is normal. No increase in right ventricular wall thickness. Right ventricular systolic function is normal. Left Atrium: Left atrial size was normal in size. Right Atrium:  Right atrial size was normal in size. Pericardium: There is no evidence of pericardial effusion. Mitral Valve: There is a rounded density off the anterior mitral valve leaflet that likely is calcification, but in the setting of bacteremia, cannot rule out vegetation. The mitral valve is normal in structure. No evidence of mitral valve stenosis. Tricuspid Valve: The tricuspid valve is normal in structure. Tricuspid valve regurgitation is not demonstrated. No evidence of tricuspid stenosis. Aortic Valve: The aortic valve is normal in structure. Aortic valve regurgitation is not visualized. No aortic stenosis is present. Pulmonic Valve: The pulmonic valve was normal in structure. Pulmonic valve regurgitation is trivial. No evidence of pulmonic stenosis. Aorta: The aortic root is normal in size and structure. There is mild dilatation of the aortic root, measuring 40 mm. Venous: The inferior vena cava is normal in size with greater than 50% respiratory variability, suggesting right atrial pressure of 3 mmHg. IAS/Shunts: The interatrial septum appears to be lipomatous. No atrial level shunt detected by color flow Doppler. LEFT VENTRICLE PLAX 2D LVIDd:         4.30 cm LVIDs:         3.60 cm LV PW:         1.20 cm LV IVS:        1.10 cm LVOT diam:     2.20 cm LVOT Area:     3.80 cm  IVC IVC diam: 1.20 cm LEFT ATRIUM         Index LA diam:    2.80 cm 1.52 cm/m   AORTA Ao Root diam: 4.00 cm  SHUNTS Systemic Diam: 2.20 cm Fransico Him MD Electronically signed by Fransico Him MD Signature Date/Time: 05/02/2020/1:27:17 PM    Final     Microbiology: Recent Results (from the past 240 hour(s))  MRSA PCR Screening     Status: None   Collection Time: 05/18/20  5:34 PM   Specimen: Nasopharyngeal Swab  Result Value Ref Range  Status   MRSA by PCR NEGATIVE NEGATIVE Final    Comment:        The GeneXpert MRSA Assay (FDA approved for NASAL specimens only), is one component of a comprehensive MRSA colonization surveillance  program. It is not intended to diagnose MRSA infection nor to guide or monitor treatment for MRSA infections. Performed at [facility], [place] [address] [place], [place] [address]   Resp Panel by RT-PCR (Flu A&B, Covid)     Status: None   Collection Time: 05/18/20  5:34 PM  Result Value Ref Range Status   SARS Coronavirus 2 by RT PCR NEGATIVE NEGATIVE Final    Comment: (NOTE) SARS-CoV-2 target nucleic acids are NOT DETECTED.  The SARS-CoV-2 RNA is generally detectable in upper respiratory specimens during the acute phase of infection. The lowest concentration of SARS-CoV-2 viral copies this assay can detect is 138 copies/mL. A negative result does not preclude SARS-Cov-2 infection and should not be used as the sole basis for treatment or other patient management decisions. A negative result may occur with  improper specimen collection/handling, submission of specimen other than nasopharyngeal swab, presence of viral mutation(s) within the areas targeted by this assay, and inadequate number of viral copies(<138 copies/mL). A negative result must be combined with clinical observations, patient history, and epidemiological information. The expected result is Negative.  Fact Sheet for Patients:  EntrepreneurPulse.com.au  Fact Sheet for Healthcare Providers:  IncredibleEmployment.be  This test is no t yet approved or cleared by the [place] FDA and  has been authorized for detection and/or diagnosis of SARS-CoV-2 by FDA under an Emergency Use Authorization (EUA). This EUA will remain  in effect (meaning this test can be used) for the duration of the COVID-19 declaration under Section 564(b)(1) of the Act, 21 U.S.C.section 360bbb-3(b)(1), unless the authorization is terminated  or revoked sooner.       Influenza A by PCR NEGATIVE NEGATIVE Final   Influenza B by PCR NEGATIVE NEGATIVE Final    Comment: (NOTE) The Xpert Xpress  SARS-CoV-2/FLU/RSV plus assay is intended as an aid in the diagnosis of influenza from Nasopharyngeal swab specimens and should not be used as a sole basis for treatment. Nasal washings and aspirates are unacceptable for Xpert Xpress SARS-CoV-2/FLU/RSV testing.  Fact Sheet for Patients: EntrepreneurPulse.com.au  Fact Sheet for Healthcare Providers: IncredibleEmployment.be  This test is not yet approved or cleared by the [place] FDA and has been authorized for detection and/or diagnosis of SARS-CoV-2 by FDA under an Emergency Use Authorization (EUA). This EUA will remain in effect (meaning this test can be used) for the duration of the COVID-19 declaration under Section 564(b)(1) of the Act, 21 U.S.C. section 360bbb-3(b)(1), unless the authorization is terminated or revoked.  Performed at [facility], [place] [address] [place], [place] [address]      Labs: CBC: Recent Labs  Lab 05/19/20 0047 05/19/20 1521 05/20/20 0205 05/20/20 0932 05/21/20 0133 05/21/20 0916 05/21/20 1821 05/22/20 0105  WBC 11.4*  --  10.5  --  9.4  --  9.3  --   HGB 7.5*   < > 7.3* 7.5* 6.8* 7.8* 9.3* 9.8*  HCT 23.1*   < > 22.0* 22.6* 20.7* 23.6* 27.1* 28.8*  MCV 86.5  --  85.9  --  85.9  --  83.6  --   PLT 403*  --  436*  --  437*  --  464*  --    < > = values in this interval  not displayed.   Basic Metabolic Panel: Recent Labs  Lab 05/19/20 0047 05/19/20 1521 05/20/20 0205 05/21/20 0133 05/22/20 0105  NA 132*  --  133* 133* 133*  K 3.1*  --  3.8 3.7 3.6  CL 99  --  101 100 100  CO2 25  --  25 25 23   GLUCOSE 100*  --  104* 100* 104*  BUN <5*  --  <5* <5* <5*  CREATININE 0.53*  --  0.69 0.60* 0.67  CALCIUM 8.5*  --  8.5* 8.4* 8.8*  MG  --  1.6* 1.4* 1.7  --   PHOS  --  3.7  --   --   --    Liver Function Tests: No results for input(s): AST, ALT, ALKPHOS, BILITOT, PROT, ALBUMIN in the last 168 hours. CBG: Recent Labs  Lab 05/19/20 0738   GLUCAP 96    Time spent: 35 minutes  Signed:  Berle Mull  [facility] 05/22/2020

## 2020-06-03 ENCOUNTER — Inpatient Hospital Stay: Payer: Medicare HMO | Admitting: Pulmonary Disease

## 2020-06-25 ENCOUNTER — Encounter: Payer: Self-pay | Admitting: Pulmonary Disease

## 2020-06-25 ENCOUNTER — Ambulatory Visit: Payer: Medicare HMO | Admitting: Pulmonary Disease

## 2020-06-25 ENCOUNTER — Other Ambulatory Visit: Payer: Self-pay

## 2020-06-25 VITALS — BP 130/84 | HR 101 | Ht 69.0 in | Wt 152.0 lb

## 2020-06-25 DIAGNOSIS — C3412 Malignant neoplasm of upper lobe, left bronchus or lung: Secondary | ICD-10-CM | POA: Diagnosis not present

## 2020-06-25 DIAGNOSIS — J189 Pneumonia, unspecified organism: Secondary | ICD-10-CM | POA: Diagnosis not present

## 2020-06-25 DIAGNOSIS — J9383 Other pneumothorax: Secondary | ICD-10-CM

## 2020-06-25 DIAGNOSIS — J850 Gangrene and necrosis of lung: Secondary | ICD-10-CM

## 2020-06-25 DIAGNOSIS — J439 Emphysema, unspecified: Secondary | ICD-10-CM

## 2020-06-25 DIAGNOSIS — R042 Hemoptysis: Secondary | ICD-10-CM

## 2020-06-25 MED ORDER — STIOLTO RESPIMAT 2.5-2.5 MCG/ACT IN AERS: 2.0000 | INHALATION_SPRAY | Freq: Every day | RESPIRATORY_TRACT | 0 refills | Status: DC

## 2020-06-25 NOTE — Patient Instructions (Signed)
Thank you for visiting Dr. Valeta Harms at [facility]. Today we recommend the following:  Orders Placed This Encounter  Procedures   CT Chest Wo Contrast   Follow up with me or APP after CT chest complete.   Sample of stiolto   Return in about 3 weeks (around 07/16/2020) for with APP or Dr. Valeta Harms.    Please do your part to reduce the spread of COVID-19.

## 2020-06-25 NOTE — Progress Notes (Signed)
Patient reports that he had 3 Covid vaccines and does not have the card.

## 2020-06-25 NOTE — Progress Notes (Signed)
Synopsis: Referred in June 2022 for hospital follow-up, infected bullous disease, PCP: By [name], MD  Subjective:   PATIENT ID: Terry Page. GENDER: male DOB: April 27, 1955, MRN: [id number]  Chief Complaint  Patient presents with   [facility] follow up 05/18/20. Patient reports doing some better.     This is a 65 year old gentleman, past medical history of alcohol use, bullous emphysema, history of spontaneous pneumothorax on the right, previous chest tube placed by Dr. Roxy Manns.  He also has 2 nodules on the left due to the bullous lung disease did not have biopsy but did receive empiric SBRT treatments by radiation oncology.  He completed these treatments in April of this past year.  Patient was admitted to the hospital in mid April and discharged in May 2022.Patient's hospitalization was related to a right upper lobe pneumonia with associated infectious bullous disease.  Since hospitalization he is doing well.  He was discharged with orders for a repeat noncontrasted CT scan of the chest.  This has not been completed yet.  Her respiratory standpoint he feels short of breath with exertion.  Not on any maintenance inhalers.  No longer having hemoptysis.  Previous CT scan reveals persistence of a spiculated left upper lobe nodule status post SBRT.  This looks stable.  We reviewed his images today in the office.   Past Medical History:  Diagnosis Date   Alcohol abuse    Asthma    Bullous emphysema ([place])    Essential hypertension 04/10/2020   Hemorrhoids    History of radiation therapy 04/08/20-04/19/20   IMRT- Left lung- Dr. Gery Pray   Incidental lung nodule, greater than or equal to 51mm 10/22/2017   Left upper lobe - discovered on CTA   Prostate cancer ([place])    Spontaneous pneumothorax 10/20/2017   right   Tobacco abuse      Family History  Problem Relation Age of Onset   Cancer Cousin        maternal cousin   Cancer Cousin        paternal cousin   Cancer  Cousin    Colon polyps Neg Hx    Pancreatic disease Neg Hx    Pancreatic cancer Neg Hx    Breast cancer Neg Hx    Colon cancer Neg Hx      Past Surgical History:  Procedure Laterality Date   BACK SURGERY     PROSTATE BIOPSY      Social History   Socioeconomic History   Marital status: Single    Spouse name: Not on file   Number of children: Not on file   Years of education: Not on file   Highest education level: Not on file  Occupational History   Occupation: retired  Tobacco Use   Smoking status: Former    Pack years: 0.00    Types: Cigarettes    Quit date: 11/22/2019    Years since quitting: 0.5   Smokeless tobacco: Never   Tobacco comments:    Patient reports quit 3 years ago. 06/25/20. [facility]  Vaping Use   Vaping Use: Never used  Substance and Sexual Activity   Alcohol use: Yes    Alcohol/week: 12.0 standard drinks    Types: 12 Cans of beer per week    Comment: daily   Drug use: No   Sexual activity: Not Currently  Other Topics Concern   Not on file  Social History Narrative   Not on file  Social Determinants of Health   Financial Resource Strain: Not on file  Food Insecurity: Not on file  Transportation Needs: Not on file  Physical Activity: Not on file  Stress: Not on file  Social Connections: Not on file  Intimate Partner Violence: Not on file     No Known Allergies   Outpatient Medications Prior to Visit  Medication Sig Dispense Refill   acetaminophen (TYLENOL) 325 MG tablet Take 2 tablets (650 mg total) by mouth every 6 (six) hours as needed for mild pain or headache (fever >/= 101).     albuterol (VENTOLIN HFA) 108 (90 Base) MCG/ACT inhaler Inhale 2 puffs into the lungs every 6 (six) hours as needed for wheezing or shortness of breath. 8 g 1   atorvastatin (LIPITOR) 20 MG tablet Take 20 mg by mouth every evening.     diphenoxylate-atropine (LOMOTIL) 2.5-0.025 MG tablet Take 1 tablet by mouth 4 (four) times daily as needed for diarrhea or loose  stools. 30 tablet 0   ipratropium-albuterol (DUONEB) 0.5-2.5 (3) MG/3ML SOLN Take 3 mLs by nebulization every 6 (six) hours as needed. (Patient taking differently: Take 3 mLs by nebulization every 6 (six) hours as needed (shortness of breathing or wheezing).) 360 mL 0   losartan (COZAAR) 100 MG tablet Take 100 mg by mouth daily.     polyethylene glycol (MIRALAX / GLYCOLAX) 17 g packet Take 17 g by mouth 2 (two) times daily. 14 each 0   amLODipine (NORVASC) 10 MG tablet Take 1 tablet by mouth daily.     alum & mag hydroxide-simeth (MAALOX/MYLANTA) 200-200-20 MG/5ML suspension Take 15 mLs by mouth every 6 (six) hours as needed for indigestion or heartburn. (Patient not taking: Reported on 06/25/2020) 355 mL 0   benzonatate (TESSALON PERLES) 100 MG capsule Take 1 capsule (100 mg total) by mouth 3 (three) times daily as needed for cough. (Patient not taking: Reported on 06/25/2020) 30 capsule 0   dextromethorphan-guaiFENesin (MUCINEX DM) 30-600 MG 12hr tablet Take 1 tablet by mouth 2 (two) times daily. (Patient not taking: Reported on 06/25/2020) 60 tablet 0   pantoprazole (PROTONIX) 40 MG tablet Take 1 tablet (40 mg total) by mouth 2 (two) times daily for 13 days. 26 tablet 0   No facility-administered medications prior to visit.    Review of Systems  Constitutional:  Negative for chills, fever, malaise/fatigue and weight loss.  HENT:  Negative for hearing loss, sore throat and tinnitus.   Eyes:  Negative for blurred vision and double vision.  Respiratory:  Positive for shortness of breath. Negative for cough, hemoptysis, sputum production, wheezing and stridor.   Cardiovascular:  Negative for chest pain, palpitations, orthopnea, leg swelling and PND.  Gastrointestinal:  Negative for abdominal pain, constipation, diarrhea, heartburn, nausea and vomiting.  Genitourinary:  Negative for dysuria, hematuria and urgency.  Musculoskeletal:  Negative for joint pain and myalgias.  Skin:  Negative for itching  and rash.  Neurological:  Negative for dizziness, tingling, weakness and headaches.  Endo/Heme/Allergies:  Negative for environmental allergies. Does not bruise/bleed easily.  Psychiatric/Behavioral:  Negative for depression. The patient is not nervous/anxious and does not have insomnia.   All other systems reviewed and are negative.   Objective:  Physical Exam Vitals reviewed.  Constitutional:      General: He is not in acute distress.    Appearance: He is well-developed.  HENT:     Head: Normocephalic and atraumatic.     Mouth/Throat:     Pharynx: No oropharyngeal exudate.  Eyes:     Conjunctiva/sclera: Conjunctivae normal.     Pupils: Pupils are equal, round, and reactive to light.  Neck:     Vascular: No JVD.     Trachea: No tracheal deviation.     Comments: Loss of supraclavicular fat Cardiovascular:     Rate and Rhythm: Normal rate and regular rhythm.     Heart sounds: S1 normal and S2 normal.     Comments: Distant heart tones Pulmonary:     Effort: No tachypnea or accessory muscle usage.     Breath sounds: No stridor. Decreased breath sounds (throughout all lung fields) present. No wheezing, rhonchi or rales.  Abdominal:     General: Bowel sounds are normal. There is no distension.     Palpations: Abdomen is soft.     Tenderness: There is no abdominal tenderness.  Musculoskeletal:        General: Deformity (muscle wasting ) present.  Skin:    General: Skin is warm and dry.     Capillary Refill: Capillary refill takes less than 2 seconds.     Findings: No rash.  Neurological:     Mental Status: He is alert and oriented to person, place, and time.  Psychiatric:        Behavior: Behavior normal.     Vitals:   06/25/20 1530  BP: 130/84  Pulse: (!) 101  SpO2: 97%  Weight: 152 lb (68.9 kg)  Height: 5\' 9"  (1.753 m)   97% on RA BMI Readings from Last 3 Encounters:  06/25/20 22.45 kg/m  05/22/20 21.72 kg/m  05/01/20 22.56 kg/m   Wt Readings from Last 3  Encounters:  06/25/20 152 lb (68.9 kg)  05/22/20 147 lb 0.8 oz (66.7 kg)  05/01/20 152 lb 12.5 oz (69.3 kg)    CBC    Component Value Date/Time   WBC 9.3 05/21/2020 1821   RBC 3.24 (L) 05/21/2020 1821   HGB 9.8 (L) 05/22/2020 0105   HCT 28.8 (L) 05/22/2020 0105   PLT 464 (H) 05/21/2020 1821   MCV 83.6 05/21/2020 1821   MCH 28.7 05/21/2020 1821   MCHC 34.3 05/21/2020 1821   RDW 15.5 05/21/2020 1821   LYMPHSABS 0.8 05/18/2020 1746   MONOABS 0.9 05/18/2020 1746   EOSABS 0.2 05/18/2020 1746   BASOSABS 0.0 05/18/2020 1746    Chest Imaging: 04/25/2020: Right upper lobe bullous disease air-fluid levels consistent with fluid collection loculation and likely infected bullae. Left upper lobe spiculated nodule stable. The patient's images have been independently reviewed by me.    Pulmonary Functions Testing Results: PFT Results Latest Ref Rng & Units 03/12/2018  FVC-Pre L 2.49  FVC-Predicted Pre % 65  FVC-Post L 2.87  FVC-Predicted Post % 75  Pre FEV1/FVC % % 51  Post FEV1/FCV % % 51  FEV1-Pre L 1.27  FEV1-Predicted Pre % 43  FEV1-Post L 1.46  DLCO uncorrected ml/min/mmHg 10.43  DLCO UNC% % 39  DLVA Predicted % 62  TLC L 5.95  TLC % Predicted % 87  RV % Predicted % 150    FeNO:   Pathology:   Echocardiogram:   Heart Catheterization:     Assessment & Plan:     ICD-10-CM   1. Pneumonia of right upper lobe due to infectious organism  J18.9 CT Chest Wo Contrast    2. Bullous emphysema ([name])  J43.9 CT Chest Wo Contrast    3. Primary cancer of left upper lobe of lung ([place])  C34.12  4. Hemoptysis  R04.2     5. Spontaneous pneumothorax  J93.83     6. Necrotizing pneumonia ([place])  J85.0       Discussion:  65 year old gentleman recent hospitalization looks like he had a necrotizing pneumonia/infected bullous disease.  Was treated with a course of antibiotics.  Had hemoptysis at the time.  He also has known left upper lobe lung cancer status post SBRT empiric  treatments has not had biopsy.  Hemoptysis was also present on admission this is now resolved.  Plan: Due to everything has been going on in his chest I think he needs a repeat noncontrasted CT imaging.  His last axial imaging was in April. We will start by ordering a noncontrasted CT scan of the chest. Patient can follow-up with [place] after this is complete. Significant mount of time today in the office reviewing hospitalization records, discharge summary as well as radiation oncology office visits.  He likely has COPD.  He does have very severe bullous disease.  He is not on any maintenance inhalers.  Patient was given samples of Stiolto today in the office.  Patient was instructed on how to use inhaler prior to leaving.     Current Outpatient Medications:    acetaminophen (TYLENOL) 325 MG tablet, Take 2 tablets (650 mg total) by mouth every 6 (six) hours as needed for mild pain or headache (fever >/= 101)., Disp: , Rfl:    albuterol (VENTOLIN HFA) 108 (90 Base) MCG/ACT inhaler, Inhale 2 puffs into the lungs every 6 (six) hours as needed for wheezing or shortness of breath., Disp: 8 g, Rfl: 1   atorvastatin (LIPITOR) 20 MG tablet, Take 20 mg by mouth every evening., Disp: , Rfl:    diphenoxylate-atropine (LOMOTIL) 2.5-0.025 MG tablet, Take 1 tablet by mouth 4 (four) times daily as needed for diarrhea or loose stools., Disp: 30 tablet, Rfl: 0   ipratropium-albuterol (DUONEB) 0.5-2.5 (3) MG/3ML SOLN, Take 3 mLs by nebulization every 6 (six) hours as needed. (Patient taking differently: Take 3 mLs by nebulization every 6 (six) hours as needed (shortness of breathing or wheezing).), Disp: 360 mL, Rfl: 0   losartan (COZAAR) 100 MG tablet, Take 100 mg by mouth daily., Disp: , Rfl:    polyethylene glycol (MIRALAX / GLYCOLAX) 17 g packet, Take 17 g by mouth 2 (two) times daily., Disp: 14 each, Rfl: 0   amLODipine (NORVASC) 10 MG tablet, Take 1 tablet by mouth daily., Disp: , Rfl:   I spent 42 minutes  dedicated to the care of this patient on the date of this encounter to include pre-visit review of records, face-to-face time with the patient discussing conditions above, post visit ordering of testing, clinical documentation with the electronic health record, making appropriate referrals as documented, and communicating necessary findings to members of the patients care team.   [name], DO [facility] 06/25/2020 3:53 PM

## 2020-09-13 ENCOUNTER — Other Ambulatory Visit: Payer: Self-pay

## 2020-09-13 ENCOUNTER — Ambulatory Visit (INDEPENDENT_AMBULATORY_CARE_PROVIDER_SITE_OTHER)
Admission: RE | Admit: 2020-09-13 | Discharge: 2020-09-13 | Disposition: A | Discharge status: Home / Self Care | Payer: Medicare HMO | Source: Ambulatory Visit | Attending: Pulmonary Disease | Admitting: Pulmonary Disease

## 2020-09-13 DIAGNOSIS — J439 Emphysema, unspecified: Secondary | ICD-10-CM

## 2020-09-13 DIAGNOSIS — J189 Pneumonia, unspecified organism: Secondary | ICD-10-CM

## 2020-09-14 ENCOUNTER — Telehealth: Payer: Self-pay | Admitting: Pulmonary Disease

## 2020-09-14 NOTE — Telephone Encounter (Signed)
Call report from [facility] for patient. Will route to Dr. Valeta Harms  IMPRESSION: 1. Interval increase in size of two spiculated left upper lobe pulmonary nodules with associated tethering of the pleura: These measure up to 1.7 and 1.9 cm on coronal imaging. These are highly suspicious for malignancy. Markedly limited evaluation for hilar lymphadenopathy on this noncontrast study. Additional imaging evaluation or consultation with Pulmonology or Thoracic Surgery recommended. 2. Interval resolution of right upper lobe consolidation as well as right middle lobe nodular lesion. 3. Emphysema (ICD10-J43.9) - severe. 4. Pulmonary hypertension. 5. Aortic Atherosclerosis (ICD10-I70.0) including four-vessel coronary artery calcifications. 6. Myocardial changes suggestive of prior myocardial infarction.   These results will be called to the ordering clinician or representative by the Radiologist Assistant, and communication documented in the PACS or [employer].     Electronically Signed   By: Iven Finn M.D.   On: 09/13/2020 23:33

## 2020-09-14 NOTE — Telephone Encounter (Signed)
I have called the pt and LM on VM for him to call back.  BI stated that he can be scheduled for 10/11/20 in a consult spot.

## 2020-10-07 NOTE — Telephone Encounter (Signed)
Appt 10/11/20  Nothing further needed at this time.

## 2020-10-11 ENCOUNTER — Ambulatory Visit: Payer: Medicare HMO | Admitting: Pulmonary Disease

## 2020-10-11 ENCOUNTER — Encounter: Payer: Self-pay | Admitting: Pulmonary Disease

## 2020-10-11 ENCOUNTER — Other Ambulatory Visit: Payer: Self-pay

## 2020-10-11 VITALS — BP 124/70 | HR 102 | Temp 98.0°F | Ht 69.0 in | Wt 169.0 lb

## 2020-10-11 DIAGNOSIS — J439 Emphysema, unspecified: Secondary | ICD-10-CM

## 2020-10-11 DIAGNOSIS — R918 Other nonspecific abnormal finding of lung field: Secondary | ICD-10-CM

## 2020-10-11 DIAGNOSIS — J850 Gangrene and necrosis of lung: Secondary | ICD-10-CM

## 2020-10-11 DIAGNOSIS — C3412 Malignant neoplasm of upper lobe, left bronchus or lung: Secondary | ICD-10-CM

## 2020-10-11 DIAGNOSIS — J9383 Other pneumothorax: Secondary | ICD-10-CM

## 2020-10-11 NOTE — Progress Notes (Signed)
Synopsis: Referred in June 2022 for hospital follow-up, infected bullous disease, PCP: By [name], MD  Subjective:   PATIENT ID: Terry Page. GENDER: male DOB: May 19, 1955, MRN: [id number]  Chief Complaint  Patient presents with   Follow-up    This is a 65 year old gentleman, past medical history of alcohol use, bullous emphysema, history of spontaneous pneumothorax on the right, previous chest tube placed by Dr. Roxy Manns.  He also has 2 nodules on the left due to the bullous lung disease did not have biopsy but did receive empiric SBRT treatments by radiation oncology.  He completed these treatments in April of this past year.  Patient was admitted to the hospital in mid April and discharged in May 2022.Patient's hospitalization was related to a right upper lobe pneumonia with associated infectious bullous disease.  Since hospitalization he is doing well.  He was discharged with orders for a repeat noncontrasted CT scan of the chest.  This has not been completed yet.  Her respiratory standpoint he feels short of breath with exertion.  Not on any maintenance inhalers.  No longer having hemoptysis.  Previous CT scan reveals persistence of a spiculated left upper lobe nodule status post SBRT.  This looks stable.  We reviewed his images today in the office.  OV 10/11/2020: Here today for follow-up after recent CT scan of the chest.  He was treated empirically left upper lobe 2 pulmonary lesions concerning for malignancy no previous biopsy or diagnosis of malignancy.  He has the area that has slowly enlarged some within the left upper lobe.  Reviewed CT scan today in the office.   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 Gery Pray   Incidental lung nodule, greater than or equal to 33mm 10/22/2017   Left upper  lobe - discovered on CTA   Prostate cancer ([place])    Spontaneous pneumothorax 10/20/2017   right   Tobacco abuse      Family History  Problem Relation Age of Onset   Cancer Cousin        maternal cousin   Cancer Cousin        paternal cousin   Cancer Cousin    Colon polyps Neg Hx    Pancreatic disease Neg Hx    Pancreatic cancer Neg Hx    Breast cancer Neg Hx    Colon cancer Neg Hx      Past Surgical History:  Procedure Laterality Date   BACK SURGERY     PROSTATE BIOPSY      Social History   Socioeconomic History   Marital status: Single    Spouse name: Not on file   Number of children: Not on file   Years of education: Not on file   Highest education level: Not on file  Occupational History   Occupation: retired  Tobacco Use   Smoking status: Former    Types: Cigarettes    Quit date: 11/22/2019    Years since quitting: 0.8   Smokeless tobacco: Never   Tobacco comments:    Patient reports quit 3 years ago. 06/25/20. [facility]  Vaping Use   Vaping Use: Never used  Substance and Sexual Activity   Alcohol use: Yes    Alcohol/week: 12.0 standard drinks    Types: 12 Cans of  beer per week    Comment: daily   Drug use: No   Sexual activity: Not Currently  Other Topics Concern   Not on file  Social History Narrative   Not on file   Social Determinants of Health   Financial Resource Strain: Not on file  Food Insecurity: Not on file  Transportation Needs: Not on file  Physical Activity: Not on file  Stress: Not on file  Social Connections: Not on file  Intimate Partner Violence: Not on file     No Known Allergies   Outpatient Medications Prior to Visit  Medication Sig Dispense Refill   acetaminophen (TYLENOL) 325 MG tablet Take 2 tablets (650 mg total) by mouth every 6 (six) hours as needed for mild pain or headache (fever >/= 101).     amLODipine (NORVASC) 10 MG tablet Take 1 tablet by mouth daily.     atorvastatin (LIPITOR) 20 MG tablet Take 20 mg by mouth  every evening.     diphenoxylate-atropine (LOMOTIL) 2.5-0.025 MG tablet Take 1 tablet by mouth 4 (four) times daily as needed for diarrhea or loose stools. 30 tablet 0   ipratropium-albuterol (DUONEB) 0.5-2.5 (3) MG/3ML SOLN Take 3 mLs by nebulization every 6 (six) hours as needed. (Patient taking differently: Take 3 mLs by nebulization every 6 (six) hours as needed (shortness of breathing or wheezing).) 360 mL 0   losartan (COZAAR) 100 MG tablet Take 100 mg by mouth daily.     polyethylene glycol (MIRALAX / GLYCOLAX) 17 g packet Take 17 g by mouth 2 (two) times daily. 14 each 0   Tiotropium Bromide-Olodaterol (STIOLTO RESPIMAT) 2.5-2.5 MCG/ACT AERS Inhale 2 puffs into the lungs daily. 4 g 0   albuterol (VENTOLIN HFA) 108 (90 Base) MCG/ACT inhaler Inhale 2 puffs into the lungs every 6 (six) hours as needed for wheezing or shortness of breath. 8 g 1   No facility-administered medications prior to visit.    Review of Systems  Constitutional:  Negative for chills, fever, malaise/fatigue and weight loss.  HENT:  Negative for hearing loss, sore throat and tinnitus.   Eyes:  Negative for blurred vision and double vision.  Respiratory:  Positive for shortness of breath. Negative for cough, hemoptysis, sputum production, wheezing and stridor.   Cardiovascular:  Negative for chest pain, palpitations, orthopnea, leg swelling and PND.  Gastrointestinal:  Negative for abdominal pain, constipation, diarrhea, heartburn, nausea and vomiting.  Genitourinary:  Negative for dysuria, hematuria and urgency.  Musculoskeletal:  Negative for joint pain and myalgias.  Skin:  Negative for itching and rash.  Neurological:  Negative for dizziness, tingling, weakness and headaches.  Endo/Heme/Allergies:  Negative for environmental allergies. Does not bruise/bleed easily.  Psychiatric/Behavioral:  Negative for depression. The patient is not nervous/anxious and does not have insomnia.   All other systems reviewed and are  negative.   Objective:  Physical Exam Vitals reviewed.  Constitutional:      General: He is not in acute distress.    Appearance: He is well-developed.  HENT:     Head: Normocephalic and atraumatic.  Eyes:     General: No scleral icterus.    Conjunctiva/sclera: Conjunctivae normal.     Pupils: Pupils are equal, round, and reactive to light.  Neck:     Vascular: No JVD.     Trachea: No tracheal deviation.  Cardiovascular:     Rate and Rhythm: Normal rate and regular rhythm.     Heart sounds: Normal heart sounds. No murmur heard. Pulmonary:  Effort: Pulmonary effort is normal. No tachypnea, accessory muscle usage or respiratory distress.     Breath sounds: No stridor. No wheezing, rhonchi or rales.  Abdominal:     General: Bowel sounds are normal. There is no distension.     Palpations: Abdomen is soft.     Tenderness: There is no abdominal tenderness.  Musculoskeletal:        General: No tenderness.     Cervical back: Neck supple.  Lymphadenopathy:     Cervical: No cervical adenopathy.  Skin:    General: Skin is warm and dry.     Capillary Refill: Capillary refill takes less than 2 seconds.     Findings: No rash.  Neurological:     Mental Status: He is alert and oriented to person, place, and time.  Psychiatric:        Behavior: Behavior normal.     Vitals:   10/11/20 1131  BP: 124/70  Pulse: (!) 102  Temp: 98 F (36.7 C)  TempSrc: Oral  SpO2: 95%  Weight: 169 lb (76.7 kg)  Height: 5\' 9"  (1.753 m)   95% on RA BMI Readings from Last 3 Encounters:  10/11/20 24.96 kg/m  06/25/20 22.45 kg/m  05/22/20 21.72 kg/m   Wt Readings from Last 3 Encounters:  10/11/20 169 lb (76.7 kg)  06/25/20 152 lb (68.9 kg)  05/22/20 147 lb 0.8 oz (66.7 kg)    CBC    Component Value Date/Time   WBC 9.3 05/21/2020 1821   RBC 3.24 (L) 05/21/2020 1821   HGB 9.8 (L) 05/22/2020 0105   HCT 28.8 (L) 05/22/2020 0105   PLT 464 (H) 05/21/2020 1821   MCV 83.6 05/21/2020 1821    MCH 28.7 05/21/2020 1821   MCHC 34.3 05/21/2020 1821   RDW 15.5 05/21/2020 1821   LYMPHSABS 0.8 05/18/2020 1746   MONOABS 0.9 05/18/2020 1746   EOSABS 0.2 05/18/2020 1746   BASOSABS 0.0 05/18/2020 1746    Chest Imaging: 04/25/2020: Right upper lobe bullous disease air-fluid levels consistent with fluid collection loculation and likely infected bullae. Left upper lobe spiculated nodule stable. The patient's images have been independently reviewed by me.    September 2022 CT chest: Patient has enlarging areas within the left upper lobe that was previously treated for SBRT. Unsure if this is recurrence of malignancy or not still has significant bullous emphysema. The patient's images have been independently reviewed by me.    Pulmonary Functions Testing Results: PFT Results Latest Ref Rng & Units 03/12/2018  FVC-Pre L 2.49  FVC-Predicted Pre % 65  FVC-Post L 2.87  FVC-Predicted Post % 75  Pre FEV1/FVC % % 51  Post FEV1/FCV % % 51  FEV1-Pre L 1.27  FEV1-Predicted Pre % 43  FEV1-Post L 1.46  DLCO uncorrected ml/min/mmHg 10.43  DLCO UNC% % 39  DLVA Predicted % 62  TLC L 5.95  TLC % Predicted % 87  RV % Predicted % 150    FeNO:   Pathology:   Echocardiogram:   Heart Catheterization:     Assessment & Plan:     ICD-10-CM   1. 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 Lung nodules  R91.8 CT Super D Chest Wo Contrast    3. 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 Necrotizing pneumonia ([place])  J85.0     5. Spontaneous pneumothorax  J93.83       Discussion:  This is a 65 year old gentleman, previous hospitalization for necrotizing pneumonia significant bullous emphysema.  He had empiric SBRT to 2 left upper lobe pulmonary nodules with no biopsy concerning for malignancy  Now he has CT imaging with lesions that have slightly enlarged in the left upper lobe.  Unclear if this is postradiation changes or  malignancy.  Plan: Discussed this today in the office reviewed images as well. I will discuss with medical and radiation oncology about appropriate neck steps. I am leaning towards repeat follow-up imaging. If the images show persistent enlargement of the left upper lobe lesions then he probably needs to undergo biopsy if he has recurrence of disease at this point and consideration for systemic treatment. Patient is agreeable to this plan. Repeat noncontrasted CT scan in 3 months Follow-up with me in 3 months and then we can make a decision on whether or not he needs a biopsy.  Continue Stiolto for his emphysema.     Current Outpatient Medications:    acetaminophen (TYLENOL) 325 MG tablet, Take 2 tablets (650 mg total) by mouth every 6 (six) hours as needed for mild pain or headache (fever >/= 101)., Disp: , Rfl:    amLODipine (NORVASC) 10 MG tablet, Take 1 tablet by mouth daily., Disp: , Rfl:    atorvastatin (LIPITOR) 20 MG tablet, Take 20 mg by mouth every evening., Disp: , Rfl:    diphenoxylate-atropine (LOMOTIL) 2.5-0.025 MG tablet, Take 1 tablet by mouth 4 (four) times daily as needed for diarrhea or loose stools., Disp: 30 tablet, Rfl: 0   ipratropium-albuterol (DUONEB) 0.5-2.5 (3) MG/3ML SOLN, Take 3 mLs by nebulization every 6 (six) hours as needed. (Patient taking differently: Take 3 mLs by nebulization every 6 (six) hours as needed (shortness of breathing or wheezing).), Disp: 360 mL, Rfl: 0   losartan (COZAAR) 100 MG tablet, Take 100 mg by mouth daily., Disp: , Rfl:    polyethylene glycol (MIRALAX / GLYCOLAX) 17 g packet, Take 17 g by mouth 2 (two) times daily., Disp: 14 each, Rfl: 0   Tiotropium Bromide-Olodaterol (STIOLTO RESPIMAT) 2.5-2.5 MCG/ACT AERS, Inhale 2 puffs into the lungs daily., Disp: 4 g, Rfl: 0   albuterol (VENTOLIN HFA) 108 (90 Base) MCG/ACT inhaler, Inhale 2 puffs into the lungs every 6 (six) hours as needed for wheezing or shortness of breath., Disp: 8 g, Rfl:  1    [name], DO [facility] 10/11/2020 11:41 AM

## 2020-10-11 NOTE — Patient Instructions (Addendum)
Thank you for visiting Dr. Valeta Harms at [facility]. Today we recommend the following:  Orders Placed This Encounter  Procedures   CT Super D Chest Wo Contrast   Repeat CT chest in 3 months  I will discuss with Dr. Sondra Come and Dr. Julien Nordmann  Return in about 3 months (around 01/11/2021) for with APP or Dr. Valeta Harms.    Please do your part to reduce the spread of COVID-19.

## 2020-10-12 ENCOUNTER — Other Ambulatory Visit: Payer: Self-pay | Admitting: Pulmonary Disease

## 2020-10-12 DIAGNOSIS — C3412 Malignant neoplasm of upper lobe, left bronchus or lung: Secondary | ICD-10-CM

## 2020-10-12 DIAGNOSIS — R918 Other nonspecific abnormal finding of lung field: Secondary | ICD-10-CM

## 2020-10-12 NOTE — Addendum Note (Signed)
Addended by: [name] on: 10/12/2020 07:18 AM   Modules accepted: Orders

## 2020-10-13 ENCOUNTER — Telehealth: Payer: Self-pay

## 2020-10-13 NOTE — Telephone Encounter (Signed)
-----   Message from [name], DO sent at 10/12/2020  7:18 AM EDT ----- Please let patient know that I discussed case with oncology and they recommended a PET scan. Once we get the PET scan we can discuss need for biopsy.  Thanks, BLI

## 2020-10-13 NOTE — Telephone Encounter (Signed)
Called and spoke with patient to go over message from Dr. Valeta Harms about recommendations for PET scan. Saw that it has already been scheduled. Provided patient with information and he stated that 8 am is to early and would like it to be changed.   PCC's please advise

## 2020-10-15 NOTE — Telephone Encounter (Addendum)
Pt has been rescheduled to 10:00 on 10/20 with 9:30 arrival time.  Gave appt info to pt.  Nothing further needed.

## 2020-10-19 ENCOUNTER — Ambulatory Visit (HOSPITAL_COMMUNITY): Payer: Medicare HMO

## 2020-10-21 ENCOUNTER — Ambulatory Visit (HOSPITAL_COMMUNITY): Admission: RE | Admit: 2020-10-21 | Payer: Medicare HMO | Source: Ambulatory Visit

## 2020-10-21 ENCOUNTER — Ambulatory Visit (HOSPITAL_COMMUNITY): Payer: Medicare HMO

## 2020-10-28 ENCOUNTER — Telehealth: Payer: Medicare HMO | Admitting: Pulmonary Disease

## 2021-01-11 ENCOUNTER — Other Ambulatory Visit: Payer: Medicare HMO

## 2021-01-14 ENCOUNTER — Ambulatory Visit: Payer: Medicare HMO | Admitting: Pulmonary Disease

## 2021-01-14 NOTE — Progress Notes (Incomplete)
Synopsis: Referred in June 2022 for hospital follow-up, infected bullous disease, PCP: By Terry Macadam, MD  Subjective:   PATIENT ID: Terry Page. GENDER: male DOB: 11/10/1955, MRN: [id number]  No chief complaint on file.   [name], past medical history of alcohol use, bullous emphysema, history of spontaneous pneumothorax on the right, previous chest tube placed by Dr. Roxy Manns.  He also has 2 nodules on the left due to the bullous lung disease did not have biopsy but did receive empiric SBRT treatments by radiation oncology.  He completed these treatments in April of this past year.  Patient was admitted to the hospital in mid April and discharged in May 2022.Patient's hospitalization was related to a right upper lobe pneumonia with associated infectious bullous disease.  Since hospitalization he is doing well.  He was discharged with orders for a repeat noncontrasted CT scan of the chest.  This has not been completed yet.  Her respiratory standpoint he feels short of breath with exertion.  Not on any maintenance inhalers.  No longer having hemoptysis.  Previous CT scan reveals persistence of a spiculated left upper lobe nodule status post SBRT.  This looks stable.  We reviewed his images today in the office.  OV 10/11/2020: Here today for follow-up after recent CT scan of the chest.  He was treated empirically left upper lobe 2 pulmonary lesions concerning for malignancy no previous biopsy or diagnosis of malignancy.  He has the area that has slowly enlarged some within the left upper lobe.  Reviewed CT scan today in the office.   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 Gery Pray   Incidental lung nodule, greater than or equal to 46mm 10/22/2017   Left upper lobe - discovered on  CTA   Prostate cancer ([place])    Spontaneous pneumothorax 10/20/2017   right   Tobacco abuse      Family History  Problem Relation Age of Onset   Cancer Cousin        maternal cousin   Cancer Cousin        paternal cousin   Cancer Cousin    Colon polyps Neg Hx    Pancreatic disease Neg Hx    Pancreatic cancer Neg Hx    Breast cancer Neg Hx    Colon cancer Neg Hx      Past Surgical History:  Procedure Laterality Date   BACK SURGERY     PROSTATE BIOPSY      Social History   Socioeconomic History   Marital status: Single    Spouse name: Not on file   Number of children: Not on file   Years of education: Not on file   Highest education level: Not on file  Occupational History   Occupation: retired  Tobacco Use   Smoking status: Former    Types: Cigarettes    Quit date: 11/22/2019    Years since quitting: 1.1   Smokeless tobacco: Never   Tobacco comments:    Patient reports quit 3 years ago. 06/25/20. HSM  Vaping Use   Vaping Use: Never used  Substance and Sexual Activity   Alcohol use: Yes    Alcohol/week: 12.0 standard drinks    Types: 12 Cans of beer per week  Comment: daily   Drug use: No   Sexual activity: Not Currently  Other Topics Concern   Not on file  Social History Narrative   Not on file   Social Determinants of Health   Financial Resource Strain: Not on file  Food Insecurity: Not on file  Transportation Needs: Not on file  Physical Activity: Not on file  Stress: Not on file  Social Connections: Not on file  Intimate Partner Violence: Not on file     No Known Allergies   Outpatient Medications Prior to Visit  Medication Sig Dispense Refill   acetaminophen (TYLENOL) 325 MG tablet Take 2 tablets (650 mg total) by mouth every 6 (six) hours as needed for mild pain or headache (fever >/= 101).     albuterol (VENTOLIN HFA) 108 (90 Base) MCG/ACT inhaler Inhale 2 puffs into the lungs every 6 (six) hours as needed  for wheezing or shortness of breath. 8 g 1   amLODipine (NORVASC) 10 MG tablet Take 1 tablet by mouth daily.     atorvastatin (LIPITOR) 20 MG tablet Take 20 mg by mouth every evening.     diphenoxylate-atropine (LOMOTIL) 2.5-0.025 MG tablet Take 1 tablet by mouth 4 (four) times daily as needed for diarrhea or loose stools. 30 tablet 0   ipratropium-albuterol (DUONEB) 0.5-2.5 (3) MG/3ML SOLN Take 3 mLs by nebulization every 6 (six) hours as needed. (Patient taking differently: Take 3 mLs by nebulization every 6 (six) hours as needed (shortness of breathing or wheezing).) 360 mL 0   losartan (COZAAR) 100 MG tablet Take 100 mg by mouth daily.     polyethylene glycol (MIRALAX / GLYCOLAX) 17 g packet Take 17 g by mouth 2 (two) times daily. 14 each 0   Tiotropium Bromide-Olodaterol (STIOLTO RESPIMAT) 2.5-2.5 MCG/ACT AERS Inhale 2 puffs into the lungs daily. 4 g 0   No facility-administered medications prior to visit.    Review of Systems  Constitutional:  Negative for chills, fever, malaise/fatigue and weight loss.  HENT:  Negative for hearing loss, sore throat and tinnitus.   Eyes:  Negative for blurred vision and double vision.  Respiratory:  Positive for shortness of breath. Negative for cough, hemoptysis, sputum production, wheezing and stridor.   Cardiovascular:  Negative for chest pain, palpitations, orthopnea, leg swelling and PND.  Gastrointestinal:  Negative for abdominal pain, constipation, diarrhea, heartburn, nausea and vomiting.  Genitourinary:  Negative for dysuria, hematuria and urgency.  Musculoskeletal:  Negative for joint pain and myalgias.  Skin:  Negative for itching and rash.  Neurological:  Negative for dizziness, tingling, weakness and headaches.  Endo/Heme/Allergies:  Negative for environmental allergies. Does not bruise/bleed easily.  Psychiatric/Behavioral:  Negative for depression. The patient is not nervous/anxious and does not have insomnia.   All other systems  reviewed and are negative.   Objective:  Physical Exam Vitals reviewed.  Constitutional:      General: He is not in acute distress.    Appearance: He is well-developed.  HENT:     Head: Normocephalic and atraumatic.  Eyes:     General: No scleral icterus.    Conjunctiva/sclera: Conjunctivae normal.     Pupils: Pupils are equal, round, and reactive to light.  Neck:     Vascular: No JVD.     Trachea: No tracheal deviation.  Cardiovascular:     Rate and Rhythm: Normal rate and regular rhythm.     Heart sounds: Normal heart sounds. No murmur heard. Pulmonary:     Effort: Pulmonary effort  is normal. No tachypnea, accessory muscle usage or respiratory distress.     Breath sounds: No stridor. No wheezing, rhonchi or rales.  Abdominal:     General: Bowel sounds are normal. There is no distension.     Palpations: Abdomen is soft.     Tenderness: There is no abdominal tenderness.  Musculoskeletal:        General: No tenderness.     Cervical back: Neck supple.  Lymphadenopathy:     Cervical: No cervical adenopathy.  Skin:    General: Skin is warm and dry.     Capillary Refill: Capillary refill takes less than 2 seconds.     Findings: No rash.  Neurological:     Mental Status: He is alert and oriented to person, place, and time.  Psychiatric:        Behavior: Behavior normal.     There were no vitals filed for this visit.    on RA BMI Readings from Last 3 Encounters:  10/11/20 24.96 kg/m  06/25/20 22.45 kg/m  05/22/20 21.72 kg/m   Wt Readings from Last 3 Encounters:  10/11/20 169 lb (76.7 kg)  06/25/20 152 lb (68.9 kg)  05/22/20 147 lb 0.8 oz (66.7 kg)    CBC    Component Value Date/Time   WBC 9.3 05/21/2020 1821   RBC 3.24 (L) 05/21/2020 1821   HGB 9.8 (L) 05/22/2020 0105   HCT 28.8 (L) 05/22/2020 0105   PLT 464 (H) 05/21/2020 1821   MCV 83.6 05/21/2020 1821   MCH 28.7 05/21/2020 1821   MCHC 34.3 05/21/2020 1821   RDW 15.5 05/21/2020 1821   LYMPHSABS 0.8  05/18/2020 1746   MONOABS 0.9 05/18/2020 1746   EOSABS 0.2 05/18/2020 1746   BASOSABS 0.0 05/18/2020 1746    Chest Imaging: 04/25/2020: Right upper lobe bullous disease air-fluid levels consistent with fluid collection loculation and likely infected bullae. Left upper lobe spiculated nodule stable. The patient's images have been independently reviewed by me.    September 2022 CT chest: Patient has enlarging areas within the left upper lobe that was previously treated for SBRT. Unsure if this is recurrence of malignancy or not still has significant bullous emphysema. The patient's images have been independently reviewed by me.    Pulmonary Functions Testing Results: PFT Results Latest Ref Rng & Units 03/12/2018  FVC-Pre L 2.49  FVC-Predicted Pre % 65  FVC-Post L 2.87  FVC-Predicted Post % 75  Pre FEV1/FVC % % 51  Post FEV1/FCV % % 51  FEV1-Pre L 1.27  FEV1-Predicted Pre % 43  FEV1-Post L 1.46  DLCO uncorrected ml/min/mmHg 10.43  DLCO UNC% % 39  DLVA Predicted % 62  TLC L 5.95  TLC % Predicted % 87  RV % Predicted % 150    FeNO:   Pathology:   Echocardiogram:   Heart Catheterization:     Assessment & Plan:   No diagnosis found.   Discussion:  [name], previous hospitalization for necrotizing pneumonia significant bullous emphysema.  He had empiric SBRT to 2 left upper lobe pulmonary nodules with no biopsy concerning for malignancy  Now he has CT imaging with lesions that have slightly enlarged in the left upper lobe.  Unclear if this is postradiation changes or malignancy.  Plan: Discussed this today in the office reviewed images as well. I will discuss with medical and radiation oncology about appropriate neck steps. I am leaning towards repeat follow-up imaging. If the images show persistent enlargement of the  left upper lobe lesions then he probably needs to undergo biopsy if he has recurrence of disease at this point and consideration  for systemic treatment. Patient is agreeable to this plan. Repeat noncontrasted CT scan in 3 months Follow-up with me in 3 months and then we can make a decision on whether or not he needs a biopsy.  Continue Stiolto for his emphysema.     Current Outpatient Medications:    acetaminophen (TYLENOL) 325 MG tablet, Take 2 tablets (650 mg total) by mouth every 6 (six) hours as needed for mild pain or headache (fever >/= 101)., Disp: , Rfl:    albuterol (VENTOLIN HFA) 108 (90 Base) MCG/ACT inhaler, Inhale 2 puffs into the lungs every 6 (six) hours as needed for wheezing or shortness of breath., Disp: 8 g, Rfl: 1   amLODipine (NORVASC) 10 MG tablet, Take 1 tablet by mouth daily., Disp: , Rfl:    atorvastatin (LIPITOR) 20 MG tablet, Take 20 mg by mouth every evening., Disp: , Rfl:    diphenoxylate-atropine (LOMOTIL) 2.5-0.025 MG tablet, Take 1 tablet by mouth 4 (four) times daily as needed for diarrhea or loose stools., Disp: 30 tablet, Rfl: 0   ipratropium-albuterol (DUONEB) 0.5-2.5 (3) MG/3ML SOLN, Take 3 mLs by nebulization every 6 (six) hours as needed. (Patient taking differently: Take 3 mLs by nebulization every 6 (six) hours as needed (shortness of breathing or wheezing).), Disp: 360 mL, Rfl: 0   losartan (COZAAR) 100 MG tablet, Take 100 mg by mouth daily., Disp: , Rfl:    polyethylene glycol (MIRALAX / GLYCOLAX) 17 g packet, Take 17 g by mouth 2 (two) times daily., Disp: 14 each, Rfl: 0   Tiotropium Bromide-Olodaterol (STIOLTO RESPIMAT) 2.5-2.5 MCG/ACT AERS, Inhale 2 puffs into the lungs daily., Disp: 4 g, Rfl: 0    [name], DO [facility] 01/14/2021 9:28 AM

## 2021-02-02 ENCOUNTER — Other Ambulatory Visit: Payer: Medicare HMO

## 2021-02-03 ENCOUNTER — Other Ambulatory Visit: Payer: Self-pay

## 2021-02-03 ENCOUNTER — Ambulatory Visit
Admission: RE | Admit: 2021-02-03 | Discharge: 2021-02-03 | Disposition: A | Discharge status: Home / Self Care | Payer: Medicare HMO | Source: Ambulatory Visit | Attending: Pulmonary Disease | Admitting: Pulmonary Disease

## 2021-02-03 DIAGNOSIS — R918 Other nonspecific abnormal finding of lung field: Secondary | ICD-10-CM

## 2021-02-03 DIAGNOSIS — C3412 Malignant neoplasm of upper lobe, left bronchus or lung: Secondary | ICD-10-CM

## 2021-02-09 ENCOUNTER — Telehealth: Payer: Self-pay | Admitting: Pulmonary Disease

## 2021-02-09 DIAGNOSIS — R918 Other nonspecific abnormal finding of lung field: Secondary | ICD-10-CM

## 2021-02-10 NOTE — Telephone Encounter (Signed)
Pt calling requesting to know the results of recent CT. Dr. Valeta Harms, please advise.

## 2021-02-11 NOTE — Telephone Encounter (Signed)
Called and spoke with patient. He verbalized understanding of results and wishes to proceed with another CT in 6 months. He had no further questions and did not feel he needed an appt.   Order has been placed for CT scan. Recall placed for 6 mo f/u.   Nothing further needed at time of call.

## 2021-02-11 NOTE — Telephone Encounter (Signed)
Called patient but call went straight to VM. Left message for him to call us back.

## 2021-03-13 NOTE — Progress Notes (Signed)
Terry Page,  Please tell patient I have reviewed his CT images. The nodules are stable and still present. Please order a repeat CT Super D Chest WO Contrast in 12 months.   Thanks,  BLI  [name], DO [facility] 03/13/2021 7:59 AM

## 2021-03-15 ENCOUNTER — Other Ambulatory Visit: Payer: Self-pay

## 2021-03-15 DIAGNOSIS — R918 Other nonspecific abnormal finding of lung field: Secondary | ICD-10-CM

## 2021-04-27 ENCOUNTER — Encounter (HOSPITAL_COMMUNITY): Payer: Self-pay | Admitting: Oncology

## 2021-04-27 ENCOUNTER — Inpatient Hospital Stay (HOSPITAL_COMMUNITY)
Admission: EM | Admit: 2021-04-27 | Discharge: 2021-05-07 | DRG: 478 | Disposition: A | Discharge status: Skilled Nursing Facility | Payer: Medicare HMO | Attending: Internal Medicine | Admitting: Internal Medicine

## 2021-04-27 ENCOUNTER — Other Ambulatory Visit: Payer: Self-pay

## 2021-04-27 ENCOUNTER — Emergency Department (HOSPITAL_COMMUNITY): Payer: Medicare HMO

## 2021-04-27 DIAGNOSIS — F1010 Alcohol abuse, uncomplicated: Secondary | ICD-10-CM | POA: Diagnosis present

## 2021-04-27 DIAGNOSIS — E785 Hyperlipidemia, unspecified: Secondary | ICD-10-CM

## 2021-04-27 DIAGNOSIS — E875 Hyperkalemia: Secondary | ICD-10-CM | POA: Diagnosis present

## 2021-04-27 DIAGNOSIS — W19XXXA Unspecified fall, initial encounter: Secondary | ICD-10-CM | POA: Diagnosis present

## 2021-04-27 DIAGNOSIS — M549 Dorsalgia, unspecified: Secondary | ICD-10-CM

## 2021-04-27 DIAGNOSIS — Z9181 History of falling: Secondary | ICD-10-CM

## 2021-04-27 DIAGNOSIS — C61 Malignant neoplasm of prostate: Secondary | ICD-10-CM | POA: Diagnosis present

## 2021-04-27 DIAGNOSIS — R296 Repeated falls: Secondary | ICD-10-CM

## 2021-04-27 DIAGNOSIS — R7989 Other specified abnormal findings of blood chemistry: Secondary | ICD-10-CM

## 2021-04-27 DIAGNOSIS — F1090 Alcohol use, unspecified, uncomplicated: Secondary | ICD-10-CM

## 2021-04-27 DIAGNOSIS — E871 Hypo-osmolality and hyponatremia: Secondary | ICD-10-CM | POA: Diagnosis present

## 2021-04-27 DIAGNOSIS — Z79899 Other long term (current) drug therapy: Secondary | ICD-10-CM

## 2021-04-27 DIAGNOSIS — C3412 Malignant neoplasm of upper lobe, left bronchus or lung: Secondary | ICD-10-CM | POA: Diagnosis present

## 2021-04-27 DIAGNOSIS — G8929 Other chronic pain: Secondary | ICD-10-CM

## 2021-04-27 DIAGNOSIS — K7010 Alcoholic hepatitis without ascites: Secondary | ICD-10-CM | POA: Diagnosis present

## 2021-04-27 DIAGNOSIS — M545 Low back pain, unspecified: Secondary | ICD-10-CM | POA: Diagnosis present

## 2021-04-27 DIAGNOSIS — C7951 Secondary malignant neoplasm of bone: Secondary | ICD-10-CM | POA: Diagnosis not present

## 2021-04-27 DIAGNOSIS — I959 Hypotension, unspecified: Secondary | ICD-10-CM | POA: Diagnosis not present

## 2021-04-27 DIAGNOSIS — Z87891 Personal history of nicotine dependence: Secondary | ICD-10-CM

## 2021-04-27 DIAGNOSIS — E222 Syndrome of inappropriate secretion of antidiuretic hormone: Secondary | ICD-10-CM | POA: Diagnosis present

## 2021-04-27 DIAGNOSIS — Z8546 Personal history of malignant neoplasm of prostate: Secondary | ICD-10-CM

## 2021-04-27 DIAGNOSIS — K760 Fatty (change of) liver, not elsewhere classified: Secondary | ICD-10-CM | POA: Diagnosis present

## 2021-04-27 DIAGNOSIS — Z923 Personal history of irradiation: Secondary | ICD-10-CM

## 2021-04-27 DIAGNOSIS — R262 Difficulty in walking, not elsewhere classified: Secondary | ICD-10-CM | POA: Diagnosis present

## 2021-04-27 DIAGNOSIS — J439 Emphysema, unspecified: Secondary | ICD-10-CM | POA: Diagnosis present

## 2021-04-27 DIAGNOSIS — I10 Essential (primary) hypertension: Secondary | ICD-10-CM | POA: Diagnosis present

## 2021-04-27 DIAGNOSIS — N179 Acute kidney failure, unspecified: Secondary | ICD-10-CM | POA: Diagnosis present

## 2021-04-27 MED ORDER — KETOROLAC TROMETHAMINE 15 MG/ML IJ SOLN
15.0000 mg | Freq: Once | INTRAMUSCULAR | Status: AC
  Administered 2021-04-27: 15 mg via INTRAMUSCULAR
  Filled 2021-04-27: qty 1

## 2021-04-27 MED ORDER — MORPHINE SULFATE (PF) 4 MG/ML IV SOLN
4.0000 mg | Freq: Once | INTRAVENOUS | Status: AC
  Administered 2021-04-27: 4 mg via INTRAMUSCULAR
  Filled 2021-04-27: qty 1

## 2021-04-27 MED ORDER — LIDOCAINE 5 % EX PTCH
1.0000 | MEDICATED_PATCH | CUTANEOUS | Status: DC
  Administered 2021-04-27 – 2021-05-06 (×8): 1 via TRANSDERMAL
  Filled 2021-04-27 (×11): qty 1

## 2021-04-27 NOTE — ED Triage Notes (Signed)
Pt bib [facility] from home d/t chronic back pain flare up. Starting today pt is unable to sit up, can only tolerate lying flat. Per EMS this flare began today. Pt has taken tylenol w/o relief.  ?

## 2021-04-28 ENCOUNTER — Inpatient Hospital Stay (HOSPITAL_COMMUNITY): Payer: Medicare HMO

## 2021-04-28 ENCOUNTER — Encounter (HOSPITAL_COMMUNITY): Payer: Self-pay | Admitting: Internal Medicine

## 2021-04-28 DIAGNOSIS — W19XXXA Unspecified fall, initial encounter: Secondary | ICD-10-CM

## 2021-04-28 DIAGNOSIS — Z9181 History of falling: Secondary | ICD-10-CM | POA: Diagnosis not present

## 2021-04-28 DIAGNOSIS — I959 Hypotension, unspecified: Secondary | ICD-10-CM | POA: Diagnosis not present

## 2021-04-28 DIAGNOSIS — F1090 Alcohol use, unspecified, uncomplicated: Secondary | ICD-10-CM | POA: Diagnosis not present

## 2021-04-28 DIAGNOSIS — G8929 Other chronic pain: Secondary | ICD-10-CM

## 2021-04-28 DIAGNOSIS — M545 Low back pain, unspecified: Secondary | ICD-10-CM | POA: Diagnosis present

## 2021-04-28 DIAGNOSIS — E875 Hyperkalemia: Secondary | ICD-10-CM | POA: Diagnosis present

## 2021-04-28 DIAGNOSIS — Z923 Personal history of irradiation: Secondary | ICD-10-CM | POA: Diagnosis not present

## 2021-04-28 DIAGNOSIS — Z8546 Personal history of malignant neoplasm of prostate: Secondary | ICD-10-CM | POA: Diagnosis not present

## 2021-04-28 DIAGNOSIS — Z87891 Personal history of nicotine dependence: Secondary | ICD-10-CM | POA: Diagnosis not present

## 2021-04-28 DIAGNOSIS — Z79899 Other long term (current) drug therapy: Secondary | ICD-10-CM | POA: Diagnosis not present

## 2021-04-28 DIAGNOSIS — E785 Hyperlipidemia, unspecified: Secondary | ICD-10-CM | POA: Diagnosis present

## 2021-04-28 DIAGNOSIS — C3412 Malignant neoplasm of upper lobe, left bronchus or lung: Secondary | ICD-10-CM | POA: Diagnosis present

## 2021-04-28 DIAGNOSIS — F1010 Alcohol abuse, uncomplicated: Secondary | ICD-10-CM | POA: Diagnosis present

## 2021-04-28 DIAGNOSIS — R7989 Other specified abnormal findings of blood chemistry: Secondary | ICD-10-CM

## 2021-04-28 DIAGNOSIS — N179 Acute kidney failure, unspecified: Secondary | ICD-10-CM | POA: Diagnosis present

## 2021-04-28 DIAGNOSIS — R262 Difficulty in walking, not elsewhere classified: Secondary | ICD-10-CM | POA: Diagnosis present

## 2021-04-28 DIAGNOSIS — E222 Syndrome of inappropriate secretion of antidiuretic hormone: Secondary | ICD-10-CM | POA: Diagnosis present

## 2021-04-28 DIAGNOSIS — C7951 Secondary malignant neoplasm of bone: Secondary | ICD-10-CM | POA: Diagnosis present

## 2021-04-28 DIAGNOSIS — J439 Emphysema, unspecified: Secondary | ICD-10-CM | POA: Diagnosis present

## 2021-04-28 DIAGNOSIS — K760 Fatty (change of) liver, not elsewhere classified: Secondary | ICD-10-CM | POA: Diagnosis present

## 2021-04-28 DIAGNOSIS — K7010 Alcoholic hepatitis without ascites: Secondary | ICD-10-CM | POA: Diagnosis present

## 2021-04-28 DIAGNOSIS — I10 Essential (primary) hypertension: Secondary | ICD-10-CM | POA: Diagnosis present

## 2021-04-28 LAB — COMPREHENSIVE METABOLIC PANEL
ALT: 74 U/L — ABNORMAL HIGH (ref 0–44)
AST: 117 U/L — ABNORMAL HIGH (ref 15–41)
Albumin: 4.7 g/dL (ref 3.5–5.0)
Alkaline Phosphatase: 72 U/L (ref 38–126)
Anion gap: 12 (ref 5–15)
BUN: 9 mg/dL (ref 8–23)
CO2: 20 mmol/L — ABNORMAL LOW (ref 22–32)
Calcium: 9.6 mg/dL (ref 8.9–10.3)
Chloride: 89 mmol/L — ABNORMAL LOW (ref 98–111)
Creatinine, Ser: 1.07 mg/dL (ref 0.61–1.24)
GFR, Estimated: >60 mL/min (ref >60)
Glucose, Bld: 109 mg/dL — ABNORMAL HIGH (ref 70–99)
Potassium: 5.9 mmol/L — ABNORMAL HIGH (ref 3.5–5.1)
Sodium: 121 mmol/L — ABNORMAL LOW (ref 135–145)
Total Bilirubin: 1.2 mg/dL (ref 0.3–1.2)
Total Protein: 9.5 g/dL — ABNORMAL HIGH (ref 6.5–8.1)

## 2021-04-28 LAB — CBC WITH DIFFERENTIAL/PLATELET
Abs Immature Granulocytes: 0.06 10*3/uL (ref 0.00–0.07)
Basophils Absolute: 0.1 10*3/uL (ref 0.0–0.1)
Basophils Relative: 1 %
Eosinophils Absolute: 0.1 10*3/uL (ref 0.0–0.5)
Eosinophils Relative: 1 %
HCT: 36.9 % — ABNORMAL LOW (ref 39.0–52.0)
Hemoglobin: 12.4 g/dL — ABNORMAL LOW (ref 13.0–17.0)
Immature Granulocytes: 1 %
Lymphocytes Relative: 7 %
Lymphs Abs: 0.8 10*3/uL (ref 0.7–4.0)
MCH: 29.9 pg (ref 26.0–34.0)
MCHC: 33.6 g/dL (ref 30.0–36.0)
MCV: 88.9 fL (ref 80.0–100.0)
Monocytes Absolute: 0.5 10*3/uL (ref 0.1–1.0)
Monocytes Relative: 5 %
Neutro Abs: 8.9 10*3/uL — ABNORMAL HIGH (ref 1.7–7.7)
Neutrophils Relative %: 85 %
Platelets: 371 10*3/uL (ref 150–400)
RBC: 4.15 MIL/uL — ABNORMAL LOW (ref 4.22–5.81)
RDW: 14.1 % (ref 11.5–15.5)
WBC: 10.4 10*3/uL (ref 4.0–10.5)
nRBC: 0 % (ref 0.0–0.2)

## 2021-04-28 LAB — RENAL FUNCTION PANEL
Albumin: 4.4 g/dL (ref 3.5–5.0)
Albumin: 4 g/dL (ref 3.5–5.0)
Albumin: 4 g/dL (ref 3.5–5.0)
Anion gap: 11 (ref 5–15)
Anion gap: 12 (ref 5–15)
Anion gap: 10 (ref 5–15)
BUN: 9 mg/dL (ref 8–23)
BUN: 10 mg/dL (ref 8–23)
BUN: 9 mg/dL (ref 8–23)
CO2: 22 mmol/L (ref 22–32)
CO2: 23 mmol/L (ref 22–32)
CO2: 23 mmol/L (ref 22–32)
Calcium: 9.7 mg/dL (ref 8.9–10.3)
Calcium: 9.5 mg/dL (ref 8.9–10.3)
Calcium: 9.7 mg/dL (ref 8.9–10.3)
Chloride: 90 mmol/L — ABNORMAL LOW (ref 98–111)
Chloride: 90 mmol/L — ABNORMAL LOW (ref 98–111)
Chloride: 90 mmol/L — ABNORMAL LOW (ref 98–111)
Creatinine, Ser: 0.79 mg/dL (ref 0.61–1.24)
Creatinine, Ser: 0.78 mg/dL (ref 0.61–1.24)
Creatinine, Ser: 0.82 mg/dL (ref 0.61–1.24)
GFR, Estimated: >60 mL/min (ref >60)
GFR, Estimated: >60 mL/min (ref >60)
GFR, Estimated: >60 mL/min (ref >60)
Glucose, Bld: 114 mg/dL — ABNORMAL HIGH (ref 70–99)
Glucose, Bld: 108 mg/dL — ABNORMAL HIGH (ref 70–99)
Glucose, Bld: 112 mg/dL — ABNORMAL HIGH (ref 70–99)
Phosphorus: 4 mg/dL (ref 2.5–4.6)
Phosphorus: 3.8 mg/dL (ref 2.5–4.6)
Phosphorus: 3.5 mg/dL (ref 2.5–4.6)
Potassium: 4.1 mmol/L (ref 3.5–5.1)
Potassium: 4.5 mmol/L (ref 3.5–5.1)
Potassium: 4 mmol/L (ref 3.5–5.1)
Sodium: 123 mmol/L — ABNORMAL LOW (ref 135–145)
Sodium: 125 mmol/L — ABNORMAL LOW (ref 135–145)
Sodium: 123 mmol/L — ABNORMAL LOW (ref 135–145)

## 2021-04-28 LAB — CBC
HCT: 33.8 % — ABNORMAL LOW (ref 39.0–52.0)
Hemoglobin: 11.8 g/dL — ABNORMAL LOW (ref 13.0–17.0)
MCH: 30.1 pg (ref 26.0–34.0)
MCHC: 34.9 g/dL (ref 30.0–36.0)
MCV: 86.2 fL (ref 80.0–100.0)
Platelets: 367 10*3/uL (ref 150–400)
RBC: 3.92 MIL/uL — ABNORMAL LOW (ref 4.22–5.81)
RDW: 13.7 % (ref 11.5–15.5)
WBC: 11 10*3/uL — ABNORMAL HIGH (ref 4.0–10.5)
nRBC: 0 % (ref 0.0–0.2)

## 2021-04-28 LAB — HEPATITIS PANEL, ACUTE
HCV Ab: NONREACTIVE
Hep A IgM: NONREACTIVE
Hep B C IgM: NONREACTIVE
Hepatitis B Surface Ag: NONREACTIVE

## 2021-04-28 LAB — OSMOLALITY, URINE: Osmolality, Ur: 326 mOsm/kg (ref 300–900)

## 2021-04-28 LAB — HIV ANTIBODY (ROUTINE TESTING W REFLEX): HIV Screen 4th Generation wRfx: NONREACTIVE

## 2021-04-28 LAB — PSA: Prostatic Specific Antigen: 0.22 ng/mL (ref 0.00–4.00)

## 2021-04-28 LAB — SODIUM, URINE, RANDOM: Sodium, Ur: 94 mmol/L

## 2021-04-28 LAB — MAGNESIUM: Magnesium: 1.9 mg/dL (ref 1.7–2.4)

## 2021-04-28 MED ORDER — MORPHINE SULFATE (PF) 2 MG/ML IV SOLN
1.0000 mg | Freq: Once | INTRAVENOUS | Status: AC | PRN
  Administered 2021-04-28: 1 mg via INTRAVENOUS
  Filled 2021-04-28: qty 1

## 2021-04-28 MED ORDER — AMLODIPINE BESYLATE 10 MG PO TABS
10.0000 mg | ORAL_TABLET | Freq: Every day | ORAL | Status: DC
  Administered 2021-04-28 – 2021-05-02 (×5): 10 mg via ORAL
  Filled 2021-04-28 (×7): qty 1

## 2021-04-28 MED ORDER — LORAZEPAM 1 MG PO TABS
0.0000 mg | ORAL_TABLET | ORAL | Status: DC
  Administered 2021-04-28 – 2021-04-29 (×2): 1 mg via ORAL
  Filled 2021-04-28 (×2): qty 1

## 2021-04-28 MED ORDER — ENOXAPARIN SODIUM 40 MG/0.4ML IJ SOSY
40.0000 mg | PREFILLED_SYRINGE | INTRAMUSCULAR | Status: DC
  Administered 2021-04-28 – 2021-05-01 (×4): 40 mg via SUBCUTANEOUS
  Filled 2021-04-28 (×4): qty 0.4

## 2021-04-28 MED ORDER — ONDANSETRON HCL 4 MG PO TABS: 4.0000 mg | ORAL_TABLET | Freq: Four times a day (QID) | ORAL | Status: DC | PRN

## 2021-04-28 MED ORDER — SODIUM BICARBONATE 8.4 % IV SOLN
50.0000 meq | Freq: Once | INTRAVENOUS | Status: AC
  Administered 2021-04-28: 50 meq via INTRAVENOUS
  Filled 2021-04-28: qty 50

## 2021-04-28 MED ORDER — MORPHINE SULFATE (PF) 2 MG/ML IV SOLN
2.0000 mg | INTRAVENOUS | Status: DC | PRN
  Administered 2021-04-29 – 2021-05-06 (×12): 2 mg via INTRAVENOUS
  Filled 2021-04-28 (×13): qty 1

## 2021-04-28 MED ORDER — OXYCODONE HCL 5 MG PO TABS
5.0000 mg | ORAL_TABLET | ORAL | Status: DC | PRN
  Administered 2021-04-30 – 2021-05-04 (×8): 5 mg via ORAL
  Filled 2021-04-28 (×8): qty 1

## 2021-04-28 MED ORDER — IRBESARTAN 300 MG PO TABS
300.0000 mg | ORAL_TABLET | Freq: Every day | ORAL | Status: DC
  Administered 2021-04-28 – 2021-05-03 (×6): 300 mg via ORAL
  Filled 2021-04-28 (×7): qty 1

## 2021-04-28 MED ORDER — DEXTROSE 50 % IV SOLN
25.0000 g | Freq: Once | INTRAVENOUS | Status: AC
  Administered 2021-04-28: 25 g via INTRAVENOUS
  Filled 2021-04-28: qty 50

## 2021-04-28 MED ORDER — IOHEXOL 300 MG/ML  SOLN
100.0000 mL | Freq: Once | INTRAMUSCULAR | Status: AC | PRN
  Administered 2021-04-28: 100 mL via INTRAVENOUS

## 2021-04-28 MED ORDER — LORAZEPAM 1 MG PO TABS: 0.0000 mg | ORAL_TABLET | Freq: Three times a day (TID) | ORAL | Status: DC

## 2021-04-28 MED ORDER — IPRATROPIUM-ALBUTEROL 0.5-2.5 (3) MG/3ML IN SOLN: 3.0000 mL | Freq: Four times a day (QID) | RESPIRATORY_TRACT | Status: DC | PRN

## 2021-04-28 MED ORDER — ADULT MULTIVITAMIN W/MINERALS CH
1.0000 | ORAL_TABLET | Freq: Every day | ORAL | Status: DC
  Administered 2021-04-28 – 2021-05-07 (×10): 1 via ORAL
  Filled 2021-04-28 (×11): qty 1

## 2021-04-28 MED ORDER — IOPAMIDOL (ISOVUE-300) INJECTION 61%: 100.0000 mL | Freq: Once | INTRAVENOUS | Status: DC | PRN

## 2021-04-28 MED ORDER — INSULIN ASPART 100 UNIT/ML IV SOLN
6.0000 [IU] | Freq: Once | INTRAVENOUS | Status: AC
  Administered 2021-04-28: 6 [IU] via INTRAVENOUS
  Filled 2021-04-28: qty 0.06

## 2021-04-28 MED ORDER — FOLIC ACID 1 MG PO TABS
1.0000 mg | ORAL_TABLET | Freq: Every day | ORAL | Status: DC
  Administered 2021-04-28 – 2021-05-07 (×10): 1 mg via ORAL
  Filled 2021-04-28 (×11): qty 1

## 2021-04-28 MED ORDER — LORAZEPAM 1 MG PO TABS
1.0000 mg | ORAL_TABLET | ORAL | Status: AC | PRN
  Administered 2021-04-29: 1 mg via ORAL
  Filled 2021-04-28: qty 1

## 2021-04-28 MED ORDER — IOHEXOL 9 MG/ML PO SOLN
500.0000 mL | ORAL | Status: AC
  Administered 2021-04-28: 500 mL via ORAL

## 2021-04-28 MED ORDER — ONDANSETRON HCL 4 MG/2ML IJ SOLN: 4.0000 mg | Freq: Four times a day (QID) | INTRAMUSCULAR | Status: DC | PRN

## 2021-04-28 MED ORDER — CALCIUM GLUCONATE 10 % IV SOLN
1.0000 g | Freq: Once | INTRAVENOUS | Status: AC
  Administered 2021-04-28: 1 g via INTRAVENOUS
  Filled 2021-04-28: qty 10

## 2021-04-28 MED ORDER — SODIUM ZIRCONIUM CYCLOSILICATE 5 G PO PACK
5.0000 g | PACK | Freq: Once | ORAL | Status: AC
  Administered 2021-04-28: 5 g via ORAL
  Filled 2021-04-28: qty 1

## 2021-04-28 MED ORDER — THIAMINE HCL 100 MG/ML IJ SOLN
100.0000 mg | Freq: Every day | INTRAMUSCULAR | Status: DC
  Filled 2021-04-28: qty 2

## 2021-04-28 MED ORDER — THIAMINE HCL 100 MG PO TABS
100.0000 mg | ORAL_TABLET | Freq: Every day | ORAL | Status: DC
  Administered 2021-04-28 – 2021-05-07 (×10): 100 mg via ORAL
  Filled 2021-04-28 (×11): qty 1

## 2021-04-28 MED ORDER — SODIUM CHLORIDE 0.9 % IV SOLN: INTRAVENOUS | Status: DC

## 2021-04-28 MED ORDER — LORAZEPAM 2 MG/ML IJ SOLN: 1.0000 mg | INTRAMUSCULAR | Status: AC | PRN

## 2021-04-28 MED ORDER — MORPHINE SULFATE (PF) 4 MG/ML IV SOLN
4.0000 mg | Freq: Once | INTRAVENOUS | Status: AC
  Administered 2021-04-28: 4 mg via INTRAVENOUS
  Filled 2021-04-28: qty 1

## 2021-04-28 MED ORDER — MORPHINE SULFATE (PF) 2 MG/ML IV SOLN: 1.0000 mg | Freq: Once | INTRAVENOUS | Status: DC | PRN

## 2021-04-28 MED ORDER — IOHEXOL 9 MG/ML PO SOLN
ORAL | Status: AC
  Administered 2021-04-28: 500 mL
  Filled 2021-04-28: qty 1000

## 2021-04-28 MED ORDER — SODIUM CHLORIDE 0.9 % IV BOLUS
1000.0000 mL | Freq: Once | INTRAVENOUS | Status: AC
  Administered 2021-04-28: 1000 mL via INTRAVENOUS

## 2021-04-28 MED ORDER — OXYCODONE-ACETAMINOPHEN 5-325 MG PO TABS
1.0000 | ORAL_TABLET | Freq: Four times a day (QID) | ORAL | Status: DC | PRN
  Administered 2021-04-30: 1 via ORAL
  Filled 2021-04-28: qty 1

## 2021-04-28 NOTE — ED Provider Notes (Addendum)
?[facility] DEPT ?Provider Note ? ?CSN: [id number] ?Arrival date & time: 04/27/21 1729 ? ?Chief Complaint(s) ?Back Pain ? ?HPI ?Terry Page. is a 66 y.o. male with PMH lung cancer, prostate cancer, alcohol abuse, bullous emphysema who presents emergency department for evaluation of back pain.  Patient states that he suffered a fall 2 days ago but was able to walk without difficulty after the fall.  He states that this morning he awoke with left-sided hip pain that radiates down into the foot and that the pain is so bad he is unable to walk anymore.  He denies fever, chest pain, shortness of breath, abdominal pain, nausea, vomiting or other systemic symptoms.  He states he does have a history of chronic back pain. ? ? ?Past Medical History ?Past Medical History:  ?Diagnosis Date  ? Alcohol abuse   ? Asthma   ? 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 Essential hypertension 04/10/2020  ? Hemorrhoids   ? History of radiation therapy 04/08/20-04/19/20  ? IMRT- Left lung- Dr. Gery Pray  ? Incidental lung nodule, greater than or equal to 20mm 10/22/2017  ? Left upper lobe - discovered on CTA  ? Prostate cancer ([place])   ? Spontaneous pneumothorax 10/20/2017  ? right  ? Tobacco abuse   ? ?Patient Active Problem List  ? Diagnosis Date Noted  ? Hemoptysis 05/18/2020  ? Protein-calorie malnutrition, severe 05/07/2020  ? Abdominal pain   ? Necrotizing pneumonia ([place]) 05/03/2020  ? 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 Acute respiratory failure with hypoxia ([place]) 04/12/2020  ? Pneumonia due to COVID-19 virus 04/10/2020  ? Primary cancer of left upper lobe of lung ([place]) 04/10/2020  ? Cavitary pneumonia 04/10/2020  ? 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 Nicotine dependence 04/10/2020  ? Essential hypertension 04/10/2020  ? Mixed hyperlipidemia 04/10/2020  ? Sepsis ([place]) 04/10/2020  ? Recurrent spontaneous pneumothorax 06/01/2019  ? Malignant neoplasm of prostate ([place]) 05/20/2019  ?  Hyponatremia 12/04/2017  ? AKI (acute kidney injury) ([place]) 12/04/2017  ? Incidental lung nodule, greater than or equal to 67mm 10/22/2017  ? COPD with acute exacerbation ([place])   ? Spontaneous pneumothorax 10/20/2017  ? Tobacco abuse   ? Alcohol abuse   ? ?Home Medication(s) ?Prior to Admission medications   ?Medication Sig Start Date End Date Taking? Authorizing Provider  ?acetaminophen (TYLENOL) 325 MG tablet Take 2 tablets (650 mg total) by mouth every 6 (six) hours as needed for mild pain or headache (fever >/= 101). 05/10/20   Arrien, Jimmy Picket, MD  ?albuterol (VENTOLIN HFA) 108 (90 Base) MCG/ACT inhaler Inhale 2 puffs into the lungs every 6 (six) hours as needed for wheezing or shortness of breath. 06/06/19 06/25/20  Elodia Florence., MD  ?amLODipine (NORVASC) 10 MG tablet Take 1 tablet by mouth daily. 06/14/20   [provider]  ?atorvastatin (LIPITOR) 20 MG tablet Take 20 mg by mouth every evening. 01/28/19   [provider]  ?diphenoxylate-atropine (LOMOTIL) 2.5-0.025 MG tablet Take 1 tablet by mouth 4 (four) times daily as needed for diarrhea or loose stools. 05/10/20   Arrien, Jimmy Picket, MD  ?ipratropium-albuterol (DUONEB) 0.5-2.5 (3) MG/3ML SOLN Take 3 mLs by nebulization every 6 (six) hours as needed. ?Patient taking differently: Take 3 mLs by nebulization every 6 (six) hours as needed (shortness of breathing or wheezing). 05/10/20   Arrien, Jimmy Picket, MD  ?losartan (COZAAR) 100 MG tablet Take 100 mg by mouth daily.  [provider]  ?polyethylene glycol (MIRALAX / GLYCOLAX) 17 g packet Take 17 g by mouth 2 (two) times daily. 05/10/20   Arrien, Jimmy Picket, MD  ?Tiotropium Bromide-Olodaterol (STIOLTO RESPIMAT) 2.5-2.5 MCG/ACT AERS Inhale 2 puffs into the lungs daily. 06/25/20   [name], DO  ?                                                                                                                                  ?Past Surgical History ?Past  Surgical History:  ?Procedure Laterality Date  ? BACK SURGERY    ? PROSTATE BIOPSY    ? ?Family History ?Family History  ?Problem Relation Age of Onset  ? Cancer Cousin   ?     maternal cousin  ? Cancer Cousin   ?     paternal cousin  ? Cancer Cousin   ? Colon polyps Neg Hx   ? Pancreatic disease Neg Hx   ? Pancreatic cancer Neg Hx   ? Breast cancer Neg Hx   ? Colon cancer Neg Hx   ? ? ?Social History ?Social History  ? ?Tobacco Use  ? Smoking status: Former  ?  Types: Cigarettes  ?  Quit date: 11/22/2019  ?  Years since quitting: 1.4  ? Smokeless tobacco: Never  ? Tobacco comments:  ?  Patient reports quit 3 years ago. 06/25/20. HSM  ?Vaping Use  ? Vaping Use: Never used  ?Substance Use Topics  ? Alcohol use: Yes  ?  Alcohol/week: 12.0 standard drinks  ?  Types: 12 Cans of beer per week  ?  Comment: daily  ? Drug use: No  ? ?Allergies ?Patient has no known allergies. ? ?Review of Systems ?Review of Systems  ?Musculoskeletal:  Positive for back pain.  ? ?Physical Exam ?Vital Signs  ?I have reviewed the triage vital signs ?BP 130/86 (BP Location: Left Arm)   Pulse 92   Temp 97.8 ?F (36.6 ?C) (Oral)   Resp 18   SpO2 99%  ? ?Physical Exam ?Vitals and nursing note reviewed.  ?Constitutional:   ?   General: He is not in acute distress. ?   Appearance: He is well-developed.  ?HENT:  ?   Head: Normocephalic and atraumatic.  ?Eyes:  ?   Conjunctiva/sclera: Conjunctivae normal.  ?Cardiovascular:  ?   Rate and Rhythm: Normal rate and regular rhythm.  ?   Heart sounds: No murmur heard. ?Pulmonary:  ?   Effort: Pulmonary effort is normal. No respiratory distress.  ?   Breath sounds: Normal breath sounds.  ?Abdominal:  ?   Palpations: Abdomen is soft.  ?   Tenderness: There is no abdominal tenderness.  ?Musculoskeletal:     ?   General: Tenderness (L-spine) present. No swelling.  ?   Cervical back: Neck supple.  ?Skin: ?   General: Skin is warm and dry.  ?   Capillary Refill: Capillary refill takes  less than 2 seconds.   ?Neurological:  ?   Mental Status: He is alert.  ?   Cranial Nerves: No cranial nerve deficit.  ?   Sensory: No sensory deficit.  ?   Motor: No weakness.  ?Psychiatric:     ?   Mood and Affect: Mood normal.  ? ? ?ED Results and Treatments ?Labs ?(all labs ordered are listed, but only abnormal results are displayed) ?Labs Reviewed  ?COMPREHENSIVE METABOLIC PANEL  ?CBC WITH DIFFERENTIAL/PLATELET  ?                                                                                                                       ? ?Radiology ?CT Lumbar Spine Wo Contrast ? ?Result Date: 04/27/2021 ?CLINICAL DATA:  Low back pain EXAM: CT LUMBAR SPINE WITHOUT CONTRAST TECHNIQUE: Multidetector CT imaging of the lumbar spine was performed without intravenous contrast administration. Multiplanar CT image reconstructions were also generated. RADIATION DOSE REDUCTION: This exam was performed according to the departmental dose-optimization program which includes automated exposure control, adjustment of the mA and/or kV according to patient size and/or use of iterative reconstruction technique. COMPARISON:  CT 04/25/2020 and MRI spine 04/24/2020 FINDINGS: Segmentation: 5 lumbar type vertebrae. Alignment: Normal. Vertebrae: No fracture. Lytic lesion with soft tissue mass in the right iliac bone. Similar appearing lytic lesion in the left sacrum extending to the SI joint. Paraspinal and other soft tissues: Advanced aortic atherosclerosis. Disc levels: At L1-L2, maintained disc space. No canal stenosis. The foramen are patent. At L2-L3, maintained disc space. Mild diffuse disc bulge. Bilateral facet degenerative changes. Mild canal stenosis. The foramen are patent bilaterally. At L3-L4, maintained disc space. Mild diffuse disc bulge. Facet degenerative changes bilaterally. No significant canal stenosis. The foramen are patent bilaterally. At L4-L5, maintained disc space. Mild diffuse disc bulge. No high-grade canal stenosis. Hypertrophic facet  degenerative changes bilaterally. Mild left foraminal narrowing. At L5-S1, maintained disc space. No canal stenosis. Hypertrophic facet degenerative changes. The foramen are patent bilaterally. IMPRESSION: 1. Lytic lesion

## 2021-04-28 NOTE — ED Notes (Signed)
Attempted IV x2 without success however blood work was able to be obtained.  ?

## 2021-04-28 NOTE — Consult Note (Signed)
[place]  ?[phone] [phone]  ? ?MEDICAL ONCOLOGY - INITIAL CONSULTATION ? ?Referral MD: Dr. Debe Coder Kommor ? ?Reason for Referral: Lytic bone lesions ? ?HPI: Terry Page is a 66 year old male with a past medical history significant for bullous emphysema, history of spontaneous pneumothorax on the right, hypertension, prostate cancer status post radiation, left lung nodule concerning for primary lung cancer (never biopsied) status post SBRT, alcohol abuse. He presented to the emergency department for back pain.  2 days prior to presentation, he had a fall try to get up out of his chair.  He reported that the pain in his back made him fall.  He fell again the morning prior to presentation.  Due to pain, he had some difficulty moving his legs.  He had a CT of the lumbar spine performed which showed lytic lesions associated with soft tissue mass in the right iliac bone and left sacrum extending to the SI joint concerning for metastatic disease or myeloma.  There was no acute fracture seen. ? ?The patient has been seen by radiation oncology previously.  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  He received curative radiation to the prostate from 08/19/2019 through 10/07/2019.  He received 70 Gy in 28 fractions of 2.5 Gy.  Radiation to the prostate was under the care of Dr. Tyler Pita.  Following his radiation to the prostate, he initially had a CT of the chest without contrast on 06/17/2019 which showed a stable 9 mm irregular central left upper lobe pulmonary nodule.  Observation was recommended.  He had a follow-up CT of the chest with contrast performed on 01/07/2020 which showed interval enlargement of a spiculated nodule in the central left upper lobe measuring 1.2 x 1.0 cm and highly suspicious for primary lung malignancy. He had a PET scan performed on 02/06/2020 which showed a  hypermetabolic irregular solid 1.3 cm left upper lobe pulmonary nodule compatible with malignancy without any other hypermetabolic lesions.  His lung nodule was never biopsied. He received SBRT to the lung lesion.  SBRT was given on 04/08/2020, 04/15/2020, and 04/19/2020.  He has been followed by Dr. Valeta Harms from pulmonology and last had a CT super D chest performed on 02/03/2021 which showed interval contraction of irregular spiculated densities in the left upper lobe compared to the prior study most consistent with evolving radiation changes.  There were no enlarging nodules to suggest local recurrence and no evidence of metastatic disease or acute process. ? ?The patient was seen in his hospital room today.  His son was at the bedside.  He reports that his pain is better after receiving pain medication.  Pain is located in his lower back.  He states that he has had back pain for many years but reports that he has had a disc issue.  More recently, the patient was getting progressively worse and cause some difficulty walking for him.  The patient states that he fell because he felt his legs were giving out on him.  He has not really been able to walk very much due to the pain.  He reports a good appetite and no recent weight loss.  He is not having any headaches or dizziness.  He denies chest pain and shortness of breath at rest.  He does have some dyspnea with exertion.  Denies cough or hemoptysis.  Denies abdominal pain, nausea, vomiting, constipation,  diarrhea.  No bleeding.  Denies bowel and bladder incontinence.  The patient is single.  He is originally from [place].  He has 4 children-2 live locally.  He reports that he drinks several cans of beer daily.  He previously smoked 1/2 pack of cigarettes per day for 50 years.  He quit 4 years ago.  Family history significant for a father who had cancer but he is not sure what type of cancer he had. Medical oncology was asked see the patient to make recommendations  regarding his abnormal imaging findings. ? ?Past Medical History:  ?Diagnosis Date  ? Alcohol abuse   ? Asthma   ? 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 Essential hypertension 04/10/2020  ? Hemorrhoids   ? History of radiation therapy 04/08/20-04/19/20  ? IMRT- Left lung- Dr. Gery Pray  ? Incidental lung nodule, greater than or equal to 59mm 10/22/2017  ? Left upper lobe - discovered on CTA  ? Prostate cancer ([place])   ? Spontaneous pneumothorax 10/20/2017  ? right  ? Tobacco abuse   ?: ? ?Past Surgical History:  ?Procedure Laterality Date  ? BACK SURGERY    ? PROSTATE BIOPSY    ?: ? ?Current Facility-Administered Medications  ?Medication Dose Route Frequency Provider Last Rate Last Admin  ? 0.9 %  sodium chloride infusion   Intravenous Continuous Kyle, Tyrone A, DO 100 mL/hr at 04/28/21 0821 New Bag at 04/28/21 0821  ? folic acid (FOLVITE) tablet 1 mg  1 mg Oral Daily Kyle, Tyrone A, DO      ? lidocaine (LIDODERM) 5 % 1 patch  1 patch Transdermal Q24H Kommor, [place], MD   1 patch at 04/27/21 2139  ? LORazepam (ATIVAN) tablet 1-4 mg  1-4 mg Oral Q1H PRN Cherylann Ratel A, DO      ? Or  ? LORazepam (ATIVAN) injection 1-4 mg  1-4 mg Intravenous Q1H PRN Marylyn Ishihara, Tyrone A, DO      ? LORazepam (ATIVAN) tablet 0-4 mg  0-4 mg Oral Q4H Kyle, Tyrone A, DO      ? Followed by  ? [START ON 04/30/2021] LORazepam (ATIVAN) tablet 0-4 mg  0-4 mg Oral Q8H Kyle, Tyrone A, DO      ? morphine (PF) 2 MG/ML injection 2 mg  2 mg Intravenous Q4H PRN Marylyn Ishihara, Tyrone A, DO      ? multivitamin with minerals tablet 1 tablet  1 tablet Oral Daily Kyle, Tyrone A, DO      ? oxyCODONE-acetaminophen (PERCOCET/ROXICET) 5-325 MG per tablet 1 tablet  1 tablet Oral Q6H PRN Marylyn Ishihara, Tyrone A, DO      ? And  ? oxyCODONE (Oxy IR/ROXICODONE) immediate release tablet 5 mg  5 mg Oral Q4H PRN Marylyn Ishihara, Tyrone A, DO      ? thiamine tablet 100 mg  100 mg Oral Daily Kyle, Tyrone A, DO      ? Or  ? thiamine (B-1) injection 100 mg  100 mg Intravenous Daily Marylyn Ishihara, Tyrone A, DO      ? ? ?No  Known Allergies: ? ?Family History  ?Problem Relation Age of Onset  ? Cancer Cousin   ?     maternal cousin  ? Cancer Cousin   ?     paternal cousin  ? Cancer Cousin   ? Colon polyps Neg Hx   ? Pancreatic disease Neg Hx   ? Pancreatic cancer Neg Hx   ? Breast cancer Neg Hx   ? Colon cancer Neg Hx   ?: ? ?  Social History  ? ?Socioeconomic History  ? Marital status: Single  ?  Spouse name: Not on file  ? Number of children: Not on file  ? Years of education: Not on file  ? Highest education level: Not on file  ?Occupational History  ? Occupation: retired  ?Tobacco Use  ? Smoking status: Former  ?  Types: Cigarettes  ?  Quit date: 11/22/2019  ?  Years since quitting: 1.4  ? Smokeless tobacco: Never  ? Tobacco comments:  ?  Patient reports quit 3 years ago. 06/25/20. [facility]  ?Vaping Use  ? Vaping Use: Never used  ?Substance and Sexual Activity  ? Alcohol use: Yes  ?  Alcohol/week: 12.0 standard drinks  ?  Types: 12 Cans of beer per week  ?  Comment: daily  ? Drug use: No  ? Sexual activity: Not Currently  ?Other Topics Concern  ? Not on file  ?Social History Narrative  ? Not on file  ? ?Social Determinants of Health  ? ?Financial Resource Strain: Not on file  ?Food Insecurity: Not on file  ?Transportation Needs: Not on file  ?Physical Activity: Not on file  ?Stress: Not on file  ?Social Connections: Not on file  ?Intimate Partner Violence: Not on file  ?: ? ?Review of Systems: A comprehensive 14 point review of systems was negative except as noted in the HPI. ? ?Exam: ?Patient Vitals for the past 24 hrs: ? BP Temp Temp src Pulse Resp SpO2 Height Weight  ?04/28/21 0754 (!) 138/91 97.9 ?F (36.6 ?C) Oral 92 18 97 % -- --  ?04/28/21 0354 -- -- -- -- -- -- 5\' 7"  (1.702 m) 75.3 kg  ?04/28/21 0342 (!) 159/87 (!) 97.4 ?F (36.3 ?C) Oral (!) 105 18 97 % -- --  ?04/28/21 0255 -- -- -- 98 14 99 % -- --  ?04/28/21 0230 (!) 145/86 -- -- 99 20 100 % -- --  ?04/28/21 0215 124/82 -- -- 83 18 100 % -- --  ?04/28/21 0145 126/83 -- -- 81 14 99  % -- --  ?04/28/21 0046 138/87 97.7 ?F (36.5 ?C) Oral 83 17 100 % -- --  ?04/28/21 0039 -- 97.7 ?F (36.5 ?C) Oral -- -- -- -- --  ?04/27/21 2141 130/86 97.8 ?F (36.6 ?C) Oral 92 18 99 % -- --  ?04/27/21 1735 1

## 2021-04-28 NOTE — ED Notes (Signed)
ED TO INPATIENT HANDOFF REPORT ? ?Name/Age/Gender ?[name]. ?66 y.o. ?male ? ?Code Status ?Code Status History   ? ? Date Active Date Inactive Code Status Order ID Comments User Context  ? 05/18/2020 1631 05/22/2020 1841 Full Code [id number]  Mike Craze, DO Inpatient  ? 04/10/2020 2012 05/11/2020 0139 Full Code [id number]  Vernelle Emerald, MD ED  ? 06/01/2019 1637 06/06/2019 1724 Full Code [id number]  Karmen Bongo, MD ED  ? 12/04/2017 1807 12/06/2017 1704 Full Code [id number]  Janora Norlander, MD ED  ? 10/23/2017 1728 10/24/2017 1707 Full Code [id number]  Clyde Canterbury, RN Inpatient  ? ?  ? ? ?Home/SNF/Other ?Home ? ?Chief Complaint ?Hyperkalemia [E87.5] ? ?Level of Care/Admitting Diagnosis ?ED Disposition   ? ? ED Disposition  ?Admit  ? Condition  ?--  ? Comment  ?Hospital Area: [facility] [[id number]] ? Level of Care: Telemetry [5] ? Admit to tele based on following criteria: Monitor QTC interval ? May place patient in observation at [facility] or [place] if equivalent level of care is available:: Yes ? Covid Evaluation: Asymptomatic - no recent exposure (last 10 days) testing not required ? Diagnosis: Hyperkalemia [938182] ? Admitting Physician: Shela Leff [[id number]] ? Attending Physician: Shela Leff [[id number]] ?  ?  ? ?  ? ? ?Medical History ?Past Medical History:  ?Diagnosis Date  ? Alcohol abuse   ? Asthma   ? 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 Essential hypertension 04/10/2020  ? Hemorrhoids   ? History of radiation therapy 04/08/20-04/19/20  ? IMRT- Left lung- Dr. Gery Pray  ? Incidental lung nodule, greater than or equal to 63mm 10/22/2017  ? Left upper lobe - discovered on CTA  ? Prostate cancer ([place])   ? Spontaneous pneumothorax 10/20/2017  ? right  ? Tobacco abuse   ? ? ?Allergies ?No Known Allergies ? ?IV Location/Drains/Wounds ?Patient Lines/Drains/Airways Status   ? ? Active Line/Drains/Airways   ? ? Name Placement date Placement time Site Days  ? Peripheral IV  04/28/21 20 G Right Antecubital 04/28/21  0119  Antecubital  less than 1  ? ?  ?  ? ?  ? ? ?Labs/Imaging ?Results for orders placed or performed during the hospital encounter of 04/27/21 (from the past 48 hour(s))  ?Comprehensive metabolic panel     Status: Abnormal  ? Collection Time: 04/28/21 12:43 AM  ?Result Value Ref Range  ? Sodium 121 (L) 135 - 145 mmol/L  ? Potassium 5.9 (H) 3.5 - 5.1 mmol/L  ? Chloride 89 (L) 98 - 111 mmol/L  ? CO2 20 (L) 22 - 32 mmol/L  ? Glucose, Bld 109 (H) 70 - 99 mg/dL  ?  Comment: Glucose reference range applies only to samples taken after fasting for at least 8 hours.  ? BUN 9 8 - 23 mg/dL  ? Creatinine, Ser 1.07 0.61 - 1.24 mg/dL  ? Calcium 9.6 8.9 - 10.3 mg/dL  ? Total Protein 9.5 (H) 6.5 - 8.1 g/dL  ? Albumin 4.7 3.5 - 5.0 g/dL  ? AST 117 (H) 15 - 41 U/L  ? ALT 74 (H) 0 - 44 U/L  ? Alkaline Phosphatase 72 38 - 126 U/L  ? Total Bilirubin 1.2 0.3 - 1.2 mg/dL  ? GFR, Estimated >60 >60 mL/min  ?  Comment: (NOTE) ?Calculated using the CKD-EPI Creatinine Equation (2021) ?  ? Anion gap 12 5 - 15  ?  Comment: Performed at [facility], [place] [address]  [address] [place], [place] [address]  ?CBC with Differential     Status: Abnormal  ? Collection Time: 04/28/21 12:43 AM  ?Result Value Ref Range  ? WBC 10.4 4.0 - 10.5 K/uL  ? RBC 4.15 (L) 4.22 - 5.81 MIL/uL  ? Hemoglobin 12.4 (L) 13.0 - 17.0 g/dL  ? HCT 36.9 (L) 39.0 - 52.0 %  ? MCV 88.9 80.0 - 100.0 fL  ? MCH 29.9 26.0 - 34.0 pg  ? MCHC 33.6 30.0 - 36.0 g/dL  ? RDW 14.1 11.5 - 15.5 %  ? Platelets 371 150 - 400 K/uL  ? nRBC 0.0 0.0 - 0.2 %  ? Neutrophils Relative % 85 %  ? Neutro Abs 8.9 (H) 1.7 - 7.7 K/uL  ? Lymphocytes Relative 7 %  ? Lymphs Abs 0.8 0.7 - 4.0 K/uL  ? Monocytes Relative 5 %  ? Monocytes Absolute 0.5 0.1 - 1.0 K/uL  ? Eosinophils Relative 1 %  ? Eosinophils Absolute 0.1 0.0 - 0.5 K/uL  ? Basophils Relative 1 %  ? Basophils Absolute 0.1 0.0 - 0.1 K/uL  ? Immature Granulocytes 1 %  ? Abs Immature Granulocytes 0.06  0.00 - 0.07 K/uL  ?  Comment: Performed at [facility], [place] [address] [place], [place] [address]  ? ?CT Lumbar Spine Wo Contrast ? ?Result Date: 04/27/2021 ?CLINICAL DATA:  Low back pain EXAM: CT LUMBAR SPINE WITHOUT CONTRAST TECHNIQUE: Multidetector CT imaging of the lumbar spine was performed without intravenous contrast administration. Multiplanar CT image reconstructions were also generated. RADIATION DOSE REDUCTION: This exam was performed according to the departmental dose-optimization program which includes automated exposure control, adjustment of the mA and/or kV according to patient size and/or use of iterative reconstruction technique. COMPARISON:  CT 04/25/2020 and MRI spine 04/24/2020 FINDINGS: Segmentation: 5 lumbar type vertebrae. Alignment: Normal. Vertebrae: No fracture. Lytic lesion with soft tissue mass in the right iliac bone. Similar appearing lytic lesion in the left sacrum extending to the SI joint. Paraspinal and other soft tissues: Advanced aortic atherosclerosis. Disc levels: At L1-L2, maintained disc space. No canal stenosis. The foramen are patent. At L2-L3, maintained disc space. Mild diffuse disc bulge. Bilateral facet degenerative changes. Mild canal stenosis. The foramen are patent bilaterally. At L3-L4, maintained disc space. Mild diffuse disc bulge. Facet degenerative changes bilaterally. No significant canal stenosis. The foramen are patent bilaterally. At L4-L5, maintained disc space. Mild diffuse disc bulge. No high-grade canal stenosis. Hypertrophic facet degenerative changes bilaterally. Mild left foraminal narrowing. At L5-S1, maintained disc space. No canal stenosis. Hypertrophic facet degenerative changes. The foramen are patent bilaterally. IMPRESSION: 1. Lytic lesions with associated soft tissue mass in the right iliac bone and left sacrum extending to the SI joint concerning for metastatic disease or myeloma. No acute fracture is seen. 2. Mild  multilevel degenerative changes. Electronically Signed   By: Donavan Foil M.D.   On: 04/27/2021 23:01   ? ?Pending Labs ?Unresulted Labs (From admission, onward)  ? ? None  ? ?  ? ? ?Vitals/Pain ?Today's Vitals  ? 04/28/21 0145 04/28/21 0215 04/28/21 0230 04/28/21 0255  ?BP: 126/83 124/82 (!) 145/86   ?Pulse: 81 83 99 98  ?Resp: 14 18 20 14   ?Temp:      ?TempSrc:      ?SpO2: 99% 100% 100% 99%  ?PainSc:      ? ? ?Isolation Precautions ?No active isolations ? ?Medications ?Medications  ?lidocaine (LIDODERM) 5 % 1 patch (1 patch Transdermal Patch Applied 04/27/21 2139)  ?  ketorolac (TORADOL) 15 MG/ML injection 15 mg (15 mg Intramuscular Given 04/27/21 2139)  ?morphine (PF) 4 MG/ML injection 4 mg (4 mg Intramuscular Given 04/27/21 2256)  ?morphine (PF) 4 MG/ML injection 4 mg (4 mg Intravenous Given 04/28/21 0119)  ?calcium gluconate inj 10% (1 g) URGENT USE ONLY! (1 g Intravenous Given 04/28/21 0218)  ?dextrose 50 % solution 25 g (25 g Intravenous Given 04/28/21 0219)  ?sodium bicarbonate injection 50 mEq (50 mEq Intravenous Given 04/28/21 0219)  ?insulin aspart (novoLOG) injection 6 Units (6 Units Intravenous Given 04/28/21 0219)  ?sodium zirconium cyclosilicate (LOKELMA) packet 5 g (5 g Oral Given 04/28/21 0220)  ?sodium chloride 0.9 % bolus 1,000 mL (1,000 mLs Intravenous New Bag/Given 04/28/21 0220)  ? ? ?Mobility ?walks with person assist  ?

## 2021-04-28 NOTE — ED Provider Notes (Signed)
Signed out by Dr Matilde Sprang to admit when labs resulted. ? ?Na low, k high. Ca gl iv, d50 iv, hco3 iv, insulin iv, lokelma po, ns bolus.  ? ?Hospitalists consulted for admission. ? ? ?  ?Lajean Saver, MD ?04/28/21 0205 ? ?

## 2021-04-28 NOTE — H&P (Signed)
?History and Physical  ? ? ?Patient: Terry Page. NTZ:[id number] DOB: February 01, 1955 ?DOA: 04/27/2021 ?DOS: the patient was seen and examined on 04/28/2021 ?PCP: [name], MD  ?Patient coming from: Home ? ?Chief Complaint:  ?Chief Complaint  ?Patient presents with  ? Back Pain  ? ?HPI: Terry Page. is a 66 y.o. male with medical history significant of bullous emphysema, HTN, prostate CA, EtOH abuse, LUL Lung CA. Presenting with falls. He reports that he's had falls for the past 2 days. They happen when he tries to get out of his chair. He doesn't trip. He falls secondary to pain in his lower back. He says the back pain is chronic but seems to be worsening. He hasn't try any new meds or therapies for it. He hasn't seen any one for this increase in pain prior to this presentation to the ED. He tried to get out of bed yesterday evening, but was unable to move d/t pain in his back and legs. He became concerned and called EMS. He denies any other aggravating or alleviating factors.  ? ?Review of Systems: As mentioned in the history of present illness. All other systems reviewed and are negative. ?Past Medical History:  ?Diagnosis Date  ? Alcohol abuse   ? Asthma   ? 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 Essential hypertension 04/10/2020  ? Hemorrhoids   ? History of radiation therapy 04/08/20-04/19/20  ? IMRT- Left lung- Dr. Gery Pray  ? Incidental lung nodule, greater than or equal to 1mm 10/22/2017  ? Left upper lobe - discovered on CTA  ? Prostate cancer ([place])   ? Spontaneous pneumothorax 10/20/2017  ? right  ? Tobacco abuse   ? ?Past Surgical History:  ?Procedure Laterality Date  ? BACK SURGERY    ? PROSTATE BIOPSY    ? ?Social History:  reports that he quit smoking about 17 months ago. His smoking use included cigarettes. He has never used smokeless tobacco. He reports current alcohol use of about 12.0 standard drinks per week. He reports that he does not use drugs. ? ?No Known Allergies ? ?Family History   ?Problem Relation Age of Onset  ? Cancer Cousin   ?     maternal cousin  ? Cancer Cousin   ?     paternal cousin  ? Cancer Cousin   ? Colon polyps Neg Hx   ? Pancreatic disease Neg Hx   ? Pancreatic cancer Neg Hx   ? Breast cancer Neg Hx   ? Colon cancer Neg Hx   ? ? ?Prior to Admission medications   ?Medication Sig Start Date End Date Taking? Authorizing Provider  ?acetaminophen (TYLENOL) 325 MG tablet Take 2 tablets (650 mg total) by mouth every 6 (six) hours as needed for mild pain or headache (fever >/= 101). 05/10/20  Yes Arrien, Jimmy Picket, MD  ?albuterol (VENTOLIN HFA) 108 (90 Base) MCG/ACT inhaler Inhale 2 puffs into the lungs every 6 (six) hours as needed for wheezing or shortness of breath. 06/06/19 04/29/22 Yes Elodia Florence., MD  ?amLODipine (NORVASC) 10 MG tablet Take 1 tablet by mouth daily. 06/14/20  Yes [provider]  ?atorvastatin (LIPITOR) 20 MG tablet Take 20 mg by mouth daily. 01/28/19  Yes [provider]  ?ipratropium-albuterol (DUONEB) 0.5-2.5 (3) MG/3ML SOLN Take 3 mLs by nebulization every 6 (six) hours as needed. ?Patient taking differently: Take 3 mLs by nebulization every 6 (six) hours as needed (shortness of breathing or wheezing). 05/10/20  Yes Arrien, Jimmy Picket, MD  ?olmesartan (BENICAR) 40 MG tablet Take 40 mg by mouth daily. 03/03/21  Yes [provider]  ? ? ?Physical Exam: ?Vitals:  ? 04/28/21 0230 04/28/21 0255 04/28/21 0342 04/28/21 0354  ?BP: (!) 145/86  (!) 159/87   ?Pulse: 99 98 (!) 105   ?Resp: 20 14 18    ?Temp:   (!) 97.4 ?F (36.3 ?C)   ?TempSrc:   Oral   ?SpO2: 100% 99% 97%   ?Weight:    75.3 kg  ?Height:    5\' 7"  (1.702 m)  ? ?General: 66 y.o. male resting in bed in NAD ?Eyes: PERRL, normal sclera ?ENMT: Nares patent w/o discharge, orophaynx clear, dentition normal, ears w/o discharge/lesions/ulcers ?Neck: Supple, trachea midline ?Cardiovascular: tachy, +S1, S2, no m/g/r, equal pulses throughout ?Respiratory: CTABL, no w/r/r, normal  WOB ?GI: BS+, NDNT, no masses noted, no organomegaly noted ?MSK: No e/c/c ?Neuro: A&O x 3, no focal deficits, tremulous ?Psyc: Appropriate interaction but flat affect, calm/cooperative ? ?Data Reviewed: ? ?Na+ 121 ?K+ 5.9 ?AST 117 ?ALT  74 ?Hgb  12.4 ? ? ?CT lumbar:  ?1. Lytic lesions with associated soft tissue mass in the right iliac bone and left sacrum extending to the SI joint concerning for metastatic disease or myeloma. No acute fracture is seen. ?2. Mild multilevel degenerative changes. ? ?Assessment and Plan: ?No notes have been filed under this hospital service. ?Service: Hospitalist ?Hyponatremia ?    - placed in obs, tele; will change to inpt ?    - check Uosm, UNa+  ?    - NS @ 100cc/hr; follow q6h renal function panel, limit increase to 8-10pt/day ?    - etiology uncertain ? ?EtOH abuse ?    - drinks at least 3 to 4 beers/day ?    - tremulous on exam ?    - CIWA w/ scheduled ativan ?    - counseled against further use ?    - thiamine, MVI, folic acid ? ?Hyperkalemia ?    - resolved after lokelma ? ?Falls ?Chronic back pain ?    - PRN pain control ?    - CT as above ?    - will have PT see ? ?Hx of prostate CA ?New lytic lesions ?    - CT as above ?    - onco consulted; defer further w/u to them; appreciate assistance ? ?Emphysema ?    - continue home regimen ? ?HLD ?    - hold statin d/t elevated LFTs ? ?Elevated LFTs ?    - check hepatitis panel, RUQ ab [place] ? ?Advance Care Planning:   Code Status: FULL ? ?Consults: Onco ? ?Family Communication: None at bedside ? ?Severity of Illness: ?The appropriate patient status for this patient is INPATIENT. Inpatient status is judged to be reasonable and necessary in order to provide the required intensity of service to ensure the patient's safety. The patient's presenting symptoms, physical exam findings, and initial radiographic and laboratory data in the context of their chronic comorbidities is felt to place them at high risk for further clinical  deterioration. Furthermore, it is not anticipated that the patient will be medically stable for discharge from the hospital within 2 midnights of admission.  ? ?* I certify that at the point of admission it is my clinical judgment that the patient will require inpatient hospital care spanning beyond 2 midnights from the point of admission due to high intensity of service, high risk for further  deterioration and high frequency of surveillance required.* ? ?Author: ?Jonnie Finner, DO ?04/28/2021 7:28 AM ? ?For on call review [email or web address].  ?

## 2021-04-29 DIAGNOSIS — E875 Hyperkalemia: Secondary | ICD-10-CM | POA: Diagnosis not present

## 2021-04-29 LAB — RENAL FUNCTION PANEL
Albumin: 3.7 g/dL (ref 3.5–5.0)
Albumin: 3.8 g/dL (ref 3.5–5.0)
Anion gap: 8 (ref 5–15)
Anion gap: 9 (ref 5–15)
BUN: 11 mg/dL (ref 8–23)
BUN: 13 mg/dL (ref 8–23)
CO2: 23 mmol/L (ref 22–32)
CO2: 23 mmol/L (ref 22–32)
Calcium: 8.9 mg/dL (ref 8.9–10.3)
Calcium: 9.1 mg/dL (ref 8.9–10.3)
Chloride: 91 mmol/L — ABNORMAL LOW (ref 98–111)
Chloride: 90 mmol/L — ABNORMAL LOW (ref 98–111)
Creatinine, Ser: 0.99 mg/dL (ref 0.61–1.24)
Creatinine, Ser: 0.83 mg/dL (ref 0.61–1.24)
GFR, Estimated: >60 mL/min
GFR, Estimated: >60 mL/min (ref >60)
Glucose, Bld: 105 mg/dL — ABNORMAL HIGH (ref 70–99)
Glucose, Bld: 104 mg/dL — ABNORMAL HIGH (ref 70–99)
Phosphorus: 3.8 mg/dL (ref 2.5–4.6)
Phosphorus: 3.6 mg/dL (ref 2.5–4.6)
Potassium: 4.3 mmol/L (ref 3.5–5.1)
Potassium: 3.9 mmol/L (ref 3.5–5.1)
Sodium: 122 mmol/L — ABNORMAL LOW (ref 135–145)
Sodium: 122 mmol/L — ABNORMAL LOW (ref 135–145)

## 2021-04-29 LAB — SODIUM
Sodium: 124 mmol/L — ABNORMAL LOW (ref 135–145)
Sodium: 126 mmol/L — ABNORMAL LOW (ref 135–145)

## 2021-04-29 LAB — CBC
HCT: 32.4 % — ABNORMAL LOW (ref 39.0–52.0)
Hemoglobin: 11.2 g/dL — ABNORMAL LOW (ref 13.0–17.0)
MCH: 31 pg (ref 26.0–34.0)
MCHC: 34.6 g/dL (ref 30.0–36.0)
MCV: 89.8 fL (ref 80.0–100.0)
Platelets: 315 K/uL (ref 150–400)
RBC: 3.61 MIL/uL — ABNORMAL LOW (ref 4.22–5.81)
RDW: 14.2 % (ref 11.5–15.5)
WBC: 7.4 K/uL (ref 4.0–10.5)
nRBC: 0 % (ref 0.0–0.2)

## 2021-04-29 LAB — OSMOLALITY: Osmolality: 261 mOsm/kg — ABNORMAL LOW (ref 275–295)

## 2021-04-29 LAB — KAPPA/LAMBDA LIGHT CHAINS
Kappa free light chain: 33.6 mg/L — ABNORMAL HIGH (ref 3.3–19.4)
Kappa, lambda light chain ratio: 1.53 (ref 0.26–1.65)
Lambda free light chains: 22 mg/L (ref 5.7–26.3)

## 2021-04-29 NOTE — TOC Initial Note (Signed)
Transition of Care (TOC) - Initial/Assessment Note  ? ? ?Patient Details  ?Name: Jamarkus Lisbon. ?MRN: [id number] ?Date of Birth: 06/05/1955 ? ?Transition of Care (TOC) CM/SW Contact:    ?Yunus Stoklosa, Marjie Skiff, RN ?Phone Number: ?04/29/2021, 2:02 PM ? ?Clinical Narrative:                 ?Spoke with pt about ETOH resources. Pt politely declines resources. PT eval pending. ? ?Expected Discharge Plan: Home/Self Care ?Barriers to Discharge: Continued Medical Work up ? ? ?Patient Goals and CMS Choice ?Patient states their goals for this hospitalization and ongoing recovery are:: To go home ?  ?  ? ?Expected Discharge Plan and Services ?Expected Discharge Plan: Home/Self Care ?  ?Discharge Planning Services: CM Consult ?  ?Living arrangements for the past 2 months: Apartment ?                ?  ?  ?Prior Living Arrangements/Services ?Living arrangements for the past 2 months: Apartment ?Lives with:: Spouse ?Patient language and need for interpreter reviewed:: Yes ?Do you feel safe going back to the place where you live?: Yes      ?Need for Family Participation in Patient Care: Yes (Comment) ?Care giver support system in place?: Yes (comment) ?  ?Criminal Activity/Legal Involvement Pertinent to Current Situation/Hospitalization: No - Comment as needed ? ?Activities of Daily Living ?Home Assistive Devices/Equipment: None ?ADL Screening (condition at time of admission) ?Patient's cognitive ability adequate to safely complete daily activities?: Yes ?Is the patient deaf or have difficulty hearing?: No ?Does the patient have difficulty seeing, even when wearing glasses/contacts?: No ?Does the patient have difficulty concentrating, remembering, or making decisions?: No ?Patient able to express need for assistance with ADLs?: Yes ?Does the patient have difficulty dressing or bathing?: No ?Independently performs ADLs?: Yes (appropriate for developmental age) ?Does the patient have difficulty walking or climbing stairs?: No ?Weakness  of Legs: None ?Weakness of Arms/Hands: None ? ?Permission Sought/Granted ?  ?  ?   ?   ?   ?   ? ?Emotional Assessment ?  ?  ?  ?Orientation: : Oriented to Self, Oriented to Place, Oriented to  Time, Oriented to Situation ?Alcohol / Substance Use: Alcohol Use ?Psych Involvement: No (comment) ? 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 Diagnosis Date Noted  ? Hyperkalemia 04/28/2021  ? Elevated LFTs 04/28/2021  ? Falls 04/28/2021  ? Chronic back pain 04/28/2021  ? Hemoptysis 05/18/2020  ? Protein-calorie malnutrition, severe 05/07/2020  ? Abdominal pain   ? Necrotizing pneumonia ([place]) 05/03/2020  ? 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 Acute respiratory failure with hypoxia ([place]) 04/12/2020  ? Pneumonia due to COVID-19 virus 04/10/2020  ? 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 Cavitary pneumonia 04/10/2020  ? 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 Nicotine dependence 04/10/2020  ? Essential hypertension 04/10/2020  ? HLD (hyperlipidemia) 04/10/2020  ? Sepsis ([place]) 04/10/2020  ? Recurrent spontaneous pneumothorax 06/01/2019  ? Malignant neoplasm of prostate ([place]) 05/20/2019  ? Hyponatremia 12/04/2017  ? AKI (acute kidney injury) ([place]) 12/04/2017  ? Incidental lung nodule, greater than or equal to 55mm 10/22/2017  ? COPD with acute exacerbation ([place])   ? Spontaneous pneumothorax 10/20/2017  ? Tobacco abuse   ? Alcohol abuse   ? ?PCP:  [name], MD ?Pharmacy:   ?[id number] [address] ?[place] ?[place] [address] ?Phone: [phone] Fax: [phone] ? ? ? ? ?Social Determinants of Health (SDOH) Interventions ?  ? ?Readmission Risk Interventions ? ?  04/29/2021  ?  2:00 PM  ?Readmission Risk Prevention Plan  ?Transportation Screening  Complete  ?PCP or Specialist Appt within 5-7 Days Complete  ?Home Care Screening Complete  ?Medication Review (RN CM) Complete  ? ? ? ?

## 2021-04-29 NOTE — Progress Notes (Signed)
?PROGRESS NOTE ? ? ? ?Terry Page.  OQH:[id number] DOB: 01/02/1956 DOA: 04/27/2021 ?PCP: [name], MD ? ? ?Brief Narrative:  ?HPI: Terry Page. is a 66 y.o. male with medical history significant of bullous emphysema, HTN, prostate CA, EtOH abuse, LUL Lung CA. Presenting with falls. He reports that he's had falls for the past 2 days. They happen when he tries to get out of his chair. He doesn't trip. He falls secondary to pain in his lower back. He says the back pain is chronic but seems to be worsening. He hasn't try any new meds or therapies for it. He hasn't seen any one for this increase in pain prior to this presentation to the ED. He tried to get out of bed yesterday evening, but was unable to move d/t pain in his back and legs. He became concerned and called EMS. He denies any other aggravating or alleviating factors.  ? ?Assessment & Plan: ?  ?Principal Problem: ?  Hyperkalemia ?Active Problems: ?  HLD (hyperlipidemia) ?  Alcohol abuse ?  Hyponatremia ?  Malignant neoplasm of prostate ([place]) ?  Bullous emphysema ([place]) ?  Elevated LFTs ?  Falls ?  Chronic back pain ? ?Additional lesions/soft tissue mass right iliac bone and left sacrum: Given history of prostate cancer, PSA was checked and was normal.  Bone lesions thought to be unrelated to his history of prostate cancer.  In the past, he received SBRT to the left lung for presumed lung cancer (lung nodule was never biopsy).  Oncology saw patient and discussed that current findings may be related to his presumed lung cancer versus another malignancy.  CT chest abdomen pelvis ordered and completed by them.  Awaiting further plans from oncology. ? ?Frequent falls: Due to [place] lesions.  He will eventually need PT OT evaluation and I suspect he will end up going to SNF. ? ?Acute on chronic hyponatremia:Patient appears to have chronic hyponatremia with baseline sodium around 132 point currently sodium is 122.  This is slight drop from 125  yesterday while he is on normal saline.  We will stop normal saline.  Check serum osmolarity.  Urine osmolarity and serum spot sodium is already reported. ? ?Hyperkalemia: Resolved. ? ?Elevated LFTs: Slightly elevated, AST greater than ALT, indicating alcoholic hepatitis.  Ultrasound liver negative for any lesion. ? ?Alcohol abuse: He drinks 3-4 beers per day with last drink about 2 days ago.  He is not giving me any further history of any withdrawal symptoms in the past.  Currently has no withdrawal symptoms.  CIWA has remained under 5.  I will discontinue scheduled Ativan but will continue CIWA protocol with as needed Ativan. ? ?Essential hypertension: Controlled, continue amlodipine and irbesartan. ? ?Left lung nodule: No prior biopsy of the left nodule but he received SBRT due to lung nodule completed in April 2022.  Defer to oncology. ? 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 ?--Status post radiation to the prostate completed 10/07/2019. ? ?DVT prophylaxis: enoxaparin (LOVENOX) injection 40 mg Start: 04/28/21 1800 ?  Code Status: Full Code  ?Family Communication:  None present at bedside.  Plan of care discussed with patient in length and he/she verbalized understanding and agreed with it. ? ?Status is: Inpatient ?Remains inpatient appropriate because: Needs further work-up of possible primary malignancy with metastasis. ? ? ?Estimated body mass index is 26 kg/m? as calculated from the following: ?  Height as of this  encounter: 5\' 7"  (1.702 m). ?  Weight as of this encounter: 75.3 kg. ? ?  ?Nutritional Assessment: ?Body mass index is 26 kg/m?Marland KitchenMarland Kitchen ?Seen by dietician.  I agree with the assessment and plan as outlined below: ?Nutrition Status: ?  ?  ?  ? ?. ?Skin Assessment: ?I have examined the patient's skin and I agree with the wound assessment as performed by the wound care RN as outlined below: ?  ? ?Consultants:  ?Oncology ? ?Procedures:   ?None ? ?Antimicrobials:  ?Anti-infectives (From admission, onward)  ? ? None  ? ?  ?  ? ? ?Subjective: ?Seen and examined.  Continues to complain of back pain with no improvement.  Currently on, back pain is worse with laying still and gets better with activity. ? ?Objective: ?Vitals:  ? 04/28/21 1628 04/28/21 2057 04/29/21 0345 04/29/21 0438  ?BP:  101/76  114/82  ?Pulse: 99 (!) 103 97 (!) 103  ?Resp:  18  18  ?Temp:  98.2 ?F (36.8 ?C)  98.4 ?F (36.9 ?C)  ?TempSrc:  Oral  Oral  ?SpO2:  97%  97%  ?Weight:      ?Height:      ? ? ?Intake/Output Summary (Last 24 hours) at 04/29/2021 1010 ?Last data filed at 04/28/2021 2751 ?Gross per 24 hour  ?Intake --  ?Output 1500 ml  ?Net -1500 ml  ? ?Filed Weights  ? 04/28/21 0354  ?Weight: 75.3 kg  ? ? ?Examination: ? ?General exam: Appears calm and comfortable  ?Respiratory system: Clear to auscultation. Respiratory effort normal. ?Cardiovascular system: S1 & S2 heard, RRR. No JVD, murmurs, rubs, gallops or clicks. No pedal edema. ?Gastrointestinal system: Abdomen is nondistended, soft and nontender. No organomegaly or masses felt. Normal bowel sounds heard. ?Central nervous system: Alert and oriented. No focal neurological deficits. ?Extremities: Symmetric 5 x 5 power. ?Skin: No rashes, lesions or ulcers ?Psychiatry: Judgement and insight appear normal. Mood & affect appropriate.  ? ? ?Data Reviewed: I have personally reviewed following labs and imaging studies ? ?CBC: ?Recent Labs  ?Lab 04/28/21 ?7001 04/28/21 ?7494 04/29/21 ?0114  ?WBC 10.4 11.0* 7.4  ?NEUTROABS 8.9*  --   --   ?HGB 12.4* 11.8* 11.2*  ?HCT 36.9* 33.8* 32.4*  ?MCV 88.9 86.2 89.8  ?PLT 371 367 315  ? ?Basic Metabolic Panel: ?Recent Labs  ?Lab 04/28/21 ?0742 04/28/21 ?0816 04/28/21 ?1318 04/28/21 ?2007 04/29/21 ?0114 04/29/21 ?4967  ?NA 123*  --  125* 123* 122* 122*  ?K 4.1  --  4.5 4.0 4.3 3.9  ?CL 90*  --  90* 90* 91* 90*  ?CO2 22  --  23 23 23 23   ?GLUCOSE 114*  --  108* 112* 105* 104*  ?BUN 9  --  10 9 11 13    ?CREATININE 0.79  --  0.78 0.82 0.99 0.83  ?CALCIUM 9.7  --  9.5 9.7 8.9 9.1  ?MG  --  1.9  --   --   --   --   ?PHOS 4.0  --  3.8 3.5 3.8 3.6  ? ?GFR: ?Estimated Creatinine Clearance: 80.7 mL/min (by C-G formula based on SCr of 0.83 mg/dL). ?Liver Function Tests: ?Recent Labs  ?Lab 04/28/21 ?5916 04/28/21 ?3846 04/28/21 ?1318 04/28/21 ?2007 04/29/21 ?0114 04/29/21 ?0654  ?AST 117*  --   --   --   --   --   ?ALT 74*  --   --   --   --   --   ?ALKPHOS 72  --   --   --   --   --   ?  BILITOT 1.2  --   --   --   --   --   ?PROT 9.5*  --   --   --   --   --   ?ALBUMIN 4.7 4.4 4.0 4.0 3.7 3.8  ? ?No results for input(s): LIPASE, AMYLASE in the last 168 hours. ?No results for input(s): AMMONIA in the last 168 hours. ?Coagulation Profile: ?No results for input(s): INR, PROTIME in the last 168 hours. ?Cardiac Enzymes: ?No results for input(s): CKTOTAL, CKMB, CKMBINDEX, TROPONINI in the last 168 hours. ?BNP (last 3 results) ?No results for input(s): PROBNP in the last 8760 hours. ?HbA1C: ?No results for input(s): HGBA1C in the last 72 hours. ?CBG: ?No results for input(s): GLUCAP in the last 168 hours. ?Lipid Profile: ?No results for input(s): CHOL, HDL, LDLCALC, TRIG, CHOLHDL, LDLDIRECT in the last 72 hours. ?Thyroid Function Tests: ?No results for input(s): TSH, T4TOTAL, FREET4, T3FREE, THYROIDAB in the last 72 hours. ?Anemia Panel: ?No results for input(s): VITAMINB12, FOLATE, FERRITIN, TIBC, IRON, RETICCTPCT in the last 72 hours. ?Sepsis Labs: ?No results for input(s): PROCALCITON, LATICACIDVEN in the last 168 hours. ? ?No results found for this or any previous visit (from the past 240 hour(s)).  ? ?Radiology Studies: ?CT Lumbar Spine Wo Contrast ? ?Result Date: 04/27/2021 ?CLINICAL DATA:  Low back pain EXAM: CT LUMBAR SPINE WITHOUT CONTRAST TECHNIQUE: Multidetector CT imaging of the lumbar spine was performed without intravenous contrast administration. Multiplanar CT image reconstructions were also generated. RADIATION  DOSE REDUCTION: This exam was performed according to the departmental dose-optimization program which includes automated exposure control, adjustment of the mA and/or kV according to patient size and/or use of i

## 2021-04-29 NOTE — Evaluation (Signed)
Physical Therapy Evaluation ?Patient Details ?Name: Terry Page. ?MRN: [id number] ?DOB: [birth year] ?Today's Date: 04/29/2021 ? ?History of Present Illness ? Terry Page. is a 66 y.o. male with medical history significant of bullous emphysema, HTN, prostate CA, EtOH abuse, LUL Lung CA. Presenting 04/27/21  with falls..  Patient found with hyperkalemia and hyponatremia( baseline hyponatremic).CT lumbar: . Lytic lesions with associated soft tissue mass in the right iliac bone and left sacrum extending to the SI joint concerning for metastatic disease or myeloma. No acute fracture is seen  ?Clinical Impression ? The  patient reports that he has back pain and right hip pain when ambulating. Noted antalgic on right leg.Patient ambulated x 40' using RW and min assistance for    safety and balance. ? Patient resides alone, reports son and daughter 'Live around the corner." No family present to confirm this nor if patient will  have 24/7  assistance.  ?Pt admitted with above diagnosis.  Pt currently with functional limitations due to the deficits listed below (see PT Problem List). Pt will benefit from skilled PT to increase their independence and safety with mobility to allow discharge to the venue listed below.   ? ?   ? ?Recommendations for follow up therapy are one component of a multi-disciplinary discharge planning process, led by the attending physician.  Recommendations may be updated based on patient status, additional functional criteria and insurance authorization. ? ?Follow Up Recommendations Skilled nursing-short term rehab (<3 hours/day) ? ?  ?Assistance Recommended at Discharge    ?Patient can return home with the following ? A little help with walking and/or transfers;A little help with bathing/dressing/bathroom;Help with stairs or ramp for entrance;Assistance with cooking/housework;Assist for transportation ? ?  ?Equipment Recommendations None recommended by PT  ?Recommendations for Other  Services ?    ?  ?Functional Status Assessment Patient has had a recent decline in their functional status and demonstrates the ability to make significant improvements in function in a reasonable and predictable amount of time.  ? ?  ?Precautions / Restrictions Precautions ?Precautions: Fall ?Restrictions ?Weight Bearing Restrictions: No  ? ?  ? ?Mobility ? Bed Mobility ?Overal bed mobility: Needs Assistance ?Bed Mobility: Supine to Sit ?  ?  ?Supine to sit: Min guard, HOB elevated ?  ?  ?General bed mobility comments: extra time, ?  ? ?Transfers ?Overall transfer level: Needs assistance ?Equipment used: Rolling walker (2 wheels) ?Transfers: Sit to/from Stand ?Sit to Stand: Min assist ?  ?  ?  ?  ?  ?General transfer comment: cues for safety ?  ? ?Ambulation/Gait ?Ambulation/Gait assistance: Min assist ?Gait Distance (Feet): 40 Feet ?Assistive device: Rolling walker (2 wheels) ?Gait Pattern/deviations: Step-through pattern, Antalgic, Trunk flexed ?Gait velocity: decr ?  ?  ?General Gait Details: gait is slow, steady assistance for safety and balance. ? ?Stairs ?  ?  ?  ?  ?  ? ?Wheelchair Mobility ?  ? ?Modified Rankin (Stroke Patients Only) ?  ? ?  ? ?Balance Overall balance assessment: Needs assistance, History of Falls ?Sitting-balance support: Feet supported, Bilateral upper extremity supported ?Sitting balance-Leahy Scale: Fair ?  ?  ?Standing balance support: During functional activity, Bilateral upper extremity supported, Reliant on assistive device for balance ?Standing balance-Leahy Scale: Poor ?Standing balance comment: reliant on support ?  ?  ?  ?  ?  ?  ?  ?  ?  ?  ?  ?   ? ? ? ?Pertinent Vitals/Pain Pain Assessment ?Pain Assessment:  Faces ?Faces Pain Scale: Hurts even more ?Pain Location: back and right hip with WB. ?Pain Descriptors / Indicators: Discomfort ?Pain Intervention(s): Monitored during session  ? ? ?Home Living Family/patient expects to be discharged to:: Private residence ?Living  Arrangements: Alone;Children ?Available Help at Discharge: Available PRN/intermittently ?Type of Home: Apartment ?Home Access: Level entry ?  ?  ?  ?Home Layout: One level ?Home Equipment: Rollator (4 wheels);Grab bars - tub/shower;Shower seat ?   ?  ?Prior Function Prior Level of Function : Independent/Modified Independent ?  ?  ?  ?  ?  ?  ?Mobility Comments: does not drive ?ADLs Comments: independent ?  ? ? ?Hand Dominance  ? Dominant Hand: Right ? ?  ?Extremity/Trunk Assessment  ? Upper Extremity Assessment ?Upper Extremity Assessment: Overall WFL for tasks assessed ?  ? ?Lower Extremity Assessment ?Lower Extremity Assessment: Generalized weakness ?  ? ?Cervical / Trunk Assessment ?Cervical / Trunk Assessment: Normal  ?Communication  ? Communication: No difficulties  ?Cognition Arousal/Alertness: Awake/alert ?Behavior During Therapy: [facility] for tasks assessed/performed, Flat affect ?Overall Cognitive Status: No family/caregiver present to determine baseline cognitive functioning ?Area of Impairment: Safety/judgement, Awareness ?  ?  ?  ?  ?  ?  ?  ?  ?  ?  ?  ?  ?Safety/Judgement: Decreased awareness of deficits ?Awareness: Emergent ?  ?General Comments: oriented to place .time and that he fell at home and family called  EMS ?  ?  ? ?  ?General Comments   ? ?  ?Exercises    ? ?Assessment/Plan  ?  ?PT Assessment Patient needs continued PT services  ?PT Problem List Decreased strength;Decreased mobility;Decreased safety awareness;Decreased activity tolerance;Decreased knowledge of precautions;Decreased knowledge of use of DME ? ?   ?  ?PT Treatment Interventions DME instruction;Therapeutic activities;Gait training;Therapeutic exercise;Patient/family education   ? ?PT Goals (Current goals can be found in the Care Plan section)  ?Acute Rehab PT Goals ?Patient Stated Goal: agreed to ambulate ?PT Goal Formulation: With patient ?Time For Goal Achievement: 05/13/21 ?Potential to Achieve Goals: Fair ? ?  ?Frequency Min  2X/week ?  ? ? ?Co-evaluation   ?  ?  ?  ?  ? ? ?  ?AM-PAC PT "6 Clicks" Mobility  ?Outcome Measure Help needed turning from your back to your side while in a flat bed without using bedrails?: A Little ?Help needed moving from lying on your back to sitting on the side of a flat bed without using bedrails?: A Little ?Help needed moving to and from a bed to a chair (including a wheelchair)?: A Little ?Help needed standing up from a chair using your arms (e.g., wheelchair or bedside chair)?: A Little ?Help needed to walk in hospital room?: A Lot ?Help needed climbing 3-5 steps with a railing? : Total ?6 Click Score: 15 ? ?  ?End of Session Equipment Utilized During Treatment: Gait belt ?Activity Tolerance: Patient tolerated treatment well ?Patient left: in chair;with call bell/phone within reach;with chair alarm set ?Nurse Communication: Mobility status ?PT Visit Diagnosis: Unsteadiness on feet (R26.81);Repeated falls (R29.6) ?  ? ?Time: 1751-0258 ?PT Time Calculation (min) (ACUTE ONLY): 18 min ? ? ?Charges:   PT Evaluation ?$PT Eval Low Complexity: 1 Low ?  ?  ?   ? ? ?Tresa Endo PT ?Acute Rehabilitation Services ?Pager [phone] ?Office [phone] ? ? ?Mikaela Hilgeman, Shella Maxim ?04/29/2021, 4:38 PM ? ?

## 2021-04-30 DIAGNOSIS — E875 Hyperkalemia: Secondary | ICD-10-CM | POA: Diagnosis not present

## 2021-04-30 LAB — BASIC METABOLIC PANEL
Anion gap: 11 (ref 5–15)
BUN: 20 mg/dL (ref 8–23)
CO2: 24 mmol/L (ref 22–32)
Calcium: 9.6 mg/dL (ref 8.9–10.3)
Chloride: 89 mmol/L — ABNORMAL LOW (ref 98–111)
Creatinine, Ser: 1.01 mg/dL (ref 0.61–1.24)
GFR, Estimated: >60 mL/min (ref >60)
Glucose, Bld: 105 mg/dL — ABNORMAL HIGH (ref 70–99)
Potassium: 3.9 mmol/L (ref 3.5–5.1)
Sodium: 124 mmol/L — ABNORMAL LOW (ref 135–145)

## 2021-04-30 LAB — SODIUM
Sodium: 125 mmol/L — ABNORMAL LOW (ref 135–145)
Sodium: 124 mmol/L — ABNORMAL LOW (ref 135–145)

## 2021-04-30 MED ORDER — CAMPHOR-MENTHOL 0.5-0.5 % EX LOTN
TOPICAL_LOTION | CUTANEOUS | Status: DC | PRN
  Filled 2021-04-30: qty 222

## 2021-04-30 MED ORDER — ZOLEDRONIC ACID 4 MG/5ML IV CONC
4.0000 mg | Freq: Once | INTRAVENOUS | Status: AC
  Administered 2021-04-30: 4 mg via INTRAVENOUS
  Filled 2021-04-30: qty 5

## 2021-04-30 NOTE — Progress Notes (Signed)
I met Terry Page this morning.  He is very nice.  He has a tough problem.  I would have to think that what we are probably looking at is metastatic lung cancer.  His PSA was only 0.22. ? ?Looks like the initial cancer was for the prostate cancer and lung cancer was treated with radiation back in April 2020. ? ?He then had what appeared to be eye recurrence of the lung cancer in the left lung.  This was back in February 2022.  I think he underwent SBRT.  I do not see where he had any kind of follow-up after that. ? ?He now is in the hospital with weakness and pain.  His scans show that he has multiple lytic bone lesions.  He has some soft tissue components to these lesions. ? ?He has hyponatremia.  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 ? ?He is quite weak.  He is having quite a bit of pain. ? ?He probably needs to have some Zometa. ? ?He says that if he has lung cancer, he would not want any chemotherapy. ? ?He clearly has a candidate for radiation therapy. ? ?He had labs done today which show sodium 124.  Potassium 3.9.  BUN 20 creatinine 1.01.  Calcium is 9.6. ? ?He says he is eating okay.  No nausea or vomiting. ? ?Again the problem is that he is quite weak.  He has been seen by physical therapy.  They recommended skilled nursing/short-term rehab. ? ?Again, the issue is what kind of cancer he is dealing with.  Again I suspect this is probably going to be metastatic lung cancer.  He has initial disease back in 2020.  Looks like there was a recurrence in 2022.  Both were treated with SBRT. ? ?I talked with Terry Page.  I explained to him that what he is dealing with cannot be cured.  It can certainly be treated to try to help his quality of life.  He understands this. ? ?I would get Interventional Radiology involved to try to get a biopsy. ? ?He will need Radiation Oncology to see him to see about palliative radiation therapy to the painful areas. ? ?Ultimately, he will need some form of systemic therapy.  This is where a  biopsy would be incredibly helpful to do molecular analysis.  Immunotherapy could be considered for him if he has a high PD-L1. ? ?His hyponatremia needs to be corrected.  I do not know if this is SIADH. ? ?His performance status is not that good right now (ECOG 2-3). ? ?We will continue to follow along. ? ?I know that he is getting wonderful care from the staff [facility]. ? ? ?Lattie Haw, MD ? ?Darlyn Chamber 29:11 ? ? ? ? ?

## 2021-04-30 NOTE — [place] Notes (Signed)
?PROGRESS NOTE ? ? ? ?Terry Page.  GLO:[id number] DOB: Jan 17, 1955 DOA: 04/27/2021 ?PCP: [name], MD ? ? ?Brief Narrative:  ?HPI: Terry Page. is a 66 y.o. male with medical history significant of bullous emphysema, HTN, prostate CA, EtOH abuse, LUL Lung CA. Presenting with falls. He reports that he's had falls for the past 2 days. They happen when he tries to get out of his chair. He doesn't trip. He falls secondary to pain in his lower back. He says the back pain is chronic but seems to be worsening. He hasn't try any new meds or therapies for it. He hasn't seen any one for this increase in pain prior to this presentation to the ED. He tried to get out of bed yesterday evening, but was unable to move d/t pain in his back and legs. He became concerned and called EMS. He denies any other aggravating or alleviating factors.  ? ?Assessment & Plan: ?  ?Principal Problem: ?  Hyperkalemia ?Active Problems: ?  HLD (hyperlipidemia) ?  Alcohol abuse ?  Hyponatremia ?  Malignant neoplasm of prostate ([place]) ?  Bullous emphysema ([place]) ?  Elevated LFTs ?  Falls ?  Chronic back pain ? ?Additional lesions/soft tissue mass right iliac bone and left sacrum: Given history of prostate cancer, PSA was checked and was normal.  Bone lesions thought to be unrelated to his history of prostate cancer.  In the past, he received SBRT to the left lung for presumed lung cancer (lung nodule was never biopsy).  Oncology saw patient and discussed that current findings may be related to his presumed lung cancer versus another malignancy.  CT chest abdomen pelvis ordered and completed by them, there is no new lesion that can be biopsied.  Still awaiting further plans from oncology which are not clear yet.  I have reached out to on-call oncologist Dr. Marin Olp and requested to see the patient and clarify plans. ? ?Frequent falls: Seen by PT OT.  Recommendation to send to SNF.  TOC aware. ? ?Acute on chronic hyponatremia/ ?  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  Source of SIADH remains unknown at this point in time.  Sodium improving.  Patient remains asymptomatic. ? ?Hyperkalemia: Resolved. ? ?Elevated LFTs: Slightly elevated, AST greater than ALT, indicating alcoholic hepatitis.  Ultrasound liver negative for any lesion. ? ?Alcohol abuse: He drinks 3-4 beers per day with last drink about 2 days ago.  No withdrawal symptoms. ? ?Essential hypertension: Controlled, continue amlodipine and irbesartan. ? ?Left lung nodule: No prior biopsy of the left nodule but he received SBRT due to lung nodule completed in April 2022.  Defer to oncology. ? 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 ?--Status post radiation to the prostate completed 10/07/2019. ? ?DVT prophylaxis: enoxaparin (LOVENOX) injection 40 mg Start: 04/28/21 1800 ?  Code Status: Full Code  ?Family Communication:  None present at bedside.  Plan of care discussed with patient in length and he/she verbalized understanding and agreed with it. ? ?Status is: Inpatient ?Remains inpatient appropriate because: Needs further work-up of possible primary malignancy with metastasis. ? ? ?Estimated body mass index is 26 kg/m? as calculated from the following: ?  Height as of this encounter: 5\' 7"  (1.702 m). ?  Weight as of this encounter: 75.3 kg. ? ?  ?Nutritional Assessment: ?Body mass index is 26 kg/m?Marland KitchenMarland Kitchen ?Seen by dietician.  I agree with the assessment and plan as  outlined below: ?Nutrition Status: ?  ?  ?  ? ?. ?Skin Assessment: ?I have examined the patient's skin and I agree with the wound assessment as performed by the wound care RN as outlined below: ?  ? ?Consultants:  ?Oncology ? ?Procedures:  ?None ? ?Antimicrobials:  ?Anti-infectives (From admission, onward)  ? ? None  ? ?  ?  ? ? ?Subjective: ? ?Patient seen and examined.  His back pain is slightly improved compared to yesterday.  No other complaint.  He was asking what  the plan is from oncology standpoint.  I reassured him that I will reach out to them. ? ?Objective: ?Vitals:  ? 04/29/21 1700 04/29/21 2056 04/30/21 0415 04/30/21 0538  ?BP:  98/71 108/77 126/82  ?Pulse: 88 99 91 89  ?Resp:  18 18 18   ?Temp:  98.7 ?F (37.1 ?C) 97.7 ?F (36.5 ?C) 97.7 ?F (36.5 ?C)  ?TempSrc:  Oral Oral Oral  ?SpO2:  96% 98% 99%  ?Weight:      ?Height:      ? ? ?Intake/Output Summary (Last 24 hours) at 04/30/2021 1003 ?Last data filed at 04/30/2021 0807 ?Gross per 24 hour  ?Intake 720 ml  ?Output --  ?Net 720 ml  ? ? ?Filed Weights  ? 04/28/21 0354  ?Weight: 75.3 kg  ? ? ?Examination: ? ?General exam: Appears calm and comfortable  ?Respiratory system: Clear to auscultation. Respiratory effort normal. ?Cardiovascular system: S1 & S2 heard, RRR. No JVD, murmurs, rubs, gallops or clicks. No pedal edema. ?Gastrointestinal system: Abdomen is nondistended, soft and nontender. No organomegaly or masses felt. Normal bowel sounds heard. ?Central nervous system: Alert and oriented. No focal neurological deficits. ?Extremities: Symmetric 5 x 5 power. ?Skin: No rashes, lesions or ulcers.  ?Psychiatry: Judgement and insight appear normal. Mood & affect appropriate.  ? ?Data Reviewed: I have personally reviewed following labs and imaging studies ? ?CBC: ?Recent Labs  ?Lab 04/28/21 ?6294 04/28/21 ?7654 04/29/21 ?0114  ?WBC 10.4 11.0* 7.4  ?NEUTROABS 8.9*  --   --   ?HGB 12.4* 11.8* 11.2*  ?HCT 36.9* 33.8* 32.4*  ?MCV 88.9 86.2 89.8  ?PLT 371 367 315  ? ? ?Basic Metabolic Panel: ?Recent Labs  ?Lab 04/28/21 ?0742 04/28/21 ?0816 04/28/21 ?1318 04/28/21 ?2007 04/29/21 ?0114 04/29/21 ?0654 04/29/21 ?1155 04/29/21 ?2026 04/30/21 ?0602  ?NA 123*  --  125* 123* 122* 122* 124* 126* 124*  ?K 4.1  --  4.5 4.0 4.3 3.9  --   --  3.9  ?CL 90*  --  90* 90* 91* 90*  --   --  89*  ?CO2 22  --  23 23 23 23   --   --  24  ?GLUCOSE 114*  --  108* 112* 105* 104*  --   --  105*  ?BUN 9  --  10 9 11 13   --   --  20  ?CREATININE 0.79  --  0.78  0.82 0.99 0.83  --   --  1.01  ?CALCIUM 9.7  --  9.5 9.7 8.9 9.1  --   --  9.6  ?MG  --  1.9  --   --   --   --   --   --   --   ?PHOS 4.0  --  3.8 3.5 3.8 3.6  --   --   --   ? ? ?GFR: ?Estimated Creatinine Clearance: 66.4 mL/min (by C-G formula based on SCr of 1.01 mg/dL). ?Liver Function Tests: ?Recent Labs  ?  Lab 04/28/21 ?9562 04/28/21 ?1308 04/28/21 ?1318 04/28/21 ?2007 04/29/21 ?0114 04/29/21 ?0654  ?AST 117*  --   --   --   --   --   ?ALT 74*  --   --   --   --   --   ?ALKPHOS 72  --   --   --   --   --   ?BILITOT 1.2  --   --   --   --   --   ?PROT 9.5*  --   --   --   --   --   ?ALBUMIN 4.7 4.4 4.0 4.0 3.7 3.8  ? ? ?No results for input(s): LIPASE, AMYLASE in the last 168 hours. ?No results for input(s): AMMONIA in the last 168 hours. ?Coagulation Profile: ?No results for input(s): INR, PROTIME in the last 168 hours. ?Cardiac Enzymes: ?No results for input(s): CKTOTAL, CKMB, CKMBINDEX, TROPONINI in the last 168 hours. ?BNP (last 3 results) ?No results for input(s): PROBNP in the last 8760 hours. ?HbA1C: ?No results for input(s): HGBA1C in the last 72 hours. ?CBG: ?No results for input(s): GLUCAP in the last 168 hours. ?Lipid Profile: ?No results for input(s): CHOL, HDL, LDLCALC, TRIG, CHOLHDL, LDLDIRECT in the last 72 hours. ?Thyroid Function Tests: ?No results for input(s): TSH, T4TOTAL, FREET4, T3FREE, THYROIDAB in the last 72 hours. ?Anemia Panel: ?No results for input(s): VITAMINB12, FOLATE, FERRITIN, TIBC, IRON, RETICCTPCT in the last 72 hours. ?Sepsis Labs: ?No results for input(s): PROCALCITON, LATICACIDVEN in the last 168 hours. ? ?No results found for this or any previous visit (from the past 240 hour(s)).  ? ?Radiology Studies: ?CT CHEST ABDOMEN PELVIS W CONTRAST ? ?Result Date: 04/28/2021 ?CLINICAL DATA:  Restaging non-small cell lung cancer. Assess treatment response. EXAM: CT CHEST, ABDOMEN, AND PELVIS WITH CONTRAST TECHNIQUE: Multidetector CT imaging of the chest, abdomen and pelvis was  performed following the standard protocol during bolus administration of intravenous contrast. RADIATION DOSE REDUCTION: This exam was performed according to the departmental dose-optimization program which i

## 2021-05-01 DIAGNOSIS — E875 Hyperkalemia: Secondary | ICD-10-CM | POA: Diagnosis not present

## 2021-05-01 LAB — COMPREHENSIVE METABOLIC PANEL
ALT: 38 U/L (ref 0–44)
AST: 42 U/L — ABNORMAL HIGH (ref 15–41)
Albumin: 3.7 g/dL (ref 3.5–5.0)
Alkaline Phosphatase: 67 U/L (ref 38–126)
Anion gap: 9 (ref 5–15)
BUN: 22 mg/dL (ref 8–23)
CO2: 24 mmol/L (ref 22–32)
Calcium: 9.6 mg/dL (ref 8.9–10.3)
Chloride: 91 mmol/L — ABNORMAL LOW (ref 98–111)
Creatinine, Ser: 0.94 mg/dL (ref 0.61–1.24)
GFR, Estimated: >60 mL/min (ref >60)
Glucose, Bld: 109 mg/dL — ABNORMAL HIGH (ref 70–99)
Potassium: 3.9 mmol/L (ref 3.5–5.1)
Sodium: 124 mmol/L — ABNORMAL LOW (ref 135–145)
Total Bilirubin: 1 mg/dL (ref 0.3–1.2)
Total Protein: 7.8 g/dL (ref 6.5–8.1)

## 2021-05-01 LAB — PREALBUMIN: Prealbumin: 13.7 mg/dL — ABNORMAL LOW (ref 18–38)

## 2021-05-01 MED ORDER — FENTANYL 12 MCG/HR TD PT72
1.0000 | MEDICATED_PATCH | TRANSDERMAL | Status: DC
  Administered 2021-05-01 – 2021-05-04 (×2): 1 via TRANSDERMAL
  Filled 2021-05-01 (×2): qty 1

## 2021-05-01 MED ORDER — FENTANYL 25 MCG/HR TD PT72: 1.0000 | MEDICATED_PATCH | TRANSDERMAL | Status: DC

## 2021-05-01 NOTE — [place] Notes (Signed)
Pain still seems to be a big problem.  He is really not able to get up. ? ?I think a bone scan could certainly help to see exactly how extensive his bony metastasis are. ? ?I think is going need some kind of long-acting pain medication.  I think a Duragesic patch would not be a bad idea for him. ? ?He is does need to have a biopsy to see exactly what is going on.  Again, I suspect this is going to be metastatic non- small cell lung cancer.  We will await the CEA level.  This may help a little bit. ? ?He is eating breakfast this morning. ? ?He has had no nausea or vomiting. ? ?His ability to get around is going to be the issue I think.  Again, a bone scan might help [place] out. ? ?I have put in the order for Zometa which hopefully he got yesterday. ? ?He still has the hyponatremia.  It is assumed that this is SIADH. ? ?All of his vital signs look stable.  Temperature 97.8.  Pulse 90.  Blood pressure 106/89.  There really is no change in his overall physical exam. ? ?Hopefully, he will be able to get more ambulatory once we know what is going on.  I know that physical therapy is trying to help him out. ? ?Again, we really need to get a biopsy to see what is going on. ? ?The CEA could certainly help out. ? ?I do appreciate the great care he is getting from all the staff on [address]. ? ?Lattie Haw, MD ? ?Psalm 4:8 ?

## 2021-05-01 NOTE — [place] Notes (Signed)
?PROGRESS NOTE ? ? ? ?Terry Page.  FGH:[id number] DOB: 08/22/1955 DOA: 04/27/2021 ?PCP: [name], MD ? ? ?Brief Narrative:  ?HPI: Terry Page. is a 66 y.o. male with medical history significant of bullous emphysema, HTN, prostate CA, EtOH abuse, LUL Lung CA. Presenting with falls. He reports that he's had falls for the past 2 days. They happen when he tries to get out of his chair. He doesn't trip. He falls secondary to pain in his lower back. He says the back pain is chronic but seems to be worsening. He hasn't try any new meds or therapies for it. He hasn't seen any one for this increase in pain prior to this presentation to the ED. He tried to get out of bed yesterday evening, but was unable to move d/t pain in his back and legs. He became concerned and called EMS. He denies any other aggravating or alleviating factors.  ? ?Assessment & Plan: ?  ?Principal Problem: ?  Hyperkalemia ?Active Problems: ?  HLD (hyperlipidemia) ?  Alcohol abuse ?  Hyponatremia ?  Malignant neoplasm of prostate ([place]) ?  Bullous emphysema ([place]) ?  Elevated LFTs ?  Falls ?  Chronic back pain ? ?Additional lesions/soft tissue mass right iliac bone and left sacrum: Given history of prostate cancer, PSA was checked and was normal.  Bone lesions thought to be unrelated to his history of prostate cancer.  In the past, he received SBRT to the left lung for presumed lung cancer (lung nodule was never biopsy).  Oncology saw patient and discussed that current findings may be related to his presumed lung cancer versus another malignancy.  CT chest abdomen pelvis ordered and completed by them, there is no new lesion that can be biopsied.  Oncology has now consulted IR for possible biopsy of the lytic lesion and has ordered bone scan. ? ?Frequent falls: Seen by PT OT.  Recommendation to send to SNF.  TOC aware. ? ?Acute on chronic hyponatremia/ ?  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  Source of SIADH remains unknown at this  point in time however pulmonary malignancy can cause this.  Sodium stable.  Monitor closely. ? ?Hyperkalemia: Resolved. ? ?Elevated LFTs: Slightly elevated, AST greater than ALT, indicating alcoholic hepatitis.  Ultrasound liver negative for any lesion. ? ?Alcohol abuse: He drinks 3-4 beers per day with last drink about 2 days ago.  No withdrawal symptoms. ? ?Essential hypertension: Controlled, continue amlodipine and irbesartan. ? ?Left lung nodule: No prior biopsy of the left nodule but he received SBRT due to lung nodule completed in April 2022.  Defer to oncology. ? 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 ?--Status post radiation to the prostate completed 10/07/2019. ? ?DVT prophylaxis: enoxaparin (LOVENOX) injection 40 mg Start: 04/28/21 1800 ?  Code Status: Full Code  ?Family Communication:  None present at bedside.  Plan of care discussed with patient in length and he/she verbalized understanding and agreed with it. ? ?Status is: Inpatient ?Remains inpatient appropriate because: Needs further work-up of possible primary malignancy with metastasis. ? ? ?Estimated body mass index is 26 kg/m? as calculated from the following: ?  Height as of this encounter: 5\' 7"  (1.702 m). ?  Weight as of this encounter: 75.3 kg. ? ?  ?Nutritional Assessment: ?Body mass index is 26 kg/m?Marland KitchenMarland Kitchen ?Seen by dietician.  I agree with the assessment and plan as outlined below: ?Nutrition Status: ?  ?  ?  ? ?. ?  Skin Assessment: ?I have examined the patient's skin and I agree with the wound assessment as performed by the wound care RN as outlined below: ?  ? ?Consultants:  ?Oncology ? ?Procedures:  ?None ? ?Antimicrobials:  ?Anti-infectives (From admission, onward)  ? ? None  ? ?  ?  ? ? ?Subjective: ? ?Patient seen and examined.  No new complaint.  Back pain is a stable. ? ?Objective: ?Vitals:  ? 04/30/21 6811 04/30/21 1411 04/30/21 2043 05/01/21 0558  ?BP: 126/82 99/78 111/79  106/89  ?Pulse: 89 96 96 90  ?Resp: 18 18 17 18   ?Temp: 97.7 ?F (36.5 ?C) 98.4 ?F (36.9 ?C) (!) 97.4 ?F (36.3 ?C) 97.8 ?F (36.6 ?C)  ?TempSrc: Oral Oral Oral Oral  ?SpO2: 99% 98% 98% 100%  ?Weight:      ?Height:      ? ? ?Intake/Output Summary (Last 24 hours) at 05/01/2021 1333 ?Last data filed at 05/01/2021 0845 ?Gross per 24 hour  ?Intake 579.63 ml  ?Output --  ?Net 579.63 ml  ? ? ?Filed Weights  ? 04/28/21 0354  ?Weight: 75.3 kg  ? ? ?Examination: ? ?General exam: Appears calm and comfortable  ?Respiratory system: Clear to auscultation. Respiratory effort normal. ?Cardiovascular system: S1 & S2 heard, RRR. No JVD, murmurs, rubs, gallops or clicks. No pedal edema. ?Gastrointestinal system: Abdomen is nondistended, soft and nontender. No organomegaly or masses felt. Normal bowel sounds heard. ?Central nervous system: Alert and oriented. No focal neurological deficits. ?Extremities: Symmetric 5 x 5 power. ?Skin: No rashes, lesions or ulcers.  ?Psychiatry: Judgement and insight appear normal. Mood & affect appropriate.  ? ?Data Reviewed: I have personally reviewed following labs and imaging studies ? ?CBC: ?Recent Labs  ?Lab 04/28/21 ?5726 04/28/21 ?2035 04/29/21 ?0114  ?WBC 10.4 11.0* 7.4  ?NEUTROABS 8.9*  --   --   ?HGB 12.4* 11.8* 11.2*  ?HCT 36.9* 33.8* 32.4*  ?MCV 88.9 86.2 89.8  ?PLT 371 367 315  ? ? ?Basic Metabolic Panel: ?Recent Labs  ?Lab 04/28/21 ?0742 04/28/21 ?0816 04/28/21 ?1318 04/28/21 ?2007 04/29/21 ?0114 04/29/21 ?0654 04/29/21 ?1155 04/29/21 ?2026 04/30/21 ?0602 04/30/21 ?1148 04/30/21 ?1952 05/01/21 ?5974  ?NA 123*  --  125* 123* 122* 122*   < > 126* 124* 125* 124* 124*  ?K 4.1  --  4.5 4.0 4.3 3.9  --   --  3.9  --   --  3.9  ?CL 90*  --  90* 90* 91* 90*  --   --  89*  --   --  91*  ?CO2 22  --  23 23 23 23   --   --  24  --   --  24  ?GLUCOSE 114*  --  108* 112* 105* 104*  --   --  105*  --   --  109*  ?BUN 9  --  10 9 11 13   --   --  20  --   --  22  ?CREATININE 0.79  --  0.78 0.82 0.99 0.83  --    --  1.01  --   --  0.94  ?CALCIUM 9.7  --  9.5 9.7 8.9 9.1  --   --  9.6  --   --  9.6  ?MG  --  1.9  --   --   --   --   --   --   --   --   --   --   ?PHOS 4.0  --  3.8 3.5 3.8 3.6  --   --   --   --   --   --   ? < > = values in this interval not displayed.  ? ? ?GFR: ?Estimated Creatinine Clearance: 71.3 mL/min (by C-G formula based on SCr of 0.94 mg/dL). ?Liver Function Tests: ?Recent Labs  ?Lab 04/28/21 ?7225 04/28/21 ?7505 04/28/21 ?1318 04/28/21 ?2007 04/29/21 ?0114 04/29/21 ?0654 05/01/21 ?1833  ?AST 117*  --   --   --   --   --  42*  ?ALT 74*  --   --   --   --   --  38  ?ALKPHOS 72  --   --   --   --   --  67  ?BILITOT 1.2  --   --   --   --   --  1.0  ?PROT 9.5*  --   --   --   --   --  7.8  ?ALBUMIN 4.7   < > 4.0 4.0 3.7 3.8 3.7  ? < > = values in this interval not displayed.  ? ? ?No results for input(s): LIPASE, AMYLASE in the last 168 hours. ?No results for input(s): AMMONIA in the last 168 hours. ?Coagulation Profile: ?No results for input(s): INR, PROTIME in the last 168 hours. ?Cardiac Enzymes: ?No results for input(s): CKTOTAL, CKMB, CKMBINDEX, TROPONINI in the last 168 hours. ?BNP (last 3 results) ?No results for input(s): PROBNP in the last 8760 hours. ?HbA1C: ?No results for input(s): HGBA1C in the last 72 hours. ?CBG: ?No results for input(s): GLUCAP in the last 168 hours. ?Lipid Profile: ?No results for input(s): CHOL, HDL, LDLCALC, TRIG, CHOLHDL, LDLDIRECT in the last 72 hours. ?Thyroid Function Tests: ?No results for input(s): TSH, T4TOTAL, FREET4, T3FREE, THYROIDAB in the last 72 hours. ?Anemia Panel: ?No results for input(s): VITAMINB12, FOLATE, FERRITIN, TIBC, IRON, RETICCTPCT in the last 72 hours. ?Sepsis Labs: ?No results for input(s): PROCALCITON, LATICACIDVEN in the last 168 hours. ? ?No results found for this or any previous visit (from the past 240 hour(s)).  ? ?Radiology Studies: ?No results found. ? ?Scheduled Meds: ? amLODipine  10 mg Oral Daily  ? enoxaparin (LOVENOX)  injection  40 mg Subcutaneous Q24H  ? fentaNYL  1 patch Transdermal P82P  ? folic acid  1 mg Oral Daily  ? irbesartan  300 mg Oral Daily  ? lidocaine  1 patch Transdermal Q24H  ? multivitamin with minerals

## 2021-05-01 NOTE — Evaluation (Signed)
Occupational Therapy Evaluation ?Patient Details ?Name: Terry Page. ?MRN: [id number] ?DOB: [birth year] ?Today's Date: 05/01/2021 ? ? ?History of Present Illness Terry Page. is a 66 y.o. male with medical history significant of bullous emphysema, HTN, prostate CA, EtOH abuse, LUL Lung CA. Presenting 04/27/21  with falls..  Patient found with hyperkalemia and hyponatremia( baseline hyponatremic).CT lumbar: . Lytic lesions with associated soft tissue mass in the right iliac bone and left sacrum extending to the SI joint concerning for metastatic disease or myeloma. No acute fracture is seen  ? ?Clinical Impression ?  ?Patient is a 66 year old male who is very limited secondary to pain levels. Patient was mod A for bed mobility and transfers with increased time. Patient was TD for LB dressing tasks. Patient's decreased functional activity tolerance, decreased endurance, decreased standing balance/tolerance impacting participation in ADLs. Patient would continue to benefit from skilled OT services at this time while admitted and after d/c to address noted deficits in order to improve overall safety and independence in ADLs.  ?  ?   ? ?Recommendations for follow up therapy are one component of a multi-disciplinary discharge planning process, led by the attending physician.  Recommendations may be updated based on patient status, additional functional criteria and insurance authorization.  ? ?Follow Up Recommendations ? Skilled nursing-short term rehab (<3 hours/day)  ?  ?Assistance Recommended at Discharge Frequent or constant Supervision/Assistance  ?Patient can return home with the following A lot of help with walking and/or transfers;A lot of help with bathing/dressing/bathroom;Assistance with cooking/housework;Direct supervision/assist for financial management;Assist for transportation;Help with stairs or ramp for entrance;Direct supervision/assist for medications management ? ?  ?Functional Status  Assessment ? Patient has had a recent decline in their functional status and demonstrates the ability to make significant improvements in function in a reasonable and predictable amount of time.  ?Equipment Recommendations ? Other (comment) (defer to next venue)  ?  ?Recommendations for Other Services   ? ? ?  ?Precautions / Restrictions Precautions ?Precautions: Fall ?Restrictions ?Weight Bearing Restrictions: No  ? ?  ? ?Mobility Bed Mobility ?Overal bed mobility: Needs Assistance ?Bed Mobility: Supine to Sit ?  ?  ?Supine to sit: Mod assist ?  ?  ?General bed mobility comments: with extra time and noted increased pain with education on moving hips and shoulders at the same time to reduce pain in back ?  ? ?Transfers ?  ?  ?  ?  ?  ?  ?  ?  ?  ?  ?  ? ?  ?Balance Overall balance assessment: Needs assistance, History of Falls ?Sitting-balance support: Feet supported, Bilateral upper extremity supported ?Sitting balance-Leahy Scale: Fair ?  ?  ?Standing balance support: During functional activity, Bilateral upper extremity supported, Reliant on assistive device for balance ?Standing balance-Leahy Scale: Poor ?  ?  ?  ?  ?  ?  ?  ?  ?  ?  ?  ?  ?   ? ?ADL either performed or assessed with clinical judgement  ? ?ADL Overall ADL's : Needs assistance/impaired ?Eating/Feeding: Set up;Sitting ?  ?Grooming: Set up;Sitting ?  ?Upper Body Bathing: Moderate assistance;Sitting ?  ?Lower Body Bathing: Sit to/from stand;Sitting/lateral leans;Maximal assistance ?  ?Upper Body Dressing : Minimal assistance;Sitting ?  ?Lower Body Dressing: Sit to/from stand;Sitting/lateral leans;Maximal assistance ?  ?Toilet Transfer: Moderate assistance;Rolling walker (2 wheels) ?Toilet Transfer Details (indicate cue type and reason): transfer from edge of bed to recliner in room with increased time  and encouragement. ?Toileting- Clothing Manipulation and Hygiene: Maximal assistance;Sit to/from stand ?  ?  ?  ?  ?   ? ? ? ?Vision Baseline  Vision/History: 1 Wears glasses ?Patient Visual Report: No change from baseline ?   ?   ?Perception   ?  ?Praxis   ?  ? ?Pertinent Vitals/Pain Pain Assessment ?Pain Assessment: 0-10 ?Pain Score: 8  ?Pain Location: low back ?Pain Descriptors / Indicators: Discomfort, Grimacing ?Pain Intervention(s): Monitored during session, Premedicated before session  ? ? ? ?Hand Dominance Right ?  ?Extremity/Trunk Assessment Upper Extremity Assessment ?Upper Extremity Assessment: Overall WFL for tasks assessed ?  ?Lower Extremity Assessment ?Lower Extremity Assessment: Defer to PT evaluation ?  ?Cervical / Trunk Assessment ?Cervical / Trunk Assessment: Normal ?  ?Communication Communication ?Communication: No difficulties ?  ?Cognition Arousal/Alertness: Awake/alert ?Behavior During Therapy: [facility] for tasks assessed/performed, Flat affect ?Overall Cognitive Status: No family/caregiver present to determine baseline cognitive functioning ?  ?  ?  ?  ?  ?  ?  ?  ?  ?  ?  ?  ?  ?  ?  ?  ?  ?  ?  ?General Comments    ? ?  ?Exercises   ?  ?Shoulder Instructions    ? ? ?Home Living Family/patient expects to be discharged to:: Private residence ?Living Arrangements: Alone ?Available Help at Discharge: Available PRN/intermittently;Family ?Type of Home: Apartment ?Home Access: Level entry;Stairs to enter ?Entrance Stairs-Number of Steps: 2 steps to front door ?  ?Home Layout: One level ?  ?  ?Bathroom Shower/Tub: Tub/shower unit ?  ?Bathroom Toilet: Standard ?  ?  ?Home Equipment: Rollator (4 wheels);Grab bars - tub/shower;Shower seat ?  ?  ?  ? ?  ?Prior Functioning/Environment Prior Level of Function : Independent/Modified Independent ?  ?  ?  ?  ?  ?  ?  ?ADLs Comments: independent ?  ? ?  ?  ?OT Problem List: Decreased activity tolerance;Impaired balance (sitting and/or standing);Decreased safety awareness;Pain;Decreased knowledge of precautions;Decreased knowledge of use of DME or AE ?  ?   ?OT Treatment/Interventions: Self-care/ADL  training;Therapeutic exercise;Neuromuscular education;Energy conservation;DME and/or AE instruction;Therapeutic activities;Balance training;Patient/family education  ?  ?OT Goals(Current goals can be found in the care plan section) Acute Rehab OT Goals ?Patient Stated Goal: to get pain under control ?OT Goal Formulation: With patient ?Time For Goal Achievement: 05/15/21 ?Potential to Achieve Goals: Good  ?OT Frequency: Min 2X/week ?  ? ?Co-evaluation   ?  ?  ?  ?  ? ?  ?AM-PAC OT "6 Clicks" Daily Activity     ?Outcome Measure Help from another person eating meals?: A Little ?Help from another person taking care of personal grooming?: A Little ?Help from another person toileting, which includes using toliet, bedpan, or urinal?: A Lot ?Help from another person bathing (including washing, rinsing, drying)?: A Lot ?Help from another person to put on and taking off regular upper body clothing?: A Lot ?Help from another person to put on and taking off regular lower body clothing?: A Lot ?6 Click Score: 14 ?  ?End of Session Equipment Utilized During Treatment: Rolling walker (2 wheels) ?Nurse Communication: Patient requests pain meds ? ?Activity Tolerance: Patient limited by pain ?Patient left: in chair;with call bell/phone within reach;with chair alarm set ? ?OT Visit Diagnosis: Unsteadiness on feet (R26.81);Muscle weakness (generalized) (M62.81);Pain;History of falling (Z91.81)  ?              ?Time: 7062-3762 ?OT Time Calculation (min):  17 min ?Charges:  OT General Charges ?$OT Visit: 1 Visit ?OT Evaluation ?$OT Eval Moderate Complexity: 1 Mod ? ?Terry Page OTR/L, MS ?Acute Rehabilitation Department ?Office# [phone] ?Pager# [phone] ? ? ?[place] ?05/01/2021, 12:41 PM ?

## 2021-05-01 NOTE — Progress Notes (Signed)
PT Cancellation Note ? ?Patient Details ?Name: Terry Page. ?MRN: [id number] ?DOB: 1955-08-04 ? ? ?Cancelled Treatment:     Pt worked with Occupational Therapy earlier.  "I was up once already" now back in bed and declined to "do it again".  Pt plans to D/C to SNF.  Will continue to follow and attempt another day as schedule permits. ? ? ?Nathanial Rancher ?05/01/2021, 3:03 PM ? ? ?

## 2021-05-02 ENCOUNTER — Inpatient Hospital Stay (HOSPITAL_COMMUNITY): Payer: Medicare HMO

## 2021-05-02 DIAGNOSIS — E875 Hyperkalemia: Secondary | ICD-10-CM | POA: Diagnosis not present

## 2021-05-02 LAB — MULTIPLE MYELOMA PANEL, SERUM
Albumin SerPl Elph-Mcnc: 3.7 g/dL (ref 2.9–4.4)
Albumin/Glob SerPl: 1 (ref 0.7–1.7)
Alpha 1: 0.3 g/dL (ref 0.0–0.4)
Alpha2 Glob SerPl Elph-Mcnc: 0.6 g/dL (ref 0.4–1.0)
B-Globulin SerPl Elph-Mcnc: 1.3 g/dL (ref 0.7–1.3)
Gamma Glob SerPl Elph-Mcnc: 1.8 g/dL (ref 0.4–1.8)
Globulin, Total: 4 g/dL — ABNORMAL HIGH (ref 2.2–3.9)
IgA: 714 mg/dL — ABNORMAL HIGH (ref 61–437)
IgG (Immunoglobin G), Serum: 1834 mg/dL — ABNORMAL HIGH (ref 603–1613)
IgM (Immunoglobulin M), Srm: 32 mg/dL (ref 20–172)
Total Protein ELP: 7.7 g/dL (ref 6.0–8.5)

## 2021-05-02 LAB — CEA: CEA: 77.1 ng/mL — ABNORMAL HIGH (ref 0.0–4.7)

## 2021-05-02 MED ORDER — TECHNETIUM TC 99M MEDRONATE IV KIT
22.1000 | PACK | Freq: Once | INTRAVENOUS | Status: AC
  Administered 2021-05-02: 22 via INTRAVENOUS

## 2021-05-02 MED ORDER — ENOXAPARIN SODIUM 40 MG/0.4ML IJ SOSY
40.0000 mg | PREFILLED_SYRINGE | INTRAMUSCULAR | Status: DC
  Administered 2021-05-03 – 2021-05-06 (×4): 40 mg via SUBCUTANEOUS
  Filled 2021-05-02 (×5): qty 0.4

## 2021-05-02 MED ORDER — ENOXAPARIN SODIUM 40 MG/0.4ML IJ SOSY: 40.0000 mg | PREFILLED_SYRINGE | INTRAMUSCULAR | Status: DC

## 2021-05-02 MED ORDER — SODIUM CHLORIDE 0.9 % IV BOLUS
250.0000 mL | Freq: Once | INTRAVENOUS | Status: AC
  Administered 2021-05-02: 250 mL via INTRAVENOUS

## 2021-05-02 NOTE — Consult Note (Signed)
? ?Chief Complaint: ?Patient was seen in consultation today for CT-guided biopsy of right pelvic/iliac bone lesion ?Chief Complaint  ?Patient presents with  ? Back Pain  ?  ? ?Referring Physician(s): ?Ennever,P ? ?Supervising Physician: Michaelle Birks ? ?Patient Status: [facility] - In-pt ? ?History of Present Illness: ?Terry Page. is a 66 y.o. 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  Patient states he was unable to get out of bed due to pain in his back and legs and EMS was called.  Imaging revealed multifocal lytic bone metastases and chronic left hydronephrosis with dilatation of the extrarenal pelvis to the left UPJ. ?Latest CEA 77.1.  Request now received from oncology for bony lesion biopsy.  Bone scan pending today. ? ? ?Past Medical History:  ?Diagnosis Date  ? Alcohol abuse   ? Asthma   ? Bullous emphysema ([place])   ? Essential hypertension 04/10/2020  ? Hemorrhoids   ? History of radiation therapy 04/08/20-04/19/20  ? IMRT- Left lung- Dr. Gery Pray  ? Incidental lung nodule, greater than or equal to 27mm 10/22/2017  ? Left upper lobe - discovered on CTA  ? Prostate cancer ([place])   ? Spontaneous pneumothorax 10/20/2017  ? right  ? Tobacco abuse   ? ? ?Past Surgical History:  ?Procedure Laterality Date  ? BACK SURGERY    ? PROSTATE BIOPSY    ? ? ?Allergies: ?Patient has no known allergies. ? ?Medications: ?Prior to Admission medications   ?Medication Sig Start Date End Date Taking? Authorizing Provider  ?acetaminophen (TYLENOL) 325 MG tablet Take 2 tablets (650 mg total) by mouth every 6 (six) hours as needed for mild pain or headache (fever >/= 101). 05/10/20  Yes Arrien, Jimmy Picket, MD  ?albuterol (VENTOLIN HFA) 108 (90 Base) MCG/ACT inhaler Inhale 2  puffs into the lungs every 6 (six) hours as needed for wheezing or shortness of breath. 06/06/19 04/29/22 Yes Elodia Florence., MD  ?amLODipine (NORVASC) 10 MG tablet Take 1 tablet by mouth daily. 06/14/20  Yes [provider]  ?atorvastatin (LIPITOR) 20 MG tablet Take 20 mg by mouth daily. 01/28/19  Yes [provider]  ?ipratropium-albuterol (DUONEB) 0.5-2.5 (3) MG/3ML SOLN Take 3 mLs by nebulization every 6 (six) hours as needed. ?Patient taking differently: Take 3 mLs by nebulization every 6 (six) hours as needed (shortness of breathing or wheezing). 05/10/20  Yes Arrien, Jimmy Picket, MD  ?olmesartan (BENICAR) 40 MG tablet Take 40 mg by mouth daily. 03/03/21  Yes [provider]  ?  ? ?Family History  ?Problem Relation Age of Onset  ? Cancer Cousin   ?     maternal cousin  ? Cancer Cousin   ?     paternal cousin  ? Cancer Cousin   ? Colon polyps Neg Hx   ? Pancreatic disease Neg Hx   ? Pancreatic cancer Neg Hx   ? Breast cancer Neg Hx   ? Colon cancer Neg Hx   ? ? ?Social History  ? ?Socioeconomic History  ? Marital status: Single  ?  Spouse name: Not on file  ? Number of children: Not on file  ? Years of education: Not on file  ? Highest education level: Not on file  ?Occupational History  ?  Occupation: retired  ?Tobacco Use  ? Smoking status: Former  ?  Types: Cigarettes  ?  Quit date: 11/22/2019  ?  Years since quitting: 1.4  ? Smokeless tobacco: Never  ? Tobacco comments:  ?  Patient reports quit 3 years ago. 06/25/20. [facility]  ?Vaping Use  ? Vaping Use: Never used  ?Substance and Sexual Activity  ? Alcohol use: Yes  ?  Alcohol/week: 12.0 standard drinks  ?  Types: 12 Cans of beer per week  ?  Comment: daily  ? Drug use: No  ? Sexual activity: Not Currently  ?Other Topics Concern  ? Not on file  ?Social History Narrative  ? Not on file  ? ?Social Determinants of Health  ? ?Financial Resource Strain: Not on file  ?Food Insecurity: Not on file  ?Transportation Needs: Not on file   ?Physical Activity: Not on file  ?Stress: Not on file  ?Social Connections: Not on file  ? ? ? ? ?Review of Systems currently denies fever, headache, chest pain, dyspnea, cough, abdominal pain, nausea, vomiting or bleeding.  He continues to have back pain. ? ?Vital Signs: ?BP 115/81 (BP Location: Right Arm)   Pulse 94   Temp 98.1 ?F (36.7 ?C) (Oral)   Resp 14   Ht 5\' 7"  (1.702 m)   Wt 166 lb 0.1 oz (75.3 kg)   SpO2 97%   BMI 26.00 kg/m?  ? ?Physical Exam awake, alert.  Chest clear to auscultation bilaterally.  Heart with normal rate, occasional ectopy.  Abdomen protuberant, somewhat taut.  Positive bowel sounds, nontender.  No significant lower extremity edema. ? ?Imaging: ?CT Lumbar Spine Wo Contrast ? ?Result Date: 04/27/2021 ?CLINICAL DATA:  Low back pain EXAM: CT LUMBAR SPINE WITHOUT CONTRAST TECHNIQUE: Multidetector CT imaging of the lumbar spine was performed without intravenous contrast administration. Multiplanar CT image reconstructions were also generated. RADIATION DOSE REDUCTION: This exam was performed according to the departmental dose-optimization program which includes automated exposure control, adjustment of the mA and/or kV according to patient size and/or use of iterative reconstruction technique. COMPARISON:  CT 04/25/2020 and MRI spine 04/24/2020 FINDINGS: Segmentation: 5 lumbar type vertebrae. Alignment: Normal. Vertebrae: No fracture. Lytic lesion with soft tissue mass in the right iliac bone. Similar appearing lytic lesion in the left sacrum extending to the SI joint. Paraspinal and other soft tissues: Advanced aortic atherosclerosis. Disc levels: At L1-L2, maintained disc space. No canal stenosis. The foramen are patent. At L2-L3, maintained disc space. Mild diffuse disc bulge. Bilateral facet degenerative changes. Mild canal stenosis. The foramen are patent bilaterally. At L3-L4, maintained disc space. Mild diffuse disc bulge. Facet degenerative changes bilaterally. No significant  canal stenosis. The foramen are patent bilaterally. At L4-L5, maintained disc space. Mild diffuse disc bulge. No high-grade canal stenosis. Hypertrophic facet degenerative changes bilaterally. Mild left foraminal narrowing. At L5-S1, maintained disc space. No canal stenosis. Hypertrophic facet degenerative changes. The foramen are patent bilaterally. IMPRESSION: 1. Lytic lesions with associated soft tissue mass in the right iliac bone and left sacrum extending to the SI joint concerning for metastatic disease or myeloma. No acute fracture is seen. 2. Mild multilevel degenerative changes. Electronically Signed   By: Donavan Foil M.D.   On: 04/27/2021 23:01  ? ?CT CHEST ABDOMEN PELVIS W CONTRAST ? ?Result Date: 04/28/2021 ?CLINICAL DATA:  Restaging non-small cell lung cancer. Assess treatment response. EXAM: CT CHEST, ABDOMEN, AND PELVIS WITH CONTRAST TECHNIQUE: Multidetector CT imaging of the chest, abdomen and pelvis was performed following the standard  protocol during bolus administration of intravenous contrast. RADIATION DOSE REDUCTION: This exam was performed according to the departmental dose-optimization program which includes automated exposure control, adjustment of the mA and/or kV according to patient size and/or use of iterative reconstruction technique. CONTRAST:  163mL OMNIPAQUE IOHEXOL 300 MG/ML  SOLN COMPARISON:  CT of the chest from 02/03/2021.  PET-CT 02/06/2020 FINDINGS: CT CHEST FINDINGS Cardiovascular: Heart size and mediastinal contours are unremarkable. Aortic atherosclerosis and coronary artery calcifications. No pericardial effusion. Mediastinum/Nodes: Thyroid gland, trachea, and esophagus are unremarkable. No enlarged axillary, supraclavicular, mediastinal, or hilar lymph nodes. Lungs/Pleura: No pleural effusion. No airspace consolidation. Moderate to severe changes of paraseptal and emphysema with bullous change. Further retraction of fibrosis and architectural distortion within the central  left upper lobe is identified corresponding to the treated tumor site. Central nodular density in this area measures 1.1 x 0.8 cm, image 44/4. On 02/03/2021 this measured 1.2 x 1.1 cm. Soft tissue thickening along the

## 2021-05-02 NOTE — Progress Notes (Signed)
PT Cancellation Note ? ?Patient Details ?Name: Terry Page. ?MRN: [id number] ?DOB: 07-01-1955 ? ? ?Cancelled Treatment:    Reason Eval/Treat Not Completed: Pain limiting ability to participate (Pt reporting 8/10 pain and grimacing while laying in bed.) Pt plans to D/C to SNF, will follow up as schedule allows.  ? ?Coolidge Breeze, PT, DPT ?[facility] Rehabilitation Department ?Office: [phone] ?Pager: [phone] ? ?Coolidge Breeze ?05/02/2021, 10:00 AM ?

## 2021-05-02 NOTE — Progress Notes (Signed)
?PROGRESS NOTE ? ? ? ?Terry Page.  OMV:[id number] DOB: 12-20-1955 DOA: 04/27/2021 ?PCP: [name], MD ? ? ?Brief Narrative:  ?HPI: Terry Amory. is a 66 y.o. male with medical history significant of bullous emphysema, HTN, prostate CA, EtOH abuse, LUL Lung CA. Presenting with falls. He reports that he's had falls for the past 2 days. They happen when he tries to get out of his chair. He doesn't trip. He falls secondary to pain in his lower back. He says the back pain is chronic but seems to be worsening. He hasn't try any new meds or therapies for it. He hasn't seen any one for this increase in pain prior to this presentation to the ED. He tried to get out of bed yesterday evening, but was unable to move d/t pain in his back and legs. He became concerned and called EMS. He denies any other aggravating or alleviating factors.  ? ?Assessment & Plan: ?  ?Principal Problem: ?  Hyperkalemia ?Active Problems: ?  HLD (hyperlipidemia) ?  Alcohol abuse ?  Hyponatremia ?  Malignant neoplasm of prostate ([place]) ?  Bullous emphysema ([place]) ?  Elevated LFTs ?  Falls ?  Chronic back pain ? ?Additional lesions/soft tissue mass right iliac bone and left sacrum: Given history of prostate cancer, PSA was checked and was normal.  Bone lesions thought to be unrelated to his history of prostate cancer.  In the past, he received SBRT to the left lung for presumed lung cancer (lung nodule was never biopsy).  Oncology saw patient and discussed that current findings may be related to his presumed lung cancer versus another malignancy.  CT chest abdomen pelvis ordered and completed by them, there is no new lesion that can be biopsied.  Oncology has now consulted IR for possible biopsy of the lytic lesion and has ordered bone scan.  Patient's CEA is highly elevated. ? ?Frequent falls: Seen by PT OT.  Recommendation to send to SNF.  TOC aware. ? ?Acute on chronic hyponatremia/ ?  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  Source  of SIADH remains unknown at this point in time however pulmonary malignancy can cause this.  Sodium stable.  Monitor closely. ? ?Hyperkalemia: Resolved. ? ?Elevated LFTs: Slightly elevated, AST greater than ALT, indicating alcoholic hepatitis.  Ultrasound liver negative for any lesion. ? ?Alcohol abuse: He drinks 3-4 beers per day with last drink about 2 days ago.  No withdrawal symptoms. ? ?Essential hypertension: Controlled, continue amlodipine and irbesartan. ? ?Left lung nodule: No prior biopsy of the left nodule but he received SBRT due to lung nodule completed in April 2022.  Defer to oncology. ? 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 ?--Status post radiation to the prostate completed 10/07/2019. ? ?DVT prophylaxis: enoxaparin (LOVENOX) injection 40 mg Start: 04/28/21 1800 ?  Code Status: Full Code  ?Family Communication:  None present at bedside.  Plan of care discussed with patient in length and he/she verbalized understanding and agreed with it. ? ?Status is: Inpatient ?Remains inpatient appropriate because: Needs further work-up driven by oncology. ? ? ?Estimated body mass index is 26 kg/m? as calculated from the following: ?  Height as of this encounter: 5\' 7"  (1.702 m). ?  Weight as of this encounter: 75.3 kg. ? ?  ?Nutritional Assessment: ?Body mass index is 26 kg/m?Marland KitchenMarland Kitchen ?Seen by dietician.  I agree with the assessment and plan as outlined below: ?Nutrition Status: ?  ?  ?  ? ?. ?  Skin Assessment: ?I have examined the patient's skin and I agree with the wound assessment as performed by the wound care RN as outlined below: ?  ? ?Consultants:  ?Oncology ? ?Procedures:  ?None ? ?Antimicrobials:  ?Anti-infectives (From admission, onward)  ? ? None  ? ?  ?  ? ? ?Subjective: ? ?Patient seen and examined.  No new complaint.  Back pain is stable. ? ?Objective: ?Vitals:  ? 05/01/21 1408 05/01/21 1430 05/01/21 1942 05/02/21 0433  ?BP: (!) 87/70 104/70  101/73 115/81  ?Pulse: 98 92 (!) 103 94  ?Resp: 20 18 14 14   ?Temp: 98.5 ?F (36.9 ?C)  98.1 ?F (36.7 ?C) 98.1 ?F (36.7 ?C)  ?TempSrc: Oral  Oral Oral  ?SpO2: 96% 95% 96% 97%  ?Weight:      ?Height:      ? ? ?Intake/Output Summary (Last 24 hours) at 05/02/2021 1203 ?Last data filed at 05/01/2021 1800 ?Gross per 24 hour  ?Intake 480 ml  ?Output --  ?Net 480 ml  ? ? ?Filed Weights  ? 04/28/21 0354  ?Weight: 75.3 kg  ? ? ?Examination: ? ?General exam: Appears calm and comfortable  ?Respiratory system: Clear to auscultation. Respiratory effort normal. ?Cardiovascular system: S1 & S2 heard, RRR. No JVD, murmurs, rubs, gallops or clicks. No pedal edema. ?Gastrointestinal system: Abdomen is nondistended, soft and nontender. No organomegaly or masses felt. Normal bowel sounds heard. ?Central nervous system: Alert and oriented. No focal neurological deficits. ?Extremities: Symmetric 5 x 5 power. ?Skin: No rashes, lesions or ulcers.  ?Psychiatry: Judgement and insight appear normal. Mood & affect appropriate.  ? ?Data Reviewed: I have personally reviewed following labs and imaging studies ? ?CBC: ?Recent Labs  ?Lab 04/28/21 ?3500 04/28/21 ?9381 04/29/21 ?0114  ?WBC 10.4 11.0* 7.4  ?NEUTROABS 8.9*  --   --   ?HGB 12.4* 11.8* 11.2*  ?HCT 36.9* 33.8* 32.4*  ?MCV 88.9 86.2 89.8  ?PLT 371 367 315  ? ? ?Basic Metabolic Panel: ?Recent Labs  ?Lab 04/28/21 ?0742 04/28/21 ?0816 04/28/21 ?1318 04/28/21 ?2007 04/29/21 ?0114 04/29/21 ?0654 04/29/21 ?1155 04/29/21 ?2026 04/30/21 ?0602 04/30/21 ?1148 04/30/21 ?1952 05/01/21 ?8299  ?NA 123*  --  125* 123* 122* 122*   < > 126* 124* 125* 124* 124*  ?K 4.1  --  4.5 4.0 4.3 3.9  --   --  3.9  --   --  3.9  ?CL 90*  --  90* 90* 91* 90*  --   --  89*  --   --  91*  ?CO2 22  --  23 23 23 23   --   --  24  --   --  24  ?GLUCOSE 114*  --  108* 112* 105* 104*  --   --  105*  --   --  109*  ?BUN 9  --  10 9 11 13   --   --  20  --   --  22  ?CREATININE 0.79  --  0.78 0.82 0.99 0.83  --   --  1.01  --   --   0.94  ?CALCIUM 9.7  --  9.5 9.7 8.9 9.1  --   --  9.6  --   --  9.6  ?MG  --  1.9  --   --   --   --   --   --   --   --   --   --   ?PHOS 4.0  --  3.8 3.5 3.8  3.6  --   --   --   --   --   --   ? < > = values in this interval not displayed.  ? ? ?GFR: ?Estimated Creatinine Clearance: 71.3 mL/min (by C-G formula based on SCr of 0.94 mg/dL). ?Liver Function Tests: ?Recent Labs  ?Lab 04/28/21 ?3291 04/28/21 ?9166 04/28/21 ?1318 04/28/21 ?2007 04/29/21 ?0114 04/29/21 ?0654 05/01/21 ?0600  ?AST 117*  --   --   --   --   --  42*  ?ALT 74*  --   --   --   --   --  38  ?ALKPHOS 72  --   --   --   --   --  67  ?BILITOT 1.2  --   --   --   --   --  1.0  ?PROT 9.5*  --   --   --   --   --  7.8  ?ALBUMIN 4.7   < > 4.0 4.0 3.7 3.8 3.7  ? < > = values in this interval not displayed.  ? ? ?No results for input(s): LIPASE, AMYLASE in the last 168 hours. ?No results for input(s): AMMONIA in the last 168 hours. ?Coagulation Profile: ?No results for input(s): INR, PROTIME in the last 168 hours. ?Cardiac Enzymes: ?No results for input(s): CKTOTAL, CKMB, CKMBINDEX, TROPONINI in the last 168 hours. ?BNP (last 3 results) ?No results for input(s): PROBNP in the last 8760 hours. ?HbA1C: ?No results for input(s): HGBA1C in the last 72 hours. ?CBG: ?No results for input(s): GLUCAP in the last 168 hours. ?Lipid Profile: ?No results for input(s): CHOL, HDL, LDLCALC, TRIG, CHOLHDL, LDLDIRECT in the last 72 hours. ?Thyroid Function Tests: ?No results for input(s): TSH, T4TOTAL, FREET4, T3FREE, THYROIDAB in the last 72 hours. ?Anemia Panel: ?No results for input(s): VITAMINB12, FOLATE, FERRITIN, TIBC, IRON, RETICCTPCT in the last 72 hours. ?Sepsis Labs: ?No results for input(s): PROCALCITON, LATICACIDVEN in the last 168 hours. ? ?No results found for this or any previous visit (from the past 240 hour(s)).  ? ?Radiology Studies: ?No results found. ? ?Scheduled Meds: ? amLODipine  10 mg Oral Daily  ? enoxaparin (LOVENOX) injection  40 mg  Subcutaneous Q24H  ? fentaNYL  1 patch Transdermal K59X  ? folic acid  1 mg Oral Daily  ? irbesartan  300 mg Oral Daily  ? lidocaine  1 patch Transdermal Q24H  ? multivitamin with minerals  1 tablet Oral Daily

## 2021-05-02 NOTE — Progress Notes (Signed)
PT Cancellation Note ? ?Patient Details ?Name: Terry Page. ?MRN: [id number] ?DOB: February 04, 1955 ? ? ?Cancelled Treatment:    Reason Eval/Treat Not Completed: Patient declined, no reason specified, reporting "today is not a good day, come back tomorrow." Educated pt on importance of PT session and evaluation to determine eligibility for SNF placement, pt adamant. Will follow up on another day as schedule allows.  ? ?Coolidge Breeze, PT, DPT ?[facility] ?Office: [phone] ?Pager: [phone] ? ? ?Coolidge Breeze ?05/02/2021, 2:44 PM ?

## 2021-05-02 NOTE — Progress Notes (Signed)
PT Cancellation Note ? ?Patient Details ?Name: Terry Page. ?MRN: [id number] ?DOB: [birth year] ? ? ?Cancelled Treatment:    Reason Eval/Treat Not Completed: Pain limiting ability to participate, pt "waiting on the next pain medication." Pt scheduled for bone scan soon, will check back as schedule allows, possibly on another day.  ? ? ?Coolidge Breeze, PT, DPT ?[facility] ?Office: [phone] ?Pager: [phone] ? ?Coolidge Breeze ?05/02/2021, 11:51 AM ?

## 2021-05-03 ENCOUNTER — Inpatient Hospital Stay (HOSPITAL_COMMUNITY): Payer: Medicare HMO

## 2021-05-03 DIAGNOSIS — E875 Hyperkalemia: Secondary | ICD-10-CM | POA: Diagnosis not present

## 2021-05-03 LAB — PROTIME-INR
INR: 1.1 (ref 0.8–1.2)
Prothrombin Time: 13.6 seconds (ref 11.4–15.2)

## 2021-05-03 LAB — BASIC METABOLIC PANEL
Anion gap: 9 (ref 5–15)
BUN: 39 mg/dL — ABNORMAL HIGH (ref 8–23)
CO2: 22 mmol/L (ref 22–32)
Calcium: 8.5 mg/dL — ABNORMAL LOW (ref 8.9–10.3)
Chloride: 92 mmol/L — ABNORMAL LOW (ref 98–111)
Creatinine, Ser: 1.89 mg/dL — ABNORMAL HIGH (ref 0.61–1.24)
GFR, Estimated: 38 mL/min — ABNORMAL LOW (ref >60)
Glucose, Bld: 109 mg/dL — ABNORMAL HIGH (ref 70–99)
Potassium: 3.8 mmol/L (ref 3.5–5.1)
Sodium: 123 mmol/L — ABNORMAL LOW (ref 135–145)

## 2021-05-03 LAB — VITAMIN B1: Vitamin B1 (Thiamine): 55.3 nmol/L — ABNORMAL LOW (ref 66.5–200.0)

## 2021-05-03 MED ORDER — LIDOCAINE HCL 1 % IJ SOLN
INTRAMUSCULAR | Status: AC | PRN
Start: 2021-05-03 — End: 2021-05-03
  Administered 2021-05-03: 10 mL via INTRADERMAL

## 2021-05-03 MED ORDER — SODIUM CHLORIDE 0.9 % IV SOLN: INTRAVENOUS | Status: DC

## 2021-05-03 MED ORDER — FENTANYL CITRATE (PF) 100 MCG/2ML IJ SOLN
INTRAMUSCULAR | Status: AC | PRN
  Administered 2021-05-03: 25 ug via INTRAVENOUS

## 2021-05-03 MED ORDER — DIPHENHYDRAMINE HCL 50 MG/ML IJ SOLN
12.5000 mg | Freq: Four times a day (QID) | INTRAMUSCULAR | Status: DC | PRN
  Administered 2021-05-03 – 2021-05-05 (×4): 12.5 mg via INTRAVENOUS
  Filled 2021-05-03 (×4): qty 1

## 2021-05-03 MED ORDER — FENTANYL CITRATE (PF) 100 MCG/2ML IJ SOLN
INTRAMUSCULAR | Status: AC | PRN
Start: 2021-05-03 — End: 2021-05-03
  Administered 2021-05-03: 25 ug via INTRAVENOUS

## 2021-05-03 MED ORDER — NALOXONE HCL 0.4 MG/ML IJ SOLN
INTRAMUSCULAR | Status: DC
Start: 2021-05-03 — End: 2021-05-03
  Filled 2021-05-03: qty 1

## 2021-05-03 MED ORDER — MIDAZOLAM HCL 2 MG/2ML IJ SOLN
INTRAMUSCULAR | Status: AC
  Filled 2021-05-03: qty 2

## 2021-05-03 MED ORDER — FENTANYL CITRATE (PF) 100 MCG/2ML IJ SOLN
INTRAMUSCULAR | Status: AC
  Filled 2021-05-03: qty 2

## 2021-05-03 MED ORDER — FLUMAZENIL 0.5 MG/5ML IV SOLN
INTRAVENOUS | Status: AC
  Filled 2021-05-03: qty 5

## 2021-05-03 NOTE — Progress Notes (Addendum)
?PROGRESS NOTE ? ? ? ?Terry Page.  IRS:[id number] DOB: Sep 07, 1955 DOA: 04/27/2021 ?PCP: [name], MD ? ? ?Brief Narrative:  ?Terry Page. is a 66 y.o. male with medical history significant of bullous emphysema, HTN, prostate CA, EtOH abuse, LUL Lung CA. Presented with falls. He reports that he's had falls for the past 2 days. He falls secondary to pain in his lower back. He says the back pain is chronic but seems to be worsening.  He was hemodynamically stable upon arrival and was found to have right lesions with associated soft tissue mass in the right iliac bone and left sacrum extending to the SI joint concerning for metastatic disease or myeloma.  Oncology on board and IR is consulted for biopsy ? ?Assessment & Plan: ?  ?Principal Problem: ?  Hyperkalemia ?Active Problems: ?  HLD (hyperlipidemia) ?  Alcohol abuse ?  Hyponatremia ?  Malignant neoplasm of prostate ([place]) ?  Bullous emphysema ([place]) ?  Elevated LFTs ?  Falls ?  Chronic back pain ? ?Naitik lesions/soft tissue mass right iliac bone and left sacrum: Given history of prostate cancer, PSA was checked and was normal.  Bone lesions thought to be unrelated to his history of prostate cancer.  In the past, he received SBRT to the left lung for presumed lung cancer (lung nodule was never biopsy).  Oncology saw patient and discussed that current findings may be related to his presumed lung cancer versus another malignancy.  CT chest abdomen pelvis ordered and completed by them, there is no new lesion that can be biopsied.  Oncology has now consulted IR for possible biopsy of the lytic lesion.  Bone scan is consulted which did not show anything new.  Patient's CEA is highly elevated.  Oncology is driving the care. ? ?Frequent falls: Seen by PT OT.  Recommendation to send to SNF.  TOC aware.  I was informed that patient is declining to be seen by PT OT for last 2 days due to back pain.  Pain is controlled with the morphine though. ? ?Acute on  chronic hyponatremia/ ?  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  Source of SIADH remains unknown at this point in time however pulmonary malignancy can cause this.  Sodium is stable around 124 per last check.  Checking BMP today. ? ?Hyperkalemia: Resolved. ? ?Elevated LFTs: Slightly elevated, AST greater than ALT, indicating alcoholic hepatitis.  Ultrasound liver negative for any lesion. ? ?Alcohol abuse: He drinks 3-4 beers per day with last drink about 2 days prior to admission.  No withdrawal symptoms. ? ?Essential hypertension: Patient has been having intermittent hypotension.  I will discontinue his irbesartan as well as amlodipine.  This could be due to opioids/he is not fentanyl patch ordered by oncology. ? ?Left lung nodule: No prior biopsy of the left nodule but he received SBRT due to lung nodule completed in April 2022.  Defer to oncology. ? 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 ?--Status post radiation to the prostate completed 10/07/2019. ? ?Itching resulting in scratching causing skin tear to bilateral lower extremity and buttocks/sacrum: Has been seen by wound care and they are not pressure injuries.  I am going to order Benadryl as needed. ? ?Addendum: Patient's BMP just resulted and shows elevated creatinine of 1.89, likely due to hypoperfusion in the setting of hypotension.  Antihypertensives have already been discontinued.  I will discharge him on IV hydration.  Repeat  labs in the morning. ? ?DVT prophylaxis: enoxaparin (LOVENOX) injection 40 mg Start: 05/03/21 1800 ?  Code Status: Full Code  ?Family Communication:  None present at bedside.  Plan of care discussed with patient in length and he/she verbalized understanding and agreed with it. ? ?Status is: Inpatient ?Remains inpatient appropriate because: Needs further work-up driven by oncology. ? ? ?Estimated body mass index is 26 kg/m? as calculated from the following: ?   Height as of this encounter: 5\' 7"  (1.702 m). ?  Weight as of this encounter: 75.3 kg. ? ?  ?Nutritional Assessment: ?Body mass index is 26 kg/m?Marland KitchenMarland Kitchen ?Seen by dietician.  I agree with the assessment and plan as outlined below: ?Nutrition Status: ?  ?  ?  ? ?. ?Skin Assessment: ?I have examined the patient's skin and I agree with the wound assessment as performed by the wound care RN as outlined below: ?  ? ?Consultants:  ?Oncology ? ?Procedures:  ?As above ? ?Antimicrobials:  ?Anti-infectives (From admission, onward)  ? ? None  ? ?  ?  ? ? ?Subjective: ? ?Seen and examined.  He complains of back pain which is stable but intermittently gets worse.  No other complaint.  He is also complaining of itching in the back. ? ?Objective: ?Vitals:  ? 05/02/21 2117 05/02/21 2255 05/03/21 0529 05/03/21 1050  ?BP: (!) 89/64 (!) 96/54 97/76 (!) 88/57  ?Pulse: 92  88   ?Resp:   17   ?Temp: 98.2 ?F (36.8 ?C)  97.9 ?F (36.6 ?C)   ?TempSrc: Oral  Oral   ?SpO2: 97%  97%   ?Weight:      ?Height:      ? ? ?Intake/Output Summary (Last 24 hours) at 05/03/2021 1146 ?Last data filed at 05/03/2021 0300 ?Gross per 24 hour  ?Intake 710 ml  ?Output 200 ml  ?Net 510 ml  ? ? ?Filed Weights  ? 04/28/21 0354  ?Weight: 75.3 kg  ? ? ?Examination: ? ?General exam: Appears calm and comfortable  ?Respiratory system: Clear to auscultation. Respiratory effort normal. ?Cardiovascular system: S1 & S2 heard, RRR. No JVD, murmurs, rubs, gallops or clicks. No pedal edema. ?Gastrointestinal system: Abdomen is nondistended, soft and nontender. No organomegaly or masses felt. Normal bowel sounds heard. ?Central nervous system: Alert and oriented. No focal neurological deficits. ?Extremities: Symmetric 5 x 5 power. ?Skin: Skin scratches. ?Psychiatry: Judgement and insight appear normal. Mood & affect appropriate.  ? ? ?Data Reviewed: I have personally reviewed following labs and imaging studies ? ?CBC: ?Recent Labs  ?Lab 04/28/21 ?0626 04/28/21 ?9485 04/29/21 ?0114  ?WBC  10.4 11.0* 7.4  ?NEUTROABS 8.9*  --   --   ?HGB 12.4* 11.8* 11.2*  ?HCT 36.9* 33.8* 32.4*  ?MCV 88.9 86.2 89.8  ?PLT 371 367 315  ? ? ?Basic Metabolic Panel: ?Recent Labs  ?Lab 04/28/21 ?0742 04/28/21 ?0816 04/28/21 ?1318 04/28/21 ?2007 04/29/21 ?0114 04/29/21 ?0654 04/29/21 ?1155 04/29/21 ?2026 04/30/21 ?0602 04/30/21 ?1148 04/30/21 ?1952 05/01/21 ?4627  ?NA 123*  --  125* 123* 122* 122*   < > 126* 124* 125* 124* 124*  ?K 4.1  --  4.5 4.0 4.3 3.9  --   --  3.9  --   --  3.9  ?CL 90*  --  90* 90* 91* 90*  --   --  89*  --   --  91*  ?CO2 22  --  23 23 23 23   --   --  24  --   --  24  ?GLUCOSE 114*  --  108* 112* 105* 104*  --   --  105*  --   --  109*  ?BUN 9  --  10 9 11 13   --   --  20  --   --  22  ?CREATININE 0.79  --  0.78 0.82 0.99 0.83  --   --  1.01  --   --  0.94  ?CALCIUM 9.7  --  9.5 9.7 8.9 9.1  --   --  9.6  --   --  9.6  ?MG  --  1.9  --   --   --   --   --   --   --   --   --   --   ?PHOS 4.0  --  3.8 3.5 3.8 3.6  --   --   --   --   --   --   ? < > = values in this interval not displayed.  ? ? ?GFR: ?Estimated Creatinine Clearance: 71.3 mL/min (by C-G formula based on SCr of 0.94 mg/dL). ?Liver Function Tests: ?Recent Labs  ?Lab 04/28/21 ?7371 04/28/21 ?0626 04/28/21 ?1318 04/28/21 ?2007 04/29/21 ?0114 04/29/21 ?0654 05/01/21 ?9485  ?AST 117*  --   --   --   --   --  42*  ?ALT 74*  --   --   --   --   --  38  ?ALKPHOS 72  --   --   --   --   --  67  ?BILITOT 1.2  --   --   --   --   --  1.0  ?PROT 9.5*  --   --   --   --   --  7.8  ?ALBUMIN 4.7   < > 4.0 4.0 3.7 3.8 3.7  ? < > = values in this interval not displayed.  ? ? ?No results for input(s): LIPASE, AMYLASE in the last 168 hours. ?No results for input(s): AMMONIA in the last 168 hours. ?Coagulation Profile: ?Recent Labs  ?Lab 05/03/21 ?0606  ?INR 1.1  ? ?Cardiac Enzymes: ?No results for input(s): CKTOTAL, CKMB, CKMBINDEX, TROPONINI in the last 168 hours. ?BNP (last 3 results) ?No results for input(s): PROBNP in the last 8760 hours. ?HbA1C: ?No  results for input(s): HGBA1C in the last 72 hours. ?CBG: ?No results for input(s): GLUCAP in the last 168 hours. ?Lipid Profile: ?No results for input(s): CHOL, HDL, LDLCALC, TRIG, CHOLHDL, LDLDIRECT in t

## 2021-05-03 NOTE — Care Management Important Message (Signed)
Important Message ? ?Patient Details IM Letter placed in Patients room. ?Name: Terry Page. ?MRN: [id number] ?Date of Birth: 25-Jun-1955 ? ? ?Medicare Important Message Given:  Yes ? ? ? ? ?Kerin Salen ?05/03/2021, 12:26 PM ?

## 2021-05-03 NOTE — Procedures (Signed)
Vascular and Interventional Radiology Procedure Note ? ?Patient: Terry Page. ?DOB: 11-19-1955 ?Medical Record Number: [id number] ?Note Date/Time: 05/03/21 3:10 PM  ? ?Performing Physician: Michaelle Birks, MD ?Assistant(s): None ? ?Diagnosis: Hx lung CA. Iliac bone lytic lesions on imaging. ? ?Procedure: RIGHT ILIAC LYTIC LESION ASPIRATION and BIOPSY ? ?Anesthesia: Conscious Sedation ?Complications: None ?Estimated Blood Loss: Minimal ?Specimens: Sent for Cytology and Pathology ? ?Findings:  ?Successful CT-guided aspiration and biopsy of R iliac lytic lesion. ?A total of 2 samples were obtained. ?Hemostasis of the tract was achieved using Manual Pressure. ? ?Plan: Bed rest for 1 hours. ? ?See detailed procedure note with images in PACS. ?The patient tolerated the procedure well without incident or complication and was returned to Floor Bed in stable condition.  ? ? ?Michaelle Birks, MD ?[facility] ?[facility] ? ? ?Pager. [phone] ?Clinic. [phone]  ?

## 2021-05-03 NOTE — Progress Notes (Signed)
?   05/03/21 1306  ?Assess: MEWS Score  ?Temp 98 ?F (36.7 ?C)  ?BP (!) 87/62  ?Pulse Rate (!) 106  ?Resp 17  ?Level of Consciousness Alert  ?SpO2 97 %  ?O2 Device Room Air  ?Assess: MEWS Score  ?MEWS Temp 0  ?MEWS Systolic 1  ?MEWS Pulse 1  ?MEWS RR 0  ?MEWS LOC 0  ?MEWS Score 2  ?MEWS Score Color Yellow  ?Assess: if the MEWS score is Yellow or Red  ?Were vital signs taken at a resting state? Yes  ?Focused Assessment No change from prior assessment  ?Does the patient meet 2 or more of the SIRS criteria? No  ?MEWS guidelines implemented *See Row Information* Yes  ?Treat  ?Pain Scale 0-10  ?Pain Score 4  ?Pain Intervention(s) Repositioned  ?Take Vital Signs  ?Increase Vital Sign Frequency  Yellow: Q 2hr X 2 then Q 4hr X 2, if remains yellow, continue Q 4hrs  ?Escalate  ?MEWS: Escalate Yellow: discuss with charge nurse/RN and consider discussing with provider and RRT  ?Notify: Charge Nurse/RN  ?Name of Charge Nurse/RN Notified [place] RN  ?Date Charge Nurse/RN Notified 05/03/21  ?Time Charge Nurse/RN Notified 1320  ?Notify: Provider  ?Provider Name/Title Pahwani  ?Date Provider Notified 05/03/21  ?Time Provider Notified 1330  ?Notification Type  ?(secure chat)  ?Notification Reason Other (Comment) ?(SBP 80's)  ?Provider response No new orders  ?Date of Provider Response 05/03/21  ?Document  ?Patient Outcome Other (Comment) ?(Stable and remains on unit)  ?Progress note created (see row info) Yes  ?Assess: SIRS CRITERIA  ?SIRS Temperature  0  ?SIRS Pulse 1  ?SIRS Respirations  0  ?SIRS WBC 0  ?SIRS Score Sum  1  ? ? ?

## 2021-05-03 NOTE — Consult Note (Signed)
WOC Nurse Consult Note: ?Patient receiving care in [place]. ?Reason for Consult: Skin tears to BLE and buttocks/sacrum ?Wound type: These areas are NOT skin tears, nor are they pressure injuries. The patient has a wooden "scratcher" in the bed with him and has been using it to scratch the areas that itch. Dr. Doristine Bosworth in room at time of my assessment and agrees to order Benadryl. ?Pressure Injury POA: Yes/No/NA ?Measurement: ?Wound bed: all are scattered, open, pink ?Drainage (amount, consistency, odor) none ?Periwound: intact ?Dressing procedure/placement/frequency: ?Apply [employer] Clear (purple and white tube in clean utility) to the scratch marks on the BLEs. Use EITHER a foam dressing or [employer] Clear on the sacrum but NOT both. ?I have also ordered a standard size bed with air mattress. ?Thank you for the consult.  Discussed plan of care with the patient and bedside nurse.  [place] nurse will not follow at this time.  Please re-consult the [place] team if needed. ? ?Val Riles, RN, MSN, CWOCN, CNS-BC, pager [phone]  ?  ?

## 2021-05-04 DIAGNOSIS — J439 Emphysema, unspecified: Secondary | ICD-10-CM

## 2021-05-04 DIAGNOSIS — F1090 Alcohol use, unspecified, uncomplicated: Secondary | ICD-10-CM | POA: Diagnosis not present

## 2021-05-04 DIAGNOSIS — F1010 Alcohol abuse, uncomplicated: Secondary | ICD-10-CM | POA: Diagnosis not present

## 2021-05-04 DIAGNOSIS — E875 Hyperkalemia: Secondary | ICD-10-CM | POA: Diagnosis not present

## 2021-05-04 LAB — BASIC METABOLIC PANEL
Anion gap: 7 (ref 5–15)
BUN: 29 mg/dL — ABNORMAL HIGH (ref 8–23)
CO2: 20 mmol/L — ABNORMAL LOW (ref 22–32)
Calcium: 8.3 mg/dL — ABNORMAL LOW (ref 8.9–10.3)
Chloride: 100 mmol/L (ref 98–111)
Creatinine, Ser: 1.08 mg/dL (ref 0.61–1.24)
GFR, Estimated: >60 mL/min (ref >60)
Glucose, Bld: 110 mg/dL — ABNORMAL HIGH (ref 70–99)
Potassium: 3.9 mmol/L (ref 3.5–5.1)
Sodium: 127 mmol/L — ABNORMAL LOW (ref 135–145)

## 2021-05-04 MED ORDER — FENTANYL 25 MCG/HR TD PT72
1.0000 | MEDICATED_PATCH | TRANSDERMAL | Status: DC
  Administered 2021-05-04 – 2021-05-06 (×2): 1 via TRANSDERMAL
  Filled 2021-05-04 (×2): qty 1

## 2021-05-04 NOTE — Progress Notes (Signed)
Physical Therapy Treatment ?Patient Details ?Name: Terry Page. ?MRN: [id number] ?DOB: 30-Jul-1955 ?Today's Date: 05/04/2021 ? ? ?History of Present Illness Terry Page. is a 66 y.o. male with medical history significant of bullous emphysema, HTN, prostate CA, EtOH abuse, LUL Lung CA. Presenting 04/27/21  with falls..  Patient found with hyperkalemia and hyponatremia( baseline hyponatremic).CT lumbar: . Lytic lesions with associated soft tissue mass in the right iliac bone and left sacrum extending to the SI joint concerning for metastatic disease or myeloma. No acute fracture is seen ? ?  ?PT Comments  ? ? Pt supine in bed and agreeable to work with therapy, appears to be in better spirits and was joking with staff. Pt required mod assist to elevate trunk, min assist for transfers, and min guard for ambulation with RW, +2 for recliner follow for safety, pt took two rest breaks during the overall 50ft ambulation. Pt exhibits functional ROM and grossly 4/5 extremity strength and is able to complete functional tasks. Pt worked with OT standing at sink for ADLS for 92min with min guard without overt LOB.  Pt exhibited BUE tremors that were not present on Monday 5/1, pt reported they were new this admission. Current discharge destination remains appropriate. We will continue to follow.  ?  ?Recommendations for follow up therapy are one component of a multi-disciplinary discharge planning process, led by the attending physician.  Recommendations may be updated based on patient status, additional functional criteria and insurance authorization. ? ?Follow Up Recommendations ? Skilled nursing-short term rehab (<3 hours/day) ?  ?  ?Assistance Recommended at Discharge Frequent or constant Supervision/Assistance  ?Patient can return home with the following A little help with walking and/or transfers;A little help with bathing/dressing/bathroom;Help with stairs or ramp for entrance;Assistance with  cooking/housework;Assist for transportation ?  ?Equipment Recommendations ? None recommended by PT  ?  ?Recommendations for Other Services   ? ? ?  ?Precautions / Restrictions Precautions ?Precautions: Fall ?Restrictions ?Weight Bearing Restrictions: No  ?  ? ?Mobility ? Bed Mobility ?Overal bed mobility: Needs Assistance ?Bed Mobility: Supine to Sit ?  ?  ?Supine to sit: Mod assist ?  ?  ?General bed mobility comments: Pt mod assist for lift assist of trunk, required seated rest break to catch breath after sitting fully upright. ?  ? ?Transfers ?Overall transfer level: Needs assistance ?Equipment used: Rolling walker (2 wheels) ?Transfers: Sit to/from Stand ?Sit to Stand: Min assist ?  ?  ?  ?  ?  ?General transfer comment: Pt min assist for steadying of RW and lift assist, VCs for powerup. ?  ? ?Ambulation/Gait ?Ambulation/Gait assistance: Min guard, +2 safety/equipment ?Gait Distance (Feet): 80 Feet ?Assistive device: Rolling walker (2 wheels) ?Gait Pattern/deviations: Step-through pattern, Antalgic, Trunk flexed ?Gait velocity: decr ?  ?  ?General Gait Details: Pt ambulated with RW and min guard, no physical assist provided, +2 for recliner follow for safety. Pt's gait seems to have improved in speed, moderate VCs for proximity to device. No overt LOB. pt took one short, ~15s standing rest break and one longer seated rest break. ? ? ?Stairs ?  ?  ?  ?  ?  ? ? ?Wheelchair Mobility ?  ? ?Modified Rankin (Stroke Patients Only) ?  ? ? ?  ?Balance Overall balance assessment: Needs assistance ?Sitting-balance support: No upper extremity supported, Feet supported ?Sitting balance-Leahy Scale: Fair ?  ?  ?Standing balance support: During functional activity, Bilateral upper extremity supported ?Standing balance-Leahy Scale: Fair ?Standing  balance comment: During static stance pt can stand and use BUEs for ADLs, though may have been supporting self via pelvis against sink. Pt stood for ~6min working on ADLs with OT. ?  ?   ?  ?  ?  ?  ?  ?  ?  ?  ?  ?  ? ?  ?Cognition Arousal/Alertness: Awake/alert ?Behavior During Therapy: [facility] for tasks assessed/performed ?Overall Cognitive Status: Within Functional Limits for tasks assessed ?Area of Impairment: Safety/judgement, Awareness ?  ?  ?  ?  ?  ?  ?  ?  ?  ?  ?  ?  ?Safety/Judgement: Decreased awareness of deficits ?Awareness: Emergent ?  ?General Comments: Pt is joking around today, seems to be in better spirits ?  ?  ? ?  ?Exercises   ? ?  ?General Comments   ?  ?  ? ?Pertinent Vitals/Pain Pain Assessment ?Pain Assessment: 0-10 ?Pain Score: 7  ?Pain Location: low back ?Pain Descriptors / Indicators: Discomfort, Grimacing ?Pain Intervention(s): Monitored during session, Repositioned  ? ? ?Home Living Family/patient expects to be discharged to:: Private residence ?Living Arrangements: Alone ?Available Help at Discharge: Available PRN/intermittently;Family ?Type of Home: Apartment ?Home Access: Level entry;Stairs to enter ?  ?Entrance Stairs-Number of Steps: 2 steps to front door ?  ?Home Layout: One level ?Home Equipment: Rollator (4 wheels);Grab bars - tub/shower;Shower seat ?   ?  ?Prior Function    ?  ?  ?   ? ?PT Goals (current goals can now be found in the care plan section) Acute Rehab PT Goals ?Patient Stated Goal: agreed to ambulate ?PT Goal Formulation: With patient ?Time For Goal Achievement: 05/13/21 ?Potential to Achieve Goals: Fair ?Progress towards PT goals: Progressing toward goals ? ?  ?Frequency ? ? ? Min 2X/week ? ? ? ?  ?PT Plan Current plan remains appropriate  ? ? ?Co-evaluation PT/OT/SLP Co-Evaluation/Treatment: Yes ?Reason for Co-Treatment: To address functional/ADL transfers ?PT goals addressed during session: Mobility/safety with mobility;Proper use of DME ?OT goals addressed during session: ADL's and self-care ?  ? ?  ?AM-PAC PT "6 Clicks" Mobility   ?Outcome Measure ? Help needed turning from your back to your side while in a flat bed without using bedrails?: A  Little ?Help needed moving from lying on your back to sitting on the side of a flat bed without using bedrails?: A Little ?Help needed moving to and from a bed to a chair (including a wheelchair)?: A Little ?Help needed standing up from a chair using your arms (e.g., wheelchair or bedside chair)?: A Little ?Help needed to walk in hospital room?: A Lot ?Help needed climbing 3-5 steps with a railing? : Total ?6 Click Score: 15 ? ?  ?End of Session Equipment Utilized During Treatment: Gait belt ?Activity Tolerance: Patient tolerated treatment well;No increased pain ?Patient left: in chair;with call bell/phone within reach;with chair alarm set ?Nurse Communication: Mobility status ?PT Visit Diagnosis: Unsteadiness on feet (R26.81);Repeated falls (R29.6) ?  ? ? ?Time: 1105-1130 ?PT Time Calculation (min) (ACUTE ONLY): 25 min ? ?Charges:  $Gait Training: 8-22 mins          ?          ?Coolidge Breeze, PT, DPT ?[facility] Department ?Office: [phone] ?Pager: [phone] ? ? ?Coolidge Breeze ?05/04/2021, 1:25 PM ? ?

## 2021-05-04 NOTE — [place] FL2 (Addendum)
?[place] MEDICAID FL2 LEVEL OF CARE SCREENING TOOL  ?  ? ?IDENTIFICATION  ?Patient Name: ?Terry Page. Birthdate: 1955/11/06 Sex: male Admission Date (Current Location): ?04/27/2021  ?[place] and [place] Number: ? [place] ?  Facility and Address:  ?[facility],  [address], [place] ?     Provider Number: ?[id number]  ?Attending Physician Name and Address:  ?Mendel Corning, MD ? Relative Name and Phone Number:  ?  ?   ?Current Level of Care: ?Hospital Recommended Level of Care: ?[place] Prior Approval Number: ?  ? ?Date Approved/Denied: ?  PASRR Number: ?[id number] A ? ?Discharge Plan: ?SNF ?  ? ?Current Diagnoses: ?Patient Active Problem List  ? Diagnosis Date Noted  ? Hyperkalemia 04/28/2021  ? Elevated LFTs 04/28/2021  ? Falls 04/28/2021  ? Chronic back pain 04/28/2021  ? Hemoptysis 05/18/2020  ? Protein-calorie malnutrition, severe 05/07/2020  ? Abdominal pain   ? Necrotizing pneumonia ([place]) 05/03/2020  ? Bullous emphysema ([place])   ? Acute respiratory failure with hypoxia ([place]) 04/12/2020  ? Pneumonia due to COVID-19 virus 04/10/2020  ? Primary cancer of left upper lobe of lung ([place]) 04/10/2020  ? Cavitary pneumonia 04/10/2020  ? SIADH (syndrome of inappropriate ADH production) ([place]) 04/10/2020  ? Nicotine dependence 04/10/2020  ? Essential hypertension 04/10/2020  ? HLD (hyperlipidemia) 04/10/2020  ? Sepsis ([place]) 04/10/2020  ? Recurrent spontaneous pneumothorax 06/01/2019  ? Malignant neoplasm of prostate ([place]) 05/20/2019  ? Hyponatremia 12/04/2017  ? AKI (acute kidney injury) ([place]) 12/04/2017  ? Incidental lung nodule, greater than or equal to 87mm 10/22/2017  ? COPD with acute exacerbation ([place])   ? Spontaneous pneumothorax 10/20/2017  ? Tobacco abuse   ? Alcohol abuse   ? ? ?Orientation RESPIRATION BLADDER Height & Weight   ?  ?Self, Time, Situation, Place ? Normal Incontinent Weight: 75.3 kg ?Height:  5\' 7"  (170.2 cm)  ?BEHAVIORAL SYMPTOMS/MOOD NEUROLOGICAL  BOWEL NUTRITION STATUS  ?    Continent Diet (Heart Healthy)  ?AMBULATORY STATUS COMMUNICATION OF NEEDS Skin   ?Extensive Assist Verbally Skin abrasions (Multiple skin tears from scratching. Buttocks sacrum and legs) ?  ?  ?  ?    ?     ?     ? ? ?Personal Care Assistance Level of Assistance  ?Bathing, Feeding, Dressing Bathing Assistance: Limited assistance ?Feeding assistance: Limited assistance ?Dressing Assistance: Limited assistance ?   ? ?Functional Limitations Info  ?Sight, Hearing, Speech Sight Info: Impaired ?Hearing Info: Adequate ?Speech Info: Adequate  ? ? ?SPECIAL CARE FACTORS FREQUENCY  ?PT (By licensed PT), OT (By licensed OT)   ?  ?PT Frequency: 5 x weekly ?OT Frequency: 5 x weekly ?  ?  ?  ?   ? ? ?Contractures Contractures Info: Not present  ? ? ?Additional Factors Info  ?Code Status, Allergies Code Status Info: Full ?Allergies Info: None known ?  ?  ?  ?   ? ?Current Medications (05/04/2021):  This is the current hospital active medication list ?Current Facility-Administered Medications  ?Medication Dose Route Frequency Provider Last Rate Last Admin  ? 0.9 %  sodium chloride infusion   Intravenous Continuous Darliss Cheney, MD 125 mL/hr at 05/03/21 2350 Infusion Verify at 05/03/21 2350  ? camphor-menthol ([name]) lotion   Topical PRN Elder Cyphers, NP   Given at 05/04/21 1002  ? diphenhydrAMINE (BENADRYL) injection 12.5 mg  12.5 mg Intravenous Q6H PRN Darliss Cheney, MD   12.5 mg at 05/04/21 1040  ? enoxaparin (  LOVENOX) injection 40 mg  40 mg Subcutaneous Q24H Allred, Darrell K, PA-C   40 mg at 05/03/21 1702  ? fentaNYL (DURAGESIC) 12 MCG/HR 1 patch  1 patch Transdermal Q72H Volanda Napoleon, MD   1 patch at 05/04/21 [phone]  ? folic acid (FOLVITE) tablet 1 mg  1 mg Oral Daily Kyle, Tyrone A, DO   1 mg at 05/04/21 7356  ? ipratropium-albuterol (DUONEB) 0.5-2.5 (3) MG/3ML nebulizer solution 3 mL  3 mL Nebulization Q6H PRN Marylyn Ishihara, Tyrone A, DO      ? lidocaine (LIDODERM) 5 % 1 patch  1 patch Transdermal  Q24H Kyle, Tyrone A, DO   1 patch at 05/02/21 2129  ? morphine (PF) 2 MG/ML injection 2 mg  2 mg Intravenous Q4H PRN Marylyn Ishihara, Tyrone A, DO   2 mg at 05/03/21 0703  ? multivitamin with minerals tablet 1 tablet  1 tablet Oral Daily Kyle, Tyrone A, DO   1 tablet at 05/04/21 7014  ? ondansetron (ZOFRAN) tablet 4 mg  4 mg Oral Q6H PRN Cherylann Ratel A, DO      ? Or  ? ondansetron (ZOFRAN) injection 4 mg  4 mg Intravenous Q6H PRN Marylyn Ishihara, Tyrone A, DO      ? oxyCODONE (Oxy IR/ROXICODONE) immediate release tablet 5 mg  5 mg Oral Q4H PRN Marylyn Ishihara, Tyrone A, DO   5 mg at 05/04/21 1041  ? thiamine tablet 100 mg  100 mg Oral Daily Kyle, Tyrone A, DO   100 mg at 05/04/21 1030  ? Or  ? thiamine (B-1) injection 100 mg  100 mg Intravenous Daily Marylyn Ishihara, Tyrone A, DO      ? ? ? ?Discharge Medications: ?Please see discharge summary for a list of discharge medications. ? ?Relevant Imaging Results: ? ?Relevant Lab Results: ? ? ?Additional Information ?SSN [id number] ? ?Yaniyah Koors, Marjie Skiff, RN ? ? ? ? ?

## 2021-05-04 NOTE — TOC Progression Note (Signed)
Transition of Care (TOC) - Progression Note  ? ? ?Patient Details  ?Name: Keisean Skowron. ?MRN: [id number] ?Date of Birth: Oct 19, 1955 ? ?Transition of Care (TOC) CM/SW Contact  ?Lynise Porr, Marjie Skiff, RN ?Phone Number: ?05/04/2021, 1:23 PM ? ?Clinical Narrative:    ?Noted that pt declined to work with PT/OT the last couple of days. Spoke with pt at length at bedside today to share importance of working with PT/OT and to let him know that if he wanted to go to SNF we need it noted that he is making efforts with therapy. Pt states that he will work with PT/OT today. He states that he really wants to go home but will allow me to fax him out to area SNFs to see what beds are available. FL2 faxed out. [facility] received: [phone] A. TOC will provide SNF bed offers once available. ? ? ?Expected Discharge Plan: [place] ?Barriers to Discharge: Continued Medical Work up ? ?Expected Discharge Plan and Services ?Expected Discharge Plan: [place] ?  ?Discharge Planning Services: CM Consult ?Post Acute Care Choice: [place] ?Living arrangements for the past 2 months: Apartment ?                ?  ?  ?  ?  ?  ?  ?  ?  ?  ?  ? ? ?Social Determinants of Health (SDOH) Interventions ?  ? ?Readmission Risk Interventions ? ?  04/29/2021  ?  2:00 PM  ?Readmission Risk Prevention Plan  ?Transportation Screening Complete  ?PCP or Specialist Appt within 5-7 Days Complete  ?Home Care Screening Complete  ?Medication Review (RN CM) Complete  ? ? ?

## 2021-05-04 NOTE — Progress Notes (Signed)
?      ? ? ? [facility] ?                                                                           ? ? ?Terry Page, is a 66 y.o. male, DOB - 09/30/1955, EVO:[id number] ?Admit date - 04/27/2021    ?Outpatient Primary MD for the patient is [name], MD ? ?LOS - 6  days ? ?Chief Complaint  ?Patient presents with  ? Back Pain  ?    ? ?Brief summary  ? ?Terry Koman. is a 66 y.o. male with medical history significant of bullous emphysema, HTN, prostate CA, EtOH abuse, LUL Lung CA. Presented with falls. He reports that he's had falls for the past 2 days. He falls secondary to pain in his lower back. He says the back pain is chronic but seems to be worsening.  He was hemodynamically stable upon arrival and was found to have right lesions with associated soft tissue mass in the right iliac bone and left sacrum extending to the SI joint concerning for metastatic disease or myeloma ?Oncology following, underwent biopsy on 5/2 ? ?Assessment & Plan  ? ?Right iliac lytic lesion, left sacrum ?-History of prior prostate CA, PSA however normal. ?-In the past has received SBRT to left lung for presumed lung CA ?-Oncology consulted.  CT chest abdomen pelvis showed multifocal lytic bone metastasis, lesion in the spine of left scapula, bilateral iliac bones and left sacral wing but no other malignancy ?-CEA 77.1. ?-Underwent right iliac lytic lesion aspiration and biopsy on 5/2, results pending ?-Pain not well controlled, 8/10, increase fentanyl patch to 25 mcg/hour, continue oxycodone as needed for moderate/breakthrough pain.  Placed on bowel regimen. ? ?Frequent falls ?-PT evaluation completed, recommended SNF ?  ?Acute on chronic hyponatremia ?-Urine osmolality 226, UNA 94.  Serum osmolality 261 ?-Has prior lab work indicated above SIADH ?-Sodium trending up, 127, follow closely ?  ?Mildly elevated AST ?-Ultrasound liver showed fatty liver, negative for any lesion ?  ?History of alcohol use ?-Drinks 3-4 beers  per day, last drink 2 days prior to admission ?-Currently no withdrawal symptoms ?  ?Essential hypertension ?-BP currently soft, antihypertensives on hold ? ? ?Left lung nodule: No prior biopsy of the left nodule but he received SBRT due to lung nodule completed in April 2022.  ?- Defer to oncology. ?  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 ?--Status post radiation to the prostate completed 10/07/2019. ?  ? ?Code Status: Full CODE STATUS ?DVT Prophylaxis:  enoxaparin (LOVENOX) injection 40 mg Start: 05/03/21 1800 ? ? ?Level of Care: Level of care: Telemetry ?Family Communication: Updated patient ? ? ?Disposition Plan:      Remains inpatient appropriate: Biopsy pending, pain not well controlled ? ? ?Procedures:  ?Right iliac lytic lesion aspiration biopsy ? ?Consultants:   ?Oncology ?Interventional radiology ? ?Antimicrobials: None ? ? ?Medications ? enoxaparin (LOVENOX) injection  40 mg Subcutaneous Q24H  ? fentaNYL  1 patch Transdermal E99B  ? folic acid  1 mg Oral Daily  ? lidocaine  1 patch Transdermal Q24H  ? multivitamin with minerals  1 tablet Oral  Daily  ? thiamine  100 mg Oral Daily  ? Or  ? thiamine  100 mg Intravenous Daily  ? ? ? ? ?Subjective:  ? ?Terry Page was seen and examined today.  Pain not well controlled, 8/10.  No acute nausea or vomiting.  Patient denies dizziness, chest pain, shortness of breath, abdominal pain, N/V/D/C, new weakness, numbess, tingling. No acute events overnight.   ? ?Objective:  ? ?Vitals:  ? 05/03/21 1430 05/03/21 1500 05/03/21 1600 05/03/21 2159  ?BP: 121/76 99/80  124/81  ?Pulse: 92 98  98  ?Resp: 12 11 17 17   ?Temp:    98.8 ?F (37.1 ?C)  ?TempSrc:    Oral  ?SpO2: 98% 100%  93%  ?Weight:      ?Height:      ? ? ?Intake/Output Summary (Last 24 hours) at 05/04/2021 1538 ?Last data filed at 05/03/2021 2350 ?Gross per 24 hour  ?Intake 1012.23 ml  ?Output 250 ml  ?Net 762.23 ml  ? ? ? ?Wt Readings from Last 3  Encounters:  ?04/28/21 75.3 kg  ?10/11/20 76.7 kg  ?06/25/20 68.9 kg  ? ? ? ?Exam ?General: Alert and oriented x 3, NAD ?Cardiovascular: S1 S2 auscultated,  RRR ?Respiratory: Clear to auscultation bilaterally, no wheezing ?Gastrointestinal: Soft, nontender, nondistended, + bowel sounds ?Ext: no pedal edema bilaterally ?Neuro: Strength 5/5 upper and lower extremities bilaterally ?Psych: Normal affect and demeanor, alert and oriented x3  ? ? ? ?Data Reviewed:  I have personally reviewed following labs  ? ? ?CBC ?Lab Results  ?Component Value Date  ? WBC 7.4 04/29/2021  ? RBC 3.61 (L) 04/29/2021  ? HGB 11.2 (L) 04/29/2021  ? HCT 32.4 (L) 04/29/2021  ? MCV 89.8 04/29/2021  ? MCH 31.0 04/29/2021  ? PLT 315 04/29/2021  ? MCHC 34.6 04/29/2021  ? RDW 14.2 04/29/2021  ? LYMPHSABS 0.8 04/28/2021  ? MONOABS 0.5 04/28/2021  ? EOSABS 0.1 04/28/2021  ? BASOSABS 0.1 04/28/2021  ? ? ? ?Last metabolic panel ?Lab Results  ?Component Value Date  ? NA 127 (L) 05/04/2021  ? K 3.9 05/04/2021  ? CL 100 05/04/2021  ? CO2 20 (L) 05/04/2021  ? BUN 29 (H) 05/04/2021  ? CREATININE 1.08 05/04/2021  ? GLUCOSE 110 (H) 05/04/2021  ? GFRNONAA >60 05/04/2021  ? GFRAA >60 06/06/2019  ? CALCIUM 8.3 (L) 05/04/2021  ? PHOS 3.6 04/29/2021  ? PROT 7.8 05/01/2021  ? ALBUMIN 3.7 05/01/2021  ? LABGLOB 4.0 (H) 04/28/2021  ? BILITOT 1.0 05/01/2021  ? ALKPHOS 67 05/01/2021  ? AST 42 (H) 05/01/2021  ? ALT 38 05/01/2021  ? ANIONGAP 7 05/04/2021  ? ? ? ? ?Coagulation Profile: ?Recent Labs  ?Lab 05/03/21 ?0606  ?INR 1.1  ? ? ? ?Radiology Studies: I have personally reviewed the imaging studies  ?CT BIOPSY ? ?Result Date: 05/03/2021 ?INDICATION: History of lung cancer.  Iliac bone lytic lesions on imaging. EXAM: IMPRESSION: Successful CT-guided RIGHT iliac lytic lesion aspiration and biopsy, as above. Michaelle Birks, MD [facility] Radiology Electronically Signed   By: Michaelle Birks M.D.   On: 05/03/2021 17:55    ? ? ? ? ?Estill Cotta M.D. ?[facility] ?05/04/2021, 3:38 PM ? ?Available via Epic secure chat 7am-7pm ?After 7 pm, please refer to night coverage provider listed on amion. ? ? ? ?

## 2021-05-04 NOTE — Progress Notes (Signed)
Occupational Therapy Treatment ?Patient Details ?Name: Terry Page. ?MRN: [id number] ?DOB: March 13, 1955 ?Today's Date: 05/04/2021 ? ? ?History of present illness Terry Page. is a 66 y.o. male with medical history significant of bullous emphysema, HTN, prostate CA, EtOH abuse, LUL Lung CA. Presenting 04/27/21  with falls..  Patient found with hyperkalemia and hyponatremia( baseline hyponatremic).CT lumbar: . Lytic lesions with associated soft tissue mass in the right iliac bone and left sacrum extending to the SI joint concerning for metastatic disease or myeloma. No acute fracture is seen ?  ?OT comments ? Patient able to ambulate to sink with walker and stand at sink for grooming task x 8 minutes with min guard. No overt loss of balance. At times patient able to use both hand and maintain balance but then needing at least one UE support. Patient ambulated in hall with PT and a chair follow. Patient progressing well today.   ? ?Recommendations for follow up therapy are one component of a multi-disciplinary discharge planning process, led by the attending physician.  Recommendations may be updated based on patient status, additional functional criteria and insurance authorization. ?   ?Follow Up Recommendations ? Skilled nursing-short term rehab (<3 hours/day)  ?  ?Assistance Recommended at Discharge Frequent or constant Supervision/Assistance  ?Patient can return home with the following ? A lot of help with walking and/or transfers;A lot of help with bathing/dressing/bathroom;Assistance with cooking/housework;Direct supervision/assist for financial management;Assist for transportation;Help with stairs or ramp for entrance;Direct supervision/assist for medications management ?  ?Equipment Recommendations ? None recommended by OT  ?  ?Recommendations for Other Services   ? ?  ?Precautions / Restrictions Precautions ?Precautions: Fall ?Restrictions ?Weight Bearing Restrictions: No  ? ? ?  ? ?Mobility Bed  Mobility ?  ?  ?  ?  ?  ?  ?  ?  ?  ? ?Transfers ?  ?  ?  ?  ?  ?  ?  ?  ?  ?  ?  ?  ?Balance Overall balance assessment: Mild deficits observed, not formally tested ?Sitting-balance support: No upper extremity supported, Feet supported ?Sitting balance-Leahy Scale: Fair ?  ?  ?Standing balance support: During functional activity ?Standing balance-Leahy Scale: Fair ?Standing balance comment: can take hands off of walker at sink ?  ?  ?  ?  ?  ?  ?  ?  ?  ?  ?  ?   ? ?ADL either performed or assessed with clinical judgement  ? ?ADL Overall ADL's : Needs assistance/impaired ?  ?  ?Grooming: Min guard;Standing ?Grooming Details (indicate cue type and reason): [name] stood at sink x 8 minutes to perform grooming tasks with min guard. At times he could use bilateral hands and maintain balance but at times needing one hand to steady himself ?  ?  ?  ?  ?  ?  ?  ?  ?  ?  ?  ?  ?  ?  ?  ?General ADL Comments: Patient supervision to transferto side of bed using bed rail. Min guard to stand and take steps to sink. 8 minute standing at sink for grooming. ?  ? ?Extremity/Trunk Assessment Upper Extremity Assessment ?Upper Extremity Assessment: Overall WFL for tasks assessed ?  ?Lower Extremity Assessment ?Lower Extremity Assessment: Defer to PT evaluation ?  ?Cervical / Trunk Assessment ?Cervical / Trunk Assessment: Normal ?  ? ?Vision Patient Visual Report: No change from baseline ?  ?  ?Perception   ?  ?Praxis   ?  ? ?  Cognition Arousal/Alertness: Awake/alert ?Behavior During Therapy: [facility] for tasks assessed/performed ?Overall Cognitive Status: Within Functional Limits for tasks assessed ?  ?  ?  ?  ?  ?  ?  ?  ?  ?  ?  ?  ?  ?  ?  ?  ?General Comments: interesting sense of humor ?  ?  ?   ?Exercises   ? ?  ?Shoulder Instructions   ? ? ?  ?General Comments    ? ? ?Pertinent Vitals/ Pain       Pain Assessment ?Pain Assessment: No/denies pain ? ?Home Living   ?  ?  ?  ?  ?  ?  ?  ?  ?  ?  ?  ?  ?  ?  ?  ?  ?  ?  ? ?  ?Prior  Functioning/Environment    ?  ?  ?  ?   ? ?Frequency ? Min 2X/week  ? ? ? ? ?  ?Progress Toward Goals ? ?OT Goals(current goals can now be found in the care plan section) ? Progress towards OT goals: Progressing toward goals ? ?Acute Rehab OT Goals ?Patient Stated Goal: get stronger ?OT Goal Formulation: With patient ?Time For Goal Achievement: 05/15/21 ?Potential to Achieve Goals: Good  ?Plan Discharge plan remains appropriate   ? ?Co-evaluation ? ? ? PT/OT/SLP Co-Evaluation/Treatment: Yes ?Reason for Co-Treatment: To address functional/ADL transfers;Other (comment) ?  ?OT goals addressed during session: ADL's and self-care ?  ? ?  ?AM-PAC OT "6 Clicks" Daily Activity     ?Outcome Measure ? ? Help from another person eating meals?: A Little ?Help from another person taking care of personal grooming?: A Little ?Help from another person toileting, which includes using toliet, bedpan, or urinal?: A Lot ?Help from another person bathing (including washing, rinsing, drying)?: A Lot ?Help from another person to put on and taking off regular upper body clothing?: A Lot ?Help from another person to put on and taking off regular lower body clothing?: A Lot ?6 Click Score: 14 ? ?  ?End of Session Equipment Utilized During Treatment: Rolling walker (2 wheels) ? ?OT Visit Diagnosis: Unsteadiness on feet (R26.81);Muscle weakness (generalized) (M62.81);Pain;History of falling (Z91.81) ?  ?Activity Tolerance Patient tolerated treatment well ?  ?Patient Left in chair;with call bell/phone within reach (left in care of PT) ?  ?Nurse Communication Mobility status ?  ? ?   ? ?Time: 8889-1694 ?OT Time Calculation (min): 21 min ? ?Charges: OT General Charges ?$OT Visit: 1 Visit ?OT Treatments ?$Self Care/Home Management : 8-22 mins ? ?Dash Cardarelli, OTR/L ?Acute Care Rehab Services  ?Office [phone] ?Pager: [phone]  ? ?Yecenia Dalgleish L Wade Sigala ?05/04/2021, 12:55 PM ?

## 2021-05-05 ENCOUNTER — Inpatient Hospital Stay (HOSPITAL_COMMUNITY): Payer: Medicare HMO

## 2021-05-05 DIAGNOSIS — M5440 Lumbago with sciatica, unspecified side: Secondary | ICD-10-CM

## 2021-05-05 DIAGNOSIS — F1090 Alcohol use, unspecified, uncomplicated: Secondary | ICD-10-CM | POA: Diagnosis not present

## 2021-05-05 DIAGNOSIS — F1010 Alcohol abuse, uncomplicated: Secondary | ICD-10-CM | POA: Diagnosis not present

## 2021-05-05 DIAGNOSIS — G8929 Other chronic pain: Secondary | ICD-10-CM

## 2021-05-05 DIAGNOSIS — J439 Emphysema, unspecified: Secondary | ICD-10-CM | POA: Diagnosis not present

## 2021-05-05 DIAGNOSIS — E875 Hyperkalemia: Secondary | ICD-10-CM | POA: Diagnosis not present

## 2021-05-05 LAB — CREATININE, SERUM
Creatinine, Ser: 0.84 mg/dL (ref 0.61–1.24)
GFR, Estimated: >60 mL/min (ref >60)

## 2021-05-05 LAB — CYTOLOGY - NON PAP

## 2021-05-05 MED ORDER — GADOBUTROL 1 MMOL/ML IV SOLN
7.0000 mL | Freq: Once | INTRAVENOUS | Status: AC | PRN
  Administered 2021-05-05: 7 mL via INTRAVENOUS

## 2021-05-05 NOTE — TOC Progression Note (Addendum)
Transition of Care (TOC) - Progression Note  ? ? ?Patient Details  ?Name: Terry Page. ?MRN: [id number] ?Date of Birth: May 07, 1955 ? ?Transition of Care (TOC) CM/SW Contact  ?Kacy Conely, Marjie Skiff, RN ?Phone Number: ?05/05/2021, 10:09 AM ? ?Clinical Narrative:    ?Pt provided with SNF bed offers. Pt states now that he wants to go to a facility in [facility]. He had not specified [place] in previous conversations. Pt specifically asked for [facility]. Faxed FL2 to [facility] and left message with liaison at the SNF. TOC will continue to follow. ? ?1406 Addendum. Received call back from [facility]. They do have a bed available for pt tomorrow. Kim to start insurance auth for SNF with [employer]. Spoke with both pt at bedside and wife [name] via phone to alert of above information. ? ? ?Expected Discharge Plan: [place] ?Barriers to Discharge: Continued Medical Work up ? ?Expected Discharge Plan and Services ?Expected Discharge Plan: [place] ?  ?Discharge Planning Services: CM Consult ?Post Acute Care Choice: [place] ?Living arrangements for the past 2 months: Apartment ?                ?  ?Readmission Risk Interventions ? ?  04/29/2021  ?  2:00 PM  ?Readmission Risk Prevention Plan  ?Transportation Screening Complete  ?PCP or Specialist Appt within 5-7 Days Complete  ?Home Care Screening Complete  ?Medication Review (RN CM) Complete  ? ? ?

## 2021-05-05 NOTE — Progress Notes (Addendum)
?      ? ? ? [facility] ?                                                                           ? ? ?Terry Page, is a 66 y.o. male, DOB - 1955-10-14, QAS:[id number] ?Admit date - 04/27/2021    ?Outpatient Primary MD for the patient is [name], MD ? ?LOS - 7  days ? ?Chief Complaint  ?Patient presents with  ? Back Pain  ?    ? ?Brief summary  ? ?Terry Page. is a 66 y.o. male with medical history significant of bullous emphysema, HTN, prostate CA, EtOH abuse, LUL Lung CA. Presented with falls. He reports that he's had falls for the past 2 days. He falls secondary to pain in his lower back. He says the back pain is chronic but seems to be worsening.  He was hemodynamically stable upon arrival and was found to have right lesions with associated soft tissue mass in the right iliac bone and left sacrum extending to the SI joint concerning for metastatic disease or myeloma ?Oncology following, underwent biopsy on 5/2 ? ?Assessment & Plan  ? ?Right iliac lytic lesion, left sacrum ?-History of prior prostate CA, PSA however normal. ?-In the past has received SBRT to left lung for presumed lung CA ?-Oncology consulted.  CT chest abdomen pelvis showed multifocal lytic bone metastasis, lesion in the spine of left scapula, bilateral iliac bones and left sacral wing but no other malignancy ?-CEA 77.1. ?-Underwent right iliac lytic lesion aspiration and biopsy on 5/2, results pending ?-Continue pain control.  Biopsy results still pending.  ?-Requested oncology to follow-up with the patient for further plan of management prior to discharge. ? ?Frequent falls ?-PT evaluation completed, recommended SNF, awaiting bed availability ?  ?Acute on chronic hyponatremia ?-Urine osmolality 226, UNA 94.  Serum osmolality 261 ?-Has prior lab work indicated above SIADH ?-Sodium trending up, 127, follow bmet ?  ?Mildly elevated AST ?-Ultrasound liver showed fatty liver, negative for any lesion ?  ?History of alcohol  use ?-Drinks 3-4 beers per day, last drink 2 days prior to admission ?-Currently no withdrawal symptoms ?  ?Essential hypertension ?-BP currently soft, antihypertensives on hold ? ? ?Left lung nodule: No prior biopsy of the left nodule but he received SBRT due to presumed lung CA, completed in April 2022.  ?- Defer to oncology. ?  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 ?--Status post radiation to the prostate completed 10/07/2019. ? ?AKI: ? Resolved with IV fluid hydration.  ?  ? ?Code Status: Full CODE STATUS ?DVT Prophylaxis:  enoxaparin (LOVENOX) injection 40 mg Start: 05/03/21 1800 ? ? ?Level of Care: Level of care: Telemetry ?Family Communication: Updated patient ? ? ?Disposition Plan:      Remains inpatient appropriate: Awaiting biopsy results and oncology plan ? ?Procedures:  ?Right iliac lytic lesion aspiration biopsy ? ?Consultants:   ?Oncology ?Interventional radiology ? ?Antimicrobials: None ? ? ?Medications ? enoxaparin (LOVENOX) injection  40 mg Subcutaneous Q24H  ? fentaNYL  1 patch Transdermal N98X  ? folic acid  1 mg Oral Daily  ? lidocaine  1 patch Transdermal Q24H  ? multivitamin with minerals  1 tablet Oral Daily  ? thiamine  100 mg Oral Daily  ? Or  ? thiamine  100 mg Intravenous Daily  ? ? ? ? ?Subjective:  ? ?Reeve Turnley was seen and examined today.  Pain still not well controlled however appears to be better from yesterday.  No acute nausea vomiting.  Patient denies dizziness, chest pain, shortness of breath, abdominal pain, N/V. No acute events overnight.   ? ?Objective:  ? ?Vitals:  ? 05/04/21 1641 05/04/21 2141 05/05/21 0533 05/05/21 1497  ?BP: 108/88 104/82 (!) 158/106 (!) 129/95  ?Pulse: 89 89 100 95  ?Resp: 17 18 16    ?Temp: 98.3 ?F (36.8 ?C) 98.1 ?F (36.7 ?C) 98.1 ?F (36.7 ?C)   ?TempSrc: Oral Oral Oral   ?SpO2: 95% 97% 97%   ?Weight:      ?Height:      ? ? ?Intake/Output Summary (Last 24 hours) at 05/05/2021 1343 ?Last  data filed at 05/05/2021 0533 ?Gross per 24 hour  ?Intake 240 ml  ?Output 900 ml  ?Net -660 ml  ? ? ? ?Wt Readings from Last 3 Encounters:  ?04/28/21 75.3 kg  ?10/11/20 76.7 kg  ?06/25/20 68.9 kg  ? ?Physical Exam ?General: Alert and oriented x 3, NAD ?Cardiovascular: S1 S2 clear, RRR ?Respiratory: CTAB ?Gastrointestinal: Soft, nontender, nondistended, NBS ?Ext: no pedal edema bilaterally ?Neuro: no new deficits ? ? ? ?Data Reviewed:  I have personally reviewed following labs  ? ? ?CBC ?Lab Results  ?Component Value Date  ? WBC 7.4 04/29/2021  ? RBC 3.61 (L) 04/29/2021  ? HGB 11.2 (L) 04/29/2021  ? HCT 32.4 (L) 04/29/2021  ? MCV 89.8 04/29/2021  ? MCH 31.0 04/29/2021  ? PLT 315 04/29/2021  ? MCHC 34.6 04/29/2021  ? RDW 14.2 04/29/2021  ? LYMPHSABS 0.8 04/28/2021  ? MONOABS 0.5 04/28/2021  ? EOSABS 0.1 04/28/2021  ? BASOSABS 0.1 04/28/2021  ? ? ? ?Last metabolic panel ?Lab Results  ?Component Value Date  ? NA 127 (L) 05/04/2021  ? K 3.9 05/04/2021  ? CL 100 05/04/2021  ? CO2 20 (L) 05/04/2021  ? BUN 29 (H) 05/04/2021  ? CREATININE 0.84 05/05/2021  ? GLUCOSE 110 (H) 05/04/2021  ? GFRNONAA >60 05/05/2021  ? GFRAA >60 06/06/2019  ? CALCIUM 8.3 (L) 05/04/2021  ? PHOS 3.6 04/29/2021  ? PROT 7.8 05/01/2021  ? ALBUMIN 3.7 05/01/2021  ? LABGLOB 4.0 (H) 04/28/2021  ? BILITOT 1.0 05/01/2021  ? ALKPHOS 67 05/01/2021  ? AST 42 (H) 05/01/2021  ? ALT 38 05/01/2021  ? ANIONGAP 7 05/04/2021  ? ? ? ? ?Coagulation Profile: ?Recent Labs  ?Lab 05/03/21 ?0606  ?INR 1.1  ? ? ? ?Radiology Studies: I have personally reviewed the imaging studies  ?CT BIOPSY ? ?Result Date: 05/03/2021 ?INDICATION: History of lung cancer.  Iliac bone lytic lesions on imaging. EXAM: IMPRESSION: Successful CT-guided RIGHT iliac lytic lesion aspiration and biopsy, as above. Michaelle Birks, MD [facility] Radiology Electronically Signed   By: Michaelle Birks M.D.   On: 05/03/2021 17:55   ? ? ? ? ?Estill Cotta M.D. ?[facility] ?05/05/2021, 1:43 PM ? ?Available via Epic secure chat 7am-7pm ?After 7 pm, please refer to night coverage provider listed on amion. ? ? ? ?

## 2021-05-05 NOTE — Progress Notes (Signed)
Hematology/Oncology Progress Note ? ?Clinical Summary: Briefly Terry Page is a 67 year old male with medical history significant for presumed lung cancer status post SBRT and prostate cancer T1c adenocarcinoma Gleason score 3+4 status post curative radiation who presents with back pain and was found to have lytic lesions of the lumbar spine as well as soft tissue mass in the right iliac bone. ? ?Interval History: ?--pathology returned this AM, consistent with adenocarcinoma of the lung ?--CT scan shows bone lesions but no evidence of disease elsewhere in the body.  ?--patient currently awaiting placement.  ?--today he reports continued pain, currently managed with fentanyl patch and PRN oxycodone PO and morphine IV ?--denies any fevers, chills, sweats, nausea, or vomiting.  ? ?O: ? ?Vitals:  ? 05/05/21 0533 05/05/21 7801  ?BP: (!) 158/106 (!) 129/95  ?Pulse: 100 95  ?Resp: 16   ?Temp: 98.1 ?F (36.7 ?C)   ?SpO2: 97%   ? ? ?  Latest Ref Rng & Units 05/05/2021  ?  5:34 AM 05/04/2021  ?  5:07 AM 05/03/2021  ?  6:06 AM  ?CMP  ?Glucose 70 - 99 mg/dL  110   109    ?BUN 8 - 23 mg/dL  29   39    ?Creatinine 0.61 - 1.24 mg/dL 0.84   1.08   1.89    ?Sodium 135 - 145 mmol/L  127   123    ?Potassium 3.5 - 5.1 mmol/L  3.9   3.8    ?Chloride 98 - 111 mmol/L  100   92    ?CO2 22 - 32 mmol/L  20   22    ?Calcium 8.9 - 10.3 mg/dL  8.3   8.5    ? ? ?  Latest Ref Rng & Units 04/29/2021  ?  1:14 AM 04/28/2021  ?  7:42 AM 04/28/2021  ? 12:43 AM  ?CBC  ?WBC 4.0 - 10.5 K/uL 7.4   11.0   10.4    ?Hemoglobin 13.0 - 17.0 g/dL 11.2   11.8   12.4    ?Hematocrit 39.0 - 52.0 % 32.4   33.8   36.9    ?Platelets 150 - 400 K/uL 315   367   371    ?  ? ? ?GENERAL: chronically ill appearing elderly African American male in NAD  ?SKIN: skin color, texture, turgor are normal, no rashes or significant lesions ?EYES: conjunctiva are pink and non-injected, sclera clear ?LUNGS: clear to auscultation and percussion with normal breathing effort ?HEART: regular rate &  rhythm and no murmurs and no lower extremity edema ?Musculoskeletal: no cyanosis of digits and no clubbing  ?PSYCH: alert & oriented x 3, fluent speech ?NEURO: no focal motor/sensory deficits ? 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  ? ?#Metastatic Adenocarcinoma of the Lung ?# Bone Metastasis ?--we will order PD-L1 and NGS testing on tissue if feasible.  ?--will order MRI brain to complete staging. ?--once MRI brain is complete there is no barrier to d/c from our perspective ?--appreciate management per primary team. ?--will arrange for outpatient follow up once d/c. Treatment will depend on results of the above testing.  ? ?Ledell Peoples, MD ?Department of Hematology/Oncology ?[place] [facility] ?Phone: [phone] ?Pager: [phone] ?Email: [email or web address] ? ?

## 2021-05-06 ENCOUNTER — Encounter: Payer: Self-pay | Admitting: *Deleted

## 2021-05-06 DIAGNOSIS — J439 Emphysema, unspecified: Secondary | ICD-10-CM | POA: Diagnosis not present

## 2021-05-06 DIAGNOSIS — F1090 Alcohol use, unspecified, uncomplicated: Secondary | ICD-10-CM | POA: Diagnosis not present

## 2021-05-06 DIAGNOSIS — E875 Hyperkalemia: Secondary | ICD-10-CM | POA: Diagnosis not present

## 2021-05-06 DIAGNOSIS — F1010 Alcohol abuse, uncomplicated: Secondary | ICD-10-CM | POA: Diagnosis not present

## 2021-05-06 LAB — BASIC METABOLIC PANEL
Anion gap: 8 (ref 5–15)
BUN: 8 mg/dL (ref 8–23)
CO2: 21 mmol/L — ABNORMAL LOW (ref 22–32)
Calcium: 8.4 mg/dL — ABNORMAL LOW (ref 8.9–10.3)
Chloride: 99 mmol/L (ref 98–111)
Creatinine, Ser: 0.64 mg/dL (ref 0.61–1.24)
GFR, Estimated: >60 mL/min (ref >60)
Glucose, Bld: 109 mg/dL — ABNORMAL HIGH (ref 70–99)
Potassium: 3.8 mmol/L (ref 3.5–5.1)
Sodium: 128 mmol/L — ABNORMAL LOW (ref 135–145)

## 2021-05-06 NOTE — Progress Notes (Signed)
?      ? ? ? [facility] ?                                                                           ? ? ?Terry Page, is a 66 y.o. male, DOB - 1955-06-11, EUM:[id number] ?Admit date - 04/27/2021    ?Outpatient Primary MD for the patient is Terry Macadam, MD ? ?LOS - 8  days ? ?Chief Complaint  ?Patient presents with  ? Back Pain  ?    ? ?Brief summary  ? ?Charistopher Page. is a 66 y.o. male with medical history significant of bullous emphysema, HTN, prostate CA, EtOH abuse, LUL Lung CA. Presented with falls. He reports that he's had falls for the past 2 days. He falls secondary to pain in his lower back. He says the back pain is chronic but seems to be worsening.  He was hemodynamically stable upon arrival and was found to have right lesions with associated soft tissue mass in the right iliac bone and left sacrum extending to the SI joint concerning for metastatic disease or myeloma ?Oncology following, underwent biopsy on 5/2 ? ?Assessment & Plan  ? ?Right iliac lytic lesion, left sacrum ?-History of prior prostate CA, PSA however normal. ?-In the past has received SBRT to left lung for presumed lung CA ?-Oncology consulted.  CT chest abdomen pelvis showed multifocal lytic bone metastasis, lesion in the spine of left scapula, bilateral iliac bones and left sacral wing but no other malignancy ?-CEA 77.1. ?-Underwent right iliac lytic lesion aspiration and biopsy on 5/2, biopsy consistent with adenocarcinoma lung ?-Continue pain control, fentanyl patch 25 mcg/hr, oxycodone.  Patient states pain is still not well controlled, will increase the fentanyl patch to 50 mcg tomorrow. ? ?Frequent falls ?-PT evaluation completed, recommended SNF, awaiting bed availability ?  ?Acute on chronic hyponatremia ?-Urine osmolality 226, UNA 94.  Serum osmolality 261 ?-Has prior lab work indicated above SIADH ?-Sodium trending up, stable ?  ?Mildly elevated AST ?-Ultrasound liver showed fatty liver, negative for any lesion ?   ?History of alcohol use ?-Drinks 3-4 beers per day, last drink 2 days prior to admission ?-Currently no withdrawal symptoms ?  ?Essential hypertension ?-BP stable, antihypertensives on hold ? ? ?Left lung nodule: No prior biopsy of the left nodule but he received SBRT due to presumed lung CA, completed in April 2022.  ?- Defer to oncology. ?  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 ?--Status post radiation to the prostate completed 10/07/2019. ? ?AKI: ? Resolved with IV fluid hydration.  ?  ? ?Code Status: Full CODE STATUS ?DVT Prophylaxis:  enoxaparin (LOVENOX) injection 40 mg Start: 05/03/21 1800 ? ? ?Level of Care: Level of care: Telemetry ?Family Communication: Updated patient ? ? ?Disposition Plan:      Remains inpatient appropriate: Awaiting SNF authorization for skilled nursing facility ? ?Procedures:  ?Right iliac lytic lesion aspiration biopsy ? ?Consultants:   ?Oncology ?Interventional radiology ? ?Antimicrobials: None ? ? ?Medications ? enoxaparin (LOVENOX) injection  40 mg Subcutaneous Q24H  ? fentaNYL  1 patch Transdermal V40G  ? folic acid  1 mg Oral Daily  ?  lidocaine  1 patch Transdermal Q24H  ? multivitamin with minerals  1 tablet Oral Daily  ? thiamine  100 mg Oral Daily  ? Or  ? thiamine  100 mg Intravenous Daily  ? ? ? ? ?Subjective:  ? ?Kamarii Carton was seen and examined today.  No acute complaints except pain still not fully under control.  No acute nausea vomiting diarrhea, dizziness, chest pain.  No acute events overnight. ? ?Objective:  ? ?Vitals:  ? 05/05/21 1900 05/05/21 2047 05/06/21 0515 05/06/21 1357  ?BP: 123/89 (!) 139/97 (!) 152/97 106/89  ?Pulse: 98 71 95 (!) 104  ?Resp: 17 17 18 14   ?Temp: 98.5 ?F (36.9 ?C) 98.5 ?F (36.9 ?C) 98.4 ?F (36.9 ?C) 98.3 ?F (36.8 ?C)  ?TempSrc: Oral Oral Oral Oral  ?SpO2: 96% 97% 99% 100%  ?Weight:      ?Height:      ? ? ?Intake/Output Summary (Last 24 hours) at 05/06/2021 1615 ?Last data  filed at 05/06/2021 1100 ?Gross per 24 hour  ?Intake 240 ml  ?Output 2150 ml  ?Net -1910 ml  ? ? ? ?Wt Readings from Last 3 Encounters:  ?04/28/21 75.3 kg  ?10/11/20 76.7 kg  ?06/25/20 68.9 kg  ? ?Physical Exam ?General: Alert and oriented x 3, NAD ?Cardiovascular: S1 S2 clear, RRR. ?Respiratory: CTAB, no wheezing, rales or rhonchi ?Gastrointestinal: Soft, nontender, nondistended, NBS ?Ext: no pedal edema bilaterally ?Neuro: no new deficits ? ? ? ?Data Reviewed:  I have personally reviewed following labs  ? ? ?CBC ?Lab Results  ?Component Value Date  ? WBC 7.4 04/29/2021  ? RBC 3.61 (L) 04/29/2021  ? HGB 11.2 (L) 04/29/2021  ? HCT 32.4 (L) 04/29/2021  ? MCV 89.8 04/29/2021  ? MCH 31.0 04/29/2021  ? PLT 315 04/29/2021  ? MCHC 34.6 04/29/2021  ? RDW 14.2 04/29/2021  ? LYMPHSABS 0.8 04/28/2021  ? MONOABS 0.5 04/28/2021  ? EOSABS 0.1 04/28/2021  ? BASOSABS 0.1 04/28/2021  ? ? ? ?Last metabolic panel ?Lab Results  ?Component Value Date  ? NA 128 (L) 05/06/2021  ? K 3.8 05/06/2021  ? CL 99 05/06/2021  ? CO2 21 (L) 05/06/2021  ? BUN 8 05/06/2021  ? CREATININE 0.64 05/06/2021  ? GLUCOSE 109 (H) 05/06/2021  ? GFRNONAA >60 05/06/2021  ? GFRAA >60 06/06/2019  ? CALCIUM 8.4 (L) 05/06/2021  ? PHOS 3.6 04/29/2021  ? PROT 7.8 05/01/2021  ? ALBUMIN 3.7 05/01/2021  ? LABGLOB 4.0 (H) 04/28/2021  ? BILITOT 1.0 05/01/2021  ? ALKPHOS 67 05/01/2021  ? AST 42 (H) 05/01/2021  ? ALT 38 05/01/2021  ? ANIONGAP 8 05/06/2021  ? ? ? ? ?Coagulation Profile: ?Recent Labs  ?Lab 05/03/21 ?0606  ?INR 1.1  ? ? ? ?Radiology Studies: I have personally reviewed the imaging studies  ?CT BIOPSY ? ?Result Date: 05/03/2021 ?INDICATION: History of lung cancer.  Iliac bone lytic lesions on imaging. EXAM: IMPRESSION: Successful CT-guided RIGHT iliac lytic lesion aspiration and biopsy, as above. Michaelle Birks, MD [facility] Radiology Electronically Signed   By: Michaelle Birks M.D.   On: 05/03/2021 17:55    ? ? ? ? ?Estill Cotta M.D. ?[facility] ?05/06/2021, 4:15 PM ? ?Available via Epic secure chat 7am-7pm ?After 7 pm, please refer to night coverage provider listed on amion. ? ? ? ?

## 2021-05-06 NOTE — TOC Progression Note (Signed)
Transition of Care (TOC) - Progression Note  ? ? ?Patient Details  ?Name: Dontavian Marchi. ?MRN: [id number] ?Date of Birth: Jan 29, 1955 ? ?Transition of Care (TOC) CM/SW Contact  ?Lysette Lindenbaum, Marjie Skiff, RN ?Phone Number: ?05/06/2021, 12:38 PM ? ?Clinical Narrative:    ?Still awaiting [facility] Humbles for SNF. ? ? ?Expected Discharge Plan: [place] ?Barriers to Discharge: Continued Medical Work up ? ?Expected Discharge Plan and Services ?Expected Discharge Plan: [place] ?  ?Discharge Planning Services: CM Consult ?Post Acute Care Choice: [place] ?Living arrangements for the past 2 months: Apartment ?                ?  ?Readmission Risk Interventions ? ?  04/29/2021  ?  2:00 PM  ?Readmission Risk Prevention Plan  ?Transportation Screening Complete  ?PCP or Specialist Appt within 5-7 Days Complete  ?Home Care Screening Complete  ?Medication Review (RN CM) Complete  ? ? ?

## 2021-05-06 NOTE — Progress Notes (Signed)
Per Mikey Bussing NP, I notified pathology to send tissue to [facility] one for molecular and PDL 1 testing. Case [id number].  ?

## 2021-05-06 NOTE — Care Management Important Message (Signed)
Important Message ? ?Patient Details IM Letter placed in Patients room. ?Name: Terry Page. ?MRN: [id number] ?Date of Birth: [birth year] ? ? ?Medicare Important Message Given:  Yes ? ? ? ? ?Kerin Salen ?05/06/2021, 10:40 AM ?

## 2021-05-07 DIAGNOSIS — F1010 Alcohol abuse, uncomplicated: Secondary | ICD-10-CM | POA: Diagnosis not present

## 2021-05-07 DIAGNOSIS — E875 Hyperkalemia: Secondary | ICD-10-CM | POA: Diagnosis not present

## 2021-05-07 DIAGNOSIS — J439 Emphysema, unspecified: Secondary | ICD-10-CM | POA: Diagnosis not present

## 2021-05-07 DIAGNOSIS — F1090 Alcohol use, unspecified, uncomplicated: Secondary | ICD-10-CM | POA: Diagnosis not present

## 2021-05-07 MED ORDER — FENTANYL 25 MCG/HR TD PT72: 1.0000 | MEDICATED_PATCH | TRANSDERMAL | Status: DC

## 2021-05-07 MED ORDER — THIAMINE HCL 100 MG PO TABS: 100.0000 mg | ORAL_TABLET | Freq: Every day | ORAL | Status: DC

## 2021-05-07 MED ORDER — FENTANYL 25 MCG/HR TD PT72: 1.0000 | MEDICATED_PATCH | TRANSDERMAL | 0 refills | Status: DC

## 2021-05-07 MED ORDER — FOLIC ACID 1 MG PO TABS: 1.0000 mg | ORAL_TABLET | Freq: Every day | ORAL | Status: DC

## 2021-05-07 MED ORDER — OXYCODONE HCL 5 MG PO TABS: 5.0000 mg | ORAL_TABLET | ORAL | 0 refills | Status: DC | PRN

## 2021-05-07 MED ORDER — LIDOCAINE 5 % EX PTCH: 1.0000 | MEDICATED_PATCH | CUTANEOUS | 0 refills | Status: DC

## 2021-05-07 NOTE — Discharge Summary (Signed)
?Physician Discharge Summary ?  ?Patient: Terry Page. MRN: [id number] DOB: 1955-04-26  ?Admit date:     04/27/2021  ?Discharge date: 05/07/21  ?Discharge Physician: Kaniyah Lisby  ? ?PCP: [name], MD  ? ?Recommendations at discharge:  ?Biopsy positive for adenocarcinoma of the lung.  Ambulatory referral sent to [facility] oncology center.  Please make appointment with Dr. Lorenso Courier within next 10 to 14 days. ? ?Patient started on fentanyl patch 25 mcg/hour every 72 hours.  Please titrate as tolerated. ? ? ? ?Discharge Diagnoses: ? ?Right iliac lytic lesion, sacrum ?New diagnosis of adenocarcinoma lung ?Frequent falls ?Acute on chronic hyponatremia ?Mildly elevated AST ?  Hyperkalemia ?  HLD (hyperlipidemia) ?  Alcohol use ?  Hyponatremia ?  Malignant neoplasm of prostate ([place]) ?COPD ?AKI ?   Chronic back pain ? ? ? ?Hospital Course: ? ?Terry Page. is a 66 y.o. male with medical history significant of bullous emphysema, HTN, prostate CA, EtOH abuse, LUL Lung CA. Presented with falls. He reports that he's had falls for the past 2 days. He falls secondary to pain in his lower back. He says the back pain is chronic but seems to be worsening.  He was hemodynamically stable upon arrival and was found to have right lesions with associated soft tissue mass in the right iliac bone and left sacrum extending to the SI joint concerning for metastatic disease or myeloma ?Oncology was consulted underwent biopsy on 5/2 ? ? ?Assessment and Plan: ? ?Right iliac lytic lesion, left sacrum ?Metastatic adenocarcinoma lung ?-History of prior prostate CA, PSA however normal. ?-In the past has received SBRT to left lung for presumed lung CA ?-Oncology consulted.  CT chest abdomen pelvis showed multifocal lytic bone metastasis, lesion in the spine of left scapula, bilateral iliac bones and left sacral wing but no other malignancy ?-CEA 77.1. ?-Underwent right iliac lytic lesion aspiration and biopsy on 5/2, biopsy  consistent with adenocarcinoma lung ?-Continue pain control, fentanyl patch 25 mcg/hr, oxycodone, Lidoderm patch.   ?  ?Frequent falls ?-PT evaluation completed, recommended SNF ?  ?Acute on chronic hyponatremia ?-Urine osmolality 226, UNA 94.  Serum osmolality 261 ?-Has prior lab work indicated above SIADH ?-Sodium trending up, stable ?  ?Mildly elevated AST ?-Ultrasound liver showed fatty liver, negative for any lesion ?  ?History of alcohol use ?-Drinks 3-4 beers per day, last drink 2 days prior to admission ?-Currently no withdrawal symptoms ?  ?Essential hypertension ?-BP stable, antihypertensives on hold ?  ?  ?Left lung nodule: No prior biopsy of the left nodule but he received SBRT due to presumed lung CA, completed in April 2022.  ?- Defer to oncology. ?  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 ?--Status post radiation to the prostate completed 10/07/2019. ?  ?AKI: ? Resolved with IV fluid hydration.  ? ? ? ?Pain control - [employer] Controlled Substance Reporting System database was reviewed. and patient was instructed, not to drive, operate heavy machinery, perform activities at heights, swimming or participation in water activities or provide baby-sitting services while on Pain, Sleep and Anxiety Medications; until their outpatient Physician has advised to do so again. Also recommended to not to take more than prescribed Pain, Sleep and Anxiety Medications.  ?Consultants: Oncology ?Procedures performed: CT-guided biopsy of the right iliac lytic lesion ?Disposition: Skilled nursing facility ?Diet recommendation:  ?Discharge Diet Orders (From admission, onward)  ? ?  Start  Ordered  ? 05/07/21 0000  Diet - low sodium heart healthy       ? 05/07/21 1158  ? ?  ?  ? ?  ? ?Regular diet ?DISCHARGE MEDICATION: ?Allergies as of 05/07/2021   ?No Known Allergies ?  ? ?  ?Medication List  ?  ? ?TAKE these medications   ? ?acetaminophen 325 MG  tablet ?Commonly known as: TYLENOL ?Take 2 tablets (650 mg total) by mouth every 6 (six) hours as needed for mild pain or headache (fever >/= 101). ?  ?albuterol 108 (90 Base) MCG/ACT inhaler ?Commonly known as: VENTOLIN HFA ?Inhale 2 puffs into the lungs every 6 (six) hours as needed for wheezing or shortness of breath. ?  ?amLODipine 10 MG tablet ?Commonly known as: NORVASC ?Take 1 tablet by mouth daily. ?  ?atorvastatin 20 MG tablet ?Commonly known as: LIPITOR ?Take 20 mg by mouth daily. ?  ?fentaNYL 25 MCG/HR ?Commonly known as: [place] ?Place 1 patch onto the skin every 3 (three) days. ?Start taking on: May 09, 8117 ?  ?folic acid 1 MG tablet ?Commonly known as: FOLVITE ?Take 1 tablet (1 mg total) by mouth daily. ?Start taking on: May 08, 2021 ?  ?ipratropium-albuterol 0.5-2.5 (3) MG/3ML Soln ?Commonly known as: DUONEB ?Take 3 mLs by nebulization every 6 (six) hours as needed. ?What changed: reasons to take this ?  ?lidocaine 5 % ?Commonly known as: LIDODERM ?Place 1 patch onto the skin daily. Remove & Discard patch within 12 hours or as directed by MD. Apply to low back ?  ?olmesartan 40 MG tablet ?Commonly known as: BENICAR ?Take 40 mg by mouth daily. ?  ?oxyCODONE 5 MG immediate release tablet ?Commonly known as: Oxy IR/ROXICODONE ?Take 1-2 tablets (5-10 mg total) by mouth every 4 (four) hours as needed for moderate pain. ?  ?thiamine 100 MG tablet ?Take 1 tablet (100 mg total) by mouth daily. ?Start taking on: May 08, 2021 ?  ? ?  ? ?  ?  ? ? ?  ?Discharge Care Instructions  ?(From admission, onward)  ?  ? ? ?  ? ?  Start     Ordered  ? 05/07/21 0000  If the dressing is still on your incision site when you go home, remove it on the third day after your surgery date. Remove dressing if it begins to fall off, or if it is dirty or damaged before the third day.       ? 05/07/21 1158  ? ?  ?  ? ?  ? ? Follow-up Information   ? ? [name], MD. Schedule an appointment as soon as possible for a visit in 2  week(s).   ?Specialty: Family Medicine ?Why: for hospital follow-up ?Contact information: ?[address] ?[place] [address] ?[phone] ? ? ?  ?  ? ? Orson Slick, MD. Schedule an appointment as soon as possible for a visit in 2 week(s).   ?Specialty: Hematology and Oncology ?Why: for hospital follow-up.  Ambulatory referral has been sent to the office.  Please follow-up within next 10 to 14 days. ?Contact information: ?[address] ?[place] [address] ?[phone] ? ? ?  ?  ? ?  ?  ? ?  ? ?Discharge Exam: ?Danley Danker Weights  ? 04/28/21 0354  ?Weight: 75.3 kg  ? ?S: No acute complaints, pain stable, awaiting skilled nursing facility ? ? ?Vitals:  ? 05/06/21 0515 05/06/21 1357 05/06/21 2034 05/07/21 0516  ?BP: (!) 152/97 106/89 [name])  136/92 (!) 141/91  ?Pulse: 95 (!) 104 93 80  ?Resp: 18 14 15 14   ?Temp: 98.4 ?F (36.9 ?C) 98.3 ?F (36.8 ?C) 98.4 ?F (36.9 ?C) 98.2 ?F (36.8 ?C)  ?TempSrc: Oral Oral Oral Oral  ?SpO2: 99% 100% 97% 99%  ?Weight:      ?Height:      ?  ?Physical Exam ?General: Alert and oriented x 3, NAD ?Cardiovascular: S1 S2 clear, RRR. ?Respiratory: CTAB, no wheezing, rales or rhonchi ?Gastrointestinal: Soft, nontender, nondistended, NBS ?Ext: no pedal edema bilaterally ?Neuro: no new deficits ?Psych: Normal affect and demeanor, alert and oriented x3  ? ? ?Condition at discharge: fair ? ?The results of significant diagnostics from this hospitalization (including imaging, microbiology, ancillary and laboratory) are listed below for reference.  ? ?Imaging Studies: ?CT Lumbar Spine Wo Contrast ? ?Result Date: 04/27/2021 ?CLINICAL DATA:  Low back pain EXAM: CT LUMBAR SPINE WITHOUT CONTRAST TECHNIQUE: Multidetector CT imaging of the lumbar spine was performed without intravenous contrast administration. Multiplanar CT image reconstructions were also generated. RADIATION DOSE REDUCTION: This exam was performed according to the departmental dose-optimization program which includes automated  exposure control, adjustment of the mA and/or kV according to patient size and/or use of iterative reconstruction technique. COMPARISON:  CT 04/25/2020 and MRI spine 04/24/2020 FINDINGS: Segmentation: 5 lumbar type verte

## 2021-05-07 NOTE — TOC Transition Note (Signed)
Transition of Care (TOC) - CM/SW Discharge Note ? ? ?Patient Details  ?Name: Terry Page. ?MRN: [id number] ?Date of Birth: [birth year] ? ?Transition of Care (TOC) CM/SW Contact:  ?Tawanna Cooler, RN ?Phone Number: ?05/07/2021, 2:03 PM ? ? ?Clinical Narrative:    ? ?Spoke with [place] at admissions at [facility].  She confirms receipt of the discharge summary and they do have insurance auth.   ?Patient going to room 129B.  Number to call report is [phone].   ? ?CM spoke with patient at bedside.  He confirms that his family can transport him.  He asked that CM speak with his wife, [name], who was upset that the hospital wasn't calling [facility] for transportation.  CM explained that if patient does not have a medical reason for needing [facility] (such as O2, a continuous med, or a recent fracture that needs stabilization), then [facility] would not be appropriate.  Also explained that [facility] could refuse to transport a patient if they do not find their services to be medically necessary, and there could possibly be a co-pay for using [facility]. [name] verbalized her understanding.  [name] states that it could be really late before the family could arrive to get patient.  CM called Marita Kansas in admissions at the SNF to see how late they can accept patient.  Marita Kansas states they can accept the patient 24/7.  CM relayed this to [name], and instructed [name] that once they arrive, someone would need to go inside and let the facility know that they have the patient, and they need assistance and a wheelchair.  [name] verbalized understanding.   ? ? ? ?Final next level of care: [place] ?Barriers to Discharge: Barriers Resolved ? ? ?Patient Goals and CMS Choice ?Patient states their goals for this hospitalization and ongoing recovery are:: To go home ?  ?  ?Discharge Placement ?           ?Patient chooses bed at: Other - please specify in the comment section below: ([facility]) ?Patient to be transferred to facility  by: family ?Name of family member notified: [name] - wife ?Patient and family notified of of transfer: 05/07/21 ? ?Discharge Plan and Services ?  ?Discharge Planning Services: CM Consult ?Post Acute Care Choice: [place]          ?  ?DME Agency: NA ?  ?  ?Readmission Risk Interventions ? ?  04/29/2021  ?  2:00 PM  ?Readmission Risk Prevention Plan  ?Transportation Screening Complete  ?PCP or Specialist Appt within 5-7 Days Complete  ?Home Care Screening Complete  ?Medication Review (RN CM) Complete  ? ? ? ? ? ?

## 2021-05-12 LAB — SURGICAL PATHOLOGY

## 2021-05-18 ENCOUNTER — Encounter (HOSPITAL_COMMUNITY): Payer: Self-pay | Admitting: Hematology and Oncology

## 2021-05-19 ENCOUNTER — Other Ambulatory Visit: Payer: Self-pay | Admitting: Hematology and Oncology

## 2021-05-19 ENCOUNTER — Inpatient Hospital Stay: Payer: Medicare HMO | Attending: Hematology and Oncology | Admitting: Hematology and Oncology

## 2021-05-19 DIAGNOSIS — C3412 Malignant neoplasm of upper lobe, left bronchus or lung: Secondary | ICD-10-CM

## 2021-06-05 ENCOUNTER — Emergency Department (HOSPITAL_COMMUNITY)
Admission: EM | Admit: 2021-06-05 | Discharge: 2021-06-05 | Disposition: A | Discharge status: Home / Self Care | Payer: Medicare HMO | Attending: Emergency Medicine | Admitting: Emergency Medicine

## 2021-06-05 ENCOUNTER — Encounter (HOSPITAL_COMMUNITY): Payer: Self-pay

## 2021-06-05 ENCOUNTER — Other Ambulatory Visit: Payer: Self-pay

## 2021-06-05 DIAGNOSIS — I10 Essential (primary) hypertension: Secondary | ICD-10-CM | POA: Insufficient documentation

## 2021-06-05 DIAGNOSIS — G893 Neoplasm related pain (acute) (chronic): Secondary | ICD-10-CM | POA: Insufficient documentation

## 2021-06-05 DIAGNOSIS — Z85830 Personal history of malignant neoplasm of bone: Secondary | ICD-10-CM | POA: Insufficient documentation

## 2021-06-05 MED ORDER — OXYCODONE HCL 5 MG PO TABS: 5.0000 mg | ORAL_TABLET | Freq: Four times a day (QID) | ORAL | 0 refills | Status: DC | PRN

## 2021-06-05 NOTE — ED Notes (Signed)
Pt d/c'd by previous RN below. This RN just closing the chart. This RN was not involved in care.

## 2021-06-05 NOTE — ED Provider Notes (Signed)
[facility] DEPT Terry Page Note   CSN: [id number] Arrival date & time: 06/05/21  1511     History  No chief complaint on file.   Terry Page. is a 66 y.o. male with past medical history significant for malignant neoplasm of bone with chronic pain presents with concern for significant pain that is worsened because he has not had any of his medication.  Reports that he ran out of medication a few days ago and was supposed to call his oncologist on Friday but was not able to call before 5 PM so did not get any refills of his medications.  He has been trying Tylenol with minimal relief.  He denies any chest pain, shortness of breath, fever, chills, abdominal pain, dysuria, hematuria, or other new symptoms.  He reports the pain is "everywhere, worst in back and shoulders" which is his normal pain locations.  HPI     Home Medications Prior to Admission medications   Medication Sig Start Date End Date Taking? Authorizing Terry Page  oxyCODONE (ROXICODONE) 5 MG immediate release tablet Take 1 tablet (5 mg total) by mouth every 6 (six) hours as needed for severe pain. 06/05/21  Yes Terry Jiggetts H, PA-C  acetaminophen (TYLENOL) 325 MG tablet Take 2 tablets (650 mg total) by mouth every 6 (six) hours as needed for mild pain or headache (fever >/= 101). 05/10/20   Terry Page, Terry Picket, MD  albuterol (VENTOLIN HFA) 108 (90 Base) MCG/ACT inhaler Inhale 2 puffs into the lungs every 6 (six) hours as needed for wheezing or shortness of breath. 06/06/19 04/29/22  Terry Page., MD  amLODipine (NORVASC) 10 MG tablet Take 1 tablet by mouth daily. 06/14/20   Terry Page, Historical, MD  atorvastatin (LIPITOR) 20 MG tablet Take 20 mg by mouth daily. 01/28/19   Terry Page, Historical, MD  fentaNYL (DURAGESIC) 25 MCG/HR Place 1 patch onto the skin every 3 (three) days. 05/09/21   Terry Page, Terry Emerald, MD  folic acid (FOLVITE) 1 MG tablet Take 1 tablet (1 mg total) by mouth daily.  05/08/21   Terry Page, Terry K, MD  ipratropium-albuterol (DUONEB) 0.5-2.5 (3) MG/3ML SOLN Take 3 mLs by nebulization every 6 (six) hours as needed. Patient taking differently: Take 3 mLs by nebulization every 6 (six) hours as needed (shortness of breathing or wheezing). 05/10/20   Terry Page, Terry Picket, MD  lidocaine (LIDODERM) 5 % Place 1 patch onto the skin daily. Remove & Discard patch within 12 hours or as directed by MD. Apply to low back 05/07/21   Terry Page, Terry K, MD  olmesartan (BENICAR) 40 MG tablet Take 40 mg by mouth daily. 03/03/21   Terry Page, Historical, MD  thiamine 100 MG tablet Take 1 tablet (100 mg total) by mouth daily. 05/08/21   Terry Corning, MD      Allergies    Patient has no known allergies.    Review of Systems   Review of Systems  All other systems reviewed and are negative.  Physical Exam Updated Vital Signs BP 113/76 (BP Location: Left Arm)   Pulse 96   Temp 98.1 F (36.7 C) (Oral)   Resp 17   Ht 5\' 7"  (1.702 m)   Wt 72.6 kg   SpO2 99%   BMI 25.06 kg/m  Physical Exam Vitals and nursing note reviewed.  Constitutional:      General: He is not in acute distress.    Appearance: Normal appearance. He is ill-appearing.  HENT:  Head: Normocephalic and atraumatic.  Eyes:     General:        Right eye: No discharge.        Left eye: No discharge.  Cardiovascular:     Rate and Rhythm: Normal rate and regular rhythm.     Pulses: Normal pulses.  Pulmonary:     Effort: Pulmonary effort is normal. No respiratory distress.  Musculoskeletal:        General: No deformity.     Comments: Moves all 4 limbs spontaneously, intact and 5 out of 5 bilateral upper and lower extremities.  Tenderness to palpation of left shoulder, and left spine without step-off, deformity, evidence of overlying skin changes.  Skin:    General: Skin is warm and dry.     Capillary Refill: Capillary refill takes less than 2 seconds.  Neurological:     Mental Status: He is alert and oriented  to person, place, and time.  Psychiatric:        Mood and Affect: Mood normal.        Behavior: Behavior normal.    ED Results / Procedures / Treatments   Labs (all labs ordered are listed, but only abnormal results are displayed) Labs Reviewed - No data to display  EKG None  Radiology No results found.  Procedures Procedures    Medications Ordered in ED Medications - No data to display  ED Course/ Medical Decision Making/ A&P                           Medical Decision Making This is a chronically ill-appearing 66 year old male who presents with concern for acute on chronic pain related to his metastatic bone cancer.  He denies any chest pain, shortness of breath, fever, chills, abdominal pain, dysuria, hematuria, new numbness, tingling or other new health abnormalities.  He denies any recent fall, head injury.  He reports he is using Tylenol as prescribed and is just not giving him the relief he needs for his pain.  Patient is requesting a short bridge for his narcotic pain medication prescription and before he can follow-up with his Oncologist.  Patient has documented history of cancer, history of chronic pain, and previous appropriate narcotic use I think this is reasonable in context of patient's significant malignancy related pain.  We will give him a short course of his narcotic pain medication and encouraged follow-up with his oncologist in the morning.  Discussed extensive return precautions for any new health concerns today.   final Clinical Impression(s) / ED Diagnoses Final diagnoses:  Chronic pain due to neoplasm    Rx / DC Orders ED Discharge Orders          Ordered    oxyCODONE (ROXICODONE) 5 MG immediate release tablet  Every 6 hours PRN        06/05/21 1636              Terry Page, Terry H, PA-C 06/05/21 1641    Terry Morgan, MD 06/06/21 2205

## 2021-06-05 NOTE — Discharge Instructions (Addendum)
Continue to use your Tylenol 1000 mg every 6 hours for pain, you can use the narcotic pain medication for breakthrough pain.  Please contact your oncologist the morning for a more long-term refill of your prescription pain medications.  Please return to the emergency department if you have any concern for chest pain, shortness of breath, fever, chills, or other new health concerns.

## 2021-06-05 NOTE — ED Triage Notes (Signed)
Per EMS- Patient reports a history of bone cancer and states he has been taking Tylenol with no relief. Patient is wanting a Rx for pain meds.

## 2021-06-06 ENCOUNTER — Telehealth: Payer: Self-pay | Admitting: *Deleted

## 2021-06-06 NOTE — Telephone Encounter (Signed)
Received call from pt's wife. She states her husband is in need of a refill of his oxycodone.  He is a recent hospital discharge and will be seeing Dr. Lorenso Courier in clinic on 06/13/21. Pt was seen in the ED yesterday for pain and received only 10 tabs of oxycodone 5 mg (1 v ery 6 hours)  Dr. Lorenso Courier made aware. Pt uses [facility] on [address].

## 2021-06-07 ENCOUNTER — Other Ambulatory Visit: Payer: Self-pay | Admitting: Hematology and Oncology

## 2021-06-07 MED ORDER — OXYCODONE HCL 5 MG PO TABS: 5.0000 mg | ORAL_TABLET | Freq: Four times a day (QID) | ORAL | 0 refills | Status: DC | PRN

## 2021-06-13 ENCOUNTER — Inpatient Hospital Stay: Payer: Medicare HMO | Admitting: Hematology and Oncology

## 2021-06-20 ENCOUNTER — Telehealth: Payer: Self-pay

## 2021-06-20 ENCOUNTER — Other Ambulatory Visit: Payer: Self-pay

## 2021-06-20 ENCOUNTER — Inpatient Hospital Stay: Payer: Medicare HMO

## 2021-06-20 ENCOUNTER — Other Ambulatory Visit (HOSPITAL_COMMUNITY): Payer: Self-pay

## 2021-06-20 ENCOUNTER — Inpatient Hospital Stay (HOSPITAL_BASED_OUTPATIENT_CLINIC_OR_DEPARTMENT_OTHER): Payer: Medicare HMO | Admitting: Nurse Practitioner

## 2021-06-20 ENCOUNTER — Encounter: Payer: Self-pay | Admitting: Nurse Practitioner

## 2021-06-20 ENCOUNTER — Inpatient Hospital Stay: Payer: Medicare HMO | Attending: Hematology and Oncology | Admitting: Hematology and Oncology

## 2021-06-20 VITALS — BP 135/83 | HR 108 | Temp 97.3°F | Resp 20 | Wt 152.1 lb

## 2021-06-20 DIAGNOSIS — Z515 Encounter for palliative care: Secondary | ICD-10-CM | POA: Insufficient documentation

## 2021-06-20 DIAGNOSIS — C3412 Malignant neoplasm of upper lobe, left bronchus or lung: Secondary | ICD-10-CM

## 2021-06-20 DIAGNOSIS — C61 Malignant neoplasm of prostate: Secondary | ICD-10-CM

## 2021-06-20 DIAGNOSIS — F1021 Alcohol dependence, in remission: Secondary | ICD-10-CM | POA: Diagnosis not present

## 2021-06-20 DIAGNOSIS — K5903 Drug induced constipation: Secondary | ICD-10-CM

## 2021-06-20 DIAGNOSIS — Z8546 Personal history of malignant neoplasm of prostate: Secondary | ICD-10-CM | POA: Insufficient documentation

## 2021-06-20 DIAGNOSIS — G893 Neoplasm related pain (acute) (chronic): Secondary | ICD-10-CM

## 2021-06-20 DIAGNOSIS — Z7189 Other specified counseling: Secondary | ICD-10-CM

## 2021-06-20 DIAGNOSIS — K5900 Constipation, unspecified: Secondary | ICD-10-CM | POA: Insufficient documentation

## 2021-06-20 DIAGNOSIS — Z87891 Personal history of nicotine dependence: Secondary | ICD-10-CM | POA: Insufficient documentation

## 2021-06-20 DIAGNOSIS — I10 Essential (primary) hypertension: Secondary | ICD-10-CM | POA: Insufficient documentation

## 2021-06-20 DIAGNOSIS — Z923 Personal history of irradiation: Secondary | ICD-10-CM | POA: Diagnosis not present

## 2021-06-20 DIAGNOSIS — R530 Neoplastic (malignant) related fatigue: Secondary | ICD-10-CM

## 2021-06-20 LAB — CMP (CANCER CENTER ONLY)
ALT: 11 U/L (ref 0–44)
AST: 16 U/L (ref 15–41)
Albumin: 4.1 g/dL (ref 3.5–5.0)
Alkaline Phosphatase: 107 U/L (ref 38–126)
Anion gap: 9 (ref 5–15)
BUN: 7 mg/dL — ABNORMAL LOW (ref 8–23)
CO2: 26 mmol/L (ref 22–32)
Calcium: 9.8 mg/dL (ref 8.9–10.3)
Chloride: 99 mmol/L (ref 98–111)
Creatinine: 0.68 mg/dL (ref 0.61–1.24)
GFR, Estimated: >60 mL/min (ref >60)
Glucose, Bld: 110 mg/dL — ABNORMAL HIGH (ref 70–99)
Potassium: 3.4 mmol/L — ABNORMAL LOW (ref 3.5–5.1)
Sodium: 134 mmol/L — ABNORMAL LOW (ref 135–145)
Total Bilirubin: 0.5 mg/dL (ref 0.3–1.2)
Total Protein: 8.6 g/dL — ABNORMAL HIGH (ref 6.5–8.1)

## 2021-06-20 LAB — CBC WITH DIFFERENTIAL (CANCER CENTER ONLY)
Abs Immature Granulocytes: 0.05 10*3/uL (ref 0.00–0.07)
Basophils Absolute: 0.1 10*3/uL (ref 0.0–0.1)
Basophils Relative: 1 %
Eosinophils Absolute: 0.5 10*3/uL (ref 0.0–0.5)
Eosinophils Relative: 4 %
HCT: 32.4 % — ABNORMAL LOW (ref 39.0–52.0)
Hemoglobin: 11 g/dL — ABNORMAL LOW (ref 13.0–17.0)
Immature Granulocytes: 0 %
Lymphocytes Relative: 13 %
Lymphs Abs: 1.5 10*3/uL (ref 0.7–4.0)
MCH: 29.3 pg (ref 26.0–34.0)
MCHC: 34 g/dL (ref 30.0–36.0)
MCV: 86.2 fL (ref 80.0–100.0)
Monocytes Absolute: 1.2 10*3/uL — ABNORMAL HIGH (ref 0.1–1.0)
Monocytes Relative: 10 %
Neutro Abs: 8.3 10*3/uL — ABNORMAL HIGH (ref 1.7–7.7)
Neutrophils Relative %: 72 %
Platelet Count: 475 10*3/uL — ABNORMAL HIGH (ref 150–400)
RBC: 3.76 MIL/uL — ABNORMAL LOW (ref 4.22–5.81)
RDW: 15.4 % (ref 11.5–15.5)
WBC Count: 11.6 10*3/uL — ABNORMAL HIGH (ref 4.0–10.5)
nRBC: 0 % (ref 0.0–0.2)

## 2021-06-20 MED ORDER — HYDROXYZINE HCL 25 MG PO TABS
25.0000 mg | ORAL_TABLET | Freq: Every evening | ORAL | 1 refills | Status: DC | PRN
  Filled 2021-06-20 (×2): qty 30, 30d supply, fill #0

## 2021-06-20 MED ORDER — SENNOSIDES-DOCUSATE SODIUM 8.6-50 MG PO TABS: 2.0000 | ORAL_TABLET | Freq: Every day | ORAL | 2 refills | Status: DC

## 2021-06-20 MED ORDER — MORPHINE SULFATE ER 15 MG PO TBCR: 15.0000 mg | EXTENDED_RELEASE_TABLET | Freq: Two times a day (BID) | ORAL | 0 refills | Status: DC

## 2021-06-20 MED ORDER — POLYETHYLENE GLYCOL 3350 17 G PO PACK
17.0000 g | PACK | Freq: Every day | ORAL | 2 refills | Status: DC
  Filled 2021-06-20: qty 30, 30d supply, fill #0

## 2021-06-20 MED ORDER — POLYETHYLENE GLYCOL 3350 17 G PO PACK
17.0000 g | PACK | Freq: Every day | ORAL | 2 refills | Status: DC
Start: 2021-06-20 — End: 2022-07-07

## 2021-06-20 MED ORDER — HYDROXYZINE HCL 25 MG PO TABS: 25.0000 mg | ORAL_TABLET | Freq: Every evening | ORAL | 1 refills | Status: DC | PRN

## 2021-06-20 MED ORDER — OXYCODONE HCL ER 10 MG PO T12A
10.0000 mg | EXTENDED_RELEASE_TABLET | Freq: Two times a day (BID) | ORAL | 0 refills | Status: DC
  Filled 2021-06-20: qty 60, 30d supply, fill #0

## 2021-06-20 MED ORDER — SENNOSIDES-DOCUSATE SODIUM 8.6-50 MG PO TABS
2.0000 | ORAL_TABLET | Freq: Every day | ORAL | 2 refills | Status: DC
  Filled 2021-06-20: qty 60, 30d supply, fill #0

## 2021-06-20 NOTE — Progress Notes (Signed)
Referral made to Penni Homans, NP for pain management as pt is very concerned about his pain. She will be able to see him after labs today.  Transportation referral done

## 2021-06-20 NOTE — Telephone Encounter (Signed)
Per [facility] pharmacy, pt insurance does not cover OxyContin, prefer medication was morphine ER, orders placed per Nikki<NP and confirmed with to preferred pharmacy. Pt made aware, verbalized understanding and had no further questions or concerns at this time.

## 2021-06-20 NOTE — Progress Notes (Signed)
[facility]  Telephone:(336) [phone] [phone]) [phone]   Name: Terry Page. Date: 06/20/2021 MRN: [id number]  DOB: 03/23/1955  Patient Care Team: [name], MD as PCP - General (Family Medicine)    REASON FOR CONSULTATION: Terry Pellot. is a 66 y.o. 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  Palliative ask to see for symptom management.   SOCIAL HISTORY:     reports that he quit smoking about 18 months ago. His smoking use included cigarettes. He has never used smokeless tobacco. He reports that he does not currently use alcohol after a past usage of about 12.0 standard drinks of alcohol per week. He reports that he does not use drugs.  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 Gery Pray   Incidental lung nodule, greater than or equal to 2mm 10/22/2017   Left upper lobe - discovered on CTA   Prostate cancer ([employer])    Spontaneous pneumothorax 10/20/2017   right   Tobacco abuse     PAST SURGICAL HISTORY:  Past Surgical History:  Procedure Laterality Date   BACK SURGERY     PROSTATE BIOPSY      HEMATOLOGY/ONCOLOGY HISTORY:  Oncology History   No history exists.    ALLERGIES:  has No Known Allergies.  MEDICATIONS:  Current Outpatient Medications  Medication Sig Dispense Refill   morphine (MS CONTIN) 15 MG 12 hr tablet Take 1 tablet (15 mg total) by mouth every 12 (twelve) hours. 60 tablet 0   acetaminophen (TYLENOL) 325 MG tablet Take 2 tablets (650 mg total) by mouth every 6 (six) hours as needed for mild  pain or headache (fever >/= 101).     albuterol (VENTOLIN HFA) 108 (90 Base) MCG/ACT inhaler Inhale 2 puffs into the lungs every 6 (six) hours as needed for wheezing or shortness of breath. 8 g 1   amLODipine (NORVASC) 10 MG tablet Take 1 tablet by mouth daily.     atorvastatin (LIPITOR) 20 MG tablet Take 20 mg by mouth daily.     folic acid (FOLVITE) 1 MG tablet Take 1 tablet (1 mg total) by mouth daily.     hydrOXYzine (ATARAX) 25 MG tablet Take 1 tablet (25 mg total) by mouth at bedtime as needed for itching. 30 tablet 1   ipratropium-albuterol (DUONEB) 0.5-2.5 (3) MG/3ML SOLN Take 3 mLs by nebulization every 6 (six) hours as needed. (Patient taking differently: Take 3 mLs by nebulization every 6 (six) hours as needed (shortness of breathing or wheezing).) 360 mL 0   lidocaine (LIDODERM) 5 % Place 1 patch onto the skin daily. Remove & Discard patch within 12 hours or as directed by MD. Apply to low back 30 patch 0   losartan (COZAAR) 100 MG tablet Take 100 mg by mouth daily.     olmesartan (BENICAR) 40 MG tablet Take 40 mg by mouth daily.     oxyCODONE (ROXICODONE) 5 MG immediate release tablet Take 1 tablet (5 mg total) by mouth every 6 (six) hours as needed for severe pain. 120 tablet 0  polyethylene glycol (MIRALAX) 17 g packet Take 17 g by mouth daily. 30 each 2   senna-docusate (SENNA S) 8.6-50 MG tablet Take 2 tablets by mouth daily. 60 tablet 2   thiamine 100 MG tablet Take 1 tablet (100 mg total) by mouth daily.     No current facility-administered medications for this visit.    VITAL SIGNS: There were no vitals taken for this visit. There were no vitals filed for this visit.  Estimated body mass index is 23.82 kg/m as calculated from the following:   Height as of 06/05/21: 5\' 7"  (1.702 m).   Weight as of an earlier encounter on 06/20/21: 152 lb 1.6 oz (69 kg).  LABS: CBC:    Component Value Date/Time   WBC 11.6 (H) 06/20/2021 1245   WBC 7.4 04/29/2021 0114   HGB 11.0 (L)  06/20/2021 1245   HCT 32.4 (L) 06/20/2021 1245   PLT 475 (H) 06/20/2021 1245   MCV 86.2 06/20/2021 1245   NEUTROABS 8.3 (H) 06/20/2021 1245   LYMPHSABS 1.5 06/20/2021 1245   MONOABS 1.2 (H) 06/20/2021 1245   EOSABS 0.5 06/20/2021 1245   BASOSABS 0.1 06/20/2021 1245   Comprehensive Metabolic Panel:    Component Value Date/Time   NA 134 (L) 06/20/2021 1245   K 3.4 (L) 06/20/2021 1245   CL 99 06/20/2021 1245   CO2 26 06/20/2021 1245   BUN 7 (L) 06/20/2021 1245   CREATININE 0.68 06/20/2021 1245   GLUCOSE 110 (H) 06/20/2021 1245   CALCIUM 9.8 06/20/2021 1245   AST 16 06/20/2021 1245   ALT 11 06/20/2021 1245   ALKPHOS 107 06/20/2021 1245   BILITOT 0.5 06/20/2021 1245   PROT 8.6 (H) 06/20/2021 1245   ALBUMIN 4.1 06/20/2021 1245    RADIOGRAPHIC STUDIES: No results found.  PERFORMANCE STATUS (ECOG) : 1 - Symptomatic but completely ambulatory  Review of Systems  Constitutional:  Positive for fatigue.  Musculoskeletal:  Positive for arthralgias and back pain.  Unless otherwise noted, a complete review of systems is negative.  Physical Exam General: NAD, well developed Cardiovascular: regular rate and rhythm Pulmonary: clear ant fields Extremities: no edema, no joint deformities Skin: no rashes Neurological: AAOx3, mood appropriate  IMPRESSION: This is my initial visit with Terry Page.  No acute distress noted.  Ambulatory with rollator.  I introduced myself, Maygan RN, and Palliative's role in collaboration with the oncology team. Concept of Palliative Care was introduced as specialized medical care for people and their families living with serious illness.  It focuses on providing relief from the symptoms and stress of a serious illness.  The goal is to improve quality of life for both the patient and the family. Values and goals of care important to patient and family were attempted to be elicited.   Terry Page lives in the home alone.  He has 4 children (2 local, Terry Page, 1 MD).   He also has local support from his ex-wife.  He is originally from [place] and has been here in [place] for the past 9 years.  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  Able to perform all ADLs independently in the home.  Does require transportation.  Reports he spends most of his time in his recliner due to pain.  Appetite is good.  Denies nausea, vomiting, diarrhea.  Endorses occasional constipation.  Recently discharged to SNF rehab 1.5 months ago but is now back in his home which she  is appreciative of.  Neoplasm related pain Terry Page complains of ongoing shoulder, back, and lower extremity pain.  He rates his pain 8-10/10 at its worse.  Increase activity causes increased pain.  When taking prescribed pain medication pain will decrease to 7/10 but intensifies within hours.  He was previously on fentanyl 25 mcg patch however this made him feel "weird" so he requested it be discontinued.  He is currently taking Oxy IR 5-10 mg every 6 hours.  Education provided on use of long-acting pain medication in addition to breakthrough medication with a goal of offering better pain control.  He verbalized understanding and willingness to try anything to provide him additional relief.  He speaks to his decreased quality of life due to constant pain.  Per not endorsing patient will begin taking Oxy IR 10 mg every 6 hours as needed.  He understands he can continue with this regimen in addition to newly prescribed long-acting.  We will continue to closely monitor and adjust as needed.  Constipation Reports occasional concerns with constipation.  Is not taking any stool softeners at this time.  Education provided on the use of MiraLAX daily in addition to senna for bowel regimen in the setting of opioid use.  He verbalized understanding.  Terry Page is aware if he begins having loose stools he may take senna as needed.  We discussed his current illness and what  it means in the larger context of Her on-going co-morbidities. Natural disease trajectory and expectations were discussed.  Terry Page is clear in his understanding of current illness.  He is remaining hopeful for stability.  Is clear in his expressed wishes to continue to treat the treatable aggressively while taking life 1 day at a time.  I discussed the importance of continued conversation with family and their medical providers regarding overall plan of care and treatment options, ensuring decisions are within the context of the patients values and GOCs.  PLAN: Establish therapeutic relationship.  Education provided on palliative's role in collaboration with his oncology team.   Oxy IR 10 mg every 6 hours as needed for breakthrough pain as prescribed  Originally sent in oxycodone ER 10 mg every 12 hours.  However pharmacy was contacted by pharmacy that patient's insurance plan does not cover oxycodone and has recommended use of MS Contin.  MS Contin 15 mg every 12 hours has been sent to patient's local pharmacy.   Hydroxyzine 25 mg at bedtime as needed for itching  Senna 1 tablet daily for bowel regimen  MiraLAX daily for bowel regimen in the setting of opioid use  I will plan to see patient back in 2-4 weeks in collaboration to other oncology appointments.  Patient knows to call sooner if needed.  We will plan to contact patient by phone at the end of the week to see how he is tolerating MS Contin.  Patient expressed understanding and was in agreement with this plan. He also understands that He can call the clinic at any time with any questions, concerns, or complaints.   Thank you for your referral and allowing Palliative to assist in Mr. TSUTOMU BARFOOT Jr.'s care.   Number and complexity of problems addressed: 2 HIGH - 1 or more chronic illnesses with SEVERE exacerbation, progression, or side effects of treatment - advanced cancer, pain. Any controlled substances utilized were prescribed  in the context of palliative care.  Time Total: 55 min  Visit consisted of counseling and education dealing with the complex and emotionally intense  issues of symptom management and palliative care in the setting of serious and potentially life-threatening illness.Greater than 50%  of this time was spent counseling and coordinating care related to the above assessment and plan.  Signed by: Alda Lea, AGPCNP-BC Palliative Medicine Team/[facility]

## 2021-06-21 ENCOUNTER — Other Ambulatory Visit: Payer: Self-pay | Admitting: Hematology and Oncology

## 2021-06-21 MED ORDER — ONDANSETRON HCL 8 MG PO TABS: 8.0000 mg | ORAL_TABLET | Freq: Three times a day (TID) | ORAL | 0 refills | Status: DC | PRN

## 2021-06-21 MED ORDER — PROCHLORPERAZINE MALEATE 10 MG PO TABS: 10.0000 mg | ORAL_TABLET | Freq: Four times a day (QID) | ORAL | 0 refills | Status: DC | PRN

## 2021-06-21 MED ORDER — LIDOCAINE-PRILOCAINE 2.5-2.5 % EX CREA
1.0000 | TOPICAL_CREAM | CUTANEOUS | 0 refills | Status: DC | PRN
Start: 2021-06-21 — End: 2023-04-25

## 2021-06-21 NOTE — Progress Notes (Signed)
START ON PATHWAY REGIMEN - Non-Small Cell Lung     A cycle is every 21 days:     Pembrolizumab      Pemetrexed      Carboplatin   **Always confirm dose/schedule in your pharmacy ordering system**  Patient Characteristics: Stage IV Metastatic, Nonsquamous, Molecular Analysis Completed, Molecular Alteration Present and Targeted Therapy Exhausted OR EGFR Exon 20+ or KRAS G12C+ or HER2+ Present and No Prior Chemo/Immunotherapy OR No Alteration Present, Initial  Chemotherapy/Immunotherapy, PS = 2, No Alteration Present, No Alteration Present, PD-L1 Expression Positive 1-49% (TPS) / Negative / Not Tested / Awaiting Test Results Therapeutic Status: Stage IV Metastatic Histology: Nonsquamous Cell Broad Molecular Profiling Status: Molecular Analysis Completed Molecular Analysis Results: No Alteration Present ECOG Performance Status: 2 Chemotherapy/Immunotherapy Line of Therapy: Initial Chemotherapy/Immunotherapy EGFR Exons 18-21 Mutation Testing Status: Completed and Negative ALK Fusion/Rearrangement Testing Status: Completed and Negative BRAF V600 Mutation Testing Status: Completed and Negative KRAS G12C Mutation Testing Status: Completed and Negative MET Exon 14 Mutation Testing Status: Completed and Negative RET Fusion/Rearrangement Testing Status: Completed and Negative HER2 Mutation Testing Status: Completed and Negative NTRK Fusion/Rearrangement Testing Status: Completed and Negative ROS1 Fusion/Rearrangement Testing Status: Completed and Negative PD-L1 Expression Status: PD-L1 Positive 1-49% (TPS) Intent of Therapy: Non-Curative / Palliative Intent, Discussed with Patient 

## 2021-06-21 NOTE — Progress Notes (Signed)
[facility] [phone]   Fax:(336) [phone]  PROGRESS NOTE  Patient Care Team: [name], MD as PCP - General (Family Medicine)  Hematological/Oncological History # Metastatic Adenocarcinoma of the Lung  01/07/2020 : CT of the chest with contrast performed which showed interval enlargement of a spiculated nodule in the central left upper lobe measuring 1.2 x 1.0 cm and highly suspicious for primary lung malignancy 02/06/2020 :PET scan performed which showed a hypermetabolic irregular solid 1.3 cm left upper lobe pulmonary nodule compatible with malignancy without any other hypermetabolic lesions 07/02/624- 04/19/2020:  SBRT to the lung lesion 04/28/2021: CT C/A/P showed multifocal lytic bone metastases are identified. Lesion within the spine of the left scapula and lesions involving bilateral iliac bones and left sacral wing. Establish care with Dr. Lorenso Courier  05/03/2021: biopsy of right iliac lytic lesion showed metastatic carcinoma, consistent with lung primary.   #Adenocarcinoma of the Prostate, [id number] 08/19/2019-10/07/2019: 70 Gy in 28 fractions of 2.5 Gy.  Radiation to the prostate was under the care of Dr. Tyler Pita   Interval History:  Terry Page. 66 y.o. male with medical history significant for newly diagnosed metastatic adenocarcinoma of the lung who presents for a follow up visit. The patient's last visit was on 05/05/2021 while admitted to the hospital. In the interim since the last visit pathology results have returned consistent with adenocarcinoma of the lung with no targetable mutations.  On exam today [name] notes that he has improved in the interim since his discharge from the hospital.  He has gone through rehab for a month or so and has improved his mobility.  He notes that he continues to suffer from pain.  He notes he is taking at least 2 of the 5 mg tablets of oxycodone every 6 hours.  He is also lost weight and has dropped down  approximately 17 pounds.  He notes he is not having any issues with nausea, vomiting, or diarrhea.  He denies any fevers, chills, sweats.  A full 10 point ROS is listed below.  The bulk of our discussion focused on the diagnosis of adenocarcinoma of the lung and treatment moving forward.  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 Gery Pray   Incidental lung nodule, greater than or equal to 65mm 10/22/2017   Left upper lobe - discovered on CTA   Prostate cancer ([place])    Spontaneous pneumothorax 10/20/2017   right   Tobacco abuse     SURGICAL HISTORY: Past Surgical History:  Procedure Laterality Date   BACK SURGERY     PROSTATE BIOPSY      SOCIAL HISTORY: Social History   Socioeconomic History   Marital status: Single    Spouse name: Not on file   Number of children: Not on file   Years of education: Not on file   Highest education level: Not on file  Occupational History   Occupation: retired  Tobacco Use   Smoking status: Former    Types: Cigarettes    Quit date: 11/22/2019    Years since quitting: 1.5   Smokeless tobacco: Never   Tobacco comments:    Patient reports quit 3 years ago. 06/25/20. [facility]  Vaping Use   Vaping Use: Never used  Substance and Sexual  Activity   Alcohol use: Not Currently    Alcohol/week: 12.0 standard drinks of alcohol    Types: 12 Cans of beer per week    Comment: daily   Drug use: No   Sexual activity: Not Currently  Other Topics Concern   Not on file  Social History Narrative   Not on file   Social Determinants of Health   Financial Resource Strain: Not on file  Food Insecurity: Not on file  Transportation Needs: Not on file  Physical Activity: Not on file  Stress: Not on file  Social Connections: Not on file  Intimate Partner  Violence: Not on file    FAMILY HISTORY: Family History  Problem Relation Age of Onset   Cancer Cousin        maternal cousin   Cancer Cousin        paternal cousin   Cancer Cousin    Colon polyps Neg Hx    Pancreatic disease Neg Hx    Pancreatic cancer Neg Hx    Breast cancer Neg Hx    Colon cancer Neg Hx     ALLERGIES:  has No Known Allergies.  MEDICATIONS:  Current Outpatient Medications  Medication Sig Dispense Refill   acetaminophen (TYLENOL) 325 MG tablet Take 2 tablets (650 mg total) by mouth every 6 (six) hours as needed for mild pain or headache (fever >/= 101).     albuterol (VENTOLIN HFA) 108 (90 Base) MCG/ACT inhaler Inhale 2 puffs into the lungs every 6 (six) hours as needed for wheezing or shortness of breath. 8 g 1   amLODipine (NORVASC) 10 MG tablet Take 1 tablet by mouth daily.     atorvastatin (LIPITOR) 20 MG tablet Take 20 mg by mouth daily.     folic acid (FOLVITE) 1 MG tablet Take 1 tablet (1 mg total) by mouth daily.     hydrOXYzine (ATARAX) 25 MG tablet Take 1 tablet (25 mg total) by mouth at bedtime as needed for itching. 30 tablet 1   ipratropium-albuterol (DUONEB) 0.5-2.5 (3) MG/3ML SOLN Take 3 mLs by nebulization every 6 (six) hours as needed. (Patient taking differently: Take 3 mLs by nebulization every 6 (six) hours as needed (shortness of breathing or wheezing).) 360 mL 0   lidocaine (LIDODERM) 5 % Place 1 patch onto the skin daily. Remove & Discard patch within 12 hours or as directed by MD. Apply to low back 30 patch 0   losartan (COZAAR) 100 MG tablet Take 100 mg by mouth daily.     morphine (MS CONTIN) 15 MG 12 hr tablet Take 1 tablet (15 mg total) by mouth every 12 (twelve) hours. 60 tablet 0   olmesartan (BENICAR) 40 MG tablet Take 40 mg by mouth daily.     oxyCODONE (ROXICODONE) 5 MG immediate release tablet Take 1 tablet (5 mg total) by mouth every 6 (six) hours as needed for severe pain. 120 tablet 0   polyethylene glycol (MIRALAX) 17 g packet  Take 17 g by mouth daily. 30 each 2   senna-docusate (SENNA S) 8.6-50 MG tablet Take 2 tablets by mouth daily. 60 tablet 2   thiamine 100 MG tablet Take 1 tablet (100 mg total) by mouth daily.     No current facility-administered medications for this visit.    REVIEW OF SYSTEMS:   Constitutional: ( - ) fevers, ( - )  chills , ( - ) night sweats Eyes: ( - ) blurriness of vision, ( - ) double vision, ( - )  watery eyes Ears, nose, mouth, throat, and face: ( - ) mucositis, ( - ) sore throat Respiratory: ( - ) cough, ( - ) dyspnea, ( - ) wheezes Cardiovascular: ( - ) palpitation, ( - ) chest discomfort, ( - ) lower extremity swelling Gastrointestinal:  ( - ) nausea, ( - ) heartburn, ( - ) change in bowel habits Skin: ( - ) abnormal skin rashes Lymphatics: ( - ) new lymphadenopathy, ( - ) easy bruising Neurological: ( - ) numbness, ( - ) tingling, ( - ) new weaknesses Behavioral/Psych: ( - ) mood change, ( - ) new changes  All other systems were reviewed with the patient and are negative.  PHYSICAL EXAMINATION: ECOG PERFORMANCE STATUS: 2 - Symptomatic, <50% confined to bed  Vitals:   06/20/21 1141  BP: 135/83  Pulse: (!) 108  Resp: 20  Temp: (!) 97.3 F (36.3 C)  SpO2: 100%   Filed Weights   06/20/21 1141  Weight: 152 lb 1.6 oz (69 kg)    GENERAL: Well-appearing elderly African-American male, alert, no distress and comfortable SKIN: skin color, texture, turgor are normal, no rashes or significant lesions EYES: conjunctiva are pink and non-injected, sclera clear LUNGS: clear to auscultation and percussion with normal breathing effort HEART: regular rate & rhythm and no murmurs and no lower extremity edema Musculoskeletal: no cyanosis of digits and no clubbing  PSYCH: alert & oriented x 3, fluent speech NEURO: no focal motor/sensory deficits  LABORATORY DATA:  I have reviewed the data as listed    Latest Ref Rng & Units 06/20/2021   12:45 PM 04/29/2021    1:14 AM 04/28/2021     7:42 AM  CBC  WBC 4.0 - 10.5 K/uL 11.6  7.4  11.0   Hemoglobin 13.0 - 17.0 g/dL 11.0  11.2  11.8   Hematocrit 39.0 - 52.0 % 32.4  32.4  33.8   Platelets 150 - 400 K/uL 475  315  367        Latest Ref Rng & Units 06/20/2021   12:45 PM 05/06/2021    5:42 AM 05/05/2021    5:34 AM  CMP  Glucose 70 - 99 mg/dL 110  109    BUN 8 - 23 mg/dL 7  8    Creatinine 0.61 - 1.24 mg/dL 0.68  0.64  0.84   Sodium 135 - 145 mmol/L 134  128    Potassium 3.5 - 5.1 mmol/L 3.4  3.8    Chloride 98 - 111 mmol/L 99  99    CO2 22 - 32 mmol/L 26  21    Calcium 8.9 - 10.3 mg/dL 9.8  8.4    Total Protein 6.5 - 8.1 g/dL 8.6     Total Bilirubin 0.3 - 1.2 mg/dL 0.5     Alkaline Phos 38 - 126 U/L 107     AST 15 - 41 U/L 16     ALT 0 - 44 U/L 11       Lab Results  Component Value Date   MPROTEIN Not Observed 04/28/2021   Lab Results  Component Value Date   KPAFRELGTCHN 33.6 (H) 04/28/2021   LAMBDASER 22.0 04/28/2021   KAPLAMBRATIO 1.53 04/28/2021    RADIOGRAPHIC STUDIES: No results found.  ASSESSMENT & PLAN Sandip Power. 66 y.o. male with medical history significant for newly diagnosed metastatic adenocarcinoma of the lung who presents for a follow up visit.  # Metastatic Adenocarcinoma of the Lung  -- NGS testing from this  patient shows no evidence of targetable mutation. --MRI brain shows no evidence of intracranial metastases -At this time would pursue carboplatinum, pembrolizumab, and pemetrexed as treatment for his cancer. --Can consider targeted radiation therapy for areas causing pain if persistent after adjustment of pain medications and starting treatment --We will plan to start treatment after port placement, in approximately 1 to 2 weeks  #Supportive Care -- chemotherapy education to be scheduled  -- port placement to be scheduled.  -- zofran 8mg  q8H PRN and compazine 10mg  PO q6H for nausea -- EMLA cream for port -- Patient referred to palliative care for pain control   Orders  Placed This Encounter  Procedures   IR IMAGING GUIDED PORT INSERTION    Standing Status:   Future    Standing Expiration Date:   06/22/2022    Order Specific Question:   Reason for Exam (SYMPTOM  OR DIAGNOSIS REQUIRED)    Answer:   Port required for chemotherapy administration for newly diagnosed metastatic adenocarcinoma of the lung    Order Specific Question:   Preferred Imaging Location?    Answer:   [facility]    All questions were answered. The patient knows to call the clinic with any problems, questions or concerns.  A total of more than 30 minutes were spent on this encounter with face-to-face time and non-face-to-face time, including preparing to see the patient, ordering tests and/or medications, counseling the patient and coordination of care as outlined above.   Ledell Peoples, MD Department of Hematology/Oncology [place] [facility] Phone: [phone] Pager: [phone] Email: [email or web address]  06/21/2021 3:05 PM

## 2021-06-22 ENCOUNTER — Telehealth: Payer: Self-pay | Admitting: *Deleted

## 2021-06-22 NOTE — Telephone Encounter (Signed)
Received vm message from pt regarding one of his new medications. Returned call to patient. Reviewed his new medications.  He picked up his MSContin 15 mg yesterday and Ondansetron.  He states that was all that was given to him.  Advised he should have 1 more medication for nausea. Advised I would call the pharmacy to check on that one (compazine). TCT pt's pharmacy [facility])  and asked about the compazine. They said they were out of stock yesterday and that they would have in this afternoon.  TCT patient and made him aware of when he can pick up the compazine. He voiced understanding. He states he started the MS Contin today and can tell that his pain is not as intense.  He will follow up with Beatriz Chancellor, NP in [place] for his pain control.

## 2021-06-24 ENCOUNTER — Inpatient Hospital Stay (HOSPITAL_BASED_OUTPATIENT_CLINIC_OR_DEPARTMENT_OTHER): Payer: Medicare HMO | Admitting: Nurse Practitioner

## 2021-06-24 ENCOUNTER — Encounter: Payer: Self-pay | Admitting: Nurse Practitioner

## 2021-06-24 DIAGNOSIS — K5903 Drug induced constipation: Secondary | ICD-10-CM | POA: Diagnosis not present

## 2021-06-24 DIAGNOSIS — G893 Neoplasm related pain (acute) (chronic): Secondary | ICD-10-CM

## 2021-06-24 DIAGNOSIS — C61 Malignant neoplasm of prostate: Secondary | ICD-10-CM | POA: Diagnosis not present

## 2021-06-24 DIAGNOSIS — Z515 Encounter for palliative care: Secondary | ICD-10-CM

## 2021-06-24 DIAGNOSIS — C3412 Malignant neoplasm of upper lobe, left bronchus or lung: Secondary | ICD-10-CM

## 2021-06-28 ENCOUNTER — Other Ambulatory Visit: Payer: Self-pay | Admitting: Radiology

## 2021-06-29 ENCOUNTER — Ambulatory Visit (HOSPITAL_COMMUNITY)
Admission: RE | Admit: 2021-06-29 | Discharge: 2021-06-29 | Disposition: A | Discharge status: Home / Self Care | Payer: Medicare HMO | Source: Ambulatory Visit | Attending: Hematology and Oncology | Admitting: Hematology and Oncology

## 2021-06-29 ENCOUNTER — Encounter (HOSPITAL_COMMUNITY): Payer: Self-pay

## 2021-06-29 DIAGNOSIS — C3412 Malignant neoplasm of upper lobe, left bronchus or lung: Secondary | ICD-10-CM | POA: Insufficient documentation

## 2021-06-29 DIAGNOSIS — I10 Essential (primary) hypertension: Secondary | ICD-10-CM | POA: Diagnosis not present

## 2021-06-29 DIAGNOSIS — Z87891 Personal history of nicotine dependence: Secondary | ICD-10-CM | POA: Diagnosis not present

## 2021-06-29 DIAGNOSIS — Z923 Personal history of irradiation: Secondary | ICD-10-CM | POA: Insufficient documentation

## 2021-06-29 DIAGNOSIS — Z8546 Personal history of malignant neoplasm of prostate: Secondary | ICD-10-CM | POA: Diagnosis not present

## 2021-06-29 DIAGNOSIS — C7951 Secondary malignant neoplasm of bone: Secondary | ICD-10-CM | POA: Insufficient documentation

## 2021-06-29 HISTORY — PX: IR IMAGING GUIDED PORT INSERTION: IMG5740

## 2021-06-29 MED ORDER — HEPARIN SOD (PORK) LOCK FLUSH 100 UNIT/ML IV SOLN
INTRAVENOUS | Status: AC
  Filled 2021-06-29: qty 5

## 2021-06-29 MED ORDER — FENTANYL CITRATE (PF) 100 MCG/2ML IJ SOLN
INTRAMUSCULAR | Status: DC | PRN
  Administered 2021-06-29 (×2): 50 ug via INTRAVENOUS

## 2021-06-29 MED ORDER — LIDOCAINE HCL (PF) 1 % IJ SOLN
INTRAMUSCULAR | Status: DC | PRN
  Administered 2021-06-29: 10 mL via INTRADERMAL

## 2021-06-29 MED ORDER — MIDAZOLAM HCL 2 MG/2ML IJ SOLN
INTRAMUSCULAR | Status: DC | PRN
  Administered 2021-06-29 (×2): 1 mg via INTRAVENOUS

## 2021-06-29 MED ORDER — LIDOCAINE HCL 1 % IJ SOLN
INTRAMUSCULAR | Status: AC
  Filled 2021-06-29: qty 20

## 2021-06-29 MED ORDER — HEPARIN SOD (PORK) LOCK FLUSH 100 UNIT/ML IV SOLN
INTRAVENOUS | Status: DC | PRN
  Administered 2021-06-29: 500 [IU] via INTRAVENOUS

## 2021-06-29 MED ORDER — FENTANYL CITRATE (PF) 100 MCG/2ML IJ SOLN
INTRAMUSCULAR | Status: AC
  Filled 2021-06-29: qty 2

## 2021-06-29 MED ORDER — SODIUM CHLORIDE 0.9 % IV SOLN: INTRAVENOUS | Status: DC

## 2021-06-29 MED ORDER — MIDAZOLAM HCL 2 MG/2ML IJ SOLN
INTRAMUSCULAR | Status: AC
  Filled 2021-06-29: qty 2

## 2021-06-29 NOTE — H&P (Signed)
Chief Complaint: Patient was seen in consultation today for port-a-catheter placement.   Referring Physician(s): Dorsey,John T IV  Supervising Physician: Corrie Mckusick  Patient Status: [facility] - Out-pt  History of Present Illness: Terry Page. is a 66 y.o. male with a medical history significant for  HTN, prostate cancer and lung cancer. He has received radiation therapy for both cancers. Surveillance imaging showed numerous bone metastases and a bone biopsy in IR 05/03/21 was positive for metastatic carcinoma consistent with lung primary. His oncology team is preparing him for systemic therapy and have requested port placement in IR.   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 Gery Pray   Incidental lung nodule, greater than or equal to 52mm 10/22/2017   Left upper lobe - discovered on CTA   Prostate cancer [facility])    Spontaneous pneumothorax 10/20/2017   right   Tobacco abuse     Past Surgical History:  Procedure Laterality Date   BACK SURGERY     PROSTATE BIOPSY      Allergies: Patient has no known allergies.  Medications: Prior to Admission medications   Medication Sig Start Date End Date Taking? Authorizing Provider  acetaminophen (TYLENOL) 325 MG tablet Take 2 tablets (650 mg total) by mouth every 6 (six) hours as needed for mild pain or headache (fever >/= 101). 05/10/20   Arrien, Jimmy Picket, MD  albuterol (VENTOLIN HFA) 108 (90 Base) MCG/ACT inhaler Inhale 2 puffs into the lungs every 6 (six) hours as needed for wheezing or shortness of breath. 06/06/19 04/29/22  Elodia Florence., MD  amLODipine (NORVASC) 10 MG tablet Take 1 tablet by mouth daily. 06/14/20   [provider]  atorvastatin (LIPITOR) 20 MG tablet Take 20 mg by mouth daily. 01/28/19   [provider]   folic acid (FOLVITE) 1 MG tablet Take 1 tablet (1 mg total) by mouth daily. 05/08/21   Rai, Vernelle Emerald, MD  hydrOXYzine (ATARAX) 25 MG tablet Take 1 tablet (25 mg total) by mouth at bedtime as needed for itching. 06/20/21   Pickenpack-Cousar, Carlena Sax, NP  ipratropium-albuterol (DUONEB) 0.5-2.5 (3) MG/3ML SOLN Take 3 mLs by nebulization every 6 (six) hours as needed. Patient taking differently: Take 3 mLs by nebulization every 6 (six) hours as needed (shortness of breathing or wheezing). 05/10/20   Arrien, Jimmy Picket, MD  lidocaine (LIDODERM) 5 % Place 1 patch onto the skin daily. Remove & Discard patch within 12 hours or as directed by MD. Apply to low back 05/07/21   Rai, Ripudeep K, MD  lidocaine-prilocaine (EMLA) cream Apply 1 Application topically as needed. 06/21/21   Orson Slick, MD  losartan (COZAAR) 100 MG tablet Take 100 mg by mouth daily. 05/22/21   [provider]  morphine (MS CONTIN) 15 MG 12 hr tablet Take 1 tablet (15 mg total) by mouth every 12 (twelve) hours. 06/20/21   Pickenpack-Cousar, Carlena Sax, NP  olmesartan (BENICAR) 40 MG tablet Take 40 mg by mouth daily. 03/03/21   [provider]  ondansetron (ZOFRAN) 8 MG tablet Take 1 tablet (8 mg total) by mouth every 8 (eight) hours as needed. 06/21/21   Orson Slick, MD  oxyCODONE (ROXICODONE) 5 MG immediate release tablet Take 1 tablet (5 mg total) by mouth  every 6 (six) hours as needed for severe pain. 06/07/21   Orson Slick, MD  polyethylene glycol (MIRALAX) 17 g packet Take 17 g by mouth daily. 06/20/21   Pickenpack-Cousar, Carlena Sax, NP  prochlorperazine (COMPAZINE) 10 MG tablet Take 1 tablet (10 mg total) by mouth every 6 (six) hours as needed for nausea or vomiting. 06/21/21   Orson Slick, MD  senna-docusate (SENNA S) 8.6-50 MG tablet Take 2 tablets by mouth daily. 06/20/21   Pickenpack-Cousar, Carlena Sax, NP  thiamine 100 MG tablet Take 1 tablet (100 mg total) by mouth daily. 05/08/21   Mendel Corning,  MD     Family History  Problem Relation Age of Onset   Cancer Cousin        maternal cousin   Cancer Cousin        paternal cousin   Cancer Cousin    Colon polyps Neg Hx    Pancreatic disease Neg Hx    Pancreatic cancer Neg Hx    Breast cancer Neg Hx    Colon cancer Neg Hx     Social History   Socioeconomic History   Marital status: Single    Spouse name: Not on file   Number of children: Not on file   Years of education: Not on file   Highest education level: Not on file  Occupational History   Occupation: retired  Tobacco Use   Smoking status: Former    Types: Cigarettes    Quit date: 11/22/2019    Years since quitting: 1.6   Smokeless tobacco: Never   Tobacco comments:    Patient reports quit 3 years ago. 06/25/20. [facility]  Vaping Use   Vaping Use: Never used  Substance and Sexual Activity   Alcohol use: Not Currently    Alcohol/week: 12.0 standard drinks of alcohol    Types: 12 Cans of beer per week    Comment: daily   Drug use: No   Sexual activity: Not Currently  Other Topics Concern   Not on file  Social History Narrative   Not on file   Social Determinants of Health   Financial Resource Strain: Not on file  Food Insecurity: Not on file  Transportation Needs: Not on file  Physical Activity: Not on file  Stress: Not on file  Social Connections: Not on file    Review of Systems: A 12 point ROS discussed and pertinent positives are indicated in the HPI above.  All other systems are negative.  Review of Systems  Constitutional:  Positive for fatigue.  Respiratory:  Negative for cough and shortness of breath.   Cardiovascular:  Negative for chest pain and leg swelling.  Gastrointestinal:  Negative for abdominal pain, diarrhea, nausea and vomiting.  Musculoskeletal:  Positive for back pain.  Neurological:  Positive for numbness. Negative for dizziness and headaches.       Lower extremity numbness.     Vital Signs: BP 120/82   Pulse 92   Temp 98.1  F (36.7 C) (Oral)   Resp 14   Ht 5\' 8"  (1.727 m)   Wt 151 lb 14.4 oz (68.9 kg)   BMI 23.10 kg/m   Physical Exam Constitutional:      General: He is not in acute distress.    Appearance: He is not ill-appearing.  HENT:     Mouth/Throat:     Mouth: Mucous membranes are moist.     Pharynx: Oropharynx is clear.  Cardiovascular:  Rate and Rhythm: Normal rate and regular rhythm.     Pulses: Normal pulses.     Heart sounds: Normal heart sounds.  Pulmonary:     Effort: Pulmonary effort is normal.     Breath sounds: Normal breath sounds.  Abdominal:     General: Bowel sounds are normal.     Palpations: Abdomen is soft.     Tenderness: There is no abdominal tenderness.  Musculoskeletal:     Right lower leg: No edema.     Left lower leg: No edema.  Skin:    General: Skin is warm and dry.  Neurological:     Mental Status: He is alert and oriented to person, place, and time.     Imaging: No results found.  Labs:  CBC: Recent Labs    04/28/21 0043 04/28/21 0742 04/29/21 0114 06/20/21 1245  WBC 10.4 11.0* 7.4 11.6*  HGB 12.4* 11.8* 11.2* 11.0*  HCT 36.9* 33.8* 32.4* 32.4*  PLT 371 367 315 475*    COAGS: Recent Labs    05/03/21 0606  INR 1.1    BMP: Recent Labs    05/03/21 0606 05/04/21 0507 05/05/21 0534 05/06/21 0542 06/20/21 1245  NA 123* 127*  --  128* 134*  K 3.8 3.9  --  3.8 3.4*  CL 92* 100  --  99 99  CO2 22 20*  --  21* 26  GLUCOSE 109* 110*  --  109* 110*  BUN 39* 29*  --  8 7*  CALCIUM 8.5* 8.3*  --  8.4* 9.8  CREATININE 1.89* 1.08 0.84 0.64 0.68  GFRNONAA 38* >60 >60 >60 >60    LIVER FUNCTION TESTS: Recent Labs    04/28/21 0043 04/28/21 0742 04/29/21 0114 04/29/21 0654 05/01/21 0526 06/20/21 1245  BILITOT 1.2  --   --   --  1.0 0.5  AST 117*  --   --   --  42* 16  ALT 74*  --   --   --  38 11  ALKPHOS 72  --   --   --  67 107  PROT 9.5*  --   --   --  7.8 8.6*  ALBUMIN 4.7   < > 3.7 3.8 3.7 4.1   < > = values in this  interval not displayed.    TUMOR MARKERS: No results for input(s): "AFPTM", "CEA", "CA199", "CHROMGRNA" in the last 8760 hours.  Assessment and Plan:  Metastatic lung cancer; pending systemic therapy: Terry Page. Terry Pizza., 66 year old male, presents today to the [facility] department for an image-guided port-a-catheter placement.  Risks and benefits of image-guided Port-a-catheter placement were discussed with the patient including, but not limited to bleeding, infection, pneumothorax, or fibrin sheath development and need for additional procedures.  All of the patient's questions were answered, patient is agreeable to proceed. He has been NPO.   Consent signed and in chart.  Thank you for this interesting consult.  I greatly enjoyed meeting Terry Page. and look forward to participating in their care.  A copy of this report was sent to the requesting provider on this date.  Electronically Signed: Soyla Dryer, AGACNP-BC [phone] 06/29/2021, 12:47 PM   I spent a total of  30 Minutes   in face to face in clinical consultation, greater than 50% of which was counseling/coordinating care for port-a-catheter placement.

## 2021-06-29 NOTE — Procedures (Signed)
Interventional Radiology Procedure Note  Procedure: Placement of a right IJ approach single lumen PowerPort.  Tip is positioned at the superior cavoatrial junction and catheter is ready for immediate use.  Complications: None Recommendations:  - Ok to shower tomorrow - Do not submerge for 7 days - Routine line care   Signed,  Gema Ringold S. Seleny Allbright, DO   

## 2021-06-29 NOTE — Discharge Instructions (Addendum)
Implanted Port Insertion, Care After The following information offers guidance on how to care for yourself after your procedure. Your health care provider may also give you more specific instructions. If you have problems or questions, contact your health care provider.  Urgent needs- Interventional Radiology clinic- [phone] Wound- May remove dressing in 24-48 hours and shower. Otherwise keep site clean and dry. May replace dressing with clean bandaids as needed.  Your Provider should set up monthly appointments for Port flush.  What can I expect after the procedure? After the procedure, it is common to have: Discomfort at the port insertion site. Bruising on the skin over the port. This should improve over 3-4 days. Follow these instructions at home: [facility] care After your port is placed, you will get a manufacturer's information card. The card has information about your port. Keep this card with you at all times. Take care of the port as told by your health care provider. Ask your health care provider if you or a family member can get training for taking care of the port at home. A home health care nurse will be be available to help care for the port. Make sure to remember what type of port you have. Incision care     Follow instructions from your health care provider about how to take care of your port insertion site. Make sure you: Wash your hands with soap and water for at least 20 seconds before and after you change your bandage (dressing). If soap and water are not available, use hand sanitizer. Change your dressing as told by your health care provider. Leave stitches (sutures), skin glue, or adhesive strips in place. These skin closures may need to stay in place for 2 weeks or longer. If adhesive strip edges start to loosen and curl up, you may trim the loose edges. Do not remove adhesive strips completely unless your health care provider tells you to do that. Check your port  insertion site every day for signs of infection. Check for: Redness, swelling, or pain. Fluid or blood. Warmth. Pus or a bad smell. Activity Return to your normal activities as told by your health care provider. Ask your health care provider what activities are safe for you. You may have to avoid lifting. Ask your health care provider how much you can safely lift. General instructions Take over-the-counter and prescription medicines only as told by your health care provider. Do not take baths, swim, or use a hot tub until site healed. Ask your health care provider if you may take showers. You may only be allowed to take sponge baths. If you were given a sedative during the procedure, it can affect you for several hours. Do not drive or operate machinery until your health care provider says that it is safe. Wear a medical alert bracelet in case of an emergency. This will tell any health care providers that you have a port. Keep all follow-up visits. This is important. Contact a health care provider if: You cannot flush your port with saline as directed, or you cannot draw blood from the port. You have a fever or chills. You have redness, swelling, or pain around your port insertion site. You have fluid or blood coming from your port insertion site. Your port insertion site feels warm to the touch. You have pus or a bad smell coming from the port insertion site. Get help right away if: You have chest pain or shortness of breath. You have bleeding from your port  that you cannot control. These symptoms may be an emergency. Get help right away. Call 911. Do not wait to see if the symptoms will go away. Do not drive yourself to the hospital. Summary Take care of the port as told by your health care provider. Keep the manufacturer's information card with you at all times. Change your dressing as told by your health care provider. Contact a health care provider if you have a fever or chills or  if you have redness, swelling, or pain around your port insertion site. Keep all follow-up visits. This information is not intended to replace advice given to you by your health care provider. Make sure you discuss any questions you have with your health care provider. Document Revised: 06/22/2020 Document Reviewed: 06/22/2020 Elsevier Patient Education  [place].

## 2021-06-30 NOTE — Progress Notes (Signed)
Pharmacist Chemotherapy Monitoring - Initial Assessment    Anticipated start date: 07/08/21   The following has been reviewed per standard work regarding the patient's treatment regimen: The patient's diagnosis, treatment plan and drug doses, and organ/hematologic function Lab orders and baseline tests specific to treatment regimen  The treatment plan start date, drug sequencing, and pre-medications Prior authorization status  Patient's documented medication list, including drug-drug interaction screen and prescriptions for anti-emetics and supportive care specific to the treatment regimen The drug concentrations, fluid compatibility, administration routes, and timing of the medications to be used The patient's access for treatment and lifetime cumulative dose history, if applicable  The patient's medication allergies and previous infusion related reactions, if applicable   Changes made to treatment plan:  N/A  Follow up needed:  adding pre-medications - Pt needs B12 inj prior to Pemetrexed start.  Notified MD and desk RN.   Kennith Center, Pharm.D., CPP 06/30/2021@12 :49 PM

## 2021-07-04 ENCOUNTER — Inpatient Hospital Stay: Payer: Medicare HMO

## 2021-07-04 ENCOUNTER — Inpatient Hospital Stay: Payer: Medicare HMO | Attending: Hematology and Oncology

## 2021-07-04 ENCOUNTER — Other Ambulatory Visit: Payer: Self-pay | Admitting: *Deleted

## 2021-07-04 ENCOUNTER — Other Ambulatory Visit: Payer: Self-pay

## 2021-07-04 DIAGNOSIS — Z5111 Encounter for antineoplastic chemotherapy: Secondary | ICD-10-CM | POA: Diagnosis present

## 2021-07-04 DIAGNOSIS — Z87891 Personal history of nicotine dependence: Secondary | ICD-10-CM | POA: Insufficient documentation

## 2021-07-04 DIAGNOSIS — C3412 Malignant neoplasm of upper lobe, left bronchus or lung: Secondary | ICD-10-CM

## 2021-07-04 DIAGNOSIS — F1021 Alcohol dependence, in remission: Secondary | ICD-10-CM | POA: Diagnosis not present

## 2021-07-04 DIAGNOSIS — G893 Neoplasm related pain (acute) (chronic): Secondary | ICD-10-CM | POA: Diagnosis not present

## 2021-07-04 DIAGNOSIS — K5900 Constipation, unspecified: Secondary | ICD-10-CM | POA: Diagnosis not present

## 2021-07-04 DIAGNOSIS — I10 Essential (primary) hypertension: Secondary | ICD-10-CM | POA: Diagnosis not present

## 2021-07-04 DIAGNOSIS — Z5112 Encounter for antineoplastic immunotherapy: Secondary | ICD-10-CM | POA: Insufficient documentation

## 2021-07-04 DIAGNOSIS — C61 Malignant neoplasm of prostate: Secondary | ICD-10-CM

## 2021-07-04 DIAGNOSIS — Z923 Personal history of irradiation: Secondary | ICD-10-CM | POA: Insufficient documentation

## 2021-07-04 DIAGNOSIS — E538 Deficiency of other specified B group vitamins: Secondary | ICD-10-CM | POA: Diagnosis not present

## 2021-07-04 DIAGNOSIS — Z515 Encounter for palliative care: Secondary | ICD-10-CM | POA: Insufficient documentation

## 2021-07-04 DIAGNOSIS — Z8546 Personal history of malignant neoplasm of prostate: Secondary | ICD-10-CM | POA: Diagnosis not present

## 2021-07-04 DIAGNOSIS — Z79899 Other long term (current) drug therapy: Secondary | ICD-10-CM | POA: Insufficient documentation

## 2021-07-04 MED ORDER — CYANOCOBALAMIN 1000 MCG/ML IJ SOLN
1000.0000 ug | Freq: Once | INTRAMUSCULAR | Status: AC
  Administered 2021-07-04: 1000 ug via INTRAMUSCULAR
  Filled 2021-07-04: qty 1

## 2021-07-06 ENCOUNTER — Other Ambulatory Visit: Payer: Self-pay | Admitting: *Deleted

## 2021-07-06 DIAGNOSIS — C3412 Malignant neoplasm of upper lobe, left bronchus or lung: Secondary | ICD-10-CM

## 2021-07-07 MED FILL — Dexamethasone Sodium Phosphate Inj 100 MG/10ML: INTRAMUSCULAR | Qty: 1 | Status: AC

## 2021-07-07 MED FILL — Fosaprepitant Dimeglumine For IV Infusion 150 MG (Base Eq): INTRAVENOUS | Qty: 5 | Status: AC

## 2021-07-08 ENCOUNTER — Other Ambulatory Visit: Payer: Self-pay

## 2021-07-08 ENCOUNTER — Inpatient Hospital Stay: Payer: Medicare HMO

## 2021-07-08 ENCOUNTER — Inpatient Hospital Stay (HOSPITAL_BASED_OUTPATIENT_CLINIC_OR_DEPARTMENT_OTHER): Payer: Medicare HMO | Admitting: Nurse Practitioner

## 2021-07-08 ENCOUNTER — Encounter: Payer: Self-pay | Admitting: Nurse Practitioner

## 2021-07-08 ENCOUNTER — Inpatient Hospital Stay: Payer: Medicare HMO | Admitting: Hematology and Oncology

## 2021-07-08 ENCOUNTER — Other Ambulatory Visit: Payer: Self-pay | Admitting: Hematology and Oncology

## 2021-07-08 ENCOUNTER — Other Ambulatory Visit: Payer: Self-pay | Admitting: *Deleted

## 2021-07-08 ENCOUNTER — Inpatient Hospital Stay (HOSPITAL_BASED_OUTPATIENT_CLINIC_OR_DEPARTMENT_OTHER): Payer: Medicare HMO

## 2021-07-08 VITALS — BP 158/89 | HR 89 | Temp 97.9°F | Resp 16 | Wt 152.0 lb

## 2021-07-08 DIAGNOSIS — G893 Neoplasm related pain (acute) (chronic): Secondary | ICD-10-CM

## 2021-07-08 DIAGNOSIS — K5903 Drug induced constipation: Secondary | ICD-10-CM | POA: Diagnosis not present

## 2021-07-08 DIAGNOSIS — Z5112 Encounter for antineoplastic immunotherapy: Secondary | ICD-10-CM | POA: Diagnosis not present

## 2021-07-08 DIAGNOSIS — C3412 Malignant neoplasm of upper lobe, left bronchus or lung: Secondary | ICD-10-CM

## 2021-07-08 DIAGNOSIS — C61 Malignant neoplasm of prostate: Secondary | ICD-10-CM

## 2021-07-08 DIAGNOSIS — Z515 Encounter for palliative care: Secondary | ICD-10-CM

## 2021-07-08 DIAGNOSIS — Z95828 Presence of other vascular implants and grafts: Secondary | ICD-10-CM

## 2021-07-08 LAB — CMP (CANCER CENTER ONLY)
ALT: 7 U/L (ref 0–44)
AST: 13 U/L — ABNORMAL LOW (ref 15–41)
Albumin: 4 g/dL (ref 3.5–5.0)
Alkaline Phosphatase: 98 U/L (ref 38–126)
Anion gap: 6 (ref 5–15)
BUN: 8 mg/dL (ref 8–23)
CO2: 29 mmol/L (ref 22–32)
Calcium: 9.8 mg/dL (ref 8.9–10.3)
Chloride: 97 mmol/L — ABNORMAL LOW (ref 98–111)
Creatinine: 0.59 mg/dL — ABNORMAL LOW (ref 0.61–1.24)
GFR, Estimated: >60 mL/min (ref >60)
Glucose, Bld: 110 mg/dL — ABNORMAL HIGH (ref 70–99)
Potassium: 3.2 mmol/L — ABNORMAL LOW (ref 3.5–5.1)
Sodium: 132 mmol/L — ABNORMAL LOW (ref 135–145)
Total Bilirubin: 0.4 mg/dL (ref 0.3–1.2)
Total Protein: 8.6 g/dL — ABNORMAL HIGH (ref 6.5–8.1)

## 2021-07-08 LAB — CBC WITH DIFFERENTIAL (CANCER CENTER ONLY)
Abs Immature Granulocytes: 0.06 10*3/uL (ref 0.00–0.07)
Basophils Absolute: 0.1 10*3/uL (ref 0.0–0.1)
Basophils Relative: 0 %
Eosinophils Absolute: 0.6 10*3/uL — ABNORMAL HIGH (ref 0.0–0.5)
Eosinophils Relative: 4 %
HCT: 31.5 % — ABNORMAL LOW (ref 39.0–52.0)
Hemoglobin: 10.6 g/dL — ABNORMAL LOW (ref 13.0–17.0)
Immature Granulocytes: 0 %
Lymphocytes Relative: 12 %
Lymphs Abs: 1.8 10*3/uL (ref 0.7–4.0)
MCH: 28.7 pg (ref 26.0–34.0)
MCHC: 33.7 g/dL (ref 30.0–36.0)
MCV: 85.4 fL (ref 80.0–100.0)
Monocytes Absolute: 1.1 10*3/uL — ABNORMAL HIGH (ref 0.1–1.0)
Monocytes Relative: 8 %
Neutro Abs: 10.7 10*3/uL — ABNORMAL HIGH (ref 1.7–7.7)
Neutrophils Relative %: 76 %
Platelet Count: 453 10*3/uL — ABNORMAL HIGH (ref 150–400)
RBC: 3.69 MIL/uL — ABNORMAL LOW (ref 4.22–5.81)
RDW: 14.7 % (ref 11.5–15.5)
WBC Count: 14.3 10*3/uL — ABNORMAL HIGH (ref 4.0–10.5)
nRBC: 0 % (ref 0.0–0.2)

## 2021-07-08 LAB — TSH: TSH: 1.438 u[IU]/mL (ref 0.350–4.500)

## 2021-07-08 MED ORDER — SODIUM CHLORIDE 0.9% FLUSH
10.0000 mL | INTRAVENOUS | Status: DC | PRN
  Administered 2021-07-08: 10 mL

## 2021-07-08 MED ORDER — PROCHLORPERAZINE MALEATE 10 MG PO TABS: 10.0000 mg | ORAL_TABLET | Freq: Four times a day (QID) | ORAL | 0 refills | Status: DC | PRN

## 2021-07-08 MED ORDER — SODIUM CHLORIDE 0.9 % IV SOLN
10.0000 mg | Freq: Once | INTRAVENOUS | Status: AC
  Administered 2021-07-08: 10 mg via INTRAVENOUS
  Filled 2021-07-08: qty 10

## 2021-07-08 MED ORDER — SODIUM CHLORIDE 0.9 % IV SOLN
500.0000 mg/m2 | Freq: Once | INTRAVENOUS | Status: AC
  Administered 2021-07-08: 900 mg via INTRAVENOUS
  Filled 2021-07-08: qty 20

## 2021-07-08 MED ORDER — HEPARIN SOD (PORK) LOCK FLUSH 100 UNIT/ML IV SOLN
500.0000 [IU] | Freq: Once | INTRAVENOUS | Status: AC | PRN
  Administered 2021-07-08: 500 [IU]

## 2021-07-08 MED ORDER — PALONOSETRON HCL INJECTION 0.25 MG/5ML
0.2500 mg | Freq: Once | INTRAVENOUS | Status: AC
  Administered 2021-07-08: 0.25 mg via INTRAVENOUS
  Filled 2021-07-08: qty 5

## 2021-07-08 MED ORDER — SODIUM CHLORIDE 0.9 % IV SOLN
150.0000 mg | Freq: Once | INTRAVENOUS | Status: AC
  Administered 2021-07-08: 150 mg via INTRAVENOUS
  Filled 2021-07-08: qty 150

## 2021-07-08 MED ORDER — POTASSIUM CHLORIDE CRYS ER 20 MEQ PO TBCR: 20.0000 meq | EXTENDED_RELEASE_TABLET | Freq: Every day | ORAL | 0 refills | Status: DC

## 2021-07-08 MED ORDER — SODIUM CHLORIDE 0.9% FLUSH
10.0000 mL | Freq: Once | INTRAVENOUS | Status: AC
  Administered 2021-07-08: 10 mL

## 2021-07-08 MED ORDER — SODIUM CHLORIDE 0.9 % IV SOLN
470.0000 mg | Freq: Once | INTRAVENOUS | Status: AC
  Administered 2021-07-08: 470 mg via INTRAVENOUS
  Filled 2021-07-08: qty 47

## 2021-07-08 MED ORDER — SODIUM CHLORIDE 0.9 % IV SOLN
200.0000 mg | Freq: Once | INTRAVENOUS | Status: AC
  Administered 2021-07-08: 200 mg via INTRAVENOUS
  Filled 2021-07-08: qty 200

## 2021-07-08 MED ORDER — SODIUM CHLORIDE 0.9 % IV SOLN: Freq: Once | INTRAVENOUS | Status: AC

## 2021-07-08 NOTE — Patient Instructions (Signed)
[facility]  Discharge Instructions: Thank you for choosing [facility] to provide your oncology and hematology care.   If you have a lab appointment with the [facility], please go directly to the [facility] and check in at the registration area.   Wear comfortable clothing and clothing appropriate for easy access to any Portacath or PICC line.   We strive to give you quality time with your provider. You may need to reschedule your appointment if you arrive late (15 or more minutes).  Arriving late affects you and other patients whose appointments are after yours.  Also, if you miss three or more appointments without notifying the office, you may be dismissed from the clinic at the provider's discretion.      For prescription refill requests, have your pharmacy contact our office and allow 72 hours for refills to be completed.    Today you received the following chemotherapy and/or immunotherapy agents Keytruda, Alimta, Carboplatin      To help prevent nausea and vomiting after your treatment, we encourage you to take your nausea medication as directed.  BELOW ARE SYMPTOMS THAT SHOULD BE REPORTED IMMEDIATELY: *FEVER GREATER THAN 100.4 F (38 C) OR HIGHER *CHILLS OR SWEATING *NAUSEA AND VOMITING THAT IS NOT CONTROLLED WITH YOUR NAUSEA MEDICATION *UNUSUAL SHORTNESS OF BREATH *UNUSUAL BRUISING OR BLEEDING *URINARY PROBLEMS (pain or burning when urinating, or frequent urination) *BOWEL PROBLEMS (unusual diarrhea, constipation, pain near the anus) TENDERNESS IN MOUTH AND THROAT WITH OR WITHOUT PRESENCE OF ULCERS (sore throat, sores in mouth, or a toothache) UNUSUAL RASH, SWELLING OR PAIN  UNUSUAL VAGINAL DISCHARGE OR ITCHING   Items with * indicate a potential emergency and should be followed up as soon as possible or go to the Emergency Department if any problems should occur.  Please show the CHEMOTHERAPY ALERT CARD or IMMUNOTHERAPY ALERT  CARD at check-in to the Emergency Department and triage nurse.  Should you have questions after your visit or need to cancel or reschedule your appointment, please contact [facility]  Dept: [phone]  and follow the prompts.  Office hours are 8:00 a.m. to 4:30 p.m. Monday - Friday. Please note that voicemails left after 4:00 p.m. may not be returned until the following business day.  We are closed weekends and major holidays. You have access to a nurse at all times for urgent questions. Please call the main number to the clinic Dept: [phone] and follow the prompts.   For any non-urgent questions, you may also contact your provider using MyChart. We now offer e-Visits for anyone 68 and older to request care online for non-urgent symptoms. For details visit [email or web address].   Also download the MyChart app! Go to the app store, search "MyChart", open the app, select [facility], and log in with your MyChart username and password.  Masks are optional in the cancer centers. If you would like for your care team to wear a mask while they are taking care of you, please let them know. For doctor visits, patients may have with them one support person who is at least 68 years old. At this time, visitors are not allowed in the infusion area.  Pembrolizumab injection What is this medication? PEMBROLIZUMAB (pem broe liz ue mab) is a monoclonal antibody. It is used to treat certain types of cancer. This medicine may be used for other purposes; ask your health care provider or pharmacist if you have questions. COMMON BRAND NAME(S): [employer]  What should I tell my care team before I take this medication? They need to know if you have any of these conditions: autoimmune diseases like Crohn's disease, ulcerative colitis, or lupus have had or planning to have an allogeneic stem cell transplant (uses someone else's stem cells) history of organ transplant history of chest  radiation nervous system problems like myasthenia gravis or Guillain-Barre syndrome an unusual or allergic reaction to pembrolizumab, other medicines, foods, dyes, or preservatives pregnant or trying to get pregnant breast-feeding How should I use this medication? This medicine is for infusion into a vein. It is given by a health care professional in a hospital or clinic setting. A special MedGuide will be given to you before each treatment. Be sure to read this information carefully each time. Talk to your pediatrician regarding the use of this medicine in children. While this drug may be prescribed for children as young as 6 months for selected conditions, precautions do apply. Overdosage: If you think you have taken too much of this medicine contact a poison control center or emergency room at once. NOTE: This medicine is only for you. Do not share this medicine with others. What if I miss a dose? It is important not to miss your dose. Call your doctor or health care professional if you are unable to keep an appointment. What may interact with this medication? Interactions have not been studied. This list may not describe all possible interactions. Give your health care provider a list of all the medicines, herbs, non-prescription drugs, or dietary supplements you use. Also tell them if you smoke, drink alcohol, or use illegal drugs. Some items may interact with your medicine. What should I watch for while using this medication? Your condition will be monitored carefully while you are receiving this medicine. You may need blood work done while you are taking this medicine. Do not become pregnant while taking this medicine or for 4 months after stopping it. Women should inform their doctor if they wish to become pregnant or think they might be pregnant. There is a potential for serious side effects to an unborn child. Talk to your health care professional or pharmacist for more information. Do  not breast-feed an infant while taking this medicine or for 4 months after the last dose. What side effects may I notice from receiving this medication? Side effects that you should report to your doctor or health care professional as soon as possible: allergic reactions like skin rash, itching or hives, swelling of the face, lips, or tongue bloody or black, tarry breathing problems changes in vision chest pain chills confusion constipation cough diarrhea dizziness or feeling faint or lightheaded fast or irregular heartbeat fever flushing joint pain low blood counts - this medicine may decrease the number of white blood cells, red blood cells and platelets. You may be at increased risk for infections and bleeding. muscle pain muscle weakness pain, tingling, numbness in the hands or feet persistent headache redness, blistering, peeling or loosening of the skin, including inside the mouth signs and symptoms of high blood sugar such as dizziness; dry mouth; dry skin; fruity breath; nausea; stomach pain; increased hunger or thirst; increased urination signs and symptoms of kidney injury like trouble passing urine or change in the amount of urine signs and symptoms of liver injury like dark urine, light-colored stools, loss of appetite, nausea, right upper belly pain, yellowing of the eyes or skin sweating swollen lymph nodes weight loss Side effects that usually do not require  medical attention (report to your doctor or health care professional if they continue or are bothersome): decreased appetite hair loss tiredness This list may not describe all possible side effects. Call your doctor for medical advice about side effects. You may report side effects to FDA at 1-800-FDA-1088. Where should I keep my medication? This drug is given in a hospital or clinic and will not be stored at home. NOTE: This sheet is a summary. It may not cover all possible information. If you have questions  about this medicine, talk to your doctor, pharmacist, or health care provider.  2023 Elsevier/Gold Standard (2020-11-19 00:00:00)  Pemetrexed injection What is this medication? PEMETREXED (PEM e TREX ed) is a chemotherapy drug used to treat lung cancers like non-small cell lung cancer and mesothelioma. It may also be used to treat other cancers. This medicine may be used for other purposes; ask your health care provider or pharmacist if you have questions. COMMON BRAND NAME(S): Alimta, PEMFEXY What should I tell my care team before I take this medication? They need to know if you have any of these conditions: infection (especially a virus infection such as chickenpox, cold sores, or herpes) kidney disease low blood counts, like low white cell, platelet, or red cell counts lung or breathing disease, like asthma radiation therapy an unusual or allergic reaction to pemetrexed, other medicines, foods, dyes, or preservative pregnant or trying to get pregnant breast-feeding How should I use this medication? This drug is given as an infusion into a vein. It is administered in a hospital or clinic by a specially trained health care professional. Talk to your pediatrician regarding the use of this medicine in children. Special care may be needed. Overdosage: If you think you have taken too much of this medicine contact a poison control center or emergency room at once. NOTE: This medicine is only for you. Do not share this medicine with others. What if I miss a dose? It is important not to miss your dose. Call your doctor or health care professional if you are unable to keep an appointment. What may interact with this medication? This medicine may interact with the following medications: Ibuprofen This list may not describe all possible interactions. Give your health care provider a list of all the medicines, herbs, non-prescription drugs, or dietary supplements you use. Also tell them if you  smoke, drink alcohol, or use illegal drugs. Some items may interact with your medicine. What should I watch for while using this medication? Visit your doctor for checks on your progress. This drug may make you feel generally unwell. This is not uncommon, as chemotherapy can affect healthy cells as well as cancer cells. Report any side effects. Continue your course of treatment even though you feel ill unless your doctor tells you to stop. In some cases, you may be given additional medicines to help with side effects. Follow all directions for their use. Call your doctor or health care professional for advice if you get a fever, chills or sore throat, or other symptoms of a cold or flu. Do not treat yourself. This drug decreases your body's ability to fight infections. Try to avoid being around people who are sick. This medicine may increase your risk to bruise or bleed. Call your doctor or health care professional if you notice any unusual bleeding. Be careful brushing and flossing your teeth or using a toothpick because you may get an infection or bleed more easily. If you have any dental work done, tell  your dentist you are receiving this medicine. Avoid taking products that contain aspirin, acetaminophen, ibuprofen, naproxen, or ketoprofen unless instructed by your doctor. These medicines may hide a fever. Call your doctor or health care professional if you get diarrhea or mouth sores. Do not treat yourself. To protect your kidneys, drink water or other fluids as directed while you are taking this medicine. Do not become pregnant while taking this medicine or for 6 months after stopping it. Women should inform their doctor if they wish to become pregnant or think they might be pregnant. Men should not father a child while taking this medicine and for 3 months after stopping it. This may interfere with the ability to father a child. You should talk to your doctor or health care professional if you are  concerned about your fertility. There is a potential for serious side effects to an unborn child. Talk to your health care professional or pharmacist for more information. Do not breast-feed an infant while taking this medicine or for 1 week after stopping it. What side effects may I notice from receiving this medication? Side effects that you should report to your doctor or health care professional as soon as possible: allergic reactions like skin rash, itching or hives, swelling of the face, lips, or tongue breathing problems redness, blistering, peeling or loosening of the skin, including inside the mouth signs and symptoms of bleeding such as bloody or black, tarry stools; red or dark-brown urine; spitting up blood or brown material that looks like coffee grounds; red spots on the skin; unusual bruising or bleeding from the eye, gums, or nose signs and symptoms of infection like fever or chills; cough; sore throat; pain or trouble passing urine signs and symptoms of kidney injury like trouble passing urine or change in the amount of urine signs and symptoms of liver injury like dark yellow or brown urine; general ill feeling or flu-like symptoms; light-colored stools; loss of appetite; nausea; right upper belly pain; unusually weak or tired; yellowing of the eyes or skin Side effects that usually do not require medical attention (report to your doctor or health care professional if they continue or are bothersome): constipation mouth sores nausea, vomiting unusually weak or tired This list may not describe all possible side effects. Call your doctor for medical advice about side effects. You may report side effects to FDA at 1-800-FDA-1088. Where should I keep my medication? This drug is given in a hospital or clinic and will not be stored at home. NOTE: This sheet is a summary. It may not cover all possible information. If you have questions about this medicine, talk to your doctor,  pharmacist, or health care provider.  2023 Elsevier/Gold Standard (2017-02-13 00:00:00)Carboplatin injection What is this medication? CARBOPLATIN (KAR boe pla tin) is a chemotherapy drug. It targets fast dividing cells, like cancer cells, and causes these cells to die. This medicine is used to treat ovarian cancer and many other cancers. This medicine may be used for other purposes; ask your health care provider or pharmacist if you have questions. COMMON BRAND NAME(S): Paraplatin What should I tell my care team before I take this medication? They need to know if you have any of these conditions: blood disorders hearing problems kidney disease recent or ongoing radiation therapy an unusual or allergic reaction to carboplatin, cisplatin, other chemotherapy, other medicines, foods, dyes, or preservatives pregnant or trying to get pregnant breast-feeding How should I use this medication? This drug is usually given as an  infusion into a vein. It is administered in a hospital or clinic by a specially trained health care professional. Talk to your pediatrician regarding the use of this medicine in children. Special care may be needed. Overdosage: If you think you have taken too much of this medicine contact a poison control center or emergency room at once. NOTE: This medicine is only for you. Do not share this medicine with others. What if I miss a dose? It is important not to miss a dose. Call your doctor or health care professional if you are unable to keep an appointment. What may interact with this medication? medicines for seizures medicines to increase blood counts like filgrastim, pegfilgrastim, sargramostim some antibiotics like amikacin, gentamicin, neomycin, streptomycin, tobramycin vaccines Talk to your doctor or health care professional before taking any of these medicines: acetaminophen aspirin ibuprofen ketoprofen naproxen This list may not describe all possible  interactions. Give your health care provider a list of all the medicines, herbs, non-prescription drugs, or dietary supplements you use. Also tell them if you smoke, drink alcohol, or use illegal drugs. Some items may interact with your medicine. What should I watch for while using this medication? Your condition will be monitored carefully while you are receiving this medicine. You will need important blood work done while you are taking this medicine. This drug may make you feel generally unwell. This is not uncommon, as chemotherapy can affect healthy cells as well as cancer cells. Report any side effects. Continue your course of treatment even though you feel ill unless your doctor tells you to stop. In some cases, you may be given additional medicines to help with side effects. Follow all directions for their use. Call your doctor or health care professional for advice if you get a fever, chills or sore throat, or other symptoms of a cold or flu. Do not treat yourself. This drug decreases your body's ability to fight infections. Try to avoid being around people who are sick. This medicine may increase your risk to bruise or bleed. Call your doctor or health care professional if you notice any unusual bleeding. Be careful brushing and flossing your teeth or using a toothpick because you may get an infection or bleed more easily. If you have any dental work done, tell your dentist you are receiving this medicine. Avoid taking products that contain aspirin, acetaminophen, ibuprofen, naproxen, or ketoprofen unless instructed by your doctor. These medicines may hide a fever. Do not become pregnant while taking this medicine. Women should inform their doctor if they wish to become pregnant or think they might be pregnant. There is a potential for serious side effects to an unborn child. Talk to your health care professional or pharmacist for more information. Do not breast-feed an infant while taking this  medicine. What side effects may I notice from receiving this medication? Side effects that you should report to your doctor or health care professional as soon as possible: allergic reactions like skin rash, itching or hives, swelling of the face, lips, or tongue signs of infection - fever or chills, cough, sore throat, pain or difficulty passing urine signs of decreased platelets or bleeding - bruising, pinpoint red spots on the skin, black, tarry stools, nosebleeds signs of decreased red blood cells - unusually weak or tired, fainting spells, lightheadedness breathing problems changes in hearing changes in vision chest pain high blood pressure low blood counts - This drug may decrease the number of white blood cells, red blood cells and platelets.  You may be at increased risk for infections and bleeding. nausea and vomiting pain, swelling, redness or irritation at the injection site pain, tingling, numbness in the hands or feet problems with balance, talking, walking trouble passing urine or change in the amount of urine Side effects that usually do not require medical attention (report to your doctor or health care professional if they continue or are bothersome): hair loss loss of appetite metallic taste in the mouth or changes in taste This list may not describe all possible side effects. Call your doctor for medical advice about side effects. You may report side effects to FDA at 1-800-FDA-1088. Where should I keep my medication? This drug is given in a hospital or clinic and will not be stored at home. NOTE: This sheet is a summary. It may not cover all possible information. If you have questions about this medicine, talk to your doctor, pharmacist, or health care provider.  2023 Elsevier/Gold Standard (2007-05-29 00:00:00)

## 2021-07-08 NOTE — Progress Notes (Signed)
[place] [phone]   Fax:(336) [phone]  PROGRESS NOTE  Patient Care Team: [name], MD as PCP - General (Family Medicine)  Hematological/Oncological History # Metastatic Adenocarcinoma of the Lung  01/07/2020 : CT of the chest with contrast performed which showed interval enlargement of a spiculated nodule in the central left upper lobe measuring 1.2 x 1.0 cm and highly suspicious for primary lung malignancy 02/06/2020 :PET scan performed which showed a hypermetabolic irregular solid 1.3 cm left upper lobe pulmonary nodule compatible with malignancy without any other hypermetabolic lesions 1/0/1751- 04/19/2020:  SBRT to the lung lesion 04/28/2021: CT C/A/P showed multifocal lytic bone metastases are identified. Lesion within the spine of the left scapula and lesions involving bilateral iliac bones and left sacral wing. Establish care with Dr. Lorenso Courier  05/03/2021: biopsy of right iliac lytic lesion showed metastatic carcinoma, consistent with lung primary.  07/08/2021: Cycle 1 Day 1 of Carbo/Pem/Pem  #Adenocarcinoma of the Prostate, [id number] 08/19/2019-10/07/2019: 70 Gy in 28 fractions of 2.5 Gy.  Radiation to the prostate was under the care of Dr. Tyler Pita  Interval History:  Terry Page. 66 y.o. male with medical history significant for newly diagnosed metastatic adenocarcinoma of the lung who presents for a follow up visit. The patient's last visit was on 06/20/2021. In the interim since the last visit he has had port placement and chemotherapy education.   On exam today Mr. Touch notes his port is in place and he is not having any difficulty with the placement.  He notes he is not in any pain.  He reports that chemotherapy education was quite informative and he is willing and able to proceed with treatment at this time.  He notes that he has his nausea medications on hand with the Compazine and Zofran.  He reports that his pain levels are lower than  before and he is not taking as much of the oxycodone.  He notes his pain is currently a 6 out of 10 today.  His energy is good and his appetite is strong.  Overall he is willing and able to proceed with chemotherapy treatment at this time.  He notes he is not having any issues with nausea, vomiting, or diarrhea.  He denies any fevers, chills, sweats.  A full 10 point ROS is listed below.  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 Gery Pray   Incidental lung nodule, greater than or equal to 65mm 10/22/2017   Left upper lobe - discovered on CTA   Prostate cancer ([place])    Spontaneous pneumothorax 10/20/2017   right   Tobacco abuse     SURGICAL HISTORY: Past Surgical History:  Procedure Laterality Date   BACK SURGERY     IR IMAGING GUIDED PORT INSERTION  06/29/2021   PROSTATE BIOPSY      SOCIAL HISTORY: Social History   Socioeconomic History   Marital status: Single    Spouse name: Not on file   Number of children: Not on file   Years of education: Not on file   Highest education level: Not on file  Occupational History   Occupation: retired  Tobacco Use   Smoking status: Former    Types: Cigarettes    Quit date: 11/22/2019    Years  since quitting: 1.6   Smokeless tobacco: Never   Tobacco comments:    Patient reports quit 3 years ago. 06/25/20. [facility]  Vaping Use   Vaping Use: Never used  Substance and Sexual Activity   Alcohol use: Not Currently    Alcohol/week: 12.0 standard drinks of alcohol    Types: 12 Cans of beer per week    Comment: daily   Drug use: No   Sexual activity: Not Currently  Other Topics Concern   Not on file  Social History Narrative   Not on file   Social Determinants of Health   Financial Resource Strain: Not on file  Food Insecurity: Not on file   Transportation Needs: Not on file  Physical Activity: Not on file  Stress: Not on file  Social Connections: Not on file  Intimate Partner Violence: Not on file    FAMILY HISTORY: Family History  Problem Relation Age of Onset   Cancer Cousin        maternal cousin   Cancer Cousin        paternal cousin   Cancer Cousin    Colon polyps Neg Hx    Pancreatic disease Neg Hx    Pancreatic cancer Neg Hx    Breast cancer Neg Hx    Colon cancer Neg Hx     ALLERGIES:  has No Known Allergies.  MEDICATIONS:  Current Outpatient Medications  Medication Sig Dispense Refill   potassium chloride SA (KLOR-CON M) 20 MEQ tablet Take 1 tablet (20 mEq total) by mouth daily. 30 tablet 0   acetaminophen (TYLENOL) 325 MG tablet Take 2 tablets (650 mg total) by mouth every 6 (six) hours as needed for mild pain or headache (fever >/= 101).     albuterol (VENTOLIN HFA) 108 (90 Base) MCG/ACT inhaler Inhale 2 puffs into the lungs every 6 (six) hours as needed for wheezing or shortness of breath. 8 g 1   amLODipine (NORVASC) 10 MG tablet Take 1 tablet by mouth daily.     atorvastatin (LIPITOR) 20 MG tablet Take 20 mg by mouth daily.     folic acid (FOLVITE) 1 MG tablet Take 1 tablet (1 mg total) by mouth daily.     hydrOXYzine (ATARAX) 25 MG tablet Take 1 tablet (25 mg total) by mouth at bedtime as needed for itching. 30 tablet 1   ipratropium-albuterol (DUONEB) 0.5-2.5 (3) MG/3ML SOLN Take 3 mLs by nebulization every 6 (six) hours as needed. (Patient taking differently: Take 3 mLs by nebulization every 6 (six) hours as needed (shortness of breathing or wheezing).) 360 mL 0   lidocaine (LIDODERM) 5 % Place 1 patch onto the skin daily. Remove & Discard patch within 12 hours or as directed by MD. Apply to low back 30 patch 0   lidocaine-prilocaine (EMLA) cream Apply 1 Application topically as needed. 30 g 0   losartan (COZAAR) 100 MG tablet Take 100 mg by mouth daily.     morphine (MS CONTIN) 15 MG 12 hr  tablet Take 1 tablet (15 mg total) by mouth every 12 (twelve) hours. 60 tablet 0   olmesartan (BENICAR) 40 MG tablet Take 40 mg by mouth daily.     ondansetron (ZOFRAN) 8 MG tablet Take 1 tablet (8 mg total) by mouth every 8 (eight) hours as needed. 30 tablet 0   oxyCODONE (ROXICODONE) 5 MG immediate release tablet Take 1 tablet (5 mg total) by mouth every 6 (six) hours as needed for severe pain. 120 tablet 0  polyethylene glycol (MIRALAX) 17 g packet Take 17 g by mouth daily. 30 each 2   prochlorperazine (COMPAZINE) 10 MG tablet Take 1 tablet (10 mg total) by mouth every 6 (six) hours as needed for nausea or vomiting. 30 tablet 0   senna-docusate (SENNA S) 8.6-50 MG tablet Take 2 tablets by mouth daily. 60 tablet 2   thiamine 100 MG tablet Take 1 tablet (100 mg total) by mouth daily.     No current facility-administered medications for this visit.    REVIEW OF SYSTEMS:   Constitutional: ( - ) fevers, ( - )  chills , ( - ) night sweats Eyes: ( - ) blurriness of vision, ( - ) double vision, ( - ) watery eyes Ears, nose, mouth, throat, and face: ( - ) mucositis, ( - ) sore throat Respiratory: ( - ) cough, ( - ) dyspnea, ( - ) wheezes Cardiovascular: ( - ) palpitation, ( - ) chest discomfort, ( - ) lower extremity swelling Gastrointestinal:  ( - ) nausea, ( - ) heartburn, ( - ) change in bowel habits Skin: ( - ) abnormal skin rashes Lymphatics: ( - ) new lymphadenopathy, ( - ) easy bruising Neurological: ( - ) numbness, ( - ) tingling, ( - ) new weaknesses Behavioral/Psych: ( - ) mood change, ( - ) new changes  All other systems were reviewed with the patient and are negative.  PHYSICAL EXAMINATION: ECOG PERFORMANCE STATUS: 2 - Symptomatic, <50% confined to bed  Vitals:   07/08/21 0954  BP: (!) 158/89  Pulse: 89  Resp: 16  Temp: 97.9 F (36.6 C)  SpO2: 98%    Filed Weights   07/08/21 0954  Weight: 152 lb (68.9 kg)     GENERAL: Well-appearing elderly African-American male,  alert, no distress and comfortable SKIN: skin color, texture, turgor are normal, no rashes or significant lesions EYES: conjunctiva are pink and non-injected, sclera clear LUNGS: clear to auscultation and percussion with normal breathing effort HEART: regular rate & rhythm and no murmurs and no lower extremity edema Musculoskeletal: no cyanosis of digits and no clubbing  PSYCH: alert & oriented x 3, fluent speech NEURO: no focal motor/sensory deficits  LABORATORY DATA:  I have reviewed the data as listed    Latest Ref Rng & Units 07/15/2021    8:22 AM 07/08/2021    9:26 AM 06/20/2021   12:45 PM  CBC  WBC 4.0 - 10.5 K/uL 6.6  14.3  11.6   Hemoglobin 13.0 - 17.0 g/dL 10.3  10.6  11.0   Hematocrit 39.0 - 52.0 % 30.3  31.5  32.4   Platelets 150 - 400 K/uL 380  453  475        Latest Ref Rng & Units 07/15/2021    8:22 AM 07/08/2021    9:26 AM 06/20/2021   12:45 PM  CMP  Glucose 70 - 99 mg/dL 100  110  110   BUN 8 - 23 mg/dL 11  8  7    Creatinine 0.61 - 1.24 mg/dL 0.71  0.59  0.68   Sodium 135 - 145 mmol/L 129  132  134   Potassium 3.5 - 5.1 mmol/L 4.1  3.2  3.4   Chloride 98 - 111 mmol/L 95  97  99   CO2 22 - 32 mmol/L 27  29  26    Calcium 8.9 - 10.3 mg/dL 9.8  9.8  9.8   Total Protein 6.5 - 8.1 g/dL 8.3  8.6  8.6  Total Bilirubin 0.3 - 1.2 mg/dL 0.4  0.4  0.5   Alkaline Phos 38 - 126 U/L 99  98  107   AST 15 - 41 U/L 15  13  16    ALT 0 - 44 U/L 13  7  11      Lab Results  Component Value Date   MPROTEIN Not Observed 04/28/2021   Lab Results  Component Value Date   KPAFRELGTCHN 33.6 (H) 04/28/2021   LAMBDASER 22.0 04/28/2021   KAPLAMBRATIO 1.53 04/28/2021    RADIOGRAPHIC STUDIES: IR IMAGING GUIDED PORT INSERTION  Result Date: 06/29/2021 INDICATION: 66 year old male referred for port catheter EXAM: IMAGE GUIDED PORT CATHETER MEDICATIONS: None ANESTHESIA/SEDATION: Moderate (conscious) sedation was employed during this procedure. A total of Versed 2.0 mg and Fentanyl 100 mcg  was administered intravenously. Moderate Sedation Time: 20 minutes. The patient's level of consciousness and vital signs were monitored continuously by radiology nursing throughout the procedure under my direct supervision. FLUOROSCOPY TIME:  Fluoroscopy Time:  (1 mGy). COMPLICATIONS: None PROCEDURE: The procedure, risks, benefits, and alternatives were explained to the patient. Questions regarding the procedure were encouraged and answered. The patient understands and consents to the procedure. Ultrasound survey was performed with images stored and sent to PACs. Right IJ vein documented to be patent. The right neck and chest was prepped with chlorhexidine, and draped in the usual sterile fashion using maximum barrier technique (cap and mask, sterile gown, sterile gloves, large sterile sheet, hand hygiene and cutaneous antiseptic). Local anesthesia was attained by infiltration with 1% lidocaine without epinephrine. Ultrasound demonstrated patency of the right internal jugular vein, and this was documented with an image. Under real-time ultrasound guidance, this vein was accessed with a 21 gauge micropuncture needle and image documentation was performed. A small dermatotomy was made at the access site with an 11 scalpel. A 0.018" wire was advanced into the SVC and used to estimate the length of the internal catheter. The access needle exchanged for a 49F micropuncture vascular sheath. The 0.018" wire was then removed and a 0.035" wire advanced into the IVC. An appropriate location for the subcutaneous reservoir was selected below the clavicle and an incision was made through the skin and underlying soft tissues. The subcutaneous tissues were then dissected using a combination of blunt and sharp surgical technique and a pocket was formed. A single lumen power injectable portacatheter was then tunneled through the subcutaneous tissues from the pocket to the dermatotomy and the port reservoir placed within the  subcutaneous pocket. The venous access site was then serially dilated and a peel away vascular sheath placed over the wire. The wire was removed and the port catheter advanced into position under fluoroscopic guidance. The catheter tip is positioned in the cavoatrial junction. This was documented with a spot image. The portacatheter was then tested and found to flush and aspirate well. The port was flushed with saline followed by 100 units/mL heparinized saline. The pocket was then closed in two layers using first subdermal inverted interrupted absorbable sutures followed by a running subcuticular suture. The epidermis was then sealed with Dermabond. The dermatotomy at the venous access site was also seal with Dermabond. Patient tolerated the procedure well and remained hemodynamically stable throughout. No complications encountered and no significant blood loss encountered IMPRESSION: Status post right IJ port catheter. Signed, Dulcy Fanny. Nadene Rubins, RPVI [facility] Radiology Electronically Signed   By: Corrie Mckusick D.O.   On: 06/29/2021 14:31    ASSESSMENT &  PLAN Terry Page. 66 y.o. male with medical history significant for newly diagnosed metastatic adenocarcinoma of the lung who presents for a follow up visit.  # Metastatic Adenocarcinoma of the Lung  -- NGS testing from this patient shows no evidence of targetable mutation. --MRI brain shows no evidence of intracranial metastases -At this time would pursue carboplatinum, pembrolizumab, and pemetrexed as treatment for his cancer. --Can consider targeted radiation therapy for areas causing pain if persistent after adjustment of pain medications and starting treatment Plan: --today is Day 1 Cycle 1 of Carbo/Pem/Pem for metastatic adenocarcinoma of the lung --labs today show white blood cell count 6.6, hemoglobin 10.3, platelets 380, and creatinine 0.71 --RTC in 3 weeks time for next cycle  of chemotherapy.   #Pain Control -- Currently prescribed oxycodone 5 mg every 6 hours as needed --Palliative care currently following --Continue senna docusate for constipation prophylaxis --Continue to monitor  #Supportive Care -- chemotherapy education complete -- port placed -- zofran 8mg  q8H PRN and compazine 10mg  PO q6H for nausea -- EMLA cream for port -- Patient referred to palliative care for pain control   No orders of the defined types were placed in this encounter.   All questions were answered. The patient knows to call the clinic with any problems, questions or concerns.  A total of more than 30 minutes were spent on this encounter with face-to-face time and non-face-to-face time, including preparing to see the patient, ordering tests and/or medications, counseling the patient and coordination of care as outlined above.   Ledell Peoples, MD Department of Hematology/Oncology [place] [facility] Phone: [phone] Pager: [phone] Email: [email or web address]  07/17/2021 6:06 PM

## 2021-07-08 NOTE — Progress Notes (Signed)
Terry Page  Telephone:(336) [phone] Fax:(336) [phone]   Name: Hugo Lybrand. Date: 07/08/2021 MRN: [id number]  DOB: 06-Oct-1955  Patient Care Team: [name], MD as PCP - General (Family Medicine)     INTERVAL HISTORY: Terry Page. is a 66 y.o. 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  Palliative ask to see for symptom management.  SOCIAL HISTORY:     reports that he quit smoking about 19 months ago. His smoking use included cigarettes. He has never used smokeless tobacco. He reports that he does not currently use alcohol after a past usage of about 12.0 standard drinks of alcohol per week. He reports that he does not use drugs.  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 Gery Pray   Incidental lung nodule, greater than or equal to 41mm 10/22/2017   Left upper lobe - discovered on CTA   Prostate cancer ([place])    Spontaneous pneumothorax 10/20/2017   right   Tobacco abuse     ALLERGIES:  has No Known Allergies.  MEDICATIONS:  Current Outpatient Medications  Medication Sig Dispense Refill   acetaminophen (TYLENOL) 325 MG tablet Take 2 tablets (650 mg total) by mouth every 6 (six) hours as needed for mild pain or headache (fever >/= 101).     albuterol (VENTOLIN HFA) 108 (90 Base) MCG/ACT inhaler Inhale 2 puffs into the lungs every 6 (six) hours as needed for wheezing or shortness of breath. 8 g 1   amLODipine (NORVASC) 10 MG tablet Take 1 tablet by mouth daily.     atorvastatin (LIPITOR) 20 MG tablet Take 20 mg by mouth  daily.     folic acid (FOLVITE) 1 MG tablet Take 1 tablet (1 mg total) by mouth daily.     hydrOXYzine (ATARAX) 25 MG tablet Take 1 tablet (25 mg total) by mouth at bedtime as needed for itching. 30 tablet 1   ipratropium-albuterol (DUONEB) 0.5-2.5 (3) MG/3ML SOLN Take 3 mLs by nebulization every 6 (six) hours as needed. (Patient taking differently: Take 3 mLs by nebulization every 6 (six) hours as needed (shortness of breathing or wheezing).) 360 mL 0   lidocaine (LIDODERM) 5 % Place 1 patch onto the skin daily. Remove & Discard patch within 12 hours or as directed by MD. Apply to low back 30 patch 0   lidocaine-prilocaine (EMLA) cream Apply 1 Application topically as needed. 30 g 0   losartan (COZAAR) 100 MG tablet Take 100 mg by mouth daily.     morphine (MS CONTIN) 15 MG 12 hr tablet Take 1 tablet (15 mg total) by mouth every 12 (twelve) hours. 60 tablet 0   olmesartan (BENICAR) 40 MG tablet Take 40 mg by mouth daily.     ondansetron (ZOFRAN) 8 MG tablet Take 1 tablet (8 mg total) by mouth every 8 (eight) hours as needed. 30 tablet 0   oxyCODONE (ROXICODONE) 5 MG immediate release tablet Take 1 tablet (5 mg total) by mouth every 6 (six) hours as needed for severe pain. 120 tablet 0  polyethylene glycol (MIRALAX) 17 g packet Take 17 g by mouth daily. 30 each 2   prochlorperazine (COMPAZINE) 10 MG tablet Take 1 tablet (10 mg total) by mouth every 6 (six) hours as needed for nausea or vomiting. 30 tablet 0   senna-docusate (SENNA S) 8.6-50 MG tablet Take 2 tablets by mouth daily. 60 tablet 2   thiamine 100 MG tablet Take 1 tablet (100 mg total) by mouth daily.     No current facility-administered medications for this visit.    VITAL SIGNS: There were no vitals taken for this visit. There were no vitals filed for this visit.  Estimated body mass index is 23.11 kg/m as calculated from the following:   Height as of 06/29/21: 5\' 8"  (1.727 m).   Weight as of an earlier encounter on 07/08/21: 68.9  kg.   PERFORMANCE STATUS (ECOG) : 1 - Symptomatic but completely ambulatory   IMPRESSION: [name] presents to clinic today for symptom management follow-up. No acute distress noted.  Appetite remains good. Denies nausea, vomiting, or diarrhea.  Ambulatory with his rollator.  Expresses appreciation for his improvement with hopes that this will continue.  Neoplasm related pain  Mr. Hence pain remains well controlled.  He is tolerating current regimen without difficulty.  States he is able to sleep better and become a little more active without concerns of any limitations due to pain.    We discussed his regimen. He is tolerating the MS Contin 15mg  and is taking as prescribed.  Is not having to take his oxycodone for breakthrough pain.  Shares he may use 1-2 times daily on occasions but otherwise Tylenol is effective.    No adjustments needed at this time as pain is improved, well controlled, and patient is tolerating current dose. We will continue to closely monitor.   Constipation  Constipation is improved. He is taking Miralax and Senna as recommended.   I discussed the importance of continued conversation with family and their medical providers regarding overall plan of care and treatment options, ensuring decisions are within the context of the patients values and GOCs.  PLAN: Continue with MS Contin 15mg  twice daily. Tolerating well. Pain improved.  Oxy IR 10 mg every 6 hours as needed.  Not having to take daily. Senna daily Miralax daily  I will plan to see patient back in 3-4 weeks in collaboration with his other oncology appointments.    Patient expressed understanding and was in agreement with this plan. He also understands that He can call the clinic at any time with any questions, concerns, or complaints.    Any controlled substances utilized were prescribed in the context of palliative care. PDMP has been reviewed.     Time Total: 20 min   Visit consisted of counseling  and education dealing with the complex and emotionally intense issues of symptom management and palliative care in the setting of serious and potentially life-threatening illness.Greater than 50%  of this time was spent counseling and coordinating care related to the above assessment and plan.  Alda Lea, AGPCNP-BC  Palliative Medicine Team/[place]

## 2021-07-12 ENCOUNTER — Telehealth: Payer: Self-pay | Admitting: *Deleted

## 2021-07-12 ENCOUNTER — Other Ambulatory Visit (HOSPITAL_COMMUNITY): Payer: Self-pay

## 2021-07-12 NOTE — Telephone Encounter (Signed)
Called pt to see how he did with his recent treatment.  He reports doing well with no side effects. He knows how to reach [place] if needed & knows his next appt.

## 2021-07-12 NOTE — Telephone Encounter (Signed)
-----   Message from Rolene Course, RN sent at 07/08/2021  2:05 PM EDT ----- Regarding: Lorenso Courier 1st Tx F/U call - [place] 1st Tx F/U call - Keytruda, Alimta, Carbo.  Pt tolerated well.

## 2021-07-15 ENCOUNTER — Inpatient Hospital Stay: Payer: Medicare HMO

## 2021-07-15 ENCOUNTER — Other Ambulatory Visit: Payer: Self-pay

## 2021-07-15 ENCOUNTER — Ambulatory Visit (HOSPITAL_COMMUNITY)
Admission: RE | Admit: 2021-07-15 | Discharge: 2021-07-15 | Disposition: A | Discharge status: Home / Self Care | Payer: Medicare HMO | Source: Ambulatory Visit | Attending: Hematology and Oncology | Admitting: Hematology and Oncology

## 2021-07-15 ENCOUNTER — Telehealth: Payer: Self-pay | Admitting: *Deleted

## 2021-07-15 DIAGNOSIS — C3412 Malignant neoplasm of upper lobe, left bronchus or lung: Secondary | ICD-10-CM

## 2021-07-15 LAB — CMP (CANCER CENTER ONLY)
ALT: 13 U/L (ref 0–44)
AST: 15 U/L (ref 15–41)
Albumin: 4.1 g/dL (ref 3.5–5.0)
Alkaline Phosphatase: 99 U/L (ref 38–126)
Anion gap: 7 (ref 5–15)
BUN: 11 mg/dL (ref 8–23)
CO2: 27 mmol/L (ref 22–32)
Calcium: 9.8 mg/dL (ref 8.9–10.3)
Chloride: 95 mmol/L — ABNORMAL LOW (ref 98–111)
Creatinine: 0.71 mg/dL (ref 0.61–1.24)
GFR, Estimated: >60 mL/min (ref >60)
Glucose, Bld: 100 mg/dL — ABNORMAL HIGH (ref 70–99)
Potassium: 4.1 mmol/L (ref 3.5–5.1)
Sodium: 129 mmol/L — ABNORMAL LOW (ref 135–145)
Total Bilirubin: 0.4 mg/dL (ref 0.3–1.2)
Total Protein: 8.3 g/dL — ABNORMAL HIGH (ref 6.5–8.1)

## 2021-07-15 LAB — CBC WITH DIFFERENTIAL (CANCER CENTER ONLY)
Abs Immature Granulocytes: 0.02 10*3/uL (ref 0.00–0.07)
Basophils Absolute: 0 10*3/uL (ref 0.0–0.1)
Basophils Relative: 1 %
Eosinophils Absolute: 0.6 10*3/uL — ABNORMAL HIGH (ref 0.0–0.5)
Eosinophils Relative: 9 %
HCT: 30.3 % — ABNORMAL LOW (ref 39.0–52.0)
Hemoglobin: 10.3 g/dL — ABNORMAL LOW (ref 13.0–17.0)
Immature Granulocytes: 0 %
Lymphocytes Relative: 17 %
Lymphs Abs: 1.2 10*3/uL (ref 0.7–4.0)
MCH: 29.4 pg (ref 26.0–34.0)
MCHC: 34 g/dL (ref 30.0–36.0)
MCV: 86.6 fL (ref 80.0–100.0)
Monocytes Absolute: 0.5 10*3/uL (ref 0.1–1.0)
Monocytes Relative: 8 %
Neutro Abs: 4.3 10*3/uL (ref 1.7–7.7)
Neutrophils Relative %: 65 %
Platelet Count: 380 10*3/uL (ref 150–400)
RBC: 3.5 MIL/uL — ABNORMAL LOW (ref 4.22–5.81)
RDW: 14.9 % (ref 11.5–15.5)
WBC Count: 6.6 10*3/uL (ref 4.0–10.5)
nRBC: 0 % (ref 0.0–0.2)

## 2021-07-15 NOTE — Telephone Encounter (Signed)
Received call from pt. He states he was just here for lab work today and went back home and experienced sudden increase in pain in left shoulder. MS Contin has not helped. Took an oxycodone and tylenol without relief.  He has been able to use his cane with that arm but cannot now d/t pain,  Dr. Lorenso Courier informed. Recommended xray of left shoulder. May need referral to Rad.Onc.  TCT patient and spoke with him. Advised that Dr. Lorenso Courier recommends an xray of his shoulder.  Pt states he has transportation issues. Message send to [employer] regarding transportation.  Able to get transportation here today for xray.

## 2021-07-17 ENCOUNTER — Encounter: Payer: Self-pay | Admitting: Hematology and Oncology

## 2021-07-18 ENCOUNTER — Other Ambulatory Visit: Payer: Self-pay | Admitting: Nurse Practitioner

## 2021-07-18 ENCOUNTER — Telehealth: Payer: Self-pay | Admitting: *Deleted

## 2021-07-18 ENCOUNTER — Telehealth: Payer: Self-pay

## 2021-07-18 NOTE — Telephone Encounter (Signed)
error 

## 2021-07-18 NOTE — Telephone Encounter (Signed)
[name] is having increased left shoulder pain. Recent x-ray ordered by Dr. Lorenso Courier showed metastatic lesion to left scapula. Patient is aware of findings. Is complaining of increased pain which is not controlled with his MS Contin. He was initially on Oxy IR and reports this was helpful however began itching without relief despite interventions. Itching has resolved since he discontinued use. He does not have a way to get to pharmacy today to pick up new prescription.   We will arrange for him to come into the clinic tomorrow for IV pain management and oral medication adjustments. He has been advised to take an additional dose of his MS Contin 15mg  today to hopefully provide additional relief. Also Tylenol every 8 hours. He verbalized understanding and appreciation.   [name] confirms awareness of appointment on tomorrow. Transportation is being arranged.   Alda Lea, AGPCNP-BC [place]

## 2021-07-18 NOTE — Telephone Encounter (Signed)
Received call from pt. He is asking about his shoulder xray from last Friday. 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 Advised that Dr. Lorenso Courier will contact RadOnc for possible radiation to that site and I also notified Penni Homans, NP in Palliative Care to assist with pain control.. Terry Page vocied understanding.

## 2021-07-19 ENCOUNTER — Other Ambulatory Visit: Payer: Self-pay

## 2021-07-19 ENCOUNTER — Inpatient Hospital Stay (HOSPITAL_BASED_OUTPATIENT_CLINIC_OR_DEPARTMENT_OTHER): Payer: Medicare HMO | Admitting: Nurse Practitioner

## 2021-07-19 ENCOUNTER — Other Ambulatory Visit (HOSPITAL_COMMUNITY): Payer: Self-pay

## 2021-07-19 ENCOUNTER — Encounter: Payer: Self-pay | Admitting: Nurse Practitioner

## 2021-07-19 ENCOUNTER — Inpatient Hospital Stay: Payer: Medicare HMO

## 2021-07-19 VITALS — BP 116/81 | HR 94 | Temp 98.5°F | Resp 16

## 2021-07-19 DIAGNOSIS — Z95828 Presence of other vascular implants and grafts: Secondary | ICD-10-CM

## 2021-07-19 DIAGNOSIS — R530 Neoplastic (malignant) related fatigue: Secondary | ICD-10-CM | POA: Diagnosis not present

## 2021-07-19 DIAGNOSIS — C3412 Malignant neoplasm of upper lobe, left bronchus or lung: Secondary | ICD-10-CM | POA: Diagnosis not present

## 2021-07-19 DIAGNOSIS — C61 Malignant neoplasm of prostate: Secondary | ICD-10-CM

## 2021-07-19 DIAGNOSIS — K5903 Drug induced constipation: Secondary | ICD-10-CM | POA: Diagnosis not present

## 2021-07-19 DIAGNOSIS — G893 Neoplasm related pain (acute) (chronic): Secondary | ICD-10-CM

## 2021-07-19 DIAGNOSIS — Z515 Encounter for palliative care: Secondary | ICD-10-CM | POA: Diagnosis not present

## 2021-07-19 DIAGNOSIS — R630 Anorexia: Secondary | ICD-10-CM

## 2021-07-19 DIAGNOSIS — L299 Pruritus, unspecified: Secondary | ICD-10-CM

## 2021-07-19 DIAGNOSIS — Z5112 Encounter for antineoplastic immunotherapy: Secondary | ICD-10-CM | POA: Diagnosis not present

## 2021-07-19 MED ORDER — DIPHENHYDRAMINE HCL 50 MG/ML IJ SOLN
12.5000 mg | Freq: Once | INTRAMUSCULAR | Status: AC
  Administered 2021-07-19: 12.5 mg via INTRAVENOUS
  Filled 2021-07-19: qty 1

## 2021-07-19 MED ORDER — MORPHINE SULFATE 15 MG PO TABS
15.0000 mg | ORAL_TABLET | Freq: Four times a day (QID) | ORAL | 0 refills | Status: DC | PRN
Start: 2021-07-19 — End: 2021-10-03

## 2021-07-19 MED ORDER — MORPHINE SULFATE 15 MG PO TABS
15.0000 mg | ORAL_TABLET | Freq: Four times a day (QID) | ORAL | 0 refills | Status: DC | PRN
  Filled 2021-07-19: qty 60, 15d supply, fill #0

## 2021-07-19 MED ORDER — SODIUM CHLORIDE 0.9 % IV SOLN: Freq: Once | INTRAVENOUS | Status: AC

## 2021-07-19 MED ORDER — SODIUM CHLORIDE 0.9% FLUSH
10.0000 mL | Freq: Once | INTRAVENOUS | Status: AC
  Administered 2021-07-19: 10 mL

## 2021-07-19 MED ORDER — MORPHINE SULFATE (PF) 2 MG/ML IV SOLN
2.0000 mg | Freq: Once | INTRAVENOUS | Status: AC
  Administered 2021-07-19: 2 mg via INTRAVENOUS
  Filled 2021-07-19: qty 1

## 2021-07-19 MED ORDER — KETOROLAC TROMETHAMINE 30 MG/ML IJ SOLN
15.0000 mg | Freq: Once | INTRAMUSCULAR | Status: AC
  Administered 2021-07-19: 15 mg via INTRAVENOUS
  Filled 2021-07-19 (×2): qty 1

## 2021-07-19 MED ORDER — DIPHENHYDRAMINE HCL 50 MG/ML IJ SOLN: 12.5000 mg | Freq: Once | INTRAMUSCULAR | Status: DC

## 2021-07-19 MED ORDER — HEPARIN SOD (PORK) LOCK FLUSH 100 UNIT/ML IV SOLN
500.0000 [IU] | Freq: Once | INTRAVENOUS | Status: AC
  Administered 2021-07-19: 500 [IU]

## 2021-07-19 MED ORDER — MORPHINE SULFATE (PF) 4 MG/ML IV SOLN: 4.0000 mg | Freq: Once | INTRAVENOUS | Status: DC

## 2021-07-19 NOTE — Patient Instructions (Signed)
Rehydration, Adult Rehydration is the replacement of body fluids, salts, and minerals (electrolytes) that are lost during dehydration. Dehydration is when there is not enough water or other fluids in the body. This happens when you lose more fluids than you take in. Common causes of dehydration include: Not drinking enough fluids. This can occur when you are ill or doing activities that require a lot of energy, especially in hot weather. Conditions that cause loss of water or other fluids, such as diarrhea, vomiting, sweating, or urinating a lot. Other illnesses, such as fever or infection. Certain medicines, such as those that remove excess fluid from the body (diuretics). Symptoms of mild or moderate dehydration may include thirst, dry lips and mouth, and dizziness. Symptoms of severe dehydration may include increased heart rate, confusion, fainting, and not urinating. For severe dehydration, you may need to get fluids through an IV at the hospital. For mild or moderate dehydration, you can usually rehydrate at home by drinking certain fluids as told by your health care provider. What are the risks? Generally, rehydration is safe. However, taking in too much fluid (overhydration) can be a problem. This is rare. Overhydration can cause an electrolyte imbalance, kidney failure, or a decrease in salt (sodium) levels in the body. Supplies needed You will need an oral rehydration solution (ORS) if your health care provider tells you to use one. This is a drink to treat dehydration. It can be found in pharmacies and retail stores. How to rehydrate Fluids Follow instructions from your health care provider for rehydration. The kind of fluid and the amount you should drink depend on your condition. In general, you should choose drinks that you prefer. If told by your health care provider, drink an ORS. Make an ORS by following instructions on the package. Start by drinking small amounts, about  cup (120  mL) every 5-10 minutes. Slowly increase how much you drink until you have taken the amount recommended by your health care provider. Drink enough clear fluids to keep your urine pale yellow. If you were told to drink an ORS, finish it first, then start slowly drinking other clear fluids. Drink fluids such as: Water. This includes sparkling water and flavored water. Drinking only water can lead to having too little sodium in your body (hyponatremia). Follow the advice of your health care provider. Water from ice chips you suck on. Fruit juice with water you add to it (diluted). Sports drinks. Hot or cold herbal teas. Broth-based soups. Milk or milk products. Food Follow instructions from your health care provider about what to eat while you rehydrate. Your health care provider may recommend that you slowly begin eating regular foods in small amounts. Eat foods that contain a healthy balance of electrolytes, such as bananas, oranges, potatoes, tomatoes, and spinach. Avoid foods that are greasy or contain a lot of sugar. In some cases, you may get nutrition through a feeding tube that is passed through your nose and into your stomach (nasogastric tube, or NG tube). This may be done if you have uncontrolled vomiting or diarrhea. Beverages to avoid  Certain beverages may make dehydration worse. While you rehydrate, avoid drinking alcohol. How to tell if you are recovering from dehydration You may be recovering from dehydration if: You are urinating more often than before you started rehydrating. Your urine is pale yellow. Your energy level improves. You vomit less frequently. You have diarrhea less frequently. Your appetite improves or returns to normal. You feel less dizzy or less light-headed.   Your skin tone and color start to look more normal. Follow these instructions at home: Take over-the-counter and prescription medicines only as told by your health care provider. Do not take sodium  tablets. Doing this can lead to having too much sodium in your body (hypernatremia). Contact a health care provider if: You continue to have symptoms of mild or moderate dehydration, such as: Thirst. Dry lips. Slightly dry mouth. Dizziness. Dark urine or less urine than normal. Muscle cramps. You continue to vomit or have diarrhea. Get help right away if you: Have symptoms of dehydration that get worse. Have a fever. Have a severe headache. Have been vomiting and the following happens: Your vomiting gets worse or does not go away. Your vomit includes blood or green matter (bile). You cannot eat or drink without vomiting. Have problems with urination or bowel movements, such as: Diarrhea that gets worse or does not go away. Blood in your stool (feces). This may cause stool to look black and tarry. Not urinating, or urinating only a small amount of very dark urine, within 6-8 hours. Have trouble breathing. Have symptoms that get worse with treatment. These symptoms may represent a serious problem that is an emergency. Do not wait to see if the symptoms will go away. Get medical help right away. Call your local emergency services (911 in the [place] Do not drive yourself to the hospital. Summary Rehydration is the replacement of body fluids and minerals (electrolytes) that are lost during dehydration. Follow instructions from your health care provider for rehydration. The kind of fluid and amount you should drink depend on your condition. Slowly increase how much you drink until you have taken the amount recommended by your health care provider. Contact your health care provider if you continue to show signs of mild or moderate dehydration. This information is not intended to replace advice given to you by your health care provider. Make sure you discuss any questions you have with your health care provider. Document Revised: 02/19/2019 Document Reviewed: 12/30/2018 Elsevier Patient  Education  2023 [employer]  

## 2021-07-19 NOTE — Patient Instructions (Signed)
-   take the MS contin (long acting morphine) in the morning and in the night - take 2 hydroxyzine (atarax) tablets at bedtime   - pick up your MSIR (short acting morphine) and take 1 tab every 4 hours as needed for pain.

## 2021-07-19 NOTE — Progress Notes (Signed)
Orders placed under signed and held for [facility]

## 2021-07-19 NOTE — Progress Notes (Signed)
[place]  Telephone:([phone] [phone]) [phone]   Name: Terry Page. Date: 07/19/2021 MRN: [id number]  DOB: [birth year]  Terry Page Care Team: [name], MD as PCP - General (Family Medicine)     INTERVAL HISTORY: Terry Matsuura. is a 66 y.o. 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  Palliative ask to see for symptom management.  SOCIAL HISTORY:     reports that he quit smoking about 19 months ago. His smoking use included cigarettes. He has never used smokeless tobacco. He reports that he does not currently use alcohol after a past usage of about 12.0 standard drinks of alcohol per week. He reports that he does not use drugs.  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 Gery Pray   Incidental lung nodule, greater than or equal to 64mm 10/22/2017   Left upper lobe - discovered on CTA   Prostate cancer ([place])    Spontaneous pneumothorax 10/20/2017   right   Tobacco abuse     ALLERGIES:  has No Known Allergies.  MEDICATIONS:  Current Outpatient Medications  Medication Sig Dispense Refill   acetaminophen (TYLENOL) 325 MG tablet Take 2 tablets (650 mg total) by mouth every 6 (six) hours as needed for mild pain or headache (fever >/= 101).     albuterol (VENTOLIN HFA) 108 (90 Base) MCG/ACT inhaler Inhale 2 puffs into the lungs every 6 (six) hours as needed for wheezing or shortness of breath. 8 g 1   amLODipine (NORVASC) 10 MG tablet Take 1 tablet by mouth daily.     atorvastatin (LIPITOR) 20 MG tablet Take 20 mg by  mouth daily.     folic acid (FOLVITE) 1 MG tablet Take 1 tablet (1 mg total) by mouth daily.     hydrOXYzine (ATARAX) 25 MG tablet Take 1 tablet (25 mg total) by mouth at bedtime as needed for itching. 30 tablet 1   ipratropium-albuterol (DUONEB) 0.5-2.5 (3) MG/3ML SOLN Take 3 mLs by nebulization every 6 (six) hours as needed. (Terry Page taking differently: Take 3 mLs by nebulization every 6 (six) hours as needed (shortness of breathing or wheezing).) 360 mL 0   lidocaine (LIDODERM) 5 % Place 1 patch onto the skin daily. Remove & Discard patch within 12 hours or as directed by MD. Apply to low back 30 patch 0   lidocaine-prilocaine (EMLA) cream Apply 1 Application topically as needed. 30 g 0   losartan (COZAAR) 100 MG tablet Take 100 mg by mouth daily.     morphine (MS CONTIN) 15 MG 12 hr tablet Take 1 tablet (15 mg total) by mouth every 12 (twelve) hours. 60 tablet 0   morphine (MSIR) 15 MG tablet Take 1 tablet (15 mg total) by mouth every 6 (six) hours as needed for severe pain. 60 tablet 0   olmesartan (BENICAR) 40 MG tablet Take 40 mg by mouth daily.     ondansetron (ZOFRAN) 8 MG tablet Take 1 tablet (8 mg total) by mouth every 8 (eight) hours as needed. 30 tablet 0   polyethylene  glycol (MIRALAX) 17 g packet Take 17 g by mouth daily. 30 each 2   potassium chloride SA (KLOR-CON M) 20 MEQ tablet Take 1 tablet (20 mEq total) by mouth daily. 30 tablet 0   prochlorperazine (COMPAZINE) 10 MG tablet Take 1 tablet (10 mg total) by mouth every 6 (six) hours as needed for nausea or vomiting. 30 tablet 0   senna-docusate (SENNA S) 8.6-50 MG tablet Take 2 tablets by mouth daily. 60 tablet 2   thiamine 100 MG tablet Take 1 tablet (100 mg total) by mouth daily.     No current facility-administered medications for this visit.    VITAL SIGNS: There were no vitals taken for this visit. There were no vitals filed for this visit.  Estimated body mass index is 23.11 kg/m as calculated from the following:    Height as of 06/29/21: 5\' 8"  (1.727 m).   Weight as of 07/08/21: 152 lb (68.9 kg).   PERFORMANCE STATUS (ECOG) : 1 - Symptomatic but completely ambulatory   IMPRESSION: Terry Page presents to clinic today for symptom management follow-up. No acute distress noted.  Appetite remains good. Denies nausea, vomiting, or diarrhea.  Ambulatory with his rollator.  Expresses appreciation for his improvement with hopes that this will continue.  Neoplasm related pain  Terry Page has been experiencing increased pain in his left shoulder. Recent scans showed lesion. Per Dr. Lorenso Courier will refer to Rad Onc for further treatment options.   Terry Page states he discontinued his Oxy IR due to severe itching not relieved by medication. He felt certain this was caused by his oxycodone. Itching is mild but much improved since discontinued. Unfortunately his pain has worsened and he does not have a breakthrough medication available.   We will administer IVFs and IV pain medications with hopes of providing him some relief.   I discussed at length time frame of him noticing his itching. He does not think it was related to his MS Contin. Took an additional dose yesterday as advised. No increase in pruritus additional relief with his pain. States he was able to get some rest which he had not in several days.   Education provided on use of MS IR for breakthrough pain. He is aware to contact our office immediately with any symptoms. He has atarax for his itching.   We will continue to closely monitor.   Constipation  Constipation is improved. He is taking Miralax and Senna as recommended.   I discussed the importance of continued conversation with family and their medical providers regarding overall plan of care and treatment options, ensuring decisions are within the context of the patients values and GOCs.  PLAN: Continue with MS Contin 15mg  twice daily. Tolerating well.  Discontinued Oxy IR due to itching. No rash per  Terry Page.  MS IR 15 mg every 6 hours as needed. Terry Page knows to call with any side effects. Will continue to monitor.  Senna daily Miralax daily  I will plan to see Terry Page back in 3-4 weeks in collaboration with his other oncology appointments. Will give him a call next week for close symptom follow-up.    Terry Page expressed understanding and was in agreement with this plan. He also understands that He can call the clinic at any time with any questions, concerns, or complaints.    Any controlled substances utilized were prescribed in the context of palliative care. PDMP has been reviewed.      Time Total: 40 min   Visit consisted of counseling  and education dealing with the complex and emotionally intense issues of symptom management and palliative care in the setting of serious and potentially life-threatening illness.Greater than 50%  of this time was spent counseling and coordinating care related to the above assessment and plan.  Alda Lea, AGPCNP-BC  Palliative Medicine Team/[place]

## 2021-07-21 ENCOUNTER — Other Ambulatory Visit: Payer: Medicare HMO

## 2021-07-22 ENCOUNTER — Inpatient Hospital Stay: Payer: Medicare HMO

## 2021-07-25 ENCOUNTER — Other Ambulatory Visit (HOSPITAL_COMMUNITY): Payer: Self-pay

## 2021-07-26 ENCOUNTER — Inpatient Hospital Stay (HOSPITAL_BASED_OUTPATIENT_CLINIC_OR_DEPARTMENT_OTHER): Payer: Medicare HMO | Admitting: Nurse Practitioner

## 2021-07-26 ENCOUNTER — Encounter: Payer: Self-pay | Admitting: Nurse Practitioner

## 2021-07-26 DIAGNOSIS — G893 Neoplasm related pain (acute) (chronic): Secondary | ICD-10-CM | POA: Diagnosis not present

## 2021-07-26 DIAGNOSIS — C3412 Malignant neoplasm of upper lobe, left bronchus or lung: Secondary | ICD-10-CM

## 2021-07-26 DIAGNOSIS — Z515 Encounter for palliative care: Secondary | ICD-10-CM | POA: Diagnosis not present

## 2021-07-26 DIAGNOSIS — K5903 Drug induced constipation: Secondary | ICD-10-CM

## 2021-07-26 NOTE — Progress Notes (Signed)
[place]  Telephone:(336) [phone] [phone]) [phone]   Name: Santino Kinsella. Date: 07/26/2021 MRN: [id number]  DOB: 1955/08/14  Patient Care Team: [name], MD as PCP - General (Family Medicine)   I connected with [name]. on 07/26/21 at  9:30 AM EDT by phone and verified that I am speaking with the correct person using two identifiers.   I discussed the limitations, risks, security and privacy concerns of performing an evaluation and management service by telemedicine and the availability of in-person appointments. I also discussed with the patient that there may be a patient responsible charge related to this service. The patient expressed understanding and agreed to proceed.   Other persons participating in the visit and their role in the encounter: [place], RN    Patient's location: Home  Provider's location: [place]    Chief Complaint: Symptom Management    INTERVAL HISTORY: Esten Dollar. is a 66 y.o. 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  Palliative ask to see for symptom management.  SOCIAL HISTORY:     reports that he quit smoking about 20 months ago. His smoking use included cigarettes. He has never used smokeless tobacco. He reports that he does not currently use alcohol after a past usage of about 12.0 standard drinks of alcohol per week. He reports that he does not use drugs.  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 Gery Pray   Incidental lung nodule, greater than or equal to 35mm  10/22/2017   Left upper lobe - discovered on CTA   Prostate cancer ([place])    Spontaneous pneumothorax 10/20/2017   right   Tobacco abuse     ALLERGIES:  has No Known Allergies.  MEDICATIONS:  Current Outpatient Medications  Medication Sig Dispense Refill   acetaminophen (TYLENOL) 325 MG tablet Take 2 tablets (650 mg total) by mouth every 6 (six) hours as needed for mild pain or headache (fever >/= 101).     albuterol (VENTOLIN HFA) 108 (90 Base) MCG/ACT inhaler Inhale 2 puffs into the lungs every 6 (six) hours as needed for wheezing or shortness of breath. 8 g 1   amLODipine (NORVASC) 10 MG tablet Take 1 tablet by mouth daily.     atorvastatin (LIPITOR) 20 MG tablet Take 20 mg by mouth daily.     folic acid (FOLVITE) 1 MG tablet Take 1 tablet (1 mg total) by mouth daily.     hydrOXYzine (ATARAX) 25 MG tablet Take 1 tablet (25 mg total) by mouth at bedtime as needed for itching. 30 tablet 1   ipratropium-albuterol (DUONEB) 0.5-2.5 (3) MG/3ML SOLN Take 3 mLs by nebulization every 6 (six) hours as needed. (Patient taking differently: Take 3 mLs by nebulization every 6 (six) hours as needed (shortness of breathing or wheezing).) 360 mL 0   lidocaine (LIDODERM) 5 % Place 1 patch onto the skin daily. Remove & Discard patch within 12 hours or as directed by MD. Apply to low back 30 patch 0   lidocaine-prilocaine (EMLA) cream Apply  1 Application topically as needed. 30 g 0   losartan (COZAAR) 100 MG tablet Take 100 mg by mouth daily.     morphine (MS CONTIN) 15 MG 12 hr tablet Take 1 tablet (15 mg total) by mouth every 12 (twelve) hours. 60 tablet 0   morphine (MSIR) 15 MG tablet Take 1 tablet (15 mg total) by mouth every 6 (six) hours as needed for severe pain. 60 tablet 0   olmesartan (BENICAR) 40 MG tablet Take 40 mg by mouth daily.     ondansetron (ZOFRAN) 8 MG tablet Take 1 tablet (8 mg total) by mouth every 8 (eight) hours as needed. 30 tablet 0   polyethylene glycol (MIRALAX) 17 g packet  Take 17 g by mouth daily. 30 each 2   potassium chloride SA (KLOR-CON M) 20 MEQ tablet Take 1 tablet (20 mEq total) by mouth daily. 30 tablet 0   prochlorperazine (COMPAZINE) 10 MG tablet Take 1 tablet (10 mg total) by mouth every 6 (six) hours as needed for nausea or vomiting. 30 tablet 0   senna-docusate (SENNA S) 8.6-50 MG tablet Take 2 tablets by mouth daily. 60 tablet 2   thiamine 100 MG tablet Take 1 tablet (100 mg total) by mouth daily.     No current facility-administered medications for this visit.    VITAL SIGNS: There were no vitals taken for this visit. There were no vitals filed for this visit.  Estimated body mass index is 23.11 kg/m as calculated from the following:   Height as of 06/29/21: 5\' 8"  (1.727 m).   Weight as of 07/08/21: 152 lb (68.9 kg).   PERFORMANCE STATUS (ECOG) : 1 - Symptomatic but completely ambulatory   IMPRESSION: I connected with Mr. Douthit by phone for symptom management follow-up. Denies any acute distress. Denies nausea, vomiting, or diarrhea.    Neoplasm related pain  Mr. Dubie has been experiencing increased pain in his left shoulder. Recent scans showed lesion.   Reports some improvement with recent changes to pain medications. Also confirms that his pruritus has almost resolved.   We discussed at length his current regimen. Mr. Lumm is tolerating MS IR. When reviewing medications he has not been taking his MS Contin. States he was confused with both medications having similar names and thought he was supposed to discontinue taking. Further education provided with instructions to restart his MS Contin this morning. Education provided on use of long-acting (MS Contin) with support of immediate release (MS IR) for breakthrough pain.  He verbalized understanding.   We will continue to closely monitor.   Constipation  Constipation is improved. He is taking Miralax and Senna as recommended.   I discussed the importance of continued conversation with  family and their medical providers regarding overall plan of care and treatment options, ensuring decisions are within the context of the patients values and GOCs.  PLAN: Continue with MS Contin 15mg  twice daily. Will restart this morning. Has not been taking as he was confused about instructions.  MS IR 15 mg every 6 hours as needed. Patient knows to call with any side effects. Tolerating well. Will continue to monitor.  Senna daily Miralax daily  I will plan to see patient back in 3-4 weeks in collaboration with his other oncology appointments. Will give him a call next week for close symptom follow-up.    Patient expressed understanding and was in agreement with this plan. He also understands that He can call the clinic at any time with any  questions, concerns, or complaints.      Any controlled substances utilized were prescribed in the context of palliative care. PDMP has been reviewed.    Time Total: 40 min   Visit consisted of counseling and education dealing with the complex and emotionally intense issues of symptom management and palliative care in the setting of serious and potentially life-threatening illness.Greater than 50%  of this time was spent counseling and coordinating care related to the above assessment and plan.  Alda Lea, AGPCNP-BC  Palliative Medicine Team/[place]

## 2021-07-28 MED FILL — Dexamethasone Sodium Phosphate Inj 100 MG/10ML: INTRAMUSCULAR | Qty: 1 | Status: AC

## 2021-07-28 MED FILL — Fosaprepitant Dimeglumine For IV Infusion 150 MG (Base Eq): INTRAVENOUS | Qty: 5 | Status: AC

## 2021-07-29 ENCOUNTER — Inpatient Hospital Stay: Payer: Medicare HMO

## 2021-07-29 ENCOUNTER — Inpatient Hospital Stay (HOSPITAL_BASED_OUTPATIENT_CLINIC_OR_DEPARTMENT_OTHER): Payer: Medicare HMO | Admitting: Hematology and Oncology

## 2021-07-29 ENCOUNTER — Other Ambulatory Visit: Payer: Self-pay | Admitting: Hematology and Oncology

## 2021-07-29 ENCOUNTER — Inpatient Hospital Stay: Payer: Medicare HMO | Admitting: Dietician

## 2021-07-29 ENCOUNTER — Other Ambulatory Visit: Payer: Self-pay

## 2021-07-29 VITALS — BP 101/77 | HR 96 | Temp 98.0°F | Resp 16 | Wt 148.3 lb

## 2021-07-29 VITALS — BP 123/80 | HR 88 | Resp 18

## 2021-07-29 DIAGNOSIS — Z95828 Presence of other vascular implants and grafts: Secondary | ICD-10-CM | POA: Diagnosis not present

## 2021-07-29 DIAGNOSIS — Z5112 Encounter for antineoplastic immunotherapy: Secondary | ICD-10-CM | POA: Diagnosis not present

## 2021-07-29 DIAGNOSIS — C3412 Malignant neoplasm of upper lobe, left bronchus or lung: Secondary | ICD-10-CM | POA: Diagnosis not present

## 2021-07-29 LAB — CMP (CANCER CENTER ONLY)
ALT: 10 U/L (ref 0–44)
AST: 14 U/L — ABNORMAL LOW (ref 15–41)
Albumin: 4.1 g/dL (ref 3.5–5.0)
Alkaline Phosphatase: 89 U/L (ref 38–126)
Anion gap: 6 (ref 5–15)
BUN: 21 mg/dL (ref 8–23)
CO2: 22 mmol/L (ref 22–32)
Calcium: 9.7 mg/dL (ref 8.9–10.3)
Chloride: 100 mmol/L (ref 98–111)
Creatinine: 0.91 mg/dL (ref 0.61–1.24)
GFR, Estimated: >60 mL/min (ref >60)
Glucose, Bld: 101 mg/dL — ABNORMAL HIGH (ref 70–99)
Potassium: 5 mmol/L (ref 3.5–5.1)
Sodium: 128 mmol/L — ABNORMAL LOW (ref 135–145)
Total Bilirubin: 0.3 mg/dL (ref 0.3–1.2)
Total Protein: 8.8 g/dL — ABNORMAL HIGH (ref 6.5–8.1)

## 2021-07-29 LAB — CBC WITH DIFFERENTIAL (CANCER CENTER ONLY)
Abs Immature Granulocytes: 0.06 10*3/uL (ref 0.00–0.07)
Basophils Absolute: 0.1 10*3/uL (ref 0.0–0.1)
Basophils Relative: 1 %
Eosinophils Absolute: 0.4 10*3/uL (ref 0.0–0.5)
Eosinophils Relative: 3 %
HCT: 29.5 % — ABNORMAL LOW (ref 39.0–52.0)
Hemoglobin: 9.9 g/dL — ABNORMAL LOW (ref 13.0–17.0)
Immature Granulocytes: 1 %
Lymphocytes Relative: 17 %
Lymphs Abs: 1.8 10*3/uL (ref 0.7–4.0)
MCH: 28.9 pg (ref 26.0–34.0)
MCHC: 33.6 g/dL (ref 30.0–36.0)
MCV: 86.3 fL (ref 80.0–100.0)
Monocytes Absolute: 1.7 10*3/uL — ABNORMAL HIGH (ref 0.1–1.0)
Monocytes Relative: 16 %
Neutro Abs: 6.7 10*3/uL (ref 1.7–7.7)
Neutrophils Relative %: 62 %
Platelet Count: 511 10*3/uL — ABNORMAL HIGH (ref 150–400)
RBC: 3.42 MIL/uL — ABNORMAL LOW (ref 4.22–5.81)
RDW: 15.1 % (ref 11.5–15.5)
WBC Count: 10.6 10*3/uL — ABNORMAL HIGH (ref 4.0–10.5)
nRBC: 0 % (ref 0.0–0.2)

## 2021-07-29 LAB — TSH: TSH: 1.359 u[IU]/mL (ref 0.350–4.500)

## 2021-07-29 MED ORDER — SODIUM CHLORIDE 0.9 % IV SOLN
470.0000 mg | Freq: Once | INTRAVENOUS | Status: AC
  Administered 2021-07-29: 470 mg via INTRAVENOUS
  Filled 2021-07-29: qty 47

## 2021-07-29 MED ORDER — SODIUM CHLORIDE 0.9% FLUSH
10.0000 mL | INTRAVENOUS | Status: DC | PRN
  Administered 2021-07-29: 10 mL

## 2021-07-29 MED ORDER — SODIUM CHLORIDE 0.9 % IV SOLN
200.0000 mg | Freq: Once | INTRAVENOUS | Status: AC
  Administered 2021-07-29: 200 mg via INTRAVENOUS
  Filled 2021-07-29: qty 200

## 2021-07-29 MED ORDER — MORPHINE SULFATE ER 15 MG PO TBCR: 15.0000 mg | EXTENDED_RELEASE_TABLET | Freq: Two times a day (BID) | ORAL | 0 refills | Status: DC

## 2021-07-29 MED ORDER — SODIUM CHLORIDE 0.9 % IV SOLN
500.0000 mg/m2 | Freq: Once | INTRAVENOUS | Status: AC
  Administered 2021-07-29: 900 mg via INTRAVENOUS
  Filled 2021-07-29: qty 20

## 2021-07-29 MED ORDER — SODIUM CHLORIDE 0.9 % IV SOLN
10.0000 mg | Freq: Once | INTRAVENOUS | Status: AC
  Administered 2021-07-29: 10 mg via INTRAVENOUS
  Filled 2021-07-29: qty 10

## 2021-07-29 MED ORDER — SODIUM CHLORIDE 0.9 % IV SOLN: Freq: Once | INTRAVENOUS | Status: AC

## 2021-07-29 MED ORDER — PALONOSETRON HCL INJECTION 0.25 MG/5ML
0.2500 mg | Freq: Once | INTRAVENOUS | Status: AC
  Administered 2021-07-29: 0.25 mg via INTRAVENOUS
  Filled 2021-07-29: qty 5

## 2021-07-29 MED ORDER — HEPARIN SOD (PORK) LOCK FLUSH 100 UNIT/ML IV SOLN
500.0000 [IU] | Freq: Once | INTRAVENOUS | Status: AC | PRN
  Administered 2021-07-29: 500 [IU]

## 2021-07-29 MED ORDER — SODIUM CHLORIDE 0.9 % IV SOLN
150.0000 mg | Freq: Once | INTRAVENOUS | Status: AC
  Administered 2021-07-29: 150 mg via INTRAVENOUS
  Filled 2021-07-29: qty 150

## 2021-07-29 MED ORDER — SODIUM CHLORIDE 0.9% FLUSH
10.0000 mL | Freq: Once | INTRAVENOUS | Status: AC
  Administered 2021-07-29: 10 mL

## 2021-07-29 NOTE — Progress Notes (Signed)
[facility] [phone]   [phone]  PROGRESS NOTE  Patient Care Team: [name], MD as PCP - General (Family Medicine)  Hematological/Oncological History # Metastatic Adenocarcinoma of the Lung  01/07/2020 : CT of the chest with contrast performed which showed interval enlargement of a spiculated nodule in the central left upper lobe measuring 1.2 x 1.0 cm and highly suspicious for primary lung malignancy 02/06/2020 :PET scan performed which showed a hypermetabolic irregular solid 1.3 cm left upper lobe pulmonary nodule compatible with malignancy without any other hypermetabolic lesions 06/06/4648- 04/19/2020:  SBRT to the lung lesion 04/28/2021: CT C/A/P showed multifocal lytic bone metastases are identified. Lesion within the spine of the left scapula and lesions involving bilateral iliac bones and left sacral wing. Establish care with Dr. Lorenso Courier  05/03/2021: biopsy of right iliac lytic lesion showed metastatic carcinoma, consistent with lung primary.  07/08/2021: Cycle 1 Day 1 of Carbo/Pem/Pem 07/29/2021: Cycle 2 Day 1 of Carbo/Pem/Pem  #Adenocarcinoma of the Prostate, [id number] 08/19/2019-10/07/2019: 70 Gy in 28 fractions of 2.5 Gy.  Radiation to the prostate was under the care of Dr. Tyler Pita  Interval History:  Terry Page. 66 y.o. male with medical history significant for metastatic adenocarcinoma of the lung who presents for a follow up visit. The patient's last visit was on 07/08/2021. In the interim since the last visit he has completed Cycle 1 of chemotherapy.   On exam today Terry Page notes he tolerated for cycle of chemotherapy quite well.  He was not having any issues with nausea, vomit, or diarrhea.  He has not required any of his as needed medications.  He notes that his energy levels have been good though he does feel little fatigue after taking his pain medication.  He reports his pain is under relatively good control.  His shoulder is  the predominant issue he is been having.  He feels like it is "broken".  He notes that he is running low on his long-acting morphine and takes his short acting morphine approximate every 7 hours.  His port is not currently giving him any trouble though he did forget to take his Emla cream this morning.  His appetite has been good, though he has lost 4 pounds in the interim since her last visit.  He notes he is not having any issues with nausea, vomiting, or diarrhea.  He denies any fevers, chills, sweats.  A full 10 point ROS is listed below.  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 Gery Pray   Incidental lung nodule, greater than or equal to 38mm 10/22/2017   Left upper lobe - discovered on CTA   Prostate cancer ([place])    Spontaneous pneumothorax 10/20/2017   right   Tobacco abuse     SURGICAL HISTORY: Past Surgical History:  Procedure Laterality Date   BACK SURGERY     IR IMAGING GUIDED PORT INSERTION  06/29/2021   PROSTATE BIOPSY      SOCIAL HISTORY: Social History   Socioeconomic History   Marital status: Single    Spouse name: Not on file   Number of children: Not on file   Years of education: Not on file   Highest education level: Not on file  Occupational History  Occupation: retired  Tobacco Use   Smoking status: Former    Types: Cigarettes    Quit date: 11/22/2019    Years since quitting: 1.6   Smokeless tobacco: Never   Tobacco comments:    Patient reports quit 3 years ago. 06/25/20. [facility]  Vaping Use   Vaping Use: Never used  Substance and Sexual Activity   Alcohol use: Not Currently    Alcohol/week: 12.0 standard drinks of alcohol    Types: 12 Cans of beer per week    Comment: daily   Drug use: No   Sexual activity: Not Currently  Other Topics Concern   Not on  file  Social History Narrative   Not on file   Social Determinants of Health   Financial Resource Strain: Not on file  Food Insecurity: Not on file  Transportation Needs: Unmet Transportation Needs (07/18/2021)   PRAPARE - Hydrologist (Medical): Yes    Lack of Transportation (Non-Medical): Yes  Physical Activity: Not on file  Stress: Not on file  Social Connections: Not on file  Intimate Partner Violence: Not on file    FAMILY HISTORY: Family History  Problem Relation Age of Onset   Cancer Cousin        maternal cousin   Cancer Cousin        paternal cousin   Cancer Cousin    Colon polyps Neg Hx    Pancreatic disease Neg Hx    Pancreatic cancer Neg Hx    Breast cancer Neg Hx    Colon cancer Neg Hx     ALLERGIES:  has No Known Allergies.  MEDICATIONS:  Current Outpatient Medications  Medication Sig Dispense Refill   acetaminophen (TYLENOL) 325 MG tablet Take 2 tablets (650 mg total) by mouth every 6 (six) hours as needed for mild pain or headache (fever >/= 101).     albuterol (VENTOLIN HFA) 108 (90 Base) MCG/ACT inhaler Inhale 2 puffs into the lungs every 6 (six) hours as needed for wheezing or shortness of breath. 8 g 1   amLODipine (NORVASC) 10 MG tablet Take 1 tablet by mouth daily.     atorvastatin (LIPITOR) 20 MG tablet Take 20 mg by mouth daily.     folic acid (FOLVITE) 1 MG tablet Take 1 tablet (1 mg total) by mouth daily.     hydrOXYzine (ATARAX) 25 MG tablet Take 1 tablet (25 mg total) by mouth at bedtime as needed for itching. 30 tablet 1   ipratropium-albuterol (DUONEB) 0.5-2.5 (3) MG/3ML SOLN Take 3 mLs by nebulization every 6 (six) hours as needed. (Patient taking differently: Take 3 mLs by nebulization every 6 (six) hours as needed (shortness of breathing or wheezing).) 360 mL 0   lidocaine (LIDODERM) 5 % Place 1 patch onto the skin daily. Remove & Discard patch within 12 hours or as directed by MD. Apply to low back 30 patch 0    lidocaine-prilocaine (EMLA) cream Apply 1 Application topically as needed. 30 g 0   losartan (COZAAR) 100 MG tablet Take 100 mg by mouth daily.     morphine (MS CONTIN) 15 MG 12 hr tablet Take 1 tablet (15 mg total) by mouth every 12 (twelve) hours. 60 tablet 0   morphine (MSIR) 15 MG tablet Take 1 tablet (15 mg total) by mouth every 6 (six) hours as needed for severe pain. 60 tablet 0   olmesartan (BENICAR) 40 MG tablet Take 40 mg by mouth daily.  ondansetron (ZOFRAN) 8 MG tablet Take 1 tablet (8 mg total) by mouth every 8 (eight) hours as needed. 30 tablet 0   polyethylene glycol (MIRALAX) 17 g packet Take 17 g by mouth daily. 30 each 2   potassium chloride SA (KLOR-CON M) 20 MEQ tablet Take 1 tablet (20 mEq total) by mouth daily. 30 tablet 0   prochlorperazine (COMPAZINE) 10 MG tablet Take 1 tablet (10 mg total) by mouth every 6 (six) hours as needed for nausea or vomiting. 30 tablet 0   senna-docusate (SENNA S) 8.6-50 MG tablet Take 2 tablets by mouth daily. 60 tablet 2   thiamine 100 MG tablet Take 1 tablet (100 mg total) by mouth daily.     No current facility-administered medications for this visit.    REVIEW OF SYSTEMS:   Constitutional: ( - ) fevers, ( - )  chills , ( - ) night sweats Eyes: ( - ) blurriness of vision, ( - ) double vision, ( - ) watery eyes Ears, nose, mouth, throat, and face: ( - ) mucositis, ( - ) sore throat Respiratory: ( - ) cough, ( - ) dyspnea, ( - ) wheezes Cardiovascular: ( - ) palpitation, ( - ) chest discomfort, ( - ) lower extremity swelling Gastrointestinal:  ( - ) nausea, ( - ) heartburn, ( - ) change in bowel habits Skin: ( - ) abnormal skin rashes Lymphatics: ( - ) new lymphadenopathy, ( - ) easy bruising Neurological: ( - ) numbness, ( - ) tingling, ( - ) new weaknesses Behavioral/Psych: ( - ) mood change, ( - ) new changes  All other systems were reviewed with the patient and are negative.  PHYSICAL EXAMINATION: ECOG PERFORMANCE STATUS: 2 -  Symptomatic, <50% confined to bed  Vitals:   07/29/21 0829  BP: 101/77  Pulse: 96  Resp: 16  Temp: 98 F (36.7 C)  SpO2: 100%   Filed Weights   07/29/21 0829  Weight: 148 lb 4.8 oz (67.3 kg)    GENERAL: Well-appearing elderly African-American male, alert, no distress and comfortable SKIN: skin color, texture, turgor are normal, no rashes or significant lesions EYES: conjunctiva are pink and non-injected, sclera clear LUNGS: clear to auscultation and percussion with normal breathing effort HEART: regular rate & rhythm and no murmurs and no lower extremity edema Musculoskeletal: no cyanosis of digits and no clubbing  PSYCH: alert & oriented x 3, fluent speech NEURO: no focal motor/sensory deficits  LABORATORY DATA:  I have reviewed the data as listed    Latest Ref Rng & Units 07/29/2021    7:57 AM 07/15/2021    8:22 AM 07/08/2021    9:26 AM  CBC  WBC 4.0 - 10.5 K/uL 10.6  6.6  14.3   Hemoglobin 13.0 - 17.0 g/dL 9.9  10.3  10.6   Hematocrit 39.0 - 52.0 % 29.5  30.3  31.5   Platelets 150 - 400 K/uL 511  380  453        Latest Ref Rng & Units 07/15/2021    8:22 AM 07/08/2021    9:26 AM 06/20/2021   12:45 PM  CMP  Glucose 70 - 99 mg/dL 100  110  110   BUN 8 - 23 mg/dL 11  8  7    Creatinine 0.61 - 1.24 mg/dL 0.71  0.59  0.68   Sodium 135 - 145 mmol/L 129  132  134   Potassium 3.5 - 5.1 mmol/L 4.1  3.2  3.4   Chloride  98 - 111 mmol/L 95  97  99   CO2 22 - 32 mmol/L 27  29  26    Calcium 8.9 - 10.3 mg/dL 9.8  9.8  9.8   Total Protein 6.5 - 8.1 g/dL 8.3  8.6  8.6   Total Bilirubin 0.3 - 1.2 mg/dL 0.4  0.4  0.5   Alkaline Phos 38 - 126 U/L 99  98  107   AST 15 - 41 U/L 15  13  16    ALT 0 - 44 U/L 13  7  11      Lab Results  Component Value Date   MPROTEIN Not Observed 04/28/2021   Lab Results  Component Value Date   KPAFRELGTCHN 33.6 (H) 04/28/2021   LAMBDASER 22.0 04/28/2021   KAPLAMBRATIO 1.53 04/28/2021    RADIOGRAPHIC STUDIES: DG Shoulder Left  Result Date:  07/17/2021 CLINICAL DATA:  History of metastatic lung cancer, left shoulder pain EXAM: LEFT SHOULDER - 2+ VIEW COMPARISON:  05/02/2021 FINDINGS: Internal rotation, external rotation, and transscapular views of the left shoulder are obtained. There is heterogeneous sclerosis within the lateral margin of the scapula near the glenoid, corresponding to low level radiotracer activity on bone scan, and consistent with bony metastasis. There are no acute displaced fractures. Mild glenohumeral and acromioclavicular joint osteoarthritis. Stable left upper lobe scarring/architectural distortion. Bullous emphysematous changes at the left apex. IMPRESSION: 1. Sclerosis of the left scapula near the glenoid, corresponding to low level radiotracer uptake on recent bone scan, consistent with bony metastatic disease. 2. No acute fracture. Electronically Signed   By: Randa Ngo M.D.   On: 07/17/2021 22:07   IR IMAGING GUIDED PORT INSERTION  Result Date: 06/29/2021 INDICATION: 66 year old male referred for port catheter EXAM: IMAGE GUIDED PORT CATHETER MEDICATIONS: None ANESTHESIA/SEDATION: Moderate (conscious) sedation was employed during this procedure. A total of Versed 2.0 mg and Fentanyl 100 mcg was administered intravenously. Moderate Sedation Time: 20 minutes. The patient's level of consciousness and vital signs were monitored continuously by radiology nursing throughout the procedure under my direct supervision. FLUOROSCOPY TIME:  Fluoroscopy Time:  (1 mGy). COMPLICATIONS: None PROCEDURE: The procedure, risks, benefits, and alternatives were explained to the patient. Questions regarding the procedure were encouraged and answered. The patient understands and consents to the procedure. Ultrasound survey was performed with images stored and sent to PACs. Right IJ vein documented to be patent. The right neck and chest was prepped with chlorhexidine, and draped in the usual sterile fashion using maximum barrier technique  (cap and mask, sterile gown, sterile gloves, large sterile sheet, hand hygiene and cutaneous antiseptic). Local anesthesia was attained by infiltration with 1% lidocaine without epinephrine. Ultrasound demonstrated patency of the right internal jugular vein, and this was documented with an image. Under real-time ultrasound guidance, this vein was accessed with a 21 gauge micropuncture needle and image documentation was performed. A small dermatotomy was made at the access site with an 11 scalpel. A 0.018" wire was advanced into the SVC and used to estimate the length of the internal catheter. The access needle exchanged for a 36F micropuncture vascular sheath. The 0.018" wire was then removed and a 0.035" wire advanced into the IVC. An appropriate location for the subcutaneous reservoir was selected below the clavicle and an incision was made through the skin and underlying soft tissues. The subcutaneous tissues were then dissected using a combination of blunt and sharp surgical technique and a pocket was formed. A single lumen power injectable portacatheter was then tunneled through the  subcutaneous tissues from the pocket to the dermatotomy and the port reservoir placed within the subcutaneous pocket. The venous access site was then serially dilated and a peel away vascular sheath placed over the wire. The wire was removed and the port catheter advanced into position under fluoroscopic guidance. The catheter tip is positioned in the cavoatrial junction. This was documented with a spot image. The portacatheter was then tested and found to flush and aspirate well. The port was flushed with saline followed by 100 units/mL heparinized saline. The pocket was then closed in two layers using first subdermal inverted interrupted absorbable sutures followed by a running subcuticular suture. The epidermis was then sealed with Dermabond. The dermatotomy at the venous access site was also seal with Dermabond. Patient tolerated  the procedure well and remained hemodynamically stable throughout. No complications encountered and no significant blood loss encountered IMPRESSION: Status post right IJ port catheter. Signed, Dulcy Fanny. Nadene Rubins, RPVI [facility] Electronically Signed   By: Corrie Mckusick D.O.   On: 06/29/2021 14:31    ASSESSMENT & PLAN Terry Page. 66 y.o. male with medical history significant for newly diagnosed metastatic adenocarcinoma of the lung who presents for a follow up visit.  # Metastatic Adenocarcinoma of the Lung  -- NGS testing from this patient shows no evidence of targetable mutation. --MRI brain shows no evidence of intracranial metastases -At this time would pursue carboplatinum, pembrolizumab, and pemetrexed as treatment for his cancer. --Can consider targeted radiation therapy for areas causing pain if persistent after adjustment of pain medications and starting treatment Plan: --today is Day 1 Cycle 2 of Carbo/Pem/Pem for metastatic adenocarcinoma of the lung --labs today show white blood cell count 10.6, hemoglobin 9.9, and platelets 511 --RTC in 3 weeks time for next cycle of chemotherapy.   #Pain Control -- Currently prescribed oxycodone 5 mg every 6 hours as needed --Palliative care currently following --Continue senna docusate for constipation prophylaxis --Continue to monitor  #Supportive Care -- chemotherapy education complete -- port placed -- zofran 8mg  q8H PRN and compazine 10mg  PO q6H for nausea -- EMLA cream for port -- Patient referred to palliative care for pain control   No orders of the defined types were placed in this encounter.   All questions were answered. The patient knows to call the clinic with any problems, questions or concerns.  A total of more than 30 minutes were spent on this encounter with face-to-face time and non-face-to-face time, including preparing to see the patient,  ordering tests and/or medications, counseling the patient and coordination of care as outlined above.   Ledell Peoples, MD Department of Hematology/Oncology [place] [facility] Phone: [phone] Pager: [phone] Email: [email or web address]  07/29/2021 8:36 AM

## 2021-07-29 NOTE — Progress Notes (Signed)
Nutrition Assessment   Reason for Assessment: MST   ASSESSMENT: 66 year old male with metastatic adenocarcinoma of lung. He is currently receiving Carbo/Pem/Pem q21d (started 7/7). Patient is under the care of Dr. Lorenso Courier.   Past medical history includes prostate cancer s/p radiation therapy (08/19/19-10/07/19), HTN, hemorrhoids, tobacco abuse, asthma, alcohol abuse, bullous emphysema  Met with patient in infusion. He reports having a great appetite. Patient has four "good meals" plus snacks. Per recall - eggs, sausage, french toast, juice for breakfast, [place] (salami, ham, [place]) sandwich for lunch, cup of soup or peanut butter jelly mid afternoon. He had couple bowls of mac/cheese with hamburger for supper. Reports snacking on soda, chips, pretzels, cookies. He drinks 3 (16oz) sodas and 84 ounces of water. Patient reports espresso in the afternoon to combat drowsiness from pain medications. Patient does not tolerate oral nutrition supplements or milk. He denies nutrition impact symptoms.   Nutrition Focused Physical Exam: no noted depletions   Medications: lipitor, folvite, hydroxyzine, cozaar, morphine, zofran, miralax, klor-con, senna, compazine   Labs: Na 128, glucose 101   Anthropometrics:   Height: 5'8" Weight: 148 lb 4.8 oz  UBW: 166 lb (04/28/21) BMI: 22.55   NUTRITION DIAGNOSIS: Unintentional weight loss related to cancer as evidenced by 11% (18 lb) weight loss in 3 months; significant     INTERVENTION:  Discussed ways to add calories/protein to foods (adding cheese, cooking with butter, adding gravy, creamy sauces) Educated on high calorie high protein snacks - handout with ideas provided  Patient agreeable to bedtime snack   MONITORING, EVALUATION, GOAL: Patient will tolerate increased calories and protein to minimize further weight loss    Next Visit: To be scheduled with treatment plan

## 2021-07-29 NOTE — Patient Instructions (Signed)
[facility]  Discharge Instructions: Thank you for choosing [facility] to provide your oncology and hematology care.   If you have a lab appointment with the [facility], please go directly to the [facility] and check in at the registration area.   Wear comfortable clothing and clothing appropriate for easy access to any Portacath or PICC line.   We strive to give you quality time with your provider. You may need to reschedule your appointment if you arrive late (15 or more minutes).  Arriving late affects you and other patients whose appointments are after yours.  Also, if you miss three or more appointments without notifying the office, you may be dismissed from the clinic at the provider's discretion.      For prescription refill requests, have your pharmacy contact our office and allow 72 hours for refills to be completed.    Today you received the following chemotherapy and/or immunotherapy agents: Keytruda, Alimta, Carboplatin.       To help prevent nausea and vomiting after your treatment, we encourage you to take your nausea medication as directed.  BELOW ARE SYMPTOMS THAT SHOULD BE REPORTED IMMEDIATELY: *FEVER GREATER THAN 100.4 F (38 C) OR HIGHER *CHILLS OR SWEATING *NAUSEA AND VOMITING THAT IS NOT CONTROLLED WITH YOUR NAUSEA MEDICATION *UNUSUAL SHORTNESS OF BREATH *UNUSUAL BRUISING OR BLEEDING *URINARY PROBLEMS (pain or burning when urinating, or frequent urination) *BOWEL PROBLEMS (unusual diarrhea, constipation, pain near the anus) TENDERNESS IN MOUTH AND THROAT WITH OR WITHOUT PRESENCE OF ULCERS (sore throat, sores in mouth, or a toothache) UNUSUAL RASH, SWELLING OR PAIN  UNUSUAL VAGINAL DISCHARGE OR ITCHING   Items with * indicate a potential emergency and should be followed up as soon as possible or go to the Emergency Department if any problems should occur.  Please show the CHEMOTHERAPY ALERT CARD or IMMUNOTHERAPY  ALERT CARD at check-in to the Emergency Department and triage nurse.  Should you have questions after your visit or need to cancel or reschedule your appointment, please contact [facility]  Dept: [phone]  and follow the prompts.  Office hours are 8:00 a.m. to 4:30 p.m. Monday - Friday. Please note that voicemails left after 4:00 p.m. may not be returned until the following business day.  We are closed weekends and major holidays. You have access to a nurse at all times for urgent questions. Please call the main number to the clinic Dept: [phone] and follow the prompts.   For any non-urgent questions, you may also contact your provider using MyChart. We now offer e-Visits for anyone 20 and older to request care online for non-urgent symptoms. For details visit [email or web address].   Also download the MyChart app! Go to the app store, search "MyChart", open the app, select [facility], and log in with your MyChart username and password.  Masks are optional in the cancer centers. If you would like for your care team to wear a mask while they are taking care of you, please let them know. For doctor visits, patients may have with them one support person who is at least 68 years old. At this time, visitors are not allowed in the infusion area.

## 2021-08-01 ENCOUNTER — Telehealth: Payer: Self-pay | Admitting: *Deleted

## 2021-08-01 ENCOUNTER — Other Ambulatory Visit: Payer: Self-pay | Admitting: *Deleted

## 2021-08-01 MED ORDER — FOLIC ACID 1 MG PO TABS: 1.0000 mg | ORAL_TABLET | Freq: Every day | ORAL | 3 refills | Status: DC

## 2021-08-01 MED ORDER — POTASSIUM CHLORIDE CRYS ER 20 MEQ PO TBCR: 20.0000 meq | EXTENDED_RELEASE_TABLET | Freq: Every day | ORAL | 1 refills | Status: DC

## 2021-08-01 NOTE — Telephone Encounter (Signed)
Received vm message from pt asking about his schedule and some refills. TCT patient and spoke with him. Advised that his next visit here is in 3 weeks and that the scheduler is currently working on his schedule today.  He asked for refills on MS Contin 15mg , Folic Acid, and KCL 20 meq. Advised that we will get those refilled for him. He voiced understanding.

## 2021-08-01 NOTE — Telephone Encounter (Signed)
Dr.Dorsey sent in MS Contin refill, on 7/28, I will f/u with pharmacy

## 2021-08-02 ENCOUNTER — Emergency Department (HOSPITAL_COMMUNITY): Payer: Medicare HMO

## 2021-08-02 ENCOUNTER — Encounter (HOSPITAL_COMMUNITY): Payer: Self-pay

## 2021-08-02 ENCOUNTER — Emergency Department (HOSPITAL_COMMUNITY)
Admission: EM | Admit: 2021-08-02 | Discharge: 2021-08-02 | Disposition: A | Discharge status: Home / Self Care | Payer: Medicare HMO | Attending: Emergency Medicine | Admitting: Emergency Medicine

## 2021-08-02 DIAGNOSIS — C3412 Malignant neoplasm of upper lobe, left bronchus or lung: Secondary | ICD-10-CM

## 2021-08-02 DIAGNOSIS — E871 Hypo-osmolality and hyponatremia: Secondary | ICD-10-CM | POA: Insufficient documentation

## 2021-08-02 DIAGNOSIS — D729 Disorder of white blood cells, unspecified: Secondary | ICD-10-CM | POA: Diagnosis not present

## 2021-08-02 DIAGNOSIS — I959 Hypotension, unspecified: Secondary | ICD-10-CM | POA: Diagnosis present

## 2021-08-02 LAB — CBC WITH DIFFERENTIAL/PLATELET
Abs Immature Granulocytes: 0.04 10*3/uL (ref 0.00–0.07)
Basophils Absolute: 0 10*3/uL (ref 0.0–0.1)
Basophils Relative: 0 %
Eosinophils Absolute: 0.4 10*3/uL (ref 0.0–0.5)
Eosinophils Relative: 4 %
HCT: 34.5 % — ABNORMAL LOW (ref 39.0–52.0)
Hemoglobin: 11.2 g/dL — ABNORMAL LOW (ref 13.0–17.0)
Immature Granulocytes: 0 %
Lymphocytes Relative: 10 %
Lymphs Abs: 1.2 10*3/uL (ref 0.7–4.0)
MCH: 28.9 pg (ref 26.0–34.0)
MCHC: 32.5 g/dL (ref 30.0–36.0)
MCV: 88.9 fL (ref 80.0–100.0)
Monocytes Absolute: 0.2 10*3/uL (ref 0.1–1.0)
Monocytes Relative: 1 %
Neutro Abs: 9.6 10*3/uL — ABNORMAL HIGH (ref 1.7–7.7)
Neutrophils Relative %: 85 %
Platelets: 464 10*3/uL — ABNORMAL HIGH (ref 150–400)
RBC: 3.88 MIL/uL — ABNORMAL LOW (ref 4.22–5.81)
RDW: 15.5 % (ref 11.5–15.5)
WBC: 11.4 10*3/uL — ABNORMAL HIGH (ref 4.0–10.5)
nRBC: 0 % (ref 0.0–0.2)

## 2021-08-02 LAB — COMPREHENSIVE METABOLIC PANEL
ALT: 17 U/L (ref 0–44)
AST: 15 U/L (ref 15–41)
Albumin: 4 g/dL (ref 3.5–5.0)
Alkaline Phosphatase: 84 U/L (ref 38–126)
Anion gap: 7 (ref 5–15)
BUN: 22 mg/dL (ref 8–23)
CO2: 20 mmol/L — ABNORMAL LOW (ref 22–32)
Calcium: 9.5 mg/dL (ref 8.9–10.3)
Chloride: 102 mmol/L (ref 98–111)
Creatinine, Ser: 0.73 mg/dL (ref 0.61–1.24)
GFR, Estimated: >60 mL/min (ref >60)
Glucose, Bld: 104 mg/dL — ABNORMAL HIGH (ref 70–99)
Potassium: 5 mmol/L (ref 3.5–5.1)
Sodium: 129 mmol/L — ABNORMAL LOW (ref 135–145)
Total Bilirubin: 0.6 mg/dL (ref 0.3–1.2)
Total Protein: 8.8 g/dL — ABNORMAL HIGH (ref 6.5–8.1)

## 2021-08-02 MED ORDER — SODIUM CHLORIDE 0.9 % IV BOLUS
1000.0000 mL | Freq: Once | INTRAVENOUS | Status: AC
  Administered 2021-08-02: 1000 mL via INTRAVENOUS

## 2021-08-02 NOTE — ED Triage Notes (Signed)
Pt was at PCP for check up.   Manual BP- 87/50 X4  When EMS arrived BP was fine.  96/70, 106/70, and 108 palpated.   Pt has no complaints.   Newly diagnosed Stage 4 bone cancer.   A/Ox4 Ambulatory with cane

## 2021-08-02 NOTE — Discharge Instructions (Signed)
As discussed, your evaluation today has been largely reassuring.  But, it is important that you monitor your condition carefully, and do not hesitate to return to the ED if you develop new, or concerning changes in your condition. ? ?Otherwise, please follow-up with your physician for appropriate ongoing care. ? ?

## 2021-08-02 NOTE — ED Provider Notes (Addendum)
[facility] DEPT Provider Note   CSN: [id number] Arrival date & time: 08/02/21  1231     History  Chief Complaint  Patient presents with   Hypotension    Terry Page. is a 66 y.o. male.  HPI Terry Page is a 66 yo male with history of metastatic adenocarcinoma of lung who presents to the ED after a hypotensive bp reading at a check-up today. Per report, findings were in 70s-80s at office but EMS was able to obtain higher readings. He reports he sometimes measures his bp at home and states they were typically 138/84 but more recently have been around 112/78. He notes they recently changed his medications and notes he does take pain medication for his cancer diagnosis. He denies feeling light-headed, nausea, feeling as though he may faint, or any pain that is beyond his baseline.     Home Medications Prior to Admission medications   Medication Sig Start Date End Date Taking? Authorizing Provider  acetaminophen (TYLENOL) 325 MG tablet Take 2 tablets (650 mg total) by mouth every 6 (six) hours as needed for mild pain or headache (fever >/= 101). 05/10/20   Arrien, Jimmy Picket, MD  albuterol (VENTOLIN HFA) 108 (90 Base) MCG/ACT inhaler Inhale 2 puffs into the lungs every 6 (six) hours as needed for wheezing or shortness of breath. 06/06/19 04/29/22  Elodia Florence., MD  amLODipine (NORVASC) 10 MG tablet Take 1 tablet by mouth daily. 06/14/20   [provider]  atorvastatin (LIPITOR) 20 MG tablet Take 20 mg by mouth daily. 01/28/19   [provider]  folic acid (FOLVITE) 1 MG tablet Take 1 tablet (1 mg total) by mouth daily. 08/01/21   Orson Slick, MD  hydrOXYzine (ATARAX) 25 MG tablet Take 1 tablet (25 mg total) by mouth at bedtime as needed for itching. 06/20/21   Pickenpack-Cousar, Carlena Sax, NP  ipratropium-albuterol (DUONEB) 0.5-2.5 (3) MG/3ML SOLN Take 3 mLs by nebulization every 6 (six) hours as needed. Patient taking  differently: Take 3 mLs by nebulization every 6 (six) hours as needed (shortness of breathing or wheezing). 05/10/20   Arrien, Jimmy Picket, MD  lidocaine (LIDODERM) 5 % Place 1 patch onto the skin daily. Remove & Discard patch within 12 hours or as directed by MD. Apply to low back 05/07/21   Rai, Ripudeep K, MD  lidocaine-prilocaine (EMLA) cream Apply 1 Application topically as needed. 06/21/21   Orson Slick, MD  losartan (COZAAR) 100 MG tablet Take 100 mg by mouth daily. 05/22/21   [provider]  morphine (MS CONTIN) 15 MG 12 hr tablet Take 1 tablet (15 mg total) by mouth every 12 (twelve) hours. 07/29/21   Orson Slick, MD  morphine (MSIR) 15 MG tablet Take 1 tablet (15 mg total) by mouth every 6 (six) hours as needed for severe pain. 07/19/21   Pickenpack-Cousar, Carlena Sax, NP  olmesartan (BENICAR) 40 MG tablet Take 40 mg by mouth daily. 03/03/21   [provider]  ondansetron (ZOFRAN) 8 MG tablet Take 1 tablet (8 mg total) by mouth every 8 (eight) hours as needed. 06/21/21   Orson Slick, MD  polyethylene glycol (MIRALAX) 17 g packet Take 17 g by mouth daily. 06/20/21   Pickenpack-Cousar, Carlena Sax, NP  potassium chloride SA (KLOR-CON M) 20 MEQ tablet Take 1 tablet (20 mEq total) by mouth daily. 08/01/21   Orson Slick, MD  prochlorperazine (COMPAZINE) 10 MG  tablet Take 1 tablet (10 mg total) by mouth every 6 (six) hours as needed for nausea or vomiting. 07/08/21   Orson Slick, MD  senna-docusate (SENNA S) 8.6-50 MG tablet Take 2 tablets by mouth daily. 06/20/21   Pickenpack-Cousar, Carlena Sax, NP  thiamine 100 MG tablet Take 1 tablet (100 mg total) by mouth daily. 05/08/21   Mendel Corning, MD      Allergies    Patient has no known allergies.    Review of Systems   Review of Systems  All other systems reviewed and are negative.   Physical Exam Updated Vital Signs BP 108/87 (BP Location: Right Arm)   Pulse 94   Temp 98.1 F (36.7 C) (Oral)   Resp 16    SpO2 100%  Physical Exam Vitals and nursing note reviewed.  Constitutional:      General: He is not in acute distress.    Appearance: He is well-developed.  HENT:     Head: Normocephalic and atraumatic.  Eyes:     Conjunctiva/sclera: Conjunctivae normal.  Cardiovascular:     Rate and Rhythm: Normal rate and regular rhythm.  Pulmonary:     Effort: Pulmonary effort is normal. No respiratory distress.     Breath sounds: No stridor.  Abdominal:     General: There is no distension.  Skin:    General: Skin is warm and dry.  Neurological:     Mental Status: He is alert and oriented to person, place, and time.     ED Results / Procedures / Treatments   Labs (all labs ordered are listed, but only abnormal results are displayed) Labs Reviewed  COMPREHENSIVE METABOLIC PANEL - Abnormal; Notable for the following components:      Result Value   Sodium 129 (*)    CO2 20 (*)    Glucose, Bld 104 (*)    Total Protein 8.8 (*)    All other components within normal limits  CBC WITH DIFFERENTIAL/PLATELET - Abnormal; Notable for the following components:   WBC 11.4 (*)    RBC 3.88 (*)    Hemoglobin 11.2 (*)    HCT 34.5 (*)    Platelets 464 (*)    Neutro Abs 9.6 (*)    All other components within normal limits    EKG None  Radiology DG Chest 2 View  Result Date: 08/02/2021 CLINICAL DATA:  Hypotension. EXAM: CHEST - 2 VIEW COMPARISON:  May 20, 2020.  April 28, 2021. FINDINGS: The heart size and mediastinal contours are within normal limits. Right internal jugular Port-A-Cath is noted with distal tip in expected position of the SVC. Right lung is unremarkable. Stable scarring and fibrosis is noted in left midlung consistent with prior treatment. The visualized skeletal structures are unremarkable. IMPRESSION: Stable chronic findings are noted in left lung as described above. No acute abnormality is noted. Electronically Signed   By: Marijo Conception M.D.   On: 08/02/2021 14:10     Procedures Procedures    Medications Ordered in ED Medications  sodium chloride 0.9 % bolus 1,000 mL (1,000 mLs Intravenous New Bag/Given 08/02/21 1329)    ED Course/ Medical Decision Making/ A&P This patient with a Hx of left lung cancer currently getting chemotherapy presents to the ED for concern of possible hypotension in the setting of recent chemotherapy, this involves an extensive number of treatment options, and is a complaint that carries with it a high risk of complications and morbidity.    The differential  diagnosis includes neutropenic infection, hypotension, dehydration pneumonia   Social Determinants of Health:  Active cancer  Additional history obtained:  Additional history and/or information obtained from chart review, notable for oncology notes from last week with synopsis of ongoing chemotherapy for left lung cancer   After the initial evaluation, orders, including: X-ray, labs were initiated.   Patient placed on Cardiac and Pulse-Oximetry Monitors. The patient was maintained on a cardiac monitor.  The cardiac monitored showed an rhythm of 95 sinus normal The patient was also maintained on pulse oximetry. The readings were typically 95% room air normal   On repeat evaluation of the patient stayed the same  Lab Tests:  I personally interpreted labs.  The pertinent results include: Mild hyponatremia consistent with prior  Imaging Studies ordered:  I independently visualized and interpreted imaging which showed no pneumonia I agree with the radiologist interpretation   Dispostion / Final MDM:  After consideration of the diagnostic results and the patient's response to treatment, this adult male sent from outpatient clinic due to concern for possible opportunistic infection, given the context of ongoing chemotherapy with possible new hypotension is awake, alert, with no hypoxia, tachypnea, tachycardia, no evidence of bacteremia, sepsis, no pneumonia on  x-ray, labs that are generally reassuring.  Patient comfortable with following up with his physician, discharged in stable condition.  Final Clinical Impression(s) / ED Diagnoses Final diagnoses:  Malignant neoplasm of upper lobe of left lung ([place])  Neutrophilic leukocytosis     Carmin Muskrat, MD 08/02/21 1441

## 2021-08-02 NOTE — ED Notes (Signed)
Pt ambulatory in triage. 

## 2021-08-02 NOTE — ED Notes (Addendum)
Pt refused PTAR. Pt is having his daughter pick him up.

## 2021-08-04 ENCOUNTER — Telehealth: Payer: Self-pay

## 2021-08-04 NOTE — Telephone Encounter (Signed)
Left voicemail reminder of patient's 1:00pm-08/05/21 in-person appointment w/ Ashlyn Bruning PA-C. I left my extension [phone] in case patient needs anything.

## 2021-08-05 ENCOUNTER — Ambulatory Visit
Admission: RE | Admit: 2021-08-05 | Discharge: 2021-08-05 | Disposition: A | Discharge status: Home / Self Care | Payer: Medicare HMO | Source: Ambulatory Visit | Attending: Radiation Oncology | Admitting: Radiation Oncology

## 2021-08-05 ENCOUNTER — Encounter: Payer: Self-pay | Admitting: Urology

## 2021-08-05 ENCOUNTER — Other Ambulatory Visit: Payer: Self-pay

## 2021-08-05 ENCOUNTER — Ambulatory Visit
Admission: RE | Admit: 2021-08-05 | Discharge: 2021-08-05 | Disposition: A | Discharge status: Home / Self Care | Payer: Medicare HMO | Source: Ambulatory Visit | Attending: Urology | Admitting: Urology

## 2021-08-05 VITALS — BP 102/72 | HR 104 | Temp 97.7°F | Resp 20 | Ht 68.0 in | Wt 153.0 lb

## 2021-08-05 DIAGNOSIS — Z923 Personal history of irradiation: Secondary | ICD-10-CM | POA: Insufficient documentation

## 2021-08-05 DIAGNOSIS — C61 Malignant neoplasm of prostate: Secondary | ICD-10-CM | POA: Insufficient documentation

## 2021-08-05 DIAGNOSIS — C3490 Malignant neoplasm of unspecified part of unspecified bronchus or lung: Secondary | ICD-10-CM

## 2021-08-05 DIAGNOSIS — Z510 Encounter for antineoplastic radiation therapy: Secondary | ICD-10-CM | POA: Insufficient documentation

## 2021-08-05 DIAGNOSIS — Z79899 Other long term (current) drug therapy: Secondary | ICD-10-CM | POA: Diagnosis not present

## 2021-08-05 DIAGNOSIS — C3412 Malignant neoplasm of upper lobe, left bronchus or lung: Secondary | ICD-10-CM | POA: Diagnosis present

## 2021-08-05 DIAGNOSIS — C7951 Secondary malignant neoplasm of bone: Secondary | ICD-10-CM | POA: Insufficient documentation

## 2021-08-05 NOTE — Progress Notes (Signed)
Reconsult appointment. I verified patient's identity and began nursing interview. Patient reports LT shoulder pain 7/10. No other issues reported at this time.  Meaningful use complete.  BP 102/72 (BP Location: Left Arm, Patient Position: Sitting, Cuff Size: Normal)   Pulse (!) 104   Temp 97.7 F (36.5 C) (Temporal)   Resp 20   Ht 5\' 8"  (1.727 m)   Wt 153 lb (69.4 kg)   SpO2 100%   BMI 23.26 kg/m

## 2021-08-05 NOTE — Progress Notes (Signed)
Radiation Oncology         [phone]) [phone] ________________________________  Name: Terry Page. MRN: [id number]  Date: 08/05/2021  DOB: Oct 20, 1955  Re-Consultation  CC: [name], MD  Orson Slick, MD  Diagnosis: 66 y.o. 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  Interval Since Last Radiation:  1 year, 4 months 04/08/20 - 04/19/20: Left lung (SBRT x3)- [place]  08/19/19 - 10/07/19:   The prostate was treated to 70 Gy in 28 fractions of 2.5 Gy Tammi Klippel)  04/03/18 - 04/12/18: LUL / 54 Gy in 3 fractions (SBRT x3)- [place]  Narrative: This patient presents today with progressive left shoulder pain secondary to known metastatic NSCLC, adenocarcinoma.  He is well-known to our service, having undergone multiple courses of radiation previously, most recently SBRT to a left upper lobe lung nodule in April 2022.  Unfortunately, he developed osseous metastatic disease in April 2023 with biopsy confirming metastatic lung adenocarcinoma and no targetable mutations.  He had an MRI brain scan that did not show any evidence of metastatic disease.  He is starting chemotherapy with pembrolizumab/pemetrexed/carboplatin with his first cycle on 07/08/2021, under the care of of Dr. Lorenso Courier.  He had his second cycle of systemic therapy on 07/29/2021 and feels like he is tolerating this fairly well.  He has been having progressive left shoulder pain that has not been well controlled with his MS-IR pain medication so he was kindly referred back to [place] to discuss radiation treatment options.  Fortunately, following his second cycle of systemic therapy, he has began to notice some mild improvement in the shoulder pain.  He denies any focal weakness or paresthesias in the upper extremity and denies any neck pain.                   On review of systems, the patient reports that he is doing well overall.  He denies any chest pain, shortness of breath, cough, fevers, chills,  night sweats, or recent unintended weight changes.  He denies any bowel or bladder disturbances, and denies abdominal pain, nausea or vomiting.  He denies any new musculoskeletal or joint aches or pains, aside from that noted in the HPI and denies any new skin lesions or concerns. A complete review of systems is obtained and is otherwise negative.   ALLERGIES:  has No Known Allergies.  Meds: Current Outpatient Medications  Medication Sig Dispense Refill   acetaminophen (TYLENOL) 325 MG tablet Take 2 tablets (650 mg total) by mouth every 6 (six) hours as needed for mild pain or headache (fever >/= 101).     albuterol (VENTOLIN HFA) 108 (90 Base) MCG/ACT inhaler Inhale 2 puffs into the lungs every 6 (six) hours as needed for wheezing or shortness of breath. 8 g 1   amLODipine (NORVASC) 10 MG tablet Take 1 tablet by mouth daily.     atorvastatin (LIPITOR) 20 MG tablet Take 20 mg by mouth daily.     folic acid (FOLVITE) 1 MG tablet Take 1 tablet (1 mg total) by mouth daily. 30 tablet 3   hydrOXYzine (ATARAX) 25 MG tablet Take 1 tablet (25 mg total) by mouth at bedtime as needed for itching. 30 tablet 1   ipratropium-albuterol (DUONEB) 0.5-2.5 (3) MG/3ML SOLN Take 3 mLs by nebulization every 6 (six) hours as needed. (Patient taking differently: Take 3 mLs by nebulization every 6 (six) hours as needed (shortness of breathing or wheezing).) 360 mL 0  lidocaine (LIDODERM) 5 % Place 1 patch onto the skin daily. Remove & Discard patch within 12 hours or as directed by MD. Apply to low back 30 patch 0   lidocaine-prilocaine (EMLA) cream Apply 1 Application topically as needed. 30 g 0   losartan (COZAAR) 100 MG tablet Take 100 mg by mouth daily.     morphine (MS CONTIN) 15 MG 12 hr tablet Take 1 tablet (15 mg total) by mouth every 12 (twelve) hours. 60 tablet 0   morphine (MSIR) 15 MG tablet Take 1 tablet (15 mg total) by mouth every 6 (six) hours as needed for severe pain. 60 tablet 0   olmesartan  (BENICAR) 40 MG tablet Take 40 mg by mouth daily.     ondansetron (ZOFRAN) 8 MG tablet Take 1 tablet (8 mg total) by mouth every 8 (eight) hours as needed. 30 tablet 0   polyethylene glycol (MIRALAX) 17 g packet Take 17 g by mouth daily. 30 each 2   potassium chloride SA (KLOR-CON M) 20 MEQ tablet Take 1 tablet (20 mEq total) by mouth daily. 30 tablet 1   prochlorperazine (COMPAZINE) 10 MG tablet Take 1 tablet (10 mg total) by mouth every 6 (six) hours as needed for nausea or vomiting. 30 tablet 0   senna-docusate (SENNA S) 8.6-50 MG tablet Take 2 tablets by mouth daily. 60 tablet 2   thiamine 100 MG tablet Take 1 tablet (100 mg total) by mouth daily.     No current facility-administered medications for this encounter.    Physical Findings:  height is 5\' 8"  (1.727 m) and weight is 153 lb (69.4 kg). His temporal temperature is 97.7 F (36.5 C). His blood pressure is 102/72 and his pulse is 104 (abnormal). His respiration is 20 and oxygen saturation is 100%.  Pain Assessment Pain Score: 6  (LT shoulder)/ In general this is a well appearing African-American male in no acute distress.  He's alert and oriented x4 and appropriate throughout the examination. Cardiopulmonary assessment is negative for acute distress and he exhibits normal effort.   Lab Findings: Lab Results  Component Value Date   WBC 11.4 (H) 08/02/2021   HGB 11.2 (L) 08/02/2021   HCT 34.5 (L) 08/02/2021   MCV 88.9 08/02/2021   PLT 464 (H) 08/02/2021     Radiographic Findings: DG Chest 2 View  Result Date: 08/02/2021 CLINICAL DATA:  Hypotension. EXAM: CHEST - 2 VIEW COMPARISON:  May 20, 2020.  April 28, 2021. FINDINGS: The heart size and mediastinal contours are within normal limits. Right internal jugular Port-A-Cath is noted with distal tip in expected position of the SVC. Right lung is unremarkable. Stable scarring and fibrosis is noted in left midlung consistent with prior treatment. The visualized skeletal structures are  unremarkable. IMPRESSION: Stable chronic findings are noted in left lung as described above. No acute abnormality is noted. Electronically Signed   By: Marijo Conception M.D.   On: 08/02/2021 14:10   DG Shoulder Left  Result Date: 07/17/2021 CLINICAL DATA:  History of metastatic lung cancer, left shoulder pain EXAM: LEFT SHOULDER - 2+ VIEW COMPARISON:  05/02/2021 FINDINGS: Internal rotation, external rotation, and transscapular views of the left shoulder are obtained. There is heterogeneous sclerosis within the lateral margin of the scapula near the glenoid, corresponding to low level radiotracer activity on bone scan, and consistent with bony metastasis. There are no acute displaced fractures. Mild glenohumeral and acromioclavicular joint osteoarthritis. Stable left upper lobe scarring/architectural distortion. 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 IMPRESSION: 1. Sclerosis of the left scapula near the glenoid, corresponding to low level radiotracer uptake on recent bone scan, consistent with bony metastatic disease. 2. No acute fracture. Electronically Signed   By: Randa Ngo M.D.   On: 07/17/2021 22:07    Impression/Plan: 1. 66 y.o. 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 Today, we talked to the patient about the findings and workup thus far. We discussed the natural history of metastatic NSCLC and general treatment, highlighting the role of radiotherapy in the management. We discussed the available radiation techniques, and focused on the details and logistics of delivery.  The recommendation is for a 2-week course of daily, palliative radiotherapy to the painful disease in the left shoulder.  We reviewed the anticipated acute and late sequelae associated with radiation in this setting. The patient was encouraged to ask questions that were answered to his stated satisfaction.  At the conclusion of our conversation, the patient elects to  proceed with the recommended 2-week course of daily, palliative radiotherapy to the painful disease in the left shoulder.  He has freely signed written consent to proceed today in the office and a copy of this document will be placed in his medical record.  We will share our discussion with Dr. Lorenso Courier and the patient will proceed with CT simulation following our visit today, in anticipation of beginning his treatments next week.  We enjoyed meeting with him again today and look forward to continuing to participate in his care.  He knows that he is welcome to call at anytime in the interim with any questions or concerns related to radiation.   We personally spent 45 minutes in this encounter including chart review, reviewing radiological studies, meeting face-to-face with the patient, entering orders and completing documentation.   Nicholos Johns, PA-C    Tyler Pita, MD  [facility] Direct Dial: [phone]  Fax: [phone] [email or web address]  Skype  [email or web address]   This document serves as a record of services personally performed by Tyler Pita, MD and Freeman Caldron, PA-C. It was created on their behalf by Wilburn Mylar, a trained medical scribe. The creation of this record is based on the scribe's personal observations and the provider's statements to them. This document has been checked and approved by the attending provider.

## 2021-08-06 NOTE — Progress Notes (Signed)
  Radiation Oncology         [phone]) [phone] ________________________________  Name: Terry Page. MRN: [id number]  Date: 08/05/2021  DOB: Jul 02, 1955  SIMULATION AND TREATMENT PLANNING NOTE    ICD-10-CM   1. 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  NARRATIVE:  The patient was brought to the [facility].  Identity was confirmed.  All relevant records and images related to the planned course of therapy were reviewed.  The patient freely provided informed written consent to proceed with treatment after reviewing the details related to the planned course of therapy. The consent form was witnessed and verified by the simulation staff.  Then, the patient was set-up in a stable reproducible  supine position for radiation therapy.  CT images were obtained.  Surface markings were placed.  The CT images were loaded into the planning software.  Then the target and avoidance structures were contoured.  Treatment planning then occurred.  The radiation prescription was entered and confirmed.  Then, I designed and supervised the construction of multiple medically necessary complex treatment devices documented the [facility] planning note.  I have requested : 3D Simulation  I have requested a DVH of the following structures: lungs, spinal cord and target.   PLAN:  The patient will receive 30 Gy in 10 fractions.  ________________________________  Sheral Apley Tammi Klippel, M.D.

## 2021-08-09 DIAGNOSIS — Z510 Encounter for antineoplastic radiation therapy: Secondary | ICD-10-CM | POA: Diagnosis not present

## 2021-08-10 ENCOUNTER — Ambulatory Visit
Admission: RE | Admit: 2021-08-10 | Discharge: 2021-08-10 | Disposition: A | Discharge status: Home / Self Care | Payer: Medicare HMO | Source: Ambulatory Visit | Attending: Pulmonary Disease | Admitting: Pulmonary Disease

## 2021-08-10 DIAGNOSIS — R918 Other nonspecific abnormal finding of lung field: Secondary | ICD-10-CM

## 2021-08-11 ENCOUNTER — Ambulatory Visit
Admission: RE | Admit: 2021-08-11 | Discharge: 2021-08-11 | Disposition: A | Discharge status: Home / Self Care | Payer: Medicare HMO | Source: Ambulatory Visit | Attending: Radiation Oncology | Admitting: Radiation Oncology

## 2021-08-11 ENCOUNTER — Inpatient Hospital Stay: Payer: Medicare HMO | Attending: Hematology and Oncology

## 2021-08-11 ENCOUNTER — Other Ambulatory Visit: Payer: Self-pay

## 2021-08-11 DIAGNOSIS — Z510 Encounter for antineoplastic radiation therapy: Secondary | ICD-10-CM | POA: Diagnosis not present

## 2021-08-11 DIAGNOSIS — E538 Deficiency of other specified B group vitamins: Secondary | ICD-10-CM | POA: Insufficient documentation

## 2021-08-11 DIAGNOSIS — K5900 Constipation, unspecified: Secondary | ICD-10-CM | POA: Insufficient documentation

## 2021-08-11 DIAGNOSIS — G893 Neoplasm related pain (acute) (chronic): Secondary | ICD-10-CM | POA: Insufficient documentation

## 2021-08-11 DIAGNOSIS — C3412 Malignant neoplasm of upper lobe, left bronchus or lung: Secondary | ICD-10-CM | POA: Insufficient documentation

## 2021-08-11 DIAGNOSIS — Z8546 Personal history of malignant neoplasm of prostate: Secondary | ICD-10-CM | POA: Insufficient documentation

## 2021-08-11 DIAGNOSIS — Z923 Personal history of irradiation: Secondary | ICD-10-CM | POA: Insufficient documentation

## 2021-08-11 DIAGNOSIS — Z5112 Encounter for antineoplastic immunotherapy: Secondary | ICD-10-CM | POA: Insufficient documentation

## 2021-08-11 DIAGNOSIS — F1021 Alcohol dependence, in remission: Secondary | ICD-10-CM | POA: Insufficient documentation

## 2021-08-11 DIAGNOSIS — Z79899 Other long term (current) drug therapy: Secondary | ICD-10-CM | POA: Insufficient documentation

## 2021-08-11 DIAGNOSIS — Z87891 Personal history of nicotine dependence: Secondary | ICD-10-CM | POA: Insufficient documentation

## 2021-08-11 DIAGNOSIS — I10 Essential (primary) hypertension: Secondary | ICD-10-CM | POA: Insufficient documentation

## 2021-08-11 DIAGNOSIS — Z5111 Encounter for antineoplastic chemotherapy: Secondary | ICD-10-CM | POA: Insufficient documentation

## 2021-08-11 DIAGNOSIS — Z515 Encounter for palliative care: Secondary | ICD-10-CM | POA: Insufficient documentation

## 2021-08-11 LAB — RAD ONC ARIA SESSION SUMMARY
Course Elapsed Days: 0
Plan Fractions Treated to Date: 1
Plan Prescribed Dose Per Fraction: 3 Gy
Plan Total Fractions Prescribed: 10
Plan Total Prescribed Dose: 30 Gy
Reference Point Dosage Given to Date: 3 Gy
Reference Point Session Dosage Given: 3 Gy
Session Number: 1

## 2021-08-12 ENCOUNTER — Inpatient Hospital Stay: Payer: Medicare HMO

## 2021-08-12 ENCOUNTER — Ambulatory Visit
Admission: RE | Admit: 2021-08-12 | Discharge: 2021-08-12 | Disposition: A | Discharge status: Home / Self Care | Payer: Medicare HMO | Source: Ambulatory Visit | Attending: Radiation Oncology | Admitting: Radiation Oncology

## 2021-08-12 ENCOUNTER — Ambulatory Visit: Payer: Medicare HMO

## 2021-08-12 ENCOUNTER — Telehealth: Payer: Self-pay | Admitting: *Deleted

## 2021-08-12 ENCOUNTER — Other Ambulatory Visit: Payer: Self-pay

## 2021-08-12 DIAGNOSIS — Z510 Encounter for antineoplastic radiation therapy: Secondary | ICD-10-CM | POA: Diagnosis not present

## 2021-08-12 LAB — RAD ONC ARIA SESSION SUMMARY
Course Elapsed Days: 1
Plan Fractions Treated to Date: 2
Plan Prescribed Dose Per Fraction: 3 Gy
Plan Total Fractions Prescribed: 10
Plan Total Prescribed Dose: 30 Gy
Reference Point Dosage Given to Date: 6 Gy
Reference Point Session Dosage Given: 3 Gy
Session Number: 2

## 2021-08-12 NOTE — Telephone Encounter (Signed)
Received call from patient inquiring about having a HCPOA. Reveiwed what a HCPOA is and discussed the needed paperwork. He states he is interested in getting this done. Advised that I would leave the necessary forms at the front desk of the cancer center for him to pick up on Monday. Advised that we can work with the Education officer, museum to have it notarized. Pt voiced understanding.

## 2021-08-15 ENCOUNTER — Telehealth: Payer: Self-pay

## 2021-08-15 ENCOUNTER — Other Ambulatory Visit: Payer: Self-pay

## 2021-08-15 ENCOUNTER — Ambulatory Visit
Admission: RE | Admit: 2021-08-15 | Discharge: 2021-08-15 | Disposition: A | Discharge status: Home / Self Care | Payer: Medicare HMO | Source: Ambulatory Visit | Attending: Radiation Oncology | Admitting: Radiation Oncology

## 2021-08-15 ENCOUNTER — Inpatient Hospital Stay: Payer: Medicare HMO

## 2021-08-15 DIAGNOSIS — Z510 Encounter for antineoplastic radiation therapy: Secondary | ICD-10-CM | POA: Diagnosis not present

## 2021-08-15 LAB — RAD ONC ARIA SESSION SUMMARY
Course Elapsed Days: 4
Plan Fractions Treated to Date: 3
Plan Prescribed Dose Per Fraction: 3 Gy
Plan Total Fractions Prescribed: 10
Plan Total Prescribed Dose: 30 Gy
Reference Point Dosage Given to Date: 9 Gy
Reference Point Session Dosage Given: 3 Gy
Session Number: 3

## 2021-08-15 NOTE — Telephone Encounter (Signed)
CSW phoned patient per the request of RN to provide information regarding the [facility].  Provided education about not needing his daughter to attend the clinic and the notary and witnesses available at the clinic.  CSW provided the contact information to sign up and he stated he understood.  No other needs identified at this time.

## 2021-08-16 ENCOUNTER — Ambulatory Visit
Admission: RE | Admit: 2021-08-16 | Discharge: 2021-08-16 | Disposition: A | Discharge status: Home / Self Care | Payer: Medicare HMO | Source: Ambulatory Visit | Attending: Radiation Oncology | Admitting: Radiation Oncology

## 2021-08-16 ENCOUNTER — Other Ambulatory Visit: Payer: Self-pay

## 2021-08-16 DIAGNOSIS — Z510 Encounter for antineoplastic radiation therapy: Secondary | ICD-10-CM | POA: Diagnosis not present

## 2021-08-16 LAB — RAD ONC ARIA SESSION SUMMARY
Course Elapsed Days: 5
Plan Fractions Treated to Date: 4
Plan Prescribed Dose Per Fraction: 3 Gy
Plan Total Fractions Prescribed: 10
Plan Total Prescribed Dose: 30 Gy
Reference Point Dosage Given to Date: 12 Gy
Reference Point Session Dosage Given: 3 Gy
Session Number: 4

## 2021-08-17 ENCOUNTER — Ambulatory Visit
Admission: RE | Admit: 2021-08-17 | Discharge: 2021-08-17 | Disposition: A | Discharge status: Home / Self Care | Payer: Medicare HMO | Source: Ambulatory Visit | Attending: Radiation Oncology | Admitting: Radiation Oncology

## 2021-08-17 ENCOUNTER — Inpatient Hospital Stay: Payer: Medicare HMO

## 2021-08-17 ENCOUNTER — Other Ambulatory Visit: Payer: Self-pay

## 2021-08-17 DIAGNOSIS — Z510 Encounter for antineoplastic radiation therapy: Secondary | ICD-10-CM | POA: Diagnosis not present

## 2021-08-17 LAB — RAD ONC ARIA SESSION SUMMARY
Course Elapsed Days: 6
Plan Fractions Treated to Date: 5
Plan Prescribed Dose Per Fraction: 3 Gy
Plan Total Fractions Prescribed: 10
Plan Total Prescribed Dose: 30 Gy
Reference Point Dosage Given to Date: 15 Gy
Reference Point Session Dosage Given: 3 Gy
Session Number: 5

## 2021-08-18 ENCOUNTER — Ambulatory Visit
Admission: RE | Admit: 2021-08-18 | Discharge: 2021-08-18 | Disposition: A | Discharge status: Home / Self Care | Payer: Medicare HMO | Source: Ambulatory Visit | Attending: Radiation Oncology | Admitting: Radiation Oncology

## 2021-08-18 ENCOUNTER — Other Ambulatory Visit: Payer: Self-pay

## 2021-08-18 DIAGNOSIS — Z510 Encounter for antineoplastic radiation therapy: Secondary | ICD-10-CM | POA: Diagnosis not present

## 2021-08-18 LAB — RAD ONC ARIA SESSION SUMMARY
Course Elapsed Days: 7
Plan Fractions Treated to Date: 6
Plan Prescribed Dose Per Fraction: 3 Gy
Plan Total Fractions Prescribed: 10
Plan Total Prescribed Dose: 30 Gy
Reference Point Dosage Given to Date: 18 Gy
Reference Point Session Dosage Given: 3 Gy
Session Number: 6

## 2021-08-18 MED FILL — Fosaprepitant Dimeglumine For IV Infusion 150 MG (Base Eq): INTRAVENOUS | Qty: 5 | Status: AC

## 2021-08-18 MED FILL — Dexamethasone Sodium Phosphate Inj 100 MG/10ML: INTRAMUSCULAR | Qty: 1 | Status: AC

## 2021-08-19 ENCOUNTER — Inpatient Hospital Stay: Payer: Medicare HMO

## 2021-08-19 ENCOUNTER — Ambulatory Visit
Admission: RE | Admit: 2021-08-19 | Discharge: 2021-08-19 | Disposition: A | Discharge status: Home / Self Care | Payer: Medicare HMO | Source: Ambulatory Visit | Attending: Radiation Oncology | Admitting: Radiation Oncology

## 2021-08-19 ENCOUNTER — Other Ambulatory Visit: Payer: Self-pay

## 2021-08-19 ENCOUNTER — Telehealth: Payer: Self-pay | Admitting: *Deleted

## 2021-08-19 ENCOUNTER — Inpatient Hospital Stay (HOSPITAL_BASED_OUTPATIENT_CLINIC_OR_DEPARTMENT_OTHER): Payer: Medicare HMO | Admitting: Physician Assistant

## 2021-08-19 ENCOUNTER — Inpatient Hospital Stay (HOSPITAL_BASED_OUTPATIENT_CLINIC_OR_DEPARTMENT_OTHER): Payer: Medicare HMO | Admitting: Nurse Practitioner

## 2021-08-19 ENCOUNTER — Encounter: Payer: Self-pay | Admitting: Nurse Practitioner

## 2021-08-19 VITALS — BP 124/73 | HR 93 | Temp 98.3°F | Resp 15 | Wt 153.0 lb

## 2021-08-19 VITALS — BP 134/83 | HR 86 | Temp 97.9°F | Resp 16

## 2021-08-19 DIAGNOSIS — Z5111 Encounter for antineoplastic chemotherapy: Secondary | ICD-10-CM | POA: Diagnosis present

## 2021-08-19 DIAGNOSIS — G893 Neoplasm related pain (acute) (chronic): Secondary | ICD-10-CM

## 2021-08-19 DIAGNOSIS — Z923 Personal history of irradiation: Secondary | ICD-10-CM | POA: Diagnosis not present

## 2021-08-19 DIAGNOSIS — Z87891 Personal history of nicotine dependence: Secondary | ICD-10-CM | POA: Diagnosis not present

## 2021-08-19 DIAGNOSIS — C3490 Malignant neoplasm of unspecified part of unspecified bronchus or lung: Secondary | ICD-10-CM | POA: Diagnosis not present

## 2021-08-19 DIAGNOSIS — Z515 Encounter for palliative care: Secondary | ICD-10-CM | POA: Diagnosis not present

## 2021-08-19 DIAGNOSIS — C3412 Malignant neoplasm of upper lobe, left bronchus or lung: Secondary | ICD-10-CM

## 2021-08-19 DIAGNOSIS — K5900 Constipation, unspecified: Secondary | ICD-10-CM | POA: Diagnosis not present

## 2021-08-19 DIAGNOSIS — I10 Essential (primary) hypertension: Secondary | ICD-10-CM | POA: Diagnosis not present

## 2021-08-19 DIAGNOSIS — D649 Anemia, unspecified: Secondary | ICD-10-CM | POA: Diagnosis not present

## 2021-08-19 DIAGNOSIS — Z8546 Personal history of malignant neoplasm of prostate: Secondary | ICD-10-CM | POA: Diagnosis not present

## 2021-08-19 DIAGNOSIS — K5903 Drug induced constipation: Secondary | ICD-10-CM

## 2021-08-19 DIAGNOSIS — Z79899 Other long term (current) drug therapy: Secondary | ICD-10-CM | POA: Diagnosis not present

## 2021-08-19 DIAGNOSIS — C7951 Secondary malignant neoplasm of bone: Secondary | ICD-10-CM

## 2021-08-19 DIAGNOSIS — Z510 Encounter for antineoplastic radiation therapy: Secondary | ICD-10-CM | POA: Diagnosis not present

## 2021-08-19 DIAGNOSIS — Z95828 Presence of other vascular implants and grafts: Secondary | ICD-10-CM

## 2021-08-19 DIAGNOSIS — Z5112 Encounter for antineoplastic immunotherapy: Secondary | ICD-10-CM | POA: Diagnosis not present

## 2021-08-19 DIAGNOSIS — E538 Deficiency of other specified B group vitamins: Secondary | ICD-10-CM | POA: Diagnosis not present

## 2021-08-19 DIAGNOSIS — F1021 Alcohol dependence, in remission: Secondary | ICD-10-CM | POA: Diagnosis not present

## 2021-08-19 LAB — RAD ONC ARIA SESSION SUMMARY
Course Elapsed Days: 8
Plan Fractions Treated to Date: 7
Plan Prescribed Dose Per Fraction: 3 Gy
Plan Total Fractions Prescribed: 10
Plan Total Prescribed Dose: 30 Gy
Reference Point Dosage Given to Date: 21 Gy
Reference Point Session Dosage Given: 3 Gy
Session Number: 7

## 2021-08-19 LAB — CBC WITH DIFFERENTIAL (CANCER CENTER ONLY)
Abs Immature Granulocytes: 0.04 10*3/uL (ref 0.00–0.07)
Basophils Absolute: 0 10*3/uL (ref 0.0–0.1)
Basophils Relative: 0 %
Eosinophils Absolute: 0.2 10*3/uL (ref 0.0–0.5)
Eosinophils Relative: 3 %
HCT: 24.9 % — ABNORMAL LOW (ref 39.0–52.0)
Hemoglobin: 8.4 g/dL — ABNORMAL LOW (ref 13.0–17.0)
Immature Granulocytes: 1 %
Lymphocytes Relative: 14 %
Lymphs Abs: 0.9 10*3/uL (ref 0.7–4.0)
MCH: 28.7 pg (ref 26.0–34.0)
MCHC: 33.7 g/dL (ref 30.0–36.0)
MCV: 85 fL (ref 80.0–100.0)
Monocytes Absolute: 1 10*3/uL (ref 0.1–1.0)
Monocytes Relative: 16 %
Neutro Abs: 4 10*3/uL (ref 1.7–7.7)
Neutrophils Relative %: 66 %
Platelet Count: 431 10*3/uL — ABNORMAL HIGH (ref 150–400)
RBC: 2.93 MIL/uL — ABNORMAL LOW (ref 4.22–5.81)
RDW: 15 % (ref 11.5–15.5)
WBC Count: 6.2 10*3/uL (ref 4.0–10.5)
nRBC: 0 % (ref 0.0–0.2)

## 2021-08-19 LAB — CMP (CANCER CENTER ONLY)
ALT: 10 U/L (ref 0–44)
AST: 13 U/L — ABNORMAL LOW (ref 15–41)
Albumin: 3.9 g/dL (ref 3.5–5.0)
Alkaline Phosphatase: 99 U/L (ref 38–126)
Anion gap: 4 — ABNORMAL LOW (ref 5–15)
BUN: 13 mg/dL (ref 8–23)
CO2: 24 mmol/L (ref 22–32)
Calcium: 9.3 mg/dL (ref 8.9–10.3)
Chloride: 100 mmol/L (ref 98–111)
Creatinine: 0.7 mg/dL (ref 0.61–1.24)
GFR, Estimated: >60 mL/min (ref >60)
Glucose, Bld: 114 mg/dL — ABNORMAL HIGH (ref 70–99)
Potassium: 4.4 mmol/L (ref 3.5–5.1)
Sodium: 128 mmol/L — ABNORMAL LOW (ref 135–145)
Total Bilirubin: 0.2 mg/dL — ABNORMAL LOW (ref 0.3–1.2)
Total Protein: 7.7 g/dL (ref 6.5–8.1)

## 2021-08-19 LAB — IRON AND IRON BINDING CAPACITY (CC-WL,HP ONLY)
Iron: 64 ug/dL (ref 45–182)
Saturation Ratios: 21 % (ref 17.9–39.5)
TIBC: 302 ug/dL (ref 250–450)
UIBC: 238 ug/dL (ref 117–376)

## 2021-08-19 LAB — VITAMIN B12: Vitamin B-12: >7500 pg/mL — ABNORMAL HIGH (ref 180–914)

## 2021-08-19 LAB — TSH: TSH: 0.677 u[IU]/mL (ref 0.350–4.500)

## 2021-08-19 LAB — FERRITIN: Ferritin: 402 ng/mL — ABNORMAL HIGH (ref 24–336)

## 2021-08-19 MED ORDER — SODIUM CHLORIDE 0.9 % IV SOLN
470.0000 mg | Freq: Once | INTRAVENOUS | Status: AC
  Administered 2021-08-19: 470 mg via INTRAVENOUS
  Filled 2021-08-19: qty 47

## 2021-08-19 MED ORDER — SODIUM CHLORIDE 0.9 % IV SOLN
10.0000 mg | Freq: Once | INTRAVENOUS | Status: AC
  Administered 2021-08-19: 10 mg via INTRAVENOUS
  Filled 2021-08-19: qty 10

## 2021-08-19 MED ORDER — SODIUM CHLORIDE 0.9% FLUSH
10.0000 mL | Freq: Once | INTRAVENOUS | Status: AC
  Administered 2021-08-19: 10 mL

## 2021-08-19 MED ORDER — SODIUM CHLORIDE 0.9% FLUSH
10.0000 mL | INTRAVENOUS | Status: DC | PRN
  Administered 2021-08-19: 10 mL

## 2021-08-19 MED ORDER — PALONOSETRON HCL INJECTION 0.25 MG/5ML
0.2500 mg | Freq: Once | INTRAVENOUS | Status: AC
  Administered 2021-08-19: 0.25 mg via INTRAVENOUS
  Filled 2021-08-19: qty 5

## 2021-08-19 MED ORDER — CYANOCOBALAMIN 1000 MCG/ML IJ SOLN
1000.0000 ug | Freq: Once | INTRAMUSCULAR | Status: AC
  Administered 2021-08-19: 1000 ug via INTRAMUSCULAR
  Filled 2021-08-19: qty 1

## 2021-08-19 MED ORDER — SODIUM CHLORIDE 0.9 % IV SOLN: Freq: Once | INTRAVENOUS | Status: AC

## 2021-08-19 MED ORDER — SODIUM CHLORIDE 0.9 % IV SOLN
200.0000 mg | Freq: Once | INTRAVENOUS | Status: AC
  Administered 2021-08-19: 200 mg via INTRAVENOUS
  Filled 2021-08-19: qty 200

## 2021-08-19 MED ORDER — SENNOSIDES-DOCUSATE SODIUM 8.6-50 MG PO TABS: 2.0000 | ORAL_TABLET | Freq: Every day | ORAL | 2 refills | Status: DC

## 2021-08-19 MED ORDER — SODIUM CHLORIDE 0.9 % IV SOLN
150.0000 mg | Freq: Once | INTRAVENOUS | Status: AC
  Administered 2021-08-19: 150 mg via INTRAVENOUS
  Filled 2021-08-19: qty 150

## 2021-08-19 MED ORDER — HEPARIN SOD (PORK) LOCK FLUSH 100 UNIT/ML IV SOLN
500.0000 [IU] | Freq: Once | INTRAVENOUS | Status: AC | PRN
  Administered 2021-08-19: 500 [IU]

## 2021-08-19 MED ORDER — SODIUM CHLORIDE 0.9 % IV SOLN
500.0000 mg/m2 | Freq: Once | INTRAVENOUS | Status: AC
  Administered 2021-08-19: 900 mg via INTRAVENOUS
  Filled 2021-08-19: qty 20

## 2021-08-19 NOTE — Telephone Encounter (Signed)
Patient called. Said [name] anti-itch creme worked better for him than hydrocortisone creme for itching. He asked for a prescription for [name]. Advised him that a prescription is not needed as Terry Page is OTC. Encouraged him to ask pharmacy to check his insurance since some Medicare Advantage Plans provide coverage for or a discount for certain OTC products. He verbalized understanding and expressed appreciation for information.

## 2021-08-19 NOTE — Progress Notes (Signed)
[place] [phone]   [phone]  PROGRESS NOTE  Patient Care Team: [name], MD as PCP - General (Family Medicine)  Hematological/Oncological History # Metastatic Adenocarcinoma of the Lung  01/07/2020 : CT of the chest with contrast performed which showed interval enlargement of a spiculated nodule in the central left upper lobe measuring 1.2 x 1.0 cm and highly suspicious for primary lung malignancy 02/06/2020 :PET scan performed which showed a hypermetabolic irregular solid 1.3 cm left upper lobe pulmonary nodule compatible with malignancy without any other hypermetabolic lesions 04/04/5954- 04/19/2020:  SBRT to the lung lesion 04/28/2021: CT C/A/P showed multifocal lytic bone metastases are identified. Lesion within the spine of the left scapula and lesions involving bilateral iliac bones and left sacral wing. Establish care with Dr. Lorenso Courier  05/03/2021: biopsy of right iliac lytic lesion showed metastatic carcinoma, consistent with lung primary.  07/08/2021: Cycle 1 Day 1 of Carbo/Pem/Pem 07/29/2021: Cycle 2 Day 1 of Carbo/Pem/Pem 08/11/2021: Started palliative radiation to osseous metastasis in the left shoulder. 30 Gy in 10 fractions.  08/19/2021: Cycle 3 Day 1 of Carbo/Pem/Pem  #Adenocarcinoma of the Prostate, [id number] 08/19/2019-10/07/2019: 70 Gy in 28 fractions of 2.5 Gy.  Radiation to the prostate was under the care of Dr. Tyler Pita  Interval History:  Terry Page. 66 y.o. male with medical history significant for metastatic adenocarcinoma of the lung who presents for a follow up visit. The patient's last visit was on 07/29/2021. In the interim since the last visit he has completed Cycle 2 of chemotherapy and started palliative radiation to left shoulder osseous metastasis.   On exam today Terry Page reports that he is feeling quite well.  His energy levels and appetite are stable and he is continue to complete all his daily activities on  his own.  He reports improvement of left shoulder pain and rates his pain as 6 out of 10 on a pain scale.  He has decreased intake of his pain medication.  He no longer takes short acting morphine.  He currently takes MS Contin 15 mg every 12 hours and Tylenol as needed for breakthrough pain.  He endorses better mobility and strength in the left upper extremity.  He denies any GI symptoms including nausea, vomiting, diarrhea or constipation.  He denies easy bruising or signs of active bleeding.  Patient has stable shortness of breath secondary to COPD.  He denies fevers, chills, night sweats, chest pain, cough, headaches, dizziness, peripheral edema or neuropathy.  He has no other complaints. A full 10 point ROS is listed below.  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 Gery Pray   Incidental lung nodule, greater than or equal to 3mm 10/22/2017   Left upper lobe - discovered on CTA   Prostate cancer ([place])    Spontaneous pneumothorax 10/20/2017   right   Tobacco abuse     SURGICAL HISTORY: Past Surgical History:  Procedure Laterality Date   BACK SURGERY     IR IMAGING GUIDED PORT INSERTION  06/29/2021   PROSTATE BIOPSY      SOCIAL HISTORY: Social History   Socioeconomic History   Marital status: Single    Spouse name: Not on file   Number of children: Not on file   Years  of education: Not on file   Highest education level: Not on file  Occupational History   Occupation: retired  Tobacco Use   Smoking status: Former    Types: Cigarettes    Quit date: 11/22/2019    Years since quitting: 1.7   Smokeless tobacco: Never   Tobacco comments:    Patient reports quit 3 years ago. 06/25/20. [facility]  Vaping Use   Vaping Use: Never used  Substance and Sexual Activity   Alcohol use: Not Currently     Alcohol/week: 12.0 standard drinks of alcohol    Types: 12 Cans of beer per week    Comment: daily   Drug use: No   Sexual activity: Not Currently  Other Topics Concern   Not on file  Social History Narrative   Not on file   Social Determinants of Health   Financial Resource Strain: Not on file  Food Insecurity: Not on file  Transportation Needs: Unmet Transportation Needs (07/18/2021)   PRAPARE - Hydrologist (Medical): Yes    Lack of Transportation (Non-Medical): Yes  Physical Activity: Not on file  Stress: Not on file  Social Connections: Not on file  Intimate Partner Violence: Not on file    FAMILY HISTORY: Family History  Problem Relation Age of Onset   Cancer Cousin        maternal cousin   Cancer Cousin        paternal cousin   Cancer Cousin    Colon polyps Neg Hx    Pancreatic disease Neg Hx    Pancreatic cancer Neg Hx    Breast cancer Neg Hx    Colon cancer Neg Hx     ALLERGIES:  has No Known Allergies.  MEDICATIONS:  Current Outpatient Medications  Medication Sig Dispense Refill   acetaminophen (TYLENOL) 325 MG tablet Take 2 tablets (650 mg total) by mouth every 6 (six) hours as needed for mild pain or headache (fever >/= 101).     albuterol (VENTOLIN HFA) 108 (90 Base) MCG/ACT inhaler Inhale 2 puffs into the lungs every 6 (six) hours as needed for wheezing or shortness of breath. 8 g 1   amLODipine (NORVASC) 10 MG tablet Take 1 tablet by mouth daily.     atorvastatin (LIPITOR) 20 MG tablet Take 20 mg by mouth daily.     docusate sodium (COLACE) 100 MG capsule Take 100 mg by mouth 2 (two) times daily.     folic acid (FOLVITE) 1 MG tablet Take 1 tablet (1 mg total) by mouth daily. 30 tablet 3   hydrOXYzine (ATARAX) 25 MG tablet Take 1 tablet (25 mg total) by mouth at bedtime as needed for itching. 30 tablet 1   ipratropium-albuterol (DUONEB) 0.5-2.5 (3) MG/3ML SOLN Take 3 mLs by nebulization every 6 (six) hours as needed.  (Patient taking differently: Take 3 mLs by nebulization every 6 (six) hours as needed (shortness of breathing or wheezing).) 360 mL 0   lidocaine (LIDODERM) 5 % Place 1 patch onto the skin daily. Remove & Discard patch within 12 hours or as directed by MD. Apply to low back 30 patch 0   lidocaine-prilocaine (EMLA) cream Apply 1 Application topically as needed. 30 g 0   losartan (COZAAR) 100 MG tablet Take 100 mg by mouth daily.     morphine (MS CONTIN) 15 MG 12 hr tablet Take 1 tablet (15 mg total) by mouth every 12 (twelve) hours. 60 tablet 0   morphine (MSIR) 15 MG  tablet Take 1 tablet (15 mg total) by mouth every 6 (six) hours as needed for severe pain. 60 tablet 0   olmesartan (BENICAR) 40 MG tablet Take 40 mg by mouth daily.     ondansetron (ZOFRAN) 8 MG tablet Take 1 tablet (8 mg total) by mouth every 8 (eight) hours as needed. 30 tablet 0   oxyCODONE (OXY IR/ROXICODONE) 5 MG immediate release tablet 1 tablet as needed     potassium chloride SA (KLOR-CON M) 20 MEQ tablet Take 1 tablet (20 mEq total) by mouth daily. 30 tablet 1   prochlorperazine (COMPAZINE) 10 MG tablet Take 1 tablet (10 mg total) by mouth every 6 (six) hours as needed for nausea or vomiting. 30 tablet 0   thiamine 100 MG tablet Take 1 tablet (100 mg total) by mouth daily.     polyethylene glycol (MIRALAX) 17 g packet Take 17 g by mouth daily. (Patient not taking: Reported on 08/19/2021) 30 each 2   senna-docusate (SENNA S) 8.6-50 MG tablet Take 2 tablets by mouth daily. 60 tablet 2   No current facility-administered medications for this visit.   Facility-Administered Medications Ordered in Other Visits  Medication Dose Route Frequency Provider Last Rate Last Admin   CARBOplatin (PARAPLATIN) 470 mg in sodium chloride 0.9 % 250 mL chemo infusion  470 mg Intravenous Once Orson Slick, MD       fosaprepitant (EMEND) 150 mg in sodium chloride 0.9 % 145 mL IVPB  150 mg Intravenous Once Ledell Peoples IV, MD 450 mL/hr at  08/19/21 1001 150 mg at 08/19/21 1001   heparin lock flush 100 unit/mL  500 Units Intracatheter Once PRN Orson Slick, MD       pembrolizumab [facility]) 200 mg in sodium chloride 0.9 % 50 mL chemo infusion  200 mg Intravenous Once Orson Slick, MD       PEMEtrexed (ALIMTA) 900 mg in sodium chloride 0.9 % 100 mL chemo infusion  500 mg/m2 (Treatment Plan Recorded) Intravenous Once Orson Slick, MD       sodium chloride flush (NS) 0.9 % injection 10 mL  10 mL Intracatheter PRN Orson Slick, MD        REVIEW OF SYSTEMS:   Constitutional: ( - ) fevers, ( - )  chills , ( - ) night sweats Eyes: ( - ) blurriness of vision, ( - ) double vision, ( - ) watery eyes Ears, nose, mouth, throat, and face: ( - ) mucositis, ( - ) sore throat Respiratory: ( - ) cough, ( - ) dyspnea, ( - ) wheezes Cardiovascular: ( - ) palpitation, ( - ) chest discomfort, ( - ) lower extremity swelling Gastrointestinal:  ( - ) nausea, ( - ) heartburn, ( - ) change in bowel habits Skin: ( - ) abnormal skin rashes Lymphatics: ( - ) new lymphadenopathy, ( - ) easy bruising Neurological: ( - ) numbness, ( - ) tingling, ( - ) new weaknesses Behavioral/Psych: ( - ) mood change, ( - ) new changes  All other systems were reviewed with the patient and are negative.  PHYSICAL EXAMINATION: ECOG PERFORMANCE STATUS: 1 - Symptomatic but completely ambulatory  Vitals:   08/19/21 0902  BP: 124/73  Pulse: 93  Resp: 15  Temp: 98.3 F (36.8 C)  SpO2: 99%    Filed Weights   08/19/21 0902  Weight: 153 lb (69.4 kg)     GENERAL: Well-appearing elderly African-American male, alert,  no distress and comfortable SKIN: skin color, texture, turgor are normal, no rashes or significant lesions EYES: conjunctiva are pink and non-injected, sclera clear LUNGS: clear to auscultation and percussion with normal breathing effort HEART: regular rate & rhythm and no murmurs and no lower extremity edema Musculoskeletal: no  cyanosis of digits and no clubbing  PSYCH: alert & oriented x 3, fluent speech NEURO: no focal motor/sensory deficits  LABORATORY DATA:  I have reviewed the data as listed    Latest Ref Rng & Units 08/19/2021    8:40 AM 08/02/2021    1:24 PM 07/29/2021    7:57 AM  CBC  WBC 4.0 - 10.5 K/uL 6.2  11.4  10.6   Hemoglobin 13.0 - 17.0 g/dL 8.4  11.2  9.9   Hematocrit 39.0 - 52.0 % 24.9  34.5  29.5   Platelets 150 - 400 K/uL 431  464  511        Latest Ref Rng & Units 08/19/2021    8:40 AM 08/02/2021    1:24 PM 07/29/2021    7:57 AM  CMP  Glucose 70 - 99 mg/dL 114  104  101   BUN 8 - 23 mg/dL 13  22  21    Creatinine 0.61 - 1.24 mg/dL 0.70  0.73  0.91   Sodium 135 - 145 mmol/L 128  129  128   Potassium 3.5 - 5.1 mmol/L 4.4  5.0  5.0   Chloride 98 - 111 mmol/L 100  102  100   CO2 22 - 32 mmol/L 24  20  22    Calcium 8.9 - 10.3 mg/dL 9.3  9.5  9.7   Total Protein 6.5 - 8.1 g/dL 7.7  8.8  8.8   Total Bilirubin 0.3 - 1.2 mg/dL 0.2  0.6  0.3   Alkaline Phos 38 - 126 U/L 99  84  89   AST 15 - 41 U/L 13  15  14    ALT 0 - 44 U/L 10  17  10      Lab Results  Component Value Date   MPROTEIN Not Observed 04/28/2021   Lab Results  Component Value Date   KPAFRELGTCHN 33.6 (H) 04/28/2021   LAMBDASER 22.0 04/28/2021   KAPLAMBRATIO 1.53 04/28/2021    RADIOGRAPHIC STUDIES: CT Chest Wo Contrast  Result Date: 08/10/2021 CLINICAL DATA:  Lung nodule, 6-93mm EXAM: CT CHEST WITHOUT CONTRAST TECHNIQUE: Multidetector CT imaging of the chest was performed following the standard protocol without IV contrast. RADIATION DOSE REDUCTION: This exam was performed according to the departmental dose-optimization program which includes automated exposure control, adjustment of the mA and/or kV according to patient size and/or use of iterative reconstruction technique. COMPARISON:  April 28, 2021 FINDINGS: Cardiovascular: Heart and mediastinal contours are stable. Again seen are the moderately severe atheromatous  calcifications of the arch of the aorta. No pericardial effusion. Mediastinum/Nodes: No enlarged mediastinal or axillary lymph nodes. Thyroid gland, and trachea demonstrate no significant findings. Small hiatal hernia. Lungs/Pleura: As before, again seen are the paraseptal and centrilobular emphysematous changes. There are emphysematous bullae seen at the left upper lobe without significant interval change. Again seen are the retraction fibrosis and architectural distortion within the central left upper lobe corresponding to the treated tumor site. The treated tumor site shows central nodular density measuring 1.1 x 0.8 cm (image 52/5) without significant interval change. There is nodular density seen along the margin of the left upper lobe bronchus measuring 8 mm x 6 mm (image 67/5) and is  stable as well. No new discrete nodule or mass seen. No consolidation, pleural effusion or pneumothorax seen. Upper Abdomen: Stable 2.3 cm cyst in the visualized right kidney. Musculoskeletal: Moderate thoracic spondylosis. There is a destructive bony lesion seen at the spinous process of the left scapula and the destructive process has extended into the superior margin neck and then into the bony acetabulum and has significantly increased in the interim. The lytic lesions are also seen at the upper 1/3 of the lateral border of the scapula. IMPRESSION: 1. Retraction fibrosis and architectural distortion in the central left upper lobe corresponding to the treated tumor site. Central nodular density at the treated tumor site measures 1.1 x 0.8 cm without significant interval change. 2. Nodular density along the margin of the left upper lobe bronchus measures 8 mm x 6 mm and is stable as well. 3. There are lytic destructive lesions seen in the spine of the scapula extending into the superior margin, neck of the scapula and the bony acetabulum. Lytic lesions are also seen in the upper third of the lateral margin of the scapula. These  lytic lesions have significantly progressed in the interim in comparison to just the left scapular spine in the previous study. Electronically Signed   By: Frazier Richards M.D.   On: 08/10/2021 16:11   DG Chest 2 View  Result Date: 08/02/2021 CLINICAL DATA:  Hypotension. EXAM: CHEST - 2 VIEW COMPARISON:  May 20, 2020.  April 28, 2021. FINDINGS: The heart size and mediastinal contours are within normal limits. Right internal jugular Port-A-Cath is noted with distal tip in expected position of the SVC. Right lung is unremarkable. Stable scarring and fibrosis is noted in left midlung consistent with prior treatment. The visualized skeletal structures are unremarkable. IMPRESSION: Stable chronic findings are noted in left lung as described above. No acute abnormality is noted. Electronically Signed   By: Marijo Conception M.D.   On: 08/02/2021 14:10    [place]. 65 y.o. male with medical history significant for newly diagnosed metastatic adenocarcinoma of the lung who presents for a follow up visit.  # Metastatic Adenocarcinoma of the Lung  -- NGS testing from this patient shows no evidence of targetable mutation. --MRI brain shows no evidence of intracranial metastases --Started carboplatin, pembrolizumab, and pemetrexed as treatment for his cancer on 07/08/2021. --Started palliative radiation to osseous metastasis of the left shoulder on 08/11/2021. Planned dose is 30 Gy in 10 fx.  Plan: --today is Day 1 Cycle 3 of Carbo/Pem/Pem for metastatic adenocarcinoma of the lung --labs from today were reviewed and adequate for treatment. WBC 6.2, hemoglobin 8.4, and platelets 431 --RTC in 3 weeks time port labs, f/u visit with Dr. Lorenso Courier and Cycle 4, Day 1 of chemotherapy  #Normocytic anemia: --Likely secondary to chemotherapy --Hgb 8.4 today, decreased from 11.2 on 08/02/2021.  --Patient is not complaining of fatigue or worsening SOB. Monitor closely --Checking iron, B12 and folate  levels today  #Pain Control--improving -- Currently taking MS Contin 15 mg q 12 hours and tylenol for breakthrough pain. No requiring MSIR at this time.  --Palliative care currently following --Continue senna docusate for constipation prophylaxis --Continue to monitor  #Supportive Care -- chemotherapy education complete -- port placed -- zofran 8mg  q8H PRN and compazine 10mg  PO q6H for nausea -- EMLA cream for port -- Patient referred to palliative care for pain control   Orders Placed This Encounter  Procedures   Iron and Iron Binding  Capacity ([facility] only)    Standing Status:   Future    Standing Expiration Date:   08/20/2022   Ferritin    Standing Status:   Future    Standing Expiration Date:   08/20/2022   Vitamin B12    Standing Status:   Future    Standing Expiration Date:   08/19/2022   Methylmalonic acid, serum    Standing Status:   Future    Standing Expiration Date:   08/19/2022   Folate, Serum    Standing Status:   Future    Standing Expiration Date:   08/19/2022    All questions were answered. The patient knows to call the clinic with any problems, questions or concerns.  I have spent a total of 30 minutes minutes of face-to-face and non-face-to-face time, preparing to see the patient, performing a medically appropriate examination, counseling and educating the patient, referring and communicating with other health care professionals, documenting clinical information in the electronic health record, and care coordination.   Dede Query PA-C Dept of Hematology and [place] at [facility] Phone: [phone]   08/19/2021 10:09 AM

## 2021-08-19 NOTE — Patient Instructions (Signed)
[facility] [facility]  Discharge Instructions: Thank you for choosing [facility] to provide your oncology and hematology care.   If you have a lab appointment with the [facility], please go directly to the [facility] and check in at the registration area.   Wear comfortable clothing and clothing appropriate for easy access to any Portacath or PICC line.   We strive to give you quality time with your provider. You may need to reschedule your appointment if you arrive late (15 or more minutes).  Arriving late affects you and other patients whose appointments are after yours.  Also, if you miss three or more appointments without notifying the office, you may be dismissed from the clinic at the provider's discretion.      For prescription refill requests, have your pharmacy contact our office and allow 72 hours for refills to be completed.    Today you received the following chemotherapy and/or immunotherapy agents: Keytruda, Alimta, Carboplatin.       To help prevent nausea and vomiting after your treatment, we encourage you to take your nausea medication as directed.  BELOW ARE SYMPTOMS THAT SHOULD BE REPORTED IMMEDIATELY: *FEVER GREATER THAN 100.4 F (38 C) OR HIGHER *CHILLS OR SWEATING *NAUSEA AND VOMITING THAT IS NOT CONTROLLED WITH YOUR NAUSEA MEDICATION *UNUSUAL SHORTNESS OF BREATH *UNUSUAL BRUISING OR BLEEDING *URINARY PROBLEMS (pain or burning when urinating, or frequent urination) *BOWEL PROBLEMS (unusual diarrhea, constipation, pain near the anus) TENDERNESS IN MOUTH AND THROAT WITH OR WITHOUT PRESENCE OF ULCERS (sore throat, sores in mouth, or a toothache) UNUSUAL RASH, SWELLING OR PAIN  UNUSUAL VAGINAL DISCHARGE OR ITCHING   Items with * indicate a potential emergency and should be followed up as soon as possible or go to the Emergency Department if any problems should occur.  Please show the CHEMOTHERAPY ALERT CARD or IMMUNOTHERAPY  ALERT CARD at check-in to the Emergency Department and triage nurse.  Should you have questions after your visit or need to cancel or reschedule your appointment, please contact [facility]  Dept: [phone]  and follow the prompts.  Office hours are 8:00 a.m. to 4:30 p.m. Monday - Friday. Please note that voicemails left after 4:00 p.m. may not be returned until the following business day.  We are closed weekends and major holidays. You have access to a nurse at all times for urgent questions. Please call the main number to the clinic Dept: [phone] and follow the prompts.   For any non-urgent questions, you may also contact your provider using MyChart. We now offer e-Visits for anyone 45 and older to request care online for non-urgent symptoms. For details visit [email or web address].   Also download the MyChart app! Go to the app store, search "MyChart", open the app, select [facility], and log in with your MyChart username and password.  Masks are optional in the cancer centers. If you would like for your care team to wear a mask while they are taking care of you, please let them know. For doctor visits, patients may have with them one support person who is at least 68 years old. At this time, visitors are not allowed in the infusion area.

## 2021-08-19 NOTE — Progress Notes (Signed)
i    [place]  [phone] [phone]   Name: Terry Page. Date: 08/19/2021 MRN: [id number]  DOB: 08/13/1955  Patient Care Team: [name], MD as PCP - General (Family Medicine)     INTERVAL HISTORY: Terry Page. is a 66 y.o. 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  Palliative ask to see for symptom management.  SOCIAL HISTORY:     reports that he quit smoking about 20 months ago. His smoking use included cigarettes. He has never used smokeless tobacco. He reports that he does not currently use alcohol after a past usage of about 12.0 standard drinks of alcohol per week. He reports that he does not use drugs.  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 Gery Pray   Incidental lung nodule, greater than or equal to 71mm 10/22/2017   Left upper lobe - discovered on CTA   Prostate cancer ([place])    Spontaneous pneumothorax 10/20/2017   right   Tobacco abuse     ALLERGIES:  has No Known Allergies.  MEDICATIONS:  Current Outpatient Medications  Medication Sig Dispense Refill   acetaminophen (TYLENOL) 325 MG tablet Take 2 tablets (650 mg total) by mouth every 6 (six) hours as needed for mild pain or headache (fever >/= 101).     albuterol (VENTOLIN HFA) 108 (90 Base) MCG/ACT inhaler Inhale 2 puffs into the lungs every 6 (six) hours as needed for wheezing or shortness of breath. 8 g 1   amLODipine (NORVASC) 10 MG tablet Take 1 tablet by mouth daily.     atorvastatin (LIPITOR) 20 MG tablet Take 20 mg by  mouth daily.     docusate sodium (COLACE) 100 MG capsule Take 100 mg by mouth 2 (two) times daily.     folic acid (FOLVITE) 1 MG tablet Take 1 tablet (1 mg total) by mouth daily. 30 tablet 3   hydrOXYzine (ATARAX) 25 MG tablet Take 1 tablet (25 mg total) by mouth at bedtime as needed for itching. 30 tablet 1   ipratropium-albuterol (DUONEB) 0.5-2.5 (3) MG/3ML SOLN Take 3 mLs by nebulization every 6 (six) hours as needed. (Patient taking differently: Take 3 mLs by nebulization every 6 (six) hours as needed (shortness of breathing or wheezing).) 360 mL 0   lidocaine (LIDODERM) 5 % Place 1 patch onto the skin daily. Remove & Discard patch within 12 hours or as directed by MD. Apply to low back 30 patch 0   lidocaine-prilocaine (EMLA) cream Apply 1 Application topically as needed. 30 g 0   losartan (COZAAR) 100 MG tablet Take 100 mg by mouth daily.     morphine (MS CONTIN) 15 MG 12 hr tablet Take 1 tablet (15 mg total) by mouth every 12 (twelve) hours. 60 tablet 0   morphine (MSIR) 15 MG tablet Take 1 tablet (15 mg total) by mouth every 6 (six) hours as needed for severe pain. 60 tablet 0   olmesartan (BENICAR) 40 MG tablet Take 40 mg by mouth daily.     ondansetron (ZOFRAN) 8 MG  tablet Take 1 tablet (8 mg total) by mouth every 8 (eight) hours as needed. 30 tablet 0   oxyCODONE (OXY IR/ROXICODONE) 5 MG immediate release tablet 1 tablet as needed     polyethylene glycol (MIRALAX) 17 g packet Take 17 g by mouth daily. (Patient not taking: Reported on 08/19/2021) 30 each 2   potassium chloride SA (KLOR-CON M) 20 MEQ tablet Take 1 tablet (20 mEq total) by mouth daily. 30 tablet 1   prochlorperazine (COMPAZINE) 10 MG tablet Take 1 tablet (10 mg total) by mouth every 6 (six) hours as needed for nausea or vomiting. 30 tablet 0   senna-docusate (SENNA S) 8.6-50 MG tablet Take 2 tablets by mouth daily. 60 tablet 2   thiamine 100 MG tablet Take 1 tablet (100 mg total) by mouth daily.     No current  facility-administered medications for this visit.   Facility-Administered Medications Ordered in Other Visits  Medication Dose Route Frequency Provider Last Rate Last Admin   CARBOplatin (PARAPLATIN) 470 mg in sodium chloride 0.9 % 250 mL chemo infusion  470 mg Intravenous Once Ledell Peoples IV, MD       heparin lock flush 100 unit/mL  500 Units Intracatheter Once PRN Orson Slick, MD       PEMEtrexed (ALIMTA) 900 mg in sodium chloride 0.9 % 100 mL chemo infusion  500 mg/m2 (Treatment Plan Recorded) Intravenous Once Ledell Peoples IV, MD       sodium chloride flush (NS) 0.9 % injection 10 mL  10 mL Intracatheter PRN Orson Slick, MD        VITAL SIGNS: There were no vitals taken for this visit. There were no vitals filed for this visit.  Estimated body mass index is 23.26 kg/m as calculated from the following:   Height as of 08/05/21: 5\' 8"  (1.727 m).   Weight as of an earlier encounter on 08/19/21: 153 lb (69.4 kg).   PERFORMANCE STATUS (ECOG) : 1 - Symptomatic but completely ambulatory   IMPRESSION: Terry Page by phone for symptom management follow-up. Denies any acute distress. Denies nausea, vomiting, or diarrhea. He is feeling much better. Is able to lift both arms without significant pain and his activity level has greatly improved. He is tolerating treatment and radiation.    Neoplasm related pain  Pain is well controlled on current regimen. He is tolerating MS Contin twice daily. Is no longer requiring MS IR.   I am glad his pain is much improved. We discussed plans to begin weaning down off of the MS Contin in the near future as he continue to show improvement.   We will continue to closely monitor.   Constipation  Constipation is improved. He is taking Miralax and Senna as recommended.   I discussed the importance of continued conversation with family and their medical providers regarding overall plan of care and treatment options, ensuring decisions are within the  context of the patients values and GOCs.  PLAN: Continue with MS Contin 15mg  twice daily. MS IR 15 mg no longer requiring  Miralax/Senna daily  I will plan to see patient back in 3-4 weeks in collaboration with his other oncology appointments. Will give him a call next week for close symptom follow-up.    Patient expressed understanding and was in agreement with this plan. He also understands that He can call the clinic at any time with any questions, concerns, or complaints.      Any controlled substances utilized  were prescribed in the context of palliative care. PDMP has been reviewed.    Time Total: 20 min   Visit consisted of counseling and education dealing with the complex and emotionally intense issues of symptom management and palliative care in the setting of serious and potentially life-threatening illness.Greater than 50%  of this time was spent counseling and coordinating care related to the above assessment and plan.  Alda Lea, AGPCNP-BC  Palliative Medicine Team/[facility] [place]

## 2021-08-22 ENCOUNTER — Other Ambulatory Visit: Payer: Self-pay

## 2021-08-22 ENCOUNTER — Ambulatory Visit
Admission: RE | Admit: 2021-08-22 | Discharge: 2021-08-22 | Disposition: A | Discharge status: Home / Self Care | Payer: Medicare HMO | Source: Ambulatory Visit | Attending: Radiation Oncology | Admitting: Radiation Oncology

## 2021-08-22 DIAGNOSIS — Z510 Encounter for antineoplastic radiation therapy: Secondary | ICD-10-CM | POA: Diagnosis not present

## 2021-08-22 LAB — RAD ONC ARIA SESSION SUMMARY
Course Elapsed Days: 11
Plan Fractions Treated to Date: 8
Plan Prescribed Dose Per Fraction: 3 Gy
Plan Total Fractions Prescribed: 10
Plan Total Prescribed Dose: 30 Gy
Reference Point Dosage Given to Date: 24 Gy
Reference Point Session Dosage Given: 3 Gy
Session Number: 8

## 2021-08-23 ENCOUNTER — Ambulatory Visit
Admission: RE | Admit: 2021-08-23 | Discharge: 2021-08-23 | Disposition: A | Discharge status: Home / Self Care | Payer: Medicare HMO | Source: Ambulatory Visit | Attending: Radiation Oncology | Admitting: Radiation Oncology

## 2021-08-23 ENCOUNTER — Other Ambulatory Visit: Payer: Self-pay

## 2021-08-23 ENCOUNTER — Ambulatory Visit: Payer: Medicare HMO

## 2021-08-23 DIAGNOSIS — Z510 Encounter for antineoplastic radiation therapy: Secondary | ICD-10-CM | POA: Diagnosis not present

## 2021-08-23 LAB — RAD ONC ARIA SESSION SUMMARY
Course Elapsed Days: 12
Plan Fractions Treated to Date: 9
Plan Prescribed Dose Per Fraction: 3 Gy
Plan Total Fractions Prescribed: 10
Plan Total Prescribed Dose: 30 Gy
Reference Point Dosage Given to Date: 27 Gy
Reference Point Session Dosage Given: 3 Gy
Session Number: 9

## 2021-08-24 ENCOUNTER — Ambulatory Visit
Admission: RE | Admit: 2021-08-24 | Discharge: 2021-08-24 | Disposition: A | Discharge status: Home / Self Care | Payer: Medicare HMO | Source: Ambulatory Visit | Attending: Radiation Oncology | Admitting: Radiation Oncology

## 2021-08-24 ENCOUNTER — Ambulatory Visit: Payer: Medicare HMO

## 2021-08-24 ENCOUNTER — Encounter: Payer: Self-pay | Admitting: Urology

## 2021-08-24 ENCOUNTER — Other Ambulatory Visit: Payer: Self-pay

## 2021-08-24 ENCOUNTER — Inpatient Hospital Stay: Payer: Medicare HMO

## 2021-08-24 DIAGNOSIS — Z510 Encounter for antineoplastic radiation therapy: Secondary | ICD-10-CM | POA: Diagnosis not present

## 2021-08-24 DIAGNOSIS — C7951 Secondary malignant neoplasm of bone: Secondary | ICD-10-CM

## 2021-08-24 LAB — RAD ONC ARIA SESSION SUMMARY
Course Elapsed Days: 13
Plan Fractions Treated to Date: 10
Plan Prescribed Dose Per Fraction: 3 Gy
Plan Total Fractions Prescribed: 10
Plan Total Prescribed Dose: 30 Gy
Reference Point Dosage Given to Date: 30 Gy
Reference Point Session Dosage Given: 3 Gy
Session Number: 10

## 2021-09-01 ENCOUNTER — Ambulatory Visit: Payer: Medicare HMO | Admitting: Pulmonary Disease

## 2021-09-01 ENCOUNTER — Encounter: Payer: Self-pay | Admitting: Pulmonary Disease

## 2021-09-01 VITALS — BP 108/64 | HR 99 | Ht 68.0 in | Wt 153.0 lb

## 2021-09-01 DIAGNOSIS — R918 Other nonspecific abnormal finding of lung field: Secondary | ICD-10-CM | POA: Diagnosis not present

## 2021-09-01 DIAGNOSIS — J439 Emphysema, unspecified: Secondary | ICD-10-CM

## 2021-09-01 DIAGNOSIS — J9383 Other pneumothorax: Secondary | ICD-10-CM

## 2021-09-01 DIAGNOSIS — C3490 Malignant neoplasm of unspecified part of unspecified bronchus or lung: Secondary | ICD-10-CM | POA: Diagnosis not present

## 2021-09-01 DIAGNOSIS — C7951 Secondary malignant neoplasm of bone: Secondary | ICD-10-CM

## 2021-09-01 MED ORDER — HYDROXYZINE HCL 25 MG PO TABS: 25.0000 mg | ORAL_TABLET | Freq: Every evening | ORAL | 11 refills | Status: DC | PRN

## 2021-09-01 NOTE — Patient Instructions (Signed)
Thank you for visiting Dr. Valeta Harms at [facility]. Today we recommend the following:  Call us if symptoms change or shortness of breath worsens.   Return in about 1 year (around 09/02/2022), or if symptoms worsen or fail to improve, for with APP or Dr. Valeta Harms.    Please do your part to reduce the spread of COVID-19.

## 2021-09-01 NOTE — Progress Notes (Signed)
Synopsis: Referred in June 2022 for hospital follow-up, infected bullous disease, PCP: By [name], MD  Subjective:   PATIENT ID: Terry Page. GENDER: male DOB: 05-26-1955, MRN: [id number]  Chief Complaint  Patient presents with   Follow-up    [name], past medical history of alcohol use, bullous emphysema, history of spontaneous pneumothorax on the right, previous chest tube placed by Dr. Roxy Manns.  He also has 2 nodules on the left due to the bullous lung disease did not have biopsy but did receive empiric SBRT treatments by radiation oncology.  He completed these treatments in April of this past year.  Patient was admitted to the hospital in mid April and discharged in May 2022.Patient's hospitalization was related to a right upper lobe pneumonia with associated infectious bullous disease.  Since hospitalization he is doing well.  He was discharged with orders for a repeat noncontrasted CT scan of the chest.  This has not been completed yet.  Her respiratory standpoint he feels short of breath with exertion.  Not on any maintenance inhalers.  No longer having hemoptysis.  Previous CT scan reveals persistence of a spiculated left upper lobe nodule status post SBRT.  This looks stable.  We reviewed his images today in the office.  OV 10/11/2020: Here today for follow-up after recent CT scan of the chest.  He was treated empirically left upper lobe 2 pulmonary lesions concerning for malignancy no previous biopsy or diagnosis of malignancy.  He has the area that has slowly enlarged some within the left upper lobe.  Reviewed CT scan today in the office.  OV 09/01/2021: Here today for follow-up.  Relatively complex history empiric treatments for 2 pulmonary lesions with SBRT.Last CT scan of the chest on 08/10/2021 here today for follow-up.  He has evidence of retraction fibrosis and architectural destruction from his left upper lobe treatments.  He has a lytic destructive  lesion in this spine of the scapula.,  Neck of the scapula and bony acetabulum.  They have increased in size.  Last seen by medical oncology on 08/19/2021.  Undergoing treatments for metastatic adenocarcinoma of the lung.  He is also seeing palliative care.  Has itching related to treatments.  Was on hydroxyzine in the past.  Needs refills today.    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 Gery Pray   Incidental lung nodule, greater than or equal to 32mm 10/22/2017   Left upper lobe - discovered on CTA   Prostate cancer ([place])    Spontaneous pneumothorax 10/20/2017   right   Tobacco abuse      Family History  Problem Relation Age of Onset   Cancer Cousin        maternal cousin   Cancer Cousin        paternal cousin   Cancer Cousin    Colon polyps Neg Hx    Pancreatic disease Neg Hx    Pancreatic cancer Neg Hx    Breast cancer Neg Hx    Colon cancer Neg Hx      Past Surgical History:  Procedure Laterality Date   BACK SURGERY     IR IMAGING GUIDED PORT INSERTION  06/29/2021   PROSTATE BIOPSY      Social History   Socioeconomic  History   Marital status: Single    Spouse name: Not on file   Number of children: Not on file   Years of education: Not on file   Highest education level: Not on file  Occupational History   Occupation: retired  Tobacco Use   Smoking status: Former    Types: Cigarettes    Quit date: 11/22/2019    Years since quitting: 1.7   Smokeless tobacco: Never   Tobacco comments:    Patient reports quit 3 years ago. 06/25/20. [facility]  Vaping Use   Vaping Use: Never used  Substance and Sexual Activity   Alcohol use: Not Currently    Alcohol/week: 12.0 standard drinks of alcohol    Types: 12 Cans of beer per week    Comment: daily   Drug use: No   Sexual activity: Not Currently   Other Topics Concern   Not on file  Social History Narrative   Not on file   Social Determinants of Health   Financial Resource Strain: Not on file  Food Insecurity: Not on file  Transportation Needs: Unmet Transportation Needs (07/18/2021)   PRAPARE - Hydrologist (Medical): Yes    Lack of Transportation (Non-Medical): Yes  Physical Activity: Not on file  Stress: Not on file  Social Connections: Not on file  Intimate Partner Violence: Not on file     No Known Allergies   Outpatient Medications Prior to Visit  Medication Sig Dispense Refill   albuterol (VENTOLIN HFA) 108 (90 Base) MCG/ACT inhaler Inhale 2 puffs into the lungs every 6 (six) hours as needed for wheezing or shortness of breath. 8 g 1   ipratropium-albuterol (DUONEB) 0.5-2.5 (3) MG/3ML SOLN Take 3 mLs by nebulization every 6 (six) hours as needed. (Patient taking differently: Take 3 mLs by nebulization every 6 (six) hours as needed (shortness of breathing or wheezing).) 360 mL 0   acetaminophen (TYLENOL) 325 MG tablet Take 2 tablets (650 mg total) by mouth every 6 (six) hours as needed for mild pain or headache (fever >/= 101).     amLODipine (NORVASC) 10 MG tablet Take 1 tablet by mouth daily.     atorvastatin (LIPITOR) 20 MG tablet Take 20 mg by mouth daily.     docusate sodium (COLACE) 100 MG capsule Take 100 mg by mouth 2 (two) times daily.     folic acid (FOLVITE) 1 MG tablet Take 1 tablet (1 mg total) by mouth daily. 30 tablet 3   lidocaine (LIDODERM) 5 % Place 1 patch onto the skin daily. Remove & Discard patch within 12 hours or as directed by MD. Apply to low back 30 patch 0   lidocaine-prilocaine (EMLA) cream Apply 1 Application topically as needed. 30 g 0   losartan (COZAAR) 100 MG tablet Take 100 mg by mouth daily.     morphine (MS CONTIN) 15 MG 12 hr tablet Take 1 tablet (15 mg total) by mouth every 12 (twelve) hours. 60 tablet 0   morphine (MSIR) 15 MG tablet Take 1 tablet (15  mg total) by mouth every 6 (six) hours as needed for severe pain. 60 tablet 0   olmesartan (BENICAR) 40 MG tablet Take 40 mg by mouth daily.     ondansetron (ZOFRAN) 8 MG tablet Take 1 tablet (8 mg total) by mouth every 8 (eight) hours as needed. 30 tablet 0   oxyCODONE (OXY IR/ROXICODONE) 5 MG immediate release tablet 1 tablet as needed     polyethylene  glycol (MIRALAX) 17 g packet Take 17 g by mouth daily. (Patient not taking: Reported on 08/19/2021) 30 each 2   potassium chloride SA (KLOR-CON M) 20 MEQ tablet Take 1 tablet (20 mEq total) by mouth daily. 30 tablet 1   prochlorperazine (COMPAZINE) 10 MG tablet Take 1 tablet (10 mg total) by mouth every 6 (six) hours as needed for nausea or vomiting. 30 tablet 0   senna-docusate (SENNA S) 8.6-50 MG tablet Take 2 tablets by mouth daily. 60 tablet 2   thiamine 100 MG tablet Take 1 tablet (100 mg total) by mouth daily.     hydrOXYzine (ATARAX) 25 MG tablet Take 1 tablet (25 mg total) by mouth at bedtime as needed for itching. 30 tablet 1   No facility-administered medications prior to visit.    Review of Systems  Constitutional:  Negative for chills, fever, malaise/fatigue and weight loss.  HENT:  Negative for hearing loss, sore throat and tinnitus.   Eyes:  Negative for blurred vision and double vision.  Respiratory:  Positive for shortness of breath. Negative for cough, hemoptysis, sputum production, wheezing and stridor.   Cardiovascular:  Negative for chest pain, palpitations, orthopnea, leg swelling and PND.  Gastrointestinal:  Negative for abdominal pain, constipation, diarrhea, heartburn, nausea and vomiting.  Genitourinary:  Negative for dysuria, hematuria and urgency.  Musculoskeletal:  Negative for joint pain and myalgias.  Skin:  Negative for itching and rash.  Neurological:  Negative for dizziness, tingling, weakness and headaches.  Endo/Heme/Allergies:  Negative for environmental allergies. Does not bruise/bleed easily.   Psychiatric/Behavioral:  Negative for depression. The patient is not nervous/anxious and does not have insomnia.   All other systems reviewed and are negative.    Objective:  Physical Exam Vitals reviewed.  Constitutional:      General: He is not in acute distress.    Appearance: He is well-developed.  HENT:     Head: Normocephalic and atraumatic.     Mouth/Throat:     Pharynx: No oropharyngeal exudate.  Eyes:     Conjunctiva/sclera: Conjunctivae normal.     Pupils: Pupils are equal, round, and reactive to light.  Neck:     Vascular: No JVD.     Trachea: No tracheal deviation.     Comments: Loss of supraclavicular fat Cardiovascular:     Rate and Rhythm: Normal rate and regular rhythm.     Heart sounds: S1 normal and S2 normal.     Comments: Distant heart tones Pulmonary:     Effort: No tachypnea or accessory muscle usage.     Breath sounds: No stridor. No wheezing, rhonchi or rales. Decreased breath sounds: throughout all lung fields.    Comments: Diminished breath sounds bilaterally Abdominal:     General: There is no distension.     Palpations: Abdomen is soft.     Tenderness: There is no abdominal tenderness.  Musculoskeletal:        General: Deformity (muscle wasting ) present.  Skin:    General: Skin is warm and dry.     Capillary Refill: Capillary refill takes less than 2 seconds.     Findings: No rash.  Neurological:     Mental Status: He is alert and oriented to person, place, and time.  Psychiatric:        Behavior: Behavior normal.      Vitals:   09/01/21 1103  BP: 108/64  Pulse: 99  SpO2: 98%  Weight: 153 lb (69.4 kg)  Height: 5\' 8"  (1.727 m)  98% on RA BMI Readings from Last 3 Encounters:  09/01/21 23.26 kg/m  08/19/21 23.26 kg/m  08/05/21 23.26 kg/m   Wt Readings from Last 3 Encounters:  09/01/21 153 lb (69.4 kg)  08/19/21 153 lb (69.4 kg)  08/05/21 153 lb (69.4 kg)    CBC    Component Value Date/Time   WBC 6.2 08/19/2021 0840    WBC 11.4 (H) 08/02/2021 1324   RBC 2.93 (L) 08/19/2021 0840   HGB 8.4 (L) 08/19/2021 0840   HCT 24.9 (L) 08/19/2021 0840   PLT 431 (H) 08/19/2021 0840   MCV 85.0 08/19/2021 0840   MCH 28.7 08/19/2021 0840   MCHC 33.7 08/19/2021 0840   RDW 15.0 08/19/2021 0840   LYMPHSABS 0.9 08/19/2021 0840   MONOABS 1.0 08/19/2021 0840   EOSABS 0.2 08/19/2021 0840   BASOSABS 0.0 08/19/2021 0840    Chest Imaging: 04/25/2020: Right upper lobe bullous disease air-fluid levels consistent with fluid collection loculation and likely infected bullae. Left upper lobe spiculated nodule stable. The patient's images have been independently reviewed by me.    September 2022 CT chest: Patient has enlarging areas within the left upper lobe that was previously treated for SBRT. Unsure if this is recurrence of malignancy or not still has significant bullous emphysema. The patient's images have been independently reviewed by me.   08/10/2021 CT chest: Retraction fibrosis.  Previous treated tumor site no significant change. Destructive lesions in the scapula. The patient's images have been independently reviewed by me.    Pulmonary Functions Testing Results:    Latest Ref Rng & Units 03/12/2018    8:13 AM  PFT Results  FVC-Pre L 2.49   FVC-Predicted Pre % 65   FVC-Post L 2.87   FVC-Predicted Post % 75   Pre FEV1/FVC % % 51   Post FEV1/FCV % % 51   FEV1-Pre L 1.27   FEV1-Predicted Pre % 43   FEV1-Post L 1.46   DLCO uncorrected ml/min/mmHg 10.43   DLCO UNC% % 39   DLVA Predicted % 62   TLC L 5.95   TLC % Predicted % 87   RV % Predicted % 150     FeNO:   Pathology:   Echocardiogram:   Heart Catheterization:     Assessment & Plan:     ICD-10-CM   1. 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 Lung nodules  R91.8     3. Bullous emphysema ([name])  J43.9     4. h/o Spontaneous pneumothorax  J93.83        Discussion:  [name],  history of necrotizing pneumonia, significant bullous disease, history of spontaneous pneumothorax, had SBRT treatments to 2 left upper lobe pulmonary nodules with no biopsy concerning for malignancy ultimately developed lytic lesions within the bone and scapula.  Currently getting treated for stage IV lung cancer.  Being seen by radiation oncology and medical oncology  Plan: Continue follow-up with oncology services. Currently not using a maintenance inhaler. He does not really want to be on a maintenance inhaler using as needed albuterol. From a breathing standpoint he feels like he is doing okay. He does need a refill of his hydroxyzine that has been using for itching. We will happy to fill that today. He can follow-up with [place] in 1 year or as needed.  He had his image follow-up and surveillance to be set up by medical oncology.  Current Outpatient Medications:    albuterol (VENTOLIN HFA) 108 (90 Base) MCG/ACT inhaler, Inhale 2 puffs into the lungs every 6 (six) hours as needed for wheezing or shortness of breath., Disp: 8 g, Rfl: 1   ipratropium-albuterol (DUONEB) 0.5-2.5 (3) MG/3ML SOLN, Take 3 mLs by nebulization every 6 (six) hours as needed. (Patient taking differently: Take 3 mLs by nebulization every 6 (six) hours as needed (shortness of breathing or wheezing).), Disp: 360 mL, Rfl: 0   acetaminophen (TYLENOL) 325 MG tablet, Take 2 tablets (650 mg total) by mouth every 6 (six) hours as needed for mild pain or headache (fever >/= 101)., Disp: , Rfl:    amLODipine (NORVASC) 10 MG tablet, Take 1 tablet by mouth daily., Disp: , Rfl:    atorvastatin (LIPITOR) 20 MG tablet, Take 20 mg by mouth daily., Disp: , Rfl:    docusate sodium (COLACE) 100 MG capsule, Take 100 mg by mouth 2 (two) times daily., Disp: , Rfl:    folic acid (FOLVITE) 1 MG tablet, Take 1 tablet (1 mg total) by mouth daily., Disp: 30 tablet, Rfl: 3   hydrOXYzine (ATARAX) 25 MG tablet, Take 1 tablet (25 mg total) by mouth at  bedtime as needed for itching., Disp: 30 tablet, Rfl: 11   lidocaine (LIDODERM) 5 %, Place 1 patch onto the skin daily. Remove & Discard patch within 12 hours or as directed by MD. Apply to low back, Disp: 30 patch, Rfl: 0   lidocaine-prilocaine (EMLA) cream, Apply 1 Application topically as needed., Disp: 30 g, Rfl: 0   losartan (COZAAR) 100 MG tablet, Take 100 mg by mouth daily., Disp: , Rfl:    morphine (MS CONTIN) 15 MG 12 hr tablet, Take 1 tablet (15 mg total) by mouth every 12 (twelve) hours., Disp: 60 tablet, Rfl: 0   morphine (MSIR) 15 MG tablet, Take 1 tablet (15 mg total) by mouth every 6 (six) hours as needed for severe pain., Disp: 60 tablet, Rfl: 0   olmesartan (BENICAR) 40 MG tablet, Take 40 mg by mouth daily., Disp: , Rfl:    ondansetron (ZOFRAN) 8 MG tablet, Take 1 tablet (8 mg total) by mouth every 8 (eight) hours as needed., Disp: 30 tablet, Rfl: 0   oxyCODONE (OXY IR/ROXICODONE) 5 MG immediate release tablet, 1 tablet as needed, Disp: , Rfl:    polyethylene glycol (MIRALAX) 17 g packet, Take 17 g by mouth daily. (Patient not taking: Reported on 08/19/2021), Disp: 30 each, Rfl: 2   potassium chloride SA (KLOR-CON M) 20 MEQ tablet, Take 1 tablet (20 mEq total) by mouth daily., Disp: 30 tablet, Rfl: 1   prochlorperazine (COMPAZINE) 10 MG tablet, Take 1 tablet (10 mg total) by mouth every 6 (six) hours as needed for nausea or vomiting., Disp: 30 tablet, Rfl: 0   senna-docusate (SENNA S) 8.6-50 MG tablet, Take 2 tablets by mouth daily., Disp: 60 tablet, Rfl: 2   thiamine 100 MG tablet, Take 1 tablet (100 mg total) by mouth daily., Disp: , Rfl:     [name], DO [facility] 09/01/2021 11:32 AM

## 2021-09-05 ENCOUNTER — Other Ambulatory Visit: Payer: Self-pay | Admitting: Hematology and Oncology

## 2021-09-05 DIAGNOSIS — C3412 Malignant neoplasm of upper lobe, left bronchus or lung: Secondary | ICD-10-CM

## 2021-09-05 NOTE — Progress Notes (Signed)
ON PATHWAY REGIMEN - Non-Small Cell Lung  No Change  Continue With Treatment as Ordered.  Original Decision Date/Time: 06/21/2021 15:10     A cycle is every 21 days:     Pembrolizumab      Pemetrexed      Carboplatin   **Always confirm dose/schedule in your pharmacy ordering system**  Patient Characteristics: Stage IV Metastatic, Nonsquamous, Molecular Analysis Completed, Molecular Alteration Present and Targeted Therapy Exhausted OR EGFR Exon 20+ or KRAS G12C+ or HER2+ Present and No Prior Chemo/Immunotherapy OR No Alteration Present, Initial  Chemotherapy/Immunotherapy, PS = 2, No Alteration Present, No Alteration Present, PD-L1 Expression Positive 1-49% (TPS) / Negative / Not Tested / Awaiting Test Results Therapeutic Status: Stage IV Metastatic Histology: Nonsquamous Cell Broad Molecular Profiling Status: Molecular Analysis Completed Molecular Analysis Results: No Alteration Present ECOG Performance Status: 2 Chemotherapy/Immunotherapy Line of Therapy: Initial Chemotherapy/Immunotherapy EGFR Exons 18-21 Mutation Testing Status: Completed and Negative ALK Fusion/Rearrangement Testing Status: Completed and Negative BRAF V600 Mutation Testing Status: Completed and Negative KRAS G12C Mutation Testing Status: Completed and Negative MET Exon 14 Mutation Testing Status: Completed and Negative RET Fusion/Rearrangement Testing Status: Completed and Negative HER2 Mutation Testing Status: Completed and Negative NTRK Fusion/Rearrangement Testing Status: Completed and Negative ROS1 Fusion/Rearrangement Testing Status: Completed and Negative PD-L1 Expression Status: PD-L1 Positive 1-49% (TPS) Intent of Therapy: Non-Curative / Palliative Intent, Discussed with Patient

## 2021-09-09 ENCOUNTER — Inpatient Hospital Stay: Payer: Medicare HMO

## 2021-09-09 ENCOUNTER — Inpatient Hospital Stay (HOSPITAL_BASED_OUTPATIENT_CLINIC_OR_DEPARTMENT_OTHER): Payer: Medicare HMO | Admitting: Hematology and Oncology

## 2021-09-09 ENCOUNTER — Inpatient Hospital Stay (HOSPITAL_BASED_OUTPATIENT_CLINIC_OR_DEPARTMENT_OTHER): Payer: Medicare HMO | Admitting: Nurse Practitioner

## 2021-09-09 ENCOUNTER — Inpatient Hospital Stay: Payer: Medicare HMO | Admitting: Dietician

## 2021-09-09 ENCOUNTER — Other Ambulatory Visit: Payer: Self-pay

## 2021-09-09 ENCOUNTER — Inpatient Hospital Stay: Payer: Medicare HMO | Attending: Hematology and Oncology

## 2021-09-09 ENCOUNTER — Encounter: Payer: Self-pay | Admitting: Nurse Practitioner

## 2021-09-09 VITALS — BP 135/80 | HR 78 | Temp 97.9°F | Resp 15 | Wt 149.9 lb

## 2021-09-09 DIAGNOSIS — K5903 Drug induced constipation: Secondary | ICD-10-CM | POA: Diagnosis not present

## 2021-09-09 DIAGNOSIS — Z79899 Other long term (current) drug therapy: Secondary | ICD-10-CM | POA: Insufficient documentation

## 2021-09-09 DIAGNOSIS — F1021 Alcohol dependence, in remission: Secondary | ICD-10-CM | POA: Insufficient documentation

## 2021-09-09 DIAGNOSIS — G893 Neoplasm related pain (acute) (chronic): Secondary | ICD-10-CM

## 2021-09-09 DIAGNOSIS — C7951 Secondary malignant neoplasm of bone: Secondary | ICD-10-CM

## 2021-09-09 DIAGNOSIS — C3490 Malignant neoplasm of unspecified part of unspecified bronchus or lung: Secondary | ICD-10-CM

## 2021-09-09 DIAGNOSIS — Z5112 Encounter for antineoplastic immunotherapy: Secondary | ICD-10-CM | POA: Insufficient documentation

## 2021-09-09 DIAGNOSIS — K5900 Constipation, unspecified: Secondary | ICD-10-CM | POA: Diagnosis not present

## 2021-09-09 DIAGNOSIS — Z5111 Encounter for antineoplastic chemotherapy: Secondary | ICD-10-CM | POA: Insufficient documentation

## 2021-09-09 DIAGNOSIS — Z515 Encounter for palliative care: Secondary | ICD-10-CM

## 2021-09-09 DIAGNOSIS — Z8546 Personal history of malignant neoplasm of prostate: Secondary | ICD-10-CM | POA: Insufficient documentation

## 2021-09-09 DIAGNOSIS — C3412 Malignant neoplasm of upper lobe, left bronchus or lung: Secondary | ICD-10-CM

## 2021-09-09 DIAGNOSIS — Z87891 Personal history of nicotine dependence: Secondary | ICD-10-CM | POA: Insufficient documentation

## 2021-09-09 DIAGNOSIS — Z923 Personal history of irradiation: Secondary | ICD-10-CM | POA: Diagnosis not present

## 2021-09-09 DIAGNOSIS — I10 Essential (primary) hypertension: Secondary | ICD-10-CM | POA: Diagnosis not present

## 2021-09-09 DIAGNOSIS — Z95828 Presence of other vascular implants and grafts: Secondary | ICD-10-CM

## 2021-09-09 LAB — CMP (CANCER CENTER ONLY)
ALT: 11 U/L (ref 0–44)
AST: 15 U/L (ref 15–41)
Albumin: 4.1 g/dL (ref 3.5–5.0)
Alkaline Phosphatase: 84 U/L (ref 38–126)
Anion gap: 7 (ref 5–15)
BUN: 12 mg/dL (ref 8–23)
CO2: 23 mmol/L (ref 22–32)
Calcium: 9.8 mg/dL (ref 8.9–10.3)
Chloride: 101 mmol/L (ref 98–111)
Creatinine: 0.93 mg/dL (ref 0.61–1.24)
GFR, Estimated: >60 mL/min (ref >60)
Glucose, Bld: 108 mg/dL — ABNORMAL HIGH (ref 70–99)
Potassium: 4.3 mmol/L (ref 3.5–5.1)
Sodium: 131 mmol/L — ABNORMAL LOW (ref 135–145)
Total Bilirubin: 0.2 mg/dL — ABNORMAL LOW (ref 0.3–1.2)
Total Protein: 8.8 g/dL — ABNORMAL HIGH (ref 6.5–8.1)

## 2021-09-09 LAB — TSH: TSH: 1.057 u[IU]/mL (ref 0.350–4.500)

## 2021-09-09 LAB — CBC WITH DIFFERENTIAL (CANCER CENTER ONLY)
Abs Immature Granulocytes: 0.06 10*3/uL (ref 0.00–0.07)
Basophils Absolute: 0 10*3/uL (ref 0.0–0.1)
Basophils Relative: 0 %
Eosinophils Absolute: 0.3 10*3/uL (ref 0.0–0.5)
Eosinophils Relative: 4 %
HCT: 24.7 % — ABNORMAL LOW (ref 39.0–52.0)
Hemoglobin: 8.6 g/dL — ABNORMAL LOW (ref 13.0–17.0)
Immature Granulocytes: 1 %
Lymphocytes Relative: 13 %
Lymphs Abs: 1 10*3/uL (ref 0.7–4.0)
MCH: 29.6 pg (ref 26.0–34.0)
MCHC: 34.8 g/dL (ref 30.0–36.0)
MCV: 84.9 fL (ref 80.0–100.0)
Monocytes Absolute: 1.2 10*3/uL — ABNORMAL HIGH (ref 0.1–1.0)
Monocytes Relative: 16 %
Neutro Abs: 5 10*3/uL (ref 1.7–7.7)
Neutrophils Relative %: 66 %
Platelet Count: 308 10*3/uL (ref 150–400)
RBC: 2.91 MIL/uL — ABNORMAL LOW (ref 4.22–5.81)
RDW: 16.4 % — ABNORMAL HIGH (ref 11.5–15.5)
WBC Count: 7.5 10*3/uL (ref 4.0–10.5)
nRBC: 0 % (ref 0.0–0.2)

## 2021-09-09 MED ORDER — SODIUM CHLORIDE 0.9 % IV SOLN
200.0000 mg | Freq: Once | INTRAVENOUS | Status: AC
  Administered 2021-09-09: 200 mg via INTRAVENOUS
  Filled 2021-09-09: qty 200

## 2021-09-09 MED ORDER — SODIUM CHLORIDE 0.9% FLUSH
10.0000 mL | Freq: Once | INTRAVENOUS | Status: AC
  Administered 2021-09-09: 10 mL

## 2021-09-09 MED ORDER — SODIUM CHLORIDE 0.9 % IV SOLN
10.0000 mg | Freq: Once | INTRAVENOUS | Status: AC
  Administered 2021-09-09: 10 mg via INTRAVENOUS
  Filled 2021-09-09: qty 10

## 2021-09-09 MED ORDER — SODIUM CHLORIDE 0.9 % IV SOLN
150.0000 mg | Freq: Once | INTRAVENOUS | Status: AC
  Administered 2021-09-09: 150 mg via INTRAVENOUS
  Filled 2021-09-09: qty 150

## 2021-09-09 MED ORDER — HEPARIN SOD (PORK) LOCK FLUSH 100 UNIT/ML IV SOLN
500.0000 [IU] | Freq: Once | INTRAVENOUS | Status: AC | PRN
  Administered 2021-09-09: 500 [IU]

## 2021-09-09 MED ORDER — PALONOSETRON HCL INJECTION 0.25 MG/5ML
0.2500 mg | Freq: Once | INTRAVENOUS | Status: AC
  Administered 2021-09-09: 0.25 mg via INTRAVENOUS
  Filled 2021-09-09: qty 5

## 2021-09-09 MED ORDER — SODIUM CHLORIDE 0.9% FLUSH
10.0000 mL | INTRAVENOUS | Status: DC | PRN
  Administered 2021-09-09: 10 mL

## 2021-09-09 MED ORDER — SODIUM CHLORIDE 0.9 % IV SOLN: Freq: Once | INTRAVENOUS | Status: AC

## 2021-09-09 MED ORDER — SODIUM CHLORIDE 0.9 % IV SOLN
472.0000 mg | Freq: Once | INTRAVENOUS | Status: AC
  Administered 2021-09-09: 470 mg via INTRAVENOUS
  Filled 2021-09-09: qty 47

## 2021-09-09 MED ORDER — SODIUM CHLORIDE 0.9 % IV SOLN
500.0000 mg/m2 | Freq: Once | INTRAVENOUS | Status: AC
  Administered 2021-09-09: 900 mg via INTRAVENOUS
  Filled 2021-09-09: qty 20

## 2021-09-09 NOTE — Progress Notes (Unsigned)
Terry [phone]   [phone]  PROGRESS NOTE  Patient Care Team: [name], MD as PCP - General (Family Medicine)  Hematological/Oncological History # Metastatic Adenocarcinoma of the Lung  01/07/2020 : CT of the chest with contrast performed which showed interval enlargement of a spiculated nodule in the central left upper lobe measuring 1.2 x 1.0 cm and highly suspicious for primary lung malignancy 02/06/2020 :PET scan performed which showed a hypermetabolic irregular solid 1.3 cm left upper lobe pulmonary nodule compatible with malignancy without any other hypermetabolic lesions 01/09/15- 04/19/2020:  SBRT to the lung lesion 04/28/2021: CT C/A/P showed multifocal lytic bone metastases are identified. Lesion within the spine of the left scapula and lesions involving bilateral iliac bones and left sacral wing. Establish care with Dr. Lorenso Courier  05/03/2021: biopsy of right iliac lytic lesion showed metastatic carcinoma, consistent with lung primary.  07/08/2021: Cycle 1 Day 1 of Carbo/Pem/Pem 07/29/2021: Cycle 2 Day 1 of Carbo/Pem/Pem 08/11/2021: Started palliative radiation to osseous metastasis in the left shoulder. 30 Gy in 10 fractions.  08/19/2021: Cycle 3 Day 1 of Carbo/Pem/Pem 09/09/2021: Cycle 4 Day 1 of Carbo/Pem/Pem  #Adenocarcinoma of the Prostate, [id number] 08/19/2019-10/07/2019: 70 Gy in 28 fractions of 2.5 Gy.  Radiation to the prostate was under the care of Dr. Tyler Pita  Interval History:  Terry Page. 66 y.o. male with medical history significant for metastatic adenocarcinoma of the lung who presents for a follow up visit. The patient's last visit was on 08/19/2021. In the interim since the last visit he has completed Cycle 3 of chemotherapy and started palliative radiation to left shoulder osseous metastasis.   On exam today Terry Page reports his last cycle of chemotherapy went quite well.  He reports that he is able to move more and  has considerably less pain.  He notes that he continues to take his morphine every 12 hours as prescribed by palliative care.  He notes his appetite is well and he is gaining weight.  He reports that he is not having any trouble with nausea, vomit, or diarrhea.  He notes that he is not having any numbness or tingling of his fingers and toes.  Overall he is tolerating treatment well and willing and able to proceed at this time.  He has no other complaints. A full 10 point ROS is listed below.  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 Gery Pray   Incidental lung nodule, greater than or equal to 60mm 10/22/2017   Left upper lobe - discovered on CTA   Prostate cancer ([place])    Spontaneous pneumothorax 10/20/2017   right   Tobacco abuse     SURGICAL HISTORY: Past Surgical History:  Procedure Laterality Date   BACK SURGERY     IR IMAGING GUIDED PORT INSERTION  06/29/2021   PROSTATE BIOPSY      SOCIAL HISTORY: Social History   Socioeconomic History   Marital status: Single    Spouse name: Not on file   Number of children: Not on file   Years of education: Not on file   Highest education level: Not on file  Occupational History   Occupation: retired  Tobacco Use   Smoking status: Former    Types: Cigarettes  Quit date: 11/22/2019    Years since quitting: 1.8   Smokeless tobacco: Never   Tobacco comments:    Patient reports quit 3 years ago. 06/25/20. [facility]  Vaping Use   Vaping Use: Never used  Substance and Sexual Activity   Alcohol use: Not Currently    Alcohol/week: 12.0 standard drinks of alcohol    Types: 12 Cans of beer per week    Comment: daily   Drug use: No   Sexual activity: Not Currently  Other Topics Concern   Not on file  Social History Narrative   Not on file    Social Determinants of Health   Financial Resource Strain: Not on file  Food Insecurity: Not on file  Transportation Needs: Unmet Transportation Needs (07/18/2021)   PRAPARE - Hydrologist (Medical): Yes    Lack of Transportation (Non-Medical): Yes  Physical Activity: Not on file  Stress: Not on file  Social Connections: Not on file  Intimate Partner Violence: Not on file    FAMILY HISTORY: Family History  Problem Relation Age of Onset   Cancer Cousin        maternal cousin   Cancer Cousin        paternal cousin   Cancer Cousin    Colon polyps Neg Hx    Pancreatic disease Neg Hx    Pancreatic cancer Neg Hx    Breast cancer Neg Hx    Colon cancer Neg Hx     ALLERGIES:  has No Known Allergies.  MEDICATIONS:  Current Outpatient Medications  Medication Sig Dispense Refill   acetaminophen (TYLENOL) 325 MG tablet Take 2 tablets (650 mg total) by mouth every 6 (six) hours as needed for mild pain or headache (fever >/= 101).     albuterol (VENTOLIN HFA) 108 (90 Base) MCG/ACT inhaler Inhale 2 puffs into the lungs every 6 (six) hours as needed for wheezing or shortness of breath. 8 g 1   amLODipine (NORVASC) 10 MG tablet Take 1 tablet by mouth daily.     atorvastatin (LIPITOR) 20 MG tablet Take 20 mg by mouth daily.     docusate sodium (COLACE) 100 MG capsule Take 100 mg by mouth 2 (two) times daily.     folic acid (FOLVITE) 1 MG tablet Take 1 tablet (1 mg total) by mouth daily. 30 tablet 3   hydrOXYzine (ATARAX) 25 MG tablet Take 1 tablet (25 mg total) by mouth at bedtime as needed for itching. 30 tablet 11   ipratropium-albuterol (DUONEB) 0.5-2.5 (3) MG/3ML SOLN Take 3 mLs by nebulization every 6 (six) hours as needed. (Patient taking differently: Take 3 mLs by nebulization every 6 (six) hours as needed (shortness of breathing or wheezing).) 360 mL 0   lidocaine (LIDODERM) 5 % Place 1 patch onto the skin daily. Remove & Discard patch within 12 hours or  as directed by MD. Apply to low back 30 patch 0   lidocaine-prilocaine (EMLA) cream Apply 1 Application topically as needed. 30 g 0   losartan (COZAAR) 100 MG tablet Take 100 mg by mouth daily.     morphine (MS CONTIN) 15 MG 12 hr tablet Take 1 tablet (15 mg total) by mouth every 12 (twelve) hours. 60 tablet 0   morphine (MSIR) 15 MG tablet Take 1 tablet (15 mg total) by mouth every 6 (six) hours as needed for severe pain. 60 tablet 0   olmesartan (BENICAR) 40 MG tablet Take 40 mg by mouth daily.  ondansetron (ZOFRAN) 8 MG tablet Take 1 tablet (8 mg total) by mouth every 8 (eight) hours as needed. 30 tablet 0   oxyCODONE (OXY IR/ROXICODONE) 5 MG immediate release tablet 1 tablet as needed     polyethylene glycol (MIRALAX) 17 g packet Take 17 g by mouth daily. (Patient not taking: Reported on 08/19/2021) 30 each 2   potassium chloride SA (KLOR-CON M) 20 MEQ tablet Take 1 tablet (20 mEq total) by mouth daily. 30 tablet 1   prochlorperazine (COMPAZINE) 10 MG tablet Take 1 tablet (10 mg total) by mouth every 6 (six) hours as needed for nausea or vomiting. 30 tablet 0   senna-docusate (SENNA S) 8.6-50 MG tablet Take 2 tablets by mouth daily. 60 tablet 2   thiamine 100 MG tablet Take 1 tablet (100 mg total) by mouth daily.     No current facility-administered medications for this visit.    REVIEW OF SYSTEMS:   Constitutional: ( - ) fevers, ( - )  chills , ( - ) night sweats Eyes: ( - ) blurriness of vision, ( - ) double vision, ( - ) watery eyes Ears, nose, mouth, throat, and face: ( - ) mucositis, ( - ) sore throat Respiratory: ( - ) cough, ( - ) dyspnea, ( - ) wheezes Cardiovascular: ( - ) palpitation, ( - ) chest discomfort, ( - ) lower extremity swelling Gastrointestinal:  ( - ) nausea, ( - ) heartburn, ( - ) change in bowel habits Skin: ( - ) abnormal skin rashes Lymphatics: ( - ) new lymphadenopathy, ( - ) easy bruising Neurological: ( - ) numbness, ( - ) tingling, ( - ) new  weaknesses Behavioral/Psych: ( - ) mood change, ( - ) new changes  All other systems were reviewed with the patient and are negative.  PHYSICAL EXAMINATION: ECOG PERFORMANCE STATUS: 1 - Symptomatic but completely ambulatory  Vitals:   09/09/21 0855  BP: 135/80  Pulse: 78  Resp: 15  Temp: 97.9 F (36.6 C)  SpO2: 99%   Filed Weights   09/09/21 0855  Weight: 149 lb 14.4 oz (68 kg)    GENERAL: Well-appearing elderly African-American male, alert, no distress and comfortable SKIN: skin color, texture, turgor are normal, no rashes or significant lesions EYES: conjunctiva are pink and non-injected, sclera clear LUNGS: clear to auscultation and percussion with normal breathing effort HEART: regular rate & rhythm and no murmurs and no lower extremity edema Musculoskeletal: no cyanosis of digits and no clubbing  PSYCH: alert & oriented x 3, fluent speech NEURO: no focal motor/sensory deficits  LABORATORY DATA:  I have reviewed the data as listed    Latest Ref Rng & Units 09/09/2021    8:40 AM 08/19/2021    8:40 AM 08/02/2021    1:24 PM  CBC  WBC 4.0 - 10.5 K/uL 7.5  6.2  11.4   Hemoglobin 13.0 - 17.0 g/dL 8.6  8.4  11.2   Hematocrit 39.0 - 52.0 % 24.7  24.9  34.5   Platelets 150 - 400 K/uL 308  431  464        Latest Ref Rng & Units 09/09/2021    8:40 AM 08/19/2021    8:40 AM 08/02/2021    1:24 PM  CMP  Glucose 70 - 99 mg/dL 108  114  104   BUN 8 - 23 mg/dL 12  13  22    Creatinine 0.61 - 1.24 mg/dL 0.93  0.70  0.73   Sodium 135 -  145 mmol/L 131  128  129   Potassium 3.5 - 5.1 mmol/L 4.3  4.4  5.0   Chloride 98 - 111 mmol/L 101  100  102   CO2 22 - 32 mmol/L 23  24  20    Calcium 8.9 - 10.3 mg/dL 9.8  9.3  9.5   Total Protein 6.5 - 8.1 g/dL 8.8  7.7  8.8   Total Bilirubin 0.3 - 1.2 mg/dL 0.2  0.2  0.6   Alkaline Phos 38 - 126 U/L 84  99  84   AST 15 - 41 U/L 15  13  15    ALT 0 - 44 U/L 11  10  17      Lab Results  Component Value Date   MPROTEIN Not Observed 04/28/2021    Lab Results  Component Value Date   KPAFRELGTCHN 33.6 (H) 04/28/2021   LAMBDASER 22.0 04/28/2021   KAPLAMBRATIO 1.53 04/28/2021    RADIOGRAPHIC STUDIES: No results found.  ASSESSMENT & PLAN Terry Page. 67 y.o. male with medical history significant for newly diagnosed metastatic adenocarcinoma of the lung who presents for a follow up visit.  # Metastatic Adenocarcinoma of the Lung  -- NGS testing from this patient shows no evidence of targetable mutation. --MRI brain shows no evidence of intracranial metastases --Started carboplatin, pembrolizumab, and pemetrexed as treatment for his cancer on 07/08/2021. --Started palliative radiation to osseous metastasis of the left shoulder on 08/11/2021. Planned dose is 30 Gy in 10 fx.  Plan: --today is Day 1 Cycle 4 of Carbo/Pem/Pem for metastatic adenocarcinoma of the lung --labs from today were reviewed and adequate for treatment.  Labs show white blood cell count 7.5, hemoglobin 8.6, MCV 84.9, and platelets of 308. --CT C/A/P prior to Cycle 5 to assure response to therapy prior to dropping carboplatin.  --RTC in 3 weeks time port labs, f/u visit and Cycle 5, Day 1 of chemotherapy  #Normocytic anemia: --Likely secondary to chemotherapy --Hgb 8.6 today, stable from last visit at 8.4.  --Patient is not complaining of fatigue or worsening SOB. Monitor closely --continue to monitor closely, may improve when carboplatin is dropped in Cycle 5.   #Pain Control--improving -- Currently taking MS Contin 15 mg q 12 hours and tylenol for breakthrough pain. No requiring MSIR at this time.  --Palliative care currently following --Continue senna docusate for constipation prophylaxis --Continue to monitor  #Supportive Care -- chemotherapy education complete -- port placed -- zofran 8mg  q8H PRN and compazine 10mg  PO q6H for nausea -- EMLA cream for port -- Patient referred to palliative care for pain control   Orders Placed This Encounter   Procedures   CT CHEST ABDOMEN PELVIS W CONTRAST    Standing Status:   Future    Standing Expiration Date:   09/11/2022    Order Specific Question:   Preferred imaging location?    Answer:   [facility]    Order Specific Question:   Is Oral Contrast requested for this exam?    Answer:   Yes, Per Radiology protocol   TSH    Standing Status:   Future    Standing Expiration Date:   10/21/2022   T4    Standing Status:   Future    Standing Expiration Date:   10/21/2022   CBC with Differential ([facility] Only)    Standing Status:   Future    Standing Expiration Date:   10/22/2022   CMP ([facility] only)    Standing  Status:   Future    Standing Expiration Date:   10/22/2022   T4    Standing Status:   Future    Standing Expiration Date:   10/22/2022   TSH    Standing Status:   Future    Standing Expiration Date:   10/22/2022    All questions were answered. The patient knows to call the clinic with any problems, questions or concerns.  I have spent a total of 30 minutes minutes of face-to-face and non-face-to-face time, preparing to see the patient, performing a medically appropriate examination, counseling and educating the patient, referring and communicating with other health care professionals, documenting clinical information in the electronic health record, and care coordination.   Ledell Peoples, MD Department of Hematology/Oncology [place] [facility] Phone: [phone] Pager: [phone] Email: [email or web address]  09/10/2021 4:51 PM

## 2021-09-09 NOTE — Patient Instructions (Signed)
[facility]  Discharge Instructions: Thank you for choosing [facility] to provide your oncology and hematology care.   If you have a lab appointment with the [facility], please go directly to the [facility] and check in at the registration area.   Wear comfortable clothing and clothing appropriate for easy access to any Portacath or PICC line.   We strive to give you quality time with your provider. You may need to reschedule your appointment if you arrive late (15 or more minutes).  Arriving late affects you and other patients whose appointments are after yours.  Also, if you miss three or more appointments without notifying the office, you may be dismissed from the clinic at the provider's discretion.      For prescription refill requests, have your pharmacy contact our office and allow 72 hours for refills to be completed.    Today you received the following chemotherapy and/or immunotherapy agents: Keytruda, alimta, carboplatin      To help prevent nausea and vomiting after your treatment, we encourage you to take your nausea medication as directed.  BELOW ARE SYMPTOMS THAT SHOULD BE REPORTED IMMEDIATELY: *FEVER GREATER THAN 100.4 F (38 C) OR HIGHER *CHILLS OR SWEATING *NAUSEA AND VOMITING THAT IS NOT CONTROLLED WITH YOUR NAUSEA MEDICATION *UNUSUAL SHORTNESS OF BREATH *UNUSUAL BRUISING OR BLEEDING *URINARY PROBLEMS (pain or burning when urinating, or frequent urination) *BOWEL PROBLEMS (unusual diarrhea, constipation, pain near the anus) TENDERNESS IN MOUTH AND THROAT WITH OR WITHOUT PRESENCE OF ULCERS (sore throat, sores in mouth, or a toothache) UNUSUAL RASH, SWELLING OR PAIN  UNUSUAL VAGINAL DISCHARGE OR ITCHING   Items with * indicate a potential emergency and should be followed up as soon as possible or go to the Emergency Department if any problems should occur.  Please show the CHEMOTHERAPY ALERT CARD or IMMUNOTHERAPY ALERT  CARD at check-in to the Emergency Department and triage nurse.  Should you have questions after your visit or need to cancel or reschedule your appointment, please contact [facility]  Dept: [phone]  and follow the prompts.  Office hours are 8:00 a.m. to 4:30 p.m. Monday - Friday. Please note that voicemails left after 4:00 p.m. may not be returned until the following business day.  We are closed weekends and major holidays. You have access to a nurse at all times for urgent questions. Please call the main number to the clinic Dept: [phone] and follow the prompts.   For any non-urgent questions, you may also contact your provider using MyChart. We now offer e-Visits for anyone 18 and older to request care online for non-urgent symptoms. For details visit [email or web address].   Also download the MyChart app! Go to the app store, search "MyChart", open the app, select [facility], and log in with your MyChart username and password.  Masks are optional in the cancer centers. If you would like for your care team to wear a mask while they are taking care of you, please let them know. You may have one support person who is at least 68 years old accompany you for your appointments.

## 2021-09-09 NOTE — Progress Notes (Signed)
Nutrition Follow-up:  Patient with metastatic adenocarcinoma of lung. He completed palliative radiation under the care of Dr. Tammi Klippel 8/23.  is currently receiving Carboplatin, Pemetrexed, + Pembrolizumab q21d (started 7/7).   Met with patient in infusion. [place] sandwich on side table that RN brought just before RD arrival. Patient continues to have good appetite. He had jimmy dean sausage and pancakes for breakfast. Patient appreciative of snack ideas and reports adding bedtime snack. Patient surprised he has not gained weight. He denies nutrition impact symptoms.   Medications: reviewed   Labs: Hgb 8.6, Na 131, glucose 108  Anthropometrics: Weight 149 lb 14.4 oz today   7/28 - 148 lb 4.8 oz  6/28 - 151 lb 14.4 oz    NUTRITION DIAGNOSIS: Unintentional weight loss stable     INTERVENTION:  Continue strategies for increasing calories and protein with small frequent meals/snacks Continue high protein bedtime snack    MONITORING, EVALUATION, GOAL: weight trends, intake    NEXT VISIT: Friday October 20 during infusion

## 2021-09-09 NOTE — Progress Notes (Signed)
i    Terry Page  [phone] [phone]) [phone]   Name: Terry Page. Date: 09/09/2021 MRN: [id number]  DOB: 02-Sep-1955  Patient Care Team: [name], MD as PCP - General (Family Medicine)     INTERVAL HISTORY: Saahil Herbster. is a 66 y.o. 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  Palliative ask to see for symptom management.  SOCIAL HISTORY:     reports that he quit smoking about 21 months ago. His smoking use included cigarettes. He has never used smokeless tobacco. He reports that he does not currently use alcohol after a past usage of about 12.0 standard drinks of alcohol per week. He reports that he does not use drugs.  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 Gery Pray   Incidental lung nodule, greater than or equal to 56mm 10/22/2017   Left upper lobe - discovered on CTA   Prostate cancer ([place])    Spontaneous pneumothorax 10/20/2017   right   Tobacco abuse     ALLERGIES:  has No Known Allergies.  MEDICATIONS:  Current Outpatient Medications  Medication Sig Dispense Refill   acetaminophen (TYLENOL) 325 MG tablet Take 2 tablets (650 mg total) by mouth every 6 (six) hours as needed for mild pain or headache (fever >/= 101).     albuterol (VENTOLIN HFA) 108 (90 Base) MCG/ACT inhaler Inhale 2 puffs into the lungs every 6 (six) hours as needed for wheezing or shortness of breath. 8 g 1   amLODipine (NORVASC) 10 MG tablet Take 1 tablet by mouth daily.     atorvastatin (LIPITOR) 20 MG tablet Take 20 mg by  mouth daily.     docusate sodium (COLACE) 100 MG capsule Take 100 mg by mouth 2 (two) times daily.     folic acid (FOLVITE) 1 MG tablet Take 1 tablet (1 mg total) by mouth daily. 30 tablet 3   hydrOXYzine (ATARAX) 25 MG tablet Take 1 tablet (25 mg total) by mouth at bedtime as needed for itching. 30 tablet 11   ipratropium-albuterol (DUONEB) 0.5-2.5 (3) MG/3ML SOLN Take 3 mLs by nebulization every 6 (six) hours as needed. (Patient taking differently: Take 3 mLs by nebulization every 6 (six) hours as needed (shortness of breathing or wheezing).) 360 mL 0   lidocaine (LIDODERM) 5 % Place 1 patch onto the skin daily. Remove & Discard patch within 12 hours or as directed by MD. Apply to low back 30 patch 0   lidocaine-prilocaine (EMLA) cream Apply 1 Application topically as needed. 30 g 0   losartan (COZAAR) 100 MG tablet Take 100 mg by mouth daily.     morphine (MS CONTIN) 15 MG 12 hr tablet Take 1 tablet (15 mg total) by mouth every 12 (twelve) hours. 60 tablet 0   morphine (MSIR) 15 MG tablet Take 1 tablet (15 mg total) by mouth every 6 (six) hours as needed for severe pain. 60 tablet 0   olmesartan (BENICAR) 40 MG tablet Take 40 mg by mouth daily.     ondansetron (ZOFRAN) 8 MG  tablet Take 1 tablet (8 mg total) by mouth every 8 (eight) hours as needed. 30 tablet 0   oxyCODONE (OXY IR/ROXICODONE) 5 MG immediate release tablet 1 tablet as needed     polyethylene glycol (MIRALAX) 17 g packet Take 17 g by mouth daily. (Patient not taking: Reported on 08/19/2021) 30 each 2   potassium chloride SA (KLOR-CON M) 20 MEQ tablet Take 1 tablet (20 mEq total) by mouth daily. 30 tablet 1   prochlorperazine (COMPAZINE) 10 MG tablet Take 1 tablet (10 mg total) by mouth every 6 (six) hours as needed for nausea or vomiting. 30 tablet 0   senna-docusate (SENNA S) 8.6-50 MG tablet Take 2 tablets by mouth daily. 60 tablet 2   thiamine 100 MG tablet Take 1 tablet (100 mg total) by mouth daily.     No current  facility-administered medications for this visit.   Facility-Administered Medications Ordered in Other Visits  Medication Dose Route Frequency Provider Last Rate Last Admin   CARBOplatin (PARAPLATIN) 470 mg in sodium chloride 0.9 % 250 mL chemo infusion  470 mg Intravenous Once Ledell Peoples IV, MD       heparin lock flush 100 unit/mL  500 Units Intracatheter Once PRN Orson Slick, MD       PEMEtrexed (ALIMTA) 900 mg in sodium chloride 0.9 % 100 mL chemo infusion  500 mg/m2 (Treatment Plan Recorded) Intravenous Once Ledell Peoples IV, MD       sodium chloride flush (NS) 0.9 % injection 10 mL  10 mL Intracatheter PRN Orson Slick, MD        VITAL SIGNS: There were no vitals taken for this visit. There were no vitals filed for this visit.  Estimated body mass index is 22.79 kg/m as calculated from the following:   Height as of 09/01/21: 5\' 8"  (1.727 m).   Weight as of an earlier encounter on 09/09/21: 149 lb 14.4 oz (68 kg).   PERFORMANCE STATUS (ECOG) : 1 - Symptomatic but completely ambulatory   IMPRESSION: I saw Mr. Angelos today during his infusion for symptom management follow-up. Denies any acute distress. Denies nausea, vomiting, or diarrhea. Is appreciative of how well he is feeling. Shows me that he can lift both arms without significant pain or limitations. Discussed good response with radiation. His brother is also on the phone expressing appreciation of how well patient is doing.   Neoplasm related pain  Pain is much better. He is not requiring MS IR except on occassions. Expresses will take if his activity level is high and he begins to have some discomfort. Maybe once a week if that. Is not having to take MS Contin every 12 hours. Reports taking only in the morning which keeps him from having to take the MS IR. Refill history reflects reported use with last refill sent on 7/28 (Ms Contin) and 7/18 (MS IR). Is not in need of refills at this time.   I am glad his pain is  much improved. We discussed plans to begin weaning down off of the MS Contin in the near future as he continue to show improvement.   We will continue to closely monitor.   Constipation  Constipation is improved. He is taking Miralax and Senna as recommended.   I discussed the importance of continued conversation with family and their medical providers regarding overall plan of care and treatment options, ensuring decisions are within the context of the patients values and GOCs.  PLAN:  Continue with MS Contin 15mg  daily. MS IR 15 mg no longer requiring but on rare occassions with increased activity. Miralax/Senna daily  I will plan to see patient back in 3-4 weeks in collaboration with his other oncology appointments. Will give him a call next week for close symptom follow-up.    Patient expressed understanding and was in agreement with this plan. He also understands that He can call the clinic at any time with any questions, concerns, or complaints.      Any controlled substances utilized were prescribed in the context of palliative care. PDMP has been reviewed.     Time Total: 30 min   Visit consisted of counseling and education dealing with the complex and emotionally intense issues of symptom management and palliative care in the setting of serious and potentially life-threatening illness.Greater than 50%  of this time was spent counseling and coordinating care related to the above assessment and plan.  Alda Lea, AGPCNP-BC  Palliative Medicine Team/[facility]

## 2021-09-10 ENCOUNTER — Encounter: Payer: Self-pay | Admitting: Hematology and Oncology

## 2021-09-11 LAB — T4: T4, Total: 9 ug/dL (ref 4.5–12.0)

## 2021-09-12 ENCOUNTER — Inpatient Hospital Stay: Payer: Medicare HMO

## 2021-09-12 ENCOUNTER — Other Ambulatory Visit: Payer: Self-pay

## 2021-09-12 ENCOUNTER — Inpatient Hospital Stay: Payer: Medicare HMO | Admitting: Licensed Clinical Social Worker

## 2021-09-12 NOTE — Progress Notes (Signed)
[place] [facility]  Patient presented to [facility]  to review and complete healthcare advance directives.  Clinical Social Worker met with patient.  The patient designated Child psychotherapist as their primary healthcare agent and Bethel Born as their secondary agent.  Patient also completed healthcare living will.    Documents were notarized and copies made for patient/family. Clinical Social Worker will send documents to medical records to be scanned into patient's chart. Clinical Social Worker encouraged patient/family to contact with any additional questions or concerns.   [place], [place] Worker [employer]

## 2021-09-21 ENCOUNTER — Encounter: Payer: Self-pay | Admitting: Hematology and Oncology

## 2021-09-22 ENCOUNTER — Encounter: Payer: Self-pay | Admitting: Hematology and Oncology

## 2021-09-23 ENCOUNTER — Ambulatory Visit (HOSPITAL_COMMUNITY)
Admission: RE | Admit: 2021-09-23 | Discharge: 2021-09-23 | Disposition: A | Discharge status: Home / Self Care | Payer: Medicare HMO | Source: Ambulatory Visit | Attending: Hematology and Oncology | Admitting: Hematology and Oncology

## 2021-09-23 DIAGNOSIS — C3412 Malignant neoplasm of upper lobe, left bronchus or lung: Secondary | ICD-10-CM | POA: Insufficient documentation

## 2021-09-23 MED ORDER — HEPARIN SOD (PORK) LOCK FLUSH 100 UNIT/ML IV SOLN
INTRAVENOUS | Status: AC
  Filled 2021-09-23: qty 5

## 2021-09-23 MED ORDER — SODIUM CHLORIDE (PF) 0.9 % IJ SOLN
INTRAMUSCULAR | Status: AC
  Filled 2021-09-23: qty 50

## 2021-09-23 MED ORDER — HEPARIN SOD (PORK) LOCK FLUSH 100 UNIT/ML IV SOLN: 500.0000 [IU] | Freq: Once | INTRAVENOUS | Status: DC

## 2021-09-23 MED ORDER — IOHEXOL 300 MG/ML  SOLN
100.0000 mL | Freq: Once | INTRAMUSCULAR | Status: AC | PRN
  Administered 2021-09-23: 100 mL via INTRAVENOUS

## 2021-09-26 ENCOUNTER — Telehealth: Payer: Self-pay | Admitting: *Deleted

## 2021-09-26 NOTE — Telephone Encounter (Signed)
-----   Message from Orson Slick, MD sent at 09/25/2021  7:04 PM EDT ----- Please let Mr. Mapel know that his CT scan is stable.  No evidence of progressive disease.  We will continue on his current treatment course. ----- Message ----- From: Interface, Rad Results In Sent: 09/24/2021   1:52 PM EDT To: Orson Slick, MD

## 2021-09-26 NOTE — Telephone Encounter (Signed)
TCT patient regarding results of recent CT scan. Spoke with pt and advised that his CT scan is stable, and that there is no progressive disease. Pt voiced understanding.

## 2021-09-30 ENCOUNTER — Inpatient Hospital Stay: Payer: Medicare HMO

## 2021-09-30 ENCOUNTER — Other Ambulatory Visit: Payer: Self-pay

## 2021-09-30 ENCOUNTER — Inpatient Hospital Stay (HOSPITAL_BASED_OUTPATIENT_CLINIC_OR_DEPARTMENT_OTHER): Payer: Medicare HMO | Admitting: Physician Assistant

## 2021-09-30 VITALS — HR 97

## 2021-09-30 VITALS — BP 93/63 | HR 107 | Temp 97.3°F | Resp 16 | Wt 149.3 lb

## 2021-09-30 DIAGNOSIS — C3412 Malignant neoplasm of upper lobe, left bronchus or lung: Secondary | ICD-10-CM

## 2021-09-30 DIAGNOSIS — Z5112 Encounter for antineoplastic immunotherapy: Secondary | ICD-10-CM | POA: Diagnosis not present

## 2021-09-30 DIAGNOSIS — Z95828 Presence of other vascular implants and grafts: Secondary | ICD-10-CM

## 2021-09-30 LAB — CMP (CANCER CENTER ONLY)
ALT: 11 U/L (ref 0–44)
AST: 15 U/L (ref 15–41)
Albumin: 3.9 g/dL (ref 3.5–5.0)
Alkaline Phosphatase: 82 U/L (ref 38–126)
Anion gap: 7 (ref 5–15)
BUN: 11 mg/dL (ref 8–23)
CO2: 22 mmol/L (ref 22–32)
Calcium: 9.2 mg/dL (ref 8.9–10.3)
Chloride: 101 mmol/L (ref 98–111)
Creatinine: 0.94 mg/dL (ref 0.61–1.24)
GFR, Estimated: >60 mL/min (ref >60)
Glucose, Bld: 114 mg/dL — ABNORMAL HIGH (ref 70–99)
Potassium: 4.8 mmol/L (ref 3.5–5.1)
Sodium: 130 mmol/L — ABNORMAL LOW (ref 135–145)
Total Bilirubin: 0.3 mg/dL (ref 0.3–1.2)
Total Protein: 8.3 g/dL — ABNORMAL HIGH (ref 6.5–8.1)

## 2021-09-30 LAB — CBC WITH DIFFERENTIAL (CANCER CENTER ONLY)
Abs Immature Granulocytes: 0.08 10*3/uL — ABNORMAL HIGH (ref 0.00–0.07)
Basophils Absolute: 0 10*3/uL (ref 0.0–0.1)
Basophils Relative: 0 %
Eosinophils Absolute: 0.2 10*3/uL (ref 0.0–0.5)
Eosinophils Relative: 3 %
HCT: 24.1 % — ABNORMAL LOW (ref 39.0–52.0)
Hemoglobin: 8.2 g/dL — ABNORMAL LOW (ref 13.0–17.0)
Immature Granulocytes: 1 %
Lymphocytes Relative: 12 %
Lymphs Abs: 0.9 10*3/uL (ref 0.7–4.0)
MCH: 30.3 pg (ref 26.0–34.0)
MCHC: 34 g/dL (ref 30.0–36.0)
MCV: 88.9 fL (ref 80.0–100.0)
Monocytes Absolute: 1 10*3/uL (ref 0.1–1.0)
Monocytes Relative: 14 %
Neutro Abs: 5.5 10*3/uL (ref 1.7–7.7)
Neutrophils Relative %: 70 %
Platelet Count: 293 10*3/uL (ref 150–400)
RBC: 2.71 MIL/uL — ABNORMAL LOW (ref 4.22–5.81)
RDW: 19.9 % — ABNORMAL HIGH (ref 11.5–15.5)
WBC Count: 7.7 10*3/uL (ref 4.0–10.5)
nRBC: 0 % (ref 0.0–0.2)

## 2021-09-30 LAB — TSH: TSH: 2.148 u[IU]/mL (ref 0.350–4.500)

## 2021-09-30 MED ORDER — SODIUM CHLORIDE 0.9 % IV SOLN: Freq: Once | INTRAVENOUS | Status: AC

## 2021-09-30 MED ORDER — SODIUM CHLORIDE 0.9 % IV SOLN
500.0000 mg/m2 | Freq: Once | INTRAVENOUS | Status: AC
  Administered 2021-09-30: 900 mg via INTRAVENOUS
  Filled 2021-09-30: qty 20

## 2021-09-30 MED ORDER — SODIUM CHLORIDE 0.9% FLUSH
10.0000 mL | INTRAVENOUS | Status: DC | PRN
  Administered 2021-09-30: 10 mL

## 2021-09-30 MED ORDER — SODIUM CHLORIDE 0.9 % IV SOLN
200.0000 mg | Freq: Once | INTRAVENOUS | Status: AC
  Administered 2021-09-30: 200 mg via INTRAVENOUS
  Filled 2021-09-30: qty 200

## 2021-09-30 MED ORDER — PROCHLORPERAZINE MALEATE 10 MG PO TABS
10.0000 mg | ORAL_TABLET | Freq: Once | ORAL | Status: AC
  Administered 2021-09-30: 10 mg via ORAL
  Filled 2021-09-30: qty 1

## 2021-09-30 MED ORDER — HEPARIN SOD (PORK) LOCK FLUSH 100 UNIT/ML IV SOLN
500.0000 [IU] | Freq: Once | INTRAVENOUS | Status: AC | PRN
  Administered 2021-09-30: 500 [IU]

## 2021-09-30 MED ORDER — SODIUM CHLORIDE 0.9% FLUSH
10.0000 mL | Freq: Once | INTRAVENOUS | Status: AC
  Administered 2021-09-30: 10 mL

## 2021-09-30 NOTE — Progress Notes (Signed)
[name]:([phone]) [phone]   Fax:([phone]) [phone]  PROGRESS NOTE  Patient Care Team: [name], MD as PCP - General (Family Medicine)  Hematological/Oncological History # Metastatic Adenocarcinoma of the Lung  01/07/2020 : CT of the chest with contrast performed which showed interval enlargement of a spiculated nodule in the central left upper lobe measuring 1.2 x 1.0 cm and highly suspicious for primary lung malignancy 02/06/2020 :PET scan performed which showed a hypermetabolic irregular solid 1.3 cm left upper lobe pulmonary nodule compatible with malignancy without any other hypermetabolic lesions 09/05/5036- 04/19/2020:  SBRT to the lung lesion 04/28/2021: CT C/A/P showed multifocal lytic bone metastases are identified. Lesion within the spine of the left scapula and lesions involving bilateral iliac bones and left sacral wing. Establish care with Dr. Lorenso Courier  05/03/2021: biopsy of right iliac lytic lesion showed metastatic carcinoma, consistent with lung primary.  07/08/2021: Cycle 1 Day 1 of Carbo/Pem/Pem 07/29/2021: Cycle 2 Day 1 of Carbo/Pem/Pem 08/11/2021-08/24/2021: Received palliative radiation to osseous metastasis in the left shoulder. 30 Gy in 10 fractions.  08/19/2021: Cycle 3 Day 1 of Carbo/Pem/Pem 09/09/2021: Cycle 4 Day 1 of Carbo/Pem/Pem 09/23/2021: CT CAP: stable disease 09/30/2021: Cycle 5 Day 1 of Pem/Pem  #Adenocarcinoma of the Prostate, T1cN0M0 08/19/2019-10/07/2019: 70 Gy in 28 fractions of 2.5 Gy.  Radiation to the prostate was under the care of Dr. Tyler Pita  Interval History:  Terry Page. 66 y.o. male with medical history significant for metastatic adenocarcinoma of the lung who presents for a follow up visit. The patient's last visit was on 09/09/2021. In the interim since the last visit he has completed Cycle 4 of chemotherapy.  On exam today [name] reports he is doing well and tolerating treatment.  His appetite is stable and denies  any weight loss.  He denies nausea, vomiting or abdominal pain.  His bowel habits are unchanged without any recurrent episodes of diarrhea or constipation.  He reports his left shoulder pain continues to improve and takes his prescribed pain medication for any residual pain.  He denies any fevers, chills, night sweats, shortness of breath, chest pain, cough, peripheral edema or neuropathy.  He has no other complaints.A full 10 point ROS is listed below.  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 Gery Pray   Incidental lung nodule, greater than or equal to 87mm 10/22/2017   Left upper lobe - discovered on CTA   Prostate cancer ([place])    Spontaneous pneumothorax 10/20/2017   right   Tobacco abuse     SURGICAL HISTORY: Past Surgical History:  Procedure Laterality Date   BACK SURGERY     IR IMAGING GUIDED PORT INSERTION  06/29/2021   PROSTATE BIOPSY      SOCIAL HISTORY: Social History   Socioeconomic History   Marital status: Single    Spouse name: Not on file   Number of children: Not on file   Years of education: Not on file   Highest education level: Not on file  Occupational History   Occupation: retired  Tobacco Use   Smoking status: Former    Types: Cigarettes    Quit date: 11/22/2019    Years since quitting: 1.8   Smokeless tobacco: Never   Tobacco comments:  Patient reports quit 3 years ago. 06/25/20. [facility]  Vaping Use   Vaping Use: Never used  Substance and Sexual Activity   Alcohol use: Not Currently    Alcohol/week: 12.0 standard drinks of alcohol    Types: 12 Cans of beer per week    Comment: daily   Drug use: No   Sexual activity: Not Currently  Other Topics Concern   Not on file  Social History Narrative   Not on file   Social Determinants of Health    Financial Resource Strain: Not on file  Food Insecurity: Not on file  Transportation Needs: Unmet Transportation Needs (07/18/2021)   PRAPARE - Hydrologist (Medical): Yes    Lack of Transportation (Non-Medical): Yes  Physical Activity: Not on file  Stress: Not on file  Social Connections: Not on file  Intimate Partner Violence: Not on file    FAMILY HISTORY: Family History  Problem Relation Age of Onset   Cancer Cousin        maternal cousin   Cancer Cousin        paternal cousin   Cancer Cousin    Colon polyps Neg Hx    Pancreatic disease Neg Hx    Pancreatic cancer Neg Hx    Breast cancer Neg Hx    Colon cancer Neg Hx     ALLERGIES:  has No Known Allergies.  MEDICATIONS:  Current Outpatient Medications  Medication Sig Dispense Refill   acetaminophen (TYLENOL) 325 MG tablet Take 2 tablets (650 mg total) by mouth every 6 (six) hours as needed for mild pain or headache (fever >/= 101).     albuterol (VENTOLIN HFA) 108 (90 Base) MCG/ACT inhaler Inhale 2 puffs into the lungs every 6 (six) hours as needed for wheezing or shortness of breath. 8 g 1   amLODipine (NORVASC) 10 MG tablet Take 1 tablet by mouth daily.     atorvastatin (LIPITOR) 20 MG tablet Take 20 mg by mouth daily.     docusate sodium (COLACE) 100 MG capsule Take 100 mg by mouth 2 (two) times daily.     folic acid (FOLVITE) 1 MG tablet Take 1 tablet (1 mg total) by mouth daily. 30 tablet 3   hydrOXYzine (ATARAX) 25 MG tablet Take 1 tablet (25 mg total) by mouth at bedtime as needed for itching. 30 tablet 11   ipratropium-albuterol (DUONEB) 0.5-2.5 (3) MG/3ML SOLN Take 3 mLs by nebulization every 6 (six) hours as needed. (Patient taking differently: Take 3 mLs by nebulization every 6 (six) hours as needed (shortness of breathing or wheezing).) 360 mL 0   lidocaine (LIDODERM) 5 % Place 1 patch onto the skin daily. Remove & Discard patch within 12 hours or as directed by MD. Apply to low  back 30 patch 0   lidocaine-prilocaine (EMLA) cream Apply 1 Application topically as needed. 30 g 0   morphine (MS CONTIN) 15 MG 12 hr tablet Take 1 tablet (15 mg total) by mouth every 12 (twelve) hours. 60 tablet 0   morphine (MSIR) 15 MG tablet Take 1 tablet (15 mg total) by mouth every 6 (six) hours as needed for severe pain. 60 tablet 0   olmesartan (BENICAR) 40 MG tablet Take 40 mg by mouth daily.     ondansetron (ZOFRAN) 8 MG tablet Take 1 tablet (8 mg total) by mouth every 8 (eight) hours as needed. 30 tablet 0   oxyCODONE (OXY IR/ROXICODONE) 5 MG immediate release tablet 1 tablet as  needed     polyethylene glycol (MIRALAX) 17 g packet Take 17 g by mouth daily. 30 each 2   potassium chloride SA (KLOR-CON M) 20 MEQ tablet Take 1 tablet (20 mEq total) by mouth daily. 30 tablet 1   prochlorperazine (COMPAZINE) 10 MG tablet Take 1 tablet (10 mg total) by mouth every 6 (six) hours as needed for nausea or vomiting. 30 tablet 0   senna-docusate (SENNA S) 8.6-50 MG tablet Take 2 tablets by mouth daily. 60 tablet 2   thiamine 100 MG tablet Take 1 tablet (100 mg total) by mouth daily.     losartan (COZAAR) 100 MG tablet Take 100 mg by mouth daily. (Patient not taking: Reported on 09/30/2021)     No current facility-administered medications for this visit.    REVIEW OF SYSTEMS:   Constitutional: ( - ) fevers, ( - )  chills , ( - ) night sweats Eyes: ( - ) blurriness of vision, ( - ) double vision, ( - ) watery eyes Ears, nose, mouth, throat, and face: ( - ) mucositis, ( - ) sore throat Respiratory: ( - ) cough, ( - ) dyspnea, ( - ) wheezes Cardiovascular: ( - ) palpitation, ( - ) chest discomfort, ( - ) lower extremity swelling Gastrointestinal:  ( - ) nausea, ( - ) heartburn, ( - ) change in bowel habits Skin: ( - ) abnormal skin rashes Lymphatics: ( - ) new lymphadenopathy, ( - ) easy bruising Neurological: ( - ) numbness, ( - ) tingling, ( - ) new weaknesses Behavioral/Psych: ( - ) mood  change, ( - ) new changes  All other systems were reviewed with the patient and are negative.  PHYSICAL EXAMINATION: ECOG PERFORMANCE STATUS: 1 - Symptomatic but completely ambulatory  Vitals:   09/30/21 0824  BP: 93/63  Pulse: (!) 107  Resp: 16  Temp: (!) 97.3 F (36.3 C)  SpO2: 100%   Filed Weights   09/30/21 0824  Weight: 149 lb 4.8 oz (67.7 kg)    GENERAL: Well-appearing elderly African-American male, alert, no distress and comfortable SKIN: skin color, texture, turgor are normal, no rashes or significant lesions EYES: conjunctiva are pink and non-injected, sclera clear LUNGS: clear to auscultation and percussion with normal breathing effort HEART: regular rate & rhythm and no murmurs and no lower extremity edema Musculoskeletal: no cyanosis of digits and no clubbing  PSYCH: alert & oriented x 3, fluent speech NEURO: no focal motor/sensory deficits  LABORATORY DATA:  I have reviewed the data as listed    Latest Ref Rng & Units 09/30/2021    7:57 AM 09/09/2021    8:40 AM 08/19/2021    8:40 AM  CBC  WBC 4.0 - 10.5 K/uL 7.7  7.5  6.2   Hemoglobin 13.0 - 17.0 g/dL 8.2  8.6  8.4   Hematocrit 39.0 - 52.0 % 24.1  24.7  24.9   Platelets 150 - 400 K/uL 293  308  431        Latest Ref Rng & Units 09/30/2021    7:57 AM 09/09/2021    8:40 AM 08/19/2021    8:40 AM  CMP  Glucose 70 - 99 mg/dL 114  108  114   BUN 8 - 23 mg/dL 11  12  13    Creatinine 0.61 - 1.24 mg/dL 0.94  0.93  0.70   Sodium 135 - 145 mmol/L 130  131  128   Potassium 3.5 - 5.1 mmol/L 4.8  4.3  4.4   Chloride 98 - 111 mmol/L 101  101  100   CO2 22 - 32 mmol/L 22  23  24    Calcium 8.9 - 10.3 mg/dL 9.2  9.8  9.3   Total Protein 6.5 - 8.1 g/dL 8.3  8.8  7.7   Total Bilirubin 0.3 - 1.2 mg/dL 0.3  0.2  0.2   Alkaline Phos 38 - 126 U/L 82  84  99   AST 15 - 41 U/L 15  15  13    ALT 0 - 44 U/L 11  11  10      Lab Results  Component Value Date   MPROTEIN Not Observed 04/28/2021   Lab Results  Component Value  Date   KPAFRELGTCHN 33.6 (H) 04/28/2021   LAMBDASER 22.0 04/28/2021   KAPLAMBRATIO 1.53 04/28/2021    RADIOGRAPHIC STUDIES: CT CHEST ABDOMEN PELVIS W CONTRAST  Result Date: 09/24/2021 CLINICAL DATA:  Metastatic lung cancer restaging, osseous metastases, additional history of prostate cancer status post radiation * Tracking Code: BO * EXAM: CT CHEST, ABDOMEN, AND PELVIS WITH CONTRAST TECHNIQUE: Multidetector CT imaging of the chest, abdomen and pelvis was performed following the standard protocol during bolus administration of intravenous contrast. RADIATION DOSE REDUCTION: This exam was performed according to the departmental dose-optimization program which includes automated exposure control, adjustment of the mA and/or kV according to patient size and/or use of iterative reconstruction technique. CONTRAST:  12mL OMNIPAQUE IOHEXOL 300 MG/ML SOLN additional oral enteric contrast COMPARISON:  CT chest, 08/10/2021 CT chest abdomen pelvis, 04/28/2021 FINDINGS: CT CHEST FINDINGS Cardiovascular: Right chest port catheter. Aortic atherosclerosis. Normal heart size. Three-vessel coronary artery calcifications no pericardial effusion. Mediastinum/Nodes: No enlarged mediastinal, hilar, or axillary lymph nodes. Thyroid gland, trachea, and esophagus demonstrate no significant findings. Lungs/Pleura: Severe, bullous emphysema, worst in the left apex. Unchanged post treatment scarring and fibrosis about a previously seen nodule in the central left upper lobe, although difficult to distinguish against adjacent fibrosis this is a proximally 1.1 x 1.0 cm, unchanged (series 4, image 47). Additional fibrosis and consolidation about the left hilum (series 4, image 64). New small irregular nodule and adjacent ground-glass in the peripheral right upper lobe measuring 1.0 x 0.6 cm (series 4, image 66). No pleural effusion or pneumothorax. Musculoskeletal: No chest wall abnormality. No acute osseous findings. Similar appearance  of extensive mixed lytic and sclerotic osseous metastatic disease involving the left scapula as seen on recent prior examination dated 08/10/2021 (series 2, image 13). Unchanged, subtle sclerotic lesion of the anterior left fifth rib (series 4, image 84). Unchanged, subtle superior endplate deformity of K09. CT ABDOMEN PELVIS FINDINGS Hepatobiliary: Multiple low-attenuation liver lesions, unchanged, largest of which are clearly fluid attenuation simple cysts, others too small to characterize although most likely additional benign cysts. No gallstones, gallbladder wall thickening, or biliary dilatation. Pancreas: Unremarkable. No pancreatic ductal dilatation or surrounding inflammatory changes. 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 Adrenals/Urinary Tract: Adrenal glands are unremarkable. Simple, benign bilateral renal cortical cysts, for which no further follow-up or characterization is required. Unchanged prominent left renal pelvis with abrupt caliber change at the ureteropelvic junction. Bladder is unremarkable. Stomach/Bowel: Stomach is within normal limits. Appendix is not clearly visualized. No evidence of bowel wall thickening, distention, or inflammatory changes. Vascular/Lymphatic: Aortic atherosclerosis. No enlarged abdominal or pelvic lymph nodes. Reproductive: No mass or other abnormality. Other: No abdominal wall hernia or abnormality. No ascites. Musculoskeletal: No acute osseous findings. Multiple mixed lytic and sclerotic osseous metastatic lesions are again seen  involving the pelvis, particularly the left sacral ala, posterior left ilium (series 2, image 97) and right acetabulum (series 2, image 101), these lesions demonstrating substantial interval bony healing in comparison to most recent imaging of the pelvis dated 04/28/2021. There are however newly sclerotic osseous metastatic lesions of the pelvis, including a sclerotic pathologic fracture of the right pubic symphysis (series 2, image  116) and a new sclerotic lesion of the left ischial ramus and inferior pubic ramus (series 2, image 124). IMPRESSION: 1. Unchanged post treatment/post radiation scarring and fibrosis about a previously seen nodule in the central left upper lobe as well as about the left hilum. 2. New small irregular nodule and adjacent ground-glass in the peripheral right upper lobe measuring 1.0 x 0.6 cm, presumably infectious or inflammatory given short interval development. Attention on follow-up. 3. Similar appearance of extensive mixed lytic and sclerotic osseous metastatic disease involving the left scapula as seen on recent prior examination dated 08/10/2021. Unchanged, subtle sclerotic lesion of the anterior left fifth rib, which may reflect a chronic fracture deformity with or without an underlying small metastasis. 4. Multiple mixed lytic and sclerotic osseous metastatic lesions are again seen involving the pelvis, particularly the left sacral ala, posterior left ilium and right acetabulum, these lesions demonstrating substantial interval bony healing in comparison to most recent imaging of the pelvis dated 04/28/2021. There are however newly sclerotic osseous metastatic lesions of the pelvis, including a sclerotic nondisplaced pathologic fracture of the right pubic symphysis and a new sclerotic lesion of the left ischial ramus and inferior pubic ramus consistent with worsened, although now treated osseous metastatic disease in the pelvis in the interval from 04/28/2021. 5. No evidence of soft tissue metastatic disease or lymphadenopathy in the chest, abdomen, or pelvis. 6. Unchanged chronic left ureteropelvic junction stricture. 7. Coronary artery disease. Aortic Atherosclerosis (ICD10-I70.0). Electronically Signed   By: Delanna Ahmadi M.D.   On: 09/24/2021 13:49    ASSESSMENT & PLAN Terry Page. 66 y.o. male with medical history significant for newly diagnosed metastatic adenocarcinoma of the lung who presents  for a follow up visit.  # Metastatic Adenocarcinoma of the Lung  -- NGS testing from this patient shows no evidence of targetable mutation. --MRI brain shows no evidence of intracranial metastases --Started carboplatin, pembrolizumab, and pemetrexed as treatment for his cancer on 07/08/2021. --Received palliative radiation to osseous metastasis of the left shoulder from 08/11/2021-08/24/2021. Planned dose is 30 Gy in 10 fx.  Plan: --today is Day 1 Cycle 5 of Pem/Pem for metastatic adenocarcinoma of the lung --labs from today were reviewed and adequate for treatment.  Labs show white blood cell count 7.7, hemoglobin 8.2, MCV 88.9, and platelets of 293 --CT C/A/P prior to Cycle 5 to assure response to therapy prior to dropping carboplatin.  --RTC in 3 weeks time port labs, f/u visit and Cycle 5, Day 1 of chemotherapy  #Normocytic anemia: --Likely secondary to chemotherapy --Hgb 8.2 today, stable from last visit at 8.6.  --Patient is not complaining of fatigue or worsening SOB. Monitor closely --continue to monitor closely, may improve when carboplatin is dropped starting today with Cycle 5.   #Pain Control--improving -- Currently taking MS Contin 15 mg q 12 hours and tylenol for breakthrough pain. No requiring MSIR at this time.  --Palliative care currently following --Continue senna docusate for constipation prophylaxis --Continue to monitor  #Supportive Care -- chemotherapy education complete -- port placed -- zofran 8mg  q8H PRN and compazine 10mg  PO q6H for nausea --  EMLA cream for port -- Patient referred to palliative care for pain control   No orders of the defined types were placed in this encounter.   All questions were answered. The patient knows to call the clinic with any problems, questions or concerns.  I have spent a total of 30 minutes minutes of face-to-face and non-face-to-face time, preparing to see the patient, performing a medically appropriate examination,  counseling and educating the patient, referring and communicating with other health care professionals, documenting clinical information in the electronic health record, and care coordination.   Dede Query PA-C Dept of Hematology and [place] at [facility] Phone: [phone]   09/30/2021 9:01 AM

## 2021-09-30 NOTE — Patient Instructions (Signed)
[facility]  Discharge Instructions: Thank you for choosing [facility] to provide your oncology and hematology care.   If you have a lab appointment with the [facility], please go directly to the [facility] and check in at the registration area.   Wear comfortable clothing and clothing appropriate for easy access to any Portacath or PICC line.   We strive to give you quality time with your provider. You may need to reschedule your appointment if you arrive late (15 or more minutes).  Arriving late affects you and other patients whose appointments are after yours.  Also, if you miss three or more appointments without notifying the office, you may be dismissed from the clinic at the provider's discretion.      For prescription refill requests, have your pharmacy contact our office and allow 72 hours for refills to be completed.    Today you received the following chemotherapy and/or immunotherapy agents: Beryle Flock, alimta      To help prevent nausea and vomiting after your treatment, we encourage you to take your nausea medication as directed.  BELOW ARE SYMPTOMS THAT SHOULD BE REPORTED IMMEDIATELY: *FEVER GREATER THAN 100.4 F (38 C) OR HIGHER *CHILLS OR SWEATING *NAUSEA AND VOMITING THAT IS NOT CONTROLLED WITH YOUR NAUSEA MEDICATION *UNUSUAL SHORTNESS OF BREATH *UNUSUAL BRUISING OR BLEEDING *URINARY PROBLEMS (pain or burning when urinating, or frequent urination) *BOWEL PROBLEMS (unusual diarrhea, constipation, pain near the anus) TENDERNESS IN MOUTH AND THROAT WITH OR WITHOUT PRESENCE OF ULCERS (sore throat, sores in mouth, or a toothache) UNUSUAL RASH, SWELLING OR PAIN  UNUSUAL VAGINAL DISCHARGE OR ITCHING   Items with * indicate a potential emergency and should be followed up as soon as possible or go to the Emergency Department if any problems should occur.  Please show the CHEMOTHERAPY ALERT CARD or IMMUNOTHERAPY ALERT CARD at  check-in to the Emergency Department and triage nurse.  Should you have questions after your visit or need to cancel or reschedule your appointment, please contact [facility]  Dept: [phone]  and follow the prompts.  Office hours are 8:00 a.m. to 4:30 p.m. Monday - Friday. Please note that voicemails left after 4:00 p.m. may not be returned until the following business day.  We are closed weekends and major holidays. You have access to a nurse at all times for urgent questions. Please call the main number to the clinic Dept: [phone] and follow the prompts.   For any non-urgent questions, you may also contact your provider using MyChart. We now offer e-Visits for anyone 35 and older to request care online for non-urgent symptoms. For details visit [email or web address].   Also download the MyChart app! Go to the app store, search "MyChart", open the app, select [facility], and log in with your MyChart username and password.  Masks are optional in the cancer centers. If you would like for your care team to wear a mask while they are taking care of you, please let them know. You may have one support person who is at least 68 years old accompany you for your appointments.

## 2021-10-01 LAB — T4: T4, Total: 7.7 ug/dL (ref 4.5–12.0)

## 2021-10-03 ENCOUNTER — Telehealth: Payer: Self-pay | Admitting: *Deleted

## 2021-10-03 ENCOUNTER — Other Ambulatory Visit: Payer: Self-pay

## 2021-10-03 MED ORDER — MORPHINE SULFATE 15 MG PO TABS: 15.0000 mg | ORAL_TABLET | Freq: Four times a day (QID) | ORAL | 0 refills | Status: DC | PRN

## 2021-10-03 MED ORDER — MORPHINE SULFATE ER 15 MG PO TBCR: 15.0000 mg | EXTENDED_RELEASE_TABLET | Freq: Two times a day (BID) | ORAL | 0 refills | Status: DC

## 2021-10-03 NOTE — Telephone Encounter (Signed)
Received call from pt requesting refills for his MSContin and Morphine 15 mg short acting. It appears neither have been refilled since 07/29/21 and  07/19/21, respectively.

## 2021-10-05 ENCOUNTER — Encounter: Payer: Self-pay | Admitting: Urology

## 2021-10-05 NOTE — Progress Notes (Signed)
Radiation Oncology         [phone]) [phone] ________________________________  Name: Terry Page. MRN: [id number]  Date: 10/06/2021  DOB: October 08, 1955  Post Treatment Note  CC: [name], MD  Orson Slick, MD  Diagnosis:   66 y.o. 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   Interval Since Last Radiation:  6 weeks  08/11/21 - 08/24/21:   The painful site of metastasis in the left shoulder was treated to 30 Gy in 10 fractions of 3 Gy each.  04/08/20 - 04/19/20: Left lung (SBRT x3)- [place]   08/19/19 - 10/07/19:   The prostate was treated to 70 Gy in 28 fractions of 2.5 Gy Tammi Klippel)   04/03/18 - 04/12/18: LUL / 54 Gy in 3 fractions (SBRT x3)- [place]  Narrative:  I spoke with the patient to conduct his routine scheduled 1 month follow up visit via telephone to spare the patient unnecessary potential exposure in the healthcare setting during the current COVID-19 pandemic.  The patient was notified in advance and gave permission to proceed with this visit format.  He tolerated the treatment well without any ill side effects and did experience some pain relief towards the completion of treatment.                              On review of systems, the patient states that he is doing very well in general.  He has noticed improvement in the left shoulder pain and is currently without complaints.  He has continued on systemic therapy under the care and direction of Dr. Lorenso Courier and feels that he is tolerating this fairly well.  Overall, he is pleased with his progress to date.  ALLERGIES:  has No Known Allergies.  Meds: Current Outpatient Medications  Medication Sig Dispense Refill   acetaminophen (TYLENOL) 325 MG tablet Take 2 tablets (650 mg total) by mouth every 6 (six) hours as needed for mild pain or headache (fever >/= 101).     albuterol (VENTOLIN HFA) 108 (90 Base) MCG/ACT inhaler Inhale 2 puffs into the lungs every 6 (six) hours as  needed for wheezing or shortness of breath. 8 g 1   amLODipine (NORVASC) 10 MG tablet Take 1 tablet by mouth daily.     atorvastatin (LIPITOR) 20 MG tablet Take 20 mg by mouth daily.     docusate sodium (COLACE) 100 MG capsule Take 100 mg by mouth 2 (two) times daily.     folic acid (FOLVITE) 1 MG tablet Take 1 tablet (1 mg total) by mouth daily. 30 tablet 3   hydrOXYzine (ATARAX) 25 MG tablet Take 1 tablet (25 mg total) by mouth at bedtime as needed for itching. 30 tablet 11   ipratropium-albuterol (DUONEB) 0.5-2.5 (3) MG/3ML SOLN Take 3 mLs by nebulization every 6 (six) hours as needed. (Patient taking differently: Take 3 mLs by nebulization every 6 (six) hours as needed (shortness of breathing or wheezing).) 360 mL 0   lidocaine (LIDODERM) 5 % Place 1 patch onto the skin daily. Remove & Discard patch within 12 hours or as directed by MD. Apply to low back 30 patch 0   lidocaine-prilocaine (EMLA) cream Apply 1 Application topically as needed. 30 g 0   losartan (COZAAR) 100 MG tablet Take 100 mg by mouth daily. (Patient not taking: Reported on 09/30/2021)     morphine (MS CONTIN) 15 MG 12 hr tablet  Take 1 tablet (15 mg total) by mouth every 12 (twelve) hours. 60 tablet 0   morphine (MSIR) 15 MG tablet Take 1 tablet (15 mg total) by mouth every 6 (six) hours as needed for severe pain. 60 tablet 0   olmesartan (BENICAR) 40 MG tablet Take 40 mg by mouth daily.     ondansetron (ZOFRAN) 8 MG tablet Take 1 tablet (8 mg total) by mouth every 8 (eight) hours as needed. 30 tablet 0   oxyCODONE (OXY IR/ROXICODONE) 5 MG immediate release tablet 1 tablet as needed     polyethylene glycol (MIRALAX) 17 g packet Take 17 g by mouth daily. 30 each 2   potassium chloride SA (KLOR-CON M) 20 MEQ tablet Take 1 tablet (20 mEq total) by mouth daily. 30 tablet 1   prochlorperazine (COMPAZINE) 10 MG tablet Take 1 tablet (10 mg total) by mouth every 6 (six) hours as needed for nausea or vomiting. 30 tablet 0    senna-docusate (SENNA S) 8.6-50 MG tablet Take 2 tablets by mouth daily. 60 tablet 2   thiamine 100 MG tablet Take 1 tablet (100 mg total) by mouth daily.     No current facility-administered medications for this visit.    Physical Findings:  vitals were not taken for this visit.   /10 Unable to assess due to telephone follow-up visit format.  Lab Findings: Lab Results  Component Value Date   WBC 7.7 09/30/2021   HGB 8.2 (L) 09/30/2021   HCT 24.1 (L) 09/30/2021   MCV 88.9 09/30/2021   PLT 293 09/30/2021     Radiographic Findings: CT CHEST ABDOMEN PELVIS W CONTRAST  Result Date: 09/24/2021 CLINICAL DATA:  Metastatic lung cancer restaging, osseous metastases, additional history of prostate cancer status post radiation * Tracking Code: BO * EXAM: CT CHEST, ABDOMEN, AND PELVIS WITH CONTRAST TECHNIQUE: Multidetector CT imaging of the chest, abdomen and pelvis was performed following the standard protocol during bolus administration of intravenous contrast. RADIATION DOSE REDUCTION: This exam was performed according to the departmental dose-optimization program which includes automated exposure control, adjustment of the mA and/or kV according to patient size and/or use of iterative reconstruction technique. CONTRAST:  119mL OMNIPAQUE IOHEXOL 300 MG/ML SOLN additional oral enteric contrast COMPARISON:  CT chest, 08/10/2021 CT chest abdomen pelvis, 04/28/2021 FINDINGS: CT CHEST FINDINGS Cardiovascular: Right chest port catheter. Aortic atherosclerosis. Normal heart size. Three-vessel coronary artery calcifications no pericardial effusion. Mediastinum/Nodes: No enlarged mediastinal, hilar, or axillary lymph nodes. Thyroid gland, trachea, and esophagus demonstrate no significant findings. Lungs/Pleura: Severe, bullous emphysema, worst in the left apex. Unchanged post treatment scarring and fibrosis about a previously seen nodule in the central left upper lobe, although difficult to distinguish  against adjacent fibrosis this is a proximally 1.1 x 1.0 cm, unchanged (series 4, image 47). Additional fibrosis and consolidation about the left hilum (series 4, image 64). New small irregular nodule and adjacent ground-glass in the peripheral right upper lobe measuring 1.0 x 0.6 cm (series 4, image 66). No pleural effusion or pneumothorax. Musculoskeletal: No chest wall abnormality. No acute osseous findings. Similar appearance of extensive mixed lytic and sclerotic osseous metastatic disease involving the left scapula as seen on recent prior examination dated 08/10/2021 (series 2, image 13). Unchanged, subtle sclerotic lesion of the anterior left fifth rib (series 4, image 84). Unchanged, subtle superior endplate deformity of D66. CT ABDOMEN PELVIS FINDINGS Hepatobiliary: Multiple low-attenuation liver lesions, unchanged, largest of which are clearly fluid attenuation simple cysts, others too small to characterize although  most likely additional benign cysts. No gallstones, gallbladder wall thickening, or biliary dilatation. Pancreas: Unremarkable. No pancreatic ductal dilatation or surrounding inflammatory changes. 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 Adrenals/Urinary Tract: Adrenal glands are unremarkable. Simple, benign bilateral renal cortical cysts, for which no further follow-up or characterization is required. Unchanged prominent left renal pelvis with abrupt caliber change at the ureteropelvic junction. Bladder is unremarkable. Stomach/Bowel: Stomach is within normal limits. Appendix is not clearly visualized. No evidence of bowel wall thickening, distention, or inflammatory changes. Vascular/Lymphatic: Aortic atherosclerosis. No enlarged abdominal or pelvic lymph nodes. Reproductive: No mass or other abnormality. Other: No abdominal wall hernia or abnormality. No ascites. Musculoskeletal: No acute osseous findings. Multiple mixed lytic and sclerotic osseous metastatic lesions are again seen  involving the pelvis, particularly the left sacral ala, posterior left ilium (series 2, image 97) and right acetabulum (series 2, image 101), these lesions demonstrating substantial interval bony healing in comparison to most recent imaging of the pelvis dated 04/28/2021. There are however newly sclerotic osseous metastatic lesions of the pelvis, including a sclerotic pathologic fracture of the right pubic symphysis (series 2, image 116) and a new sclerotic lesion of the left ischial ramus and inferior pubic ramus (series 2, image 124). IMPRESSION: 1. Unchanged post treatment/post radiation scarring and fibrosis about a previously seen nodule in the central left upper lobe as well as about the left hilum. 2. New small irregular nodule and adjacent ground-glass in the peripheral right upper lobe measuring 1.0 x 0.6 cm, presumably infectious or inflammatory given short interval development. Attention on follow-up. 3. Similar appearance of extensive mixed lytic and sclerotic osseous metastatic disease involving the left scapula as seen on recent prior examination dated 08/10/2021. Unchanged, subtle sclerotic lesion of the anterior left fifth rib, which may reflect a chronic fracture deformity with or without an underlying small metastasis. 4. Multiple mixed lytic and sclerotic osseous metastatic lesions are again seen involving the pelvis, particularly the left sacral ala, posterior left ilium and right acetabulum, these lesions demonstrating substantial interval bony healing in comparison to most recent imaging of the pelvis dated 04/28/2021. There are however newly sclerotic osseous metastatic lesions of the pelvis, including a sclerotic nondisplaced pathologic fracture of the right pubic symphysis and a new sclerotic lesion of the left ischial ramus and inferior pubic ramus consistent with worsened, although now treated osseous metastatic disease in the pelvis in the interval from 04/28/2021. 5. No evidence of soft  tissue metastatic disease or lymphadenopathy in the chest, abdomen, or pelvis. 6. Unchanged chronic left ureteropelvic junction stricture. 7. Coronary artery disease. Aortic Atherosclerosis (ICD10-I70.0). Electronically Signed   By: Delanna Ahmadi M.D.   On: 09/24/2021 13:49    Impression/Plan: 1. 66 y.o. 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  He appears to have recovered well from the effects of his recent palliative radiation to the left shoulder and is currently without complaints.  We discussed that while we are happy to continue to participate in his care if clinically indicated, at this point, we will plan to see him back on an as-needed basis.  He will continue in routine follow-up under the care and direction of Dr. Lorenso Courier for continued management of his systemic disease.  He knows that he is welcome to call at anytime with any questions or concerns related to his previous radiation.    Nicholos Johns, PA-C

## 2021-10-05 NOTE — Progress Notes (Signed)
  Radiation Oncology         [phone]) [phone] ________________________________  Name: Terry Page. MRN: [id number]  Date: 08/24/2021  DOB: 11-Oct-1955  End of Treatment Note  Diagnosis:   66 y.o. 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      Indication for treatment:  Palliation       Radiation treatment dates:   08/11/21 - 08/24/21  Site/dose:   The painful site of metastasis in the left shoulder was treated to 30 Gy in 10 fractions of 3 Gy each.  Beams/energy:   A 3D field set-up was employed with 6 MV X-rays  Narrative: The patient tolerated radiation treatment relatively well.    Plan: The patient has completed radiation treatment. The patient will return to radiation oncology clinic for routine followup in one month. I advised him to call or return sooner if he has any questions or concerns related to his recovery or treatment. ________________________________  Sheral Apley. Tammi Klippel, M.D.

## 2021-10-05 NOTE — Progress Notes (Signed)
Telephone nursing appointment for prostate. I verified patient's identity and began nursing interview. Patient doing well. No issues reported at this time.  Meaningful use complete. I-PSS score of 2-mild. No urinary management medications. Urology appt- Nov, 2023  Patient aware of his 10:00am-10/06/21 telephone appointment w/ Ashlyn Bruning PA-C. I left my extension [phone] in case patient needs anything. Patient verbalized understanding.  Patient contact [phone]

## 2021-10-06 ENCOUNTER — Ambulatory Visit
Admission: RE | Admit: 2021-10-06 | Discharge: 2021-10-06 | Disposition: A | Discharge status: Home / Self Care | Payer: Medicare HMO | Source: Ambulatory Visit | Attending: Urology | Admitting: Urology

## 2021-10-06 ENCOUNTER — Other Ambulatory Visit: Payer: Self-pay | Admitting: Hematology and Oncology

## 2021-10-06 ENCOUNTER — Other Ambulatory Visit: Payer: Self-pay | Admitting: *Deleted

## 2021-10-06 DIAGNOSIS — C7951 Secondary malignant neoplasm of bone: Secondary | ICD-10-CM

## 2021-10-06 MED ORDER — SENNOSIDES-DOCUSATE SODIUM 8.6-50 MG PO TABS: 2.0000 | ORAL_TABLET | Freq: Every day | ORAL | 2 refills | Status: DC

## 2021-10-07 ENCOUNTER — Other Ambulatory Visit: Payer: Self-pay | Admitting: *Deleted

## 2021-10-07 MED ORDER — POTASSIUM CHLORIDE CRYS ER 20 MEQ PO TBCR: EXTENDED_RELEASE_TABLET | ORAL | 0 refills | Status: DC

## 2021-10-12 ENCOUNTER — Encounter: Payer: Self-pay | Admitting: Nurse Practitioner

## 2021-10-12 ENCOUNTER — Inpatient Hospital Stay: Payer: Medicare HMO | Attending: Hematology and Oncology | Admitting: Nurse Practitioner

## 2021-10-12 DIAGNOSIS — Z5111 Encounter for antineoplastic chemotherapy: Secondary | ICD-10-CM | POA: Insufficient documentation

## 2021-10-12 DIAGNOSIS — Z87891 Personal history of nicotine dependence: Secondary | ICD-10-CM | POA: Insufficient documentation

## 2021-10-12 DIAGNOSIS — K5900 Constipation, unspecified: Secondary | ICD-10-CM | POA: Insufficient documentation

## 2021-10-12 DIAGNOSIS — Z515 Encounter for palliative care: Secondary | ICD-10-CM | POA: Insufficient documentation

## 2021-10-12 DIAGNOSIS — F1021 Alcohol dependence, in remission: Secondary | ICD-10-CM | POA: Insufficient documentation

## 2021-10-12 DIAGNOSIS — C3412 Malignant neoplasm of upper lobe, left bronchus or lung: Secondary | ICD-10-CM | POA: Insufficient documentation

## 2021-10-12 DIAGNOSIS — I10 Essential (primary) hypertension: Secondary | ICD-10-CM | POA: Insufficient documentation

## 2021-10-12 DIAGNOSIS — Z923 Personal history of irradiation: Secondary | ICD-10-CM | POA: Insufficient documentation

## 2021-10-12 DIAGNOSIS — K5903 Drug induced constipation: Secondary | ICD-10-CM

## 2021-10-12 DIAGNOSIS — Z5112 Encounter for antineoplastic immunotherapy: Secondary | ICD-10-CM | POA: Insufficient documentation

## 2021-10-12 DIAGNOSIS — R530 Neoplastic (malignant) related fatigue: Secondary | ICD-10-CM

## 2021-10-12 DIAGNOSIS — G893 Neoplasm related pain (acute) (chronic): Secondary | ICD-10-CM | POA: Diagnosis not present

## 2021-10-12 DIAGNOSIS — Z8546 Personal history of malignant neoplasm of prostate: Secondary | ICD-10-CM | POA: Insufficient documentation

## 2021-10-12 DIAGNOSIS — Z79899 Other long term (current) drug therapy: Secondary | ICD-10-CM | POA: Insufficient documentation

## 2021-10-12 NOTE — Progress Notes (Signed)
i    [place]  [phone] [phone]   Name: Terry Page. Date: 10/12/2021 MRN: [id number]  DOB: 11-28-1955  Patient Care Team: [name], MD as PCP - General (Family Medicine)   I connected with [name]. on 10/12/21 at 10:00 AM EDT by phone and verified that I am speaking with the correct person using two identifiers.   I discussed the limitations, risks, security and privacy concerns of performing an evaluation and management service by telemedicine and the availability of in-person appointments. I also discussed with the patient that there may be a patient responsible charge related to this service. The patient expressed understanding and agreed to proceed.   Other persons participating in the visit and their role in the encounter:    Patient's location: Home Provider's location: [facility]    INTERVAL HISTORY: Terry Page. is a 66 y.o. 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  Palliative ask to see for symptom management.  SOCIAL HISTORY:     reports that he quit smoking about 22 months ago. His smoking use included cigarettes. He has never used smokeless tobacco. He reports that he does not currently use alcohol after a past usage of about 12.0 standard drinks of alcohol per week. He reports that he does not use drugs.  ADVANCE DIRECTIVES:  None on file   CODE STATUS:   PAST MEDICAL HISTORY: Past Medical History:  Diagnosis Date   Alcohol abuse    Asthma    Bullous emphysema ([place])    Essential hypertension 04/10/2020   Hemorrhoids    History of radiation therapy 04/08/20-04/19/20   IMRT- Left lung- Dr. Gery Pray   Incidental lung nodule, greater than or equal to 46mm 10/22/2017   Left upper lobe - discovered on CTA    Prostate cancer ([place])    Spontaneous pneumothorax 10/20/2017   right   Tobacco abuse     ALLERGIES:  has No Known Allergies.  MEDICATIONS:  Current Outpatient Medications  Medication Sig Dispense Refill   acetaminophen (TYLENOL) 325 MG tablet Take 2 tablets (650 mg total) by mouth every 6 (six) hours as needed for mild pain or headache (fever >/= 101).     albuterol (VENTOLIN HFA) 108 (90 Base) MCG/ACT inhaler Inhale 2 puffs into the lungs every 6 (six) hours as needed for wheezing or shortness of breath. 8 g 1   amLODipine (NORVASC) 10 MG tablet Take 1 tablet by mouth daily.     atorvastatin (LIPITOR) 20 MG tablet Take 20 mg by mouth daily.     docusate sodium (COLACE) 100 MG capsule Take 100 mg by mouth 2 (two) times daily.     folic acid (FOLVITE) 1 MG tablet Take 1 tablet (1 mg total) by mouth daily. 30 tablet 3   hydrOXYzine (ATARAX) 25 MG tablet Take 1 tablet (25 mg total) by mouth at bedtime as needed for itching. 30 tablet 11   ipratropium-albuterol (DUONEB) 0.5-2.5 (3) MG/3ML SOLN Take 3 mLs by nebulization every 6 (six) hours as needed. (Patient taking differently: Take 3 mLs by nebulization every 6 (six) hours as needed (shortness of breathing or wheezing).) 360 mL 0   lidocaine (LIDODERM) 5 % Place 1 patch onto the skin daily. Remove & Discard patch within 12 hours or as directed by MD. Apply to low  back 30 patch 0   lidocaine-prilocaine (EMLA) cream Apply 1 Application topically as needed. 30 g 0   losartan (COZAAR) 100 MG tablet Take 100 mg by mouth daily. (Patient not taking: Reported on 09/30/2021)     morphine (MS CONTIN) 15 MG 12 hr tablet Take 1 tablet (15 mg total) by mouth every 12 (twelve) hours. 60 tablet 0   morphine (MSIR) 15 MG tablet Take 1 tablet (15 mg total) by mouth every 6 (six) hours as needed for severe pain. 60 tablet 0   olmesartan (BENICAR) 40 MG tablet Take 40 mg by mouth daily.     ondansetron (ZOFRAN) 8 MG tablet Take 1 tablet (8 mg total) by mouth  every 8 (eight) hours as needed. 30 tablet 0   polyethylene glycol (MIRALAX) 17 g packet Take 17 g by mouth daily. 30 each 2   potassium chloride SA (KLOR-CON M) 20 MEQ tablet TAKE 1 TABLET(20 MEQ) BY MOUTH DAILY 90 tablet 0   prochlorperazine (COMPAZINE) 10 MG tablet Take 1 tablet (10 mg total) by mouth every 6 (six) hours as needed for nausea or vomiting. 30 tablet 0   senna-docusate (SENNA S) 8.6-50 MG tablet Take 2 tablets by mouth daily. 60 tablet 2   thiamine 100 MG tablet Take 1 tablet (100 mg total) by mouth daily.     No current facility-administered medications for this visit.    VITAL SIGNS: There were no vitals taken for this visit. There were no vitals filed for this visit.  Estimated body mass index is 22.7 kg/m as calculated from the following:   Height as of 09/01/21: 5\' 8"  (1.727 m).   Weight as of 09/30/21: 149 lb 4.8 oz (67.7 kg).   PERFORMANCE STATUS (ECOG) : 1 - Symptomatic but completely ambulatory   IMPRESSION: I connected with Mr. Mcmartin by phone for symptom management follow-up. Denies any acute distress. Denies nausea, vomiting, or diarrhea.  Continues to feel well.  Appetite is good.  Neoplasm related pain  Pain is much better. He is not requiring MS IR daily.  He is only taking if pain is severe which generally occurs with increased activity.  Encouraged him to listen to his body and not overdo it.  I am glad his pain is much improved. We discussed plans to begin weaning down off of the MS Contin in the near future as he continue to show improvement.   We will continue to closely monitor.   Constipation  Constipation is improved. He is taking Miralax and Senna as recommended.   I discussed the importance of continued conversation with family and their medical providers regarding overall plan of care and treatment options, ensuring decisions are within the context of the patients values and GOCs.  PLAN: Continue with MS Contin 15mg  daily. MS IR 15 mg as  needed.  Not taking regularly  Miralax/Senna daily  I will plan to see patient back in 4-6 weeks in collaboration with his other oncology appointments. Will give him a call next week for close symptom follow-up.    Patient expressed understanding and was in agreement with this plan. He also understands that He can call the clinic at any time with any questions, concerns, or complaints.       Any controlled substances utilized were prescribed in the context of palliative care. PDMP has been reviewed.    Time Total: 35 min   Visit consisted of counseling and education dealing with the complex and emotionally intense issues of symptom  management and palliative care in the setting of serious and potentially life-threatening illness.Greater than 50%  of this time was spent counseling and coordinating care related to the above assessment and plan.  Alda Lea, AGPCNP-BC  Palliative Medicine Team/[facility]

## 2021-10-17 ENCOUNTER — Telehealth: Payer: Self-pay | Admitting: *Deleted

## 2021-10-17 NOTE — Telephone Encounter (Signed)
Received on call message that needs a refill on one of his medications.  TCT patient and spoke with him. He states he needs a refill on his Atorvastatin. Advised that this refill would need to come from his PCP. Pt voiced understanding. He will call that office today.

## 2021-10-21 ENCOUNTER — Other Ambulatory Visit: Payer: Self-pay | Admitting: *Deleted

## 2021-10-21 ENCOUNTER — Inpatient Hospital Stay: Payer: Medicare HMO | Admitting: Dietician

## 2021-10-21 ENCOUNTER — Inpatient Hospital Stay: Payer: Medicare HMO

## 2021-10-21 ENCOUNTER — Inpatient Hospital Stay (HOSPITAL_BASED_OUTPATIENT_CLINIC_OR_DEPARTMENT_OTHER): Payer: Medicare HMO | Admitting: Hematology and Oncology

## 2021-10-21 ENCOUNTER — Other Ambulatory Visit: Payer: Self-pay

## 2021-10-21 VITALS — BP 108/71 | HR 91 | Temp 97.8°F | Resp 16 | Wt 148.2 lb

## 2021-10-21 DIAGNOSIS — D649 Anemia, unspecified: Secondary | ICD-10-CM

## 2021-10-21 DIAGNOSIS — I10 Essential (primary) hypertension: Secondary | ICD-10-CM | POA: Diagnosis not present

## 2021-10-21 DIAGNOSIS — G893 Neoplasm related pain (acute) (chronic): Secondary | ICD-10-CM | POA: Diagnosis not present

## 2021-10-21 DIAGNOSIS — Z5112 Encounter for antineoplastic immunotherapy: Secondary | ICD-10-CM | POA: Diagnosis present

## 2021-10-21 DIAGNOSIS — K5900 Constipation, unspecified: Secondary | ICD-10-CM | POA: Diagnosis not present

## 2021-10-21 DIAGNOSIS — Z95828 Presence of other vascular implants and grafts: Secondary | ICD-10-CM

## 2021-10-21 DIAGNOSIS — Z87891 Personal history of nicotine dependence: Secondary | ICD-10-CM | POA: Diagnosis not present

## 2021-10-21 DIAGNOSIS — C3412 Malignant neoplasm of upper lobe, left bronchus or lung: Secondary | ICD-10-CM

## 2021-10-21 DIAGNOSIS — F1021 Alcohol dependence, in remission: Secondary | ICD-10-CM | POA: Diagnosis not present

## 2021-10-21 DIAGNOSIS — Z79899 Other long term (current) drug therapy: Secondary | ICD-10-CM | POA: Diagnosis not present

## 2021-10-21 DIAGNOSIS — Z8546 Personal history of malignant neoplasm of prostate: Secondary | ICD-10-CM | POA: Diagnosis not present

## 2021-10-21 DIAGNOSIS — Z923 Personal history of irradiation: Secondary | ICD-10-CM | POA: Diagnosis not present

## 2021-10-21 DIAGNOSIS — Z5111 Encounter for antineoplastic chemotherapy: Secondary | ICD-10-CM | POA: Diagnosis present

## 2021-10-21 DIAGNOSIS — Z515 Encounter for palliative care: Secondary | ICD-10-CM | POA: Diagnosis not present

## 2021-10-21 LAB — CMP (CANCER CENTER ONLY)
ALT: 10 U/L (ref 0–44)
AST: 16 U/L (ref 15–41)
Albumin: 3.4 g/dL — ABNORMAL LOW (ref 3.5–5.0)
Alkaline Phosphatase: 60 U/L (ref 38–126)
Anion gap: 5 (ref 5–15)
BUN: 17 mg/dL (ref 8–23)
CO2: 21 mmol/L — ABNORMAL LOW (ref 22–32)
Calcium: 9.3 mg/dL (ref 8.9–10.3)
Chloride: 106 mmol/L (ref 98–111)
Creatinine: 1.06 mg/dL (ref 0.61–1.24)
GFR, Estimated: >60 mL/min (ref >60)
Glucose, Bld: 91 mg/dL (ref 70–99)
Potassium: 4.7 mmol/L (ref 3.5–5.1)
Sodium: 132 mmol/L — ABNORMAL LOW (ref 135–145)
Total Bilirubin: 0.2 mg/dL — ABNORMAL LOW (ref 0.3–1.2)
Total Protein: 7.6 g/dL (ref 6.5–8.1)

## 2021-10-21 LAB — CBC WITH DIFFERENTIAL (CANCER CENTER ONLY)
Abs Immature Granulocytes: 0.1 10*3/uL — ABNORMAL HIGH (ref 0.00–0.07)
Basophils Absolute: 0 10*3/uL (ref 0.0–0.1)
Basophils Relative: 1 %
Eosinophils Absolute: 0.3 10*3/uL (ref 0.0–0.5)
Eosinophils Relative: 3 %
HCT: 22.1 % — ABNORMAL LOW (ref 39.0–52.0)
Hemoglobin: 7.2 g/dL — ABNORMAL LOW (ref 13.0–17.0)
Immature Granulocytes: 1 %
Lymphocytes Relative: 14 %
Lymphs Abs: 1.2 10*3/uL (ref 0.7–4.0)
MCH: 30.5 pg (ref 26.0–34.0)
MCHC: 32.6 g/dL (ref 30.0–36.0)
MCV: 93.6 fL (ref 80.0–100.0)
Monocytes Absolute: 1.2 10*3/uL — ABNORMAL HIGH (ref 0.1–1.0)
Monocytes Relative: 13 %
Neutro Abs: 5.9 10*3/uL (ref 1.7–7.7)
Neutrophils Relative %: 68 %
Platelet Count: 463 10*3/uL — ABNORMAL HIGH (ref 150–400)
RBC: 2.36 MIL/uL — ABNORMAL LOW (ref 4.22–5.81)
RDW: 21.4 % — ABNORMAL HIGH (ref 11.5–15.5)
WBC Count: 8.6 10*3/uL (ref 4.0–10.5)
nRBC: 0 % (ref 0.0–0.2)

## 2021-10-21 LAB — TSH: TSH: 1.615 u[IU]/mL (ref 0.350–4.500)

## 2021-10-21 MED ORDER — SODIUM CHLORIDE 0.9 % IV SOLN
200.0000 mg | Freq: Once | INTRAVENOUS | Status: AC
  Administered 2021-10-21: 200 mg via INTRAVENOUS
  Filled 2021-10-21: qty 200

## 2021-10-21 MED ORDER — HEPARIN SOD (PORK) LOCK FLUSH 100 UNIT/ML IV SOLN
500.0000 [IU] | Freq: Once | INTRAVENOUS | Status: AC | PRN
  Administered 2021-10-21: 500 [IU]

## 2021-10-21 MED ORDER — SODIUM CHLORIDE 0.9% FLUSH
10.0000 mL | INTRAVENOUS | Status: DC | PRN
  Administered 2021-10-21: 10 mL

## 2021-10-21 MED ORDER — SODIUM CHLORIDE 0.9% FLUSH
10.0000 mL | Freq: Once | INTRAVENOUS | Status: AC
  Administered 2021-10-21: 10 mL

## 2021-10-21 MED ORDER — SODIUM CHLORIDE 0.9 % IV SOLN: Freq: Once | INTRAVENOUS | Status: AC

## 2021-10-21 NOTE — Progress Notes (Signed)
[place] [phone]   [phone]  PROGRESS NOTE  Patient Care Team: [name], MD as PCP - General (Family Medicine)  Hematological/Oncological History # Metastatic Adenocarcinoma of the Lung  01/07/2020 : CT of the chest with contrast performed which showed interval enlargement of a spiculated nodule in the central left upper lobe measuring 1.2 x 1.0 cm and highly suspicious for primary lung malignancy 02/06/2020 :PET scan performed which showed a hypermetabolic irregular solid 1.3 cm left upper lobe pulmonary nodule compatible with malignancy without any other hypermetabolic lesions 03/07/5730- 04/19/2020:  SBRT to the lung lesion 04/28/2021: CT C/A/P showed multifocal lytic bone metastases are identified. Lesion within the spine of the left scapula and lesions involving bilateral iliac bones and left sacral wing. Establish care with Dr. Lorenso Courier  05/03/2021: biopsy of right iliac lytic lesion showed metastatic carcinoma, consistent with lung primary.  07/08/2021: Cycle 1 Day 1 of Carbo/Pem/Pem 07/29/2021: Cycle 2 Day 1 of Carbo/Pem/Pem 08/11/2021-08/24/2021: Received palliative radiation to osseous metastasis in the left shoulder. 30 Gy in 10 fractions.  08/19/2021: Cycle 3 Day 1 of Carbo/Pem/Pem 09/09/2021: Cycle 4 Day 1 of Carbo/Pem/Pem 09/23/2021: CT CAP: stable disease 09/30/2021: Cycle 5 Day 1 of Pem/Pem 10/21/2021: Cycle 6 Day 1 of Pembrolizumab. Held pemetrexed due to anemia.   #Adenocarcinoma of the Prostate, T1cN0M0 08/19/2019-10/07/2019: 70 Gy in 28 fractions of 2.5 Gy.  Radiation to the prostate was under the care of Dr. Tyler Pita  Interval History:  Terry Page. 66 y.o. male with medical history significant for metastatic adenocarcinoma of the lung who presents for a follow up visit. The patient's last visit was on 09/30/2021. In the interim since the last visit he has completed Cycle 5 of chemotherapy.  On exam today Mr. Boulay reports he  tolerated the last treatment quite well.  He reports that he is in a lot less pain.  He is currently having pain mostly in his left shoulder and lower back and rates it as a 4 out of 10.  He is working with palliative care for pain management he is taking long-acting morphine twice daily as well as a as needed only about once daily.  He reports occasionally he "forgets to take" because he is not in pain.  Reports that his bowels are moving well without any difficulty.  His appetite is good he is tolerating chemotherapy without any nausea, ming, or diarrhea.  He notes that he does occasionally have some sensation like his feet are cold and walking on ice but that it is short-lived.  His foot does have some occasional itching.  He is not having any issues with low energy, dizziness, shortness of breath.  He denies any fevers, chills, night sweats, shortness of breath, chest pain, cough, peripheral edema or neuropathy.  He has no other complaints.A full 10 point ROS is listed below.  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 Gery Pray   Incidental lung nodule, greater than or equal to 68mm 10/22/2017   Left upper lobe - discovered on CTA   Prostate cancer ([place])    Spontaneous pneumothorax 10/20/2017   right   Tobacco abuse     SURGICAL HISTORY: Past Surgical History:  Procedure Laterality Date   BACK SURGERY  IR IMAGING GUIDED PORT INSERTION  06/29/2021   PROSTATE BIOPSY      SOCIAL HISTORY: Social History   Socioeconomic History   Marital status: Single    Spouse name: Not on file   Number of children: Not on file   Years of education: Not on file   Highest education level: Not on file  Occupational History   Occupation: retired  Tobacco Use   Smoking status: Former    Types: Cigarettes     Quit date: 11/22/2019    Years since quitting: 1.9   Smokeless tobacco: Never   Tobacco comments:    Patient reports quit 3 years ago. 06/25/20. [facility]  Vaping Use   Vaping Use: Never used  Substance and Sexual Activity   Alcohol use: Not Currently    Alcohol/week: 12.0 standard drinks of alcohol    Types: 12 Cans of beer per week    Comment: daily   Drug use: No   Sexual activity: Not Currently  Other Topics Concern   Not on file  Social History Narrative   Not on file   Social Determinants of Health   Financial Resource Strain: Not on file  Food Insecurity: Not on file  Transportation Needs: Unmet Transportation Needs (07/18/2021)   PRAPARE - Hydrologist (Medical): Yes    Lack of Transportation (Non-Medical): Yes  Physical Activity: Not on file  Stress: Not on file  Social Connections: Not on file  Intimate Partner Violence: Not on file    FAMILY HISTORY: Family History  Problem Relation Age of Onset   Cancer Cousin        maternal cousin   Cancer Cousin        paternal cousin   Cancer Cousin    Colon polyps Neg Hx    Pancreatic disease Neg Hx    Pancreatic cancer Neg Hx    Breast cancer Neg Hx    Colon cancer Neg Hx     ALLERGIES:  has No Known Allergies.  MEDICATIONS:  Current Outpatient Medications  Medication Sig Dispense Refill   acetaminophen (TYLENOL) 325 MG tablet Take 2 tablets (650 mg total) by mouth every 6 (six) hours as needed for mild pain or headache (fever >/= 101).     albuterol (VENTOLIN HFA) 108 (90 Base) MCG/ACT inhaler Inhale 2 puffs into the lungs every 6 (six) hours as needed for wheezing or shortness of breath. 8 g 1   amLODipine (NORVASC) 10 MG tablet Take 1 tablet by mouth daily.     atorvastatin (LIPITOR) 20 MG tablet Take 20 mg by mouth daily.     docusate sodium (COLACE) 100 MG capsule Take 100 mg by mouth 2 (two) times daily.     folic acid (FOLVITE) 1 MG tablet Take 1 tablet (1 mg total) by mouth  daily. 30 tablet 3   hydrOXYzine (ATARAX) 25 MG tablet Take 1 tablet (25 mg total) by mouth at bedtime as needed for itching. 30 tablet 11   ipratropium-albuterol (DUONEB) 0.5-2.5 (3) MG/3ML SOLN Take 3 mLs by nebulization every 6 (six) hours as needed. (Patient taking differently: Take 3 mLs by nebulization every 6 (six) hours as needed (shortness of breathing or wheezing).) 360 mL 0   lidocaine (LIDODERM) 5 % Place 1 patch onto the skin daily. Remove & Discard patch within 12 hours or as directed by MD. Apply to low back 30 patch 0   lidocaine-prilocaine (EMLA) cream Apply 1 Application topically as needed. Arnold  g 0   losartan (COZAAR) 100 MG tablet Take 100 mg by mouth daily. (Patient not taking: Reported on 09/30/2021)     morphine (MS CONTIN) 15 MG 12 hr tablet Take 1 tablet (15 mg total) by mouth every 12 (twelve) hours. 60 tablet 0   morphine (MSIR) 15 MG tablet Take 1 tablet (15 mg total) by mouth every 6 (six) hours as needed for severe pain. 60 tablet 0   olmesartan (BENICAR) 40 MG tablet Take 40 mg by mouth daily.     ondansetron (ZOFRAN) 8 MG tablet Take 1 tablet (8 mg total) by mouth every 8 (eight) hours as needed. 30 tablet 0   polyethylene glycol (MIRALAX) 17 g packet Take 17 g by mouth daily. 30 each 2   potassium chloride SA (KLOR-CON M) 20 MEQ tablet TAKE 1 TABLET(20 MEQ) BY MOUTH DAILY 90 tablet 0   prochlorperazine (COMPAZINE) 10 MG tablet Take 1 tablet (10 mg total) by mouth every 6 (six) hours as needed for nausea or vomiting. 30 tablet 0   senna-docusate (SENNA S) 8.6-50 MG tablet Take 2 tablets by mouth daily. 60 tablet 2   thiamine 100 MG tablet Take 1 tablet (100 mg total) by mouth daily.     No current facility-administered medications for this visit.    REVIEW OF SYSTEMS:   Constitutional: ( - ) fevers, ( - )  chills , ( - ) night sweats Eyes: ( - ) blurriness of vision, ( - ) double vision, ( - ) watery eyes Ears, nose, mouth, throat, and face: ( - ) mucositis, ( - )  sore throat Respiratory: ( - ) cough, ( - ) dyspnea, ( - ) wheezes Cardiovascular: ( - ) palpitation, ( - ) chest discomfort, ( - ) lower extremity swelling Gastrointestinal:  ( - ) nausea, ( - ) heartburn, ( - ) change in bowel habits Skin: ( - ) abnormal skin rashes Lymphatics: ( - ) new lymphadenopathy, ( - ) easy bruising Neurological: ( - ) numbness, ( - ) tingling, ( - ) new weaknesses Behavioral/Psych: ( - ) mood change, ( - ) new changes  All other systems were reviewed with the patient and are negative.  PHYSICAL EXAMINATION: ECOG PERFORMANCE STATUS: 1 - Symptomatic but completely ambulatory  Vitals:   10/21/21 0827  BP: 108/71  Pulse: 91  Resp: 16  Temp: 97.8 F (36.6 C)  SpO2: 100%   Filed Weights   10/21/21 0827  Weight: 148 lb 3.2 oz (67.2 kg)    GENERAL: Well-appearing elderly African-American male, alert, no distress and comfortable SKIN: skin color, texture, turgor are normal, no rashes or significant lesions EYES: conjunctiva are pink and non-injected, sclera clear LUNGS: clear to auscultation and percussion with normal breathing effort HEART: regular rate & rhythm and no murmurs and no lower extremity edema Musculoskeletal: no cyanosis of digits and no clubbing  PSYCH: alert & oriented x 3, fluent speech NEURO: no focal motor/sensory deficits  LABORATORY DATA:  I have reviewed the data as listed    Latest Ref Rng & Units 10/21/2021    7:56 AM 09/30/2021    7:57 AM 09/09/2021    8:40 AM  CBC  WBC 4.0 - 10.5 K/uL 8.6  7.7  7.5   Hemoglobin 13.0 - 17.0 g/dL 7.2  8.2  8.6   Hematocrit 39.0 - 52.0 % 22.1  24.1  24.7   Platelets 150 - 400 K/uL 463  293  308  Latest Ref Rng & Units 10/21/2021    7:56 AM 09/30/2021    7:57 AM 09/09/2021    8:40 AM  CMP  Glucose 70 - 99 mg/dL 91  114  108   BUN 8 - 23 mg/dL 17  11  12    Creatinine 0.61 - 1.24 mg/dL 1.06  0.94  0.93   Sodium 135 - 145 mmol/L 132  130  131   Potassium 3.5 - 5.1 mmol/L 4.7  4.8  4.3    Chloride 98 - 111 mmol/L 106  101  101   CO2 22 - 32 mmol/L 21  22  23    Calcium 8.9 - 10.3 mg/dL 9.3  9.2  9.8   Total Protein 6.5 - 8.1 g/dL 7.6  8.3  8.8   Total Bilirubin 0.3 - 1.2 mg/dL 0.2  0.3  0.2   Alkaline Phos 38 - 126 U/L 60  82  84   AST 15 - 41 U/L 16  15  15    ALT 0 - 44 U/L 10  11  11      Lab Results  Component Value Date   MPROTEIN Not Observed 04/28/2021   Lab Results  Component Value Date   KPAFRELGTCHN 33.6 (H) 04/28/2021   LAMBDASER 22.0 04/28/2021   KAPLAMBRATIO 1.53 04/28/2021    RADIOGRAPHIC STUDIES: CT CHEST ABDOMEN PELVIS W CONTRAST  Result Date: 09/24/2021 CLINICAL DATA:  Metastatic lung cancer restaging, osseous metastases, additional history of prostate cancer status post radiation * Tracking Code: BO * EXAM: CT CHEST, ABDOMEN, AND PELVIS WITH CONTRAST TECHNIQUE: Multidetector CT imaging of the chest, abdomen and pelvis was performed following the standard protocol during bolus administration of intravenous contrast. RADIATION DOSE REDUCTION: This exam was performed according to the departmental dose-optimization program which includes automated exposure control, adjustment of the mA and/or kV according to patient size and/or use of iterative reconstruction technique. CONTRAST:  125mL OMNIPAQUE IOHEXOL 300 MG/ML SOLN additional oral enteric contrast COMPARISON:  CT chest, 08/10/2021 CT chest abdomen pelvis, 04/28/2021 FINDINGS: CT CHEST FINDINGS Cardiovascular: Right chest port catheter. Aortic atherosclerosis. Normal heart size. Three-vessel coronary artery calcifications no pericardial effusion. Mediastinum/Nodes: No enlarged mediastinal, hilar, or axillary lymph nodes. Thyroid gland, trachea, and esophagus demonstrate no significant findings. Lungs/Pleura: Severe, bullous emphysema, worst in the left apex. Unchanged post treatment scarring and fibrosis about a previously seen nodule in the central left upper lobe, although difficult to distinguish against  adjacent fibrosis this is a proximally 1.1 x 1.0 cm, unchanged (series 4, image 47). Additional fibrosis and consolidation about the left hilum (series 4, image 64). New small irregular nodule and adjacent ground-glass in the peripheral right upper lobe measuring 1.0 x 0.6 cm (series 4, image 66). No pleural effusion or pneumothorax. Musculoskeletal: No chest wall abnormality. No acute osseous findings. Similar appearance of extensive mixed lytic and sclerotic osseous metastatic disease involving the left scapula as seen on recent prior examination dated 08/10/2021 (series 2, image 13). Unchanged, subtle sclerotic lesion of the anterior left fifth rib (series 4, image 84). Unchanged, subtle superior endplate deformity of M08. CT ABDOMEN PELVIS FINDINGS Hepatobiliary: Multiple low-attenuation liver lesions, unchanged, largest of which are clearly fluid attenuation simple cysts, others too small to characterize although most likely additional benign cysts. No gallstones, gallbladder wall thickening, or biliary dilatation. Pancreas: Unremarkable. No pancreatic ductal dilatation or surrounding inflammatory changes. 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 Adrenals/Urinary Tract: Adrenal glands are unremarkable. Simple, benign bilateral renal cortical cysts, for which no further  follow-up or characterization is required. Unchanged prominent left renal pelvis with abrupt caliber change at the ureteropelvic junction. Bladder is unremarkable. Stomach/Bowel: Stomach is within normal limits. Appendix is not clearly visualized. No evidence of bowel wall thickening, distention, or inflammatory changes. Vascular/Lymphatic: Aortic atherosclerosis. No enlarged abdominal or pelvic lymph nodes. Reproductive: No mass or other abnormality. Other: No abdominal wall hernia or abnormality. No ascites. Musculoskeletal: No acute osseous findings. Multiple mixed lytic and sclerotic osseous metastatic lesions are again seen involving  the pelvis, particularly the left sacral ala, posterior left ilium (series 2, image 97) and right acetabulum (series 2, image 101), these lesions demonstrating substantial interval bony healing in comparison to most recent imaging of the pelvis dated 04/28/2021. There are however newly sclerotic osseous metastatic lesions of the pelvis, including a sclerotic pathologic fracture of the right pubic symphysis (series 2, image 116) and a new sclerotic lesion of the left ischial ramus and inferior pubic ramus (series 2, image 124). IMPRESSION: 1. Unchanged post treatment/post radiation scarring and fibrosis about a previously seen nodule in the central left upper lobe as well as about the left hilum. 2. New small irregular nodule and adjacent ground-glass in the peripheral right upper lobe measuring 1.0 x 0.6 cm, presumably infectious or inflammatory given short interval development. Attention on follow-up. 3. Similar appearance of extensive mixed lytic and sclerotic osseous metastatic disease involving the left scapula as seen on recent prior examination dated 08/10/2021. Unchanged, subtle sclerotic lesion of the anterior left fifth rib, which may reflect a chronic fracture deformity with or without an underlying small metastasis. 4. Multiple mixed lytic and sclerotic osseous metastatic lesions are again seen involving the pelvis, particularly the left sacral ala, posterior left ilium and right acetabulum, these lesions demonstrating substantial interval bony healing in comparison to most recent imaging of the pelvis dated 04/28/2021. There are however newly sclerotic osseous metastatic lesions of the pelvis, including a sclerotic nondisplaced pathologic fracture of the right pubic symphysis and a new sclerotic lesion of the left ischial ramus and inferior pubic ramus consistent with worsened, although now treated osseous metastatic disease in the pelvis in the interval from 04/28/2021. 5. No evidence of soft tissue  metastatic disease or lymphadenopathy in the chest, abdomen, or pelvis. 6. Unchanged chronic left ureteropelvic junction stricture. 7. Coronary artery disease. Aortic Atherosclerosis (ICD10-I70.0). Electronically Signed   By: Delanna Ahmadi M.D.   On: 09/24/2021 13:49    ASSESSMENT & PLAN Terry Page. 66 y.o. male with medical history significant for newly diagnosed metastatic adenocarcinoma of the lung who presents for a follow up visit.  # Metastatic Adenocarcinoma of the Lung  -- NGS testing from this patient shows no evidence of targetable mutation. --MRI brain shows no evidence of intracranial metastases --Started carboplatin, pembrolizumab, and pemetrexed as treatment for his cancer on 07/08/2021. --Received palliative radiation to osseous metastasis of the left shoulder from 08/11/2021-08/24/2021. Planned dose is 30 Gy in 10 fx.  Plan: --today is Day 1 Cycle 6 of Pem/Pem for metastatic adenocarcinoma of the lung --labs from today were reviewed. Labs show white blood cell count 8.6, hemoglobin 7.2, MCV 93.6, and platelets of 463 --HOLDING pemetrexed due to anemia, will plan to restart next cycle if levels improve. Proceed with pembrolizumab today.  --CT C/A/P next due in Dec 2023. Last scan in Sept 2023 showed response to therapy.  --RTC in 3 weeks time port labs, f/u visit and Cycle 7, Day 1 of chemotherapy  #Normocytic anemia-worsening --Likely secondary to  chemotherapy --Hgb 7.2 today, stable from last visit at 8.2. Will HOLD pemextrexed today --return to clinic next week for labs and possible transfusion.  --Patient is not complaining of fatigue or worsening SOB. Monitor closely --continue to monitor closely  #Pain Control--improving -- Currently taking MS Contin 15 mg q 12 hours and tylenol for breakthrough pain. Only taking his short acting once daily.   --Palliative care currently following --Continue senna docusate for constipation prophylaxis --Continue to  monitor  #Supportive Care -- chemotherapy education complete -- port placed -- zofran 8mg  q8H PRN and compazine 10mg  PO q6H for nausea -- EMLA cream for port -- Patient referred to palliative care for pain control. Pain control as above.    Orders Placed This Encounter  Procedures   TSH    Standing Status:   Future    Standing Expiration Date:   12/02/2022   T4    Standing Status:   Future    Standing Expiration Date:   12/02/2022   CBC with Differential ([facility] Only)    Standing Status:   Future    Standing Expiration Date:   12/03/2022   CMP ([facility] only)    Standing Status:   Future    Standing Expiration Date:   12/03/2022   TSH    Standing Status:   Future    Standing Expiration Date:   12/23/2022   T4    Standing Status:   Future    Standing Expiration Date:   12/23/2022   CBC with Differential ([facility]cer [facility] Only)    Standing Status:   Future    Standing Expiration Date:   12/24/2022   CMP ([facility] only)    Standing Status:   Future    Standing Expiration Date:   12/24/2022   T4    Standing Status:   Future    Standing Expiration Date:   12/24/2022   TSH    Standing Status:   Future    Standing Expiration Date:   12/24/2022   TSH    Standing Status:   Future    Standing Expiration Date:   01/13/2023   T4    Standing Status:   Future    Standing Expiration Date:   01/13/2023   CBC with Differential ([facility] Only)    Standing Status:   Future    Standing Expiration Date:   01/14/2023   CMP ([facility] only)    Standing Status:   Future    Standing Expiration Date:   01/14/2023    All questions were answered. The patient knows to call the clinic with any problems, questions or concerns.  I have spent a total of 30 minutes minutes of face-to-face and non-face-to-face time, preparing to see the patient, performing a medically appropriate examination, counseling and educating the patient, referring and communicating with other health  care professionals, documenting clinical information in the electronic health record, and care coordination.   Ledell Peoples, MD Department of Hematology/Oncology [place] at [facility] Phone: [phone] Pager: [phone] Email: [email or web address]  10/21/2021 8:56 AM

## 2021-10-21 NOTE — Progress Notes (Signed)
OK to proceed with treatment today with Hgb of 7.2 per Dr. Lorenso Courier

## 2021-10-21 NOTE — Progress Notes (Signed)
Nutrition Follow-up:  Patient with metastatic adenocarcinoma of lung. He completed radiation to prostate (8/17-10/5) under the care of Dr. Tammi Klippel Patient is currently receiving Carboplatin, Pemetrexed, + Pembrolizumab q21d (started 7/7).   Noted pemetrexed held today due to anemia   Met with patient in infusion. He reports appetite is good. Patient says he eats all day long. He is eating a variety of foods and including good sources of protein. Patient had pancakes, 3 sausage links and glass of orange juice for breakfast. He is eating a ham sandwich at visit today. Patient reports pain is well managed. He denies nutrition impact symptoms.    Medications: MS Contin, MSIR, Klor-con, Senna-s  Labs: Na 132, albumin 3.4, Hgb 7.2  Anthropometrics: Wt 148 lb 3.2 oz today stable   9/29 - 149 lb 4.8 oz 9/8 - 149 lb 14.4 oz   INTERVENTION:  Continue strategies for increasing calories and protein with small frequent meals and snacks Discussed good sources of iron    MONITORING, EVALUATION, GOAL: weight trends, intake   NEXT VISIT: To be scheduled as needed with treatment

## 2021-10-21 NOTE — Patient Instructions (Signed)
[facility]  Discharge Instructions: Thank you for choosing [facility] to provide your oncology and hematology care.   If you have a lab appointment with the [facility], please go directly to the [facility] and check in at the registration area.   Wear comfortable clothing and clothing appropriate for easy access to any Portacath or PICC line.   We strive to give you quality time with your provider. You may need to reschedule your appointment if you arrive late (15 or more minutes).  Arriving late affects you and other patients whose appointments are after yours.  Also, if you miss three or more appointments without notifying the office, you may be dismissed from the clinic at the provider's discretion.      For prescription refill requests, have your pharmacy contact our office and allow 72 hours for refills to be completed.    Today you received the following chemotherapy and/or immunotherapy agents Keytruda      To help prevent nausea and vomiting after your treatment, we encourage you to take your nausea medication as directed.  BELOW ARE SYMPTOMS THAT SHOULD BE REPORTED IMMEDIATELY: *FEVER GREATER THAN 100.4 F (38 C) OR HIGHER *CHILLS OR SWEATING *NAUSEA AND VOMITING THAT IS NOT CONTROLLED WITH YOUR NAUSEA MEDICATION *UNUSUAL SHORTNESS OF BREATH *UNUSUAL BRUISING OR BLEEDING *URINARY PROBLEMS (pain or burning when urinating, or frequent urination) *BOWEL PROBLEMS (unusual diarrhea, constipation, pain near the anus) TENDERNESS IN MOUTH AND THROAT WITH OR WITHOUT PRESENCE OF ULCERS (sore throat, sores in mouth, or a toothache) UNUSUAL RASH, SWELLING OR PAIN  UNUSUAL VAGINAL DISCHARGE OR ITCHING   Items with * indicate a potential emergency and should be followed up as soon as possible or go to the Emergency Department if any problems should occur.  Please show the CHEMOTHERAPY ALERT CARD or IMMUNOTHERAPY ALERT CARD at check-in to  the Emergency Department and triage nurse.  Should you have questions after your visit or need to cancel or reschedule your appointment, please contact [facility]  Dept: [phone]  and follow the prompts.  Office hours are 8:00 a.m. to 4:30 p.m. Monday - Friday. Please note that voicemails left after 4:00 p.m. may not be returned until the following business day.  We are closed weekends and major holidays. You have access to a nurse at all times for urgent questions. Please call the main number to the clinic Dept: [phone] and follow the prompts.   For any non-urgent questions, you may also contact your provider using MyChart. We now offer e-Visits for anyone 51 and older to request care online for non-urgent symptoms. For details visit [email or web address].   Also download the MyChart app! Go to the app store, search "MyChart", open the app, select [facility], and log in with your MyChart username and password.  Masks are optional in the cancer centers. If you would like for your care team to wear a mask while they are taking care of you, please let them know. You may have one support person who is at least 68 years old accompany you for your appointments.

## 2021-10-22 LAB — T4: T4, Total: 7.8 ug/dL (ref 4.5–12.0)

## 2021-10-24 ENCOUNTER — Telehealth: Payer: Self-pay

## 2021-10-24 ENCOUNTER — Other Ambulatory Visit: Payer: Self-pay | Admitting: *Deleted

## 2021-10-24 ENCOUNTER — Other Ambulatory Visit: Payer: Self-pay | Admitting: Physician Assistant

## 2021-10-24 DIAGNOSIS — D649 Anemia, unspecified: Secondary | ICD-10-CM

## 2021-10-24 NOTE — Telephone Encounter (Signed)
Telephone call to patient to reschedule his Lab appointment from Wednesday 10/26/21 to Tuesday 10/25/21. He was agreeable with the change and aware of his new appointment time. Lab on 10/25/21 at 0900 for Type and Cross and then Blood transfusion on 10/26/2021 at 0900.

## 2021-10-25 ENCOUNTER — Inpatient Hospital Stay: Payer: Medicare HMO

## 2021-10-25 ENCOUNTER — Other Ambulatory Visit: Payer: Self-pay | Admitting: *Deleted

## 2021-10-25 DIAGNOSIS — Z5112 Encounter for antineoplastic immunotherapy: Secondary | ICD-10-CM | POA: Diagnosis not present

## 2021-10-25 DIAGNOSIS — D649 Anemia, unspecified: Secondary | ICD-10-CM

## 2021-10-25 DIAGNOSIS — Z95828 Presence of other vascular implants and grafts: Secondary | ICD-10-CM

## 2021-10-25 LAB — PREPARE RBC (CROSSMATCH)

## 2021-10-25 MED ORDER — SODIUM CHLORIDE 0.9% FLUSH
10.0000 mL | Freq: Once | INTRAVENOUS | Status: AC
  Administered 2021-10-25: 10 mL

## 2021-10-25 MED ORDER — HEPARIN SOD (PORK) LOCK FLUSH 100 UNIT/ML IV SOLN
500.0000 [IU] | Freq: Once | INTRAVENOUS | Status: AC
  Administered 2021-10-25: 500 [IU]

## 2021-10-25 NOTE — Progress Notes (Signed)
Confirmed w/blood bank that orders noted and ticket received. DASH called for stat pick up at [facility] blood bank for 0800 on 10/25 to deliver to [facility] by 0830.

## 2021-10-26 ENCOUNTER — Inpatient Hospital Stay: Payer: Medicare HMO

## 2021-10-26 DIAGNOSIS — D649 Anemia, unspecified: Secondary | ICD-10-CM

## 2021-10-26 DIAGNOSIS — Z5112 Encounter for antineoplastic immunotherapy: Secondary | ICD-10-CM | POA: Diagnosis not present

## 2021-10-26 MED ORDER — ACETAMINOPHEN 325 MG PO TABS
650.0000 mg | ORAL_TABLET | Freq: Once | ORAL | Status: AC
  Administered 2021-10-26: 650 mg via ORAL
  Filled 2021-10-26: qty 2

## 2021-10-26 MED ORDER — SODIUM CHLORIDE 0.9% IV SOLUTION
250.0000 mL | Freq: Once | INTRAVENOUS | Status: AC
  Administered 2021-10-26: 250 mL via INTRAVENOUS

## 2021-10-26 MED ORDER — HEPARIN SOD (PORK) LOCK FLUSH 100 UNIT/ML IV SOLN
500.0000 [IU] | Freq: Every day | INTRAVENOUS | Status: AC | PRN
  Administered 2021-10-26: 500 [IU]

## 2021-10-26 MED ORDER — SODIUM CHLORIDE 0.9% FLUSH
10.0000 mL | INTRAVENOUS | Status: AC | PRN
  Administered 2021-10-26: 10 mL

## 2021-10-26 NOTE — Patient Instructions (Signed)

## 2021-10-27 LAB — TYPE AND SCREEN
ABO/RH(D): B POS
Antibody Screen: NEGATIVE
Unit division: 0

## 2021-10-27 LAB — BPAM RBC
Blood Product Expiration Date: 202311122359
ISSUE DATE / TIME: 202310250726
Unit Type and Rh: 7300

## 2021-11-09 ENCOUNTER — Inpatient Hospital Stay: Payer: Medicare HMO | Attending: Hematology and Oncology | Admitting: Nurse Practitioner

## 2021-11-09 ENCOUNTER — Encounter: Payer: Self-pay | Admitting: Nurse Practitioner

## 2021-11-09 DIAGNOSIS — Z8546 Personal history of malignant neoplasm of prostate: Secondary | ICD-10-CM | POA: Insufficient documentation

## 2021-11-09 DIAGNOSIS — Z515 Encounter for palliative care: Secondary | ICD-10-CM | POA: Diagnosis not present

## 2021-11-09 DIAGNOSIS — I10 Essential (primary) hypertension: Secondary | ICD-10-CM | POA: Insufficient documentation

## 2021-11-09 DIAGNOSIS — D649 Anemia, unspecified: Secondary | ICD-10-CM | POA: Insufficient documentation

## 2021-11-09 DIAGNOSIS — K5903 Drug induced constipation: Secondary | ICD-10-CM | POA: Diagnosis not present

## 2021-11-09 DIAGNOSIS — Z923 Personal history of irradiation: Secondary | ICD-10-CM | POA: Diagnosis not present

## 2021-11-09 DIAGNOSIS — Z5112 Encounter for antineoplastic immunotherapy: Secondary | ICD-10-CM | POA: Diagnosis present

## 2021-11-09 DIAGNOSIS — G893 Neoplasm related pain (acute) (chronic): Secondary | ICD-10-CM | POA: Insufficient documentation

## 2021-11-09 DIAGNOSIS — R530 Neoplastic (malignant) related fatigue: Secondary | ICD-10-CM | POA: Diagnosis not present

## 2021-11-09 DIAGNOSIS — Z79891 Long term (current) use of opiate analgesic: Secondary | ICD-10-CM | POA: Insufficient documentation

## 2021-11-09 DIAGNOSIS — K5900 Constipation, unspecified: Secondary | ICD-10-CM | POA: Insufficient documentation

## 2021-11-09 DIAGNOSIS — C3412 Malignant neoplasm of upper lobe, left bronchus or lung: Secondary | ICD-10-CM | POA: Diagnosis present

## 2021-11-09 DIAGNOSIS — C3490 Malignant neoplasm of unspecified part of unspecified bronchus or lung: Secondary | ICD-10-CM

## 2021-11-09 DIAGNOSIS — Z87891 Personal history of nicotine dependence: Secondary | ICD-10-CM | POA: Diagnosis not present

## 2021-11-09 DIAGNOSIS — C7951 Secondary malignant neoplasm of bone: Secondary | ICD-10-CM | POA: Insufficient documentation

## 2021-11-09 DIAGNOSIS — Z5111 Encounter for antineoplastic chemotherapy: Secondary | ICD-10-CM | POA: Insufficient documentation

## 2021-11-09 DIAGNOSIS — Z809 Family history of malignant neoplasm, unspecified: Secondary | ICD-10-CM | POA: Insufficient documentation

## 2021-11-09 MED ORDER — MORPHINE SULFATE 15 MG PO TABS: 15.0000 mg | ORAL_TABLET | Freq: Four times a day (QID) | ORAL | 0 refills | Status: DC | PRN

## 2021-11-09 MED ORDER — MORPHINE SULFATE ER 15 MG PO TBCR: 15.0000 mg | EXTENDED_RELEASE_TABLET | Freq: Two times a day (BID) | ORAL | 0 refills | Status: DC

## 2021-11-09 NOTE — Progress Notes (Signed)
i    [place]  [phone] [phone]) [phone]   Name: Terry Page. Date: 11/09/2021 MRN: [id number]  DOB: 1955-01-28  Patient Care Team: [name], MD as PCP - General (Family Medicine)   I connected with Terry Page. on 11/09/21 at 11:30 AM EST by phone and verified that I am speaking with the correct person using two identifiers.   I discussed the limitations, risks, security and privacy concerns of performing an evaluation and management service by telemedicine and the availability of in-person appointments. I also discussed with the patient that there may be a patient responsible charge related to this service. The patient expressed understanding and agreed to proceed.   Other persons participating in the visit and their role in the encounter:    Patient's location: Home Provider's location: [facility]    INTERVAL HISTORY: Terry Page. is a 66 y.o. 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  Palliative ask to see for symptom management.  SOCIAL HISTORY:     reports that he quit smoking about 1 years ago. His smoking use included cigarettes. He has never used smokeless tobacco. He reports that he does not currently use alcohol after a past usage of about 12.0 standard drinks of alcohol per week. He reports that he does not use drugs.  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 Gery Pray   Incidental lung nodule, greater than or equal to 36mm 10/22/2017   Left upper lobe - discovered on CTA    Prostate cancer ([place])    Spontaneous pneumothorax 10/20/2017   right   Tobacco abuse     ALLERGIES:  has No Known Allergies.  MEDICATIONS:  Current Outpatient Medications  Medication Sig Dispense Refill   acetaminophen (TYLENOL) 325 MG tablet Take 2 tablets (650 mg total) by mouth every 6 (six) hours as needed for mild pain or headache (fever >/= 101).     albuterol (VENTOLIN HFA) 108 (90 Base) MCG/ACT inhaler Inhale 2 puffs into the lungs every 6 (six) hours as needed for wheezing or shortness of breath. 8 g 1   amLODipine (NORVASC) 10 MG tablet Take 1 tablet by mouth daily.     atorvastatin (LIPITOR) 20 MG tablet Take 20 mg by mouth daily.     docusate sodium (COLACE) 100 MG capsule Take 100 mg by mouth 2 (two) times daily.     folic acid (FOLVITE) 1 MG tablet Take 1 tablet (1 mg total) by mouth daily. 30 tablet 3   hydrOXYzine (ATARAX) 25 MG tablet Take 1 tablet (25 mg total) by mouth at bedtime as needed for itching. 30 tablet 11   ipratropium-albuterol (DUONEB) 0.5-2.5 (3) MG/3ML SOLN Take 3 mLs by nebulization every 6 (six) hours as needed. (Patient taking differently: Take 3 mLs by nebulization every 6 (six) hours as needed (shortness of breathing or wheezing).) 360 mL 0   lidocaine (LIDODERM) 5 % Place 1 patch onto the skin daily. Remove & Discard patch within 12 hours or as directed by MD. Apply to low  back 30 patch 0   lidocaine-prilocaine (EMLA) cream Apply 1 Application topically as needed. 30 g 0   losartan (COZAAR) 100 MG tablet Take 100 mg by mouth daily. (Patient not taking: Reported on 09/30/2021)     morphine (MS CONTIN) 15 MG 12 hr tablet Take 1 tablet (15 mg total) by mouth every 12 (twelve) hours. 60 tablet 0   morphine (MSIR) 15 MG tablet Take 1 tablet (15 mg total) by mouth every 6 (six) hours as needed for severe pain. 60 tablet 0   olmesartan (BENICAR) 40 MG tablet Take 40 mg by mouth daily.     ondansetron (ZOFRAN) 8 MG tablet Take 1 tablet (8 mg total) by mouth  every 8 (eight) hours as needed. 30 tablet 0   polyethylene glycol (MIRALAX) 17 g packet Take 17 g by mouth daily. 30 each 2   potassium chloride SA (KLOR-CON M) 20 MEQ tablet TAKE 1 TABLET(20 MEQ) BY MOUTH DAILY 90 tablet 0   prochlorperazine (COMPAZINE) 10 MG tablet Take 1 tablet (10 mg total) by mouth every 6 (six) hours as needed for nausea or vomiting. 30 tablet 0   senna-docusate (SENNA S) 8.6-50 MG tablet Take 2 tablets by mouth daily. 60 tablet 2   thiamine 100 MG tablet Take 1 tablet (100 mg total) by mouth daily.     No current facility-administered medications for this visit.    VITAL SIGNS: There were no vitals taken for this visit. There were no vitals filed for this visit.  Estimated body mass index is 22.53 kg/m as calculated from the following:   Height as of 09/01/21: 5\' 8"  (1.727 m).   Weight as of 10/21/21: 148 lb 3.2 oz (67.2 kg).   PERFORMANCE STATUS (ECOG) : 1 - Symptomatic but completely ambulatory   IMPRESSION: I connected with Terry Page by phone for symptom management follow-up. Denies any acute distress. Denies nausea, vomiting, or diarrhea.  Continues to feel well.  Appetite is good.  Neoplasm related pain  Pain is well controlled on current regimen. Does not require daily use of MS IR. He is only taking if pain is severe which generally occurs with increased activity.  Encouraged him to listen to his body and not overdo it. Tolerating MS Contin as prescribed.   We will continue to closely monitor.   Constipation  Constipation is improved. He is taking Miralax and Senna as recommended.   I discussed the importance of continued conversation with family and their medical providers regarding overall plan of care and treatment options, ensuring decisions are within the context of the patients values and GOCs.  PLAN: Continue with MS Contin 15mg  daily. MS IR 15 mg as needed.  Not taking regularly  Miralax/Senna daily  I will plan to see patient back in 4-6  weeks in collaboration with his other oncology appointments. Will give him a call next week for close symptom follow-up.    Patient expressed understanding and was in agreement with this plan. He also understands that He can call the clinic at any time with any questions, concerns, or complaints.    Any controlled substances utilized were prescribed in the context of palliative care. PDMP has been reviewed.     Time Total: 20 min   Visit consisted of counseling and education dealing with the complex and emotionally intense issues of symptom management and palliative care in the setting of serious and potentially life-threatening illness.Greater than 50%  of this time was spent counseling and coordinating care  related to the above assessment and plan.  Alda Lea, AGPCNP-BC  Palliative Medicine Team/[facility]

## 2021-11-11 ENCOUNTER — Ambulatory Visit: Payer: Medicare HMO

## 2021-11-11 ENCOUNTER — Ambulatory Visit: Payer: Medicare HMO | Admitting: Hematology and Oncology

## 2021-11-11 ENCOUNTER — Inpatient Hospital Stay: Payer: Medicare HMO

## 2021-11-11 NOTE — Progress Notes (Unsigned)
[place] [phone]   [phone]) [phone]  PROGRESS NOTE  Patient Care Team: [name], MD as PCP - General (Family Medicine)  Hematological/Oncological History # Metastatic Adenocarcinoma of the Lung  01/07/2020 : CT of the chest with contrast performed which showed interval enlargement of a spiculated nodule in the central left upper lobe measuring 1.2 x 1.0 cm and highly suspicious for primary lung malignancy 02/06/2020 :PET scan performed which showed a hypermetabolic irregular solid 1.3 cm left upper lobe pulmonary nodule compatible with malignancy without any other hypermetabolic lesions 0/07/3708- 04/19/2020:  SBRT to the lung lesion 04/28/2021: CT C/A/P showed multifocal lytic bone metastases are identified. Lesion within the spine of the left scapula and lesions involving bilateral iliac bones and left sacral wing. Establish care with Dr. Lorenso Courier  05/03/2021: biopsy of right iliac lytic lesion showed metastatic carcinoma, consistent with lung primary.  07/08/2021: Cycle 1 Day 1 of Carbo/Pem/Pem 07/29/2021: Cycle 2 Day 1 of Carbo/Pem/Pem 08/11/2021-08/24/2021: Received palliative radiation to osseous metastasis in the left shoulder. 30 Gy in 10 fractions.  08/19/2021: Cycle 3 Day 1 of Carbo/Pem/Pem 09/09/2021: Cycle 4 Day 1 of Carbo/Pem/Pem 09/23/2021: CT CAP: stable disease 09/30/2021: Cycle 5 Day 1 of Pem/Pem 10/21/2021: Cycle 6 Day 1 of Pembrolizumab. Held pemetrexed due to anemia.  11/11/2021: Cycle 7 Day 1 of Pem/Pem  #Adenocarcinoma of the Prostate, [id number] 08/19/2019-10/07/2019: 70 Gy in 28 fractions of 2.5 Gy.  Radiation to the prostate was under the care of Dr. Tyler Pita  Interval History:  Terry Page. 66 y.o. male with medical history significant for metastatic adenocarcinoma of the lung who presents for a follow up visit. The patient's last visit was on 10/21/2021. In the interim since the last visit he has completed Cycle 6 of  chemotherapy.  On exam today Terry Page reports ***  He denies any fevers, chills, night sweats, shortness of breath, chest pain, cough, peripheral edema or neuropathy.  He has no other complaints.A full 10 point ROS is listed below.  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 Gery Pray   Incidental lung nodule, greater than or equal to 44mm 10/22/2017   Left upper lobe - discovered on CTA   Prostate cancer ([place])    Spontaneous pneumothorax 10/20/2017   right   Tobacco abuse     SURGICAL HISTORY: Past Surgical History:  Procedure Laterality Date   BACK SURGERY     IR IMAGING GUIDED PORT INSERTION  06/29/2021   PROSTATE BIOPSY      SOCIAL HISTORY: Social History   Socioeconomic History   Marital status: Single    Spouse name: Not on file   Number of children: Not on file   Years of education: Not on file   Highest education level: Not on file  Occupational History   Occupation: retired  Tobacco Use   Smoking status: Former    Types: Cigarettes    Quit date: 11/22/2019    Years since quitting: 1.9   Smokeless tobacco: Never   Tobacco comments:    Patient reports quit 3 years ago. 06/25/20. [facility]  Vaping Use   Vaping Use: Never used  Substance and Sexual Activity   Alcohol use: Not Currently    Alcohol/week: 12.0 standard drinks of alcohol  Types: 12 Cans of beer per week    Comment: daily   Drug use: No   Sexual activity: Not Currently  Other Topics Concern   Not on file  Social History Narrative   Not on file   Social Determinants of Health   Financial Resource Strain: Not on file  Food Insecurity: Not on file  Transportation Needs: Unmet Transportation Needs (07/18/2021)   PRAPARE - Hydrologist (Medical): Yes    Lack of Transportation  (Non-Medical): Yes  Physical Activity: Not on file  Stress: Not on file  Social Connections: Not on file  Intimate Partner Violence: Not on file    FAMILY HISTORY: Family History  Problem Relation Age of Onset   Cancer Cousin        maternal cousin   Cancer Cousin        paternal cousin   Cancer Cousin    Colon polyps Neg Hx    Pancreatic disease Neg Hx    Pancreatic cancer Neg Hx    Breast cancer Neg Hx    Colon cancer Neg Hx     ALLERGIES:  has No Known Allergies.  MEDICATIONS:  Current Outpatient Medications  Medication Sig Dispense Refill   acetaminophen (TYLENOL) 325 MG tablet Take 2 tablets (650 mg total) by mouth every 6 (six) hours as needed for mild pain or headache (fever >/= 101).     albuterol (VENTOLIN HFA) 108 (90 Base) MCG/ACT inhaler Inhale 2 puffs into the lungs every 6 (six) hours as needed for wheezing or shortness of breath. 8 g 1   amLODipine (NORVASC) 10 MG tablet Take 1 tablet by mouth daily.     atorvastatin (LIPITOR) 20 MG tablet Take 20 mg by mouth daily.     docusate sodium (COLACE) 100 MG capsule Take 100 mg by mouth 2 (two) times daily.     folic acid (FOLVITE) 1 MG tablet Take 1 tablet (1 mg total) by mouth daily. 30 tablet 3   hydrOXYzine (ATARAX) 25 MG tablet Take 1 tablet (25 mg total) by mouth at bedtime as needed for itching. 30 tablet 11   ipratropium-albuterol (DUONEB) 0.5-2.5 (3) MG/3ML SOLN Take 3 mLs by nebulization every 6 (six) hours as needed. (Patient taking differently: Take 3 mLs by nebulization every 6 (six) hours as needed (shortness of breathing or wheezing).) 360 mL 0   lidocaine (LIDODERM) 5 % Place 1 patch onto the skin daily. Remove & Discard patch within 12 hours or as directed by MD. Apply to low back 30 patch 0   lidocaine-prilocaine (EMLA) cream Apply 1 Application topically as needed. 30 g 0   losartan (COZAAR) 100 MG tablet Take 100 mg by mouth daily. (Patient not taking: Reported on 09/30/2021)     morphine (MS CONTIN)  15 MG 12 hr tablet Take 1 tablet (15 mg total) by mouth every 12 (twelve) hours. 60 tablet 0   morphine (MSIR) 15 MG tablet Take 1 tablet (15 mg total) by mouth every 6 (six) hours as needed for severe pain. 60 tablet 0   olmesartan (BENICAR) 40 MG tablet Take 40 mg by mouth daily.     ondansetron (ZOFRAN) 8 MG tablet Take 1 tablet (8 mg total) by mouth every 8 (eight) hours as needed. 30 tablet 0   polyethylene glycol (MIRALAX) 17 g packet Take 17 g by mouth daily. 30 each 2   potassium chloride SA (KLOR-CON M) 20 MEQ tablet TAKE 1 TABLET(20 MEQ) BY  MOUTH DAILY 90 tablet 0   prochlorperazine (COMPAZINE) 10 MG tablet Take 1 tablet (10 mg total) by mouth every 6 (six) hours as needed for nausea or vomiting. 30 tablet 0   senna-docusate (SENNA S) 8.6-50 MG tablet Take 2 tablets by mouth daily. 60 tablet 2   thiamine 100 MG tablet Take 1 tablet (100 mg total) by mouth daily.     No current facility-administered medications for this visit.    REVIEW OF SYSTEMS:   Constitutional: ( - ) fevers, ( - )  chills , ( - ) night sweats Eyes: ( - ) blurriness of vision, ( - ) double vision, ( - ) watery eyes Ears, nose, mouth, throat, and face: ( - ) mucositis, ( - ) sore throat Respiratory: ( - ) cough, ( - ) dyspnea, ( - ) wheezes Cardiovascular: ( - ) palpitation, ( - ) chest discomfort, ( - ) lower extremity swelling Gastrointestinal:  ( - ) nausea, ( - ) heartburn, ( - ) change in bowel habits Skin: ( - ) abnormal skin rashes Lymphatics: ( - ) new lymphadenopathy, ( - ) easy bruising Neurological: ( - ) numbness, ( - ) tingling, ( - ) new weaknesses Behavioral/Psych: ( - ) mood change, ( - ) new changes  All other systems were reviewed with the patient and are negative.  PHYSICAL EXAMINATION: ECOG PERFORMANCE STATUS: 1 - Symptomatic but completely ambulatory  There were no vitals filed for this visit.  There were no vitals filed for this visit.   GENERAL: Well-appearing elderly  African-American male, alert, no distress and comfortable SKIN: skin color, texture, turgor are normal, no rashes or significant lesions EYES: conjunctiva are pink and non-injected, sclera clear LUNGS: clear to auscultation and percussion with normal breathing effort HEART: regular rate & rhythm and no murmurs and no lower extremity edema Musculoskeletal: no cyanosis of digits and no clubbing  PSYCH: alert & oriented x 3, fluent speech NEURO: no focal motor/sensory deficits  LABORATORY DATA:  I have reviewed the data as listed    Latest Ref Rng & Units 10/21/2021    7:56 AM 09/30/2021    7:57 AM 09/09/2021    8:40 AM  CBC  WBC 4.0 - 10.5 K/uL 8.6  7.7  7.5   Hemoglobin 13.0 - 17.0 g/dL 7.2  8.2  8.6   Hematocrit 39.0 - 52.0 % 22.1  24.1  24.7   Platelets 150 - 400 K/uL 463  293  308        Latest Ref Rng & Units 10/21/2021    7:56 AM 09/30/2021    7:57 AM 09/09/2021    8:40 AM  CMP  Glucose 70 - 99 mg/dL 91  114  108   BUN 8 - 23 mg/dL 17  11  12    Creatinine 0.61 - 1.24 mg/dL 1.06  0.94  0.93   Sodium 135 - 145 mmol/L 132  130  131   Potassium 3.5 - 5.1 mmol/L 4.7  4.8  4.3   Chloride 98 - 111 mmol/L 106  101  101   CO2 22 - 32 mmol/L 21  22  23    Calcium 8.9 - 10.3 mg/dL 9.3  9.2  9.8   Total Protein 6.5 - 8.1 g/dL 7.6  8.3  8.8   Total Bilirubin 0.3 - 1.2 mg/dL 0.2  0.3  0.2   Alkaline Phos 38 - 126 U/L 60  82  84   AST 15 - 41 U/L  16  15  15    ALT 0 - 44 U/L 10  11  11      Lab Results  Component Value Date   MPROTEIN Not Observed 04/28/2021   Lab Results  Component Value Date   KPAFRELGTCHN 33.6 (H) 04/28/2021   LAMBDASER 22.0 04/28/2021   KAPLAMBRATIO 1.53 04/28/2021    RADIOGRAPHIC STUDIES: No results found.  ASSESSMENT & PLAN Terry Page. 66 y.o. male with medical history significant for newly diagnosed metastatic adenocarcinoma of the lung who presents for a follow up visit.  # Metastatic Adenocarcinoma of the Lung  -- NGS testing from this  patient shows no evidence of targetable mutation. --MRI brain shows no evidence of intracranial metastases --Started carboplatin, pembrolizumab, and pemetrexed as treatment for his cancer on 07/08/2021. --Received palliative radiation to osseous metastasis of the left shoulder from 08/11/2021-08/24/2021. Planned dose is 30 Gy in 10 fx.  Plan: --today is Day 1 Cycle 7 of Pem/Pem for metastatic adenocarcinoma of the lung --labs from today were reviewed. Labs show white blood cell count *** --HOLDING pemetrexed due to anemia, will plan to restart next cycle if levels improve. Proceed with pembrolizumab today.  --CT C/A/P next due in Dec 2023. Last scan in Sept 2023 showed response to therapy.  --RTC in 3 weeks time port labs, f/u visit and Cycle 8, Day 1 of chemotherapy  #Normocytic anemia-worsening --Likely secondary to chemotherapy --Hgb *** today, stable from last visit at 7.2. Will HOLD pemextrexed today --return to clinic next week for labs and possible transfusion.  --Patient is not complaining of fatigue or worsening SOB. Monitor closely --continue to monitor closely  #Pain Control--improving -- Currently taking MS Contin 15 mg q 12 hours and tylenol for breakthrough pain. Only taking his short acting once daily.   --Palliative care currently following --Continue senna docusate for constipation prophylaxis --Continue to monitor  #Supportive Care -- chemotherapy education complete -- port placed -- zofran 8mg  q8H PRN and compazine 10mg  PO q6H for nausea -- EMLA cream for port -- Patient referred to palliative care for pain control. Pain control as above.    No orders of the defined types were placed in this encounter.   All questions were answered. The patient knows to call the clinic with any problems, questions or concerns.  I have spent a total of 30 minutes minutes of face-to-face and non-face-to-face time, preparing to see the patient, performing a medically appropriate  examination, counseling and educating the patient, referring and communicating with other health care professionals, documenting clinical information in the electronic health record, and care coordination.   Ledell Peoples, MD Department of Hematology/Oncology Center at [facility] Phone: [phone] Pager: [phone] Email: [email or web address]  11/11/2021 8:09 AM

## 2021-11-15 ENCOUNTER — Encounter: Payer: Self-pay | Admitting: Hematology and Oncology

## 2021-11-16 ENCOUNTER — Inpatient Hospital Stay: Payer: Medicare HMO

## 2021-11-16 ENCOUNTER — Inpatient Hospital Stay: Payer: Medicare HMO | Admitting: Physician Assistant

## 2021-11-16 ENCOUNTER — Other Ambulatory Visit: Payer: Self-pay

## 2021-11-16 VITALS — BP 111/73 | HR 86 | Temp 97.4°F | Resp 18 | Ht 68.0 in | Wt 149.7 lb

## 2021-11-16 DIAGNOSIS — C3412 Malignant neoplasm of upper lobe, left bronchus or lung: Secondary | ICD-10-CM

## 2021-11-16 DIAGNOSIS — D649 Anemia, unspecified: Secondary | ICD-10-CM

## 2021-11-16 DIAGNOSIS — Z5112 Encounter for antineoplastic immunotherapy: Secondary | ICD-10-CM | POA: Diagnosis not present

## 2021-11-16 DIAGNOSIS — Z95828 Presence of other vascular implants and grafts: Secondary | ICD-10-CM

## 2021-11-16 DIAGNOSIS — G893 Neoplasm related pain (acute) (chronic): Secondary | ICD-10-CM

## 2021-11-16 LAB — CMP (CANCER CENTER ONLY)
ALT: 7 U/L (ref 0–44)
AST: 16 U/L (ref 15–41)
Albumin: 3.6 g/dL (ref 3.5–5.0)
Alkaline Phosphatase: 73 U/L (ref 38–126)
Anion gap: 7 (ref 5–15)
BUN: 16 mg/dL (ref 8–23)
CO2: 19 mmol/L — ABNORMAL LOW (ref 22–32)
Calcium: 9.1 mg/dL (ref 8.9–10.3)
Chloride: 104 mmol/L (ref 98–111)
Creatinine: 1.15 mg/dL (ref 0.61–1.24)
GFR, Estimated: >60 mL/min (ref >60)
Glucose, Bld: 96 mg/dL (ref 70–99)
Potassium: 4.6 mmol/L (ref 3.5–5.1)
Sodium: 130 mmol/L — ABNORMAL LOW (ref 135–145)
Total Bilirubin: 0.6 mg/dL (ref 0.3–1.2)
Total Protein: 8.3 g/dL — ABNORMAL HIGH (ref 6.5–8.1)

## 2021-11-16 LAB — IRON AND IRON BINDING CAPACITY (CC-WL,HP ONLY)
Iron: 65 ug/dL (ref 45–182)
Saturation Ratios: 20 % (ref 17.9–39.5)
TIBC: 325 ug/dL (ref 250–450)
UIBC: 260 ug/dL (ref 117–376)

## 2021-11-16 LAB — CBC WITH DIFFERENTIAL (CANCER CENTER ONLY)
Abs Immature Granulocytes: 0.08 10*3/uL — ABNORMAL HIGH (ref 0.00–0.07)
Basophils Absolute: 0.1 10*3/uL (ref 0.0–0.1)
Basophils Relative: 1 %
Eosinophils Absolute: 0.4 10*3/uL (ref 0.0–0.5)
Eosinophils Relative: 3 %
HCT: 28.9 % — ABNORMAL LOW (ref 39.0–52.0)
Hemoglobin: 9.5 g/dL — ABNORMAL LOW (ref 13.0–17.0)
Immature Granulocytes: 1 %
Lymphocytes Relative: 10 %
Lymphs Abs: 1.2 10*3/uL (ref 0.7–4.0)
MCH: 32.2 pg (ref 26.0–34.0)
MCHC: 32.9 g/dL (ref 30.0–36.0)
MCV: 98 fL (ref 80.0–100.0)
Monocytes Absolute: 1.3 10*3/uL — ABNORMAL HIGH (ref 0.1–1.0)
Monocytes Relative: 11 %
Neutro Abs: 8.9 10*3/uL — ABNORMAL HIGH (ref 1.7–7.7)
Neutrophils Relative %: 74 %
Platelet Count: 295 10*3/uL (ref 150–400)
RBC: 2.95 MIL/uL — ABNORMAL LOW (ref 4.22–5.81)
RDW: 16.7 % — ABNORMAL HIGH (ref 11.5–15.5)
WBC Count: 12 10*3/uL — ABNORMAL HIGH (ref 4.0–10.5)
nRBC: 0 % (ref 0.0–0.2)

## 2021-11-16 LAB — TSH: TSH: 1.332 u[IU]/mL (ref 0.350–4.500)

## 2021-11-16 LAB — SAMPLE TO BLOOD BANK

## 2021-11-16 LAB — FOLATE: Folate: >40 ng/mL (ref >5.9)

## 2021-11-16 LAB — VITAMIN B12: Vitamin B-12: 189 pg/mL (ref 180–914)

## 2021-11-16 LAB — FERRITIN: Ferritin: 309 ng/mL (ref 24–336)

## 2021-11-16 MED ORDER — HEPARIN SOD (PORK) LOCK FLUSH 100 UNIT/ML IV SOLN
500.0000 [IU] | Freq: Once | INTRAVENOUS | Status: AC | PRN
  Administered 2021-11-16: 500 [IU]

## 2021-11-16 MED ORDER — SODIUM CHLORIDE 0.9 % IV SOLN: Freq: Once | INTRAVENOUS | Status: AC

## 2021-11-16 MED ORDER — SODIUM CHLORIDE 0.9% FLUSH
10.0000 mL | INTRAVENOUS | Status: DC | PRN
  Administered 2021-11-16: 10 mL

## 2021-11-16 MED ORDER — SODIUM CHLORIDE 0.9% FLUSH
10.0000 mL | Freq: Once | INTRAVENOUS | Status: AC
  Administered 2021-11-16: 10 mL

## 2021-11-16 MED ORDER — CYANOCOBALAMIN 1000 MCG/ML IJ SOLN
1000.0000 ug | Freq: Once | INTRAMUSCULAR | Status: AC
  Administered 2021-11-16: 1000 ug via INTRAMUSCULAR
  Filled 2021-11-16: qty 1

## 2021-11-16 MED ORDER — SODIUM CHLORIDE 0.9 % IV SOLN
500.0000 mg/m2 | Freq: Once | INTRAVENOUS | Status: AC
  Administered 2021-11-16: 900 mg via INTRAVENOUS
  Filled 2021-11-16: qty 20

## 2021-11-16 MED ORDER — PROCHLORPERAZINE MALEATE 10 MG PO TABS
10.0000 mg | ORAL_TABLET | Freq: Once | ORAL | Status: AC
  Administered 2021-11-16: 10 mg via ORAL
  Filled 2021-11-16: qty 1

## 2021-11-16 MED ORDER — SODIUM CHLORIDE 0.9 % IV SOLN
200.0000 mg | Freq: Once | INTRAVENOUS | Status: AC
  Administered 2021-11-16: 200 mg via INTRAVENOUS
  Filled 2021-11-16: qty 200

## 2021-11-16 NOTE — Patient Instructions (Signed)
Normal ONCOLOGY  Discharge Instructions: Thank you for choosing [facility] to provide your oncology and hematology care.   If you have a lab appointment with the [facility], please go directly to the [facility] and check in at the registration area.   Wear comfortable clothing and clothing appropriate for easy access to any Portacath or PICC line.   We strive to give you quality time with your provider. You may need to reschedule your appointment if you arrive late (15 or more minutes).  Arriving late affects you and other patients whose appointments are after yours.  Also, if you miss three or more appointments without notifying the office, you may be dismissed from the clinic at the provider's discretion.      For prescription refill requests, have your pharmacy contact our office and allow 72 hours for refills to be completed.    Today you received the following chemotherapy and/or immunotherapy agents: Beryle Flock, alimta      To help prevent nausea and vomiting after your treatment, we encourage you to take your nausea medication as directed.  BELOW ARE SYMPTOMS THAT SHOULD BE REPORTED IMMEDIATELY: *FEVER GREATER THAN 100.4 F (38 C) OR HIGHER *CHILLS OR SWEATING *NAUSEA AND VOMITING THAT IS NOT CONTROLLED WITH YOUR NAUSEA MEDICATION *UNUSUAL SHORTNESS OF BREATH *UNUSUAL BRUISING OR BLEEDING *URINARY PROBLEMS (pain or burning when urinating, or frequent urination) *BOWEL PROBLEMS (unusual diarrhea, constipation, pain near the anus) TENDERNESS IN MOUTH AND THROAT WITH OR WITHOUT PRESENCE OF ULCERS (sore throat, sores in mouth, or a toothache) UNUSUAL RASH, SWELLING OR PAIN  UNUSUAL VAGINAL DISCHARGE OR ITCHING   Items with * indicate a potential emergency and should be followed up as soon as possible or go to the Emergency Department if any problems should occur.  Please show the CHEMOTHERAPY ALERT CARD or IMMUNOTHERAPY ALERT CARD at  check-in to the Emergency Department and triage nurse.  Should you have questions after your visit or need to cancel or reschedule your appointment, please contact [facility]  Dept: [phone]  and follow the prompts.  Office hours are 8:00 a.m. to 4:30 p.m. Monday - Friday. Please note that voicemails left after 4:00 p.m. may not be returned until the following business day.  We are closed weekends and major holidays. You have access to a nurse at all times for urgent questions. Please call the main number to the clinic Dept: [phone] and follow the prompts.   For any non-urgent questions, you may also contact your provider using MyChart. We now offer e-Visits for anyone 17 and older to request care online for non-urgent symptoms. For details visit [email or web address].   Also download the MyChart app! Go to the app store, search "MyChart", open the app, select [facility], and log in with your MyChart username and password.  Masks are optional in the cancer centers. If you would like for your care team to wear a mask while they are taking care of you, please let them know. You may have one support Shaivi Rothschild who is at least 68 years old accompany you for your appointments.

## 2021-11-16 NOTE — Progress Notes (Signed)
[facility] [phone]   [phone]  PROGRESS NOTE  Patient Care Team: [name], MD as PCP - General (Family Medicine)  Hematological/Oncological History # Metastatic Adenocarcinoma of the Lung  01/07/2020 : CT of the chest with contrast performed which showed interval enlargement of a spiculated nodule in the central left upper lobe measuring 1.2 x 1.0 cm and highly suspicious for primary lung malignancy 02/06/2020 :PET scan performed which showed a hypermetabolic irregular solid 1.3 cm left upper lobe pulmonary nodule compatible with malignancy without any other hypermetabolic lesions 03/08/6281- 04/19/2020:  SBRT to the lung lesion 04/28/2021: CT C/A/P showed multifocal lytic bone metastases are identified. Lesion within the spine of the left scapula and lesions involving bilateral iliac bones and left sacral wing. Establish care with Dr. Lorenso Courier  05/03/2021: biopsy of right iliac lytic lesion showed metastatic carcinoma, consistent with lung primary.  07/08/2021: Cycle 1 Day 1 of Carbo/Pem/Pem 07/29/2021: Cycle 2 Day 1 of Carbo/Pem/Pem 08/11/2021-08/24/2021: Received palliative radiation to osseous metastasis in the left shoulder. 30 Gy in 10 fractions.  08/19/2021: Cycle 3 Day 1 of Carbo/Pem/Pem 09/09/2021: Cycle 4 Day 1 of Carbo/Pem/Pem 09/23/2021: CT CAP: stable disease 09/30/2021: Cycle 5 Day 1 of Pem/Pem 10/21/2021: Cycle 6 Day 1 of Pembrolizumab. Held pemetrexed due to anemia. 11/16/2021: Cycle 7 Day 1 of Pem/Pem.    #Adenocarcinoma of the Prostate, [id number] 08/19/2019-10/07/2019: 70 Gy in 28 fractions of 2.5 Gy.  Radiation to the prostate was under the care of Dr. Tyler Pita  Interval History:  Terry Page. 66 y.o. male with medical history significant for metastatic adenocarcinoma of the lung who presents for a follow up visit. The patient's last visit was on 10/21/2021. In the interim since the last visit he has completed Cycle 6 of  chemotherapy.  On exam today Mr. Belding reports since receiving blood transfusion, his energy levels have improved.  He continues to have worsening left shoulder pain that requires him to take his short acting oxycodone more frequently. He reports his appetite is stable and weight is unchanged. He denies nausea, vomiting or abdominal pain. His bowel habits are unchanged without any recurrent episodes of diarrhea or constipation.   He denies any fevers, chills, night sweats, shortness of breath, chest pain, cough, peripheral edema or neuropathy.  He has no other complaints.A full 10 point ROS is listed below.  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 Gery Pray   Incidental lung nodule, greater than or equal to 29mm 10/22/2017   Left upper lobe - discovered on CTA   Prostate cancer ([place])    Spontaneous pneumothorax 10/20/2017   right   Tobacco abuse     SURGICAL HISTORY: Past Surgical History:  Procedure Laterality Date   BACK SURGERY     IR IMAGING GUIDED PORT INSERTION  06/29/2021   PROSTATE BIOPSY      SOCIAL HISTORY: Social History   Socioeconomic History   Marital status: Single    Spouse name: Not on file   Number of children: Not on file   Years of education: Not on file   Highest education level: Not on file  Occupational History   Occupation: retired  Tobacco Use   Smoking status: Former    Types: Cigarettes  Quit date: 11/22/2019    Years since quitting: 1.9   Smokeless tobacco: Never   Tobacco comments:    Patient reports quit 3 years ago. 06/25/20. [facility]  Vaping Use   Vaping Use: Never used  Substance and Sexual Activity   Alcohol use: Not Currently    Alcohol/week: 12.0 standard drinks of alcohol    Types: 12 Cans of beer per week    Comment: daily   Drug use: No    Sexual activity: Not Currently  Other Topics Concern   Not on file  Social History Narrative   Not on file   Social Determinants of Health   Financial Resource Strain: Not on file  Food Insecurity: Not on file  Transportation Needs: Unmet Transportation Needs (07/18/2021)   PRAPARE - Hydrologist (Medical): Yes    Lack of Transportation (Non-Medical): Yes  Physical Activity: Not on file  Stress: Not on file  Social Connections: Not on file  Intimate Partner Violence: Not on file    FAMILY HISTORY: Family History  Problem Relation Age of Onset   Cancer Cousin        maternal cousin   Cancer Cousin        paternal cousin   Cancer Cousin    Colon polyps Neg Hx    Pancreatic disease Neg Hx    Pancreatic cancer Neg Hx    Breast cancer Neg Hx    Colon cancer Neg Hx     ALLERGIES:  has No Known Allergies.  MEDICATIONS:  Current Outpatient Medications  Medication Sig Dispense Refill   acetaminophen (TYLENOL) 325 MG tablet Take 2 tablets (650 mg total) by mouth every 6 (six) hours as needed for mild pain or headache (fever >/= 101).     albuterol (VENTOLIN HFA) 108 (90 Base) MCG/ACT inhaler Inhale 2 puffs into the lungs every 6 (six) hours as needed for wheezing or shortness of breath. 8 g 1   amLODipine (NORVASC) 10 MG tablet Take 1 tablet by mouth daily.     atorvastatin (LIPITOR) 20 MG tablet Take 20 mg by mouth daily.     docusate sodium (COLACE) 100 MG capsule Take 100 mg by mouth 2 (two) times daily.     folic acid (FOLVITE) 1 MG tablet Take 1 tablet (1 mg total) by mouth daily. 30 tablet 3   hydrOXYzine (ATARAX) 25 MG tablet Take 1 tablet (25 mg total) by mouth at bedtime as needed for itching. 30 tablet 11   ipratropium-albuterol (DUONEB) 0.5-2.5 (3) MG/3ML SOLN Take 3 mLs by nebulization every 6 (six) hours as needed. (Patient taking differently: Take 3 mLs by nebulization every 6 (six) hours as needed (shortness of breathing or wheezing).)  360 mL 0   lidocaine (LIDODERM) 5 % Place 1 patch onto the skin daily. Remove & Discard patch within 12 hours or as directed by MD. Apply to low back 30 patch 0   lidocaine-prilocaine (EMLA) cream Apply 1 Application topically as needed. 30 g 0   losartan (COZAAR) 100 MG tablet Take 100 mg by mouth daily.     morphine (MS CONTIN) 15 MG 12 hr tablet Take 1 tablet (15 mg total) by mouth every 12 (twelve) hours. 60 tablet 0   morphine (MSIR) 15 MG tablet Take 1 tablet (15 mg total) by mouth every 6 (six) hours as needed for severe pain. 60 tablet 0   olmesartan (BENICAR) 40 MG tablet Take 40 mg by mouth daily.  ondansetron (ZOFRAN) 8 MG tablet Take 1 tablet (8 mg total) by mouth every 8 (eight) hours as needed. 30 tablet 0   polyethylene glycol (MIRALAX) 17 g packet Take 17 g by mouth daily. 30 each 2   potassium chloride SA (KLOR-CON M) 20 MEQ tablet TAKE 1 TABLET(20 MEQ) BY MOUTH DAILY 90 tablet 0   prochlorperazine (COMPAZINE) 10 MG tablet Take 1 tablet (10 mg total) by mouth every 6 (six) hours as needed for nausea or vomiting. 30 tablet 0   senna-docusate (SENNA S) 8.6-50 MG tablet Take 2 tablets by mouth daily. 60 tablet 2   thiamine 100 MG tablet Take 1 tablet (100 mg total) by mouth daily.     No current facility-administered medications for this visit.    REVIEW OF SYSTEMS:   Constitutional: ( - ) fevers, ( - )  chills , ( - ) night sweats Eyes: ( - ) blurriness of vision, ( - ) double vision, ( - ) watery eyes Ears, nose, mouth, throat, and face: ( - ) mucositis, ( - ) sore throat Respiratory: ( - ) cough, ( - ) dyspnea, ( - ) wheezes Cardiovascular: ( - ) palpitation, ( - ) chest discomfort, ( - ) lower extremity swelling Gastrointestinal:  ( - ) nausea, ( - ) heartburn, ( - ) change in bowel habits Skin: ( - ) abnormal skin rashes Lymphatics: ( - ) new lymphadenopathy, ( - ) easy bruising Neurological: ( - ) numbness, ( - ) tingling, ( - ) new weaknesses Behavioral/Psych: ( - )  mood change, ( - ) new changes  All other systems were reviewed with the patient and are negative.  PHYSICAL EXAMINATION: ECOG PERFORMANCE STATUS: 1 - Symptomatic but completely ambulatory  Vitals:   11/16/21 0819  BP: 111/73  Pulse: 86  Resp: 18  Temp: (!) 97.4 F (36.3 C)  SpO2: 96%   Filed Weights   11/16/21 0819  Weight: 149 lb 11.2 oz (67.9 kg)    GENERAL: Well-appearing elderly African-American male, alert, no distress and comfortable SKIN: skin color, texture, turgor are normal, no rashes or significant lesions EYES: conjunctiva are pink and non-injected, sclera clear LUNGS: clear to auscultation and percussion with normal breathing effort HEART: regular rate & rhythm and no murmurs and no lower extremity edema Musculoskeletal: no cyanosis of digits and no clubbing  PSYCH: alert & oriented x 3, fluent speech NEURO: no focal motor/sensory deficits  LABORATORY DATA:  I have reviewed the data as listed    Latest Ref Rng & Units 11/16/2021    8:07 AM 10/21/2021    7:56 AM 09/30/2021    7:57 AM  CBC  WBC 4.0 - 10.5 K/uL 12.0  8.6  7.7   Hemoglobin 13.0 - 17.0 g/dL 9.5  7.2  8.2   Hematocrit 39.0 - 52.0 % 28.9  22.1  24.1   Platelets 150 - 400 K/uL 295  463  293        Latest Ref Rng & Units 10/21/2021    7:56 AM 09/30/2021    7:57 AM 09/09/2021    8:40 AM  CMP  Glucose 70 - 99 mg/dL 91  114  108   BUN 8 - 23 mg/dL 17  11  12    Creatinine 0.61 - 1.24 mg/dL 1.06  0.94  0.93   Sodium 135 - 145 mmol/L 132  130  131   Potassium 3.5 - 5.1 mmol/L 4.7  4.8  4.3   Chloride 98 -  111 mmol/L 106  101  101   CO2 22 - 32 mmol/L 21  22  23    Calcium 8.9 - 10.3 mg/dL 9.3  9.2  9.8   Total Protein 6.5 - 8.1 g/dL 7.6  8.3  8.8   Total Bilirubin 0.3 - 1.2 mg/dL 0.2  0.3  0.2   Alkaline Phos 38 - 126 U/L 60  82  84   AST 15 - 41 U/L 16  15  15    ALT 0 - 44 U/L 10  11  11      Lab Results  Component Value Date   MPROTEIN Not Observed 04/28/2021   Lab Results  Component  Value Date   KPAFRELGTCHN 33.6 (H) 04/28/2021   LAMBDASER 22.0 04/28/2021   KAPLAMBRATIO 1.53 04/28/2021    RADIOGRAPHIC STUDIES: No results found.  ASSESSMENT & PLAN Terry Page. 66 y.o. male with medical history significant for newly diagnosed metastatic adenocarcinoma of the lung who presents for a follow up visit.  # Metastatic Adenocarcinoma of the Lung  -- NGS testing from this patient shows no evidence of targetable mutation. --MRI brain shows no evidence of intracranial metastases --Started carboplatin, pembrolizumab, and pemetrexed as treatment for his cancer on 07/08/2021. --Received palliative radiation to osseous metastasis of the left shoulder from 08/11/2021-08/24/2021. Planned dose is 30 Gy in 10 fx.  Plan: --today is Day 1 Cycle 7 of Pem/Pem for metastatic adenocarcinoma of the lung --labs from today were reviewed. Labs show white blood cell count 12.0, hemoglobin 9.5, MCV 98.0, and platelets of 295 --CT C/A/P next due in Dec 2023. Last scan in Sept 2023 showed response to therapy.  --RTC in 3 weeks time port labs, f/u visit and Cycle 8, Day 1 of chemotherapy  #Left shoulder pain: --Secondary to left scapular metastasis.  --Most recent CT from 09/23/2021 showed stable osseous disease --Plan to obtain repeat CT imaging to further assess due to worsening pain.   #Normocytic anemia-worsening --Likely secondary to chemotherapy --Hgb 9.5 today improved with blood transfusion.  --Checking vitamin B12, folate, iron levels today.  --continue to monitor closely  #Pain Control -- Currently taking MS Contin 15 mg q 12 hours and MSIR for breakthrough pain.  --Reports left shoulder pain is increasing and so he is taking MSIR more frequently.  --Palliative care currently following --Continue senna docusate for constipation prophylaxis --Continue to monitor  #Supportive Care -- chemotherapy education complete -- port placed -- zofran 8mg  q8H PRN and compazine 10mg  PO  q6H for nausea -- EMLA cream for port -- Patient referred to palliative care for pain control. Pain control as above.    No orders of the defined types were placed in this encounter.   All questions were answered. The patient knows to call the clinic with any problems, questions or concerns.  I have spent a total of 30 minutes minutes of face-to-face and non-face-to-face time, preparing to see the patient, performing a medically appropriate examination, counseling and educating the patient, referring and communicating with other health care professionals, documenting clinical information in the electronic health record, and care coordination.   Dede Query PA-C Dept of Hematology and [place] at [facility] Phone: [phone]  11/16/2021 8:47 AM

## 2021-11-18 LAB — T4: T4, Total: 7.1 ug/dL (ref 4.5–12.0)

## 2021-11-30 ENCOUNTER — Ambulatory Visit (HOSPITAL_COMMUNITY): Payer: Medicare HMO

## 2021-12-02 ENCOUNTER — Ambulatory Visit: Payer: Medicare HMO

## 2021-12-02 ENCOUNTER — Ambulatory Visit: Payer: Medicare HMO | Admitting: Hematology and Oncology

## 2021-12-02 ENCOUNTER — Other Ambulatory Visit: Payer: Medicare HMO

## 2021-12-07 ENCOUNTER — Telehealth: Payer: Self-pay | Admitting: Hematology and Oncology

## 2021-12-07 NOTE — Telephone Encounter (Signed)
Patient called to r/s upcoming appointments. Appointments r/s and patient notified.

## 2021-12-08 ENCOUNTER — Ambulatory Visit (HOSPITAL_COMMUNITY)
Admission: RE | Admit: 2021-12-08 | Discharge: 2021-12-08 | Disposition: A | Discharge status: Home / Self Care | Payer: Medicare HMO | Source: Ambulatory Visit | Attending: Physician Assistant | Admitting: Physician Assistant

## 2021-12-08 DIAGNOSIS — C3412 Malignant neoplasm of upper lobe, left bronchus or lung: Secondary | ICD-10-CM | POA: Diagnosis present

## 2021-12-08 MED ORDER — IOHEXOL 300 MG/ML  SOLN
100.0000 mL | Freq: Once | INTRAMUSCULAR | Status: AC | PRN
  Administered 2021-12-08: 100 mL via INTRAVENOUS

## 2021-12-08 MED ORDER — SODIUM CHLORIDE (PF) 0.9 % IJ SOLN
INTRAMUSCULAR | Status: AC
  Filled 2021-12-08: qty 50

## 2021-12-08 MED ORDER — HEPARIN SOD (PORK) LOCK FLUSH 100 UNIT/ML IV SOLN
INTRAVENOUS | Status: AC
  Administered 2021-12-08: 500 [IU]
  Filled 2021-12-08: qty 5

## 2021-12-09 ENCOUNTER — Ambulatory Visit: Payer: Medicare HMO

## 2021-12-09 ENCOUNTER — Ambulatory Visit: Payer: Medicare HMO | Admitting: Physician Assistant

## 2021-12-09 ENCOUNTER — Other Ambulatory Visit: Payer: Medicare HMO

## 2021-12-12 ENCOUNTER — Telehealth: Payer: Self-pay | Admitting: *Deleted

## 2021-12-12 NOTE — Telephone Encounter (Signed)
TCT patient regarding recent scan results. Spoke to him and advised that his recent scan is good. No new or progressive disease noted.  Pt very happy about this. He is aware of his upcoming appts.

## 2021-12-12 NOTE — Telephone Encounter (Signed)
-----   Message from Lincoln Brigham, PA-C sent at 12/12/2021 12:41 PM EST ----- Beth: Can you notify patient that CT scan looks good without any new or progressive disease.  ----- Message ----- From: Interface, Rad Results In Sent: 12/09/2021   1:46 PM EST To: Lincoln Brigham, PA-C

## 2021-12-16 ENCOUNTER — Other Ambulatory Visit: Payer: Self-pay

## 2021-12-16 ENCOUNTER — Inpatient Hospital Stay: Payer: Medicare HMO

## 2021-12-16 ENCOUNTER — Inpatient Hospital Stay: Payer: Medicare HMO | Attending: Hematology and Oncology

## 2021-12-16 VITALS — BP 120/70 | HR 79 | Temp 97.9°F | Resp 18 | Wt 147.4 lb

## 2021-12-16 DIAGNOSIS — C7951 Secondary malignant neoplasm of bone: Secondary | ICD-10-CM | POA: Diagnosis not present

## 2021-12-16 DIAGNOSIS — K5900 Constipation, unspecified: Secondary | ICD-10-CM | POA: Diagnosis not present

## 2021-12-16 DIAGNOSIS — D649 Anemia, unspecified: Secondary | ICD-10-CM | POA: Insufficient documentation

## 2021-12-16 DIAGNOSIS — Z8546 Personal history of malignant neoplasm of prostate: Secondary | ICD-10-CM | POA: Diagnosis not present

## 2021-12-16 DIAGNOSIS — Z809 Family history of malignant neoplasm, unspecified: Secondary | ICD-10-CM | POA: Diagnosis not present

## 2021-12-16 DIAGNOSIS — C3412 Malignant neoplasm of upper lobe, left bronchus or lung: Secondary | ICD-10-CM | POA: Insufficient documentation

## 2021-12-16 DIAGNOSIS — I10 Essential (primary) hypertension: Secondary | ICD-10-CM | POA: Insufficient documentation

## 2021-12-16 DIAGNOSIS — G893 Neoplasm related pain (acute) (chronic): Secondary | ICD-10-CM | POA: Insufficient documentation

## 2021-12-16 DIAGNOSIS — Z515 Encounter for palliative care: Secondary | ICD-10-CM | POA: Diagnosis not present

## 2021-12-16 DIAGNOSIS — Z5111 Encounter for antineoplastic chemotherapy: Secondary | ICD-10-CM | POA: Insufficient documentation

## 2021-12-16 DIAGNOSIS — Z87891 Personal history of nicotine dependence: Secondary | ICD-10-CM | POA: Diagnosis not present

## 2021-12-16 DIAGNOSIS — Z923 Personal history of irradiation: Secondary | ICD-10-CM | POA: Diagnosis not present

## 2021-12-16 DIAGNOSIS — Z79891 Long term (current) use of opiate analgesic: Secondary | ICD-10-CM | POA: Insufficient documentation

## 2021-12-16 DIAGNOSIS — Z5112 Encounter for antineoplastic immunotherapy: Secondary | ICD-10-CM | POA: Insufficient documentation

## 2021-12-16 DIAGNOSIS — Z95828 Presence of other vascular implants and grafts: Secondary | ICD-10-CM

## 2021-12-16 LAB — CMP (CANCER CENTER ONLY)
ALT: 11 U/L (ref 0–44)
AST: 20 U/L (ref 15–41)
Albumin: 3.5 g/dL (ref 3.5–5.0)
Alkaline Phosphatase: 78 U/L (ref 38–126)
Anion gap: 7 (ref 5–15)
BUN: 15 mg/dL (ref 8–23)
CO2: 21 mmol/L — ABNORMAL LOW (ref 22–32)
Calcium: 9.5 mg/dL (ref 8.9–10.3)
Chloride: 101 mmol/L (ref 98–111)
Creatinine: 1.29 mg/dL — ABNORMAL HIGH (ref 0.61–1.24)
GFR, Estimated: >60 mL/min (ref >60)
Glucose, Bld: 144 mg/dL — ABNORMAL HIGH (ref 70–99)
Potassium: 4.8 mmol/L (ref 3.5–5.1)
Sodium: 129 mmol/L — ABNORMAL LOW (ref 135–145)
Total Bilirubin: 0.3 mg/dL (ref 0.3–1.2)
Total Protein: 8 g/dL (ref 6.5–8.1)

## 2021-12-16 LAB — CBC WITH DIFFERENTIAL (CANCER CENTER ONLY)
Abs Immature Granulocytes: 0.05 10*3/uL (ref 0.00–0.07)
Basophils Absolute: 0 10*3/uL (ref 0.0–0.1)
Basophils Relative: 0 %
Eosinophils Absolute: 0.5 10*3/uL (ref 0.0–0.5)
Eosinophils Relative: 5 %
HCT: 29.4 % — ABNORMAL LOW (ref 39.0–52.0)
Hemoglobin: 9.6 g/dL — ABNORMAL LOW (ref 13.0–17.0)
Immature Granulocytes: 1 %
Lymphocytes Relative: 11 %
Lymphs Abs: 1.1 10*3/uL (ref 0.7–4.0)
MCH: 31.4 pg (ref 26.0–34.0)
MCHC: 32.7 g/dL (ref 30.0–36.0)
MCV: 96.1 fL (ref 80.0–100.0)
Monocytes Absolute: 1.4 10*3/uL — ABNORMAL HIGH (ref 0.1–1.0)
Monocytes Relative: 14 %
Neutro Abs: 6.6 10*3/uL (ref 1.7–7.7)
Neutrophils Relative %: 69 %
Platelet Count: 331 10*3/uL (ref 150–400)
RBC: 3.06 MIL/uL — ABNORMAL LOW (ref 4.22–5.81)
RDW: 14.3 % (ref 11.5–15.5)
WBC Count: 9.5 10*3/uL (ref 4.0–10.5)
nRBC: 0 % (ref 0.0–0.2)

## 2021-12-16 LAB — TSH: TSH: 5.687 u[IU]/mL — ABNORMAL HIGH (ref 0.350–4.500)

## 2021-12-16 MED ORDER — HEPARIN SOD (PORK) LOCK FLUSH 100 UNIT/ML IV SOLN
500.0000 [IU] | Freq: Once | INTRAVENOUS | Status: AC | PRN
  Administered 2021-12-16: 500 [IU]

## 2021-12-16 MED ORDER — SODIUM CHLORIDE 0.9 % IV SOLN
200.0000 mg | Freq: Once | INTRAVENOUS | Status: AC
  Administered 2021-12-16: 200 mg via INTRAVENOUS
  Filled 2021-12-16: qty 200

## 2021-12-16 MED ORDER — SODIUM CHLORIDE 0.9 % IV SOLN
500.0000 mg/m2 | Freq: Once | INTRAVENOUS | Status: AC
  Administered 2021-12-16: 900 mg via INTRAVENOUS
  Filled 2021-12-16: qty 20

## 2021-12-16 MED ORDER — PROCHLORPERAZINE MALEATE 10 MG PO TABS
10.0000 mg | ORAL_TABLET | Freq: Once | ORAL | Status: AC
  Administered 2021-12-16: 10 mg via ORAL
  Filled 2021-12-16: qty 1

## 2021-12-16 MED ORDER — SODIUM CHLORIDE 0.9% FLUSH
10.0000 mL | Freq: Once | INTRAVENOUS | Status: AC
  Administered 2021-12-16: 10 mL

## 2021-12-16 MED ORDER — SODIUM CHLORIDE 0.9 % IV SOLN: Freq: Once | INTRAVENOUS | Status: AC

## 2021-12-16 MED ORDER — SODIUM CHLORIDE 0.9% FLUSH
10.0000 mL | INTRAVENOUS | Status: DC | PRN
  Administered 2021-12-16: 10 mL

## 2021-12-16 MED ORDER — MORPHINE SULFATE 15 MG PO TABS: 15.0000 mg | ORAL_TABLET | Freq: Four times a day (QID) | ORAL | 0 refills | Status: DC | PRN

## 2021-12-16 NOTE — Patient Instructions (Signed)
[facility]  Discharge Instructions: Thank you for choosing [facility] to provide your oncology and hematology care.   If you have a lab appointment with the [facility], please go directly to the [facility] and check in at the registration area.   Wear comfortable clothing and clothing appropriate for easy access to any Portacath or PICC line.   We strive to give you quality time with your provider. You may need to reschedule your appointment if you arrive late (15 or more minutes).  Arriving late affects you and other patients whose appointments are after yours.  Also, if you miss three or more appointments without notifying the office, you may be dismissed from the clinic at the provider's discretion.      For prescription refill requests, have your pharmacy contact our office and allow 72 hours for refills to be completed.    Today you received the following chemotherapy and/or immunotherapy agents: Beryle Flock, alimta      To help prevent nausea and vomiting after your treatment, we encourage you to take your nausea medication as directed.  BELOW ARE SYMPTOMS THAT SHOULD BE REPORTED IMMEDIATELY: *FEVER GREATER THAN 100.4 F (38 C) OR HIGHER *CHILLS OR SWEATING *NAUSEA AND VOMITING THAT IS NOT CONTROLLED WITH YOUR NAUSEA MEDICATION *UNUSUAL SHORTNESS OF BREATH *UNUSUAL BRUISING OR BLEEDING *URINARY PROBLEMS (pain or burning when urinating, or frequent urination) *BOWEL PROBLEMS (unusual diarrhea, constipation, pain near the anus) TENDERNESS IN MOUTH AND THROAT WITH OR WITHOUT PRESENCE OF ULCERS (sore throat, sores in mouth, or a toothache) UNUSUAL RASH, SWELLING OR PAIN  UNUSUAL VAGINAL DISCHARGE OR ITCHING   Items with * indicate a potential emergency and should be followed up as soon as possible or go to the Emergency Department if any problems should occur.  Please show the CHEMOTHERAPY ALERT CARD or IMMUNOTHERAPY ALERT CARD at  check-in to the Emergency Department and triage nurse.  Should you have questions after your visit or need to cancel or reschedule your appointment, please contact [facility]  Dept: [phone]  and follow the prompts.  Office hours are 8:00 a.m. to 4:30 p.m. Monday - Friday. Please note that voicemails left after 4:00 p.m. may not be returned until the following business day.  We are closed weekends and major holidays. You have access to a nurse at all times for urgent questions. Please call the main number to the clinic Dept: [phone] and follow the prompts.   For any non-urgent questions, you may also contact your provider using MyChart. We now offer e-Visits for anyone 80 and older to request care online for non-urgent symptoms. For details visit [email or web address].   Also download the MyChart app! Go to the app store, search "MyChart", open the app, select [facility], and log in with your MyChart username and password.  Masks are optional in the cancer centers. If you would like for your care team to wear a mask while they are taking care of you, please let them know. You may have one support person who is at least 68 years old accompany you for your appointments.

## 2021-12-16 NOTE — Patient Instructions (Signed)
[employer], Adult A central line is a long, thin tube (catheter) that is put into a vein so that it goes to a large vein above your heart. It can be used to: Give you medicine or fluids. Give you food and nutrients. Take blood or give you blood for testing or treatments. Types of central lines There are four main types of central lines: Peripherally inserted central catheter (PICC) line. This type is usually put in the upper arm and goes up the arm to the heart. Tunneled central line. This type is placed in a large vein in the neck, chest, or groin. It is tunneled under the skin and brought out through a second incision. Non-tunneled central line. This type is used for a shorter time than other types, usually for 7 days at the most. It is inserted in the neck, chest, or groin. Implanted port. This type can stay in place longer than other types of central lines. It is normally put in the upper chest but can also be placed in the upper arm or the belly. Surgery is needed to put it in and take it out. The type of central line you get will depend on how long you need it and your medical condition. Tell a doctor about: Any allergies you have. All medicines you are taking. These include vitamins, herbs, eye drops, creams, and over-the-counter medicines. Any problems you or family members have had with anesthetic medicines. Any blood disorders you have. Any surgeries you have had. Any medical conditions you have. Whether you are pregnant or may be pregnant. What are the risks? Generally, central lines are safe. However, problems may occur, including: Infection. A blood clot. Bleeding from the place where the central line was inserted. Getting a hole or crack in the central line. If this happens, the central line will need to be replaced. Central line failure. The catheter moving or coming out of place. What happens before the procedure? Medicines Ask your doctor about changing or  stopping: Your normal medicines. Vitamins, herbs, and supplements. Over-the-counter medicines. Do not take aspirin or ibuprofen unless you are told to. General instructions Follow instructions from your doctor about eating or drinking. For your safety, your doctor may: [place] the area of the procedure. Remove hair at the procedure site. Ask you to wash with a soap that kills germs. Plan to have a responsible adult take you home from the hospital or clinic. If you will be going home right after the procedure, plan to have a responsible adult care for you for the time you are told. This is important. What happens during the procedure? An IV tube will be put into one of your veins. You may be given: A sedative. This medicine helps you relax. Anesthetics. These medicines numb certain areas of your body. Your skin will be cleaned with a germ-killing (antiseptic) solution. You may be covered with clean drapes. Your blood pressure, heart rate, breathing rate, and blood oxygen level will be monitored during the procedure. The central line will be put into the vein and moved through it to the correct spot. The doctor may use X-ray equipment to help guide the central line to the right place. A bandage (dressing) will be placed over the insertion area. The procedure may vary among doctors and hospitals. What can I expect after the procedure? You will be monitored until you leave the hospital or clinic. This includes checking your blood pressure, heart rate, breathing rate, and blood oxygen level. Caps may  be placed on the ends of the central line tubing. If you were given a sedative during your procedure, do not drive or use machines until your doctor says that it is safe. Follow these instructions at home: Caring for the tube  Follow instructions from your doctor about: Flushing the tube. Cleaning the tube and the area around it. Only use germ-free (sterile) supplies to flush. The supplies  should be from your doctor, a pharmacy, or another place that your doctor recommends. Before you flush the tube or clean the area around the tube: Wash your hands with soap and water for at least 20 seconds. If you cannot use soap and water, use hand sanitizer. Clean the central line hub with rubbing alcohol. To do this: Scrub it using a twisting motion and rub for 10 to 15 seconds or for 30 twists. Follow the manufacturer's instructions. Be sure you scrub the top of the hub, not just the sides. Never reuse alcohol pads. Let the hub dry before use. Keep it from touching anything while drying. Caring for your skin Check the skin around the central line every day for signs of infection. Check for: Redness, swelling, or pain. Fluid or blood. Warmth. Pus or a bad smell. Keep the area where the tube was put in clean and dry. Change bandages only as told by your doctor. Keep your bandage dry. If a bandage gets wet, have it changed right away. General instructions Keep the tube clamped, unless it is being used. If you or someone else accidentally pulls on the tube, make sure: The bandage is okay. There is no bleeding. The tube has not been pulled out. Do not use scissors or sharp objects near the tube. Do not take baths, swim, or use a hot tub until your doctor says it is okay. Ask your doctor if you may take showers. You may only be allowed to take sponge baths. Ask your doctor what activities are safe for you. Your doctor may tell you not to lift anything or move your arm too much. Take over-the-counter and prescription medicines only as told by your doctor. Keep all follow-up visits. Storing and throwing away supplies Keep your supplies in a clean, dry location. Throw away any used syringes in a container that is only for sharp items (sharps container). You can buy a sharps container from a pharmacy, or you can make one by using an empty hard plastic bottle with a cover. Place any used  bandages or infusion bags into a plastic bag. Throw that bag in the trash. Contact a doctor if: You have any of these signs of infection where the tube was put in: Redness, swelling, or pain. Fluid or blood. Warmth. Pus or a bad smell. Get help right away if: You have: A fever or chills. Shortness of breath. Pain in your chest. A fast heartbeat. Swelling in your neck, face, chest, or arm. You feel dizzy or you faint. There are red lines coming from where the tube was put in. The area where the tube was put in is bleeding and the bleeding will not stop. Your tube is hard to flush. You do not get a blood return from the tube. The tube gets loose or comes out. The tube has a hole or a tear. The tube leaks. Summary A central line is a long, thin tube (catheter) that is put in your vein. It can be used to give you medicine, food, or fluids. Follow instructions from your doctor about flushing  and cleaning the tube. Keep the area where the tube was put in clean and dry. Ask your doctor what activities are safe for you. This information is not intended to replace advice given to you by your health care provider. Make sure you discuss any questions you have with your health care provider. Document Revised: 08/21/2019 Document Reviewed: 08/21/2019 Elsevier Patient Education  [place].

## 2021-12-16 NOTE — Progress Notes (Signed)
Per Dr. Lorenso Courier ok to treat with BP. Will bolus 500cc for BP.

## 2021-12-17 LAB — T4: T4, Total: 6.6 ug/dL (ref 4.5–12.0)

## 2021-12-19 ENCOUNTER — Other Ambulatory Visit: Payer: Self-pay | Admitting: Physician Assistant

## 2021-12-19 ENCOUNTER — Telehealth: Payer: Self-pay | Admitting: Hematology and Oncology

## 2021-12-19 MED ORDER — SENNOSIDES-DOCUSATE SODIUM 8.6-50 MG PO TABS: 2.0000 | ORAL_TABLET | Freq: Two times a day (BID) | ORAL | 3 refills | Status: DC

## 2021-12-19 NOTE — Telephone Encounter (Signed)
Called patient to notify of upcoming appointments. Patient had additional questions regarding appointment. Following up with RN. Patient notified.

## 2021-12-23 ENCOUNTER — Ambulatory Visit: Payer: Medicare HMO

## 2021-12-23 ENCOUNTER — Ambulatory Visit: Payer: Medicare HMO | Admitting: Hematology and Oncology

## 2021-12-23 ENCOUNTER — Encounter: Payer: Self-pay | Admitting: Nurse Practitioner

## 2021-12-23 ENCOUNTER — Inpatient Hospital Stay (HOSPITAL_BASED_OUTPATIENT_CLINIC_OR_DEPARTMENT_OTHER): Payer: Medicare HMO | Admitting: Nurse Practitioner

## 2021-12-23 ENCOUNTER — Other Ambulatory Visit: Payer: Medicare HMO

## 2021-12-23 DIAGNOSIS — C3490 Malignant neoplasm of unspecified part of unspecified bronchus or lung: Secondary | ICD-10-CM | POA: Diagnosis not present

## 2021-12-23 DIAGNOSIS — G893 Neoplasm related pain (acute) (chronic): Secondary | ICD-10-CM | POA: Diagnosis not present

## 2021-12-23 DIAGNOSIS — K5903 Drug induced constipation: Secondary | ICD-10-CM

## 2021-12-23 DIAGNOSIS — Z515 Encounter for palliative care: Secondary | ICD-10-CM | POA: Diagnosis not present

## 2021-12-23 DIAGNOSIS — R530 Neoplastic (malignant) related fatigue: Secondary | ICD-10-CM

## 2021-12-23 DIAGNOSIS — C7951 Secondary malignant neoplasm of bone: Secondary | ICD-10-CM

## 2021-12-23 NOTE — Progress Notes (Signed)
i    [place]  [phone]) [phone] [phone]) [phone]   Name: Terry Page. Date: 12/23/2021 MRN: [id number]  DOB: 10/05/1955  Patient Care Team: [name], MD as PCP - General (Family Medicine)   I connected with [name]. on 12/23/21 at 11:00 AM EST by phone and verified that I am speaking with the correct person using two identifiers.   I discussed the limitations, risks, security and privacy concerns of performing an evaluation and management service by telemedicine and the availability of in-person appointments. I also discussed with the patient that there may be a patient responsible charge related to this service. The patient expressed understanding and agreed to proceed.   Other persons participating in the visit and their role in the encounter:    Patient's location: Home Provider's location: [facility]    INTERVAL HISTORY: Terry Page. is a 66 y.o. 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  Palliative ask to see for symptom management.  SOCIAL HISTORY:     reports that he quit smoking about 2 years ago. His smoking use included cigarettes. He has never used smokeless tobacco. He reports that he does not currently use alcohol after a past usage of about 12.0 standard drinks of alcohol per week. He reports that he does not use drugs.  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 Gery Pray   Incidental lung nodule, greater than or equal to 41mm 10/22/2017   Left upper lobe - discovered on CTA    Prostate cancer ([place])    Spontaneous pneumothorax 10/20/2017   right   Tobacco abuse     ALLERGIES:  has No Known Allergies.  MEDICATIONS:  Current Outpatient Medications  Medication Sig Dispense Refill   acetaminophen (TYLENOL) 325 MG tablet Take 2 tablets (650 mg total) by mouth every 6 (six) hours as needed for mild pain or headache (fever >/= 101).     albuterol (VENTOLIN HFA) 108 (90 Base) MCG/ACT inhaler Inhale 2 puffs into the lungs every 6 (six) hours as needed for wheezing or shortness of breath. 8 g 1   amLODipine (NORVASC) 10 MG tablet Take 1 tablet by mouth daily.     atorvastatin (LIPITOR) 20 MG tablet Take 20 mg by mouth daily.     docusate sodium (COLACE) 100 MG capsule Take 100 mg by mouth 2 (two) times daily.     folic acid (FOLVITE) 1 MG tablet Take 1 tablet (1 mg total) by mouth daily. 30 tablet 3   hydrOXYzine (ATARAX) 25 MG tablet Take 1 tablet (25 mg total) by mouth at bedtime as needed for itching. 30 tablet 11   ipratropium-albuterol (DUONEB) 0.5-2.5 (3) MG/3ML SOLN Take 3 mLs by nebulization every 6 (six) hours as needed. (Patient taking differently: Take 3 mLs by nebulization every 6 (six) hours as needed (shortness of breathing or wheezing).) 360 mL 0   lidocaine (LIDODERM) 5 % Place 1 patch onto the skin daily. Remove & Discard patch within 12 hours or as directed by MD. Apply to low  back 30 patch 0   lidocaine-prilocaine (EMLA) cream Apply 1 Application topically as needed. 30 g 0   losartan (COZAAR) 100 MG tablet Take 100 mg by mouth daily.     morphine (MS CONTIN) 15 MG 12 hr tablet Take 1 tablet (15 mg total) by mouth every 12 (twelve) hours. 60 tablet 0   morphine (MSIR) 15 MG tablet Take 1 tablet (15 mg total) by mouth every 6 (six) hours as needed for severe pain. 60 tablet 0   olmesartan (BENICAR) 40 MG tablet Take 40 mg by mouth daily.     ondansetron (ZOFRAN) 8 MG tablet Take 1 tablet (8 mg total) by mouth every 8 (eight) hours as needed. 30 tablet 0    polyethylene glycol (MIRALAX) 17 g packet Take 17 g by mouth daily. 30 each 2   potassium chloride SA (KLOR-CON M) 20 MEQ tablet TAKE 1 TABLET(20 MEQ) BY MOUTH DAILY 90 tablet 0   prochlorperazine (COMPAZINE) 10 MG tablet Take 1 tablet (10 mg total) by mouth every 6 (six) hours as needed for nausea or vomiting. 30 tablet 0   senna-docusate (STIMULANT LAXATIVE) 8.6-50 MG tablet Take 2 tablets by mouth 2 (two) times daily. 120 tablet 3   thiamine 100 MG tablet Take 1 tablet (100 mg total) by mouth daily.     No current facility-administered medications for this visit.    VITAL SIGNS: There were no vitals taken for this visit. There were no vitals filed for this visit.  Estimated body mass index is 22.41 kg/m as calculated from the following:   Height as of 11/16/21: 5\' 8"  (1.727 m).   Weight as of 12/16/21: 147 lb 6.4 oz (66.9 kg).   PERFORMANCE STATUS (ECOG) : 1 - Symptomatic but completely ambulatory   IMPRESSION: I connected with Mr. Witherington by phone for symptom management follow-up. Denies any acute distress. Denies nausea, vomiting, or diarrhea.  Continues to feel well.  Appetite is good. Looking forward to spending the holidays with his family.   Neoplasm related pain  Pain is well controlled on current regimen. Does not require daily use of MS IR. He is only taking if pain is severe which generally occurs with increased activity.  Encouraged him to listen to his body and not overdo it. Tolerating MS Contin as prescribed.   We will continue to closely monitor.   Constipation  Constipation is improved. He is taking Miralax and Senna as recommended.   I discussed the importance of continued conversation with family and their medical providers regarding overall plan of care and treatment options, ensuring decisions are within the context of the patients values and GOCs.  PLAN: Continue with MS Contin 15mg  daily. MS IR 15 mg as needed.  Not taking regularly  Miralax/Senna daily  I  will plan to see patient back in 4-6 weeks in collaboration with his other oncology appointments. Will give him a call next week for close symptom follow-up.    Patient expressed understanding and was in agreement with this plan. He also understands that He can call the clinic at any time with any questions, concerns, or complaints.    Any controlled substances utilized were prescribed in the context of palliative care. PDMP has been reviewed.    Time Total: 20 min   Visit consisted of counseling and education dealing with the complex and emotionally intense issues of symptom management and palliative care in the setting of serious and potentially life-threatening illness.Greater than 50%  of this time  was spent counseling and coordinating care related to the above assessment and plan.  Alda Lea, AGPCNP-BC  Palliative Medicine Team/[place]

## 2021-12-30 ENCOUNTER — Ambulatory Visit: Payer: Medicare HMO

## 2021-12-30 ENCOUNTER — Other Ambulatory Visit: Payer: Medicare HMO

## 2021-12-30 ENCOUNTER — Ambulatory Visit: Payer: Medicare HMO | Admitting: Hematology and Oncology

## 2022-01-06 ENCOUNTER — Ambulatory Visit: Payer: Medicare HMO | Admitting: Hematology and Oncology

## 2022-01-06 ENCOUNTER — Ambulatory Visit: Payer: Medicare HMO

## 2022-01-06 ENCOUNTER — Other Ambulatory Visit: Payer: Self-pay

## 2022-01-06 ENCOUNTER — Encounter: Payer: Self-pay | Admitting: Hematology and Oncology

## 2022-01-06 ENCOUNTER — Other Ambulatory Visit: Payer: Self-pay | Admitting: *Deleted

## 2022-01-06 ENCOUNTER — Inpatient Hospital Stay: Payer: Medicare HMO

## 2022-01-06 ENCOUNTER — Inpatient Hospital Stay: Payer: Medicare HMO | Attending: Hematology and Oncology | Admitting: Hematology and Oncology

## 2022-01-06 ENCOUNTER — Other Ambulatory Visit: Payer: Medicare HMO

## 2022-01-06 VITALS — BP 130/84 | HR 91 | Temp 98.3°F | Resp 16 | Wt 143.2 lb

## 2022-01-06 DIAGNOSIS — D649 Anemia, unspecified: Secondary | ICD-10-CM | POA: Insufficient documentation

## 2022-01-06 DIAGNOSIS — Z79899 Other long term (current) drug therapy: Secondary | ICD-10-CM | POA: Insufficient documentation

## 2022-01-06 DIAGNOSIS — Z79891 Long term (current) use of opiate analgesic: Secondary | ICD-10-CM | POA: Diagnosis not present

## 2022-01-06 DIAGNOSIS — K5900 Constipation, unspecified: Secondary | ICD-10-CM | POA: Diagnosis not present

## 2022-01-06 DIAGNOSIS — C3412 Malignant neoplasm of upper lobe, left bronchus or lung: Secondary | ICD-10-CM

## 2022-01-06 DIAGNOSIS — Z5111 Encounter for antineoplastic chemotherapy: Secondary | ICD-10-CM | POA: Insufficient documentation

## 2022-01-06 DIAGNOSIS — Z5112 Encounter for antineoplastic immunotherapy: Secondary | ICD-10-CM | POA: Diagnosis not present

## 2022-01-06 DIAGNOSIS — Z95828 Presence of other vascular implants and grafts: Secondary | ICD-10-CM

## 2022-01-06 DIAGNOSIS — I10 Essential (primary) hypertension: Secondary | ICD-10-CM | POA: Insufficient documentation

## 2022-01-06 DIAGNOSIS — G893 Neoplasm related pain (acute) (chronic): Secondary | ICD-10-CM | POA: Diagnosis not present

## 2022-01-06 DIAGNOSIS — Z515 Encounter for palliative care: Secondary | ICD-10-CM | POA: Diagnosis not present

## 2022-01-06 DIAGNOSIS — Z8546 Personal history of malignant neoplasm of prostate: Secondary | ICD-10-CM | POA: Insufficient documentation

## 2022-01-06 DIAGNOSIS — C7951 Secondary malignant neoplasm of bone: Secondary | ICD-10-CM | POA: Diagnosis not present

## 2022-01-06 DIAGNOSIS — Z87891 Personal history of nicotine dependence: Secondary | ICD-10-CM | POA: Insufficient documentation

## 2022-01-06 DIAGNOSIS — R519 Headache, unspecified: Secondary | ICD-10-CM

## 2022-01-06 DIAGNOSIS — Z809 Family history of malignant neoplasm, unspecified: Secondary | ICD-10-CM | POA: Insufficient documentation

## 2022-01-06 DIAGNOSIS — Z923 Personal history of irradiation: Secondary | ICD-10-CM | POA: Diagnosis not present

## 2022-01-06 LAB — CBC WITH DIFFERENTIAL (CANCER CENTER ONLY)
Abs Immature Granulocytes: 0.09 10*3/uL — ABNORMAL HIGH (ref 0.00–0.07)
Basophils Absolute: 0 10*3/uL (ref 0.0–0.1)
Basophils Relative: 1 %
Eosinophils Absolute: 0.6 10*3/uL — ABNORMAL HIGH (ref 0.0–0.5)
Eosinophils Relative: 7 %
HCT: 25.2 % — ABNORMAL LOW (ref 39.0–52.0)
Hemoglobin: 8.4 g/dL — ABNORMAL LOW (ref 13.0–17.0)
Immature Granulocytes: 1 %
Lymphocytes Relative: 12 %
Lymphs Abs: 1 10*3/uL (ref 0.7–4.0)
MCH: 31.5 pg (ref 26.0–34.0)
MCHC: 33.3 g/dL (ref 30.0–36.0)
MCV: 94.4 fL (ref 80.0–100.0)
Monocytes Absolute: 1.4 10*3/uL — ABNORMAL HIGH (ref 0.1–1.0)
Monocytes Relative: 16 %
Neutro Abs: 5.4 10*3/uL (ref 1.7–7.7)
Neutrophils Relative %: 63 %
Platelet Count: 441 10*3/uL — ABNORMAL HIGH (ref 150–400)
RBC: 2.67 MIL/uL — ABNORMAL LOW (ref 4.22–5.81)
RDW: 14.8 % (ref 11.5–15.5)
WBC Count: 8.5 10*3/uL (ref 4.0–10.5)
nRBC: 0 % (ref 0.0–0.2)

## 2022-01-06 LAB — CMP (CANCER CENTER ONLY)
ALT: 31 U/L (ref 0–44)
AST: 34 U/L (ref 15–41)
Albumin: 3.4 g/dL — ABNORMAL LOW (ref 3.5–5.0)
Alkaline Phosphatase: 73 U/L (ref 38–126)
Anion gap: 6 (ref 5–15)
BUN: 12 mg/dL (ref 8–23)
CO2: 20 mmol/L — ABNORMAL LOW (ref 22–32)
Calcium: 9.1 mg/dL (ref 8.9–10.3)
Chloride: 107 mmol/L (ref 98–111)
Creatinine: 0.89 mg/dL (ref 0.61–1.24)
GFR, Estimated: >60 mL/min (ref >60)
Glucose, Bld: 93 mg/dL (ref 70–99)
Potassium: 4.5 mmol/L (ref 3.5–5.1)
Sodium: 133 mmol/L — ABNORMAL LOW (ref 135–145)
Total Bilirubin: 0.2 mg/dL — ABNORMAL LOW (ref 0.3–1.2)
Total Protein: 7.5 g/dL (ref 6.5–8.1)

## 2022-01-06 LAB — TSH: TSH: 1.037 u[IU]/mL (ref 0.350–4.500)

## 2022-01-06 MED ORDER — ACETAMINOPHEN 325 MG PO TABS
650.0000 mg | ORAL_TABLET | ORAL | Status: AC
  Administered 2022-01-06: 650 mg via ORAL
  Filled 2022-01-06: qty 2

## 2022-01-06 MED ORDER — SODIUM CHLORIDE 0.9% FLUSH
10.0000 mL | Freq: Once | INTRAVENOUS | Status: AC
  Administered 2022-01-06: 10 mL

## 2022-01-06 MED ORDER — SODIUM CHLORIDE 0.9 % IV SOLN
500.0000 mg/m2 | Freq: Once | INTRAVENOUS | Status: AC
  Administered 2022-01-06: 900 mg via INTRAVENOUS
  Filled 2022-01-06: qty 20

## 2022-01-06 MED ORDER — PROCHLORPERAZINE MALEATE 10 MG PO TABS
10.0000 mg | ORAL_TABLET | Freq: Once | ORAL | Status: AC
  Administered 2022-01-06: 10 mg via ORAL
  Filled 2022-01-06: qty 1

## 2022-01-06 MED ORDER — SENNOSIDES-DOCUSATE SODIUM 8.6-50 MG PO TABS: 2.0000 | ORAL_TABLET | Freq: Two times a day (BID) | ORAL | 3 refills | Status: DC

## 2022-01-06 MED ORDER — VITAMIN B-12 1000 MCG PO TABS: 1000.0000 ug | ORAL_TABLET | Freq: Every day | ORAL | 1 refills | Status: DC

## 2022-01-06 MED ORDER — SODIUM CHLORIDE 0.9 % IV SOLN: Freq: Once | INTRAVENOUS | Status: AC

## 2022-01-06 MED ORDER — SODIUM CHLORIDE 0.9 % IV SOLN
200.0000 mg | Freq: Once | INTRAVENOUS | Status: AC
  Administered 2022-01-06: 200 mg via INTRAVENOUS
  Filled 2022-01-06: qty 200

## 2022-01-06 MED ORDER — CYANOCOBALAMIN 1000 MCG/ML IJ SOLN: 1000.0000 ug | Freq: Once | INTRAMUSCULAR | Status: DC

## 2022-01-06 NOTE — Patient Instructions (Signed)
[facility]  Discharge Instructions: Thank you for choosing [facility] to provide your oncology and hematology care.   If you have a lab appointment with the [facility], please go directly to the [facility] and check in at the registration area.   Wear comfortable clothing and clothing appropriate for easy access to any Portacath or PICC line.   We strive to give you quality time with your provider. You may need to reschedule your appointment if you arrive late (15 or more minutes).  Arriving late affects you and other patients whose appointments are after yours.  Also, if you miss three or more appointments without notifying the office, you may be dismissed from the clinic at the provider's discretion.      For prescription refill requests, have your pharmacy contact our office and allow 72 hours for refills to be completed.    Today you received the following chemotherapy and/or immunotherapy agents: Beryle Flock, alimta      To help prevent nausea and vomiting after your treatment, we encourage you to take your nausea medication as directed.  BELOW ARE SYMPTOMS THAT SHOULD BE REPORTED IMMEDIATELY: *FEVER GREATER THAN 100.4 F (38 C) OR HIGHER *CHILLS OR SWEATING *NAUSEA AND VOMITING THAT IS NOT CONTROLLED WITH YOUR NAUSEA MEDICATION *UNUSUAL SHORTNESS OF BREATH *UNUSUAL BRUISING OR BLEEDING *URINARY PROBLEMS (pain or burning when urinating, or frequent urination) *BOWEL PROBLEMS (unusual diarrhea, constipation, pain near the anus) TENDERNESS IN MOUTH AND THROAT WITH OR WITHOUT PRESENCE OF ULCERS (sore throat, sores in mouth, or a toothache) UNUSUAL RASH, SWELLING OR PAIN  UNUSUAL VAGINAL DISCHARGE OR ITCHING   Items with * indicate a potential emergency and should be followed up as soon as possible or go to the Emergency Department if any problems should occur.  Please show the CHEMOTHERAPY ALERT CARD or IMMUNOTHERAPY ALERT CARD at  check-in to the Emergency Department and triage nurse.  Should you have questions after your visit or need to cancel or reschedule your appointment, please contact [facility]  Dept: [phone]  and follow the prompts.  Office hours are 8:00 a.m. to 4:30 p.m. Monday - Friday. Please note that voicemails left after 4:00 p.m. may not be returned until the following business day.  We are closed weekends and major holidays. You have access to a nurse at all times for urgent questions. Please call the main number to the clinic Dept: [phone] and follow the prompts.   For any non-urgent questions, you may also contact your provider using MyChart. We now offer e-Visits for anyone 17 and older to request care online for non-urgent symptoms. For details visit [email or web address].   Also download the MyChart app! Go to the app store, search "MyChart", open the app, select [facility], and log in with your MyChart username and password.  Masks are optional in the cancer centers. If you would like for your care team to wear a mask while they are taking care of you, please let them know. You may have one support person who is at least 68 years old accompany you for your appointments.

## 2022-01-06 NOTE — Progress Notes (Signed)
[facility] Telephone:(336) [phone]   Fax:(336) [phone]  PROGRESS NOTE  Patient Care Team: Aliene Beams, MD as PCP - General (Family Medicine)  Hematological/Oncological History # Metastatic Adenocarcinoma of the Lung  01/07/2020 : CT of the chest with contrast performed which showed interval enlargement of a spiculated nodule in the central left upper lobe measuring 1.2 x 1.0 cm and highly suspicious for primary lung malignancy 02/06/2020 :PET scan performed which showed a hypermetabolic irregular solid 1.3 cm left upper lobe pulmonary nodule compatible with malignancy without any other hypermetabolic lesions 02/04/4142- 04/19/2020:  SBRT to the lung lesion 04/28/2021: CT C/A/P showed multifocal lytic bone metastases are identified. Lesion within the spine of the left scapula and lesions involving bilateral iliac bones and left sacral wing. Establish care with Dr. Leonides Schanz  05/03/2021: biopsy of right iliac lytic lesion showed metastatic carcinoma, consistent with lung primary.  07/08/2021: Cycle 1 Day 1 of Carbo/Pem/Pem 07/29/2021: Cycle 2 Day 1 of Carbo/Pem/Pem 08/11/2021-08/24/2021: Received palliative radiation to osseous metastasis in the left shoulder. 30 Gy in 10 fractions.  08/19/2021: Cycle 3 Day 1 of Carbo/Pem/Pem 09/09/2021: Cycle 4 Day 1 of Carbo/Pem/Pem 09/23/2021: CT CAP: stable disease 09/30/2021: Cycle 5 Day 1 of Pem/Pem 10/21/2021: Cycle 6 Day 1 of Pembrolizumab. Held pemetrexed due to anemia. 11/16/2021: Cycle 7 Day 1 of Pem/Pem.   12/16/2021: Cycle 8 Day 1 of Pem/Pem.   01/06/2022: Cycle 9 Day 1 of Pem/Pem.   #Adenocarcinoma of the Prostate, [id number] 08/19/2019-10/07/2019: 70 Gy in 28 fractions of 2.5 Gy.  Radiation to the prostate was under the care of Dr. Margaretmary Dys  Interval History:  Terry Page. 67 y.o. male with medical history significant for metastatic adenocarcinoma of the lung who presents for a follow up visit. The patient's last visit was on  12/16/2021. In the interim since the last visit he has completed Cycle 8 of chemotherapy.  On exam today Terry Page reports he tolerated his last chemotherapy cycle quite well.  He reports that he feels like the morphine he is taking is making him itch.  He notes it is predominantly on his legs.  He notes that his pain is currently under good control and he only takes Tylenol as needed.  He notes that he is worried that he has toenails growing in a odd fashion, curling under his toes.  He report his appetite is good and he is eating well.  He notes that he enjoys eating breakfast sandwiches as well as cereal.  He notes that he is still losing weight.  He notes he is not suffering from any nausea, vomiting, or diarrhea.  He reports good energy levels.   He denies any fevers, chills, night sweats, shortness of breath, chest pain, cough, peripheral edema or neuropathy.  He has no other complaints.A full 10 point ROS is listed below.  MEDICAL HISTORY:  Past Medical History:  Diagnosis Date   Alcohol abuse    Asthma    Bullous emphysema ([name])    Essential hypertension 04/10/2020   Hemorrhoids    History of radiation therapy 04/08/20-04/19/20   IMRT- Left lung- Dr. Antony Blackbird   Incidental lung nodule, greater than or equal to 52mm 10/22/2017   Left upper lobe - discovered on CTA   Prostate cancer ([name])    Spontaneous pneumothorax 10/20/2017   right   Tobacco abuse     SURGICAL HISTORY: Past Surgical History:  Procedure Laterality Date   BACK SURGERY     IR IMAGING  GUIDED PORT INSERTION  06/29/2021   PROSTATE BIOPSY      SOCIAL HISTORY: Social History   Socioeconomic History   Marital status: Single    Spouse name: Not on file   Number of children: Not on file   Years of education: Not on file   Highest education level: Not on file  Occupational History   Occupation: retired  Tobacco Use   Smoking status: Former    Types: Cigarettes    Quit date: 11/22/2019    Years since quitting:  2.1   Smokeless tobacco: Never   Tobacco comments:    Patient reports quit 3 years ago. 06/25/20. [facility]  Vaping Use   Vaping Use: Never used  Substance and Sexual Activity   Alcohol use: Not Currently    Alcohol/week: 12.0 standard drinks of alcohol    Types: 12 Cans of beer per week    Comment: daily   Drug use: No   Sexual activity: Not Currently  Other Topics Concern   Not on file  Social History Narrative   Not on file   Social Determinants of Health   Financial Resource Strain: Not on file  Food Insecurity: Not on file  Transportation Needs: Unmet Transportation Needs (07/18/2021)   PRAPARE - Administrator, Civil Service (Medical): Yes    Lack of Transportation (Non-Medical): Yes  Physical Activity: Not on file  Stress: Not on file  Social Connections: Not on file  Intimate Partner Violence: Not on file    FAMILY HISTORY: Family History  Problem Relation Age of Onset   Cancer Cousin        maternal cousin   Cancer Cousin        paternal cousin   Cancer Cousin    Colon polyps Neg Hx    Pancreatic disease Neg Hx    Pancreatic cancer Neg Hx    Breast cancer Neg Hx    Colon cancer Neg Hx     ALLERGIES:  has No Known Allergies.  MEDICATIONS:  Current Outpatient Medications  Medication Sig Dispense Refill   cyanocobalamin (VITAMIN B12) 1000 MCG tablet Take 1 tablet (1,000 mcg total) by mouth daily. 90 tablet 1   acetaminophen (TYLENOL) 325 MG tablet Take 2 tablets (650 mg total) by mouth every 6 (six) hours as needed for mild pain or headache (fever >/= 101).     albuterol (VENTOLIN HFA) 108 (90 Base) MCG/ACT inhaler Inhale 2 puffs into the lungs every 6 (six) hours as needed for wheezing or shortness of breath. 8 g 1   amLODipine (NORVASC) 10 MG tablet Take 1 tablet by mouth daily.     atorvastatin (LIPITOR) 20 MG tablet Take 20 mg by mouth daily.     docusate sodium (COLACE) 100 MG capsule Take 100 mg by mouth 2 (two) times daily.     folic acid  (FOLVITE) 1 MG tablet Take 1 tablet (1 mg total) by mouth daily. 30 tablet 3   hydrOXYzine (ATARAX) 25 MG tablet Take 1 tablet (25 mg total) by mouth at bedtime as needed for itching. 30 tablet 11   ipratropium-albuterol (DUONEB) 0.5-2.5 (3) MG/3ML SOLN Take 3 mLs by nebulization every 6 (six) hours as needed. (Patient taking differently: Take 3 mLs by nebulization every 6 (six) hours as needed (shortness of breathing or wheezing).) 360 mL 0   lidocaine (LIDODERM) 5 % Place 1 patch onto the skin daily. Remove & Discard patch within 12 hours or as directed by MD. Apply  to low back 30 patch 0   lidocaine-prilocaine (EMLA) cream Apply 1 Application topically as needed. 30 g 0   losartan (COZAAR) 100 MG tablet Take 100 mg by mouth daily.     morphine (MS CONTIN) 15 MG 12 hr tablet Take 1 tablet (15 mg total) by mouth every 12 (twelve) hours. (Patient not taking: Reported on 01/06/2022) 60 tablet 0   morphine (MSIR) 15 MG tablet Take 1 tablet (15 mg total) by mouth every 6 (six) hours as needed for severe pain. 60 tablet 0   olmesartan (BENICAR) 40 MG tablet Take 40 mg by mouth daily.     ondansetron (ZOFRAN) 8 MG tablet Take 1 tablet (8 mg total) by mouth every 8 (eight) hours as needed. 30 tablet 0   polyethylene glycol (MIRALAX) 17 g packet Take 17 g by mouth daily. 30 each 2   potassium chloride SA (KLOR-CON M) 20 MEQ tablet TAKE 1 TABLET(20 MEQ) BY MOUTH DAILY 90 tablet 0   prochlorperazine (COMPAZINE) 10 MG tablet Take 1 tablet (10 mg total) by mouth every 6 (six) hours as needed for nausea or vomiting. 30 tablet 0   senna-docusate (STIMULANT LAXATIVE) 8.6-50 MG tablet Take 2 tablets by mouth 2 (two) times daily. May add additional tablet as needed 150 tablet 3   thiamine 100 MG tablet Take 1 tablet (100 mg total) by mouth daily.     No current facility-administered medications for this visit.    REVIEW OF SYSTEMS:   Constitutional: ( - ) fevers, ( - )  chills , ( - ) night sweats Eyes: ( - )  blurriness of vision, ( - ) double vision, ( - ) watery eyes Ears, nose, mouth, throat, and face: ( - ) mucositis, ( - ) sore throat Respiratory: ( - ) cough, ( - ) dyspnea, ( - ) wheezes Cardiovascular: ( - ) palpitation, ( - ) chest discomfort, ( - ) lower extremity swelling Gastrointestinal:  ( - ) nausea, ( - ) heartburn, ( - ) change in bowel habits Skin: ( - ) abnormal skin rashes Lymphatics: ( - ) new lymphadenopathy, ( - ) easy bruising Neurological: ( - ) numbness, ( - ) tingling, ( - ) new weaknesses Behavioral/Psych: ( - ) mood change, ( - ) new changes  All other systems were reviewed with the patient and are negative.  PHYSICAL EXAMINATION: ECOG PERFORMANCE STATUS: 1 - Symptomatic but completely ambulatory  Vitals:   01/06/22 0957  BP: 130/84  Pulse: 91  Resp: 16  Temp: 98.3 F (36.8 C)  SpO2: 100%   Filed Weights   01/06/22 0957  Weight: 143 lb 3.2 oz (65 kg)    GENERAL: Well-appearing elderly African-American male, alert, no distress and comfortable SKIN: skin color, texture, turgor are normal, no rashes or significant lesions EYES: conjunctiva are pink and non-injected, sclera clear LUNGS: clear to auscultation and percussion with normal breathing effort HEART: regular rate & rhythm and no murmurs and no lower extremity edema Musculoskeletal: no cyanosis of digits and no clubbing  PSYCH: alert & oriented x 3, fluent speech NEURO: no focal motor/sensory deficits  LABORATORY DATA:  I have reviewed the data as listed    Latest Ref Rng & Units 01/06/2022    9:41 AM 12/16/2021    8:53 AM 11/16/2021    8:07 AM  CBC  WBC 4.0 - 10.5 K/uL 8.5  9.5  12.0   Hemoglobin 13.0 - 17.0 g/dL 8.4  9.6  9.5  Hematocrit 39.0 - 52.0 % 25.2  29.4  28.9   Platelets 150 - 400 K/uL 441  331  295        Latest Ref Rng & Units 01/06/2022    9:41 AM 12/16/2021    8:53 AM 11/16/2021    8:07 AM  CMP  Glucose 70 - 99 mg/dL 93  144  96   BUN 8 - 23 mg/dL 12  15  16    Creatinine  0.61 - 1.24 mg/dL 0.89  1.29  1.15   Sodium 135 - 145 mmol/L 133  129  130   Potassium 3.5 - 5.1 mmol/L 4.5  4.8  4.6   Chloride 98 - 111 mmol/L 107  101  104   CO2 22 - 32 mmol/L 20  21  19    Calcium 8.9 - 10.3 mg/dL 9.1  9.5  9.1   Total Protein 6.5 - 8.1 g/dL 7.5  8.0  8.3   Total Bilirubin 0.3 - 1.2 mg/dL 0.2  0.3  0.6   Alkaline Phos 38 - 126 U/L 73  78  73   AST 15 - 41 U/L 34  20  16   ALT 0 - 44 U/L 31  11  7      Lab Results  Component Value Date   MPROTEIN Not Observed 04/28/2021   Lab Results  Component Value Date   KPAFRELGTCHN 33.6 (H) 04/28/2021   LAMBDASER 22.0 04/28/2021   KAPLAMBRATIO 1.53 04/28/2021    RADIOGRAPHIC STUDIES: No results found.  ASSESSMENT & PLAN Terry Page. 67 y.o. male with medical history significant for newly diagnosed metastatic adenocarcinoma of the lung who presents for a follow up visit.  # Metastatic Adenocarcinoma of the Lung  -- NGS testing from this patient shows no evidence of targetable mutation. --MRI brain shows no evidence of intracranial metastases --Started carboplatin, pembrolizumab, and pemetrexed as treatment for his cancer on 07/08/2021. --Received palliative radiation to osseous metastasis of the left shoulder from 08/11/2021-08/24/2021. Planned dose is 30 Gy in 10 fx.  Plan: --today is Day 1 Cycle 9 of Pem/Pem for metastatic adenocarcinoma of the lung --labs from today were reviewed. Labs show white blood cell count 8.5, hemoglobin 8.4, MCV 94.4, and platelets of 441. --CT C/A/P next due in March 2024. Last scan in Dec 2023 showed response to therapy.  --RTC in 3 weeks time port labs, f/u visit and Cycle 10, Day 1 of chemotherapy  #Left shoulder pain: --Secondary to left scapular metastasis.  --Most recent CT from 09/23/2021 showed stable osseous disease  #Normocytic anemia-worsening --Likely secondary to chemotherapy --Hgb 8.4 today  --continue to monitor closely  #Pain Control -- Currently taking MS  Contin 15 mg q 12 hours and MSIR for breakthrough pain.  --Reports left shoulder pain is increasing and so he is taking MSIR more frequently.  --Palliative care currently following --Continue senna docusate for constipation prophylaxis --Continue to monitor  #Supportive Care -- chemotherapy education complete -- port placed -- zofran 8mg  q8H PRN and compazine 10mg  PO q6H for nausea -- EMLA cream for port -- Patient referred to palliative care for pain control. Pain control as above.    Orders Placed This Encounter  Procedures   TSH    Standing Status:   Future    Standing Expiration Date:   02/17/2023   T4    Standing Status:   Future    Standing Expiration Date:   02/17/2023   CBC with Differential ([place] Only)  Standing Status:   Future    Standing Expiration Date:   02/18/2023   CMP (Cancer Center only)    Standing Status:   Future    Standing Expiration Date:   02/18/2023    All questions were answered. The patient knows to call the clinic with any problems, questions or concerns.  I have spent a total of 30 minutes minutes of face-to-face and non-face-to-face time, preparing to see the patient, performing a medically appropriate examination, counseling and educating the patient, referring and communicating with other health care professionals, documenting clinical information in the electronic health record, and care coordination.   Ulysees Barns, MD Department of Hematology/Oncology [facility] at [facility] Phone: [phone] Pager: [phone] Email: [email or web address]   01/16/2022 7:51 PM

## 2022-01-08 LAB — T4: T4, Total: 5.9 ug/dL (ref 4.5–12.0)

## 2022-01-13 ENCOUNTER — Encounter: Payer: Self-pay | Admitting: Podiatry

## 2022-01-13 ENCOUNTER — Ambulatory Visit: Payer: Medicare HMO

## 2022-01-13 ENCOUNTER — Ambulatory Visit: Payer: Medicare HMO | Admitting: Hematology and Oncology

## 2022-01-13 ENCOUNTER — Other Ambulatory Visit: Payer: Medicare HMO

## 2022-01-13 ENCOUNTER — Ambulatory Visit: Payer: Medicare HMO | Admitting: Physician Assistant

## 2022-01-13 ENCOUNTER — Ambulatory Visit: Payer: Medicare HMO | Admitting: Podiatry

## 2022-01-13 DIAGNOSIS — R0989 Other specified symptoms and signs involving the circulatory and respiratory systems: Secondary | ICD-10-CM

## 2022-01-13 DIAGNOSIS — I739 Peripheral vascular disease, unspecified: Secondary | ICD-10-CM | POA: Insufficient documentation

## 2022-01-13 NOTE — Progress Notes (Signed)
This patient returns to my office for at risk foot care.  This patient requires this care by a professional since this patient will be at risk due to having kidney disease and his is taking chemo.  This patient is unable to cut nails himself since the patient cannot reach his nails.These nails are painful walking and wearing shoes.  This patient presents for at risk foot care today.  General Appearance  Alert, conversant and in no acute stress.  Vascular  Dorsalis pedis and posterior tibial  pulses are palpable  left foot.  Absent pulses right foot.  Capillary return is within normal limits  bilaterally. Temperature is within normal limits  bilaterally.  Neurologic  Senn-Weinstein monofilament wire test within normal limits  bilaterally. Muscle power within normal limits bilaterally.  Nails Thick disfigured discolored nails with subungual debris  from hallux to fifth toes bilaterally. No evidence of bacterial infection or drainage bilaterally.  Orthopedic  No limitations of motion  feet .  No crepitus or effusions noted.  HAV  B/L.  Skin  normotropic skin with no porokeratosis noted bilaterally.  No signs of infections or ulcers noted.     Onychomycosis  Pain in right toes  Pain in left toes  Consent was obtained for treatment procedures.    IE.   Mechanical debridement of nails 1-5  bilaterally performed with a nail nipper.  Filed with dremel without incident. Concerned about his vascular status and ordering vascular studies for this patient.   Return office visit   3 months                   Told patient to return for periodic foot care and evaluation due to potential at risk complications.   Helane Gunther DPM

## 2022-01-16 ENCOUNTER — Encounter: Payer: Self-pay | Admitting: Hematology and Oncology

## 2022-01-17 ENCOUNTER — Telehealth: Payer: Self-pay | Admitting: Hematology and Oncology

## 2022-01-17 NOTE — Telephone Encounter (Signed)
Called patient to confirm upcoming appointments. Patient notified and mailing patient calendar.

## 2022-01-18 ENCOUNTER — Other Ambulatory Visit: Payer: Self-pay

## 2022-01-18 MED ORDER — SENNOSIDES-DOCUSATE SODIUM 8.6-50 MG PO TABS: 2.0000 | ORAL_TABLET | Freq: Two times a day (BID) | ORAL | 3 refills | Status: DC

## 2022-01-18 MED ORDER — FOLIC ACID 1 MG PO TABS: 1.0000 mg | ORAL_TABLET | Freq: Every day | ORAL | 3 refills | Status: DC

## 2022-01-20 ENCOUNTER — Ambulatory Visit: Payer: Medicare HMO | Admitting: Hematology and Oncology

## 2022-01-20 ENCOUNTER — Ambulatory Visit: Payer: Medicare HMO

## 2022-01-20 ENCOUNTER — Other Ambulatory Visit: Payer: Medicare HMO

## 2022-01-23 NOTE — Progress Notes (Signed)
i    [facility]  [phone] [phone]) [phone]   Name: Terry Page. Date: 01/23/2022 MRN: [id number]  DOB: [birth year]  Patient Care Team: [name], MD as PCP - General (Family Medicine)   I connected with [name]. on 01/23/22 at  2:30 PM EST by phone and verified that I am speaking with the correct person using two identifiers.   I discussed the limitations, risks, security and privacy concerns of performing an evaluation and management service by telemedicine and the availability of in-person appointments. I also discussed with the patient that there may be a patient responsible charge related to this service. The patient expressed understanding and agreed to proceed.   Other persons participating in the visit and their role in the encounter:    Patient's location: Home Provider's location: [facility]    INTERVAL HISTORY: Terry Page. is a 67 y.o. 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  Palliative ask to see for symptom management.  SOCIAL HISTORY:     reports that Terry Page quit smoking about 2 years ago. His smoking use included cigarettes. Terry Page has never used smokeless tobacco. Terry Page reports that Terry Page does not currently use alcohol after a past usage of about 12.0 standard drinks of alcohol per week. Terry Page reports that Terry Page does not use drugs.  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 Gery Pray   Incidental lung nodule, greater than or equal to 23mm 10/22/2017   Left upper lobe - discovered on CTA    Prostate cancer ([place])    Spontaneous pneumothorax 10/20/2017   right   Tobacco abuse     ALLERGIES:  has No Known Allergies.  MEDICATIONS:  Current Outpatient Medications  Medication Sig Dispense Refill   acetaminophen (TYLENOL) 325 MG tablet Take 2 tablets (650 mg total) by mouth every 6 (six) hours as needed for mild pain or headache (fever >/= 101).     albuterol (VENTOLIN HFA) 108 (90 Base) MCG/ACT inhaler Inhale 2 puffs into the lungs every 6 (six) hours as needed for wheezing or shortness of breath. 8 g 1   amLODipine (NORVASC) 10 MG tablet Take 1 tablet by mouth daily.     atorvastatin (LIPITOR) 20 MG tablet Take 20 mg by mouth daily.     cyanocobalamin (VITAMIN B12) 1000 MCG tablet Take 1 tablet (1,000 mcg total) by mouth daily. 90 tablet 1   docusate sodium (COLACE) 100 MG capsule Take 100 mg by mouth 2 (two) times daily.     folic acid (FOLVITE) 1 MG tablet Take 1 tablet (1 mg total) by mouth daily. 30 tablet 3   hydrOXYzine (ATARAX) 25 MG tablet Take 1 tablet (25 mg total) by mouth at bedtime as needed for itching. 30 tablet 11   ipratropium-albuterol (DUONEB) 0.5-2.5 (3) MG/3ML SOLN Take 3 mLs by nebulization every 6 (six) hours as needed. (Patient taking differently: Take 3 mLs by nebulization every 6 (six) hours as needed (shortness of breathing or wheezing).) 360 mL 0   lidocaine (LIDODERM) 5 % Place  1 patch onto the skin daily. Remove & Discard patch within 12 hours or as directed by MD. Apply to low back 30 patch 0   lidocaine-prilocaine (EMLA) cream Apply 1 Application topically as needed. 30 g 0   losartan (COZAAR) 100 MG tablet Take 100 mg by mouth daily.     morphine (MS CONTIN) 15 MG 12 hr tablet Take 1 tablet (15 mg total) by mouth every 12 (twelve) hours. (Patient not taking: Reported on 01/06/2022) 60 tablet 0   morphine (MSIR) 15 MG tablet Take 1 tablet (15 mg total) by mouth every 6 (six) hours as needed for severe pain. 60 tablet 0   olmesartan (BENICAR) 40 MG  tablet Take 40 mg by mouth daily.     ondansetron (ZOFRAN) 8 MG tablet Take 1 tablet (8 mg total) by mouth every 8 (eight) hours as needed. 30 tablet 0   polyethylene glycol (MIRALAX) 17 g packet Take 17 g by mouth daily. 30 each 2   potassium chloride SA (KLOR-CON M) 20 MEQ tablet TAKE 1 TABLET(20 MEQ) BY MOUTH DAILY 90 tablet 0   prochlorperazine (COMPAZINE) 10 MG tablet Take 1 tablet (10 mg total) by mouth every 6 (six) hours as needed for nausea or vomiting. 30 tablet 0   senna-docusate (STIMULANT LAXATIVE) 8.6-50 MG tablet Take 2 tablets by mouth 2 (two) times daily. May add additional tablet as needed 150 tablet 3   thiamine 100 MG tablet Take 1 tablet (100 mg total) by mouth daily.     No current facility-administered medications for this visit.    VITAL SIGNS: There were no vitals taken for this visit. There were no vitals filed for this visit.  Estimated body mass index is 21.77 kg/m as calculated from the following:   Height as of 11/16/21: 5\' 8"  (1.727 m).   Weight as of 01/06/22: 143 lb 3.2 oz (65 kg).   PERFORMANCE STATUS (ECOG) : 1 - Symptomatic but completely ambulatory   IMPRESSION:  I connected with Terry Page by phone for symptom follow-up. Overall Terry Page is taking things one day at a time. Terry Page denies nausea, vomiting, constipation, or diarrhea. States Terry Page is trying to remain as active as possible however Terry Page is starting to experience increased pain.    Neoplasm related pain  Terry Page shares pain was previously controlled however over the past several weeks Terry Page has noticed an increase in shoulder pain. Pain is a throb. Sharp stabbing at times causing him to limit activity and focus on breathing and bare through it. Terry Page was previously taking MS IR and MS Contin which unfortunately began causing increased itching and irritation. Terry Page was able to wean off medication for a period of time. Terry Page has been taking Tylenol for his pain with no relief. Education provided on use of oxycodone for  pain as needed. Education provided on potential side effects. Terry Page knows to contact office or seek emergency assistance if Terry Page develops severe side effects. Terry Page was on Oxycodone in the past and tolerated well. Terry Page is hopeful this round will provide some relief and tolerance.   We will continue to closely monitor and adjust as needed.   Constipation   Controlled with daily Miralax and Senna.   PLAN: Oxycodone 5mg  every 6 hours as needed Miralax/Senna daily  I will plan to see patient back in 3-4 weeks in collaboration with his other oncology appointments. Will give him a call next week for close symptom follow-up.    Patient expressed understanding and was  in agreement with this plan. Terry Page also understands that Terry Page can call the clinic at any time with any questions, concerns, or complaints.     Any controlled substances utilized were prescribed in the context of palliative care. PDMP has been reviewed.    Time Total: 45 min   Visit consisted of counseling and education dealing with the complex and emotionally intense issues of symptom management and palliative care in the setting of serious and potentially life-threatening illness.Greater than 50%  of this time was spent counseling and coordinating care related to the above assessment and plan.  [name], AGPCNP-BC  Palliative Medicine Team/[facility]

## 2022-01-24 ENCOUNTER — Inpatient Hospital Stay (HOSPITAL_BASED_OUTPATIENT_CLINIC_OR_DEPARTMENT_OTHER): Payer: Medicare HMO | Admitting: Nurse Practitioner

## 2022-01-24 DIAGNOSIS — G893 Neoplasm related pain (acute) (chronic): Secondary | ICD-10-CM | POA: Diagnosis not present

## 2022-01-24 DIAGNOSIS — K5900 Constipation, unspecified: Secondary | ICD-10-CM | POA: Diagnosis not present

## 2022-01-24 DIAGNOSIS — C3490 Malignant neoplasm of unspecified part of unspecified bronchus or lung: Secondary | ICD-10-CM | POA: Diagnosis not present

## 2022-01-24 DIAGNOSIS — Z515 Encounter for palliative care: Secondary | ICD-10-CM | POA: Diagnosis not present

## 2022-01-24 DIAGNOSIS — C7951 Secondary malignant neoplasm of bone: Secondary | ICD-10-CM

## 2022-01-24 MED ORDER — OXYCODONE HCL 5 MG PO TABS: 5.0000 mg | ORAL_TABLET | Freq: Four times a day (QID) | ORAL | 0 refills | Status: DC | PRN

## 2022-01-26 ENCOUNTER — Ambulatory Visit (HOSPITAL_COMMUNITY)
Admission: RE | Admit: 2022-01-26 | Discharge: 2022-01-26 | Disposition: A | Discharge status: Home / Self Care | Payer: Medicare HMO | Source: Ambulatory Visit | Attending: Podiatry | Admitting: Podiatry

## 2022-01-26 DIAGNOSIS — R0989 Other specified symptoms and signs involving the circulatory and respiratory systems: Secondary | ICD-10-CM | POA: Insufficient documentation

## 2022-01-26 DIAGNOSIS — I739 Peripheral vascular disease, unspecified: Secondary | ICD-10-CM | POA: Diagnosis not present

## 2022-01-26 LAB — VAS US ABI WITH/WO TBI
Left ABI: 0.84
Right ABI: 0.78

## 2022-01-27 ENCOUNTER — Inpatient Hospital Stay: Payer: Medicare HMO

## 2022-01-27 ENCOUNTER — Other Ambulatory Visit: Payer: Self-pay

## 2022-01-27 VITALS — BP 97/64 | HR 96 | Temp 98.0°F | Resp 98 | Wt 151.0 lb

## 2022-01-27 DIAGNOSIS — C3412 Malignant neoplasm of upper lobe, left bronchus or lung: Secondary | ICD-10-CM

## 2022-01-27 DIAGNOSIS — Z95828 Presence of other vascular implants and grafts: Secondary | ICD-10-CM

## 2022-01-27 DIAGNOSIS — Z5112 Encounter for antineoplastic immunotherapy: Secondary | ICD-10-CM | POA: Diagnosis not present

## 2022-01-27 LAB — CBC WITH DIFFERENTIAL (CANCER CENTER ONLY)
Abs Immature Granulocytes: 0.41 10*3/uL — ABNORMAL HIGH (ref 0.00–0.07)
Basophils Absolute: 0.1 10*3/uL (ref 0.0–0.1)
Basophils Relative: 0 %
Eosinophils Absolute: 0.6 10*3/uL — ABNORMAL HIGH (ref 0.0–0.5)
Eosinophils Relative: 4 %
HCT: 24.1 % — ABNORMAL LOW (ref 39.0–52.0)
Hemoglobin: 8.2 g/dL — ABNORMAL LOW (ref 13.0–17.0)
Immature Granulocytes: 3 %
Lymphocytes Relative: 8 %
Lymphs Abs: 1.3 10*3/uL (ref 0.7–4.0)
MCH: 32.3 pg (ref 26.0–34.0)
MCHC: 34 g/dL (ref 30.0–36.0)
MCV: 94.9 fL (ref 80.0–100.0)
Monocytes Absolute: 2.2 10*3/uL — ABNORMAL HIGH (ref 0.1–1.0)
Monocytes Relative: 14 %
Neutro Abs: 10.7 10*3/uL — ABNORMAL HIGH (ref 1.7–7.7)
Neutrophils Relative %: 71 %
Platelet Count: 482 10*3/uL — ABNORMAL HIGH (ref 150–400)
RBC: 2.54 MIL/uL — ABNORMAL LOW (ref 4.22–5.81)
RDW: 19.4 % — ABNORMAL HIGH (ref 11.5–15.5)
WBC Count: 15.3 10*3/uL — ABNORMAL HIGH (ref 4.0–10.5)
nRBC: 0 % (ref 0.0–0.2)

## 2022-01-27 LAB — CMP (CANCER CENTER ONLY)
ALT: 28 U/L (ref 0–44)
AST: 30 U/L (ref 15–41)
Albumin: 3.3 g/dL — ABNORMAL LOW (ref 3.5–5.0)
Alkaline Phosphatase: 89 U/L (ref 38–126)
Anion gap: 6 (ref 5–15)
BUN: 18 mg/dL (ref 8–23)
CO2: 23 mmol/L (ref 22–32)
Calcium: 9.2 mg/dL (ref 8.9–10.3)
Chloride: 104 mmol/L (ref 98–111)
Creatinine: 0.97 mg/dL (ref 0.61–1.24)
GFR, Estimated: >60 mL/min (ref >60)
Glucose, Bld: 83 mg/dL (ref 70–99)
Potassium: 4.4 mmol/L (ref 3.5–5.1)
Sodium: 133 mmol/L — ABNORMAL LOW (ref 135–145)
Total Bilirubin: 0.3 mg/dL (ref 0.3–1.2)
Total Protein: 8.1 g/dL (ref 6.5–8.1)

## 2022-01-27 LAB — TSH: TSH: 1.549 u[IU]/mL (ref 0.350–4.500)

## 2022-01-27 MED ORDER — SODIUM CHLORIDE 0.9 % IV SOLN
200.0000 mg | Freq: Once | INTRAVENOUS | Status: AC
  Administered 2022-01-27: 200 mg via INTRAVENOUS
  Filled 2022-01-27: qty 8

## 2022-01-27 MED ORDER — SODIUM CHLORIDE 0.9% FLUSH
10.0000 mL | INTRAVENOUS | Status: DC | PRN
  Administered 2022-01-27: 10 mL

## 2022-01-27 MED ORDER — PROCHLORPERAZINE MALEATE 10 MG PO TABS
10.0000 mg | ORAL_TABLET | Freq: Once | ORAL | Status: AC
  Administered 2022-01-27: 10 mg via ORAL
  Filled 2022-01-27: qty 1

## 2022-01-27 MED ORDER — SODIUM CHLORIDE 0.9% FLUSH
10.0000 mL | Freq: Once | INTRAVENOUS | Status: AC
  Administered 2022-01-27: 10 mL

## 2022-01-27 MED ORDER — SODIUM CHLORIDE 0.9 % IV SOLN: Freq: Once | INTRAVENOUS | Status: AC

## 2022-01-27 MED ORDER — CYANOCOBALAMIN 1000 MCG/ML IJ SOLN
1000.0000 ug | Freq: Once | INTRAMUSCULAR | Status: AC
  Administered 2022-01-27: 1000 ug via INTRAMUSCULAR
  Filled 2022-01-27: qty 1

## 2022-01-27 MED ORDER — SODIUM CHLORIDE 0.9 % IV SOLN
500.0000 mg/m2 | Freq: Once | INTRAVENOUS | Status: AC
  Administered 2022-01-27: 900 mg via INTRAVENOUS
  Filled 2022-01-27: qty 20

## 2022-01-27 MED ORDER — HEPARIN SOD (PORK) LOCK FLUSH 100 UNIT/ML IV SOLN
500.0000 [IU] | Freq: Once | INTRAVENOUS | Status: AC | PRN
  Administered 2022-01-27: 500 [IU]

## 2022-01-27 NOTE — Patient Instructions (Signed)
[facility]  Discharge Instructions: Thank you for choosing [facility] to provide your oncology and hematology care.   If you have a lab appointment with the [facility], please go directly to the [facility] and check in at the registration area.   Wear comfortable clothing and clothing appropriate for easy access to any Portacath or PICC line.   We strive to give you quality time with your provider. You may need to reschedule your appointment if you arrive late (15 or more minutes).  Arriving late affects you and other patients whose appointments are after yours.  Also, if you miss three or more appointments without notifying the office, you may be dismissed from the clinic at the provider's discretion.      For prescription refill requests, have your pharmacy contact our office and allow 72 hours for refills to be completed.    Today you received the following chemotherapy and/or immunotherapy agents : Keytruda, Alimta      To help prevent nausea and vomiting after your treatment, we encourage you to take your nausea medication as directed.  BELOW ARE SYMPTOMS THAT SHOULD BE REPORTED IMMEDIATELY: *FEVER GREATER THAN 100.4 F (38 C) OR HIGHER *CHILLS OR SWEATING *NAUSEA AND VOMITING THAT IS NOT CONTROLLED WITH YOUR NAUSEA MEDICATION *UNUSUAL SHORTNESS OF BREATH *UNUSUAL BRUISING OR BLEEDING *URINARY PROBLEMS (pain or burning when urinating, or frequent urination) *BOWEL PROBLEMS (unusual diarrhea, constipation, pain near the anus) TENDERNESS IN MOUTH AND THROAT WITH OR WITHOUT PRESENCE OF ULCERS (sore throat, sores in mouth, or a toothache) UNUSUAL RASH, SWELLING OR PAIN  UNUSUAL VAGINAL DISCHARGE OR ITCHING   Items with * indicate a potential emergency and should be followed up as soon as possible or go to the Emergency Department if any problems should occur.  Please show the CHEMOTHERAPY ALERT CARD or IMMUNOTHERAPY ALERT CARD  at check-in to the Emergency Department and triage nurse.  Should you have questions after your visit or need to cancel or reschedule your appointment, please contact [facility]  Dept: [phone]  and follow the prompts.  Office hours are 8:00 a.m. to 4:30 p.m. Monday - Friday. Please note that voicemails left after 4:00 p.m. may not be returned until the following business day.  We are closed weekends and major holidays. You have access to a nurse at all times for urgent questions. Please call the main number to the clinic Dept: [phone] and follow the prompts.   For any non-urgent questions, you may also contact your provider using MyChart. We now offer e-Visits for anyone 46 and older to request care online for non-urgent symptoms. For details visit [email or web address].   Also download the MyChart app! Go to the app store, search "MyChart", open the app, select [facility], and log in with your MyChart username and password.

## 2022-01-30 ENCOUNTER — Telehealth: Payer: Self-pay | Admitting: *Deleted

## 2022-01-30 NOTE — Telephone Encounter (Signed)
error 

## 2022-01-30 NOTE — Telephone Encounter (Signed)
Patient is calling for results of his vascular study completed, ready to view in epic.Please advise.

## 2022-01-31 LAB — T4: T4, Total: 7.2 ug/dL (ref 4.5–12.0)

## 2022-01-31 NOTE — Telephone Encounter (Signed)
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 He verbalized understanding.

## 2022-02-10 ENCOUNTER — Ambulatory Visit: Payer: Medicare HMO | Admitting: Hematology and Oncology

## 2022-02-10 ENCOUNTER — Ambulatory Visit: Payer: Medicare HMO

## 2022-02-10 ENCOUNTER — Other Ambulatory Visit: Payer: Medicare HMO

## 2022-02-16 NOTE — Progress Notes (Signed)
i    [facility]  [phone]) [phone] [phone]) [phone]   Name: Terry Page. Date: 02/16/2022 MRN: [id number]  DOB: June 25, 1955  Patient Care Team: [name], MD as PCP - General (Family Medicine)     INTERVAL HISTORY: Terry Page. is a 67 y.o. 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  Palliative ask to see for symptom management.  SOCIAL HISTORY:     reports that he quit smoking about 2 years ago. His smoking use included cigarettes. He has never used smokeless tobacco. He reports that he does not currently use alcohol after a past usage of about 12.0 standard drinks of alcohol per week. He reports that he does not use drugs.  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 Gery Pray   Incidental lung nodule, greater than or equal to 17mm 10/22/2017   Left upper lobe - discovered on CTA   Prostate cancer ([place])    Spontaneous pneumothorax 10/20/2017   right   Tobacco abuse     ALLERGIES:  has No Known Allergies.  MEDICATIONS:  Current Outpatient Medications  Medication Sig Dispense Refill   acetaminophen (TYLENOL) 325 MG tablet Take 2 tablets (650 mg total) by mouth every 6 (six) hours as needed for mild pain or headache (fever >/= 101).     albuterol (VENTOLIN HFA) 108 (90 Base) MCG/ACT inhaler Inhale 2 puffs into the lungs every 6 (six) hours as needed for wheezing or shortness of breath. 8 g 1   amLODipine (NORVASC) 10 MG tablet Take 1 tablet by mouth daily.     atorvastatin (LIPITOR) 20 MG tablet Take 20 mg by mouth  daily.     cyanocobalamin (VITAMIN B12) 1000 MCG tablet Take 1 tablet (1,000 mcg total) by mouth daily. 90 tablet 1   docusate sodium (COLACE) 100 MG capsule Take 100 mg by mouth 2 (two) times daily.     folic acid (FOLVITE) 1 MG tablet Take 1 tablet (1 mg total) by mouth daily. 30 tablet 3   hydrOXYzine (ATARAX) 25 MG tablet Take 1 tablet (25 mg total) by mouth at bedtime as needed for itching. 30 tablet 11   ipratropium-albuterol (DUONEB) 0.5-2.5 (3) MG/3ML SOLN Take 3 mLs by nebulization every 6 (six) hours as needed. (Patient taking differently: Take 3 mLs by nebulization every 6 (six) hours as needed (shortness of breathing or wheezing).) 360 mL 0   lidocaine (LIDODERM) 5 % Place 1 patch onto the skin daily. Remove & Discard patch within 12 hours or as directed by MD. Apply to low back 30 patch 0   lidocaine-prilocaine (EMLA) cream Apply 1 Application topically as needed. 30 g 0   losartan (COZAAR) 100 MG tablet Take 100 mg by mouth daily.     olmesartan (BENICAR) 40 MG tablet Take 40 mg by mouth daily.     ondansetron (ZOFRAN) 8 MG tablet Take 1 tablet (8 mg total) by mouth every 8 (eight) hours as needed. 30 tablet 0   oxyCODONE (OXY IR/ROXICODONE) 5 MG immediate release tablet Take 1 tablet (5  mg total) by mouth every 6 (six) hours as needed for severe pain. 60 tablet 0   polyethylene glycol (MIRALAX) 17 g packet Take 17 g by mouth daily. 30 each 2   potassium chloride SA (KLOR-CON M) 20 MEQ tablet TAKE 1 TABLET(20 MEQ) BY MOUTH DAILY 90 tablet 0   prochlorperazine (COMPAZINE) 10 MG tablet Take 1 tablet (10 mg total) by mouth every 6 (six) hours as needed for nausea or vomiting. 30 tablet 0   senna-docusate (STIMULANT LAXATIVE) 8.6-50 MG tablet Take 2 tablets by mouth 2 (two) times daily. May add additional tablet as needed 150 tablet 3   thiamine 100 MG tablet Take 1 tablet (100 mg total) by mouth daily.     No current facility-administered medications for this visit.    VITAL  SIGNS: There were no vitals taken for this visit. There were no vitals filed for this visit.  Estimated body mass index is 22.96 kg/m as calculated from the following:   Height as of 11/16/21: 5\' 8"  (1.727 m).   Weight as of 01/27/22: 151 lb (68.5 kg).   PERFORMANCE STATUS (ECOG) : 1 - Symptomatic but completely ambulatory  Assessment NAD, sitting in recliner Normal breathing pattern AAO x3  IMPRESSION:  I saw Terry Page during his infusion. No acute distress noted. Is trying to remain as active as possible. Is walking with a cane. Overall feels he is doing ok but is endorsing bilateral shoulder pain. Appetite is good. Denies nausea, vomiting, constipation, or diarrhea.   Neoplasm related pain  Terry Page reports pain in his back and both shoulders. Feels pain is more significant in his left shoulder and radiates to right shoulder. We discussed proper use of cane and shifting of weight. Nothing makes pain better. He has been trying heating pad and ES Tylenol. Unfortunately he was unable to tolerate morphine or oxycodone due to significant itching. He has atarax on hand however this provided minimal relief. We have discontinued use of both.   Education provided on the use of tramadol. On chart reviewed he was prescribed tramadol in 2019. No history of reaction. Patient does not recall intolerance. Agreeable to try. Education provided on use and potential side effects. He verbalized understanding.   We will continue to closely monitor and adjust as needed.   Constipation   Controlled with daily Miralax and Senna.   PLAN: Tramadol 50-100mg  every 6 hours as needed Miralax/Senna daily  I will plan to see patient back in 3-4 weeks in collaboration with his other oncology appointments. Will give him a call next week for close symptom follow-up.    Patient expressed understanding and was in agreement with this plan. He also understands that He can call the clinic at any time with any  questions, concerns, or complaints.     Any controlled substances utilized were prescribed in the context of palliative care. PDMP has been reviewed.    Time Total: 30 min   Visit consisted of counseling and education dealing with the complex and emotionally intense issues of symptom management and palliative care in the setting of serious and potentially life-threatening illness.Greater than 50%  of this time was spent counseling and coordinating care related to the above assessment and plan.  Alda Lea, AGPCNP-BC  Palliative Medicine Team/ [facility]

## 2022-02-17 ENCOUNTER — Other Ambulatory Visit: Payer: Medicare HMO

## 2022-02-17 ENCOUNTER — Inpatient Hospital Stay (HOSPITAL_BASED_OUTPATIENT_CLINIC_OR_DEPARTMENT_OTHER): Payer: Medicare HMO | Admitting: Nurse Practitioner

## 2022-02-17 ENCOUNTER — Inpatient Hospital Stay: Payer: Medicare HMO | Attending: Hematology and Oncology | Admitting: Hematology and Oncology

## 2022-02-17 ENCOUNTER — Other Ambulatory Visit: Payer: Self-pay

## 2022-02-17 ENCOUNTER — Inpatient Hospital Stay: Payer: Medicare HMO

## 2022-02-17 ENCOUNTER — Encounter: Payer: Self-pay | Admitting: Nurse Practitioner

## 2022-02-17 VITALS — HR 100

## 2022-02-17 VITALS — BP 125/80 | HR 102 | Temp 97.9°F | Resp 16 | Wt 152.3 lb

## 2022-02-17 DIAGNOSIS — Z79620 Long term (current) use of immunosuppressive biologic: Secondary | ICD-10-CM | POA: Insufficient documentation

## 2022-02-17 DIAGNOSIS — C3412 Malignant neoplasm of upper lobe, left bronchus or lung: Secondary | ICD-10-CM

## 2022-02-17 DIAGNOSIS — Z79891 Long term (current) use of opiate analgesic: Secondary | ICD-10-CM | POA: Diagnosis not present

## 2022-02-17 DIAGNOSIS — Z95828 Presence of other vascular implants and grafts: Secondary | ICD-10-CM | POA: Diagnosis not present

## 2022-02-17 DIAGNOSIS — R530 Neoplastic (malignant) related fatigue: Secondary | ICD-10-CM | POA: Diagnosis not present

## 2022-02-17 DIAGNOSIS — Z8546 Personal history of malignant neoplasm of prostate: Secondary | ICD-10-CM | POA: Insufficient documentation

## 2022-02-17 DIAGNOSIS — G893 Neoplasm related pain (acute) (chronic): Secondary | ICD-10-CM

## 2022-02-17 DIAGNOSIS — Z5111 Encounter for antineoplastic chemotherapy: Secondary | ICD-10-CM | POA: Diagnosis present

## 2022-02-17 DIAGNOSIS — Z809 Family history of malignant neoplasm, unspecified: Secondary | ICD-10-CM | POA: Insufficient documentation

## 2022-02-17 DIAGNOSIS — C7951 Secondary malignant neoplasm of bone: Secondary | ICD-10-CM | POA: Insufficient documentation

## 2022-02-17 DIAGNOSIS — Z79631 Long term (current) use of antimetabolite agent: Secondary | ICD-10-CM | POA: Diagnosis not present

## 2022-02-17 DIAGNOSIS — K5900 Constipation, unspecified: Secondary | ICD-10-CM | POA: Insufficient documentation

## 2022-02-17 DIAGNOSIS — I10 Essential (primary) hypertension: Secondary | ICD-10-CM | POA: Insufficient documentation

## 2022-02-17 DIAGNOSIS — C3490 Malignant neoplasm of unspecified part of unspecified bronchus or lung: Secondary | ICD-10-CM | POA: Diagnosis not present

## 2022-02-17 DIAGNOSIS — T451X5A Adverse effect of antineoplastic and immunosuppressive drugs, initial encounter: Secondary | ICD-10-CM | POA: Diagnosis not present

## 2022-02-17 DIAGNOSIS — Z515 Encounter for palliative care: Secondary | ICD-10-CM | POA: Insufficient documentation

## 2022-02-17 DIAGNOSIS — Z87891 Personal history of nicotine dependence: Secondary | ICD-10-CM | POA: Insufficient documentation

## 2022-02-17 DIAGNOSIS — Z79899 Other long term (current) drug therapy: Secondary | ICD-10-CM | POA: Insufficient documentation

## 2022-02-17 DIAGNOSIS — Z923 Personal history of irradiation: Secondary | ICD-10-CM | POA: Insufficient documentation

## 2022-02-17 DIAGNOSIS — D649 Anemia, unspecified: Secondary | ICD-10-CM

## 2022-02-17 DIAGNOSIS — D6481 Anemia due to antineoplastic chemotherapy: Secondary | ICD-10-CM | POA: Insufficient documentation

## 2022-02-17 DIAGNOSIS — Z5112 Encounter for antineoplastic immunotherapy: Secondary | ICD-10-CM | POA: Diagnosis present

## 2022-02-17 LAB — CBC WITH DIFFERENTIAL (CANCER CENTER ONLY)
Abs Immature Granulocytes: 0.27 10*3/uL — ABNORMAL HIGH (ref 0.00–0.07)
Basophils Absolute: 0.1 10*3/uL (ref 0.0–0.1)
Basophils Relative: 1 %
Eosinophils Absolute: 0.7 10*3/uL — ABNORMAL HIGH (ref 0.0–0.5)
Eosinophils Relative: 5 %
HCT: 23.7 % — ABNORMAL LOW (ref 39.0–52.0)
Hemoglobin: 8.1 g/dL — ABNORMAL LOW (ref 13.0–17.0)
Immature Granulocytes: 2 %
Lymphocytes Relative: 9 %
Lymphs Abs: 1.3 10*3/uL (ref 0.7–4.0)
MCH: 33.9 pg (ref 26.0–34.0)
MCHC: 34.2 g/dL (ref 30.0–36.0)
MCV: 99.2 fL (ref 80.0–100.0)
Monocytes Absolute: 1.7 10*3/uL — ABNORMAL HIGH (ref 0.1–1.0)
Monocytes Relative: 12 %
Neutro Abs: 10.4 10*3/uL — ABNORMAL HIGH (ref 1.7–7.7)
Neutrophils Relative %: 71 %
Platelet Count: 461 10*3/uL — ABNORMAL HIGH (ref 150–400)
RBC: 2.39 MIL/uL — ABNORMAL LOW (ref 4.22–5.81)
RDW: 21.7 % — ABNORMAL HIGH (ref 11.5–15.5)
WBC Count: 14.4 10*3/uL — ABNORMAL HIGH (ref 4.0–10.5)
nRBC: 0 % (ref 0.0–0.2)

## 2022-02-17 LAB — CMP (CANCER CENTER ONLY)
ALT: 16 U/L (ref 0–44)
AST: 22 U/L (ref 15–41)
Albumin: 3.4 g/dL — ABNORMAL LOW (ref 3.5–5.0)
Alkaline Phosphatase: 82 U/L (ref 38–126)
Anion gap: 8 (ref 5–15)
BUN: 19 mg/dL (ref 8–23)
CO2: 20 mmol/L — ABNORMAL LOW (ref 22–32)
Calcium: 9.1 mg/dL (ref 8.9–10.3)
Chloride: 106 mmol/L (ref 98–111)
Creatinine: 1.04 mg/dL (ref 0.61–1.24)
GFR, Estimated: >60 mL/min (ref >60)
Glucose, Bld: 112 mg/dL — ABNORMAL HIGH (ref 70–99)
Potassium: 4.5 mmol/L (ref 3.5–5.1)
Sodium: 134 mmol/L — ABNORMAL LOW (ref 135–145)
Total Bilirubin: 0.3 mg/dL (ref 0.3–1.2)
Total Protein: 8 g/dL (ref 6.5–8.1)

## 2022-02-17 LAB — FOLATE: Folate: 33.3 ng/mL (ref >5.9)

## 2022-02-17 LAB — TSH: TSH: 1.712 u[IU]/mL (ref 0.350–4.500)

## 2022-02-17 MED ORDER — SODIUM CHLORIDE 0.9 % IV SOLN: Freq: Once | INTRAVENOUS | Status: AC

## 2022-02-17 MED ORDER — HEPARIN SOD (PORK) LOCK FLUSH 100 UNIT/ML IV SOLN
500.0000 [IU] | Freq: Once | INTRAVENOUS | Status: AC | PRN
  Administered 2022-02-17: 500 [IU]

## 2022-02-17 MED ORDER — SODIUM CHLORIDE 0.9% FLUSH
10.0000 mL | INTRAVENOUS | Status: DC | PRN
  Administered 2022-02-17: 10 mL

## 2022-02-17 MED ORDER — SODIUM CHLORIDE 0.9 % IV SOLN
200.0000 mg | Freq: Once | INTRAVENOUS | Status: AC
  Administered 2022-02-17: 200 mg via INTRAVENOUS
  Filled 2022-02-17: qty 8

## 2022-02-17 MED ORDER — TRAMADOL HCL 50 MG PO TABS: 50.0000 mg | ORAL_TABLET | Freq: Four times a day (QID) | ORAL | 0 refills | Status: DC | PRN

## 2022-02-17 MED ORDER — VITAMIN B-12 1000 MCG PO TABS: 1000.0000 ug | ORAL_TABLET | Freq: Every day | ORAL | 1 refills | Status: DC

## 2022-02-17 MED ORDER — SODIUM CHLORIDE 0.9% FLUSH
10.0000 mL | Freq: Once | INTRAVENOUS | Status: AC
  Administered 2022-02-17: 10 mL

## 2022-02-17 MED ORDER — SODIUM CHLORIDE 0.9 % IV SOLN
500.0000 mg/m2 | Freq: Once | INTRAVENOUS | Status: AC
  Administered 2022-02-17: 900 mg via INTRAVENOUS
  Filled 2022-02-17: qty 20

## 2022-02-17 MED ORDER — PROCHLORPERAZINE MALEATE 10 MG PO TABS
10.0000 mg | ORAL_TABLET | Freq: Once | ORAL | Status: AC
  Administered 2022-02-17: 10 mg via ORAL
  Filled 2022-02-17: qty 1

## 2022-02-17 NOTE — Patient Instructions (Signed)
Terry Page  Discharge Instructions: Thank you for choosing [facility] to provide your oncology and hematology care.   If you have a lab appointment with the [facility], please go directly to the [facility] and check in at the registration area.   Wear comfortable clothing and clothing appropriate for easy access to any Portacath or PICC line.   We strive to give you quality time with your provider. You may need to reschedule your appointment if you arrive late (15 or more minutes).  Arriving late affects you and other patients whose appointments are after yours.  Also, if you miss three or more appointments without notifying the office, you may be dismissed from the clinic at the provider's discretion.      For prescription refill requests, have your pharmacy contact our office and allow 72 hours for refills to be completed.    Today you received the following chemotherapy and/or immunotherapy agents : Keytruda, Alimta      To help prevent nausea and vomiting after your treatment, we encourage you to take your nausea medication as directed.  BELOW ARE SYMPTOMS THAT SHOULD BE REPORTED IMMEDIATELY: *FEVER GREATER THAN 100.4 F (38 C) OR HIGHER *CHILLS OR SWEATING *NAUSEA AND VOMITING THAT IS NOT CONTROLLED WITH YOUR NAUSEA MEDICATION *UNUSUAL SHORTNESS OF BREATH *UNUSUAL BRUISING OR BLEEDING *URINARY PROBLEMS (pain or burning when urinating, or frequent urination) *BOWEL PROBLEMS (unusual diarrhea, constipation, pain near the anus) TENDERNESS IN MOUTH AND THROAT WITH OR WITHOUT PRESENCE OF ULCERS (sore throat, sores in mouth, or a toothache) UNUSUAL RASH, SWELLING OR PAIN  UNUSUAL VAGINAL DISCHARGE OR ITCHING   Items with * indicate a potential emergency and should be followed up as soon as possible or go to the Emergency Department if any problems should occur.  Please show the CHEMOTHERAPY ALERT CARD or IMMUNOTHERAPY ALERT CARD  at check-in to the Emergency Department and triage nurse.  Should you have questions after your visit or need to cancel or reschedule your appointment, please contact [facility]  Dept: [phone]  and follow the prompts.  Office hours are 8:00 a.m. to 4:30 p.m. Monday - Friday. Please note that voicemails left after 4:00 p.m. may not be returned until the following business day.  We are closed weekends and major holidays. You have access to a nurse at all times for urgent questions. Please call the main number to the clinic Dept: [phone] and follow the prompts.   For any non-urgent questions, you may also contact your provider using MyChart. We now offer e-Visits for anyone 38 and older to request care online for non-urgent symptoms. For details visit [email or web address].   Also download the MyChart app! Go to the app store, search "MyChart", open the app, select [facility], and log in with your MyChart username and password.

## 2022-02-17 NOTE — Progress Notes (Signed)
[place] [phone]   Fax:([phone]  PROGRESS NOTE  Patient Care Team: [name], MD as PCP - General (Family Medicine)  Hematological/Oncological History # Metastatic Adenocarcinoma of the Lung  01/07/2020 : CT of the chest with contrast performed which showed interval enlargement of a spiculated nodule in the central left upper lobe measuring 1.2 x 1.0 cm and highly suspicious for primary lung malignancy 02/06/2020 :PET scan performed which showed a hypermetabolic irregular solid 1.3 cm left upper lobe pulmonary nodule compatible with malignancy without any other hypermetabolic lesions 07/07/2829- 04/19/2020:  SBRT to the lung lesion 04/28/2021: CT C/A/P showed multifocal lytic bone metastases are identified. Lesion within the spine of the left scapula and lesions involving bilateral iliac bones and left sacral wing. Establish care with Dr. Lorenso Courier  05/03/2021: biopsy of right iliac lytic lesion showed metastatic carcinoma, consistent with lung primary.  07/08/2021: Cycle 1 Day 1 of Carbo/Pem/Pem 07/29/2021: Cycle 2 Day 1 of Carbo/Pem/Pem 08/11/2021-08/24/2021: Received palliative radiation to osseous metastasis in the left shoulder. 30 Gy in 10 fractions.  08/19/2021: Cycle 3 Day 1 of Carbo/Pem/Pem 09/09/2021: Cycle 4 Day 1 of Carbo/Pem/Pem 09/23/2021: CT CAP: stable disease 09/30/2021: Cycle 5 Day 1 of Pem/Pem 10/21/2021: Cycle 6 Day 1 of Pembrolizumab. Held pemetrexed due to anemia. 11/16/2021: Cycle 7 Day 1 of Pem/Pem.   12/16/2021: Cycle 8 Day 1 of Pem/Pem.   01/06/2022: Cycle 9 Day 1 of Pem/Pem.  01/27/2022: Cycle 10 Day 1 of Pem/Pem 02/17/2022: Cycle 11 Day 1 of Pem/Pem  #Adenocarcinoma of the Prostate, [id number] 08/19/2019-10/07/2019: 70 Gy in 28 fractions of 2.5 Gy.  Radiation to the prostate was under the care of Dr. Tyler Pita  Interval History:  Terry Page. 67 y.o. male with medical history significant for metastatic adenocarcinoma of the  lung who presents for a follow up visit. The patient's last visit was on 01/06/2022. In the interim since the last visit he has completed Cycle 10 of chemotherapy.  On exam today Mr. Salway reports the last cycle of chemotherapy went well.  He reports that over the last 2 cycles the pain has gone predominantly from his left shoulder and moved to his right shoulder.  He reports that there is a "dull throb".  He notes he is currently taking Tylenol 500 mg p.o. up to every 6 hours for his pain.  He had to stop his oxycodone because was causing itching, he reports he was having whole body itching.  He reports that he does sweat sometimes at night but otherwise denies any lightheadedness, dizziness or shortness of breath.  He has a good appetite and enjoys eating red meat.  He notes that otherwise he is at his baseline level of health and willing and able to proceed with chemotherapy at this time.   He denies any fevers, chills, night sweats, shortness of breath, chest pain, cough, peripheral edema or neuropathy.  He has no other complaints.A full 10 point ROS is listed below.  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 Gery Pray   Incidental lung nodule, greater than or equal to 51mm 10/22/2017   Left upper lobe - discovered on CTA   Prostate cancer ([place])    Spontaneous pneumothorax 10/20/2017   right   Tobacco abuse  SURGICAL HISTORY: Past Surgical History:  Procedure Laterality Date   BACK SURGERY     IR IMAGING GUIDED PORT INSERTION  06/29/2021   PROSTATE BIOPSY      SOCIAL HISTORY: Social History   Socioeconomic History   Marital status: Single    Spouse name: Not on file   Number of children: Not on file   Years of education: Not on file   Highest education level: Not on file  Occupational  History   Occupation: retired  Tobacco Use   Smoking status: Former    Types: Cigarettes    Quit date: 11/22/2019    Years since quitting: 2.2   Smokeless tobacco: Never   Tobacco comments:    Patient reports quit 3 years ago. 06/25/20. [facility]  Vaping Use   Vaping Use: Never used  Substance and Sexual Activity   Alcohol use: Not Currently    Alcohol/week: 12.0 standard drinks of alcohol    Types: 12 Cans of beer per week    Comment: daily   Drug use: No   Sexual activity: Not Currently  Other Topics Concern   Not on file  Social History Narrative   Not on file   Social Determinants of Health   Financial Resource Strain: Not on file  Food Insecurity: Not on file  Transportation Needs: Unmet Transportation Needs (07/18/2021)   PRAPARE - Hydrologist (Medical): Yes    Lack of Transportation (Non-Medical): Yes  Physical Activity: Not on file  Stress: Not on file  Social Connections: Not on file  Intimate Partner Violence: Not on file    FAMILY HISTORY: Family History  Problem Relation Age of Onset   Cancer Cousin        maternal cousin   Cancer Cousin        paternal cousin   Cancer Cousin    Colon polyps Neg Hx    Pancreatic disease Neg Hx    Pancreatic cancer Neg Hx    Breast cancer Neg Hx    Colon cancer Neg Hx     ALLERGIES:  has No Known Allergies.  MEDICATIONS:  Current Outpatient Medications  Medication Sig Dispense Refill   acetaminophen (TYLENOL) 325 MG tablet Take 2 tablets (650 mg total) by mouth every 6 (six) hours as needed for mild pain or headache (fever >/= 101).     albuterol (VENTOLIN HFA) 108 (90 Base) MCG/ACT inhaler Inhale 2 puffs into the lungs every 6 (six) hours as needed for wheezing or shortness of breath. 8 g 1   amLODipine (NORVASC) 10 MG tablet Take 1 tablet by mouth daily.     atorvastatin (LIPITOR) 20 MG tablet Take 20 mg by mouth daily.     folic acid (FOLVITE) 1 MG tablet Take 1 tablet (1 mg total) by  mouth daily. 30 tablet 3   hydrOXYzine (ATARAX) 25 MG tablet Take 1 tablet (25 mg total) by mouth at bedtime as needed for itching. 30 tablet 11   ipratropium-albuterol (DUONEB) 0.5-2.5 (3) MG/3ML SOLN Take 3 mLs by nebulization every 6 (six) hours as needed. (Patient taking differently: Take 3 mLs by nebulization every 6 (six) hours as needed (shortness of breathing or wheezing).) 360 mL 0   lidocaine-prilocaine (EMLA) cream Apply 1 Application topically as needed. 30 g 0   losartan (COZAAR) 100 MG tablet Take 100 mg by mouth daily.     olmesartan (BENICAR) 40 MG tablet Take 40 mg by mouth daily.  ondansetron (ZOFRAN) 8 MG tablet Take 1 tablet (8 mg total) by mouth every 8 (eight) hours as needed. 30 tablet 0   potassium chloride SA (KLOR-CON M) 20 MEQ tablet TAKE 1 TABLET(20 MEQ) BY MOUTH DAILY 90 tablet 0   prochlorperazine (COMPAZINE) 10 MG tablet Take 1 tablet (10 mg total) by mouth every 6 (six) hours as needed for nausea or vomiting. 30 tablet 0   senna-docusate (STIMULANT LAXATIVE) 8.6-50 MG tablet Take 2 tablets by mouth 2 (two) times daily. May add additional tablet as needed 150 tablet 3   thiamine 100 MG tablet Take 1 tablet (100 mg total) by mouth daily.     cyanocobalamin (VITAMIN B12) 1000 MCG tablet Take 1 tablet (1,000 mcg total) by mouth daily. (Patient not taking: Reported on 02/17/2022) 90 tablet 1   docusate sodium (COLACE) 100 MG capsule Take 100 mg by mouth 2 (two) times daily. (Patient not taking: Reported on 02/17/2022)     lidocaine (LIDODERM) 5 % Place 1 patch onto the skin daily. Remove & Discard patch within 12 hours or as directed by MD. Apply to low back (Patient not taking: Reported on 02/17/2022) 30 patch 0   oxyCODONE (OXY IR/ROXICODONE) 5 MG immediate release tablet Take 1 tablet (5 mg total) by mouth every 6 (six) hours as needed for severe pain. (Patient not taking: Reported on 02/17/2022) 60 tablet 0   polyethylene glycol (MIRALAX) 17 g packet Take 17 g by mouth  daily. (Patient not taking: Reported on 02/17/2022) 30 each 2   No current facility-administered medications for this visit.   Facility-Administered Medications Ordered in Other Visits  Medication Dose Route Frequency Provider Last Rate Last Admin   heparin lock flush 100 unit/mL  500 Units Intracatheter Once PRN Orson Slick, MD       pembrolizumab [facility]) 200 mg in sodium chloride 0.9 % 50 mL chemo infusion  200 mg Intravenous Once Orson Slick, MD       PEMEtrexed (ALIMTA) 900 mg in sodium chloride 0.9 % 100 mL chemo infusion  500 mg/m2 (Treatment Plan Recorded) Intravenous Once Orson Slick, MD       sodium chloride flush (NS) 0.9 % injection 10 mL  10 mL Intracatheter PRN Orson Slick, MD        REVIEW OF SYSTEMS:   Constitutional: ( - ) fevers, ( - )  chills , ( - ) night sweats Eyes: ( - ) blurriness of vision, ( - ) double vision, ( - ) watery eyes Ears, nose, mouth, throat, and face: ( - ) mucositis, ( - ) sore throat Respiratory: ( - ) cough, ( - ) dyspnea, ( - ) wheezes Cardiovascular: ( - ) palpitation, ( - ) chest discomfort, ( - ) lower extremity swelling Gastrointestinal:  ( - ) nausea, ( - ) heartburn, ( - ) change in bowel habits Skin: ( - ) abnormal skin rashes Lymphatics: ( - ) new lymphadenopathy, ( - ) easy bruising Neurological: ( - ) numbness, ( - ) tingling, ( - ) new weaknesses Behavioral/Psych: ( - ) mood change, ( - ) new changes  All other systems were reviewed with the patient and are negative.  PHYSICAL EXAMINATION: ECOG PERFORMANCE STATUS: 1 - Symptomatic but completely ambulatory  Vitals:   02/17/22 0838  BP: 125/80  Pulse: (!) 102  Resp: 16  Temp: 97.9 F (36.6 C)  SpO2: 100%    Filed Weights   02/17/22 1191  Weight: 152 lb 4.8 oz (69.1 kg)     GENERAL: Well-appearing elderly African-American male, alert, no distress and comfortable SKIN: skin color, texture, turgor are normal, no rashes or significant lesions EYES:  conjunctiva are pink and non-injected, sclera clear LUNGS: clear to auscultation and percussion with normal breathing effort HEART: regular rate & rhythm and no murmurs and no lower extremity edema Musculoskeletal: no cyanosis of digits and no clubbing  PSYCH: alert & oriented x 3, fluent speech NEURO: no focal motor/sensory deficits  LABORATORY DATA:  I have reviewed the data as listed    Latest Ref Rng & Units 02/17/2022    8:14 AM 01/27/2022    9:29 AM 01/06/2022    9:41 AM  CBC  WBC 4.0 - 10.5 K/uL 14.4  15.3  8.5   Hemoglobin 13.0 - 17.0 g/dL 8.1  8.2  8.4   Hematocrit 39.0 - 52.0 % 23.7  24.1  25.2   Platelets 150 - 400 K/uL 461  482  441        Latest Ref Rng & Units 02/17/2022    8:14 AM 01/27/2022    9:29 AM 01/06/2022    9:41 AM  CMP  Glucose 70 - 99 mg/dL 112  83  93   BUN 8 - 23 mg/dL 19  18  12    Creatinine 0.61 - 1.24 mg/dL 1.04  0.97  0.89   Sodium 135 - 145 mmol/L 134  133  133   Potassium 3.5 - 5.1 mmol/L 4.5  4.4  4.5   Chloride 98 - 111 mmol/L 106  104  107   CO2 22 - 32 mmol/L 20  23  20    Calcium 8.9 - 10.3 mg/dL 9.1  9.2  9.1   Total Protein 6.5 - 8.1 g/dL 8.0  8.1  7.5   Total Bilirubin 0.3 - 1.2 mg/dL 0.3  0.3  0.2   Alkaline Phos 38 - 126 U/L 82  89  73   AST 15 - 41 U/L 22  30  34   ALT 0 - 44 U/L 16  28  31      Lab Results  Component Value Date   MPROTEIN Not Observed 04/28/2021   Lab Results  Component Value Date   KPAFRELGTCHN 33.6 (H) 04/28/2021   LAMBDASER 22.0 04/28/2021   KAPLAMBRATIO 1.53 04/28/2021    RADIOGRAPHIC STUDIES: VAS [place] ABI WITH/WO TBI  Result Date: 01/26/2022  LOWER EXTREMITY DOPPLER STUDY Patient Name:  KENO CARAWAY.  Date of Exam:   01/26/2022 Medical Rec #: [id number]             Accession #:    [id number] Date of Birth: 22-Feb-1955              Patient Gender: M Patient Age:   46 years Exam Location:  [facility] Procedure:      VAS [place] ABI WITH/WO TBI Referring Phys: Gardiner Barefoot  --------------------------------------------------------------------------------  Indications: Nocturnal right pedal parasthesia High Risk Factors: Hypertension, past history of smoking.  Comparison Study: No prior study Performing Technologist: Maudry Mayhew MHA, RVT, RDCS, RDMS  Examination Guidelines: A complete evaluation includes at minimum, Doppler waveform signals and systolic blood pressure reading at the level of bilateral brachial, anterior tibial, and posterior tibial arteries, when vessel segments are accessible. Bilateral testing is considered an integral part of a complete examination. Photoelectric Plethysmograph (PPG) waveforms and toe systolic pressure readings are included as required and additional duplex testing as needed.  Limited examinations for reoccurring indications may be performed as noted.  ABI Findings: +---------+------------------+-----+----------+--------+ Right    Rt Pressure (mmHg)IndexWaveform  Comment  +---------+------------------+-----+----------+--------+ Brachial 135                                       +---------+------------------+-----+----------+--------+ PTA      105               0.78 monophasic         +---------+------------------+-----+----------+--------+ DP       94                0.70 monophasic         +---------+------------------+-----+----------+--------+ Great Toe80                0.59                    +---------+------------------+-----+----------+--------+ +---------+------------------+-----+---------+-------+ Left     Lt Pressure (mmHg)IndexWaveform Comment +---------+------------------+-----+---------+-------+ Brachial 129                                     +---------+------------------+-----+---------+-------+ PTA      110               0.81 triphasic        +---------+------------------+-----+---------+-------+ DP       114               0.84 triphasic         +---------+------------------+-----+---------+-------+ Great Toe101               0.75                  +---------+------------------+-----+---------+-------+ +-------+-----------+-----------+------------+------------+ ABI/TBIToday's ABIToday's TBIPrevious ABIPrevious TBI +-------+-----------+-----------+------------+------------+ Right  0.78       0.59                                +-------+-----------+-----------+------------+------------+ Left   0.84       0.75                                +-------+-----------+-----------+------------+------------+  Summary: Right: Resting right ankle-brachial index indicates moderate right lower extremity arterial disease. The right toe-brachial index is abnormal. Left: Resting left ankle-brachial index indicates mild left lower extremity arterial disease. The left toe-brachial index is normal. *See table(s) above for measurements and observations.  Electronically signed by Deitra Mayo MD on 01/26/2022 at 11:41:15 AM.    Final     ASSESSMENT & PLAN Terry Page. 67 y.o. male with medical history significant for newly diagnosed metastatic adenocarcinoma of the lung who presents for a follow up visit.  # Metastatic Adenocarcinoma of the Lung  -- NGS testing from this patient shows no evidence of targetable mutation. --MRI brain shows no evidence of intracranial metastases --Started carboplatin, pembrolizumab, and pemetrexed as treatment for his cancer on 07/08/2021. --Received palliative radiation to osseous metastasis of the left shoulder from 08/11/2021-08/24/2021. Planned dose is 30 Gy in 10 fx.  Plan: --today is Day 1 Cycle 11 of Pem/Pem for metastatic adenocarcinoma of the lung --labs from today were reviewed. Labs show white blood cell count 14.4, hemoglobin 8.1, MCV 99.2, and platelets of  461. --CT C/A/P next due in March 2024. Last scan in Dec 2023 showed response to therapy.  --RTC in 3 weeks time port labs, f/u visit and  Cycle 12, Day 1 of chemotherapy  #Left shoulder pain: --Secondary to left scapular metastasis.  --Most recent CT from 09/23/2021 showed stable osseous disease  #Normocytic anemia-worsening --Likely secondary to chemotherapy --Hgb 8.1 --continue to monitor closely  #Pain Control -- Currently tylenol 500 mg PO q6H PRN --stopped oxycodone due to itching --currently following with Palliative care.  --Reports left shoulder pain is increasing and so he is taking MSIR more frequently.  --Continue senna docusate for constipation prophylaxis --Continue to monitor  #Supportive Care -- chemotherapy education complete -- port placed -- zofran 8mg  q8H PRN and compazine 10mg  PO q6H for nausea -- EMLA cream for port -- Patient referred to palliative care for pain control. Pain control as above.    Orders Placed This Encounter  Procedures   CT CHEST ABDOMEN PELVIS W CONTRAST    Standing Status:   Future    Standing Expiration Date:   02/18/2023    Order Specific Question:   If indicated for the ordered procedure, I authorize the administration of contrast media per Radiology protocol    Answer:   Yes    Order Specific Question:   Does the patient have a contrast media/X-ray dye allergy?    Answer:   No    Order Specific Question:   Preferred imaging location?    Answer:   [facility]    Order Specific Question:   Is Oral Contrast requested for this exam?    Answer:   Yes, Per Radiology protocol    All questions were answered. The patient knows to call the clinic with any problems, questions or concerns.  I have spent a total of 30 minutes minutes of face-to-face and non-face-to-face time, preparing to see the patient, performing a medically appropriate examination, counseling and educating the patient, referring and communicating with other health care professionals, documenting clinical information in the electronic health record, and care coordination.   Ledell Peoples,  MD Department of Hematology/Oncology [place] [facility] Phone: [phone] Pager: [phone] Email: [email or web address]   02/17/2022 9:35 AM

## 2022-02-18 LAB — T4: T4, Total: 7.2 ug/dL (ref 4.5–12.0)

## 2022-02-24 LAB — METHYLMALONIC ACID, SERUM: Methylmalonic Acid, Quantitative: 194 nmol/L (ref 0–378)

## 2022-02-27 ENCOUNTER — Inpatient Hospital Stay (HOSPITAL_BASED_OUTPATIENT_CLINIC_OR_DEPARTMENT_OTHER): Payer: Medicare HMO | Admitting: Nurse Practitioner

## 2022-02-27 DIAGNOSIS — Z515 Encounter for palliative care: Secondary | ICD-10-CM | POA: Diagnosis not present

## 2022-02-27 DIAGNOSIS — G893 Neoplasm related pain (acute) (chronic): Secondary | ICD-10-CM

## 2022-02-27 DIAGNOSIS — K5900 Constipation, unspecified: Secondary | ICD-10-CM

## 2022-02-27 NOTE — Progress Notes (Signed)
i    [place]  [phone] Fax:(336) [phone]   Name: Terry Page. Date: 02/27/2022 MRN: [id number]  DOB: December 07, 1955  Patient Care Team: [name], MD as PCP - General (Family Medicine)   I connected with Terry Page. on 02/27/22 at 12:00 PM EST by phone and verified that I am speaking with the correct person using two identifiers.   I discussed the limitations, risks, security and privacy concerns of performing an evaluation and management service by telemedicine and the availability of in-person appointments. I also discussed with the patient that there may be a patient responsible charge related to this service. The patient expressed understanding and agreed to proceed.   Other persons participating in the visit and their role in the encounter: n/a   Patient's location: Home  Provider's location: [facility]   Chief Complaint: f/u symptom management   INTERVAL HISTORY: Terry Page. is a 67 y.o. 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  Palliative ask to see for symptom management.  SOCIAL HISTORY:     reports that he quit smoking about 2 years ago. His smoking use included cigarettes. He has never used smokeless tobacco. He reports that he does not currently use alcohol after a past usage of about 12.0 standard drinks of alcohol per week. He reports that he does not use drugs.  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 Gery Pray   Incidental lung nodule, greater than or equal to 13m 10/22/2017   Left  upper lobe - discovered on CTA   [place]    Spontaneous pneumothorax 10/20/2017   right   Tobacco abuse     ALLERGIES:  is allergic to morphine and oxycodone.  MEDICATIONS:  Current Outpatient Medications  Medication Sig Dispense Refill   acetaminophen (TYLENOL) 325 MG tablet Take 2 tablets (650 mg total) by mouth every 6 (six) hours as needed for mild pain or headache (fever >/= 101).     albuterol (VENTOLIN HFA) 108 (90 Base) MCG/ACT inhaler Inhale 2 puffs into the lungs every 6 (six) hours as needed for wheezing or shortness of breath. 8 g 1   amLODipine (NORVASC) 10 MG tablet Take 1 tablet by mouth daily.     atorvastatin (LIPITOR) 20 MG tablet Take 20 mg by mouth daily.     cyanocobalamin (VITAMIN B12) 1000 MCG tablet Take 1 tablet (1,000 mcg total) by mouth daily. (Patient not taking: Reported on 02/17/2022) 90 tablet 1   docusate sodium (COLACE) 100 MG capsule Take 100 mg by mouth 2 (two) times daily. (Patient not taking: Reported on [id number]     folic acid (FOLVITE) 1 MG tablet Take 1 tablet (1 mg total) by mouth daily. 30 tablet 3   hydrOXYzine (ATARAX) 25 MG tablet Take 1 tablet (25 mg total) by mouth at bedtime as needed for itching. 30 tablet 11   ipratropium-albuterol (DUONEB) 0.5-2.5 (3) MG/3ML SOLN Take 3 mLs by nebulization every 6 (six) hours as needed. (Patient taking differently: Take 3 mLs by nebulization every 6 (six)  hours as needed (shortness of breathing or wheezing).) 360 mL 0   lidocaine (LIDODERM) 5 % Place 1 patch onto the skin daily. Remove & Discard patch within 12 hours or as directed by MD. Apply to low back (Patient not taking: Reported on 02/17/2022) 30 patch 0   lidocaine-prilocaine (EMLA) cream Apply 1 Application topically as needed. 30 g 0   losartan (COZAAR) 100 MG tablet Take 100 mg by mouth daily.     olmesartan (BENICAR) 40 MG tablet Take 40 mg by mouth daily.     ondansetron (ZOFRAN) 8 MG tablet Take 1 tablet (8 mg total) by mouth  every 8 (eight) hours as needed. 30 tablet 0   polyethylene glycol (MIRALAX) 17 g packet Take 17 g by mouth daily. (Patient not taking: Reported on 02/17/2022) 30 each 2   potassium chloride SA (KLOR-CON M) 20 MEQ tablet TAKE 1 TABLET(20 MEQ) BY MOUTH DAILY 90 tablet 0   prochlorperazine (COMPAZINE) 10 MG tablet Take 1 tablet (10 mg total) by mouth every 6 (six) hours as needed for nausea or vomiting. 30 tablet 0   senna-docusate (STIMULANT LAXATIVE) 8.6-50 MG tablet Take 2 tablets by mouth 2 (two) times daily. May add additional tablet as needed 150 tablet 3   thiamine 100 MG tablet Take 1 tablet (100 mg total) by mouth daily.     traMADol (ULTRAM) 50 MG tablet Take 1-2 tablets (50-100 mg total) by mouth every 6 (six) hours as needed. 90 tablet 0   No current facility-administered medications for this visit.    VITAL SIGNS: There were no vitals taken for this visit. There were no vitals filed for this visit.  Estimated body mass index is 23.16 kg/m as calculated from the following:   Height as of 11/16/21: '5\' 8"'$  (1.727 m).   Weight as of 02/17/22: 152 lb 4.8 oz (69.1 kg).   PERFORMANCE STATUS (ECOG) : 1 - Symptomatic but completely ambulatory   IMPRESSION:  I connected with Terry Page by phone for symptom follow-up. No acute distress noted. He is trying to be as active as possible. Appetite is good. Denies nausea, vomiting, constipation, or diarrhea.   Neoplasm related pain  Terry Page reports pain is well controlled on current regimen. Denies any unwanted side effects. Tolerating well.   He is taking tramadol '50mg'$  as needed. Does not require around the clock. 1-2 times daily. Reports pain decreases to 2-3 out of 10 when he does take it. Much appreciative of his level of comfort.   No changes at this time. We will continue to closely monitor.   Constipation   Controlled with daily Miralax and Senna.   PLAN: Tramadol 50-'100mg'$  every 6 hours as needed Miralax/Senna daily  I will plan  to see patient back in 3-4 weeks in collaboration with his other oncology appointments. Will give him a call next week for close symptom follow-up.    Patient expressed understanding and was in agreement with this plan. He also understands that He can call the clinic at any time with any questions, concerns, or complaints.    Any controlled substances utilized were prescribed in the context of palliative care. PDMP has been reviewed.    Time Total: 20 min.   Visit consisted of counseling and education dealing with the complex and emotionally intense issues of symptom management and palliative care in the setting of serious and potentially life-threatening illness.Greater than 50%  of this time was spent counseling and coordinating care related to the above  assessment and plan.  Alda Lea, AGPCNP-BC  Palliative Medicine Team/[place]

## 2022-03-07 ENCOUNTER — Other Ambulatory Visit: Payer: Self-pay

## 2022-03-07 ENCOUNTER — Inpatient Hospital Stay: Payer: Medicare HMO | Attending: Hematology and Oncology

## 2022-03-07 ENCOUNTER — Ambulatory Visit (HOSPITAL_COMMUNITY)
Admission: RE | Admit: 2022-03-07 | Discharge: 2022-03-07 | Disposition: A | Discharge status: Home / Self Care | Payer: Medicare HMO | Source: Ambulatory Visit | Attending: Hematology and Oncology | Admitting: Hematology and Oncology

## 2022-03-07 DIAGNOSIS — Z87891 Personal history of nicotine dependence: Secondary | ICD-10-CM | POA: Insufficient documentation

## 2022-03-07 DIAGNOSIS — G893 Neoplasm related pain (acute) (chronic): Secondary | ICD-10-CM | POA: Insufficient documentation

## 2022-03-07 DIAGNOSIS — C3412 Malignant neoplasm of upper lobe, left bronchus or lung: Secondary | ICD-10-CM

## 2022-03-07 DIAGNOSIS — K5900 Constipation, unspecified: Secondary | ICD-10-CM | POA: Insufficient documentation

## 2022-03-07 DIAGNOSIS — C7951 Secondary malignant neoplasm of bone: Secondary | ICD-10-CM | POA: Insufficient documentation

## 2022-03-07 DIAGNOSIS — Z8546 Personal history of malignant neoplasm of prostate: Secondary | ICD-10-CM | POA: Insufficient documentation

## 2022-03-07 DIAGNOSIS — T451X5D Adverse effect of antineoplastic and immunosuppressive drugs, subsequent encounter: Secondary | ICD-10-CM | POA: Insufficient documentation

## 2022-03-07 DIAGNOSIS — Z809 Family history of malignant neoplasm, unspecified: Secondary | ICD-10-CM | POA: Insufficient documentation

## 2022-03-07 DIAGNOSIS — Z515 Encounter for palliative care: Secondary | ICD-10-CM | POA: Insufficient documentation

## 2022-03-07 DIAGNOSIS — D6481 Anemia due to antineoplastic chemotherapy: Secondary | ICD-10-CM | POA: Insufficient documentation

## 2022-03-07 DIAGNOSIS — Z923 Personal history of irradiation: Secondary | ICD-10-CM | POA: Insufficient documentation

## 2022-03-07 DIAGNOSIS — I10 Essential (primary) hypertension: Secondary | ICD-10-CM | POA: Insufficient documentation

## 2022-03-07 DIAGNOSIS — Z79891 Long term (current) use of opiate analgesic: Secondary | ICD-10-CM | POA: Insufficient documentation

## 2022-03-07 DIAGNOSIS — Z5112 Encounter for antineoplastic immunotherapy: Secondary | ICD-10-CM | POA: Insufficient documentation

## 2022-03-07 DIAGNOSIS — Z79899 Other long term (current) drug therapy: Secondary | ICD-10-CM | POA: Insufficient documentation

## 2022-03-07 MED ORDER — HEPARIN SOD (PORK) LOCK FLUSH 100 UNIT/ML IV SOLN: 500.0000 [IU] | Freq: Once | INTRAVENOUS | Status: AC

## 2022-03-07 MED ORDER — FOLIC ACID 1 MG PO TABS: 1.0000 mg | ORAL_TABLET | Freq: Every day | ORAL | 3 refills | Status: DC

## 2022-03-07 MED ORDER — IOHEXOL 300 MG/ML  SOLN
100.0000 mL | Freq: Once | INTRAMUSCULAR | Status: AC | PRN
  Administered 2022-03-07: 100 mL via INTRAVENOUS

## 2022-03-07 MED ORDER — HEPARIN SOD (PORK) LOCK FLUSH 100 UNIT/ML IV SOLN
INTRAVENOUS | Status: AC
  Administered 2022-03-07: 500 [IU] via INTRAVENOUS
  Filled 2022-03-07: qty 5

## 2022-03-07 MED ORDER — SODIUM CHLORIDE (PF) 0.9 % IJ SOLN
INTRAMUSCULAR | Status: AC
  Filled 2022-03-07: qty 50

## 2022-03-08 ENCOUNTER — Telehealth: Payer: Self-pay

## 2022-03-08 NOTE — Telephone Encounter (Signed)
PA request received via CMM for hydrOXYzine HCl '25MG'$  tablets  PA submitted to [facility] and is pending determination.   Key: [id number]

## 2022-03-09 NOTE — Telephone Encounter (Signed)
PA has been APPROVED from 03/08/2022-01/02/2023

## 2022-03-10 ENCOUNTER — Ambulatory Visit: Payer: Medicare HMO | Admitting: Hematology and Oncology

## 2022-03-10 ENCOUNTER — Inpatient Hospital Stay: Payer: Medicare HMO

## 2022-03-10 ENCOUNTER — Other Ambulatory Visit: Payer: Medicare HMO

## 2022-03-10 ENCOUNTER — Other Ambulatory Visit: Payer: Self-pay

## 2022-03-10 ENCOUNTER — Inpatient Hospital Stay (HOSPITAL_BASED_OUTPATIENT_CLINIC_OR_DEPARTMENT_OTHER): Payer: Medicare HMO | Admitting: Physician Assistant

## 2022-03-10 ENCOUNTER — Telehealth: Payer: Self-pay | Admitting: Hematology and Oncology

## 2022-03-10 ENCOUNTER — Ambulatory Visit: Payer: Medicare HMO

## 2022-03-10 VITALS — BP 98/68 | HR 88 | Temp 97.8°F | Resp 16 | Wt 143.1 lb

## 2022-03-10 VITALS — BP 113/74 | HR 68 | Temp 98.2°F | Resp 16

## 2022-03-10 DIAGNOSIS — G893 Neoplasm related pain (acute) (chronic): Secondary | ICD-10-CM | POA: Diagnosis not present

## 2022-03-10 DIAGNOSIS — C3412 Malignant neoplasm of upper lobe, left bronchus or lung: Secondary | ICD-10-CM

## 2022-03-10 DIAGNOSIS — K5900 Constipation, unspecified: Secondary | ICD-10-CM | POA: Diagnosis not present

## 2022-03-10 DIAGNOSIS — T451X5D Adverse effect of antineoplastic and immunosuppressive drugs, subsequent encounter: Secondary | ICD-10-CM | POA: Diagnosis not present

## 2022-03-10 DIAGNOSIS — C7951 Secondary malignant neoplasm of bone: Secondary | ICD-10-CM | POA: Diagnosis not present

## 2022-03-10 DIAGNOSIS — Z923 Personal history of irradiation: Secondary | ICD-10-CM | POA: Diagnosis not present

## 2022-03-10 DIAGNOSIS — Z87891 Personal history of nicotine dependence: Secondary | ICD-10-CM | POA: Diagnosis not present

## 2022-03-10 DIAGNOSIS — Z809 Family history of malignant neoplasm, unspecified: Secondary | ICD-10-CM | POA: Diagnosis not present

## 2022-03-10 DIAGNOSIS — Z5112 Encounter for antineoplastic immunotherapy: Secondary | ICD-10-CM | POA: Diagnosis present

## 2022-03-10 DIAGNOSIS — D509 Iron deficiency anemia, unspecified: Secondary | ICD-10-CM | POA: Diagnosis not present

## 2022-03-10 DIAGNOSIS — I10 Essential (primary) hypertension: Secondary | ICD-10-CM | POA: Diagnosis not present

## 2022-03-10 DIAGNOSIS — Z8546 Personal history of malignant neoplasm of prostate: Secondary | ICD-10-CM | POA: Diagnosis not present

## 2022-03-10 DIAGNOSIS — Z515 Encounter for palliative care: Secondary | ICD-10-CM | POA: Diagnosis not present

## 2022-03-10 DIAGNOSIS — D649 Anemia, unspecified: Secondary | ICD-10-CM | POA: Diagnosis not present

## 2022-03-10 DIAGNOSIS — Z79899 Other long term (current) drug therapy: Secondary | ICD-10-CM | POA: Diagnosis not present

## 2022-03-10 DIAGNOSIS — Z95828 Presence of other vascular implants and grafts: Secondary | ICD-10-CM

## 2022-03-10 DIAGNOSIS — D6481 Anemia due to antineoplastic chemotherapy: Secondary | ICD-10-CM | POA: Diagnosis not present

## 2022-03-10 DIAGNOSIS — Z79891 Long term (current) use of opiate analgesic: Secondary | ICD-10-CM | POA: Diagnosis not present

## 2022-03-10 LAB — CMP (CANCER CENTER ONLY)
ALT: 34 U/L (ref 0–44)
AST: 39 U/L (ref 15–41)
Albumin: 3.1 g/dL — ABNORMAL LOW (ref 3.5–5.0)
Alkaline Phosphatase: 66 U/L (ref 38–126)
Anion gap: 9 (ref 5–15)
BUN: 24 mg/dL — ABNORMAL HIGH (ref 8–23)
CO2: 18 mmol/L — ABNORMAL LOW (ref 22–32)
Calcium: 8.4 mg/dL — ABNORMAL LOW (ref 8.9–10.3)
Chloride: 99 mmol/L (ref 98–111)
Creatinine: 2.13 mg/dL — ABNORMAL HIGH (ref 0.61–1.24)
GFR, Estimated: 33 mL/min — ABNORMAL LOW (ref >60)
Glucose, Bld: 102 mg/dL — ABNORMAL HIGH (ref 70–99)
Potassium: 4.5 mmol/L (ref 3.5–5.1)
Sodium: 126 mmol/L — ABNORMAL LOW (ref 135–145)
Total Bilirubin: 0.2 mg/dL — ABNORMAL LOW (ref 0.3–1.2)
Total Protein: 8.4 g/dL — ABNORMAL HIGH (ref 6.5–8.1)

## 2022-03-10 LAB — IRON AND IRON BINDING CAPACITY (CC-WL,HP ONLY)
Iron: 43 ug/dL — ABNORMAL LOW (ref 45–182)
Saturation Ratios: 16 % — ABNORMAL LOW (ref 17.9–39.5)
TIBC: 270 ug/dL (ref 250–450)
UIBC: 227 ug/dL (ref 117–376)

## 2022-03-10 LAB — CBC WITH DIFFERENTIAL (CANCER CENTER ONLY)
Abs Immature Granulocytes: 0.25 10*3/uL — ABNORMAL HIGH (ref 0.00–0.07)
Abs Immature Granulocytes: 0.27 10*3/uL — ABNORMAL HIGH (ref 0.00–0.07)
Basophils Absolute: 0 10*3/uL (ref 0.0–0.1)
Basophils Absolute: 0 10*3/uL (ref 0.0–0.1)
Basophils Relative: 0 %
Basophils Relative: 0 %
Eosinophils Absolute: 0.4 10*3/uL (ref 0.0–0.5)
Eosinophils Absolute: 0.4 10*3/uL (ref 0.0–0.5)
Eosinophils Relative: 3 %
Eosinophils Relative: 3 %
HCT: 19.6 % — ABNORMAL LOW (ref 39.0–52.0)
HCT: 20.1 % — ABNORMAL LOW (ref 39.0–52.0)
Hemoglobin: 6.4 g/dL — CL (ref 13.0–17.0)
Hemoglobin: 6.7 g/dL — CL (ref 13.0–17.0)
Immature Granulocytes: 2 %
Immature Granulocytes: 2 %
Lymphocytes Relative: 9 %
Lymphocytes Relative: 9 %
Lymphs Abs: 1 10*3/uL (ref 0.7–4.0)
Lymphs Abs: 1 10*3/uL (ref 0.7–4.0)
MCH: 33.3 pg (ref 26.0–34.0)
MCH: 33.8 pg (ref 26.0–34.0)
MCHC: 32.7 g/dL (ref 30.0–36.0)
MCHC: 33.3 g/dL (ref 30.0–36.0)
MCV: 102.1 fL — ABNORMAL HIGH (ref 80.0–100.0)
MCV: 101.5 fL — ABNORMAL HIGH (ref 80.0–100.0)
Monocytes Absolute: 1.9 10*3/uL — ABNORMAL HIGH (ref 0.1–1.0)
Monocytes Absolute: 2 10*3/uL — ABNORMAL HIGH (ref 0.1–1.0)
Monocytes Relative: 17 %
Monocytes Relative: 18 %
Neutro Abs: 7.7 10*3/uL (ref 1.7–7.7)
Neutro Abs: 7.7 10*3/uL (ref 1.7–7.7)
Neutrophils Relative %: 69 %
Neutrophils Relative %: 68 %
Platelet Count: 417 10*3/uL — ABNORMAL HIGH (ref 150–400)
Platelet Count: 433 10*3/uL — ABNORMAL HIGH (ref 150–400)
RBC: 1.92 MIL/uL — ABNORMAL LOW (ref 4.22–5.81)
RBC: 1.98 MIL/uL — ABNORMAL LOW (ref 4.22–5.81)
RDW: 17.7 % — ABNORMAL HIGH (ref 11.5–15.5)
RDW: 17.6 % — ABNORMAL HIGH (ref 11.5–15.5)
WBC Count: 11.3 10*3/uL — ABNORMAL HIGH (ref 4.0–10.5)
WBC Count: 11.4 10*3/uL — ABNORMAL HIGH (ref 4.0–10.5)
nRBC: 0.2 % (ref 0.0–0.2)
nRBC: 0 % (ref 0.0–0.2)

## 2022-03-10 LAB — PREPARE RBC (CROSSMATCH)

## 2022-03-10 LAB — SAMPLE TO BLOOD BANK

## 2022-03-10 LAB — FERRITIN: Ferritin: 1359 ng/mL — ABNORMAL HIGH (ref 24–336)

## 2022-03-10 LAB — TSH: TSH: 4.052 u[IU]/mL (ref 0.350–4.500)

## 2022-03-10 MED ORDER — ACETAMINOPHEN 325 MG PO TABS
650.0000 mg | ORAL_TABLET | Freq: Once | ORAL | Status: AC
  Administered 2022-03-10: 650 mg via ORAL
  Filled 2022-03-10: qty 2

## 2022-03-10 MED ORDER — FERROUS SULFATE 325 (65 FE) MG PO TBEC: 325.0000 mg | DELAYED_RELEASE_TABLET | Freq: Every day | ORAL | 3 refills | Status: DC

## 2022-03-10 MED ORDER — SODIUM CHLORIDE 0.9% FLUSH
10.0000 mL | Freq: Once | INTRAVENOUS | Status: AC
  Administered 2022-03-10: 10 mL

## 2022-03-10 MED ORDER — SODIUM CHLORIDE 0.9% IV SOLUTION
250.0000 mL | Freq: Once | INTRAVENOUS | Status: AC
  Administered 2022-03-10: 250 mL via INTRAVENOUS

## 2022-03-10 MED ORDER — HEPARIN SOD (PORK) LOCK FLUSH 100 UNIT/ML IV SOLN
500.0000 [IU] | Freq: Every day | INTRAVENOUS | Status: AC | PRN
  Administered 2022-03-10: 500 [IU]

## 2022-03-10 MED ORDER — DIPHENHYDRAMINE HCL 25 MG PO CAPS
25.0000 mg | ORAL_CAPSULE | Freq: Once | ORAL | Status: AC
  Administered 2022-03-10: 25 mg via ORAL
  Filled 2022-03-10: qty 1

## 2022-03-10 MED ORDER — SODIUM CHLORIDE 0.9% FLUSH
10.0000 mL | INTRAVENOUS | Status: AC | PRN
  Administered 2022-03-10: 10 mL

## 2022-03-10 NOTE — Telephone Encounter (Signed)
Called patient to confirm new appointments. Patient left treatment before rescheduling appointments was completed. Patient notified and transportation liaison has been notified.

## 2022-03-10 NOTE — Progress Notes (Signed)
[place] [phone]   [phone]  PROGRESS NOTE  Patient Care Team: [name], MD as PCP - General (Family Medicine)  Hematological/Oncological History # Metastatic Adenocarcinoma of the Lung  01/07/2020 : CT of the chest with contrast performed which showed interval enlargement of a spiculated nodule in the central left upper lobe measuring 1.2 x 1.0 cm and highly suspicious for primary lung malignancy 02/06/2020 :PET scan performed which showed a hypermetabolic irregular solid 1.3 cm left upper lobe pulmonary nodule compatible with malignancy without any other hypermetabolic lesions [id number] 04/19/2020:  SBRT to the lung lesion 04/28/2021: CT C/A/P showed multifocal lytic bone metastases are identified. Lesion within the spine of the left scapula and lesions involving bilateral iliac bones and left sacral wing. Establish care with Dr. Lorenso Courier  05/03/2021: biopsy of right iliac lytic lesion showed metastatic carcinoma, consistent with lung primary.  07/08/2021: Cycle 1 Day 1 of Carbo/Pem/Pem 07/29/2021: Cycle 2 Day 1 of Carbo/Pem/Pem 08/11/2021-08/24/2021: Received palliative radiation to osseous metastasis in the left shoulder. 30 Gy in 10 fractions.  08/19/2021: Cycle 3 Day 1 of Carbo/Pem/Pem 09/09/2021: Cycle 4 Day 1 of Carbo/Pem/Pem 09/23/2021: CT CAP: stable disease 09/30/2021: Cycle 5 Day 1 of Pem/Pem 10/21/2021: Cycle 6 Day 1 of Pembrolizumab. Held pemetrexed due to anemia. 11/16/2021: Cycle 7 Day 1 of Pem/Pem.   12/16/2021: Cycle 8 Day 1 of Pem/Pem.   01/06/2022: Cycle 9 Day 1 of Pem/Pem.  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  Radiation to the prostate was under the care of Dr. Tyler Pita  Interval History:  Terry Page. 67 y.o.  male with medical history significant for metastatic adenocarcinoma of the lung who presents for a follow up visit. The patient's last visit was on 02/17/2022. In the interim since the last visit he has completed Cycle 11 of chemotherapy.  On exam today Mr. Campise reports that he is feeling well with stable energy levels. He reports that he is trying to stay hydrated and drinking Gatorade regularly. He reports his shoulder pain is well controlled with his current pain regimen with Tramadol. He denies nausea, vomiting or abdominal pain. His bowel habits are unchanged without recurrent episodes of diarrhea or constipation. He denies easy bruising or signs of bleeding including hematochezia or melena. He denies fevers, chills,sweats, shortness of breath, chest pain, cough, headaches, dizziness or syncope. He has no other complaints. A full 10 point ROS is listed below.  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 Gery Pray   Incidental lung nodule, greater than or equal to 45m 10/22/2017   Left upper lobe - discovered on CTA   [place]    Spontaneous pneumothorax 10/20/2017   right   Tobacco abuse     SURGICAL HISTORY: Past Surgical History:  Procedure Laterality Date   BACK SURGERY     IR IMAGING GUIDED PORT INSERTION  06/29/2021   PROSTATE BIOPSY      SOCIAL HISTORY: Social History   Socioeconomic History   Marital status: Single    Spouse name:  Not on file   Number of children: Not on file   Years of education: Not on file   Highest education level: Not on file  Occupational History   Occupation: retired  Tobacco Use   Smoking status: Former    Types: Cigarettes    Quit date: 11/22/2019    Years since quitting: 2.2   Smokeless tobacco: Never   Tobacco comments:    Patient  reports quit 3 years ago. 06/25/20. [facility]  Vaping Use   Vaping Use: Never used  Substance and Sexual Activity   Alcohol use: Not Currently    Alcohol/week: 12.0 standard drinks of alcohol    Types: 12 Cans of beer per week    Comment: daily   Drug use: No   Sexual activity: Not Currently  Other Topics Concern   Not on file  Social History Narrative   Not on file   Social Determinants of Health   Financial Resource Strain: Not on file  Food Insecurity: Not on file  Transportation Needs: Unmet Transportation Needs (07/18/2021)   PRAPARE - Hydrologist (Medical): Yes    Lack of Transportation (Non-Medical): Yes  Physical Activity: Not on file  Stress: Not on file  Social Connections: Not on file  Intimate Partner Violence: Not on file    FAMILY HISTORY: Family History  Problem Relation Age of Onset   Cancer Cousin        maternal cousin   Cancer Cousin        paternal cousin   Cancer Cousin    Colon polyps Neg Hx    Pancreatic disease Neg Hx    Pancreatic cancer Neg Hx    Breast cancer Neg Hx    Colon cancer Neg Hx     ALLERGIES:  is allergic to morphine and oxycodone.  MEDICATIONS:  Current Outpatient Medications  Medication Sig Dispense Refill   acetaminophen (TYLENOL) 325 MG tablet Take 2 tablets (650 mg total) by mouth every 6 (six) hours as needed for mild pain or headache (fever >/= 101).     albuterol (VENTOLIN HFA) 108 (90 Base) MCG/ACT inhaler Inhale 2 puffs into the lungs every 6 (six) hours as needed for wheezing or shortness of breath. 8 g 1   amLODipine (NORVASC) 10 MG tablet Take 1 tablet by mouth daily.     atorvastatin (LIPITOR) 20 MG tablet Take 20 mg by mouth daily.     cyanocobalamin (VITAMIN B12) 1000 MCG tablet Take 1 tablet (1,000 mcg total) by mouth daily. 90 tablet 1   folic acid (FOLVITE) 1 MG tablet Take 1 tablet (1 mg total) by mouth daily. 30 tablet 3   hydrOXYzine (ATARAX) 25 MG tablet Take 1 tablet (25 mg  total) by mouth at bedtime as needed for itching. 30 tablet 11   ipratropium-albuterol (DUONEB) 0.5-2.5 (3) MG/3ML SOLN Take 3 mLs by nebulization every 6 (six) hours as needed. (Patient taking differently: Take 3 mLs by nebulization every 6 (six) hours as needed (shortness of breathing or wheezing).) 360 mL 0   lidocaine-prilocaine (EMLA) cream Apply 1 Application topically as needed. 30 g 0   losartan (COZAAR) 100 MG tablet Take 100 mg by mouth daily.     olmesartan (BENICAR) 40 MG tablet Take 40 mg by mouth daily.     ondansetron (ZOFRAN) 8 MG tablet Take 1 tablet (8 mg total) by mouth every 8 (eight) hours as needed. 30 tablet 0   potassium chloride SA (KLOR-CON M)  20 MEQ tablet TAKE 1 TABLET(20 MEQ) BY MOUTH DAILY 90 tablet 0   prochlorperazine (COMPAZINE) 10 MG tablet Take 1 tablet (10 mg total) by mouth every 6 (six) hours as needed for nausea or vomiting. 30 tablet 0   senna-docusate (STIMULANT LAXATIVE) 8.6-50 MG tablet Take 2 tablets by mouth 2 (two) times daily. May add additional tablet as needed 150 tablet 3   thiamine 100 MG tablet Take 1 tablet (100 mg total) by mouth daily.     traMADol (ULTRAM) 50 MG tablet Take 1-2 tablets (50-100 mg total) by mouth every 6 (six) hours as needed. 90 tablet 0   docusate sodium (COLACE) 100 MG capsule Take 100 mg by mouth 2 (two) times daily. (Patient not taking: Reported on 02/17/2022)     lidocaine (LIDODERM) 5 % Place 1 patch onto the skin daily. Remove & Discard patch within 12 hours or as directed by MD. Apply to low back (Patient not taking: Reported on 02/17/2022) 30 patch 0   polyethylene glycol (MIRALAX) 17 g packet Take 17 g by mouth daily. (Patient not taking: Reported on 02/17/2022) 30 each 2   No current facility-administered medications for this visit.   Facility-Administered Medications Ordered in Other Visits  Medication Dose Route Frequency Provider Last Rate Last Admin   heparin lock flush 100 unit/mL  500 Units Intracatheter Daily  PRN Dede Query T, PA-C       sodium chloride flush (NS) 0.9 % injection 10 mL  10 mL Intracatheter PRN Dede Query T, PA-C        REVIEW OF SYSTEMS:   Constitutional: ( - ) fevers, ( - )  chills , ( - ) night sweats Eyes: ( - ) blurriness of vision, ( - ) double vision, ( - ) watery eyes Ears, nose, mouth, throat, and face: ( - ) mucositis, ( - ) sore throat Respiratory: ( - ) cough, ( - ) dyspnea, ( - ) wheezes Cardiovascular: ( - ) palpitation, ( - ) chest discomfort, ( - ) lower extremity swelling Gastrointestinal:  ( - ) nausea, ( - ) heartburn, ( - ) change in bowel habits Skin: ( - ) abnormal skin rashes Lymphatics: ( - ) new lymphadenopathy, ( - ) easy bruising Neurological: ( - ) numbness, ( - ) tingling, ( - ) new weaknesses Behavioral/Psych: ( - ) mood change, ( - ) new changes  All other systems were reviewed with the patient and are negative.  PHYSICAL EXAMINATION: ECOG PERFORMANCE STATUS: 1 - Symptomatic but completely ambulatory  Vitals:   03/10/22 1031  BP: 98/68  Pulse: 88  Resp: 16  Temp: 97.8 F (36.6 C)  SpO2: 91%    Filed Weights   03/10/22 1031  Weight: 143 lb 1.6 oz (64.9 kg)     GENERAL: Well-appearing elderly African-American male, alert, no distress and comfortable SKIN: skin color, texture, turgor are normal, no rashes or significant lesions EYES: conjunctiva are pink and non-injected, sclera clear LUNGS: clear to auscultation and percussion with normal breathing effort HEART: regular rate & rhythm and no murmurs and no lower extremity edema Musculoskeletal: no cyanosis of digits and no clubbing  PSYCH: alert & oriented x 3, fluent speech NEURO: no focal motor/sensory deficits  LABORATORY DATA:  I have reviewed the data as listed    Latest Ref Rng & Units 03/10/2022   11:00 AM 03/10/2022    9:50 AM 02/17/2022    8:14 AM  CBC  WBC 4.0 - 10.5 K/uL  11.4  11.3  14.4   Hemoglobin 13.0 - 17.0 g/dL 6.7  6.4  8.1   Hematocrit 39.0 - 52.0 % 20.1   19.6  23.7   Platelets 150 - 400 K/uL 433  417  461        Latest Ref Rng & Units 03/10/2022    9:50 AM 02/17/2022    8:14 AM 01/27/2022    9:29 AM  CMP  Glucose 70 - 99 mg/dL 102  112  83   BUN 8 - 23 mg/dL '24  19  18   '$ Creatinine 0.61 - 1.24 mg/dL 2.13  1.04  0.97   Sodium 135 - 145 mmol/L 126  134  133   Potassium 3.5 - 5.1 mmol/L 4.5  4.5  4.4   Chloride 98 - 111 mmol/L 99  106  104   CO2 22 - 32 mmol/L '18  20  23   '$ Calcium 8.9 - 10.3 mg/dL 8.4  9.1  9.2   Total Protein 6.5 - 8.1 g/dL 8.4  8.0  8.1   Total Bilirubin 0.3 - 1.2 mg/dL 0.2  0.3  0.3   Alkaline Phos 38 - 126 U/L 66  82  89   AST 15 - 41 U/L 39  22  30   ALT 0 - 44 U/L 34  16  28     Lab Results  Component Value Date   MPROTEIN Not Observed 04/28/2021   Lab Results  Component Value Date   KPAFRELGTCHN 33.6 (H) 04/28/2021   LAMBDASER 22.0 04/28/2021   KAPLAMBRATIO 1.53 04/28/2021    RADIOGRAPHIC STUDIES: CT CHEST ABDOMEN PELVIS W CONTRAST  Result Date: 03/07/2022 CLINICAL DATA:  History of metastatic non-small cell lung cancer status post radiation therapy (completed in 2023) undergoing ongoing chemotherapy. Additional history of prostate cancer diagnosed in 2019. Follow-up study. * Tracking Code: BO * EXAM: CT CHEST, ABDOMEN, AND PELVIS WITH CONTRAST TECHNIQUE: Multidetector CT imaging of the chest, abdomen and pelvis was performed following the standard protocol during bolus administration of intravenous contrast. RADIATION DOSE REDUCTION: This exam was performed according to the departmental dose-optimization program which includes automated exposure control, adjustment of the mA and/or kV according to patient size and/or use of iterative reconstruction technique. CONTRAST:  13m OMNIPAQUE IOHEXOL 300 MG/ML  SOLN COMPARISON:  Multiple priors, most recently CT of the chest, abdomen and pelvis 12/08/2021. FINDINGS: CT CHEST FINDINGS Cardiovascular: Heart size is normal. There is no significant pericardial fluid,  thickening or pericardial calcification. There is aortic atherosclerosis, as well as atherosclerosis of the great vessels of the mediastinum and the coronary arteries, including calcified atherosclerotic plaque in the left main, left anterior descending, left circumflex and right coronary arteries. Right internal jugular single-lumen Port-A-Cath with tip terminating in the superior aspect of the right atrium. Mediastinum/Nodes: No pathologically enlarged mediastinal or hilar lymph nodes. Esophagus is unremarkable in appearance. No axillary lymphadenopathy. Lungs/Pleura: Chronic nodular and mass-like areas of architectural distortion in the left lung appear similar to the prior study, most compatible with areas of chronic postradiation mass-like fibrosis, most notable in the posterior aspect of the left upper lobe adjacent to the left hilum on axial image 67 of series 4. More superiorly in the left lung there are areas of interstitial prominence and patchy ill-defined opacities and areas of increasing architectural distortion, most likely to reflect evolving postradiation related to prior radiation therapy to the left scapula (reportedly completed in late August 2023). No other new suspicious appearing pulmonary nodules or  masses are noted. No pleural effusions. Diffuse bronchial wall thickening with moderate centrilobular and severe paraseptal emphysema, including extensive bullous changes in the left upper lobe. Musculoskeletal: Areas of sclerosis and lucency in the left scapula, similar to the prior study, presumably reflective of treated metastatic disease. No new aggressive appearing lytic or blastic lesions are otherwise noted in the visualized portions of the axial or appendicular skeleton. CT ABDOMEN PELVIS FINDINGS Hepatobiliary: Multiple well-defined low-attenuation lesions scattered throughout the liver, similar in size and number compared to the prior examination, compatible with cysts and/or biliary  hamartomas (no imaging follow-up recommended), largest of which is in the superior aspect of segment 4A (axial image 53 of series 2) measuring 1.9 x 1.3 cm. No other aggressive appearing hepatic lesions are noted. No intra or extrahepatic biliary ductal dilatation. Gallbladder is unremarkable in appearance. Pancreas: No pancreatic mass. No pancreatic ductal dilatation. No pancreatic or peripancreatic fluid collections or inflammatory changes. Spleen: Spleen is atrophic with lobular contours (unchanged), but otherwise unremarkable in appearance. Adrenals/Urinary Tract: Multiple well-defined low-attenuation lesions in both kidneys again noted, compatible with simple cysts (no imaging follow-up recommended), largest of which measure up to 2.3 x 1.7 cm in the posterior aspect of the interpolar region of the right kidney. No aggressive appearing renal lesions are noted. No hydroureteronephrosis. Urinary bladder is normal in appearance. Bilateral adrenal glands are normal in appearance. Stomach/Bowel: The appearance of the stomach is normal. There is no pathologic dilatation of small bowel or colon. Normal appendix. Vascular/Lymphatic: Extensive aortic atherosclerosis, without evidence of aneurysm or dissection in the abdominal or pelvic vasculature. No lymphadenopathy noted in the abdomen or pelvis. Reproductive: Prostate gland and seminal vesicles are unremarkable in appearance. Other: No significant volume of ascites.  No pneumoperitoneum. Musculoskeletal: Widespread areas of sclerosis are again noted in the visualized skeleton, most evident throughout the pelvis, most compatible with treated metastatic lesions. No new osseous lesions are otherwise noted. IMPRESSION: 1. Generally stable examination demonstrating multiple osseous metastases, which appear grossly similar to the prior study. No new metastatic lesions to the bones. No new extra skeletal metastatic lesions confidently identified in the chest, abdomen or  pelvis. 2. There is what appears to be evolving postradiation changes in the superior aspect of the left upper lobe adjacent to the treated left scapular lesion. Continued attention on follow-up studies is recommended to ensure the consolidation and contraction of these postradiation changes. 3. Diffuse bronchial wall thickening with moderate centrilobular and severe paraseptal emphysema; imaging findings suggestive of underlying COPD. 4. Aortic atherosclerosis, in addition to left main and three-vessel coronary artery disease. Please note that although the presence of coronary artery calcium documents the presence of coronary artery disease, the severity of this disease and any potential stenosis cannot be assessed on this non-gated CT examination. Assessment for potential risk factor modification, dietary therapy or pharmacologic therapy may be warranted, if clinically indicated. 5. Additional incidental findings, as above, similar to prior studies. Electronically Signed   By: Vinnie Langton M.D.   On: 03/07/2022 11:42    ASSESSMENT & PLAN Terry Page. 67 y.o. male with medical history significant for newly diagnosed metastatic adenocarcinoma of the lung who presents for a follow up visit.  # Metastatic Adenocarcinoma of the Lung  -- NGS testing from this patient shows no evidence of targetable mutation. --MRI brain shows no evidence of intracranial metastases --Started carboplatin, pembrolizumab, and pemetrexed as treatment for his cancer on 07/08/2021. --Received palliative radiation to osseous metastasis of the left shoulder  from 08/11/2021-08/24/2021. Planned dose is 30 Gy in 10 fx.  Plan: --today is Day 1 Cycle 12 of Pem/Pem for metastatic adenocarcinoma of the lung --labs from today were reviewed. Labs show white blood cell count 11.4, hemoglobin 6.4, MCV 101.5, and platelets of 433. --CT C/A/P from 03/07/2022 showed stable disease. Next CT CAP due in June 2024.  --Due to acute anemia, we  will hold treatment today and give 2 units of PRBC. --RTC in 1 week with labs, follow up visit to assess for Cycle 12 of chemotherapy  #Acute anemia: --Hgb level is 6.4 today --will arrange for 2 units of PRBC today --Iron panel shows deficiency with serum iron 43, saturation 16%, ferritin levels pending --Recommend to start ferrous sulfate 325 mg PO daily --Gave stool cards to evaluate for GI bleeding, patient will drop off next week  #Left shoulder pain: --Secondary to left scapular metastasis.  --Most recent CT from 09/23/2021 showed stable osseous disease  #Pain Control --stopped oxycodone due to itching --currently following with Palliative care.  --pain well controlled with tramadol as needed. Continue to take tramdol 50-100 mg q 6 hours as needed --Continue senna docusate for constipation prophylaxis --Continue to monitor  #Supportive Care -- chemotherapy education complete -- port placed -- zofran '8mg'$  q8H PRN and compazine '10mg'$  PO q6H for nausea -- EMLA cream for port -- Patient referred to palliative care for pain control. Pain control as above.    Orders Placed This Encounter  Procedures   Ferritin    Standing Status:   Future    Number of Occurrences:   1    Standing Expiration Date:   03/10/2023   Iron and Iron Binding Capacity ([facility] only)    Standing Status:   Future    Number of Occurrences:   1    Standing Expiration Date:   03/10/2023   TSH    Standing Status:   Future    Standing Expiration Date:   03/17/2023   T4    Standing Status:   Future    Standing Expiration Date:   03/17/2023   CBC with Differential (Cancer Center Only)    Standing Status:   Future    Standing Expiration Date:   03/17/2023   CMP (Cancer Center only)    Standing Status:   Future    Standing Expiration Date:   03/17/2023   T4    Standing Status:   Future    Standing Expiration Date:   03/18/2023   TSH    Standing Status:   Future    Standing Expiration Date:   03/18/2023    Occult blood card to lab, stool    Standing Status:   Future    Standing Expiration Date:   03/10/2023   Occult blood card to lab, stool    Standing Status:   Future    Standing Expiration Date:   03/10/2023   Occult blood card to lab, stool    Standing Status:   Future    Standing Expiration Date:   03/10/2023    All questions were answered. The patient knows to call the clinic with any problems, questions or concerns.  I have spent a total of 30 minutes minutes of face-to-face and non-face-to-face time, preparing to see the patient, performing a medically appropriate examination, counseling and educating the patient, referring and communicating with other health care professionals, documenting clinical information in the electronic health record, and care coordination.   Dede Query PA-C Dept of Hematology and  [place] at [facility] Phone: [phone]    03/10/2022 2:26 PM

## 2022-03-10 NOTE — Progress Notes (Addendum)
CRITICAL VALUE STICKER  CRITICAL VALUE: HGB 6.4 g/dL   RECEIVER (on-site recipient of call): Arna Snipe RN  [place] NOTIFIED: 03/10/2022  10:14  MESSENGER (representative from lab): Loletta Parish  MD NOTIFIED: Dede Query PA, [facility] LPN  TIME OF NOTIFICATION: 10:15  RESPONSE:  Acknowledged information.   Repeat HGB  6.7 called from LAB at 11:11 AM - Pam Bimbo/ Lab Results provided to [place]. Dede Query PA

## 2022-03-10 NOTE — Patient Instructions (Signed)

## 2022-03-10 NOTE — Progress Notes (Signed)
Per Sherol Dade, PA-C, ok to run blood at 300 ml/hr due to time constraints.   Fecal occult test given to patient per request of [place], [place]. Patient was educated using teach back method of how to provide stool samples correctly.

## 2022-03-12 LAB — TYPE AND SCREEN
ABO/RH(D): B POS
Antibody Screen: NEGATIVE
Unit division: 0
Unit division: 0

## 2022-03-12 LAB — BPAM RBC
Blood Product Expiration Date: 202404032359
Blood Product Expiration Date: 202404032359
ISSUE DATE / TIME: 202403081245
ISSUE DATE / TIME: 202403081245
Unit Type and Rh: 7300
Unit Type and Rh: 7300

## 2022-03-12 LAB — T4: T4, Total: 6.9 ug/dL (ref 4.5–12.0)

## 2022-03-13 ENCOUNTER — Telehealth: Payer: Self-pay

## 2022-03-13 NOTE — Telephone Encounter (Signed)
T/C from pt stating his senna RX is not the same.He usually gets #150 and is short 90 pills also he received 8.6 mg tablet and was suppose to get 8.6-50 mg.    Spoke with Josh the pharmacist at [facility] and he said the pt was given an OTC because it was cheaper and his records showed pt was given #150.  Pt was advised to return the bottle to [facility] for review.  Pt has transportation issues and said it would be difficult for him to do that.

## 2022-03-16 ENCOUNTER — Inpatient Hospital Stay: Payer: Medicare HMO | Admitting: Hematology and Oncology

## 2022-03-16 ENCOUNTER — Other Ambulatory Visit: Payer: Self-pay

## 2022-03-16 ENCOUNTER — Inpatient Hospital Stay (HOSPITAL_BASED_OUTPATIENT_CLINIC_OR_DEPARTMENT_OTHER): Payer: Medicare HMO

## 2022-03-16 ENCOUNTER — Inpatient Hospital Stay: Payer: Medicare HMO

## 2022-03-16 VITALS — BP 106/69 | HR 88 | Temp 98.2°F | Resp 16 | Wt 145.2 lb

## 2022-03-16 DIAGNOSIS — Z5112 Encounter for antineoplastic immunotherapy: Secondary | ICD-10-CM | POA: Diagnosis not present

## 2022-03-16 DIAGNOSIS — C3412 Malignant neoplasm of upper lobe, left bronchus or lung: Secondary | ICD-10-CM | POA: Diagnosis not present

## 2022-03-16 DIAGNOSIS — Z95828 Presence of other vascular implants and grafts: Secondary | ICD-10-CM

## 2022-03-16 DIAGNOSIS — D509 Iron deficiency anemia, unspecified: Secondary | ICD-10-CM

## 2022-03-16 LAB — CBC WITH DIFFERENTIAL (CANCER CENTER ONLY)
Abs Immature Granulocytes: 0.09 10*3/uL — ABNORMAL HIGH (ref 0.00–0.07)
Basophils Absolute: 0.1 10*3/uL (ref 0.0–0.1)
Basophils Relative: 0 %
Eosinophils Absolute: 0.5 10*3/uL (ref 0.0–0.5)
Eosinophils Relative: 4 %
HCT: 28.8 % — ABNORMAL LOW (ref 39.0–52.0)
Hemoglobin: 9.4 g/dL — ABNORMAL LOW (ref 13.0–17.0)
Immature Granulocytes: 1 %
Lymphocytes Relative: 9 %
Lymphs Abs: 1 10*3/uL (ref 0.7–4.0)
MCH: 31.2 pg (ref 26.0–34.0)
MCHC: 32.6 g/dL (ref 30.0–36.0)
MCV: 95.7 fL (ref 80.0–100.0)
Monocytes Absolute: 1.8 10*3/uL — ABNORMAL HIGH (ref 0.1–1.0)
Monocytes Relative: 15 %
Neutro Abs: 8.7 10*3/uL — ABNORMAL HIGH (ref 1.7–7.7)
Neutrophils Relative %: 71 %
Platelet Count: 414 10*3/uL — ABNORMAL HIGH (ref 150–400)
RBC: 3.01 MIL/uL — ABNORMAL LOW (ref 4.22–5.81)
RDW: 19.5 % — ABNORMAL HIGH (ref 11.5–15.5)
WBC Count: 12.1 10*3/uL — ABNORMAL HIGH (ref 4.0–10.5)
nRBC: 0 % (ref 0.0–0.2)

## 2022-03-16 LAB — CMP (CANCER CENTER ONLY)
ALT: 29 U/L (ref 0–44)
AST: 33 U/L (ref 15–41)
Albumin: 3.2 g/dL — ABNORMAL LOW (ref 3.5–5.0)
Alkaline Phosphatase: 73 U/L (ref 38–126)
Anion gap: 7 (ref 5–15)
BUN: 20 mg/dL (ref 8–23)
CO2: 21 mmol/L — ABNORMAL LOW (ref 22–32)
Calcium: 9.3 mg/dL (ref 8.9–10.3)
Chloride: 100 mmol/L (ref 98–111)
Creatinine: 1.75 mg/dL — ABNORMAL HIGH (ref 0.61–1.24)
GFR, Estimated: 42 mL/min — ABNORMAL LOW (ref >60)
Glucose, Bld: 89 mg/dL (ref 70–99)
Potassium: 4.6 mmol/L (ref 3.5–5.1)
Sodium: 128 mmol/L — ABNORMAL LOW (ref 135–145)
Total Bilirubin: 0.3 mg/dL (ref 0.3–1.2)
Total Protein: 8.7 g/dL — ABNORMAL HIGH (ref 6.5–8.1)

## 2022-03-16 LAB — OCCULT BLOOD X 1 CARD TO LAB, STOOL
Fecal Occult Bld: NEGATIVE
Fecal Occult Bld: NEGATIVE
Fecal Occult Bld: NEGATIVE

## 2022-03-16 LAB — TSH: TSH: 5.863 u[IU]/mL — ABNORMAL HIGH (ref 0.350–4.500)

## 2022-03-16 MED ORDER — SODIUM CHLORIDE 0.9% FLUSH
10.0000 mL | Freq: Once | INTRAVENOUS | Status: AC
  Administered 2022-03-16: 10 mL

## 2022-03-16 MED ORDER — HEPARIN SOD (PORK) LOCK FLUSH 100 UNIT/ML IV SOLN
500.0000 [IU] | Freq: Once | INTRAVENOUS | Status: AC
  Administered 2022-03-16: 500 [IU]

## 2022-03-16 NOTE — Progress Notes (Signed)
[place] [phone]   [phone]  PROGRESS NOTE  Patient Care Team: [name], MD as PCP - General (Family Medicine)  Hematological/Oncological History # Metastatic Adenocarcinoma of the Lung  01/07/2020 : CT of the chest with contrast performed which showed interval enlargement of a spiculated nodule in the central left upper lobe measuring 1.2 x 1.0 cm and highly suspicious for primary lung malignancy 02/06/2020 :PET scan performed which showed a hypermetabolic irregular solid 1.3 cm left upper lobe pulmonary nodule compatible with malignancy without any other hypermetabolic lesions [id number] 04/19/2020:  SBRT to the lung lesion 04/28/2021: CT C/A/P showed multifocal lytic bone metastases are identified. Lesion within the spine of the left scapula and lesions involving bilateral iliac bones and left sacral wing. Establish care with Dr. Lorenso Courier  05/03/2021: biopsy of right iliac lytic lesion showed metastatic carcinoma, consistent with lung primary.  07/08/2021: Cycle 1 Day 1 of Carbo/Pem/Pem 07/29/2021: Cycle 2 Day 1 of Carbo/Pem/Pem 08/11/2021-08/24/2021: Received palliative radiation to osseous metastasis in the left shoulder. 30 Gy in 10 fractions.  08/19/2021: Cycle 3 Day 1 of Carbo/Pem/Pem 09/09/2021: Cycle 4 Day 1 of Carbo/Pem/Pem 09/23/2021: CT CAP: stable disease 09/30/2021: Cycle 5 Day 1 of Pem/Pem 10/21/2021: Cycle 6 Day 1 of Pembrolizumab. Held pemetrexed due to anemia. 11/16/2021: Cycle 7 Day 1 of Pem/Pem.   12/16/2021: Cycle 8 Day 1 of Pem/Pem.   01/06/2022: Cycle 9 Day 1 of Pem/Pem.  01/27/2022: Cycle 10 Day 1 of Pem/Pem 02/17/2022: Cycle 11 Day 1 of Pem/Pem 03/10/2022: Cycle 12 Day 1 of Pem/Pem (HELD due to anemia with Hgb of 6.4) 03/17/2022: Cycle 12 Day 1 of Pem/Pem (HELD pemextrexed due to cytopenias/kidney function)  #Adenocarcinoma of the Prostate, T1cN0M0 08/19/2019-10/07/2019: 70 Gy in 28 fractions of 2.5 Gy.  Radiation to the prostate was  under the care of Dr. Tyler Pita  Interval History:  Terry Page. 67 y.o. male with medical history significant for metastatic adenocarcinoma of the lung who presents for a follow up visit. The patient's last visit was on 03/10/2022. In the interim since the last visit he has held chemo due to severe anemia.   On exam today Terry Page reports he has been well overall in the interim since his last visit.  He is not having shortness of breath, lightheadedness, or dizziness.  He is not having any chest pain or headache.  He reports overall he feels normal, even when his hemoglobin was less than 7.  He reports no overt signs of bleeding such as dark stools or bright red blood in the stool/urine.  He reports that he received 2 units of packed red blood cells last week with no issues.  Overall he feels quite well.  He notes that he is tolerating chemotherapy quite well other than feeling some sluggish in the afternoon afterwards.  He is not having any difficulty with nausea, vomiting, or diarrhea.  He reports overall he has been eating well and his weight has been steady.[name] He denies fevers, chills,sweats, shortness of breath, chest pain, cough, headaches, dizziness or syncope. He has no other complaints. A full 10 point ROS is listed below.  Overall he is willing and able to proceed with chemotherapy at this time.  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 Gery Pray   Incidental lung nodule, greater than or equal to 34mm 10/22/2017   Left upper lobe - discovered on CTA   Prostate cancer ([place])    Spontaneous pneumothorax 10/20/2017   right   Tobacco abuse     SURGICAL HISTORY: Past Surgical History:  Procedure Laterality Date   BACK SURGERY     IR IMAGING GUIDED PORT INSERTION  06/29/2021    PROSTATE BIOPSY      SOCIAL HISTORY: Social History   Socioeconomic History   Marital status: Single    Spouse name: Not on file   Number of children: Not on file   Years of education: Not on file   Highest education level: Not on file  Occupational History   Occupation: retired  Tobacco Use   Smoking status: Former    Types: Cigarettes    Quit date: 11/22/2019    Years since quitting: 2.3   Smokeless tobacco: Never   Tobacco comments:    Patient reports quit 3 years ago. 06/25/20. [facility]  Vaping Use   Vaping Use: Never used  Substance and Sexual Activity   Alcohol use: Not Currently    Alcohol/week: 12.0 standard drinks of alcohol    Types: 12 Cans of beer per week    Comment: daily   Drug use: No   Sexual activity: Not Currently  Other Topics Concern   Not on file  Social History Narrative   Not on file   Social Determinants of Health   Financial Resource Strain: Not on file  Food Insecurity: Not on file  Transportation Needs: Unmet Transportation Needs (07/18/2021)   PRAPARE - Hydrologist (Medical): Yes    Lack of Transportation (Non-Medical): Yes  Physical Activity: Not on file  Stress: Not on file  Social Connections: Not on file  Intimate Partner Violence: Not on file    FAMILY HISTORY: Family History  Problem Relation Age of Onset   Cancer Cousin        maternal cousin   Cancer Cousin        paternal cousin   Cancer Cousin    Colon polyps Neg Hx    Pancreatic disease Neg Hx    Pancreatic cancer Neg Hx    Breast cancer Neg Hx    Colon cancer Neg Hx     ALLERGIES:  is allergic to morphine and oxycodone.  MEDICATIONS:  Current Outpatient Medications  Medication Sig Dispense Refill   acetaminophen (TYLENOL) 325 MG tablet Take 2 tablets (650 mg total) by mouth every 6 (six) hours as needed for mild pain or headache (fever >/= 101).     albuterol (VENTOLIN HFA) 108 (90 Base) MCG/ACT inhaler Inhale 2 puffs into the lungs  every 6 (six) hours as needed for wheezing or shortness of breath. 8 g 1   amLODipine (NORVASC) 10 MG tablet Take 1 tablet by mouth daily.     atorvastatin (LIPITOR) 20 MG tablet Take 20 mg by mouth daily.     cyanocobalamin (VITAMIN B12) 1000 MCG tablet Take 1 tablet (1,000 mcg total) by mouth daily. 90 tablet 1   ferrous sulfate 325 (65 FE) MG EC tablet Take 1 tablet (325 mg total) by mouth daily with breakfast. 30 tablet 3   folic acid (FOLVITE) 1 MG tablet Take 1 tablet (1 mg total) by mouth daily. 30 tablet 3   hydrOXYzine (ATARAX) 25 MG tablet Take 1 tablet (25 mg total) by mouth at bedtime as  needed for itching. 30 tablet 11   ipratropium-albuterol (DUONEB) 0.5-2.5 (3) MG/3ML SOLN Take 3 mLs by nebulization every 6 (six) hours as needed. (Patient taking differently: Take 3 mLs by nebulization every 6 (six) hours as needed (shortness of breathing or wheezing).) 360 mL 0   lidocaine-prilocaine (EMLA) cream Apply 1 Application topically as needed. 30 g 0   losartan (COZAAR) 100 MG tablet Take 100 mg by mouth daily.     olmesartan (BENICAR) 40 MG tablet Take 40 mg by mouth daily.     ondansetron (ZOFRAN) 8 MG tablet Take 1 tablet (8 mg total) by mouth every 8 (eight) hours as needed. 30 tablet 0   potassium chloride SA (KLOR-CON M) 20 MEQ tablet TAKE 1 TABLET(20 MEQ) BY MOUTH DAILY 90 tablet 0   prochlorperazine (COMPAZINE) 10 MG tablet Take 1 tablet (10 mg total) by mouth every 6 (six) hours as needed for nausea or vomiting. 30 tablet 0   senna-docusate (STIMULANT LAXATIVE) 8.6-50 MG tablet Take 2 tablets by mouth 2 (two) times daily. May add additional tablet as needed 150 tablet 3   thiamine 100 MG tablet Take 1 tablet (100 mg total) by mouth daily.     lidocaine (LIDODERM) 5 % Place 1 patch onto the skin daily. Remove & Discard patch within 12 hours or as directed by MD. Apply to low back (Patient not taking: Reported on 02/17/2022) 30 patch 0   polyethylene glycol (MIRALAX) 17 g packet Take  17 g by mouth daily. (Patient not taking: Reported on 02/17/2022) 30 each 2   traMADol (ULTRAM) 50 MG tablet Take 1-2 tablets (50-100 mg total) by mouth every 6 (six) hours as needed. 90 tablet 0   No current facility-administered medications for this visit.    REVIEW OF SYSTEMS:   Constitutional: ( - ) fevers, ( - )  chills , ( - ) night sweats Eyes: ( - ) blurriness of vision, ( - ) double vision, ( - ) watery eyes Ears, nose, mouth, throat, and face: ( - ) mucositis, ( - ) sore throat Respiratory: ( - ) cough, ( - ) dyspnea, ( - ) wheezes Cardiovascular: ( - ) palpitation, ( - ) chest discomfort, ( - ) lower extremity swelling Gastrointestinal:  ( - ) nausea, ( - ) heartburn, ( - ) change in bowel habits Skin: ( - ) abnormal skin rashes Lymphatics: ( - ) new lymphadenopathy, ( - ) easy bruising Neurological: ( - ) numbness, ( - ) tingling, ( - ) new weaknesses Behavioral/Psych: ( - ) mood change, ( - ) new changes  All other systems were reviewed with the patient and are negative.  PHYSICAL EXAMINATION: ECOG PERFORMANCE STATUS: 1 - Symptomatic but completely ambulatory  Vitals:   03/16/22 0942  BP: 106/69  Pulse: 88  Resp: 16  Temp: 98.2 F (36.8 C)  SpO2: 100%    Filed Weights   03/16/22 0942  Weight: 145 lb 3.2 oz (65.9 kg)     GENERAL: Well-appearing elderly African-American male, alert, no distress and comfortable SKIN: skin color, texture, turgor are normal, no rashes or significant lesions EYES: conjunctiva are pink and non-injected, sclera clear LUNGS: clear to auscultation and percussion with normal breathing effort HEART: regular rate & rhythm and no murmurs and no lower extremity edema Musculoskeletal: no cyanosis of digits and no clubbing  PSYCH: alert & oriented x 3, fluent speech NEURO: no focal motor/sensory deficits  LABORATORY DATA:  I have reviewed the data as  listed    Latest Ref Rng & Units 03/16/2022    9:01 AM 03/10/2022   11:00 AM 03/10/2022     9:50 AM  CBC  WBC 4.0 - 10.5 K/uL 12.1  11.4  11.3   Hemoglobin 13.0 - 17.0 g/dL 9.4  6.7  6.4   Hematocrit 39.0 - 52.0 % 28.8  20.1  19.6   Platelets 150 - 400 K/uL 414  433  417        Latest Ref Rng & Units 03/16/2022    9:01 AM 03/10/2022    9:50 AM 02/17/2022    8:14 AM  CMP  Glucose 70 - 99 mg/dL 89  102  112   BUN 8 - 23 mg/dL 20  24  19    Creatinine 0.61 - 1.24 mg/dL 1.75  2.13  1.04   Sodium 135 - 145 mmol/L 128  126  134   Potassium 3.5 - 5.1 mmol/L 4.6  4.5  4.5   Chloride 98 - 111 mmol/L 100  99  106   CO2 22 - 32 mmol/L 21  18  20    Calcium 8.9 - 10.3 mg/dL 9.3  8.4  9.1   Total Protein 6.5 - 8.1 g/dL 8.7  8.4  8.0   Total Bilirubin 0.3 - 1.2 mg/dL 0.3  0.2  0.3   Alkaline Phos 38 - 126 U/L 73  66  82   AST 15 - 41 U/L 33  39  22   ALT 0 - 44 U/L 29  34  16     Lab Results  Component Value Date   MPROTEIN Not Observed 04/28/2021   Lab Results  Component Value Date   KPAFRELGTCHN 33.6 (H) 04/28/2021   LAMBDASER 22.0 04/28/2021   KAPLAMBRATIO 1.53 04/28/2021    RADIOGRAPHIC STUDIES: CT CHEST ABDOMEN PELVIS W CONTRAST  Result Date: 03/07/2022 CLINICAL DATA:  History of metastatic non-small cell lung cancer status post radiation therapy (completed in 2023) undergoing ongoing chemotherapy. Additional history of prostate cancer diagnosed in 2019. Follow-up study. * Tracking Code: BO * EXAM: CT CHEST, ABDOMEN, AND PELVIS WITH CONTRAST TECHNIQUE: Multidetector CT imaging of the chest, abdomen and pelvis was performed following the standard protocol during bolus administration of intravenous contrast. RADIATION DOSE REDUCTION: This exam was performed according to the departmental dose-optimization program which includes automated exposure control, adjustment of the mA and/or kV according to patient size and/or use of iterative reconstruction technique. CONTRAST:  157mL OMNIPAQUE IOHEXOL 300 MG/ML  SOLN COMPARISON:  Multiple priors, most recently CT of the chest, abdomen and  pelvis 12/08/2021. FINDINGS: CT CHEST FINDINGS Cardiovascular: Heart size is normal. There is no significant pericardial fluid, thickening or pericardial calcification. There is aortic atherosclerosis, as well as atherosclerosis of the great vessels of the mediastinum and the coronary arteries, including calcified atherosclerotic plaque in the left main, left anterior descending, left circumflex and right coronary arteries. Right internal jugular single-lumen Port-A-Cath with tip terminating in the superior aspect of the right atrium. Mediastinum/Nodes: No pathologically enlarged mediastinal or hilar lymph nodes. Esophagus is unremarkable in appearance. No axillary lymphadenopathy. Lungs/Pleura: Chronic nodular and mass-like areas of architectural distortion in the left lung appear similar to the prior study, most compatible with areas of chronic postradiation mass-like fibrosis, most notable in the posterior aspect of the left upper lobe adjacent to the left hilum on axial image 67 of series 4. More superiorly in the left lung there are areas of interstitial prominence and patchy ill-defined opacities and  areas of increasing architectural distortion, most likely to reflect evolving postradiation related to prior radiation therapy to the left scapula (reportedly completed in late August 2023). No other new suspicious appearing pulmonary nodules or masses are noted. No pleural effusions. Diffuse bronchial wall thickening with moderate centrilobular and severe paraseptal emphysema, including extensive bullous changes in the left upper lobe. Musculoskeletal: Areas of sclerosis and lucency in the left scapula, similar to the prior study, presumably reflective of treated metastatic disease. No new aggressive appearing lytic or blastic lesions are otherwise noted in the visualized portions of the axial or appendicular skeleton. CT ABDOMEN PELVIS FINDINGS Hepatobiliary: Multiple well-defined low-attenuation lesions  scattered throughout the liver, similar in size and number compared to the prior examination, compatible with cysts and/or biliary hamartomas (no imaging follow-up recommended), largest of which is in the superior aspect of segment 4A (axial image 53 of series 2) measuring 1.9 x 1.3 cm. No other aggressive appearing hepatic lesions are noted. No intra or extrahepatic biliary ductal dilatation. Gallbladder is unremarkable in appearance. Pancreas: No pancreatic mass. No pancreatic ductal dilatation. No pancreatic or peripancreatic fluid collections or inflammatory changes. Spleen: Spleen is atrophic with lobular contours (unchanged), but otherwise unremarkable in appearance. Adrenals/Urinary Tract: Multiple well-defined low-attenuation lesions in both kidneys again noted, compatible with simple cysts (no imaging follow-up recommended), largest of which measure up to 2.3 x 1.7 cm in the posterior aspect of the interpolar region of the right kidney. No aggressive appearing renal lesions are noted. No hydroureteronephrosis. Urinary bladder is normal in appearance. Bilateral adrenal glands are normal in appearance. Stomach/Bowel: The appearance of the stomach is normal. There is no pathologic dilatation of small bowel or colon. Normal appendix. Vascular/Lymphatic: Extensive aortic atherosclerosis, without evidence of aneurysm or dissection in the abdominal or pelvic vasculature. No lymphadenopathy noted in the abdomen or pelvis. Reproductive: Prostate gland and seminal vesicles are unremarkable in appearance. Other: No significant volume of ascites.  No pneumoperitoneum. Musculoskeletal: Widespread areas of sclerosis are again noted in the visualized skeleton, most evident throughout the pelvis, most compatible with treated metastatic lesions. No new osseous lesions are otherwise noted. IMPRESSION: 1. Generally stable examination demonstrating multiple osseous metastases, which appear grossly similar to the prior study.  No new metastatic lesions to the bones. No new extra skeletal metastatic lesions confidently identified in the chest, abdomen or pelvis. 2. There is what appears to be evolving postradiation changes in the superior aspect of the left upper lobe adjacent to the treated left scapular lesion. Continued attention on follow-up studies is recommended to ensure the consolidation and contraction of these postradiation changes. 3. Diffuse bronchial wall thickening with moderate centrilobular and severe paraseptal emphysema; imaging findings suggestive of underlying COPD. 4. Aortic atherosclerosis, in addition to left main and three-vessel coronary artery disease. Please note that although the presence of coronary artery calcium documents the presence of coronary artery disease, the severity of this disease and any potential stenosis cannot be assessed on this non-gated CT examination. Assessment for potential risk factor modification, dietary therapy or pharmacologic therapy may be warranted, if clinically indicated. 5. Additional incidental findings, as above, similar to prior studies. Electronically Signed   By: Vinnie Langton M.D.   On: 03/07/2022 11:42    ASSESSMENT & PLAN Terry Page. 67 y.o. male with medical history significant for newly diagnosed metastatic adenocarcinoma of the lung who presents for a follow up visit.  # Metastatic Adenocarcinoma of the Lung  -- NGS testing from this patient shows no  evidence of targetable mutation. --MRI brain shows no evidence of intracranial metastases --Started carboplatin, pembrolizumab, and pemetrexed as treatment for his cancer on 07/08/2021. --Received palliative radiation to osseous metastasis of the left shoulder from 08/11/2021-08/24/2021. Planned dose is 30 Gy in 10 fx.  Plan: --today is Day 1 Cycle 12 of Pem/Pem for metastatic adenocarcinoma of the lung --labs from today were reviewed. Labs show white blood cell count 12.1, hemoglobin 9.4, MCV 95.7,  and platelets of 414 --CT C/A/P from 03/07/2022 showed stable disease. Next CT CAP due in June 2024.  --RTC in 3 weeks with labs, follow up visit to assess for Cycle 13 of chemotherapy  #Acute anemia: --Hgb level is 9.4 today, up from 6.7 on 03/10/2022.  --Iron panel shows deficiency with serum iron 43, saturation 16%, ferritin levels pending --Recommend to start ferrous sulfate 325 mg PO daily --Gave stool cards to evaluate for GI bleeding, patient will drop off next week  #Left shoulder pain: --Secondary to left scapular metastasis.  --Most recent CT from 09/23/2021 showed stable osseous disease  #Pain Control --stopped oxycodone due to itching --currently following with Palliative care.  --pain well controlled with tramadol as needed. Continue to take tramdol 50-100 mg q 6 hours as needed --Continue senna docusate for constipation prophylaxis --Continue to monitor  #Supportive Care -- chemotherapy education complete -- port placed -- zofran 8mg  q8H PRN and compazine 10mg  PO q6H for nausea -- EMLA cream for port -- Patient referred to palliative care for pain control. Pain control as above.    No orders of the defined types were placed in this encounter.   All questions were answered. The patient knows to call the clinic with any problems, questions or concerns.  I have spent a total of 30 minutes minutes of face-to-face and non-face-to-face time, preparing to see the patient, performing a medically appropriate examination, counseling and educating the patient, referring and communicating with other health care professionals, documenting clinical information in the electronic health record, and care coordination.   Ledell Peoples, MD Department of Hematology/Oncology [place] [facility] Phone: [phone] Pager: [phone] Email: [email or web address]  03/27/2022 3:34 PM

## 2022-03-16 NOTE — Progress Notes (Signed)
Per Dr. Lorenso Courier: okay to treat with Creatinine of 1.75

## 2022-03-17 ENCOUNTER — Inpatient Hospital Stay: Payer: Medicare HMO

## 2022-03-17 ENCOUNTER — Other Ambulatory Visit: Payer: Self-pay | Admitting: Hematology and Oncology

## 2022-03-17 VITALS — BP 109/75 | HR 87 | Temp 98.5°F | Resp 16

## 2022-03-17 DIAGNOSIS — C3412 Malignant neoplasm of upper lobe, left bronchus or lung: Secondary | ICD-10-CM

## 2022-03-17 DIAGNOSIS — Z5112 Encounter for antineoplastic immunotherapy: Secondary | ICD-10-CM | POA: Diagnosis not present

## 2022-03-17 LAB — T4: T4, Total: 6.5 ug/dL (ref 4.5–12.0)

## 2022-03-17 MED ORDER — HEPARIN SOD (PORK) LOCK FLUSH 100 UNIT/ML IV SOLN
500.0000 [IU] | Freq: Once | INTRAVENOUS | Status: AC | PRN
  Administered 2022-03-17: 500 [IU]

## 2022-03-17 MED ORDER — SODIUM CHLORIDE 0.9 % IV SOLN: 500.0000 mg/m2 | Freq: Once | INTRAVENOUS | Status: DC

## 2022-03-17 MED ORDER — SODIUM CHLORIDE 0.9 % IV SOLN
200.0000 mg | Freq: Once | INTRAVENOUS | Status: AC
  Administered 2022-03-17: 200 mg via INTRAVENOUS
  Filled 2022-03-17: qty 8

## 2022-03-17 MED ORDER — CYANOCOBALAMIN 1000 MCG/ML IJ SOLN: 1000.0000 ug | Freq: Once | INTRAMUSCULAR | Status: DC

## 2022-03-17 MED ORDER — SODIUM CHLORIDE 0.9% FLUSH
10.0000 mL | INTRAVENOUS | Status: DC | PRN
  Administered 2022-03-17: 10 mL

## 2022-03-17 MED ORDER — SODIUM CHLORIDE 0.9 % IV SOLN: Freq: Once | INTRAVENOUS | Status: AC

## 2022-03-17 MED ORDER — PROCHLORPERAZINE MALEATE 10 MG PO TABS
10.0000 mg | ORAL_TABLET | Freq: Once | ORAL | Status: AC
  Administered 2022-03-17: 10 mg via ORAL
  Filled 2022-03-17: qty 1

## 2022-03-17 NOTE — Patient Instructions (Signed)
[place]   Discharge Instructions: Thank you for choosing [facility] to provide your oncology and hematology care.   If you have a lab appointment with the [facility], please go directly to the [facility] and check in at the registration area.   Wear comfortable clothing and clothing appropriate for easy access to any Portacath or PICC line.   We strive to give you quality time with your provider. You may need to reschedule your appointment if you arrive late (15 or more minutes).  Arriving late affects you and other patients whose appointments are after yours.  Also, if you miss three or more appointments without notifying the office, you may be dismissed from the clinic at the provider's discretion.      For prescription refill requests, have your pharmacy contact our office and allow 72 hours for refills to be completed.    Today you received the following chemotherapy and/or immunotherapy agents Pemetrexed (ALIMTA) & Pembrolizumab (KEYTRUDA).      To help prevent nausea and vomiting after your treatment, we encourage you to take your nausea medication as directed.  BELOW ARE SYMPTOMS THAT SHOULD BE REPORTED IMMEDIATELY: *FEVER GREATER THAN 100.4 F (38 C) OR HIGHER *CHILLS OR SWEATING *NAUSEA AND VOMITING THAT IS NOT CONTROLLED WITH YOUR NAUSEA MEDICATION *UNUSUAL SHORTNESS OF BREATH *UNUSUAL BRUISING OR BLEEDING *URINARY PROBLEMS (pain or burning when urinating, or frequent urination) *BOWEL PROBLEMS (unusual diarrhea, constipation, pain near the anus) TENDERNESS IN MOUTH AND THROAT WITH OR WITHOUT PRESENCE OF ULCERS (sore throat, sores in mouth, or a toothache) UNUSUAL RASH, SWELLING OR PAIN  UNUSUAL VAGINAL DISCHARGE OR ITCHING   Items with * indicate a potential emergency and should be followed up as soon as possible or go to the Emergency Department if any problems should occur.  Please show the CHEMOTHERAPY ALERT CARD  or IMMUNOTHERAPY ALERT CARD at check-in to the Emergency Department and triage nurse.  Should you have questions after your visit or need to cancel or reschedule your appointment, please contact [facility]  Dept: [phone]  and follow the prompts.  Office hours are 8:00 a.m. to 4:30 p.m. Monday - Friday. Please note that voicemails left after 4:00 p.m. may not be returned until the following business day.  We are closed weekends and major holidays. You have access to a nurse at all times for urgent questions. Please call the main number to the clinic Dept: [phone] and follow the prompts.   For any non-urgent questions, you may also contact your provider using MyChart. We now offer e-Visits for anyone 28 and older to request care online for non-urgent symptoms. For details visit [email or web address].   Also download the MyChart app! Go to the app store, search "MyChart", open the app, select [employer], and log in with your MyChart username and password.  Pemetrexed Injection What is this medication? PEMETREXED (PEM e TREX ed) treats some types of cancer. It works by slowing down the growth of cancer cells. This medicine may be used for other purposes; ask your health care provider or pharmacist if you have questions. COMMON BRAND NAME(S): Alimta, PEMFEXY What should I tell my care team before I take this medication? They need to know if you have any of these conditions: Infection, such as chickenpox, cold sores, or herpes Kidney disease Low blood cell levels (white cells, red cells, and platelets) Lung or breathing disease, such as asthma Radiation therapy An  unusual or allergic reaction to pemetrexed, other medications, foods, dyes, or preservatives If you or your partner are pregnant or trying to get pregnant Breast-feeding How should I use this medication? This medication is injected into a vein. It is given by your care team in a hospital  or clinic setting. Talk to your care team about the use of this medication in children. Special care may be needed. Overdosage: If you think you have taken too much of this medicine contact a poison control center or emergency room at once. NOTE: This medicine is only for you. Do not share this medicine with others. What if I miss a dose? Keep appointments for follow-up doses. It is important not to miss your dose. Call your care team if you are unable to keep an appointment. What may interact with this medication? Do not take this medication with any of the following: Live virus vaccines This medication may also interact with the following: Ibuprofen This list may not describe all possible interactions. Give your health care provider a list of all the medicines, herbs, non-prescription drugs, or dietary supplements you use. Also tell them if you smoke, drink alcohol, or use illegal drugs. Some items may interact with your medicine. What should I watch for while using this medication? Your condition will be monitored carefully while you are receiving this medication. This medication may make you feel generally unwell. This is not uncommon as chemotherapy can affect healthy cells as well as cancer cells. Report any side effects. Continue your course of treatment even though you feel ill unless your care team tells you to stop. This medication can cause serious side effects. To reduce the risk, your care team may give you other medications to take before receiving this one. Be sure to follow the directions from your care team. This medication can cause a rash or redness in areas of the body that have previously had radiation therapy. If you have had radiation therapy, tell your care team if you notice a rash in this area. This medication may increase your risk of getting an infection. Call your care team for advice if you get a fever, chills, sore throat, or other symptoms of a cold or flu. Do not  treat yourself. Try to avoid being around people who are sick. Be careful brushing or flossing your teeth or using a toothpick because you may get an infection or bleed more easily. If you have any dental work done, tell your dentist you are receiving this medication. Avoid taking medications that contain aspirin, acetaminophen, ibuprofen, naproxen, or ketoprofen unless instructed by your care team. These medications may hide a fever. Check with your care team if you have severe diarrhea, nausea, and vomiting, or if you sweat a lot. The loss of too much body fluid may make it dangerous for you to take this medication. Talk to your care team if you or your partner wish to become pregnant or think either of you might be pregnant. This medication can cause serious birth defects if taken during pregnancy and for 6 months after the last dose. A negative pregnancy test is required before starting this medication. A reliable form of contraception is recommended while taking this medication and for 6 months after the last dose. Talk to your care team about reliable forms of contraception. Do not father a child while taking this medication and for 3 months after the last dose. Use a condom while having sex during this time period. Do not breastfeed  while taking this medication and for 1 week after the last dose. This medication may cause infertility. Talk to your care team if you are concerned about your fertility. What side effects may I notice from receiving this medication? Side effects that you should report to your care team as soon as possible: Allergic reactions--skin rash, itching, hives, swelling of the face, lips, tongue, or throat Dry cough, shortness of breath or trouble breathing Infection--fever, chills, cough, sore throat, wounds that don't heal, pain or trouble when passing urine, general feeling of discomfort or being unwell Kidney injury--decrease in the amount of urine, swelling of the ankles,  hands, or feet Low red blood cell level--unusual weakness or fatigue, dizziness, headache, trouble breathing Redness, blistering, peeling, or loosening of the skin, including inside the mouth Unusual bruising or bleeding Side effects that usually do not require medical attention (report to your care team if they continue or are bothersome): Fatigue Loss of appetite Nausea Vomiting This list may not describe all possible side effects. Call your doctor for medical advice about side effects. You may report side effects to FDA at 1-800-FDA-1088. Where should I keep my medication? This medication is given in a hospital or clinic. It will not be stored at home. NOTE: This sheet is a summary. It may not cover all possible information. If you have questions about this medicine, talk to your doctor, pharmacist, or health care provider.  2023 Elsevier/Gold Standard (2021-04-25 00:00:00)  Pembrolizumab Injection What is this medication? PEMBROLIZUMAB (PEM broe LIZ ue mab) treats some types of cancer. It works by helping your immune system slow or stop the spread of cancer cells. It is a monoclonal antibody. This medicine may be used for other purposes; ask your health care provider or pharmacist if you have questions. COMMON BRAND NAME(S): Keytruda What should I tell my care team before I take this medication? They need to know if you have any of these conditions: Allogeneic stem cell transplant (uses someone else's stem cells) Autoimmune diseases, such as Crohn disease, ulcerative colitis, lupus History of chest radiation Nervous system problems, such as Guillain-Barre syndrome, myasthenia gravis Organ transplant An unusual or allergic reaction to pembrolizumab, other medications, foods, dyes, or preservatives Pregnant or trying to get pregnant Breast-feeding How should I use this medication? This medication is injected into a vein. It is given by your care team in a hospital or clinic  setting. A special MedGuide will be given to you before each treatment. Be sure to read this information carefully each time. Talk to your care team about the use of this medication in children. While it may be prescribed for children as young as 6 months for selected conditions, precautions do apply. Overdosage: If you think you have taken too much of this medicine contact a poison control center or emergency room at once. NOTE: This medicine is only for you. Do not share this medicine with others. What if I miss a dose? Keep appointments for follow-up doses. It is important not to miss your dose. Call your care team if you are unable to keep an appointment. What may interact with this medication? Interactions have not been studied. This list may not describe all possible interactions. Give your health care provider a list of all the medicines, herbs, non-prescription drugs, or dietary supplements you use. Also tell them if you smoke, drink alcohol, or use illegal drugs. Some items may interact with your medicine. What should I watch for while using this medication? Your condition will  be monitored carefully while you are receiving this medication. You may need blood work while taking this medication. This medication may cause serious skin reactions. They can happen weeks to months after starting the medication. Contact your care team right away if you notice fevers or flu-like symptoms with a rash. The rash may be red or purple and then turn into blisters or peeling of the skin. You may also notice a red rash with swelling of the face, lips, or lymph nodes in your neck or under your arms. Tell your care team right away if you have any change in your eyesight. Talk to your care team if you may be pregnant. Serious birth defects can occur if you take this medication during pregnancy and for 4 months after the last dose. You will need a negative pregnancy test before starting this medication.  Contraception is recommended while taking this medication and for 4 months after the last dose. Your care team can help you find the option that works for you. Do not breastfeed while taking this medication and for 4 months after the last dose. What side effects may I notice from receiving this medication? Side effects that you should report to your care team as soon as possible: Allergic reactions--skin rash, itching, hives, swelling of the face, lips, tongue, or throat Dry cough, shortness of breath or trouble breathing Eye pain, redness, irritation, or discharge with blurry or decreased vision Heart muscle inflammation--unusual weakness or fatigue, shortness of breath, chest pain, fast or irregular heartbeat, dizziness, swelling of the ankles, feet, or hands Hormone gland problems--headache, sensitivity to light, unusual weakness or fatigue, dizziness, fast or irregular heartbeat, increased sensitivity to cold or heat, excessive sweating, constipation, hair loss, increased thirst or amount of urine, tremors or shaking, irritability Infusion reactions--chest pain, shortness of breath or trouble breathing, feeling faint or lightheaded Kidney injury (glomerulonephritis)--decrease in the amount of urine, red or dark brown urine, foamy or bubbly urine, swelling of the ankles, hands, or feet Liver injury--right upper belly pain, loss of appetite, nausea, light-colored stool, dark yellow or brown urine, yellowing skin or eyes, unusual weakness or fatigue Pain, tingling, or numbness in the hands or feet, muscle weakness, change in vision, confusion or trouble speaking, loss of balance or coordination, trouble walking, seizures Rash, fever, and swollen lymph nodes Redness, blistering, peeling, or loosening of the skin, including inside the mouth Sudden or severe stomach pain, bloody diarrhea, fever, nausea, vomiting Side effects that usually do not require medical attention (report to your care team if  they continue or are bothersome): Bone, joint, or muscle pain Diarrhea Fatigue Loss of appetite Nausea Skin rash This list may not describe all possible side effects. Call your doctor for medical advice about side effects. You may report side effects to FDA at 1-800-FDA-1088. Where should I keep my medication? This medication is given in a hospital or clinic. It will not be stored at home. NOTE: This sheet is a summary. It may not cover all possible information. If you have questions about this medicine, talk to your doctor, pharmacist, or health care provider.  2023 Elsevier/Gold Standard (2021-05-03 00:00:00)

## 2022-03-17 NOTE — Progress Notes (Signed)
CrCl = 37 mL/min.  Hold Alimta today per Dr. Lorenso Courier.  Kennith Center, Pharm.D., CPP 03/17/2022@9 :31 AM

## 2022-03-20 ENCOUNTER — Telehealth: Payer: Self-pay | Admitting: Hematology and Oncology

## 2022-03-20 ENCOUNTER — Telehealth: Payer: Self-pay

## 2022-03-20 NOTE — Telephone Encounter (Signed)
Called patient per 3/18 IB message to confirm appointments. Patient notified.

## 2022-03-20 NOTE — Telephone Encounter (Signed)
notify patient that stool cards are negative for blood in stool   IT  Pt advised with VU

## 2022-03-27 ENCOUNTER — Encounter: Payer: Self-pay | Admitting: Hematology and Oncology

## 2022-03-27 ENCOUNTER — Other Ambulatory Visit: Payer: Self-pay | Admitting: Nurse Practitioner

## 2022-03-27 DIAGNOSIS — Z515 Encounter for palliative care: Secondary | ICD-10-CM

## 2022-03-27 DIAGNOSIS — G893 Neoplasm related pain (acute) (chronic): Secondary | ICD-10-CM

## 2022-03-27 DIAGNOSIS — C7951 Secondary malignant neoplasm of bone: Secondary | ICD-10-CM

## 2022-03-27 MED ORDER — TRAMADOL HCL 50 MG PO TABS: 50.0000 mg | ORAL_TABLET | Freq: Four times a day (QID) | ORAL | 0 refills | Status: DC | PRN

## 2022-03-29 ENCOUNTER — Other Ambulatory Visit: Payer: Self-pay | Admitting: Hematology and Oncology

## 2022-03-29 ENCOUNTER — Telehealth: Payer: Self-pay | Admitting: Hematology and Oncology

## 2022-03-29 NOTE — Telephone Encounter (Signed)
Per WQ reached out to patient to schedule; patient aware of date and time of appointments.

## 2022-03-31 ENCOUNTER — Ambulatory Visit: Payer: Medicare HMO | Admitting: Physician Assistant

## 2022-03-31 ENCOUNTER — Ambulatory Visit: Payer: Medicare HMO

## 2022-03-31 ENCOUNTER — Other Ambulatory Visit: Payer: Medicare HMO

## 2022-04-06 NOTE — Progress Notes (Deleted)
i    [facility]  [phone] [phone]   Name: Terry Page. Date: 04/06/2022 MRN: [id number]  DOB: Oct 09, 1955  Patient Care Team: [name], MD as PCP - General (Family Medicine)   INTERVAL HISTORY: Terry Page. is a 67 y.o. 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  Palliative ask to see for symptom management.  SOCIAL HISTORY:     reports that he quit smoking about 2 years ago. His smoking use included cigarettes. He has never used smokeless tobacco. He reports that he does not currently use alcohol after a past usage of about 12.0 standard drinks of alcohol per week. He reports that he does not use drugs.  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 Gery Pray   Incidental lung nodule, greater than or equal to 10mm 10/22/2017   Left upper lobe - discovered on CTA   Prostate cancer ([place])    Spontaneous pneumothorax 10/20/2017   right   Tobacco abuse     ALLERGIES:  is allergic to morphine and oxycodone.  MEDICATIONS:  Current Outpatient Medications  Medication Sig Dispense Refill   acetaminophen (TYLENOL) 325 MG tablet Take 2 tablets (650 mg total) by mouth every 6 (six) hours as needed for mild pain or headache (fever >/= 101).     albuterol (VENTOLIN HFA) 108 (90 Base) MCG/ACT inhaler Inhale 2 puffs into the lungs every 6 (six) hours as needed for wheezing or shortness of breath. 8 g 1   amLODipine (NORVASC) 10 MG tablet Take 1 tablet by mouth daily.     atorvastatin (LIPITOR) 20 MG tablet Take 20  mg by mouth daily.     cyanocobalamin (VITAMIN B12) 1000 MCG tablet Take 1 tablet (1,000 mcg total) by mouth daily. 90 tablet 1   ferrous sulfate 325 (65 FE) MG EC tablet Take 1 tablet (325 mg total) by mouth daily with breakfast. 30 tablet 3   folic acid (FOLVITE) 1 MG tablet Take 1 tablet (1 mg total) by mouth daily. 30 tablet 3   hydrOXYzine (ATARAX) 25 MG tablet Take 1 tablet (25 mg total) by mouth at bedtime as needed for itching. 30 tablet 11   ipratropium-albuterol (DUONEB) 0.5-2.5 (3) MG/3ML SOLN Take 3 mLs by nebulization every 6 (six) hours as needed. (Patient taking differently: Take 3 mLs by nebulization every 6 (six) hours as needed (shortness of breathing or wheezing).) 360 mL 0   lidocaine (LIDODERM) 5 % Place 1 patch onto the skin daily. Remove & Discard patch within 12 hours or as directed by MD. Apply to low back (Patient not taking: Reported on 02/17/2022) 30 patch 0   lidocaine-prilocaine (EMLA) cream Apply 1 Application topically as needed. 30 g 0   losartan (COZAAR) 100 MG tablet Take 100 mg by mouth daily.     olmesartan (BENICAR) 40 MG tablet Take 40 mg by mouth daily.     ondansetron (ZOFRAN) 8 MG tablet Take 1 tablet (8 mg total) by mouth every 8 (eight) hours as needed. 30 tablet 0   polyethylene  glycol (MIRALAX) 17 g packet Take 17 g by mouth daily. (Patient not taking: Reported on 02/17/2022) 30 each 2   potassium chloride SA (KLOR-CON M) 20 MEQ tablet TAKE 1 TABLET(20 MEQ) BY MOUTH DAILY 90 tablet 0   prochlorperazine (COMPAZINE) 10 MG tablet Take 1 tablet (10 mg total) by mouth every 6 (six) hours as needed for nausea or vomiting. 30 tablet 0   senna-docusate (STIMULANT LAXATIVE) 8.6-50 MG tablet Take 2 tablets by mouth 2 (two) times daily. May add additional tablet as needed 150 tablet 3   thiamine 100 MG tablet Take 1 tablet (100 mg total) by mouth daily.     traMADol (ULTRAM) 50 MG tablet Take 1-2 tablets (50-100 mg total) by mouth every 6 (six) hours as needed. 90  tablet 0   No current facility-administered medications for this visit.    VITAL SIGNS: There were no vitals taken for this visit. There were no vitals filed for this visit.  Estimated body mass index is 22.08 kg/m as calculated from the following:   Height as of 11/16/21: 5\' 8"  (1.727 m).   Weight as of 03/16/22: 145 lb 3.2 oz (65.9 kg).   PERFORMANCE STATUS (ECOG) : 1 - Symptomatic but completely ambulatory   IMPRESSION:  Neoplasm related pain  Mr. Serena reports pain is well controlled on current regimen. Denies any unwanted side effects. Tolerating well.   He is taking tramadol 50mg  as needed. Does not require around the clock. 1-2 times daily. Reports pain decreases to 2-3 out of 10 when he does take it. Much appreciative of his level of comfort.   No changes at this time. We will continue to closely monitor.   Constipation   Controlled with daily Miralax and Senna.   PLAN: Tramadol 50-100mg  every 6 hours as needed Miralax/Senna daily  I will plan to see patient back in 3-4 weeks in collaboration with his other oncology appointments. Will give him a call next week for close symptom follow-up.    Patient expressed understanding and was in agreement with this plan. He also understands that He can call the clinic at any time with any questions, concerns, or complaints.    Any controlled substances utilized were prescribed in the context of palliative care. PDMP has been reviewed.    Time Total: 20 min.   Visit consisted of counseling and education dealing with the complex and emotionally intense issues of symptom management and palliative care in the setting of serious and potentially life-threatening illness.Greater than 50%  of this time was spent counseling and coordinating care related to the above assessment and plan.  Alda Lea, AGPCNP-BC  Palliative Medicine Team/[facility]

## 2022-04-07 ENCOUNTER — Other Ambulatory Visit: Payer: Medicare HMO

## 2022-04-07 ENCOUNTER — Inpatient Hospital Stay: Payer: Medicare HMO

## 2022-04-07 ENCOUNTER — Inpatient Hospital Stay: Payer: Medicare HMO | Admitting: Physician Assistant

## 2022-04-07 ENCOUNTER — Ambulatory Visit: Payer: Medicare HMO

## 2022-04-07 ENCOUNTER — Ambulatory Visit: Payer: Medicare HMO | Admitting: Hematology and Oncology

## 2022-04-07 ENCOUNTER — Inpatient Hospital Stay: Payer: Medicare HMO | Admitting: Nurse Practitioner

## 2022-04-07 ENCOUNTER — Telehealth: Payer: Self-pay | Admitting: Hematology and Oncology

## 2022-04-07 NOTE — Progress Notes (Unsigned)
Palliative Medicine [facility]  Telephone:([phone] Fax:(336) [phone]   Name: Terry KittenFrederick R Mcclure Jr. Date: 04/07/2022 MRN: [id number]  DOB: 11/18/1955  Patient Care Team: Terry BeamsHagler, Rachel, MD as PCP - General (Family Medicine)   INTERVAL HISTORY: Terry KittenFrederick R Janicki Jr. is a 67 y.o. 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 He is currently undergoing chemotherapy. Palliative ask to see for symptom management.  SOCIAL HISTORY:    [name] reports that he quit smoking about 2 years ago. His smoking use included cigarettes. He has never used smokeless tobacco. He reports that he does not currently use alcohol after a past usage of about 12.0 standard drinks of alcohol per week. He reports that he does not use drugs.  ADVANCE DIRECTIVES:  None on file   CODE STATUS: Full Code  PAST MEDICAL HISTORY: Past Medical History:  Diagnosis Date   Alcohol abuse    Asthma    Bullous emphysema ([name])    Essential hypertension 04/10/2020   Hemorrhoids    History of radiation therapy 04/08/20-04/19/20   IMRT- Left lung- Dr. Antony BlackbirdJames Kinard   Incidental lung nodule, greater than or equal to 8mm 10/22/2017   Left upper lobe - discovered on CTA   Prostate cancer ([name])    Spontaneous pneumothorax 10/20/2017   right   Tobacco abuse     ALLERGIES:  is allergic to morphine and oxycodone.  MEDICATIONS:  Current Outpatient Medications  Medication Sig Dispense Refill   acetaminophen (TYLENOL) 325 MG tablet Take 2 tablets (650 mg total) by mouth every 6 (six) hours as needed for mild pain or headache (fever >/= 101).     albuterol (VENTOLIN HFA) 108 (90 Base) MCG/ACT inhaler Inhale 2 puffs into the lungs every 6 (six) hours as needed for wheezing or shortness of breath. 8 g 1   amLODipine (NORVASC) 10 MG tablet Take 1 tablet by mouth  daily.     atorvastatin (LIPITOR) 20 MG tablet Take 20 mg by mouth daily.     cyanocobalamin (VITAMIN B12) 1000 MCG tablet Take 1 tablet (1,000 mcg total) by mouth daily. 90 tablet 1   ferrous sulfate 325 (65 FE) MG EC tablet Take 1 tablet (325 mg total) by mouth daily with breakfast. 30 tablet 3   folic acid (FOLVITE) 1 MG tablet Take 1 tablet (1 mg total) by mouth daily. 30 tablet 3   hydrOXYzine (ATARAX) 25 MG tablet Take 1 tablet (25 mg total) by mouth at bedtime as needed for itching. 30 tablet 11   ipratropium-albuterol (DUONEB) 0.5-2.5 (3) MG/3ML SOLN Take 3 mLs by nebulization every 6 (six) hours as needed. (Patient taking differently: Take 3 mLs by nebulization every 6 (six) hours as needed (shortness of breathing or wheezing).) 360 mL 0   lidocaine (LIDODERM) 5 % Place 1 patch onto the skin daily. Remove & Discard patch within 12 hours or as directed by MD. Apply to low back 30 patch 0   lidocaine-prilocaine (EMLA) cream Apply 1 Application topically as needed. 30 g 0   losartan (COZAAR) 100 MG tablet Take 100 mg by mouth daily.     olmesartan (BENICAR) 40 MG tablet Take 40 mg by mouth daily.     ondansetron (ZOFRAN) 8 MG tablet Take 1 tablet (8 mg total) by mouth every 8 (eight) hours as needed. 30 tablet 0   polyethylene glycol (  MIRALAX) 17 g packet Take 17 g by mouth daily. 30 each 2   potassium chloride SA (KLOR-CON M) 20 MEQ tablet TAKE 1 TABLET(20 MEQ) BY MOUTH DAILY 90 tablet 0   prochlorperazine (COMPAZINE) 10 MG tablet Take 1 tablet (10 mg total) by mouth every 6 (six) hours as needed for nausea or vomiting. 30 tablet 0   senna-docusate (STIMULANT LAXATIVE) 8.6-50 MG tablet Take 2 tablets by mouth 2 (two) times daily. May add additional tablet as needed 150 tablet 3   thiamine 100 MG tablet Take 1 tablet (100 mg total) by mouth daily.     traMADol (ULTRAM) 50 MG tablet Take 1-2 tablets (50-100 mg total) by mouth every 6 (six) hours as needed. 90 tablet 0   No current  facility-administered medications for this visit.   Facility-Administered Medications Ordered in Other Visits  Medication Dose Route Frequency Provider Last Rate Last Admin   sodium chloride flush (NS) 0.9 % injection 10 mL  10 mL Intracatheter PRN Ulysees Barns IV, MD   10 mL at 04/10/22 1421    VITAL SIGNS: There were no vitals taken for this visit. There were no vitals filed for this visit.  Estimated body mass index is 21.48 kg/m as calculated from the following:   Height as of 11/16/21: 5\' 8"  (1.727 m).   Weight as of an earlier encounter on 04/10/22: 64.1 kg.   PERFORMANCE STATUS (ECOG) : 1 - Symptomatic but completely ambulatory   IMPRESSION: Terry Page is seen in infusion area. He is alone for visit, observed sitting up in bed eating a sandwich. Terry Page is alert, oriented x 4, and engaging. Reports that his pain is well managed on current regimen. Denies nausea, vomiting, and diarrhea. Endorses a good appetite and weight remains stable. States that oncology is ordering vitamin B for him.   Neoplasm related pain  Terry Page reports back and shoulder pain are well controlled on current regimen of Tramadol 50-100 mg as needed. Terry Page is taking Tramadol 50 mg approximately 3 times a day. With Tramadol, severe sharp and throbbing pains decrease to a mild, tolerable level. Terry Page appreciation for the current regimen.  No changes at this time. We will continue to closely monitor.   Constipation  Terry Page reports that constipation is well managed with daily use of Senna-S. He is not currently taking Miralax on a daily basis but is aware that it is available should he need it.  We discussed the importance of continued conversation with family and their medical providers regarding overall plan of care and treatment options, ensuring decisions are within the context of the patients values and GOCs.   PLAN: Tramadol 50-100 mg every 6 hours as needed for pain. Senna-S daily  for constipation. Miralax available if needed. Palliative will plan to see patient back in 3-4 weeks in collaboration with his other oncology appointments. Will give him a call next week for close symptom follow-up.    Patient expressed understanding and was in agreement with this plan. He also understands that He can call the clinic at any time with any questions, concerns, or complaints.    Signed by: Katy Apo, RN MSN [facility] / NP Student  Any controlled substances utilized were prescribed in the context of palliative care. PDMP has been reviewed.    Visit consisted of counseling and education dealing with the complex and emotionally intense issues of symptom management and palliative care in the setting of serious and potentially life-threatening  illness.Greater than 50%  of this time was spent counseling and coordinating care related to the above assessment and plan.  [name], AGPCNP-BC  Palliative Medicine Team/[facility]  *Please note that this is a verbal dictation therefore any spelling or grammatical errors are due to the "Dragon Medical One" system interpretation.

## 2022-04-10 ENCOUNTER — Inpatient Hospital Stay: Payer: Medicare HMO | Attending: Hematology and Oncology

## 2022-04-10 ENCOUNTER — Other Ambulatory Visit: Payer: Self-pay

## 2022-04-10 ENCOUNTER — Encounter: Payer: Self-pay | Admitting: Nurse Practitioner

## 2022-04-10 ENCOUNTER — Inpatient Hospital Stay: Payer: Medicare HMO

## 2022-04-10 ENCOUNTER — Inpatient Hospital Stay (HOSPITAL_BASED_OUTPATIENT_CLINIC_OR_DEPARTMENT_OTHER): Payer: Medicare HMO | Admitting: Nurse Practitioner

## 2022-04-10 ENCOUNTER — Inpatient Hospital Stay (HOSPITAL_BASED_OUTPATIENT_CLINIC_OR_DEPARTMENT_OTHER): Payer: Medicare HMO | Admitting: Physician Assistant

## 2022-04-10 ENCOUNTER — Telehealth: Payer: Self-pay | Admitting: Hematology and Oncology

## 2022-04-10 VITALS — BP 120/80 | HR 78 | Resp 18

## 2022-04-10 VITALS — BP 119/82 | HR 93 | Temp 97.2°F | Resp 20 | Wt 141.3 lb

## 2022-04-10 DIAGNOSIS — G893 Neoplasm related pain (acute) (chronic): Secondary | ICD-10-CM | POA: Diagnosis not present

## 2022-04-10 DIAGNOSIS — Z923 Personal history of irradiation: Secondary | ICD-10-CM | POA: Diagnosis not present

## 2022-04-10 DIAGNOSIS — D509 Iron deficiency anemia, unspecified: Secondary | ICD-10-CM

## 2022-04-10 DIAGNOSIS — Z885 Allergy status to narcotic agent status: Secondary | ICD-10-CM | POA: Insufficient documentation

## 2022-04-10 DIAGNOSIS — Z809 Family history of malignant neoplasm, unspecified: Secondary | ICD-10-CM | POA: Diagnosis not present

## 2022-04-10 DIAGNOSIS — C3412 Malignant neoplasm of upper lobe, left bronchus or lung: Secondary | ICD-10-CM | POA: Diagnosis present

## 2022-04-10 DIAGNOSIS — Z79899 Other long term (current) drug therapy: Secondary | ICD-10-CM | POA: Insufficient documentation

## 2022-04-10 DIAGNOSIS — K5900 Constipation, unspecified: Secondary | ICD-10-CM | POA: Insufficient documentation

## 2022-04-10 DIAGNOSIS — M25512 Pain in left shoulder: Secondary | ICD-10-CM | POA: Diagnosis not present

## 2022-04-10 DIAGNOSIS — Z79620 Long term (current) use of immunosuppressive biologic: Secondary | ICD-10-CM | POA: Insufficient documentation

## 2022-04-10 DIAGNOSIS — Z79891 Long term (current) use of opiate analgesic: Secondary | ICD-10-CM | POA: Diagnosis not present

## 2022-04-10 DIAGNOSIS — I10 Essential (primary) hypertension: Secondary | ICD-10-CM | POA: Diagnosis not present

## 2022-04-10 DIAGNOSIS — T451X5D Adverse effect of antineoplastic and immunosuppressive drugs, subsequent encounter: Secondary | ICD-10-CM | POA: Insufficient documentation

## 2022-04-10 DIAGNOSIS — K5903 Drug induced constipation: Secondary | ICD-10-CM

## 2022-04-10 DIAGNOSIS — Z95828 Presence of other vascular implants and grafts: Secondary | ICD-10-CM

## 2022-04-10 DIAGNOSIS — C7951 Secondary malignant neoplasm of bone: Secondary | ICD-10-CM | POA: Diagnosis not present

## 2022-04-10 DIAGNOSIS — Z5112 Encounter for antineoplastic immunotherapy: Secondary | ICD-10-CM | POA: Diagnosis present

## 2022-04-10 DIAGNOSIS — Z8546 Personal history of malignant neoplasm of prostate: Secondary | ICD-10-CM | POA: Diagnosis not present

## 2022-04-10 DIAGNOSIS — Z87891 Personal history of nicotine dependence: Secondary | ICD-10-CM | POA: Diagnosis not present

## 2022-04-10 DIAGNOSIS — Z515 Encounter for palliative care: Secondary | ICD-10-CM | POA: Insufficient documentation

## 2022-04-10 DIAGNOSIS — D6481 Anemia due to antineoplastic chemotherapy: Secondary | ICD-10-CM | POA: Diagnosis not present

## 2022-04-10 LAB — IRON AND IRON BINDING CAPACITY (CC-WL,HP ONLY)
Iron: 118 ug/dL (ref 45–182)
Saturation Ratios: 36 % (ref 17.9–39.5)
TIBC: 325 ug/dL (ref 250–450)
UIBC: 207 ug/dL (ref 117–376)

## 2022-04-10 LAB — CMP (CANCER CENTER ONLY)
ALT: 11 U/L (ref 0–44)
AST: 17 U/L (ref 15–41)
Albumin: 3.7 g/dL (ref 3.5–5.0)
Alkaline Phosphatase: 62 U/L (ref 38–126)
Anion gap: 7 (ref 5–15)
BUN: 23 mg/dL (ref 8–23)
CO2: 19 mmol/L — ABNORMAL LOW (ref 22–32)
Calcium: 9.7 mg/dL (ref 8.9–10.3)
Chloride: 102 mmol/L (ref 98–111)
Creatinine: 1.32 mg/dL — ABNORMAL HIGH (ref 0.61–1.24)
GFR, Estimated: 59 mL/min — ABNORMAL LOW (ref >60)
Glucose, Bld: 100 mg/dL — ABNORMAL HIGH (ref 70–99)
Potassium: 4.6 mmol/L (ref 3.5–5.1)
Sodium: 128 mmol/L — ABNORMAL LOW (ref 135–145)
Total Bilirubin: 0.3 mg/dL (ref 0.3–1.2)
Total Protein: 8.8 g/dL — ABNORMAL HIGH (ref 6.5–8.1)

## 2022-04-10 LAB — CBC WITH DIFFERENTIAL (CANCER CENTER ONLY)
Abs Immature Granulocytes: 0.06 10*3/uL (ref 0.00–0.07)
Basophils Absolute: 0.1 10*3/uL (ref 0.0–0.1)
Basophils Relative: 1 %
Eosinophils Absolute: 0.3 10*3/uL (ref 0.0–0.5)
Eosinophils Relative: 2 %
HCT: 29.1 % — ABNORMAL LOW (ref 39.0–52.0)
Hemoglobin: 9.5 g/dL — ABNORMAL LOW (ref 13.0–17.0)
Immature Granulocytes: 1 %
Lymphocytes Relative: 10 %
Lymphs Abs: 1.2 10*3/uL (ref 0.7–4.0)
MCH: 31.5 pg (ref 26.0–34.0)
MCHC: 32.6 g/dL (ref 30.0–36.0)
MCV: 96.4 fL (ref 80.0–100.0)
Monocytes Absolute: 1.2 10*3/uL — ABNORMAL HIGH (ref 0.1–1.0)
Monocytes Relative: 10 %
Neutro Abs: 9.4 10*3/uL — ABNORMAL HIGH (ref 1.7–7.7)
Neutrophils Relative %: 76 %
Platelet Count: 316 10*3/uL (ref 150–400)
RBC: 3.02 MIL/uL — ABNORMAL LOW (ref 4.22–5.81)
RDW: 17 % — ABNORMAL HIGH (ref 11.5–15.5)
WBC Count: 12.2 10*3/uL — ABNORMAL HIGH (ref 4.0–10.5)
nRBC: 0 % (ref 0.0–0.2)

## 2022-04-10 LAB — TSH: TSH: 0.617 u[IU]/mL (ref 0.350–4.500)

## 2022-04-10 LAB — FERRITIN: Ferritin: 665 ng/mL — ABNORMAL HIGH (ref 24–336)

## 2022-04-10 MED ORDER — PROCHLORPERAZINE MALEATE 10 MG PO TABS
10.0000 mg | ORAL_TABLET | Freq: Once | ORAL | Status: AC
  Administered 2022-04-10: 10 mg via ORAL
  Filled 2022-04-10: qty 1

## 2022-04-10 MED ORDER — CYANOCOBALAMIN 1000 MCG/ML IJ SOLN
1000.0000 ug | Freq: Once | INTRAMUSCULAR | Status: AC
  Administered 2022-04-10: 1000 ug via INTRAMUSCULAR
  Filled 2022-04-10: qty 1

## 2022-04-10 MED ORDER — SODIUM CHLORIDE 0.9% FLUSH
10.0000 mL | Freq: Once | INTRAVENOUS | Status: AC
  Administered 2022-04-10: 10 mL

## 2022-04-10 MED ORDER — SODIUM CHLORIDE 0.9% FLUSH
10.0000 mL | INTRAVENOUS | Status: DC | PRN
  Administered 2022-04-10: 10 mL

## 2022-04-10 MED ORDER — SODIUM CHLORIDE 0.9 % IV SOLN
200.0000 mg | Freq: Once | INTRAVENOUS | Status: AC
  Administered 2022-04-10: 200 mg via INTRAVENOUS
  Filled 2022-04-10: qty 200

## 2022-04-10 MED ORDER — VITAMIN B-12 1000 MCG PO TABS: 1000.0000 ug | ORAL_TABLET | Freq: Every day | ORAL | 1 refills | Status: DC

## 2022-04-10 MED ORDER — SODIUM CHLORIDE 0.9 % IV SOLN: Freq: Once | INTRAVENOUS | Status: AC

## 2022-04-10 MED ORDER — HEPARIN SOD (PORK) LOCK FLUSH 100 UNIT/ML IV SOLN
500.0000 [IU] | Freq: Once | INTRAVENOUS | Status: AC | PRN
  Administered 2022-04-10: 500 [IU]

## 2022-04-10 NOTE — Patient Instructions (Signed)
[facility]  Discharge Instructions: Thank you for choosing [facility] to provide your oncology and hematology care.   If you have a lab appointment with the Cancer Center, please go directly to the Cancer Center and check in at the registration area.   Wear comfortable clothing and clothing appropriate for easy access to any Portacath or PICC line.   We strive to give you quality time with your provider. You may need to reschedule your appointment if you arrive late (15 or more minutes).  Arriving late affects you and other patients whose appointments are after yours.  Also, if you miss three or more appointments without notifying the office, you may be dismissed from the clinic at the provider's discretion.      For prescription refill requests, have your pharmacy contact our office and allow 72 hours for refills to be completed.    Today you received the following chemotherapy and/or immunotherapy agents: Keytruda      To help prevent nausea and vomiting after your treatment, we encourage you to take your nausea medication as directed.  BELOW ARE SYMPTOMS THAT SHOULD BE REPORTED IMMEDIATELY: *FEVER GREATER THAN 100.4 F (38 C) OR HIGHER *CHILLS OR SWEATING *NAUSEA AND VOMITING THAT IS NOT CONTROLLED WITH YOUR NAUSEA MEDICATION *UNUSUAL SHORTNESS OF BREATH *UNUSUAL BRUISING OR BLEEDING *URINARY PROBLEMS (pain or burning when urinating, or frequent urination) *BOWEL PROBLEMS (unusual diarrhea, constipation, pain near the anus) TENDERNESS IN MOUTH AND THROAT WITH OR WITHOUT PRESENCE OF ULCERS (sore throat, sores in mouth, or a toothache) UNUSUAL RASH, SWELLING OR PAIN  UNUSUAL VAGINAL DISCHARGE OR ITCHING   Items with * indicate a potential emergency and should be followed up as soon as possible or go to the Emergency Department if any problems should occur.  Please show the CHEMOTHERAPY ALERT CARD or IMMUNOTHERAPY ALERT CARD at  check-in to the Emergency Department and triage nurse.  Should you have questions after your visit or need to cancel or reschedule your appointment, please contact [facility]  Dept: [phone]  and follow the prompts.  Office hours are 8:00 a.m. to 4:30 p.m. Monday - Friday. Please note that voicemails left after 4:00 p.m. may not be returned until the following business day.  We are closed weekends and major holidays. You have access to a nurse at all times for urgent questions. Please call the main number to the clinic Dept: [phone] and follow the prompts.   For any non-urgent questions, you may also contact your provider using MyChart. We now offer e-Visits for anyone 18 and older to request care online for non-urgent symptoms. For details visit [email or web address].   Also download the MyChart app! Go to the app store, search "MyChart", open the app, select [facility], and log in with your MyChart username and password.   

## 2022-04-10 NOTE — Progress Notes (Signed)
[facility] Telephone:(336) [phone]   Fax:(336) [phone]  PROGRESS NOTE  Patient Care Team: Aliene BeamsHagler, Rachel, MD as PCP - General (Family Medicine)  Hematological/Oncological History # Metastatic Adenocarcinoma of the Lung  01/07/2020 : CT of the chest with contrast performed which showed interval enlargement of a spiculated nodule in the central left upper lobe measuring 1.2 x 1.0 cm and highly suspicious for primary lung malignancy 02/06/2020 :PET scan performed which showed a hypermetabolic irregular solid 1.3 cm left upper lobe pulmonary nodule compatible with malignancy without any other hypermetabolic lesions 1/4/78294/07/2020- 04/19/2020:  SBRT to the lung lesion 04/28/2021: CT C/A/P showed multifocal lytic bone metastases are identified. Lesion within the spine of the left scapula and lesions involving bilateral iliac bones and left sacral wing. Establish care with Dr. Leonides Schanzorsey  05/03/2021: biopsy of right iliac lytic lesion showed metastatic carcinoma, consistent with lung primary.  07/08/2021: Cycle 1 Day 1 of Carbo/Pem/Pem 07/29/2021: Cycle 2 Day 1 of Carbo/Pem/Pem 08/11/2021-08/24/2021: Received palliative radiation to osseous metastasis in the left shoulder. 30 Gy in 10 fractions.  08/19/2021: Cycle 3 Day 1 of Carbo/Pem/Pem 09/09/2021: Cycle 4 Day 1 of Carbo/Pem/Pem 09/23/2021: CT CAP: stable disease 09/30/2021: Cycle 5 Day 1 of Pem/Pem 10/21/2021: Cycle 6 Day 1 of Pembrolizumab. Held pemetrexed due to anemia. 11/16/2021: Cycle 7 Day 1 of Pem/Pem.   12/16/2021: Cycle 8 Day 1 of Pem/Pem.   01/06/2022: Cycle 9 Day 1 of Pem/Pem.  01/27/2022: Cycle 10 Day 1 of Pem/Pem 02/17/2022: Cycle 11 Day 1 of Pem/Pem 03/10/2022: Cycle 12 Day 1 of Pem/Pem (HELD due to anemia with Hgb of 6.4) 03/17/2022: Cycle 12 Day 1 of Pem/Pem (HELD pemextrexed due to cytopenias/kidney function) 04/10/2022: Cycle 13 Day 1 of Pem/Pem (HELD pemextrexed due to cytopenias/kidney function)  #Adenocarcinoma of the Prostate,  [id number] 08/19/2019-10/07/2019: 70 Gy in 28 fractions of 2.5 Gy.  Radiation to the prostate was under the care of Dr. Margaretmary DysMatthew Manning  Interval History:  Terry KittenFrederick R Mayor Jr. 67 y.o. male with medical history significant for metastatic adenocarcinoma of the lung who presents for a follow up visit. The patient's last visit was on 03/16/2022. In the interim since the last visit he completed Cycle 12 with single agent pembrolizumab. He is unaccompanied for this visit.   On exam today, [name] reports his energy levels are overall stable. He is able to complete his ADLs at baseline. He denies any appetite or weight changes. He denies nausea, vomiting or abdominal pain. His bowel habits are unchanged without recurrent episodes of diarrhea or constipation. His stools are dark due to iron pills. He denies his back and shoulder pain are well controlled with tramadol that he takes three times a day. He denies fevers, chills, sweats, shortness of breath, chest pain or cough. He has no other complaints. A full 10 point ROS is listed below.  Overall he is willing and able to proceed with chemotherapy at this time.  MEDICAL HISTORY:  Past Medical History:  Diagnosis Date   Alcohol abuse    Asthma    Bullous emphysema ([name])    Essential hypertension 04/10/2020   Hemorrhoids    History of radiation therapy 04/08/20-04/19/20   IMRT- Left lung- Dr. Antony BlackbirdJames Kinard   Incidental lung nodule, greater than or equal to 8mm 10/22/2017   Left upper lobe - discovered on CTA   Prostate cancer ([name])    Spontaneous pneumothorax 10/20/2017   right   Tobacco abuse     SURGICAL HISTORY: Past Surgical History:  Procedure Laterality Date   BACK SURGERY     IR IMAGING GUIDED PORT INSERTION  06/29/2021   PROSTATE BIOPSY      SOCIAL HISTORY: Social History   Socioeconomic History   Marital status: Single    Spouse name: Not on file   Number of children: Not on file   Years of education: Not on file   Highest  education level: Not on file  Occupational History   Occupation: retired  Tobacco Use   Smoking status: Former    Types: Cigarettes    Quit date: 11/22/2019    Years since quitting: 2.3   Smokeless tobacco: Never   Tobacco comments:    Patient reports quit 3 years ago. 06/25/20. HSM  Vaping Use   Vaping Use: Never used  Substance and Sexual Activity   Alcohol use: Not Currently    Alcohol/week: 12.0 standard drinks of alcohol    Types: 12 Cans of beer per week    Comment: daily   Drug use: No   Sexual activity: Not Currently  Other Topics Concern   Not on file  Social History Narrative   Not on file   Social Determinants of Health   Financial Resource Strain: Not on file  Food Insecurity: Not on file  Transportation Needs: Unmet Transportation Needs (07/18/2021)   PRAPARE - Administrator, Civil Service (Medical): Yes    Lack of Transportation (Non-Medical): Yes  Physical Activity: Not on file  Stress: Not on file  Social Connections: Not on file  Intimate Partner Violence: Not on file    FAMILY HISTORY: Family History  Problem Relation Age of Onset   Cancer Cousin        maternal cousin   Cancer Cousin        paternal cousin   Cancer Cousin    Colon polyps Neg Hx    Pancreatic disease Neg Hx    Pancreatic cancer Neg Hx    Breast cancer Neg Hx    Colon cancer Neg Hx     ALLERGIES:  is allergic to morphine and oxycodone.  MEDICATIONS:  Current Outpatient Medications  Medication Sig Dispense Refill   acetaminophen (TYLENOL) 325 MG tablet Take 2 tablets (650 mg total) by mouth every 6 (six) hours as needed for mild pain or headache (fever >/= 101).     albuterol (VENTOLIN HFA) 108 (90 Base) MCG/ACT inhaler Inhale 2 puffs into the lungs every 6 (six) hours as needed for wheezing or shortness of breath. 8 g 1   amLODipine (NORVASC) 10 MG tablet Take 1 tablet by mouth daily.     atorvastatin (LIPITOR) 20 MG tablet Take 20 mg by mouth daily.      cyanocobalamin (VITAMIN B12) 1000 MCG tablet Take 1 tablet (1,000 mcg total) by mouth daily. 90 tablet 1   ferrous sulfate 325 (65 FE) MG EC tablet Take 1 tablet (325 mg total) by mouth daily with breakfast. 30 tablet 3   folic acid (FOLVITE) 1 MG tablet Take 1 tablet (1 mg total) by mouth daily. 30 tablet 3   hydrOXYzine (ATARAX) 25 MG tablet Take 1 tablet (25 mg total) by mouth at bedtime as needed for itching. 30 tablet 11   ipratropium-albuterol (DUONEB) 0.5-2.5 (3) MG/3ML SOLN Take 3 mLs by nebulization every 6 (six) hours as needed. (Patient taking differently: Take 3 mLs by nebulization every 6 (six) hours as needed (shortness of breathing or wheezing).) 360 mL 0   lidocaine (LIDODERM) 5 %  Place 1 patch onto the skin daily. Remove & Discard patch within 12 hours or as directed by MD. Apply to low back 30 patch 0   lidocaine-prilocaine (EMLA) cream Apply 1 Application topically as needed. 30 g 0   losartan (COZAAR) 100 MG tablet Take 100 mg by mouth daily.     olmesartan (BENICAR) 40 MG tablet Take 40 mg by mouth daily.     ondansetron (ZOFRAN) 8 MG tablet Take 1 tablet (8 mg total) by mouth every 8 (eight) hours as needed. 30 tablet 0   polyethylene glycol (MIRALAX) 17 g packet Take 17 g by mouth daily. 30 each 2   potassium chloride SA (KLOR-CON M) 20 MEQ tablet TAKE 1 TABLET(20 MEQ) BY MOUTH DAILY 90 tablet 0   prochlorperazine (COMPAZINE) 10 MG tablet Take 1 tablet (10 mg total) by mouth every 6 (six) hours as needed for nausea or vomiting. 30 tablet 0   senna-docusate (STIMULANT LAXATIVE) 8.6-50 MG tablet Take 2 tablets by mouth 2 (two) times daily. May add additional tablet as needed 150 tablet 3   thiamine 100 MG tablet Take 1 tablet (100 mg total) by mouth daily.     traMADol (ULTRAM) 50 MG tablet Take 1-2 tablets (50-100 mg total) by mouth every 6 (six) hours as needed. 90 tablet 0   No current facility-administered medications for this visit.    REVIEW OF SYSTEMS:    Constitutional: ( - ) fevers, ( - )  chills , ( - ) night sweats Eyes: ( - ) blurriness of vision, ( - ) double vision, ( - ) watery eyes Ears, nose, mouth, throat, and face: ( - ) mucositis, ( - ) sore throat Respiratory: ( - ) cough, ( - ) dyspnea, ( - ) wheezes Cardiovascular: ( - ) palpitation, ( - ) chest discomfort, ( - ) lower extremity swelling Gastrointestinal:  ( - ) nausea, ( - ) heartburn, ( - ) change in bowel habits Skin: ( - ) abnormal skin rashes Lymphatics: ( - ) new lymphadenopathy, ( - ) easy bruising Neurological: ( - ) numbness, ( - ) tingling, ( - ) new weaknesses Behavioral/Psych: ( - ) mood change, ( - ) new changes  All other systems were reviewed with the patient and are negative.  PHYSICAL EXAMINATION: ECOG PERFORMANCE STATUS: 1 - Symptomatic but completely ambulatory  Vitals:   04/10/22 1246  BP: 119/82  Pulse: 93  Resp: 20  Temp: (!) 97.2 F (36.2 C)  SpO2: 100%    Filed Weights   04/10/22 1246  Weight: 141 lb 4.8 oz (64.1 kg)     GENERAL: Well-appearing elderly African-American male, alert, no distress and comfortable SKIN: skin color, texture, turgor are normal, no rashes or significant lesions EYES: conjunctiva are pink and non-injected, sclera clear LUNGS: clear to auscultation and percussion with normal breathing effort HEART: regular rate & rhythm and no murmurs and no lower extremity edema Musculoskeletal: no cyanosis of digits and no clubbing  PSYCH: alert & oriented x 3, fluent speech NEURO: no focal motor/sensory deficits  LABORATORY DATA:  I have reviewed the data as listed    Latest Ref Rng & Units 04/10/2022   12:12 PM 03/16/2022    9:01 AM 03/10/2022   11:00 AM  CBC  WBC 4.0 - 10.5 K/uL 12.2  12.1  11.4   Hemoglobin 13.0 - 17.0 g/dL 9.5  9.4  6.7   Hematocrit 39.0 - 52.0 % 29.1  28.8  20.1  Platelets 150 - 400 K/uL 316  414  433        Latest Ref Rng & Units 03/16/2022    9:01 AM 03/10/2022    9:50 AM 02/17/2022    8:14 AM   CMP  Glucose 70 - 99 mg/dL 89  932  671   BUN 8 - 23 mg/dL 20  24  19    Creatinine 0.61 - 1.24 mg/dL 2.45  8.09  9.83   Sodium 135 - 145 mmol/L 128  126  134   Potassium 3.5 - 5.1 mmol/L 4.6  4.5  4.5   Chloride 98 - 111 mmol/L 100  99  106   CO2 22 - 32 mmol/L 21  18  20    Calcium 8.9 - 10.3 mg/dL 9.3  8.4  9.1   Total Protein 6.5 - 8.1 g/dL 8.7  8.4  8.0   Total Bilirubin 0.3 - 1.2 mg/dL 0.3  0.2  0.3   Alkaline Phos 38 - 126 U/L 73  66  82   AST 15 - 41 U/L 33  39  22   ALT 0 - 44 U/L 29  34  16     Lab Results  Component Value Date   MPROTEIN Not Observed 04/28/2021   Lab Results  Component Value Date   KPAFRELGTCHN 33.6 (H) 04/28/2021   LAMBDASER 22.0 04/28/2021   KAPLAMBRATIO 1.53 04/28/2021    RADIOGRAPHIC STUDIES: No results found.  ASSESSMENT & PLAN Author Slaven. 67 y.o. male with medical history significant for newly diagnosed metastatic adenocarcinoma of the lung who presents for a follow up visit.  # Metastatic Adenocarcinoma of the Lung  -- NGS testing from this patient shows no evidence of targetable mutation. --MRI brain shows no evidence of intracranial metastases --Started carboplatin, pembrolizumab, and pemetrexed as treatment for his cancer on 07/08/2021. --Received palliative radiation to osseous metastasis of the left shoulder from 08/11/2021-08/24/2021. Planned dose is 30 Gy in 10 fx.  Plan: --today is Day 1 Cycle 13 today --labs from today were reviewed. Labs show white blood cell count 12.2,  hemoglobin 9.5, MCV 95.7, and platelets of 316. Creatinine levels are continuing to improve to 1.32 today.  --Proceed with treatment today but only with single agent pembrolizumab. Will continue to hold pemetrexed due to anemia and kidney dysfunction.  --CT C/A/P from 03/07/2022 showed stable disease. Next CT CAP due in June 2024.  --RTC in 3 weeks with labs, follow up visit to assess for Cycle 14 of chemotherapy  #Acute anemia: --Hgb level is stable at  9.5 today, up from 6.7 on 03/10/2022.  --Currently on ferrous sulfate 325 mg PO daily --Repeat iron panel pending today.  --Gave stool cards to evaluate for GI bleeding, patient will drop off next week  #Left shoulder pain: --Secondary to left scapular metastasis.  --Most recent CT from 09/23/2021 showed stable osseous disease  #Pain Control --stopped oxycodone due to itching --currently following with Palliative care.  --pain well controlled with tramadol as needed. Continue to take tramdol 50-100 mg q 6 hours as needed --Continue senna docusate for constipation prophylaxis --Continue to monitor  #Supportive Care -- chemotherapy education complete -- port placed -- zofran 8mg  q8H PRN and compazine 10mg  PO q6H for nausea -- EMLA cream for port -- Patient referred to palliative care for pain control. Pain control as above.    No orders of the defined types were placed in this encounter.   All questions were answered. The patient knows  to call the clinic with any problems, questions or concerns.  I have spent a total of 30 minutes minutes of face-to-face and non-face-to-face time, preparing to see the patient, performing a medically appropriate examination, counseling and educating the patient, referring and communicating with other health care professionals, documenting clinical information in the electronic health record, and care coordination.   Georga Kaufmann PA-C Dept of Hematology and Oncology [facility] Phone: [phone]   04/10/2022 12:50 PM

## 2022-04-10 NOTE — Telephone Encounter (Signed)
Reached out to patient to schedule per WQ, left voicemail. 

## 2022-04-11 ENCOUNTER — Telehealth: Payer: Self-pay

## 2022-04-11 LAB — T4: T4, Total: 7.8 ug/dL (ref 4.5–12.0)

## 2022-04-11 NOTE — Telephone Encounter (Signed)
Pt advised and voiced understanding.   

## 2022-04-11 NOTE — Telephone Encounter (Signed)
-----   Message from Briant Cedar, PA-C sent at 04/11/2022  8:43 AM EDT ----- Please notify patient that iron levels normal. Okay to monitor and hold future PO iron.

## 2022-04-18 ENCOUNTER — Other Ambulatory Visit: Payer: Self-pay

## 2022-04-18 ENCOUNTER — Telehealth: Payer: Self-pay

## 2022-04-18 DIAGNOSIS — G893 Neoplasm related pain (acute) (chronic): Secondary | ICD-10-CM

## 2022-04-18 DIAGNOSIS — C7951 Secondary malignant neoplasm of bone: Secondary | ICD-10-CM

## 2022-04-18 DIAGNOSIS — Z515 Encounter for palliative care: Secondary | ICD-10-CM

## 2022-04-18 MED ORDER — TRAMADOL HCL 50 MG PO TABS
50.0000 mg | ORAL_TABLET | Freq: Four times a day (QID) | ORAL | 0 refills | Status: DC | PRN
Start: 2022-04-18 — End: 2022-05-15

## 2022-04-18 NOTE — Telephone Encounter (Signed)
Notified Patient of prior authorization approval for Tramadol 50mg  Tablets. Medication is approved through 01/02/2023. No other needs or concerns voiced at this time.

## 2022-04-19 ENCOUNTER — Encounter: Payer: Self-pay | Admitting: Podiatry

## 2022-04-19 ENCOUNTER — Ambulatory Visit: Payer: Medicare HMO | Admitting: Podiatry

## 2022-04-19 VITALS — BP 133/75 | HR 74

## 2022-04-19 DIAGNOSIS — B351 Tinea unguium: Secondary | ICD-10-CM

## 2022-04-19 DIAGNOSIS — M79674 Pain in right toe(s): Secondary | ICD-10-CM

## 2022-04-19 DIAGNOSIS — M79675 Pain in left toe(s): Secondary | ICD-10-CM

## 2022-04-19 NOTE — Progress Notes (Signed)
This patient returns to my office for at risk foot care.  This patient requires this care by a professional since this patient will be at risk due to having kidney disease and his is taking chemo.  This patient is unable to cut nails himself since the patient cannot reach his nails.These nails are painful walking and wearing shoes.  This patient presents for at risk foot care today.  General Appearance  Alert, conversant and in no acute stress.  Vascular  Dorsalis pedis and posterior tibial  pulses are palpable  left foot.  Absent pulses right foot.  Capillary return is within normal limits  bilaterally. Temperature is within normal limits  bilaterally.  Neurologic  Senn-Weinstein monofilament wire test within normal limits  bilaterally. Muscle power within normal limits bilaterally.  Nails Thick disfigured discolored nails with subungual debris  from hallux to fifth toes bilaterally. No evidence of bacterial infection or drainage bilaterally.  Orthopedic  No limitations of motion  feet .  No crepitus or effusions noted.  HAV  B/L.  Skin  normotropic skin with no porokeratosis noted bilaterally.  No signs of infections or ulcers noted.     Onychomycosis  Pain in right toes  Pain in left toes   Mechanical debridement of nails 1-5  bilaterally performed with a nail nipper.  Filed with dremel without incident.    Return office visit   3 months                   Told patient to return for periodic foot care and evaluation due to potential at risk complications.   Helane Gunther DPM

## 2022-04-21 ENCOUNTER — Other Ambulatory Visit: Payer: Medicare HMO

## 2022-04-21 ENCOUNTER — Ambulatory Visit: Payer: Medicare HMO | Admitting: Hematology and Oncology

## 2022-04-21 ENCOUNTER — Ambulatory Visit: Payer: Medicare HMO

## 2022-04-28 ENCOUNTER — Inpatient Hospital Stay: Payer: Medicare HMO

## 2022-04-28 ENCOUNTER — Other Ambulatory Visit: Payer: Self-pay | Admitting: *Deleted

## 2022-04-28 ENCOUNTER — Ambulatory Visit: Payer: Medicare HMO

## 2022-04-28 ENCOUNTER — Other Ambulatory Visit: Payer: Self-pay

## 2022-04-28 ENCOUNTER — Ambulatory Visit: Payer: Medicare HMO | Admitting: Physician Assistant

## 2022-04-28 ENCOUNTER — Inpatient Hospital Stay (HOSPITAL_BASED_OUTPATIENT_CLINIC_OR_DEPARTMENT_OTHER): Payer: Medicare HMO | Admitting: Hematology and Oncology

## 2022-04-28 ENCOUNTER — Other Ambulatory Visit: Payer: Medicare HMO

## 2022-04-28 VITALS — BP 107/77 | HR 92 | Temp 97.3°F | Resp 16 | Wt 144.5 lb

## 2022-04-28 DIAGNOSIS — Z5112 Encounter for antineoplastic immunotherapy: Secondary | ICD-10-CM | POA: Diagnosis not present

## 2022-04-28 DIAGNOSIS — C7951 Secondary malignant neoplasm of bone: Secondary | ICD-10-CM | POA: Diagnosis not present

## 2022-04-28 DIAGNOSIS — C3412 Malignant neoplasm of upper lobe, left bronchus or lung: Secondary | ICD-10-CM

## 2022-04-28 DIAGNOSIS — Z95828 Presence of other vascular implants and grafts: Secondary | ICD-10-CM | POA: Diagnosis not present

## 2022-04-28 DIAGNOSIS — C3490 Malignant neoplasm of unspecified part of unspecified bronchus or lung: Secondary | ICD-10-CM | POA: Diagnosis not present

## 2022-04-28 LAB — CBC WITH DIFFERENTIAL (CANCER CENTER ONLY)
Abs Immature Granulocytes: 0.05 10*3/uL (ref 0.00–0.07)
Basophils Absolute: 0.1 10*3/uL (ref 0.0–0.1)
Basophils Relative: 1 %
Eosinophils Absolute: 0.3 10*3/uL (ref 0.0–0.5)
Eosinophils Relative: 3 %
HCT: 29 % — ABNORMAL LOW (ref 39.0–52.0)
Hemoglobin: 9.5 g/dL — ABNORMAL LOW (ref 13.0–17.0)
Immature Granulocytes: 1 %
Lymphocytes Relative: 12 %
Lymphs Abs: 1.3 10*3/uL (ref 0.7–4.0)
MCH: 31.6 pg (ref 26.0–34.0)
MCHC: 32.8 g/dL (ref 30.0–36.0)
MCV: 96.3 fL (ref 80.0–100.0)
Monocytes Absolute: 1.2 10*3/uL — ABNORMAL HIGH (ref 0.1–1.0)
Monocytes Relative: 11 %
Neutro Abs: 8.1 10*3/uL — ABNORMAL HIGH (ref 1.7–7.7)
Neutrophils Relative %: 72 %
Platelet Count: 298 10*3/uL (ref 150–400)
RBC: 3.01 MIL/uL — ABNORMAL LOW (ref 4.22–5.81)
RDW: 15.5 % (ref 11.5–15.5)
WBC Count: 10.9 10*3/uL — ABNORMAL HIGH (ref 4.0–10.5)
nRBC: 0 % (ref 0.0–0.2)

## 2022-04-28 LAB — CMP (CANCER CENTER ONLY)
ALT: 8 U/L (ref 0–44)
AST: 16 U/L (ref 15–41)
Albumin: 4 g/dL (ref 3.5–5.0)
Alkaline Phosphatase: 70 U/L (ref 38–126)
Anion gap: 6 (ref 5–15)
BUN: 20 mg/dL (ref 8–23)
CO2: 21 mmol/L — ABNORMAL LOW (ref 22–32)
Calcium: 9.8 mg/dL (ref 8.9–10.3)
Chloride: 100 mmol/L (ref 98–111)
Creatinine: 1.23 mg/dL (ref 0.61–1.24)
GFR, Estimated: >60 mL/min (ref >60)
Glucose, Bld: 98 mg/dL (ref 70–99)
Potassium: 4.7 mmol/L (ref 3.5–5.1)
Sodium: 127 mmol/L — ABNORMAL LOW (ref 135–145)
Total Bilirubin: 0.4 mg/dL (ref 0.3–1.2)
Total Protein: 8.8 g/dL — ABNORMAL HIGH (ref 6.5–8.1)

## 2022-04-28 LAB — TSH: TSH: 0.818 u[IU]/mL (ref 0.350–4.500)

## 2022-04-28 MED ORDER — SODIUM CHLORIDE 0.9% FLUSH
10.0000 mL | Freq: Once | INTRAVENOUS | Status: AC
  Administered 2022-04-28: 10 mL

## 2022-04-28 MED ORDER — HEPARIN SOD (PORK) LOCK FLUSH 100 UNIT/ML IV SOLN
500.0000 [IU] | Freq: Once | INTRAVENOUS | Status: AC | PRN
  Administered 2022-04-28: 500 [IU]

## 2022-04-28 MED ORDER — SODIUM CHLORIDE 0.9 % IV SOLN
200.0000 mg | Freq: Once | INTRAVENOUS | Status: AC
  Administered 2022-04-28: 200 mg via INTRAVENOUS
  Filled 2022-04-28: qty 200

## 2022-04-28 MED ORDER — PROCHLORPERAZINE MALEATE 10 MG PO TABS
10.0000 mg | ORAL_TABLET | Freq: Once | ORAL | Status: AC
  Administered 2022-04-28: 10 mg via ORAL
  Filled 2022-04-28: qty 1

## 2022-04-28 MED ORDER — SODIUM CHLORIDE 0.9 % IV SOLN: Freq: Once | INTRAVENOUS | Status: AC

## 2022-04-28 MED ORDER — SENNOSIDES-DOCUSATE SODIUM 8.6-50 MG PO TABS: 2.0000 | ORAL_TABLET | Freq: Two times a day (BID) | ORAL | 3 refills | Status: DC

## 2022-04-28 MED ORDER — SODIUM CHLORIDE 0.9% FLUSH
10.0000 mL | INTRAVENOUS | Status: DC | PRN
  Administered 2022-04-28: 10 mL

## 2022-04-28 NOTE — Patient Instructions (Signed)
[facility]  Discharge Instructions: Thank you for choosing [facility] to provide your oncology and hematology care.   If you have a lab appointment with the Cancer Center, please go directly to the Cancer Center and check in at the registration area.   Wear comfortable clothing and clothing appropriate for easy access to any Portacath or PICC line.   We strive to give you quality time with your provider. You may need to reschedule your appointment if you arrive late (15 or more minutes).  Arriving late affects you and other patients whose appointments are after yours.  Also, if you miss three or more appointments without notifying the office, you may be dismissed from the clinic at the provider's discretion.      For prescription refill requests, have your pharmacy contact our office and allow 72 hours for refills to be completed.    Today you received the following chemotherapy and/or immunotherapy agents: Keytruda      To help prevent nausea and vomiting after your treatment, we encourage you to take your nausea medication as directed.  BELOW ARE SYMPTOMS THAT SHOULD BE REPORTED IMMEDIATELY: *FEVER GREATER THAN 100.4 F (38 C) OR HIGHER *CHILLS OR SWEATING *NAUSEA AND VOMITING THAT IS NOT CONTROLLED WITH YOUR NAUSEA MEDICATION *UNUSUAL SHORTNESS OF BREATH *UNUSUAL BRUISING OR BLEEDING *URINARY PROBLEMS (pain or burning when urinating, or frequent urination) *BOWEL PROBLEMS (unusual diarrhea, constipation, pain near the anus) TENDERNESS IN MOUTH AND THROAT WITH OR WITHOUT PRESENCE OF ULCERS (sore throat, sores in mouth, or a toothache) UNUSUAL RASH, SWELLING OR PAIN  UNUSUAL VAGINAL DISCHARGE OR ITCHING   Items with * indicate a potential emergency and should be followed up as soon as possible or go to the Emergency Department if any problems should occur.  Please show the CHEMOTHERAPY ALERT CARD or IMMUNOTHERAPY ALERT CARD at  check-in to the Emergency Department and triage nurse.  Should you have questions after your visit or need to cancel or reschedule your appointment, please contact [facility]  Dept: [phone]  and follow the prompts.  Office hours are 8:00 a.m. to 4:30 p.m. Monday - Friday. Please note that voicemails left after 4:00 p.m. may not be returned until the following business day.  We are closed weekends and major holidays. You have access to a nurse at all times for urgent questions. Please call the main number to the clinic Dept: [phone] and follow the prompts.   For any non-urgent questions, you may also contact your provider using MyChart. We now offer e-Visits for anyone 18 and older to request care online for non-urgent symptoms. For details visit [email or web address].   Also download the MyChart app! Go to the app store, search "MyChart", open the app, select [facility], and log in with your MyChart username and password.   

## 2022-04-28 NOTE — Progress Notes (Signed)
[facility] [phone]   [phone]  PROGRESS NOTE  Patient Care Team: Aliene Beams, MD as PCP - General (Family Medicine)  Hematological/Oncological History # Metastatic Adenocarcinoma of the Lung  01/07/2020 : CT of the chest with contrast performed which showed interval enlargement of a spiculated nodule in the central left upper lobe measuring 1.2 x 1.0 cm and highly suspicious for primary lung malignancy 02/06/2020 :PET scan performed which showed a hypermetabolic irregular solid 1.3 cm left upper lobe pulmonary nodule compatible with malignancy without any other hypermetabolic lesions 04/06/4096- 04/19/2020:  SBRT to the lung lesion 04/28/2021: CT C/A/P showed multifocal lytic bone metastases are identified. Lesion within the spine of the left scapula and lesions involving bilateral iliac bones and left sacral wing. Establish care with Dr. Leonides Schanz  05/03/2021: biopsy of right iliac lytic lesion showed metastatic carcinoma, consistent with lung primary.  07/08/2021: Cycle 1 Day 1 of Carbo/Pem/Pem 07/29/2021: Cycle 2 Day 1 of Carbo/Pem/Pem 08/11/2021-08/24/2021: Received palliative radiation to osseous metastasis in the left shoulder. 30 Gy in 10 fractions.  08/19/2021: Cycle 3 Day 1 of Carbo/Pem/Pem 09/09/2021: Cycle 4 Day 1 of Carbo/Pem/Pem 09/23/2021: CT CAP: stable disease 09/30/2021: Cycle 5 Day 1 of Pem/Pem 10/21/2021: Cycle 6 Day 1 of Pembrolizumab. Held pemetrexed due to anemia. 11/16/2021: Cycle 7 Day 1 of Pem/Pem.   12/16/2021: Cycle 8 Day 1 of Pem/Pem.   01/06/2022: Cycle 9 Day 1 of Pem/Pem.  01/27/2022: Cycle 10 Day 1 of Pem/Pem 02/17/2022: Cycle 11 Day 1 of Pem/Pem 03/10/2022: Cycle 12 Day 1 of Pem/Pem (HELD due to anemia with Hgb of 6.4) 03/17/2022: Cycle 12 Day 1 of Pem/Pem (HELD pemextrexed due to cytopenias/kidney function) 04/10/2022: Cycle 13 Day 1 of Pem/Pem (HELD pemextrexed due to cytopenias/kidney function) 04/28/2022: Cycle 14 Day 1 of Pem/Pem  (HELD pemextrexed due to cytopenias/kidney function)  #Adenocarcinoma of the Prostate, T1cN0M0 08/19/2019-10/07/2019: 70 Gy in 28 fractions of 2.5 Gy.  Radiation to the prostate was under the care of Dr. Margaretmary Dys  Interval History:  Terry Page. 67 y.o. male with medical history significant for metastatic adenocarcinoma of the lung who presents for a follow up visit. The patient's last visit was on 04/10/2022. In the interim since the last visit he completed Cycle 13 with single agent pembrolizumab. He is unaccompanied for this visit.   On exam today, Terry Page reports he has been well overall interim since her last visit.  He reports his energy levels are good and he is eating well.  He did not have any trouble with his last treatment.  He is not having any nausea, vomiting, or diarrhea.  He reports sometimes the day after he feels a little bit fatigued.  He has been sleeping well.  He reports that he does his best to try to walk up and down the breezeway in his apartment complex.  He reports to about 30 steps down 3 steps up needed about 300 steps per day.  He notes he does not have any shortness of breath or lightheadedness as he takes breaks if he needs them.  He reports his pain is currently under good control with his tramadol therapy.  He is also drinking Gatorade, approximately six 6 ounce bottles per day.  He reports that he also is taking tramadol about 4 times per day, typically every 6 hours as needed.  He denies fevers, chills, sweats, shortness of breath, chest pain or cough. He has no other complaints. A full 10 point  ROS is listed below.  Overall he is willing and able to proceed with chemotherapy at this time.  MEDICAL HISTORY:  Past Medical History:  Diagnosis Date   Alcohol abuse    Asthma    Bullous emphysema ([name])    Essential hypertension 04/10/2020   Hemorrhoids    History of radiation therapy 04/08/20-04/19/20   IMRT- Left lung- Dr. Antony Blackbird   Incidental lung  nodule, greater than or equal to 8mm 10/22/2017   Left upper lobe - discovered on CTA   Prostate cancer ([name])    Spontaneous pneumothorax 10/20/2017   right   Tobacco abuse     SURGICAL HISTORY: Past Surgical History:  Procedure Laterality Date   BACK SURGERY     IR IMAGING GUIDED PORT INSERTION  06/29/2021   PROSTATE BIOPSY      SOCIAL HISTORY: Social History   Socioeconomic History   Marital status: Single    Spouse name: Not on file   Number of children: Not on file   Years of education: Not on file   Highest education level: Not on file  Occupational History   Occupation: retired  Tobacco Use   Smoking status: Former    Types: Cigarettes    Quit date: 11/22/2019    Years since quitting: 2.4   Smokeless tobacco: Never   Tobacco comments:    Patient reports quit 3 years ago. 06/25/20. [facility]  Vaping Use   Vaping Use: Never used  Substance and Sexual Activity   Alcohol use: Not Currently    Alcohol/week: 12.0 standard drinks of alcohol    Types: 12 Cans of beer per week    Comment: daily   Drug use: No   Sexual activity: Not Currently  Other Topics Concern   Not on file  Social History Narrative   Not on file   Social Determinants of Health   Financial Resource Strain: Not on file  Food Insecurity: Not on file  Transportation Needs: Unmet Transportation Needs (07/18/2021)   PRAPARE - Administrator, Civil Service (Medical): Yes    Lack of Transportation (Non-Medical): Yes  Physical Activity: Not on file  Stress: Not on file  Social Connections: Not on file  Intimate Partner Violence: Not on file    FAMILY HISTORY: Family History  Problem Relation Age of Onset   Cancer Cousin        maternal cousin   Cancer Cousin        paternal cousin   Cancer Cousin    Colon polyps Neg Hx    Pancreatic disease Neg Hx    Pancreatic cancer Neg Hx    Breast cancer Neg Hx    Colon cancer Neg Hx     ALLERGIES:  is allergic to morphine and  oxycodone.  MEDICATIONS:  Current Outpatient Medications  Medication Sig Dispense Refill   acetaminophen (TYLENOL) 325 MG tablet Take 2 tablets (650 mg total) by mouth every 6 (six) hours as needed for mild pain or headache (fever >/= 101).     albuterol (VENTOLIN HFA) 108 (90 Base) MCG/ACT inhaler Inhale 2 puffs into the lungs every 6 (six) hours as needed for wheezing or shortness of breath. 8 g 1   amLODipine (NORVASC) 10 MG tablet Take 1 tablet by mouth daily.     atorvastatin (LIPITOR) 20 MG tablet Take 20 mg by mouth daily.     cyanocobalamin (VITAMIN B12) 1000 MCG tablet Take 1 tablet (1,000 mcg total) by mouth daily. 90 tablet  1   ferrous sulfate 325 (65 FE) MG EC tablet Take 1 tablet (325 mg total) by mouth daily with breakfast. 30 tablet 3   folic acid (FOLVITE) 1 MG tablet Take 1 tablet (1 mg total) by mouth daily. 30 tablet 3   hydrOXYzine (ATARAX) 25 MG tablet Take 1 tablet (25 mg total) by mouth at bedtime as needed for itching. 30 tablet 11   ipratropium-albuterol (DUONEB) 0.5-2.5 (3) MG/3ML SOLN Take 3 mLs by nebulization every 6 (six) hours as needed. (Patient taking differently: Take 3 mLs by nebulization every 6 (six) hours as needed (shortness of breathing or wheezing).) 360 mL 0   lidocaine (LIDODERM) 5 % Place 1 patch onto the skin daily. Remove & Discard patch within 12 hours or as directed by MD. Apply to low back 30 patch 0   lidocaine-prilocaine (EMLA) cream Apply 1 Application topically as needed. 30 g 0   losartan (COZAAR) 100 MG tablet Take 100 mg by mouth daily.     olmesartan (BENICAR) 40 MG tablet Take 40 mg by mouth daily.     ondansetron (ZOFRAN) 8 MG tablet Take 1 tablet (8 mg total) by mouth every 8 (eight) hours as needed. 30 tablet 0   polyethylene glycol (MIRALAX) 17 g packet Take 17 g by mouth daily. 30 each 2   potassium chloride SA (KLOR-CON M) 20 MEQ tablet TAKE 1 TABLET(20 MEQ) BY MOUTH DAILY 90 tablet 0   prochlorperazine (COMPAZINE) 10 MG tablet Take  1 tablet (10 mg total) by mouth every 6 (six) hours as needed for nausea or vomiting. 30 tablet 0   senna-docusate (STIMULANT LAXATIVE) 8.6-50 MG tablet Take 2 tablets by mouth 2 (two) times daily. May add additional tablet as needed 150 tablet 3   thiamine 100 MG tablet Take 1 tablet (100 mg total) by mouth daily.     traMADol (ULTRAM) 50 MG tablet Take 1-2 tablets (50-100 mg total) by mouth every 6 (six) hours as needed. 90 tablet 0   No current facility-administered medications for this visit.    REVIEW OF SYSTEMS:   Constitutional: ( - ) fevers, ( - )  chills , ( - ) night sweats Eyes: ( - ) blurriness of vision, ( - ) double vision, ( - ) watery eyes Ears, nose, mouth, throat, and face: ( - ) mucositis, ( - ) sore throat Respiratory: ( - ) cough, ( - ) dyspnea, ( - ) wheezes Cardiovascular: ( - ) palpitation, ( - ) chest discomfort, ( - ) lower extremity swelling Gastrointestinal:  ( - ) nausea, ( - ) heartburn, ( - ) change in bowel habits Skin: ( - ) abnormal skin rashes Lymphatics: ( - ) new lymphadenopathy, ( - ) easy bruising Neurological: ( - ) numbness, ( - ) tingling, ( - ) new weaknesses Behavioral/Psych: ( - ) mood change, ( - ) new changes  All other systems were reviewed with the patient and are negative.  PHYSICAL EXAMINATION: ECOG PERFORMANCE STATUS: 1 - Symptomatic but completely ambulatory  Vitals:   04/28/22 0915  BP: 107/77  Pulse: 92  Resp: 16  Temp: (!) 97.3 F (36.3 C)  SpO2: 98%     Filed Weights   04/28/22 0915  Weight: 144 lb 8 oz (65.5 kg)      GENERAL: Well-appearing elderly African-American male, alert, no distress and comfortable SKIN: skin color, texture, turgor are normal, no rashes or significant lesions EYES: conjunctiva are pink and non-injected, sclera clear LUNGS:  clear to auscultation and percussion with normal breathing effort HEART: regular rate & rhythm and no murmurs and no lower extremity edema Musculoskeletal: no cyanosis  of digits and no clubbing  PSYCH: alert & oriented x 3, fluent speech NEURO: no focal motor/sensory deficits  LABORATORY DATA:  I have reviewed the data as listed    Latest Ref Rng & Units 04/28/2022    8:22 AM 04/10/2022   12:12 PM 03/16/2022    9:01 AM  CBC  WBC 4.0 - 10.5 K/uL 10.9  12.2  12.1   Hemoglobin 13.0 - 17.0 g/dL 9.5  9.5  9.4   Hematocrit 39.0 - 52.0 % 29.0  29.1  28.8   Platelets 150 - 400 K/uL 298  316  414        Latest Ref Rng & Units 04/28/2022    8:22 AM 04/10/2022   12:12 PM 03/16/2022    9:01 AM  CMP  Glucose 70 - 99 mg/dL 98  696  89   BUN 8 - 23 mg/dL 20  23  20    Creatinine 0.61 - 1.24 mg/dL 2.95  2.84  1.32   Sodium 135 - 145 mmol/L 127  128  128   Potassium 3.5 - 5.1 mmol/L 4.7  4.6  4.6   Chloride 98 - 111 mmol/L 100  102  100   CO2 22 - 32 mmol/L 21  19  21    Calcium 8.9 - 10.3 mg/dL 9.8  9.7  9.3   Total Protein 6.5 - 8.1 g/dL 8.8  8.8  8.7   Total Bilirubin 0.3 - 1.2 mg/dL 0.4  0.3  0.3   Alkaline Phos 38 - 126 U/L 70  62  73   AST 15 - 41 U/L 16  17  33   ALT 0 - 44 U/L 8  11  29      Lab Results  Component Value Date   MPROTEIN Not Observed 04/28/2021   Lab Results  Component Value Date   KPAFRELGTCHN 33.6 (H) 04/28/2021   LAMBDASER 22.0 04/28/2021   KAPLAMBRATIO 1.53 04/28/2021    RADIOGRAPHIC STUDIES: No results found.  ASSESSMENT & PLAN Terry Page. 67 y.o. male with medical history significant for newly diagnosed metastatic adenocarcinoma of the lung who presents for a follow up visit.  # Metastatic Adenocarcinoma of the Lung  -- NGS testing from this patient shows no evidence of targetable mutation. --MRI brain shows no evidence of intracranial metastases --Started carboplatin, pembrolizumab, and pemetrexed as treatment for his cancer on 07/08/2021. --Received palliative radiation to osseous metastasis of the left shoulder from 08/11/2021-08/24/2021. Planned dose is 30 Gy in 10 fx.  Plan: --today is Day 1 Cycle 14  today --labs from today were reviewed. Labs show white blood cell count 10.9, hemoglobin 9.5, MCV 96.3, and platelets of 298. Creatinine levels are continuing to improve to 1.23 --Proceed with treatment today but only with single agent pembrolizumab. Will continue to hold pemetrexed due to anemia and kidney dysfunction.  --CT C/A/P from 03/07/2022 showed stable disease. Next CT CAP due in June 2024.  --RTC in 3 weeks with labs, follow up visit to assess for Cycle 15 of chemotherapy  #Acute anemia: --Hgb level is stable at 9.5 --Currently on ferrous sulfate 325 mg PO daily --Repeat iron panel pending today.  --Gave stool cards to evaluate for GI bleeding, patient will drop off next week  #Left shoulder pain: --Secondary to left scapular metastasis.  --Most recent CT from 09/23/2021  showed stable osseous disease  #Pain Control --stopped oxycodone due to itching --currently following with Palliative care.  --pain well controlled with tramadol as needed. Continue to take tramdol 50-100 mg q 6 hours as needed --Continue senna docusate for constipation prophylaxis --Continue to monitor  #Supportive Care -- chemotherapy education complete -- port placed -- zofran 8mg  q8H PRN and compazine 10mg  PO q6H for nausea -- EMLA cream for port -- Patient referred to palliative care for pain control. Pain control as above.    No orders of the defined types were placed in this encounter.   All questions were answered. The patient knows to call the clinic with any problems, questions or concerns.  I have spent a total of 30 minutes minutes of face-to-face and non-face-to-face time, preparing to see the patient, performing a medically appropriate examination, counseling and educating the patient, referring and communicating with other health care professionals, documenting clinical information in the electronic health record, and care coordination.   Ulysees Barns, MD Department of  Hematology/Oncology [facility] Phone: [phone] Pager: [phone] Email: [email or web address]    04/28/2022 5:44 PM

## 2022-04-30 LAB — T4: T4, Total: 7.4 ug/dL (ref 4.5–12.0)

## 2022-05-01 ENCOUNTER — Telehealth: Payer: Self-pay | Admitting: Hematology and Oncology

## 2022-05-01 NOTE — Telephone Encounter (Signed)
Reached out to patient to make him aware of his added appointments, left voicemail.

## 2022-05-15 ENCOUNTER — Other Ambulatory Visit: Payer: Self-pay | Admitting: Nurse Practitioner

## 2022-05-15 ENCOUNTER — Telehealth: Payer: Self-pay | Admitting: *Deleted

## 2022-05-15 DIAGNOSIS — G893 Neoplasm related pain (acute) (chronic): Secondary | ICD-10-CM

## 2022-05-15 DIAGNOSIS — Z515 Encounter for palliative care: Secondary | ICD-10-CM

## 2022-05-15 DIAGNOSIS — C7951 Secondary malignant neoplasm of bone: Secondary | ICD-10-CM

## 2022-05-15 MED ORDER — TRAMADOL HCL 50 MG PO TABS
50.0000 mg | ORAL_TABLET | Freq: Four times a day (QID) | ORAL | 0 refills | Status: DC | PRN
Start: 2022-05-15 — End: 2022-06-08

## 2022-05-15 NOTE — Telephone Encounter (Signed)
Received vm message from pt requesting a message be sent to Nolon Bussing, NP regarding pain medication refills. Message sent

## 2022-05-17 NOTE — Progress Notes (Deleted)
Palliative Medicine [facility]  [phone] [phone]   Name: Terry Page. Date: 05/17/2022 MRN: [id number]  DOB: [birth year]  Patient Care Team: Aliene Beams, MD as PCP - General (Family Medicine)   INTERVAL HISTORY: Terry Page. is a 67 y.o. 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 He is currently undergoing chemotherapy. Palliative ask to see for symptom management.  SOCIAL HISTORY:    Terry Page reports that he quit smoking about 2 years ago. His smoking use included cigarettes. He has never used smokeless tobacco. He reports that he does not currently use alcohol after a past usage of about 12.0 standard drinks of alcohol per week. He reports that he does not use drugs.  ADVANCE DIRECTIVES:  None on file   CODE STATUS: Full Code  PAST MEDICAL HISTORY: Past Medical History:  Diagnosis Date   Alcohol abuse    Asthma    Bullous emphysema ([name])    Essential hypertension 04/10/2020   Hemorrhoids    History of radiation therapy 04/08/20-04/19/20   IMRT- Left lung- Dr. Antony Blackbird   Incidental lung nodule, greater than or equal to 8mm 10/22/2017   Left upper lobe - discovered on CTA   Prostate cancer ([name])    Spontaneous pneumothorax 10/20/2017   right   Tobacco abuse     ALLERGIES:  is allergic to morphine and oxycodone.  MEDICATIONS:  Current Outpatient Medications  Medication Sig Dispense Refill   acetaminophen (TYLENOL) 325 MG tablet Take 2 tablets (650 mg total) by mouth every 6 (six) hours as needed for mild pain or headache (fever >/= 101).     albuterol (VENTOLIN HFA) 108 (90 Base) MCG/ACT inhaler Inhale 2 puffs into the lungs every 6 (six) hours as needed for wheezing or shortness of breath. 8 g 1   amLODipine (NORVASC) 10 MG tablet Take 1 tablet by mouth  daily.     atorvastatin (LIPITOR) 20 MG tablet Take 20 mg by mouth daily.     cyanocobalamin (VITAMIN B12) 1000 MCG tablet Take 1 tablet (1,000 mcg total) by mouth daily. 90 tablet 1   ferrous sulfate 325 (65 FE) MG EC tablet Take 1 tablet (325 mg total) by mouth daily with breakfast. 30 tablet 3   folic acid (FOLVITE) 1 MG tablet Take 1 tablet (1 mg total) by mouth daily. 30 tablet 3   hydrOXYzine (ATARAX) 25 MG tablet Take 1 tablet (25 mg total) by mouth at bedtime as needed for itching. 30 tablet 11   ipratropium-albuterol (DUONEB) 0.5-2.5 (3) MG/3ML SOLN Take 3 mLs by nebulization every 6 (six) hours as needed. (Patient taking differently: Take 3 mLs by nebulization every 6 (six) hours as needed (shortness of breathing or wheezing).) 360 mL 0   lidocaine (LIDODERM) 5 % Place 1 patch onto the skin daily. Remove & Discard patch within 12 hours or as directed by MD. Apply to low back 30 patch 0   lidocaine-prilocaine (EMLA) cream Apply 1 Application topically as needed. 30 g 0   losartan (COZAAR) 100 MG tablet Take 100 mg by mouth daily.     olmesartan (BENICAR) 40 MG tablet Take 40 mg by mouth daily.     ondansetron (ZOFRAN) 8 MG tablet Take 1 tablet (8 mg total) by mouth every 8 (eight) hours as needed. 30 tablet 0   polyethylene glycol (  MIRALAX) 17 g packet Take 17 g by mouth daily. 30 each 2   potassium chloride SA (KLOR-CON M) 20 MEQ tablet TAKE 1 TABLET(20 MEQ) BY MOUTH DAILY 90 tablet 0   prochlorperazine (COMPAZINE) 10 MG tablet Take 1 tablet (10 mg total) by mouth every 6 (six) hours as needed for nausea or vomiting. 30 tablet 0   senna-docusate (STIMULANT LAXATIVE) 8.6-50 MG tablet Take 2 tablets by mouth 2 (two) times daily. May add additional tablet as needed 150 tablet 3   thiamine 100 MG tablet Take 1 tablet (100 mg total) by mouth daily.     traMADol (ULTRAM) 50 MG tablet Take 1-2 tablets (50-100 mg total) by mouth every 6 (six) hours as needed. 90 tablet 0   No current  facility-administered medications for this visit.    VITAL SIGNS: There were no vitals taken for this visit. There were no vitals filed for this visit.  Estimated body mass index is 21.97 kg/m as calculated from the following:   Height as of 11/16/21: 5\' 8"  (1.727 m).   Weight as of 04/28/22: 144 lb 8 oz (65.5 kg).   PERFORMANCE STATUS (ECOG) : 1 - Symptomatic but completely ambulatory   IMPRESSION:   Neoplasm related pain  [name] reports back and shoulder pain are well controlled on current regimen of Tramadol 50-100 mg as needed. [name] is taking Tramadol 50 mg approximately 3 times a day. With Tramadol, severe sharp and throbbing pains decrease to a mild, tolerable level. [name] appreciation for the current regimen.  No changes at this time. We will continue to closely monitor.   Constipation    We discussed the importance of continued conversation with family and their medical providers regarding overall plan of care and treatment options, ensuring decisions are within the context of the patients values and GOCs.   PLAN: Tramadol 50-100 mg every 6 hours as needed for pain. Senna-S daily for constipation. Miralax available if needed. Palliative will plan to see patient back in 3-4 weeks in collaboration with his other oncology appointments. Will give him a call next week for close symptom follow-up.    Patient expressed understanding and was in agreement with this plan. He also understands that He can call the clinic at any time with any questions, concerns, or complaints.    Any controlled substances utilized were prescribed in the context of palliative care. PDMP has been reviewed.    Visit consisted of counseling and education dealing with the complex and emotionally intense issues of symptom management and palliative care in the setting of serious and potentially life-threatening illness.Greater than 50%  of this time was spent counseling and coordinating  care related to the above assessment and plan.  [name], AGPCNP-BC  Palliative Medicine Team/[facility]  *Please note that this is a verbal dictation therefore any spelling or grammatical errors are due to the "[employer]agon [employer] One" system interpretation.

## 2022-05-19 ENCOUNTER — Ambulatory Visit: Payer: Medicare HMO

## 2022-05-19 ENCOUNTER — Encounter: Payer: Medicare HMO | Admitting: Nurse Practitioner

## 2022-05-19 ENCOUNTER — Telehealth: Payer: Self-pay | Admitting: *Deleted

## 2022-05-19 ENCOUNTER — Other Ambulatory Visit: Payer: Medicare HMO

## 2022-05-19 ENCOUNTER — Ambulatory Visit: Payer: Medicare HMO | Admitting: Physician Assistant

## 2022-05-19 ENCOUNTER — Telehealth: Payer: Self-pay | Admitting: Hematology and Oncology

## 2022-05-19 NOTE — Telephone Encounter (Signed)
Received call this am from pt. He is cancelling his appts today as he does not feel well. States he has been vomiting this morning. He does have his anti-nausea meds. Denies fever, chills, diarrhea. Advised to call back if symptoms do not subside.

## 2022-05-23 ENCOUNTER — Other Ambulatory Visit: Payer: Self-pay

## 2022-05-23 ENCOUNTER — Inpatient Hospital Stay: Payer: Medicare HMO

## 2022-05-23 ENCOUNTER — Inpatient Hospital Stay: Payer: Medicare HMO | Attending: Hematology and Oncology

## 2022-05-23 ENCOUNTER — Inpatient Hospital Stay (HOSPITAL_BASED_OUTPATIENT_CLINIC_OR_DEPARTMENT_OTHER): Payer: Medicare HMO | Admitting: Hematology and Oncology

## 2022-05-23 VITALS — BP 107/73 | HR 89 | Temp 97.7°F | Resp 16 | Wt 142.7 lb

## 2022-05-23 DIAGNOSIS — Z95828 Presence of other vascular implants and grafts: Secondary | ICD-10-CM

## 2022-05-23 DIAGNOSIS — Z5112 Encounter for antineoplastic immunotherapy: Secondary | ICD-10-CM | POA: Diagnosis present

## 2022-05-23 DIAGNOSIS — G893 Neoplasm related pain (acute) (chronic): Secondary | ICD-10-CM

## 2022-05-23 DIAGNOSIS — C61 Malignant neoplasm of prostate: Secondary | ICD-10-CM | POA: Insufficient documentation

## 2022-05-23 DIAGNOSIS — Z923 Personal history of irradiation: Secondary | ICD-10-CM | POA: Diagnosis not present

## 2022-05-23 DIAGNOSIS — R112 Nausea with vomiting, unspecified: Secondary | ICD-10-CM | POA: Insufficient documentation

## 2022-05-23 DIAGNOSIS — Z87891 Personal history of nicotine dependence: Secondary | ICD-10-CM | POA: Diagnosis not present

## 2022-05-23 DIAGNOSIS — Z79899 Other long term (current) drug therapy: Secondary | ICD-10-CM | POA: Diagnosis not present

## 2022-05-23 DIAGNOSIS — C3412 Malignant neoplasm of upper lobe, left bronchus or lung: Secondary | ICD-10-CM | POA: Diagnosis present

## 2022-05-23 DIAGNOSIS — C7951 Secondary malignant neoplasm of bone: Secondary | ICD-10-CM

## 2022-05-23 DIAGNOSIS — C3490 Malignant neoplasm of unspecified part of unspecified bronchus or lung: Secondary | ICD-10-CM

## 2022-05-23 LAB — CBC WITH DIFFERENTIAL (CANCER CENTER ONLY)
Abs Immature Granulocytes: 0.06 10*3/uL (ref 0.00–0.07)
Basophils Absolute: 0 10*3/uL (ref 0.0–0.1)
Basophils Relative: 0 %
Eosinophils Absolute: 0.2 10*3/uL (ref 0.0–0.5)
Eosinophils Relative: 2 %
HCT: 30.8 % — ABNORMAL LOW (ref 39.0–52.0)
Hemoglobin: 10.1 g/dL — ABNORMAL LOW (ref 13.0–17.0)
Immature Granulocytes: 1 %
Lymphocytes Relative: 13 %
Lymphs Abs: 1.2 10*3/uL (ref 0.7–4.0)
MCH: 31.2 pg (ref 26.0–34.0)
MCHC: 32.8 g/dL (ref 30.0–36.0)
MCV: 95.1 fL (ref 80.0–100.0)
Monocytes Absolute: 0.7 10*3/uL (ref 0.1–1.0)
Monocytes Relative: 7 %
Neutro Abs: 7.3 10*3/uL (ref 1.7–7.7)
Neutrophils Relative %: 77 %
Platelet Count: 317 10*3/uL (ref 150–400)
RBC: 3.24 MIL/uL — ABNORMAL LOW (ref 4.22–5.81)
RDW: 14.6 % (ref 11.5–15.5)
WBC Count: 9.5 10*3/uL (ref 4.0–10.5)
nRBC: 0 % (ref 0.0–0.2)

## 2022-05-23 LAB — CMP (CANCER CENTER ONLY)
ALT: 7 U/L (ref 0–44)
AST: 14 U/L — ABNORMAL LOW (ref 15–41)
Albumin: 4 g/dL (ref 3.5–5.0)
Alkaline Phosphatase: 71 U/L (ref 38–126)
Anion gap: 6 (ref 5–15)
BUN: 15 mg/dL (ref 8–23)
CO2: 23 mmol/L (ref 22–32)
Calcium: 9.4 mg/dL (ref 8.9–10.3)
Chloride: 100 mmol/L (ref 98–111)
Creatinine: 1.19 mg/dL (ref 0.61–1.24)
GFR, Estimated: >60 mL/min (ref >60)
Glucose, Bld: 139 mg/dL — ABNORMAL HIGH (ref 70–99)
Potassium: 4.5 mmol/L (ref 3.5–5.1)
Sodium: 129 mmol/L — ABNORMAL LOW (ref 135–145)
Total Bilirubin: 0.4 mg/dL (ref 0.3–1.2)
Total Protein: 8.6 g/dL — ABNORMAL HIGH (ref 6.5–8.1)

## 2022-05-23 LAB — TSH: TSH: 0.985 u[IU]/mL (ref 0.350–4.500)

## 2022-05-23 MED ORDER — SODIUM CHLORIDE 0.9% FLUSH
10.0000 mL | Freq: Once | INTRAVENOUS | Status: AC
  Administered 2022-05-23: 10 mL

## 2022-05-23 MED ORDER — SODIUM CHLORIDE 0.9% FLUSH
10.0000 mL | INTRAVENOUS | Status: DC | PRN
  Administered 2022-05-23: 10 mL

## 2022-05-23 MED ORDER — HEPARIN SOD (PORK) LOCK FLUSH 100 UNIT/ML IV SOLN
500.0000 [IU] | Freq: Once | INTRAVENOUS | Status: AC | PRN
  Administered 2022-05-23: 500 [IU]

## 2022-05-23 MED ORDER — SODIUM CHLORIDE 0.9 % IV SOLN: Freq: Once | INTRAVENOUS | Status: AC

## 2022-05-23 MED ORDER — PROCHLORPERAZINE MALEATE 10 MG PO TABS
10.0000 mg | ORAL_TABLET | Freq: Once | ORAL | Status: AC
  Administered 2022-05-23: 10 mg via ORAL
  Filled 2022-05-23: qty 1

## 2022-05-23 MED ORDER — SODIUM CHLORIDE 0.9 % IV SOLN
200.0000 mg | Freq: Once | INTRAVENOUS | Status: AC
  Administered 2022-05-23: 200 mg via INTRAVENOUS
  Filled 2022-05-23: qty 200

## 2022-05-23 NOTE — Progress Notes (Signed)
[facility] [phone]   [phone]  PROGRESS NOTE  Patient Care Team: Aliene Beams, MD as PCP - General (Family Medicine)  Hematological/Oncological History # Metastatic Adenocarcinoma of the Lung  01/07/2020 : CT of the chest with contrast performed which showed interval enlargement of a spiculated nodule in the central left upper lobe measuring 1.2 x 1.0 cm and highly suspicious for primary lung malignancy 02/06/2020 :PET scan performed which showed a hypermetabolic irregular solid 1.3 cm left upper lobe pulmonary nodule compatible with malignancy without any other hypermetabolic lesions 04/06/4096- 04/19/2020:  SBRT to the lung lesion 04/28/2021: CT C/A/P showed multifocal lytic bone metastases are identified. Lesion within the spine of the left scapula and lesions involving bilateral iliac bones and left sacral wing. Establish care with Dr. Leonides Schanz  05/03/2021: biopsy of right iliac lytic lesion showed metastatic carcinoma, consistent with lung primary.  07/08/2021: Cycle 1 Day 1 of Carbo/Pem/Pem 07/29/2021: Cycle 2 Day 1 of Carbo/Pem/Pem 08/11/2021-08/24/2021: Received palliative radiation to osseous metastasis in the left shoulder. 30 Gy in 10 fractions.  08/19/2021: Cycle 3 Day 1 of Carbo/Pem/Pem 09/09/2021: Cycle 4 Day 1 of Carbo/Pem/Pem 09/23/2021: CT CAP: stable disease 09/30/2021: Cycle 5 Day 1 of Pem/Pem 10/21/2021: Cycle 6 Day 1 of Pembrolizumab. Held pemetrexed due to anemia. 11/16/2021: Cycle 7 Day 1 of Pem/Pem.   12/16/2021: Cycle 8 Day 1 of Pem/Pem.   01/06/2022: Cycle 9 Day 1 of Pem/Pem.  01/27/2022: Cycle 10 Day 1 of Pem/Pem 02/17/2022: Cycle 11 Day 1 of Pem/Pem 03/10/2022: Cycle 12 Day 1 of Pem/Pem (HELD due to anemia with Hgb of 6.4) 03/17/2022: Cycle 12 Day 1 of Pem/Pem (HELD pemextrexed due to cytopenias/kidney function) 04/10/2022: Cycle 13 Day 1 of Pem/Pem (HELD pemextrexed due to cytopenias/kidney function) 04/28/2022: Cycle 14 Day 1 of Pem/Pem  (HELD pemextrexed due to cytopenias/kidney function) 05/23/2022:  Cycle 15 Day 1 of Pem/Pem (HELD pemextrexed due to cytopenias/kidney function)  #Adenocarcinoma of the Prostate, [id number] 08/19/2019-10/07/2019: 70 Gy in 28 fractions of 2.5 Gy.  Radiation to the prostate was under the care of Dr. Margaretmary Dys  Interval History:  Terry Page. 67 y.o. male with medical history significant for metastatic adenocarcinoma of the lung who presents for a follow up visit. The patient's last visit was on 04/28/2022. In the interim since the last visit he completed Cycle 14 with single agent pembrolizumab. He is unaccompanied for this visit.   On exam today, Terry Page reports he was unable to go to his last visit because he woke up at Friday morning at 2:30 AM and began vomiting.  He reports he vomited most of the morning.  He reports is in his usual and felt like a hangover.  He reports that he does not usually have nausea and vomiting throughout his treatment course.  He reports that he is not having any other infectious symptoms at this time.  He reports that when he began taking nausea medication the symptoms subsided.  He reports otherwise he has been eating well and his energy is good and he feels quite strong.  He does have a lump under his right nipple which causes occasional discomfort.  He denies fevers, chills, sweats, shortness of breath, chest pain or cough. He has no other complaints. A full 10 point ROS is listed below.  Overall he is willing and able to proceed with treatment at this time.  MEDICAL HISTORY:  Past Medical History:  Diagnosis Date   Alcohol abuse  Asthma    Bullous emphysema ([name])    Essential hypertension 04/10/2020   Hemorrhoids    History of radiation therapy 04/08/20-04/19/20   IMRT- Left lung- Dr. Antony Blackbird   Incidental lung nodule, greater than or equal to 8mm 10/22/2017   Left upper lobe - discovered on CTA   Prostate cancer ([name])    Spontaneous  pneumothorax 10/20/2017   right   Tobacco abuse     SURGICAL HISTORY: Past Surgical History:  Procedure Laterality Date   BACK SURGERY     IR IMAGING GUIDED PORT INSERTION  06/29/2021   PROSTATE BIOPSY      SOCIAL HISTORY: Social History   Socioeconomic History   Marital status: Single    Spouse name: Not on file   Number of children: Not on file   Years of education: Not on file   Highest education level: Not on file  Occupational History   Occupation: retired  Tobacco Use   Smoking status: Former    Types: Cigarettes    Quit date: 11/22/2019    Years since quitting: 2.5   Smokeless tobacco: Never   Tobacco comments:    Patient reports quit 3 years ago. 06/25/20. [facility]  Vaping Use   Vaping Use: Never used  Substance and Sexual Activity   Alcohol use: Not Currently    Alcohol/week: 12.0 standard drinks of alcohol    Types: 12 Cans of beer per week    Comment: daily   Drug use: No   Sexual activity: Not Currently  Other Topics Concern   Not on file  Social History Narrative   Not on file   Social Determinants of Health   Financial Resource Strain: Not on file  Food Insecurity: Not on file  Transportation Needs: Unmet Transportation Needs (07/18/2021)   PRAPARE - Administrator, Civil Service (Medical): Yes    Lack of Transportation (Non-Medical): Yes  Physical Activity: Not on file  Stress: Not on file  Social Connections: Not on file  Intimate Partner Violence: Not on file    FAMILY HISTORY: Family History  Problem Relation Age of Onset   Cancer Cousin        maternal cousin   Cancer Cousin        paternal cousin   Cancer Cousin    Colon polyps Neg Hx    Pancreatic disease Neg Hx    Pancreatic cancer Neg Hx    Breast cancer Neg Hx    Colon cancer Neg Hx     ALLERGIES:  is allergic to morphine and oxycodone.  MEDICATIONS:  Current Outpatient Medications  Medication Sig Dispense Refill   acetaminophen (TYLENOL) 325 MG tablet Take 2  tablets (650 mg total) by mouth every 6 (six) hours as needed for mild pain or headache (fever >/= 101).     amLODipine (NORVASC) 10 MG tablet Take 1 tablet by mouth daily.     atorvastatin (LIPITOR) 20 MG tablet Take 20 mg by mouth daily.     cyanocobalamin (VITAMIN B12) 1000 MCG tablet Take 1 tablet (1,000 mcg total) by mouth daily. 90 tablet 1   ferrous sulfate 325 (65 FE) MG EC tablet Take 1 tablet (325 mg total) by mouth daily with breakfast. 30 tablet 3   folic acid (FOLVITE) 1 MG tablet Take 1 tablet (1 mg total) by mouth daily. 30 tablet 3   hydrOXYzine (ATARAX) 25 MG tablet Take 1 tablet (25 mg total) by mouth at bedtime as needed for itching.  30 tablet 11   ipratropium-albuterol (DUONEB) 0.5-2.5 (3) MG/3ML SOLN Take 3 mLs by nebulization every 6 (six) hours as needed. (Patient taking differently: Take 3 mLs by nebulization every 6 (six) hours as needed (shortness of breathing or wheezing).) 360 mL 0   lidocaine (LIDODERM) 5 % Place 1 patch onto the skin daily. Remove & Discard patch within 12 hours or as directed by MD. Apply to low back 30 patch 0   lidocaine-prilocaine (EMLA) cream Apply 1 Application topically as needed. 30 g 0   losartan (COZAAR) 100 MG tablet Take 100 mg by mouth daily.     olmesartan (BENICAR) 40 MG tablet Take 40 mg by mouth daily.     ondansetron (ZOFRAN) 8 MG tablet Take 1 tablet (8 mg total) by mouth every 8 (eight) hours as needed. 30 tablet 0   polyethylene glycol (MIRALAX) 17 g packet Take 17 g by mouth daily. 30 each 2   potassium chloride SA (KLOR-CON M) 20 MEQ tablet TAKE 1 TABLET(20 MEQ) BY MOUTH DAILY 90 tablet 0   prochlorperazine (COMPAZINE) 10 MG tablet Take 1 tablet (10 mg total) by mouth every 6 (six) hours as needed for nausea or vomiting. 30 tablet 0   senna-docusate (STIMULANT LAXATIVE) 8.6-50 MG tablet Take 2 tablets by mouth 2 (two) times daily. May add additional tablet as needed 150 tablet 3   thiamine 100 MG tablet Take 1 tablet (100 mg  total) by mouth daily.     traMADol (ULTRAM) 50 MG tablet Take 1-2 tablets (50-100 mg total) by mouth every 6 (six) hours as needed. 90 tablet 0   albuterol (VENTOLIN HFA) 108 (90 Base) MCG/ACT inhaler Inhale 2 puffs into the lungs every 6 (six) hours as needed for wheezing or shortness of breath. 8 g 1   No current facility-administered medications for this visit.   Facility-Administered Medications Ordered in Other Visits  Medication Dose Route Frequency Provider Last Rate Last Admin   sodium chloride flush (NS) 0.9 % injection 10 mL  10 mL Intracatheter PRN Jaci Standard, MD   10 mL at 05/23/22 1412    REVIEW OF SYSTEMS:   Constitutional: ( - ) fevers, ( - )  chills , ( - ) night sweats Eyes: ( - ) blurriness of vision, ( - ) double vision, ( - ) watery eyes Ears, nose, mouth, throat, and face: ( - ) mucositis, ( - ) sore throat Respiratory: ( - ) cough, ( - ) dyspnea, ( - ) wheezes Cardiovascular: ( - ) palpitation, ( - ) chest discomfort, ( - ) lower extremity swelling Gastrointestinal:  ( - ) nausea, ( - ) heartburn, ( - ) change in bowel habits Skin: ( - ) abnormal skin rashes Lymphatics: ( - ) new lymphadenopathy, ( - ) easy bruising Neurological: ( - ) numbness, ( - ) tingling, ( - ) new weaknesses Behavioral/Psych: ( - ) mood change, ( - ) new changes  All other systems were reviewed with the patient and are negative.  PHYSICAL EXAMINATION: ECOG PERFORMANCE STATUS: 1 - Symptomatic but completely ambulatory  Vitals:   05/23/22 1201  BP: 107/73  Pulse: 89  Resp: 16  Temp: 97.7 F (36.5 C)  SpO2: 92%      Filed Weights   05/23/22 1201  Weight: 142 lb 11.2 oz (64.7 kg)       GENERAL: Well-appearing elderly African-American male, alert, no distress and comfortable SKIN: skin color, texture, turgor are normal, no rashes  or significant lesions EYES: conjunctiva are pink and non-injected, sclera clear LUNGS: clear to auscultation and percussion with normal  breathing effort HEART: regular rate & rhythm and no murmurs and no lower extremity edema Musculoskeletal: no cyanosis of digits and no clubbing  PSYCH: alert & oriented x 3, fluent speech NEURO: no focal motor/sensory deficits  LABORATORY DATA:  I have reviewed the data as listed    Latest Ref Rng & Units 05/23/2022   10:58 AM 04/28/2022    8:22 AM 04/10/2022   12:12 PM  CBC  WBC 4.0 - 10.5 K/uL 9.5  10.9  12.2   Hemoglobin 13.0 - 17.0 g/dL 52.8  9.5  9.5   Hematocrit 39.0 - 52.0 % 30.8  29.0  29.1   Platelets 150 - 400 K/uL 317  298  316        Latest Ref Rng & Units 05/23/2022   10:58 AM 04/28/2022    8:22 AM 04/10/2022   12:12 PM  CMP  Glucose 70 - 99 mg/dL 413  98  244   BUN 8 - 23 mg/dL 15  20  23    Creatinine 0.61 - 1.24 mg/dL 0.10  2.72  5.36   Sodium 135 - 145 mmol/L 129  127  128   Potassium 3.5 - 5.1 mmol/L 4.5  4.7  4.6   Chloride 98 - 111 mmol/L 100  100  102   CO2 22 - 32 mmol/L 23  21  19    Calcium 8.9 - 10.3 mg/dL 9.4  9.8  9.7   Total Protein 6.5 - 8.1 g/dL 8.6  8.8  8.8   Total Bilirubin 0.3 - 1.2 mg/dL 0.4  0.4  0.3   Alkaline Phos 38 - 126 U/L 71  70  62   AST 15 - 41 U/L 14  16  17    ALT 0 - 44 U/L 7  8  11      Lab Results  Component Value Date   MPROTEIN Not Observed 04/28/2021   Lab Results  Component Value Date   KPAFRELGTCHN 33.6 (H) 04/28/2021   LAMBDASER 22.0 04/28/2021   KAPLAMBRATIO 1.53 04/28/2021    RADIOGRAPHIC STUDIES: No results found.  ASSESSMENT & PLAN Franklin Bressan. 67 y.o. male with medical history significant for newly diagnosed metastatic adenocarcinoma of the lung who presents for a follow up visit.  # Metastatic Adenocarcinoma of the Lung  -- NGS testing from this patient shows no evidence of targetable mutation. --MRI brain shows no evidence of intracranial metastases --Started carboplatin, pembrolizumab, and pemetrexed as treatment for his cancer on 07/08/2021. --Received palliative radiation to osseous metastasis  of the left shoulder from 08/11/2021-08/24/2021. Planned dose is 30 Gy in 10 fx.  Plan: --today is Day 1 Cycle 15 today --labs from today were reviewed. Labs show white blood cell count 9.5, Hgb 10.1, MCV 95.1, Plt 317. Creatinine levels are continuing to improve to 1.19 --Proceed with treatment today but only with single agent pembrolizumab. Will continue to hold pemetrexed due to anemia and kidney dysfunction.  --CT C/A/P from 03/07/2022 showed stable disease. Next CT CAP due in June 2024.  --RTC in 3 weeks with labs, follow up visit to assess for Cycle 16 of chemotherapy  # Iron Deficiency Anemia --Hgb level is stable at 10.1 --Currently on ferrous sulfate 325 mg PO daily  #Left shoulder pain: --Secondary to left scapular metastasis.  --Most recent CT from 09/23/2021 showed stable osseous disease  #Pain Control --stopped oxycodone due  to itching --currently following with Palliative care.  --pain well controlled with tramadol as needed. Continue to take tramdol 50-100 mg q 6 hours as needed --Continue senna docusate for constipation prophylaxis --Continue to monitor  #Supportive Care -- chemotherapy education complete -- port placed -- zofran 8mg  q8H PRN and compazine 10mg  PO q6H for nausea -- EMLA cream for port -- Patient referred to palliative care for pain control. Pain control as above.    Orders Placed This Encounter  Procedures   CT CHEST ABDOMEN PELVIS W CONTRAST    Standing Status:   Future    Standing Expiration Date:   05/23/2023    Order Specific Question:   If indicated for the ordered procedure, I authorize the administration of contrast media per Radiology protocol    Answer:   Yes    Order Specific Question:   Does the patient have a contrast media/X-ray dye allergy?    Answer:   No    Order Specific Question:   Preferred imaging location?    Answer:   [facility]    Order Specific Question:   If indicated for the ordered procedure, I authorize the  administration of oral contrast media per Radiology protocol    Answer:   Yes   TSH    Standing Status:   Future    Standing Expiration Date:   08/11/2023   T4    Standing Status:   Future    Standing Expiration Date:   08/11/2023   CBC with Differential (Cancer Center Only)    Standing Status:   Future    Standing Expiration Date:   08/11/2023   CMP (Cancer Center only)    Standing Status:   Future    Standing Expiration Date:   08/11/2023   TSH    Standing Status:   Future    Standing Expiration Date:   09/01/2023   T4    Standing Status:   Future    Standing Expiration Date:   09/01/2023   CBC with Differential (Cancer Center Only)    Standing Status:   Future    Standing Expiration Date:   09/01/2023   CMP (Cancer Center only)    Standing Status:   Future    Standing Expiration Date:   09/01/2023    All questions were answered. The patient knows to call the clinic with any problems, questions or concerns.  I have spent a total of 30 minutes minutes of face-to-face and non-face-to-face time, preparing to see the patient, performing a medically appropriate examination, counseling and educating the patient, referring and communicating with other health care professionals, documenting clinical information in the electronic health record, and care coordination.   Ulysees Barns, MD Department of Hematology/Oncology [facility] Phone: [phone] Pager: [phone] Email: [email or web address]  05/23/2022 2:49 PM

## 2022-05-23 NOTE — Patient Instructions (Signed)
[facility]  Discharge Instructions: Thank you for choosing [facility] to provide your oncology and hematology care.   If you have a lab appointment with the Cancer Center, please go directly to the Cancer Center and check in at the registration area.   Wear comfortable clothing and clothing appropriate for easy access to any Portacath or PICC line.   We strive to give you quality time with your provider. You may need to reschedule your appointment if you arrive late (15 or more minutes).  Arriving late affects you and other patients whose appointments are after yours.  Also, if you miss three or more appointments without notifying the office, you may be dismissed from the clinic at the provider's discretion.      For prescription refill requests, have your pharmacy contact our office and allow 72 hours for refills to be completed.    Today you received the following chemotherapy and/or immunotherapy agents: Keytruda      To help prevent nausea and vomiting after your treatment, we encourage you to take your nausea medication as directed.  BELOW ARE SYMPTOMS THAT SHOULD BE REPORTED IMMEDIATELY: *FEVER GREATER THAN 100.4 F (38 C) OR HIGHER *CHILLS OR SWEATING *NAUSEA AND VOMITING THAT IS NOT CONTROLLED WITH YOUR NAUSEA MEDICATION *UNUSUAL SHORTNESS OF BREATH *UNUSUAL BRUISING OR BLEEDING *URINARY PROBLEMS (pain or burning when urinating, or frequent urination) *BOWEL PROBLEMS (unusual diarrhea, constipation, pain near the anus) TENDERNESS IN MOUTH AND THROAT WITH OR WITHOUT PRESENCE OF ULCERS (sore throat, sores in mouth, or a toothache) UNUSUAL RASH, SWELLING OR PAIN  UNUSUAL VAGINAL DISCHARGE OR ITCHING   Items with * indicate a potential emergency and should be followed up as soon as possible or go to the Emergency Department if any problems should occur.  Please show the CHEMOTHERAPY ALERT CARD or IMMUNOTHERAPY ALERT CARD at  check-in to the Emergency Department and triage nurse.  Should you have questions after your visit or need to cancel or reschedule your appointment, please contact [facility]  Dept: [phone]  and follow the prompts.  Office hours are 8:00 a.m. to 4:30 p.m. Monday - Friday. Please note that voicemails left after 4:00 p.m. may not be returned until the following business day.  We are closed weekends and major holidays. You have access to a nurse at all times for urgent questions. Please call the main number to the clinic Dept: [phone] and follow the prompts.   For any non-urgent questions, you may also contact your provider using MyChart. We now offer e-Visits for anyone 18 and older to request care online for non-urgent symptoms. For details visit [email or web address].   Also download the MyChart app! Go to the app store, search "MyChart", open the app, select [facility], and log in with your MyChart username and password.   

## 2022-05-25 LAB — T4: T4, Total: 6.3 ug/dL (ref 4.5–12.0)

## 2022-06-08 ENCOUNTER — Telehealth: Payer: Self-pay | Admitting: *Deleted

## 2022-06-08 ENCOUNTER — Ambulatory Visit: Payer: Medicare HMO

## 2022-06-08 ENCOUNTER — Other Ambulatory Visit: Payer: Medicare HMO

## 2022-06-08 ENCOUNTER — Ambulatory Visit: Payer: Medicare HMO | Admitting: Hematology and Oncology

## 2022-06-08 ENCOUNTER — Other Ambulatory Visit: Payer: Self-pay | Admitting: Nurse Practitioner

## 2022-06-08 DIAGNOSIS — G893 Neoplasm related pain (acute) (chronic): Secondary | ICD-10-CM

## 2022-06-08 DIAGNOSIS — C7951 Secondary malignant neoplasm of bone: Secondary | ICD-10-CM

## 2022-06-08 DIAGNOSIS — Z515 Encounter for palliative care: Secondary | ICD-10-CM

## 2022-06-08 MED ORDER — TRAMADOL HCL 50 MG PO TABS
50.0000 mg | ORAL_TABLET | Freq: Four times a day (QID) | ORAL | 0 refills | Status: DC | PRN
Start: 2022-06-08 — End: 2022-06-27

## 2022-06-08 NOTE — Telephone Encounter (Signed)
Received call from pt requesting refill of his Tramadol. Last filled on 05/15/22 for #90 tabs @ [facility] on [address]

## 2022-06-12 ENCOUNTER — Ambulatory Visit (HOSPITAL_COMMUNITY)
Admission: RE | Admit: 2022-06-12 | Discharge: 2022-06-12 | Disposition: A | Discharge status: Home / Self Care | Payer: Medicare HMO | Source: Ambulatory Visit | Attending: Hematology and Oncology | Admitting: Hematology and Oncology

## 2022-06-12 DIAGNOSIS — C7951 Secondary malignant neoplasm of bone: Secondary | ICD-10-CM | POA: Insufficient documentation

## 2022-06-12 DIAGNOSIS — C3490 Malignant neoplasm of unspecified part of unspecified bronchus or lung: Secondary | ICD-10-CM | POA: Diagnosis present

## 2022-06-12 MED ORDER — IOHEXOL 300 MG/ML  SOLN
100.0000 mL | Freq: Once | INTRAMUSCULAR | Status: AC | PRN
  Administered 2022-06-12: 100 mL via INTRAVENOUS

## 2022-06-12 MED ORDER — HEPARIN SOD (PORK) LOCK FLUSH 100 UNIT/ML IV SOLN
500.0000 [IU] | Freq: Once | INTRAVENOUS | Status: AC
  Administered 2022-06-12: 500 [IU] via INTRAVENOUS

## 2022-06-12 MED ORDER — SODIUM CHLORIDE (PF) 0.9 % IJ SOLN
INTRAMUSCULAR | Status: AC
  Filled 2022-06-12: qty 50

## 2022-06-12 MED ORDER — HEPARIN SOD (PORK) LOCK FLUSH 100 UNIT/ML IV SOLN
INTRAVENOUS | Status: AC
  Filled 2022-06-12: qty 5

## 2022-06-13 NOTE — Progress Notes (Signed)
Palliative Medicine [facility]  [phone]) [phone] [phone]   Name: Terry Page. Date: 06/13/2022 MRN: [id number]  DOB: September 02, 1955  Patient Care Team: Aliene Beams, MD as PCP - General (Family Medicine)   INTERVAL HISTORY: Terry Schack. is a 67 y.o. 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 He is currently undergoing chemotherapy. Palliative ask to see for symptom management.  SOCIAL HISTORY:    Terry Page reports that he quit smoking about 2 years ago. His smoking use included cigarettes. He has never used smokeless tobacco. He reports that he does not currently use alcohol after a past usage of about 12.0 standard drinks of alcohol per week. He reports that he does not use drugs.  ADVANCE DIRECTIVES:  None on file   CODE STATUS: Full Code  PAST MEDICAL HISTORY: Past Medical History:  Diagnosis Date   Alcohol abuse    Asthma    Bullous emphysema ([name])    Essential hypertension 04/10/2020   Hemorrhoids    History of radiation therapy 04/08/20-04/19/20   IMRT- Left lung- Dr. Antony Blackbird   Incidental lung nodule, greater than or equal to 8mm 10/22/2017   Left upper lobe - discovered on CTA   Prostate cancer ([name])    Spontaneous pneumothorax 10/20/2017   right   Tobacco abuse     ALLERGIES:  is allergic to morphine and oxycodone.  MEDICATIONS:  Current Outpatient Medications  Medication Sig Dispense Refill   acetaminophen (TYLENOL) 325 MG tablet Take 2 tablets (650 mg total) by mouth every 6 (six) hours as needed for mild pain or headache (fever >/= 101).     albuterol (VENTOLIN HFA) 108 (90 Base) MCG/ACT inhaler Inhale 2 puffs into the lungs every 6 (six) hours as needed for wheezing or shortness of breath. 8 g 1   amLODipine (NORVASC) 10 MG tablet Take 1 tablet by mouth  daily.     atorvastatin (LIPITOR) 20 MG tablet Take 20 mg by mouth daily.     cyanocobalamin (VITAMIN B12) 1000 MCG tablet Take 1 tablet (1,000 mcg total) by mouth daily. 90 tablet 1   ferrous sulfate 325 (65 FE) MG EC tablet Take 1 tablet (325 mg total) by mouth daily with breakfast. 30 tablet 3   folic acid (FOLVITE) 1 MG tablet Take 1 tablet (1 mg total) by mouth daily. 30 tablet 3   hydrOXYzine (ATARAX) 25 MG tablet Take 1 tablet (25 mg total) by mouth at bedtime as needed for itching. 30 tablet 11   ipratropium-albuterol (DUONEB) 0.5-2.5 (3) MG/3ML SOLN Take 3 mLs by nebulization every 6 (six) hours as needed. (Patient taking differently: Take 3 mLs by nebulization every 6 (six) hours as needed (shortness of breathing or wheezing).) 360 mL 0   lidocaine (LIDODERM) 5 % Place 1 patch onto the skin daily. Remove & Discard patch within 12 hours or as directed by MD. Apply to low back 30 patch 0   lidocaine-prilocaine (EMLA) cream Apply 1 Application topically as needed. 30 g 0   losartan (COZAAR) 100 MG tablet Take 100 mg by mouth daily.     olmesartan (BENICAR) 40 MG tablet Take 40 mg by mouth daily.     ondansetron (ZOFRAN) 8 MG tablet Take 1 tablet (8 mg total) by mouth every 8 (eight) hours as needed. 30 tablet 0   polyethylene glycol (  MIRALAX) 17 g packet Take 17 g by mouth daily. 30 each 2   potassium chloride SA (KLOR-CON M) 20 MEQ tablet TAKE 1 TABLET(20 MEQ) BY MOUTH DAILY 90 tablet 0   prochlorperazine (COMPAZINE) 10 MG tablet Take 1 tablet (10 mg total) by mouth every 6 (six) hours as needed for nausea or vomiting. 30 tablet 0   senna-docusate (STIMULANT LAXATIVE) 8.6-50 MG tablet Take 2 tablets by mouth 2 (two) times daily. May add additional tablet as needed 150 tablet 3   thiamine 100 MG tablet Take 1 tablet (100 mg total) by mouth daily.     traMADol (ULTRAM) 50 MG tablet Take 1-2 tablets (50-100 mg total) by mouth every 6 (six) hours as needed. 90 tablet 0   No current  facility-administered medications for this visit.    VITAL SIGNS: There were no vitals taken for this visit. There were no vitals filed for this visit.  Estimated body mass index is 21.7 kg/m as calculated from the following:   Height as of 11/16/21: 5\' 8"  (1.727 m).   Weight as of 05/23/22: 142 lb 11.2 oz (64.7 kg).   PERFORMANCE STATUS (ECOG) : 1 - Symptomatic but completely ambulatory  Assessment NAD RRR Normal breathing pattern AAO x4  IMPRESSION:  I saw Terry Page during his infusion. No acute distress. States he is doing well. Is trying to remain active. Shares he is trying to increase his daily steps and is currently keeping track with his watch to push himself.   Denies nausea, vomiting, diarrhea.  Neoplasm related pain  Terry Page reports back and shoulder pain are well controlled on current regimen of Tramadol 50-100 mg as needed. Tolerating without difficulty. Some days are better than others.   No changes at this time. We will continue to closely monitor.   Constipation  Controlled with daily regimen.   We discussed the importance of continued conversation with family and their medical providers regarding overall plan of care and treatment options, ensuring decisions are within the context of the patients values and GOCs.   PLAN: Tramadol 50-100 mg every 6 hours as needed for pain. Senna-S daily for constipation. Miralax available if needed. Palliative will plan to see patient back in 3-4 weeks in collaboration with his other oncology appointments. Will give him a call next week for close symptom follow-up.    Patient expressed understanding and was in agreement with this plan. He also understands that He can call the clinic at any time with any questions, concerns, or complaints.    Any controlled substances utilized were prescribed in the context of palliative care. PDMP has been reviewed.    Visit consisted of counseling and education dealing with the complex  and emotionally intense issues of symptom management and palliative care in the setting of serious and potentially life-threatening illness.Greater than 50%  of this time was spent counseling and coordinating care related to the above assessment and plan.  [name], AGPCNP-BC  Palliative Medicine Team/[facility]  *Please note that this is a verbal dictation therefore any spelling or grammatical errors are due to the "[employer] One" system interpretation.

## 2022-06-15 ENCOUNTER — Inpatient Hospital Stay (HOSPITAL_BASED_OUTPATIENT_CLINIC_OR_DEPARTMENT_OTHER): Payer: Medicare HMO | Admitting: Nurse Practitioner

## 2022-06-15 ENCOUNTER — Encounter: Payer: Self-pay | Admitting: Nurse Practitioner

## 2022-06-15 ENCOUNTER — Inpatient Hospital Stay: Payer: Medicare HMO

## 2022-06-15 ENCOUNTER — Inpatient Hospital Stay: Payer: Medicare HMO | Attending: Hematology and Oncology

## 2022-06-15 ENCOUNTER — Inpatient Hospital Stay: Payer: Medicare HMO | Admitting: Hematology and Oncology

## 2022-06-15 VITALS — BP 137/81 | HR 76 | Temp 97.2°F | Resp 14 | Wt 145.5 lb

## 2022-06-15 VITALS — BP 110/76 | HR 75 | Resp 16

## 2022-06-15 DIAGNOSIS — C7951 Secondary malignant neoplasm of bone: Secondary | ICD-10-CM

## 2022-06-15 DIAGNOSIS — R112 Nausea with vomiting, unspecified: Secondary | ICD-10-CM | POA: Diagnosis not present

## 2022-06-15 DIAGNOSIS — Z515 Encounter for palliative care: Secondary | ICD-10-CM

## 2022-06-15 DIAGNOSIS — Z79620 Long term (current) use of immunosuppressive biologic: Secondary | ICD-10-CM | POA: Insufficient documentation

## 2022-06-15 DIAGNOSIS — Z79899 Other long term (current) drug therapy: Secondary | ICD-10-CM | POA: Insufficient documentation

## 2022-06-15 DIAGNOSIS — Z5112 Encounter for antineoplastic immunotherapy: Secondary | ICD-10-CM | POA: Diagnosis present

## 2022-06-15 DIAGNOSIS — Z87891 Personal history of nicotine dependence: Secondary | ICD-10-CM | POA: Diagnosis not present

## 2022-06-15 DIAGNOSIS — Z95828 Presence of other vascular implants and grafts: Secondary | ICD-10-CM

## 2022-06-15 DIAGNOSIS — K5903 Drug induced constipation: Secondary | ICD-10-CM

## 2022-06-15 DIAGNOSIS — Z923 Personal history of irradiation: Secondary | ICD-10-CM | POA: Diagnosis not present

## 2022-06-15 DIAGNOSIS — G893 Neoplasm related pain (acute) (chronic): Secondary | ICD-10-CM

## 2022-06-15 DIAGNOSIS — C3490 Malignant neoplasm of unspecified part of unspecified bronchus or lung: Secondary | ICD-10-CM

## 2022-06-15 DIAGNOSIS — C3412 Malignant neoplasm of upper lobe, left bronchus or lung: Secondary | ICD-10-CM

## 2022-06-15 DIAGNOSIS — C61 Malignant neoplasm of prostate: Secondary | ICD-10-CM | POA: Diagnosis not present

## 2022-06-15 LAB — CBC WITH DIFFERENTIAL (CANCER CENTER ONLY)
Abs Immature Granulocytes: 0.12 10*3/uL — ABNORMAL HIGH (ref 0.00–0.07)
Basophils Absolute: 0.1 10*3/uL (ref 0.0–0.1)
Basophils Relative: 1 %
Eosinophils Absolute: 0.3 10*3/uL (ref 0.0–0.5)
Eosinophils Relative: 2 %
HCT: 29.7 % — ABNORMAL LOW (ref 39.0–52.0)
Hemoglobin: 10.1 g/dL — ABNORMAL LOW (ref 13.0–17.0)
Immature Granulocytes: 1 %
Lymphocytes Relative: 22 %
Lymphs Abs: 2.4 10*3/uL (ref 0.7–4.0)
MCH: 31.9 pg (ref 26.0–34.0)
MCHC: 34 g/dL (ref 30.0–36.0)
MCV: 93.7 fL (ref 80.0–100.0)
Monocytes Absolute: 1.3 10*3/uL — ABNORMAL HIGH (ref 0.1–1.0)
Monocytes Relative: 13 %
Neutro Abs: 6.6 10*3/uL (ref 1.7–7.7)
Neutrophils Relative %: 61 %
Platelet Count: 318 10*3/uL (ref 150–400)
RBC: 3.17 MIL/uL — ABNORMAL LOW (ref 4.22–5.81)
RDW: 14 % (ref 11.5–15.5)
WBC Count: 10.7 10*3/uL — ABNORMAL HIGH (ref 4.0–10.5)
nRBC: 0 % (ref 0.0–0.2)

## 2022-06-15 LAB — CMP (CANCER CENTER ONLY)
ALT: 9 U/L (ref 0–44)
AST: 14 U/L — ABNORMAL LOW (ref 15–41)
Albumin: 3.9 g/dL (ref 3.5–5.0)
Alkaline Phosphatase: 65 U/L (ref 38–126)
Anion gap: 7 (ref 5–15)
BUN: 17 mg/dL (ref 8–23)
CO2: 23 mmol/L (ref 22–32)
Calcium: 9.7 mg/dL (ref 8.9–10.3)
Chloride: 97 mmol/L — ABNORMAL LOW (ref 98–111)
Creatinine: 1.07 mg/dL (ref 0.61–1.24)
GFR, Estimated: >60 mL/min (ref >60)
Glucose, Bld: 104 mg/dL — ABNORMAL HIGH (ref 70–99)
Potassium: 4.4 mmol/L (ref 3.5–5.1)
Sodium: 127 mmol/L — ABNORMAL LOW (ref 135–145)
Total Bilirubin: 0.4 mg/dL (ref 0.3–1.2)
Total Protein: 8.9 g/dL — ABNORMAL HIGH (ref 6.5–8.1)

## 2022-06-15 LAB — TSH: TSH: 2.542 u[IU]/mL (ref 0.350–4.500)

## 2022-06-15 MED ORDER — SODIUM CHLORIDE 0.9 % IV SOLN
200.0000 mg | Freq: Once | INTRAVENOUS | Status: AC
  Administered 2022-06-15: 200 mg via INTRAVENOUS
  Filled 2022-06-15: qty 200

## 2022-06-15 MED ORDER — HEPARIN SOD (PORK) LOCK FLUSH 100 UNIT/ML IV SOLN
500.0000 [IU] | Freq: Once | INTRAVENOUS | Status: AC | PRN
  Administered 2022-06-15: 500 [IU]

## 2022-06-15 MED ORDER — SODIUM CHLORIDE 0.9% FLUSH
10.0000 mL | INTRAVENOUS | Status: DC | PRN
  Administered 2022-06-15: 10 mL

## 2022-06-15 MED ORDER — SODIUM CHLORIDE 0.9% FLUSH
10.0000 mL | Freq: Once | INTRAVENOUS | Status: AC
  Administered 2022-06-15: 10 mL

## 2022-06-15 MED ORDER — SODIUM CHLORIDE 0.9 % IV SOLN: Freq: Once | INTRAVENOUS | Status: AC

## 2022-06-15 NOTE — Progress Notes (Signed)
[facility] [phone]   [phone]  PROGRESS NOTE  Patient Care Team: Aliene Beams, MD as PCP - General (Family Medicine)  Hematological/Oncological History # Metastatic Adenocarcinoma of the Lung  01/07/2020 : CT of the chest with contrast performed which showed interval enlargement of a spiculated nodule in the central left upper lobe measuring 1.2 x 1.0 cm and highly suspicious for primary lung malignancy 02/06/2020 :PET scan performed which showed a hypermetabolic irregular solid 1.3 cm left upper lobe pulmonary nodule compatible with malignancy without any other hypermetabolic lesions 01/06/7827- 04/19/2020:  SBRT to the lung lesion 04/28/2021: CT C/A/P showed multifocal lytic bone metastases are identified. Lesion within the spine of the left scapula and lesions involving bilateral iliac bones and left sacral wing. Establish care with Dr. Leonides Schanz  05/03/2021: biopsy of right iliac lytic lesion showed metastatic carcinoma, consistent with lung primary.  07/08/2021: Cycle 1 Day 1 of Carbo/Pem/Pem 07/29/2021: Cycle 2 Day 1 of Carbo/Pem/Pem 08/11/2021-08/24/2021: Received palliative radiation to osseous metastasis in the left shoulder. 30 Gy in 10 fractions.  08/19/2021: Cycle 3 Day 1 of Carbo/Pem/Pem 09/09/2021: Cycle 4 Day 1 of Carbo/Pem/Pem 09/23/2021: CT CAP: stable disease 09/30/2021: Cycle 5 Day 1 of Pem/Pem 10/21/2021: Cycle 6 Day 1 of Pembrolizumab. Held pemetrexed due to anemia. 11/16/2021: Cycle 7 Day 1 of Pem/Pem.   12/16/2021: Cycle 8 Day 1 of Pem/Pem.   01/06/2022: Cycle 9 Day 1 of Pem/Pem.  01/27/2022: Cycle 10 Day 1 of Pem/Pem 02/17/2022: Cycle 11 Day 1 of Pem/Pem 03/10/2022: Cycle 12 Day 1 of Pem/Pem (HELD due to anemia with Hgb of 6.4) 03/17/2022: Cycle 12 Day 1 of Pem/Pem (HELD pemextrexed due to cytopenias/kidney function) 04/10/2022: Cycle 13 Day 1 of Pem/Pem (HELD pemextrexed due to cytopenias/kidney function) 04/28/2022: Cycle 14 Day 1 of Pem/Pem  (HELD pemextrexed due to cytopenias/kidney function) 05/23/2022:  Cycle 15 Day 1 of Pem/Pem (HELD pemextrexed due to cytopenias/kidney function) 06/15/2022: Cycle 16 Day 1 of Pembrolizumab.   #Adenocarcinoma of the Prostate, T1cN0M0 08/19/2019-10/07/2019: 70 Gy in 28 fractions of 2.5 Gy.  Radiation to the prostate was under the care of Dr. Margaretmary Dys  Interval History:  [name]. 67 y.o. male with medical history significant for metastatic adenocarcinoma of the lung who presents for a follow up visit. The patient's last visit was on 05/23/2022. In the interim since the last visit he completed Cycle 15 with single agent pembrolizumab. He is unaccompanied for this visit.   On exam today, Mr. Galindo reports in the interim since her last visit he unfortunate developed a dental abscess and had 2 fillings fallout.  He notes that it was painful but unfortunate has not yet been started on antibiotics.  He is not having any systemic symptoms concerning for systemic infection.  He reports other than this he has been doing well.  He is tolerating treatment without any major side effects.  He is not having any nausea, vomiting, or diarrhea.  His energy levels are strong and he currently ports about a 9 out of 10.  He is able to cook and clean without difficulty and has a strong appetite.  His weight is increasing is up to 145 pounds, up from 142 pounds during his last visit.  He notes that his pain is currently under good control with tramadol.  He notes that he normally takes about 3-4 times per day.  Overall he feels well and is willing and able to proceed with treatment at this time.  He denies fevers, chills, sweats, shortness of breath, chest pain or cough. He has no other complaints. A full 10 point ROS is listed below.  Overall he is willing and able to proceed with treatment at this time.  MEDICAL HISTORY:  Past Medical History:  Diagnosis Date   Alcohol abuse    Asthma    Bullous emphysema  ([name])    Essential hypertension 04/10/2020   Hemorrhoids    History of radiation therapy 04/08/20-04/19/20   IMRT- Left lung- Dr. Antony Blackbird   Incidental lung nodule, greater than or equal to 8mm 10/22/2017   Left upper lobe - discovered on CTA   Prostate cancer ([name])    Spontaneous pneumothorax 10/20/2017   right   Tobacco abuse     SURGICAL HISTORY: Past Surgical History:  Procedure Laterality Date   BACK SURGERY     IR IMAGING GUIDED PORT INSERTION  06/29/2021   PROSTATE BIOPSY      SOCIAL HISTORY: Social History   Socioeconomic History   Marital status: Single    Spouse name: Not on file   Number of children: Not on file   Years of education: Not on file   Highest education level: Not on file  Occupational History   Occupation: retired  Tobacco Use   Smoking status: Former    Types: Cigarettes    Quit date: 11/22/2019    Years since quitting: 2.5   Smokeless tobacco: Never   Tobacco comments:    Patient reports quit 3 years ago. 06/25/20. [facility]  Vaping Use   Vaping Use: Never used  Substance and Sexual Activity   Alcohol use: Not Currently    Alcohol/week: 12.0 standard drinks of alcohol    Types: 12 Cans of beer per week    Comment: daily   Drug use: No   Sexual activity: Not Currently  Other Topics Concern   Not on file  Social History Narrative   Not on file   Social Determinants of Health   Financial Resource Strain: Not on file  Food Insecurity: Not on file  Transportation Needs: Unmet Transportation Needs (07/18/2021)   PRAPARE - Administrator, Civil Service (Medical): Yes    Lack of Transportation (Non-Medical): Yes  Physical Activity: Not on file  Stress: Not on file  Social Connections: Not on file  Intimate Partner Violence: Not on file    FAMILY HISTORY: Family History  Problem Relation Age of Onset   Cancer Cousin        maternal cousin   Cancer Cousin        paternal cousin   Cancer Cousin    Colon polyps Neg Hx     Pancreatic disease Neg Hx    Pancreatic cancer Neg Hx    Breast cancer Neg Hx    Colon cancer Neg Hx     ALLERGIES:  is allergic to morphine and oxycodone.  MEDICATIONS:  Current Outpatient Medications  Medication Sig Dispense Refill   acetaminophen (TYLENOL) 325 MG tablet Take 2 tablets (650 mg total) by mouth every 6 (six) hours as needed for mild pain or headache (fever >/= 101).     albuterol (VENTOLIN HFA) 108 (90 Base) MCG/ACT inhaler Inhale 2 puffs into the lungs every 6 (six) hours as needed for wheezing or shortness of breath. 8 g 1   amLODipine (NORVASC) 10 MG tablet Take 1 tablet by mouth daily.     atorvastatin (LIPITOR) 20 MG tablet Take 20 mg by mouth daily.  cyanocobalamin (VITAMIN B12) 1000 MCG tablet Take 1 tablet (1,000 mcg total) by mouth daily. 90 tablet 1   ferrous sulfate 325 (65 FE) MG EC tablet Take 1 tablet (325 mg total) by mouth daily with breakfast. 30 tablet 3   folic acid (FOLVITE) 1 MG tablet Take 1 tablet (1 mg total) by mouth daily. 30 tablet 3   hydrOXYzine (ATARAX) 25 MG tablet Take 1 tablet (25 mg total) by mouth at bedtime as needed for itching. 30 tablet 11   ipratropium-albuterol (DUONEB) 0.5-2.5 (3) MG/3ML SOLN Take 3 mLs by nebulization every 6 (six) hours as needed. (Patient taking differently: Take 3 mLs by nebulization every 6 (six) hours as needed (shortness of breathing or wheezing).) 360 mL 0   lidocaine (LIDODERM) 5 % Place 1 patch onto the skin daily. Remove & Discard patch within 12 hours or as directed by MD. Apply to low back 30 patch 0   lidocaine-prilocaine (EMLA) cream Apply 1 Application topically as needed. 30 g 0   losartan (COZAAR) 100 MG tablet Take 100 mg by mouth daily.     olmesartan (BENICAR) 40 MG tablet Take 40 mg by mouth daily.     ondansetron (ZOFRAN) 8 MG tablet Take 1 tablet (8 mg total) by mouth every 8 (eight) hours as needed. 30 tablet 0   polyethylene glycol (MIRALAX) 17 g packet Take 17 g by mouth daily. 30 each 2    potassium chloride SA (KLOR-CON M) 20 MEQ tablet TAKE 1 TABLET(20 MEQ) BY MOUTH DAILY 90 tablet 0   prochlorperazine (COMPAZINE) 10 MG tablet Take 1 tablet (10 mg total) by mouth every 6 (six) hours as needed for nausea or vomiting. 30 tablet 0   senna-docusate (STIMULANT LAXATIVE) 8.6-50 MG tablet Take 2 tablets by mouth 2 (two) times daily. May add additional tablet as needed 150 tablet 3   thiamine 100 MG tablet Take 1 tablet (100 mg total) by mouth daily.     traMADol (ULTRAM) 50 MG tablet Take 1-2 tablets (50-100 mg total) by mouth every 6 (six) hours as needed. 90 tablet 0   No current facility-administered medications for this visit.    REVIEW OF SYSTEMS:   Constitutional: ( - ) fevers, ( - )  chills , ( - ) night sweats Eyes: ( - ) blurriness of vision, ( - ) double vision, ( - ) watery eyes Ears, nose, mouth, throat, and face: ( - ) mucositis, ( - ) sore throat Respiratory: ( - ) cough, ( - ) dyspnea, ( - ) wheezes Cardiovascular: ( - ) palpitation, ( - ) chest discomfort, ( - ) lower extremity swelling Gastrointestinal:  ( - ) nausea, ( - ) heartburn, ( - ) change in bowel habits Skin: ( - ) abnormal skin rashes Lymphatics: ( - ) new lymphadenopathy, ( - ) easy bruising Neurological: ( - ) numbness, ( - ) tingling, ( - ) new weaknesses Behavioral/Psych: ( - ) mood change, ( - ) new changes  All other systems were reviewed with the patient and are negative.  PHYSICAL EXAMINATION: ECOG PERFORMANCE STATUS: 1 - Symptomatic but completely ambulatory  Vitals:   06/15/22 0950  BP: 137/81  Pulse: 76  Resp: 14  Temp: (!) 97.2 F (36.2 C)  SpO2: 99%       Filed Weights   06/15/22 0950  Weight: 145 lb 8 oz (66 kg)        GENERAL: Well-appearing elderly African-American male, alert, no distress and comfortable  SKIN: skin color, texture, turgor are normal, no rashes or significant lesions EYES: conjunctiva are pink and non-injected, sclera clear LUNGS: clear to  auscultation and percussion with normal breathing effort HEART: regular rate & rhythm and no murmurs and no lower extremity edema Musculoskeletal: no cyanosis of digits and no clubbing  PSYCH: alert & oriented x 3, fluent speech NEURO: no focal motor/sensory deficits  LABORATORY DATA:  I have reviewed the data as listed    Latest Ref Rng & Units 06/15/2022    9:29 AM 05/23/2022   10:58 AM 04/28/2022    8:22 AM  CBC  WBC 4.0 - 10.5 K/uL 10.7  9.5  10.9   Hemoglobin 13.0 - 17.0 g/dL 32.3  55.7  9.5   Hematocrit 39.0 - 52.0 % 29.7  30.8  29.0   Platelets 150 - 400 K/uL 318  317  298        Latest Ref Rng & Units 06/15/2022    9:29 AM 05/23/2022   10:58 AM 04/28/2022    8:22 AM  CMP  Glucose 70 - 99 mg/dL 322  025  98   BUN 8 - 23 mg/dL 17  15  20    Creatinine 0.61 - 1.24 mg/dL 4.27  0.62  3.76   Sodium 135 - 145 mmol/L 127  129  127   Potassium 3.5 - 5.1 mmol/L 4.4  4.5  4.7   Chloride 98 - 111 mmol/L 97  100  100   CO2 22 - 32 mmol/L 23  23  21    Calcium 8.9 - 10.3 mg/dL 9.7  9.4  9.8   Total Protein 6.5 - 8.1 g/dL 8.9  8.6  8.8   Total Bilirubin 0.3 - 1.2 mg/dL 0.4  0.4  0.4   Alkaline Phos 38 - 126 U/L 65  71  70   AST 15 - 41 U/L 14  14  16    ALT 0 - 44 U/L 9  7  8      Lab Results  Component Value Date   MPROTEIN Not Observed 04/28/2021   Lab Results  Component Value Date   KPAFRELGTCHN 33.6 (H) 04/28/2021   LAMBDASER 22.0 04/28/2021   KAPLAMBRATIO 1.53 04/28/2021    RADIOGRAPHIC STUDIES: CT CHEST ABDOMEN PELVIS W CONTRAST  Result Date: 06/15/2022 CLINICAL DATA:  Non-small-cell lung cancer. Evaluate treatment response. Alcohol abuse. Status post radiation therapy in 2023. Chemotherapy earlier this year. Remote history of prostate cancer. * Tracking Code: BO * EXAM: CT CHEST, ABDOMEN, AND PELVIS WITH CONTRAST TECHNIQUE: Multidetector CT imaging of the chest, abdomen and pelvis was performed following the standard protocol during bolus administration of intravenous  contrast. RADIATION DOSE REDUCTION: This exam was performed according to the departmental dose-optimization program which includes automated exposure control, adjustment of the mA and/or kV according to patient size and/or use of iterative reconstruction technique. CONTRAST:  OMNIPAQUE IOHEXOL 300 MG/ML  SOLN COMPARISON:  03/07/2022 FINDINGS: CT CHEST FINDINGS Cardiovascular: Right Port-A-Cath tip high right atrium. Advanced aortic and branch vessel atherosclerosis. Normal heart size, without pericardial effusion. Three vessel coronary artery calcification. No central pulmonary embolism, on this non-dedicated study. Mediastinum/Nodes: No supraclavicular adenopathy. No mediastinal or hilar adenopathy. Lungs/Pleura: No pleural fluid. Advanced centrilobular and paraseptal emphysema. Right lower lobe 4 mm pulmonary nodule on 106/6, new. Left suprahilar/posterior upper lobe presumably radiation induced architectural distortion and volume loss including on 61/6, similar. Left posterolateral upper lobe area of presumably radiation induced consolidation is similar, including on 43/6. Musculoskeletal: Dominant  mixed attenuation left scapular metastasis is relatively similar. Mild T12 superior endplate compression deformity is unchanged. CT ABDOMEN PELVIS FINDINGS Hepatobiliary: Mild caudate lobe enlargement. Bilateral hepatic cysts and too small to characterize lesions, similar in size and configuration to the prior. Normal gallbladder, without biliary ductal dilatation. Pancreas: Normal, without mass or ductal dilatation. Spleen: Diminutive spleen. Adrenals/Urinary Tract: Normal adrenal glands. Bilateral renal cysts. Too small to characterize renal lesions are also most likely cysts . In the absence of clinically indicated signs/symptoms require(s) no independent follow-up. No hydronephrosis. Normal urinary bladder. Stomach/Bowel: Normal stomach, without wall thickening. Normal colon, appendix, and terminal ileum.  Normal small bowel. Vascular/Lymphatic: Aortic atherosclerosis. No abdominopelvic adenopathy. Reproductive: Normal prostate. Other: No significant free fluid. No evidence of omental or peritoneal disease. Musculoskeletal: Relatively diffuse sclerotic osseous metastasis again identified, including throughout the left sacrum, bilateral iliac bones. IMPRESSION: 1. New 4 mm right lower lobe pulmonary nodule, suspicious for metastatic disease. 2. Similar appearance of left upper lobe radiation change. 3. Similar widespread osseous metastasis. 4. No soft tissue metastasis within the abdomen or pelvis. 5. Aortic atherosclerosis (ICD10-I70.0), coronary artery atherosclerosis and emphysema (ICD10-J43.9). Electronically Signed   By: Jeronimo Greaves M.D.   On: 06/15/2022 08:43    ASSESSMENT & PLAN Alyus Mofield. 67 y.o. male with medical history significant for newly diagnosed metastatic adenocarcinoma of the lung who presents for a follow up visit.  # Metastatic Adenocarcinoma of the Lung  -- NGS testing from this patient shows no evidence of targetable mutation. --MRI brain shows no evidence of intracranial metastases --Started carboplatin, pembrolizumab, and pemetrexed as treatment for his cancer on 07/08/2021. --Received palliative radiation to osseous metastasis of the left shoulder from 08/11/2021-08/24/2021. Planned dose is 30 Gy in 10 fx.  Plan: --today is Day 1 Cycle 16 today --labs from today were reviewed. Labs show white blood cell count 10.7, hemoglobin 10.1, MCV 93.7, and platelets of 318 --Proceed with treatment today but only with single agent pembrolizumab. Will continue to hold pemetrexed due to anemia and kidney dysfunction.  --CT C/A/P from 06/12/2022 currently pending review.  --RTC in 3 weeks with labs, follow up visit to assess for Cycle 17 of chemotherapy  # Iron Deficiency Anemia --Hgb level is stable at 10.1  --Currently on ferrous sulfate 325 mg PO daily  #Left shoulder  pain: --Secondary to left scapular metastasis.  --Most recent CT from 09/23/2021 showed stable osseous disease  #Pain Control --stopped oxycodone due to itching --currently following with Palliative care.  --pain well controlled with tramadol as needed. Continue to take tramdol 50-100 mg q 6 hours as needed --Continue senna docusate for constipation prophylaxis --Continue to monitor  #Supportive Care -- chemotherapy education complete -- port placed -- zofran 8mg  q8H PRN and compazine 10mg  PO q6H for nausea -- EMLA cream for port -- Patient referred to palliative care for pain control. Pain control as above.    No orders of the defined types were placed in this encounter.   All questions were answered. The patient knows to call the clinic with any problems, questions or concerns.  I have spent a total of 30 minutes minutes of face-to-face and non-face-to-face time, preparing to see the patient, performing a medically appropriate examination, counseling and educating the patient, referring and communicating with other health care professionals, documenting clinical information in the electronic health record, and care coordination.   Ulysees Barns, MD Department of Hematology/Oncology [facility] at [facility] Phone: [phone] Pager: [phone]  Email: [email or web address]  06/25/2022 4:31 PM

## 2022-06-15 NOTE — Patient Instructions (Signed)
[facility]  Discharge Instructions: Thank you for choosing [facility] to provide your oncology and hematology care.   If you have a lab appointment with the Cancer Center, please go directly to the Cancer Center and check in at the registration area.   Wear comfortable clothing and clothing appropriate for easy access to any Portacath or PICC line.   We strive to give you quality time with your provider. You may need to reschedule your appointment if you arrive late (15 or more minutes).  Arriving late affects you and other patients whose appointments are after yours.  Also, if you miss three or more appointments without notifying the office, you may be dismissed from the clinic at the provider's discretion.      For prescription refill requests, have your pharmacy contact our office and allow 72 hours for refills to be completed.    Today you received the following chemotherapy and/or immunotherapy agents: Keytruda      To help prevent nausea and vomiting after your treatment, we encourage you to take your nausea medication as directed.  BELOW ARE SYMPTOMS THAT SHOULD BE REPORTED IMMEDIATELY: *FEVER GREATER THAN 100.4 F (38 C) OR HIGHER *CHILLS OR SWEATING *NAUSEA AND VOMITING THAT IS NOT CONTROLLED WITH YOUR NAUSEA MEDICATION *UNUSUAL SHORTNESS OF BREATH *UNUSUAL BRUISING OR BLEEDING *URINARY PROBLEMS (pain or burning when urinating, or frequent urination) *BOWEL PROBLEMS (unusual diarrhea, constipation, pain near the anus) TENDERNESS IN MOUTH AND THROAT WITH OR WITHOUT PRESENCE OF ULCERS (sore throat, sores in mouth, or a toothache) UNUSUAL RASH, SWELLING OR PAIN  UNUSUAL VAGINAL DISCHARGE OR ITCHING   Items with * indicate a potential emergency and should be followed up as soon as possible or go to the Emergency Department if any problems should occur.  Please show the CHEMOTHERAPY ALERT CARD or IMMUNOTHERAPY ALERT CARD at  check-in to the Emergency Department and triage nurse.  Should you have questions after your visit or need to cancel or reschedule your appointment, please contact [facility]  Dept: [phone]  and follow the prompts.  Office hours are 8:00 a.m. to 4:30 p.m. Monday - Friday. Please note that voicemails left after 4:00 p.m. may not be returned until the following business day.  We are closed weekends and major holidays. You have access to a nurse at all times for urgent questions. Please call the main number to the clinic Dept: [phone] and follow the prompts.   For any non-urgent questions, you may also contact your provider using MyChart. We now offer e-Visits for anyone 18 and older to request care online for non-urgent symptoms. For details visit [email or web address].   Also download the MyChart app! Go to the app store, search "MyChart", open the app, select [facility], and log in with your MyChart username and password.   

## 2022-06-17 LAB — T4: T4, Total: 5.8 ug/dL (ref 4.5–12.0)

## 2022-06-25 ENCOUNTER — Encounter: Payer: Self-pay | Admitting: Hematology and Oncology

## 2022-06-27 ENCOUNTER — Other Ambulatory Visit: Payer: Self-pay

## 2022-06-27 DIAGNOSIS — C7951 Secondary malignant neoplasm of bone: Secondary | ICD-10-CM

## 2022-06-27 DIAGNOSIS — Z515 Encounter for palliative care: Secondary | ICD-10-CM

## 2022-06-27 DIAGNOSIS — G893 Neoplasm related pain (acute) (chronic): Secondary | ICD-10-CM

## 2022-06-27 MED ORDER — TRAMADOL HCL 50 MG PO TABS
50.0000 mg | ORAL_TABLET | Freq: Four times a day (QID) | ORAL | 0 refills | Status: DC | PRN
Start: 2022-06-27 — End: 2022-07-28

## 2022-06-27 NOTE — Telephone Encounter (Signed)
Pt called for tramadol refill 

## 2022-06-30 ENCOUNTER — Ambulatory Visit: Payer: Medicare HMO

## 2022-06-30 ENCOUNTER — Ambulatory Visit: Payer: Medicare HMO | Admitting: Hematology and Oncology

## 2022-06-30 ENCOUNTER — Other Ambulatory Visit: Payer: Medicare HMO

## 2022-07-04 NOTE — Progress Notes (Signed)
Palliative Medicine [facility]  [phone] [phone]   Name: Terry Page. Date: 07/04/2022 MRN: [id number]  DOB: 10/13/1955  Patient Care Team: Aliene Beams, MD as PCP - General (Family Medicine)   INTERVAL HISTORY: Terry Berkshire. is a 67 y.o. 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 He is currently undergoing chemotherapy. Palliative ask to see for symptom management.  SOCIAL HISTORY:    Terry Page reports that he quit smoking about 2 years ago. His smoking use included cigarettes. He has never used smokeless tobacco. He reports that he does not currently use alcohol after a past usage of about 12.0 standard drinks of alcohol per week. He reports that he does not use drugs.  ADVANCE DIRECTIVES:  None on file   CODE STATUS: Full Code  PAST MEDICAL HISTORY: Past Medical History:  Diagnosis Date   Alcohol abuse    Asthma    Bullous emphysema ([name])    Essential hypertension 04/10/2020   Hemorrhoids    History of radiation therapy 04/08/20-04/19/20   IMRT- Left lung- Dr. Antony Blackbird   Incidental lung nodule, greater than or equal to 8mm 10/22/2017   Left upper lobe - discovered on CTA   Prostate cancer ([name])    Spontaneous pneumothorax 10/20/2017   right   Tobacco abuse     ALLERGIES:  is allergic to morphine and oxycodone.  MEDICATIONS:  Current Outpatient Medications  Medication Sig Dispense Refill   acetaminophen (TYLENOL) 325 MG tablet Take 2 tablets (650 mg total) by mouth every 6 (six) hours as needed for mild pain or headache (fever >/= 101).     albuterol (VENTOLIN HFA) 108 (90 Base) MCG/ACT inhaler Inhale 2 puffs into the lungs every 6 (six) hours as needed for wheezing or shortness of breath. 8 g 1   amLODipine (NORVASC) 10 MG tablet Take 1 tablet by mouth  daily.     atorvastatin (LIPITOR) 20 MG tablet Take 20 mg by mouth daily.     cyanocobalamin (VITAMIN B12) 1000 MCG tablet Take 1 tablet (1,000 mcg total) by mouth daily. 90 tablet 1   ferrous sulfate 325 (65 FE) MG EC tablet Take 1 tablet (325 mg total) by mouth daily with breakfast. 30 tablet 3   folic acid (FOLVITE) 1 MG tablet Take 1 tablet (1 mg total) by mouth daily. 30 tablet 3   hydrOXYzine (ATARAX) 25 MG tablet Take 1 tablet (25 mg total) by mouth at bedtime as needed for itching. 30 tablet 11   ipratropium-albuterol (DUONEB) 0.5-2.5 (3) MG/3ML SOLN Take 3 mLs by nebulization every 6 (six) hours as needed. (Patient taking differently: Take 3 mLs by nebulization every 6 (six) hours as needed (shortness of breathing or wheezing).) 360 mL 0   lidocaine (LIDODERM) 5 % Place 1 patch onto the skin daily. Remove & Discard patch within 12 hours or as directed by MD. Apply to low back 30 patch 0   lidocaine-prilocaine (EMLA) cream Apply 1 Application topically as needed. 30 g 0   losartan (COZAAR) 100 MG tablet Take 100 mg by mouth daily.     olmesartan (BENICAR) 40 MG tablet Take 40 mg by mouth daily.     ondansetron (ZOFRAN) 8 MG tablet Take 1 tablet (8 mg total) by mouth every 8 (eight) hours as needed. 30 tablet 0   polyethylene glycol (  MIRALAX) 17 g packet Take 17 g by mouth daily. 30 each 2   potassium chloride SA (KLOR-CON M) 20 MEQ tablet TAKE 1 TABLET(20 MEQ) BY MOUTH DAILY 90 tablet 0   prochlorperazine (COMPAZINE) 10 MG tablet Take 1 tablet (10 mg total) by mouth every 6 (six) hours as needed for nausea or vomiting. 30 tablet 0   senna-docusate (STIMULANT LAXATIVE) 8.6-50 MG tablet Take 2 tablets by mouth 2 (two) times daily. May add additional tablet as needed 150 tablet 3   thiamine 100 MG tablet Take 1 tablet (100 mg total) by mouth daily.     traMADol (ULTRAM) 50 MG tablet Take 1-2 tablets (50-100 mg total) by mouth every 6 (six) hours as needed. 90 tablet 0   No current  facility-administered medications for this visit.    VITAL SIGNS: There were no vitals taken for this visit. There were no vitals filed for this visit.  Estimated body mass index is 22.12 kg/m as calculated from the following:   Height as of 11/16/21: 5\' 8"  (1.727 m).   Weight as of 06/15/22: 145 lb 8 oz (66 kg).   PERFORMANCE STATUS (ECOG) : 1 - Symptomatic but completely ambulatory  Assessment NAD RRR Normal breathing pattern AAO x4  IMPRESSION:  I saw Terry Page during his infusion. No acute distress. Denies nausea, vomiting, or diarrhea. Is remaining active. Walking daily. Is looking forward to celebrating his birthday on tomorrow.   Neoplasm related pain  Terry Page reports back and shoulder pain are well controlled on current regimen of Tramadol 50-100 mg as needed. Tolerating without difficulty. Some days are better than others.   No changes at this time. We will continue to closely monitor.   Constipation  Controlled with daily regimen.   We discussed the importance of continued conversation with family and their medical providers regarding overall plan of care and treatment options, ensuring decisions are within the context of the patients values and GOCs.   PLAN: Tramadol 50-100 mg every 6 hours as needed for pain. Senna-S daily for constipation. Miralax available if needed. Palliative will plan to see patient back in 3-4 weeks in collaboration with his other oncology appointments. Will give him a call next week for close symptom follow-up.    Patient expressed understanding and was in agreement with this plan. He also understands that He can call the clinic at any time with any questions, concerns, or complaints.    Any controlled substances utilized were prescribed in the context of palliative care. PDMP has been reviewed.    Visit consisted of counseling and education dealing with the complex and emotionally intense issues of symptom management and palliative care  in the setting of serious and potentially life-threatening illness.Greater than 50%  of this time was spent counseling and coordinating care related to the above assessment and plan.  [name], AGPCNP-BC  Palliative Medicine Team/[facility]  *Please note that this is a verbal dictation therefore any spelling or grammatical errors are due to the "Dragon Medical One" system interpretation.

## 2022-07-07 ENCOUNTER — Inpatient Hospital Stay: Payer: Medicare HMO

## 2022-07-07 ENCOUNTER — Other Ambulatory Visit: Payer: Self-pay

## 2022-07-07 ENCOUNTER — Inpatient Hospital Stay: Payer: Medicare HMO | Attending: Hematology and Oncology

## 2022-07-07 ENCOUNTER — Inpatient Hospital Stay (HOSPITAL_BASED_OUTPATIENT_CLINIC_OR_DEPARTMENT_OTHER): Payer: Medicare HMO | Admitting: Nurse Practitioner

## 2022-07-07 ENCOUNTER — Inpatient Hospital Stay: Payer: Medicare HMO | Admitting: Physician Assistant

## 2022-07-07 ENCOUNTER — Encounter: Payer: Self-pay | Admitting: Nurse Practitioner

## 2022-07-07 VITALS — BP 106/71 | HR 87 | Temp 98.5°F | Resp 18 | Ht 68.0 in | Wt 143.9 lb

## 2022-07-07 DIAGNOSIS — C3490 Malignant neoplasm of unspecified part of unspecified bronchus or lung: Secondary | ICD-10-CM | POA: Diagnosis not present

## 2022-07-07 DIAGNOSIS — K5903 Drug induced constipation: Secondary | ICD-10-CM | POA: Diagnosis not present

## 2022-07-07 DIAGNOSIS — K5900 Constipation, unspecified: Secondary | ICD-10-CM | POA: Diagnosis not present

## 2022-07-07 DIAGNOSIS — Z79620 Long term (current) use of immunosuppressive biologic: Secondary | ICD-10-CM | POA: Diagnosis not present

## 2022-07-07 DIAGNOSIS — Z79891 Long term (current) use of opiate analgesic: Secondary | ICD-10-CM | POA: Diagnosis not present

## 2022-07-07 DIAGNOSIS — Z87891 Personal history of nicotine dependence: Secondary | ICD-10-CM | POA: Insufficient documentation

## 2022-07-07 DIAGNOSIS — Z95828 Presence of other vascular implants and grafts: Secondary | ICD-10-CM

## 2022-07-07 DIAGNOSIS — C3412 Malignant neoplasm of upper lobe, left bronchus or lung: Secondary | ICD-10-CM

## 2022-07-07 DIAGNOSIS — C61 Malignant neoplasm of prostate: Secondary | ICD-10-CM | POA: Diagnosis not present

## 2022-07-07 DIAGNOSIS — Z5112 Encounter for antineoplastic immunotherapy: Secondary | ICD-10-CM | POA: Diagnosis present

## 2022-07-07 DIAGNOSIS — Z515 Encounter for palliative care: Secondary | ICD-10-CM | POA: Insufficient documentation

## 2022-07-07 DIAGNOSIS — C7951 Secondary malignant neoplasm of bone: Secondary | ICD-10-CM | POA: Insufficient documentation

## 2022-07-07 DIAGNOSIS — I10 Essential (primary) hypertension: Secondary | ICD-10-CM | POA: Diagnosis not present

## 2022-07-07 DIAGNOSIS — Z923 Personal history of irradiation: Secondary | ICD-10-CM | POA: Diagnosis not present

## 2022-07-07 DIAGNOSIS — G893 Neoplasm related pain (acute) (chronic): Secondary | ICD-10-CM | POA: Insufficient documentation

## 2022-07-07 DIAGNOSIS — D509 Iron deficiency anemia, unspecified: Secondary | ICD-10-CM | POA: Insufficient documentation

## 2022-07-07 LAB — CBC WITH DIFFERENTIAL (CANCER CENTER ONLY)
Abs Immature Granulocytes: 0.07 10*3/uL (ref 0.00–0.07)
Basophils Absolute: 0.1 10*3/uL (ref 0.0–0.1)
Basophils Relative: 1 %
Eosinophils Absolute: 0.3 10*3/uL (ref 0.0–0.5)
Eosinophils Relative: 3 %
HCT: 32.5 % — ABNORMAL LOW (ref 39.0–52.0)
Hemoglobin: 10.6 g/dL — ABNORMAL LOW (ref 13.0–17.0)
Immature Granulocytes: 1 %
Lymphocytes Relative: 16 %
Lymphs Abs: 1.8 10*3/uL (ref 0.7–4.0)
MCH: 31 pg (ref 26.0–34.0)
MCHC: 32.6 g/dL (ref 30.0–36.0)
MCV: 95 fL (ref 80.0–100.0)
Monocytes Absolute: 1.2 10*3/uL — ABNORMAL HIGH (ref 0.1–1.0)
Monocytes Relative: 11 %
Neutro Abs: 7.7 10*3/uL (ref 1.7–7.7)
Neutrophils Relative %: 68 %
Platelet Count: 308 10*3/uL (ref 150–400)
RBC: 3.42 MIL/uL — ABNORMAL LOW (ref 4.22–5.81)
RDW: 13.4 % (ref 11.5–15.5)
WBC Count: 11.1 10*3/uL — ABNORMAL HIGH (ref 4.0–10.5)
nRBC: 0 % (ref 0.0–0.2)

## 2022-07-07 LAB — CMP (CANCER CENTER ONLY)
ALT: 7 U/L (ref 0–44)
AST: 15 U/L (ref 15–41)
Albumin: 3.8 g/dL (ref 3.5–5.0)
Alkaline Phosphatase: 66 U/L (ref 38–126)
Anion gap: 8 (ref 5–15)
BUN: 20 mg/dL (ref 8–23)
CO2: 21 mmol/L — ABNORMAL LOW (ref 22–32)
Calcium: 9.6 mg/dL (ref 8.9–10.3)
Chloride: 99 mmol/L (ref 98–111)
Creatinine: 1.39 mg/dL — ABNORMAL HIGH (ref 0.61–1.24)
GFR, Estimated: 55 mL/min — ABNORMAL LOW (ref >60)
Glucose, Bld: 95 mg/dL (ref 70–99)
Potassium: 4.3 mmol/L (ref 3.5–5.1)
Sodium: 128 mmol/L — ABNORMAL LOW (ref 135–145)
Total Bilirubin: 0.4 mg/dL (ref 0.3–1.2)
Total Protein: 7.9 g/dL (ref 6.5–8.1)

## 2022-07-07 LAB — TSH: TSH: 1.61 u[IU]/mL (ref 0.350–4.500)

## 2022-07-07 MED ORDER — SODIUM CHLORIDE 0.9 % IV SOLN: Freq: Once | INTRAVENOUS | Status: AC

## 2022-07-07 MED ORDER — SODIUM CHLORIDE 0.9% FLUSH
10.0000 mL | Freq: Once | INTRAVENOUS | Status: AC
  Administered 2022-07-07: 10 mL

## 2022-07-07 MED ORDER — SENNOSIDES-DOCUSATE SODIUM 8.6-50 MG PO TABS: 2.0000 | ORAL_TABLET | Freq: Two times a day (BID) | ORAL | 3 refills | Status: DC

## 2022-07-07 MED ORDER — SODIUM CHLORIDE 0.9 % IV SOLN
200.0000 mg | Freq: Once | INTRAVENOUS | Status: AC
  Administered 2022-07-07: 200 mg via INTRAVENOUS
  Filled 2022-07-07: qty 200

## 2022-07-07 MED ORDER — HEPARIN SOD (PORK) LOCK FLUSH 100 UNIT/ML IV SOLN
500.0000 [IU] | Freq: Once | INTRAVENOUS | Status: AC | PRN
  Administered 2022-07-07: 500 [IU]

## 2022-07-07 MED ORDER — POLYETHYLENE GLYCOL 3350 17 G PO PACK: 17.0000 g | PACK | Freq: Every day | ORAL | 2 refills | Status: DC

## 2022-07-07 MED ORDER — SODIUM CHLORIDE 0.9% FLUSH
10.0000 mL | INTRAVENOUS | Status: DC | PRN
  Administered 2022-07-07: 10 mL

## 2022-07-07 NOTE — Progress Notes (Signed)
[facility] [phone]   [phone]) [phone]  PROGRESS NOTE  Patient Care Team: Aliene Beams, MD as PCP - General (Family Medicine)  Hematological/Oncological History # Metastatic Adenocarcinoma of the Lung  01/07/2020 : CT of the chest with contrast performed which showed interval enlargement of a spiculated nodule in the central left upper lobe measuring 1.2 x 1.0 cm and highly suspicious for primary lung malignancy 02/06/2020 :PET scan performed which showed a hypermetabolic irregular solid 1.3 cm left upper lobe pulmonary nodule compatible with malignancy without any other hypermetabolic lesions 04/06/4096- 04/19/2020:  SBRT to the lung lesion 04/28/2021: CT C/A/P showed multifocal lytic bone metastases are identified. Lesion within the spine of the left scapula and lesions involving bilateral iliac bones and left sacral wing. Establish care with Dr. Leonides Schanz  05/03/2021: biopsy of right iliac lytic lesion showed metastatic carcinoma, consistent with lung primary.  07/08/2021: Cycle 1 Day 1 of Carbo/Pem/Pem 07/29/2021: Cycle 2 Day 1 of Carbo/Pem/Pem 08/11/2021-08/24/2021: Received palliative radiation to osseous metastasis in the left shoulder. 30 Gy in 10 fractions.  08/19/2021: Cycle 3 Day 1 of Carbo/Pem/Pem 09/09/2021: Cycle 4 Day 1 of Carbo/Pem/Pem 09/23/2021: CT CAP: stable disease 09/30/2021: Cycle 5 Day 1 of Pem/Pem 10/21/2021: Cycle 6 Day 1 of Pembrolizumab. Held pemetrexed due to anemia. 11/16/2021: Cycle 7 Day 1 of Pem/Pem.   12/16/2021: Cycle 8 Day 1 of Pem/Pem.   01/06/2022: Cycle 9 Day 1 of Pem/Pem.  01/27/2022: Cycle 10 Day 1 of Pem/Pem 02/17/2022: Cycle 11 Day 1 of Pem/Pem 03/10/2022: Cycle 12 Day 1 of Pem/Pem (HELD due to anemia with Hgb of 6.4) 03/17/2022: Cycle 12 Day 1 of Pem/Pem (HELD pemextrexed due to cytopenias/kidney function) 04/10/2022: Cycle 13 Day 1 of Pem/Pem (HELD pemextrexed due to cytopenias/kidney function) 04/28/2022: Cycle 14 Day 1 of Pem/Pem  (HELD pemextrexed due to cytopenias/kidney function) 05/23/2022:  Cycle 15 Day 1 of Pem/Pem (HELD pemextrexed due to cytopenias/kidney function) 06/15/2022: Cycle 16 Day 1 of Pembrolizumab.  07/07/2022: Cycle 17 Day 1 of Pembrolizumab.   #Adenocarcinoma of the Prostate, T1cN0M0 08/19/2019-10/07/2019: 70 Gy in 28 fractions of 2.5 Gy.  Radiation to the prostate was under the care of Dr. Margaretmary Dys  Interval History:  Terry Page. 67 y.o. male with medical history significant for metastatic adenocarcinoma of the lung who presents for a follow up visit. The patient's last visit was on 06/15/2022. In the interim since the last visit he completed Cycle 16 with single agent pembrolizumab. He is unaccompanied for this visit.   On exam today, Terry Page reports that he is tolerating immunotherapy well. His energy is stable and he is able to complete his ADLs on his own. He denies any appetite or weight changes. He denies nausea, vomiting or abdominal pain. His bowel habits are unchanged without recurrent episodes of diarrhea or constipation. He notes that his pain is currently under good control with tramadol.  He notes that he normally takes about 3-4 times per day.  Overall he feels well and is willing and able to proceed with treatment at this time.  He denies fevers, chills, sweats, shortness of breath, chest pain or cough. He has no other complaints. A full 10 point ROS is listed below.  Overall he is willing and able to proceed with treatment at this time.  MEDICAL HISTORY:  Past Medical History:  Diagnosis Date   Alcohol abuse    Asthma    Bullous emphysema ([name])    Essential hypertension 04/10/2020   Hemorrhoids  History of radiation therapy 04/08/20-04/19/20   IMRT- Left lung- Dr. Antony Blackbird   Incidental lung nodule, greater than or equal to 8mm 10/22/2017   Left upper lobe - discovered on CTA   Prostate cancer ([name])    Spontaneous pneumothorax 10/20/2017   right   Tobacco abuse      SURGICAL HISTORY: Past Surgical History:  Procedure Laterality Date   BACK SURGERY     IR IMAGING GUIDED PORT INSERTION  06/29/2021   PROSTATE BIOPSY      SOCIAL HISTORY: Social History   Socioeconomic History   Marital status: Single    Spouse name: Not on file   Number of children: Not on file   Years of education: Not on file   Highest education level: Not on file  Occupational History   Occupation: retired  Tobacco Use   Smoking status: Former    Types: Cigarettes    Quit date: 11/22/2019    Years since quitting: 2.6   Smokeless tobacco: Never   Tobacco comments:    Patient reports quit 3 years ago. 06/25/20. [facility]  Vaping Use   Vaping Use: Never used  Substance and Sexual Activity   Alcohol use: Not Currently    Alcohol/week: 12.0 standard drinks of alcohol    Types: 12 Cans of beer per week    Comment: daily   Drug use: No   Sexual activity: Not Currently  Other Topics Concern   Not on file  Social History Narrative   Not on file   Social Determinants of Health   Financial Resource Strain: Not on file  Food Insecurity: Not on file  Transportation Needs: Unmet Transportation Needs (07/18/2021)   PRAPARE - Administrator, Civil Service (Medical): Yes    Lack of Transportation (Non-Medical): Yes  Physical Activity: Not on file  Stress: Not on file  Social Connections: Not on file  Intimate Partner Violence: Not on file    FAMILY HISTORY: Family History  Problem Relation Age of Onset   Cancer Cousin        maternal cousin   Cancer Cousin        paternal cousin   Cancer Cousin    Colon polyps Neg Hx    Pancreatic disease Neg Hx    Pancreatic cancer Neg Hx    Breast cancer Neg Hx    Colon cancer Neg Hx     ALLERGIES:  is allergic to morphine and oxycodone.  MEDICATIONS:  Current Outpatient Medications  Medication Sig Dispense Refill   acetaminophen (TYLENOL) 325 MG tablet Take 2 tablets (650 mg total) by mouth every 6 (six) hours  as needed for mild pain or headache (fever >/= 101).     amLODipine (NORVASC) 10 MG tablet Take 1 tablet by mouth daily.     atorvastatin (LIPITOR) 20 MG tablet Take 20 mg by mouth daily.     cyanocobalamin (VITAMIN B12) 1000 MCG tablet Take 1 tablet (1,000 mcg total) by mouth daily. 90 tablet 1   ferrous sulfate 325 (65 FE) MG EC tablet Take 1 tablet (325 mg total) by mouth daily with breakfast. 30 tablet 3   folic acid (FOLVITE) 1 MG tablet Take 1 tablet (1 mg total) by mouth daily. 30 tablet 3   hydrOXYzine (ATARAX) 25 MG tablet Take 1 tablet (25 mg total) by mouth at bedtime as needed for itching. 30 tablet 11   ipratropium-albuterol (DUONEB) 0.5-2.5 (3) MG/3ML SOLN Take 3 mLs by nebulization every 6 (six)  hours as needed. (Patient taking differently: Take 3 mLs by nebulization every 6 (six) hours as needed (shortness of breathing or wheezing).) 360 mL 0   lidocaine (LIDODERM) 5 % Place 1 patch onto the skin daily. Remove & Discard patch within 12 hours or as directed by MD. Apply to low back 30 patch 0   lidocaine-prilocaine (EMLA) cream Apply 1 Application topically as needed. 30 g 0   losartan (COZAAR) 100 MG tablet Take 100 mg by mouth daily.     olmesartan (BENICAR) 40 MG tablet Take 40 mg by mouth daily.     ondansetron (ZOFRAN) 8 MG tablet Take 1 tablet (8 mg total) by mouth every 8 (eight) hours as needed. 30 tablet 0   polyethylene glycol (MIRALAX) 17 g packet Take 17 g by mouth daily. 30 each 2   potassium chloride SA (KLOR-CON M) 20 MEQ tablet TAKE 1 TABLET(20 MEQ) BY MOUTH DAILY 90 tablet 0   prochlorperazine (COMPAZINE) 10 MG tablet Take 1 tablet (10 mg total) by mouth every 6 (six) hours as needed for nausea or vomiting. 30 tablet 0   senna-docusate (STIMULANT LAXATIVE) 8.6-50 MG tablet Take 2 tablets by mouth 2 (two) times daily. May add additional tablet as needed 150 tablet 3   thiamine 100 MG tablet Take 1 tablet (100 mg total) by mouth daily.     traMADol (ULTRAM) 50 MG  tablet Take 1-2 tablets (50-100 mg total) by mouth every 6 (six) hours as needed. 90 tablet 0   albuterol (VENTOLIN HFA) 108 (90 Base) MCG/ACT inhaler Inhale 2 puffs into the lungs every 6 (six) hours as needed for wheezing or shortness of breath. 8 g 1   No current facility-administered medications for this visit.    REVIEW OF SYSTEMS:   Constitutional: ( - ) fevers, ( - )  chills , ( - ) night sweats Eyes: ( - ) blurriness of vision, ( - ) double vision, ( - ) watery eyes Ears, nose, mouth, throat, and face: ( - ) mucositis, ( - ) sore throat Respiratory: ( - ) cough, ( - ) dyspnea, ( - ) wheezes Cardiovascular: ( - ) palpitation, ( - ) chest discomfort, ( - ) lower extremity swelling Gastrointestinal:  ( - ) nausea, ( - ) heartburn, ( - ) change in bowel habits Skin: ( - ) abnormal skin rashes Lymphatics: ( - ) new lymphadenopathy, ( - ) easy bruising Neurological: ( - ) numbness, ( - ) tingling, ( - ) new weaknesses Behavioral/Psych: ( - ) mood change, ( - ) new changes  All other systems were reviewed with the patient and are negative.  PHYSICAL EXAMINATION: ECOG PERFORMANCE STATUS: 1 - Symptomatic but completely ambulatory  Vitals:   07/07/22 0916  BP: 106/71  Pulse: 87  Resp: 18  Temp: 98.5 F (36.9 C)  SpO2: 100%       Filed Weights   07/07/22 0916  Weight: 143 lb 14.4 oz (65.3 kg)        GENERAL: Well-appearing elderly African-American male, alert, no distress and comfortable SKIN: skin color, texture, turgor are normal, no rashes or significant lesions EYES: conjunctiva are pink and non-injected, sclera clear LUNGS: clear to auscultation and percussion with normal breathing effort HEART: regular rate & rhythm and no murmurs and no lower extremity edema Musculoskeletal: no cyanosis of digits and no clubbing  PSYCH: alert & oriented x 3, fluent speech NEURO: no focal motor/sensory deficits  LABORATORY DATA:  I have reviewed  the data as listed    Latest  Ref Rng & Units 07/07/2022    8:58 AM 06/15/2022    9:29 AM 05/23/2022   10:58 AM  CBC  WBC 4.0 - 10.5 K/uL 11.1  10.7  9.5   Hemoglobin 13.0 - 17.0 g/dL 21.3  08.6  57.8   Hematocrit 39.0 - 52.0 % 32.5  29.7  30.8   Platelets 150 - 400 K/uL 308  318  317        Latest Ref Rng & Units 06/15/2022    9:29 AM 05/23/2022   10:58 AM 04/28/2022    8:22 AM  CMP  Glucose 70 - 99 mg/dL 469  629  98   BUN 8 - 23 mg/dL 17  15  20    Creatinine 0.61 - 1.24 mg/dL 5.28  4.13  2.44   Sodium 135 - 145 mmol/L 127  129  127   Potassium 3.5 - 5.1 mmol/L 4.4  4.5  4.7   Chloride 98 - 111 mmol/L 97  100  100   CO2 22 - 32 mmol/L 23  23  21    Calcium 8.9 - 10.3 mg/dL 9.7  9.4  9.8   Total Protein 6.5 - 8.1 g/dL 8.9  8.6  8.8   Total Bilirubin 0.3 - 1.2 mg/dL 0.4  0.4  0.4   Alkaline Phos 38 - 126 U/L 65  71  70   AST 15 - 41 U/L 14  14  16    ALT 0 - 44 U/L 9  7  8      Lab Results  Component Value Date   MPROTEIN Not Observed 04/28/2021   Lab Results  Component Value Date   KPAFRELGTCHN 33.6 (H) 04/28/2021   LAMBDASER 22.0 04/28/2021   KAPLAMBRATIO 1.53 04/28/2021    RADIOGRAPHIC STUDIES: CT CHEST ABDOMEN PELVIS W CONTRAST  Result Date: 06/15/2022 CLINICAL DATA:  Non-small-cell lung cancer. Evaluate treatment response. Alcohol abuse. Status post radiation therapy in 2023. Chemotherapy earlier this year. Remote history of prostate cancer. * Tracking Code: BO * EXAM: CT CHEST, ABDOMEN, AND PELVIS WITH CONTRAST TECHNIQUE: Multidetector CT imaging of the chest, abdomen and pelvis was performed following the standard protocol during bolus administration of intravenous contrast. RADIATION DOSE REDUCTION: This exam was performed according to the departmental dose-optimization program which includes automated exposure control, adjustment of the mA and/or kV according to patient size and/or use of iterative reconstruction technique. CONTRAST:  OMNIPAQUE IOHEXOL 300 MG/ML  SOLN COMPARISON:  03/07/2022  FINDINGS: CT CHEST FINDINGS Cardiovascular: Right Port-A-Cath tip high right atrium. Advanced aortic and branch vessel atherosclerosis. Normal heart size, without pericardial effusion. Three vessel coronary artery calcification. No central pulmonary embolism, on this non-dedicated study. Mediastinum/Nodes: No supraclavicular adenopathy. No mediastinal or hilar adenopathy. Lungs/Pleura: No pleural fluid. Advanced centrilobular and paraseptal emphysema. Right lower lobe 4 mm pulmonary nodule on 106/6, new. Left suprahilar/posterior upper lobe presumably radiation induced architectural distortion and volume loss including on 61/6, similar. Left posterolateral upper lobe area of presumably radiation induced consolidation is similar, including on 43/6. Musculoskeletal: Dominant mixed attenuation left scapular metastasis is relatively similar. Mild T12 superior endplate compression deformity is unchanged. CT ABDOMEN PELVIS FINDINGS Hepatobiliary: Mild caudate lobe enlargement. Bilateral hepatic cysts and too small to characterize lesions, similar in size and configuration to the prior. Normal gallbladder, without biliary ductal dilatation. Pancreas: Normal, without mass or ductal dilatation. Spleen: Diminutive spleen. Adrenals/Urinary Tract: Normal adrenal glands. Bilateral renal cysts. Too small to characterize renal lesions  are also most likely cysts . In the absence of clinically indicated signs/symptoms require(s) no independent follow-up. No hydronephrosis. Normal urinary bladder. Stomach/Bowel: Normal stomach, without wall thickening. Normal colon, appendix, and terminal ileum. Normal small bowel. Vascular/Lymphatic: Aortic atherosclerosis. No abdominopelvic adenopathy. Reproductive: Normal prostate. Other: No significant free fluid. No evidence of omental or peritoneal disease. Musculoskeletal: Relatively diffuse sclerotic osseous metastasis again identified, including throughout the left sacrum, bilateral iliac  bones. IMPRESSION: 1. New 4 mm right lower lobe pulmonary nodule, suspicious for metastatic disease. 2. Similar appearance of left upper lobe radiation change. 3. Similar widespread osseous metastasis. 4. No soft tissue metastasis within the abdomen or pelvis. 5. Aortic atherosclerosis (ICD10-I70.0), coronary artery atherosclerosis and emphysema (ICD10-J43.9). Electronically Signed   By: Jeronimo Greaves M.D.   On: 06/15/2022 08:43    ASSESSMENT & PLAN Terry Page. Is a 67 y.o. male with medical history significant for metastatic adenocarcinoma of the lung who presents for a follow up visit.  # Metastatic Adenocarcinoma of the Lung  -- NGS testing from this patient shows no evidence of targetable mutation. --MRI brain shows no evidence of intracranial metastases --Started carboplatin, pembrolizumab, and pemetrexed as treatment for his cancer on 07/08/2021. --Received palliative radiation to osseous metastasis of the left shoulder from 08/11/2021-08/24/2021. Planned dose is 30 Gy in 10 fx.  --Discontinued Pemetrexed on 03/17/2022 due to persistent anemia and kidney dysfunction. --Most recent CT CAP from 06/12/2022 showed overall stable disease except for one new 4 mm RLL pulmonary nodule. Plan to monitor for now.  Plan: --today is Day 1 Cycle 17 of Pembrolizumab today --labs from today were reviewed. Labs show white blood cell count 11.1, hemoglobin 10.6, MCV 95.0, and platelets of 308. Creatinine levels oscillate, today it is 1.39 which we need to monitor. LFTs normal.  --Proceed with treatment today without any dose modifications --RTC in 3 weeks with labs, follow up visit to assess for Cycle 18 of chemotherapy  # Iron Deficiency Anemia --Hgb level is improving to 10.6 today --Currently on ferrous sulfate 325 mg PO daily  #Left shoulder pain: --Secondary to left scapular metastasis.  --Most recent CT from 09/23/2021 showed stable osseous disease  #Pain Control --stopped oxycodone due to  itching --currently following with Palliative care.  --pain well controlled with tramadol as needed. Continue to take tramdol 50-100 mg q 6 hours as needed --Continue senna docusate for constipation prophylaxis --Continue to monitor  #Supportive Care -- chemotherapy education complete -- port placed -- zofran 8mg  q8H PRN and compazine 10mg  PO q6H for nausea -- EMLA cream for port -- Patient referred to palliative care for pain control. Pain control as above.    No orders of the defined types were placed in this encounter.   All questions were answered. The patient knows to call the clinic with any problems, questions or concerns.  I have spent a total of 30 minutes minutes of face-to-face and non-face-to-face time, preparing to see the patient, performing a medically appropriate examination, counseling and educating the patient, referring and communicating with other health care professionals, documenting clinical information in the electronic health record, and care coordination.   Georga Kaufmann PA-C Dept of Hematology and Oncology [facility] at [facility] Phone: [phone]  07/07/2022 9:57 AM

## 2022-07-07 NOTE — Patient Instructions (Signed)
[facility]  Discharge Instructions: Thank you for choosing [facility] to provide your oncology and hematology care.   If you have a lab appointment with the Cancer Center, please go directly to the Cancer Center and check in at the registration area.   Wear comfortable clothing and clothing appropriate for easy access to any Portacath or PICC line.   We strive to give you quality time with your provider. You may need to reschedule your appointment if you arrive late (15 or more minutes).  Arriving late affects you and other patients whose appointments are after yours.  Also, if you miss three or more appointments without notifying the office, you may be dismissed from the clinic at the provider's discretion.      For prescription refill requests, have your pharmacy contact our office and allow 72 hours for refills to be completed.    Today you received the following chemotherapy and/or immunotherapy agents: Keytruda      To help prevent nausea and vomiting after your treatment, we encourage you to take your nausea medication as directed.  BELOW ARE SYMPTOMS THAT SHOULD BE REPORTED IMMEDIATELY: *FEVER GREATER THAN 100.4 F (38 C) OR HIGHER *CHILLS OR SWEATING *NAUSEA AND VOMITING THAT IS NOT CONTROLLED WITH YOUR NAUSEA MEDICATION *UNUSUAL SHORTNESS OF BREATH *UNUSUAL BRUISING OR BLEEDING *URINARY PROBLEMS (pain or burning when urinating, or frequent urination) *BOWEL PROBLEMS (unusual diarrhea, constipation, pain near the anus) TENDERNESS IN MOUTH AND THROAT WITH OR WITHOUT PRESENCE OF ULCERS (sore throat, sores in mouth, or a toothache) UNUSUAL RASH, SWELLING OR PAIN  UNUSUAL VAGINAL DISCHARGE OR ITCHING   Items with * indicate a potential emergency and should be followed up as soon as possible or go to the Emergency Department if any problems should occur.  Please show the CHEMOTHERAPY ALERT CARD or IMMUNOTHERAPY ALERT CARD at  check-in to the Emergency Department and triage nurse.  Should you have questions after your visit or need to cancel or reschedule your appointment, please contact [facility]  Dept: [phone]  and follow the prompts.  Office hours are 8:00 a.m. to 4:30 p.m. Monday - Friday. Please note that voicemails left after 4:00 p.m. may not be returned until the following business day.  We are closed weekends and major holidays. You have access to a nurse at all times for urgent questions. Please call the main number to the clinic Dept: [phone] and follow the prompts.   For any non-urgent questions, you may also contact your provider using MyChart. We now offer e-Visits for anyone 18 and older to request care online for non-urgent symptoms. For details visit [email or web address].   Also download the MyChart app! Go to the app store, search "MyChart", open the app, select , and log in with your MyChart username and password.   

## 2022-07-08 LAB — T4: T4, Total: 6.8 ug/dL (ref 4.5–12.0)

## 2022-07-19 ENCOUNTER — Ambulatory Visit: Payer: Medicare HMO | Admitting: Podiatry

## 2022-07-19 ENCOUNTER — Encounter: Payer: Self-pay | Admitting: Podiatry

## 2022-07-19 DIAGNOSIS — M79675 Pain in left toe(s): Secondary | ICD-10-CM

## 2022-07-19 DIAGNOSIS — I739 Peripheral vascular disease, unspecified: Secondary | ICD-10-CM | POA: Diagnosis not present

## 2022-07-19 DIAGNOSIS — M79674 Pain in right toe(s): Secondary | ICD-10-CM

## 2022-07-19 DIAGNOSIS — B351 Tinea unguium: Secondary | ICD-10-CM

## 2022-07-19 NOTE — Progress Notes (Signed)
This patient returns to my office for at risk foot care.  This patient requires this care by a professional since this patient will be at risk due to having kidney disease and his is taking chemo.  This patient is unable to cut nails himself since the patient cannot reach his nails.These nails are painful walking and wearing shoes.  This patient presents for at risk foot care today.  General Appearance  Alert, conversant and in no acute stress.  Vascular  Dorsalis pedis and posterior tibial  pulses are palpable  left foot.  Absent pulses right foot.  Capillary return is within normal limits  bilaterally. Temperature is within normal limits  bilaterally.  Neurologic  Senn-Weinstein monofilament wire test within normal limits  bilaterally. Muscle power within normal limits bilaterally.  Nails Thick disfigured discolored nails with subungual debris  from hallux to fifth toes bilaterally. No evidence of bacterial infection or drainage bilaterally.  Orthopedic  No limitations of motion  feet .  No crepitus or effusions noted.  HAV  B/L.  Skin  normotropic skin with no porokeratosis noted bilaterally.  No signs of infections or ulcers noted.     Onychomycosis  Pain in right toes  Pain in left toes   Mechanical debridement of nails 1-5  bilaterally performed with a nail nipper.  Filed with dremel without incident.    Return office visit   3 months                   Told patient to return for periodic foot care and evaluation due to potential at risk complications.   Helane Gunther DPM

## 2022-07-21 ENCOUNTER — Ambulatory Visit: Payer: Medicare HMO | Admitting: Physician Assistant

## 2022-07-21 ENCOUNTER — Ambulatory Visit: Payer: Medicare HMO

## 2022-07-21 ENCOUNTER — Other Ambulatory Visit: Payer: Medicare HMO

## 2022-07-28 ENCOUNTER — Inpatient Hospital Stay: Payer: Medicare HMO

## 2022-07-28 ENCOUNTER — Inpatient Hospital Stay (HOSPITAL_BASED_OUTPATIENT_CLINIC_OR_DEPARTMENT_OTHER): Payer: Medicare HMO | Admitting: Physician Assistant

## 2022-07-28 ENCOUNTER — Other Ambulatory Visit: Payer: Self-pay

## 2022-07-28 VITALS — BP 128/77 | HR 79 | Temp 97.8°F | Resp 17 | Ht 68.0 in | Wt 147.6 lb

## 2022-07-28 DIAGNOSIS — G893 Neoplasm related pain (acute) (chronic): Secondary | ICD-10-CM | POA: Diagnosis not present

## 2022-07-28 DIAGNOSIS — E871 Hypo-osmolality and hyponatremia: Secondary | ICD-10-CM

## 2022-07-28 DIAGNOSIS — M25551 Pain in right hip: Secondary | ICD-10-CM | POA: Diagnosis not present

## 2022-07-28 DIAGNOSIS — C3490 Malignant neoplasm of unspecified part of unspecified bronchus or lung: Secondary | ICD-10-CM | POA: Diagnosis not present

## 2022-07-28 DIAGNOSIS — C3412 Malignant neoplasm of upper lobe, left bronchus or lung: Secondary | ICD-10-CM | POA: Diagnosis not present

## 2022-07-28 DIAGNOSIS — Z5112 Encounter for antineoplastic immunotherapy: Secondary | ICD-10-CM | POA: Diagnosis not present

## 2022-07-28 DIAGNOSIS — Z95828 Presence of other vascular implants and grafts: Secondary | ICD-10-CM

## 2022-07-28 DIAGNOSIS — C7951 Secondary malignant neoplasm of bone: Secondary | ICD-10-CM

## 2022-07-28 LAB — CMP (CANCER CENTER ONLY)
ALT: 9 U/L (ref 0–44)
AST: 18 U/L (ref 15–41)
Albumin: 4 g/dL (ref 3.5–5.0)
Alkaline Phosphatase: 72 U/L (ref 38–126)
Anion gap: 8 (ref 5–15)
BUN: 12 mg/dL (ref 8–23)
CO2: 20 mmol/L — ABNORMAL LOW (ref 22–32)
Calcium: 9.5 mg/dL (ref 8.9–10.3)
Chloride: 96 mmol/L — ABNORMAL LOW (ref 98–111)
Creatinine: 1.19 mg/dL (ref 0.61–1.24)
GFR, Estimated: >60 mL/min (ref >60)
Glucose, Bld: 77 mg/dL (ref 70–99)
Potassium: 4.5 mmol/L (ref 3.5–5.1)
Sodium: 124 mmol/L — ABNORMAL LOW (ref 135–145)
Total Bilirubin: 0.3 mg/dL (ref 0.3–1.2)
Total Protein: 8.1 g/dL (ref 6.5–8.1)

## 2022-07-28 LAB — CBC WITH DIFFERENTIAL (CANCER CENTER ONLY)
Abs Immature Granulocytes: 0.1 10*3/uL — ABNORMAL HIGH (ref 0.00–0.07)
Basophils Absolute: 0.1 10*3/uL (ref 0.0–0.1)
Basophils Relative: 1 %
Eosinophils Absolute: 0.3 10*3/uL (ref 0.0–0.5)
Eosinophils Relative: 3 %
HCT: 32.1 % — ABNORMAL LOW (ref 39.0–52.0)
Hemoglobin: 11 g/dL — ABNORMAL LOW (ref 13.0–17.0)
Immature Granulocytes: 1 %
Lymphocytes Relative: 16 %
Lymphs Abs: 1.6 10*3/uL (ref 0.7–4.0)
MCH: 31.4 pg (ref 26.0–34.0)
MCHC: 34.3 g/dL (ref 30.0–36.0)
MCV: 91.7 fL (ref 80.0–100.0)
Monocytes Absolute: 1 10*3/uL (ref 0.1–1.0)
Monocytes Relative: 10 %
Neutro Abs: 7 10*3/uL (ref 1.7–7.7)
Neutrophils Relative %: 69 %
Platelet Count: 293 10*3/uL (ref 150–400)
RBC: 3.5 MIL/uL — ABNORMAL LOW (ref 4.22–5.81)
RDW: 13.5 % (ref 11.5–15.5)
WBC Count: 10 10*3/uL (ref 4.0–10.5)
nRBC: 0 % (ref 0.0–0.2)

## 2022-07-28 LAB — TSH: TSH: 1.012 u[IU]/mL (ref 0.350–4.500)

## 2022-07-28 MED ORDER — SODIUM CHLORIDE 0.9% FLUSH
10.0000 mL | Freq: Once | INTRAVENOUS | Status: AC
  Administered 2022-07-28: 10 mL

## 2022-07-28 MED ORDER — SODIUM CHLORIDE 1 G PO TABS: 1.0000 g | ORAL_TABLET | Freq: Every day | ORAL | 0 refills | Status: DC

## 2022-07-28 MED ORDER — SODIUM CHLORIDE 0.9% FLUSH
10.0000 mL | INTRAVENOUS | Status: DC | PRN
  Administered 2022-07-28: 10 mL

## 2022-07-28 MED ORDER — TRAMADOL HCL 50 MG PO TABS
50.0000 mg | ORAL_TABLET | Freq: Four times a day (QID) | ORAL | 0 refills | Status: DC | PRN
Start: 2022-07-28 — End: 2022-08-16

## 2022-07-28 MED ORDER — HEPARIN SOD (PORK) LOCK FLUSH 100 UNIT/ML IV SOLN
500.0000 [IU] | Freq: Once | INTRAVENOUS | Status: AC | PRN
  Administered 2022-07-28: 500 [IU]

## 2022-07-28 MED ORDER — SODIUM CHLORIDE 0.9 % IV SOLN: Freq: Once | INTRAVENOUS | Status: AC

## 2022-07-28 MED ORDER — SODIUM CHLORIDE 0.9 % IV SOLN
200.0000 mg | Freq: Once | INTRAVENOUS | Status: AC
  Administered 2022-07-28: 200 mg via INTRAVENOUS
  Filled 2022-07-28: qty 200

## 2022-07-28 NOTE — Patient Instructions (Signed)
Terry Page  Discharge Instructions: Thank you for choosing [facility] to provide your oncology and hematology care.   If you have a lab appointment with the [facility], please go directly to the [facility] and check in at the registration area.   Wear comfortable clothing and clothing appropriate for easy access to any Portacath or PICC line.   We strive to give you quality time with your provider. You may need to reschedule your appointment if you arrive late (15 or more minutes).  Arriving late affects you and other patients whose appointments are after yours.  Also, if you miss three or more appointments without notifying the office, you may be dismissed from the clinic at the provider's discretion.      For prescription refill requests, have your pharmacy contact our office and allow 72 hours for refills to be completed.    Today you received the following chemotherapy and/or immunotherapy agents: Keytruda      To help prevent nausea and vomiting after your treatment, we encourage you to take your nausea medication as directed.  BELOW ARE SYMPTOMS THAT SHOULD BE REPORTED IMMEDIATELY: *FEVER GREATER THAN 100.4 F (38 C) OR HIGHER *CHILLS OR SWEATING *NAUSEA AND VOMITING THAT IS NOT CONTROLLED WITH YOUR NAUSEA MEDICATION *UNUSUAL SHORTNESS OF BREATH *UNUSUAL BRUISING OR BLEEDING *URINARY PROBLEMS (pain or burning when urinating, or frequent urination) *BOWEL PROBLEMS (unusual diarrhea, constipation, pain near the anus) TENDERNESS IN MOUTH AND THROAT WITH OR WITHOUT PRESENCE OF ULCERS (sore throat, sores in mouth, or a toothache) UNUSUAL RASH, SWELLING OR PAIN  UNUSUAL VAGINAL DISCHARGE OR ITCHING   Items with * indicate a potential emergency and should be followed up as soon as possible or go to the Emergency Department if any problems should occur.  Please show the CHEMOTHERAPY ALERT CARD or IMMUNOTHERAPY ALERT CARD at  check-in to the Emergency Department and triage nurse.  Should you have questions after your visit or need to cancel or reschedule your appointment, please contact [facility]  Dept: [phone]  and follow the prompts.  Office hours are 8:00 a.m. to 4:30 p.m. Monday - Friday. Please note that voicemails left after 4:00 p.m. may not be returned until the following business day.  We are closed weekends and major holidays. You have access to a nurse at all times for urgent questions. Please call the main number to the clinic Dept: [phone] and follow the prompts.   For any non-urgent questions, you may also contact your provider using MyChart. We now offer e-Visits for anyone 14 and older to request care online for non-urgent symptoms. For details visit [email or web address].   Also download the MyChart app! Go to the app store, search "MyChart", open the app, select [facility], and log in with your MyChart username and password.

## 2022-07-28 NOTE — Progress Notes (Addendum)
[facility] [phone]   [phone]  PROGRESS NOTE  Patient Care Team: Terry Beams, MD as PCP - General (Family Medicine)  Hematological/Oncological History # Metastatic Adenocarcinoma of the Lung  01/07/2020 : CT of the chest with contrast performed which showed interval enlargement of a spiculated nodule in the central left upper lobe measuring 1.2 x 1.0 cm and highly suspicious for primary lung malignancy 02/06/2020 :PET scan performed which showed a hypermetabolic irregular solid 1.3 cm left upper lobe pulmonary nodule compatible with malignancy without any other hypermetabolic lesions 04/06/4096- 04/19/2020:  SBRT to the lung lesion 04/28/2021: CT C/A/P showed multifocal lytic bone metastases are identified. Lesion within the spine of the left scapula and lesions involving bilateral iliac bones and left sacral wing. Establish care with Dr. Leonides Page  05/03/2021: biopsy of right iliac lytic lesion showed metastatic carcinoma, consistent with lung primary.  07/08/2021: Cycle 1 Day 1 of Carbo/Pem/Pem 07/29/2021: Cycle 2 Day 1 of Carbo/Pem/Pem 08/11/2021-08/24/2021: Received palliative radiation to osseous metastasis in the left shoulder. 30 Gy in 10 fractions.  08/19/2021: Cycle 3 Day 1 of Carbo/Pem/Pem 09/09/2021: Cycle 4 Day 1 of Carbo/Pem/Pem 09/23/2021: CT CAP: stable disease 09/30/2021: Cycle 5 Day 1 of Pem/Pem 10/21/2021: Cycle 6 Day 1 of Pembrolizumab. Held pemetrexed due to anemia. 11/16/2021: Cycle 7 Day 1 of Pem/Pem.   12/16/2021: Cycle 8 Day 1 of Pem/Pem.   01/06/2022: Cycle 9 Day 1 of Pem/Pem.  01/27/2022: Cycle 10 Day 1 of Pem/Pem 02/17/2022: Cycle 11 Day 1 of Pem/Pem 03/10/2022: Cycle 12 Day 1 of Pem/Pem (HELD due to anemia with Hgb of 6.4) 03/17/2022: Cycle 12 Day 1 of Pem/Pem (HELD pemextrexed due to cytopenias/kidney function) 04/10/2022: Cycle 13 Day 1 of Pem/Pem (HELD pemextrexed due to cytopenias/kidney function) 04/28/2022: Cycle 14 Day 1 of Pem/Pem  (HELD pemextrexed due to cytopenias/kidney function) 05/23/2022:  Cycle 15 Day 1 of Pem/Pem (HELD pemextrexed due to cytopenias/kidney function) 06/15/2022: Cycle 16 Day 1 of Pembrolizumab.  07/07/2022: Cycle 17 Day 1 of Pembrolizumab.  07/28/2022: Cycle 18 Day 1 of Pembrolizumab  #Adenocarcinoma of the Prostate, [id number] 08/19/2019-10/07/2019: 70 Gy in 28 fractions of 2.5 Gy.  Radiation to the prostate was under the care of Dr. Margaretmary Page  Interval History:  Terry Page. 67 y.o. male with medical history significant for metastatic adenocarcinoma of the lung who presents for a follow up visit. The patient's last visit was on 07/07/2022. In the interim since the last visit he completed Cycle 17 with single agent pembrolizumab. He is unaccompanied for this visit.   On exam today, Terry Page reports that he is tolerating immunotherapy well. His energy and appetite are stable. He denies any nausea, vomiting or bowel habit changes. He reports worsening hip pain over the last two weeks which has impacted his ambulation. He uses a cane to help with ambulation. He rates the pain as 6 out of 10 on a pain scale and adds that the pain is persistent. He takes tramadol 50 mg q 6 hours and tylenol for breakthrough pain.  He denies fevers, chills, sweats, shortness of breath, chest pain or cough. He has no other complaints. A full 10 point ROS is listed below.  Overall he is willing and able to proceed with treatment at this time.  MEDICAL HISTORY:  Past Medical History:  Diagnosis Date   Alcohol abuse    Asthma    Bullous emphysema ([name])    Essential hypertension 04/10/2020   Hemorrhoids    History  of radiation therapy 04/08/20-04/19/20   IMRT- Left lung- Dr. Antony Page   Incidental lung nodule, greater than or equal to 8mm 10/22/2017   Left upper lobe - discovered on CTA   Prostate cancer ([name])    Spontaneous pneumothorax 10/20/2017   right   Tobacco abuse     SURGICAL HISTORY: Past Surgical  History:  Procedure Laterality Date   BACK SURGERY     IR IMAGING GUIDED PORT INSERTION  06/29/2021   PROSTATE BIOPSY      SOCIAL HISTORY: Social History   Socioeconomic History   Marital status: Single    Spouse name: Not on file   Number of children: Not on file   Years of education: Not on file   Highest education level: Not on file  Occupational History   Occupation: retired  Tobacco Use   Smoking status: Former    Current packs/day: 0.00    Types: Cigarettes    Quit date: 11/22/2019    Years since quitting: 2.6   Smokeless tobacco: Never   Tobacco comments:    Patient reports quit 3 years ago. 06/25/20. [facility]  Vaping Use   Vaping status: Never Used  Substance and Sexual Activity   Alcohol use: Not Currently    Alcohol/week: 12.0 standard drinks of alcohol    Types: 12 Cans of beer per week    Comment: daily   Drug use: No   Sexual activity: Not Currently  Other Topics Concern   Not on file  Social History Narrative   Not on file   Social Determinants of Health   Financial Resource Strain: Not on file  Food Insecurity: Not on file  Transportation Needs: Unmet Transportation Needs (07/18/2021)   PRAPARE - Administrator, Civil Service (Medical): Yes    Lack of Transportation (Non-Medical): Yes  Physical Activity: Not on file  Stress: Not on file  Social Connections: Not on file  Intimate Partner Violence: Not on file    FAMILY HISTORY: Family History  Problem Relation Age of Onset   Cancer Cousin        maternal cousin   Cancer Cousin        paternal cousin   Cancer Cousin    Colon polyps Neg Hx    Pancreatic disease Neg Hx    Pancreatic cancer Neg Hx    Breast cancer Neg Hx    Colon cancer Neg Hx     ALLERGIES:  is allergic to morphine and oxycodone.  MEDICATIONS:  Current Outpatient Medications  Medication Sig Dispense Refill   acetaminophen (TYLENOL) 325 MG tablet Take 2 tablets (650 mg total) by mouth every 6 (six) hours as  needed for mild pain or headache (fever >/= 101).     amLODipine (NORVASC) 10 MG tablet Take 1 tablet by mouth daily.     atorvastatin (LIPITOR) 20 MG tablet Take 20 mg by mouth daily.     cyanocobalamin (VITAMIN B12) 1000 MCG tablet Take 1 tablet (1,000 mcg total) by mouth daily. 90 tablet 1   ferrous sulfate 325 (65 FE) MG EC tablet Take 1 tablet (325 mg total) by mouth daily with breakfast. 30 tablet 3   folic acid (FOLVITE) 1 MG tablet Take 1 tablet (1 mg total) by mouth daily. 30 tablet 3   hydrOXYzine (ATARAX) 25 MG tablet Take 1 tablet (25 mg total) by mouth at bedtime as needed for itching. 30 tablet 11   ipratropium-albuterol (DUONEB) 0.5-2.5 (3) MG/3ML SOLN Take 3 mLs  by nebulization every 6 (six) hours as needed. (Patient taking differently: Take 3 mLs by nebulization every 6 (six) hours as needed (shortness of breathing or wheezing).) 360 mL 0   lidocaine (LIDODERM) 5 % Place 1 patch onto the skin daily. Remove & Discard patch within 12 hours or as directed by MD. Apply to low back 30 patch 0   lidocaine-prilocaine (EMLA) cream Apply 1 Application topically as needed. 30 g 0   losartan (COZAAR) 100 MG tablet Take 100 mg by mouth daily.     olmesartan (BENICAR) 40 MG tablet Take 40 mg by mouth daily.     ondansetron (ZOFRAN) 8 MG tablet Take 1 tablet (8 mg total) by mouth every 8 (eight) hours as needed. 30 tablet 0   polyethylene glycol (MIRALAX) 17 g packet Take 17 g by mouth daily. 30 each 2   potassium chloride SA (KLOR-CON M) 20 MEQ tablet TAKE 1 TABLET(20 MEQ) BY MOUTH DAILY 90 tablet 0   prochlorperazine (COMPAZINE) 10 MG tablet Take 1 tablet (10 mg total) by mouth every 6 (six) hours as needed for nausea or vomiting. 30 tablet 0   senna-docusate (STIMULANT LAXATIVE) 8.6-50 MG tablet Take 2 tablets by mouth 2 (two) times daily. May add additional tablet as needed 150 tablet 3   thiamine 100 MG tablet Take 1 tablet (100 mg total) by mouth daily.     traMADol (ULTRAM) 50 MG tablet  Take 1-2 tablets (50-100 mg total) by mouth every 6 (six) hours as needed. 90 tablet 0   albuterol (VENTOLIN HFA) 108 (90 Base) MCG/ACT inhaler Inhale 2 puffs into the lungs every 6 (six) hours as needed for wheezing or shortness of breath. 8 g 1   No current facility-administered medications for this visit.    REVIEW OF SYSTEMS:   Constitutional: ( - ) fevers, ( - )  chills , ( - ) night sweats Eyes: ( - ) blurriness of vision, ( - ) double vision, ( - ) watery eyes Ears, nose, mouth, throat, and face: ( - ) mucositis, ( - ) sore throat Respiratory: ( - ) cough, ( - ) dyspnea, ( - ) wheezes Cardiovascular: ( - ) palpitation, ( - ) chest discomfort, ( - ) lower extremity swelling Gastrointestinal:  ( - ) nausea, ( - ) heartburn, ( - ) change in bowel habits Skin: ( - ) abnormal skin rashes Lymphatics: ( - ) new lymphadenopathy, ( - ) easy bruising Neurological: ( - ) numbness, ( - ) tingling, ( - ) new weaknesses Behavioral/Psych: ( - ) mood change, ( - ) new changes  All other systems were reviewed with the patient and are negative.  PHYSICAL EXAMINATION: ECOG PERFORMANCE STATUS: 1 - Symptomatic but completely ambulatory  Vitals:   07/28/22 0947  BP: 128/77  Pulse: 79  Resp: 17  Temp: 97.8 F (36.6 C)  SpO2: 100%       Filed Weights   07/28/22 0947  Weight: 147 lb 9.6 oz (67 kg)    GENERAL: Well-appearing elderly African-American male, alert, no distress and comfortable SKIN: skin color, texture, turgor are normal, no rashes or significant lesions EYES: conjunctiva are pink and non-injected, sclera clear LUNGS: clear to auscultation and percussion with normal breathing effort HEART: regular rate & rhythm and no murmurs and no lower extremity edema Musculoskeletal: no cyanosis of digits and no clubbing  PSYCH: alert & oriented x 3, fluent speech NEURO: no focal motor/sensory deficits  LABORATORY DATA:  I have  reviewed the data as listed    Latest Ref Rng & Units  07/28/2022    9:28 AM 07/07/2022    8:58 AM 06/15/2022    9:29 AM  CBC  WBC 4.0 - 10.5 K/uL 10.0  11.1  10.7   Hemoglobin 13.0 - 17.0 g/dL 41.3  24.4  01.0   Hematocrit 39.0 - 52.0 % 32.1  32.5  29.7   Platelets 150 - 400 K/uL 293  308  318        Latest Ref Rng & Units 07/07/2022    8:58 AM 06/15/2022    9:29 AM 05/23/2022   10:58 AM  CMP  Glucose 70 - 99 mg/dL 95  272  536   BUN 8 - 23 mg/dL 20  17  15    Creatinine 0.61 - 1.24 mg/dL 6.44  0.34  7.42   Sodium 135 - 145 mmol/L 128  127  129   Potassium 3.5 - 5.1 mmol/L 4.3  4.4  4.5   Chloride 98 - 111 mmol/L 99  97  100   CO2 22 - 32 mmol/L 21  23  23    Calcium 8.9 - 10.3 mg/dL 9.6  9.7  9.4   Total Protein 6.5 - 8.1 g/dL 7.9  8.9  8.6   Total Bilirubin 0.3 - 1.2 mg/dL 0.4  0.4  0.4   Alkaline Phos 38 - 126 U/L 66  65  71   AST 15 - 41 U/L 15  14  14    ALT 0 - 44 U/L 7  9  7      Lab Results  Component Value Date   MPROTEIN Not Observed 04/28/2021   Lab Results  Component Value Date   KPAFRELGTCHN 33.6 (H) 04/28/2021   LAMBDASER 22.0 04/28/2021   KAPLAMBRATIO 1.53 04/28/2021    RADIOGRAPHIC STUDIES: No results found.  ASSESSMENT & PLAN Gardell Farino. Is a 67 y.o.  male with medical history significant for metastatic adenocarcinoma of the lung who presents for a follow up visit.  # Metastatic Adenocarcinoma of the Lung  -- NGS testing from this patient shows no evidence of targetable mutation. --MRI brain shows no evidence of intracranial metastases --Started carboplatin, pembrolizumab, and pemetrexed as treatment for his cancer on 07/08/2021. --Received palliative radiation to osseous metastasis of the left shoulder from 08/11/2021-08/24/2021. Planned dose is 30 Gy in 10 fx.  --Discontinued Pemetrexed on 03/17/2022 due to persistent anemia and kidney dysfunction. --Most recent CT CAP from 06/12/2022 showed overall stable disease except for one new 4 mm RLL pulmonary nodule. Plan to monitor for now.  Plan: --today is  Day 1 Cycle 18 of Pembrolizumab today --labs from today were reviewed. Labs show white blood cell count 10.0, hemoglobin 11.0, MCV 91.7, and platelets of 293. Creatinine and LFTs normal.  --Proceed with treatment today without any dose modifications --RTC in 3 weeks with labs, follow up visit to assess for Cycle 19 of chemotherapy  #Right hip pain: --Concerning for new or progressive bone metastases --Will request restaging CT CAP for further evaluation --Continue to take tramadol as prescribed.   #Hyponatremia: --Chronic in nature --Sodium level is 124 today --Patient is asymptomatic. Recommend to start salt tablets 1 gm PO daily --Will check 8/2 to check sodium levels  # Iron Deficiency Anemia #Chemotherapy induced anemia --Hgb level is improving to 11.0 today --Currently on ferrous sulfate 325 mg PO daily  #Left shoulder pain: --Secondary to left scapular metastasis.  --Most recent CT from 09/23/2021 showed  stable osseous disease  #Pain Control --stopped oxycodone due to itching --currently following with Palliative care.  --pain well controlled with tramadol as needed. Continue to take tramdol 50-100 mg q 6 hours as needed. Sent refill today.  --Continue senna docusate for constipation prophylaxis --Continue to monitor  #Supportive Care -- chemotherapy education complete -- port placed -- zofran 8mg  q8H PRN and compazine 10mg  PO q6H for nausea -- EMLA cream for port -- Patient referred to palliative care for pain control. Pain control as above.    No orders of the defined types were placed in this encounter.   All questions were answered. The patient knows to call the clinic with any problems, questions or concerns.  I have spent a total of 30 minutes minutes of face-to-face and non-face-to-face time, preparing to see the patient, performing a medically appropriate examination, counseling and educating the patient, referring and communicating with other health care  professionals, documenting clinical information in the electronic health record, and care coordination.   Georga Kaufmann PA-C Dept of Hematology and Oncology [facility] Phone: [phone]  07/28/2022 10:20 AM

## 2022-07-28 NOTE — Addendum Note (Signed)
Addended by: Briant Cedar on: 07/28/2022 05:47 PM   Modules accepted: Orders

## 2022-07-28 NOTE — Addendum Note (Signed)
Addended by: Georga Kaufmann T on: 07/28/2022 02:24 PM   Modules accepted: Orders

## 2022-07-28 NOTE — Addendum Note (Signed)
Addended by: Georga Kaufmann T on: 07/28/2022 01:41 PM   Modules accepted: Orders

## 2022-08-03 ENCOUNTER — Other Ambulatory Visit: Payer: Self-pay | Admitting: Physician Assistant

## 2022-08-03 DIAGNOSIS — E871 Hypo-osmolality and hyponatremia: Secondary | ICD-10-CM

## 2022-08-04 ENCOUNTER — Inpatient Hospital Stay: Payer: Medicare HMO | Attending: Hematology and Oncology

## 2022-08-04 DIAGNOSIS — C3412 Malignant neoplasm of upper lobe, left bronchus or lung: Secondary | ICD-10-CM | POA: Insufficient documentation

## 2022-08-04 DIAGNOSIS — Z79891 Long term (current) use of opiate analgesic: Secondary | ICD-10-CM | POA: Insufficient documentation

## 2022-08-04 DIAGNOSIS — Z452 Encounter for adjustment and management of vascular access device: Secondary | ICD-10-CM | POA: Insufficient documentation

## 2022-08-04 DIAGNOSIS — I10 Essential (primary) hypertension: Secondary | ICD-10-CM | POA: Insufficient documentation

## 2022-08-04 DIAGNOSIS — Z5112 Encounter for antineoplastic immunotherapy: Secondary | ICD-10-CM | POA: Diagnosis present

## 2022-08-04 DIAGNOSIS — Z515 Encounter for palliative care: Secondary | ICD-10-CM | POA: Diagnosis not present

## 2022-08-04 DIAGNOSIS — T451X5D Adverse effect of antineoplastic and immunosuppressive drugs, subsequent encounter: Secondary | ICD-10-CM | POA: Diagnosis not present

## 2022-08-04 DIAGNOSIS — C7951 Secondary malignant neoplasm of bone: Secondary | ICD-10-CM | POA: Insufficient documentation

## 2022-08-04 DIAGNOSIS — C61 Malignant neoplasm of prostate: Secondary | ICD-10-CM | POA: Insufficient documentation

## 2022-08-04 DIAGNOSIS — E871 Hypo-osmolality and hyponatremia: Secondary | ICD-10-CM | POA: Diagnosis not present

## 2022-08-04 DIAGNOSIS — Z923 Personal history of irradiation: Secondary | ICD-10-CM | POA: Diagnosis not present

## 2022-08-04 DIAGNOSIS — G893 Neoplasm related pain (acute) (chronic): Secondary | ICD-10-CM | POA: Insufficient documentation

## 2022-08-04 DIAGNOSIS — Z87891 Personal history of nicotine dependence: Secondary | ICD-10-CM | POA: Diagnosis not present

## 2022-08-04 DIAGNOSIS — Z79620 Long term (current) use of immunosuppressive biologic: Secondary | ICD-10-CM | POA: Insufficient documentation

## 2022-08-04 DIAGNOSIS — M25551 Pain in right hip: Secondary | ICD-10-CM | POA: Insufficient documentation

## 2022-08-04 DIAGNOSIS — D6481 Anemia due to antineoplastic chemotherapy: Secondary | ICD-10-CM | POA: Diagnosis not present

## 2022-08-04 DIAGNOSIS — Z95828 Presence of other vascular implants and grafts: Secondary | ICD-10-CM

## 2022-08-04 LAB — COMPREHENSIVE METABOLIC PANEL
ALT: 9 U/L (ref 0–44)
AST: 16 U/L (ref 15–41)
Albumin: 4 g/dL (ref 3.5–5.0)
Alkaline Phosphatase: 74 U/L (ref 38–126)
Anion gap: 7 (ref 5–15)
BUN: 15 mg/dL (ref 8–23)
CO2: 23 mmol/L (ref 22–32)
Calcium: 9.6 mg/dL (ref 8.9–10.3)
Chloride: 96 mmol/L — ABNORMAL LOW (ref 98–111)
Creatinine, Ser: 1.15 mg/dL (ref 0.61–1.24)
GFR, Estimated: >60 mL/min (ref >60)
Glucose, Bld: 84 mg/dL (ref 70–99)
Potassium: 4.7 mmol/L (ref 3.5–5.1)
Sodium: 126 mmol/L — ABNORMAL LOW (ref 135–145)
Total Bilirubin: 0.4 mg/dL (ref 0.3–1.2)
Total Protein: 8.4 g/dL — ABNORMAL HIGH (ref 6.5–8.1)

## 2022-08-04 LAB — CBC WITH DIFFERENTIAL (CANCER CENTER ONLY)
Abs Immature Granulocytes: 0.1 10*3/uL — ABNORMAL HIGH (ref 0.00–0.07)
Basophils Absolute: 0.1 10*3/uL (ref 0.0–0.1)
Basophils Relative: 1 %
Eosinophils Absolute: 0.3 10*3/uL (ref 0.0–0.5)
Eosinophils Relative: 2 %
HCT: 31.7 % — ABNORMAL LOW (ref 39.0–52.0)
Hemoglobin: 10.9 g/dL — ABNORMAL LOW (ref 13.0–17.0)
Immature Granulocytes: 1 %
Lymphocytes Relative: 15 %
Lymphs Abs: 1.7 10*3/uL (ref 0.7–4.0)
MCH: 31.6 pg (ref 26.0–34.0)
MCHC: 34.4 g/dL (ref 30.0–36.0)
MCV: 91.9 fL (ref 80.0–100.0)
Monocytes Absolute: 1.1 10*3/uL — ABNORMAL HIGH (ref 0.1–1.0)
Monocytes Relative: 10 %
Neutro Abs: 8.4 10*3/uL — ABNORMAL HIGH (ref 1.7–7.7)
Neutrophils Relative %: 71 %
Platelet Count: 288 10*3/uL (ref 150–400)
RBC: 3.45 MIL/uL — ABNORMAL LOW (ref 4.22–5.81)
RDW: 13.4 % (ref 11.5–15.5)
WBC Count: 11.7 10*3/uL — ABNORMAL HIGH (ref 4.0–10.5)
nRBC: 0 % (ref 0.0–0.2)

## 2022-08-04 MED ORDER — SODIUM CHLORIDE 0.9% FLUSH
10.0000 mL | Freq: Once | INTRAVENOUS | Status: AC
  Administered 2022-08-04: 10 mL

## 2022-08-04 MED ORDER — HEPARIN SOD (PORK) LOCK FLUSH 100 UNIT/ML IV SOLN
500.0000 [IU] | Freq: Once | INTRAVENOUS | Status: AC
  Administered 2022-08-04: 500 [IU]

## 2022-08-07 ENCOUNTER — Other Ambulatory Visit: Payer: Self-pay | Admitting: Physician Assistant

## 2022-08-07 DIAGNOSIS — E871 Hypo-osmolality and hyponatremia: Secondary | ICD-10-CM

## 2022-08-09 ENCOUNTER — Encounter (HOSPITAL_COMMUNITY): Payer: Self-pay

## 2022-08-09 ENCOUNTER — Other Ambulatory Visit: Payer: Self-pay

## 2022-08-09 ENCOUNTER — Inpatient Hospital Stay: Payer: Medicare HMO

## 2022-08-09 ENCOUNTER — Ambulatory Visit (HOSPITAL_COMMUNITY)
Admission: RE | Admit: 2022-08-09 | Discharge: 2022-08-09 | Disposition: A | Discharge status: Home / Self Care | Payer: Medicare HMO | Source: Ambulatory Visit | Attending: Physician Assistant | Admitting: Physician Assistant

## 2022-08-09 DIAGNOSIS — E871 Hypo-osmolality and hyponatremia: Secondary | ICD-10-CM

## 2022-08-09 DIAGNOSIS — M25551 Pain in right hip: Secondary | ICD-10-CM

## 2022-08-09 DIAGNOSIS — Z95828 Presence of other vascular implants and grafts: Secondary | ICD-10-CM

## 2022-08-09 DIAGNOSIS — C7951 Secondary malignant neoplasm of bone: Secondary | ICD-10-CM | POA: Insufficient documentation

## 2022-08-09 DIAGNOSIS — C3490 Malignant neoplasm of unspecified part of unspecified bronchus or lung: Secondary | ICD-10-CM | POA: Diagnosis not present

## 2022-08-09 LAB — CBC WITH DIFFERENTIAL (CANCER CENTER ONLY)
Abs Immature Granulocytes: 0.07 10*3/uL (ref 0.00–0.07)
Basophils Absolute: 0 10*3/uL (ref 0.0–0.1)
Basophils Relative: 0 %
Eosinophils Absolute: 0.2 10*3/uL (ref 0.0–0.5)
Eosinophils Relative: 2 %
HCT: 32.6 % — ABNORMAL LOW (ref 39.0–52.0)
Hemoglobin: 11.2 g/dL — ABNORMAL LOW (ref 13.0–17.0)
Immature Granulocytes: 1 %
Lymphocytes Relative: 13 %
Lymphs Abs: 1.4 10*3/uL (ref 0.7–4.0)
MCH: 31 pg (ref 26.0–34.0)
MCHC: 34.4 g/dL (ref 30.0–36.0)
MCV: 90.3 fL (ref 80.0–100.0)
Monocytes Absolute: 1.3 10*3/uL — ABNORMAL HIGH (ref 0.1–1.0)
Monocytes Relative: 11 %
Neutro Abs: 8.3 10*3/uL — ABNORMAL HIGH (ref 1.7–7.7)
Neutrophils Relative %: 73 %
Platelet Count: 320 10*3/uL (ref 150–400)
RBC: 3.61 MIL/uL — ABNORMAL LOW (ref 4.22–5.81)
RDW: 13.3 % (ref 11.5–15.5)
WBC Count: 11.4 10*3/uL — ABNORMAL HIGH (ref 4.0–10.5)
nRBC: 0 % (ref 0.0–0.2)

## 2022-08-09 LAB — COMPREHENSIVE METABOLIC PANEL
ALT: 9 U/L (ref 0–44)
AST: 17 U/L (ref 15–41)
Albumin: 4.1 g/dL (ref 3.5–5.0)
Alkaline Phosphatase: 77 U/L (ref 38–126)
Anion gap: 9 (ref 5–15)
BUN: 12 mg/dL (ref 8–23)
CO2: 24 mmol/L (ref 22–32)
Calcium: 9.3 mg/dL (ref 8.9–10.3)
Chloride: 94 mmol/L — ABNORMAL LOW (ref 98–111)
Creatinine, Ser: 1.07 mg/dL (ref 0.61–1.24)
GFR, Estimated: >60 mL/min (ref >60)
Glucose, Bld: 93 mg/dL (ref 70–99)
Potassium: 4.1 mmol/L (ref 3.5–5.1)
Sodium: 127 mmol/L — ABNORMAL LOW (ref 135–145)
Total Bilirubin: 0.4 mg/dL (ref 0.3–1.2)
Total Protein: 8.7 g/dL — ABNORMAL HIGH (ref 6.5–8.1)

## 2022-08-09 MED ORDER — IOHEXOL 300 MG/ML  SOLN
100.0000 mL | Freq: Once | INTRAMUSCULAR | Status: AC | PRN
  Administered 2022-08-09: 100 mL via INTRAVENOUS

## 2022-08-09 MED ORDER — HEPARIN SOD (PORK) LOCK FLUSH 100 UNIT/ML IV SOLN
500.0000 [IU] | Freq: Once | INTRAVENOUS | Status: AC
  Administered 2022-08-09: 500 [IU] via INTRAVENOUS

## 2022-08-09 MED ORDER — HEPARIN SOD (PORK) LOCK FLUSH 100 UNIT/ML IV SOLN
INTRAVENOUS | Status: AC
  Filled 2022-08-09: qty 5

## 2022-08-09 MED ORDER — SODIUM CHLORIDE 0.9% FLUSH: 10.0000 mL | Freq: Once | INTRAVENOUS | Status: DC

## 2022-08-10 ENCOUNTER — Ambulatory Visit: Payer: Medicare HMO

## 2022-08-10 ENCOUNTER — Ambulatory Visit: Payer: Medicare HMO | Admitting: Hematology and Oncology

## 2022-08-10 ENCOUNTER — Other Ambulatory Visit: Payer: Medicare HMO

## 2022-08-16 ENCOUNTER — Telehealth: Payer: Self-pay | Admitting: *Deleted

## 2022-08-16 ENCOUNTER — Other Ambulatory Visit: Payer: Self-pay | Admitting: Nurse Practitioner

## 2022-08-16 DIAGNOSIS — G893 Neoplasm related pain (acute) (chronic): Secondary | ICD-10-CM

## 2022-08-16 DIAGNOSIS — C3490 Malignant neoplasm of unspecified part of unspecified bronchus or lung: Secondary | ICD-10-CM

## 2022-08-16 MED ORDER — TRAMADOL HCL 50 MG PO TABS: 50.0000 mg | ORAL_TABLET | Freq: Four times a day (QID) | ORAL | 0 refills | Status: DC | PRN

## 2022-08-16 NOTE — Telephone Encounter (Signed)
Received call from pt requesting a refill on his tramadol.  Nolon Bussing, NP made aware. It has been refilled.

## 2022-08-18 ENCOUNTER — Other Ambulatory Visit: Payer: Self-pay

## 2022-08-18 ENCOUNTER — Other Ambulatory Visit: Payer: Self-pay | Admitting: Hematology and Oncology

## 2022-08-18 ENCOUNTER — Inpatient Hospital Stay: Payer: Medicare HMO | Admitting: Physician Assistant

## 2022-08-18 ENCOUNTER — Inpatient Hospital Stay: Payer: Medicare HMO

## 2022-08-18 ENCOUNTER — Other Ambulatory Visit: Payer: Self-pay | Admitting: *Deleted

## 2022-08-18 VITALS — BP 133/87 | HR 84 | Temp 97.7°F | Resp 15 | Ht 68.0 in | Wt 144.6 lb

## 2022-08-18 DIAGNOSIS — E871 Hypo-osmolality and hyponatremia: Secondary | ICD-10-CM

## 2022-08-18 DIAGNOSIS — C3412 Malignant neoplasm of upper lobe, left bronchus or lung: Secondary | ICD-10-CM

## 2022-08-18 DIAGNOSIS — Z5112 Encounter for antineoplastic immunotherapy: Secondary | ICD-10-CM | POA: Diagnosis not present

## 2022-08-18 DIAGNOSIS — Z95828 Presence of other vascular implants and grafts: Secondary | ICD-10-CM

## 2022-08-18 DIAGNOSIS — M25551 Pain in right hip: Secondary | ICD-10-CM

## 2022-08-18 LAB — CBC WITH DIFFERENTIAL (CANCER CENTER ONLY)
Abs Immature Granulocytes: 0.1 10*3/uL — ABNORMAL HIGH (ref 0.00–0.07)
Basophils Absolute: 0 10*3/uL (ref 0.0–0.1)
Basophils Relative: 0 %
Eosinophils Absolute: 0.1 10*3/uL (ref 0.0–0.5)
Eosinophils Relative: 1 %
HCT: 35 % — ABNORMAL LOW (ref 39.0–52.0)
Hemoglobin: 12.1 g/dL — ABNORMAL LOW (ref 13.0–17.0)
Immature Granulocytes: 1 %
Lymphocytes Relative: 10 %
Lymphs Abs: 1.2 10*3/uL (ref 0.7–4.0)
MCH: 30.9 pg (ref 26.0–34.0)
MCHC: 34.6 g/dL (ref 30.0–36.0)
MCV: 89.5 fL (ref 80.0–100.0)
Monocytes Absolute: 1.1 10*3/uL — ABNORMAL HIGH (ref 0.1–1.0)
Monocytes Relative: 10 %
Neutro Abs: 9.3 10*3/uL — ABNORMAL HIGH (ref 1.7–7.7)
Neutrophils Relative %: 78 %
Platelet Count: 316 10*3/uL (ref 150–400)
RBC: 3.91 MIL/uL — ABNORMAL LOW (ref 4.22–5.81)
RDW: 13.1 % (ref 11.5–15.5)
WBC Count: 11.9 10*3/uL — ABNORMAL HIGH (ref 4.0–10.5)
nRBC: 0 % (ref 0.0–0.2)

## 2022-08-18 LAB — CMP (CANCER CENTER ONLY)
ALT: 8 U/L (ref 0–44)
ALT: 9 U/L (ref 0–44)
AST: 18 U/L (ref 15–41)
AST: 19 U/L (ref 15–41)
Albumin: 4.3 g/dL (ref 3.5–5.0)
Albumin: 4.4 g/dL (ref 3.5–5.0)
Alkaline Phosphatase: 89 U/L (ref 38–126)
Alkaline Phosphatase: 90 U/L (ref 38–126)
Anion gap: 9 (ref 5–15)
Anion gap: 9 (ref 5–15)
BUN: 14 mg/dL (ref 8–23)
BUN: 15 mg/dL (ref 8–23)
CO2: 23 mmol/L (ref 22–32)
CO2: 23 mmol/L (ref 22–32)
Calcium: 9.6 mg/dL (ref 8.9–10.3)
Calcium: 9.7 mg/dL (ref 8.9–10.3)
Chloride: 86 mmol/L — ABNORMAL LOW (ref 98–111)
Chloride: 86 mmol/L — ABNORMAL LOW (ref 98–111)
Creatinine: 1.28 mg/dL — ABNORMAL HIGH (ref 0.61–1.24)
Creatinine: 1.34 mg/dL — ABNORMAL HIGH (ref 0.61–1.24)
GFR, Estimated: >60 mL/min (ref >60)
GFR, Estimated: 57 mL/min — ABNORMAL LOW (ref >60)
Glucose, Bld: 103 mg/dL — ABNORMAL HIGH (ref 70–99)
Glucose, Bld: 108 mg/dL — ABNORMAL HIGH (ref 70–99)
Potassium: 4.9 mmol/L (ref 3.5–5.1)
Potassium: 5.1 mmol/L (ref 3.5–5.1)
Sodium: 118 mmol/L — CL (ref 135–145)
Sodium: 118 mmol/L — CL (ref 135–145)
Total Bilirubin: 0.5 mg/dL (ref 0.3–1.2)
Total Bilirubin: 0.6 mg/dL (ref 0.3–1.2)
Total Protein: 9.3 g/dL — ABNORMAL HIGH (ref 6.5–8.1)
Total Protein: 9.6 g/dL — ABNORMAL HIGH (ref 6.5–8.1)

## 2022-08-18 LAB — CORTISOL: Cortisol, Plasma: 18.3 ug/dL

## 2022-08-18 LAB — TSH: TSH: 4.361 u[IU]/mL (ref 0.350–4.500)

## 2022-08-18 MED ORDER — SODIUM CHLORIDE 1 G PO TABS: 1.0000 g | ORAL_TABLET | Freq: Three times a day (TID) | ORAL | 0 refills | Status: DC

## 2022-08-18 MED ORDER — SODIUM CHLORIDE 0.9% FLUSH
10.0000 mL | Freq: Once | INTRAVENOUS | Status: AC
  Administered 2022-08-18: 10 mL

## 2022-08-18 MED ORDER — HEPARIN SOD (PORK) LOCK FLUSH 100 UNIT/ML IV SOLN
500.0000 [IU] | Freq: Once | INTRAVENOUS | Status: AC
  Administered 2022-08-18: 500 [IU]

## 2022-08-18 NOTE — Progress Notes (Signed)
[facility] [phone]   [phone]  PROGRESS NOTE  Patient Care Team: Aliene Beams, MD as PCP - General (Family Medicine)  Hematological/Oncological History # Metastatic Adenocarcinoma of the Lung  01/07/2020 : CT of the chest with contrast performed which showed interval enlargement of a spiculated nodule in the central left upper lobe measuring 1.2 x 1.0 cm and highly suspicious for primary lung malignancy 02/06/2020 :PET scan performed which showed a hypermetabolic irregular solid 1.3 cm left upper lobe pulmonary nodule compatible with malignancy without any other hypermetabolic lesions 05/09/8467- 04/19/2020:  SBRT to the lung lesion 04/28/2021: CT C/A/P showed multifocal lytic bone metastases are identified. Lesion within the spine of the left scapula and lesions involving bilateral iliac bones and left sacral wing. Establish care with Dr. Leonides Schanz  05/03/2021: biopsy of right iliac lytic lesion showed metastatic carcinoma, consistent with lung primary.  07/08/2021: Cycle 1 Day 1 of Carbo/Pem/Pem 07/29/2021: Cycle 2 Day 1 of Carbo/Pem/Pem 08/11/2021-08/24/2021: Received palliative radiation to osseous metastasis in the left shoulder. 30 Gy in 10 fractions.  08/19/2021: Cycle 3 Day 1 of Carbo/Pem/Pem 09/09/2021: Cycle 4 Day 1 of Carbo/Pem/Pem 09/23/2021: CT CAP: stable disease 09/30/2021: Cycle 5 Day 1 of Pem/Pem 10/21/2021: Cycle 6 Day 1 of Pembrolizumab. Held pemetrexed due to anemia. 11/16/2021: Cycle 7 Day 1 of Pem/Pem.   12/16/2021: Cycle 8 Day 1 of Pem/Pem.   01/06/2022: Cycle 9 Day 1 of Pem/Pem.  01/27/2022: Cycle 10 Day 1 of Pem/Pem 02/17/2022: Cycle 11 Day 1 of Pem/Pem 03/10/2022: Cycle 12 Day 1 of Pem/Pem (HELD due to anemia with Hgb of 6.4) 03/17/2022: Cycle 12 Day 1 of Pem/Pem (HELD pemextrexed due to cytopenias/kidney function) 04/10/2022: Cycle 13 Day 1 of Pem/Pem (HELD pemextrexed due to cytopenias/kidney function) 04/28/2022: Cycle 14 Day 1 of Pem/Pem  (HELD pemextrexed due to cytopenias/kidney function) 05/23/2022:  Cycle 15 Day 1 of Pem/Pem (HELD pemextrexed due to cytopenias/kidney function) 06/15/2022: Cycle 16 Day 1 of Pembrolizumab.  07/07/2022: Cycle 17 Day 1 of Pembrolizumab.  07/28/2022: Cycle 18 Day 1 of Pembrolizumab 08/18/2022: Cycle 19 Day 1 of Pembrolizumab HELD due to hyponatremia of 118, new bone metastases.   #Adenocarcinoma of the Prostate, T1cN0M0 08/19/2019-10/07/2019: 70 Gy in 28 fractions of 2.5 Gy.  Radiation to the prostate was under the care of Dr. Margaretmary Dys  Interval History:  Terry Page. 67 y.o. male with medical history significant for metastatic adenocarcinoma of the lung who presents for a follow up visit. The patient's last visit was on 07/28/2022. In the interim since the last visit he completed Cycle 18 with single agent pembrolizumab. He is unaccompanied for this visit.   On exam today, Terry Page reports that he is tolerating immunotherapy well. His energy and appetite are stable. He continues to have right hip pain that affects his ambulation. He uses a cane to help with ambulation. He is taking tramadol q 6 hours and tylenol for breakthrough pain. He denies nausea, vomiting or bowel habit changes. He denies easy bruising or signs of active bleeding. He denies fevers, chills, sweats, shortness of breath, chest pain, cough, headaches or dizziness. He has no other complaints. A full 10 point ROS is listed below.  MEDICAL HISTORY:  Past Medical History:  Diagnosis Date   Alcohol abuse    Asthma    Bullous emphysema ([name])    Essential hypertension 04/10/2020   Hemorrhoids    History of radiation therapy 04/08/20-04/19/20   IMRT- Left lung- Dr. Antony Blackbird  Incidental lung nodule, greater than or equal to 8mm 10/22/2017   Left upper lobe - discovered on CTA   Prostate cancer ([name])    Spontaneous pneumothorax 10/20/2017   right   Tobacco abuse     SURGICAL HISTORY: Past Surgical History:   Procedure Laterality Date   BACK SURGERY     IR IMAGING GUIDED PORT INSERTION  06/29/2021   PROSTATE BIOPSY      SOCIAL HISTORY: Social History   Socioeconomic History   Marital status: Single    Spouse name: Not on file   Number of children: Not on file   Years of education: Not on file   Highest education level: Not on file  Occupational History   Occupation: retired  Tobacco Use   Smoking status: Former    Current packs/day: 0.00    Types: Cigarettes    Quit date: 11/22/2019    Years since quitting: 2.7   Smokeless tobacco: Never   Tobacco comments:    Patient reports quit 3 years ago. 06/25/20. [facility]  Vaping Use   Vaping status: Never Used  Substance and Sexual Activity   Alcohol use: Not Currently    Alcohol/week: 12.0 standard drinks of alcohol    Types: 12 Cans of beer per week    Comment: daily   Drug use: No   Sexual activity: Not Currently  Other Topics Concern   Not on file  Social History Narrative   Not on file   Social Determinants of Health   Financial Resource Strain: Not on file  Food Insecurity: Not on file  Transportation Needs: Unmet Transportation Needs (07/18/2021)   PRAPARE - Administrator, Civil Service (Medical): Yes    Lack of Transportation (Non-Medical): Yes  Physical Activity: Not on file  Stress: Not on file  Social Connections: Not on file  Intimate Partner Violence: Not on file    FAMILY HISTORY: Family History  Problem Relation Age of Onset   Cancer Cousin        maternal cousin   Cancer Cousin        paternal cousin   Cancer Cousin    Colon polyps Neg Hx    Pancreatic disease Neg Hx    Pancreatic cancer Neg Hx    Breast cancer Neg Hx    Colon cancer Neg Hx     ALLERGIES:  is allergic to morphine and oxycodone.  MEDICATIONS:  Current Outpatient Medications  Medication Sig Dispense Refill   acetaminophen (TYLENOL) 325 MG tablet Take 2 tablets (650 mg total) by mouth every 6 (six) hours as needed for  mild pain or headache (fever >/= 101).     amLODipine (NORVASC) 10 MG tablet Take 1 tablet by mouth daily.     atorvastatin (LIPITOR) 20 MG tablet Take 20 mg by mouth daily.     cyanocobalamin (VITAMIN B12) 1000 MCG tablet Take 1 tablet (1,000 mcg total) by mouth daily. 90 tablet 1   ferrous sulfate 325 (65 FE) MG EC tablet Take 1 tablet (325 mg total) by mouth daily with breakfast. 30 tablet 3   folic acid (FOLVITE) 1 MG tablet Take 1 tablet (1 mg total) by mouth daily. 30 tablet 3   hydrOXYzine (ATARAX) 25 MG tablet Take 1 tablet (25 mg total) by mouth at bedtime as needed for itching. 30 tablet 11   ipratropium-albuterol (DUONEB) 0.5-2.5 (3) MG/3ML SOLN Take 3 mLs by nebulization every 6 (six) hours as needed. (Patient taking differently: Take 3 mLs  by nebulization every 6 (six) hours as needed (shortness of breathing or wheezing).) 360 mL 0   lidocaine (LIDODERM) 5 % Place 1 patch onto the skin daily. Remove & Discard patch within 12 hours or as directed by MD. Apply to low back 30 patch 0   lidocaine-prilocaine (EMLA) cream Apply 1 Application topically as needed. 30 g 0   losartan (COZAAR) 100 MG tablet Take 100 mg by mouth daily.     olmesartan (BENICAR) 40 MG tablet Take 40 mg by mouth daily.     ondansetron (ZOFRAN) 8 MG tablet Take 1 tablet (8 mg total) by mouth every 8 (eight) hours as needed. 30 tablet 0   polyethylene glycol (MIRALAX) 17 g packet Take 17 g by mouth daily. 30 each 2   potassium chloride SA (KLOR-CON M) 20 MEQ tablet TAKE 1 TABLET(20 MEQ) BY MOUTH DAILY 90 tablet 0   prochlorperazine (COMPAZINE) 10 MG tablet Take 1 tablet (10 mg total) by mouth every 6 (six) hours as needed for nausea or vomiting. 30 tablet 0   senna-docusate (STIMULANT LAXATIVE) 8.6-50 MG tablet Take 2 tablets by mouth 2 (two) times daily. May add additional tablet as needed 150 tablet 3   thiamine 100 MG tablet Take 1 tablet (100 mg total) by mouth daily.     traMADol (ULTRAM) 50 MG tablet Take 1-2  tablets (50-100 mg total) by mouth every 6 (six) hours as needed. 90 tablet 0   albuterol (VENTOLIN HFA) 108 (90 Base) MCG/ACT inhaler Inhale 2 puffs into the lungs every 6 (six) hours as needed for wheezing or shortness of breath. 8 g 1   sodium chloride 1 g tablet Take 1 tablet (1 g total) by mouth 3 (three) times daily with meals. 90 tablet 0   No current facility-administered medications for this visit.    REVIEW OF SYSTEMS:   Constitutional: ( - ) fevers, ( - )  chills , ( - ) night sweats Eyes: ( - ) blurriness of vision, ( - ) double vision, ( - ) watery eyes Ears, nose, mouth, throat, and face: ( - ) mucositis, ( - ) sore throat Respiratory: ( - ) cough, ( - ) dyspnea, ( - ) wheezes Cardiovascular: ( - ) palpitation, ( - ) chest discomfort, ( - ) lower extremity swelling Gastrointestinal:  ( - ) nausea, ( - ) heartburn, ( - ) change in bowel habits Skin: ( - ) abnormal skin rashes Lymphatics: ( - ) new lymphadenopathy, ( - ) easy bruising Neurological: ( - ) numbness, ( - ) tingling, ( - ) new weaknesses Behavioral/Psych: ( - ) mood change, ( - ) new changes  All other systems were reviewed with the patient and are negative.  PHYSICAL EXAMINATION: ECOG PERFORMANCE STATUS: 1 - Symptomatic but completely ambulatory  Vitals:   08/18/22 1117  BP: 133/87  Pulse: 84  Resp: 15  Temp: 97.7 F (36.5 C)  SpO2: 100%    Filed Weights   08/18/22 1117  Weight: 144 lb 9.6 oz (65.6 kg)     GENERAL: Well-appearing elderly African-American male, alert, no distress and comfortable SKIN: skin color, texture, turgor are normal, no rashes or significant lesions EYES: conjunctiva are pink and non-injected, sclera clear LUNGS: clear to auscultation and percussion with normal breathing effort HEART: regular rate & rhythm and no murmurs and no lower extremity edema Musculoskeletal: no cyanosis of digits and no clubbing  PSYCH: alert & oriented x 3, fluent speech NEURO: no focal  motor/sensory deficits  LABORATORY DATA:  I have reviewed the data as listed    Latest Ref Rng & Units 08/18/2022   10:42 AM 08/09/2022    9:06 AM 08/04/2022   10:33 AM  CBC  WBC 4.0 - 10.5 K/uL 11.9  11.4  11.7   Hemoglobin 13.0 - 17.0 g/dL 91.4  78.2  95.6   Hematocrit 39.0 - 52.0 % 35.0  32.6  31.7   Platelets 150 - 400 K/uL 316  320  288        Latest Ref Rng & Units 08/18/2022   10:42 AM 08/09/2022    9:06 AM 08/04/2022   10:31 AM  CMP  Glucose 70 - 99 mg/dL 213  93  84   BUN 8 - 23 mg/dL 14  12  15    Creatinine 0.61 - 1.24 mg/dL 0.86  5.78  4.69   Sodium 135 - 145 mmol/L 118  127  126   Potassium 3.5 - 5.1 mmol/L 4.9  4.1  4.7   Chloride 98 - 111 mmol/L 86  94  96   CO2 22 - 32 mmol/L 23  24  23    Calcium 8.9 - 10.3 mg/dL 9.6  9.3  9.6   Total Protein 6.5 - 8.1 g/dL 9.3  8.7  8.4   Total Bilirubin 0.3 - 1.2 mg/dL 0.5  0.4  0.4   Alkaline Phos 38 - 126 U/L 89  77  74   AST 15 - 41 U/L 18  17  16    ALT 0 - 44 U/L 8  9  9      Lab Results  Component Value Date   MPROTEIN Not Observed 04/28/2021   Lab Results  Component Value Date   KPAFRELGTCHN 33.6 (H) 04/28/2021   LAMBDASER 22.0 04/28/2021   KAPLAMBRATIO 1.53 04/28/2021    RADIOGRAPHIC STUDIES: CT CHEST ABDOMEN PELVIS W CONTRAST  Result Date: 08/11/2022 CLINICAL DATA:  Metastatic lung cancer, worsening right hip pain * Tracking Code: BO * EXAM: CT CHEST, ABDOMEN, AND PELVIS WITH CONTRAST TECHNIQUE: Multidetector CT imaging of the chest, abdomen and pelvis was performed following the standard protocol during bolus administration of intravenous contrast. RADIATION DOSE REDUCTION: This exam was performed according to the departmental dose-optimization program which includes automated exposure control, adjustment of the mA and/or kV according to patient size and/or use of iterative reconstruction technique. CONTRAST:  OMNIPAQUE IOHEXOL 300 MG/ML  SOLN COMPARISON:  06/12/2022 FINDINGS: CT CHEST FINDINGS Cardiovascular:  Right chest port catheter. Aortic atherosclerosis. Normal heart size. Three-vessel coronary artery calcifications no pericardial effusion. Mediastinum/Nodes: No enlarged mediastinal, hilar, or axillary lymph nodes. Thyroid gland, trachea, and esophagus demonstrate no significant findings. Lungs/Pleura: Unchanged post treatment/post radiation appearance of the chest, with bandlike, treated mass of the central left upper lobe and additional bandlike scarring and fibrosis of the perihilar left lung (series 6, image 43, 59). Severe, bullous emphysema of the left apex. No pleural effusion or pneumothorax. Musculoskeletal: No chest wall abnormality. No acute osseous findings. Unchanged sclerotic metastasis involving the left scapular body and acromion. CT ABDOMEN PELVIS FINDINGS Hepatobiliary: No solid liver abnormality is seen. Unchanged hepatic cysts and additional low-attenuation lesions too small to confidently characterize although almost certainly additional cysts, all unchanged. No gallstones, gallbladder wall thickening, or biliary dilatation. Pancreas: Unremarkable. No pancreatic ductal dilatation or surrounding inflammatory changes. Spleen: Involuted spleen (series 2, image 66). Adrenals/Urinary Tract: Adrenal glands are unremarkable. Simple, benign bilateral renal cortical and parapelvic cysts, for which no further follow-up  or characterization is required kidneys are otherwise normal, without renal calculi, solid lesion, or hydronephrosis. Bladder is unremarkable. Stomach/Bowel: Stomach is within normal limits. Appendix appears normal. No evidence of bowel wall thickening, distention, or inflammatory changes. Occasional sigmoid diverticula. Vascular/Lymphatic: Severe aortic atherosclerosis. No enlarged abdominal or pelvic lymph nodes. Reproductive: No mass or other abnormality. Other: No abdominal wall hernia or abnormality. No ascites. Musculoskeletal: No acute osseous findings. Interval enlargement of  multiple mixed lytic and sclerotic osseous metastases involving the right pelvis, particularly including a large metastasis of the right iliac wing with an associated soft tissue component measuring 4.0 x 3.4 cm, previously 2.6 x 1.8 cm (series 2, image 96) as well as additional smaller lesions of the posterior right iliac body adjacent to the sacroiliac joint (series 2, image 93) and posterior right acetabulum (series 2, image 107). Other heterogeneously sclerotic metastases of the sacrum and left hemipelvis are not significantly changed. IMPRESSION: 1. Interval enlargement of multiple mixed lytic and sclerotic osseous metastases involving the right pelvis, particularly including a large metastasis of the right iliac wing with an associated soft tissue component measuring 4.0 x 3.4 cm, previously 2.6 x 1.8 cm, as well as additional smaller lesions of the posterior right iliac body adjacent to the sacroiliac joint and posterior right acetabulum. 2. Other heterogeneously sclerotic osseous metastases including of the left scapula, sacrum, and left hemipelvis are unchanged 3. Unchanged post treatment/post radiation appearance of the chest, with bandlike, treated mass of the central left upper lobe and additional bandlike scarring and fibrosis of the perihilar left lung. 4. No evidence of lymphadenopathy or soft tissue metastases in the chest, abdomen, or pelvis. 5. Severe emphysema. 6. Coronary artery disease. Aortic Atherosclerosis (ICD10-I70.0) and Emphysema (ICD10-J43.9). Electronically Signed   By: Jearld Lesch M.D.   On: 08/11/2022 16:13    ASSESSMENT & PLAN Terry Page. Is a 67 y.o.  male with medical history significant for metastatic adenocarcinoma of the lung who presents for a follow up visit.  # Metastatic Adenocarcinoma of the Lung  -- NGS testing from this patient shows no evidence of targetable mutation. --MRI brain shows no evidence of intracranial metastases --Started carboplatin,  pembrolizumab, and pemetrexed as treatment for his cancer on 07/08/2021. --Received palliative radiation to osseous metastasis of the left shoulder from 08/11/2021-08/24/2021. Planned dose is 30 Gy in 10 fx.  --Discontinued Pemetrexed on 03/17/2022 due to persistent anemia and kidney dysfunction. Plan: --today is Day 1 Cycle 19 of Pembrolizumab today --labs from today were reviewed. Labs show white blood cell count 11.9, hemoglobin 12.1, MCV 89.5, and platelets of 316.  Creatinine 1.28, LFTs normal. --Restaging CT CAP from 08/09/2022 shows interval enlargement of multiple mixed lytic and sclerotic bone metastases involving the right pelvis. Other bone metastases including of the left scapula, sacrum, and left hemipelvis are unchanged. Unchanged treated central LUL mass. --Recommend to hold treatment due to acute decreased in serum sodium levels and above CT findings.  --RTC in 1 week with labs, follow up visit to re-assess for Cycle 19 of chemotherapy  #Right hip pain: --Secondary progressive bone metastases in right pelvis seen on CT imaging from 08/09/2022 --Requested follow up with radiation oncology to evaluation for palliative radiation --Continue to take tramadol q 6 hours for pain control.   #Hyponatremia: --Chronic in nature --Sodium level dropped from 127 on 08/09/2022 to 118.  --Etiologies including SIADH, immunotherapy induced, too much water intake.  --Patient is asymptomatic without nausea/vomiting, headaches, confusion, seizures, etc --Currently on salt  tablets 1 gm PO daily. After discussion with Dr. Candise Che, we advised to titrate to twice daily x 7 days and then three times daily.  --Will repeat labs on 8/19 to check sodium levels and urine sodium levels. Patient unable to provide urine sample today and refused to stay.  --Strict ED precautions given for any new symptoms as discussed above.   # Iron Deficiency Anemia #Chemotherapy induced anemia --Hgb level is improving to 12.1  today --Currently on ferrous sulfate 325 mg PO daily  #Pain Control --stopped oxycodone due to itching --currently following with Palliative care.  --pain well controlled with tramadol as needed. Continue to take tramdol 50-100 mg q 6 hours as needed. --Continue senna docusate for constipation prophylaxis --Continue to monitor  #Supportive Care -- chemotherapy education complete -- port placed -- zofran 8mg  q8H PRN and compazine 10mg  PO q6H for nausea -- EMLA cream for port -- Patient referred to palliative care for pain control. Pain control as above.    Orders Placed This Encounter  Procedures   Cortisol   Urine, Sodium    Standing Status:   Future    Number of Occurrences:   1    Standing Expiration Date:   08/18/2023    All questions were answered. The patient knows to call the clinic with any problems, questions or concerns.  I have spent a total of 30 minutes minutes of face-to-face and non-face-to-face time, preparing to see the patient, performing a medically appropriate examination, counseling and educating the patient, referring and communicating with other health care professionals, documenting clinical information in the electronic health record, and care coordination.   Georga Kaufmann PA-C Dept of Hematology and Oncology [facility] Phone: [phone]  08/18/2022 12:58 PM

## 2022-08-18 NOTE — Addendum Note (Signed)
Addended byNoralee Stain L on: 08/18/2022 12:48 PM   Modules accepted: Orders

## 2022-08-19 LAB — T4: T4, Total: 6.6 ug/dL (ref 4.5–12.0)

## 2022-08-21 ENCOUNTER — Other Ambulatory Visit: Payer: Medicare HMO

## 2022-08-21 ENCOUNTER — Ambulatory Visit: Payer: Medicare HMO | Admitting: Radiation Oncology

## 2022-08-21 ENCOUNTER — Inpatient Hospital Stay: Payer: Medicare HMO

## 2022-08-21 ENCOUNTER — Ambulatory Visit
Admission: RE | Admit: 2022-08-21 | Discharge: 2022-08-21 | Disposition: A | Discharge status: Home / Self Care | Payer: Medicare HMO | Source: Ambulatory Visit | Attending: Radiation Oncology | Admitting: Radiation Oncology

## 2022-08-21 ENCOUNTER — Encounter: Payer: Self-pay | Admitting: Radiation Oncology

## 2022-08-21 VITALS — BP 120/78 | HR 77 | Temp 98.0°F | Resp 20 | Ht 69.0 in

## 2022-08-21 DIAGNOSIS — I2510 Atherosclerotic heart disease of native coronary artery without angina pectoris: Secondary | ICD-10-CM | POA: Insufficient documentation

## 2022-08-21 DIAGNOSIS — I10 Essential (primary) hypertension: Secondary | ICD-10-CM | POA: Insufficient documentation

## 2022-08-21 DIAGNOSIS — Z923 Personal history of irradiation: Secondary | ICD-10-CM | POA: Diagnosis not present

## 2022-08-21 DIAGNOSIS — C3412 Malignant neoplasm of upper lobe, left bronchus or lung: Secondary | ICD-10-CM | POA: Diagnosis not present

## 2022-08-21 DIAGNOSIS — J432 Centrilobular emphysema: Secondary | ICD-10-CM | POA: Insufficient documentation

## 2022-08-21 DIAGNOSIS — C7951 Secondary malignant neoplasm of bone: Secondary | ICD-10-CM | POA: Diagnosis present

## 2022-08-21 DIAGNOSIS — Z9221 Personal history of antineoplastic chemotherapy: Secondary | ICD-10-CM | POA: Insufficient documentation

## 2022-08-21 DIAGNOSIS — F1011 Alcohol abuse, in remission: Secondary | ICD-10-CM | POA: Diagnosis not present

## 2022-08-21 DIAGNOSIS — Z5112 Encounter for antineoplastic immunotherapy: Secondary | ICD-10-CM | POA: Diagnosis not present

## 2022-08-21 DIAGNOSIS — I700 Atherosclerosis of aorta: Secondary | ICD-10-CM | POA: Insufficient documentation

## 2022-08-21 DIAGNOSIS — Z79899 Other long term (current) drug therapy: Secondary | ICD-10-CM | POA: Diagnosis not present

## 2022-08-21 DIAGNOSIS — Z87891 Personal history of nicotine dependence: Secondary | ICD-10-CM | POA: Insufficient documentation

## 2022-08-21 DIAGNOSIS — Z95828 Presence of other vascular implants and grafts: Secondary | ICD-10-CM

## 2022-08-21 LAB — CBC WITH DIFFERENTIAL (CANCER CENTER ONLY)
Abs Immature Granulocytes: 0.13 10*3/uL — ABNORMAL HIGH (ref 0.00–0.07)
Basophils Absolute: 0 10*3/uL (ref 0.0–0.1)
Basophils Relative: 0 %
Eosinophils Absolute: 0.2 10*3/uL (ref 0.0–0.5)
Eosinophils Relative: 2 %
HCT: 33.9 % — ABNORMAL LOW (ref 39.0–52.0)
Hemoglobin: 11.4 g/dL — ABNORMAL LOW (ref 13.0–17.0)
Immature Granulocytes: 1 %
Lymphocytes Relative: 14 %
Lymphs Abs: 1.6 10*3/uL (ref 0.7–4.0)
MCH: 30.5 pg (ref 26.0–34.0)
MCHC: 33.6 g/dL (ref 30.0–36.0)
MCV: 90.6 fL (ref 80.0–100.0)
Monocytes Absolute: 0.8 10*3/uL (ref 0.1–1.0)
Monocytes Relative: 7 %
Neutro Abs: 8.6 10*3/uL — ABNORMAL HIGH (ref 1.7–7.7)
Neutrophils Relative %: 76 %
Platelet Count: 289 10*3/uL (ref 150–400)
RBC: 3.74 MIL/uL — ABNORMAL LOW (ref 4.22–5.81)
RDW: 13.2 % (ref 11.5–15.5)
WBC Count: 11.4 10*3/uL — ABNORMAL HIGH (ref 4.0–10.5)
nRBC: 0 % (ref 0.0–0.2)

## 2022-08-21 LAB — CMP (CANCER CENTER ONLY)
ALT: 11 U/L (ref 0–44)
AST: 21 U/L (ref 15–41)
Albumin: 4.4 g/dL (ref 3.5–5.0)
Alkaline Phosphatase: 86 U/L (ref 38–126)
Anion gap: 10 (ref 5–15)
BUN: 14 mg/dL (ref 8–23)
CO2: 22 mmol/L (ref 22–32)
Calcium: 9.6 mg/dL (ref 8.9–10.3)
Chloride: 88 mmol/L — ABNORMAL LOW (ref 98–111)
Creatinine: 1.09 mg/dL (ref 0.61–1.24)
GFR, Estimated: >60 mL/min (ref >60)
Glucose, Bld: 142 mg/dL — ABNORMAL HIGH (ref 70–99)
Potassium: 4.3 mmol/L (ref 3.5–5.1)
Sodium: 120 mmol/L — ABNORMAL LOW (ref 135–145)
Total Bilirubin: 0.4 mg/dL (ref 0.3–1.2)
Total Protein: 9.3 g/dL — ABNORMAL HIGH (ref 6.5–8.1)

## 2022-08-21 MED ORDER — SODIUM CHLORIDE 0.9% FLUSH
10.0000 mL | Freq: Once | INTRAVENOUS | Status: AC
  Administered 2022-08-21: 10 mL

## 2022-08-21 MED ORDER — HEPARIN SOD (PORK) LOCK FLUSH 100 UNIT/ML IV SOLN
500.0000 [IU] | Freq: Once | INTRAVENOUS | Status: AC
  Administered 2022-08-21: 500 [IU]

## 2022-08-21 NOTE — Progress Notes (Signed)
[facility]         [phone] ________________________________  Initial outpatient Consultation  Name: Terry Page. MRN: [id number]  Date: 08/21/2022  DOB: 02/05/1955  REFERRING PHYSICIAN: Jaci Standard, MD  DIAGNOSIS: 67 yo gentleman with metastatic lung cancer with painful osseous metastasis in the right pelvis.   The encounter diagnosis was Bone neoplasm secondary ([name]).    ICD-10-CM   1. Bone neoplasm secondary ([name])  C79.51       HISTORY OF PRESENT ILLNESS::Terry R Mitchell Churchwell. is a 67 y.o. gentleman with stage IV adenocarcinoma. He is well-known to our service, having undergone multiple courses of radiation previously, most recently a palliative course of treatment to a painful left shoulder metastasis in August of 2023.   Most recently, he completed his 19th cycle of Pembrolizumab on 08/18/22 under the care of Dr. Leonides Schanz. At that time he had also been complaining of right hip pain. CT of the CAP on 08/09/22 showed interval enlargement of multiple mixed lytic and sclerotic osseous metastases involving the right pelvis, particularly including a large metastasis of the right iliac wing with an associated soft tissue component measuring 4.0 cm previously 2.6 cm. Other bone metastases including of the left scapula, sacrum, and left hemipelvis are unchanged. The treated central LUL mass was unchanged in size.   He was kindly referred to [place] today to discuss potential radiation treatment options.   PREVIOUS RADIATION THERAPY: Yes   08/11/21 - 08/24/21:   The painful site of metastasis in the left shoulder was treated to 30 Gy in 10 fractions of 3 Gy each.   04/08/20 - 04/19/20: Left lung (SBRT x3)- [facility]   08/19/19 - 10/07/19:   The prostate was treated to 70 Gy in 28 fractions of 2.5 Gy Kathrynn Running)   04/03/18 - 04/12/18: LUL / 54 Gy in 3 fractions (SBRT x3)- [facility]  Past Medical History:  Diagnosis Date   Alcohol abuse    Asthma    Bullous emphysema ([name])     Essential hypertension 04/10/2020   Hemorrhoids    History of radiation therapy 04/08/20-04/19/20   IMRT- Left lung- Dr. Antony Blackbird   Incidental lung nodule, greater than or equal to 8mm 10/22/2017   Left upper lobe - discovered on CTA   Prostate cancer ([name])    Spontaneous pneumothorax 10/20/2017   right   Tobacco abuse   :   Past Surgical History:  Procedure Laterality Date   BACK SURGERY     IR IMAGING GUIDED PORT INSERTION  06/29/2021   PROSTATE BIOPSY    :   Current Outpatient Medications:    acetaminophen (TYLENOL) 325 MG tablet, Take 2 tablets (650 mg total) by mouth every 6 (six) hours as needed for mild pain or headache (fever >/= 101)., Disp: , Rfl:    albuterol (VENTOLIN HFA) 108 (90 Base) MCG/ACT inhaler, Inhale 2 puffs into the lungs every 6 (six) hours as needed for wheezing or shortness of breath., Disp: 8 g, Rfl: 1   amLODipine (NORVASC) 10 MG tablet, Take 1 tablet by mouth daily., Disp: , Rfl:    atorvastatin (LIPITOR) 20 MG tablet, Take 20 mg by mouth daily., Disp: , Rfl:    cyanocobalamin (VITAMIN B12) 1000 MCG tablet, Take 1 tablet (1,000 mcg total) by mouth daily., Disp: 90 tablet, Rfl: 1   ferrous sulfate 325 (65 FE) MG EC tablet, Take 1 tablet (325 mg total) by mouth daily with breakfast., Disp: 30 tablet, Rfl: 3  folic acid (FOLVITE) 1 MG tablet, Take 1 tablet (1 mg total) by mouth daily., Disp: 30 tablet, Rfl: 3   hydrOXYzine (ATARAX) 25 MG tablet, Take 1 tablet (25 mg total) by mouth at bedtime as needed for itching., Disp: 30 tablet, Rfl: 11   ipratropium-albuterol (DUONEB) 0.5-2.5 (3) MG/3ML SOLN, Take 3 mLs by nebulization every 6 (six) hours as needed. (Patient taking differently: Take 3 mLs by nebulization every 6 (six) hours as needed (shortness of breathing or wheezing).), Disp: 360 mL, Rfl: 0   lidocaine (LIDODERM) 5 %, Place 1 patch onto the skin daily. Remove & Discard patch within 12 hours or as directed by MD. Apply to low back, Disp: 30 patch,  Rfl: 0   lidocaine-prilocaine (EMLA) cream, Apply 1 Application topically as needed., Disp: 30 g, Rfl: 0   losartan (COZAAR) 100 MG tablet, Take 100 mg by mouth daily., Disp: , Rfl:    olmesartan (BENICAR) 40 MG tablet, Take 40 mg by mouth daily., Disp: , Rfl:    ondansetron (ZOFRAN) 8 MG tablet, Take 1 tablet (8 mg total) by mouth every 8 (eight) hours as needed., Disp: 30 tablet, Rfl: 0   polyethylene glycol (MIRALAX) 17 g packet, Take 17 g by mouth daily., Disp: 30 each, Rfl: 2   potassium chloride SA (KLOR-CON M) 20 MEQ tablet, TAKE 1 TABLET(20 MEQ) BY MOUTH DAILY, Disp: 90 tablet, Rfl: 0   prochlorperazine (COMPAZINE) 10 MG tablet, Take 1 tablet (10 mg total) by mouth every 6 (six) hours as needed for nausea or vomiting., Disp: 30 tablet, Rfl: 0   senna-docusate (STIMULANT LAXATIVE) 8.6-50 MG tablet, Take 2 tablets by mouth 2 (two) times daily. May add additional tablet as needed, Disp: 150 tablet, Rfl: 3   sodium chloride 1 g tablet, Take 1 tablet (1 g total) by mouth 3 (three) times daily with meals., Disp: 90 tablet, Rfl: 0   thiamine 100 MG tablet, Take 1 tablet (100 mg total) by mouth daily., Disp: , Rfl:    traMADol (ULTRAM) 50 MG tablet, Take 1-2 tablets (50-100 mg total) by mouth every 6 (six) hours as needed., Disp: 90 tablet, Rfl: 0:   Allergies  Allergen Reactions   Morphine Itching   Oxycodone Itching  :   Family History  Problem Relation Age of Onset   Cancer Cousin        maternal cousin   Cancer Cousin        paternal cousin   Cancer Cousin    Colon polyps Neg Hx    Pancreatic disease Neg Hx    Pancreatic cancer Neg Hx    Breast cancer Neg Hx    Colon cancer Neg Hx   :   Social History   Socioeconomic History   Marital status: Single    Spouse name: Not on file   Number of children: Not on file   Years of education: Not on file   Highest education level: Not on file  Occupational History   Occupation: retired  Tobacco Use   Smoking status: Former     Current packs/day: 0.00    Types: Cigarettes    Quit date: 11/22/2019    Years since quitting: 2.7   Smokeless tobacco: Never   Tobacco comments:    Patient reports quit 3 years ago. 06/25/20. [facility]  Vaping Use   Vaping status: Never Used  Substance and Sexual Activity   Alcohol use: Yes    Alcohol/week: 12.0 standard drinks of alcohol  Types: 12 Cans of beer per week    Comment: daily sometimes   Drug use: No   Sexual activity: Not Currently  Other Topics Concern   Not on file  Social History Narrative   Not on file   Social Determinants of Health   Financial Resource Strain: Not on file  Food Insecurity: Not on file  Transportation Needs: Unmet Transportation Needs (08/18/2022)   PRAPARE - Administrator, Civil Service (Medical): Yes    Lack of Transportation (Non-Medical): No  Physical Activity: Not on file  Stress: Not on file  Social Connections: Not on file  Intimate Partner Violence: Not on file  :  REVIEW OF SYSTEMS:  Patient endorses a 1 month history of right hip pain. This   PHYSICAL EXAM:  Blood pressure 120/78, pulse 77, temperature 98 F (36.7 C), resp. rate 20, height 5\' 9"  (1.753 m), SpO2 100%.  In general this is a well appearing male in no acute distress. He's alert and oriented x4 and appropriate throughout the examination. Cardiopulmonary assessment is negative for acute distress and he exhibits normal effort.      KPS = 70  100 - Normal; no complaints; no evidence of disease. 90   - Able to carry on normal activity; minor signs or symptoms of disease. 80   - Normal activity with effort; some signs or symptoms of disease. 96   - Cares for self; unable to carry on normal activity or to do active work. 60   - Requires occasional assistance, but is able to care for most of his personal needs. 50   - Requires considerable assistance and frequent medical care. 40   - Disabled; requires special care and assistance. 30   - Severely disabled;  hospital admission is indicated although death not imminent. 20   - Very sick; hospital admission necessary; active supportive treatment necessary. 10   - Moribund; fatal processes progressing rapidly. 0     - Dead  Karnofsky DA, Abelmann WH, Craver LS and Burchenal [facility] [phone]) The use of the nitrogen mustards in the palliative treatment of carcinoma: with particular reference to bronchogenic carcinoma Cancer 1 [phone]  LABORATORY DATA:  Lab Results  Component Value Date   WBC 11.4 (H) 08/21/2022   HGB 11.4 (L) 08/21/2022   HCT 33.9 (L) 08/21/2022   MCV 90.6 08/21/2022   PLT 289 08/21/2022   Lab Results  Component Value Date   NA 118 (LL) 08/18/2022   K 5.1 08/18/2022   CL 86 (L) 08/18/2022   CO2 23 08/18/2022   Lab Results  Component Value Date   ALT 9 08/18/2022   AST 19 08/18/2022   ALKPHOS 90 08/18/2022   BILITOT 0.6 08/18/2022     RADIOGRAPHY: CT CHEST ABDOMEN PELVIS W CONTRAST  Result Date: 08/11/2022 CLINICAL DATA:  Metastatic lung cancer, worsening right hip pain * Tracking Code: BO * EXAM: CT CHEST, ABDOMEN, AND PELVIS WITH CONTRAST TECHNIQUE: Multidetector CT imaging of the chest, abdomen and pelvis was performed following the standard protocol during bolus administration of intravenous contrast. RADIATION DOSE REDUCTION: This exam was performed according to the departmental dose-optimization program which includes automated exposure control, adjustment of the mA and/or kV according to patient size and/or use of iterative reconstruction technique. CONTRAST:  OMNIPAQUE IOHEXOL 300 MG/ML  SOLN COMPARISON:  06/12/2022 FINDINGS: CT CHEST FINDINGS Cardiovascular: Right chest port catheter. Aortic atherosclerosis. Normal heart size. Three-vessel coronary artery calcifications no pericardial effusion. Mediastinum/Nodes: No enlarged mediastinal,  hilar, or axillary lymph nodes. Thyroid gland, trachea, and esophagus demonstrate no significant findings. Lungs/Pleura: Unchanged post  treatment/post radiation appearance of the chest, with bandlike, treated mass of the central left upper lobe and additional bandlike scarring and fibrosis of the perihilar left lung (series 6, image 43, 59). Severe, bullous emphysema of the left apex. No pleural effusion or pneumothorax. Musculoskeletal: No chest wall abnormality. No acute osseous findings. Unchanged sclerotic metastasis involving the left scapular body and acromion. CT ABDOMEN PELVIS FINDINGS Hepatobiliary: No solid liver abnormality is seen. Unchanged hepatic cysts and additional low-attenuation lesions too small to confidently characterize although almost certainly additional cysts, all unchanged. No gallstones, gallbladder wall thickening, or biliary dilatation. Pancreas: Unremarkable. No pancreatic ductal dilatation or surrounding inflammatory changes. Spleen: Involuted spleen (series 2, image 66). Adrenals/Urinary Tract: Adrenal glands are unremarkable. Simple, benign bilateral renal cortical and parapelvic cysts, for which no further follow-up or characterization is required kidneys are otherwise normal, without renal calculi, solid lesion, or hydronephrosis. Bladder is unremarkable. Stomach/Bowel: Stomach is within normal limits. Appendix appears normal. No evidence of bowel wall thickening, distention, or inflammatory changes. Occasional sigmoid diverticula. Vascular/Lymphatic: Severe aortic atherosclerosis. No enlarged abdominal or pelvic lymph nodes. Reproductive: No mass or other abnormality. Other: No abdominal wall hernia or abnormality. No ascites. Musculoskeletal: No acute osseous findings. Interval enlargement of multiple mixed lytic and sclerotic osseous metastases involving the right pelvis, particularly including a large metastasis of the right iliac wing with an associated soft tissue component measuring 4.0 x 3.4 cm, previously 2.6 x 1.8 cm (series 2, image 96) as well as additional smaller lesions of the posterior right iliac  body adjacent to the sacroiliac joint (series 2, image 93) and posterior right acetabulum (series 2, image 107). Other heterogeneously sclerotic metastases of the sacrum and left hemipelvis are not significantly changed. IMPRESSION: 1. Interval enlargement of multiple mixed lytic and sclerotic osseous metastases involving the right pelvis, particularly including a large metastasis of the right iliac wing with an associated soft tissue component measuring 4.0 x 3.4 cm, previously 2.6 x 1.8 cm, as well as additional smaller lesions of the posterior right iliac body adjacent to the sacroiliac joint and posterior right acetabulum. 2. Other heterogeneously sclerotic osseous metastases including of the left scapula, sacrum, and left hemipelvis are unchanged 3. Unchanged post treatment/post radiation appearance of the chest, with bandlike, treated mass of the central left upper lobe and additional bandlike scarring and fibrosis of the perihilar left lung. 4. No evidence of lymphadenopathy or soft tissue metastases in the chest, abdomen, or pelvis. 5. Severe emphysema. 6. Coronary artery disease. Aortic Atherosclerosis (ICD10-I70.0) and Emphysema (ICD10-J43.9). Electronically Signed   By: Jearld Lesch M.D.   On: 08/11/2022 16:13      IMPRESSION/PLAN: 67 yo gentleman with metastatic lung cancer with painful osseous metastasis in the right pelvis.   Today, we talked to the patient about the findings and workup thus far. We discussed the natural history of metastatic NSCLC and general treatment, highlighting the role of radiotherapy in the management. We discussed the available radiation techniques, and focused on the details and logistics of delivery.  The recommendation is for a 2-week course of daily, palliative radiotherapy to the painful disease in the right pelvis.  We reviewed the anticipated acute and late sequelae associated with radiation in this setting. The patient was encouraged to ask questions that were  answered to his stated satisfaction.   At the conclusion of our conversation, the patient elects to proceed with  the recommended 2-week course of daily, palliative radiotherapy to the painful disease in the right pelvis. He has freely signed written consent to proceed today in the office and a copy of this document will be placed in his medical record. We will share our discussion with Dr. Leonides Schanz and the patient will proceed with CT simulation tomorrow, in anticipation of beginning his treatments next week. We enjoyed meeting with him again today and look forward to continuing to participate in his care. He knows that he is welcome to call at anytime in the interim with any questions or concerns related to radiation.     I spent 60 minutes minutes face to face with the patient and more than 50% of that time was spent in counseling and/or coordination of care.   ------------------------------------------------   Joyice Faster, PA-C    Margaretmary Dys, MD  [facility]  Radiation Oncology Direct Dial: [phone]  Fax: [phone] [email or web address]  Skype  LinkedIn

## 2022-08-21 NOTE — Progress Notes (Signed)
Histology and Location of Primary Cancer: Stage IV Left lung upper lobe  Sites of Visceral and Bony Metastatic Disease: Right Hip  Location(s) of Symptomatic Metastases: Right Hip  08/09/2022 Georga Kaufmann, PA-C CT Chest Abdomen Pelvis with Contrast CLINICAL DATA:  Metastatic lung cancer, worsening right hip pain * Tracking Code: BO *  IMPRESSION: 1. Interval enlargement of multiple mixed lytic and sclerotic osseous metastases involving the right pelvis, particularly including a large metastasis of the right iliac wing with an associated soft tissue component measuring 4.0 x 3.4 cm, previously 2.6 x 1.8 cm, as well as additional smaller lesions of the posterior right iliac body adjacent to the sacroiliac joint and posterior right acetabulum. 2. Other heterogeneously sclerotic osseous metastases including of the left scapula, sacrum, and left hemipelvis are unchanged 3. Unchanged post treatment/post radiation appearance of the chest, with bandlike, treated mass of the central left upper lobe and additional bandlike scarring and fibrosis of the perihilar left lung. 4. No evidence of lymphadenopathy or soft tissue metastases in the chest, abdomen, or pelvis. 5. Severe emphysema. 6. Coronary artery disease.   Aortic Atherosclerosis (ICD10-I70.0) and Emphysema (ICD10-J43.9).  06/12/2022 Dr. Jeanie Sewer CT Chest Abdomen Pelvis with Contrast CLINICAL DATA:  Non-small-cell lung cancer. Evaluate treatment response. Alcohol abuse. Status post radiation therapy in 2023. Chemotherapy earlier this year. Remote history of prostate cancer. * Tracking Code: BO *  IMPRESSION: 1. New 4 mm right lower lobe pulmonary nodule, suspicious for metastatic disease. 2. Similar appearance of left upper lobe radiation change. 3. Similar widespread osseous metastasis. 4. No soft tissue metastasis within the abdomen or pelvis. 5. Aortic atherosclerosis (ICD10-I70.0), coronary artery atherosclerosis and  emphysema (ICD10-J43.9).   03/07/2022 Dr. Jeanie Sewer CT Chest Abdomen Pelvis CLINICAL DATA:  History of metastatic non-small cell lung cancer status post radiation therapy (completed in 2023) undergoing ongoing chemotherapy. Additional history of prostate cancer diagnosed in 2019. Follow-up study. * Tracking Code: BO *  IMPRESSION: 1. Generally stable examination demonstrating multiple osseous metastases, which appear grossly similar to the prior study. No new metastatic lesions to the bones. No new extra skeletal metastatic lesions confidently identified in the chest, abdomen or pelvis. 2. There is what appears to be evolving postradiation changes in the superior aspect of the left upper lobe adjacent to the treated left scapular lesion. Continued attention on follow-up studies is recommended to ensure the consolidation and contraction of these postradiation changes. 3. Diffuse bronchial wall thickening with moderate centrilobular and severe paraseptal emphysema; imaging findings suggestive of underlying COPD. 4. Aortic atherosclerosis, in addition to left main and three-vessel coronary artery disease. Please note that although the presence of coronary artery calcium documents the presence of coronary artery disease, the severity of this disease and any potential stenosis cannot be assessed on this non-gated CT examination. Assessment for potential risk factor modification, dietary therapy or pharmacologic therapy may be warranted, if clinically indicated. 5. Additional incidental findings, as above, similar to prior studies.  Past/Anticipated chemotherapy by medical oncology, if any:   08/18/2022 Georga Kaufmann, PA-C   Pain on a scale of 0-10 is:  6/10 pain lower back radiates down right leg.  If Spine Met(s), symptoms, if any, include: Bowel/Bladder retention or incontinence (please describe):  No Numbness or weakness in extremities (please describe):  No Current Decadron  regimen, if applicable:  No  Ambulatory status? Walker? Wheelchair?: Uses cane for short distances and wheelchair for longer distances.  SAFETY ISSUES: Prior radiation? Yes, Left upper lobe lung (SBRT x3 fx  04/03/18-04/12/18), Left lung (SBRT x3 fx  04/08/20-04/19/20) Dr. Roselind Messier &  (prostate 08/20/19-10/07/19 , 70 Gy 28 fx of 2.5 GY) Dr. Kathrynn Running Pacemaker/ICD? No Possible current pregnancy? Male Is the patient on methotrexate? No  Current Complaints / other details:

## 2022-08-22 ENCOUNTER — Ambulatory Visit
Admission: RE | Admit: 2022-08-22 | Discharge: 2022-08-22 | Disposition: A | Discharge status: Home / Self Care | Payer: Medicare HMO | Source: Ambulatory Visit | Attending: Radiation Oncology | Admitting: Radiation Oncology

## 2022-08-22 DIAGNOSIS — C3412 Malignant neoplasm of upper lobe, left bronchus or lung: Secondary | ICD-10-CM | POA: Diagnosis not present

## 2022-08-22 DIAGNOSIS — Z510 Encounter for antineoplastic radiation therapy: Secondary | ICD-10-CM | POA: Insufficient documentation

## 2022-08-22 DIAGNOSIS — C7951 Secondary malignant neoplasm of bone: Secondary | ICD-10-CM | POA: Insufficient documentation

## 2022-08-22 LAB — T4: T4, Total: 6.9 ug/dL (ref 4.5–12.0)

## 2022-08-22 LAB — TSH: TSH: 1.347 u[IU]/mL (ref 0.350–4.500)

## 2022-08-22 NOTE — Progress Notes (Signed)
  Radiation Oncology         [phone] ________________________________  Name: Terry Page. MRN: [id number]  Date: 08/22/2022  DOB: [birth year]  SIMULATION AND TREATMENT PLANNING NOTE    ICD-10-CM   1. 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   NARRATIVE:  The patient was brought to the CT Simulation planning suite.  Identity was confirmed.  All relevant records and images related to the planned course of therapy were reviewed.  The patient freely provided informed written consent to proceed with treatment after reviewing the details related to the planned course of therapy. The consent form was witnessed and verified by the simulation staff.  Then, the patient was set-up in a stable reproducible  supine position for radiation therapy.  CT images were obtained.  Surface markings were placed.  The CT images were loaded into the planning software.  Then the target and avoidance structures were contoured.  Treatment planning then occurred.  The radiation prescription was entered and confirmed.  Then, I designed and supervised the construction of a total of 3 medically necessary complex treatment devices consisting of leg positioner and MLC apertures to cover the treated hip area.  I have requested : 3D Simulation  I have requested a DVH of the following structures: Rectum, Bladder, femoral heads and target.  PLAN:  The patient will receive 30 Gy in 10 fractions to the painful osseous metastasis in the right iliac wing.  ________________________________  Artist Pais. Kathrynn Running, M.D.

## 2022-08-24 DIAGNOSIS — Z510 Encounter for antineoplastic radiation therapy: Secondary | ICD-10-CM | POA: Diagnosis not present

## 2022-08-25 ENCOUNTER — Inpatient Hospital Stay: Payer: Medicare HMO | Admitting: Hematology and Oncology

## 2022-08-25 ENCOUNTER — Inpatient Hospital Stay (HOSPITAL_BASED_OUTPATIENT_CLINIC_OR_DEPARTMENT_OTHER): Payer: Medicare HMO

## 2022-08-25 ENCOUNTER — Inpatient Hospital Stay: Payer: Medicare HMO

## 2022-08-25 VITALS — BP 103/66 | HR 68 | Temp 98.0°F | Resp 14

## 2022-08-25 DIAGNOSIS — Z95828 Presence of other vascular implants and grafts: Secondary | ICD-10-CM

## 2022-08-25 DIAGNOSIS — Z5112 Encounter for antineoplastic immunotherapy: Secondary | ICD-10-CM | POA: Diagnosis not present

## 2022-08-25 DIAGNOSIS — C3412 Malignant neoplasm of upper lobe, left bronchus or lung: Secondary | ICD-10-CM

## 2022-08-25 LAB — CMP (CANCER CENTER ONLY)
ALT: 10 U/L (ref 0–44)
AST: 16 U/L (ref 15–41)
Albumin: 4.1 g/dL (ref 3.5–5.0)
Alkaline Phosphatase: 83 U/L (ref 38–126)
Anion gap: 7 (ref 5–15)
BUN: 9 mg/dL (ref 8–23)
CO2: 22 mmol/L (ref 22–32)
Calcium: 9.4 mg/dL (ref 8.9–10.3)
Chloride: 93 mmol/L — ABNORMAL LOW (ref 98–111)
Creatinine: 1.01 mg/dL (ref 0.61–1.24)
GFR, Estimated: >60 mL/min (ref >60)
Glucose, Bld: 82 mg/dL (ref 70–99)
Potassium: 4.6 mmol/L (ref 3.5–5.1)
Sodium: 122 mmol/L — ABNORMAL LOW (ref 135–145)
Total Bilirubin: 0.3 mg/dL (ref 0.3–1.2)
Total Protein: 8.4 g/dL — ABNORMAL HIGH (ref 6.5–8.1)

## 2022-08-25 LAB — CBC WITH DIFFERENTIAL (CANCER CENTER ONLY)
Abs Immature Granulocytes: 0.12 10*3/uL — ABNORMAL HIGH (ref 0.00–0.07)
Basophils Absolute: 0 10*3/uL (ref 0.0–0.1)
Basophils Relative: 0 %
Eosinophils Absolute: 0.2 10*3/uL (ref 0.0–0.5)
Eosinophils Relative: 2 %
HCT: 31.4 % — ABNORMAL LOW (ref 39.0–52.0)
Hemoglobin: 10.9 g/dL — ABNORMAL LOW (ref 13.0–17.0)
Immature Granulocytes: 1 %
Lymphocytes Relative: 13 %
Lymphs Abs: 1.5 10*3/uL (ref 0.7–4.0)
MCH: 31.1 pg (ref 26.0–34.0)
MCHC: 34.7 g/dL (ref 30.0–36.0)
MCV: 89.5 fL (ref 80.0–100.0)
Monocytes Absolute: 1.1 10*3/uL — ABNORMAL HIGH (ref 0.1–1.0)
Monocytes Relative: 10 %
Neutro Abs: 8.2 10*3/uL — ABNORMAL HIGH (ref 1.7–7.7)
Neutrophils Relative %: 74 %
Platelet Count: 279 10*3/uL (ref 150–400)
RBC: 3.51 MIL/uL — ABNORMAL LOW (ref 4.22–5.81)
RDW: 13.2 % (ref 11.5–15.5)
WBC Count: 11.1 10*3/uL — ABNORMAL HIGH (ref 4.0–10.5)
nRBC: 0 % (ref 0.0–0.2)

## 2022-08-25 MED ORDER — SODIUM CHLORIDE 0.9% FLUSH
10.0000 mL | Freq: Once | INTRAVENOUS | Status: AC
  Administered 2022-08-25: 10 mL

## 2022-08-25 MED ORDER — SODIUM CHLORIDE 0.9% FLUSH
10.0000 mL | INTRAVENOUS | Status: DC | PRN
  Administered 2022-08-25: 10 mL

## 2022-08-25 MED ORDER — HYDROMORPHONE HCL 2 MG PO TABS: 2.0000 mg | ORAL_TABLET | Freq: Four times a day (QID) | ORAL | 0 refills | Status: DC | PRN

## 2022-08-25 MED ORDER — SODIUM CHLORIDE 0.9 % IV SOLN: Freq: Once | INTRAVENOUS | Status: AC

## 2022-08-25 MED ORDER — HEPARIN SOD (PORK) LOCK FLUSH 100 UNIT/ML IV SOLN
500.0000 [IU] | Freq: Once | INTRAVENOUS | Status: AC | PRN
  Administered 2022-08-25: 500 [IU]

## 2022-08-25 MED ORDER — SODIUM CHLORIDE 0.9 % IV SOLN
200.0000 mg | Freq: Once | INTRAVENOUS | Status: AC
  Administered 2022-08-25: 200 mg via INTRAVENOUS
  Filled 2022-08-25: qty 200

## 2022-08-25 NOTE — Progress Notes (Signed)
[facility] [phone]   [phone]  PROGRESS NOTE  Patient Care Team: Aliene Beams, MD as PCP - General (Family Medicine)  Hematological/Oncological History # Metastatic Adenocarcinoma of the Lung  01/07/2020 : CT of the chest with contrast performed which showed interval enlargement of a spiculated nodule in the central left upper lobe measuring 1.2 x 1.0 cm and highly suspicious for primary lung malignancy 02/06/2020 :PET scan performed which showed a hypermetabolic irregular solid 1.3 cm left upper lobe pulmonary nodule compatible with malignancy without any other hypermetabolic lesions 02/09/4130- 04/19/2020:  SBRT to the lung lesion 04/28/2021: CT C/A/P showed multifocal lytic bone metastases are identified. Lesion within the spine of the left scapula and lesions involving bilateral iliac bones and left sacral wing. Establish care with Dr. Leonides Schanz  05/03/2021: biopsy of right iliac lytic lesion showed metastatic carcinoma, consistent with lung primary.  07/08/2021: Cycle 1 Day 1 of Carbo/Pem/Pem 07/29/2021: Cycle 2 Day 1 of Carbo/Pem/Pem 08/11/2021-08/24/2021: Received palliative radiation to osseous metastasis in the left shoulder. 30 Gy in 10 fractions.  08/19/2021: Cycle 3 Day 1 of Carbo/Pem/Pem 09/09/2021: Cycle 4 Day 1 of Carbo/Pem/Pem 09/23/2021: CT CAP: stable disease 09/30/2021: Cycle 5 Day 1 of Pem/Pem 10/21/2021: Cycle 6 Day 1 of Pembrolizumab. Held pemetrexed due to anemia. 11/16/2021: Cycle 7 Day 1 of Pem/Pem.   12/16/2021: Cycle 8 Day 1 of Pem/Pem.   01/06/2022: Cycle 9 Day 1 of Pem/Pem.  01/27/2022: Cycle 10 Day 1 of Pem/Pem 02/17/2022: Cycle 11 Day 1 of Pem/Pem 03/10/2022: Cycle 12 Day 1 of Pem/Pem (HELD due to anemia with Hgb of 6.4) 03/17/2022: Cycle 12 Day 1 of Pem/Pem (HELD pemextrexed due to cytopenias/kidney function) 04/10/2022: Cycle 13 Day 1 of Pem/Pem (HELD pemextrexed due to cytopenias/kidney function) 04/28/2022: Cycle 14 Day 1 of Pem/Pem  (HELD pemextrexed due to cytopenias/kidney function) 05/23/2022:  Cycle 15 Day 1 of Pem/Pem (HELD pemextrexed due to cytopenias/kidney function) 06/15/2022: Cycle 16 Day 1 of Pembrolizumab.  07/07/2022: Cycle 17 Day 1 of Pembrolizumab.  07/28/2022: Cycle 18 Day 1 of Pembrolizumab 08/18/2022: Cycle 19 Day 1 of Pembrolizumab HELD due to hyponatremia of 118, new bone metastases.   #Adenocarcinoma of the Prostate, T1cN0M0 08/19/2019-10/07/2019: 70 Gy in 28 fractions of 2.5 Gy.  Radiation to the prostate was under the care of Dr. Margaretmary Dys  Interval History:  [name]. 67 y.o. male with medical history significant for metastatic adenocarcinoma of the lung who presents for a follow up visit. The patient's last visit was on 07/28/2022. In the interim since the last visit he completed Cycle 18 with single agent pembrolizumab. He is unaccompanied for this visit.   On exam today, Mr. Blanken reports other than the pain he has been experiencing has been feeling quite good.  He reports his pain is currently about a 6 out of 10 in severity.  He is taking his tramadol about 5 times a day and unfortunately not providing all the relief he needs.  He is allergic to oxycodone with proto for to avoid that.  He notes he would be agreeable to attempting Dilaudid therapy.  His sodium has been improving he is taking 2 salt pills per day.  He is not noticing any difference in his symptoms.  He notes he is not having any swelling of his lower extremities or abdomen.  He notes he is also not having any bleeding, bruising, or dark stools.  He is doing his best to try to keep up with hydration.  His appetite has been good and overall his weight has been steady.  He is had no other major changes in his health and denies any runny nose, sore throat, or cough.  He denies fevers, chills, sweats, shortness of breath, chest pain, cough, headaches or dizziness. He has no other complaints. A full 10 point ROS is listed  below.  MEDICAL HISTORY:  Past Medical History:  Diagnosis Date   Alcohol abuse    Asthma    Bullous emphysema ([name])    Essential hypertension 04/10/2020   Hemorrhoids    History of radiation therapy 04/08/20-04/19/20   IMRT- Left lung- Dr. Antony Blackbird   Incidental lung nodule, greater than or equal to 8mm 10/22/2017   Left upper lobe - discovered on CTA   Prostate cancer ([name])    Spontaneous pneumothorax 10/20/2017   right   Tobacco abuse     SURGICAL HISTORY: Past Surgical History:  Procedure Laterality Date   BACK SURGERY     IR IMAGING GUIDED PORT INSERTION  06/29/2021   PROSTATE BIOPSY      SOCIAL HISTORY: Social History   Socioeconomic History   Marital status: Single    Spouse name: Not on file   Number of children: Not on file   Years of education: Not on file   Highest education level: Not on file  Occupational History   Occupation: retired  Tobacco Use   Smoking status: Former    Current packs/day: 0.00    Types: Cigarettes    Quit date: 11/22/2019    Years since quitting: 2.7   Smokeless tobacco: Never   Tobacco comments:    Patient reports quit 3 years ago. 06/25/20. [facility]  Vaping Use   Vaping status: Never Used  Substance and Sexual Activity   Alcohol use: Yes    Alcohol/week: 12.0 standard drinks of alcohol    Types: 12 Cans of beer per week    Comment: daily sometimes   Drug use: No   Sexual activity: Not Currently  Other Topics Concern   Not on file  Social History Narrative   Not on file   Social Determinants of Health   Financial Resource Strain: Not on file  Food Insecurity: Not on file  Transportation Needs: Unmet Transportation Needs (08/18/2022)   PRAPARE - Administrator, Civil Service (Medical): Yes    Lack of Transportation (Non-Medical): No  Physical Activity: Not on file  Stress: Not on file  Social Connections: Not on file  Intimate Partner Violence: Not on file    FAMILY HISTORY: Family History  Problem  Relation Age of Onset   Cancer Cousin        maternal cousin   Cancer Cousin        paternal cousin   Cancer Cousin    Colon polyps Neg Hx    Pancreatic disease Neg Hx    Pancreatic cancer Neg Hx    Breast cancer Neg Hx    Colon cancer Neg Hx     ALLERGIES:  is allergic to morphine and oxycodone.  MEDICATIONS:  Current Outpatient Medications  Medication Sig Dispense Refill   HYDROmorphone (DILAUDID) 2 MG tablet Take 1 tablet (2 mg total) by mouth every 6 (six) hours as needed for severe pain. 30 tablet 0   acetaminophen (TYLENOL) 325 MG tablet Take 2 tablets (650 mg total) by mouth every 6 (six) hours as needed for mild pain or headache (fever >/= 101).     albuterol (VENTOLIN HFA)  108 (90 Base) MCG/ACT inhaler Inhale 2 puffs into the lungs every 6 (six) hours as needed for wheezing or shortness of breath. 8 g 1   amLODipine (NORVASC) 10 MG tablet Take 1 tablet by mouth daily.     atorvastatin (LIPITOR) 20 MG tablet Take 20 mg by mouth daily.     cyanocobalamin (VITAMIN B12) 1000 MCG tablet Take 1 tablet (1,000 mcg total) by mouth daily. 90 tablet 1   ferrous sulfate 325 (65 FE) MG EC tablet Take 1 tablet (325 mg total) by mouth daily with breakfast. 30 tablet 3   folic acid (FOLVITE) 1 MG tablet Take 1 tablet (1 mg total) by mouth daily. 30 tablet 3   hydrOXYzine (ATARAX) 25 MG tablet Take 1 tablet (25 mg total) by mouth at bedtime as needed for itching. 30 tablet 11   ipratropium-albuterol (DUONEB) 0.5-2.5 (3) MG/3ML SOLN Take 3 mLs by nebulization every 6 (six) hours as needed. (Patient taking differently: Take 3 mLs by nebulization every 6 (six) hours as needed (shortness of breathing or wheezing).) 360 mL 0   lidocaine (LIDODERM) 5 % Place 1 patch onto the skin daily. Remove & Discard patch within 12 hours or as directed by MD. Apply to low back 30 patch 0   lidocaine-prilocaine (EMLA) cream Apply 1 Application topically as needed. 30 g 0   losartan (COZAAR) 100 MG tablet Take 100  mg by mouth daily.     olmesartan (BENICAR) 40 MG tablet Take 40 mg by mouth daily.     ondansetron (ZOFRAN) 8 MG tablet Take 1 tablet (8 mg total) by mouth every 8 (eight) hours as needed. 30 tablet 0   polyethylene glycol (MIRALAX) 17 g packet Take 17 g by mouth daily. 30 each 2   potassium chloride SA (KLOR-CON M) 20 MEQ tablet TAKE 1 TABLET(20 MEQ) BY MOUTH DAILY 90 tablet 0   prochlorperazine (COMPAZINE) 10 MG tablet Take 1 tablet (10 mg total) by mouth every 6 (six) hours as needed for nausea or vomiting. 30 tablet 0   senna-docusate (STIMULANT LAXATIVE) 8.6-50 MG tablet Take 2 tablets by mouth 2 (two) times daily. May add additional tablet as needed 150 tablet 3   sodium chloride 1 g tablet Take 1 tablet (1 g total) by mouth 3 (three) times daily with meals. 90 tablet 0   thiamine 100 MG tablet Take 1 tablet (100 mg total) by mouth daily.     traMADol (ULTRAM) 50 MG tablet Take 1-2 tablets (50-100 mg total) by mouth every 6 (six) hours as needed. 90 tablet 0   No current facility-administered medications for this visit.    REVIEW OF SYSTEMS:   Constitutional: ( - ) fevers, ( - )  chills , ( - ) night sweats Eyes: ( - ) blurriness of vision, ( - ) double vision, ( - ) watery eyes Ears, nose, mouth, throat, and face: ( - ) mucositis, ( - ) sore throat Respiratory: ( - ) cough, ( - ) dyspnea, ( - ) wheezes Cardiovascular: ( - ) palpitation, ( - ) chest discomfort, ( - ) lower extremity swelling Gastrointestinal:  ( - ) nausea, ( - ) heartburn, ( - ) change in bowel habits Skin: ( - ) abnormal skin rashes Lymphatics: ( - ) new lymphadenopathy, ( - ) easy bruising Neurological: ( - ) numbness, ( - ) tingling, ( - ) new weaknesses Behavioral/Psych: ( - ) mood change, ( - ) new changes  All other systems  were reviewed with the patient and are negative.  PHYSICAL EXAMINATION: ECOG PERFORMANCE STATUS: 1 - Symptomatic but completely ambulatory  There were no vitals filed for this  visit.   There were no vitals filed for this visit.    GENERAL: Well-appearing elderly African-American male, alert, no distress and comfortable SKIN: skin color, texture, turgor are normal, no rashes or significant lesions EYES: conjunctiva are pink and non-injected, sclera clear LUNGS: clear to auscultation and percussion with normal breathing effort HEART: regular rate & rhythm and no murmurs and no lower extremity edema Musculoskeletal: no cyanosis of digits and no clubbing  PSYCH: alert & oriented x 3, fluent speech NEURO: no focal motor/sensory deficits  LABORATORY DATA:  I have reviewed the data as listed    Latest Ref Rng & Units 08/25/2022    2:00 PM 08/21/2022    3:10 PM 08/18/2022   10:42 AM  CBC  WBC 4.0 - 10.5 K/uL 11.1  11.4  11.9   Hemoglobin 13.0 - 17.0 g/dL 09.8  11.9  14.7   Hematocrit 39.0 - 52.0 % 31.4  33.9  35.0   Platelets 150 - 400 K/uL 279  289  316        Latest Ref Rng & Units 08/25/2022    2:00 PM 08/21/2022    3:10 PM 08/18/2022   12:19 PM  CMP  Glucose 70 - 99 mg/dL 82  829  562   BUN 8 - 23 mg/dL 9  14  15    Creatinine 0.61 - 1.24 mg/dL 1.30  8.65  7.84   Sodium 135 - 145 mmol/L 122  120  118   Potassium 3.5 - 5.1 mmol/L 4.6  4.3  5.1   Chloride 98 - 111 mmol/L 93  88  86   CO2 22 - 32 mmol/L 22  22  23    Calcium 8.9 - 10.3 mg/dL 9.4  9.6  9.7   Total Protein 6.5 - 8.1 g/dL 8.4  9.3  9.6   Total Bilirubin 0.3 - 1.2 mg/dL 0.3  0.4  0.6   Alkaline Phos 38 - 126 U/L 83  86  90   AST 15 - 41 U/L 16  21  19    ALT 0 - 44 U/L 10  11  9      Lab Results  Component Value Date   MPROTEIN Not Observed 04/28/2021   Lab Results  Component Value Date   KPAFRELGTCHN 33.6 (H) 04/28/2021   LAMBDASER 22.0 04/28/2021   KAPLAMBRATIO 1.53 04/28/2021    RADIOGRAPHIC STUDIES: CT CHEST ABDOMEN PELVIS W CONTRAST  Result Date: 08/11/2022 CLINICAL DATA:  Metastatic lung cancer, worsening right hip pain * Tracking Code: BO * EXAM: CT CHEST, ABDOMEN, AND  PELVIS WITH CONTRAST TECHNIQUE: Multidetector CT imaging of the chest, abdomen and pelvis was performed following the standard protocol during bolus administration of intravenous contrast. RADIATION DOSE REDUCTION: This exam was performed according to the departmental dose-optimization program which includes automated exposure control, adjustment of the mA and/or kV according to patient size and/or use of iterative reconstruction technique. CONTRAST:  OMNIPAQUE IOHEXOL 300 MG/ML  SOLN COMPARISON:  06/12/2022 FINDINGS: CT CHEST FINDINGS Cardiovascular: Right chest port catheter. Aortic atherosclerosis. Normal heart size. Three-vessel coronary artery calcifications no pericardial effusion. Mediastinum/Nodes: No enlarged mediastinal, hilar, or axillary lymph nodes. Thyroid gland, trachea, and esophagus demonstrate no significant findings. Lungs/Pleura: Unchanged post treatment/post radiation appearance of the chest, with bandlike, treated mass of the central left upper lobe and additional bandlike  scarring and fibrosis of the perihilar left lung (series 6, image 43, 59). Severe, bullous emphysema of the left apex. No pleural effusion or pneumothorax. Musculoskeletal: No chest wall abnormality. No acute osseous findings. Unchanged sclerotic metastasis involving the left scapular body and acromion. CT ABDOMEN PELVIS FINDINGS Hepatobiliary: No solid liver abnormality is seen. Unchanged hepatic cysts and additional low-attenuation lesions too small to confidently characterize although almost certainly additional cysts, all unchanged. No gallstones, gallbladder wall thickening, or biliary dilatation. Pancreas: Unremarkable. No pancreatic ductal dilatation or surrounding inflammatory changes. Spleen: Involuted spleen (series 2, image 66). Adrenals/Urinary Tract: Adrenal glands are unremarkable. Simple, benign bilateral renal cortical and parapelvic cysts, for which no further follow-up or characterization is required  kidneys are otherwise normal, without renal calculi, solid lesion, or hydronephrosis. Bladder is unremarkable. Stomach/Bowel: Stomach is within normal limits. Appendix appears normal. No evidence of bowel wall thickening, distention, or inflammatory changes. Occasional sigmoid diverticula. Vascular/Lymphatic: Severe aortic atherosclerosis. No enlarged abdominal or pelvic lymph nodes. Reproductive: No mass or other abnormality. Other: No abdominal wall hernia or abnormality. No ascites. Musculoskeletal: No acute osseous findings. Interval enlargement of multiple mixed lytic and sclerotic osseous metastases involving the right pelvis, particularly including a large metastasis of the right iliac wing with an associated soft tissue component measuring 4.0 x 3.4 cm, previously 2.6 x 1.8 cm (series 2, image 96) as well as additional smaller lesions of the posterior right iliac body adjacent to the sacroiliac joint (series 2, image 93) and posterior right acetabulum (series 2, image 107). Other heterogeneously sclerotic metastases of the sacrum and left hemipelvis are not significantly changed. IMPRESSION: 1. Interval enlargement of multiple mixed lytic and sclerotic osseous metastases involving the right pelvis, particularly including a large metastasis of the right iliac wing with an associated soft tissue component measuring 4.0 x 3.4 cm, previously 2.6 x 1.8 cm, as well as additional smaller lesions of the posterior right iliac body adjacent to the sacroiliac joint and posterior right acetabulum. 2. Other heterogeneously sclerotic osseous metastases including of the left scapula, sacrum, and left hemipelvis are unchanged 3. Unchanged post treatment/post radiation appearance of the chest, with bandlike, treated mass of the central left upper lobe and additional bandlike scarring and fibrosis of the perihilar left lung. 4. No evidence of lymphadenopathy or soft tissue metastases in the chest, abdomen, or pelvis. 5.  Severe emphysema. 6. Coronary artery disease. Aortic Atherosclerosis (ICD10-I70.0) and Emphysema (ICD10-J43.9). Electronically Signed   By: Jearld Lesch M.D.   On: 08/11/2022 16:13    ASSESSMENT & PLAN [name]. Is a 67 y.o.  male with medical history significant for metastatic adenocarcinoma of the lung who presents for a follow up visit.  # Metastatic Adenocarcinoma of the Lung  -- NGS testing from this patient shows no evidence of targetable mutation. --MRI brain shows no evidence of intracranial metastases --Started carboplatin, pembrolizumab, and pemetrexed as treatment for his cancer on 07/08/2021. --Received palliative radiation to osseous metastasis of the left shoulder from 08/11/2021-08/24/2021. Planned dose is 30 Gy in 10 fx.  --Discontinued Pemetrexed on 03/17/2022 due to persistent anemia and kidney dysfunction. Plan: --today is Day 1 Cycle 19 of Pembrolizumab today --labs from today were reviewed. Labs show white blood cell count 11.1, hemoglobin 10.9, MCV 89.5, and platelets of 279 --Restaging CT CAP from 08/09/2022 shows interval enlargement of multiple mixed lytic and sclerotic bone metastases involving the right pelvis. Other bone metastases including of the left scapula, sacrum, and left hemipelvis are unchanged. Unchanged  treated central LUL mass. --RTC in 3 week with labs, follow up visit   #Right hip pain: --Secondary progressive bone metastases in right pelvis seen on CT imaging from 08/09/2022 --Requested follow up with radiation oncology to evaluation for palliative radiation --Continue to take tramadol q 6 hours for pain control.   #Hyponatremia: --Chronic in nature --Sodium level dropped from 127 on 08/09/2022 to 118.  Today sodium improved to 122. --Etiologies including SIADH, immunotherapy induced, too much water intake.  --Patient is asymptomatic without nausea/vomiting, headaches, confusion, seizures, etc --Currently on salt tablets 2 gm PO daily. We advised  to titrate to twice daily x 7 days and then three times daily.  --Strict ED precautions given for any new symptoms as discussed above.   # Iron Deficiency Anemia #Chemotherapy induced anemia --Hgb level droppeed to 10.9 today --Currently on ferrous sulfate 325 mg PO daily  #Pain Control --stopped oxycodone due to itching --currently following with Palliative care.  --pain well controlled with tramadol as needed. Continue to take tramdol 50-100 mg q 6 hours as needed. --Continue senna docusate for constipation prophylaxis --Continue to monitor  #Supportive Care -- chemotherapy education complete -- port placed -- zofran 8mg  q8H PRN and compazine 10mg  PO q6H for nausea -- EMLA cream for port -- Patient referred to palliative care for pain control. Pain control as above.    No orders of the defined types were placed in this encounter.   All questions were answered. The patient knows to call the clinic with any problems, questions or concerns.  I have spent a total of 30 minutes minutes of face-to-face and non-face-to-face time, preparing to see the patient, performing a medically appropriate examination, counseling and educating the patient, referring and communicating with other health care professionals, documenting clinical information in the electronic health record, and care coordination.   Ulysees Barns, MD Department of Hematology/Oncology [facility] Phone: [phone] Pager: [phone] Email: [email or web address]   09/02/2022 2:01 PM

## 2022-08-25 NOTE — Patient Instructions (Signed)

## 2022-08-28 ENCOUNTER — Ambulatory Visit
Admission: RE | Admit: 2022-08-28 | Discharge: 2022-08-28 | Disposition: A | Discharge status: Home / Self Care | Payer: Medicare HMO | Source: Ambulatory Visit | Attending: Radiation Oncology | Admitting: Radiation Oncology

## 2022-08-28 ENCOUNTER — Other Ambulatory Visit: Payer: Self-pay

## 2022-08-28 DIAGNOSIS — Z510 Encounter for antineoplastic radiation therapy: Secondary | ICD-10-CM | POA: Diagnosis not present

## 2022-08-28 LAB — RAD ONC ARIA SESSION SUMMARY
Course Elapsed Days: 0
Plan Fractions Treated to Date: 1
Plan Prescribed Dose Per Fraction: 3 Gy
Plan Total Fractions Prescribed: 10
Plan Total Prescribed Dose: 30 Gy
Reference Point Dosage Given to Date: 3 Gy
Reference Point Session Dosage Given: 3 Gy
Session Number: 1

## 2022-08-29 ENCOUNTER — Ambulatory Visit
Admission: RE | Admit: 2022-08-29 | Discharge: 2022-08-29 | Disposition: A | Discharge status: Home / Self Care | Payer: Medicare HMO | Source: Ambulatory Visit | Attending: Radiation Oncology | Admitting: Radiation Oncology

## 2022-08-29 ENCOUNTER — Other Ambulatory Visit: Payer: Self-pay

## 2022-08-29 DIAGNOSIS — Z510 Encounter for antineoplastic radiation therapy: Secondary | ICD-10-CM | POA: Diagnosis not present

## 2022-08-29 LAB — RAD ONC ARIA SESSION SUMMARY
Course Elapsed Days: 1
Plan Fractions Treated to Date: 2
Plan Prescribed Dose Per Fraction: 3 Gy
Plan Total Fractions Prescribed: 10
Plan Total Prescribed Dose: 30 Gy
Reference Point Dosage Given to Date: 6 Gy
Reference Point Session Dosage Given: 3 Gy
Session Number: 2

## 2022-08-30 ENCOUNTER — Other Ambulatory Visit: Payer: Self-pay

## 2022-08-30 ENCOUNTER — Ambulatory Visit
Admission: RE | Admit: 2022-08-30 | Discharge: 2022-08-30 | Disposition: A | Discharge status: Home / Self Care | Payer: Medicare HMO | Source: Ambulatory Visit | Attending: Radiation Oncology | Admitting: Radiation Oncology

## 2022-08-30 DIAGNOSIS — Z510 Encounter for antineoplastic radiation therapy: Secondary | ICD-10-CM | POA: Diagnosis not present

## 2022-08-30 LAB — RAD ONC ARIA SESSION SUMMARY
Course Elapsed Days: 2
Plan Fractions Treated to Date: 3
Plan Prescribed Dose Per Fraction: 3 Gy
Plan Total Fractions Prescribed: 10
Plan Total Prescribed Dose: 30 Gy
Reference Point Dosage Given to Date: 9 Gy
Reference Point Session Dosage Given: 3 Gy
Session Number: 3

## 2022-08-31 ENCOUNTER — Ambulatory Visit
Admission: RE | Admit: 2022-08-31 | Discharge: 2022-08-31 | Disposition: A | Discharge status: Home / Self Care | Payer: Medicare HMO | Source: Ambulatory Visit | Attending: Radiation Oncology | Admitting: Radiation Oncology

## 2022-08-31 ENCOUNTER — Other Ambulatory Visit: Payer: Self-pay

## 2022-08-31 DIAGNOSIS — Z510 Encounter for antineoplastic radiation therapy: Secondary | ICD-10-CM | POA: Diagnosis not present

## 2022-08-31 LAB — RAD ONC ARIA SESSION SUMMARY
Course Elapsed Days: 3
Plan Fractions Treated to Date: 4
Plan Prescribed Dose Per Fraction: 3 Gy
Plan Total Fractions Prescribed: 10
Plan Total Prescribed Dose: 30 Gy
Reference Point Dosage Given to Date: 12 Gy
Reference Point Session Dosage Given: 3 Gy
Session Number: 4

## 2022-09-01 ENCOUNTER — Other Ambulatory Visit: Payer: Medicare HMO

## 2022-09-01 ENCOUNTER — Ambulatory Visit: Payer: Medicare HMO | Admitting: Physician Assistant

## 2022-09-01 ENCOUNTER — Ambulatory Visit: Payer: Medicare HMO | Admitting: Hematology and Oncology

## 2022-09-01 ENCOUNTER — Ambulatory Visit: Payer: Medicare HMO

## 2022-09-01 ENCOUNTER — Other Ambulatory Visit: Payer: Self-pay

## 2022-09-01 ENCOUNTER — Ambulatory Visit
Admission: RE | Admit: 2022-09-01 | Discharge: 2022-09-01 | Disposition: A | Discharge status: Home / Self Care | Payer: Medicare HMO | Source: Ambulatory Visit | Attending: Radiation Oncology | Admitting: Radiation Oncology

## 2022-09-01 DIAGNOSIS — Z510 Encounter for antineoplastic radiation therapy: Secondary | ICD-10-CM | POA: Diagnosis not present

## 2022-09-01 LAB — RAD ONC ARIA SESSION SUMMARY
Course Elapsed Days: 4
Plan Fractions Treated to Date: 5
Plan Prescribed Dose Per Fraction: 3 Gy
Plan Total Fractions Prescribed: 10
Plan Total Prescribed Dose: 30 Gy
Reference Point Dosage Given to Date: 15 Gy
Reference Point Session Dosage Given: 3 Gy
Session Number: 5

## 2022-09-02 ENCOUNTER — Encounter: Payer: Self-pay | Admitting: Hematology and Oncology

## 2022-09-05 ENCOUNTER — Other Ambulatory Visit: Payer: Self-pay

## 2022-09-05 ENCOUNTER — Ambulatory Visit
Admission: RE | Admit: 2022-09-05 | Discharge: 2022-09-05 | Disposition: A | Discharge status: Home / Self Care | Payer: Medicare HMO | Source: Ambulatory Visit | Attending: Radiation Oncology | Admitting: Radiation Oncology

## 2022-09-05 ENCOUNTER — Telehealth: Payer: Self-pay | Admitting: *Deleted

## 2022-09-05 DIAGNOSIS — C3412 Malignant neoplasm of upper lobe, left bronchus or lung: Secondary | ICD-10-CM | POA: Diagnosis not present

## 2022-09-05 DIAGNOSIS — Z510 Encounter for antineoplastic radiation therapy: Secondary | ICD-10-CM | POA: Insufficient documentation

## 2022-09-05 DIAGNOSIS — C7951 Secondary malignant neoplasm of bone: Secondary | ICD-10-CM | POA: Insufficient documentation

## 2022-09-05 LAB — RAD ONC ARIA SESSION SUMMARY
Course Elapsed Days: 8
Plan Fractions Treated to Date: 6
Plan Prescribed Dose Per Fraction: 3 Gy
Plan Total Fractions Prescribed: 10
Plan Total Prescribed Dose: 30 Gy
Reference Point Dosage Given to Date: 18 Gy
Reference Point Session Dosage Given: 3 Gy
Session Number: 6

## 2022-09-05 MED ORDER — HYDROMORPHONE HCL 2 MG PO TABS: 2.0000 mg | ORAL_TABLET | Freq: Four times a day (QID) | ORAL | 0 refills | Status: DC | PRN

## 2022-09-05 NOTE — Addendum Note (Signed)
Addended by: Georga Kaufmann T on: 09/05/2022 03:07 PM   Modules accepted: Orders

## 2022-09-05 NOTE — Telephone Encounter (Signed)
Patient called requesting refill request for Hydromorphone to be sent to pharmacy last sent to.    Routing to provider.

## 2022-09-06 ENCOUNTER — Other Ambulatory Visit: Payer: Self-pay

## 2022-09-06 ENCOUNTER — Ambulatory Visit
Admission: RE | Admit: 2022-09-06 | Discharge: 2022-09-06 | Disposition: A | Discharge status: Home / Self Care | Payer: Medicare HMO | Source: Ambulatory Visit | Attending: Radiation Oncology | Admitting: Radiation Oncology

## 2022-09-06 DIAGNOSIS — Z510 Encounter for antineoplastic radiation therapy: Secondary | ICD-10-CM | POA: Diagnosis not present

## 2022-09-06 LAB — RAD ONC ARIA SESSION SUMMARY
Course Elapsed Days: 9
Plan Fractions Treated to Date: 7
Plan Prescribed Dose Per Fraction: 3 Gy
Plan Total Fractions Prescribed: 10
Plan Total Prescribed Dose: 30 Gy
Reference Point Dosage Given to Date: 21 Gy
Reference Point Session Dosage Given: 3 Gy
Session Number: 7

## 2022-09-07 ENCOUNTER — Ambulatory Visit
Admission: RE | Admit: 2022-09-07 | Discharge: 2022-09-07 | Disposition: A | Discharge status: Home / Self Care | Payer: Medicare HMO | Source: Ambulatory Visit | Attending: Radiation Oncology | Admitting: Radiation Oncology

## 2022-09-07 ENCOUNTER — Other Ambulatory Visit: Payer: Self-pay

## 2022-09-07 ENCOUNTER — Telehealth: Payer: Self-pay

## 2022-09-07 DIAGNOSIS — Z510 Encounter for antineoplastic radiation therapy: Secondary | ICD-10-CM | POA: Diagnosis not present

## 2022-09-07 LAB — RAD ONC ARIA SESSION SUMMARY
Course Elapsed Days: 10
Plan Fractions Treated to Date: 8
Plan Prescribed Dose Per Fraction: 3 Gy
Plan Total Fractions Prescribed: 10
Plan Total Prescribed Dose: 30 Gy
Reference Point Dosage Given to Date: 24 Gy
Reference Point Session Dosage Given: 3 Gy
Session Number: 8

## 2022-09-07 NOTE — Telephone Encounter (Signed)
Pt LVM regarding upcoming appt. This RN returned call with 09/22/2022 flush appt time. Pt verbalized understanding.

## 2022-09-08 ENCOUNTER — Other Ambulatory Visit: Payer: Medicare HMO

## 2022-09-08 ENCOUNTER — Ambulatory Visit: Payer: Medicare HMO | Admitting: Physician Assistant

## 2022-09-08 ENCOUNTER — Ambulatory Visit
Admission: RE | Admit: 2022-09-08 | Discharge: 2022-09-08 | Disposition: A | Discharge status: Home / Self Care | Payer: Medicare HMO | Source: Ambulatory Visit | Attending: Radiation Oncology | Admitting: Radiation Oncology

## 2022-09-08 ENCOUNTER — Ambulatory Visit
Admission: RE | Admit: 2022-09-08 | Discharge: 2022-09-08 | Disposition: A | Discharge status: Home / Self Care | Payer: Medicare HMO | Source: Ambulatory Visit | Attending: Radiation Oncology

## 2022-09-08 ENCOUNTER — Other Ambulatory Visit: Payer: Self-pay

## 2022-09-08 ENCOUNTER — Ambulatory Visit: Payer: Medicare HMO

## 2022-09-08 ENCOUNTER — Encounter: Payer: Medicare HMO | Admitting: Dietician

## 2022-09-08 DIAGNOSIS — Z510 Encounter for antineoplastic radiation therapy: Secondary | ICD-10-CM | POA: Diagnosis not present

## 2022-09-08 LAB — RAD ONC ARIA SESSION SUMMARY
Course Elapsed Days: 11
Plan Fractions Treated to Date: 9
Plan Prescribed Dose Per Fraction: 3 Gy
Plan Total Fractions Prescribed: 10
Plan Total Prescribed Dose: 30 Gy
Reference Point Dosage Given to Date: 27 Gy
Reference Point Session Dosage Given: 3 Gy
Session Number: 9

## 2022-09-11 ENCOUNTER — Ambulatory Visit: Payer: Medicare HMO

## 2022-09-12 ENCOUNTER — Ambulatory Visit: Payer: Medicare HMO

## 2022-09-13 ENCOUNTER — Other Ambulatory Visit: Payer: Self-pay

## 2022-09-13 ENCOUNTER — Other Ambulatory Visit: Payer: Self-pay | Admitting: Nurse Practitioner

## 2022-09-13 ENCOUNTER — Ambulatory Visit
Admission: RE | Admit: 2022-09-13 | Discharge: 2022-09-13 | Disposition: A | Discharge status: Home / Self Care | Payer: Medicare HMO | Source: Ambulatory Visit | Attending: Radiation Oncology | Admitting: Radiation Oncology

## 2022-09-13 DIAGNOSIS — C7951 Secondary malignant neoplasm of bone: Secondary | ICD-10-CM

## 2022-09-13 DIAGNOSIS — Z510 Encounter for antineoplastic radiation therapy: Secondary | ICD-10-CM | POA: Diagnosis not present

## 2022-09-13 DIAGNOSIS — G893 Neoplasm related pain (acute) (chronic): Secondary | ICD-10-CM

## 2022-09-13 DIAGNOSIS — Z515 Encounter for palliative care: Secondary | ICD-10-CM

## 2022-09-13 LAB — RAD ONC ARIA SESSION SUMMARY
Course Elapsed Days: 16
Plan Fractions Treated to Date: 10
Plan Prescribed Dose Per Fraction: 3 Gy
Plan Total Fractions Prescribed: 10
Plan Total Prescribed Dose: 30 Gy
Reference Point Dosage Given to Date: 30 Gy
Reference Point Session Dosage Given: 3 Gy
Session Number: 10

## 2022-09-13 MED ORDER — HYDROMORPHONE HCL 2 MG PO TABS: 2.0000 mg | ORAL_TABLET | Freq: Four times a day (QID) | ORAL | 0 refills | Status: DC | PRN

## 2022-09-14 NOTE — Radiation Completion Notes (Addendum)
Radiation Oncology         [phone] ________________________________  Name: Terry Page. MRN: [id number]  Date: 09/13/2022  DOB: 1955-12-01  Referring Physician: Jeanie Sewer, M.D. Date of Service: 2022-09-14 Radiation Oncologist: Margaretmary Bayley, M.D. [facility]     RADIATION ONCOLOGY END OF TREATMENT NOTE     Diagnosis: 67 yo gentleman with metastatic lung cancer with painful osseous metastasis in the right pelvis.   Intent: Palliative     ==========DELIVERED PLANS==========  First Treatment Date: 2022-08-28 - Last Treatment Date: 2022-09-13   Plan Name: Pelv_R_Ilium Site: Ilium, Right Technique: 3D Mode: Photon Dose Per Fraction: 3 Gy Prescribed Dose (Delivered / Prescribed): 30 Gy / 30 Gy Prescribed Fxs (Delivered / Prescribed): 10 / 10     ==========ON TREATMENT VISIT DATES========== 2022-09-01, 2022-09-08   See weekly On Treatment Notes in Epic for details.  He tolerated the radiation treatments relatively well with only modest fatigue.  The patient will receive a call in about one month from the radiation oncology department. He will continue follow up with his medical oncologist, Dr. Leonides Schanz, as well.  ------------------------------------------------   Margaretmary Dys, MD [facility]  Radiation Oncology Direct Dial: [phone]  Fax: [phone] [email or web address]  Skype  [email or web address]

## 2022-09-18 ENCOUNTER — Ambulatory Visit: Payer: Medicare HMO | Admitting: Hematology and Oncology

## 2022-09-21 ENCOUNTER — Other Ambulatory Visit: Payer: Self-pay

## 2022-09-21 DIAGNOSIS — C3412 Malignant neoplasm of upper lobe, left bronchus or lung: Secondary | ICD-10-CM

## 2022-09-21 DIAGNOSIS — D649 Anemia, unspecified: Secondary | ICD-10-CM

## 2022-09-21 NOTE — Progress Notes (Signed)
Palliative Medicine [facility]  Telephone:(336) [phone] Fax:(336) [phone]   Name: Terry Page. Date: 09/21/2022 MRN: [id number]  DOB: [birth year]  Patient Care Team: Aliene Beams, MD as PCP - General (Family Medicine)   INTERVAL HISTORY: Terry Page. is a 67 y.o. 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 He is currently undergoing chemotherapy. Palliative ask to see for symptom management.  SOCIAL HISTORY:    Mr. Lachat reports that he quit smoking about 2 years ago. His smoking use included cigarettes. He has never used smokeless tobacco. He reports current alcohol use of about 12.0 standard drinks of alcohol per week. He reports that he does not use drugs.  ADVANCE DIRECTIVES:  None on file   CODE STATUS: Full Code  PAST MEDICAL HISTORY: Past Medical History:  Diagnosis Date   Alcohol abuse    Asthma    Bullous emphysema ([name])    Essential hypertension 04/10/2020   Hemorrhoids    History of radiation therapy 04/08/20-04/19/20   IMRT- Left lung- Dr. Antony Blackbird   Incidental lung nodule, greater than or equal to 8mm 10/22/2017   Left upper lobe - discovered on CTA   Prostate cancer ([name])    Spontaneous pneumothorax 10/20/2017   right   Tobacco abuse     ALLERGIES:  is allergic to morphine and oxycodone.  MEDICATIONS:  Current Outpatient Medications  Medication Sig Dispense Refill   acetaminophen (TYLENOL) 325 MG tablet Take 2 tablets (650 mg total) by mouth every 6 (six) hours as needed for mild pain or headache (fever >/= 101).     albuterol (VENTOLIN HFA) 108 (90 Base) MCG/ACT inhaler Inhale 2 puffs into the lungs every 6 (six) hours as needed for wheezing or shortness of breath. 8 g 1   amLODipine (NORVASC) 10 MG tablet Take 1 tablet by mouth daily.     atorvastatin (LIPITOR) 20  MG tablet Take 20 mg by mouth daily.     cyanocobalamin (VITAMIN B12) 1000 MCG tablet Take 1 tablet (1,000 mcg total) by mouth daily. 90 tablet 1   ferrous sulfate 325 (65 FE) MG EC tablet Take 1 tablet (325 mg total) by mouth daily with breakfast. 30 tablet 3   folic acid (FOLVITE) 1 MG tablet Take 1 tablet (1 mg total) by mouth daily. 30 tablet 3   HYDROmorphone (DILAUDID) 2 MG tablet Take 1 tablet (2 mg total) by mouth every 6 (six) hours as needed for severe pain. 60 tablet 0   hydrOXYzine (ATARAX) 25 MG tablet Take 1 tablet (25 mg total) by mouth at bedtime as needed for itching. 30 tablet 11   ipratropium-albuterol (DUONEB) 0.5-2.5 (3) MG/3ML SOLN Take 3 mLs by nebulization every 6 (six) hours as needed. (Patient taking differently: Take 3 mLs by nebulization every 6 (six) hours as needed (shortness of breathing or wheezing).) 360 mL 0   lidocaine (LIDODERM) 5 % Place 1 patch onto the skin daily. Remove & Discard patch within 12 hours or as directed by MD. Apply to low back 30 patch 0   lidocaine-prilocaine (EMLA) cream Apply 1 Application topically as needed. 30 g 0   losartan (COZAAR) 100 MG tablet Take 100 mg by mouth daily.     olmesartan (BENICAR) 40 MG tablet Take 40 mg by mouth daily.     ondansetron (ZOFRAN) 8 MG tablet Take 1  tablet (8 mg total) by mouth every 8 (eight) hours as needed. 30 tablet 0   polyethylene glycol (MIRALAX) 17 g packet Take 17 g by mouth daily. 30 each 2   potassium chloride SA (KLOR-CON M) 20 MEQ tablet TAKE 1 TABLET(20 MEQ) BY MOUTH DAILY 90 tablet 0   prochlorperazine (COMPAZINE) 10 MG tablet Take 1 tablet (10 mg total) by mouth every 6 (six) hours as needed for nausea or vomiting. 30 tablet 0   senna-docusate (STIMULANT LAXATIVE) 8.6-50 MG tablet Take 2 tablets by mouth 2 (two) times daily. May add additional tablet as needed 150 tablet 3   sodium chloride 1 g tablet Take 1 tablet (1 g total) by mouth 3 (three) times daily with meals. 90 tablet 0   thiamine  100 MG tablet Take 1 tablet (100 mg total) by mouth daily.     No current facility-administered medications for this visit.    VITAL SIGNS: There were no vitals taken for this visit. There were no vitals filed for this visit.  Estimated body mass index is 21.35 kg/m as calculated from the following:   Height as of 08/21/22: 5\' 9"  (1.753 m).   Weight as of 08/18/22: 144 lb 9.6 oz (65.6 kg).   PERFORMANCE STATUS (ECOG) : 1 - Symptomatic but completely ambulatory  Assessment NAD RRR Normal breathing pattern Dark areas to upper and lower extremities, scabbed areas AAO x4  IMPRESSION: Terry Page presented to clinic for follow-up. No acute distress. Denies nausea, vomiting, constipation, or diarrhea. Ambulatory with cane. States doing well overall. Appetite is good.   Neoplasm related pain  Terry Page unfortunately states he began developing itching and a rash with hydromorphone. He reports this did not happen with first round of medication only once he refilled medication. Some scabbed areas where he has been scratching. He discontinued use of hydromorphone. States symptoms have almost completely resolved. Rash is healing. Minimal itching. He has hydroxyzine as needed.   [name] states he restarted his Tramadol for pain. Reports back and leg pain are well controlled on current regimen of Tramadol 50-100 mg as needed. Tolerating without difficulty. Some days are better than others.   No changes at this time will continue with Tramadol. We will continue to closely monitor.   Constipation  Controlled with daily regimen.   We discussed the importance of continued conversation with family and their medical providers regarding overall plan of care and treatment options, ensuring decisions are within the context of the patients values and GOCs.   PLAN: Tramadol 50-100 mg every 6 hours as needed for pain. Senna-S daily for constipation. Miralax available if needed. Palliative will plan to see  patient back in 3-4 weeks in collaboration with his other oncology appointments. Will give him a call next week for close symptom follow-up.    Patient expressed understanding and was in agreement with this plan. He also understands that He can call the clinic at any time with any questions, concerns, or complaints.    Any controlled substances utilized were prescribed in the context of palliative care. PDMP has been reviewed.    Visit consisted of counseling and education dealing with the complex and emotionally intense issues of symptom management and palliative care in the setting of serious and potentially life-threatening illness.Greater than 50%  of this time was spent counseling and coordinating care related to the above assessment and plan.  [name], AGPCNP-BC  Palliative Medicine Team/[facility]  *Please note that this is a  verbal dictation therefore any spelling or grammatical errors are due to the "Dragon [employer] One" system interpretation.

## 2022-09-22 ENCOUNTER — Inpatient Hospital Stay (HOSPITAL_BASED_OUTPATIENT_CLINIC_OR_DEPARTMENT_OTHER): Payer: Medicare HMO | Admitting: Nurse Practitioner

## 2022-09-22 ENCOUNTER — Encounter: Payer: Self-pay | Admitting: Nurse Practitioner

## 2022-09-22 ENCOUNTER — Inpatient Hospital Stay: Payer: Medicare HMO

## 2022-09-22 ENCOUNTER — Inpatient Hospital Stay: Payer: Medicare HMO | Attending: Hematology and Oncology

## 2022-09-22 ENCOUNTER — Inpatient Hospital Stay (HOSPITAL_BASED_OUTPATIENT_CLINIC_OR_DEPARTMENT_OTHER): Payer: Medicare HMO | Admitting: Physician Assistant

## 2022-09-22 VITALS — BP 121/74 | HR 81 | Temp 98.1°F | Resp 16 | Ht 69.0 in | Wt 147.5 lb

## 2022-09-22 DIAGNOSIS — Z923 Personal history of irradiation: Secondary | ICD-10-CM | POA: Diagnosis not present

## 2022-09-22 DIAGNOSIS — I10 Essential (primary) hypertension: Secondary | ICD-10-CM | POA: Insufficient documentation

## 2022-09-22 DIAGNOSIS — R21 Rash and other nonspecific skin eruption: Secondary | ICD-10-CM | POA: Diagnosis not present

## 2022-09-22 DIAGNOSIS — G893 Neoplasm related pain (acute) (chronic): Secondary | ICD-10-CM

## 2022-09-22 DIAGNOSIS — Z87891 Personal history of nicotine dependence: Secondary | ICD-10-CM | POA: Insufficient documentation

## 2022-09-22 DIAGNOSIS — Z79891 Long term (current) use of opiate analgesic: Secondary | ICD-10-CM | POA: Diagnosis not present

## 2022-09-22 DIAGNOSIS — Z515 Encounter for palliative care: Secondary | ICD-10-CM

## 2022-09-22 DIAGNOSIS — C3490 Malignant neoplasm of unspecified part of unspecified bronchus or lung: Secondary | ICD-10-CM | POA: Diagnosis not present

## 2022-09-22 DIAGNOSIS — C3412 Malignant neoplasm of upper lobe, left bronchus or lung: Secondary | ICD-10-CM | POA: Diagnosis not present

## 2022-09-22 DIAGNOSIS — K5900 Constipation, unspecified: Secondary | ICD-10-CM | POA: Diagnosis not present

## 2022-09-22 DIAGNOSIS — C61 Malignant neoplasm of prostate: Secondary | ICD-10-CM | POA: Insufficient documentation

## 2022-09-22 DIAGNOSIS — C7951 Secondary malignant neoplasm of bone: Secondary | ICD-10-CM | POA: Insufficient documentation

## 2022-09-22 DIAGNOSIS — Z5112 Encounter for antineoplastic immunotherapy: Secondary | ICD-10-CM

## 2022-09-22 DIAGNOSIS — D6481 Anemia due to antineoplastic chemotherapy: Secondary | ICD-10-CM | POA: Insufficient documentation

## 2022-09-22 DIAGNOSIS — T451X5D Adverse effect of antineoplastic and immunosuppressive drugs, subsequent encounter: Secondary | ICD-10-CM | POA: Diagnosis not present

## 2022-09-22 DIAGNOSIS — Z95828 Presence of other vascular implants and grafts: Secondary | ICD-10-CM

## 2022-09-22 DIAGNOSIS — Z79620 Long term (current) use of immunosuppressive biologic: Secondary | ICD-10-CM | POA: Diagnosis not present

## 2022-09-22 LAB — CMP (CANCER CENTER ONLY)
ALT: 7 U/L (ref 0–44)
AST: 15 U/L (ref 15–41)
Albumin: 3.9 g/dL (ref 3.5–5.0)
Alkaline Phosphatase: 59 U/L (ref 38–126)
Anion gap: 7 (ref 5–15)
BUN: 10 mg/dL (ref 8–23)
CO2: 22 mmol/L (ref 22–32)
Calcium: 9.4 mg/dL (ref 8.9–10.3)
Chloride: 94 mmol/L — ABNORMAL LOW (ref 98–111)
Creatinine: 1.04 mg/dL (ref 0.61–1.24)
GFR, Estimated: >60 mL/min (ref >60)
Glucose, Bld: 81 mg/dL (ref 70–99)
Potassium: 4.7 mmol/L (ref 3.5–5.1)
Sodium: 123 mmol/L — ABNORMAL LOW (ref 135–145)
Total Bilirubin: 0.4 mg/dL (ref 0.3–1.2)
Total Protein: 8.2 g/dL — ABNORMAL HIGH (ref 6.5–8.1)

## 2022-09-22 LAB — TSH: TSH: 1.227 u[IU]/mL (ref 0.350–4.500)

## 2022-09-22 LAB — CBC WITH DIFFERENTIAL (CANCER CENTER ONLY)
Abs Immature Granulocytes: 0.09 10*3/uL — ABNORMAL HIGH (ref 0.00–0.07)
Basophils Absolute: 0 10*3/uL (ref 0.0–0.1)
Basophils Relative: 1 %
Eosinophils Absolute: 0.3 10*3/uL (ref 0.0–0.5)
Eosinophils Relative: 3 %
HCT: 29.9 % — ABNORMAL LOW (ref 39.0–52.0)
Hemoglobin: 10 g/dL — ABNORMAL LOW (ref 13.0–17.0)
Immature Granulocytes: 1 %
Lymphocytes Relative: 8 %
Lymphs Abs: 0.7 10*3/uL (ref 0.7–4.0)
MCH: 30.6 pg (ref 26.0–34.0)
MCHC: 33.4 g/dL (ref 30.0–36.0)
MCV: 91.4 fL (ref 80.0–100.0)
Monocytes Absolute: 0.9 10*3/uL (ref 0.1–1.0)
Monocytes Relative: 10 %
Neutro Abs: 6.6 10*3/uL (ref 1.7–7.7)
Neutrophils Relative %: 77 %
Platelet Count: 274 10*3/uL (ref 150–400)
RBC: 3.27 MIL/uL — ABNORMAL LOW (ref 4.22–5.81)
RDW: 13.9 % (ref 11.5–15.5)
WBC Count: 8.5 10*3/uL (ref 4.0–10.5)
nRBC: 0 % (ref 0.0–0.2)

## 2022-09-22 MED ORDER — SODIUM CHLORIDE 0.9 % IV SOLN: Freq: Once | INTRAVENOUS | Status: AC

## 2022-09-22 MED ORDER — SODIUM CHLORIDE 0.9% FLUSH
10.0000 mL | Freq: Once | INTRAVENOUS | Status: AC
  Administered 2022-09-22: 10 mL

## 2022-09-22 MED ORDER — HEPARIN SOD (PORK) LOCK FLUSH 100 UNIT/ML IV SOLN
500.0000 [IU] | Freq: Once | INTRAVENOUS | Status: AC | PRN
  Administered 2022-09-22: 500 [IU]

## 2022-09-22 MED ORDER — SODIUM CHLORIDE 0.9% FLUSH
10.0000 mL | INTRAVENOUS | Status: DC | PRN
  Administered 2022-09-22: 10 mL

## 2022-09-22 MED ORDER — SODIUM CHLORIDE 0.9 % IV SOLN
200.0000 mg | Freq: Once | INTRAVENOUS | Status: AC
  Administered 2022-09-22: 200 mg via INTRAVENOUS
  Filled 2022-09-22: qty 200

## 2022-09-22 NOTE — Progress Notes (Signed)
[facility] Telephone:(336) [phone]   [phone]) [phone]  PROGRESS NOTE  Patient Care Team: Aliene Beams, MD as PCP - General (Family Medicine)  Hematological/Oncological History # Metastatic Adenocarcinoma of the Lung  01/07/2020 : CT of the chest with contrast performed which showed interval enlargement of a spiculated nodule in the central left upper lobe measuring 1.2 x 1.0 cm and highly suspicious for primary lung malignancy 02/06/2020 :PET scan performed which showed a hypermetabolic irregular solid 1.3 cm left upper lobe pulmonary nodule compatible with malignancy without any other hypermetabolic lesions 04/06/4096- 04/19/2020:  SBRT to the lung lesion 04/28/2021: CT C/A/P showed multifocal lytic bone metastases are identified. Lesion within the spine of the left scapula and lesions involving bilateral iliac bones and left sacral wing. Establish care with Dr. Leonides Schanz  05/03/2021: biopsy of right iliac lytic lesion showed metastatic carcinoma, consistent with lung primary.  07/08/2021: Cycle 1 Day 1 of Carbo/Pem/Pem 07/29/2021: Cycle 2 Day 1 of Carbo/Pem/Pem 08/11/2021-08/24/2021: Received palliative radiation to osseous metastasis in the left shoulder. 30 Gy in 10 fractions.  08/19/2021: Cycle 3 Day 1 of Carbo/Pem/Pem 09/09/2021: Cycle 4 Day 1 of Carbo/Pem/Pem 09/23/2021: CT CAP: stable disease 09/30/2021: Cycle 5 Day 1 of Pem/Pem 10/21/2021: Cycle 6 Day 1 of Pembrolizumab. Held pemetrexed due to anemia. 11/16/2021: Cycle 7 Day 1 of Pem/Pem.   12/16/2021: Cycle 8 Day 1 of Pem/Pem.   01/06/2022: Cycle 9 Day 1 of Pem/Pem.  01/27/2022: Cycle 10 Day 1 of Pem/Pem 02/17/2022: Cycle 11 Day 1 of Pem/Pem 03/10/2022: Cycle 12 Day 1 of Pem/Pem (HELD due to anemia with Hgb of 6.4) 03/17/2022: Cycle 12 Day 1 of Pem/Pem (HELD pemextrexed due to cytopenias/kidney function) 04/10/2022: Cycle 13 Day 1 of Pem/Pem (HELD pemextrexed due to cytopenias/kidney function) 04/28/2022: Cycle 14 Day 1 of Pem/Pem  (HELD pemextrexed due to cytopenias/kidney function) 05/23/2022:  Cycle 15 Day 1 of Pem/Pem (HELD pemextrexed due to cytopenias/kidney function) 06/15/2022: Cycle 16 Day 1 of Pembrolizumab.  07/07/2022: Cycle 17 Day 1 of Pembrolizumab.  07/28/2022: Cycle 18 Day 1 of Pembrolizumab 08/18/2022: Cycle 19 Day 1 of Pembrolizumab HELD due to hyponatremia of 118, new bone metastases. 08/25/2022: Cycle 19 Day 1 of Pembrolizumab 08/28/2022-09/13/2022: Received palliative radiation to right pelvis with total dose of 30 Gy in 10 Fx.  09/22/2022:  Cycle 20 Day 1 of Pembrolizumab  #Adenocarcinoma of the Prostate, [id number] 08/19/2019-10/07/2019: 70 Gy in 28 fractions of 2.5 Gy.  Radiation to the prostate was under the care of Dr. Margaretmary Dys  Interval History:  Terry Page. 67 y.o. male with medical history significant for metastatic adenocarcinoma of the lung who presents for a follow up visit. The patient's last visit was on 08/25/2022. In the interim since the last visit he completed Cycle 19 with single agent pembrolizumab and completed palliative radiation to the right pelvis. He is unaccompanied for this visit.   On exam today, Terry Page reports the pain is slightly better in the right hip but he needs to take tramadol for pain relief.  He reports needing a cane to help with ambulation.  His energy levels are overall stable.  He denies nausea, vomiting or any bowel habit changes.  He denies easy bruising or signs of active bleeding. He denies fevers, chills, sweats, shortness of breath, chest pain, cough, headaches or dizziness. He has no other complaints. A full 10 point ROS is listed below.  MEDICAL HISTORY:  Past Medical History:  Diagnosis Date   Alcohol abuse  Asthma    Bullous emphysema ([name])    Essential hypertension 04/10/2020   Hemorrhoids    History of radiation therapy 04/08/20-04/19/20   IMRT- Left lung- Dr. Antony Blackbird   Incidental lung nodule, greater than or equal to 8mm 10/22/2017    Left upper lobe - discovered on CTA   Prostate cancer ([name])    Spontaneous pneumothorax 10/20/2017   right   Tobacco abuse     SURGICAL HISTORY: Past Surgical History:  Procedure Laterality Date   BACK SURGERY     IR IMAGING GUIDED PORT INSERTION  06/29/2021   PROSTATE BIOPSY      SOCIAL HISTORY: Social History   Socioeconomic History   Marital status: Single    Spouse name: Not on file   Number of children: Not on file   Years of education: Not on file   Highest education level: Not on file  Occupational History   Occupation: retired  Tobacco Use   Smoking status: Former    Current packs/day: 0.00    Types: Cigarettes    Quit date: 11/22/2019    Years since quitting: 2.8   Smokeless tobacco: Never   Tobacco comments:    Patient reports quit 3 years ago. 06/25/20. HSM  Vaping Use   Vaping status: Never Used  Substance and Sexual Activity   Alcohol use: Yes    Alcohol/week: 12.0 standard drinks of alcohol    Types: 12 Cans of beer per week    Comment: daily sometimes   Drug use: No   Sexual activity: Not Currently  Other Topics Concern   Not on file  Social History Narrative   Not on file   Social Determinants of Health   Financial Resource Strain: Not on file  Food Insecurity: Not on file  Transportation Needs: Unmet Transportation Needs (08/18/2022)   PRAPARE - Administrator, Civil Service (Medical): Yes    Lack of Transportation (Non-Medical): No  Physical Activity: Not on file  Stress: Not on file  Social Connections: Not on file  Intimate Partner Violence: Not on file    FAMILY HISTORY: Family History  Problem Relation Age of Onset   Cancer Cousin        maternal cousin   Cancer Cousin        paternal cousin   Cancer Cousin    Colon polyps Neg Hx    Pancreatic disease Neg Hx    Pancreatic cancer Neg Hx    Breast cancer Neg Hx    Colon cancer Neg Hx     ALLERGIES:  is allergic to morphine and oxycodone.  MEDICATIONS:   Current Outpatient Medications  Medication Sig Dispense Refill   acetaminophen (TYLENOL) 325 MG tablet Take 2 tablets (650 mg total) by mouth every 6 (six) hours as needed for mild pain or headache (fever >/= 101).     albuterol (VENTOLIN HFA) 108 (90 Base) MCG/ACT inhaler Inhale 2 puffs into the lungs every 6 (six) hours as needed for wheezing or shortness of breath. 8 g 1   amLODipine (NORVASC) 10 MG tablet Take 1 tablet by mouth daily.     atorvastatin (LIPITOR) 20 MG tablet Take 20 mg by mouth daily.     cyanocobalamin (VITAMIN B12) 1000 MCG tablet Take 1 tablet (1,000 mcg total) by mouth daily. 90 tablet 1   ferrous sulfate 325 (65 FE) MG EC tablet Take 1 tablet (325 mg total) by mouth daily with breakfast. 30 tablet 3   folic acid (  FOLVITE) 1 MG tablet Take 1 tablet (1 mg total) by mouth daily. 30 tablet 3   HYDROmorphone (DILAUDID) 2 MG tablet Take 1 tablet (2 mg total) by mouth every 6 (six) hours as needed for severe pain. (Patient not taking: Reported on 09/22/2022) 60 tablet 0   hydrOXYzine (ATARAX) 25 MG tablet Take 1 tablet (25 mg total) by mouth at bedtime as needed for itching. 30 tablet 11   ipratropium-albuterol (DUONEB) 0.5-2.5 (3) MG/3ML SOLN Take 3 mLs by nebulization every 6 (six) hours as needed. (Patient taking differently: Take 3 mLs by nebulization every 6 (six) hours as needed (shortness of breathing or wheezing).) 360 mL 0   lidocaine (LIDODERM) 5 % Place 1 patch onto the skin daily. Remove & Discard patch within 12 hours or as directed by MD. Apply to low back 30 patch 0   lidocaine-prilocaine (EMLA) cream Apply 1 Application topically as needed. 30 g 0   losartan (COZAAR) 100 MG tablet Take 100 mg by mouth daily.     olmesartan (BENICAR) 40 MG tablet Take 40 mg by mouth daily.     ondansetron (ZOFRAN) 8 MG tablet Take 1 tablet (8 mg total) by mouth every 8 (eight) hours as needed. 30 tablet 0   polyethylene glycol (MIRALAX) 17 g packet Take 17 g by mouth daily. 30 each  2   potassium chloride SA (KLOR-CON M) 20 MEQ tablet TAKE 1 TABLET(20 MEQ) BY MOUTH DAILY 90 tablet 0   prochlorperazine (COMPAZINE) 10 MG tablet Take 1 tablet (10 mg total) by mouth every 6 (six) hours as needed for nausea or vomiting. 30 tablet 0   senna-docusate (STIMULANT LAXATIVE) 8.6-50 MG tablet Take 2 tablets by mouth 2 (two) times daily. May add additional tablet as needed 150 tablet 3   sodium chloride 1 g tablet Take 1 tablet (1 g total) by mouth 3 (three) times daily with meals. 90 tablet 0   thiamine 100 MG tablet Take 1 tablet (100 mg total) by mouth daily.     No current facility-administered medications for this visit.   Facility-Administered Medications Ordered in Other Visits  Medication Dose Route Frequency Provider Last Rate Last Admin   sodium chloride flush (NS) 0.9 % injection 10 mL  10 mL Intracatheter PRN Jaci Standard, MD   10 mL at 09/22/22 1452    REVIEW OF SYSTEMS:   Constitutional: ( - ) fevers, ( - )  chills , ( - ) night sweats Eyes: ( - ) blurriness of vision, ( - ) double vision, ( - ) watery eyes Ears, nose, mouth, throat, and face: ( - ) mucositis, ( - ) sore throat Respiratory: ( - ) cough, ( - ) dyspnea, ( - ) wheezes Cardiovascular: ( - ) palpitation, ( - ) chest discomfort, ( - ) lower extremity swelling Gastrointestinal:  ( - ) nausea, ( - ) heartburn, ( - ) change in bowel habits Skin: ( - ) abnormal skin rashes Lymphatics: ( - ) new lymphadenopathy, ( - ) easy bruising Neurological: ( - ) numbness, ( - ) tingling, ( - ) new weaknesses Behavioral/Psych: ( - ) mood change, ( - ) new changes  All other systems were reviewed with the patient and are negative.  PHYSICAL EXAMINATION: ECOG PERFORMANCE STATUS: 1 - Symptomatic but completely ambulatory  Vitals:   09/22/22 1258  BP: 121/74  Pulse: 81  Resp: 16  Temp: 98.1 F (36.7 C)  SpO2: 98%     Filed  Weights   09/22/22 1258  Weight: 147 lb 8 oz (66.9 kg)      GENERAL:  Well-appearing elderly African-American male, alert, no distress and comfortable SKIN: skin color, texture, turgor are normal, no rashes or significant lesions EYES: conjunctiva are pink and non-injected, sclera clear LUNGS: clear to auscultation and percussion with normal breathing effort HEART: regular rate & rhythm and no murmurs and no lower extremity edema Musculoskeletal: no cyanosis of digits and no clubbing  PSYCH: alert & oriented x 3, fluent speech NEURO: no focal motor/sensory deficits  LABORATORY DATA:  I have reviewed the data as listed    Latest Ref Rng & Units 09/22/2022   12:01 PM 08/25/2022    2:00 PM 08/21/2022    3:10 PM  CBC  WBC 4.0 - 10.5 K/uL 8.5  11.1  11.4   Hemoglobin 13.0 - 17.0 g/dL 78.4  69.6  29.5   Hematocrit 39.0 - 52.0 % 29.9  31.4  33.9   Platelets 150 - 400 K/uL 274  279  289        Latest Ref Rng & Units 09/22/2022   12:01 PM 08/25/2022    2:00 PM 08/21/2022    3:10 PM  CMP  Glucose 70 - 99 mg/dL 81  82  284   BUN 8 - 23 mg/dL 10  9  14    Creatinine 0.61 - 1.24 mg/dL 1.32  4.40  1.02   Sodium 135 - 145 mmol/L 123  122  120   Potassium 3.5 - 5.1 mmol/L 4.7  4.6  4.3   Chloride 98 - 111 mmol/L 94  93  88   CO2 22 - 32 mmol/L 22  22  22    Calcium 8.9 - 10.3 mg/dL 9.4  9.4  9.6   Total Protein 6.5 - 8.1 g/dL 8.2  8.4  9.3   Total Bilirubin 0.3 - 1.2 mg/dL 0.4  0.3  0.4   Alkaline Phos 38 - 126 U/L 59  83  86   AST 15 - 41 U/L 15  16  21    ALT 0 - 44 U/L 7  10  11      Lab Results  Component Value Date   MPROTEIN Not Observed 04/28/2021   Lab Results  Component Value Date   KPAFRELGTCHN 33.6 (H) 04/28/2021   LAMBDASER 22.0 04/28/2021   KAPLAMBRATIO 1.53 04/28/2021    RADIOGRAPHIC STUDIES: No results found.  ASSESSMENT & PLAN Smitty Colan. Is a 67 y.o.  male with medical history significant for metastatic adenocarcinoma of the lung who presents for a follow up visit.  # Metastatic Adenocarcinoma of the Lung  -- NGS testing  from this patient shows no evidence of targetable mutation. --MRI brain shows no evidence of intracranial metastases --Started carboplatin, pembrolizumab, and pemetrexed as treatment for his cancer on 07/08/2021. --Received palliative radiation to osseous metastasis of the left shoulder from 08/11/2021-08/24/2021. Planned dose is 30 Gy in 10 fx.  --Discontinued Pemetrexed on 03/17/2022 due to persistent anemia and kidney dysfunction. --Restaging CT CAP from 08/09/2022 shows interval enlargement of multiple mixed lytic and sclerotic bone metastases involving the right pelvis. Other bone metastases including of the left scapula, sacrum, and left hemipelvis are unchanged. Unchanged treated central LUL mass. Recommend to continue on pembrolizumab therapy and arrange for palliative radiation to enlarging bone metastases.  Plan: --today is Day 1 Cycle 20 of Pembrolizumab today --labs from today were reviewed. Labs show white blood cell count 8.5, hemoglobin 10.0, MCV  91.4, and platelets of 274. Creatinine and LFTs normal.  --Proceed with treatment without any dose modifications.  --Plan for repeat CT imaging around November 2024.  --RTC in 3 week with labs, follow up visit before Cycle 21.   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  --Pain is slightly improved but still present.  --Continue to take tramadol q 6 hours for pain control.   #Hyponatremia: --Chronic in nature --Sodium level dropped from 127 on 08/09/2022 to 118.  Today sodium improved to 123 --Etiologies including SIADH, immunotherapy induced, too much water intake.  --Patient is asymptomatic without nausea/vomiting, headaches, confusion, seizures, etc --Currently on salt tablets 2 gm PO daily. We advised to titrate to twice daily x 7 days and then three times daily.   --Strict ED precautions given for any new symptoms as discussed above.   # Iron Deficiency Anemia #Chemotherapy induced anemia --Hgb level 10.0 today.  --Currently on ferrous sulfate 325 mg PO daily  #Pain Control --stopped oxycodone due to itching --currently following with Palliative care.  --pain well controlled with tramadol as needed. Continue to take tramdol 50-100 mg q 6 hours as needed. --Continue senna docusate for constipation prophylaxis --Continue to monitor  #Supportive Care -- chemotherapy education complete -- port placed -- zofran 8mg  q8H PRN and compazine 10mg  PO q6H for nausea -- EMLA cream for port -- Patient referred to palliative care for pain control. Pain control as above.    No orders of the defined types were placed in this encounter.   All questions were answered. The patient knows to call the clinic with any problems, questions or concerns.  I have spent a total of 30 minutes minutes of face-to-face and non-face-to-face time, preparing to see the patient, performing a medically appropriate examination, counseling and educating the patient, referring and communicating with other health care professionals, documenting clinical information in the electronic health record, and care coordination.   Georga Kaufmann PA-C Dept of Hematology and Oncology [facility] Phone: [phone]    09/22/2022 4:37 PM

## 2022-09-22 NOTE — Patient Instructions (Signed)
Terry Page  Discharge Instructions: Thank you for choosing [facility] to provide your oncology and hematology care.   If you have a lab appointment with the [facility], please go directly to the [facility] and check in at the registration area.   Wear comfortable clothing and clothing appropriate for easy access to any Portacath or PICC line.   We strive to give you quality time with your provider. You may need to reschedule your appointment if you arrive late (15 or more minutes).  Arriving late affects you and other patients whose appointments are after yours.  Also, if you miss three or more appointments without notifying the office, you may be dismissed from the clinic at the provider's discretion.      For prescription refill requests, have your pharmacy contact our office and allow 72 hours for refills to be completed.    Today you received the following chemotherapy and/or immunotherapy agents: Keytruda      To help prevent nausea and vomiting after your treatment, we encourage you to take your nausea medication as directed.  BELOW ARE SYMPTOMS THAT SHOULD BE REPORTED IMMEDIATELY: *FEVER GREATER THAN 100.4 F (38 C) OR HIGHER *CHILLS OR SWEATING *NAUSEA AND VOMITING THAT IS NOT CONTROLLED WITH YOUR NAUSEA MEDICATION *UNUSUAL SHORTNESS OF BREATH *UNUSUAL BRUISING OR BLEEDING *URINARY PROBLEMS (pain or burning when urinating, or frequent urination) *BOWEL PROBLEMS (unusual diarrhea, constipation, pain near the anus) TENDERNESS IN MOUTH AND THROAT WITH OR WITHOUT PRESENCE OF ULCERS (sore throat, sores in mouth, or a toothache) UNUSUAL RASH, SWELLING OR PAIN  UNUSUAL VAGINAL DISCHARGE OR ITCHING   Items with * indicate a potential emergency and should be followed up as soon as possible or go to the Emergency Department if any problems should occur.  Please show the CHEMOTHERAPY ALERT CARD or IMMUNOTHERAPY ALERT CARD at  check-in to the Emergency Department and triage nurse.  Should you have questions after your visit or need to cancel or reschedule your appointment, please contact [facility]  Dept: [phone]  and follow the prompts.  Office hours are 8:00 a.m. to 4:30 p.m. Monday - Friday. Please note that voicemails left after 4:00 p.m. may not be returned until the following business day.  We are closed weekends and major holidays. You have access to a nurse at all times for urgent questions. Please call the main number to the clinic Dept: [phone] and follow the prompts.   For any non-urgent questions, you may also contact your provider using MyChart. We now offer e-Visits for anyone 14 and older to request care online for non-urgent symptoms. For details visit [email or web address].   Also download the MyChart app! Go to the app store, search "MyChart", open the app, select [facility], and log in with your MyChart username and password.

## 2022-09-22 NOTE — Patient Instructions (Signed)

## 2022-09-23 LAB — T4: T4, Total: 6 ug/dL (ref 4.5–12.0)

## 2022-09-24 ENCOUNTER — Encounter: Payer: Self-pay | Admitting: Hematology and Oncology

## 2022-09-25 ENCOUNTER — Other Ambulatory Visit: Payer: Self-pay

## 2022-09-25 MED ORDER — TRAMADOL HCL 50 MG PO TABS
50.0000 mg | ORAL_TABLET | Freq: Four times a day (QID) | ORAL | 0 refills | Status: DC | PRN
Start: 2022-09-25 — End: 2022-11-02

## 2022-10-13 ENCOUNTER — Inpatient Hospital Stay: Payer: Medicare HMO | Attending: Hematology and Oncology

## 2022-10-13 ENCOUNTER — Inpatient Hospital Stay: Payer: Medicare HMO

## 2022-10-13 ENCOUNTER — Inpatient Hospital Stay: Payer: Medicare HMO | Admitting: Hematology and Oncology

## 2022-10-13 ENCOUNTER — Other Ambulatory Visit: Payer: Self-pay

## 2022-10-13 ENCOUNTER — Inpatient Hospital Stay: Payer: Medicare HMO | Admitting: Dietician

## 2022-10-13 VITALS — BP 111/74 | HR 85

## 2022-10-13 VITALS — BP 131/76 | HR 78 | Temp 97.8°F | Resp 14 | Wt 144.7 lb

## 2022-10-13 DIAGNOSIS — C7951 Secondary malignant neoplasm of bone: Secondary | ICD-10-CM

## 2022-10-13 DIAGNOSIS — E871 Hypo-osmolality and hyponatremia: Secondary | ICD-10-CM | POA: Insufficient documentation

## 2022-10-13 DIAGNOSIS — G893 Neoplasm related pain (acute) (chronic): Secondary | ICD-10-CM | POA: Diagnosis not present

## 2022-10-13 DIAGNOSIS — Z79891 Long term (current) use of opiate analgesic: Secondary | ICD-10-CM | POA: Diagnosis not present

## 2022-10-13 DIAGNOSIS — Z5112 Encounter for antineoplastic immunotherapy: Secondary | ICD-10-CM | POA: Diagnosis not present

## 2022-10-13 DIAGNOSIS — C3490 Malignant neoplasm of unspecified part of unspecified bronchus or lung: Secondary | ICD-10-CM

## 2022-10-13 DIAGNOSIS — I10 Essential (primary) hypertension: Secondary | ICD-10-CM | POA: Insufficient documentation

## 2022-10-13 DIAGNOSIS — Z923 Personal history of irradiation: Secondary | ICD-10-CM | POA: Insufficient documentation

## 2022-10-13 DIAGNOSIS — C61 Malignant neoplasm of prostate: Secondary | ICD-10-CM | POA: Diagnosis not present

## 2022-10-13 DIAGNOSIS — C3412 Malignant neoplasm of upper lobe, left bronchus or lung: Secondary | ICD-10-CM

## 2022-10-13 DIAGNOSIS — D6481 Anemia due to antineoplastic chemotherapy: Secondary | ICD-10-CM | POA: Insufficient documentation

## 2022-10-13 DIAGNOSIS — Z87891 Personal history of nicotine dependence: Secondary | ICD-10-CM | POA: Insufficient documentation

## 2022-10-13 DIAGNOSIS — Z79620 Long term (current) use of immunosuppressive biologic: Secondary | ICD-10-CM | POA: Diagnosis not present

## 2022-10-13 DIAGNOSIS — T451X5D Adverse effect of antineoplastic and immunosuppressive drugs, subsequent encounter: Secondary | ICD-10-CM | POA: Insufficient documentation

## 2022-10-13 DIAGNOSIS — D649 Anemia, unspecified: Secondary | ICD-10-CM

## 2022-10-13 LAB — CBC WITH DIFFERENTIAL (CANCER CENTER ONLY)
Abs Immature Granulocytes: 0.07 10*3/uL (ref 0.00–0.07)
Basophils Absolute: 0.1 10*3/uL (ref 0.0–0.1)
Basophils Relative: 1 %
Eosinophils Absolute: 0.3 10*3/uL (ref 0.0–0.5)
Eosinophils Relative: 3 %
HCT: 32.7 % — ABNORMAL LOW (ref 39.0–52.0)
Hemoglobin: 11 g/dL — ABNORMAL LOW (ref 13.0–17.0)
Immature Granulocytes: 1 %
Lymphocytes Relative: 8 %
Lymphs Abs: 0.8 10*3/uL (ref 0.7–4.0)
MCH: 30.9 pg (ref 26.0–34.0)
MCHC: 33.6 g/dL (ref 30.0–36.0)
MCV: 91.9 fL (ref 80.0–100.0)
Monocytes Absolute: 1 10*3/uL (ref 0.1–1.0)
Monocytes Relative: 10 %
Neutro Abs: 7.7 10*3/uL (ref 1.7–7.7)
Neutrophils Relative %: 77 %
Platelet Count: 419 10*3/uL — ABNORMAL HIGH (ref 150–400)
RBC: 3.56 MIL/uL — ABNORMAL LOW (ref 4.22–5.81)
RDW: 14.3 % (ref 11.5–15.5)
WBC Count: 10 10*3/uL (ref 4.0–10.5)
nRBC: 0 % (ref 0.0–0.2)

## 2022-10-13 LAB — CMP (CANCER CENTER ONLY)
ALT: 10 U/L (ref 0–44)
AST: 15 U/L (ref 15–41)
Albumin: 4 g/dL (ref 3.5–5.0)
Alkaline Phosphatase: 65 U/L (ref 38–126)
Anion gap: 7 (ref 5–15)
BUN: 10 mg/dL (ref 8–23)
CO2: 24 mmol/L (ref 22–32)
Calcium: 9.9 mg/dL (ref 8.9–10.3)
Chloride: 97 mmol/L — ABNORMAL LOW (ref 98–111)
Creatinine: 0.95 mg/dL (ref 0.61–1.24)
GFR, Estimated: >60 mL/min (ref >60)
Glucose, Bld: 95 mg/dL (ref 70–99)
Potassium: 4.3 mmol/L (ref 3.5–5.1)
Sodium: 128 mmol/L — ABNORMAL LOW (ref 135–145)
Total Bilirubin: 0.4 mg/dL (ref 0.3–1.2)
Total Protein: 8.3 g/dL — ABNORMAL HIGH (ref 6.5–8.1)

## 2022-10-13 LAB — TSH: TSH: 1.633 u[IU]/mL (ref 0.350–4.500)

## 2022-10-13 MED ORDER — SODIUM CHLORIDE 0.9% FLUSH
10.0000 mL | INTRAVENOUS | Status: DC | PRN
  Administered 2022-10-13: 10 mL

## 2022-10-13 MED ORDER — SODIUM CHLORIDE 0.9 % IV SOLN: Freq: Once | INTRAVENOUS | Status: AC

## 2022-10-13 MED ORDER — HEPARIN SOD (PORK) LOCK FLUSH 100 UNIT/ML IV SOLN
500.0000 [IU] | Freq: Once | INTRAVENOUS | Status: AC | PRN
  Administered 2022-10-13: 500 [IU]

## 2022-10-13 MED ORDER — SODIUM CHLORIDE 0.9 % IV SOLN
200.0000 mg | Freq: Once | INTRAVENOUS | Status: AC
  Administered 2022-10-13: 200 mg via INTRAVENOUS
  Filled 2022-10-13: qty 200

## 2022-10-13 NOTE — Progress Notes (Signed)
[facility] Telephone:([phone]   [phone]) [phone]  PROGRESS NOTE  Patient Care Team: Aliene Beams, MD as PCP - General (Family Medicine)  Hematological/Oncological History # Metastatic Adenocarcinoma of the Lung  01/07/2020 : CT of the chest with contrast performed which showed interval enlargement of a spiculated nodule in the central left upper lobe measuring 1.2 x 1.0 cm and highly suspicious for primary lung malignancy 02/06/2020 :PET scan performed which showed a hypermetabolic irregular solid 1.3 cm left upper lobe pulmonary nodule compatible with malignancy without any other hypermetabolic lesions 04/06/4096- 04/19/2020:  SBRT to the lung lesion 04/28/2021: CT C/A/P showed multifocal lytic bone metastases are identified. Lesion within the spine of the left scapula and lesions involving bilateral iliac bones and left sacral wing. Establish care with Dr. Leonides Schanz  05/03/2021: biopsy of right iliac lytic lesion showed metastatic carcinoma, consistent with lung primary.  07/08/2021: Cycle 1 Day 1 of Carbo/Pem/Pem 07/29/2021: Cycle 2 Day 1 of Carbo/Pem/Pem 08/11/2021-08/24/2021: Received palliative radiation to osseous metastasis in the left shoulder. 30 Gy in 10 fractions.  08/19/2021: Cycle 3 Day 1 of Carbo/Pem/Pem 09/09/2021: Cycle 4 Day 1 of Carbo/Pem/Pem 09/23/2021: CT CAP: stable disease 09/30/2021: Cycle 5 Day 1 of Pem/Pem 10/21/2021: Cycle 6 Day 1 of Pembrolizumab. Held pemetrexed due to anemia. 11/16/2021: Cycle 7 Day 1 of Pem/Pem.   12/16/2021: Cycle 8 Day 1 of Pem/Pem.   01/06/2022: Cycle 9 Day 1 of Pem/Pem.  01/27/2022: Cycle 10 Day 1 of Pem/Pem 02/17/2022: Cycle 11 Day 1 of Pem/Pem 03/10/2022: Cycle 12 Day 1 of Pem/Pem (HELD due to anemia with Hgb of 6.4) 03/17/2022: Cycle 12 Day 1 of Pem/Pem (HELD pemextrexed due to cytopenias/kidney function) 04/10/2022: Cycle 13 Day 1 of Pem/Pem (HELD pemextrexed due to cytopenias/kidney function) 04/28/2022: Cycle 14 Day 1 of Pem/Pem  (HELD pemextrexed due to cytopenias/kidney function) 05/23/2022:  Cycle 15 Day 1 of Pem/Pem (HELD pemextrexed due to cytopenias/kidney function) 06/15/2022: Cycle 16 Day 1 of Pembrolizumab.  07/07/2022: Cycle 17 Day 1 of Pembrolizumab.  07/28/2022: Cycle 18 Day 1 of Pembrolizumab 08/18/2022: Cycle 19 Day 1 of Pembrolizumab HELD due to hyponatremia of 118, new bone metastases. 08/25/2022: Cycle 19 Day 1 of Pembrolizumab 08/28/2022-09/13/2022: Received palliative radiation to right pelvis with total dose of 30 Gy in 10 Fx.  09/22/2022:  Cycle 20 Day 1 of Pembrolizumab 10/13/2022:  Cycle 21 Day 1 of Pembrolizumab  #Adenocarcinoma of the Prostate, [id number] 08/19/2019-10/07/2019: 70 Gy in 28 fractions of 2.5 Gy.  Radiation to the prostate was under the care of Dr. Margaretmary Dys  Interval History:  [name]. 67 y.o. male with medical history significant for metastatic adenocarcinoma of the lung who presents for a follow up visit. The patient's last visit was on 09/22/2022. In the interim since the last visit he completed Cycle 20 with single agent pembrolizumab. He is unaccompanied for this visit.   On exam today, Mr. Hippe reports the pain in his back and hip are "constant".  He does feel like the radiation did help and the pain is subsiding down to about a 6 out of 10.  He is taking tramadol, approximately 3 pills/day at 3 separate times.  He is taking Tylenol in between.  He had poor response/reactions to morphine.  He reports that he is not having any issues with constipation and denies any nausea, vomiting, or diarrhea.  He is not having any shortness of breath, runny nose, cough, or sore throat.  He reports his appetite is good  though he has lost 3 pounds in the interim since her last visit.  His appetite has been stable.  He is taking about 4 salt pills per day, 2 at a time.  He is not having any lightheadedness, dizziness, or shortness of breath.  He denies fevers, chills, sweats, shortness of  breath, chest pain, cough, headaches or dizziness. He has no other complaints. A full 10 point ROS is listed below.  MEDICAL HISTORY:  Past Medical History:  Diagnosis Date   Alcohol abuse    Asthma    Bullous emphysema ([name])    Essential hypertension 04/10/2020   Hemorrhoids    History of radiation therapy 04/08/20-04/19/20   IMRT- Left lung- Dr. Antony Blackbird   Incidental lung nodule, greater than or equal to 8mm 10/22/2017   Left upper lobe - discovered on CTA   Prostate cancer ([name])    Spontaneous pneumothorax 10/20/2017   right   Tobacco abuse     SURGICAL HISTORY: Past Surgical History:  Procedure Laterality Date   BACK SURGERY     IR IMAGING GUIDED PORT INSERTION  06/29/2021   PROSTATE BIOPSY      SOCIAL HISTORY: Social History   Socioeconomic History   Marital status: Single    Spouse name: Not on file   Number of children: Not on file   Years of education: Not on file   Highest education level: Not on file  Occupational History   Occupation: retired  Tobacco Use   Smoking status: Former    Current packs/day: 0.00    Types: Cigarettes    Quit date: 11/22/2019    Years since quitting: 2.8   Smokeless tobacco: Never   Tobacco comments:    Patient reports quit 3 years ago. 06/25/20. [facility]  Vaping Use   Vaping status: Never Used  Substance and Sexual Activity   Alcohol use: Yes    Alcohol/week: 12.0 standard drinks of alcohol    Types: 12 Cans of beer per week    Comment: daily sometimes   Drug use: No   Sexual activity: Not Currently  Other Topics Concern   Not on file  Social History Narrative   Not on file   Social Determinants of Health   Financial Resource Strain: Not on file  Food Insecurity: Not on file  Transportation Needs: Unmet Transportation Needs (08/18/2022)   PRAPARE - Administrator, Civil Service (Medical): Yes    Lack of Transportation (Non-Medical): No  Physical Activity: Not on file  Stress: Not on file  Social  Connections: Not on file  Intimate Partner Violence: Not on file    FAMILY HISTORY: Family History  Problem Relation Age of Onset   Cancer Cousin        maternal cousin   Cancer Cousin        paternal cousin   Cancer Cousin    Colon polyps Neg Hx    Pancreatic disease Neg Hx    Pancreatic cancer Neg Hx    Breast cancer Neg Hx    Colon cancer Neg Hx     ALLERGIES:  is allergic to morphine and oxycodone.  MEDICATIONS:  Current Outpatient Medications  Medication Sig Dispense Refill   acetaminophen (TYLENOL) 325 MG tablet Take 2 tablets (650 mg total) by mouth every 6 (six) hours as needed for mild pain or headache (fever >/= 101).     amLODipine (NORVASC) 10 MG tablet Take 1 tablet by mouth daily.     atorvastatin (LIPITOR)  20 MG tablet Take 20 mg by mouth daily.     cyanocobalamin (VITAMIN B12) 1000 MCG tablet Take 1 tablet (1,000 mcg total) by mouth daily. 90 tablet 1   ferrous sulfate 325 (65 FE) MG EC tablet Take 1 tablet (325 mg total) by mouth daily with breakfast. 30 tablet 3   folic acid (FOLVITE) 1 MG tablet Take 1 tablet (1 mg total) by mouth daily. 30 tablet 3   hydrOXYzine (ATARAX) 25 MG tablet Take 1 tablet (25 mg total) by mouth at bedtime as needed for itching. 30 tablet 11   ipratropium-albuterol (DUONEB) 0.5-2.5 (3) MG/3ML SOLN Take 3 mLs by nebulization every 6 (six) hours as needed. (Patient taking differently: Take 3 mLs by nebulization every 6 (six) hours as needed (shortness of breathing or wheezing).) 360 mL 0   lidocaine (LIDODERM) 5 % Place 1 patch onto the skin daily. Remove & Discard patch within 12 hours or as directed by MD. Apply to low back 30 patch 0   lidocaine-prilocaine (EMLA) cream Apply 1 Application topically as needed. 30 g 0   losartan (COZAAR) 100 MG tablet Take 100 mg by mouth daily.     olmesartan (BENICAR) 40 MG tablet Take 40 mg by mouth daily.     ondansetron (ZOFRAN) 8 MG tablet Take 1 tablet (8 mg total) by mouth every 8 (eight) hours  as needed. 30 tablet 0   polyethylene glycol (MIRALAX) 17 g packet Take 17 g by mouth daily. 30 each 2   potassium chloride SA (KLOR-CON M) 20 MEQ tablet TAKE 1 TABLET(20 MEQ) BY MOUTH DAILY 90 tablet 0   prochlorperazine (COMPAZINE) 10 MG tablet Take 1 tablet (10 mg total) by mouth every 6 (six) hours as needed for nausea or vomiting. 30 tablet 0   senna-docusate (STIMULANT LAXATIVE) 8.6-50 MG tablet Take 2 tablets by mouth 2 (two) times daily. May add additional tablet as needed 150 tablet 3   sodium chloride 1 g tablet Take 1 tablet (1 g total) by mouth 3 (three) times daily with meals. 90 tablet 0   thiamine 100 MG tablet Take 1 tablet (100 mg total) by mouth daily.     traMADol (ULTRAM) 50 MG tablet Take 1-2 tablets (50-100 mg total) by mouth every 6 (six) hours as needed. 120 tablet 0   albuterol (VENTOLIN HFA) 108 (90 Base) MCG/ACT inhaler Inhale 2 puffs into the lungs every 6 (six) hours as needed for wheezing or shortness of breath. 8 g 1   No current facility-administered medications for this visit.    REVIEW OF SYSTEMS:   Constitutional: ( - ) fevers, ( - )  chills , ( - ) night sweats Eyes: ( - ) blurriness of vision, ( - ) double vision, ( - ) watery eyes Ears, nose, mouth, throat, and face: ( - ) mucositis, ( - ) sore throat Respiratory: ( - ) cough, ( - ) dyspnea, ( - ) wheezes Cardiovascular: ( - ) palpitation, ( - ) chest discomfort, ( - ) lower extremity swelling Gastrointestinal:  ( - ) nausea, ( - ) heartburn, ( - ) change in bowel habits Skin: ( - ) abnormal skin rashes Lymphatics: ( - ) new lymphadenopathy, ( - ) easy bruising Neurological: ( - ) numbness, ( - ) tingling, ( - ) new weaknesses Behavioral/Psych: ( - ) mood change, ( - ) new changes  All other systems were reviewed with the patient and are negative.  PHYSICAL EXAMINATION: ECOG PERFORMANCE STATUS:  1 - Symptomatic but completely ambulatory  Vitals:   10/13/22 0825  BP: 131/76  Pulse: 78  Resp: 14   Temp: 97.8 F (36.6 C)  SpO2: 96%   Filed Weights   10/13/22 0825  Weight: 144 lb 11.2 oz (65.6 kg)    GENERAL: Well-appearing elderly African-American male, alert, no distress and comfortable SKIN: skin color, texture, turgor are normal, no rashes or significant lesions EYES: conjunctiva are pink and non-injected, sclera clear LUNGS: clear to auscultation and percussion with normal breathing effort HEART: regular rate & rhythm and no murmurs and no lower extremity edema Musculoskeletal: no cyanosis of digits and no clubbing  PSYCH: alert & oriented x 3, fluent speech NEURO: no focal motor/sensory deficits  LABORATORY DATA:  I have reviewed the data as listed    Latest Ref Rng & Units 10/13/2022    8:02 AM 09/22/2022   12:01 PM 08/25/2022    2:00 PM  CBC  WBC 4.0 - 10.5 K/uL 10.0  8.5  11.1   Hemoglobin 13.0 - 17.0 g/dL 56.2  13.0  86.5   Hematocrit 39.0 - 52.0 % 32.7  29.9  31.4   Platelets 150 - 400 K/uL 419  274  279        Latest Ref Rng & Units 10/13/2022    8:02 AM 09/22/2022   12:01 PM 08/25/2022    2:00 PM  CMP  Glucose 70 - 99 mg/dL 95  81  82   BUN 8 - 23 mg/dL 10  10  9    Creatinine 0.61 - 1.24 mg/dL 7.84  6.96  2.95   Sodium 135 - 145 mmol/L 128  123  122   Potassium 3.5 - 5.1 mmol/L 4.3  4.7  4.6   Chloride 98 - 111 mmol/L 97  94  93   CO2 22 - 32 mmol/L 24  22  22    Calcium 8.9 - 10.3 mg/dL 9.9  9.4  9.4   Total Protein 6.5 - 8.1 g/dL 8.3  8.2  8.4   Total Bilirubin 0.3 - 1.2 mg/dL 0.4  0.4  0.3   Alkaline Phos 38 - 126 U/L 65  59  83   AST 15 - 41 U/L 15  15  16    ALT 0 - 44 U/L 10  7  10      Lab Results  Component Value Date   MPROTEIN Not Observed 04/28/2021   Lab Results  Component Value Date   KPAFRELGTCHN 33.6 (H) 04/28/2021   LAMBDASER 22.0 04/28/2021   KAPLAMBRATIO 1.53 04/28/2021    RADIOGRAPHIC STUDIES: No results found.  ASSESSMENT & PLAN Toussaint Golson. Is a 67 y.o.  male with medical history significant for metastatic  adenocarcinoma of the lung who presents for a follow up visit.  # Metastatic Adenocarcinoma of the Lung  -- NGS testing from this patient shows no evidence of targetable mutation. --MRI brain shows no evidence of intracranial metastases --Started carboplatin, pembrolizumab, and pemetrexed as treatment for his cancer on 07/08/2021. --Received palliative radiation to osseous metastasis of the left shoulder from 08/11/2021-08/24/2021. Planned dose is 30 Gy in 10 fx.  --Discontinued Pemetrexed on 03/17/2022 due to persistent anemia and kidney dysfunction. --Restaging CT CAP from 08/09/2022 shows interval enlargement of multiple mixed lytic and sclerotic bone metastases involving the right pelvis. Other bone metastases including of the left scapula, sacrum, and left hemipelvis are unchanged. Unchanged treated central LUL mass. Recommend to continue on pembrolizumab therapy and arrange for palliative  radiation to enlarging bone metastases.  Plan: --today is Day 1 Cycle 21 of Pembrolizumab today --labs from today were reviewed. Labs show white blood cell count 10.0, hemoglobin 11.0, MCV 91.9, and platelets of 419. Creatinine and LFTs normal.  --Proceed with treatment without any dose modifications.  --Plan for repeat CT imaging around November 2024.  --RTC in 3 week with labs, follow up visit before Cycle 21.   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  --Pain is slightly improved but still present.  --Continue to take tramadol q 6 hours for pain control.   #Hyponatremia: --Chronic in nature --Sodium level dropped from 127 on 08/09/2022 to 118.  Today sodium improved to 128 --Etiologies including SIADH, immunotherapy induced, too much water intake.  --Patient is asymptomatic without nausea/vomiting, headaches, confusion, seizures,  etc --Currently on salt tablets 2 gm PO daily. We advised to titrate to twice daily x 7 days and then three times daily.  --Strict ED precautions given for any new symptoms as discussed above.   # Iron Deficiency Anemia #Chemotherapy induced anemia --Hgb level 11.0 today.  --Currently on ferrous sulfate 325 mg PO daily  #Pain Control --stopped oxycodone due to itching --currently following with Palliative care.  --pain well controlled with tramadol as needed. Continue to take tramdol 50-100 mg q 6 hours as needed. --Continue senna docusate for constipation prophylaxis --Continue to monitor  #Supportive Care -- chemotherapy education complete -- port placed -- zofran 8mg  q8H PRN and compazine 10mg  PO q6H for nausea -- EMLA cream for port -- Patient referred to palliative care for pain control. Pain control as above.    No orders of the defined types were placed in this encounter.   All questions were answered. The patient knows to call the clinic with any problems, questions or concerns.  I have spent a total of 30 minutes minutes of face-to-face and non-face-to-face time, preparing to see the patient, performing a medically appropriate examination, counseling and educating the patient, referring and communicating with other health care professionals, documenting clinical information in the electronic health record, and care coordination.   Ulysees Barns, MD Department of Hematology/Oncology [facility] Phone: [phone] Pager: [phone] Email: [email or web address]  10/13/2022 8:48 AM

## 2022-10-13 NOTE — Progress Notes (Signed)
Nutrition Follow-up:  Patient with metastatic lung cancer. He is currently receiving keytruda q21d.   Met with patient in infusion. He reports tolerating therapy well. Patient endorses a good appetite. Says he is eating 3 good sized meals as well as snacks. Patient eating a sandwich and trail mix at visit this morning. He is planning to have curry chicken and black eyed peas for dinner. Patient is drinking lots of water. He is taking sodium tablets. Patient denies nausea, vomiting, diarrhea. Constipation well managed with daily senna. Patient reports weight fluctuations related to time of day he is weighed.   Medications: reviewed   Labs: Na 128  Anthropometrics: Wt 144 lb 11.2 oz today   NUTRITION DIAGNOSIS: Unintentional wt loss - continues   9/20 - 147 lb 8 oz 8/16 - 144 lb 9.6 oz  7/26 - 147 lb 9.6 oz    INTERVENTION:  Continue eating 3 meals + snacks with adequate calories and protein Continue sodium tablets per MD Continue bowel regimen    MONITORING, EVALUATION, GOAL: wt trends, intake   NEXT VISIT: To be scheduled as needed with treatment

## 2022-10-13 NOTE — Patient Instructions (Signed)

## 2022-10-15 LAB — T4: T4, Total: 6.4 ug/dL (ref 4.5–12.0)

## 2022-10-25 ENCOUNTER — Encounter: Payer: Self-pay | Admitting: Podiatry

## 2022-10-25 ENCOUNTER — Ambulatory Visit (INDEPENDENT_AMBULATORY_CARE_PROVIDER_SITE_OTHER): Payer: Medicare HMO

## 2022-10-25 ENCOUNTER — Ambulatory Visit: Payer: Medicare HMO | Admitting: Podiatry

## 2022-10-25 VITALS — Ht 69.0 in | Wt 144.7 lb

## 2022-10-25 DIAGNOSIS — S90111A Contusion of right great toe without damage to nail, initial encounter: Secondary | ICD-10-CM

## 2022-10-25 DIAGNOSIS — M79675 Pain in left toe(s): Secondary | ICD-10-CM

## 2022-10-25 DIAGNOSIS — B351 Tinea unguium: Secondary | ICD-10-CM | POA: Diagnosis not present

## 2022-10-25 DIAGNOSIS — M79674 Pain in right toe(s): Secondary | ICD-10-CM

## 2022-10-25 DIAGNOSIS — I739 Peripheral vascular disease, unspecified: Secondary | ICD-10-CM | POA: Diagnosis not present

## 2022-10-25 DIAGNOSIS — R0989 Other specified symptoms and signs involving the circulatory and respiratory systems: Secondary | ICD-10-CM

## 2022-10-25 NOTE — Progress Notes (Signed)
This patient returns to my office for at risk foot care.  This patient requires this care by a professional since this patient will be at risk due to having kidney disease and his is taking chemo.  This patient is unable to cut nails himself since the patient cannot reach his nails.These nails are painful walking and wearing shoes. Patient says he has stumped his big toe right foot.  It was severely painful Monday but markedly improved on Tuesdau.  He says the toe has not completely healed and mild pain noted.  This patient presents for at risk foot care today.  General Appearance  Alert, conversant and in no acute stress.  Vascular  Dorsalis pedis and posterior tibial  pulses are palpable  left foot.  Absent pulses right foot.  Capillary return is within normal limits  bilaterally. Temperature is within normal limits  bilaterally.  Neurologic  Senn-Weinstein monofilament wire test within normal limits  bilaterally. Muscle power within normal limits bilaterally.  Nails Thick disfigured discolored nails with subungual debris  from hallux to fifth toes bilaterally. No evidence of bacterial infection or drainage bilaterally.  Orthopedic  No limitations of motion  feet .  No crepitus or effusions noted.  HAV  B/L.  Skin  normotropic skin with no porokeratosis noted bilaterally.  No signs of infections or ulcers noted.     Onychomycosis  Pain in right toes  Pain in left toes   Mechanical debridement of nails 1-5  bilaterally performed with a nail nipper.  Filed with dremel without incident. Xray taken reveal post op second toe right, bunion correction right foot.  Significant osteopenia with possible hairline fracture mid shaft proximal phalanx, properly aligned.   Return office visit   3 months for nail care.  To call the office if surgical shoe is needed.                  Told patient to return for periodic foot care and evaluation due to potential at risk complications.   Helane Gunther DPM

## 2022-10-29 NOTE — Progress Notes (Unsigned)
Patient Care Team: Aliene Beams, MD as PCP - General (Family Medicine)   CHIEF COMPLAINT: Follow up metastatic lung cancer   CURRENT THERAPY: Pembrolizumab q21 days  INTERVAL HISTORY Mr. Vanderzee returns for follow up as scheduled. Last seen by Dr. Leonides Schanz 10/13/22 with C21 Pembro.  He tolerates treatment well overall.  His "usual pain" is stable, takes tramadol as needed.  Pain is currently low at a 4/10.  He is eating and drinking with good energy level.  No fall at home.  Bowels moving.  Denies new or worsening cough, chest pain, or dyspnea.  He had some brief tingling in the left arm this morning that resolved, denies other neuropathy.  ROS  All other systems reviewed and negative  Past Medical History:  Diagnosis Date   Alcohol abuse    Asthma    Bullous emphysema ([name])    Essential hypertension 04/10/2020   Hemorrhoids    History of radiation therapy 04/08/20-04/19/20   IMRT- Left lung- Dr. Antony Blackbird   Incidental lung nodule, greater than or equal to 8mm 10/22/2017   Left upper lobe - discovered on CTA   Prostate cancer ([name])    Spontaneous pneumothorax 10/20/2017   right   Tobacco abuse      Past Surgical History:  Procedure Laterality Date   BACK SURGERY     IR IMAGING GUIDED PORT INSERTION  06/29/2021   PROSTATE BIOPSY       Outpatient Encounter Medications as of 11/02/2022  Medication Sig Note   acetaminophen (TYLENOL) 325 MG tablet Take 2 tablets (650 mg total) by mouth every 6 (six) hours as needed for mild pain or headache (fever >/= 101).    amLODipine (NORVASC) 10 MG tablet Take 1 tablet by mouth daily.    atorvastatin (LIPITOR) 20 MG tablet Take 20 mg by mouth daily.    cyanocobalamin (VITAMIN B12) 1000 MCG tablet Take 1 tablet (1,000 mcg total) by mouth daily.    ferrous sulfate 325 (65 FE) MG EC tablet Take 1 tablet (325 mg total) by mouth daily with breakfast.    folic acid (FOLVITE) 1 MG tablet Take 1 tablet (1 mg total) by mouth daily.     hydrOXYzine (ATARAX) 25 MG tablet Take 1 tablet (25 mg total) by mouth at bedtime as needed for itching.    ipratropium-albuterol (DUONEB) 0.5-2.5 (3) MG/3ML SOLN Take 3 mLs by nebulization every 6 (six) hours as needed. (Patient taking differently: Take 3 mLs by nebulization every 6 (six) hours as needed (shortness of breathing or wheezing).) 06/29/2021: Unknown   lidocaine (LIDODERM) 5 % Place 1 patch onto the skin daily. Remove & Discard patch within 12 hours or as directed by MD. Apply to low back    lidocaine-prilocaine (EMLA) cream Apply 1 Application topically as needed.    losartan (COZAAR) 100 MG tablet Take 100 mg by mouth daily.    olmesartan (BENICAR) 40 MG tablet Take 40 mg by mouth daily.    ondansetron (ZOFRAN) 8 MG tablet Take 1 tablet (8 mg total) by mouth every 8 (eight) hours as needed.    polyethylene glycol (MIRALAX) 17 g packet Take 17 g by mouth daily.    potassium chloride SA (KLOR-CON M) 20 MEQ tablet TAKE 1 TABLET(20 MEQ) BY MOUTH DAILY    prochlorperazine (COMPAZINE) 10 MG tablet Take 1 tablet (10 mg total) by mouth every 6 (six) hours as needed for nausea or vomiting.    senna-docusate (STIMULANT LAXATIVE) 8.6-50 MG tablet Take 2  tablets by mouth 2 (two) times daily. May add additional tablet as needed    sodium chloride 1 g tablet Take 1 tablet (1 g total) by mouth 3 (three) times daily with meals.    thiamine 100 MG tablet Take 1 tablet (100 mg total) by mouth daily.    traMADol (ULTRAM) 50 MG tablet Take 1-2 tablets (50-100 mg total) by mouth every 6 (six) hours as needed.    albuterol (VENTOLIN HFA) 108 (90 Base) MCG/ACT inhaler Inhale 2 puffs into the lungs every 6 (six) hours as needed for wheezing or shortness of breath.    No facility-administered encounter medications on file as of 11/02/2022.     Today's Vitals   11/02/22 0800 11/02/22 0827  BP:  118/76  Pulse:  73  Resp:  18  Temp:  97.7 F (36.5 C)  TempSrc:  Oral  Weight:  148 lb 3.2 oz (67.2 kg)   Height:  5\' 9"  (1.753 m)  PainSc: 6     Body mass index is 21.89 kg/m.   PHYSICAL EXAM GENERAL:alert, no distress and comfortable SKIN: no rash  EYES: sclera clear NECK: without mass LYMPH:  no palpable cervical or supraclavicular lymphadenopathy  LUNGS: clear with normal breathing effort HEART: regular rate & rhythm, no lower extremity edema ABDOMEN: abdomen soft, non-tender and normal bowel sounds NEURO: alert & oriented x 3 with fluent speech PAC without erythema    CBC    Component Value Date/Time   WBC 9.2 11/02/2022 0742   WBC 11.4 (H) 08/02/2021 1324   RBC 3.58 (L) 11/02/2022 0742   HGB 11.0 (L) 11/02/2022 0742   HCT 32.9 (L) 11/02/2022 0742   PLT 354 11/02/2022 0742   MCV 91.9 11/02/2022 0742   MCH 30.7 11/02/2022 0742   MCHC 33.4 11/02/2022 0742   RDW 14.2 11/02/2022 0742   LYMPHSABS 1.0 11/02/2022 0742   MONOABS 1.0 11/02/2022 0742   EOSABS 0.3 11/02/2022 0742   BASOSABS 0.0 11/02/2022 0742     CMP     Component Value Date/Time   NA 132 (L) 11/02/2022 0742   K 4.4 11/02/2022 0742   CL 101 11/02/2022 0742   CO2 24 11/02/2022 0742   GLUCOSE 97 11/02/2022 0742   BUN 12 11/02/2022 0742   CREATININE 1.08 11/02/2022 0742   CALCIUM 9.6 11/02/2022 0742   PROT 8.2 (H) 11/02/2022 0742   ALBUMIN 3.9 11/02/2022 0742   AST 14 (L) 11/02/2022 0742   ALT 8 11/02/2022 0742   ALKPHOS 67 11/02/2022 0742   BILITOT 0.4 11/02/2022 0742   GFRNONAA >60 11/02/2022 0742   GFRAA >60 06/06/2019 0315     ASSESSMENT & PLAN: 67 yo male with   # Metastatic Adenocarcinoma of the Lung  -01/07/2020 : CT of the chest with contrast performed which showed interval enlargement of a spiculated nodule in the central left upper lobe measuring 1.2 x 1.0 cm and highly suspicious for primary lung malignancy -02/06/2020 :PET scan performed which showed a hypermetabolic irregular solid 1.3 cm left upper lobe pulmonary nodule compatible with malignancy without any other hypermetabolic  lesions -04/08/2020- 04/19/2020:  SBRT to the lung lesion -04/28/2021: CT C/A/P showed multifocal lytic bone metastases are identified. Lesion within the spine of the left scapula and lesions involving bilateral iliac bones and left sacral wing. Establish care with Dr. Leonides Schanz  -05/03/2021: biopsy of right iliac lytic lesion showed metastatic carcinoma, consistent with lung primary.  -07/08/2021 - 09/09/2021: 4 cycles of Carbo/Pemetrexed/Pembrolizumab   -08/11/2021-08/24/2021: Received  palliative radiation to osseous metastasis in the left shoulder. 30 Gy in 10 fractions.  -09/23/2021: CT CAP: stable disease -09/30/2021 - 05/23/22 changed to Pemetrexed/Pembro, with intermittent holding of pemetrexed due to cytopenias and decreased renal function  -06/15/2022: Changed to single agent Pembrolizumab s/p C21 08/28/2022-09/13/2022: Received palliative radiation to right pelvis with total dose of 30 Gy in 10 Fx. pain improved, currently managed with tramadol as needed   #Adenocarcinoma of the Prostate, [id number] -S/p radiation per Dr. Kathrynn Running 08/19/2019-10/07/2019: 70 Gy in 28 fractions of 2.5 Gy     Disposition: Mr. Essary appears stable.  S/p cycle 21 pembrolizumab, tolerating very well without significant toxicities.  He is able to recover and function and maintain adequate performance status.  There is no clinical evidence of disease progression.  Labs reviewed, adequate to proceed with cycle 22 Pembro today as scheduled, no dose adjustments.    Pain is stable, he will follow up with palliative care today.  He has asked for an updated PSA which we will draw today.    Proceed with restaging CT CAP before next cycle.  Follow-up with scan review and C 23 Pembro in 3 weeks.  Orders Placed This Encounter  Procedures   CT CHEST ABDOMEN PELVIS W CONTRAST    Standing Status:   Future    Standing Expiration Date:   11/02/2023    Order Specific Question:   If indicated for the ordered procedure, I authorize the  administration of contrast media per Radiology protocol    Answer:   Yes    Order Specific Question:   Does the patient have a contrast media/X-ray dye allergy?    Answer:   No    Order Specific Question:   Preferred imaging location?    Answer:   [facility]    Order Specific Question:   If indicated for the ordered procedure, I authorize the administration of oral contrast media per Radiology protocol    Answer:   Yes   Prostate-Specific AG, Serum    Standing Status:   Future    Standing Expiration Date:   11/02/2023      All questions were answered. The patient knows to call the clinic with any problems, questions or concerns. No barriers to learning were detected.  Santiago Glad, NP-C 11/02/2022

## 2022-10-30 NOTE — Progress Notes (Unsigned)
Palliative Medicine [facility]  Telephone:([phone]) [phone] Fax:(336) [phone]   Name: Terry Page. Date: 10/30/2022 MRN: [id number]  DOB: 07/23/1955  Patient Care Team: Aliene Beams, MD as PCP - General (Family Medicine)   INTERVAL HISTORY: Terry Page. is a 67 y.o. 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 He is currently undergoing chemotherapy. Palliative ask to see for symptom management.  SOCIAL HISTORY:    Terry Page reports that he quit smoking about 2 years ago. His smoking use included cigarettes. He has never used smokeless tobacco. He reports current alcohol use of about 12.0 standard drinks of alcohol per week. He reports that he does not use drugs.  ADVANCE DIRECTIVES:  None on file   CODE STATUS: Full Code  PAST MEDICAL HISTORY: Past Medical History:  Diagnosis Date   Alcohol abuse    Asthma    Bullous emphysema ([name])    Essential hypertension 04/10/2020   Hemorrhoids    History of radiation therapy 04/08/20-04/19/20   IMRT- Left lung- Dr. Antony Blackbird   Incidental lung nodule, greater than or equal to 8mm 10/22/2017   Left upper lobe - discovered on CTA   Prostate cancer ([name])    Spontaneous pneumothorax 10/20/2017   right   Tobacco abuse     ALLERGIES:  is allergic to morphine and oxycodone.  MEDICATIONS:  Current Outpatient Medications  Medication Sig Dispense Refill   acetaminophen (TYLENOL) 325 MG tablet Take 2 tablets (650 mg total) by mouth every 6 (six) hours as needed for mild pain or headache (fever >/= 101).     albuterol (VENTOLIN HFA) 108 (90 Base) MCG/ACT inhaler Inhale 2 puffs into the lungs every 6 (six) hours as needed for wheezing or shortness of breath. 8 g 1   amLODipine (NORVASC) 10 MG tablet Take 1 tablet by mouth daily.     atorvastatin (LIPITOR)  20 MG tablet Take 20 mg by mouth daily.     cyanocobalamin (VITAMIN B12) 1000 MCG tablet Take 1 tablet (1,000 mcg total) by mouth daily. 90 tablet 1   ferrous sulfate 325 (65 FE) MG EC tablet Take 1 tablet (325 mg total) by mouth daily with breakfast. 30 tablet 3   folic acid (FOLVITE) 1 MG tablet Take 1 tablet (1 mg total) by mouth daily. 30 tablet 3   hydrOXYzine (ATARAX) 25 MG tablet Take 1 tablet (25 mg total) by mouth at bedtime as needed for itching. 30 tablet 11   ipratropium-albuterol (DUONEB) 0.5-2.5 (3) MG/3ML SOLN Take 3 mLs by nebulization every 6 (six) hours as needed. (Patient taking differently: Take 3 mLs by nebulization every 6 (six) hours as needed (shortness of breathing or wheezing).) 360 mL 0   lidocaine (LIDODERM) 5 % Place 1 patch onto the skin daily. Remove & Discard patch within 12 hours or as directed by MD. Apply to low back 30 patch 0   lidocaine-prilocaine (EMLA) cream Apply 1 Application topically as needed. 30 g 0   losartan (COZAAR) 100 MG tablet Take 100 mg by mouth daily.     olmesartan (BENICAR) 40 MG tablet Take 40 mg by mouth daily.     ondansetron (ZOFRAN) 8 MG tablet Take 1 tablet (8 mg total) by mouth every 8 (eight) hours as needed. 30 tablet 0   polyethylene glycol (MIRALAX) 17 g packet Take 17 g by  mouth daily. 30 each 2   potassium chloride SA (KLOR-CON M) 20 MEQ tablet TAKE 1 TABLET(20 MEQ) BY MOUTH DAILY 90 tablet 0   prochlorperazine (COMPAZINE) 10 MG tablet Take 1 tablet (10 mg total) by mouth every 6 (six) hours as needed for nausea or vomiting. 30 tablet 0   senna-docusate (STIMULANT LAXATIVE) 8.6-50 MG tablet Take 2 tablets by mouth 2 (two) times daily. May add additional tablet as needed 150 tablet 3   sodium chloride 1 g tablet Take 1 tablet (1 g total) by mouth 3 (three) times daily with meals. 90 tablet 0   thiamine 100 MG tablet Take 1 tablet (100 mg total) by mouth daily.     traMADol (ULTRAM) 50 MG tablet Take 1-2 tablets (50-100 mg total) by  mouth every 6 (six) hours as needed. 120 tablet 0   No current facility-administered medications for this visit.    VITAL SIGNS: There were no vitals taken for this visit. There were no vitals filed for this visit.  Estimated body mass index is 21.37 kg/m as calculated from the following:   Height as of 10/25/22: 5\' 9"  (1.753 m).   Weight as of 10/25/22: 144 lb 11.2 oz (65.6 kg).   PERFORMANCE STATUS (ECOG) : 1 - Symptomatic but completely ambulatory  Assessment NAD RRR Normal breathing pattern Dark areas to upper and lower extremities, scabbed areas AAO x4  IMPRESSION: Terry Page presented to clinic for follow-up. No acute distress. Denies nausea, vomiting, constipation, or diarrhea. Ambulatory with cane. States doing well overall. Appetite is good.   Neoplasm related pain  Terry Page unfortunately states he began developing itching and a rash with hydromorphone. He reports this did not happen with first round of medication only once he refilled medication. Some scabbed areas where he has been scratching. He discontinued use of hydromorphone. States symptoms have almost completely resolved. Rash is healing. Minimal itching. He has hydroxyzine as needed.   Terry Page states he restarted his Tramadol for pain. Reports back and leg pain are well controlled on current regimen of Tramadol 50-100 mg as needed. Tolerating without difficulty. Some days are better than others.   No changes at this time will continue with Tramadol. We will continue to closely monitor.   Constipation  Controlled with daily regimen.   We discussed the importance of continued conversation with family and their medical providers regarding overall plan of care and treatment options, ensuring decisions are within the context of the patients values and GOCs.   PLAN: Tramadol 50-100 mg every 6 hours as needed for pain. Senna-S daily for constipation. Miralax available if needed. Palliative will plan to see patient  back in 3-4 weeks in collaboration with his other oncology appointments. Will give him a call next week for close symptom follow-up.    Patient expressed understanding and was in agreement with this plan. He also understands that He can call the clinic at any time with any questions, concerns, or complaints.    Any controlled substances utilized were prescribed in the context of palliative care. PDMP has been reviewed.    Visit consisted of counseling and education dealing with the complex and emotionally intense issues of symptom management and palliative care in the setting of serious and potentially life-threatening illness.Greater than 50%  of this time was spent counseling and coordinating care related to the above assessment and plan.  [name], AGPCNP-BC  Palliative Medicine Team/[facility]  *Please note that this is a verbal dictation therefore  any spelling or grammatical errors are due to the "[employer]" system interpretation.

## 2022-11-02 ENCOUNTER — Other Ambulatory Visit: Payer: Self-pay

## 2022-11-02 ENCOUNTER — Encounter: Payer: Self-pay | Admitting: Nurse Practitioner

## 2022-11-02 ENCOUNTER — Inpatient Hospital Stay: Payer: Medicare HMO | Admitting: Nurse Practitioner

## 2022-11-02 ENCOUNTER — Inpatient Hospital Stay: Payer: Medicare HMO

## 2022-11-02 ENCOUNTER — Inpatient Hospital Stay (HOSPITAL_BASED_OUTPATIENT_CLINIC_OR_DEPARTMENT_OTHER): Payer: Medicare HMO | Admitting: Nurse Practitioner

## 2022-11-02 VITALS — BP 118/76 | HR 73 | Temp 97.7°F | Resp 18 | Ht 69.0 in | Wt 148.2 lb

## 2022-11-02 DIAGNOSIS — C3490 Malignant neoplasm of unspecified part of unspecified bronchus or lung: Secondary | ICD-10-CM

## 2022-11-02 DIAGNOSIS — C7951 Secondary malignant neoplasm of bone: Secondary | ICD-10-CM

## 2022-11-02 DIAGNOSIS — C3412 Malignant neoplasm of upper lobe, left bronchus or lung: Secondary | ICD-10-CM | POA: Diagnosis not present

## 2022-11-02 DIAGNOSIS — G893 Neoplasm related pain (acute) (chronic): Secondary | ICD-10-CM | POA: Diagnosis not present

## 2022-11-02 DIAGNOSIS — C61 Malignant neoplasm of prostate: Secondary | ICD-10-CM | POA: Diagnosis not present

## 2022-11-02 DIAGNOSIS — Z95828 Presence of other vascular implants and grafts: Secondary | ICD-10-CM

## 2022-11-02 DIAGNOSIS — Z5112 Encounter for antineoplastic immunotherapy: Secondary | ICD-10-CM | POA: Diagnosis not present

## 2022-11-02 DIAGNOSIS — Z515 Encounter for palliative care: Secondary | ICD-10-CM | POA: Diagnosis not present

## 2022-11-02 LAB — CBC WITH DIFFERENTIAL (CANCER CENTER ONLY)
Abs Immature Granulocytes: 0.08 10*3/uL — ABNORMAL HIGH (ref 0.00–0.07)
Basophils Absolute: 0 10*3/uL (ref 0.0–0.1)
Basophils Relative: 0 %
Eosinophils Absolute: 0.3 10*3/uL (ref 0.0–0.5)
Eosinophils Relative: 3 %
HCT: 32.9 % — ABNORMAL LOW (ref 39.0–52.0)
Hemoglobin: 11 g/dL — ABNORMAL LOW (ref 13.0–17.0)
Immature Granulocytes: 1 %
Lymphocytes Relative: 11 %
Lymphs Abs: 1 10*3/uL (ref 0.7–4.0)
MCH: 30.7 pg (ref 26.0–34.0)
MCHC: 33.4 g/dL (ref 30.0–36.0)
MCV: 91.9 fL (ref 80.0–100.0)
Monocytes Absolute: 1 10*3/uL (ref 0.1–1.0)
Monocytes Relative: 11 %
Neutro Abs: 6.8 10*3/uL (ref 1.7–7.7)
Neutrophils Relative %: 74 %
Platelet Count: 354 10*3/uL (ref 150–400)
RBC: 3.58 MIL/uL — ABNORMAL LOW (ref 4.22–5.81)
RDW: 14.2 % (ref 11.5–15.5)
WBC Count: 9.2 10*3/uL (ref 4.0–10.5)
nRBC: 0 % (ref 0.0–0.2)

## 2022-11-02 LAB — CMP (CANCER CENTER ONLY)
ALT: 8 U/L (ref 0–44)
AST: 14 U/L — ABNORMAL LOW (ref 15–41)
Albumin: 3.9 g/dL (ref 3.5–5.0)
Alkaline Phosphatase: 67 U/L (ref 38–126)
Anion gap: 7 (ref 5–15)
BUN: 12 mg/dL (ref 8–23)
CO2: 24 mmol/L (ref 22–32)
Calcium: 9.6 mg/dL (ref 8.9–10.3)
Chloride: 101 mmol/L (ref 98–111)
Creatinine: 1.08 mg/dL (ref 0.61–1.24)
GFR, Estimated: >60 mL/min (ref >60)
Glucose, Bld: 97 mg/dL (ref 70–99)
Potassium: 4.4 mmol/L (ref 3.5–5.1)
Sodium: 132 mmol/L — ABNORMAL LOW (ref 135–145)
Total Bilirubin: 0.4 mg/dL (ref 0.3–1.2)
Total Protein: 8.2 g/dL — ABNORMAL HIGH (ref 6.5–8.1)

## 2022-11-02 LAB — TSH: TSH: 1.192 u[IU]/mL (ref 0.350–4.500)

## 2022-11-02 MED ORDER — HEPARIN SOD (PORK) LOCK FLUSH 100 UNIT/ML IV SOLN
500.0000 [IU] | Freq: Once | INTRAVENOUS | Status: AC | PRN
Start: 2022-11-02 — End: 2022-11-02
  Administered 2022-11-02: 500 [IU]

## 2022-11-02 MED ORDER — SODIUM CHLORIDE 0.9 % IV SOLN
200.0000 mg | Freq: Once | INTRAVENOUS | Status: AC
  Administered 2022-11-02: 200 mg via INTRAVENOUS
  Filled 2022-11-02: qty 200

## 2022-11-02 MED ORDER — SODIUM CHLORIDE 0.9% FLUSH
10.0000 mL | Freq: Once | INTRAVENOUS | Status: AC
  Administered 2022-11-02: 10 mL

## 2022-11-02 MED ORDER — SODIUM CHLORIDE 0.9 % IV SOLN: Freq: Once | INTRAVENOUS | Status: AC

## 2022-11-02 MED ORDER — TRAMADOL HCL 50 MG PO TABS: 50.0000 mg | ORAL_TABLET | Freq: Four times a day (QID) | ORAL | 0 refills | Status: DC | PRN

## 2022-11-02 MED ORDER — SODIUM CHLORIDE 0.9% FLUSH
10.0000 mL | INTRAVENOUS | Status: DC | PRN
  Administered 2022-11-02: 10 mL

## 2022-11-02 NOTE — Patient Instructions (Signed)

## 2022-11-03 ENCOUNTER — Encounter: Payer: Self-pay | Admitting: Hematology and Oncology

## 2022-11-03 LAB — T4: T4, Total: 7.3 ug/dL (ref 4.5–12.0)

## 2022-11-03 LAB — PROSTATE-SPECIFIC AG, SERUM (LABCORP): Prostate Specific Ag, Serum: 0.2 ng/mL (ref 0.0–4.0)

## 2022-11-06 ENCOUNTER — Telehealth: Payer: Self-pay

## 2022-11-06 NOTE — Telephone Encounter (Signed)
-----   Message from Pollyann Samples sent at 11/06/2022  8:05 AM EST ----- Please let pt know PSA 0.2, normal, no concerns.  Thanks, Clayborn Heron NP

## 2022-11-06 NOTE — Telephone Encounter (Signed)
Pt advised with VU and was very pleased with the news.

## 2022-11-07 ENCOUNTER — Ambulatory Visit
Admission: RE | Admit: 2022-11-07 | Discharge: 2022-11-07 | Disposition: A | Discharge status: Home / Self Care | Payer: Medicare HMO | Source: Ambulatory Visit | Attending: Radiation Oncology | Admitting: Radiation Oncology

## 2022-11-07 NOTE — Progress Notes (Signed)
  Radiation Oncology         [phone] ________________________________  Name: Terry Page. MRN: [id number]  Date of Service: 11/07/2022  DOB: 14-Apr-1955  Post Treatment Telephone Note  Diagnosis:  metastatic lung cancer with painful osseous metastasis in the right pelvis. (as documented in provider EOT note)  The patient was available for call today.  The patient did note fatigue during radiation. The patient did not note skin changes in the field of radiation during therapy. The patient has noticed improvement in pain in the area(s) treated with radiation. The patient is not taking dexamethasone. The patient does not have symptoms of  weakness or loss of control of the extremities. The patient does not have symptoms of headache. The patient does not have symptoms of seizure or uncontrolled movement. The patient does not have symptoms of changes in vision. The patient does not have changes in speech. The patient does not have confusion.   The patient is not scheduled for ongoing care with Dr. Leonides Schanz in medical oncology. The patient was encouraged to call if he develops concerns or questions regarding radiation.   This concludes the interaction.  Ruel Favors, LPN

## 2022-11-10 ENCOUNTER — Other Ambulatory Visit: Payer: Self-pay | Admitting: *Deleted

## 2022-11-10 MED ORDER — SODIUM CHLORIDE 1 G PO TABS: 1.0000 g | ORAL_TABLET | Freq: Three times a day (TID) | ORAL | 3 refills | Status: DC

## 2022-11-15 ENCOUNTER — Other Ambulatory Visit: Payer: Self-pay | Admitting: Physician Assistant

## 2022-11-16 ENCOUNTER — Encounter: Payer: Self-pay | Admitting: Hematology and Oncology

## 2022-11-17 ENCOUNTER — Ambulatory Visit (HOSPITAL_COMMUNITY)
Admission: RE | Admit: 2022-11-17 | Discharge: 2022-11-17 | Disposition: A | Discharge status: Home / Self Care | Payer: Medicare HMO | Source: Ambulatory Visit | Attending: Nurse Practitioner | Admitting: Nurse Practitioner

## 2022-11-17 DIAGNOSIS — C3412 Malignant neoplasm of upper lobe, left bronchus or lung: Secondary | ICD-10-CM | POA: Diagnosis present

## 2022-11-17 MED ORDER — HEPARIN SOD (PORK) LOCK FLUSH 100 UNIT/ML IV SOLN
500.0000 [IU] | Freq: Once | INTRAVENOUS | Status: AC
  Administered 2022-11-17: 500 [IU] via INTRAVENOUS

## 2022-11-17 MED ORDER — IOHEXOL 300 MG/ML  SOLN
100.0000 mL | Freq: Once | INTRAMUSCULAR | Status: AC | PRN
  Administered 2022-11-17: 100 mL via INTRAVENOUS

## 2022-11-20 NOTE — Progress Notes (Signed)
Palliative Medicine [facility]  [phone] [phone]   Name: Terry Page. Date: 11/20/2022 MRN: [id number]  DOB: 12-28-1955  Patient Care Team: Aliene Beams, MD as PCP - General (Family Medicine)   INTERVAL HISTORY: Terry Koerber. is a 67 y.o. 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 He is currently undergoing chemotherapy. Palliative ask to see for symptom management.  SOCIAL HISTORY:    Terry Page reports that he quit smoking about 2 years ago. His smoking use included cigarettes. He has never used smokeless tobacco. He reports current alcohol use of about 12.0 standard drinks of alcohol per week. He reports that he does not use drugs.  ADVANCE DIRECTIVES:  None on file   CODE STATUS: Full Code  PAST MEDICAL HISTORY: Past Medical History:  Diagnosis Date   Alcohol abuse    Asthma    Bullous emphysema ([name])    Essential hypertension 04/10/2020   Hemorrhoids    History of radiation therapy 04/08/20-04/19/20   IMRT- Left lung- Dr. Antony Blackbird   Incidental lung nodule, greater than or equal to 8mm 10/22/2017   Left upper lobe - discovered on CTA   Prostate cancer ([name])    Spontaneous pneumothorax 10/20/2017   right   Tobacco abuse     ALLERGIES:  is allergic to morphine and oxycodone.  MEDICATIONS:  Current Outpatient Medications  Medication Sig Dispense Refill   acetaminophen (TYLENOL) 325 MG tablet Take 2 tablets (650 mg total) by mouth every 6 (six) hours as needed for mild pain or headache (fever >/= 101).     albuterol (VENTOLIN HFA) 108 (90 Base) MCG/ACT inhaler Inhale 2 puffs into the lungs every 6 (six) hours as needed for wheezing or shortness of breath. 8 g 1   amLODipine (NORVASC) 10 MG tablet Take 1 tablet by mouth daily.     atorvastatin (LIPITOR)  20 MG tablet Take 20 mg by mouth daily.     cyanocobalamin (VITAMIN B12) 1000 MCG tablet Take 1 tablet (1,000 mcg total) by mouth daily. 90 tablet 1   ferrous sulfate 325 (65 FE) MG EC tablet Take 1 tablet (325 mg total) by mouth daily with breakfast. 30 tablet 3   folic acid (FOLVITE) 1 MG tablet Take 1 tablet (1 mg total) by mouth daily. 30 tablet 3   hydrOXYzine (ATARAX) 25 MG tablet Take 1 tablet (25 mg total) by mouth at bedtime as needed for itching. 30 tablet 11   ipratropium-albuterol (DUONEB) 0.5-2.5 (3) MG/3ML SOLN Take 3 mLs by nebulization every 6 (six) hours as needed. (Patient taking differently: Take 3 mLs by nebulization every 6 (six) hours as needed (shortness of breathing or wheezing).) 360 mL 0   lidocaine (LIDODERM) 5 % Place 1 patch onto the skin daily. Remove & Discard patch within 12 hours or as directed by MD. Apply to low back 30 patch 0   lidocaine-prilocaine (EMLA) cream Apply 1 Application topically as needed. 30 g 0   losartan (COZAAR) 100 MG tablet Take 100 mg by mouth daily.     olmesartan (BENICAR) 40 MG tablet Take 40 mg by mouth daily.     ondansetron (ZOFRAN) 8 MG tablet Take 1 tablet (8 mg total) by mouth every 8 (eight) hours as needed. 30 tablet 0   polyethylene glycol (MIRALAX) 17 g packet Take 17 g by  mouth daily. 30 each 2   potassium chloride SA (KLOR-CON M) 20 MEQ tablet TAKE 1 TABLET(20 MEQ) BY MOUTH DAILY 90 tablet 0   prochlorperazine (COMPAZINE) 10 MG tablet Take 1 tablet (10 mg total) by mouth every 6 (six) hours as needed for nausea or vomiting. 30 tablet 0   senna-docusate (STIMULANT LAXATIVE) 8.6-50 MG tablet Take 2 tablets by mouth 2 (two) times daily. May add additional tablet as needed 150 tablet 3   sodium chloride 1 g tablet Take 1 tablet (1 g total) by mouth 3 (three) times daily with meals. 90 tablet 3   thiamine 100 MG tablet Take 1 tablet (100 mg total) by mouth daily.     traMADol (ULTRAM) 50 MG tablet Take 1-2 tablets (50-100 mg total) by  mouth every 6 (six) hours as needed. 120 tablet 0   No current facility-administered medications for this visit.    VITAL SIGNS: There were no vitals taken for this visit. There were no vitals filed for this visit.  Estimated body mass index is 21.89 kg/m as calculated from the following:   Height as of 11/02/22: 5\' 9"  (1.753 m).   Weight as of 11/02/22: 148 lb 3.2 oz (67.2 kg).   PERFORMANCE STATUS (ECOG) : 1 - Symptomatic but completely ambulatory  Assessment NAD RRR Normal breathing pattern AAO x4  Discussed the use of AI scribe software for clinical note transcription with the patient, who gave verbal consent to proceed.   IMPRESSION:  Terry Page presents to clinic for follow-up. No acute distress noted. The patient reports no issues with constipation and maintains a good appetite. He has not reported any new symptoms or changes in his overall health status. He is planning to spend the Thanksgiving holiday with his son.   Unfortunately, recent scans showed progression. He met with his Oncology team today and was informed of this and what next plan of care would look like. Terry Page share he is due to start a new, stronger treatment regimen for his malignancy in the first week of December, which will be administered every three weeks. He expresses concern about potential side effects but is hopeful that this regimen will be more effective. He was hoping for better news but is going to try to remain positive. I created space and opportunity allowing him to openly express his thoughts and feelings. Emotional support provided.   Terry Page has been managing his pain with Tramadol. He reports that the medication has been effective in 'leveling out' his pain. However, following radiation therapy for his hip, he noticed an increase in his pain levels, requiring him to take a little more often throughout the day. Over the past week, his pain levels have decreased, and he has reduced his  Tramadol intake to one or two a day, as needed. We will continue to closely monitor and support as needed.  PLAN:  Cancer Pain Management Pain well controlled with Tramadol. Increased use after hip radiation but has decreased recently. No constipation reported. -Continue Tramadol as needed for pain control.  Cancer Treatment Patient recently completed radiation for hip. New treatment regimen to start on December 08, 2022, every three weeks due to progression. Patient expressed concern about potential side effects. -Start new treatment regimen on December 08, 2022 per Oncologist.  -Monitor closely for side effects and adjust treatment as necessary. Assist with symptom management as needed.   General Health Maintenance Good appetite, no constipation. -Continue current diet and lifestyle habits. -We will plan to  continue close follow-up. -Will plan to see patient back in the clinic after first treatment. He knows to contact office sooner if needed.  Patient expressed understanding and was in agreement with this plan. He also understands that He can call the clinic at any time with any questions, concerns, or complaints.   Any controlled substances utilized were prescribed in the context of palliative care. PDMP has been reviewed.    Visit consisted of counseling and education dealing with the complex and emotionally intense issues of symptom management and palliative care in the setting of serious and potentially life-threatening illness.  [name], AGPCNP-BC  Palliative Medicine Team/[facility]  *Please note that this is a verbal dictation therefore any spelling or grammatical errors are due to the "[employer] One" system interpretation.

## 2022-11-23 ENCOUNTER — Other Ambulatory Visit: Payer: Self-pay

## 2022-11-23 DIAGNOSIS — Z515 Encounter for palliative care: Secondary | ICD-10-CM

## 2022-11-23 DIAGNOSIS — C7951 Secondary malignant neoplasm of bone: Secondary | ICD-10-CM

## 2022-11-23 DIAGNOSIS — G893 Neoplasm related pain (acute) (chronic): Secondary | ICD-10-CM

## 2022-11-23 MED ORDER — TRAMADOL HCL 50 MG PO TABS: 50.0000 mg | ORAL_TABLET | Freq: Four times a day (QID) | ORAL | 0 refills | Status: DC | PRN

## 2022-11-24 ENCOUNTER — Inpatient Hospital Stay (HOSPITAL_BASED_OUTPATIENT_CLINIC_OR_DEPARTMENT_OTHER): Payer: Medicare HMO | Admitting: Physician Assistant

## 2022-11-24 ENCOUNTER — Inpatient Hospital Stay (HOSPITAL_BASED_OUTPATIENT_CLINIC_OR_DEPARTMENT_OTHER): Payer: Medicare HMO | Admitting: Nurse Practitioner

## 2022-11-24 ENCOUNTER — Inpatient Hospital Stay: Payer: Medicare HMO | Attending: Hematology and Oncology

## 2022-11-24 ENCOUNTER — Encounter: Payer: Self-pay | Admitting: Hematology and Oncology

## 2022-11-24 ENCOUNTER — Inpatient Hospital Stay: Payer: Medicare HMO

## 2022-11-24 VITALS — BP 113/75 | HR 88 | Temp 97.2°F | Resp 16 | Ht 69.0 in | Wt 148.3 lb

## 2022-11-24 DIAGNOSIS — R530 Neoplastic (malignant) related fatigue: Secondary | ICD-10-CM

## 2022-11-24 DIAGNOSIS — C3412 Malignant neoplasm of upper lobe, left bronchus or lung: Secondary | ICD-10-CM

## 2022-11-24 DIAGNOSIS — Z95828 Presence of other vascular implants and grafts: Secondary | ICD-10-CM

## 2022-11-24 DIAGNOSIS — C7951 Secondary malignant neoplasm of bone: Secondary | ICD-10-CM | POA: Insufficient documentation

## 2022-11-24 DIAGNOSIS — C61 Malignant neoplasm of prostate: Secondary | ICD-10-CM | POA: Insufficient documentation

## 2022-11-24 DIAGNOSIS — G893 Neoplasm related pain (acute) (chronic): Secondary | ICD-10-CM | POA: Diagnosis not present

## 2022-11-24 DIAGNOSIS — Z87891 Personal history of nicotine dependence: Secondary | ICD-10-CM | POA: Insufficient documentation

## 2022-11-24 DIAGNOSIS — D6481 Anemia due to antineoplastic chemotherapy: Secondary | ICD-10-CM | POA: Diagnosis not present

## 2022-11-24 DIAGNOSIS — E871 Hypo-osmolality and hyponatremia: Secondary | ICD-10-CM | POA: Insufficient documentation

## 2022-11-24 DIAGNOSIS — Z923 Personal history of irradiation: Secondary | ICD-10-CM | POA: Insufficient documentation

## 2022-11-24 DIAGNOSIS — Z7189 Other specified counseling: Secondary | ICD-10-CM | POA: Diagnosis not present

## 2022-11-24 DIAGNOSIS — I10 Essential (primary) hypertension: Secondary | ICD-10-CM | POA: Insufficient documentation

## 2022-11-24 DIAGNOSIS — Z79891 Long term (current) use of opiate analgesic: Secondary | ICD-10-CM | POA: Diagnosis not present

## 2022-11-24 DIAGNOSIS — T451X5D Adverse effect of antineoplastic and immunosuppressive drugs, subsequent encounter: Secondary | ICD-10-CM | POA: Diagnosis not present

## 2022-11-24 DIAGNOSIS — C3490 Malignant neoplasm of unspecified part of unspecified bronchus or lung: Secondary | ICD-10-CM

## 2022-11-24 DIAGNOSIS — Z515 Encounter for palliative care: Secondary | ICD-10-CM | POA: Diagnosis not present

## 2022-11-24 LAB — CBC WITH DIFFERENTIAL (CANCER CENTER ONLY)
Abs Immature Granulocytes: 0.12 10*3/uL — ABNORMAL HIGH (ref 0.00–0.07)
Basophils Absolute: 0 10*3/uL (ref 0.0–0.1)
Basophils Relative: 0 %
Eosinophils Absolute: 0.4 10*3/uL (ref 0.0–0.5)
Eosinophils Relative: 3 %
HCT: 33.8 % — ABNORMAL LOW (ref 39.0–52.0)
Hemoglobin: 11.1 g/dL — ABNORMAL LOW (ref 13.0–17.0)
Immature Granulocytes: 1 %
Lymphocytes Relative: 11 %
Lymphs Abs: 1.1 10*3/uL (ref 0.7–4.0)
MCH: 30.4 pg (ref 26.0–34.0)
MCHC: 32.8 g/dL (ref 30.0–36.0)
MCV: 92.6 fL (ref 80.0–100.0)
Monocytes Absolute: 1.1 10*3/uL — ABNORMAL HIGH (ref 0.1–1.0)
Monocytes Relative: 11 %
Neutro Abs: 7.8 10*3/uL — ABNORMAL HIGH (ref 1.7–7.7)
Neutrophils Relative %: 74 %
Platelet Count: 313 10*3/uL (ref 150–400)
RBC: 3.65 MIL/uL — ABNORMAL LOW (ref 4.22–5.81)
RDW: 14.5 % (ref 11.5–15.5)
WBC Count: 10.5 10*3/uL (ref 4.0–10.5)
nRBC: 0 % (ref 0.0–0.2)

## 2022-11-24 LAB — CMP (CANCER CENTER ONLY)
ALT: 9 U/L (ref 0–44)
AST: 14 U/L — ABNORMAL LOW (ref 15–41)
Albumin: 4 g/dL (ref 3.5–5.0)
Alkaline Phosphatase: 76 U/L (ref 38–126)
Anion gap: 5 (ref 5–15)
BUN: 12 mg/dL (ref 8–23)
CO2: 24 mmol/L (ref 22–32)
Calcium: 9.7 mg/dL (ref 8.9–10.3)
Chloride: 100 mmol/L (ref 98–111)
Creatinine: 1.15 mg/dL (ref 0.61–1.24)
GFR, Estimated: >60 mL/min (ref >60)
Glucose, Bld: 95 mg/dL (ref 70–99)
Potassium: 4.6 mmol/L (ref 3.5–5.1)
Sodium: 129 mmol/L — ABNORMAL LOW (ref 135–145)
Total Bilirubin: 0.5 mg/dL (ref <1.2)
Total Protein: 8.2 g/dL — ABNORMAL HIGH (ref 6.5–8.1)

## 2022-11-24 LAB — TSH: TSH: 4.835 u[IU]/mL — ABNORMAL HIGH (ref 0.350–4.500)

## 2022-11-24 MED ORDER — SODIUM CHLORIDE 0.9% FLUSH
10.0000 mL | Freq: Once | INTRAVENOUS | Status: AC
  Administered 2022-11-24: 10 mL

## 2022-11-24 MED ORDER — SODIUM CHLORIDE 0.9% FLUSH
10.0000 mL | Freq: Once | INTRAVENOUS | Status: AC
  Administered 2022-11-24: 10 mL via INTRAVENOUS

## 2022-11-24 MED ORDER — HEPARIN SOD (PORK) LOCK FLUSH 100 UNIT/ML IV SOLN
500.0000 [IU] | Freq: Once | INTRAVENOUS | Status: AC
  Administered 2022-11-24: 500 [IU] via INTRAVENOUS

## 2022-11-24 NOTE — Patient Instructions (Addendum)
-  We covered a lot of important information at your appointment today regarding what the treatment plan is moving forward. Here are the the main points that were discussed at your office visit with Terry Page today:   There are two treatment options.  First option:  -The treatment that you will receive consists of two chemotherapy drugs, called Docetaxel and Cyramza (Ramucirumab). This treatment may be more effective but may have more side effects.  -We are planning on starting your treatment on 12/08/22  -Your treatment will be given once every 3 weeks. We will check your labs once a week just to make sure that important components of your blood are in an acceptable range -You will receive an injection about 3 days after chemotherapy. The purpose of this injection is to boost your bodies infection fighting cells to protect you from getting an infection. Some patient's may feel achy after this injection because it makes your bone marrow work hard to make more infection fighting cells. Please take claritin daily for 4-7 days after the injection to help with the achiness. You may also take your pain medication.   Medications:  -I have sent a prescription for Decadron (Dexamethasone) to your pharmacy. This medication is a steroids. The purpose of this medication is because it is one if the pre-medications that is needed before you get treatment. Please read the instructions closely. Dr. Leonides Schanz will likely want you to take this the day before treatment and the day after getting chemotherapy.   Side effects: The side effects of treatment may include but are not limited to alopecia (losing your hair), myelosuppression (dropping your blood counts), nausea and vomiting, peripheral neuropathy (numbness in the hands and feet), liver or renal dysfunction as well as the adverse effect of Cyramza including increased risk for hemorrhage (bleeding) and GI perforation. Please seek medical attention if you develop concerning or  significant bleeding.

## 2022-11-24 NOTE — Progress Notes (Unsigned)
[facility] OFFICE PROGRESS NOTE  Aliene Beams, MD [phone] [address] [place] [address]  DIAGNOSIS:  # Metastatic Adenocarcinoma of the Lung  01/07/2020 : CT of the chest with contrast performed which showed interval enlargement of a spiculated nodule in the central left upper lobe measuring 1.2 x 1.0 cm and highly suspicious for primary lung malignancy 02/06/2020 :PET scan performed which showed a hypermetabolic irregular solid 1.3 cm left upper lobe pulmonary nodule compatible with malignancy without any other hypermetabolic lesions 01/06/7827- 04/19/2020:  SBRT to the lung lesion 04/28/2021: CT C/A/P showed multifocal lytic bone metastases are identified. Lesion within the spine of the left scapula and lesions involving bilateral iliac bones and left sacral wing. Establish care with Dr. Leonides Schanz  05/03/2021: biopsy of right iliac lytic lesion showed metastatic carcinoma, consistent with lung primary.  07/08/2021: Cycle 1 Day 1 of Carbo/Pem/Pem 07/29/2021: Cycle 2 Day 1 of Carbo/Pem/Pem 08/11/2021-08/24/2021: Received palliative radiation to osseous metastasis in the left shoulder. 30 Gy in 10 fractions.  08/19/2021: Cycle 3 Day 1 of Carbo/Pem/Pem 09/09/2021: Cycle 4 Day 1 of Carbo/Pem/Pem 09/23/2021: CT CAP: stable disease 09/30/2021: Cycle 5 Day 1 of Pem/Pem 10/21/2021: Cycle 6 Day 1 of Pembrolizumab. Held pemetrexed due to anemia. 11/16/2021: Cycle 7 Day 1 of Pem/Pem.   12/16/2021: Cycle 8 Day 1 of Pem/Pem.   01/06/2022: Cycle 9 Day 1 of Pem/Pem.  01/27/2022: Cycle 10 Day 1 of Pem/Pem 02/17/2022: Cycle 11 Day 1 of Pem/Pem 03/10/2022: Cycle 12 Day 1 of Pem/Pem (HELD due to anemia with Hgb of 6.4) 03/17/2022: Cycle 12 Day 1 of Pem/Pem (HELD pemextrexed due to cytopenias/kidney function) 04/10/2022: Cycle 13 Day 1 of Pem/Pem (HELD pemextrexed due to cytopenias/kidney function) 04/28/2022: Cycle 14 Day 1 of Pem/Pem (HELD pemextrexed due to cytopenias/kidney function) 05/23/2022:   Cycle 15 Day 1 of Pem/Pem (HELD pemextrexed due to cytopenias/kidney function) 06/15/2022: Cycle 16 Day 1 of Pembrolizumab.  07/07/2022: Cycle 17 Day 1 of Pembrolizumab.  07/28/2022: Cycle 18 Day 1 of Pembrolizumab 08/18/2022: Cycle 19 Day 1 of Pembrolizumab HELD due to hyponatremia of 118, new bone metastases. 08/25/2022: Cycle 19 Day 1 of Pembrolizumab 08/28/2022-09/13/2022: Received palliative radiation to right pelvis with total dose of 30 Gy in 10 Fx.  09/22/2022:  Cycle 20 Day 1 of Pembrolizumab 10/13/2022:  Cycle 21 Day 1 of Pembrolizumab 11/02/2022: Cycle 22 Day 1 of Pembrolizumab 11/24/2022: CT CAP shows disease progression with new new/enlarging pulmonary nodules, new right hepatic lobe lesion and new right renal lesion. Clinically, the patient also has new right cervical lymph node that is likely nodal metastasis.  12/08/22: Starting treatment with Docetaxel/Cyramza IV every 3 weeks    INTERVAL HISTORY: Terry Page. 67 y.o. male returns to the clinic today for a follow up visit. The patient is being treated for metastatic lung cancer. He is currently on single agent maintenance immunotherapy with Keytruda. Chemotherapy with Alimta was discontinued due to cytopenias/renal insuffiencey.   He tolerated his most recent cycle of immunotherapy well without any new adverse side effects.   Overall, the patient is feeling well today without any concerns except he noticed an enlarged right cervical lymph node. He noticed this after his recent CT scan on 11/17/22.  He denies any pain with the lymphadenopathy.  There is no overlying erythema or drainage.  He denies any skin infections, recent upper respiratory infections, or recent dental infections.  He denies fevers, chills, or night sweats. He reports a "good" appetite. His weight  is stable. He reports his breathing is "fine". He denies dyspnea on exertion, cough, or hemoptysis.  He denies any chest pain.  He denies any nausea, vomiting,  diarrhea, or constipation.  Denies any headache or visual changes.  Denies any rashes.  He is followed closely by palliative care for cancer related pain for which she is currently on tramadol.  He is scheduled to see them later today.  He is here today for evaluation and to review his scan results before considering starting his next cycle of treatment.    MEDICAL HISTORY: Past Medical History:  Diagnosis Date   Alcohol abuse    Asthma    Bullous emphysema ([name])    Essential hypertension 04/10/2020   Hemorrhoids    History of radiation therapy 04/08/20-04/19/20   IMRT- Left lung- Dr. Antony Blackbird   Incidental lung nodule, greater than or equal to 8mm 10/22/2017   Left upper lobe - discovered on CTA   Prostate cancer ([name])    Spontaneous pneumothorax 10/20/2017   right   Tobacco abuse     ALLERGIES:  is allergic to morphine and oxycodone.  MEDICATIONS:  Current Outpatient Medications  Medication Sig Dispense Refill   acetaminophen (TYLENOL) 325 MG tablet Take 2 tablets (650 mg total) by mouth every 6 (six) hours as needed for mild pain or headache (fever >/= 101).     albuterol (VENTOLIN HFA) 108 (90 Base) MCG/ACT inhaler Inhale 2 puffs into the lungs every 6 (six) hours as needed for wheezing or shortness of breath. 8 g 1   amLODipine (NORVASC) 10 MG tablet Take 1 tablet by mouth daily.     atorvastatin (LIPITOR) 20 MG tablet Take 20 mg by mouth daily.     cyanocobalamin (VITAMIN B12) 1000 MCG tablet Take 1 tablet (1,000 mcg total) by mouth daily. 90 tablet 1   ferrous sulfate 325 (65 FE) MG EC tablet Take 1 tablet (325 mg total) by mouth daily with breakfast. 30 tablet 3   folic acid (FOLVITE) 1 MG tablet Take 1 tablet (1 mg total) by mouth daily. 30 tablet 3   hydrOXYzine (ATARAX) 25 MG tablet Take 1 tablet (25 mg total) by mouth at bedtime as needed for itching. 30 tablet 11   ipratropium-albuterol (DUONEB) 0.5-2.5 (3) MG/3ML SOLN Take 3 mLs by nebulization every 6 (six) hours as  needed. (Patient taking differently: Take 3 mLs by nebulization every 6 (six) hours as needed (shortness of breathing or wheezing).) 360 mL 0   lidocaine (LIDODERM) 5 % Place 1 patch onto the skin daily. Remove & Discard patch within 12 hours or as directed by MD. Apply to low back 30 patch 0   lidocaine-prilocaine (EMLA) cream Apply 1 Application topically as needed. 30 g 0   losartan (COZAAR) 100 MG tablet Take 100 mg by mouth daily.     olmesartan (BENICAR) 40 MG tablet Take 40 mg by mouth daily.     ondansetron (ZOFRAN) 8 MG tablet Take 1 tablet (8 mg total) by mouth every 8 (eight) hours as needed. 30 tablet 0   polyethylene glycol (MIRALAX) 17 g packet Take 17 g by mouth daily. 30 each 2   potassium chloride SA (KLOR-CON M) 20 MEQ tablet TAKE 1 TABLET(20 MEQ) BY MOUTH DAILY 90 tablet 0   prochlorperazine (COMPAZINE) 10 MG tablet Take 1 tablet (10 mg total) by mouth every 6 (six) hours as needed for nausea or vomiting. 30 tablet 0   senna-docusate (STIMULANT LAXATIVE) 8.6-50 MG tablet Take  2 tablets by mouth 2 (two) times daily. May add additional tablet as needed 150 tablet 3   sodium chloride 1 g tablet Take 1 tablet (1 g total) by mouth 3 (three) times daily with meals. 90 tablet 3   thiamine 100 MG tablet Take 1 tablet (100 mg total) by mouth daily.     traMADol (ULTRAM) 50 MG tablet Take 1-2 tablets (50-100 mg total) by mouth every 6 (six) hours as needed. 120 tablet 0   No current facility-administered medications for this visit.    SURGICAL HISTORY:  Past Surgical History:  Procedure Laterality Date   BACK SURGERY     IR IMAGING GUIDED PORT INSERTION  06/29/2021   PROSTATE BIOPSY      REVIEW OF SYSTEMS:   Review of Systems  Constitutional: Negative for appetite change, chills, fatigue, fever and unexpected weight change.  HENT: Positive for enlarged right cervical lymph node. Negative for mouth sores, nosebleeds, sore throat and trouble swallowing.   Eyes: Negative for eye  problems and icterus.  Respiratory: Negative for cough, hemoptysis, shortness of breath and wheezing.   Cardiovascular: Negative for chest pain and leg swelling.  Gastrointestinal: Negative for abdominal pain, constipation, diarrhea, nausea and vomiting.  Genitourinary: Negative for bladder incontinence, difficulty urinating, dysuria, frequency and hematuria.   Musculoskeletal: Positive for stable back pain. Negative for gait problem, neck pain and neck stiffness.  Skin: Negative for itching and rash.  Neurological: Negative for dizziness, extremity weakness, gait problem, headaches, light-headedness and seizures.  Hematological: Negative for adenopathy. Does not bruise/bleed easily.  Psychiatric/Behavioral: Negative for confusion, depression and sleep disturbance. The patient is not nervous/anxious.     PHYSICAL EXAMINATION:  There were no vitals taken for this visit.  ECOG PERFORMANCE STATUS: 1  Physical Exam  Constitutional: Oriented to person, place, and time and well-developed, well-nourished, and in no distress. HENT:  Head: Normocephalic and atraumatic.  Mouth/Throat: Oropharynx is clear and moist. No oropharyngeal exudate.  Eyes: Conjunctivae are normal. Right eye exhibits no discharge. Left eye exhibits no discharge. No scleral icterus.  Neck: Normal range of motion. Neck supple.  Cardiovascular: Normal rate, regular rhythm, normal heart sounds and intact distal pulses.   Pulmonary/Chest: Effort normal and breath sounds normal. No respiratory distress. No wheezes. No rales.  Abdominal: Soft. Bowel sounds are normal. Exhibits no distension and no mass. There is no tenderness.  Musculoskeletal: Normal range of motion. Exhibits no edema.  Lymphadenopathy:  Pathologically enlarged right cervical lymph node.  Neurological: Alert and oriented to person, place, and time. Exhibits normal muscle tone. Gait normal. Coordination normal.  Skin: Skin is warm and dry. No rash noted. Not  diaphoretic. No erythema. No pallor.  Psychiatric: Mood, memory and judgment normal.  Vitals reviewed.  LABORATORY DATA: Lab Results  Component Value Date   WBC 10.5 11/24/2022   HGB 11.1 (L) 11/24/2022   HCT 33.8 (L) 11/24/2022   MCV 92.6 11/24/2022   PLT 313 11/24/2022      Chemistry      Component Value Date/Time   NA 129 (L) 11/24/2022 0931   K 4.6 11/24/2022 0931   CL 100 11/24/2022 0931   CO2 24 11/24/2022 0931   BUN 12 11/24/2022 0931   CREATININE 1.15 11/24/2022 0931      Component Value Date/Time   CALCIUM 9.7 11/24/2022 0931   ALKPHOS 76 11/24/2022 0931   AST 14 (L) 11/24/2022 0931   ALT 9 11/24/2022 0931   BILITOT 0.5 11/24/2022 0931  RADIOGRAPHIC STUDIES:  CT CHEST ABDOMEN PELVIS W CONTRAST  Result Date: 11/24/2022 CLINICAL DATA:  Non-small cell lung cancer, metastatic, assess treatment response. * Tracking Code: BO * EXAM: CT CHEST, ABDOMEN, AND PELVIS WITH CONTRAST TECHNIQUE: Multidetector CT imaging of the chest, abdomen and pelvis was performed following the standard protocol during bolus administration of intravenous contrast. RADIATION DOSE REDUCTION: This exam was performed according to the departmental dose-optimization program which includes automated exposure control, adjustment of the mA and/or kV according to patient size and/or use of iterative reconstruction technique. CONTRAST:  OMNIPAQUE IOHEXOL 300 MG/ML  SOLN COMPARISON:  08/09/2022. FINDINGS: CT CHEST FINDINGS Cardiovascular: Right IJ Port-A-Cath terminates at the SVC RA junction. Atherosclerotic calcification of the aorta, aortic valve and coronary arteries. Heart is at the upper limits of normal in size. No pericardial effusion. Mediastinum/Nodes: No pathologically enlarged mediastinal, hilar or axillary lymph nodes. Esophagus is grossly unremarkable. Lungs/Pleura: Centrilobular and paraseptal emphysema. New and enlarging bilateral pulmonary nodules. Index new 10 mm nodule in the  medial anterior segment right upper lobe (4/82). Index 6 mm right lower lobe nodule (4/90), enlarged from 4 mm. Scattered scarring. Bullous changes in the left upper lobe. Areas of post treatment architectural distortion in the apicoposterior and perihilar left upper lobe. No pleural fluid. Airway is unremarkable. Musculoskeletal: Degenerative changes in the spine. Similar mixed lytic and sclerotic lesion in the left scapula osteopenia. Old left anterolateral rib fracture. Slight compression of the T12 superior endplate, old. CT ABDOMEN PELVIS FINDINGS Hepatobiliary: New minimally hypoattenuating nodule in the inferior right hepatic lobe, along the gallbladder fossa, measures 16 mm (2/69). Multiple additional low-attenuation lesions in the liver, as before. Gallbladder is unremarkable. No biliary ductal dilatation. Pancreas: Negative. Spleen: Negative. Adrenals/Urinary Tract: Adrenal glands are unremarkable. New 10 mm hypodense lesion along the lateral upper pole right kidney (2/65). Additional low-attenuation lesions in the kidneys, as before. Ureters are decompressed. Bladder is grossly unremarkable. Stomach/Bowel: Stomach, small bowel, appendix and colon are unremarkable. Vascular/Lymphatic: Atherosclerotic calcification of the aorta. No pathologically enlarged lymph nodes. Reproductive: Prostate is visualized. Other: Mild presacral edema. No free fluid. Mesenteries and peritoneum are unremarkable. Musculoskeletal: Mixed lytic and sclerotic lesions throughout the pelvis, as before. Degenerative changes in the spine. IMPRESSION: 1. Interval progression of metastatic disease as evidenced by new/enlarging pulmonary nodules, new right hepatic lobe lesion and new right renal lesion. 2. Grossly stable osseous metastatic disease. 3. Aortic atherosclerosis (ICD10-I70.0). Coronary artery calcification. 4.  Emphysema (ICD10-J43.9). Electronically Signed   By: Leanna Battles M.D.   On: 11/24/2022 09:21      ASSESSMENT/PLAN:  Terry Page. Is a 67 y.o.  male with medical history significant for metastatic adenocarcinoma of the lung who presents for a follow up visit.   # Metastatic Adenocarcinoma of the Lung  -- NGS testing from this patient shows no evidence of targetable mutation. --MRI brain shows no evidence of intracranial metastases --Started carboplatin, pembrolizumab, and pemetrexed as treatment for his cancer on 07/08/2021. --Received palliative radiation to osseous metastasis of the left shoulder from 08/11/2021-08/24/2021. Planned dose is 30 Gy in 10 fx.  --Discontinued Pemetrexed on 03/17/2022 due to persistent anemia and kidney dysfunction. --Restaging CT CAP from 08/09/2022 shows interval enlargement of multiple mixed lytic and sclerotic bone metastases involving the right pelvis. Other bone metastases including of the left scapula, sacrum, and left hemipelvis are unchanged. Unchanged treated central LUL mass. Recommend to continue on pembrolizumab therapy and arrange for palliative radiation to enlarging bone metastases.  --Restaging CT  CAP from 11/17/22 showed interval progression of metastatic disease as evidenced by new/enlarging pulmonary nodules, new right hepatic lobe lesion and new right renal lesion. From Physical Exam, he also has new enlarged right cervical lymph node that is likely nodal metastasis.  -- The patient was seen with Dr. Leonides Schanz today.  Dr. Leonides Schanz reviewed the scan discussed options. --Dr. Leonides Schanz would recommend changing treatment.  The options include docetaxel and Cyramza with Neulasta support IV every 3 weeks versus single agent gemcitabine.  Docetaxel and Cyramza likely would be more effective but may have more toxicities.  -- After discussion, the plan is to start docetaxel and Cyramza on 12/08/2022 --Dr. Leonides Schanz may reduce his dose of chemotherapy due to cytopenias with chemotherapy in the past -- The adverse side effects of treatment were discussed including  but not limited to fatigue, alopecia, myelosuppression, peripheral neuropathy, kidney, liver dysfunction, nausea or vomiting and the risks with Cyramza including hypertension, proteinuria, risk of bleeding, and GI perforation --The patient was advised to take Claritin 4 to 7 days after receiving Neulasta injections. --We will monitor his lab work closely weekly with the first cycle of treatment given his history of cytopenias. --I will see the patient back for follow-up visit on day 1 cycle 1.  --I discussed with the patient that we will send premedications to his pharmacy which will include Decadron.  I let the patient know he will need to take Decadron premedication the day before and following treatment.  Dr. Leonides Schanz or I will release these from the care plan once it is entered. --Dr. Leonides Schanz does not feel that the patient needs dedicated CT scan of the neck for the cervical lymph node since we know that the patient has progressive disease on his recent CT chest abdomen and pelvis and it does not change the management plan. Plan: --labs from today were reviewed. Labs show white blood cell count 10.5, hemoglobin 11.1, MCV 92.6, and platelets of 313. Creatinine and LFTs normal.  -- Seen with Dr. Leonides Schanz today (11/24/22). Changed treatment to docetaxel and Cyramza with Neulasta support.  First dose estimated on 12/08/2022. Dr. Leonides Schanz may reduce the dose of chemotherapy due to cytopenias with chemotherapy in the past --Will send Decadron and antiemetics to pharmacy once care plan is entered. Instructed to take claritin for 4-7 days after neulasta injection due to possible arthralgias --Follow-up with the patient on 12/08/2022 prior to starting day 1 cycle 1   #Right hip pain: --Secondary progressive bone metastases in right pelvis seen on CT imaging from 08/09/2022 --Received palliative radiation to the right pelvis from 08/28/2022-09/13/2022 with total dose of 30 Gy in 10 Fx.  --Pain is slightly improved but  still present.  --Continue to take tramadol q 6 hours for pain control.  --He is scheduled to see palliative care today   #Hyponatremia: --Chronic in nature --Sodium level dropped from 127 on 08/09/2022 to 118.  Today sodium stable at 129 --Etiologies including SIADH, immunotherapy induced, too much water intake.  --Patient is asymptomatic without nausea/vomiting, headaches, confusion, seizures, etc --Currently on salt tablets 2 gm TID daily.  --Strict ED precautions given for any new symptoms as discussed above.    # Iron Deficiency Anemia #Chemotherapy induced anemia --Hgb level 11.1 today.  --Currently on ferrous sulfate 325 mg PO daily   #Pain Control --stopped oxycodone due to itching --currently following with Palliative care.  --pain well controlled with tramadol as needed. Continue to take tramdol 50-100 mg q 6 hours as needed. --Continue senna  docusate for constipation prophylaxis --Continue to monitor --Scheduled to see palliative care today   #Supportive Care -- chemotherapy education complete -- port placed -- zofran 8mg  q8H PRN and compazine 10mg  PO q6H for nausea -- EMLA cream for port -- Patient referred to palliative care for pain control. Pain control as above.    No orders of the defined types were placed in this encounter.    Daray Polgar L Nycholas Rayner, PA-C 11/28/22  I have read the above note and personally examined the patient. I agree with the assessment and plan as noted above.  Briefly Mr. Brinser is a 67 year old male with medical history significant for metastatic adenocarcinoma of the lung who presents for follow-up visit.  Unfortunately today he was found to have progression of disease on his most recent CT scan.  Given this progression of disease we will need to discontinue his current therapy pemetrexed and pembrolizumab and transition instead to a new line of chemotherapy.  At this time would recommend docetaxel and remdesivir.  Today we discussed  the risks and benefits of this treatment and the patient voices understanding was willing and able to proceed.  Will plan to start the treatment as early as 12/08/2022.   Ulysees Barns, MD Department of Hematology/Oncology [facility] Phone: [phone] Pager: [phone] Email: [email or web address]

## 2022-11-26 LAB — T4: T4, Total: 5.8 ug/dL (ref 4.5–12.0)

## 2022-11-28 ENCOUNTER — Encounter: Payer: Self-pay | Admitting: Hematology and Oncology

## 2022-11-28 NOTE — Progress Notes (Signed)
DISCONTINUE ON PATHWAY REGIMEN - Non-Small Cell Lung     A cycle is every 21 days:     Pembrolizumab      Pemetrexed      Carboplatin   **Always confirm dose/schedule in your pharmacy ordering system**  REASON: Disease Progression PRIOR TREATMENT: [id number]: Pembrolizumab 200 mg + Pemetrexed 500 mg/m2 + Carboplatin AUC=5 q21 Days x 4 Cycles TREATMENT RESPONSE: Partial Response (PR)  START OFF PATHWAY REGIMEN - Non-Small Cell Lung   [id number]:Docetaxel 75 mg/m2 IV D1 + Ramucirumab 10 mg/kg IV D1 q21 Days:   A cycle is every 21 days:     Ramucirumab      Docetaxel   **Always confirm dose/schedule in your pharmacy ordering system**  Patient Characteristics: Stage IV Metastatic, Nonsquamous, Molecular Analysis Completed, Molecular Alteration Present and Targeted Therapy Exhausted OR KRAS G12C+ or HER2+ Present and No Prior Chemo/Immunotherapy OR No Alteration Present, Second Line -  Chemotherapy/Immunotherapy, PS = 2, Prior PD-1/PD-L1 Inhibitor + Platinum-Based Chemotherapy or No Prior PD-1/PD-L1 Inhibitor and Not a Candidate for Immunotherapy Therapeutic Status: Stage IV Metastatic Histology: Nonsquamous Cell Broad Molecular Profiling Status: Animal nutritionist Analysis Results: No Alteration Present ECOG Performance Status: 2 Chemotherapy/Immunotherapy Line of Therapy: Second Line Chemotherapy/Immunotherapy Immunotherapy Candidate Status: Not a Candidate for Immunotherapy Prior Immunotherapy Status: Prior PD-1/PD-L1 Inhibitor + Platinum-Based Chemotherapy Intent of Therapy: Non-Curative / Palliative Intent, Discussed with Patient

## 2022-11-29 ENCOUNTER — Telehealth: Payer: Self-pay | Admitting: Hematology and Oncology

## 2022-11-29 ENCOUNTER — Other Ambulatory Visit: Payer: Self-pay | Admitting: Physician Assistant

## 2022-11-29 ENCOUNTER — Other Ambulatory Visit: Payer: Self-pay | Admitting: *Deleted

## 2022-11-29 ENCOUNTER — Encounter: Payer: Self-pay | Admitting: Hematology and Oncology

## 2022-11-29 DIAGNOSIS — C3412 Malignant neoplasm of upper lobe, left bronchus or lung: Secondary | ICD-10-CM

## 2022-11-29 MED ORDER — ONDANSETRON HCL 8 MG PO TABS: 8.0000 mg | ORAL_TABLET | Freq: Three times a day (TID) | ORAL | 1 refills | Status: DC | PRN

## 2022-11-29 MED ORDER — DEXAMETHASONE 4 MG PO TABS: ORAL_TABLET | ORAL | 1 refills | Status: DC

## 2022-11-29 MED ORDER — SENNOSIDES-DOCUSATE SODIUM 8.6-50 MG PO TABS: 2.0000 | ORAL_TABLET | Freq: Two times a day (BID) | ORAL | 3 refills | Status: DC

## 2022-11-29 MED ORDER — PROCHLORPERAZINE MALEATE 10 MG PO TABS: 10.0000 mg | ORAL_TABLET | Freq: Four times a day (QID) | ORAL | 1 refills | Status: DC | PRN

## 2022-11-29 NOTE — Telephone Encounter (Signed)
Received call from pt regarding his upcoming new treatment plan. Reviewed home meds, specifically his dexamethasone.  He is to take this the day before and the day after treatment. He will get dex here on his treatment day. Pt voiced understanding.  Refilled Senna-s

## 2022-12-04 NOTE — Progress Notes (Signed)

## 2022-12-11 ENCOUNTER — Encounter: Payer: Self-pay | Admitting: Hematology and Oncology

## 2022-12-11 ENCOUNTER — Inpatient Hospital Stay: Payer: Medicare HMO | Attending: Hematology and Oncology

## 2022-12-11 ENCOUNTER — Inpatient Hospital Stay: Payer: Medicare HMO

## 2022-12-11 VITALS — BP 146/87 | HR 87 | Temp 97.8°F | Resp 18 | Ht 69.0 in | Wt 147.5 lb

## 2022-12-11 DIAGNOSIS — Z79891 Long term (current) use of opiate analgesic: Secondary | ICD-10-CM | POA: Diagnosis not present

## 2022-12-11 DIAGNOSIS — C7951 Secondary malignant neoplasm of bone: Secondary | ICD-10-CM | POA: Diagnosis not present

## 2022-12-11 DIAGNOSIS — Z5189 Encounter for other specified aftercare: Secondary | ICD-10-CM | POA: Insufficient documentation

## 2022-12-11 DIAGNOSIS — C61 Malignant neoplasm of prostate: Secondary | ICD-10-CM | POA: Insufficient documentation

## 2022-12-11 DIAGNOSIS — D6481 Anemia due to antineoplastic chemotherapy: Secondary | ICD-10-CM | POA: Insufficient documentation

## 2022-12-11 DIAGNOSIS — Z5112 Encounter for antineoplastic immunotherapy: Secondary | ICD-10-CM | POA: Insufficient documentation

## 2022-12-11 DIAGNOSIS — C3412 Malignant neoplasm of upper lobe, left bronchus or lung: Secondary | ICD-10-CM

## 2022-12-11 DIAGNOSIS — G893 Neoplasm related pain (acute) (chronic): Secondary | ICD-10-CM | POA: Insufficient documentation

## 2022-12-11 DIAGNOSIS — Z923 Personal history of irradiation: Secondary | ICD-10-CM | POA: Insufficient documentation

## 2022-12-11 DIAGNOSIS — Z5111 Encounter for antineoplastic chemotherapy: Secondary | ICD-10-CM | POA: Insufficient documentation

## 2022-12-11 DIAGNOSIS — Z87891 Personal history of nicotine dependence: Secondary | ICD-10-CM | POA: Diagnosis not present

## 2022-12-11 DIAGNOSIS — T451X5D Adverse effect of antineoplastic and immunosuppressive drugs, subsequent encounter: Secondary | ICD-10-CM | POA: Diagnosis not present

## 2022-12-11 DIAGNOSIS — I10 Essential (primary) hypertension: Secondary | ICD-10-CM | POA: Diagnosis not present

## 2022-12-11 DIAGNOSIS — E871 Hypo-osmolality and hyponatremia: Secondary | ICD-10-CM | POA: Insufficient documentation

## 2022-12-11 LAB — CMP (CANCER CENTER ONLY)
ALT: 9 U/L (ref 0–44)
AST: 15 U/L (ref 15–41)
Albumin: 4.2 g/dL (ref 3.5–5.0)
Alkaline Phosphatase: 83 U/L (ref 38–126)
Anion gap: 10 (ref 5–15)
BUN: 14 mg/dL (ref 8–23)
CO2: 22 mmol/L (ref 22–32)
Calcium: 9.9 mg/dL (ref 8.9–10.3)
Chloride: 96 mmol/L — ABNORMAL LOW (ref 98–111)
Creatinine: 1.17 mg/dL (ref 0.61–1.24)
GFR, Estimated: >60 mL/min (ref >60)
Glucose, Bld: 153 mg/dL — ABNORMAL HIGH (ref 70–99)
Potassium: 4 mmol/L (ref 3.5–5.1)
Sodium: 128 mmol/L — ABNORMAL LOW (ref 135–145)
Total Bilirubin: 0.3 mg/dL (ref <1.2)
Total Protein: 9 g/dL — ABNORMAL HIGH (ref 6.5–8.1)

## 2022-12-11 LAB — TOTAL PROTEIN, URINE DIPSTICK: Protein, ur: NEGATIVE mg/dL

## 2022-12-11 LAB — CBC WITH DIFFERENTIAL (CANCER CENTER ONLY)
Abs Immature Granulocytes: 0 10*3/uL (ref 0.00–0.07)
Band Neutrophils: 3 %
Basophils Absolute: 0 10*3/uL (ref 0.0–0.1)
Basophils Relative: 0 %
Eosinophils Absolute: 0 10*3/uL (ref 0.0–0.5)
Eosinophils Relative: 0 %
HCT: 37 % — ABNORMAL LOW (ref 39.0–52.0)
Hemoglobin: 12.2 g/dL — ABNORMAL LOW (ref 13.0–17.0)
Lymphocytes Relative: 4 %
Lymphs Abs: 0.7 10*3/uL (ref 0.7–4.0)
MCH: 30.2 pg (ref 26.0–34.0)
MCHC: 33 g/dL (ref 30.0–36.0)
MCV: 91.6 fL (ref 80.0–100.0)
Monocytes Absolute: 0.2 10*3/uL (ref 0.1–1.0)
Monocytes Relative: 1 %
Neutro Abs: 17.7 10*3/uL — ABNORMAL HIGH (ref 1.7–7.7)
Neutrophils Relative %: 92 %
Platelet Count: 354 10*3/uL (ref 150–400)
RBC: 4.04 MIL/uL — ABNORMAL LOW (ref 4.22–5.81)
RDW: 13.9 % (ref 11.5–15.5)
Smear Review: NORMAL
WBC Count: 18.6 10*3/uL — ABNORMAL HIGH (ref 4.0–10.5)
nRBC: 0 % (ref 0.0–0.2)

## 2022-12-11 LAB — TSH: TSH: 0.538 u[IU]/mL (ref 0.350–4.500)

## 2022-12-11 MED ORDER — DEXAMETHASONE SODIUM PHOSPHATE 10 MG/ML IJ SOLN
10.0000 mg | Freq: Once | INTRAMUSCULAR | Status: AC
  Administered 2022-12-11: 10 mg via INTRAVENOUS
  Filled 2022-12-11: qty 1

## 2022-12-11 MED ORDER — HEPARIN SOD (PORK) LOCK FLUSH 100 UNIT/ML IV SOLN
500.0000 [IU] | Freq: Once | INTRAVENOUS | Status: AC | PRN
  Administered 2022-12-11: 500 [IU]

## 2022-12-11 MED ORDER — DIPHENHYDRAMINE HCL 50 MG/ML IJ SOLN
50.0000 mg | Freq: Once | INTRAMUSCULAR | Status: AC
  Administered 2022-12-11: 50 mg via INTRAVENOUS
  Filled 2022-12-11: qty 1

## 2022-12-11 MED ORDER — SODIUM CHLORIDE 0.9 % IV SOLN
75.0000 mg/m2 | Freq: Once | INTRAVENOUS | Status: AC
  Administered 2022-12-11: 136 mg via INTRAVENOUS
  Filled 2022-12-11: qty 13.6

## 2022-12-11 MED ORDER — RAMUCIRUMAB CHEMO INJECTION 500 MG/50ML
10.0000 mg/kg | Freq: Once | INTRAVENOUS | Status: AC
  Administered 2022-12-11: 700 mg via INTRAVENOUS
  Filled 2022-12-11: qty 20

## 2022-12-11 MED ORDER — SODIUM CHLORIDE 0.9 % IV SOLN
INTRAVENOUS | Status: DC
Start: 2022-12-11 — End: 2022-12-11

## 2022-12-11 MED ORDER — SODIUM CHLORIDE 0.9% FLUSH
10.0000 mL | INTRAVENOUS | Status: DC | PRN
  Administered 2022-12-11: 10 mL

## 2022-12-11 MED ORDER — ACETAMINOPHEN 325 MG PO TABS
650.0000 mg | ORAL_TABLET | Freq: Once | ORAL | Status: AC
  Administered 2022-12-11: 650 mg via ORAL
  Filled 2022-12-11: qty 2

## 2022-12-11 NOTE — Progress Notes (Signed)
Per Dr. Candise Che, ok for treatment today with elevated heart rate. Pt. denies chest pain, dizziness, no SOB noted.

## 2022-12-11 NOTE — Patient Instructions (Signed)
[facility] - A DEPT OF [facility]  Discharge Instructions: Thank you for choosing [facility] to provide your oncology and hematology care.   If you have a lab appointment with the Cancer Center, please go directly to the Cancer Center and check in at the registration area.   Wear comfortable clothing and clothing appropriate for easy access to any Portacath or PICC line.   We strive to give you quality time with your provider. You may need to reschedule your appointment if you arrive late (15 or more minutes).  Arriving late affects you and other patients whose appointments are after yours.  Also, if you miss three or more appointments without notifying the office, you may be dismissed from the clinic at the provider's discretion.      For prescription refill requests, have your pharmacy contact our office and allow 72 hours for refills to be completed.    Today you received the following chemotherapy and/or immunotherapy agents: Ramucirumab (Cyramza) and Docetaxel (Taxotere)      To help prevent nausea and vomiting after your treatment, we encourage you to take your nausea medication as directed.  BELOW ARE SYMPTOMS THAT SHOULD BE REPORTED IMMEDIATELY: *FEVER GREATER THAN 100.4 F (38 C) OR HIGHER *CHILLS OR SWEATING *NAUSEA AND VOMITING THAT IS NOT CONTROLLED WITH YOUR NAUSEA MEDICATION *UNUSUAL SHORTNESS OF BREATH *UNUSUAL BRUISING OR BLEEDING *URINARY PROBLEMS (pain or burning when urinating, or frequent urination) *BOWEL PROBLEMS (unusual diarrhea, constipation, pain near the anus) TENDERNESS IN MOUTH AND THROAT WITH OR WITHOUT PRESENCE OF ULCERS (sore throat, sores in mouth, or a toothache) UNUSUAL RASH, SWELLING OR PAIN  UNUSUAL VAGINAL DISCHARGE OR ITCHING   Items with * indicate a potential emergency and should be followed up as soon as possible or go to the Emergency Department if any problems should occur.  Please show the  CHEMOTHERAPY ALERT CARD or IMMUNOTHERAPY ALERT CARD at check-in to the Emergency Department and triage nurse.  Should you have questions after your visit or need to cancel or reschedule your appointment, please contact [facility] - A DEPT OF [facility]  Dept: [phone]  and follow the prompts.  Office hours are 8:00 a.m. to 4:30 p.m. Monday - Friday. Please note that voicemails left after 4:00 p.m. may not be returned until the following business day.  We are closed weekends and major holidays. You have access to a nurse at all times for urgent questions. Please call the main number to the clinic Dept: [phone] and follow the prompts.   For any non-urgent questions, you may also contact your provider using MyChart. We now offer e-Visits for anyone 70 and older to request care online for non-urgent symptoms. For details visit [email or web address].   Also download the MyChart app! Go to the app store, search "MyChart", open the app, select [facility], and log in with your MyChart username and password.  Ramucirumab Injection What is this medication? RAMUCIRUMAB (ra mue SIR ue mab) treats some types of cancer. It works by blocking a protein that causes cancer cells to grow and multiply. This helps to slow or stop the spread of cancer cells. It is a monoclonal antibody. This medicine may be used for other purposes; ask your health care provider or pharmacist if you have questions. COMMON BRAND NAME(S): Cyramza What should I tell my care team before I take this medication? They need to know if you  have any of these conditions: Blood clots Having or recent surgery Heart attack High blood pressure History of a tear in your stomach or intestines Liver disease Protein in your urine Stomach bleeding Stroke Thyroid disease An unusual or allergic reaction to ramucirumab, other medications, foods, dyes, or preservatives Pregnant or trying to get  pregnant Breast-feeding How should I use this medication? This medication is injected into a vein. It is given by your care team in a hospital or clinic setting. Talk to your care team about the use of this medication in children. Special care may be needed. Overdosage: If you think you have taken too much of this medicine contact a poison control center or emergency room at once. NOTE: This medicine is only for you. Do not share this medicine with others. What if I miss a dose? Keep appointments for follow-up doses. It is important not to miss your dose. Call your care team if you are unable to keep an appointment. What may interact with this medication? Interactions have not been studied. This list may not describe all possible interactions. Give your health care provider a list of all the medicines, herbs, non-prescription drugs, or dietary supplements you use. Also tell them if you smoke, drink alcohol, or use illegal drugs. Some items may interact with your medicine. What should I watch for while using this medication? Your condition will be monitored carefully while you are receiving this medication. You may need blood work while taking this medication. This medication may make you feel generally unwell. This is not uncommon as chemotherapy can affect health cells as well as cancer cells. Report any side effects. Continue your course of treatment even though you feel ill unless your care team tells you to stop. This medication may increase your risk to bruise or bleed. Call your care team if you notice any unusual bleeding. Before having surgery, talk to your care team to make sure it is ok. This medication can increase the risk of poor healing of your surgical site or wound. You will need to stop this medication for 28 days before surgery. After surgery, wait at least 2 weeks before restarting this medication. Make sure the surgical site or wound is healed enough before restarting this  medication. Talk to your care team if questions. Talk to your care team if you may be pregnant. Serious birth defects can occur if you take this medication during pregnancy and for 3 months after the last dose. You will need a negative pregnancy test before starting this medication. Contraception is recommended while taking this medication and for 3 months after the last dose. Your care team can help you find the option that works for you. Do not breastfeed while taking this medication and for 2 months after the last dose. This medication may cause infertility. Talk to your care team if you are concerned about your fertility. What side effects may I notice from receiving this medication? Side effects that you should report to your care team as soon as possible: Allergic reactions--skin rash, itching, hives, swelling of the face, lips, tongue, or throat Bleeding--bloody or black, tar-like stools, vomiting blood or brown material that looks like coffee grounds, red or dark brown urine, small red or purple spots on skin, unusual bruising or bleeding Dizziness, loss of balance or coordination, confusion or trouble speaking Heart attack--pain or tightness in the chest, shoulders, arms, or jaw, nausea, shortness of breath, cold or clammy skin, feeling faint or lightheaded Increase in blood pressure  Infection--fever, chills, cough, sore throat, wounds that don't heal, pain or trouble when passing urine, general feeling of discomfort or being unwell Infusion reactions--chest pain, shortness of breath or trouble breathing, feeling faint or lightheaded Kidney injury--decrease in the amount of urine, swelling of the ankles, hands, or feet Liver injury--right upper belly pain, loss of appetite, nausea, light-colored stool, dark yellow or brown urine, yellowing skin or eyes, unusual weakness or fatigue Low thyroid levels (hypothyroidism)--unusual weakness or fatigue, increased sensitivity to cold, constipation,  hair loss, dry skin, weight gain, feelings of depression Stomach pain that is severe, does not go away, or gets worse Stroke--sudden numbness or weakness of the face, arm, or leg, trouble speaking, confusion, trouble walking, loss of balance or coordination, dizziness, severe headache, change in vision Sudden and severe headache, confusion, change in vision, seizures, which may be signs of posterior reversible encephalopathy syndrome (PRES) Side effects that usually do not require medical attention (report to your care team if they continue or are bothersome): Diarrhea Fatigue Stomach pain Swelling of the ankles, hands, or feet This list may not describe all possible side effects. Call your doctor for medical advice about side effects. You may report side effects to FDA at 1-800-FDA-1088. Where should I keep my medication? This medication is given in a hospital or clinic. It will not be stored at home. NOTE: This sheet is a summary. It may not cover all possible information. If you have questions about this medicine, talk to your doctor, pharmacist, or health care provider.  2024 Elsevier/Gold Standard (2021-05-12 00:00:00) Docetaxel Injection What is this medication? DOCETAXEL (doe se TAX el) treats some types of cancer. It works by slowing down the growth of cancer cells. This medicine may be used for other purposes; ask your health care provider or pharmacist if you have questions. COMMON BRAND NAME(S): Docefrez, Docivyx, Taxotere What should I tell my care team before I take this medication? They need to know if you have any of these conditions: Kidney disease Liver disease Low white blood cell levels Tingling of the fingers or toes or other nerve disorder An unusual or allergic reaction to docetaxel, polysorbate 80, other medications, foods, dyes, or preservatives Pregnant or trying to get pregnant Breast-feeding How should I use this medication? This medication is injected into a  vein. It is given by your care team in a hospital or clinic setting. Talk to your care team about the use of this medication in children. Special care may be needed. Overdosage: If you think you have taken too much of this medicine contact a poison control center or emergency room at once. NOTE: This medicine is only for you. Do not share this medicine with others. What if I miss a dose? Keep appointments for follow-up doses. It is important not to miss your dose. Call your care team if you are unable to keep an appointment. What may interact with this medication? Do not take this medication with any of the following: Live virus vaccines This medication may also interact with the following: Certain antibiotics, such as clarithromycin, telithromycin Certain antivirals for HIV or hepatitis Certain medications for fungal infections, such as itraconazole, ketoconazole, voriconazole Grapefruit juice Nefazodone Supplements, such as St. John's wort This list may not describe all possible interactions. Give your health care provider a list of all the medicines, herbs, non-prescription drugs, or dietary supplements you use. Also tell them if you smoke, drink alcohol, or use illegal drugs. Some items may interact with your medicine. What should  I watch for while using this medication? This medication may make you feel generally unwell. This is not uncommon as chemotherapy can affect healthy cells as well as cancer cells. Report any side effects. Continue your course of treatment even though you feel ill unless your care team tells you to stop. You may need blood work done while you are taking this medication. This medication can cause serious side effects and infusion reactions. To reduce the risk, your care team may give you other medications to take before receiving this one. Be sure to follow the directions from your care team. This medication may increase your risk of getting an infection. Call your  care team for advice if you get a fever, chills, sore throat, or other symptoms of a cold or flu. Do not treat yourself. Try to avoid being around people who are sick. Avoid taking medications that contain aspirin, acetaminophen, ibuprofen, naproxen, or ketoprofen unless instructed by your care team. These medications may hide a fever. Be careful brushing or flossing your teeth or using a toothpick because you may get an infection or bleed more easily. If you have any dental work done, tell your dentist you are receiving this medication. Some products may contain alcohol. Ask your care team if this medication contains alcohol. Be sure to tell all care teams you are taking this medicine. Certain medications, like metronidazole and disulfiram, can cause an unpleasant reaction when taken with alcohol. The reaction includes flushing, headache, nausea, vomiting, sweating, and increased thirst. The reaction can last from 30 minutes to several hours. This medication may affect your coordination, reaction time, or judgement. Do not drive or operate machinery until you know how this medication affects you. Sit up or stand slowly to reduce the risk of dizzy or fainting spells. Drinking alcohol with this medication can increase the risk of these side effects. Talk to your care team about your risk of cancer. You may be more at risk for certain types of cancer if you take this medication. Talk to your care team if you wish to become pregnant or think you might be pregnant. This medication can cause serious birth defects if taken during pregnancy or if you get pregnant within 2 months after stopping therapy. A negative pregnancy test is required before starting this medication. A reliable form of contraception is recommended while taking this medication and for 2 months after stopping it. Talk to your care team about reliable forms of contraception. Do not breast-feed while taking this medication and for 1 week after  stopping therapy. Use a condom during sex and for 4 months after stopping therapy. Tell your care team right away if you think your partner might be pregnant. This medication can cause serious birth defects. This medication may cause infertility. Talk to your care team if you are concerned about your fertility. What side effects may I notice from receiving this medication? Side effects that you should report to your care team as soon as possible: Allergic reactions--skin rash, itching, hives, swelling of the face, lips, tongue, or throat Change in vision such as blurry vision, seeing halos around lights, vision loss Infection--fever, chills, cough, or sore throat Infusion reactions--chest pain, shortness of breath or trouble breathing, feeling faint or lightheaded Low red blood cell level--unusual weakness or fatigue, dizziness, headache, trouble breathing Pain, tingling, or numbness in the hands or feet Painful swelling, warmth, or redness of the skin, blisters or sores at the infusion site Redness, blistering, peeling, or loosening of the  skin, including inside the mouth Sudden or severe stomach pain, bloody diarrhea, fever, nausea, vomiting Swelling of the ankles, hands, or feet Tumor lysis syndrome (TLS)--nausea, vomiting, diarrhea, decrease in the amount of urine, dark urine, unusual weakness or fatigue, confusion, muscle pain or cramps, fast or irregular heartbeat, joint pain Unusual bruising or bleeding Side effects that usually do not require medical attention (report to your care team if they continue or are bothersome): Change in nail shape, thickness, or color Change in taste Hair loss Increased tears This list may not describe all possible side effects. Call your doctor for medical advice about side effects. You may report side effects to FDA at 1-800-FDA-1088. Where should I keep my medication? This medication is given in a hospital or clinic. It will not be stored at  home. NOTE: This sheet is a summary. It may not cover all possible information. If you have questions about this medicine, talk to your doctor, pharmacist, or health care provider.  2024 Elsevier/Gold Standard (2021-02-24 00:00:00)

## 2022-12-12 ENCOUNTER — Encounter: Payer: Self-pay | Admitting: Hematology and Oncology

## 2022-12-12 ENCOUNTER — Other Ambulatory Visit: Payer: Self-pay | Admitting: Hematology and Oncology

## 2022-12-12 DIAGNOSIS — C3412 Malignant neoplasm of upper lobe, left bronchus or lung: Secondary | ICD-10-CM

## 2022-12-12 LAB — T4: T4, Total: 6.2 ug/dL (ref 4.5–12.0)

## 2022-12-12 NOTE — Telephone Encounter (Signed)
-----   Message from Nurse Dillard Essex sent at 12/11/2022  2:39 PM EST ----- Regarding: Dr. Leonides Schanz Pt. first time Cyramza and Docetaxel. Tolerated well, no issues noted. Thank you and have a great day!

## 2022-12-12 NOTE — Telephone Encounter (Signed)
Called & left message on identified vm to call back to let us know how he did with his treatment.

## 2022-12-13 ENCOUNTER — Encounter: Payer: Self-pay | Admitting: Hematology and Oncology

## 2022-12-13 ENCOUNTER — Inpatient Hospital Stay (HOSPITAL_BASED_OUTPATIENT_CLINIC_OR_DEPARTMENT_OTHER): Payer: Medicare HMO | Admitting: Hematology and Oncology

## 2022-12-13 ENCOUNTER — Inpatient Hospital Stay: Payer: Medicare HMO

## 2022-12-13 VITALS — BP 127/82 | HR 100 | Temp 97.5°F | Resp 19 | Wt 146.6 lb

## 2022-12-13 DIAGNOSIS — G893 Neoplasm related pain (acute) (chronic): Secondary | ICD-10-CM | POA: Diagnosis not present

## 2022-12-13 DIAGNOSIS — Z95828 Presence of other vascular implants and grafts: Secondary | ICD-10-CM | POA: Diagnosis not present

## 2022-12-13 DIAGNOSIS — Z5112 Encounter for antineoplastic immunotherapy: Secondary | ICD-10-CM | POA: Diagnosis not present

## 2022-12-13 DIAGNOSIS — C3412 Malignant neoplasm of upper lobe, left bronchus or lung: Secondary | ICD-10-CM

## 2022-12-13 MED ORDER — PEGFILGRASTIM-JMDB 6 MG/0.6ML ~~LOC~~ SOSY
6.0000 mg | PREFILLED_SYRINGE | Freq: Once | SUBCUTANEOUS | Status: AC
  Administered 2022-12-13: 6 mg via SUBCUTANEOUS

## 2022-12-13 NOTE — Progress Notes (Signed)
[facility] [phone]   [phone]  PROGRESS NOTE  Patient Care Team: Aliene Beams, MD as PCP - General (Family Medicine)  Hematological/Oncological History # Metastatic Adenocarcinoma of the Lung  01/07/2020 : CT of the chest with contrast performed which showed interval enlargement of a spiculated nodule in the central left upper lobe measuring 1.2 x 1.0 cm and highly suspicious for primary lung malignancy 02/06/2020 :PET scan performed which showed a hypermetabolic irregular solid 1.3 cm left upper lobe pulmonary nodule compatible with malignancy without any other hypermetabolic lesions 05/07/2128- 04/19/2020:  SBRT to the lung lesion 04/28/2021: CT C/A/P showed multifocal lytic bone metastases are identified. Lesion within the spine of the left scapula and lesions involving bilateral iliac bones and left sacral wing. Establish care with Dr. Leonides Schanz  05/03/2021: biopsy of right iliac lytic lesion showed metastatic carcinoma, consistent with lung primary.  07/08/2021: Cycle 1 Day 1 of Carbo/Pem/Pem 07/29/2021: Cycle 2 Day 1 of Carbo/Pem/Pem 08/11/2021-08/24/2021: Received palliative radiation to osseous metastasis in the left shoulder. 30 Gy in 10 fractions.  08/19/2021: Cycle 3 Day 1 of Carbo/Pem/Pem 09/09/2021: Cycle 4 Day 1 of Carbo/Pem/Pem 09/23/2021: CT CAP: stable disease 09/30/2021: Cycle 5 Day 1 of Pem/Pem 10/21/2021: Cycle 6 Day 1 of Pembrolizumab. Held pemetrexed due to anemia. 11/16/2021: Cycle 7 Day 1 of Pem/Pem.   12/16/2021: Cycle 8 Day 1 of Pem/Pem.   01/06/2022: Cycle 9 Day 1 of Pem/Pem.  01/27/2022: Cycle 10 Day 1 of Pem/Pem 02/17/2022: Cycle 11 Day 1 of Pem/Pem 03/10/2022: Cycle 12 Day 1 of Pem/Pem (HELD due to anemia with Hgb of 6.4) 03/17/2022: Cycle 12 Day 1 of Pem/Pem (HELD pemextrexed due to cytopenias/kidney function) 04/10/2022: Cycle 13 Day 1 of Pem/Pem (HELD pemextrexed due to cytopenias/kidney function) 04/28/2022: Cycle 14 Day 1 of Pem/Pem  (HELD pemextrexed due to cytopenias/kidney function) 05/23/2022:  Cycle 15 Day 1 of Pem/Pem (HELD pemextrexed due to cytopenias/kidney function) 06/15/2022: Cycle 16 Day 1 of Pembrolizumab.  07/07/2022: Cycle 17 Day 1 of Pembrolizumab.  07/28/2022: Cycle 18 Day 1 of Pembrolizumab 08/18/2022: Cycle 19 Day 1 of Pembrolizumab HELD due to hyponatremia of 118, new bone metastases. 08/25/2022: Cycle 19 Day 1 of Pembrolizumab 08/28/2022-09/13/2022: Received palliative radiation to right pelvis with total dose of 30 Gy in 10 Fx.  09/22/2022:  Cycle 20 Day 1 of Pembrolizumab 10/13/2022:  Cycle 21 Day 1 of Pembrolizumab 12/11/2022: Cycle 1 Day 1 of Docetaxel/Ramucircumab.   #Adenocarcinoma of the Prostate, T1cN0M0 08/19/2019-10/07/2019: 70 Gy in 28 fractions of 2.5 Gy.  Radiation to the prostate was under the care of Dr. Margaretmary Dys  Interval History:  Terry Page. 67 y.o. male with medical history significant for metastatic adenocarcinoma of the lung who presents for a follow up visit. The patient's last visit was on 11/24/2022. In the interim since the last visit he started Cycle 1 of Docetaxel/Ramucircumab.   On exam today, Terry Page reports he tolerated his first treatment of chemotherapy well, though it is only Wednesday and he was treated on Monday.  He reports he did have some diarrhea that "calm down on its own".  He notes he does not have any nausea or vomiting.  His appetite remains strong and he is not having any lightheadedness, dizziness, or shortness of breath.  He reports his energy levels are okay and he is not having any infectious symptoms such as runny nose, sore throat, or cough.  He reports nothing else has been out of the ordinary.  His  port is working well.  He reports that his pain is currently under good control and reports his pain levels are about a 5 out of 10 right now.  He is taking about 6 of the short acting pain medications per day in addition to his long-acting twice daily  medication.  Overall he feels like his pain is manageable and his chemotherapy treatment so far has been tolerable.  He denies fevers, chills, sweats, shortness of breath, chest pain, cough, headaches or dizziness. He has no other complaints. A full 10 point ROS is listed below.  MEDICAL HISTORY:  Past Medical History:  Diagnosis Date   Alcohol abuse    Asthma    Bullous emphysema ([name])    Essential hypertension 04/10/2020   Hemorrhoids    History of radiation therapy 04/08/20-04/19/20   IMRT- Left lung- Dr. Antony Blackbird   Incidental lung nodule, greater than or equal to 8mm 10/22/2017   Left upper lobe - discovered on CTA   Prostate cancer ([name])    Spontaneous pneumothorax 10/20/2017   right   Tobacco abuse     SURGICAL HISTORY: Past Surgical History:  Procedure Laterality Date   BACK SURGERY     IR IMAGING GUIDED PORT INSERTION  06/29/2021   PROSTATE BIOPSY      SOCIAL HISTORY: Social History   Socioeconomic History   Marital status: Single    Spouse name: Not on file   Number of children: Not on file   Years of education: Not on file   Highest education level: Not on file  Occupational History   Occupation: retired  Tobacco Use   Smoking status: Former    Current packs/day: 0.00    Types: Cigarettes    Quit date: 11/22/2019    Years since quitting: 3.0   Smokeless tobacco: Never   Tobacco comments:    Patient reports quit 3 years ago. 06/25/20. [facility]  Vaping Use   Vaping status: Never Used  Substance and Sexual Activity   Alcohol use: Yes    Alcohol/week: 12.0 standard drinks of alcohol    Types: 12 Cans of beer per week    Comment: daily sometimes   Drug use: No   Sexual activity: Not Currently  Other Topics Concern   Not on file  Social History Narrative   Not on file   Social Determinants of Health   Financial Resource Strain: Not on file  Food Insecurity: Not on file  Transportation Needs: Unmet Transportation Needs (08/18/2022)   PRAPARE -  Administrator, Civil Service (Medical): Yes    Lack of Transportation (Non-Medical): No  Physical Activity: Not on file  Stress: Not on file  Social Connections: Not on file  Intimate Partner Violence: Not on file    FAMILY HISTORY: Family History  Problem Relation Age of Onset   Cancer Cousin        maternal cousin   Cancer Cousin        paternal cousin   Cancer Cousin    Colon polyps Neg Hx    Pancreatic disease Neg Hx    Pancreatic cancer Neg Hx    Breast cancer Neg Hx    Colon cancer Neg Hx     ALLERGIES:  is allergic to morphine and oxycodone.  MEDICATIONS:  Current Outpatient Medications  Medication Sig Dispense Refill   acetaminophen (TYLENOL) 325 MG tablet Take 2 tablets (650 mg total) by mouth every 6 (six) hours as needed for mild pain or headache (  fever >/= 101).     albuterol (VENTOLIN HFA) 108 (90 Base) MCG/ACT inhaler Inhale 2 puffs into the lungs every 6 (six) hours as needed for wheezing or shortness of breath. 8 g 1   amLODipine (NORVASC) 10 MG tablet Take 1 tablet by mouth daily.     atorvastatin (LIPITOR) 20 MG tablet Take 20 mg by mouth daily.     cyanocobalamin (VITAMIN B12) 1000 MCG tablet Take 1 tablet (1,000 mcg total) by mouth daily. 90 tablet 1   dexamethasone (DECADRON) 4 MG tablet Take 2 tabs by mouth 2 times daily starting day before chemo. Then take 2 tabs daily for 2 days starting day after chemo. Take with food. 30 tablet 1   ferrous sulfate 325 (65 FE) MG EC tablet Take 1 tablet (325 mg total) by mouth daily with breakfast. 30 tablet 3   folic acid (FOLVITE) 1 MG tablet Take 1 tablet (1 mg total) by mouth daily. 30 tablet 3   hydrOXYzine (ATARAX) 25 MG tablet Take 1 tablet (25 mg total) by mouth at bedtime as needed for itching. 30 tablet 11   ipratropium-albuterol (DUONEB) 0.5-2.5 (3) MG/3ML SOLN Take 3 mLs by nebulization every 6 (six) hours as needed. (Patient taking differently: Take 3 mLs by nebulization every 6 (six) hours as  needed (shortness of breathing or wheezing).) 360 mL 0   lidocaine (LIDODERM) 5 % Place 1 patch onto the skin daily. Remove & Discard patch within 12 hours or as directed by MD. Apply to low back 30 patch 0   lidocaine-prilocaine (EMLA) cream Apply 1 Application topically as needed. 30 g 0   losartan (COZAAR) 100 MG tablet Take 100 mg by mouth daily.     olmesartan (BENICAR) 40 MG tablet Take 40 mg by mouth daily.     ondansetron (ZOFRAN) 8 MG tablet Take 1 tablet (8 mg total) by mouth every 8 (eight) hours as needed for nausea or vomiting. 30 tablet 1   polyethylene glycol (MIRALAX) 17 g packet Take 17 g by mouth daily. 30 each 2   potassium chloride SA (KLOR-CON M) 20 MEQ tablet TAKE 1 TABLET(20 MEQ) BY MOUTH DAILY 90 tablet 0   prochlorperazine (COMPAZINE) 10 MG tablet Take 1 tablet (10 mg total) by mouth every 6 (six) hours as needed for nausea or vomiting. 30 tablet 1   senna-docusate (STIMULANT LAXATIVE) 8.6-50 MG tablet Take 2 tablets by mouth 2 (two) times daily. May add additional tablet as needed 150 tablet 3   sodium chloride 1 g tablet Take 1 tablet (1 g total) by mouth 3 (three) times daily with meals. 90 tablet 3   thiamine 100 MG tablet Take 1 tablet (100 mg total) by mouth daily.     traMADol (ULTRAM) 50 MG tablet Take 1-2 tablets (50-100 mg total) by mouth every 6 (six) hours as needed. 120 tablet 0   No current facility-administered medications for this visit.    REVIEW OF SYSTEMS:   Constitutional: ( - ) fevers, ( - )  chills , ( - ) night sweats Eyes: ( - ) blurriness of vision, ( - ) double vision, ( - ) watery eyes Ears, nose, mouth, throat, and face: ( - ) mucositis, ( - ) sore throat Respiratory: ( - ) cough, ( - ) dyspnea, ( - ) wheezes Cardiovascular: ( - ) palpitation, ( - ) chest discomfort, ( - ) lower extremity swelling Gastrointestinal:  ( - ) nausea, ( - ) heartburn, ( - )  change in bowel habits Skin: ( - ) abnormal skin rashes Lymphatics: ( - ) new  lymphadenopathy, ( - ) easy bruising Neurological: ( - ) numbness, ( - ) tingling, ( - ) new weaknesses Behavioral/Psych: ( - ) mood change, ( - ) new changes  All other systems were reviewed with the patient and are negative.  PHYSICAL EXAMINATION: ECOG PERFORMANCE STATUS: 1 - Symptomatic but completely ambulatory  Vitals:   12/13/22 1409 12/13/22 1411  BP: (!) 135/93 127/82  Pulse: 100   Resp: 19   Temp: (!) 97.5 F (36.4 C)   SpO2: 100%    Filed Weights   12/13/22 1409  Weight: 146 lb 9.6 oz (66.5 kg)    GENERAL: Well-appearing elderly African-American male, alert, no distress and comfortable SKIN: skin color, texture, turgor are normal, no rashes or significant lesions EYES: conjunctiva are pink and non-injected, sclera clear LUNGS: clear to auscultation and percussion with normal breathing effort HEART: regular rate & rhythm and no murmurs and no lower extremity edema Musculoskeletal: no cyanosis of digits and no clubbing  PSYCH: alert & oriented x 3, fluent speech NEURO: no focal motor/sensory deficits  LABORATORY DATA:  I have reviewed the data as listed    Latest Ref Rng & Units 12/11/2022    8:56 AM 11/24/2022    9:31 AM 11/02/2022    7:42 AM  CBC  WBC 4.0 - 10.5 K/uL 18.6  10.5  9.2   Hemoglobin 13.0 - 17.0 g/dL 98.1  19.1  47.8   Hematocrit 39.0 - 52.0 % 37.0  33.8  32.9   Platelets 150 - 400 K/uL 354  313  354        Latest Ref Rng & Units 12/11/2022    8:56 AM 11/24/2022    9:31 AM 11/02/2022    7:42 AM  CMP  Glucose 70 - 99 mg/dL 295  95  97   BUN 8 - 23 mg/dL 14  12  12    Creatinine 0.61 - 1.24 mg/dL 6.21  3.08  6.57   Sodium 135 - 145 mmol/L 128  129  132   Potassium 3.5 - 5.1 mmol/L 4.0  4.6  4.4   Chloride 98 - 111 mmol/L 96  100  101   CO2 22 - 32 mmol/L 22  24  24    Calcium 8.9 - 10.3 mg/dL 9.9  9.7  9.6   Total Protein 6.5 - 8.1 g/dL 9.0  8.2  8.2   Total Bilirubin <1.2 mg/dL 0.3  0.5  0.4   Alkaline Phos 38 - 126 U/L 83  76  67   AST  15 - 41 U/L 15  14  14    ALT 0 - 44 U/L 9  9  8      Lab Results  Component Value Date   MPROTEIN Not Observed 04/28/2021   Lab Results  Component Value Date   KPAFRELGTCHN 33.6 (H) 04/28/2021   LAMBDASER 22.0 04/28/2021   KAPLAMBRATIO 1.53 04/28/2021    RADIOGRAPHIC STUDIES: CT CHEST ABDOMEN PELVIS W CONTRAST  Result Date: 11/24/2022 CLINICAL DATA:  Non-small cell lung cancer, metastatic, assess treatment response. * Tracking Code: BO * EXAM: CT CHEST, ABDOMEN, AND PELVIS WITH CONTRAST TECHNIQUE: Multidetector CT imaging of the chest, abdomen and pelvis was performed following the standard protocol during bolus administration of intravenous contrast. RADIATION DOSE REDUCTION: This exam was performed according to the departmental dose-optimization program which includes automated exposure control, adjustment of the mA and/or kV according  to patient size and/or use of iterative reconstruction technique. CONTRAST:  OMNIPAQUE IOHEXOL 300 MG/ML  SOLN COMPARISON:  08/09/2022. FINDINGS: CT CHEST FINDINGS Cardiovascular: Right IJ Port-A-Cath terminates at the SVC RA junction. Atherosclerotic calcification of the aorta, aortic valve and coronary arteries. Heart is at the upper limits of normal in size. No pericardial effusion. Mediastinum/Nodes: No pathologically enlarged mediastinal, hilar or axillary lymph nodes. Esophagus is grossly unremarkable. Lungs/Pleura: Centrilobular and paraseptal emphysema. New and enlarging bilateral pulmonary nodules. Index new 10 mm nodule in the medial anterior segment right upper lobe (4/82). Index 6 mm right lower lobe nodule (4/90), enlarged from 4 mm. Scattered scarring. Bullous changes in the left upper lobe. Areas of post treatment architectural distortion in the apicoposterior and perihilar left upper lobe. No pleural fluid. Airway is unremarkable. Musculoskeletal: Degenerative changes in the spine. Similar mixed lytic and sclerotic lesion in the left scapula  osteopenia. Old left anterolateral rib fracture. Slight compression of the T12 superior endplate, old. CT ABDOMEN PELVIS FINDINGS Hepatobiliary: New minimally hypoattenuating nodule in the inferior right hepatic lobe, along the gallbladder fossa, measures 16 mm (2/69). Multiple additional low-attenuation lesions in the liver, as before. Gallbladder is unremarkable. No biliary ductal dilatation. Pancreas: Negative. Spleen: Negative. Adrenals/Urinary Tract: Adrenal glands are unremarkable. New 10 mm hypodense lesion along the lateral upper pole right kidney (2/65). Additional low-attenuation lesions in the kidneys, as before. Ureters are decompressed. Bladder is grossly unremarkable. Stomach/Bowel: Stomach, small bowel, appendix and colon are unremarkable. Vascular/Lymphatic: Atherosclerotic calcification of the aorta. No pathologically enlarged lymph nodes. Reproductive: Prostate is visualized. Other: Mild presacral edema. No free fluid. Mesenteries and peritoneum are unremarkable. Musculoskeletal: Mixed lytic and sclerotic lesions throughout the pelvis, as before. Degenerative changes in the spine. IMPRESSION: 1. Interval progression of metastatic disease as evidenced by new/enlarging pulmonary nodules, new right hepatic lobe lesion and new right renal lesion. 2. Grossly stable osseous metastatic disease. 3. Aortic atherosclerosis (ICD10-I70.0). Coronary artery calcification. 4.  Emphysema (ICD10-J43.9). Electronically Signed   By: Leanna Battles M.D.   On: 11/24/2022 09:21    ASSESSMENT & PLAN Terry Page. Is a 67 y.o.  male with medical history significant for metastatic adenocarcinoma of the lung who presents for a follow up visit.  # Metastatic Adenocarcinoma of the Lung  -- NGS testing from this patient shows no evidence of targetable mutation. --MRI brain shows no evidence of intracranial metastases --Started carboplatin, pembrolizumab, and pemetrexed as treatment for his cancer on  07/08/2021. --Received palliative radiation to osseous metastasis of the left shoulder from 08/11/2021-08/24/2021. Planned dose is 30 Gy in 10 fx.  --Discontinued Pemetrexed on 03/17/2022 due to persistent anemia and kidney dysfunction. --Restaging CT CAP from 08/09/2022 shows interval enlargement of multiple mixed lytic and sclerotic bone metastases involving the right pelvis. Other bone metastases including of the left scapula, sacrum, and left hemipelvis are unchanged. Unchanged treated central LUL mass. Recommend to continue on pembrolizumab therapy and arrange for palliative radiation to enlarging bone metastases.  Plan: --today is Cycle 1 Day 3 of Docetaxel/Ramucirumab.  --labs from today were reviewed. Labs show white blood cell count 18.6, Hgb 12.2, MCV 91.6, Plt 354. Creatinine and LFTs normal.  --Plan for repeat CT imaging around Feb 2025. Last scan in Nov 2024 showed progression of disease.  --RTC in 3 week with labs and follow up visit before Cycle 2. Interval weekly labs.   #Right hip pain: --Secondary progressive bone metastases in right pelvis seen on CT imaging from 08/09/2022 --Received palliative  radiation to the right pelvis from 08/28/2022-09/13/2022 with total dose of 30 Gy in 10 Fx.  --Pain is slightly improved but still present.  --Continue to take tramadol q 6 hours for pain control.   #Hyponatremia: --Chronic in nature --Sodium level dropped from 127 on 08/09/2022 to 118.  Today sodium improved to 128 --Etiologies including SIADH, immunotherapy induced, too much water intake.  --Patient is asymptomatic without nausea/vomiting, headaches, confusion, seizures, etc --Currently on salt tablets 2 gm PO daily. We advised to titrate to twice daily x 7 days and then three times daily.  --Strict ED precautions given for any new symptoms as discussed above.   # Iron Deficiency Anemia #Chemotherapy induced anemia --Hgb level 12.2 today.  --Currently on ferrous sulfate 325 mg PO  daily  #Pain Control --stopped oxycodone due to itching --currently following with Palliative care.  --pain well controlled with tramadol as needed. Continue to take tramdol 50-100 mg q 6 hours as needed. --Continue senna docusate for constipation prophylaxis --Continue to monitor  #Supportive Care -- chemotherapy education complete -- port placed -- zofran 8mg  q8H PRN and compazine 10mg  PO q6H for nausea -- EMLA cream for port -- Patient referred to palliative care for pain control. Pain control as above.    No orders of the defined types were placed in this encounter.   All questions were answered. The patient knows to call the clinic with any problems, questions or concerns.  I have spent a total of 30 minutes minutes of face-to-face and non-face-to-face time, preparing to see the patient, performing a medically appropriate examination, counseling and educating the patient, referring and communicating with other health care professionals, documenting clinical information in the electronic health record, and care coordination.   Ulysees Barns, MD Department of Hematology/Oncology [facility] Phone: [phone] Pager: [phone] Email: [email or web address]  12/13/2022 4:16 PM

## 2022-12-15 NOTE — Progress Notes (Unsigned)
Palliative Medicine [facility]  Telephone:(336) [phone] Fax:(336) [phone]   Name: Terry Page. Date: 12/15/2022 MRN: [id number]  DOB: June 18, 1955  Patient Care Team: Aliene Beams, MD as PCP - General (Family Medicine)   I connected with [name]. on 12/15/22 at  3:00 PM EST by phone and verified that I am speaking with the correct person using two identifiers.   I discussed the limitations, risks, security and privacy concerns of performing an evaluation and management service by telemedicine and the availability of in-person appointments. I also discussed with the patient that there may be a patient responsible charge related to this service. The patient expressed understanding and agreed to proceed.   Other persons participating in the visit and their role in the encounter: n/a   Patient's location: home  Provider's location: [facility]   Chief Complaint: f/u of symptom management   INTERVAL HISTORY: Terry Page. is a 67 y.o. 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 He is currently undergoing chemotherapy. Palliative ask to see for symptom management.  SOCIAL HISTORY:    [name] reports that he quit smoking about 3 years ago. His smoking use included cigarettes. He has never used smokeless tobacco. He reports current alcohol use of about 12.0 standard drinks of alcohol per week. He reports that he does not use drugs.  ADVANCE DIRECTIVES:  None on file   CODE STATUS: Full Code  PAST MEDICAL HISTORY: Past Medical History:  Diagnosis Date   Alcohol abuse    Asthma    Bullous emphysema ([name])    Essential hypertension 04/10/2020   Hemorrhoids    History of radiation therapy 04/08/20-04/19/20   IMRT- Left lung- Dr. Antony Blackbird   Incidental lung nodule, greater than or equal to  8mm 10/22/2017   Left upper lobe - discovered on CTA   Prostate cancer ([name])    Spontaneous pneumothorax 10/20/2017   right   Tobacco abuse     ALLERGIES:  is allergic to morphine and oxycodone.  MEDICATIONS:  Current Outpatient Medications  Medication Sig Dispense Refill   acetaminophen (TYLENOL) 325 MG tablet Take 2 tablets (650 mg total) by mouth every 6 (six) hours as needed for mild pain or headache (fever >/= 101).     albuterol (VENTOLIN HFA) 108 (90 Base) MCG/ACT inhaler Inhale 2 puffs into the lungs every 6 (six) hours as needed for wheezing or shortness of breath. 8 g 1   amLODipine (NORVASC) 10 MG tablet Take 1 tablet by mouth daily.     atorvastatin (LIPITOR) 20 MG tablet Take 20 mg by mouth daily.     cyanocobalamin (VITAMIN B12) 1000 MCG tablet Take 1 tablet (1,000 mcg total) by mouth daily. 90 tablet 1   dexamethasone (DECADRON) 4 MG tablet Take 2 tabs by mouth 2 times daily starting day before chemo. Then take 2 tabs daily for 2 days starting day after chemo. Take with food. 30 tablet 1   ferrous sulfate 325 (65 FE) MG EC tablet Take 1 tablet (325 mg total) by mouth daily with breakfast. 30 tablet 3   folic acid (FOLVITE) 1 MG tablet Take 1 tablet (1 mg total) by mouth daily. 30 tablet 3   hydrOXYzine (ATARAX) 25 MG tablet Take 1 tablet (25 mg total) by mouth at bedtime as needed for itching. 30 tablet 11  ipratropium-albuterol (DUONEB) 0.5-2.5 (3) MG/3ML SOLN Take 3 mLs by nebulization every 6 (six) hours as needed. (Patient taking differently: Take 3 mLs by nebulization every 6 (six) hours as needed (shortness of breathing or wheezing).) 360 mL 0   lidocaine (LIDODERM) 5 % Place 1 patch onto the skin daily. Remove & Discard patch within 12 hours or as directed by MD. Apply to low back 30 patch 0   lidocaine-prilocaine (EMLA) cream Apply 1 Application topically as needed. 30 g 0   losartan (COZAAR) 100 MG tablet Take 100 mg by mouth daily.     olmesartan (BENICAR) 40 MG  tablet Take 40 mg by mouth daily.     ondansetron (ZOFRAN) 8 MG tablet Take 1 tablet (8 mg total) by mouth every 8 (eight) hours as needed for nausea or vomiting. 30 tablet 1   polyethylene glycol (MIRALAX) 17 g packet Take 17 g by mouth daily. 30 each 2   potassium chloride SA (KLOR-CON M) 20 MEQ tablet TAKE 1 TABLET(20 MEQ) BY MOUTH DAILY 90 tablet 0   prochlorperazine (COMPAZINE) 10 MG tablet Take 1 tablet (10 mg total) by mouth every 6 (six) hours as needed for nausea or vomiting. 30 tablet 1   senna-docusate (STIMULANT LAXATIVE) 8.6-50 MG tablet Take 2 tablets by mouth 2 (two) times daily. May add additional tablet as needed 150 tablet 3   sodium chloride 1 g tablet Take 1 tablet (1 g total) by mouth 3 (three) times daily with meals. 90 tablet 3   thiamine 100 MG tablet Take 1 tablet (100 mg total) by mouth daily.     traMADol (ULTRAM) 50 MG tablet Take 1-2 tablets (50-100 mg total) by mouth every 6 (six) hours as needed. 120 tablet 0   No current facility-administered medications for this visit.    VITAL SIGNS: There were no vitals taken for this visit. There were no vitals filed for this visit.  Estimated body mass index is 21.65 kg/m as calculated from the following:   Height as of 12/11/22: 5\' 9"  (1.753 m).   Weight as of 12/13/22: 146 lb 9.6 oz (66.5 kg).   PERFORMANCE STATUS (ECOG) : 1 - Symptomatic but completely ambulatory  Assessment NAD RRR Normal breathing pattern AAO x4  Discussed the use of AI scribe software for clinical note transcription with the patient, who gave verbal consent to proceed.   IMPRESSION:  Terry Page presents to clinic for follow-up. No acute distress noted. The patient reports no issues with constipation and maintains a good appetite. He has not reported any new symptoms or changes in his overall health status. He is planning to spend the Thanksgiving holiday with his son.   Unfortunately, recent scans showed progression. He met with his Oncology  team today and was informed of this and what next plan of care would look like. [name] share he is due to start a new, stronger treatment regimen for his malignancy in the first week of December, which will be administered every three weeks. He expresses concern about potential side effects but is hopeful that this regimen will be more effective. He was hoping for better news but is going to try to remain positive. I created space and opportunity allowing him to openly express his thoughts and feelings. Emotional support provided.   Jaymison has been managing his pain with Tramadol. He reports that the medication has been effective in 'leveling out' his pain. However, following radiation therapy for his hip, he noticed an increase in  his pain levels, requiring him to take a little more often throughout the day. Over the past week, his pain levels have decreased, and he has reduced his Tramadol intake to one or two a day, as needed. We will continue to closely monitor and support as needed.  PLAN:  Cancer Pain Management Pain well controlled with Tramadol. Increased use after hip radiation but has decreased recently. No constipation reported. -Continue Tramadol as needed for pain control.  Cancer Treatment Patient recently completed radiation for hip. New treatment regimen to start on December 08, 2022, every three weeks due to progression. Patient expressed concern about potential side effects. -Start new treatment regimen on December 08, 2022 per Oncologist.  -Monitor closely for side effects and adjust treatment as necessary. Assist with symptom management as needed.   General Health Maintenance Good appetite, no constipation. -Continue current diet and lifestyle habits. -We will plan to continue close follow-up. -Will plan to see patient back in the clinic after first treatment. He knows to contact office sooner if needed.  Patient expressed understanding and was in agreement with this plan.  He also understands that He can call the clinic at any time with any questions, concerns, or complaints.   Any controlled substances utilized were prescribed in the context of palliative care. PDMP has been reviewed.    Visit consisted of counseling and education dealing with the complex and emotionally intense issues of symptom management and palliative care in the setting of serious and potentially life-threatening illness.  [name], AGPCNP-BC  Palliative Medicine Team/[facility]  *Please note that this is a verbal dictation therefore any spelling or grammatical errors are due to the "Dragon [employer] One" system interpretation.

## 2022-12-18 ENCOUNTER — Inpatient Hospital Stay: Payer: Medicare HMO | Admitting: Nurse Practitioner

## 2022-12-18 ENCOUNTER — Telehealth: Payer: Self-pay | Admitting: *Deleted

## 2022-12-18 NOTE — Telephone Encounter (Signed)
Received vm message from pt requesting refills on his pain medication and wishes to have Lowella Bandy, NP in Palliative care call him as he had some questions. Message sent to Nolon Bussing, NP

## 2022-12-19 ENCOUNTER — Other Ambulatory Visit: Payer: Self-pay | Admitting: Nurse Practitioner

## 2022-12-19 ENCOUNTER — Telehealth: Payer: Self-pay | Admitting: *Deleted

## 2022-12-19 DIAGNOSIS — C7951 Secondary malignant neoplasm of bone: Secondary | ICD-10-CM

## 2022-12-19 DIAGNOSIS — Z515 Encounter for palliative care: Secondary | ICD-10-CM

## 2022-12-19 DIAGNOSIS — G893 Neoplasm related pain (acute) (chronic): Secondary | ICD-10-CM

## 2022-12-19 MED ORDER — TRAMADOL HCL 50 MG PO TABS
50.0000 mg | ORAL_TABLET | Freq: Four times a day (QID) | ORAL | 0 refills | Status: DC | PRN
Start: 2022-12-19 — End: 2023-01-17

## 2022-12-19 NOTE — Telephone Encounter (Signed)
Received vm messages from pt. He is requesting refills on his pain medication and wishes to have Lowella Bandy, NP in Palliative Care call him as he had some questions. VM forwarded Message routed to Nolon Bussing, NP

## 2022-12-22 ENCOUNTER — Other Ambulatory Visit: Payer: Self-pay | Admitting: Hematology and Oncology

## 2022-12-22 ENCOUNTER — Other Ambulatory Visit: Payer: Self-pay | Admitting: *Deleted

## 2022-12-22 ENCOUNTER — Inpatient Hospital Stay: Payer: Medicare HMO

## 2022-12-22 ENCOUNTER — Inpatient Hospital Stay (HOSPITAL_BASED_OUTPATIENT_CLINIC_OR_DEPARTMENT_OTHER): Payer: Medicare HMO | Admitting: Nurse Practitioner

## 2022-12-22 ENCOUNTER — Encounter: Payer: Self-pay | Admitting: Nurse Practitioner

## 2022-12-22 VITALS — BP 106/79 | HR 73 | Temp 97.0°F | Resp 18 | Wt 150.2 lb

## 2022-12-22 DIAGNOSIS — G893 Neoplasm related pain (acute) (chronic): Secondary | ICD-10-CM | POA: Diagnosis not present

## 2022-12-22 DIAGNOSIS — Z5112 Encounter for antineoplastic immunotherapy: Secondary | ICD-10-CM | POA: Diagnosis not present

## 2022-12-22 DIAGNOSIS — C3412 Malignant neoplasm of upper lobe, left bronchus or lung: Secondary | ICD-10-CM

## 2022-12-22 DIAGNOSIS — C3490 Malignant neoplasm of unspecified part of unspecified bronchus or lung: Secondary | ICD-10-CM

## 2022-12-22 DIAGNOSIS — Z515 Encounter for palliative care: Secondary | ICD-10-CM

## 2022-12-22 DIAGNOSIS — R530 Neoplastic (malignant) related fatigue: Secondary | ICD-10-CM | POA: Diagnosis not present

## 2022-12-22 DIAGNOSIS — C7951 Secondary malignant neoplasm of bone: Secondary | ICD-10-CM

## 2022-12-22 DIAGNOSIS — Z95828 Presence of other vascular implants and grafts: Secondary | ICD-10-CM

## 2022-12-22 DIAGNOSIS — D649 Anemia, unspecified: Secondary | ICD-10-CM

## 2022-12-22 LAB — CBC WITH DIFFERENTIAL (CANCER CENTER ONLY)
Abs Immature Granulocytes: 2.02 10*3/uL — ABNORMAL HIGH (ref 0.00–0.07)
Basophils Absolute: 0 10*3/uL (ref 0.0–0.1)
Basophils Relative: 0 %
Eosinophils Absolute: 0.1 10*3/uL (ref 0.0–0.5)
Eosinophils Relative: 0 %
HCT: 31.3 % — ABNORMAL LOW (ref 39.0–52.0)
Hemoglobin: 10.7 g/dL — ABNORMAL LOW (ref 13.0–17.0)
Immature Granulocytes: 6 %
Lymphocytes Relative: 3 %
Lymphs Abs: 1 10*3/uL (ref 0.7–4.0)
MCH: 30.5 pg (ref 26.0–34.0)
MCHC: 34.2 g/dL (ref 30.0–36.0)
MCV: 89.2 fL (ref 80.0–100.0)
Monocytes Absolute: 1.1 10*3/uL — ABNORMAL HIGH (ref 0.1–1.0)
Monocytes Relative: 4 %
Neutro Abs: 27.9 10*3/uL — ABNORMAL HIGH (ref 1.7–7.7)
Neutrophils Relative %: 87 %
Platelet Count: 220 10*3/uL (ref 150–400)
RBC: 3.51 MIL/uL — ABNORMAL LOW (ref 4.22–5.81)
RDW: 14.2 % (ref 11.5–15.5)
WBC Count: 32.2 10*3/uL — ABNORMAL HIGH (ref 4.0–10.5)
nRBC: 0.9 % — ABNORMAL HIGH (ref 0.0–0.2)

## 2022-12-22 LAB — CMP (CANCER CENTER ONLY)
ALT: 59 U/L — ABNORMAL HIGH (ref 0–44)
AST: 60 U/L — ABNORMAL HIGH (ref 15–41)
Albumin: 3.3 g/dL — ABNORMAL LOW (ref 3.5–5.0)
Alkaline Phosphatase: 154 U/L — ABNORMAL HIGH (ref 38–126)
Anion gap: 7 (ref 5–15)
BUN: 15 mg/dL (ref 8–23)
CO2: 23 mmol/L (ref 22–32)
Calcium: 9 mg/dL (ref 8.9–10.3)
Chloride: 104 mmol/L (ref 98–111)
Creatinine: 1.04 mg/dL (ref 0.61–1.24)
GFR, Estimated: >60 mL/min (ref >60)
Glucose, Bld: 120 mg/dL — ABNORMAL HIGH (ref 70–99)
Potassium: 4 mmol/L (ref 3.5–5.1)
Sodium: 134 mmol/L — ABNORMAL LOW (ref 135–145)
Total Bilirubin: 0.2 mg/dL (ref <1.2)
Total Protein: 6.8 g/dL (ref 6.5–8.1)

## 2022-12-22 MED ORDER — SODIUM CHLORIDE 0.9% FLUSH
10.0000 mL | Freq: Once | INTRAVENOUS | Status: AC
  Administered 2022-12-22: 10 mL

## 2022-12-22 MED ORDER — HEPARIN SOD (PORK) LOCK FLUSH 100 UNIT/ML IV SOLN
500.0000 [IU] | Freq: Once | INTRAVENOUS | Status: AC
  Administered 2022-12-22: 500 [IU]

## 2022-12-22 NOTE — Progress Notes (Signed)
Palliative Medicine [facility]  Telephone:(336) [phone] Fax:(336) [phone]   Name: Terry Page. Date: 12/22/2022 MRN: [id number]  DOB: 1955-10-26  Patient Care Team: Aliene Beams, MD as PCP - General (Family Medicine)    INTERVAL HISTORY: Terry Page. is a 67 y.o. 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 He is currently undergoing chemotherapy. Palliative ask to see for symptom management.  SOCIAL HISTORY:    Terry Page reports that he quit smoking about 3 years ago. His smoking use included cigarettes. He has never used smokeless tobacco. He reports current alcohol use of about 12.0 standard drinks of alcohol per week. He reports that he does not use drugs.  ADVANCE DIRECTIVES:  None on file   CODE STATUS: Full Code  PAST MEDICAL HISTORY: Past Medical History:  Diagnosis Date   Alcohol abuse    Asthma    Bullous emphysema ([name])    Essential hypertension 04/10/2020   Hemorrhoids    History of radiation therapy 04/08/20-04/19/20   IMRT- Left lung- Dr. Antony Blackbird   Incidental lung nodule, greater than or equal to 8mm 10/22/2017   Left upper lobe - discovered on CTA   Prostate cancer ([name])    Spontaneous pneumothorax 10/20/2017   right   Tobacco abuse     ALLERGIES:  is allergic to morphine and oxycodone.  MEDICATIONS:  Current Outpatient Medications  Medication Sig Dispense Refill   acetaminophen (TYLENOL) 325 MG tablet Take 2 tablets (650 mg total) by mouth every 6 (six) hours as needed for mild pain or headache (fever >/= 101).     albuterol (VENTOLIN HFA) 108 (90 Base) MCG/ACT inhaler Inhale 2 puffs into the lungs every 6 (six) hours as needed for wheezing or shortness of breath. 8 g 1   amLODipine (NORVASC) 10 MG tablet Take 1 tablet by mouth daily.     atorvastatin (LIPITOR)  20 MG tablet Take 20 mg by mouth daily.     cyanocobalamin (VITAMIN B12) 1000 MCG tablet Take 1 tablet (1,000 mcg total) by mouth daily. 90 tablet 1   dexamethasone (DECADRON) 4 MG tablet Take 2 tabs by mouth 2 times daily starting day before chemo. Then take 2 tabs daily for 2 days starting day after chemo. Take with food. 30 tablet 1   ferrous sulfate 325 (65 FE) MG EC tablet Take 1 tablet (325 mg total) by mouth daily with breakfast. 30 tablet 3   folic acid (FOLVITE) 1 MG tablet Take 1 tablet (1 mg total) by mouth daily. 30 tablet 3   hydrOXYzine (ATARAX) 25 MG tablet Take 1 tablet (25 mg total) by mouth at bedtime as needed for itching. 30 tablet 11   ipratropium-albuterol (DUONEB) 0.5-2.5 (3) MG/3ML SOLN Take 3 mLs by nebulization every 6 (six) hours as needed. (Patient taking differently: Take 3 mLs by nebulization every 6 (six) hours as needed (shortness of breathing or wheezing).) 360 mL 0   lidocaine (LIDODERM) 5 % Place 1 patch onto the skin daily. Remove & Discard patch within 12 hours or as directed by MD. Apply to low back 30 patch 0   lidocaine-prilocaine (EMLA) cream Apply 1 Application topically as needed. 30 g 0   losartan (COZAAR) 100 MG tablet Take 100 mg by mouth daily.     olmesartan (BENICAR) 40 MG tablet Take 40 mg by mouth daily.  ondansetron (ZOFRAN) 8 MG tablet Take 1 tablet (8 mg total) by mouth every 8 (eight) hours as needed for nausea or vomiting. 30 tablet 1   polyethylene glycol (MIRALAX) 17 g packet Take 17 g by mouth daily. 30 each 2   potassium chloride SA (KLOR-CON M) 20 MEQ tablet TAKE 1 TABLET(20 MEQ) BY MOUTH DAILY 90 tablet 0   prochlorperazine (COMPAZINE) 10 MG tablet Take 1 tablet (10 mg total) by mouth every 6 (six) hours as needed for nausea or vomiting. 30 tablet 1   senna-docusate (STIMULANT LAXATIVE) 8.6-50 MG tablet Take 2 tablets by mouth 2 (two) times daily. May add additional tablet as needed 150 tablet 3   sodium chloride 1 g tablet Take 1  tablet (1 g total) by mouth 3 (three) times daily with meals. 90 tablet 3   thiamine 100 MG tablet Take 1 tablet (100 mg total) by mouth daily.     traMADol (ULTRAM) 50 MG tablet Take 1-2 tablets (50-100 mg total) by mouth every 6 (six) hours as needed. 120 tablet 0   No current facility-administered medications for this visit.    VITAL SIGNS: BP 106/79 (BP Location: Left Arm, Patient Position: Sitting)   Pulse 73   Temp (!) 97 F (36.1 C) (Temporal)   Resp 18   Wt 150 lb 3 oz (68.1 kg)   SpO2 96%   BMI 22.18 kg/m  Filed Weights   12/22/22 1148  Weight: 150 lb 3 oz (68.1 kg)    Estimated body mass index is 22.18 kg/m as calculated from the following:   Height as of 12/11/22: 5\' 9"  (1.753 m).   Weight as of this encounter: 150 lb 3 oz (68.1 kg).   PERFORMANCE STATUS (ECOG) : 1 - Symptomatic but completely ambulatory  Assessment NAD RRR Normal breathing pattern AAO x4  Discussed the use of AI scribe software for clinical note transcription with the patient, who gave verbal consent to proceed.   IMPRESSION:  Terry Page presents to clinic for follow-up. Complains of fatigue with occasional dyspnea. Denies light headedness or dizziness. He also reports some nasal congestion with some blood noticed on the tissue after blowing his nose. The patient denies any fever. States his energy level has been low over the past several days. He is pushing through. Denies signs of infection or UTI. No other reported symptoms. Appetite is good. Sleeping well.   Education provided on seeking medication assistance and contacting office if symptoms worsen. Mr. Lichtenwalner is s/p Cycle 1 of Cyramza and injection. We discussed potential side effects also importance of monitoring for infections and changes. He endorses good appetite and hydration at this time. Shares plans to go home and prepare himself homemade gumbo.   The patient reports his pain is controlled on current regimen. He is tolerating Tramadol  without difficulty. Denies constipation. No changes needed to regimen.   We will continue to closely monitor. He is taking things one day at a time. Patient knows to contact office as needed.  PLAN:  Fatigue New onset over the past two days. No fever reported. Possible congestion. -Labs reviewed. Patient educated on closely monitoring symptoms and when to contact office and seem medical assistance.  Waynetta Sandy, RN made aware.  -Consult with symptom management PA, Jae Dire, for further input.  Cancer Related Pain Patient reports pain is well controlled. No adjustments to current regimen.  -Continue with Tramadol 50-100 mg as needed every 6 hours.   Follow-up Next treatment scheduled for next week  in addition to Oncology follow-up.  -Patient knows to contact office as needed.  -I will plan to see patient back in the clinic in 3-4 weeks.   Patient expressed understanding and was in agreement with this plan. He also understands that He can call the clinic at any time with any questions, concerns, or complaints.   Any controlled substances utilized were prescribed in the context of palliative care. PDMP has been reviewed.    Visit consisted of counseling and education dealing with the complex and emotionally intense issues of symptom management and palliative care in the setting of serious and potentially life-threatening illness.  [name], AGPCNP-BC  Palliative Medicine Team/ Cancer Center

## 2022-12-28 NOTE — Progress Notes (Signed)
 Palliative Medicine [facility]  Telephone:(336) [phone] [phone]) [phone]   Name: Terry Page. Date: 12/28/2022 MRN: [id number]  DOB: August 04, 1955  Patient Care Team: Rolinda Millman, MD as PCP - General (Family Medicine)    INTERVAL HISTORY: Terry Page. is a 67 y.o. 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 He is currently undergoing chemotherapy. Palliative ask to see for symptom management.  SOCIAL HISTORY:    Terry Page reports that he quit smoking about 3 years ago. His smoking use included cigarettes. He has never used smokeless tobacco. He reports current alcohol use of about 12.0 standard drinks of alcohol per week. He reports that he does not use drugs.  ADVANCE DIRECTIVES:  None on file   CODE STATUS: Full Code  PAST MEDICAL HISTORY: Past Medical History:  Diagnosis Date   Alcohol abuse    Asthma    Bullous emphysema ([name])    Essential hypertension 04/10/2020   Hemorrhoids    History of radiation therapy 04/08/20-04/19/20   IMRT- Left lung- Dr. Lynwood Nasuti   Incidental lung nodule, greater than or equal to 8mm 10/22/2017   Left upper lobe - discovered on CTA   Prostate cancer ([name])    Spontaneous pneumothorax 10/20/2017   right   Tobacco abuse     ALLERGIES:  is allergic to morphine  and oxycodone .  MEDICATIONS:  Current Outpatient Medications  Medication Sig Dispense Refill   acetaminophen  (TYLENOL ) 325 MG tablet Take 2 tablets (650 mg total) by mouth every 6 (six) hours as needed for mild pain or headache (fever >/= 101).     albuterol  (VENTOLIN  HFA) 108 (90 Base) MCG/ACT inhaler Inhale 2 puffs into the lungs every 6 (six) hours as needed for wheezing or shortness of breath. 8 g 1   amLODipine  (NORVASC ) 10 MG tablet Take 1 tablet by mouth daily.     atorvastatin  (LIPITOR)  20 MG tablet Take 20 mg by mouth daily.     cyanocobalamin  (VITAMIN B12) 1000 MCG tablet Take 1 tablet (1,000 mcg total) by mouth daily. 90 tablet 1   dexamethasone  (DECADRON ) 4 MG tablet Take 2 tabs by mouth 2 times daily starting day before chemo. Then take 2 tabs daily for 2 days starting day after chemo. Take with food. 30 tablet 1   ferrous sulfate  325 (65 FE) MG EC tablet Take 1 tablet (325 mg total) by mouth daily with breakfast. 30 tablet 3   folic acid  (FOLVITE ) 1 MG tablet Take 1 tablet (1 mg total) by mouth daily. 30 tablet 3   hydrOXYzine  (ATARAX ) 25 MG tablet Take 1 tablet (25 mg total) by mouth at bedtime as needed for itching. 30 tablet 11   ipratropium-albuterol  (DUONEB) 0.5-2.5 (3) MG/3ML SOLN Take 3 mLs by nebulization every 6 (six) hours as needed. (Patient taking differently: Take 3 mLs by nebulization every 6 (six) hours as needed (shortness of breathing or wheezing).) 360 mL 0   lidocaine  (LIDODERM ) 5 % Place 1 patch onto the skin daily. Remove & Discard patch within 12 hours or as directed by MD. Apply to low back 30 patch 0   lidocaine -prilocaine  (EMLA ) cream Apply 1 Application topically as needed. 30 g 0   losartan (COZAAR) 100 MG tablet Take 100 mg by mouth daily.     olmesartan (BENICAR) 40 MG tablet Take 40 mg by mouth daily.  ondansetron  (ZOFRAN ) 8 MG tablet Take 1 tablet (8 mg total) by mouth every 8 (eight) hours as needed for nausea or vomiting. 30 tablet 1   polyethylene glycol (MIRALAX ) 17 g packet Take 17 g by mouth daily. 30 each 2   potassium chloride  SA (KLOR-CON  M) 20 MEQ tablet TAKE 1 TABLET(20 MEQ) BY MOUTH DAILY 90 tablet 0   prochlorperazine  (COMPAZINE ) 10 MG tablet Take 1 tablet (10 mg total) by mouth every 6 (six) hours as needed for nausea or vomiting. 30 tablet 1   senna-docusate (STIMULANT LAXATIVE) 8.6-50 MG tablet Take 2 tablets by mouth 2 (two) times daily. May add additional tablet as needed 150 tablet 3   sodium chloride  1 g tablet Take 1  tablet (1 g total) by mouth 3 (three) times daily with meals. 90 tablet 3   thiamine  100 MG tablet Take 1 tablet (100 mg total) by mouth daily.     traMADol  (ULTRAM ) 50 MG tablet Take 1-2 tablets (50-100 mg total) by mouth every 6 (six) hours as needed. 120 tablet 0   No current facility-administered medications for this visit.    VITAL SIGNS: There were no vitals taken for this visit. There were no vitals filed for this visit.   Estimated body mass index is 22.18 kg/m as calculated from the following:   Height as of 12/11/22: 5' 9 (1.753 m).   Weight as of 12/22/22: 150 lb 3 oz (68.1 kg).   PERFORMANCE STATUS (ECOG) : 1 - Symptomatic but completely ambulatory  Assessment NAD RRR Normal breathing pattern AAO x4  Discussed the use of AI scribe software for clinical note transcription with the patient, who gave verbal consent to proceed.   IMPRESSION:  Terry Page presents to clinic for follow-up. No acute distress noted. He is feeling somewhat better. Continues to endorse some fatigue. Denies nausea, vomiting, constipation, or diarrhea. Continues taking things one day at a time.  The patient reports his pain is controlled on current regimen. He is tolerating Tramadol  without difficulty. Denies constipation. No changes needed to regimen.   We will continue to closely monitor. He is taking things one day at a time. Patient knows to contact office as needed.  PLAN:  Cancer Related Pain Patient reports pain is well controlled. No adjustments to current regimen.  -Continue with Tramadol  50-100 mg as needed every 6 hours.   Follow-up Next treatment scheduled for next week in addition to Oncology follow-up.  -Patient knows to contact office as needed.  -I will plan to see patient back in the clinic in 3-4 weeks.   Patient expressed understanding and was in agreement with this plan. He also understands that He can call the clinic at any time with any questions, concerns, or  complaints.   Any controlled substances utilized were prescribed in the context of palliative care. PDMP has been reviewed.    Visit consisted of counseling and education dealing with the complex and emotionally intense issues of symptom management and palliative care in the setting of serious and potentially life-threatening illness.  Levon Borer, AGPCNP-BC  Palliative Medicine Team/[facility]

## 2022-12-29 ENCOUNTER — Inpatient Hospital Stay: Payer: Medicare HMO

## 2022-12-29 DIAGNOSIS — Z5112 Encounter for antineoplastic immunotherapy: Secondary | ICD-10-CM | POA: Diagnosis not present

## 2022-12-29 DIAGNOSIS — Z95828 Presence of other vascular implants and grafts: Secondary | ICD-10-CM

## 2022-12-29 DIAGNOSIS — C3412 Malignant neoplasm of upper lobe, left bronchus or lung: Secondary | ICD-10-CM

## 2022-12-29 DIAGNOSIS — D649 Anemia, unspecified: Secondary | ICD-10-CM

## 2022-12-29 LAB — CBC WITH DIFFERENTIAL (CANCER CENTER ONLY)
Abs Immature Granulocytes: 1.26 10*3/uL — ABNORMAL HIGH (ref 0.00–0.07)
Basophils Absolute: 0.2 10*3/uL — ABNORMAL HIGH (ref 0.0–0.1)
Basophils Relative: 1 %
Eosinophils Absolute: 0 10*3/uL (ref 0.0–0.5)
Eosinophils Relative: 0 %
HCT: 32.3 % — ABNORMAL LOW (ref 39.0–52.0)
Hemoglobin: 10.8 g/dL — ABNORMAL LOW (ref 13.0–17.0)
Immature Granulocytes: 4 %
Lymphocytes Relative: 3 %
Lymphs Abs: 0.9 10*3/uL (ref 0.7–4.0)
MCH: 29.8 pg (ref 26.0–34.0)
MCHC: 33.4 g/dL (ref 30.0–36.0)
MCV: 89.2 fL (ref 80.0–100.0)
Monocytes Absolute: 0.5 10*3/uL (ref 0.1–1.0)
Monocytes Relative: 2 %
Neutro Abs: 27.1 10*3/uL — ABNORMAL HIGH (ref 1.7–7.7)
Neutrophils Relative %: 90 %
Platelet Count: 411 10*3/uL — ABNORMAL HIGH (ref 150–400)
RBC: 3.62 MIL/uL — ABNORMAL LOW (ref 4.22–5.81)
RDW: 14.2 % (ref 11.5–15.5)
WBC Count: 29.6 10*3/uL — ABNORMAL HIGH (ref 4.0–10.5)
nRBC: 0 % (ref 0.0–0.2)

## 2022-12-29 LAB — SAMPLE TO BLOOD BANK

## 2022-12-29 MED ORDER — SODIUM CHLORIDE 0.9% FLUSH
10.0000 mL | Freq: Once | INTRAVENOUS | Status: AC
  Administered 2022-12-29: 10 mL

## 2022-12-29 MED ORDER — HEPARIN SOD (PORK) LOCK FLUSH 100 UNIT/ML IV SOLN
500.0000 [IU] | Freq: Once | INTRAVENOUS | Status: AC
  Administered 2022-12-29: 500 [IU]

## 2023-01-01 ENCOUNTER — Other Ambulatory Visit: Payer: Self-pay | Admitting: *Deleted

## 2023-01-01 MED ORDER — SODIUM CHLORIDE 1 G PO TABS: 1.0000 g | ORAL_TABLET | Freq: Three times a day (TID) | ORAL | 3 refills | Status: DC

## 2023-01-02 ENCOUNTER — Encounter: Payer: Self-pay | Admitting: Hematology and Oncology

## 2023-01-05 ENCOUNTER — Inpatient Hospital Stay: Payer: Medicare HMO | Attending: Hematology and Oncology

## 2023-01-05 ENCOUNTER — Encounter: Payer: Self-pay | Admitting: Nurse Practitioner

## 2023-01-05 ENCOUNTER — Inpatient Hospital Stay: Payer: Medicare HMO

## 2023-01-05 ENCOUNTER — Inpatient Hospital Stay: Payer: Medicare HMO | Attending: Hematology and Oncology | Admitting: Hematology and Oncology

## 2023-01-05 ENCOUNTER — Inpatient Hospital Stay (HOSPITAL_BASED_OUTPATIENT_CLINIC_OR_DEPARTMENT_OTHER): Payer: Medicare HMO | Admitting: Nurse Practitioner

## 2023-01-05 VITALS — BP 130/78 | HR 94 | Temp 98.3°F | Resp 17 | Ht 69.0 in | Wt 157.6 lb

## 2023-01-05 DIAGNOSIS — Z515 Encounter for palliative care: Secondary | ICD-10-CM | POA: Insufficient documentation

## 2023-01-05 DIAGNOSIS — Z5189 Encounter for other specified aftercare: Secondary | ICD-10-CM | POA: Diagnosis not present

## 2023-01-05 DIAGNOSIS — G893 Neoplasm related pain (acute) (chronic): Secondary | ICD-10-CM | POA: Diagnosis not present

## 2023-01-05 DIAGNOSIS — Z5111 Encounter for antineoplastic chemotherapy: Secondary | ICD-10-CM | POA: Diagnosis present

## 2023-01-05 DIAGNOSIS — Z79891 Long term (current) use of opiate analgesic: Secondary | ICD-10-CM | POA: Diagnosis not present

## 2023-01-05 DIAGNOSIS — C7951 Secondary malignant neoplasm of bone: Secondary | ICD-10-CM

## 2023-01-05 DIAGNOSIS — R530 Neoplastic (malignant) related fatigue: Secondary | ICD-10-CM | POA: Diagnosis not present

## 2023-01-05 DIAGNOSIS — C3412 Malignant neoplasm of upper lobe, left bronchus or lung: Secondary | ICD-10-CM | POA: Diagnosis not present

## 2023-01-05 DIAGNOSIS — C61 Malignant neoplasm of prostate: Secondary | ICD-10-CM | POA: Insufficient documentation

## 2023-01-05 DIAGNOSIS — C3490 Malignant neoplasm of unspecified part of unspecified bronchus or lung: Secondary | ICD-10-CM

## 2023-01-05 DIAGNOSIS — E871 Hypo-osmolality and hyponatremia: Secondary | ICD-10-CM | POA: Insufficient documentation

## 2023-01-05 DIAGNOSIS — Z5112 Encounter for antineoplastic immunotherapy: Secondary | ICD-10-CM | POA: Diagnosis present

## 2023-01-05 DIAGNOSIS — D6481 Anemia due to antineoplastic chemotherapy: Secondary | ICD-10-CM | POA: Insufficient documentation

## 2023-01-05 DIAGNOSIS — Z87891 Personal history of nicotine dependence: Secondary | ICD-10-CM | POA: Insufficient documentation

## 2023-01-05 DIAGNOSIS — I10 Essential (primary) hypertension: Secondary | ICD-10-CM | POA: Diagnosis not present

## 2023-01-05 DIAGNOSIS — T451X5D Adverse effect of antineoplastic and immunosuppressive drugs, subsequent encounter: Secondary | ICD-10-CM | POA: Insufficient documentation

## 2023-01-05 DIAGNOSIS — Z95828 Presence of other vascular implants and grafts: Secondary | ICD-10-CM

## 2023-01-05 DIAGNOSIS — Z79899 Other long term (current) drug therapy: Secondary | ICD-10-CM | POA: Diagnosis not present

## 2023-01-05 DIAGNOSIS — Z923 Personal history of irradiation: Secondary | ICD-10-CM | POA: Diagnosis not present

## 2023-01-05 DIAGNOSIS — D649 Anemia, unspecified: Secondary | ICD-10-CM

## 2023-01-05 LAB — CMP (CANCER CENTER ONLY)
ALT: 20 U/L (ref 0–44)
AST: 13 U/L — ABNORMAL LOW (ref 15–41)
Albumin: 3.6 g/dL (ref 3.5–5.0)
Alkaline Phosphatase: 79 U/L (ref 38–126)
Anion gap: 7 (ref 5–15)
BUN: 19 mg/dL (ref 8–23)
CO2: 24 mmol/L (ref 22–32)
Calcium: 9.1 mg/dL (ref 8.9–10.3)
Chloride: 102 mmol/L (ref 98–111)
Creatinine: 0.78 mg/dL (ref 0.61–1.24)
GFR, Estimated: >60 mL/min (ref >60)
Glucose, Bld: 141 mg/dL — ABNORMAL HIGH (ref 70–99)
Potassium: 4 mmol/L (ref 3.5–5.1)
Sodium: 133 mmol/L — ABNORMAL LOW (ref 135–145)
Total Bilirubin: 0.2 mg/dL (ref 0.0–1.2)
Total Protein: 7.1 g/dL (ref 6.5–8.1)

## 2023-01-05 LAB — CBC WITH DIFFERENTIAL (CANCER CENTER ONLY)
Abs Immature Granulocytes: 1.15 10*3/uL — ABNORMAL HIGH (ref 0.00–0.07)
Basophils Absolute: 0.1 10*3/uL (ref 0.0–0.1)
Basophils Relative: 0 %
Eosinophils Absolute: 0 10*3/uL (ref 0.0–0.5)
Eosinophils Relative: 0 %
HCT: 32.4 % — ABNORMAL LOW (ref 39.0–52.0)
Hemoglobin: 10.9 g/dL — ABNORMAL LOW (ref 13.0–17.0)
Immature Granulocytes: 4 %
Lymphocytes Relative: 4 %
Lymphs Abs: 1 10*3/uL (ref 0.7–4.0)
MCH: 29.9 pg (ref 26.0–34.0)
MCHC: 33.6 g/dL (ref 30.0–36.0)
MCV: 89 fL (ref 80.0–100.0)
Monocytes Absolute: 1 10*3/uL (ref 0.1–1.0)
Monocytes Relative: 4 %
Neutro Abs: 24.4 10*3/uL — ABNORMAL HIGH (ref 1.7–7.7)
Neutrophils Relative %: 88 %
Platelet Count: 420 10*3/uL — ABNORMAL HIGH (ref 150–400)
RBC: 3.64 MIL/uL — ABNORMAL LOW (ref 4.22–5.81)
RDW: 14.8 % (ref 11.5–15.5)
WBC Count: 27.7 10*3/uL — ABNORMAL HIGH (ref 4.0–10.5)
nRBC: 0 % (ref 0.0–0.2)

## 2023-01-05 LAB — SAMPLE TO BLOOD BANK

## 2023-01-05 MED ORDER — SODIUM CHLORIDE 0.9 % IV SOLN
75.0000 mg/m2 | Freq: Once | INTRAVENOUS | Status: AC
  Administered 2023-01-05: 136 mg via INTRAVENOUS
  Filled 2023-01-05: qty 13.6

## 2023-01-05 MED ORDER — DEXAMETHASONE SODIUM PHOSPHATE 10 MG/ML IJ SOLN
10.0000 mg | Freq: Once | INTRAMUSCULAR | Status: AC
  Administered 2023-01-05: 10 mg via INTRAVENOUS
  Filled 2023-01-05: qty 1

## 2023-01-05 MED ORDER — SODIUM CHLORIDE 0.9% FLUSH
10.0000 mL | Freq: Once | INTRAVENOUS | Status: AC
Start: 2023-01-05 — End: 2023-01-05
  Administered 2023-01-05: 10 mL

## 2023-01-05 MED ORDER — DIPHENHYDRAMINE HCL 50 MG/ML IJ SOLN
50.0000 mg | Freq: Once | INTRAMUSCULAR | Status: AC
  Administered 2023-01-05: 50 mg via INTRAVENOUS
  Filled 2023-01-05: qty 1

## 2023-01-05 MED ORDER — HEPARIN SOD (PORK) LOCK FLUSH 100 UNIT/ML IV SOLN
500.0000 [IU] | Freq: Once | INTRAVENOUS | Status: AC | PRN
  Administered 2023-01-05: 500 [IU]

## 2023-01-05 MED ORDER — ACETAMINOPHEN 325 MG PO TABS
650.0000 mg | ORAL_TABLET | Freq: Once | ORAL | Status: AC
Start: 2023-01-05 — End: 2023-01-05
  Administered 2023-01-05: 650 mg via ORAL
  Filled 2023-01-05: qty 2

## 2023-01-05 MED ORDER — SODIUM CHLORIDE 0.9 % IV SOLN: INTRAVENOUS | Status: DC

## 2023-01-05 MED ORDER — SODIUM CHLORIDE 0.9% FLUSH
10.0000 mL | INTRAVENOUS | Status: DC | PRN
  Administered 2023-01-05: 10 mL

## 2023-01-05 MED ORDER — SODIUM CHLORIDE 0.9 % IV SOLN
10.0000 mg/kg | Freq: Once | INTRAVENOUS | Status: AC
  Administered 2023-01-05: 700 mg via INTRAVENOUS
  Filled 2023-01-05: qty 20

## 2023-01-05 NOTE — Patient Instructions (Signed)
 [facility] - A DEPT OF [facility]  Discharge Instructions: Thank you for choosing [facility] to provide your oncology and hematology care.   If you have a lab appointment with the Cancer Center, please go directly to the Cancer Center and check in at the registration area.   Wear comfortable clothing and clothing appropriate for easy access to any Portacath or PICC line.   We strive to give you quality time with your provider. You may need to reschedule your appointment if you arrive late (15 or more minutes).  Arriving late affects you and other patients whose appointments are after yours.  Also, if you miss three or more appointments without notifying the office, you may be dismissed from the clinic at the provider's discretion.      For prescription refill requests, have your pharmacy contact our office and allow 72 hours for refills to be completed.    Today you received the following chemotherapy and/or immunotherapy agents: Ramucirumab (Cyramza) and Docetaxel (Taxotere)      To help prevent nausea and vomiting after your treatment, we encourage you to take your nausea medication as directed.  BELOW ARE SYMPTOMS THAT SHOULD BE REPORTED IMMEDIATELY: *FEVER GREATER THAN 100.4 F (38 C) OR HIGHER *CHILLS OR SWEATING *NAUSEA AND VOMITING THAT IS NOT CONTROLLED WITH YOUR NAUSEA MEDICATION *UNUSUAL SHORTNESS OF BREATH *UNUSUAL BRUISING OR BLEEDING *URINARY PROBLEMS (pain or burning when urinating, or frequent urination) *BOWEL PROBLEMS (unusual diarrhea, constipation, pain near the anus) TENDERNESS IN MOUTH AND THROAT WITH OR WITHOUT PRESENCE OF ULCERS (sore throat, sores in mouth, or a toothache) UNUSUAL RASH, SWELLING OR PAIN  UNUSUAL VAGINAL DISCHARGE OR ITCHING   Items with * indicate a potential emergency and should be followed up as soon as possible or go to the Emergency Department if any problems should occur.  Please show the  CHEMOTHERAPY ALERT CARD or IMMUNOTHERAPY ALERT CARD at check-in to the Emergency Department and triage nurse.  Should you have questions after your visit or need to cancel or reschedule your appointment, please contact [facility] - A DEPT [facility]  Dept: [phone]  and follow the prompts.  Office hours are 8:00 a.m. to 4:30 p.m. Monday - Friday. Please note that voicemails left after 4:00 p.m. may not be returned until the following business day.  We are closed weekends and major holidays. You have access to a nurse at all times for urgent questions. Please call the main number to the clinic Dept: [phone] and follow the prompts.   For any non-urgent questions, you may also contact your provider using MyChart. We now offer e-Visits for anyone 70 and older to request care online for non-urgent symptoms. For details visit [email or web address].   Also download the MyChart app! Go to the app store, search "MyChart", open the app, select [facility], and log in with your MyChart username and password.  Ramucirumab Injection What is this medication? RAMUCIRUMAB (ra mue SIR ue mab) treats some types of cancer. It works by blocking a protein that causes cancer cells to grow and multiply. This helps to slow or stop the spread of cancer cells. It is a monoclonal antibody. This medicine may be used for other purposes; ask your health care provider or pharmacist if you have questions. COMMON BRAND NAME(S): Cyramza What should I tell my care team before I take this medication? They need to know if you  have any of these conditions: Blood clots Having or recent surgery Heart attack High blood pressure History of a tear in your stomach or intestines Liver disease Protein in your urine Stomach bleeding Stroke Thyroid disease An unusual or allergic reaction to ramucirumab, other medications, foods, dyes, or preservatives Pregnant or trying to get  pregnant Breast-feeding How should I use this medication? This medication is injected into a vein. It is given by your care team in a hospital or clinic setting. Talk to your care team about the use of this medication in children. Special care may be needed. Overdosage: If you think you have taken too much of this medicine contact a poison control center or emergency room at once. NOTE: This medicine is only for you. Do not share this medicine with others. What if I miss a dose? Keep appointments for follow-up doses. It is important not to miss your dose. Call your care team if you are unable to keep an appointment. What may interact with this medication? Interactions have not been studied. This list may not describe all possible interactions. Give your health care provider a list of all the medicines, herbs, non-prescription drugs, or dietary supplements you use. Also tell them if you smoke, drink alcohol, or use illegal drugs. Some items may interact with your medicine. What should I watch for while using this medication? Your condition will be monitored carefully while you are receiving this medication. You may need blood work while taking this medication. This medication may make you feel generally unwell. This is not uncommon as chemotherapy can affect health cells as well as cancer cells. Report any side effects. Continue your course of treatment even though you feel ill unless your care team tells you to stop. This medication may increase your risk to bruise or bleed. Call your care team if you notice any unusual bleeding. Before having surgery, talk to your care team to make sure it is ok. This medication can increase the risk of poor healing of your surgical site or wound. You will need to stop this medication for 28 days before surgery. After surgery, wait at least 2 weeks before restarting this medication. Make sure the surgical site or wound is healed enough before restarting this  medication. Talk to your care team if questions. Talk to your care team if you may be pregnant. Serious birth defects can occur if you take this medication during pregnancy and for 3 months after the last dose. You will need a negative pregnancy test before starting this medication. Contraception is recommended while taking this medication and for 3 months after the last dose. Your care team can help you find the option that works for you. Do not breastfeed while taking this medication and for 2 months after the last dose. This medication may cause infertility. Talk to your care team if you are concerned about your fertility. What side effects may I notice from receiving this medication? Side effects that you should report to your care team as soon as possible: Allergic reactions--skin rash, itching, hives, swelling of the face, lips, tongue, or throat Bleeding--bloody or black, tar-like stools, vomiting blood or brown material that looks like coffee grounds, red or dark brown urine, small red or purple spots on skin, unusual bruising or bleeding Dizziness, loss of balance or coordination, confusion or trouble speaking Heart attack--pain or tightness in the chest, shoulders, arms, or jaw, nausea, shortness of breath, cold or clammy skin, feeling faint or lightheaded Increase in blood pressure  Infection--fever, chills, cough, sore throat, wounds that don't heal, pain or trouble when passing urine, general feeling of discomfort or being unwell Infusion reactions--chest pain, shortness of breath or trouble breathing, feeling faint or lightheaded Kidney injury--decrease in the amount of urine, swelling of the ankles, hands, or feet Liver injury--right upper belly pain, loss of appetite, nausea, light-colored stool, dark yellow or brown urine, yellowing skin or eyes, unusual weakness or fatigue Low thyroid levels (hypothyroidism)--unusual weakness or fatigue, increased sensitivity to cold, constipation,  hair loss, dry skin, weight gain, feelings of depression Stomach pain that is severe, does not go away, or gets worse Stroke--sudden numbness or weakness of the face, arm, or leg, trouble speaking, confusion, trouble walking, loss of balance or coordination, dizziness, severe headache, change in vision Sudden and severe headache, confusion, change in vision, seizures, which may be signs of posterior reversible encephalopathy syndrome (PRES) Side effects that usually do not require medical attention (report to your care team if they continue or are bothersome): Diarrhea Fatigue Stomach pain Swelling of the ankles, hands, or feet This list may not describe all possible side effects. Call your doctor for medical advice about side effects. You may report side effects to FDA at 1-800-FDA-1088. Where should I keep my medication? This medication is given in a hospital or clinic. It will not be stored at home. NOTE: This sheet is a summary. It may not cover all possible information. If you have questions about this medicine, talk to your doctor, pharmacist, or health care provider.  2024 Elsevier/Gold Standard (2021-05-12 00:00:00) Docetaxel Injection What is this medication? DOCETAXEL (doe se TAX el) treats some types of cancer. It works by slowing down the growth of cancer cells. This medicine may be used for other purposes; ask your health care provider or pharmacist if you have questions. COMMON BRAND NAME(S): Docefrez, Docivyx, Taxotere What should I tell my care team before I take this medication? They need to know if you have any of these conditions: Kidney disease Liver disease Low white blood cell levels Tingling of the fingers or toes or other nerve disorder An unusual or allergic reaction to docetaxel, polysorbate 80, other medications, foods, dyes, or preservatives Pregnant or trying to get pregnant Breast-feeding How should I use this medication? This medication is injected into a  vein. It is given by your care team in a hospital or clinic setting. Talk to your care team about the use of this medication in children. Special care may be needed. Overdosage: If you think you have taken too much of this medicine contact a poison control center or emergency room at once. NOTE: This medicine is only for you. Do not share this medicine with others. What if I miss a dose? Keep appointments for follow-up doses. It is important not to miss your dose. Call your care team if you are unable to keep an appointment. What may interact with this medication? Do not take this medication with any of the following: Live virus vaccines This medication may also interact with the following: Certain antibiotics, such as clarithromycin, telithromycin Certain antivirals for HIV or hepatitis Certain medications for fungal infections, such as itraconazole, ketoconazole, voriconazole Grapefruit juice Nefazodone Supplements, such as St. John's wort This list may not describe all possible interactions. Give your health care provider a list of all the medicines, herbs, non-prescription drugs, or dietary supplements you use. Also tell them if you smoke, drink alcohol, or use illegal drugs. Some items may interact with your medicine. What should  I watch for while using this medication? This medication may make you feel generally unwell. This is not uncommon as chemotherapy can affect healthy cells as well as cancer cells. Report any side effects. Continue your course of treatment even though you feel ill unless your care team tells you to stop. You may need blood work done while you are taking this medication. This medication can cause serious side effects and infusion reactions. To reduce the risk, your care team may give you other medications to take before receiving this one. Be sure to follow the directions from your care team. This medication may increase your risk of getting an infection. Call your  care team for advice if you get a fever, chills, sore throat, or other symptoms of a cold or flu. Do not treat yourself. Try to avoid being around people who are sick. Avoid taking medications that contain aspirin, acetaminophen, ibuprofen, naproxen, or ketoprofen unless instructed by your care team. These medications may hide a fever. Be careful brushing or flossing your teeth or using a toothpick because you may get an infection or bleed more easily. If you have any dental work done, tell your dentist you are receiving this medication. Some products may contain alcohol. Ask your care team if this medication contains alcohol. Be sure to tell all care teams you are taking this medicine. Certain medications, like metronidazole and disulfiram, can cause an unpleasant reaction when taken with alcohol. The reaction includes flushing, headache, nausea, vomiting, sweating, and increased thirst. The reaction can last from 30 minutes to several hours. This medication may affect your coordination, reaction time, or judgement. Do not drive or operate machinery until you know how this medication affects you. Sit up or stand slowly to reduce the risk of dizzy or fainting spells. Drinking alcohol with this medication can increase the risk of these side effects. Talk to your care team about your risk of cancer. You may be more at risk for certain types of cancer if you take this medication. Talk to your care team if you wish to become pregnant or think you might be pregnant. This medication can cause serious birth defects if taken during pregnancy or if you get pregnant within 2 months after stopping therapy. A negative pregnancy test is required before starting this medication. A reliable form of contraception is recommended while taking this medication and for 2 months after stopping it. Talk to your care team about reliable forms of contraception. Do not breast-feed while taking this medication and for 1 week after  stopping therapy. Use a condom during sex and for 4 months after stopping therapy. Tell your care team right away if you think your partner might be pregnant. This medication can cause serious birth defects. This medication may cause infertility. Talk to your care team if you are concerned about your fertility. What side effects may I notice from receiving this medication? Side effects that you should report to your care team as soon as possible: Allergic reactions--skin rash, itching, hives, swelling of the face, lips, tongue, or throat Change in vision such as blurry vision, seeing halos around lights, vision loss Infection--fever, chills, cough, or sore throat Infusion reactions--chest pain, shortness of breath or trouble breathing, feeling faint or lightheaded Low red blood cell level--unusual weakness or fatigue, dizziness, headache, trouble breathing Pain, tingling, or numbness in the hands or feet Painful swelling, warmth, or redness of the skin, blisters or sores at the infusion site Redness, blistering, peeling, or loosening of the  skin, including inside the mouth Sudden or severe stomach pain, bloody diarrhea, fever, nausea, vomiting Swelling of the ankles, hands, or feet Tumor lysis syndrome (TLS)--nausea, vomiting, diarrhea, decrease in the amount of urine, dark urine, unusual weakness or fatigue, confusion, muscle pain or cramps, fast or irregular heartbeat, joint pain Unusual bruising or bleeding Side effects that usually do not require medical attention (report to your care team if they continue or are bothersome): Change in nail shape, thickness, or color Change in taste Hair loss Increased tears This list may not describe all possible side effects. Call your doctor for medical advice about side effects. You may report side effects to FDA at 1-800-FDA-1088. Where should I keep my medication? This medication is given in a hospital or clinic. It will not be stored at  home. NOTE: This sheet is a summary. It may not cover all possible information. If you have questions about this medicine, talk to your doctor, pharmacist, or health care provider.  2024 Elsevier/Gold Standard (2021-02-24 00:00:00)

## 2023-01-05 NOTE — Progress Notes (Signed)
 [facility] [phone]   [phone]  PROGRESS NOTE  Patient Care Team: Rolinda Millman, MD as PCP - General (Family Medicine)  Hematological/Oncological History # Metastatic Adenocarcinoma of the Lung  01/07/2020 : CT of the chest with contrast performed which showed interval enlargement of a spiculated nodule in the central left upper lobe measuring 1.2 x 1.0 cm and highly suspicious for primary lung malignancy 02/06/2020 :PET scan performed which showed a hypermetabolic irregular solid 1.3 cm left upper lobe pulmonary nodule compatible with malignancy without any other hypermetabolic lesions 05/04/7975- 04/19/2020:  SBRT to the lung lesion 04/28/2021: CT C/A/P showed multifocal lytic bone metastases are identified. Lesion within the spine of the left scapula and lesions involving bilateral iliac bones and left sacral wing. Establish care with Dr. Federico  05/03/2021: biopsy of right iliac lytic lesion showed metastatic carcinoma, consistent with lung primary.  07/08/2021: Cycle 1 Day 1 of Carbo/Pem/Pem 07/29/2021: Cycle 2 Day 1 of Carbo/Pem/Pem 08/11/2021-08/24/2021: Received palliative radiation to osseous metastasis in the left shoulder. 30 Gy in 10 fractions.  08/19/2021: Cycle 3 Day 1 of Carbo/Pem/Pem 09/09/2021: Cycle 4 Day 1 of Carbo/Pem/Pem 09/23/2021: CT CAP: stable disease 09/30/2021: Cycle 5 Day 1 of Pem/Pem 10/21/2021: Cycle 6 Day 1 of Pembrolizumab . Held pemetrexed  due to anemia. 11/16/2021: Cycle 7 Day 1 of Pem/Pem.   12/16/2021: Cycle 8 Day 1 of Pem/Pem.   01/06/2022: Cycle 9 Day 1 of Pem/Pem.  01/27/2022: Cycle 10 Day 1 of Pem/Pem 02/17/2022: Cycle 11 Day 1 of Pem/Pem 03/10/2022: Cycle 12 Day 1 of Pem/Pem (HELD due to anemia with Hgb of 6.4) 03/17/2022: Cycle 12 Day 1 of Pem/Pem (HELD pemextrexed due to cytopenias/kidney function) 04/10/2022: Cycle 13 Day 1 of Pem/Pem (HELD pemextrexed due to cytopenias/kidney function) 04/28/2022: Cycle 14 Day 1 of Pem/Pem  (HELD pemextrexed due to cytopenias/kidney function) 05/23/2022:  Cycle 15 Day 1 of Pem/Pem (HELD pemextrexed due to cytopenias/kidney function) 06/15/2022: Cycle 16 Day 1 of Pembrolizumab .  07/07/2022: Cycle 17 Day 1 of Pembrolizumab .  07/28/2022: Cycle 18 Day 1 of Pembrolizumab  08/18/2022: Cycle 19 Day 1 of Pembrolizumab  HELD due to hyponatremia of 118, new bone metastases. 08/25/2022: Cycle 19 Day 1 of Pembrolizumab  08/28/2022-09/13/2022: Received palliative radiation to right pelvis with total dose of 30 Gy in 10 Fx.  09/22/2022:  Cycle 20 Day 1 of Pembrolizumab  10/13/2022:  Cycle 21 Day 1 of Pembrolizumab  12/11/2022: Cycle 1 Day 1 of Docetaxel /Ramucircumab.  01/05/2023: Cycle 2 Day 1 of Docetaxel /Ramucircumab.   #Adenocarcinoma of the Prostate, T1cN0M0 08/19/2019-10/07/2019: 70 Gy in 28 fractions of 2.5 Gy.  Radiation to the prostate was under the care of Dr. Donnice Barge  Interval History:  Terry Page. 68 y.o. male with medical history significant for metastatic adenocarcinoma of the lung who presents for a follow up visit. The patient's last visit was on 12/13/2022. In the interim since the last visit he completed Cycle 1 of Docetaxel /Ramucircumab.   On exam today, Terry Page reports he tolerated his first cycle of chemotherapy quite well.  He reports his major symptom has been some hair loss.  He reports he is having some tingling in his toes and heels but no pain.  He is not having any difficulties with his hands.  He reports that his appetite remains strong and his energy levels are good.  His weight is stable at 157 pounds.  Otherwise he is had no other major changes in his health and feels well.  Overall he feels like his pain  is manageable and his chemotherapy treatment so far has been tolerable.  He denies fevers, chills, sweats, shortness of breath, chest pain, cough, headaches or dizziness. He has no other complaints. A full 10 point ROS is listed below.  MEDICAL HISTORY:  Past  Medical History:  Diagnosis Date   Alcohol abuse    Asthma    Bullous emphysema ([name])    Essential hypertension 04/10/2020   Hemorrhoids    History of radiation therapy 04/08/20-04/19/20   IMRT- Left lung- Dr. Lynwood Nasuti   Incidental lung nodule, greater than or equal to 8mm 10/22/2017   Left upper lobe - discovered on CTA   Prostate cancer ([name])    Spontaneous pneumothorax 10/20/2017   right   Tobacco abuse     SURGICAL HISTORY: Past Surgical History:  Procedure Laterality Date   BACK SURGERY     IR IMAGING GUIDED PORT INSERTION  06/29/2021   PROSTATE BIOPSY      SOCIAL HISTORY: Social History   Socioeconomic History   Marital status: Single    Spouse name: Not on file   Number of children: Not on file   Years of education: Not on file   Highest education level: Not on file  Occupational History   Occupation: retired  Tobacco Use   Smoking status: Former    Current packs/day: 0.00    Types: Cigarettes    Quit date: 11/22/2019    Years since quitting: 3.1   Smokeless tobacco: Never   Tobacco comments:    Patient reports quit 3 years ago. 06/25/20. [facility]  Vaping Use   Vaping status: Never Used  Substance and Sexual Activity   Alcohol use: Yes    Alcohol/week: 12.0 standard drinks of alcohol    Types: 12 Cans of beer per week    Comment: daily sometimes   Drug use: No   Sexual activity: Not Currently  Other Topics Concern   Not on file  Social History Narrative   Not on file   Social Drivers of Health   Financial Resource Strain: Not on file  Food Insecurity: Not on file  Transportation Needs: Unmet Transportation Needs (08/18/2022)   PRAPARE - Administrator, Civil Service (Medical): Yes    Lack of Transportation (Non-Medical): No  Physical Activity: Not on file  Stress: Not on file  Social Connections: Not on file  Intimate Partner Violence: Not on file    FAMILY HISTORY: Family History  Problem Relation Age of Onset   Cancer Cousin         maternal cousin   Cancer Cousin        paternal cousin   Cancer Cousin    Colon polyps Neg Hx    Pancreatic disease Neg Hx    Pancreatic cancer Neg Hx    Breast cancer Neg Hx    Colon cancer Neg Hx     ALLERGIES:  is allergic to morphine  and oxycodone .  MEDICATIONS:  Current Outpatient Medications  Medication Sig Dispense Refill   acetaminophen  (TYLENOL ) 325 MG tablet Take 2 tablets (650 mg total) by mouth every 6 (six) hours as needed for mild pain or headache (fever >/= 101).     albuterol  (VENTOLIN  HFA) 108 (90 Base) MCG/ACT inhaler Inhale 2 puffs into the lungs every 6 (six) hours as needed for wheezing or shortness of breath. 8 g 1   amLODipine  (NORVASC ) 10 MG tablet Take 1 tablet by mouth daily.     atorvastatin  (LIPITOR) 20 MG tablet  Take 20 mg by mouth daily.     cyanocobalamin  (VITAMIN B12) 1000 MCG tablet Take 1 tablet (1,000 mcg total) by mouth daily. 90 tablet 1   dexamethasone  (DECADRON ) 4 MG tablet Take 2 tabs by mouth 2 times daily starting day before chemo. Then take 2 tabs daily for 2 days starting day after chemo. Take with food. 30 tablet 1   ferrous sulfate  325 (65 FE) MG EC tablet Take 1 tablet (325 mg total) by mouth daily with breakfast. 30 tablet 3   folic acid  (FOLVITE ) 1 MG tablet Take 1 tablet (1 mg total) by mouth daily. 30 tablet 3   hydrOXYzine  (ATARAX ) 25 MG tablet Take 1 tablet (25 mg total) by mouth at bedtime as needed for itching. 30 tablet 11   ipratropium-albuterol  (DUONEB) 0.5-2.5 (3) MG/3ML SOLN Take 3 mLs by nebulization every 6 (six) hours as needed. (Patient taking differently: Take 3 mLs by nebulization every 6 (six) hours as needed (shortness of breathing or wheezing).) 360 mL 0   lidocaine  (LIDODERM ) 5 % Place 1 patch onto the skin daily. Remove & Discard patch within 12 hours or as directed by MD. Apply to low back 30 patch 0   lidocaine -prilocaine  (EMLA ) cream Apply 1 Application topically as needed. 30 g 0   losartan (COZAAR) 100 MG  tablet Take 100 mg by mouth daily.     olmesartan (BENICAR) 40 MG tablet Take 40 mg by mouth daily.     ondansetron  (ZOFRAN ) 8 MG tablet Take 1 tablet (8 mg total) by mouth every 8 (eight) hours as needed for nausea or vomiting. 30 tablet 1   polyethylene glycol (MIRALAX ) 17 g packet Take 17 g by mouth daily. 30 each 2   potassium chloride  SA (KLOR-CON  M) 20 MEQ tablet TAKE 1 TABLET(20 MEQ) BY MOUTH DAILY 90 tablet 0   prochlorperazine  (COMPAZINE ) 10 MG tablet Take 1 tablet (10 mg total) by mouth every 6 (six) hours as needed for nausea or vomiting. 30 tablet 1   senna-docusate (STIMULANT LAXATIVE) 8.6-50 MG tablet Take 2 tablets by mouth 2 (two) times daily. May add additional tablet as needed 150 tablet 3   sodium chloride  1 g tablet Take 1 tablet (1 g total) by mouth 3 (three) times daily with meals. 90 tablet 3   thiamine  100 MG tablet Take 1 tablet (100 mg total) by mouth daily.     traMADol  (ULTRAM ) 50 MG tablet Take 1-2 tablets (50-100 mg total) by mouth every 6 (six) hours as needed. 120 tablet 0   No current facility-administered medications for this visit.    REVIEW OF SYSTEMS:   Constitutional: ( - ) fevers, ( - )  chills , ( - ) night sweats Eyes: ( - ) blurriness of vision, ( - ) double vision, ( - ) watery eyes Ears, nose, mouth, throat, and face: ( - ) mucositis, ( - ) sore throat Respiratory: ( - ) cough, ( - ) dyspnea, ( - ) wheezes Cardiovascular: ( - ) palpitation, ( - ) chest discomfort, ( - ) lower extremity swelling Gastrointestinal:  ( - ) nausea, ( - ) heartburn, ( - ) change in bowel habits Skin: ( - ) abnormal skin rashes Lymphatics: ( - ) new lymphadenopathy, ( - ) easy bruising Neurological: ( - ) numbness, ( - ) tingling, ( - ) new weaknesses Behavioral/Psych: ( - ) mood change, ( - ) new changes  All other systems were reviewed with the patient and are  negative.  PHYSICAL EXAMINATION: ECOG PERFORMANCE STATUS: 1 - Symptomatic but completely ambulatory  There  were no vitals filed for this visit.  There were no vitals filed for this visit.   GENERAL: Well-appearing elderly African-American male, alert, no distress and comfortable SKIN: skin color, texture, turgor are normal, no rashes or significant lesions EYES: conjunctiva are pink and non-injected, sclera clear LUNGS: clear to auscultation and percussion with normal breathing effort HEART: regular rate & rhythm and no murmurs and no lower extremity edema Musculoskeletal: no cyanosis of digits and no clubbing  PSYCH: alert & oriented x 3, fluent speech NEURO: no focal motor/sensory deficits  LABORATORY DATA:  I have reviewed the data as listed    Latest Ref Rng & Units 01/05/2023   11:40 AM 12/29/2022   11:05 AM 12/22/2022   11:22 AM  CBC  WBC 4.0 - 10.5 K/uL 27.7  29.6  32.2   Hemoglobin 13.0 - 17.0 g/dL 89.0  89.1  89.2   Hematocrit 39.0 - 52.0 % 32.4  32.3  31.3   Platelets 150 - 400 K/uL 420  411  220        Latest Ref Rng & Units 01/05/2023   11:40 AM 12/22/2022   11:22 AM 12/11/2022    8:56 AM  CMP  Glucose 70 - 99 mg/dL 858  879  846   BUN 8 - 23 mg/dL 19  15  14    Creatinine 0.61 - 1.24 mg/dL 9.21  8.95  8.82   Sodium 135 - 145 mmol/L 133  134  128   Potassium 3.5 - 5.1 mmol/L 4.0  4.0  4.0   Chloride 98 - 111 mmol/L 102  104  96   CO2 22 - 32 mmol/L 24  23  22    Calcium  8.9 - 10.3 mg/dL 9.1  9.0  9.9   Total Protein 6.5 - 8.1 g/dL 7.1  6.8  9.0   Total Bilirubin 0.0 - 1.2 mg/dL 0.2  0.2  0.3   Alkaline Phos 38 - 126 U/L 79  154  83   AST 15 - 41 U/L 13  60  15   ALT 0 - 44 U/L 20  59  9     Lab Results  Component Value Date   MPROTEIN Not Observed 04/28/2021   Lab Results  Component Value Date   KPAFRELGTCHN 33.6 (H) 04/28/2021   LAMBDASER 22.0 04/28/2021   KAPLAMBRATIO 1.53 04/28/2021    RADIOGRAPHIC STUDIES: No results found.  ASSESSMENT & PLAN Terry Page. Is a 68 y.o.  male with medical history significant for metastatic adenocarcinoma of  the lung who presents for a follow up visit.  # Metastatic Adenocarcinoma of the Lung  -- NGS testing from this patient shows no evidence of targetable mutation. --MRI brain shows no evidence of intracranial metastases --Started carboplatin , pembrolizumab , and pemetrexed  as treatment for his cancer on 07/08/2021. --Received palliative radiation to osseous metastasis of the left shoulder from 08/11/2021-08/24/2021. Planned dose is 30 Gy in 10 fx.  --Discontinued Pemetrexed  on 03/17/2022 due to persistent anemia and kidney dysfunction. --Restaging CT CAP from 08/09/2022 shows interval enlargement of multiple mixed lytic and sclerotic bone metastases involving the right pelvis. Other bone metastases including of the left scapula, sacrum, and left hemipelvis are unchanged. Unchanged treated central LUL mass. Recommend to continue on pembrolizumab  therapy and arrange for palliative radiation to enlarging bone metastases.  Plan: --today is Cycle 2 Day 1 of Docetaxel /Ramucirumab .  --labs from today  were reviewed. Labs show white blood cell count 27.7, hemoglobin 10.9, MCV 89.0, platelets 420. Creatinine and LFTs normal.  --Plan for repeat CT imaging around Feb 2025. Last scan in Nov 2024 showed progression of disease.  --RTC in 3 week with labs and follow up visit before Cycle 2. Interval weekly labs.   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  --Pain is slightly improved but still present.  --Continue to take tramadol  q 6 hours for pain control.   #Hyponatremia-improved --Chronic in nature --Sodium level dropped from 127 on 08/09/2022 to 118.  Today sodium improved to 133 --Etiologies including SIADH, immunotherapy induced, too much water intake.  --Patient is asymptomatic without nausea/vomiting, headaches, confusion,  seizures, etc --Currently on salt tablets 2 gm PO daily. We advised to titrate to twice daily x 7 days and then three times daily.  --Strict ED precautions given for any new symptoms as discussed above.   # Iron Deficiency Anemia #Chemotherapy induced anemia --Hgb level 10.9 today.  --Currently on ferrous sulfate  325 mg PO daily  #Pain Control --stopped oxycodone  due to itching --currently following with Palliative care.  --pain well controlled with tramadol  as needed. Continue to take tramdol 50-100 mg q 6 hours as needed. --Continue senna docusate for constipation prophylaxis --Continue to monitor  #Supportive Care -- chemotherapy education complete -- port placed -- zofran  8mg  q8H PRN and compazine  10mg  PO q6H for nausea -- EMLA  cream for port -- Patient referred to palliative care for pain control. Pain control as above.    Orders Placed This Encounter  Procedures   CBC with Differential (Cancer Center Only)    Standing Status:   Future    Expected Date:   01/26/2023    Expiration Date:   01/26/2024   CMP (Cancer Center only)    Standing Status:   Future    Expected Date:   01/26/2023    Expiration Date:   01/26/2024   T4    Standing Status:   Future    Expected Date:   01/26/2023    Expiration Date:   01/26/2024   TSH    Standing Status:   Future    Expected Date:   01/26/2023    Expiration Date:   01/26/2024   Total Protein, Urine dipstick    Standing Status:   Future    Expected Date:   01/26/2023    Expiration Date:   01/26/2024   CBC with Differential (Cancer Center Only)    Standing Status:   Future    Expected Date:   02/16/2023    Expiration Date:   02/16/2024   CMP (Cancer Center only)    Standing Status:   Future    Expected Date:   02/16/2023    Expiration Date:   02/16/2024   CBC with Differential (Cancer Center Only)    Standing Status:   Future    Expected Date:   03/09/2023    Expiration Date:   03/08/2024   CMP (Cancer Center only)    Standing Status:    Future    Expected Date:   03/09/2023    Expiration Date:   03/08/2024   CBC with Differential (Cancer Center Only)    Standing Status:   Future    Expected Date:   03/30/2023    Expiration Date:   03/29/2024  CMP (Cancer Center only)    Standing Status:   Future    Expected Date:   03/30/2023    Expiration Date:   03/29/2024   T4    Standing Status:   Future    Expected Date:   03/30/2023    Expiration Date:   03/29/2024   TSH    Standing Status:   Future    Expected Date:   03/30/2023    Expiration Date:   03/29/2024   Total Protein, Urine dipstick    Standing Status:   Future    Expected Date:   03/30/2023    Expiration Date:   03/29/2024    All questions were answered. The patient knows to call the clinic with any problems, questions or concerns.  I have spent a total of 30 minutes minutes of face-to-face and non-face-to-face time, preparing to see the patient, performing a medically appropriate examination, counseling and educating the patient, referring and communicating with other health care professionals, documenting clinical information in the electronic health record, and care coordination.   Norleen IVAR Kidney, MD Department of Hematology/Oncology [facility] Phone: [phone] Pager: [phone] Email: [email or web address]  01/07/2023 11:39 AM

## 2023-01-07 ENCOUNTER — Encounter: Payer: Self-pay | Admitting: Hematology and Oncology

## 2023-01-08 ENCOUNTER — Inpatient Hospital Stay: Payer: Medicare HMO

## 2023-01-08 VITALS — BP 107/69 | HR 118 | Temp 98.1°F | Resp 18

## 2023-01-08 DIAGNOSIS — C3412 Malignant neoplasm of upper lobe, left bronchus or lung: Secondary | ICD-10-CM

## 2023-01-08 DIAGNOSIS — Z5112 Encounter for antineoplastic immunotherapy: Secondary | ICD-10-CM | POA: Diagnosis not present

## 2023-01-08 MED ORDER — PEGFILGRASTIM-JMDB 6 MG/0.6ML ~~LOC~~ SOSY
6.0000 mg | PREFILLED_SYRINGE | Freq: Once | SUBCUTANEOUS | Status: AC
Start: 2023-01-08 — End: 2023-01-08
  Administered 2023-01-08: 6 mg via SUBCUTANEOUS
  Filled 2023-01-08: qty 0.6

## 2023-01-09 ENCOUNTER — Telehealth: Payer: Self-pay

## 2023-01-09 ENCOUNTER — Encounter: Payer: Self-pay | Admitting: Hematology and Oncology

## 2023-01-09 ENCOUNTER — Other Ambulatory Visit (HOSPITAL_COMMUNITY): Payer: Self-pay

## 2023-01-09 NOTE — Telephone Encounter (Signed)
 Pharmacy Patient Advocate Encounter   Received notification from CoverMyMeds that prior authorization for hydrOXYzine  HCl 25MG  tablets is required/requested.   Insurance verification completed.   The patient is insured through [employer] [facility] [employer].   Per test claim: PA required; PA submitted to above mentioned insurance via CoverMyMeds Key/confirmation #/EOC [id number] Status is pending

## 2023-01-11 NOTE — Telephone Encounter (Signed)
 Pharmacy Patient Advocate Encounter  Received notification from [employer] [facility] Medicare that Prior Authorization for hydrOXYzine HCl 25MG  tablets has been APPROVED from 01-09-2023 to 01-02-2024   PA #/Case ID/Reference #: [id number]

## 2023-01-12 ENCOUNTER — Other Ambulatory Visit: Payer: Self-pay | Admitting: Hematology and Oncology

## 2023-01-12 ENCOUNTER — Inpatient Hospital Stay: Payer: Medicare HMO

## 2023-01-12 DIAGNOSIS — C3412 Malignant neoplasm of upper lobe, left bronchus or lung: Secondary | ICD-10-CM

## 2023-01-12 DIAGNOSIS — D649 Anemia, unspecified: Secondary | ICD-10-CM

## 2023-01-12 DIAGNOSIS — Z5112 Encounter for antineoplastic immunotherapy: Secondary | ICD-10-CM | POA: Diagnosis not present

## 2023-01-12 DIAGNOSIS — Z95828 Presence of other vascular implants and grafts: Secondary | ICD-10-CM

## 2023-01-12 LAB — CMP (CANCER CENTER ONLY)
ALT: 13 U/L (ref 0–44)
AST: 11 U/L — ABNORMAL LOW (ref 15–41)
Albumin: 3.4 g/dL — ABNORMAL LOW (ref 3.5–5.0)
Alkaline Phosphatase: 91 U/L (ref 38–126)
Anion gap: 5 (ref 5–15)
BUN: 10 mg/dL (ref 8–23)
CO2: 29 mmol/L (ref 22–32)
Calcium: 9.4 mg/dL (ref 8.9–10.3)
Chloride: 98 mmol/L (ref 98–111)
Creatinine: 0.9 mg/dL (ref 0.61–1.24)
GFR, Estimated: >60 mL/min (ref >60)
Glucose, Bld: 97 mg/dL (ref 70–99)
Potassium: 4.4 mmol/L (ref 3.5–5.1)
Sodium: 132 mmol/L — ABNORMAL LOW (ref 135–145)
Total Bilirubin: 0.4 mg/dL (ref 0.0–1.2)
Total Protein: 6.6 g/dL (ref 6.5–8.1)

## 2023-01-12 LAB — CBC WITH DIFFERENTIAL (CANCER CENTER ONLY)
Abs Immature Granulocytes: 0.91 10*3/uL — ABNORMAL HIGH (ref 0.00–0.07)
Basophils Absolute: 0 10*3/uL (ref 0.0–0.1)
Basophils Relative: 1 %
Eosinophils Absolute: 0.2 10*3/uL (ref 0.0–0.5)
Eosinophils Relative: 4 %
HCT: 28.2 % — ABNORMAL LOW (ref 39.0–52.0)
Hemoglobin: 9.5 g/dL — ABNORMAL LOW (ref 13.0–17.0)
Immature Granulocytes: 14 %
Lymphocytes Relative: 15 %
Lymphs Abs: 1 10*3/uL (ref 0.7–4.0)
MCH: 29.5 pg (ref 26.0–34.0)
MCHC: 33.7 g/dL (ref 30.0–36.0)
MCV: 87.6 fL (ref 80.0–100.0)
Monocytes Absolute: 1.6 10*3/uL — ABNORMAL HIGH (ref 0.1–1.0)
Monocytes Relative: 24 %
Neutro Abs: 2.8 10*3/uL (ref 1.7–7.7)
Neutrophils Relative %: 42 %
Platelet Count: 160 10*3/uL (ref 150–400)
RBC: 3.22 MIL/uL — ABNORMAL LOW (ref 4.22–5.81)
RDW: 14.7 % (ref 11.5–15.5)
WBC Count: 6.6 10*3/uL (ref 4.0–10.5)
nRBC: 0 % (ref 0.0–0.2)

## 2023-01-12 LAB — RETIC PANEL
RBC.: 3.23 MIL/uL — ABNORMAL LOW (ref 4.22–5.81)
Retic Ct Pct: <0.4 % — ABNORMAL LOW (ref 0.4–3.1)
Reticulocyte Hemoglobin: 35.5 pg (ref >27.9)

## 2023-01-12 LAB — IRON AND IRON BINDING CAPACITY (CC-WL,HP ONLY)
Iron: 31 ug/dL — ABNORMAL LOW (ref 45–182)
Saturation Ratios: 9 % — ABNORMAL LOW (ref 17.9–39.5)
TIBC: 329 ug/dL (ref 250–450)
UIBC: 298 ug/dL (ref 117–376)

## 2023-01-12 LAB — SAMPLE TO BLOOD BANK

## 2023-01-12 LAB — VITAMIN B12: Vitamin B-12: 1094 pg/mL — ABNORMAL HIGH (ref 180–914)

## 2023-01-12 LAB — FERRITIN: Ferritin: 1254 ng/mL — ABNORMAL HIGH (ref 24–336)

## 2023-01-12 MED ORDER — SODIUM CHLORIDE 0.9% FLUSH
10.0000 mL | Freq: Once | INTRAVENOUS | Status: AC
  Administered 2023-01-12: 10 mL

## 2023-01-12 MED ORDER — HEPARIN SOD (PORK) LOCK FLUSH 100 UNIT/ML IV SOLN
500.0000 [IU] | Freq: Once | INTRAVENOUS | Status: AC
  Administered 2023-01-12: 500 [IU]

## 2023-01-15 LAB — METHYLMALONIC ACID, SERUM: Methylmalonic Acid, Quantitative: 157 nmol/L (ref 0–378)

## 2023-01-17 ENCOUNTER — Other Ambulatory Visit: Payer: Self-pay

## 2023-01-17 DIAGNOSIS — Z515 Encounter for palliative care: Secondary | ICD-10-CM

## 2023-01-17 DIAGNOSIS — C7951 Secondary malignant neoplasm of bone: Secondary | ICD-10-CM

## 2023-01-17 DIAGNOSIS — G893 Neoplasm related pain (acute) (chronic): Secondary | ICD-10-CM

## 2023-01-17 MED ORDER — TRAMADOL HCL 50 MG PO TABS: 50.0000 mg | ORAL_TABLET | Freq: Four times a day (QID) | ORAL | 0 refills | Status: DC | PRN

## 2023-01-17 NOTE — Telephone Encounter (Signed)
 Pt called for medication refill. See associated orders.

## 2023-01-23 NOTE — Progress Notes (Signed)
Palliative Medicine [facility]  Telephone:(336) [phone] Fax:(336) [phone]   Name: Terry Page. Date: 01/23/2023 MRN: [id number]  DOB: [birth year]  Patient Care Team: Aliene Beams, MD as PCP - General (Family Medicine)    INTERVAL HISTORY: Terry Page. is a 68 y.o. 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 He is currently undergoing chemotherapy. Palliative ask to see for symptom management.  SOCIAL HISTORY:    Terry Page reports that he quit smoking about 3 years ago. His smoking use included cigarettes. He has never used smokeless tobacco. He reports current alcohol use of about 12.0 standard drinks of alcohol per week. He reports that he does not use drugs.  ADVANCE DIRECTIVES:  None on file   CODE STATUS: Full Code  PAST MEDICAL HISTORY: Past Medical History:  Diagnosis Date   Alcohol abuse    Asthma    Bullous emphysema ([name])    Essential hypertension 04/10/2020   Hemorrhoids    History of radiation therapy 04/08/20-04/19/20   IMRT- Left lung- Dr. Antony Blackbird   Incidental lung nodule, greater than or equal to 8mm 10/22/2017   Left upper lobe - discovered on CTA   Prostate cancer ([name])    Spontaneous pneumothorax 10/20/2017   right   Tobacco abuse     ALLERGIES:  is allergic to morphine and oxycodone.  MEDICATIONS:  Current Outpatient Medications  Medication Sig Dispense Refill   acetaminophen (TYLENOL) 325 MG tablet Take 2 tablets (650 mg total) by mouth every 6 (six) hours as needed for mild pain or headache (fever >/= 101).     albuterol (VENTOLIN HFA) 108 (90 Base) MCG/ACT inhaler Inhale 2 puffs into the lungs every 6 (six) hours as needed for wheezing or shortness of breath. 8 g 1   amLODipine (NORVASC) 10 MG tablet Take 1 tablet by mouth daily.     atorvastatin (LIPITOR)  20 MG tablet Take 20 mg by mouth daily.     cyanocobalamin (VITAMIN B12) 1000 MCG tablet Take 1 tablet (1,000 mcg total) by mouth daily. 90 tablet 1   dexamethasone (DECADRON) 4 MG tablet Take 2 tabs by mouth 2 times daily starting day before chemo. Then take 2 tabs daily for 2 days starting day after chemo. Take with food. 30 tablet 1   ferrous sulfate 325 (65 FE) MG EC tablet Take 1 tablet (325 mg total) by mouth daily with breakfast. 30 tablet 3   folic acid (FOLVITE) 1 MG tablet Take 1 tablet (1 mg total) by mouth daily. 30 tablet 3   hydrOXYzine (ATARAX) 25 MG tablet Take 1 tablet (25 mg total) by mouth at bedtime as needed for itching. 30 tablet 11   ipratropium-albuterol (DUONEB) 0.5-2.5 (3) MG/3ML SOLN Take 3 mLs by nebulization every 6 (six) hours as needed. (Patient taking differently: Take 3 mLs by nebulization every 6 (six) hours as needed (shortness of breathing or wheezing).) 360 mL 0   lidocaine (LIDODERM) 5 % Place 1 patch onto the skin daily. Remove & Discard patch within 12 hours or as directed by MD. Apply to low back 30 patch 0   lidocaine-prilocaine (EMLA) cream Apply 1 Application topically as needed. 30 g 0   losartan (COZAAR) 100 MG tablet Take 100 mg by mouth daily.     olmesartan (BENICAR) 40 MG tablet Take 40 mg by mouth daily.  ondansetron (ZOFRAN) 8 MG tablet Take 1 tablet (8 mg total) by mouth every 8 (eight) hours as needed for nausea or vomiting. 30 tablet 1   polyethylene glycol (MIRALAX) 17 g packet Take 17 g by mouth daily. 30 each 2   potassium chloride SA (KLOR-CON M) 20 MEQ tablet TAKE 1 TABLET(20 MEQ) BY MOUTH DAILY 90 tablet 0   prochlorperazine (COMPAZINE) 10 MG tablet Take 1 tablet (10 mg total) by mouth every 6 (six) hours as needed for nausea or vomiting. 30 tablet 1   senna-docusate (STIMULANT LAXATIVE) 8.6-50 MG tablet Take 2 tablets by mouth 2 (two) times daily. May add additional tablet as needed 150 tablet 3   sodium chloride 1 g tablet Take 1  tablet (1 g total) by mouth 3 (three) times daily with meals. 90 tablet 3   thiamine 100 MG tablet Take 1 tablet (100 mg total) by mouth daily.     traMADol (ULTRAM) 50 MG tablet Take 1-2 tablets (50-100 mg total) by mouth every 6 (six) hours as needed. 120 tablet 0   No current facility-administered medications for this visit.    VITAL SIGNS: There were no vitals taken for this visit. There were no vitals filed for this visit.   Estimated body mass index is 23.27 kg/m as calculated from the following:   Height as of 01/05/23: 5\' 9"  (1.753 m).   Weight as of 01/05/23: 157 lb 9.6 oz (71.5 kg).   PERFORMANCE STATUS (ECOG) : 1 - Symptomatic but completely ambulatory  Assessment NAD RRR Normal breathing pattern AAO x4  Discussed the use of AI scribe software for clinical note transcription with the patient, who gave verbal consent to proceed.   IMPRESSION:  Terry Page presents to clinic for follow-up. No acute distress noted. He is feeling somewhat better. Continues to endorse some fatigue. Denies nausea, vomiting, constipation, or diarrhea. Continues taking things one day at a time. Ongoing fatigue and dyspnea with exertion. Specifically long walks. Continues to take things one day at a time.   Requesting resources to assist with his heating bill at home as he is unable to afford due to increase in bill. Patient provided with information for heating and energy assistance through DSS ([employer]IE[employer]) program which I confirmed did have funds available for his age group. Patient is aware and plans to contact them to pursue application. Also discussed with Nadiyah, LCSWA.   The patient reports his pain is controlled on current regimen. Tolerating Tramadol without difficulty. Denies constipation. No changes needed to regimen.   We will continue to closely monitor. He is taking things one day at a time. Patient knows to contact office as needed.  PLAN:  Cancer Related Pain Patient reports pain is well  controlled. No adjustments to current regimen.  -Continue with Tramadol 50-100 mg as needed every 6 hours.   Resources provided for assistance with home energy and heating bill.   Follow-up Next treatment scheduled for next week in addition to Oncology follow-up.  -Patient knows to contact office as needed.  -I will plan to see patient back in the clinic in 3-4 weeks.   Patient expressed understanding and was in agreement with this plan. He also understands that He can call the clinic at any time with any questions, concerns, or complaints.   Any controlled substances utilized were prescribed in the context of palliative care. PDMP has been reviewed.    Visit consisted of counseling and education dealing with the complex and emotionally intense issues  of symptom management and palliative care in the setting of serious and potentially life-threatening illness.  [name], AGPCNP-BC  Palliative Medicine Team/[facility]

## 2023-01-25 ENCOUNTER — Ambulatory Visit: Payer: Medicare HMO | Admitting: Podiatry

## 2023-01-25 ENCOUNTER — Encounter: Payer: Self-pay | Admitting: Podiatry

## 2023-01-25 DIAGNOSIS — M79674 Pain in right toe(s): Secondary | ICD-10-CM

## 2023-01-25 DIAGNOSIS — R0989 Other specified symptoms and signs involving the circulatory and respiratory systems: Secondary | ICD-10-CM | POA: Diagnosis not present

## 2023-01-25 DIAGNOSIS — B351 Tinea unguium: Secondary | ICD-10-CM | POA: Diagnosis not present

## 2023-01-25 DIAGNOSIS — M79675 Pain in left toe(s): Secondary | ICD-10-CM | POA: Diagnosis not present

## 2023-01-25 NOTE — Progress Notes (Signed)
This patient returns to my office for at risk foot care.  This patient requires this care by a professional since this patient will be at risk due to having kidney disease and his is taking chemo.  This patient is unable to cut nails himself since the patient cannot reach his nails.These nails are painful walking and wearing shoes.  This patient presents for at risk foot care today.  General Appearance  Alert, conversant and in no acute stress.  Vascular  Dorsalis pedis and posterior tibial  pulses are palpable  left foot.  Absent pulses right foot.  Capillary return is within normal limits  bilaterally. Temperature is within normal limits  bilaterally.  Neurologic  Senn-Weinstein monofilament wire test within normal limits  bilaterally. Muscle power within normal limits bilaterally.  Nails Thick disfigured discolored nails with subungual debris  from hallux to fifth toes bilaterally. No evidence of bacterial infection or drainage bilaterally.  Orthopedic  No limitations of motion  feet .  No crepitus or effusions noted.  HAV  B/L.  Skin  normotropic skin with no porokeratosis noted bilaterally.  No signs of infections or ulcers noted.     Onychomycosis  Pain in right toes  Pain in left toes   Mechanical debridement of nails 1-5  bilaterally performed with a nail nipper.  Filed with dremel without incident.    Return office visit   3 months for nail care.                   Told patient to return for periodic foot care and evaluation due to potential at risk complications.   Helane Gunther DPM

## 2023-01-25 NOTE — Progress Notes (Signed)
[facility] [phone]   Fax:(336) [phone]  PROGRESS NOTE  Patient Care Team: Aliene Beams, MD as PCP - General (Family Medicine)  Hematological/Oncological History # Metastatic Adenocarcinoma of the Lung  01/07/2020 : CT of the chest with contrast performed which showed interval enlargement of a spiculated nodule in the central left upper lobe measuring 1.2 x 1.0 cm and highly suspicious for primary lung malignancy 02/06/2020 :PET scan performed which showed a hypermetabolic irregular solid 1.3 cm left upper lobe pulmonary nodule compatible with malignancy without any other hypermetabolic lesions 09/07/6211- 04/19/2020:  SBRT to the lung lesion 04/28/2021: CT C/A/P showed multifocal lytic bone metastases are identified. Lesion within the spine of the left scapula and lesions involving bilateral iliac bones and left sacral wing. Establish care with Dr. Leonides Schanz  05/03/2021: biopsy of right iliac lytic lesion showed metastatic carcinoma, consistent with lung primary.  07/08/2021: Cycle 1 Day 1 of Carbo/Pem/Pem 07/29/2021: Cycle 2 Day 1 of Carbo/Pem/Pem 08/11/2021-08/24/2021: Received palliative radiation to osseous metastasis in the left shoulder. 30 Gy in 10 fractions.  08/19/2021: Cycle 3 Day 1 of Carbo/Pem/Pem 09/09/2021: Cycle 4 Day 1 of Carbo/Pem/Pem 09/23/2021: CT CAP: stable disease 09/30/2021: Cycle 5 Day 1 of Pem/Pem 10/21/2021: Cycle 6 Day 1 of Pembrolizumab. Held pemetrexed due to anemia. 11/16/2021: Cycle 7 Day 1 of Pem/Pem.   12/16/2021: Cycle 8 Day 1 of Pem/Pem.   01/06/2022: Cycle 9 Day 1 of Pem/Pem.  01/27/2022: Cycle 10 Day 1 of Pem/Pem 02/17/2022: Cycle 11 Day 1 of Pem/Pem 03/10/2022: Cycle 12 Day 1 of Pem/Pem (HELD due to anemia with Hgb of 6.4) 03/17/2022: Cycle 12 Day 1 of Pem/Pem (HELD pemextrexed due to cytopenias/kidney function) 04/10/2022: Cycle 13 Day 1 of Pem/Pem (HELD pemextrexed due to cytopenias/kidney function) 04/28/2022: Cycle 14 Day 1 of Pem/Pem  (HELD pemextrexed due to cytopenias/kidney function) 05/23/2022:  Cycle 15 Day 1 of Pem/Pem (HELD pemextrexed due to cytopenias/kidney function) 06/15/2022: Cycle 16 Day 1 of Pembrolizumab.  07/07/2022: Cycle 17 Day 1 of Pembrolizumab.  07/28/2022: Cycle 18 Day 1 of Pembrolizumab 08/18/2022: Cycle 19 Day 1 of Pembrolizumab HELD due to hyponatremia of 118, new bone metastases. 08/25/2022: Cycle 19 Day 1 of Pembrolizumab 08/28/2022-09/13/2022: Received palliative radiation to right pelvis with total dose of 30 Gy in 10 Fx.  09/22/2022:  Cycle 20 Day 1 of Pembrolizumab 10/13/2022:  Cycle 21 Day 1 of Pembrolizumab 12/11/2022: Cycle 1 Day 1 of Docetaxel/Ramucircumab.  01/05/2023: Cycle 2 Day 1 of Docetaxel/Ramucircumab.  01/26/2023: Cycle 3 Day 1 of Docetaxel/Ramucircumab  #Adenocarcinoma of the Prostate, T1cN0M0 08/19/2019-10/07/2019: 70 Gy in 28 fractions of 2.5 Gy.  Radiation to the prostate was under the care of Dr. Margaretmary Dys  Interval History:  Terry Page. 68 y.o. male with medical history significant for metastatic adenocarcinoma of the lung who presents for a follow up visit. The patient's last visit was on 01/05/2023. In the interim since the last visit he completed Cycle 2 of Docetaxel/Ramucircumab.   On exam today, Terry Page reports he is tolerating treatment without significant medications.  He has noticed some nail discoloration but denies any pain or discomfort.  His energy levels and appetite are stable.  He continues to complete his daily activities on his own.  He ambulates using a cane for assistance.  He reports his pain is well-controlled with tramadol that he takes as needed.  His weight is stable at 155 pounds.  He denies nausea, vomiting or bowel habit changes.  He denies fevers, chills,  sweats, shortness of breath, chest pain, cough, headaches or dizziness.He has no other complaints. A full 10 point ROS is listed below.  MEDICAL HISTORY:  Past Medical History:  Diagnosis Date    Alcohol abuse    Asthma    Bullous emphysema ([name])    Essential hypertension 04/10/2020   Hemorrhoids    History of radiation therapy 04/08/20-04/19/20   IMRT- Left lung- Dr. Antony Blackbird   Incidental lung nodule, greater than or equal to 8mm 10/22/2017   Left upper lobe - discovered on CTA   Prostate cancer ([name])    Spontaneous pneumothorax 10/20/2017   right   Tobacco abuse     SURGICAL HISTORY: Past Surgical History:  Procedure Laterality Date   BACK SURGERY     IR IMAGING GUIDED PORT INSERTION  06/29/2021   PROSTATE BIOPSY      SOCIAL HISTORY: Social History   Socioeconomic History   Marital status: Single    Spouse name: Not on file   Number of children: Not on file   Years of education: Not on file   Highest education level: Not on file  Occupational History   Occupation: retired  Tobacco Use   Smoking status: Former    Current packs/day: 0.00    Types: Cigarettes    Quit date: 11/22/2019    Years since quitting: 3.1   Smokeless tobacco: Never   Tobacco comments:    Patient reports quit 3 years ago. 06/25/20. [facility]SM  Vaping Use   Vaping status: Never Used  Substance and Sexual Activity   Alcohol use: Yes    Alcohol/week: 12.0 standard drinks of alcohol    Types: 12 Cans of beer per week    Comment: daily sometimes   Drug use: No   Sexual activity: Not Currently  Other Topics Concern   Not on file  Social History Narrative   Not on file   Social Drivers of Health   Financial Resource Strain: Not on file  Food Insecurity: Not on file  Transportation Needs: Unmet Transportation Needs (08/18/2022)   PRAPARE - Administrator, Civil Service (Medical): Yes    Lack of Transportation (Non-Medical): No  Physical Activity: Not on file  Stress: Not on file  Social Connections: Not on file  Intimate Partner Violence: Not on file    FAMILY HISTORY: Family History  Problem Relation Age of Onset   Cancer Cousin        maternal cousin   Cancer  Cousin        paternal cousin   Cancer Cousin    Colon polyps Neg Hx    Pancreatic disease Neg Hx    Pancreatic cancer Neg Hx    Breast cancer Neg Hx    Colon cancer Neg Hx     ALLERGIES:  is allergic to morphine and oxycodone.  MEDICATIONS:  Current Outpatient Medications  Medication Sig Dispense Refill   acetaminophen (TYLENOL) 325 MG tablet Take 2 tablets (650 mg total) by mouth every 6 (six) hours as needed for mild pain or headache (fever >/= 101).     amLODipine (NORVASC) 10 MG tablet Take 1 tablet by mouth daily.     atorvastatin (LIPITOR) 20 MG tablet Take 20 mg by mouth daily.     cyanocobalamin (VITAMIN B12) 1000 MCG tablet Take 1 tablet (1,000 mcg total) by mouth daily. 90 tablet 1   dexamethasone (DECADRON) 4 MG tablet Take 2 tabs by mouth 2 times daily starting day before chemo. Then  take 2 tabs daily for 2 days starting day after chemo. Take with food. 30 tablet 1   ferrous sulfate 325 (65 FE) MG EC tablet Take 1 tablet (325 mg total) by mouth daily with breakfast. 30 tablet 3   folic acid (FOLVITE) 1 MG tablet Take 1 tablet (1 mg total) by mouth daily. 30 tablet 3   hydrOXYzine (ATARAX) 25 MG tablet Take 1 tablet (25 mg total) by mouth at bedtime as needed for itching. 30 tablet 11   ipratropium-albuterol (DUONEB) 0.5-2.5 (3) MG/3ML SOLN Take 3 mLs by nebulization every 6 (six) hours as needed. (Patient taking differently: Take 3 mLs by nebulization every 6 (six) hours as needed (shortness of breathing or wheezing).) 360 mL 0   lidocaine (LIDODERM) 5 % Place 1 patch onto the skin daily. Remove & Discard patch within 12 hours or as directed by MD. Apply to low back 30 patch 0   lidocaine-prilocaine (EMLA) cream Apply 1 Application topically as needed. 30 g 0   losartan (COZAAR) 100 MG tablet Take 100 mg by mouth daily.     olmesartan (BENICAR) 40 MG tablet Take 40 mg by mouth daily.     ondansetron (ZOFRAN) 8 MG tablet Take 1 tablet (8 mg total) by mouth every 8 (eight)  hours as needed for nausea or vomiting. 30 tablet 1   polyethylene glycol (MIRALAX) 17 g packet Take 17 g by mouth daily. 30 each 2   potassium chloride SA (KLOR-CON M) 20 MEQ tablet TAKE 1 TABLET(20 MEQ) BY MOUTH DAILY 90 tablet 0   prochlorperazine (COMPAZINE) 10 MG tablet Take 1 tablet (10 mg total) by mouth every 6 (six) hours as needed for nausea or vomiting. 30 tablet 1   senna-docusate (STIMULANT LAXATIVE) 8.6-50 MG tablet Take 2 tablets by mouth 2 (two) times daily. May add additional tablet as needed 150 tablet 3   sodium chloride 1 g tablet Take 1 tablet (1 g total) by mouth 3 (three) times daily with meals. 90 tablet 3   thiamine 100 MG tablet Take 1 tablet (100 mg total) by mouth daily.     traMADol (ULTRAM) 50 MG tablet Take 1-2 tablets (50-100 mg total) by mouth every 6 (six) hours as needed. 120 tablet 0   albuterol (VENTOLIN HFA) 108 (90 Base) MCG/ACT inhaler Inhale 2 puffs into the lungs every 6 (six) hours as needed for wheezing or shortness of breath. 8 g 1   No current facility-administered medications for this visit.    REVIEW OF SYSTEMS:   Constitutional: ( - ) fevers, ( - )  chills , ( - ) night sweats Eyes: ( - ) blurriness of vision, ( - ) double vision, ( - ) watery eyes Ears, nose, mouth, throat, and face: ( - ) mucositis, ( - ) sore throat Respiratory: ( - ) cough, ( - ) dyspnea, ( - ) wheezes Cardiovascular: ( - ) palpitation, ( - ) chest discomfort, ( - ) lower extremity swelling Gastrointestinal:  ( - ) nausea, ( - ) heartburn, ( - ) change in bowel habits Skin: ( - ) abnormal skin rashes Lymphatics: ( - ) new lymphadenopathy, ( - ) easy bruising Neurological: ( - ) numbness, ( - ) tingling, ( - ) new weaknesses Behavioral/Psych: ( - ) mood change, ( - ) new changes  All other systems were reviewed with the patient and are negative.  PHYSICAL EXAMINATION: ECOG PERFORMANCE STATUS: 1 - Symptomatic but completely ambulatory  Vitals:  01/26/23 0954  BP:  138/84  Pulse: 60  Resp: 16  Temp: (!) 97.3 F (36.3 C)  SpO2: 99%    Filed Weights   01/26/23 0954  Weight: 155 lb 3.2 oz (70.4 kg)     GENERAL: Well-appearing elderly African-American male, alert, no distress and comfortable SKIN: skin color, texture, turgor are normal, no rashes or significant lesions EYES: conjunctiva are pink and non-injected, sclera clear LUNGS: clear to auscultation and percussion with normal breathing effort HEART: regular rate & rhythm and no murmurs and no lower extremity edema Musculoskeletal: no cyanosis of digits and no clubbing  PSYCH: alert & oriented x 3, fluent speech NEURO: no focal motor/sensory deficits  LABORATORY DATA:  I have reviewed the data as listed    Latest Ref Rng & Units 01/26/2023    9:39 AM 01/12/2023   11:36 AM 01/05/2023   11:40 AM  CBC  WBC 4.0 - 10.5 K/uL 26.7  6.6  27.7   Hemoglobin 13.0 - 17.0 g/dL 16.1  9.5  09.6   Hematocrit 39.0 - 52.0 % 31.2  28.2  32.4   Platelets 150 - 400 K/uL 519  160  420        Latest Ref Rng & Units 01/26/2023    9:39 AM 01/12/2023   11:36 AM 01/05/2023   11:40 AM  CMP  Glucose 70 - 99 mg/dL 045  97  409   BUN 8 - 23 mg/dL 9  10  19    Creatinine 0.61 - 1.24 mg/dL 8.11  9.14  7.82   Sodium 135 - 145 mmol/L 132  132  133   Potassium 3.5 - 5.1 mmol/L 4.3  4.4  4.0   Chloride 98 - 111 mmol/L 98  98  102   CO2 22 - 32 mmol/L 26  29  24    Calcium 8.9 - 10.3 mg/dL 9.4  9.4  9.1   Total Protein 6.5 - 8.1 g/dL 7.1  6.6  7.1   Total Bilirubin 0.0 - 1.2 mg/dL 0.2  0.4  0.2   Alkaline Phos 38 - 126 U/L 82  91  79   AST 15 - 41 U/L 13  11  13    ALT 0 - 44 U/L 9  13  20      Lab Results  Component Value Date   MPROTEIN Not Observed 04/28/2021   Lab Results  Component Value Date   KPAFRELGTCHN 33.6 (H) 04/28/2021   LAMBDASER 22.0 04/28/2021   KAPLAMBRATIO 1.53 04/28/2021    RADIOGRAPHIC STUDIES: No results found.  ASSESSMENT & PLAN Terry Page. Is a 68 y.o.  male with medical  history significant for metastatic adenocarcinoma of the lung who presents for a follow up visit.  # Metastatic Adenocarcinoma of the Lung  -- NGS testing from this patient shows no evidence of targetable mutation. --MRI brain shows no evidence of intracranial metastases --Started carboplatin, pembrolizumab, and pemetrexed as treatment for his cancer on 07/08/2021. --Received palliative radiation to osseous metastasis of the left shoulder from 08/11/2021-08/24/2021. Planned dose is 30 Gy in 10 fx.  --Discontinued Pemetrexed on 03/17/2022 due to persistent anemia and kidney dysfunction. --Restaging CT CAP from 08/09/2022 shows interval enlargement of multiple mixed lytic and sclerotic bone metastases involving the right pelvis. Other bone metastases including of the left scapula, sacrum, and left hemipelvis are unchanged. Unchanged treated central LUL mass. Recommend to continue on pembrolizumab therapy and arrange for palliative radiation to enlarging bone metastases.  Plan: --today is  Cycle 3 Day 1 of Docetaxel/Ramucirumab.  --labs from today were reviewed. Labs show white blood cell count 26.7 (secondary to growth factor), hemoglobin 10.5, MCV 87.4, platelets 519 Creatinine and LFTs normal.  --Plan for repeat CT imaging around Feb 2025. Last scan in Nov 2024 showed progression of disease.  --RTC in 3 week with labs and follow up visit before Cycle 4. We will obtain repeat CT imaging before then.   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  --Pain is slightly improved but still present.  --Continue to take tramadol q 6 hours for pain control.   #Hyponatremia-improved --Chronic in nature --Sodium level dropped from 127 on 08/09/2022 to 118.  Today sodium stable to 132.  --Etiologies including SIADH, immunotherapy induced,  too much water intake.  --Patient is asymptomatic without nausea/vomiting, headaches, confusion, seizures, etc --Currently on salt tablets 2 gm PO daily. We advised to titrate to twice daily x 7 days and then three times daily.  --Strict ED precautions given for any new symptoms as discussed above.   # Iron Deficiency Anemia #Chemotherapy induced anemia --Hgb level 10.5, stable today.  --Currently on ferrous sulfate 325 mg PO daily  #Pain Control --stopped oxycodone due to itching --currently following with Palliative care.  --pain well controlled with tramadol as needed. Continue to take tramdol 50-100 mg q 6 hours as needed. --Continue senna docusate and miralax for constipation prophylaxis --Continue to monitor  #Supportive Care -- chemotherapy education complete -- port placed -- zofran 8mg  q8H PRN and compazine 10mg  PO q6H for nausea -- EMLA cream for port -- Patient referred to palliative care for pain control. Pain control as above.    Orders Placed This Encounter  Procedures   CT CHEST ABDOMEN PELVIS W CONTRAST    Standing Status:   Future    Expected Date:   02/12/2023    Expiration Date:   01/26/2024    If indicated for the ordered procedure, I authorize the administration of contrast media per Radiology protocol:   Yes    Does the patient have a contrast media/X-ray dye allergy?:   No    Preferred imaging location?:   [facility]    If indicated for the ordered procedure, I authorize the administration of oral contrast media per Radiology protocol:   Yes    All questions were answered. The patient knows to call the clinic with any problems, questions or concerns.  I have spent a total of 30 minutes minutes of face-to-face and non-face-to-face time, preparing to see the patient, performing a medically appropriate examination, counseling and educating the patient, referring and communicating with other health care professionals, documenting clinical information  in the electronic health record, and care coordination.   Georga Kaufmann PA-C Dept of Hematology and Oncology [facility] at [facility] Phone: [phone]   01/26/2023 10:42 AM

## 2023-01-26 ENCOUNTER — Inpatient Hospital Stay: Payer: Medicare HMO

## 2023-01-26 ENCOUNTER — Inpatient Hospital Stay (HOSPITAL_BASED_OUTPATIENT_CLINIC_OR_DEPARTMENT_OTHER): Payer: Medicare HMO | Admitting: Nurse Practitioner

## 2023-01-26 ENCOUNTER — Encounter: Payer: Self-pay | Admitting: Nurse Practitioner

## 2023-01-26 ENCOUNTER — Inpatient Hospital Stay: Payer: Medicare HMO | Admitting: Physician Assistant

## 2023-01-26 VITALS — BP 143/94 | HR 77 | Resp 16

## 2023-01-26 VITALS — BP 138/84 | HR 60 | Temp 97.3°F | Resp 16 | Wt 155.2 lb

## 2023-01-26 DIAGNOSIS — C7951 Secondary malignant neoplasm of bone: Secondary | ICD-10-CM | POA: Diagnosis not present

## 2023-01-26 DIAGNOSIS — R530 Neoplastic (malignant) related fatigue: Secondary | ICD-10-CM

## 2023-01-26 DIAGNOSIS — Z5112 Encounter for antineoplastic immunotherapy: Secondary | ICD-10-CM | POA: Diagnosis not present

## 2023-01-26 DIAGNOSIS — C3412 Malignant neoplasm of upper lobe, left bronchus or lung: Secondary | ICD-10-CM

## 2023-01-26 DIAGNOSIS — G893 Neoplasm related pain (acute) (chronic): Secondary | ICD-10-CM

## 2023-01-26 DIAGNOSIS — Z515 Encounter for palliative care: Secondary | ICD-10-CM

## 2023-01-26 DIAGNOSIS — C3490 Malignant neoplasm of unspecified part of unspecified bronchus or lung: Secondary | ICD-10-CM | POA: Diagnosis not present

## 2023-01-26 DIAGNOSIS — Z95828 Presence of other vascular implants and grafts: Secondary | ICD-10-CM

## 2023-01-26 DIAGNOSIS — D649 Anemia, unspecified: Secondary | ICD-10-CM

## 2023-01-26 LAB — CMP (CANCER CENTER ONLY)
ALT: 9 U/L (ref 0–44)
AST: 13 U/L — ABNORMAL LOW (ref 15–41)
Albumin: 3.3 g/dL — ABNORMAL LOW (ref 3.5–5.0)
Alkaline Phosphatase: 82 U/L (ref 38–126)
Anion gap: 8 (ref 5–15)
BUN: 9 mg/dL (ref 8–23)
CO2: 26 mmol/L (ref 22–32)
Calcium: 9.4 mg/dL (ref 8.9–10.3)
Chloride: 98 mmol/L (ref 98–111)
Creatinine: 0.86 mg/dL (ref 0.61–1.24)
GFR, Estimated: >60 mL/min (ref >60)
Glucose, Bld: 140 mg/dL — ABNORMAL HIGH (ref 70–99)
Potassium: 4.3 mmol/L (ref 3.5–5.1)
Sodium: 132 mmol/L — ABNORMAL LOW (ref 135–145)
Total Bilirubin: 0.2 mg/dL (ref 0.0–1.2)
Total Protein: 7.1 g/dL (ref 6.5–8.1)

## 2023-01-26 LAB — TOTAL PROTEIN, URINE DIPSTICK: Protein, ur: NEGATIVE mg/dL

## 2023-01-26 LAB — CBC WITH DIFFERENTIAL (CANCER CENTER ONLY)
Abs Immature Granulocytes: 0.35 10*3/uL — ABNORMAL HIGH (ref 0.00–0.07)
Basophils Absolute: 0.1 10*3/uL (ref 0.0–0.1)
Basophils Relative: 0 %
Eosinophils Absolute: 0.1 10*3/uL (ref 0.0–0.5)
Eosinophils Relative: 0 %
HCT: 31.2 % — ABNORMAL LOW (ref 39.0–52.0)
Hemoglobin: 10.5 g/dL — ABNORMAL LOW (ref 13.0–17.0)
Immature Granulocytes: 1 %
Lymphocytes Relative: 3 %
Lymphs Abs: 0.8 10*3/uL (ref 0.7–4.0)
MCH: 29.4 pg (ref 26.0–34.0)
MCHC: 33.7 g/dL (ref 30.0–36.0)
MCV: 87.4 fL (ref 80.0–100.0)
Monocytes Absolute: 0.4 10*3/uL (ref 0.1–1.0)
Monocytes Relative: 2 %
Neutro Abs: 25.1 10*3/uL — ABNORMAL HIGH (ref 1.7–7.7)
Neutrophils Relative %: 94 %
Platelet Count: 519 10*3/uL — ABNORMAL HIGH (ref 150–400)
RBC: 3.57 MIL/uL — ABNORMAL LOW (ref 4.22–5.81)
RDW: 15.6 % — ABNORMAL HIGH (ref 11.5–15.5)
WBC Count: 26.7 10*3/uL — ABNORMAL HIGH (ref 4.0–10.5)
nRBC: 0 % (ref 0.0–0.2)

## 2023-01-26 LAB — TSH: TSH: 0.569 u[IU]/mL (ref 0.350–4.500)

## 2023-01-26 LAB — SAMPLE TO BLOOD BANK

## 2023-01-26 MED ORDER — SODIUM CHLORIDE 0.9 % IV SOLN: INTRAVENOUS | Status: DC

## 2023-01-26 MED ORDER — DEXAMETHASONE SODIUM PHOSPHATE 10 MG/ML IJ SOLN
10.0000 mg | Freq: Once | INTRAMUSCULAR | Status: AC
  Administered 2023-01-26: 10 mg via INTRAVENOUS
  Filled 2023-01-26: qty 1

## 2023-01-26 MED ORDER — ACETAMINOPHEN 325 MG PO TABS
650.0000 mg | ORAL_TABLET | Freq: Once | ORAL | Status: AC
Start: 2023-01-26 — End: 2023-01-26
  Administered 2023-01-26: 650 mg via ORAL
  Filled 2023-01-26: qty 2

## 2023-01-26 MED ORDER — SODIUM CHLORIDE 0.9 % IV SOLN
75.0000 mg/m2 | Freq: Once | INTRAVENOUS | Status: AC
  Administered 2023-01-26: 136 mg via INTRAVENOUS
  Filled 2023-01-26: qty 13.6

## 2023-01-26 MED ORDER — SODIUM CHLORIDE 0.9% FLUSH
10.0000 mL | Freq: Once | INTRAVENOUS | Status: AC
  Administered 2023-01-26: 10 mL

## 2023-01-26 MED ORDER — SODIUM CHLORIDE 0.9 % IV SOLN
10.0000 mg/kg | Freq: Once | INTRAVENOUS | Status: AC
  Administered 2023-01-26: 700 mg via INTRAVENOUS
  Filled 2023-01-26: qty 20

## 2023-01-26 MED ORDER — SODIUM CHLORIDE 0.9% FLUSH
10.0000 mL | INTRAVENOUS | Status: DC | PRN
  Administered 2023-01-26: 10 mL

## 2023-01-26 MED ORDER — HEPARIN SOD (PORK) LOCK FLUSH 100 UNIT/ML IV SOLN
500.0000 [IU] | Freq: Once | INTRAVENOUS | Status: AC | PRN
  Administered 2023-01-26: 500 [IU]

## 2023-01-26 MED ORDER — DIPHENHYDRAMINE HCL 50 MG/ML IJ SOLN
50.0000 mg | Freq: Once | INTRAMUSCULAR | Status: AC
  Administered 2023-01-26: 50 mg via INTRAVENOUS
  Filled 2023-01-26: qty 1

## 2023-01-26 NOTE — Patient Instructions (Signed)
[facility] - A DEPT OF [facility]  Discharge Instructions: Thank you for choosing [facility] to provide your oncology and hematology care.   If you have a lab appointment with the Cancer Center, please go directly to the Cancer Center and check in at the registration area.   Wear comfortable clothing and clothing appropriate for easy access to any Portacath or PICC line.   We strive to give you quality time with your provider. You may need to reschedule your appointment if you arrive late (15 or more minutes).  Arriving late affects you and other patients whose appointments are after yours.  Also, if you miss three or more appointments without notifying the office, you may be dismissed from the clinic at the provider's discretion.      For prescription refill requests, have your pharmacy contact our office and allow 72 hours for refills to be completed.    Today you received the following chemotherapy and/or immunotherapy agents: Ramucirumab (Cyramza) and Docetaxel (Taxotere)      To help prevent nausea and vomiting after your treatment, we encourage you to take your nausea medication as directed.  BELOW ARE SYMPTOMS THAT SHOULD BE REPORTED IMMEDIATELY: *FEVER GREATER THAN 100.4 F (38 C) OR HIGHER *CHILLS OR SWEATING *NAUSEA AND VOMITING THAT IS NOT CONTROLLED WITH YOUR NAUSEA MEDICATION *UNUSUAL SHORTNESS OF BREATH *UNUSUAL BRUISING OR BLEEDING *URINARY PROBLEMS (pain or burning when urinating, or frequent urination) *BOWEL PROBLEMS (unusual diarrhea, constipation, pain near the anus) TENDERNESS IN MOUTH AND THROAT WITH OR WITHOUT PRESENCE OF ULCERS (sore throat, sores in mouth, or a toothache) UNUSUAL RASH, SWELLING OR PAIN  UNUSUAL VAGINAL DISCHARGE OR ITCHING   Items with * indicate a potential emergency and should be followed up as soon as possible or go to the Emergency Department if any problems should occur.  Please show the  CHEMOTHERAPY ALERT CARD or IMMUNOTHERAPY ALERT CARD at check-in to the Emergency Department and triage nurse.  Should you have questions after your visit or need to cancel or reschedule your appointment, please contact [facility] - [facility]  Dept: [phone]  and follow the prompts.  Office hours are 8:00 a.m. to 4:30 p.m. Monday - Friday. Please note that voicemails left after 4:00 p.m. may not be returned until the following business day.  We are closed weekends and major holidays. You have access to a nurse at all times for urgent questions. Please call the main number to the clinic Dept: [phone] and follow the prompts.   For any non-urgent questions, you may also contact your provider using MyChart. We now offer e-Visits for anyone 54 and older to request care online for non-urgent symptoms. For details visit [email or web address].   Also download the MyChart app! Go to the app store, search "MyChart", open the app, select , and log in with your MyChart username and password.

## 2023-01-27 LAB — T4: T4, Total: 6.5 ug/dL (ref 4.5–12.0)

## 2023-01-29 ENCOUNTER — Inpatient Hospital Stay: Payer: Medicare HMO

## 2023-01-29 ENCOUNTER — Encounter: Payer: Self-pay | Admitting: Hematology and Oncology

## 2023-01-29 VITALS — BP 111/77 | HR 103 | Temp 97.9°F | Resp 17

## 2023-01-29 DIAGNOSIS — C3412 Malignant neoplasm of upper lobe, left bronchus or lung: Secondary | ICD-10-CM

## 2023-01-29 DIAGNOSIS — Z5112 Encounter for antineoplastic immunotherapy: Secondary | ICD-10-CM | POA: Diagnosis not present

## 2023-01-29 MED ORDER — PEGFILGRASTIM-JMDB 6 MG/0.6ML ~~LOC~~ SOSY
6.0000 mg | PREFILLED_SYRINGE | Freq: Once | SUBCUTANEOUS | Status: AC
  Administered 2023-01-29: 6 mg via SUBCUTANEOUS
  Filled 2023-01-29: qty 0.6

## 2023-02-02 ENCOUNTER — Encounter: Payer: Self-pay | Admitting: Hematology and Oncology

## 2023-02-06 ENCOUNTER — Other Ambulatory Visit (HOSPITAL_COMMUNITY): Payer: Medicare HMO

## 2023-02-06 ENCOUNTER — Telehealth: Payer: Self-pay | Admitting: *Deleted

## 2023-02-06 NOTE — Telephone Encounter (Signed)
TC from [place], [place] w/[facility], Carilyn Goodpasture, NP. They wanted to send patient's CBC in their office on 02/06/23 with result of WBC 61.1 to Dr. Leonides Schanz. Fax number provided and records received.

## 2023-02-08 ENCOUNTER — Encounter: Payer: Self-pay | Admitting: Family Medicine

## 2023-02-09 ENCOUNTER — Ambulatory Visit: Payer: Medicare HMO | Admitting: Podiatry

## 2023-02-09 DIAGNOSIS — M722 Plantar fascial fibromatosis: Secondary | ICD-10-CM

## 2023-02-09 MED ORDER — TRIAMCINOLONE ACETONIDE 10 MG/ML IJ SUSP
10.0000 mg | Freq: Once | INTRAMUSCULAR | Status: AC
  Administered 2023-02-09: 10 mg via INTRA_ARTICULAR

## 2023-02-12 ENCOUNTER — Telehealth: Payer: Self-pay | Admitting: Hematology and Oncology

## 2023-02-12 ENCOUNTER — Encounter: Payer: Self-pay | Admitting: Hematology and Oncology

## 2023-02-12 ENCOUNTER — Other Ambulatory Visit: Payer: Self-pay | Admitting: Nurse Practitioner

## 2023-02-12 ENCOUNTER — Other Ambulatory Visit: Payer: Self-pay

## 2023-02-12 DIAGNOSIS — G893 Neoplasm related pain (acute) (chronic): Secondary | ICD-10-CM

## 2023-02-12 DIAGNOSIS — C3490 Malignant neoplasm of unspecified part of unspecified bronchus or lung: Secondary | ICD-10-CM

## 2023-02-12 DIAGNOSIS — Z515 Encounter for palliative care: Secondary | ICD-10-CM

## 2023-02-12 DIAGNOSIS — C7951 Secondary malignant neoplasm of bone: Secondary | ICD-10-CM

## 2023-02-12 MED ORDER — TRAMADOL HCL 50 MG PO TABS: 50.0000 mg | ORAL_TABLET | Freq: Four times a day (QID) | ORAL | 0 refills | Status: DC | PRN

## 2023-02-12 MED ORDER — SODIUM CHLORIDE 1 G PO TABS: 1.0000 g | ORAL_TABLET | Freq: Three times a day (TID) | ORAL | 3 refills | Status: DC

## 2023-02-12 NOTE — Telephone Encounter (Signed)
 Pt called for refill, see associated orders.

## 2023-02-12 NOTE — Progress Notes (Signed)
 Subjective:   Patient ID: Terry Page., male   DOB: 68 y.o.   MRN: [id number]   HPI Patient states getting a lot of pain in the bottom of his heels and they have been inflamed over the last few weeks and hard to walk on with some peeling also noted   ROS      Objective:  Physical Exam  Neuro vascular status intact with inflammation in the plantar heel bilateral fluid buildup and pain with slight peeling but appears to be normal     Assessment:  Most likely plantar fasciitis creating this inflammatory cycle with the peeling minimal in appearance     Plan:  H&P reviewed sterile prep injected the plantar fascia bilateral 3 mg Kenalog  5 mg Xylocaine  applied sterile dressing reappoint to recheck

## 2023-02-13 ENCOUNTER — Encounter: Payer: Self-pay | Admitting: Hematology and Oncology

## 2023-02-15 ENCOUNTER — Ambulatory Visit (HOSPITAL_COMMUNITY)
Admission: RE | Admit: 2023-02-15 | Discharge: 2023-02-15 | Disposition: A | Discharge status: Home / Self Care | Payer: Medicare HMO | Source: Ambulatory Visit | Attending: Physician Assistant | Admitting: Physician Assistant

## 2023-02-15 DIAGNOSIS — C3412 Malignant neoplasm of upper lobe, left bronchus or lung: Secondary | ICD-10-CM | POA: Insufficient documentation

## 2023-02-15 MED ORDER — HEPARIN SOD (PORK) LOCK FLUSH 100 UNIT/ML IV SOLN
INTRAVENOUS | Status: AC
Start: 2023-02-15 — End: ?
  Filled 2023-02-15: qty 5

## 2023-02-15 MED ORDER — IOHEXOL 300 MG/ML  SOLN
100.0000 mL | Freq: Once | INTRAMUSCULAR | Status: AC | PRN
  Administered 2023-02-15: 100 mL via INTRAVENOUS

## 2023-02-15 MED ORDER — HEPARIN SOD (PORK) LOCK FLUSH 100 UNIT/ML IV SOLN
500.0000 [IU] | Freq: Once | INTRAVENOUS | Status: AC
Start: 2023-02-15 — End: 2023-02-15
  Administered 2023-02-15: 500 [IU] via INTRAVENOUS

## 2023-02-16 ENCOUNTER — Inpatient Hospital Stay: Payer: Medicare HMO | Attending: Hematology and Oncology

## 2023-02-16 ENCOUNTER — Inpatient Hospital Stay (HOSPITAL_BASED_OUTPATIENT_CLINIC_OR_DEPARTMENT_OTHER): Payer: Medicare HMO | Admitting: Nurse Practitioner

## 2023-02-16 ENCOUNTER — Encounter: Payer: Self-pay | Admitting: Nurse Practitioner

## 2023-02-16 ENCOUNTER — Telehealth: Payer: Self-pay

## 2023-02-16 ENCOUNTER — Inpatient Hospital Stay: Payer: Medicare HMO

## 2023-02-16 ENCOUNTER — Inpatient Hospital Stay (HOSPITAL_BASED_OUTPATIENT_CLINIC_OR_DEPARTMENT_OTHER): Payer: Medicare HMO | Admitting: Physician Assistant

## 2023-02-16 VITALS — BP 142/93 | HR 111 | Temp 98.5°F | Resp 16 | Wt 154.6 lb

## 2023-02-16 VITALS — BP 164/95 | HR 79 | Resp 16

## 2023-02-16 DIAGNOSIS — I10 Essential (primary) hypertension: Secondary | ICD-10-CM | POA: Diagnosis not present

## 2023-02-16 DIAGNOSIS — C7951 Secondary malignant neoplasm of bone: Secondary | ICD-10-CM

## 2023-02-16 DIAGNOSIS — C3412 Malignant neoplasm of upper lobe, left bronchus or lung: Secondary | ICD-10-CM

## 2023-02-16 DIAGNOSIS — M5442 Lumbago with sciatica, left side: Secondary | ICD-10-CM | POA: Diagnosis not present

## 2023-02-16 DIAGNOSIS — L608 Other nail disorders: Secondary | ICD-10-CM | POA: Diagnosis not present

## 2023-02-16 DIAGNOSIS — Z5111 Encounter for antineoplastic chemotherapy: Secondary | ICD-10-CM | POA: Diagnosis present

## 2023-02-16 DIAGNOSIS — Z95828 Presence of other vascular implants and grafts: Secondary | ICD-10-CM

## 2023-02-16 DIAGNOSIS — G893 Neoplasm related pain (acute) (chronic): Secondary | ICD-10-CM

## 2023-02-16 DIAGNOSIS — D6481 Anemia due to antineoplastic chemotherapy: Secondary | ICD-10-CM | POA: Insufficient documentation

## 2023-02-16 DIAGNOSIS — C61 Malignant neoplasm of prostate: Secondary | ICD-10-CM | POA: Diagnosis not present

## 2023-02-16 DIAGNOSIS — R530 Neoplastic (malignant) related fatigue: Secondary | ICD-10-CM | POA: Diagnosis not present

## 2023-02-16 DIAGNOSIS — Z87891 Personal history of nicotine dependence: Secondary | ICD-10-CM | POA: Diagnosis not present

## 2023-02-16 DIAGNOSIS — E871 Hypo-osmolality and hyponatremia: Secondary | ICD-10-CM | POA: Diagnosis not present

## 2023-02-16 DIAGNOSIS — Z515 Encounter for palliative care: Secondary | ICD-10-CM | POA: Diagnosis not present

## 2023-02-16 DIAGNOSIS — K5900 Constipation, unspecified: Secondary | ICD-10-CM | POA: Insufficient documentation

## 2023-02-16 DIAGNOSIS — R634 Abnormal weight loss: Secondary | ICD-10-CM | POA: Insufficient documentation

## 2023-02-16 DIAGNOSIS — T451X5D Adverse effect of antineoplastic and immunosuppressive drugs, subsequent encounter: Secondary | ICD-10-CM | POA: Insufficient documentation

## 2023-02-16 DIAGNOSIS — G8929 Other chronic pain: Secondary | ICD-10-CM

## 2023-02-16 DIAGNOSIS — Z923 Personal history of irradiation: Secondary | ICD-10-CM | POA: Diagnosis not present

## 2023-02-16 DIAGNOSIS — C3490 Malignant neoplasm of unspecified part of unspecified bronchus or lung: Secondary | ICD-10-CM | POA: Diagnosis not present

## 2023-02-16 DIAGNOSIS — Z5112 Encounter for antineoplastic immunotherapy: Secondary | ICD-10-CM | POA: Insufficient documentation

## 2023-02-16 DIAGNOSIS — Z79891 Long term (current) use of opiate analgesic: Secondary | ICD-10-CM | POA: Diagnosis not present

## 2023-02-16 DIAGNOSIS — K5903 Drug induced constipation: Secondary | ICD-10-CM

## 2023-02-16 DIAGNOSIS — Z79899 Other long term (current) drug therapy: Secondary | ICD-10-CM | POA: Insufficient documentation

## 2023-02-16 LAB — CBC WITH DIFFERENTIAL (CANCER CENTER ONLY)
Abs Immature Granulocytes: 1.78 10*3/uL — ABNORMAL HIGH (ref 0.00–0.07)
Basophils Absolute: 0.2 10*3/uL — ABNORMAL HIGH (ref 0.0–0.1)
Basophils Relative: 0 %
Eosinophils Absolute: 0.1 10*3/uL (ref 0.0–0.5)
Eosinophils Relative: 0 %
HCT: 33.4 % — ABNORMAL LOW (ref 39.0–52.0)
Hemoglobin: 10.9 g/dL — ABNORMAL LOW (ref 13.0–17.0)
Immature Granulocytes: 4 %
Lymphocytes Relative: 2 %
Lymphs Abs: 0.9 10*3/uL (ref 0.7–4.0)
MCH: 29.1 pg (ref 26.0–34.0)
MCHC: 32.6 g/dL (ref 30.0–36.0)
MCV: 89.1 fL (ref 80.0–100.0)
Monocytes Absolute: 0.7 10*3/uL (ref 0.1–1.0)
Monocytes Relative: 2 %
Neutro Abs: 42.9 10*3/uL — ABNORMAL HIGH (ref 1.7–7.7)
Neutrophils Relative %: 92 %
Platelet Count: 410 10*3/uL — ABNORMAL HIGH (ref 150–400)
RBC: 3.75 MIL/uL — ABNORMAL LOW (ref 4.22–5.81)
RDW: 17.2 % — ABNORMAL HIGH (ref 11.5–15.5)
WBC Count: 46.5 10*3/uL — ABNORMAL HIGH (ref 4.0–10.5)
nRBC: 0.1 % (ref 0.0–0.2)

## 2023-02-16 LAB — CMP (CANCER CENTER ONLY)
ALT: 13 U/L (ref 0–44)
AST: 13 U/L — ABNORMAL LOW (ref 15–41)
Albumin: 3.7 g/dL (ref 3.5–5.0)
Alkaline Phosphatase: 90 U/L (ref 38–126)
Anion gap: 7 (ref 5–15)
BUN: 18 mg/dL (ref 8–23)
CO2: 24 mmol/L (ref 22–32)
Calcium: 9.2 mg/dL (ref 8.9–10.3)
Chloride: 99 mmol/L (ref 98–111)
Creatinine: 0.79 mg/dL (ref 0.61–1.24)
GFR, Estimated: >60 mL/min (ref >60)
Glucose, Bld: 139 mg/dL — ABNORMAL HIGH (ref 70–99)
Potassium: 4.4 mmol/L (ref 3.5–5.1)
Sodium: 130 mmol/L — ABNORMAL LOW (ref 135–145)
Total Bilirubin: 0.3 mg/dL (ref 0.0–1.2)
Total Protein: 7 g/dL (ref 6.5–8.1)

## 2023-02-16 MED ORDER — SODIUM CHLORIDE 0.9 % IV SOLN
10.0000 mg/kg | Freq: Once | INTRAVENOUS | Status: AC
  Administered 2023-02-16: 700 mg via INTRAVENOUS
  Filled 2023-02-16: qty 50

## 2023-02-16 MED ORDER — DEXAMETHASONE SODIUM PHOSPHATE 10 MG/ML IJ SOLN
10.0000 mg | Freq: Once | INTRAMUSCULAR | Status: AC
  Administered 2023-02-16: 10 mg via INTRAVENOUS
  Filled 2023-02-16: qty 1

## 2023-02-16 MED ORDER — DRONABINOL 5 MG PO CAPS: 5.0000 mg | ORAL_CAPSULE | Freq: Two times a day (BID) | ORAL | 0 refills | Status: DC

## 2023-02-16 MED ORDER — SODIUM CHLORIDE 0.9% FLUSH
10.0000 mL | Freq: Once | INTRAVENOUS | Status: AC
Start: 2023-02-16 — End: 2023-02-16
  Administered 2023-02-16: 10 mL

## 2023-02-16 MED ORDER — ACETAMINOPHEN 325 MG PO TABS
650.0000 mg | ORAL_TABLET | Freq: Once | ORAL | Status: AC
  Administered 2023-02-16: 650 mg via ORAL
  Filled 2023-02-16: qty 2

## 2023-02-16 MED ORDER — HEPARIN SOD (PORK) LOCK FLUSH 100 UNIT/ML IV SOLN
500.0000 [IU] | Freq: Once | INTRAVENOUS | Status: AC | PRN
  Administered 2023-02-16: 500 [IU]

## 2023-02-16 MED ORDER — SODIUM CHLORIDE 0.9 % IV SOLN: INTRAVENOUS | Status: DC

## 2023-02-16 MED ORDER — SODIUM CHLORIDE 0.9% FLUSH
10.0000 mL | INTRAVENOUS | Status: DC | PRN
Start: 2023-02-16 — End: 2023-02-16
  Administered 2023-02-16: 10 mL

## 2023-02-16 MED ORDER — SODIUM CHLORIDE 0.9 % IV SOLN
75.0000 mg/m2 | Freq: Once | INTRAVENOUS | Status: AC
  Administered 2023-02-16: 136 mg via INTRAVENOUS
  Filled 2023-02-16: qty 13.6

## 2023-02-16 MED ORDER — DIPHENHYDRAMINE HCL 50 MG/ML IJ SOLN
50.0000 mg | Freq: Once | INTRAMUSCULAR | Status: AC
Start: 2023-02-16 — End: 2023-02-16
  Administered 2023-02-16: 50 mg via INTRAVENOUS
  Filled 2023-02-16: qty 1

## 2023-02-16 NOTE — Progress Notes (Signed)
Palliative Medicine [facility]  Telephone:([phone]) [phone] [phone]   Name: Terry Page. Date: 02/16/2023 MRN: [id number]  DOB: [birth year]  Patient Care Team: Aliene Beams, MD as PCP - General (Family Medicine)    INTERVAL HISTORY: Terry Franchini. is a 68 y.o. 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 He is currently undergoing chemotherapy. Palliative ask to see for symptom management.  SOCIAL HISTORY:    Terry Page reports that he quit smoking about 3 years ago. His smoking use included cigarettes. He has never used smokeless tobacco. He reports current alcohol use of about 12.0 standard drinks of alcohol per week. He reports that he does not use drugs.  ADVANCE DIRECTIVES:  None on file   CODE STATUS: Full Code  PAST MEDICAL HISTORY: Past Medical History:  Diagnosis Date   Alcohol abuse    Asthma    Bullous emphysema ([name])    Essential hypertension 04/10/2020   Hemorrhoids    History of radiation therapy 04/08/20-04/19/20   IMRT- Left lung- Dr. Antony Blackbird   Incidental lung nodule, greater than or equal to 8mm 10/22/2017   Left upper lobe - discovered on CTA   Prostate cancer ([name])    Spontaneous pneumothorax 10/20/2017   right   Tobacco abuse     ALLERGIES:  is allergic to morphine and oxycodone.  MEDICATIONS:  Current Outpatient Medications  Medication Sig Dispense Refill   dronabinol (MARINOL) 5 MG capsule Take 1 capsule (5 mg total) by mouth 2 (two) times daily before lunch and supper. 60 capsule 0   acetaminophen (TYLENOL) 325 MG tablet Take 2 tablets (650 mg total) by mouth every 6 (six) hours as needed for mild pain or headache (fever >/= 101).     amLODipine (NORVASC) 10 MG tablet Take 1 tablet by mouth daily.     atorvastatin (LIPITOR) 20 MG tablet Take 20 mg by  mouth daily.     cyanocobalamin (VITAMIN B12) 1000 MCG tablet Take 1 tablet (1,000 mcg total) by mouth daily. 90 tablet 1   dexamethasone (DECADRON) 4 MG tablet Take 2 tabs by mouth 2 times daily starting day before chemo. Then take 2 tabs daily for 2 days starting day after chemo. Take with food. 30 tablet 1   ferrous sulfate 325 (65 FE) MG EC tablet Take 1 tablet (325 mg total) by mouth daily with breakfast. 30 tablet 3   folic acid (FOLVITE) 1 MG tablet Take 1 tablet (1 mg total) by mouth daily. 30 tablet 3   hydrOXYzine (ATARAX) 25 MG tablet Take 1 tablet (25 mg total) by mouth at bedtime as needed for itching. 30 tablet 11   ipratropium-albuterol (DUONEB) 0.5-2.5 (3) MG/3ML SOLN Take 3 mLs by nebulization every 6 (six) hours as needed. (Patient taking differently: Take 3 mLs by nebulization every 6 (six) hours as needed (shortness of breathing or wheezing).) 360 mL 0   lidocaine (LIDODERM) 5 % Place 1 patch onto the skin daily. Remove & Discard patch within 12 hours or as directed by MD. Apply to low back 30 patch 0   lidocaine-prilocaine (EMLA) cream Apply 1 Application topically as needed. 30 g 0   losartan (COZAAR) 100 MG tablet Take 100 mg by mouth daily.     olmesartan (BENICAR) 40 MG tablet Take 40 mg by mouth daily.     ondansetron (  ZOFRAN) 8 MG tablet Take 1 tablet (8 mg total) by mouth every 8 (eight) hours as needed for nausea or vomiting. 30 tablet 1   polyethylene glycol (MIRALAX) 17 g packet Take 17 g by mouth daily. 30 each 2   potassium chloride SA (KLOR-CON M) 20 MEQ tablet TAKE 1 TABLET(20 MEQ) BY MOUTH DAILY 90 tablet 0   prochlorperazine (COMPAZINE) 10 MG tablet Take 1 tablet (10 mg total) by mouth every 6 (six) hours as needed for nausea or vomiting. 30 tablet 1   senna-docusate (STIMULANT LAXATIVE) 8.6-50 MG tablet Take 2 tablets by mouth 2 (two) times daily. May add additional tablet as needed 150 tablet 3   sodium chloride 1 g tablet Take 1 tablet (1 g total) by mouth 3  (three) times daily with meals. 90 tablet 3   thiamine 100 MG tablet Take 1 tablet (100 mg total) by mouth daily.     traMADol (ULTRAM) 50 MG tablet Take 1-2 tablets (50-100 mg total) by mouth every 6 (six) hours as needed. 120 tablet 0   No current facility-administered medications for this visit.   Facility-Administered Medications Ordered in Other Visits  Medication Dose Route Frequency Provider Last Rate Last Admin   0.9 %  sodium chloride infusion   Intravenous Continuous Jaci Standard, MD   Stopped at 02/16/23 1156   sodium chloride flush (NS) 0.9 % injection 10 mL  10 mL Intracatheter PRN Ulysees Barns IV, MD   10 mL at 02/16/23 1159    VITAL SIGNS: There were no vitals taken for this visit. There were no vitals filed for this visit.   Estimated body mass index is 22.83 kg/m as calculated from the following:   Height as of 01/05/23: 5\' 9"  (1.753 m).   Weight as of an earlier encounter on 02/16/23: 154 lb 9.6 oz (70.1 kg).   PERFORMANCE STATUS (ECOG) : 1 - Symptomatic but completely ambulatory  Assessment NAD RRR Normal breathing pattern AAO x4  Discussed the use of AI scribe software for clinical note transcription with the patient, who gave verbal consent to proceed.   IMPRESSION:  I saw Terry Page. during his infusion. Tolerating without difficulty. No issues with constipation. He has adjusted his medication regimen and takes a stool softener in the morning, resulting in regular bowel movements. Denies nausea, vomiting, or diarrhea. Occasional fatigue. Continues to take things one day at time.   His appetite is variable, with some days being better than others. He eats more meals but in smaller portions to manage his intake and has noticed some weight loss recently. His current weight is 154lbs down from 157lbs on 1/3. He is worried about the changes in his appetite and inquiries about appetite support. Education provided on use of appetite stimulant  including use, administration, efficacy, and side effects of Marinol. He verbalized understanding.   Terry Page states he is experiencing worsening pain and tingling in the toes, which is more severe at night. The pain is in his lower back radiating down his left leg. Descriptions tend to lean more to sciatic related pain. He shares this was also discussed today with his oncology team and plans are in place for further evaluation. This discomfort has led to increased medication use to manage the symptoms. He takes two Tramadol as needed, sometimes requiring a dose in four hours instead of six, to manage severe pain. Additionally, he uses Tylenol in between doses, which he feels is helping. He  feels his pain is effectively managed with current regimen. No adjustments or changes at this time.   All questions answered and support provided.  Assessment and Plan  Chronic Cancer Related Pain Worsening pain at night and tingling in toes. Pain from lower back now radiating down left leg and is sciatic in nature. Increased frequency of pain medication use. Referral to neurosurgeon is in place per oncology.  -Continue current pain medication regimen. -Tramadol 50-100mg  every 4-6 hours as needed. -Contact office with any changes or worsening symptoms. Patient is sensitive to medications. Could consider low dose gabapentin if needed in future.   Constipation Improved with regular use of Senna. -Continue current regimen of Senna twice daily and monitor bowel movements.  Weight Loss Patient reports intentional weight loss through smaller, more frequent meals. -Monitor weight to ensure it remains stable. -Discussed ways to increase appetite and protein intake -Marinol 5mg  twice daily  Appetite Variable, but patient is still eating. -Monitor appetite and weight.  Follow-up in 2-3 weeks.Sooner if needed.  Patient expressed understanding and was in agreement with this plan. He also understands that He can  call the clinic at any time with any questions, concerns, or complaints.   Any controlled substances utilized were prescribed in the context of palliative care. PDMP has been reviewed.    Visit consisted of counseling and education dealing with the complex and emotionally intense issues of symptom management and palliative care in the setting of serious and potentially life-threatening illness.  [name], AGPCNP-BC  Palliative Medicine Team/[facility]

## 2023-02-16 NOTE — Telephone Encounter (Signed)
Will make referral to Neurosurgery to evaluate for steroid injections for left sided low back pain  Referral faxed to [facility].  Confirmation received

## 2023-02-16 NOTE — Patient Instructions (Signed)
[facility] A DEPT [facility]  Discharge Instructions: Thank you for choosing [facility] to provide your oncology and hematology care.   If you have a lab appointment with the Cancer Center, please go directly to the Cancer Center and check in at the registration area.   Wear comfortable clothing and clothing appropriate for easy access to any Portacath or PICC line.   We strive to give you quality time with your provider. You may need to reschedule your appointment if you arrive late (15 or more minutes).  Arriving late affects you and other patients whose appointments are after yours.  Also, if you miss three or more appointments without notifying the office, you may be dismissed from the clinic at the provider's discretion.      For prescription refill requests, have your pharmacy contact our office and allow 72 hours for refills to be completed.    Today you received the following chemotherapy and/or immunotherapy agents: Ramucirumab (Cyramza) and Docetaxel (Taxotere)      To help prevent nausea and vomiting after your treatment, we encourage you to take your nausea medication as directed.  BELOW ARE SYMPTOMS THAT SHOULD BE REPORTED IMMEDIATELY: *FEVER GREATER THAN 100.4 F (38 C) OR HIGHER *CHILLS OR SWEATING *NAUSEA AND VOMITING THAT IS NOT CONTROLLED WITH YOUR NAUSEA MEDICATION *UNUSUAL SHORTNESS OF BREATH *UNUSUAL BRUISING OR BLEEDING *URINARY PROBLEMS (pain or burning when urinating, or frequent urination) *BOWEL PROBLEMS (unusual diarrhea, constipation, pain near the anus) TENDERNESS IN MOUTH AND THROAT WITH OR WITHOUT PRESENCE OF ULCERS (sore throat, sores in mouth, or a toothache) UNUSUAL RASH, SWELLING OR PAIN  UNUSUAL VAGINAL DISCHARGE OR ITCHING   Items with * indicate a potential emergency and should be followed up as soon as possible or go to the Emergency Department if any problems should occur.  Please show the  CHEMOTHERAPY ALERT CARD or IMMUNOTHERAPY ALERT CARD at check-in to the Emergency Department and triage nurse.  Should you have questions after your visit or need to cancel or reschedule your appointment, please contact [facility]  Dept: [phone]  and follow the prompts.  Office hours are 8:00 a.m. to 4:30 p.m. Monday - Friday. Please note that voicemails left after 4:00 p.m. may not be returned until the following business day.  We are closed weekends and major holidays. You have access to a nurse at all times for urgent questions. Please call the main number to the clinic Dept: [phone] and follow the prompts.   For any non-urgent questions, you may also contact your provider using MyChart. We now offer e-Visits for anyone 50 and older to request care online for non-urgent symptoms. For details visit [email or web address].   Also download the MyChart app! Go to the app store, search "MyChart", open the app, select [place], and log in with your MyChart username and password.

## 2023-02-16 NOTE — Progress Notes (Signed)
[facility], [facility] [phone]   [phone]  PROGRESS NOTE  Patient Care Team: Aliene Beams, MD as PCP - General (Family Medicine)  Hematological/Oncological History # Metastatic Adenocarcinoma of the Lung  01/07/2020 : CT of the chest with contrast performed which showed interval enlargement of a spiculated nodule in the central left upper lobe measuring 1.2 x 1.0 cm and highly suspicious for primary lung malignancy 02/06/2020 :PET scan performed which showed a hypermetabolic irregular solid 1.3 cm left upper lobe pulmonary nodule compatible with malignancy without any other hypermetabolic lesions 07/03/5364- 04/19/2020:  SBRT to the lung lesion 04/28/2021: CT C/A/P showed multifocal lytic bone metastases are identified. Lesion within the spine of the left scapula and lesions involving bilateral iliac bones and left sacral wing. Establish care with Dr. Leonides Schanz  05/03/2021: biopsy of right iliac lytic lesion showed metastatic carcinoma, consistent with lung primary.  07/08/2021: Cycle 1 Day 1 of Carbo/Pem/Pem 07/29/2021: Cycle 2 Day 1 of Carbo/Pem/Pem 08/11/2021-08/24/2021: Received palliative radiation to osseous metastasis in the left shoulder. 30 Gy in 10 fractions.  08/19/2021: Cycle 3 Day 1 of Carbo/Pem/Pem 09/09/2021: Cycle 4 Day 1 of Carbo/Pem/Pem 09/23/2021: CT CAP: stable disease 09/30/2021: Cycle 5 Day 1 of Pem/Pem 10/21/2021: Cycle 6 Day 1 of Pembrolizumab. Held pemetrexed due to anemia. 11/16/2021: Cycle 7 Day 1 of Pem/Pem.   12/16/2021: Cycle 8 Day 1 of Pem/Pem.   01/06/2022: Cycle 9 Day 1 of Pem/Pem.  01/27/2022: Cycle 10 Day 1 of Pem/Pem 02/17/2022: Cycle 11 Day 1 of Pem/Pem 03/10/2022: Cycle 12 Day 1 of Pem/Pem (HELD due to anemia with Hgb of 6.4) 03/17/2022: Cycle 12 Day 1 of Pem/Pem (HELD pemextrexed due to cytopenias/kidney function) 04/10/2022: Cycle 13 Day 1 of Pem/Pem (HELD pemextrexed due to cytopenias/kidney function) 04/28/2022: Cycle 14 Day 1 of Pem/Pem  (HELD pemextrexed due to cytopenias/kidney function) 05/23/2022:  Cycle 15 Day 1 of Pem/Pem (HELD pemextrexed due to cytopenias/kidney function) 06/15/2022: Cycle 16 Day 1 of Pembrolizumab.  07/07/2022: Cycle 17 Day 1 of Pembrolizumab.  07/28/2022: Cycle 18 Day 1 of Pembrolizumab 08/18/2022: Cycle 19 Day 1 of Pembrolizumab HELD due to hyponatremia of 118, new bone metastases. 08/25/2022: Cycle 19 Day 1 of Pembrolizumab 08/28/2022-09/13/2022: Received palliative radiation to right pelvis with total dose of 30 Gy in 10 Fx.  09/22/2022:  Cycle 20 Day 1 of Pembrolizumab 10/13/2022:  Cycle 21 Day 1 of Pembrolizumab 12/11/2022: Cycle 1 Day 1 of Docetaxel/Ramucircumab.  01/05/2023: Cycle 2 Day 1 of Docetaxel/Ramucircumab.  01/26/2023: Cycle 3 Day 1 of Docetaxel/Ramucircumab 02/16/2023:  Cycle 4 Day 1 of Docetaxel/Ramucircumab  #Adenocarcinoma of the Prostate, T1cN0M0 08/19/2019-10/07/2019: 70 Gy in 28 fractions of 2.5 Gy.  Radiation to the prostate was under the care of Dr. Margaretmary Dys  Interval History:  Terry Page. 68 y.o. male with medical history significant for metastatic adenocarcinoma of the lung who presents for a follow up visit. The patient's last visit was on 01/26/2023. In the interim since the last visit he completed Cycle 3 of Docetaxel/Ramucircumab.   On exam today, Terry Page states his energy levels and appetite have been good. His back pain is manageable with tramadol. He is still experiening nail discoloration and now is having tenderness of his fingertips. He denies any nausea/vomiting, bowel changes, chest pain, shortness of breath, headache, cough, and dizziness.  He reports his hip pain is well-controlled with tramadol that he takes as needed.  His weight is stable at 154 pounds.  He has no other complaints. A full  10 point ROS is listed below.  MEDICAL HISTORY:  Past Medical History:  Diagnosis Date   Alcohol abuse    Asthma    Bullous emphysema ([name])    Essential hypertension  04/10/2020   Hemorrhoids    History of radiation therapy 04/08/20-04/19/20   IMRT- Left lung- Dr. Antony Blackbird   Incidental lung nodule, greater than or equal to 8mm 10/22/2017   Left upper lobe - discovered on CTA   Prostate cancer ([name])    Spontaneous pneumothorax 10/20/2017   right   Tobacco abuse     SURGICAL HISTORY: Past Surgical History:  Procedure Laterality Date   BACK SURGERY     IR IMAGING GUIDED PORT INSERTION  06/29/2021   PROSTATE BIOPSY      SOCIAL HISTORY: Social History   Socioeconomic History   Marital status: Single    Spouse name: Not on file   Number of children: Not on file   Years of education: Not on file   Highest education level: Not on file  Occupational History   Occupation: retired  Tobacco Use   Smoking status: Former    Current packs/day: 0.00    Types: Cigarettes    Quit date: 11/22/2019    Years since quitting: 3.2   Smokeless tobacco: Never   Tobacco comments:    Patient reports quit 3 years ago. 06/25/20. [facility]  Vaping Use   Vaping status: Never Used  Substance and Sexual Activity   Alcohol use: Yes    Alcohol/week: 12.0 standard drinks of alcohol    Types: 12 Cans of beer per week    Comment: daily sometimes   Drug use: No   Sexual activity: Not Currently  Other Topics Concern   Not on file  Social History Narrative   Not on file   Social Drivers of Health   Financial Resource Strain: Not on file  Food Insecurity: Not on file  Transportation Needs: Unmet Transportation Needs (08/18/2022)   PRAPARE - Administrator, Civil Service (Medical): Yes    Lack of Transportation (Non-Medical): No  Physical Activity: Not on file  Stress: Not on file  Social Connections: Not on file  Intimate Partner Violence: Not on file    FAMILY HISTORY: Family History  Problem Relation Age of Onset   Cancer Cousin        maternal cousin   Cancer Cousin        paternal cousin   Cancer Cousin    Colon polyps Neg Hx     Pancreatic disease Neg Hx    Pancreatic cancer Neg Hx    Breast cancer Neg Hx    Colon cancer Neg Hx     ALLERGIES:  is allergic to morphine and oxycodone.  MEDICATIONS:  Current Outpatient Medications  Medication Sig Dispense Refill   acetaminophen (TYLENOL) 325 MG tablet Take 2 tablets (650 mg total) by mouth every 6 (six) hours as needed for mild pain or headache (fever >/= 101).     amLODipine (NORVASC) 10 MG tablet Take 1 tablet by mouth daily.     atorvastatin (LIPITOR) 20 MG tablet Take 20 mg by mouth daily.     cyanocobalamin (VITAMIN B12) 1000 MCG tablet Take 1 tablet (1,000 mcg total) by mouth daily. 90 tablet 1   dexamethasone (DECADRON) 4 MG tablet Take 2 tabs by mouth 2 times daily starting day before chemo. Then take 2 tabs daily for 2 days starting day after chemo. Take with food. 30 tablet  1   ferrous sulfate 325 (65 FE) MG EC tablet Take 1 tablet (325 mg total) by mouth daily with breakfast. 30 tablet 3   folic acid (FOLVITE) 1 MG tablet Take 1 tablet (1 mg total) by mouth daily. 30 tablet 3   hydrOXYzine (ATARAX) 25 MG tablet Take 1 tablet (25 mg total) by mouth at bedtime as needed for itching. 30 tablet 11   ipratropium-albuterol (DUONEB) 0.5-2.5 (3) MG/3ML SOLN Take 3 mLs by nebulization every 6 (six) hours as needed. (Patient taking differently: Take 3 mLs by nebulization every 6 (six) hours as needed (shortness of breathing or wheezing).) 360 mL 0   lidocaine (LIDODERM) 5 % Place 1 patch onto the skin daily. Remove & Discard patch within 12 hours or as directed by MD. Apply to low back 30 patch 0   lidocaine-prilocaine (EMLA) cream Apply 1 Application topically as needed. 30 g 0   losartan (COZAAR) 100 MG tablet Take 100 mg by mouth daily.     olmesartan (BENICAR) 40 MG tablet Take 40 mg by mouth daily.     ondansetron (ZOFRAN) 8 MG tablet Take 1 tablet (8 mg total) by mouth every 8 (eight) hours as needed for nausea or vomiting. 30 tablet 1   polyethylene glycol  (MIRALAX) 17 g packet Take 17 g by mouth daily. 30 each 2   potassium chloride SA (KLOR-CON M) 20 MEQ tablet TAKE 1 TABLET(20 MEQ) BY MOUTH DAILY 90 tablet 0   prochlorperazine (COMPAZINE) 10 MG tablet Take 1 tablet (10 mg total) by mouth every 6 (six) hours as needed for nausea or vomiting. 30 tablet 1   senna-docusate (STIMULANT LAXATIVE) 8.6-50 MG tablet Take 2 tablets by mouth 2 (two) times daily. May add additional tablet as needed 150 tablet 3   sodium chloride 1 g tablet Take 1 tablet (1 g total) by mouth 3 (three) times daily with meals. 90 tablet 3   thiamine 100 MG tablet Take 1 tablet (100 mg total) by mouth daily.     traMADol (ULTRAM) 50 MG tablet Take 1-2 tablets (50-100 mg total) by mouth every 6 (six) hours as needed. 120 tablet 0   No current facility-administered medications for this visit.    REVIEW OF SYSTEMS:   Constitutional: ( - ) fevers, ( - )  chills , ( - ) night sweats Eyes: ( - ) blurriness of vision, ( - ) double vision, ( - ) watery eyes Ears, nose, mouth, throat, and face: ( - ) mucositis, ( - ) sore throat Respiratory: ( - ) cough, ( - ) dyspnea, ( - ) wheezes Cardiovascular: ( - ) palpitation, ( - ) chest discomfort, ( - ) lower extremity swelling Gastrointestinal:  ( - ) nausea, ( - ) heartburn, ( - ) change in bowel habits Skin: ( - ) abnormal skin rashes Lymphatics: ( - ) new lymphadenopathy, ( - ) easy bruising Neurological: ( - ) numbness, ( - ) tingling, ( - ) new weaknesses Behavioral/Psych: ( - ) mood change, ( - ) new changes  All other systems were reviewed with the patient and are negative.  PHYSICAL EXAMINATION: ECOG PERFORMANCE STATUS: 1 - Symptomatic but completely ambulatory  Vitals:   02/16/23 0850  BP: (!) 142/93  Pulse: (!) 111  Resp: 16  Temp: 98.5 F (36.9 C)  SpO2: 98%     Filed Weights   02/16/23 0850  Weight: 154 lb 9.6 oz (70.1 kg)   GENERAL: Well-appearing elderly African-American male,  alert, no distress and  comfortable SKIN: skin color, texture, turgor are normal, no rashes or significant lesions EYES: conjunctiva are pink and non-injected, sclera clear LUNGS: clear to auscultation and percussion with normal breathing effort HEART: regular rate & rhythm and no murmurs and no lower extremity edema Musculoskeletal: no cyanosis of digits and no clubbing  PSYCH: alert & oriented x 3, fluent speech NEURO: no focal motor/sensory deficits  LABORATORY DATA:  I have reviewed the data as listed    Latest Ref Rng & Units 02/16/2023    7:57 AM 01/26/2023    9:39 AM 01/12/2023   11:36 AM  CBC  WBC 4.0 - 10.5 K/uL 46.5  26.7  6.6   Hemoglobin 13.0 - 17.0 g/dL 16.1  09.6  9.5   Hematocrit 39.0 - 52.0 % 33.4  31.2  28.2   Platelets 150 - 400 K/uL 410  519  160        Latest Ref Rng & Units 02/16/2023    7:57 AM 01/26/2023    9:39 AM 01/12/2023   11:36 AM  CMP  Glucose 70 - 99 mg/dL 045  409  97   BUN 8 - 23 mg/dL 18  9  10    Creatinine 0.61 - 1.24 mg/dL 8.11  9.14  7.82   Sodium 135 - 145 mmol/L 130  132  132   Potassium 3.5 - 5.1 mmol/L 4.4  4.3  4.4   Chloride 98 - 111 mmol/L 99  98  98   CO2 22 - 32 mmol/L 24  26  29    Calcium 8.9 - 10.3 mg/dL 9.2  9.4  9.4   Total Protein 6.5 - 8.1 g/dL 7.0  7.1  6.6   Total Bilirubin 0.0 - 1.2 mg/dL 0.3  0.2  0.4   Alkaline Phos 38 - 126 U/L 90  82  91   AST 15 - 41 U/L 13  13  11    ALT 0 - 44 U/L 13  9  13      Lab Results  Component Value Date   MPROTEIN Not Observed 04/28/2021   Lab Results  Component Value Date   KPAFRELGTCHN 33.6 (H) 04/28/2021   LAMBDASER 22.0 04/28/2021   KAPLAMBRATIO 1.53 04/28/2021    RADIOGRAPHIC STUDIES: No results found.  ASSESSMENT & PLAN Terry Page. Is a 68 y.o.  male with medical history significant for metastatic adenocarcinoma of the lung who presents for a follow up visit.  # Metastatic Adenocarcinoma of the Lung  -- NGS testing from this patient shows no evidence of targetable mutation. --MRI  brain shows no evidence of intracranial metastases --Started carboplatin, pembrolizumab, and pemetrexed as treatment for his cancer on 07/08/2021. --Received palliative radiation to osseous metastasis of the left shoulder from 08/11/2021-08/24/2021. Planned dose is 30 Gy in 10 fx.  --Discontinued Pemetrexed on 03/17/2022 due to persistent anemia and kidney dysfunction. --Restaging CT CAP from 08/09/2022 shows interval enlargement of multiple mixed lytic and sclerotic bone metastases involving the right pelvis. Other bone metastases including of the left scapula, sacrum, and left hemipelvis are unchanged. Unchanged treated central LUL mass. Recommend to continue on pembrolizumab therapy and arrange for palliative radiation to enlarging bone metastases.  Plan: --today is Cycle 4 Day 1 of Docetaxel/Ramucirumab.  --labs from today were reviewed. Labs show white blood cell count 46.5 (secondary to growth factor), hemoglobin 10.9, MCV 89.1, platelets 410 Creatinine and LFTs normal.  --CT scan from 02/15/2023 is pending.  --RTC in 3 week  with labs and follow up visit before Cycle 5.   #Left sided low back pain with sciatica: --History of lumbar spondylosis and moderate to severe facet arthrosis without spinal stenosis and mild right neural foraminal stenosis at L4-5 seen on MRI lumbar 04/24/2020.  --Will make referral to Neurosurgery to evaluate for steroid injections  #Nail toxicities: --Patient has discoloration of nail bed with some tenderness --Advised to monitor for infection and do warm salt water soaks 4x/day  #Leukocytosis-neutrophil predominant: --Patient denies any infectious symptoms, elevation is likely secondary to growth factor --Discussed with Dr. Leonides Schanz who agrees to hold growth factor for this cycle and re-assess.   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  --Pain is slightly improved but still present.  --Continue to take tramadol q 6 hours for pain control.   #Hyponatremia-improved --Chronic in nature --Sodium level dropped from 127 on 08/09/2022 to 118.  Today sodium stable to 130.  --Etiologies including SIADH, immunotherapy induced, too much water intake.  --Patient is asymptomatic without nausea/vomiting, headaches, confusion, seizures, etc --Currently on salt tablets 2 gm PO daily. We advised to titrate to twice daily x 7 days and then three times daily.  --Strict ED precautions given for any new symptoms as discussed above.   # Iron Deficiency Anemia #Chemotherapy induced anemia --Hgb level 10.9, stable today.  --Currently on ferrous sulfate 325 mg PO daily  #Pain Control --stopped oxycodone due to itching --currently following with Palliative care.  --pain well controlled with tramadol as needed. Continue to take tramdol 50-100 mg q 6 hours as needed. --Continue senna docusate and miralax for constipation prophylaxis --Continue to monitor  #Supportive Care -- chemotherapy education complete -- port placed -- zofran 8mg  q8H PRN and compazine 10mg  PO q6H for nausea -- EMLA cream for port -- Patient referred to palliative care for pain control. Pain control as above.    No orders of the defined types were placed in this encounter.   All questions were answered. The patient knows to call the clinic with any problems, questions or concerns.  I have spent a total of 30 minutes minutes of face-to-face and non-face-to-face time, preparing to see the patient, performing a medically appropriate examination, counseling and educating the patient, referring and communicating with other health care professionals, documenting clinical information in the electronic health record, and care coordination.   Georga Kaufmann PA-C Dept of Hematology and Oncology [facility] at  [facility] Phone: [phone]   02/16/2023 9:14 AM

## 2023-02-19 ENCOUNTER — Ambulatory Visit: Payer: Medicare HMO

## 2023-02-20 ENCOUNTER — Encounter: Payer: Self-pay | Admitting: Family Medicine

## 2023-02-20 ENCOUNTER — Other Ambulatory Visit (HOSPITAL_COMMUNITY): Payer: Medicare HMO

## 2023-02-21 ENCOUNTER — Encounter: Payer: Self-pay | Admitting: Hematology and Oncology

## 2023-02-21 ENCOUNTER — Other Ambulatory Visit: Payer: Self-pay | Admitting: Nurse Practitioner

## 2023-02-26 ENCOUNTER — Inpatient Hospital Stay (HOSPITAL_BASED_OUTPATIENT_CLINIC_OR_DEPARTMENT_OTHER): Payer: Medicare HMO | Admitting: Physician Assistant

## 2023-02-26 ENCOUNTER — Telehealth: Payer: Self-pay | Admitting: *Deleted

## 2023-02-26 VITALS — BP 101/74 | HR 118 | Temp 97.8°F | Resp 18 | Ht 69.0 in | Wt 151.5 lb

## 2023-02-26 DIAGNOSIS — H00015 Hordeolum externum left lower eyelid: Secondary | ICD-10-CM | POA: Diagnosis not present

## 2023-02-26 DIAGNOSIS — C3412 Malignant neoplasm of upper lobe, left bronchus or lung: Secondary | ICD-10-CM | POA: Diagnosis not present

## 2023-02-26 DIAGNOSIS — Z5112 Encounter for antineoplastic immunotherapy: Secondary | ICD-10-CM | POA: Diagnosis not present

## 2023-02-26 MED ORDER — DEXAMETHASONE 4 MG PO TABS: ORAL_TABLET | ORAL | 1 refills | Status: DC

## 2023-02-26 MED ORDER — DOXYCYCLINE HYCLATE 100 MG PO TABS: 100.0000 mg | ORAL_TABLET | Freq: Two times a day (BID) | ORAL | 0 refills | Status: DC

## 2023-02-26 NOTE — Progress Notes (Signed)
 [facility] [phone]   [phone]  PROGRESS NOTE  Patient Care Team: Aliene Beams, MD as PCP - General (Family Medicine)  Hematological/Oncological History # Metastatic Adenocarcinoma of the Lung  01/07/2020 : CT of the chest with contrast performed which showed interval enlargement of a spiculated nodule in the central left upper lobe measuring 1.2 x 1.0 cm and highly suspicious for primary lung malignancy 02/06/2020 :PET scan performed which showed a hypermetabolic irregular solid 1.3 cm left upper lobe pulmonary nodule compatible with malignancy without any other hypermetabolic lesions 04/06/4096- 04/19/2020:  SBRT to the lung lesion 04/28/2021: CT C/A/P showed multifocal lytic bone metastases are identified. Lesion within the spine of the left scapula and lesions involving bilateral iliac bones and left sacral wing. Establish care with Dr. Leonides Schanz  05/03/2021: biopsy of right iliac lytic lesion showed metastatic carcinoma, consistent with lung primary.  07/08/2021: Cycle 1 Day 1 of Carbo/Pem/Pem 07/29/2021: Cycle 2 Day 1 of Carbo/Pem/Pem 08/11/2021-08/24/2021: Received palliative radiation to osseous metastasis in the left shoulder. 30 Gy in 10 fractions.  08/19/2021: Cycle 3 Day 1 of Carbo/Pem/Pem 09/09/2021: Cycle 4 Day 1 of Carbo/Pem/Pem 09/23/2021: CT CAP: stable disease 09/30/2021: Cycle 5 Day 1 of Pem/Pem 10/21/2021: Cycle 6 Day 1 of Pembrolizumab. Held pemetrexed due to anemia. 11/16/2021: Cycle 7 Day 1 of Pem/Pem.   12/16/2021: Cycle 8 Day 1 of Pem/Pem.   01/06/2022: Cycle 9 Day 1 of Pem/Pem.  01/27/2022: Cycle 10 Day 1 of Pem/Pem 02/17/2022: Cycle 11 Day 1 of Pem/Pem 03/10/2022: Cycle 12 Day 1 of Pem/Pem (HELD due to anemia with Hgb of 6.4) 03/17/2022: Cycle 12 Day 1 of Pem/Pem (HELD pemextrexed due to cytopenias/kidney function) 04/10/2022: Cycle 13 Day 1 of Pem/Pem (HELD pemextrexed due to cytopenias/kidney function) 04/28/2022: Cycle 14 Day 1 of Pem/Pem  (HELD pemextrexed due to cytopenias/kidney function) 05/23/2022:  Cycle 15 Day 1 of Pem/Pem (HELD pemextrexed due to cytopenias/kidney function) 06/15/2022: Cycle 16 Day 1 of Pembrolizumab.  07/07/2022: Cycle 17 Day 1 of Pembrolizumab.  07/28/2022: Cycle 18 Day 1 of Pembrolizumab 08/18/2022: Cycle 19 Day 1 of Pembrolizumab HELD due to hyponatremia of 118, new bone metastases. 08/25/2022: Cycle 19 Day 1 of Pembrolizumab 08/28/2022-09/13/2022: Received palliative radiation to right pelvis with total dose of 30 Gy in 10 Fx.  09/22/2022:  Cycle 20 Day 1 of Pembrolizumab 10/13/2022:  Cycle 21 Day 1 of Pembrolizumab 12/11/2022: Cycle 1 Day 1 of Docetaxel/Ramucircumab.  01/05/2023: Cycle 2 Day 1 of Docetaxel/Ramucircumab.  01/26/2023: Cycle 3 Day 1 of Docetaxel/Ramucircumab 02/16/2023:  Cycle 4 Day 1 of Docetaxel/Ramucircumab  #Adenocarcinoma of the Prostate, T1cN0M0 08/19/2019-10/07/2019: 70 Gy in 28 fractions of 2.5 Gy.  Radiation to the prostate was under the care of Dr. Margaretmary Dys  Interval History:  Terry Page. 68 y.o. male with medical history significant for metastatic adenocarcinoma of the lung who presents for a symptom management visit after noticing a bump on his left lower eyelid yesterday that was bleeding. He report there is some tenderness when he palpates the bump but no purulent discharge. He adds that his left eye has been watery. He denies any vision changes or other infectious symptoms. He is otherwise doing well without any new or concerning symptoms. He denies fevers, chills, sweats, shortness of breath, chest pain or cough. He has no other complaints. A full 10 point ROS is listed below.  MEDICAL HISTORY:  Past Medical History:  Diagnosis Date   Alcohol abuse    Asthma  Bullous emphysema ([name])    Essential hypertension 04/10/2020   Hemorrhoids    History of radiation therapy 04/08/20-04/19/20   IMRT- Left lung- Dr. Antony Blackbird   Incidental lung nodule, greater than or  equal to 8mm 10/22/2017   Left upper lobe - discovered on CTA   Prostate cancer ([name])    Spontaneous pneumothorax 10/20/2017   right   Tobacco abuse     SURGICAL HISTORY: Past Surgical History:  Procedure Laterality Date   BACK SURGERY     IR IMAGING GUIDED PORT INSERTION  06/29/2021   PROSTATE BIOPSY      SOCIAL HISTORY: Social History   Socioeconomic History   Marital status: Single    Spouse name: Not on file   Number of children: Not on file   Years of education: Not on file   Highest education level: Not on file  Occupational History   Occupation: retired  Tobacco Use   Smoking status: Former    Current packs/day: 0.00    Types: Cigarettes    Quit date: 11/22/2019    Years since quitting: 3.2   Smokeless tobacco: Never   Tobacco comments:    Patient reports quit 3 years ago. 06/25/20. [facility]  Vaping Use   Vaping status: Never Used  Substance and Sexual Activity   Alcohol use: Yes    Alcohol/week: 12.0 standard drinks of alcohol    Types: 12 Cans of beer per week    Comment: daily sometimes   Drug use: No   Sexual activity: Not Currently  Other Topics Concern   Not on file  Social History Narrative   Not on file   Social Drivers of Health   Financial Resource Strain: Not on file  Food Insecurity: Not on file  Transportation Needs: Unmet Transportation Needs (08/18/2022)   PRAPARE - Administrator, Civil Service (Medical): Yes    Lack of Transportation (Non-Medical): No  Physical Activity: Not on file  Stress: Not on file  Social Connections: Not on file  Intimate Partner Violence: Not on file    FAMILY HISTORY: Family History  Problem Relation Age of Onset   Cancer Cousin        maternal cousin   Cancer Cousin        paternal cousin   Cancer Cousin    Colon polyps Neg Hx    Pancreatic disease Neg Hx    Pancreatic cancer Neg Hx    Breast cancer Neg Hx    Colon cancer Neg Hx     ALLERGIES:  is allergic to morphine and  oxycodone.  MEDICATIONS:  Current Outpatient Medications  Medication Sig Dispense Refill   doxycycline (VIBRA-TABS) 100 MG tablet Take 1 tablet (100 mg total) by mouth 2 (two) times daily. 14 tablet 0   acetaminophen (TYLENOL) 325 MG tablet Take 2 tablets (650 mg total) by mouth every 6 (six) hours as needed for mild pain or headache (fever >/= 101).     amLODipine (NORVASC) 10 MG tablet Take 1 tablet by mouth daily.     atorvastatin (LIPITOR) 20 MG tablet Take 20 mg by mouth daily.     cyanocobalamin (VITAMIN B12) 1000 MCG tablet Take 1 tablet (1,000 mcg total) by mouth daily. 90 tablet 1   dexamethasone (DECADRON) 4 MG tablet Take 2 tabs by mouth 2 times daily starting day before chemo. Then take 2 tabs daily for 2 days starting day after chemo. Take with food. 30 tablet 1   dronabinol (MARINOL)  5 MG capsule Take 1 capsule (5 mg total) by mouth 2 (two) times daily before lunch and supper. 60 capsule 0   ferrous sulfate 325 (65 FE) MG EC tablet Take 1 tablet (325 mg total) by mouth daily with breakfast. 30 tablet 3   folic acid (FOLVITE) 1 MG tablet Take 1 tablet (1 mg total) by mouth daily. 30 tablet 3   hydrOXYzine (ATARAX) 25 MG tablet Take 1 tablet (25 mg total) by mouth at bedtime as needed for itching. 30 tablet 11   ipratropium-albuterol (DUONEB) 0.5-2.5 (3) MG/3ML SOLN Take 3 mLs by nebulization every 6 (six) hours as needed. (Patient taking differently: Take 3 mLs by nebulization every 6 (six) hours as needed (shortness of breathing or wheezing).) 360 mL 0   lidocaine (LIDODERM) 5 % Place 1 patch onto the skin daily. Remove & Discard patch within 12 hours or as directed by MD. Apply to low back 30 patch 0   lidocaine-prilocaine (EMLA) cream Apply 1 Application topically as needed. 30 g 0   losartan (COZAAR) 100 MG tablet Take 100 mg by mouth daily.     olmesartan (BENICAR) 40 MG tablet Take 40 mg by mouth daily.     ondansetron (ZOFRAN) 8 MG tablet Take 1 tablet (8 mg total) by mouth  every 8 (eight) hours as needed for nausea or vomiting. 30 tablet 1   polyethylene glycol (MIRALAX) 17 g packet Take 17 g by mouth daily. 30 each 2   potassium chloride SA (KLOR-CON M) 20 MEQ tablet TAKE 1 TABLET(20 MEQ) BY MOUTH DAILY 90 tablet 0   prochlorperazine (COMPAZINE) 10 MG tablet Take 1 tablet (10 mg total) by mouth every 6 (six) hours as needed for nausea or vomiting. 30 tablet 1   senna-docusate (STIMULANT LAXATIVE) 8.6-50 MG tablet Take 2 tablets by mouth 2 (two) times daily. May add additional tablet as needed 150 tablet 3   sodium chloride 1 g tablet Take 1 tablet (1 g total) by mouth 3 (three) times daily with meals. 90 tablet 3   thiamine 100 MG tablet Take 1 tablet (100 mg total) by mouth daily.     traMADol (ULTRAM) 50 MG tablet Take 1-2 tablets (50-100 mg total) by mouth every 6 (six) hours as needed. 120 tablet 0   No current facility-administered medications for this visit.    REVIEW OF SYSTEMS:   Constitutional: ( - ) fevers, ( - )  chills , ( - ) night sweats Eyes: ( - ) blurriness of vision, ( - ) double vision, ( - ) watery eyes Ears, nose, mouth, throat, and face: ( - ) mucositis, ( - ) sore throat Respiratory: ( - ) cough, ( - ) dyspnea, ( - ) wheezes Cardiovascular: ( - ) palpitation, ( - ) chest discomfort, ( - ) lower extremity swelling Gastrointestinal:  ( - ) nausea, ( - ) heartburn, ( - ) change in bowel habits Skin: ( - ) abnormal skin rashes Lymphatics: ( - ) new lymphadenopathy, ( - ) easy bruising Neurological: ( - ) numbness, ( - ) tingling, ( - ) new weaknesses Behavioral/Psych: ( - ) mood change, ( - ) new changes  All other systems were reviewed with the patient and are negative.  PHYSICAL EXAMINATION: ECOG PERFORMANCE STATUS: 1 - Symptomatic but completely ambulatory  Vitals:   02/26/23 1308  BP: 101/74  Pulse: (!) 118  Resp: 18  Temp: 97.8 F (36.6 C)  SpO2: 100%     Filed  Weights   02/26/23 1308  Weight: 151 lb 8 oz (68.7 kg)    GENERAL: Well-appearing elderly African-American male, alert, no distress and comfortable SKIN: skin color, texture, turgor are normal, no rashes or significant lesions EYES: conjunctiva are pink and non-injected, sclera clear. Stye noted in left lower eyelid without purulent discharge.  LUNGS: clear to auscultation and percussion with normal breathing effort HEART: regular rate & rhythm and no murmurs and no lower extremity edema Musculoskeletal: no cyanosis of digits and no clubbing  PSYCH: alert & oriented x 3, fluent speech NEURO: no focal motor/sensory deficits  LABORATORY DATA:  I have reviewed the data as listed    Latest Ref Rng & Units 02/16/2023    7:57 AM 01/26/2023    9:39 AM 01/12/2023   11:36 AM  CBC  WBC 4.0 - 10.5 K/uL 46.5  26.7  6.6   Hemoglobin 13.0 - 17.0 g/dL 14.7  82.9  9.5   Hematocrit 39.0 - 52.0 % 33.4  31.2  28.2   Platelets 150 - 400 K/uL 410  519  160        Latest Ref Rng & Units 02/16/2023    7:57 AM 01/26/2023    9:39 AM 01/12/2023   11:36 AM  CMP  Glucose 70 - 99 mg/dL 562  130  97   BUN 8 - 23 mg/dL 18  9  10    Creatinine 0.61 - 1.24 mg/dL 8.65  7.84  6.96   Sodium 135 - 145 mmol/L 130  132  132   Potassium 3.5 - 5.1 mmol/L 4.4  4.3  4.4   Chloride 98 - 111 mmol/L 99  98  98   CO2 22 - 32 mmol/L 24  26  29    Calcium 8.9 - 10.3 mg/dL 9.2  9.4  9.4   Total Protein 6.5 - 8.1 g/dL 7.0  7.1  6.6   Total Bilirubin 0.0 - 1.2 mg/dL 0.3  0.2  0.4   Alkaline Phos 38 - 126 U/L 90  82  91   AST 15 - 41 U/L 13  13  11    ALT 0 - 44 U/L 13  9  13      Lab Results  Component Value Date   MPROTEIN Not Observed 04/28/2021   Lab Results  Component Value Date   KPAFRELGTCHN 33.6 (H) 04/28/2021   LAMBDASER 22.0 04/28/2021   KAPLAMBRATIO 1.53 04/28/2021    RADIOGRAPHIC STUDIES: CT CHEST ABDOMEN PELVIS W CONTRAST Result Date: 02/16/2023 CLINICAL DATA:  Metastatic non-small-cell lung cancer of the left upper lobe on treatment. Assess treatment response.  * Tracking Code: BO * EXAM: CT CHEST, ABDOMEN, AND PELVIS WITH CONTRAST TECHNIQUE: Multidetector CT imaging of the chest, abdomen and pelvis was performed following the standard protocol during bolus administration of intravenous contrast. RADIATION DOSE REDUCTION: This exam was performed according to the departmental dose-optimization program which includes automated exposure control, adjustment of the mA and/or kV according to patient size and/or use of iterative reconstruction technique. CONTRAST:  OMNIPAQUE IOHEXOL 300 MG/ML  SOLN COMPARISON:  CT chest, abdomen, and pelvis dated 11/17/2022 FINDINGS: CT CHEST FINDINGS Cardiovascular: Right chest wall port terminates in the lower SVC. Normal heart size. No significant pericardial fluid/thickening. Great vessels are normal in course and caliber. 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 No central pulmonary emboli. Coronary artery calcifications. Mediastinum/Nodes: Imaged thyroid gland without nodules meeting criteria for imaging follow-up by size. Normal esophagus. No pathologically enlarged axillary, supraclavicular, mediastinal,  or hilar lymph nodes. Lungs/Pleura: The central airways are patent. Small volume layering secretions within the trachea extending into bilateral bronchi. Again seen are changes of centrilobular and paraseptal emphysema, most prominent in the upper lobes. Decreased size of scarring and posttreatment changes of the left upper lobe. Interval decrease in size of bilateral pulmonary nodules. Previously described index nodules as below: -6 mm medial right upper lobe (6:75), previously 10 mm -3 mm right lower lobe (6:77), previously 6 mm No new or enlarging pulmonary nodules. No pneumothorax. No pleural effusion. Musculoskeletal: Similar superior endplate T12 compression. Similar mixed lytic and sclerotic appearance of the left scapula. CT ABDOMEN PELVIS FINDINGS Hepatobiliary: Previously noted hypoattenuating focus  adjacent to the gallbladder fossa is no longer discretely measurable. No substantial change in additional scattered hypodensities throughout the liver, likely cysts. No new or enlarging focal hepatic lesions. No intra or extrahepatic biliary ductal dilation. Gallbladder is contracted. Pancreas: No focal lesions or main ductal dilation. Spleen: Normal in size without focal abnormality. Adrenals/Urinary Tract: No adrenal nodules. No substantial change in size of lateral right upper pole 1.3 cm mildly hypoattenuating focus (2:66). No substantial change in additional bilateral hypoattenuating foci, likely simple/minimally complicated cysts. No hydronephrosis or calculi. No focal bladder wall thickening. Stomach/Bowel: Normal appearance of the stomach. No evidence of bowel wall thickening, distention, or inflammatory changes. Colonic diverticulosis without acute diverticulitis. Normal appendix. Vascular/Lymphatic: Aortic atherosclerosis. No enlarged abdominal or pelvic lymph nodes. Reproductive: Prostate is unremarkable. Other: Trace mesenteric and presacral edema. No free air or fluid collection. Musculoskeletal: Similar mixed lytic and sclerotic appearance of the right right-greater-than-left iliac bones and left sacral ala. Small subcutaneous soft tissue stranding overlying the bilateral greater trochanters, right-greater-than-left, increased in conspicuity. IMPRESSION: 1. Marked interval improvement in previously noted metastatic disease in the lungs and liver. Interval decrease in size of bilateral pulmonary nodules and left upper lobe scarring and posttreatment changes. No new or enlarging pulmonary nodules. Previously noted hepatic lesion adjacent to the gallbladder fossa is no longer discretely measurable. 2. Unchanged size of lateral right upper pole renal lesion. 3. Similar mixed lytic and sclerotic appearance of the left scapula, right-greater-than-left iliac bones, and left sacral ala. 4. Small subcutaneous  soft tissue stranding overlying the bilateral greater trochanters, right-greater-than-left, increased in conspicuity, which may reflect soft tissue contusions. 5. Aortic Atherosclerosis (ICD10-I70.0) and Emphysema (ICD10-J43.9). Coronary artery calcifications. Assessment for potential risk factor modification, dietary therapy or pharmacologic therapy may be warranted, if clinically indicated. Electronically Signed   By: Agustin Cree M.D.   On: 02/16/2023 09:45    ASSESSMENT & PLAN Terry Page. Is a 68 y.o.  male with medical history significant for metastatic adenocarcinoma of the lung who presents for a symptom management visit.   #Left lower eyelid stye: --Recommended warm compresses --Sent 7 day course of doxycycline 100 mg BID.   # Metastatic Adenocarcinoma of the Lung  -- NGS testing from this patient shows no evidence of targetable mutation. --MRI brain shows no evidence of intracranial metastases --Started carboplatin, pembrolizumab, and pemetrexed as treatment for his cancer on 07/08/2021. --Received palliative radiation to osseous metastasis of the left shoulder from 08/11/2021-08/24/2021. Planned dose is 30 Gy in 10 fx.  --Discontinued Pemetrexed on 03/17/2022 due to persistent anemia and kidney dysfunction. --Restaging CT CAP from 08/09/2022 shows interval enlargement of multiple mixed lytic and sclerotic bone metastases involving the right pelvis. Other bone metastases including of the left scapula, sacrum, and left hemipelvis are unchanged. Unchanged treated central LUL mass. Recommend  to continue on pembrolizumab therapy and arrange for palliative radiation to enlarging bone metastases.  --Due for cycle 5, day 1 on 03/09/2023.   No orders of the defined types were placed in this encounter.   All questions were answered. The patient knows to call the clinic with any problems, questions or concerns.  I have spent a total of 25 minutes minutes of face-to-face and non-face-to-face time,  preparing to see the patient, performing a medically appropriate examination, counseling and educating the patient, referring and communicating with other health care professionals, documenting clinical information in the electronic health record, and care coordination.   Georga Kaufmann PA-C Dept of Hematology and Oncology [facility] Phone: [phone]   02/26/2023 7:30 PM

## 2023-02-26 NOTE — Telephone Encounter (Signed)
 Received call from pt requesting an appt for today.  He states his left lower eyelid, near his eyelashes has started bleeding. He also states it is very tender to the touch. Arranged for him to be seen by Georga Kaufmann, PA @ 1 pm Transportation has been notified. Pt aware.

## 2023-03-02 ENCOUNTER — Other Ambulatory Visit: Payer: Self-pay

## 2023-03-02 DIAGNOSIS — G893 Neoplasm related pain (acute) (chronic): Secondary | ICD-10-CM

## 2023-03-02 DIAGNOSIS — C7951 Secondary malignant neoplasm of bone: Secondary | ICD-10-CM

## 2023-03-02 DIAGNOSIS — Z515 Encounter for palliative care: Secondary | ICD-10-CM

## 2023-03-02 DIAGNOSIS — C3412 Malignant neoplasm of upper lobe, left bronchus or lung: Secondary | ICD-10-CM

## 2023-03-02 NOTE — Telephone Encounter (Signed)
 Pt called for medication refill, see associated orders

## 2023-03-03 ENCOUNTER — Encounter: Payer: Self-pay | Admitting: Hematology and Oncology

## 2023-03-03 MED ORDER — TRAMADOL HCL 50 MG PO TABS: 50.0000 mg | ORAL_TABLET | Freq: Four times a day (QID) | ORAL | 0 refills | Status: DC | PRN

## 2023-03-09 ENCOUNTER — Inpatient Hospital Stay: Payer: Medicare HMO | Attending: Hematology and Oncology

## 2023-03-09 ENCOUNTER — Other Ambulatory Visit: Payer: Self-pay | Admitting: *Deleted

## 2023-03-09 ENCOUNTER — Inpatient Hospital Stay: Payer: Medicare HMO | Admitting: Physician Assistant

## 2023-03-09 ENCOUNTER — Inpatient Hospital Stay: Payer: Medicare HMO

## 2023-03-09 VITALS — BP 135/82 | HR 93 | Resp 18

## 2023-03-09 VITALS — BP 155/89 | HR 96 | Temp 96.0°F | Resp 16 | Wt 153.1 lb

## 2023-03-09 DIAGNOSIS — Z79891 Long term (current) use of opiate analgesic: Secondary | ICD-10-CM | POA: Diagnosis not present

## 2023-03-09 DIAGNOSIS — D649 Anemia, unspecified: Secondary | ICD-10-CM

## 2023-03-09 DIAGNOSIS — C3412 Malignant neoplasm of upper lobe, left bronchus or lung: Secondary | ICD-10-CM | POA: Insufficient documentation

## 2023-03-09 DIAGNOSIS — D6481 Anemia due to antineoplastic chemotherapy: Secondary | ICD-10-CM | POA: Diagnosis not present

## 2023-03-09 DIAGNOSIS — Z5112 Encounter for antineoplastic immunotherapy: Secondary | ICD-10-CM | POA: Insufficient documentation

## 2023-03-09 DIAGNOSIS — Z5111 Encounter for antineoplastic chemotherapy: Secondary | ICD-10-CM | POA: Insufficient documentation

## 2023-03-09 DIAGNOSIS — Z79899 Other long term (current) drug therapy: Secondary | ICD-10-CM | POA: Insufficient documentation

## 2023-03-09 DIAGNOSIS — E871 Hypo-osmolality and hyponatremia: Secondary | ICD-10-CM | POA: Diagnosis not present

## 2023-03-09 DIAGNOSIS — C61 Malignant neoplasm of prostate: Secondary | ICD-10-CM | POA: Diagnosis not present

## 2023-03-09 DIAGNOSIS — T451X5D Adverse effect of antineoplastic and immunosuppressive drugs, subsequent encounter: Secondary | ICD-10-CM | POA: Insufficient documentation

## 2023-03-09 DIAGNOSIS — I10 Essential (primary) hypertension: Secondary | ICD-10-CM | POA: Insufficient documentation

## 2023-03-09 DIAGNOSIS — G893 Neoplasm related pain (acute) (chronic): Secondary | ICD-10-CM | POA: Diagnosis not present

## 2023-03-09 DIAGNOSIS — C3490 Malignant neoplasm of unspecified part of unspecified bronchus or lung: Secondary | ICD-10-CM | POA: Diagnosis not present

## 2023-03-09 DIAGNOSIS — C7951 Secondary malignant neoplasm of bone: Secondary | ICD-10-CM | POA: Insufficient documentation

## 2023-03-09 DIAGNOSIS — D72829 Elevated white blood cell count, unspecified: Secondary | ICD-10-CM | POA: Diagnosis not present

## 2023-03-09 DIAGNOSIS — Z923 Personal history of irradiation: Secondary | ICD-10-CM | POA: Insufficient documentation

## 2023-03-09 DIAGNOSIS — Z95828 Presence of other vascular implants and grafts: Secondary | ICD-10-CM

## 2023-03-09 LAB — CBC WITH DIFFERENTIAL (CANCER CENTER ONLY)
Abs Immature Granulocytes: 0.2 10*3/uL — ABNORMAL HIGH (ref 0.00–0.07)
Basophils Absolute: 0 10*3/uL (ref 0.0–0.1)
Basophils Relative: 0 %
Eosinophils Absolute: 0.1 10*3/uL (ref 0.0–0.5)
Eosinophils Relative: 1 %
HCT: 29.9 % — ABNORMAL LOW (ref 39.0–52.0)
Hemoglobin: 9.9 g/dL — ABNORMAL LOW (ref 13.0–17.0)
Immature Granulocytes: 1 %
Lymphocytes Relative: 5 %
Lymphs Abs: 0.8 10*3/uL (ref 0.7–4.0)
MCH: 29.1 pg (ref 26.0–34.0)
MCHC: 33.1 g/dL (ref 30.0–36.0)
MCV: 87.9 fL (ref 80.0–100.0)
Monocytes Absolute: 0.9 10*3/uL (ref 0.1–1.0)
Monocytes Relative: 6 %
Neutro Abs: 15.1 10*3/uL — ABNORMAL HIGH (ref 1.7–7.7)
Neutrophils Relative %: 87 %
Platelet Count: 402 10*3/uL — ABNORMAL HIGH (ref 150–400)
RBC: 3.4 MIL/uL — ABNORMAL LOW (ref 4.22–5.81)
RDW: 18.1 % — ABNORMAL HIGH (ref 11.5–15.5)
Smear Review: NORMAL
WBC Count: 17.2 10*3/uL — ABNORMAL HIGH (ref 4.0–10.5)
nRBC: 0 % (ref 0.0–0.2)

## 2023-03-09 LAB — CMP (CANCER CENTER ONLY)
ALT: 11 U/L (ref 0–44)
AST: 13 U/L — ABNORMAL LOW (ref 15–41)
Albumin: 3.6 g/dL (ref 3.5–5.0)
Alkaline Phosphatase: 70 U/L (ref 38–126)
Anion gap: 5 (ref 5–15)
BUN: 15 mg/dL (ref 8–23)
CO2: 26 mmol/L (ref 22–32)
Calcium: 9.1 mg/dL (ref 8.9–10.3)
Chloride: 102 mmol/L (ref 98–111)
Creatinine: 0.79 mg/dL (ref 0.61–1.24)
GFR, Estimated: >60 mL/min (ref >60)
Glucose, Bld: 122 mg/dL — ABNORMAL HIGH (ref 70–99)
Potassium: 4.6 mmol/L (ref 3.5–5.1)
Sodium: 133 mmol/L — ABNORMAL LOW (ref 135–145)
Total Bilirubin: 0.2 mg/dL (ref 0.0–1.2)
Total Protein: 6.9 g/dL (ref 6.5–8.1)

## 2023-03-09 LAB — SAMPLE TO BLOOD BANK

## 2023-03-09 MED ORDER — ACETAMINOPHEN 325 MG PO TABS
650.0000 mg | ORAL_TABLET | Freq: Once | ORAL | Status: AC
Start: 2023-03-09 — End: 2023-03-09
  Administered 2023-03-09: 650 mg via ORAL
  Filled 2023-03-09: qty 2

## 2023-03-09 MED ORDER — SODIUM CHLORIDE 0.9 % IV SOLN
75.0000 mg/m2 | Freq: Once | INTRAVENOUS | Status: AC
  Administered 2023-03-09: 136 mg via INTRAVENOUS
  Filled 2023-03-09: qty 13.6

## 2023-03-09 MED ORDER — HEPARIN SOD (PORK) LOCK FLUSH 100 UNIT/ML IV SOLN
500.0000 [IU] | Freq: Once | INTRAVENOUS | Status: AC | PRN
  Administered 2023-03-09: 500 [IU]

## 2023-03-09 MED ORDER — DIPHENHYDRAMINE HCL 50 MG/ML IJ SOLN
50.0000 mg | Freq: Once | INTRAMUSCULAR | Status: AC
  Administered 2023-03-09: 50 mg via INTRAVENOUS
  Filled 2023-03-09: qty 1

## 2023-03-09 MED ORDER — DEXAMETHASONE SODIUM PHOSPHATE 10 MG/ML IJ SOLN
10.0000 mg | Freq: Once | INTRAMUSCULAR | Status: AC
  Administered 2023-03-09: 10 mg via INTRAVENOUS
  Filled 2023-03-09: qty 1

## 2023-03-09 MED ORDER — SODIUM CHLORIDE 0.9 % IV SOLN
INTRAVENOUS | Status: DC
Start: 2023-03-09 — End: 2023-03-09

## 2023-03-09 MED ORDER — FOLIC ACID 1 MG PO TABS: 1.0000 mg | ORAL_TABLET | Freq: Every day | ORAL | 3 refills | Status: AC

## 2023-03-09 MED ORDER — SODIUM CHLORIDE 0.9 % IV SOLN
10.0000 mg/kg | Freq: Once | INTRAVENOUS | Status: AC
  Administered 2023-03-09: 700 mg via INTRAVENOUS
  Filled 2023-03-09: qty 20

## 2023-03-09 MED ORDER — SODIUM CHLORIDE 0.9% FLUSH
10.0000 mL | Freq: Once | INTRAVENOUS | Status: AC
  Administered 2023-03-09: 10 mL

## 2023-03-09 MED ORDER — SODIUM CHLORIDE 0.9% FLUSH
10.0000 mL | INTRAVENOUS | Status: DC | PRN
  Administered 2023-03-09: 10 mL

## 2023-03-09 NOTE — Progress Notes (Signed)
 [facility] [phone]   [phone]  PROGRESS NOTE  Patient Care Team: Terry Beams, MD as PCP - General (Family Medicine)  Hematological/Oncological History # Metastatic Adenocarcinoma of the Lung  01/07/2020 : CT of the chest with contrast performed which showed interval enlargement of a spiculated nodule in the central left upper lobe measuring 1.2 x 1.0 cm and highly suspicious for primary lung malignancy 02/06/2020 :PET scan performed which showed a hypermetabolic irregular solid 1.3 cm left upper lobe pulmonary nodule compatible with malignancy without any other hypermetabolic lesions 06/04/9526- 04/19/2020:  SBRT to the lung lesion 04/28/2021: CT C/A/P showed multifocal lytic bone metastases are identified. Lesion within the spine of the left scapula and lesions involving bilateral iliac bones and left sacral wing. Establish care with Dr. Leonides Page  05/03/2021: biopsy of right iliac lytic lesion showed metastatic carcinoma, consistent with lung primary.  07/08/2021: Cycle 1 Day 1 of Carbo/Pem/Pem 07/29/2021: Cycle 2 Day 1 of Carbo/Pem/Pem 08/11/2021-08/24/2021: Received palliative radiation to osseous metastasis in the left shoulder. 30 Gy in 10 fractions.  08/19/2021: Cycle 3 Day 1 of Carbo/Pem/Pem 09/09/2021: Cycle 4 Day 1 of Carbo/Pem/Pem 09/23/2021: CT CAP: stable disease 09/30/2021: Cycle 5 Day 1 of Pem/Pem 10/21/2021: Cycle 6 Day 1 of Pembrolizumab. Held pemetrexed due to anemia. 11/16/2021: Cycle 7 Day 1 of Pem/Pem.   12/16/2021: Cycle 8 Day 1 of Pem/Pem.   01/06/2022: Cycle 9 Day 1 of Pem/Pem.  01/27/2022: Cycle 10 Day 1 of Pem/Pem 02/17/2022: Cycle 11 Day 1 of Pem/Pem 03/10/2022: Cycle 12 Day 1 of Pem/Pem (HELD due to anemia with Hgb of 6.4) 03/17/2022: Cycle 12 Day 1 of Pem/Pem (HELD pemextrexed due to cytopenias/kidney function) 04/10/2022: Cycle 13 Day 1 of Pem/Pem (HELD pemextrexed due to cytopenias/kidney function) 04/28/2022: Cycle 14 Day 1 of Pem/Pem  (HELD pemextrexed due to cytopenias/kidney function) 05/23/2022:  Cycle 15 Day 1 of Pem/Pem (HELD pemextrexed due to cytopenias/kidney function) 06/15/2022: Cycle 16 Day 1 of Pembrolizumab.  07/07/2022: Cycle 17 Day 1 of Pembrolizumab.  07/28/2022: Cycle 18 Day 1 of Pembrolizumab 08/18/2022: Cycle 19 Day 1 of Pembrolizumab HELD due to hyponatremia of 118, new bone metastases. 08/25/2022: Cycle 19 Day 1 of Pembrolizumab 08/28/2022-09/13/2022: Received palliative radiation to right pelvis with total dose of 30 Gy in 10 Fx.  09/22/2022:  Cycle 20 Day 1 of Pembrolizumab 10/13/2022:  Cycle 21 Day 1 of Pembrolizumab 12/11/2022: Cycle 1 Day 1 of Docetaxel/Ramucircumab.  01/05/2023: Cycle 2 Day 1 of Docetaxel/Ramucircumab.  01/26/2023: Cycle 3 Day 1 of Docetaxel/Ramucircumab 02/16/2023:  Cycle 4 Day 1 of Docetaxel/Ramucircumab 03/09/2023: Cycle 5 Day 1 of Docetaxel/Ramucircumab  #Adenocarcinoma of the Prostate, T1cN0M0 08/19/2019-10/07/2019: 70 Gy in 28 fractions of 2.5 Gy.  Radiation to the prostate was under the care of Dr. Margaretmary Page  Interval History:  Terry Page. 68 y.o. male with medical history significant for metastatic adenocarcinoma of the lung who presents for a follow up visit. The patient's last visit was on 02/16/2023. In the interim since the last visit he completed Cycle 4 of Docetaxel/Ramucircumab.   On exam today, Terry Page states he is doing well without any new symptoms. The stye involving the right lower eyelid has near resolved after completion of antibiotic therapy and applying warm compresses. He reports energy and appetite are stable. He denies nausea, vomiting or bowel habit changes. He continues have nail discoloration but no tenderness.  He reports his hip pain is well-controlled with tramadol that he takes as needed.  His weight  is stable. He denies fevers, chills,sweats, shortness of breath, chest pain or cough.  He has no other complaints. A full 10 point ROS is listed  below.  MEDICAL HISTORY:  Past Medical History:  Diagnosis Date   Alcohol abuse    Asthma    Bullous emphysema ([name])    Essential hypertension 04/10/2020   Hemorrhoids    History of radiation therapy 04/08/20-04/19/20   IMRT- Left lung- Dr. Antony Page   Incidental lung nodule, greater than or equal to 8mm 10/22/2017   Left upper lobe - discovered on CTA   Prostate cancer ([name])    Spontaneous pneumothorax 10/20/2017   right   Tobacco abuse     SURGICAL HISTORY: Past Surgical History:  Procedure Laterality Date   BACK SURGERY     IR IMAGING GUIDED PORT INSERTION  06/29/2021   PROSTATE BIOPSY      SOCIAL HISTORY: Social History   Socioeconomic History   Marital status: Single    Spouse name: Not on file   Number of children: Not on file   Years of education: Not on file   Highest education level: Not on file  Occupational History   Occupation: retired  Tobacco Use   Smoking status: Former    Current packs/day: 0.00    Types: Cigarettes    Quit date: 11/22/2019    Years since quitting: 3.2   Smokeless tobacco: Never   Tobacco comments:    Patient reports quit 3 years ago. 06/25/20. [facility]  Vaping Use   Vaping status: Never Used  Substance and Sexual Activity   Alcohol use: Yes    Alcohol/week: 12.0 standard drinks of alcohol    Types: 12 Cans of beer per week    Comment: daily sometimes   Drug use: No   Sexual activity: Not Currently  Other Topics Concern   Not on file  Social History Narrative   Not on file   Social Drivers of Health   Financial Resource Strain: Not on file  Food Insecurity: Not on file  Transportation Needs: Unmet Transportation Needs (08/18/2022)   PRAPARE - Administrator, Civil Service (Medical): Yes    Lack of Transportation (Non-Medical): No  Physical Activity: Not on file  Stress: Not on file  Social Connections: Not on file  Intimate Partner Violence: Not on file    FAMILY HISTORY: Family History  Problem  Relation Age of Onset   Cancer Cousin        maternal cousin   Cancer Cousin        paternal cousin   Cancer Cousin    Colon polyps Neg Hx    Pancreatic disease Neg Hx    Pancreatic cancer Neg Hx    Breast cancer Neg Hx    Colon cancer Neg Hx     ALLERGIES:  is allergic to morphine and oxycodone.  MEDICATIONS:  Current Outpatient Medications  Medication Sig Dispense Refill   acetaminophen (TYLENOL) 325 MG tablet Take 2 tablets (650 mg total) by mouth every 6 (six) hours as needed for mild pain or headache (fever >/= 101).     amLODipine (NORVASC) 10 MG tablet Take 1 tablet by mouth daily.     atorvastatin (LIPITOR) 20 MG tablet Take 20 mg by mouth daily.     cyanocobalamin (VITAMIN B12) 1000 MCG tablet Take 1 tablet (1,000 mcg total) by mouth daily. 90 tablet 1   dexamethasone (DECADRON) 4 MG tablet Take 2 tabs by mouth 2 times daily  starting day before chemo. Then take 2 tabs daily for 2 days starting day after chemo. Take with food. 30 tablet 1   doxycycline (VIBRA-TABS) 100 MG tablet Take 1 tablet (100 mg total) by mouth 2 (two) times daily. 14 tablet 0   dronabinol (MARINOL) 5 MG capsule Take 1 capsule (5 mg total) by mouth 2 (two) times daily before lunch and supper. 60 capsule 0   ferrous sulfate 325 (65 FE) MG EC tablet Take 1 tablet (325 mg total) by mouth daily with breakfast. 30 tablet 3   folic acid (FOLVITE) 1 MG tablet Take 1 tablet (1 mg total) by mouth daily. 90 tablet 3   hydrOXYzine (ATARAX) 25 MG tablet Take 1 tablet (25 mg total) by mouth at bedtime as needed for itching. 30 tablet 11   ipratropium-albuterol (DUONEB) 0.5-2.5 (3) MG/3ML SOLN Take 3 mLs by nebulization every 6 (six) hours as needed. (Patient taking differently: Take 3 mLs by nebulization every 6 (six) hours as needed (shortness of breathing or wheezing).) 360 mL 0   lidocaine (LIDODERM) 5 % Place 1 patch onto the skin daily. Remove & Discard patch within 12 hours or as directed by MD. Apply to low back  30 patch 0   lidocaine-prilocaine (EMLA) cream Apply 1 Application topically as needed. 30 g 0   losartan (COZAAR) 100 MG tablet Take 100 mg by mouth daily.     olmesartan (BENICAR) 40 MG tablet Take 40 mg by mouth daily.     ondansetron (ZOFRAN) 8 MG tablet Take 1 tablet (8 mg total) by mouth every 8 (eight) hours as needed for nausea or vomiting. 30 tablet 1   polyethylene glycol (MIRALAX) 17 g packet Take 17 g by mouth daily. 30 each 2   potassium chloride SA (KLOR-CON M) 20 MEQ tablet TAKE 1 TABLET(20 MEQ) BY MOUTH DAILY 90 tablet 0   prochlorperazine (COMPAZINE) 10 MG tablet Take 1 tablet (10 mg total) by mouth every 6 (six) hours as needed for nausea or vomiting. 30 tablet 1   senna-docusate (STIMULANT LAXATIVE) 8.6-50 MG tablet Take 2 tablets by mouth 2 (two) times daily. May add additional tablet as needed 150 tablet 3   sodium chloride 1 g tablet Take 1 tablet (1 g total) by mouth 3 (three) times daily with meals. 90 tablet 3   thiamine 100 MG tablet Take 1 tablet (100 mg total) by mouth daily.     traMADol (ULTRAM) 50 MG tablet Take 1-2 tablets (50-100 mg total) by mouth every 6 (six) hours as needed. 120 tablet 0   No current facility-administered medications for this visit.    REVIEW OF SYSTEMS:   Constitutional: ( - ) fevers, ( - )  chills , ( - ) night sweats Eyes: ( - ) blurriness of vision, ( - ) double vision, ( - ) watery eyes Ears, nose, mouth, throat, and face: ( - ) mucositis, ( - ) sore throat Respiratory: ( - ) cough, ( - ) dyspnea, ( - ) wheezes Cardiovascular: ( - ) palpitation, ( - ) chest discomfort, ( - ) lower extremity swelling Gastrointestinal:  ( - ) nausea, ( - ) heartburn, ( - ) change in bowel habits Skin: ( - ) abnormal skin rashes Lymphatics: ( - ) new lymphadenopathy, ( - ) easy bruising Neurological: ( - ) numbness, ( - ) tingling, ( - ) new weaknesses Behavioral/Psych: ( - ) mood change, ( - ) new changes  All other systems were reviewed  with the  patient and are negative.  PHYSICAL EXAMINATION: ECOG PERFORMANCE STATUS: 1 - Symptomatic but completely ambulatory  Vitals:   03/09/23 1058  BP: (!) 155/89  Pulse: 96  Resp: 16  Temp: (!) 96 F (35.6 C)  SpO2: 98%     Filed Weights   03/09/23 1058  Weight: 153 lb 1.6 oz (69.4 kg)   GENERAL: Well-appearing elderly African-American male, alert, no distress and comfortable SKIN: skin color, texture, turgor are normal, no rashes or significant lesions EYES: conjunctiva are pink and non-injected, sclera clear LUNGS: clear to auscultation and percussion with normal breathing effort HEART: regular rate & rhythm and no murmurs and no lower extremity edema Musculoskeletal: no cyanosis of digits and no clubbing  PSYCH: alert & oriented x 3, fluent speech NEURO: no focal motor/sensory deficits  LABORATORY DATA:  I have reviewed the data as listed    Latest Ref Rng & Units 03/09/2023   10:07 AM 02/16/2023    7:57 AM 01/26/2023    9:39 AM  CBC  WBC 4.0 - 10.5 K/uL 17.2  46.5  26.7   Hemoglobin 13.0 - 17.0 g/dL 9.9  16.1  09.6   Hematocrit 39.0 - 52.0 % 29.9  33.4  31.2   Platelets 150 - 400 K/uL 402  410  519        Latest Ref Rng & Units 03/09/2023   10:07 AM 02/16/2023    7:57 AM 01/26/2023    9:39 AM  CMP  Glucose 70 - 99 mg/dL 045  409  811   BUN 8 - 23 mg/dL 15  18  9    Creatinine 0.61 - 1.24 mg/dL 9.14  7.82  9.56   Sodium 135 - 145 mmol/L 133  130  132   Potassium 3.5 - 5.1 mmol/L 4.6  4.4  4.3   Chloride 98 - 111 mmol/L 102  99  98   CO2 22 - 32 mmol/L 26  24  26    Calcium 8.9 - 10.3 mg/dL 9.1  9.2  9.4   Total Protein 6.5 - 8.1 g/dL 6.9  7.0  7.1   Total Bilirubin 0.0 - 1.2 mg/dL 0.2  0.3  0.2   Alkaline Phos 38 - 126 U/L 70  90  82   AST 15 - 41 U/L 13  13  13    ALT 0 - 44 U/L 11  13  9      Lab Results  Component Value Date   MPROTEIN Not Observed 04/28/2021   Lab Results  Component Value Date   KPAFRELGTCHN 33.6 (H) 04/28/2021   LAMBDASER 22.0 04/28/2021    KAPLAMBRATIO 1.53 04/28/2021    RADIOGRAPHIC STUDIES: CT CHEST ABDOMEN PELVIS W CONTRAST Result Date: 02/16/2023 CLINICAL DATA:  Metastatic non-small-cell lung cancer of the left upper lobe on treatment. Assess treatment response. * Tracking Code: BO * EXAM: CT CHEST, ABDOMEN, AND PELVIS WITH CONTRAST TECHNIQUE: Multidetector CT imaging of the chest, abdomen and pelvis was performed following the standard protocol during bolus administration of intravenous contrast. RADIATION DOSE REDUCTION: This exam was performed according to the departmental dose-optimization program which includes automated exposure control, adjustment of the mA and/or kV according to patient size and/or use of iterative reconstruction technique. CONTRAST:  OMNIPAQUE IOHEXOL 300 MG/ML  SOLN COMPARISON:  CT chest, abdomen, and pelvis dated 11/17/2022 FINDINGS: CT CHEST FINDINGS Cardiovascular: Right chest wall port terminates in the lower SVC. Normal heart size. No significant pericardial fluid/thickening. Great vessels are normal in course  and caliber. 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 No central pulmonary emboli. Coronary artery calcifications. Mediastinum/Nodes: Imaged thyroid gland without nodules meeting criteria for imaging follow-up by size. Normal esophagus. No pathologically enlarged axillary, supraclavicular, mediastinal, or hilar lymph nodes. Lungs/Pleura: The central airways are patent. Small volume layering secretions within the trachea extending into bilateral bronchi. Again seen are changes of centrilobular and paraseptal emphysema, most prominent in the upper lobes. Decreased size of scarring and posttreatment changes of the left upper lobe. Interval decrease in size of bilateral pulmonary nodules. Previously described index nodules as below: -6 mm medial right upper lobe (6:75), previously 10 mm -3 mm right lower lobe (6:77), previously 6 mm No new or enlarging pulmonary nodules. No  pneumothorax. No pleural effusion. Musculoskeletal: Similar superior endplate T12 compression. Similar mixed lytic and sclerotic appearance of the left scapula. CT ABDOMEN PELVIS FINDINGS Hepatobiliary: Previously noted hypoattenuating focus adjacent to the gallbladder fossa is no longer discretely measurable. No substantial change in additional scattered hypodensities throughout the liver, likely cysts. No new or enlarging focal hepatic lesions. No intra or extrahepatic biliary ductal dilation. Gallbladder is contracted. Pancreas: No focal lesions or main ductal dilation. Spleen: Normal in size without focal abnormality. Adrenals/Urinary Tract: No adrenal nodules. No substantial change in size of lateral right upper pole 1.3 cm mildly hypoattenuating focus (2:66). No substantial change in additional bilateral hypoattenuating foci, likely simple/minimally complicated cysts. No hydronephrosis or calculi. No focal bladder wall thickening. Stomach/Bowel: Normal appearance of the stomach. No evidence of bowel wall thickening, distention, or inflammatory changes. Colonic diverticulosis without acute diverticulitis. Normal appendix. Vascular/Lymphatic: Aortic atherosclerosis. No enlarged abdominal or pelvic lymph nodes. Reproductive: Prostate is unremarkable. Other: Trace mesenteric and presacral edema. No free air or fluid collection. Musculoskeletal: Similar mixed lytic and sclerotic appearance of the right right-greater-than-left iliac bones and left sacral ala. Small subcutaneous soft tissue stranding overlying the bilateral greater trochanters, right-greater-than-left, increased in conspicuity. IMPRESSION: 1. Marked interval improvement in previously noted metastatic disease in the lungs and liver. Interval decrease in size of bilateral pulmonary nodules and left upper lobe scarring and posttreatment changes. No new or enlarging pulmonary nodules. Previously noted hepatic lesion adjacent to the gallbladder fossa is  no longer discretely measurable. 2. Unchanged size of lateral right upper pole renal lesion. 3. Similar mixed lytic and sclerotic appearance of the left scapula, right-greater-than-left iliac bones, and left sacral ala. 4. Small subcutaneous soft tissue stranding overlying the bilateral greater trochanters, right-greater-than-left, increased in conspicuity, which may reflect soft tissue contusions. 5. Aortic Atherosclerosis (ICD10-I70.0) and Emphysema (ICD10-J43.9). Coronary artery calcifications. Assessment for potential risk factor modification, dietary therapy or pharmacologic therapy may be warranted, if clinically indicated. Electronically Signed   By: Agustin Cree M.D.   On: 02/16/2023 09:45    ASSESSMENT & PLAN Terry Page. Is a 68 y.o.  male with medical history significant for metastatic adenocarcinoma of the lung who presents for a follow up visit.  # Metastatic Adenocarcinoma of the Lung  -- NGS testing from this patient shows no evidence of targetable mutation. --MRI brain shows no evidence of intracranial metastases --Started carboplatin, pembrolizumab, and pemetrexed as treatment for his cancer on 07/08/2021. --Received palliative radiation to osseous metastasis of the left shoulder from 08/11/2021-08/24/2021. Planned dose is 30 Gy in 10 fx.  --Discontinued Pemetrexed on 03/17/2022 due to persistent anemia and kidney dysfunction. --Restaging CT CAP from 08/09/2022 shows interval enlargement of multiple mixed lytic and sclerotic bone metastases involving the right pelvis.  Other bone metastases including of the left scapula, sacrum, and left hemipelvis are unchanged. Unchanged treated central LUL mass. Recommend to continue on pembrolizumab therapy and arrange for palliative radiation to enlarging bone metastases.  Plan: --today is Cycle 5 Day 1 of Docetaxel/Ramucirumab.  --labs from today were reviewed. Labs show white blood cell count 17.2 hemoglobin 9.9, MCV 87.9, platelets 402 Creatinine  and LFTs normal.  --Most recent CT scan from 02/15/2023 showed treatment response. Next one will be due around May 2025.  --RTC in 3 week with labs and follow up visit before Cycle 6.   #Leukocytosis-neutrophil predominant: --Patient denies any infectious symptoms, elevation is likely secondary to growth factor --Held GCSF injection with Cycle 4 and no evidence of neutropenia with today's labs. Continue to hold injection for this cycle as well.   #Left lower eyelid stye--nearly resolved: --Applying warm compresses, recommend to continue --Completed 7 day course of doxycycline 100 mg BID.   #Left sided low back pain with sciatica: --History of lumbar spondylosis and moderate to severe facet arthrosis without spinal stenosis and mild right neural foraminal stenosis at L4-5 seen on MRI lumbar 04/24/2020.  --Will make referral to Neurosurgery to evaluate for steroid injections  #Nail toxicities: --Patient has discoloration of nail bed with some tenderness --Advised to monitor for infection and do warm salt water soaks 4x/day  #Right hip pain-stable --Secondary progressive bone metastases in right pelvis seen on CT imaging from 08/09/2022 --Received palliative radiation to the right pelvis from 08/28/2022-09/13/2022 with total dose of 30 Gy in 10 Fx.  --Pain is slightly improved but still present.  --Continue to take tramadol q 6 hours for pain control.   #Hyponatremia-improved --Chronic in nature --Sodium level dropped from 127 on 08/09/2022 to 118.  Today sodium stable to 130.  --Etiologies including SIADH, immunotherapy induced, too much water intake.  --Patient is asymptomatic without nausea/vomiting, headaches, confusion, seizures, etc --Currently on salt tablets 2 gm PO daily. We advised to titrate to twice daily x 7 days and then three times daily.  --Strict ED precautions given for any new symptoms as discussed above.   # Iron Deficiency Anemia #Chemotherapy induced anemia --Hgb level  9.9, stable today.  --Currently on ferrous sulfate 325 mg PO daily  #Pain Control --stopped oxycodone due to itching --currently following with Palliative care.  --pain well controlled with tramadol as needed. Continue to take tramdol 50-100 mg q 6 hours as needed. --Continue senna docusate and miralax for constipation prophylaxis --Continue to monitor  #Supportive Care -- chemotherapy education complete -- port placed -- zofran 8mg  q8H PRN and compazine 10mg  PO q6H for nausea -- EMLA cream for port -- Patient referred to palliative care for pain control. Pain control as above.    No orders of the defined types were placed in this encounter.   All questions were answered. The patient knows to call the clinic with any problems, questions or concerns.  I have spent a total of 30 minutes minutes of face-to-face and non-face-to-face time, preparing to see the patient, performing a medically appropriate examination, counseling and educating the patient, referring and communicating with other health care professionals, documenting clinical information in the electronic health record, and care coordination.   Georga Kaufmann PA-C Dept of Hematology and Oncology [facility] at [facility] Phone: [phone]   03/09/2023 11:21 AM

## 2023-03-09 NOTE — Patient Instructions (Signed)
 [facility] - A DEPT OF [facility]  Discharge Instructions: Thank you for choosing [facility] to provide your oncology and hematology care.   If you have a lab appointment with the Cancer Center, please go directly to the Cancer Center and check in at the registration area.   Wear comfortable clothing and clothing appropriate for easy access to any Portacath or PICC line.   We strive to give you quality time with your provider. You may need to reschedule your appointment if you arrive late (15 or more minutes).  Arriving late affects you and other patients whose appointments are after yours.  Also, if you miss three or more appointments without notifying the office, you may be dismissed from the clinic at the provider's discretion.      For prescription refill requests, have your pharmacy contact our office and allow 72 hours for refills to be completed.    Today you received the following chemotherapy and/or immunotherapy agents: Cyramza, Taxotere      To help prevent nausea and vomiting after your treatment, we encourage you to take your nausea medication as directed.  BELOW ARE SYMPTOMS THAT SHOULD BE REPORTED IMMEDIATELY: *FEVER GREATER THAN 100.4 F (38 C) OR HIGHER *CHILLS OR SWEATING *NAUSEA AND VOMITING THAT IS NOT CONTROLLED WITH YOUR NAUSEA MEDICATION *UNUSUAL SHORTNESS OF BREATH *UNUSUAL BRUISING OR BLEEDING *URINARY PROBLEMS (pain or burning when urinating, or frequent urination) *BOWEL PROBLEMS (unusual diarrhea, constipation, pain near the anus) TENDERNESS IN MOUTH AND THROAT WITH OR WITHOUT PRESENCE OF ULCERS (sore throat, sores in mouth, or a toothache) UNUSUAL RASH, SWELLING OR PAIN  UNUSUAL VAGINAL DISCHARGE OR ITCHING   Items with * indicate a potential emergency and should be followed up as soon as possible or go to the Emergency Department if any problems should occur.  Please show the CHEMOTHERAPY ALERT CARD or  IMMUNOTHERAPY ALERT CARD at check-in to the Emergency Department and triage nurse.  Should you have questions after your visit or need to cancel or reschedule your appointment, please contact [facility] - A DEPT [facility]  Dept: [phone]  and follow the prompts.  Office hours are 8:00 a.m. to 4:30 p.m. Monday - Friday. Please note that voicemails left after 4:00 p.m. may not be returned until the following business day.  We are closed weekends and major holidays. You have access to a nurse at all times for urgent questions. Please call the main number to the clinic Dept: [phone] and follow the prompts.   For any non-urgent questions, you may also contact your provider using MyChart. We now offer e-Visits for anyone 67 and older to request care online for non-urgent symptoms. For details visit [email or web address].   Also download the MyChart app! Go to the app store, search "MyChart", open the app, select [place], and log in with your MyChart username and password.

## 2023-03-12 ENCOUNTER — Ambulatory Visit: Payer: Medicare HMO

## 2023-03-20 ENCOUNTER — Other Ambulatory Visit: Payer: Self-pay | Admitting: Nurse Practitioner

## 2023-03-22 ENCOUNTER — Emergency Department (HOSPITAL_COMMUNITY): Admission: EM | Admit: 2023-03-22 | Discharge: 2023-03-22 | Disposition: A | Discharge status: Home / Self Care

## 2023-03-22 ENCOUNTER — Other Ambulatory Visit: Payer: Self-pay

## 2023-03-22 ENCOUNTER — Encounter (HOSPITAL_COMMUNITY): Payer: Self-pay

## 2023-03-22 ENCOUNTER — Emergency Department (HOSPITAL_COMMUNITY)

## 2023-03-22 DIAGNOSIS — E871 Hypo-osmolality and hyponatremia: Secondary | ICD-10-CM | POA: Diagnosis not present

## 2023-03-22 DIAGNOSIS — R040 Epistaxis: Secondary | ICD-10-CM | POA: Diagnosis present

## 2023-03-22 DIAGNOSIS — R197 Diarrhea, unspecified: Secondary | ICD-10-CM | POA: Insufficient documentation

## 2023-03-22 DIAGNOSIS — R000 Tachycardia, unspecified: Secondary | ICD-10-CM | POA: Diagnosis not present

## 2023-03-22 DIAGNOSIS — D649 Anemia, unspecified: Secondary | ICD-10-CM | POA: Diagnosis not present

## 2023-03-22 LAB — CBC WITH DIFFERENTIAL/PLATELET
Abs Immature Granulocytes: 0.12 10*3/uL — ABNORMAL HIGH (ref 0.00–0.07)
Basophils Absolute: 0.1 10*3/uL (ref 0.0–0.1)
Basophils Relative: 1 %
Eosinophils Absolute: 0 10*3/uL (ref 0.0–0.5)
Eosinophils Relative: 1 %
HCT: 30.5 % — ABNORMAL LOW (ref 39.0–52.0)
Hemoglobin: 9.7 g/dL — ABNORMAL LOW (ref 13.0–17.0)
Immature Granulocytes: 2 %
Lymphocytes Relative: 18 %
Lymphs Abs: 0.9 10*3/uL (ref 0.7–4.0)
MCH: 28.4 pg (ref 26.0–34.0)
MCHC: 31.8 g/dL (ref 30.0–36.0)
MCV: 89.2 fL (ref 80.0–100.0)
Monocytes Absolute: 1.8 10*3/uL — ABNORMAL HIGH (ref 0.1–1.0)
Monocytes Relative: 34 %
Neutro Abs: 2.4 10*3/uL (ref 1.7–7.7)
Neutrophils Relative %: 44 %
Platelets: 360 10*3/uL (ref 150–400)
RBC: 3.42 MIL/uL — ABNORMAL LOW (ref 4.22–5.81)
RDW: 19.1 % — ABNORMAL HIGH (ref 11.5–15.5)
WBC: 5.3 10*3/uL (ref 4.0–10.5)
nRBC: 1.5 % — ABNORMAL HIGH (ref 0.0–0.2)

## 2023-03-22 LAB — COMPREHENSIVE METABOLIC PANEL
ALT: 13 U/L (ref 0–44)
AST: 15 U/L (ref 15–41)
Albumin: 3.1 g/dL — ABNORMAL LOW (ref 3.5–5.0)
Alkaline Phosphatase: 62 U/L (ref 38–126)
Anion gap: 10 (ref 5–15)
BUN: 12 mg/dL (ref 8–23)
CO2: 22 mmol/L (ref 22–32)
Calcium: 9.1 mg/dL (ref 8.9–10.3)
Chloride: 99 mmol/L (ref 98–111)
Creatinine, Ser: 0.89 mg/dL (ref 0.61–1.24)
GFR, Estimated: >60 mL/min (ref >60)
Glucose, Bld: 106 mg/dL — ABNORMAL HIGH (ref 70–99)
Potassium: 4.2 mmol/L (ref 3.5–5.1)
Sodium: 131 mmol/L — ABNORMAL LOW (ref 135–145)
Total Bilirubin: 0.6 mg/dL (ref 0.0–1.2)
Total Protein: 6.8 g/dL (ref 6.5–8.1)

## 2023-03-22 LAB — LIPASE, BLOOD: Lipase: 20 U/L (ref 11–51)

## 2023-03-22 LAB — I-STAT CHEM 8, ED
BUN: 9 mg/dL (ref 8–23)
Calcium, Ion: 1.2 mmol/L (ref 1.15–1.40)
Chloride: 99 mmol/L (ref 98–111)
Creatinine, Ser: 1.1 mg/dL (ref 0.61–1.24)
Glucose, Bld: 106 mg/dL — ABNORMAL HIGH (ref 70–99)
HCT: 30 % — ABNORMAL LOW (ref 39.0–52.0)
Hemoglobin: 10.2 g/dL — ABNORMAL LOW (ref 13.0–17.0)
Potassium: 4.3 mmol/L (ref 3.5–5.1)
Sodium: 131 mmol/L — ABNORMAL LOW (ref 135–145)
TCO2: 23 mmol/L (ref 22–32)

## 2023-03-22 MED ORDER — HEPARIN SOD (PORK) LOCK FLUSH 100 UNIT/ML IV SOLN
500.0000 [IU] | Freq: Once | INTRAVENOUS | Status: AC
Start: 2023-03-22 — End: 2023-03-22
  Administered 2023-03-22: 500 [IU]
  Filled 2023-03-22: qty 5

## 2023-03-22 MED ORDER — SODIUM CHLORIDE 0.9 % IV BOLUS
1000.0000 mL | Freq: Once | INTRAVENOUS | Status: AC
  Administered 2023-03-22: 1000 mL via INTRAVENOUS

## 2023-03-22 NOTE — ED Triage Notes (Signed)
 Patient brought in by EMS due to nose bleed and diarrhea that started this morning. EMS reports that bleeding was controlled upon their arrival to the ER. Pt not currently on blood thinners. No other complaints.

## 2023-03-22 NOTE — ED Provider Notes (Signed)
 [facility] AT [facility] Provider Note   CSN: [id number] Arrival date & time: 03/22/23  1237     History {Add pertinent medical, surgical, social history, OB history to HPI:1} Chief Complaint  Patient presents with   Epistaxis   Diarrhea    Terry Isa. is a 68 y.o. male.  This is a 68 year old male presenting emergency department for epistaxis.  Reports some URI symptoms the past few days with congestion, rhinorrhea cough.  Reports spontaneous epistaxis this morning.  Called EMS.  Resolved prior to EMS arrival.  Notes that he has had 2 episodes of watery diarrhea this morning.  No blood.  He reports that he has not taken any of his medications this morning, also notes that he has not eating any food as well as he is decreased appetite for the past day or 2 as well. No recent antibiotic use.    Epistaxis Diarrhea      Home Medications Prior to Admission medications   Medication Sig Start Date End Date Taking? Authorizing Provider  acetaminophen (TYLENOL) 325 MG tablet Take 2 tablets (650 mg total) by mouth every 6 (six) hours as needed for mild pain or headache (fever >/= 101). 05/10/20   Arrien, York Ram, MD  amLODipine (NORVASC) 10 MG tablet Take 1 tablet by mouth daily. 06/14/20   [provider]  atorvastatin (LIPITOR) 20 MG tablet Take 20 mg by mouth daily. 01/28/19   [provider]  cyanocobalamin (VITAMIN B12) 1000 MCG tablet Take 1 tablet (1,000 mcg total) by mouth daily. 04/10/22   Georga Kaufmann T, PA-C  dexamethasone (DECADRON) 4 MG tablet Take 2 tabs by mouth 2 times daily starting day before chemo. Then take 2 tabs daily for 2 days starting day after chemo. Take with food. 02/26/23   Briant Cedar, PA-C  doxycycline (VIBRA-TABS) 100 MG tablet Take 1 tablet (100 mg total) by mouth 2 (two) times daily. 02/26/23   Briant Cedar, PA-C  dronabinol (MARINOL) 5 MG capsule Take 1 capsule (5 mg total) by mouth 2  (two) times daily before lunch and supper. 02/16/23   Pickenpack-Cousar, Arty Baumgartner, NP  ferrous sulfate 325 (65 FE) MG EC tablet Take 1 tablet (325 mg total) by mouth daily with breakfast. 03/10/22   Briant Cedar, PA-C  folic acid (FOLVITE) 1 MG tablet Take 1 tablet (1 mg total) by mouth daily. 03/09/23   Briant Cedar, PA-C  hydrOXYzine (ATARAX) 25 MG tablet Take 1 tablet (25 mg total) by mouth at bedtime as needed for itching. 09/01/21   Icard, Rachel Bo, DO  ipratropium-albuterol (DUONEB) 0.5-2.5 (3) MG/3ML SOLN Take 3 mLs by nebulization every 6 (six) hours as needed. Patient taking differently: Take 3 mLs by nebulization every 6 (six) hours as needed (shortness of breathing or wheezing). 05/10/20   Arrien, York Ram, MD  lidocaine (LIDODERM) 5 % Place 1 patch onto the skin daily. Remove & Discard patch within 12 hours or as directed by MD. Apply to low back 05/07/21   Rai, Ripudeep K, MD  lidocaine-prilocaine (EMLA) cream Apply 1 Application topically as needed. 06/21/21   Jaci Standard, MD  losartan (COZAAR) 100 MG tablet Take 100 mg by mouth daily. 05/22/21   [provider]  olmesartan (BENICAR) 40 MG tablet Take 40 mg by mouth daily. 03/03/21   [provider]  ondansetron (ZOFRAN) 8 MG tablet Take 1 tablet (8 mg total) by mouth every 8 (eight)  hours as needed for nausea or vomiting. 11/29/22   Jaci Standard, MD  polyethylene glycol (MIRALAX) 17 g packet Take 17 g by mouth daily. 07/07/22   Pickenpack-Cousar, Arty Baumgartner, NP  potassium chloride SA (KLOR-CON M) 20 MEQ tablet TAKE 1 TABLET(20 MEQ) BY MOUTH DAILY 02/21/23   Jaci Standard, MD  prochlorperazine (COMPAZINE) 10 MG tablet Take 1 tablet (10 mg total) by mouth every 6 (six) hours as needed for nausea or vomiting. 11/29/22   Jaci Standard, MD  senna-docusate (STIMULANT LAXATIVE) 8.6-50 MG tablet Take 2 tablets by mouth 2 (two) times daily. May add additional tablet as needed 11/29/22   Jaci Standard, MD   sodium chloride 1 g tablet Take 1 tablet (1 g total) by mouth 3 (three) times daily with meals. 02/12/23   Jaci Standard, MD  thiamine 100 MG tablet Take 1 tablet (100 mg total) by mouth daily. 05/08/21   Rai, Delene Ruffini, MD  traMADol (ULTRAM) 50 MG tablet Take 1-2 tablets (50-100 mg total) by mouth every 6 (six) hours as needed. 03/03/23   Pickenpack-Cousar, Arty Baumgartner, NP      Allergies    Morphine and Oxycodone    Review of Systems   Review of Systems  HENT:  Positive for nosebleeds.   Gastrointestinal:  Positive for diarrhea.    Physical Exam Updated Vital Signs BP 135/85   Pulse 95   Temp 97.9 F (36.6 C) (Oral)   Resp 18   Ht 5\' 9"  (1.753 m)   Wt 69.4 kg   SpO2 100%   BMI 22.59 kg/m  Physical Exam Vitals and nursing note reviewed.  Constitutional:      General: He is not in acute distress.    Appearance: He is not toxic-appearing.  HENT:     Head: Normocephalic.     Nose:     Comments: Right nare with some friable tissue in the anterior septum.  No active bleeding.    Mouth/Throat:     Mouth: Mucous membranes are moist.     Pharynx: No oropharyngeal exudate or posterior oropharyngeal erythema.     Comments: No blood in posterior pharynx Cardiovascular:     Rate and Rhythm: Regular rhythm. Tachycardia present.  Pulmonary:     Effort: Pulmonary effort is normal.  Abdominal:     General: Abdomen is flat. There is no distension.     Tenderness: There is no abdominal tenderness. There is no guarding or rebound.  Musculoskeletal:     Right lower leg: No edema.     Left lower leg: No edema.  Skin:    General: Skin is warm.     Capillary Refill: Capillary refill takes less than 2 seconds.  Neurological:     Mental Status: He is alert and oriented to person, place, and time.  Psychiatric:        Mood and Affect: Mood normal.        Behavior: Behavior normal.     ED Results / Procedures / Treatments   Labs (all labs ordered are listed, but only abnormal  results are displayed) Labs Reviewed  I-STAT CHEM 8, ED - Abnormal; Notable for the following components:      Result Value   Sodium 131 (*)    Glucose, Bld 106 (*)    Hemoglobin 10.2 (*)    HCT 30.0 (*)    All other components within normal limits  CBC WITH DIFFERENTIAL/PLATELET  COMPREHENSIVE  METABOLIC PANEL  LIPASE, BLOOD    EKG EKG Interpretation Date/Time:  Thursday March 22 2023 13:17:51 EDT Ventricular Rate:  110 PR Interval:  136 QRS Duration:  80 QT Interval:  328 QTC Calculation: 444 R Axis:   -4  Text Interpretation: Sinus tachycardia Borderline low voltage, extremity leads Confirmed by Estanislado Pandy [phone]) on 03/22/2023 1:32:16 PM  Radiology DG Chest Portable 1 View Result Date: 03/22/2023 CLINICAL DATA:  Nosebleed and diarrhea EXAM: PORTABLE CHEST 1 VIEW COMPARISON:  CT chest dated 02/15/2023 chest radiograph dated 08/02/2021 FINDINGS: Lines/tubes: Right chest wall port tip projects over the superior cavoatrial junction. Lungs: Left upper lobe scarring in keeping with posttreatment changes. No focal consolidation. Pleura: No pneumothorax or pleural effusion. Heart/mediastinum: The heart size and mediastinal contours are within normal limits. Bones: No acute osseous abnormality. Similar heterogeneous appearance of the left scapula. IMPRESSION: 1.  No focal consolidations. 2. Left upper lobe scarring in keeping with posttreatment changes. Electronically Signed   By: Agustin Cree M.D.   On: 03/22/2023 14:11    Procedures Procedures  {Document cardiac monitor, telemetry assessment procedure when appropriate:1}  Medications Ordered in ED Medications  sodium chloride 0.9 % bolus 1,000 mL (0 mLs Intravenous Stopped 03/22/23 1431)    ED Course/ Medical Decision Making/ A&P Clinical Course as of 03/22/23 1444  Thu Mar 22, 2023  1332 EKG 12-Lead EKG on my independent interpretation appears to be sinus tachycardia at a rate 110.  No ST segment changes that indicate ischemia.   Normal intervals.  No QTc prolongation. [TY]  1336 I-stat chem 8, ED(!) Chronic hyponatremia per review of old records. Appears to be near baseline.  [TY]  1443 BP appears to be improving with IVFs.  [TY]    Clinical Course User Index [TY] Coral Spikes, DO   {   Click here for ABCD2, HEART and other calculatorsREFRESH Note before signing :1}                              Medical Decision Making This is a 68 year old male with metastatic lung cancer presenting the emergency department for epistaxis.  Afebrile.  He is tachycardic, blood pressure soft, maintaining oxygen saturation on room air.  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  Per chart review last chemo 03/09/2023: Cycle 5 Docetaxel/Ramucircumab.  Presented today for epistaxis which has seemingly resolved spontaneously.  He does have somewhat concerning vitals, will get basic screening labs.  Vitals may be secondary to poor p.o. intake as he noted that he has not eaten today.  Will give IV fluids.  Echo 2022 with normal EF.  He has a benign abdominal exam, low suspicion for acute intra-abdominal pathology.  Amount and/or Complexity of Data Reviewed External Data Reviewed:     Details: See ED course.  Labs: ordered. Decision-making details documented in ED Course. Radiology: ordered. ECG/medicine tests: ordered. Decision-making details documented in ED Course.  Risk Decision regarding hospitalization.   {Document critical care time when appropriate:1} {Document review of labs and clinical decision tools ie heart score, Chads2Vasc2 etc:1}  {Document your independent review of radiology images, and any outside records:1} {Document your discussion with family members, caretakers, and with consultants:1} {Document social determinants of health affecting pt's care:1} {Document your decision making why or why not admission, treatments were needed:1} Final Clinical Impression(s) / ED Diagnoses Final diagnoses:   Epistaxis  Diarrhea, unspecified type    Rx / DC  Orders ED Discharge Orders     None

## 2023-03-22 NOTE — ED Provider Notes (Signed)
  Physical Exam  BP 125/83   Pulse 97   Temp 97.9 F (36.6 C) (Oral)   Resp 12   Ht 5\' 9"  (1.753 m)   Wt 69.4 kg   SpO2 100%   BMI 22.59 kg/m   Physical Exam  Procedures  Procedures  ED Course / MDM   Clinical Course as of 03/22/23 1730  Thu Mar 22, 2023  1332 EKG 12-Lead EKG on my independent interpretation appears to be sinus tachycardia at a rate 110.  No ST segment changes that indicate ischemia.  Normal intervals.  No QTc prolongation. [TY]  1336 I-stat chem 8, ED(!) Chronic hyponatremia per review of old records. Appears to be near baseline.  [TY]  1443 BP appears to be improving with IVFs.  [TY]    Clinical Course User Index [TY] Coral Spikes, DO   Medical Decision Making Amount and/or Complexity of Data Reviewed Labs: ordered. Decision-making details documented in ED Course. Radiology: ordered. ECG/medicine tests: ordered. Decision-making details documented in ED Course.   Received in signout.  Mild hyponatremia.  Mild anemia both appear to be at baseline.  Blood pressure improved.  Feels better.  Will discharge home.       Benjiman Core, MD 03/22/23 1730

## 2023-03-22 NOTE — ED Notes (Signed)
Lab called regarding CBC result.

## 2023-03-26 ENCOUNTER — Other Ambulatory Visit: Payer: Self-pay

## 2023-03-26 DIAGNOSIS — C7951 Secondary malignant neoplasm of bone: Secondary | ICD-10-CM

## 2023-03-26 DIAGNOSIS — Z515 Encounter for palliative care: Secondary | ICD-10-CM

## 2023-03-26 DIAGNOSIS — G893 Neoplasm related pain (acute) (chronic): Secondary | ICD-10-CM

## 2023-03-26 MED ORDER — TRAMADOL HCL 50 MG PO TABS: 50.0000 mg | ORAL_TABLET | Freq: Four times a day (QID) | ORAL | 0 refills | Status: DC | PRN

## 2023-03-26 NOTE — Telephone Encounter (Signed)
 Pt called for medication refill, see associated orders

## 2023-03-29 NOTE — Progress Notes (Unsigned)
 [facility] [phone]   [phone]  PROGRESS NOTE  Patient Care Team: Aliene Beams, MD as PCP - General (Family Medicine)  Hematological/Oncological History # Metastatic Adenocarcinoma of the Lung  01/07/2020 : CT of the chest with contrast performed which showed interval enlargement of a spiculated nodule in the central left upper lobe measuring 1.2 x 1.0 cm and highly suspicious for primary lung malignancy 02/06/2020 :PET scan performed which showed a hypermetabolic irregular solid 1.3 cm left upper lobe pulmonary nodule compatible with malignancy without any other hypermetabolic lesions 04/08/8293- 04/19/2020:  SBRT to the lung lesion 04/28/2021: CT C/A/P showed multifocal lytic bone metastases are identified. Lesion within the spine of the left scapula and lesions involving bilateral iliac bones and left sacral wing. Establish care with Dr. Leonides Schanz  05/03/2021: biopsy of right iliac lytic lesion showed metastatic carcinoma, consistent with lung primary.  07/08/2021: Cycle 1 Day 1 of Carbo/Pem/Pem 07/29/2021: Cycle 2 Day 1 of Carbo/Pem/Pem 08/11/2021-08/24/2021: Received palliative radiation to osseous metastasis in the left shoulder. 30 Gy in 10 fractions.  08/19/2021: Cycle 3 Day 1 of Carbo/Pem/Pem 09/09/2021: Cycle 4 Day 1 of Carbo/Pem/Pem 09/23/2021: CT CAP: stable disease 09/30/2021: Cycle 5 Day 1 of Pem/Pem 10/21/2021: Cycle 6 Day 1 of Pembrolizumab. Held pemetrexed due to anemia. 11/16/2021: Cycle 7 Day 1 of Pem/Pem.   12/16/2021: Cycle 8 Day 1 of Pem/Pem.   01/06/2022: Cycle 9 Day 1 of Pem/Pem.  01/27/2022: Cycle 10 Day 1 of Pem/Pem 02/17/2022: Cycle 11 Day 1 of Pem/Pem 03/10/2022: Cycle 12 Day 1 of Pem/Pem (HELD due to anemia with Hgb of 6.4) 03/17/2022: Cycle 12 Day 1 of Pem/Pem (HELD pemextrexed due to cytopenias/kidney function) 04/10/2022: Cycle 13 Day 1 of Pem/Pem (HELD pemextrexed due to cytopenias/kidney function) 04/28/2022: Cycle 14 Day 1 of Pem/Pem  (HELD pemextrexed due to cytopenias/kidney function) 05/23/2022:  Cycle 15 Day 1 of Pem/Pem (HELD pemextrexed due to cytopenias/kidney function) 06/15/2022: Cycle 16 Day 1 of Pembrolizumab.  07/07/2022: Cycle 17 Day 1 of Pembrolizumab.  07/28/2022: Cycle 18 Day 1 of Pembrolizumab 08/18/2022: Cycle 19 Day 1 of Pembrolizumab HELD due to hyponatremia of 118, new bone metastases. 08/25/2022: Cycle 19 Day 1 of Pembrolizumab 08/28/2022-09/13/2022: Received palliative radiation to right pelvis with total dose of 30 Gy in 10 Fx.  09/22/2022:  Cycle 20 Day 1 of Pembrolizumab 10/13/2022:  Cycle 21 Day 1 of Pembrolizumab 12/11/2022: Cycle 1 Day 1 of Docetaxel/Ramucircumab.  01/05/2023: Cycle 2 Day 1 of Docetaxel/Ramucircumab.  01/26/2023: Cycle 3 Day 1 of Docetaxel/Ramucircumab 02/16/2023:  Cycle 4 Day 1 of Docetaxel/Ramucircumab 03/09/2023: Cycle 5 Day 1 of Docetaxel/Ramucircumab  #Adenocarcinoma of the Prostate, T1cN0M0 08/19/2019-10/07/2019: 70 Gy in 28 fractions of 2.5 Gy.  Radiation to the prostate was under the care of Dr. Margaretmary Dys  Interval History:  Terry Page. 68 y.o. male with medical history significant for metastatic adenocarcinoma of the lung who presents for a follow up visit. The patient's last visit was on 03/09/2023. In the interim since the last visit he completed Cycle 5 of Docetaxel/Ramucircumab.   On exam today, Terry Page states last week he did go to the emergency department due to issues with diarrhea and a nosebleed.  He reports the diarrhea lasted for about 48 hours.  He notes the nosebleed required some paper towels to stop and was recurrent.  He notes that after that episode it did stop.  He reports that his energy has been good about an 8 out of 10.  His  appetite is strong and he is eating well.  He is had no chills or sweats or fevers.  He reports that he is also had no cough or sore throat.  He feels that she is tolerating the chemotherapy well.  He is not having any nausea  vomiting, or diarrhea.  He denies any lightheadedness or dizziness.  Overall he is at his baseline level of health and willing and able to proceed with treatment at this time.  Full 10 point ROS is otherwise negative.  MEDICAL HISTORY:  Past Medical History:  Diagnosis Date   Alcohol abuse    Asthma    Bullous emphysema ([name])    Essential hypertension 04/10/2020   Hemorrhoids    History of radiation therapy 04/08/20-04/19/20   IMRT- Left lung- Dr. Antony Blackbird   Incidental lung nodule, greater than or equal to 8mm 10/22/2017   Left upper lobe - discovered on CTA   Prostate cancer ([name])    Spontaneous pneumothorax 10/20/2017   right   Tobacco abuse     SURGICAL HISTORY: Past Surgical History:  Procedure Laterality Date   BACK SURGERY     IR IMAGING GUIDED PORT INSERTION  06/29/2021   PROSTATE BIOPSY      SOCIAL HISTORY: Social History   Socioeconomic History   Marital status: Single    Spouse name: Not on file   Number of children: Not on file   Years of education: Not on file   Highest education level: Not on file  Occupational History   Occupation: retired  Tobacco Use   Smoking status: Former    Current packs/day: 0.00    Types: Cigarettes    Quit date: 11/22/2019    Years since quitting: 3.3   Smokeless tobacco: Never   Tobacco comments:    Patient reports quit 3 years ago. 06/25/20. [facility]  Vaping Use   Vaping status: Never Used  Substance and Sexual Activity   Alcohol use: Yes    Alcohol/week: 12.0 standard drinks of alcohol    Types: 12 Cans of beer per week    Comment: daily sometimes   Drug use: No   Sexual activity: Not Currently  Other Topics Concern   Not on file  Social History Narrative   Not on file   Social Drivers of Health   Financial Resource Strain: Not on file  Food Insecurity: Not on file  Transportation Needs: Unmet Transportation Needs (08/18/2022)   PRAPARE - Administrator, Civil Service (Medical): Yes    Lack of  Transportation (Non-Medical): No  Physical Activity: Not on file  Stress: Not on file  Social Connections: Not on file  Intimate Partner Violence: Not on file    FAMILY HISTORY: Family History  Problem Relation Age of Onset   Cancer Cousin        maternal cousin   Cancer Cousin        paternal cousin   Cancer Cousin    Colon polyps Neg Hx    Pancreatic disease Neg Hx    Pancreatic cancer Neg Hx    Breast cancer Neg Hx    Colon cancer Neg Hx     ALLERGIES:  is allergic to morphine and oxycodone.  MEDICATIONS:  Current Outpatient Medications  Medication Sig Dispense Refill   acetaminophen (TYLENOL) 325 MG tablet Take 2 tablets (650 mg total) by mouth every 6 (six) hours as needed for mild pain or headache (fever >/= 101).     amLODipine (NORVASC) 10  MG tablet Take 1 tablet by mouth daily.     atorvastatin (LIPITOR) 20 MG tablet Take 20 mg by mouth daily.     cyanocobalamin (VITAMIN B12) 1000 MCG tablet Take 1 tablet (1,000 mcg total) by mouth daily. 90 tablet 1   dexamethasone (DECADRON) 4 MG tablet Take 2 tabs by mouth 2 times daily starting day before chemo. Then take 2 tabs daily for 2 days starting day after chemo. Take with food. 30 tablet 1   doxycycline (VIBRA-TABS) 100 MG tablet Take 1 tablet (100 mg total) by mouth 2 (two) times daily. 14 tablet 0   dronabinol (MARINOL) 5 MG capsule Take 1 capsule (5 mg total) by mouth 2 (two) times daily before lunch and supper. 60 capsule 0   ferrous sulfate 325 (65 FE) MG EC tablet Take 1 tablet (325 mg total) by mouth daily with breakfast. 30 tablet 3   folic acid (FOLVITE) 1 MG tablet Take 1 tablet (1 mg total) by mouth daily. 90 tablet 3   hydrOXYzine (ATARAX) 25 MG tablet Take 1 tablet (25 mg total) by mouth at bedtime as needed for itching. 30 tablet 11   ipratropium-albuterol (DUONEB) 0.5-2.5 (3) MG/3ML SOLN Take 3 mLs by nebulization every 6 (six) hours as needed. (Patient taking differently: Take 3 mLs by nebulization every 6  (six) hours as needed (shortness of breathing or wheezing).) 360 mL 0   lidocaine (LIDODERM) 5 % Place 1 patch onto the skin daily. Remove & Discard patch within 12 hours or as directed by MD. Apply to low back 30 patch 0   lidocaine-prilocaine (EMLA) cream Apply 1 Application topically as needed. 30 g 0   losartan (COZAAR) 100 MG tablet Take 100 mg by mouth daily.     olmesartan (BENICAR) 40 MG tablet Take 40 mg by mouth daily.     ondansetron (ZOFRAN) 8 MG tablet Take 1 tablet (8 mg total) by mouth every 8 (eight) hours as needed for nausea or vomiting. 30 tablet 1   polyethylene glycol (MIRALAX) 17 g packet Take 17 g by mouth daily. 30 each 2   potassium chloride SA (KLOR-CON M) 20 MEQ tablet TAKE 1 TABLET(20 MEQ) BY MOUTH DAILY 90 tablet 0   prochlorperazine (COMPAZINE) 10 MG tablet Take 1 tablet (10 mg total) by mouth every 6 (six) hours as needed for nausea or vomiting. 30 tablet 1   senna-docusate (STIMULANT LAXATIVE) 8.6-50 MG tablet Take 2 tablets by mouth 2 (two) times daily. May add additional tablet as needed 150 tablet 3   sodium chloride 1 g tablet Take 1 tablet (1 g total) by mouth 3 (three) times daily with meals. 90 tablet 3   thiamine 100 MG tablet Take 1 tablet (100 mg total) by mouth daily.     traMADol (ULTRAM) 50 MG tablet Take 1-2 tablets (50-100 mg total) by mouth every 6 (six) hours as needed. 120 tablet 0   No current facility-administered medications for this visit.    REVIEW OF SYSTEMS:   Constitutional: ( - ) fevers, ( - )  chills , ( - ) night sweats Eyes: ( - ) blurriness of vision, ( - ) double vision, ( - ) watery eyes Ears, nose, mouth, throat, and face: ( - ) mucositis, ( - ) sore throat Respiratory: ( - ) cough, ( - ) dyspnea, ( - ) wheezes Cardiovascular: ( - ) palpitation, ( - ) chest discomfort, ( - ) lower extremity swelling Gastrointestinal:  ( - ) nausea, ( - )  heartburn, ( - ) change in bowel habits Skin: ( - ) abnormal skin rashes Lymphatics: ( - )  new lymphadenopathy, ( - ) easy bruising Neurological: ( - ) numbness, ( - ) tingling, ( - ) new weaknesses Behavioral/Psych: ( - ) mood change, ( - ) new changes  All other systems were reviewed with the patient and are negative.  PHYSICAL EXAMINATION: ECOG PERFORMANCE STATUS: 1 - Symptomatic but completely ambulatory  There were no vitals filed for this visit.    There were no vitals filed for this visit.  GENERAL: Well-appearing elderly African-American male, alert, no distress and comfortable SKIN: skin color, texture, turgor are normal, no rashes or significant lesions EYES: conjunctiva are pink and non-injected, sclera clear LUNGS: clear to auscultation and percussion with normal breathing effort HEART: regular rate & rhythm and no murmurs and no lower extremity edema Musculoskeletal: no cyanosis of digits and no clubbing  PSYCH: alert & oriented x 3, fluent speech NEURO: no focal motor/sensory deficits  LABORATORY DATA:  I have reviewed the data as listed    Latest Ref Rng & Units 03/22/2023    1:37 PM 03/22/2023    1:32 PM 03/09/2023   10:07 AM  CBC  WBC 4.0 - 10.5 K/uL 5.3   17.2   Hemoglobin 13.0 - 17.0 g/dL 9.7  21.3  9.9   Hematocrit 39.0 - 52.0 % 30.5  30.0  29.9   Platelets 150 - 400 K/uL 360   402        Latest Ref Rng & Units 03/22/2023    1:37 PM 03/22/2023    1:32 PM 03/09/2023   10:07 AM  CMP  Glucose 70 - 99 mg/dL 086  578  469   BUN 8 - 23 mg/dL 12  9  15    Creatinine 0.61 - 1.24 mg/dL 6.29  5.28  4.13   Sodium 135 - 145 mmol/L 131  131  133   Potassium 3.5 - 5.1 mmol/L 4.2  4.3  4.6   Chloride 98 - 111 mmol/L 99  99  102   CO2 22 - 32 mmol/L 22   26   Calcium 8.9 - 10.3 mg/dL 9.1   9.1   Total Protein 6.5 - 8.1 g/dL 6.8   6.9   Total Bilirubin 0.0 - 1.2 mg/dL 0.6   0.2   Alkaline Phos 38 - 126 U/L 62   70   AST 15 - 41 U/L 15   13   ALT 0 - 44 U/L 13   11     Lab Results  Component Value Date   MPROTEIN Not Observed 04/28/2021   Lab Results   Component Value Date   KPAFRELGTCHN 33.6 (H) 04/28/2021   LAMBDASER 22.0 04/28/2021   KAPLAMBRATIO 1.53 04/28/2021    RADIOGRAPHIC STUDIES: DG Chest Portable 1 View Result Date: 03/22/2023 CLINICAL DATA:  Nosebleed and diarrhea EXAM: PORTABLE CHEST 1 VIEW COMPARISON:  CT chest dated 02/15/2023 chest radiograph dated 08/02/2021 FINDINGS: Lines/tubes: Right chest wall port tip projects over the superior cavoatrial junction. Lungs: Left upper lobe scarring in keeping with posttreatment changes. No focal consolidation. Pleura: No pneumothorax or pleural effusion. Heart/mediastinum: The heart size and mediastinal contours are within normal limits. Bones: No acute osseous abnormality. Similar heterogeneous appearance of the left scapula. IMPRESSION: 1.  No focal consolidations. 2. Left upper lobe scarring in keeping with posttreatment changes. Electronically Signed   By: Agustin Cree M.D.   On: 03/22/2023 14:11  ASSESSMENT & PLAN Terry Page. Is a 68 y.o.  male with medical history significant for metastatic adenocarcinoma of the lung who presents for a follow up visit.  # Metastatic Adenocarcinoma of the Lung  -- NGS testing from this patient shows no evidence of targetable mutation. --MRI brain shows no evidence of intracranial metastases --Started carboplatin, pembrolizumab, and pemetrexed as treatment for his cancer on 07/08/2021. --Received palliative radiation to osseous metastasis of the left shoulder from 08/11/2021-08/24/2021. Planned dose is 30 Gy in 10 fx.  --Discontinued Pemetrexed on 03/17/2022 due to persistent anemia and kidney dysfunction. --Restaging CT CAP from 08/09/2022 shows interval enlargement of multiple mixed lytic and sclerotic bone metastases involving the right pelvis. Other bone metastases including of the left scapula, sacrum, and left hemipelvis are unchanged. Unchanged treated central LUL mass. Recommend to continue on pembrolizumab therapy and arrange for palliative  radiation to enlarging bone metastases.  Plan: --today is Cycle 6 Day 1 of Docetaxel/Ramucirumab.  --labs from today were reviewed. Labs show white blood cell count 11.1, Hgb 9.6, MCV 86.5, Plt 456. Creatinine and LFTs normal.  --Most recent CT scan from 02/15/2023 showed treatment response. Next one will be due in May 2025.  --RTC in 3 week with labs and follow up visit before Cycle 7.   #Leukocytosis-neutrophil predominant: --Patient denies any infectious symptoms, elevation is likely secondary to growth factor --Held GCSF injection with Cycle 4 and no evidence of neutropenia with today's labs. Continue to hold injection for future cycles. Can add back in if required.   #Left lower eyelid stye--nearly resolved: --Applying warm compresses, recommend to continue --Completed 7 day course of doxycycline 100 mg BID.   #Left sided low back pain with sciatica: --History of lumbar spondylosis and moderate to severe facet arthrosis without spinal stenosis and mild right neural foraminal stenosis at L4-5 seen on MRI lumbar 04/24/2020.  --Will make referral to Neurosurgery to evaluate for steroid injections  #Nail toxicities: --Patient has discoloration of nail bed with some tenderness --Advised to monitor for infection and do warm salt water soaks 4x/day  #Right hip pain-stable --Secondary progressive bone metastases in right pelvis seen on CT imaging from 08/09/2022 --Received palliative radiation to the right pelvis from 08/28/2022-09/13/2022 with total dose of 30 Gy in 10 Fx.  --Pain is slightly improved but still present.  --Continue to take tramadol q 6 hours for pain control.   #Hyponatremia-improved --Chronic in nature --Sodium level dropped from 127 on 08/09/2022 to 118.  Today sodium stable to 130.  --Etiologies including SIADH, immunotherapy induced, too much water intake.  --Patient is asymptomatic without nausea/vomiting, headaches, confusion, seizures, etc --Currently on salt tablets 2  gm PO daily. We advised to titrate to twice daily x 7 days and then three times daily.  --Strict ED precautions given for any new symptoms as discussed above.   # Iron Deficiency Anemia #Chemotherapy induced anemia --Currently on ferrous sulfate 325 mg PO daily  #Pain Control --stopped oxycodone due to itching --currently following with Palliative care.  --pain well controlled with tramadol as needed. Continue to take tramdol 50-100 mg q 6 hours as needed. --Continue senna docusate and miralax for constipation prophylaxis --Continue to monitor  #Supportive Care -- chemotherapy education complete -- port placed -- zofran 8mg  q8H PRN and compazine 10mg  PO q6H for nausea -- EMLA cream for port -- Patient referred to palliative care for pain control. Pain control as above.    No orders of the defined types were placed in  this encounter.   All questions were answered. The patient knows to call the clinic with any problems, questions or concerns.  I have spent a total of 30 minutes minutes of face-to-face and non-face-to-face time, preparing to see the patient, performing a medically appropriate examination, counseling and educating the patient, referring and communicating with other health care professionals, documenting clinical information in the electronic health record, and care coordination.   Ulysees Barns, MD Department of Hematology/Oncology [facility] Phone: [phone] Pager: [phone] Email: [email or web address]    03/29/2023 4:11 PM

## 2023-03-30 ENCOUNTER — Encounter: Payer: Self-pay | Admitting: Nurse Practitioner

## 2023-03-30 ENCOUNTER — Inpatient Hospital Stay

## 2023-03-30 ENCOUNTER — Other Ambulatory Visit: Payer: Self-pay | Admitting: *Deleted

## 2023-03-30 ENCOUNTER — Ambulatory Visit (HOSPITAL_BASED_OUTPATIENT_CLINIC_OR_DEPARTMENT_OTHER): Payer: Medicare HMO | Admitting: Nurse Practitioner

## 2023-03-30 ENCOUNTER — Inpatient Hospital Stay: Payer: Medicare HMO | Admitting: Hematology and Oncology

## 2023-03-30 ENCOUNTER — Inpatient Hospital Stay: Payer: Medicare HMO

## 2023-03-30 ENCOUNTER — Encounter: Payer: Self-pay | Admitting: Hematology and Oncology

## 2023-03-30 VITALS — BP 101/75 | HR 65 | Temp 97.7°F | Resp 16 | Wt 151.2 lb

## 2023-03-30 DIAGNOSIS — R634 Abnormal weight loss: Secondary | ICD-10-CM | POA: Diagnosis not present

## 2023-03-30 DIAGNOSIS — Z5112 Encounter for antineoplastic immunotherapy: Secondary | ICD-10-CM

## 2023-03-30 DIAGNOSIS — G893 Neoplasm related pain (acute) (chronic): Secondary | ICD-10-CM | POA: Diagnosis not present

## 2023-03-30 DIAGNOSIS — Z515 Encounter for palliative care: Secondary | ICD-10-CM | POA: Diagnosis not present

## 2023-03-30 DIAGNOSIS — R630 Anorexia: Secondary | ICD-10-CM

## 2023-03-30 DIAGNOSIS — C3412 Malignant neoplasm of upper lobe, left bronchus or lung: Secondary | ICD-10-CM

## 2023-03-30 DIAGNOSIS — Z95828 Presence of other vascular implants and grafts: Secondary | ICD-10-CM

## 2023-03-30 DIAGNOSIS — R530 Neoplastic (malignant) related fatigue: Secondary | ICD-10-CM

## 2023-03-30 DIAGNOSIS — D649 Anemia, unspecified: Secondary | ICD-10-CM

## 2023-03-30 LAB — CBC WITH DIFFERENTIAL (CANCER CENTER ONLY)
Abs Immature Granulocytes: 0.18 10*3/uL — ABNORMAL HIGH (ref 0.00–0.07)
Basophils Absolute: 0.1 10*3/uL (ref 0.0–0.1)
Basophils Relative: 1 %
Eosinophils Absolute: 0 10*3/uL (ref 0.0–0.5)
Eosinophils Relative: 0 %
HCT: 29.5 % — ABNORMAL LOW (ref 39.0–52.0)
Hemoglobin: 9.6 g/dL — ABNORMAL LOW (ref 13.0–17.0)
Immature Granulocytes: 2 %
Lymphocytes Relative: 12 %
Lymphs Abs: 1.3 10*3/uL (ref 0.7–4.0)
MCH: 28.2 pg (ref 26.0–34.0)
MCHC: 32.5 g/dL (ref 30.0–36.0)
MCV: 86.5 fL (ref 80.0–100.0)
Monocytes Absolute: 1.1 10*3/uL — ABNORMAL HIGH (ref 0.1–1.0)
Monocytes Relative: 10 %
Neutro Abs: 8.4 10*3/uL — ABNORMAL HIGH (ref 1.7–7.7)
Neutrophils Relative %: 75 %
Platelet Count: 456 10*3/uL — ABNORMAL HIGH (ref 150–400)
RBC: 3.41 MIL/uL — ABNORMAL LOW (ref 4.22–5.81)
RDW: 20 % — ABNORMAL HIGH (ref 11.5–15.5)
WBC Count: 11.1 10*3/uL — ABNORMAL HIGH (ref 4.0–10.5)
nRBC: 0 % (ref 0.0–0.2)

## 2023-03-30 LAB — TSH: TSH: 2.86 u[IU]/mL (ref 0.350–4.500)

## 2023-03-30 LAB — CMP (CANCER CENTER ONLY)
ALT: 9 U/L (ref 0–44)
AST: 14 U/L — ABNORMAL LOW (ref 15–41)
Albumin: 3.2 g/dL — ABNORMAL LOW (ref 3.5–5.0)
Alkaline Phosphatase: 67 U/L (ref 38–126)
Anion gap: 9 (ref 5–15)
BUN: 8 mg/dL (ref 8–23)
CO2: 24 mmol/L (ref 22–32)
Calcium: 9.1 mg/dL (ref 8.9–10.3)
Chloride: 100 mmol/L (ref 98–111)
Creatinine: 0.88 mg/dL (ref 0.61–1.24)
GFR, Estimated: >60 mL/min (ref >60)
Glucose, Bld: 88 mg/dL (ref 70–99)
Potassium: 3.8 mmol/L (ref 3.5–5.1)
Sodium: 133 mmol/L — ABNORMAL LOW (ref 135–145)
Total Bilirubin: 0.4 mg/dL (ref 0.0–1.2)
Total Protein: 6.7 g/dL (ref 6.5–8.1)

## 2023-03-30 LAB — SAMPLE TO BLOOD BANK

## 2023-03-30 LAB — TOTAL PROTEIN, URINE DIPSTICK: Protein, ur: NEGATIVE mg/dL

## 2023-03-30 MED ORDER — SODIUM CHLORIDE 0.9 % IV SOLN
75.0000 mg/m2 | Freq: Once | INTRAVENOUS | Status: AC
  Administered 2023-03-30: 136 mg via INTRAVENOUS
  Filled 2023-03-30: qty 13.6

## 2023-03-30 MED ORDER — HEPARIN SOD (PORK) LOCK FLUSH 100 UNIT/ML IV SOLN
500.0000 [IU] | Freq: Once | INTRAVENOUS | Status: AC | PRN
  Administered 2023-03-30: 500 [IU]

## 2023-03-30 MED ORDER — DIPHENHYDRAMINE HCL 50 MG/ML IJ SOLN
50.0000 mg | Freq: Once | INTRAMUSCULAR | Status: AC
  Administered 2023-03-30: 50 mg via INTRAVENOUS
  Filled 2023-03-30: qty 1

## 2023-03-30 MED ORDER — SODIUM CHLORIDE 1 G PO TABS: 1.0000 g | ORAL_TABLET | Freq: Three times a day (TID) | ORAL | 3 refills | Status: DC

## 2023-03-30 MED ORDER — DEXAMETHASONE SODIUM PHOSPHATE 10 MG/ML IJ SOLN
10.0000 mg | Freq: Once | INTRAMUSCULAR | Status: AC
  Administered 2023-03-30: 10 mg via INTRAVENOUS
  Filled 2023-03-30: qty 1

## 2023-03-30 MED ORDER — ACETAMINOPHEN 325 MG PO TABS
650.0000 mg | ORAL_TABLET | Freq: Once | ORAL | Status: AC
  Administered 2023-03-30: 650 mg via ORAL
  Filled 2023-03-30: qty 2

## 2023-03-30 MED ORDER — DRONABINOL 5 MG PO CAPS: 5.0000 mg | ORAL_CAPSULE | Freq: Two times a day (BID) | ORAL | 0 refills | Status: DC

## 2023-03-30 MED ORDER — SODIUM CHLORIDE 0.9 % IV SOLN
10.0000 mg/kg | Freq: Once | INTRAVENOUS | Status: AC
  Administered 2023-03-30: 700 mg via INTRAVENOUS
  Filled 2023-03-30: qty 20

## 2023-03-30 MED ORDER — SODIUM CHLORIDE 0.9% FLUSH
10.0000 mL | Freq: Once | INTRAVENOUS | Status: AC
  Administered 2023-03-30: 10 mL

## 2023-03-30 MED ORDER — SODIUM CHLORIDE 0.9 % IV SOLN
INTRAVENOUS | Status: DC
Start: 2023-03-30 — End: 2023-03-30

## 2023-03-30 MED ORDER — SODIUM CHLORIDE 0.9% FLUSH
10.0000 mL | INTRAVENOUS | Status: DC | PRN
Start: 2023-03-30 — End: 2023-03-30
  Administered 2023-03-30: 10 mL

## 2023-03-30 NOTE — Patient Instructions (Signed)
 [facility] - A DEPT OF [facility]  Discharge Instructions: Thank you for choosing [facility] to provide your oncology and hematology care.   If you have a lab appointment with the Cancer Center, please go directly to the Cancer Center and check in at the registration area.   Wear comfortable clothing and clothing appropriate for easy access to any Portacath or PICC line.   We strive to give you quality time with your provider. You may need to reschedule your appointment if you arrive late (15 or more minutes).  Arriving late affects you and other patients whose appointments are after yours.  Also, if you miss three or more appointments without notifying the office, you may be dismissed from the clinic at the provider's discretion.      For prescription refill requests, have your pharmacy contact our office and allow 72 hours for refills to be completed.    Today you received the following chemotherapy and/or immunotherapy agents: Cyramza/Docetaxel      To help prevent nausea and vomiting after your treatment, we encourage you to take your nausea medication as directed.  BELOW ARE SYMPTOMS THAT SHOULD BE REPORTED IMMEDIATELY: *FEVER GREATER THAN 100.4 F (38 C) OR HIGHER *CHILLS OR SWEATING *NAUSEA AND VOMITING THAT IS NOT CONTROLLED WITH YOUR NAUSEA MEDICATION *UNUSUAL SHORTNESS OF BREATH *UNUSUAL BRUISING OR BLEEDING *URINARY PROBLEMS (pain or burning when urinating, or frequent urination) *BOWEL PROBLEMS (unusual diarrhea, constipation, pain near the anus) TENDERNESS IN MOUTH AND THROAT WITH OR WITHOUT PRESENCE OF ULCERS (sore throat, sores in mouth, or a toothache) UNUSUAL RASH, SWELLING OR PAIN  UNUSUAL VAGINAL DISCHARGE OR ITCHING   Items with * indicate a potential emergency and should be followed up as soon as possible or go to the Emergency Department if any problems should occur.  Please show the CHEMOTHERAPY ALERT CARD or  IMMUNOTHERAPY ALERT CARD at check-in to the Emergency Department and triage nurse.  Should you have questions after your visit or need to cancel or reschedule your appointment, please contact [facility] - A DEPT OF [facility]  Dept: [phone]  and follow the prompts.  Office hours are 8:00 a.m. to 4:30 p.m. Monday - Friday. Please note that voicemails left after 4:00 p.m. may not be returned until the following business day.  We are closed weekends and major holidays. You have access to a nurse at all times for urgent questions. Please call the main number to the clinic Dept: [phone] and follow the prompts.   For any non-urgent questions, you may also contact your provider using MyChart. We now offer e-Visits for anyone 92 and older to request care online for non-urgent symptoms. For details visit [email or web address].   Also download the MyChart app! Go to the app store, search "MyChart", open the app, select [facility], and log in with your MyChart username and password.

## 2023-03-30 NOTE — Progress Notes (Signed)
 Palliative Medicine [facility]  [phone] [phone]   Name: Terry Page. Date: 03/30/2023 MRN: [id number]  DOB: December 11, 1955  Patient Care Team: Aliene Beams, MD as PCP - General (Family Medicine)    INTERVAL HISTORY: Terry Jani. is a 68 y.o. 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 He is currently undergoing chemotherapy. Palliative ask to see for symptom management.  SOCIAL HISTORY:    Terry Page reports that he quit smoking about 3 years ago. His smoking use included cigarettes. He has never used smokeless tobacco. He reports current alcohol use of about 12.0 standard drinks of alcohol per week. He reports that he does not use drugs.  ADVANCE DIRECTIVES:  None on file   CODE STATUS: Full Code  PAST MEDICAL HISTORY: Past Medical History:  Diagnosis Date   Alcohol abuse    Asthma    Bullous emphysema ([name])    Essential hypertension 04/10/2020   Hemorrhoids    History of radiation therapy 04/08/20-04/19/20   IMRT- Left lung- Dr. Antony Blackbird   Incidental lung nodule, greater than or equal to 8mm 10/22/2017   Left upper lobe - discovered on CTA   Prostate cancer ([name])    Spontaneous pneumothorax 10/20/2017   right   Tobacco abuse     ALLERGIES:  is allergic to morphine and oxycodone.  MEDICATIONS:  Current Outpatient Medications  Medication Sig Dispense Refill   acetaminophen (TYLENOL) 325 MG tablet Take 2 tablets (650 mg total) by mouth every 6 (six) hours as needed for mild pain or headache (fever >/= 101).     amLODipine (NORVASC) 10 MG tablet Take 1 tablet by mouth daily.     atorvastatin (LIPITOR) 20 MG tablet Take 20 mg by mouth daily.     cyanocobalamin (VITAMIN B12) 1000 MCG tablet Take 1 tablet (1,000 mcg total) by mouth daily. 90 tablet 1    dexamethasone (DECADRON) 4 MG tablet Take 2 tabs by mouth 2 times daily starting day before chemo. Then take 2 tabs daily for 2 days starting day after chemo. Take with food. 30 tablet 1   doxycycline (VIBRA-TABS) 100 MG tablet Take 1 tablet (100 mg total) by mouth 2 (two) times daily. 14 tablet 0   dronabinol (MARINOL) 5 MG capsule Take 1 capsule (5 mg total) by mouth 2 (two) times daily before lunch and supper. 60 capsule 0   ferrous sulfate 325 (65 FE) MG EC tablet Take 1 tablet (325 mg total) by mouth daily with breakfast. 30 tablet 3   folic acid (FOLVITE) 1 MG tablet Take 1 tablet (1 mg total) by mouth daily. 90 tablet 3   hydrOXYzine (ATARAX) 25 MG tablet Take 1 tablet (25 mg total) by mouth at bedtime as needed for itching. 30 tablet 11   ipratropium-albuterol (DUONEB) 0.5-2.5 (3) MG/3ML SOLN Take 3 mLs by nebulization every 6 (six) hours as needed. (Patient taking differently: Take 3 mLs by nebulization every 6 (six) hours as needed (shortness of breathing or wheezing).) 360 mL 0   lidocaine (LIDODERM) 5 % Place 1 patch onto the skin daily. Remove & Discard patch within 12 hours or as directed by MD. Apply to low back 30 patch 0   lidocaine-prilocaine (EMLA) cream Apply 1 Application topically as needed. 30 g 0   losartan (COZAAR) 100 MG tablet Take 100 mg by  mouth daily.     olmesartan (BENICAR) 40 MG tablet Take 40 mg by mouth daily.     ondansetron (ZOFRAN) 8 MG tablet Take 1 tablet (8 mg total) by mouth every 8 (eight) hours as needed for nausea or vomiting. 30 tablet 1   polyethylene glycol (MIRALAX) 17 g packet Take 17 g by mouth daily. 30 each 2   potassium chloride SA (KLOR-CON M) 20 MEQ tablet TAKE 1 TABLET(20 MEQ) BY MOUTH DAILY 90 tablet 0   prochlorperazine (COMPAZINE) 10 MG tablet Take 1 tablet (10 mg total) by mouth every 6 (six) hours as needed for nausea or vomiting. 30 tablet 1   senna-docusate (STIMULANT LAXATIVE) 8.6-50 MG tablet Take 2 tablets by mouth 2 (two) times daily.  May add additional tablet as needed 150 tablet 3   sodium chloride 1 g tablet Take 1 tablet (1 g total) by mouth 3 (three) times daily with meals. 90 tablet 3   thiamine 100 MG tablet Take 1 tablet (100 mg total) by mouth daily.     traMADol (ULTRAM) 50 MG tablet Take 1-2 tablets (50-100 mg total) by mouth every 6 (six) hours as needed. 120 tablet 0   No current facility-administered medications for this visit.   Facility-Administered Medications Ordered in Other Visits  Medication Dose Route Frequency Provider Last Rate Last Admin   0.9 %  sodium chloride infusion   Intravenous Continuous Jaci Standard, MD 10 mL/hr at 03/30/23 0940 New Bag at 03/30/23 0940   DOCEtaxel (TAXOTERE) 136 mg in sodium chloride 0.9 % 250 mL chemo infusion  75 mg/m2 (Treatment Plan Recorded) Intravenous Once Ulysees Barns IV, MD       heparin lock flush 100 unit/mL  500 Units Intracatheter Once PRN Jaci Standard, MD       ramucirumab [facility]) 700 mg in sodium chloride 0.9 % 180 mL chemo infusion  10 mg/kg (Treatment Plan Recorded) Intravenous Once Ulysees Barns IV, MD       sodium chloride flush (NS) 0.9 % injection 10 mL  10 mL Intracatheter PRN Jaci Standard, MD        VITAL SIGNS: There were no vitals taken for this visit. There were no vitals filed for this visit.   Estimated body mass index is 22.33 kg/m as calculated from the following:   Height as of 03/22/23: 5\' 9"  (1.753 m).   Weight as of an earlier encounter on 03/30/23: 151 lb 3.2 oz (68.6 kg).   PERFORMANCE STATUS (ECOG) : 1 - Symptomatic but completely ambulatory  Assessment NAD RRR Normal breathing pattern AAO x4  Discussed the use of AI scribe software for clinical note transcription with the patient, who gave verbal consent to proceed.   IMPRESSION:  Terry Page. Presents to clinic for follow-up. No acute distress. Ambulatory with a cane. Denies concerns with nausea, vomiting, constipation, or diarrhea. Take  daily stool softener. Occasional fatigue however he tries to remain as active as possible. Appetite fluctuates. Some days are better than others.  Current weight is 151 pounds he was 153 pounds on March 7. Will send in refill for dronabinol which should be available now. Recent ED visit due to uncontrollable nose bleed and diarrhea. Symptoms resolved.   He reports his pain is well controlled on current regimen. Currently taking Tramadol 1-2 tablets as needed with occasional Tylenol use. We will continue with current regimen. No adjustments at this time.   All questions answered  and support provided.  Assessment and Plan  Chronic Cancer Related Pain Pain controlled on current regimen. No adjustments at this time.   -Continue current pain medication regimen. -Tramadol 50-100mg  every 4-6 hours as needed. -Contact office with any changes or worsening symptoms. Patient is sensitive to medications. Could consider low dose gabapentin if needed in future.   Constipation Improved with regular use of Senna. -Continue current regimen of Senna twice daily and monitor bowel movements.  Weight Loss Weight remains stable -Monitor weight to ensure it remains stable. -Discussed ways to increase appetite and protein intake -Marinol 5mg  twice daily  Appetite Variable, but patient is still eating. -Monitor appetite and weight.  Follow-up in 4-6 weeks.Sooner if needed.  Patient expressed understanding and was in agreement with this plan. He also understands that He can call the clinic at any time with any questions, concerns, or complaints.   Any controlled substances utilized were prescribed in the context of palliative care. PDMP has been reviewed.    Visit consisted of counseling and education dealing with the complex and emotionally intense issues of symptom management and palliative care in the setting of serious and potentially life-threatening illness.  [name], AGPCNP-BC   Palliative Medicine Team/[facility]

## 2023-03-31 LAB — T4: T4, Total: 7.8 ug/dL (ref 4.5–12.0)

## 2023-04-02 ENCOUNTER — Telehealth: Payer: Self-pay

## 2023-04-02 ENCOUNTER — Ambulatory Visit: Payer: Medicare HMO

## 2023-04-02 NOTE — Telephone Encounter (Signed)
 Pt called asking for information regarding dronabinol, education provided, medication not currently available at pharmacy but will be on 04/03/23, pt updated and verbalized understanding. No further needs at this time.

## 2023-04-08 ENCOUNTER — Emergency Department (HOSPITAL_COMMUNITY)

## 2023-04-08 ENCOUNTER — Encounter (HOSPITAL_COMMUNITY): Payer: Self-pay

## 2023-04-08 ENCOUNTER — Other Ambulatory Visit: Payer: Self-pay

## 2023-04-08 ENCOUNTER — Emergency Department (HOSPITAL_COMMUNITY)
Admission: EM | Admit: 2023-04-08 | Discharge: 2023-04-08 | Disposition: A | Discharge status: Home / Self Care | Attending: Emergency Medicine | Admitting: Emergency Medicine

## 2023-04-08 DIAGNOSIS — R0602 Shortness of breath: Secondary | ICD-10-CM | POA: Diagnosis present

## 2023-04-08 DIAGNOSIS — J441 Chronic obstructive pulmonary disease with (acute) exacerbation: Secondary | ICD-10-CM | POA: Insufficient documentation

## 2023-04-08 LAB — CBC WITH DIFFERENTIAL/PLATELET
Abs Immature Granulocytes: 0.03 10*3/uL (ref 0.00–0.07)
Basophils Absolute: 0 10*3/uL (ref 0.0–0.1)
Basophils Relative: 1 %
Eosinophils Absolute: 0 10*3/uL (ref 0.0–0.5)
Eosinophils Relative: 0 %
HCT: 25.9 % — ABNORMAL LOW (ref 39.0–52.0)
Hemoglobin: 8.2 g/dL — ABNORMAL LOW (ref 13.0–17.0)
Immature Granulocytes: 1 %
Lymphocytes Relative: 26 %
Lymphs Abs: 0.9 10*3/uL (ref 0.7–4.0)
MCH: 27.7 pg (ref 26.0–34.0)
MCHC: 31.7 g/dL (ref 30.0–36.0)
MCV: 87.5 fL (ref 80.0–100.0)
Monocytes Absolute: 1.3 10*3/uL — ABNORMAL HIGH (ref 0.1–1.0)
Monocytes Relative: 37 %
Neutro Abs: 1.2 10*3/uL — ABNORMAL LOW (ref 1.7–7.7)
Neutrophils Relative %: 35 %
Platelets: 290 10*3/uL (ref 150–400)
RBC: 2.96 MIL/uL — ABNORMAL LOW (ref 4.22–5.81)
RDW: 20.1 % — ABNORMAL HIGH (ref 11.5–15.5)
WBC: 3.5 10*3/uL — ABNORMAL LOW (ref 4.0–10.5)
nRBC: 9.7 % — ABNORMAL HIGH (ref 0.0–0.2)

## 2023-04-08 LAB — BRAIN NATRIURETIC PEPTIDE: B Natriuretic Peptide: 135.9 pg/mL — ABNORMAL HIGH (ref 0.0–100.0)

## 2023-04-08 LAB — COMPREHENSIVE METABOLIC PANEL WITH GFR
ALT: 11 U/L (ref 0–44)
AST: 19 U/L (ref 15–41)
Albumin: 2.7 g/dL — ABNORMAL LOW (ref 3.5–5.0)
Alkaline Phosphatase: 45 U/L (ref 38–126)
Anion gap: 11 (ref 5–15)
BUN: 14 mg/dL (ref 8–23)
CO2: 21 mmol/L — ABNORMAL LOW (ref 22–32)
Calcium: 8 mg/dL — ABNORMAL LOW (ref 8.9–10.3)
Chloride: 99 mmol/L (ref 98–111)
Creatinine, Ser: 0.86 mg/dL (ref 0.61–1.24)
GFR, Estimated: >60 mL/min (ref >60)
Glucose, Bld: 122 mg/dL — ABNORMAL HIGH (ref 70–99)
Potassium: 3.4 mmol/L — ABNORMAL LOW (ref 3.5–5.1)
Sodium: 131 mmol/L — ABNORMAL LOW (ref 135–145)
Total Bilirubin: 0.9 mg/dL (ref 0.0–1.2)
Total Protein: 6.3 g/dL — ABNORMAL LOW (ref 6.5–8.1)

## 2023-04-08 LAB — RESP PANEL BY RT-PCR (RSV, FLU A&B, COVID)  RVPGX2
Influenza A by PCR: NEGATIVE
Influenza B by PCR: NEGATIVE
Resp Syncytial Virus by PCR: NEGATIVE
SARS Coronavirus 2 by RT PCR: NEGATIVE

## 2023-04-08 MED ORDER — PREDNISONE 20 MG PO TABS: 40.0000 mg | ORAL_TABLET | Freq: Every day | ORAL | 0 refills | Status: AC

## 2023-04-08 MED ORDER — IPRATROPIUM-ALBUTEROL 0.5-2.5 (3) MG/3ML IN SOLN
6.0000 mL | Freq: Once | RESPIRATORY_TRACT | Status: AC
  Administered 2023-04-08: 6 mL via RESPIRATORY_TRACT
  Filled 2023-04-08: qty 3

## 2023-04-08 MED ORDER — MAGNESIUM SULFATE 2 GM/50ML IV SOLN
2.0000 g | Freq: Once | INTRAVENOUS | Status: AC
  Administered 2023-04-08: 2 g via INTRAVENOUS
  Filled 2023-04-08: qty 50

## 2023-04-08 MED ORDER — METHYLPREDNISOLONE SODIUM SUCC 125 MG IJ SOLR
125.0000 mg | Freq: Once | INTRAMUSCULAR | Status: AC
  Administered 2023-04-08: 125 mg via INTRAVENOUS
  Filled 2023-04-08: qty 2

## 2023-04-08 MED ORDER — ALBUTEROL SULFATE HFA 108 (90 BASE) MCG/ACT IN AERS
1.0000 | INHALATION_SPRAY | RESPIRATORY_TRACT | Status: DC
Start: 2023-04-09 — End: 2023-04-09
  Filled 2023-04-08: qty 6.7

## 2023-04-08 NOTE — ED Provider Notes (Signed)
 [place] EMERGENCY DEPARTMENT AT [facility] Provider Note   CSN: [id number] Arrival date & time: 04/08/23  2047     History Chief Complaint  Patient presents with   Weakness   Shortness of Breath   Epistaxis    HPI Terry Page. is a 68 y.o. male presenting for sob/weakness and epistaxis. States that he has been having SOB/Fatigue today. Endorses leg swellling. 2 brief nosebleeds today.  Has metastatic adenocarcinoma of the lung  Chemo: 03/09/2023: Cycle 5 Day 1 of Docetaxel/Ramucircumab  Patient's recorded medical, surgical, social, medication list and allergies were reviewed in the Snapshot window as part of the initial history.   Review of Systems   Review of Systems  Constitutional:  Positive for fatigue. Negative for chills and fever.  HENT:  Negative for ear pain and sore throat.   Eyes:  Negative for pain and visual disturbance.  Respiratory:  Positive for shortness of breath. Negative for cough.   Cardiovascular:  Negative for chest pain and palpitations.  Gastrointestinal:  Negative for abdominal pain and vomiting.  Genitourinary:  Negative for dysuria and hematuria.  Musculoskeletal:  Negative for arthralgias and back pain.  Skin:  Negative for color change and rash.  Neurological:  Positive for weakness. Negative for seizures and syncope.  All other systems reviewed and are negative.   Physical Exam Updated Vital Signs BP 127/83   Pulse (!) 116   Temp 98.9 F (37.2 C) (Oral)   Resp 19   Ht 5\' 9"  (1.753 m)   Wt 65.8 kg   SpO2 96%   BMI 21.41 kg/m  Physical Exam Vitals and nursing note reviewed.  Constitutional:      General: He is not in acute distress.    Appearance: He is well-developed.  HENT:     Head: Normocephalic and atraumatic.  Eyes:     Conjunctiva/sclera: Conjunctivae normal.  Cardiovascular:     Rate and Rhythm: Normal rate and regular rhythm.     Heart sounds: No murmur heard. Pulmonary:     Effort: Pulmonary  effort is normal. Tachypnea present. No respiratory distress.     Breath sounds: Wheezing and rhonchi present.  Abdominal:     Palpations: Abdomen is soft.     Tenderness: There is no abdominal tenderness.  Musculoskeletal:        General: No swelling.     Cervical back: Neck supple.  Skin:    General: Skin is warm and dry.     Capillary Refill: Capillary refill takes less than 2 seconds.  Neurological:     Mental Status: He is alert.  Psychiatric:        Mood and Affect: Mood normal.      ED Course/ Medical Decision Making/ A&P    Procedures Procedures   Medications Ordered in ED Medications  albuterol (VENTOLIN HFA) 108 (90 Base) MCG/ACT inhaler 1-2 puff (has no administration in time range)  methylPREDNISolone sodium succinate (SOLU-MEDROL) 125 mg/2 mL injection 125 mg (125 mg Intravenous Given 04/08/23 2117)  ipratropium-albuterol (DUONEB) 0.5-2.5 (3) MG/3ML nebulizer solution 6 mL (6 mLs Nebulization Given 04/08/23 2119)  magnesium sulfate IVPB 2 g 50 mL ( Intravenous Infusion Verify 04/08/23 2142)   Medical Decision Making:   [name]. is a 68 y.o. male with a history of COPD, who presented to the ED today with acute on chronic SOB. They are endorsing worsening of their baseline dyspnea over the past 48 hours. 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 At their baseline they are able to get around the neighborhood and they are not able to at this time.   On my initial exam, the pt was SOB and tachypneic. Audible wheezing and grossly decreased breath sounds appreciated.  They are endorsing increased sputum production.    Reviewed and confirmed nursing documentation for past medical history, family history, social history.    Initial Assessment:   With the patient's presentation of SOB in the above setting, most likely diagnosis is COPD Exacerbation. Other diagnoses were considered including (but not limited to) CAP, PE, ACS, viral infection, PTX. These are considered  less likely due to history of present illness and physical exam findings.   This is most consistent with an acute life/limb threatening illness complicated by underlying chronic conditions.  Initial Plan:  Empiric treatment of patient's symptoms with immediate initiation of inhaled bronchodilators and IV steroids. Given advanced nature of patient's presentation, will proceed with IV magnesium as a rescue therapy.   Evaluation for ACS with EKG and delta troponin  Evaluation for infectious versus intrathoracic abnormality with chest x-ray  Evaluation for volume overload with BNP  Screening labs including CBC and Metabolic panel to evaluate for infectious or metabolic etiology of disease.  Patient's Wells score is low and patient does not warrant further objective evaluation for PE based on consistency of presentation of alternative diagnosis.  Objective evaluation as below reviewed   Initial Study Results:   Laboratory  All laboratory results reviewed without evidence of clinically relevant pathology.     EKG EKG was reviewed independently. Rate, rhythm, axis, intervals all examined and without medically relevant abnormality. ST segments without concerns for elevations.    Radiology:  All images reviewed independently. Agree with radiology report at this time.   DG Chest Portable 1 View Result Date: 04/08/2023 CLINICAL DATA:  Altered mental status.  Shortness of breath. EXAM: PORTABLE CHEST 1 VIEW COMPARISON:  Radiograph 03/22/2023, CT 02/15/2023 FINDINGS: Right chest port remains in place. Advanced emphysema. Stable radiographic appearance of left upper post treatment related change. No evidence of acute airspace disease. No pneumothorax or large pleural effusion. No pulmonary edema. Stable heart size and mediastinal contours. Right costophrenic angle is excluded from the field of view. IMPRESSION: 1. Advanced emphysema. No acute findings. 2. Stable post treatment related change in the left  upper lobe. Electronically Signed   By: Narda Rutherford M.D.   On: 04/08/2023 21:48   DG Chest Portable 1 View Result Date: 03/22/2023 CLINICAL DATA:  Nosebleed and diarrhea EXAM: PORTABLE CHEST 1 VIEW COMPARISON:  CT chest dated 02/15/2023 chest radiograph dated 08/02/2021 FINDINGS: Lines/tubes: Right chest wall port tip projects over the superior cavoatrial junction. Lungs: Left upper lobe scarring in keeping with posttreatment changes. No focal consolidation. Pleura: No pneumothorax or pleural effusion. Heart/mediastinum: The heart size and mediastinal contours are within normal limits. Bones: No acute osseous abnormality. Similar heterogeneous appearance of the left scapula. IMPRESSION: 1.  No focal consolidations. 2. Left upper lobe scarring in keeping with posttreatment changes. Electronically Signed   By: Agustin Cree M.D.   On: 03/22/2023 14:11    Final Assessment and Plan:   After initiation of medical therapies, patient is grossly improved and no longer in acute distress.    Disposition:  I have considered need for hospitalization, however, considering all of the above, I believe this patient is stable for discharge at this time.  Patient/family educated about specific return precautions for given chief complaint and symptoms.  Patient/family educated about follow-up with PCP.     Patient/family expressed understanding of return precautions and need for follow-up. Patient spoken to regarding all imaging and laboratory results and appropriate follow up for these results. All education provided in verbal form with additional information in written form. Time was allowed for answering of patient questions. Patient discharged.    Emergency Department Medication Summary:   Medications  albuterol (VENTOLIN HFA) 108 (90 Base) MCG/ACT inhaler 1-2 puff (has no administration in time range)  methylPREDNISolone sodium succinate (SOLU-MEDROL) 125 mg/2 mL injection 125 mg (125 mg Intravenous Given  04/08/23 2117)  ipratropium-albuterol (DUONEB) 0.5-2.5 (3) MG/3ML nebulizer solution 6 mL (6 mLs Nebulization Given 04/08/23 2119)  magnesium sulfate IVPB 2 g 50 mL ( Intravenous Infusion Verify 04/08/23 2142)       Clinical Impression:  1. COPD exacerbation [facility])      Discharge   Final Clinical Impression(s) / ED Diagnoses Final diagnoses:  COPD exacerbation ([name])    Rx / DC Orders ED Discharge Orders          Ordered    predniSONE (DELTASONE) 20 MG tablet  Daily        04/08/23 2222              Glyn Ade, MD 04/08/23 2242

## 2023-04-08 NOTE — ED Triage Notes (Signed)
 BIB EMS from home for weakness, SOB, multiple nose bleeds x today, and R ankle swelling x 2 days. Hx of lung cancer, on chemo here. 89% on RA, placed on 2L [place] with EMS, normally on RA. 5OO mL of NS with EMS. 18g in L wrist. BGL 158 with EMS.

## 2023-04-08 NOTE — Discharge Instructions (Addendum)
 You were seen today for SOB. You were having a COPD exacerbation that is responding to our medications. Please use your inhaler every 4-6 hours as needed tomorrow, take the prescribed steroid and follow up with your PCP.

## 2023-04-13 ENCOUNTER — Telehealth: Payer: Self-pay

## 2023-04-13 DIAGNOSIS — C7951 Secondary malignant neoplasm of bone: Secondary | ICD-10-CM

## 2023-04-13 NOTE — Patient Outreach (Signed)
 Submitted faxed referral for assistance.  [facility]  Population Health Care Management Assistant  Direct Dial: [phone]  Fax: [phone] Website: [email or web address]

## 2023-04-20 ENCOUNTER — Emergency Department (HOSPITAL_COMMUNITY)

## 2023-04-20 ENCOUNTER — Inpatient Hospital Stay: Payer: Medicare HMO | Attending: Hematology and Oncology | Admitting: Physician Assistant

## 2023-04-20 ENCOUNTER — Inpatient Hospital Stay (HOSPITAL_BASED_OUTPATIENT_CLINIC_OR_DEPARTMENT_OTHER): Admitting: Nurse Practitioner

## 2023-04-20 ENCOUNTER — Inpatient Hospital Stay: Payer: Medicare HMO

## 2023-04-20 ENCOUNTER — Inpatient Hospital Stay

## 2023-04-20 ENCOUNTER — Other Ambulatory Visit: Payer: Self-pay

## 2023-04-20 ENCOUNTER — Encounter: Payer: Self-pay | Admitting: Nurse Practitioner

## 2023-04-20 ENCOUNTER — Inpatient Hospital Stay (HOSPITAL_COMMUNITY)
Admission: EM | Admit: 2023-04-20 | Discharge: 2023-04-23 | DRG: 064 | Disposition: A | Discharge status: Home Health | Source: Ambulatory Visit | Attending: Family Medicine | Admitting: Family Medicine

## 2023-04-20 VITALS — BP 125/81 | HR 104 | Temp 97.9°F | Resp 16 | Ht 69.0 in | Wt 142.9 lb

## 2023-04-20 DIAGNOSIS — E871 Hypo-osmolality and hyponatremia: Secondary | ICD-10-CM | POA: Insufficient documentation

## 2023-04-20 DIAGNOSIS — T451X5D Adverse effect of antineoplastic and immunosuppressive drugs, subsequent encounter: Secondary | ICD-10-CM | POA: Insufficient documentation

## 2023-04-20 DIAGNOSIS — L8990 Pressure ulcer of unspecified site, unspecified stage: Secondary | ICD-10-CM | POA: Insufficient documentation

## 2023-04-20 DIAGNOSIS — R530 Neoplastic (malignant) related fatigue: Secondary | ICD-10-CM | POA: Diagnosis not present

## 2023-04-20 DIAGNOSIS — I639 Cerebral infarction, unspecified: Secondary | ICD-10-CM | POA: Diagnosis not present

## 2023-04-20 DIAGNOSIS — C7931 Secondary malignant neoplasm of brain: Secondary | ICD-10-CM | POA: Diagnosis present

## 2023-04-20 DIAGNOSIS — R4789 Other speech disturbances: Secondary | ICD-10-CM

## 2023-04-20 DIAGNOSIS — C7951 Secondary malignant neoplasm of bone: Secondary | ICD-10-CM | POA: Diagnosis present

## 2023-04-20 DIAGNOSIS — Z95828 Presence of other vascular implants and grafts: Secondary | ICD-10-CM

## 2023-04-20 DIAGNOSIS — R630 Anorexia: Secondary | ICD-10-CM | POA: Diagnosis not present

## 2023-04-20 DIAGNOSIS — C3490 Malignant neoplasm of unspecified part of unspecified bronchus or lung: Secondary | ICD-10-CM

## 2023-04-20 DIAGNOSIS — Z87891 Personal history of nicotine dependence: Secondary | ICD-10-CM | POA: Insufficient documentation

## 2023-04-20 DIAGNOSIS — I63542 Cerebral infarction due to unspecified occlusion or stenosis of left cerebellar artery: Secondary | ICD-10-CM | POA: Diagnosis not present

## 2023-04-20 DIAGNOSIS — D72829 Elevated white blood cell count, unspecified: Secondary | ICD-10-CM | POA: Insufficient documentation

## 2023-04-20 DIAGNOSIS — C61 Malignant neoplasm of prostate: Secondary | ICD-10-CM | POA: Diagnosis present

## 2023-04-20 DIAGNOSIS — Z515 Encounter for palliative care: Secondary | ICD-10-CM | POA: Diagnosis not present

## 2023-04-20 DIAGNOSIS — I10 Essential (primary) hypertension: Secondary | ICD-10-CM | POA: Insufficient documentation

## 2023-04-20 DIAGNOSIS — R4701 Aphasia: Secondary | ICD-10-CM | POA: Diagnosis present

## 2023-04-20 DIAGNOSIS — D509 Iron deficiency anemia, unspecified: Secondary | ICD-10-CM | POA: Insufficient documentation

## 2023-04-20 DIAGNOSIS — I63541 Cerebral infarction due to unspecified occlusion or stenosis of right cerebellar artery: Secondary | ICD-10-CM

## 2023-04-20 DIAGNOSIS — R262 Difficulty in walking, not elsewhere classified: Secondary | ICD-10-CM

## 2023-04-20 DIAGNOSIS — G893 Neoplasm related pain (acute) (chronic): Secondary | ICD-10-CM | POA: Insufficient documentation

## 2023-04-20 DIAGNOSIS — E876 Hypokalemia: Secondary | ICD-10-CM | POA: Diagnosis present

## 2023-04-20 DIAGNOSIS — L89613 Pressure ulcer of right heel, stage 3: Secondary | ICD-10-CM | POA: Diagnosis present

## 2023-04-20 DIAGNOSIS — Z5111 Encounter for antineoplastic chemotherapy: Secondary | ICD-10-CM | POA: Insufficient documentation

## 2023-04-20 DIAGNOSIS — R4182 Altered mental status, unspecified: Secondary | ICD-10-CM | POA: Diagnosis present

## 2023-04-20 DIAGNOSIS — E785 Hyperlipidemia, unspecified: Secondary | ICD-10-CM | POA: Diagnosis present

## 2023-04-20 DIAGNOSIS — F17200 Nicotine dependence, unspecified, uncomplicated: Secondary | ICD-10-CM | POA: Diagnosis present

## 2023-04-20 DIAGNOSIS — D649 Anemia, unspecified: Secondary | ICD-10-CM

## 2023-04-20 DIAGNOSIS — C3412 Malignant neoplasm of upper lobe, left bronchus or lung: Secondary | ICD-10-CM

## 2023-04-20 DIAGNOSIS — Z79899 Other long term (current) drug therapy: Secondary | ICD-10-CM

## 2023-04-20 DIAGNOSIS — D630 Anemia in neoplastic disease: Secondary | ICD-10-CM | POA: Diagnosis present

## 2023-04-20 DIAGNOSIS — J439 Emphysema, unspecified: Secondary | ICD-10-CM | POA: Diagnosis present

## 2023-04-20 DIAGNOSIS — I679 Cerebrovascular disease, unspecified: Secondary | ICD-10-CM | POA: Diagnosis present

## 2023-04-20 DIAGNOSIS — Z923 Personal history of irradiation: Secondary | ICD-10-CM

## 2023-04-20 DIAGNOSIS — J4489 Other specified chronic obstructive pulmonary disease: Secondary | ICD-10-CM | POA: Diagnosis present

## 2023-04-20 DIAGNOSIS — D6481 Anemia due to antineoplastic chemotherapy: Secondary | ICD-10-CM | POA: Insufficient documentation

## 2023-04-20 DIAGNOSIS — R531 Weakness: Secondary | ICD-10-CM

## 2023-04-20 DIAGNOSIS — R234 Changes in skin texture: Secondary | ICD-10-CM | POA: Insufficient documentation

## 2023-04-20 DIAGNOSIS — L608 Other nail disorders: Secondary | ICD-10-CM | POA: Insufficient documentation

## 2023-04-20 DIAGNOSIS — R471 Dysarthria and anarthria: Secondary | ICD-10-CM | POA: Diagnosis present

## 2023-04-20 DIAGNOSIS — Z79891 Long term (current) use of opiate analgesic: Secondary | ICD-10-CM | POA: Insufficient documentation

## 2023-04-20 DIAGNOSIS — Z885 Allergy status to narcotic agent status: Secondary | ICD-10-CM

## 2023-04-20 DIAGNOSIS — Z1152 Encounter for screening for COVID-19: Secondary | ICD-10-CM

## 2023-04-20 DIAGNOSIS — L89623 Pressure ulcer of left heel, stage 3: Secondary | ICD-10-CM | POA: Diagnosis present

## 2023-04-20 LAB — I-STAT CHEM 8, ED
BUN: 12 mg/dL (ref 8–23)
Calcium, Ion: 1.18 mmol/L (ref 1.15–1.40)
Chloride: 99 mmol/L (ref 98–111)
Creatinine, Ser: 0.9 mg/dL (ref 0.61–1.24)
Glucose, Bld: 126 mg/dL — ABNORMAL HIGH (ref 70–99)
HCT: 31 % — ABNORMAL LOW (ref 39.0–52.0)
Hemoglobin: 10.5 g/dL — ABNORMAL LOW (ref 13.0–17.0)
Potassium: 3.4 mmol/L — ABNORMAL LOW (ref 3.5–5.1)
Sodium: 133 mmol/L — ABNORMAL LOW (ref 135–145)
TCO2: 23 mmol/L (ref 22–32)

## 2023-04-20 LAB — TSH: TSH: 0.984 u[IU]/mL (ref 0.350–4.500)

## 2023-04-20 LAB — ETHANOL: Alcohol, Ethyl (B): <10 mg/dL (ref <10)

## 2023-04-20 LAB — URINALYSIS, ROUTINE W REFLEX MICROSCOPIC
Bilirubin Urine: NEGATIVE
Glucose, UA: NEGATIVE mg/dL
Hgb urine dipstick: NEGATIVE
Ketones, ur: NEGATIVE mg/dL
Leukocytes,Ua: NEGATIVE
Nitrite: NEGATIVE
Protein, ur: NEGATIVE mg/dL
Specific Gravity, Urine: >1.046 — ABNORMAL HIGH (ref 1.005–1.030)
pH: 6 (ref 5.0–8.0)

## 2023-04-20 LAB — CBC WITH DIFFERENTIAL (CANCER CENTER ONLY)
Abs Immature Granulocytes: 0.11 10*3/uL — ABNORMAL HIGH (ref 0.00–0.07)
Basophils Absolute: 0 10*3/uL (ref 0.0–0.1)
Basophils Relative: 0 %
Eosinophils Absolute: 0 10*3/uL (ref 0.0–0.5)
Eosinophils Relative: 0 %
HCT: 31.3 % — ABNORMAL LOW (ref 39.0–52.0)
Hemoglobin: 10.2 g/dL — ABNORMAL LOW (ref 13.0–17.0)
Immature Granulocytes: 1 %
Lymphocytes Relative: 7 %
Lymphs Abs: 0.7 10*3/uL (ref 0.7–4.0)
MCH: 27.5 pg (ref 26.0–34.0)
MCHC: 32.6 g/dL (ref 30.0–36.0)
MCV: 84.4 fL (ref 80.0–100.0)
Monocytes Absolute: 0.3 10*3/uL (ref 0.1–1.0)
Monocytes Relative: 3 %
Neutro Abs: 8.3 10*3/uL — ABNORMAL HIGH (ref 1.7–7.7)
Neutrophils Relative %: 89 %
Platelet Count: 300 10*3/uL (ref 150–400)
RBC: 3.71 MIL/uL — ABNORMAL LOW (ref 4.22–5.81)
RDW: 21.7 % — ABNORMAL HIGH (ref 11.5–15.5)
WBC Count: 9.4 10*3/uL (ref 4.0–10.5)
nRBC: 0 % (ref 0.0–0.2)

## 2023-04-20 LAB — CBC
HCT: 33.5 % — ABNORMAL LOW (ref 39.0–52.0)
Hemoglobin: 10.4 g/dL — ABNORMAL LOW (ref 13.0–17.0)
MCH: 27.5 pg (ref 26.0–34.0)
MCHC: 31 g/dL (ref 30.0–36.0)
MCV: 88.6 fL (ref 80.0–100.0)
Platelets: 305 10*3/uL (ref 150–400)
RBC: 3.78 MIL/uL — ABNORMAL LOW (ref 4.22–5.81)
RDW: 22.1 % — ABNORMAL HIGH (ref 11.5–15.5)
WBC: 9.6 10*3/uL (ref 4.0–10.5)
nRBC: 0 % (ref 0.0–0.2)

## 2023-04-20 LAB — RESP PANEL BY RT-PCR (RSV, FLU A&B, COVID)  RVPGX2
Influenza A by PCR: NEGATIVE
Influenza B by PCR: NEGATIVE
Resp Syncytial Virus by PCR: NEGATIVE
SARS Coronavirus 2 by RT PCR: NEGATIVE

## 2023-04-20 LAB — CMP (CANCER CENTER ONLY)
ALT: 13 U/L (ref 0–44)
AST: 13 U/L — ABNORMAL LOW (ref 15–41)
Albumin: 3.4 g/dL — ABNORMAL LOW (ref 3.5–5.0)
Alkaline Phosphatase: 57 U/L (ref 38–126)
Anion gap: 8 (ref 5–15)
BUN: 13 mg/dL (ref 8–23)
CO2: 24 mmol/L (ref 22–32)
Calcium: 8.8 mg/dL — ABNORMAL LOW (ref 8.9–10.3)
Chloride: 101 mmol/L (ref 98–111)
Creatinine: 0.83 mg/dL (ref 0.61–1.24)
GFR, Estimated: >60 mL/min (ref >60)
Glucose, Bld: 135 mg/dL — ABNORMAL HIGH (ref 70–99)
Potassium: 3.8 mmol/L (ref 3.5–5.1)
Sodium: 133 mmol/L — ABNORMAL LOW (ref 135–145)
Total Bilirubin: 0.4 mg/dL (ref 0.0–1.2)
Total Protein: 6.8 g/dL (ref 6.5–8.1)

## 2023-04-20 LAB — DIFFERENTIAL
Abs Immature Granulocytes: 0.13 10*3/uL — ABNORMAL HIGH (ref 0.00–0.07)
Basophils Absolute: 0 10*3/uL (ref 0.0–0.1)
Basophils Relative: 0 %
Eosinophils Absolute: 0 10*3/uL (ref 0.0–0.5)
Eosinophils Relative: 0 %
Immature Granulocytes: 1 %
Lymphocytes Relative: 8 %
Lymphs Abs: 0.8 10*3/uL (ref 0.7–4.0)
Monocytes Absolute: 0.4 10*3/uL (ref 0.1–1.0)
Monocytes Relative: 4 %
Neutro Abs: 8.3 10*3/uL — ABNORMAL HIGH (ref 1.7–7.7)
Neutrophils Relative %: 87 %

## 2023-04-20 LAB — COMPREHENSIVE METABOLIC PANEL WITH GFR
ALT: 16 U/L (ref 0–44)
AST: 17 U/L (ref 15–41)
Albumin: 2.9 g/dL — ABNORMAL LOW (ref 3.5–5.0)
Alkaline Phosphatase: 56 U/L (ref 38–126)
Anion gap: 8 (ref 5–15)
BUN: 14 mg/dL (ref 8–23)
CO2: 22 mmol/L (ref 22–32)
Calcium: 8.7 mg/dL — ABNORMAL LOW (ref 8.9–10.3)
Chloride: 101 mmol/L (ref 98–111)
Creatinine, Ser: 0.86 mg/dL (ref 0.61–1.24)
GFR, Estimated: >60 mL/min (ref >60)
Glucose, Bld: 148 mg/dL — ABNORMAL HIGH (ref 70–99)
Potassium: 3.5 mmol/L (ref 3.5–5.1)
Sodium: 131 mmol/L — ABNORMAL LOW (ref 135–145)
Total Bilirubin: 0.7 mg/dL (ref 0.0–1.2)
Total Protein: 6.7 g/dL (ref 6.5–8.1)

## 2023-04-20 LAB — RAPID URINE DRUG SCREEN, HOSP PERFORMED
Amphetamines: NOT DETECTED
Barbiturates: NOT DETECTED
Benzodiazepines: NOT DETECTED
Cocaine: NOT DETECTED
Opiates: NOT DETECTED
Tetrahydrocannabinol: NOT DETECTED

## 2023-04-20 LAB — SAMPLE TO BLOOD BANK

## 2023-04-20 LAB — PROTIME-INR
INR: 1.1 (ref 0.8–1.2)
Prothrombin Time: 14.1 s (ref 11.4–15.2)

## 2023-04-20 LAB — CBG MONITORING, ED: Glucose-Capillary: 121 mg/dL — ABNORMAL HIGH (ref 70–99)

## 2023-04-20 LAB — LACTIC ACID, PLASMA: Lactic Acid, Venous: 1.5 mmol/L (ref 0.5–1.9)

## 2023-04-20 LAB — APTT: aPTT: 30 s (ref 24–36)

## 2023-04-20 MED ORDER — FOLIC ACID 1 MG PO TABS
1.0000 mg | ORAL_TABLET | Freq: Every day | ORAL | Status: DC
  Administered 2023-04-21 – 2023-04-23 (×3): 1 mg via ORAL
  Filled 2023-04-20 (×3): qty 1

## 2023-04-20 MED ORDER — TRAMADOL HCL 50 MG PO TABS
50.0000 mg | ORAL_TABLET | Freq: Four times a day (QID) | ORAL | Status: DC | PRN
  Administered 2023-04-21 – 2023-04-23 (×2): 100 mg via ORAL
  Filled 2023-04-20 (×2): qty 2

## 2023-04-20 MED ORDER — IPRATROPIUM-ALBUTEROL 0.5-2.5 (3) MG/3ML IN SOLN: 3.0000 mL | Freq: Four times a day (QID) | RESPIRATORY_TRACT | Status: DC | PRN

## 2023-04-20 MED ORDER — ACETAMINOPHEN 325 MG PO TABS
650.0000 mg | ORAL_TABLET | Freq: Four times a day (QID) | ORAL | Status: DC | PRN
  Administered 2023-04-21 – 2023-04-22 (×2): 650 mg via ORAL
  Filled 2023-04-20 (×2): qty 2

## 2023-04-20 MED ORDER — METOPROLOL TARTRATE 5 MG/5ML IV SOLN: 5.0000 mg | Freq: Four times a day (QID) | INTRAVENOUS | Status: DC | PRN

## 2023-04-20 MED ORDER — HYDROXYZINE HCL 25 MG PO TABS
25.0000 mg | ORAL_TABLET | Freq: Every evening | ORAL | Status: DC | PRN
  Administered 2023-04-21: 25 mg via ORAL
  Filled 2023-04-20 (×2): qty 1

## 2023-04-20 MED ORDER — SENNOSIDES-DOCUSATE SODIUM 8.6-50 MG PO TABS
2.0000 | ORAL_TABLET | Freq: Two times a day (BID) | ORAL | Status: DC
  Filled 2023-04-20 (×2): qty 2

## 2023-04-20 MED ORDER — ONDANSETRON HCL 4 MG PO TABS: 8.0000 mg | ORAL_TABLET | Freq: Three times a day (TID) | ORAL | Status: DC | PRN

## 2023-04-20 MED ORDER — FENTANYL CITRATE PF 50 MCG/ML IJ SOSY: 12.5000 ug | PREFILLED_SYRINGE | INTRAMUSCULAR | Status: DC | PRN

## 2023-04-20 MED ORDER — ASPIRIN 325 MG PO TABS
325.0000 mg | ORAL_TABLET | Freq: Every day | ORAL | Status: DC
  Administered 2023-04-21 – 2023-04-23 (×3): 325 mg via ORAL
  Filled 2023-04-20 (×3): qty 1

## 2023-04-20 MED ORDER — SODIUM CHLORIDE 0.9% FLUSH: 3.0000 mL | INTRAVENOUS | Status: DC | PRN

## 2023-04-20 MED ORDER — IRBESARTAN 300 MG PO TABS
300.0000 mg | ORAL_TABLET | Freq: Every day | ORAL | Status: DC
  Administered 2023-04-21: 300 mg via ORAL
  Filled 2023-04-20 (×3): qty 1

## 2023-04-20 MED ORDER — IOHEXOL 350 MG/ML SOLN
75.0000 mL | Freq: Once | INTRAVENOUS | Status: AC | PRN
  Administered 2023-04-20: 75 mL via INTRAVENOUS

## 2023-04-20 MED ORDER — DRONABINOL 5 MG PO CAPS
5.0000 mg | ORAL_CAPSULE | Freq: Two times a day (BID) | ORAL | Status: DC
Start: 2023-04-21 — End: 2023-04-23
  Administered 2023-04-21 – 2023-04-22 (×3): 5 mg via ORAL
  Filled 2023-04-20 (×4): qty 1

## 2023-04-20 MED ORDER — STROKE: EARLY STAGES OF RECOVERY BOOK
Freq: Once | Status: AC
  Filled 2023-04-20: qty 1

## 2023-04-20 MED ORDER — POLYETHYLENE GLYCOL 3350 17 G PO PACK: 17.0000 g | PACK | Freq: Every day | ORAL | Status: DC | PRN

## 2023-04-20 MED ORDER — INSULIN ASPART 100 UNIT/ML IJ SOLN
0.0000 [IU] | INTRAMUSCULAR | Status: DC
  Filled 2023-04-20: qty 0.09

## 2023-04-20 MED ORDER — SODIUM CHLORIDE 0.9 % IV SOLN: 250.0000 mL | INTRAVENOUS | Status: AC | PRN

## 2023-04-20 MED ORDER — IPRATROPIUM-ALBUTEROL 0.5-2.5 (3) MG/3ML IN SOLN
3.0000 mL | Freq: Once | RESPIRATORY_TRACT | Status: AC
  Administered 2023-04-20: 3 mL via RESPIRATORY_TRACT
  Filled 2023-04-20: qty 3

## 2023-04-20 MED ORDER — GADOBUTROL 1 MMOL/ML IV SOLN
6.0000 mL | Freq: Once | INTRAVENOUS | Status: AC | PRN
Start: 2023-04-20 — End: 2023-04-20
  Administered 2023-04-20: 6 mL via INTRAVENOUS

## 2023-04-20 MED ORDER — IBUPROFEN 200 MG PO TABS: 400.0000 mg | ORAL_TABLET | Freq: Four times a day (QID) | ORAL | Status: DC | PRN

## 2023-04-20 MED ORDER — ATORVASTATIN CALCIUM 10 MG PO TABS: 20.0000 mg | ORAL_TABLET | Freq: Every day | ORAL | Status: DC

## 2023-04-20 MED ORDER — SODIUM CHLORIDE 0.9% FLUSH
3.0000 mL | Freq: Two times a day (BID) | INTRAVENOUS | Status: DC
  Administered 2023-04-21 – 2023-04-23 (×6): 3 mL via INTRAVENOUS

## 2023-04-20 MED ORDER — DEXAMETHASONE 4 MG PO TABS: 4.0000 mg | ORAL_TABLET | Freq: Every day | ORAL | Status: DC

## 2023-04-20 MED ORDER — ENOXAPARIN SODIUM 40 MG/0.4ML IJ SOSY
40.0000 mg | PREFILLED_SYRINGE | INTRAMUSCULAR | Status: DC
Start: 2023-04-21 — End: 2023-04-23
  Administered 2023-04-21 – 2023-04-23 (×3): 40 mg via SUBCUTANEOUS
  Filled 2023-04-20 (×3): qty 0.4

## 2023-04-20 MED ORDER — ASPIRIN 300 MG RE SUPP
300.0000 mg | Freq: Every day | RECTAL | Status: DC
  Filled 2023-04-20 (×2): qty 1

## 2023-04-20 MED ORDER — SODIUM CHLORIDE 0.9% FLUSH
10.0000 mL | Freq: Once | INTRAVENOUS | Status: AC
Start: 2023-04-20 — End: 2023-04-20
  Administered 2023-04-20: 10 mL

## 2023-04-20 MED ORDER — TRAZODONE HCL 50 MG PO TABS
25.0000 mg | ORAL_TABLET | Freq: Every evening | ORAL | Status: DC | PRN
  Filled 2023-04-20: qty 1

## 2023-04-20 NOTE — ED Provider Notes (Signed)
 [place] EMERGENCY DEPARTMENT AT [facility] Provider Note   CSN: [id number] Arrival date & time: 04/20/23  1034     History  Chief Complaint  Patient presents with   Aphasia    Pt arrives from cancer center with complaints of ongoing weakness, sobx1 week. Per CC, pt has slurred speech and favoring L side, no c/o chest pain    Terry Page. is a 68 y.o. 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 Patient also endorses rhinorrhea, cough, and mild DOE which he states is normal for his seasonal allergies and he is taking allergy medication for this. Patient was at Oncology appointment today were the provider was concerned about possible CVA vs brain mets causing his symptoms - so they referred patient to the ED. Patient denies fever, nausea, vomiting, diarrhea, hematuria, dysuria. Patient stating that he is able to walk with his cane.  Patient later telling provider that he has noticed a change in his voice recently as well.    HPI     Home Medications Prior to Admission medications   Medication Sig Start Date End Date Taking? Authorizing Provider  acetaminophen  (TYLENOL ) 325 MG tablet Take 2 tablets (650 mg total) by mouth every 6 (six) hours as needed for mild pain or headache (fever >/= 101). 05/10/20   Arrien, Curlee Doss, MD  amLODipine  (NORVASC ) 10 MG tablet Take 1 tablet by mouth daily. 06/14/20   [provider]  atorvastatin  (LIPITOR) 20 MG tablet Take 20 mg by mouth daily. 01/28/19   [provider]  cyanocobalamin  (VITAMIN B12) 1000 MCG tablet Take 1 tablet (1,000 mcg total) by mouth daily. 04/10/22   Thayil, Irene T, PA-C  dexamethasone  (DECADRON ) 4 MG tablet Take 2 tabs by mouth 2 times daily starting day before chemo. Then take 2 tabs daily for 2 days starting day after chemo. Take with food. 02/26/23   Thayil, Irene T, PA-C   doxycycline  (VIBRA -TABS) 100 MG tablet Take 1 tablet (100 mg total) by mouth 2 (two) times daily. 02/26/23   Thayil, Irene T, PA-C  dronabinol  (MARINOL ) 5 MG capsule Take 1 capsule (5 mg total) by mouth 2 (two) times daily before lunch and supper. 03/30/23   Pickenpack-Cousar, Giles Labrum, NP  ferrous sulfate  325 (65 FE) MG EC tablet Take 1 tablet (325 mg total) by mouth daily with breakfast. 03/10/22   Wyline Hearing T, PA-C  folic acid  (FOLVITE ) 1 MG tablet Take 1 tablet (1 mg total) by mouth daily. 03/09/23   Thayil, Irene T, PA-C  hydrOXYzine  (ATARAX ) 25 MG tablet Take 1 tablet (25 mg total) by mouth at bedtime as needed for itching. 09/01/21   Icard, Lucie Ruts, DO  ipratropium-albuterol  (DUONEB) 0.5-2.5 (3) MG/3ML SOLN Take 3 mLs by nebulization every 6 (six) hours as needed. Patient taking differently: Take 3 mLs by nebulization every 6 (six) hours as needed (shortness of breathing or wheezing). 05/10/20   Arrien, Curlee Doss, MD  lidocaine  (LIDODERM ) 5 % Place 1 patch onto the skin daily. Remove & Discard patch within 12 hours or as directed by MD. Apply to low back 05/07/21   Rai, Ripudeep K, MD  lidocaine -prilocaine  (EMLA ) cream Apply 1 Application topically as needed. 06/21/21   Dorsey, John T IV, MD  losartan (COZAAR) 100 MG tablet Take 100 mg by mouth daily. 05/22/21   [provider]  olmesartan (BENICAR) 40 MG tablet  Take 40 mg by mouth daily. 03/03/21   [provider]  ondansetron  (ZOFRAN ) 8 MG tablet Take 1 tablet (8 mg total) by mouth every 8 (eight) hours as needed for nausea or vomiting. 11/29/22   Dorsey, John T IV, MD  polyethylene glycol (MIRALAX ) 17 g packet Take 17 g by mouth daily. 07/07/22   Pickenpack-Cousar, Athena N, NP  potassium chloride  SA (KLOR-CON  M) 20 MEQ tablet TAKE 1 TABLET(20 MEQ) BY MOUTH DAILY 02/21/23   Dorsey, John T IV, MD  prochlorperazine  (COMPAZINE ) 10 MG tablet Take 1 tablet (10 mg total) by mouth every 6 (six) hours as needed for nausea or vomiting.  11/29/22   Ander Bame, MD  senna-docusate (STIMULANT LAXATIVE) 8.6-50 MG tablet Take 2 tablets by mouth 2 (two) times daily. May add additional tablet as needed 11/29/22   Dorsey, John T IV, MD  sodium chloride  1 g tablet Take 1 tablet (1 g total) by mouth 3 (three) times daily with meals. 03/30/23   Ander Bame, MD  thiamine  100 MG tablet Take 1 tablet (100 mg total) by mouth daily. 05/08/21   Rai, Hurman Maiden, MD  traMADol  (ULTRAM ) 50 MG tablet Take 1-2 tablets (50-100 mg total) by mouth every 6 (six) hours as needed. 03/26/23   Pickenpack-Cousar, Giles Labrum, NP      Allergies    Morphine  and Oxycodone     Review of Systems   Review of Systems  Neurological:  Positive for weakness.    Physical Exam Updated Vital Signs BP (!) 156/96   Pulse 97   Temp (!) 97.4 F (36.3 C)   Resp 20   SpO2 100%  Physical Exam Vitals and nursing note reviewed.  Constitutional:      General: He is not in acute distress.    Appearance: He is not ill-appearing or toxic-appearing.  HENT:     Head: Normocephalic and atraumatic.     Mouth/Throat:     Mouth: Mucous membranes are moist.  Eyes:     General: No scleral icterus.       Right eye: No discharge.        Left eye: No discharge.     Conjunctiva/sclera: Conjunctivae normal.  Cardiovascular:     Rate and Rhythm: Normal rate and regular rhythm.     Pulses: Normal pulses.     Heart sounds: Normal heart sounds. No murmur heard. Pulmonary:     Effort: Pulmonary effort is normal. No respiratory distress.     Breath sounds: Rhonchi present. No wheezing or rales.     Comments: Mild diffuse rhonchi. No tachypnea or respiratory distress. Abdominal:     General: Abdomen is flat. Bowel sounds are normal. There is no distension.     Palpations: Abdomen is soft. There is no mass.     Tenderness: There is no abdominal tenderness.  Musculoskeletal:     Right lower leg: No edema.     Left lower leg: No edema.  Skin:    General: Skin is warm and  dry.     Findings: No rash.  Neurological:     General: No focal deficit present.     Mental Status: He is alert and oriented to person, place, and time. Mental status is at baseline.     Comments: GCS 15. Speech is goal oriented. No deficits appreciated to CN III-XII; symmetric eyebrow raise, no facial drooping, tongue midline. Patient has equal grip strength bilaterally with 5/5 strength against resistance in all  major muscle groups bilaterally. Sensation to light touch intact. Patient moves extremities without ataxia.   Psychiatric:        Mood and Affect: Mood normal.        Behavior: Behavior normal.     ED Results / Procedures / Treatments   Labs (all labs ordered are listed, but only abnormal results are displayed) Labs Reviewed  CBG MONITORING, ED - Abnormal; Notable for the following components:      Result Value   Glucose-Capillary 121 (*)    All other components within normal limits  CULTURE, BLOOD (ROUTINE X 2)  CULTURE, BLOOD (ROUTINE X 2)  RESP PANEL BY RT-PCR (RSV, FLU A&B, COVID)  RVPGX2  ETHANOL  PROTIME-INR  APTT  CBC  DIFFERENTIAL  COMPREHENSIVE METABOLIC PANEL WITH GFR  RAPID URINE DRUG SCREEN, HOSP PERFORMED  URINALYSIS, ROUTINE W REFLEX MICROSCOPIC  LACTIC ACID, PLASMA  LACTIC ACID, PLASMA  I-STAT CHEM 8, ED    EKG None  Radiology No results found.  Procedures Procedures    Medications Ordered in ED Medications - No data to display  ED Course/ Medical Decision Making/ A&P                                 Medical Decision Making Amount and/or Complexity of Data Reviewed Labs: ordered. Radiology: ordered.  Risk Prescription drug management.   This patient presents to the ED for concern of weakness, this involves an extensive number of treatment options, and is a complaint that carries with it a high risk of complications and morbidity.  The differential diagnosis includes Ischemic stroke, intracerebral hemorrhage, subarachnoid  hemorrhage, Guillain-Barr syndrome, hypoglycemia, electrolyte abnormality, sepsis, ACS, carbon monoxide poisoning, anemia, dehydration.   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 Assisted with transfer and report given to Donelda Fujita, Engineer, drilling). No family present with patient. "   Problem List / ED Course / Critical interventions / Medication management  Patient presents to ED with vague complaints of generalized weakness and slow speech. Also later stating that he noticed a change in his voice recently. Patient with recent cough, rhinorrhea, and mild DOE which he states is chronic for his seasonal allergies and he has been taking allergy medications for this. Patient denies any other recent infectious symptoms. Physical exam with mild rhonchi in BL lungs. Rest of physical exam reassuring. Patient afebrile with stable vitals. I Ordered, and personally interpreted labs.  EtOH within normal limits.  APTT/PT/INR within normal limits.  CBG 121.  Lactic acid within normal limits.  Respiratory panel negative.  CMP with mild hypokalemia at 131.  CBC without leukocytosis.  There is mild anemia with hemoglobin at 10.4.  Blood cultures pending. UA pending. The patient was maintained on a cardiac monitor.  I personally viewed and interpreted the EKG/cardiac monitored which showed an underlying rhythm of: sinus rhythm I ordered imaging studies including CT head, chest xray, CT soft tissue/chest. Chest xray without acute process. CT head showing Nonspecific 12 mm lucent focus within the left parietal calvarium and recommends MRI for further  evaluation which I have ordered. Rest of imaging  pending at this time. Providing patient with Duoneb for his COPD and seasonal allergies with mild DOE. I have reviewed the patients home medicines and have made adjustments as needed   Social Determinants of Health:  Geriatric, chemotherapy patient   3PM Care of Shamel Germond. transferred to PA Roemhildt at the end of my shift as the patient will require reassessment once labs/imaging have resulted. Patient presentation, ED course, and plan of care discussed with review of all pertinent labs and imaging. Please see his/her note for further details regarding further ED course and disposition. Plan at time of handoff is reassess patient after CT imaging. This may be altered or completely changed at the discretion of the oncoming team pending results of further workup.         Final Clinical Impression(s) / ED Diagnoses Final diagnoses:  Primary cancer of left upper lobe of lung [facility])    Rx / DC Orders ED Discharge Orders          Ordered    Infusion Appointment Request        04/20/23 1104    Clinic Appointment Request        04/20/23 1104    Lab Appointment Request        04/20/23 1104    CBC with Differential (Cancer Center Only)        04/20/23 1104    CMP (Cancer Center only)        04/20/23 1104    T4        04/20/23 1104    TSH        04/20/23 1104    Total Protein, Urine dipstick        04/20/23 1104              [place], [place] 04/20/23 1512    Lind Repine, MD 04/23/23 1026

## 2023-04-20 NOTE — ED Notes (Signed)
 Resting comfortably, states his urine attempt was a "false alarm" will bring blankets per request

## 2023-04-20 NOTE — ED Notes (Signed)
 Bladder scan showed 269mL's present.

## 2023-04-20 NOTE — ED Notes (Signed)
 Patient denies pain and is resting comfortably.

## 2023-04-20 NOTE — ED Notes (Signed)
 Pt in nad at this time, given blanket per request

## 2023-04-20 NOTE — ED Notes (Signed)
 Pt given food by provider is in nad at this time

## 2023-04-20 NOTE — ED Notes (Signed)
 Patient is resting comfortably.

## 2023-04-20 NOTE — ED Notes (Signed)
 Notified provider that we have tried to cath pt twice to no success, need urology consult

## 2023-04-20 NOTE — ED Provider Notes (Signed)
 I provided a substantive portion of the care of this patient.  I personally made/approved the management plan for this patient and take responsibility for the patient management.  EKG Interpretation Date/Time:  Friday April 20 2023 10:40:31 EDT Ventricular Rate:  93 PR Interval:  135 QRS Duration:  89 QT Interval:  374 QTC Calculation: 466 R Axis:   -14  Text Interpretation: Sinus rhythm Ventricular premature complex No significant change since last tracing Confirmed by Lind Repine ([id number]) on 04/20/2023 1:26:09 PM  Patient's EKG per interpretation shows normal sinus rhythm.  Patient here for complaint of intermittent issues with his speech.  Patient feels that when he talks sometimes the speech becomes garbled.  Denies any trouble swallowing.  Workup here so far including head CT shows possible intracranial lesion.  Will order MRI at this time.   Lind Repine, MD 04/20/23 [phone]

## 2023-04-20 NOTE — ED Notes (Signed)
 Pt ambulated with steady gait to bathroom and obtained urine sample

## 2023-04-20 NOTE — Progress Notes (Signed)
 Palliative Medicine [facility]  Telephone:([phone]) [phone] [phone]) [phone]   Name: Terry Page. Date: 04/20/2023 MRN: [id number]  DOB: 08/23/1955  Patient Care Team: Dorena Gander, MD as PCP - General (Family Medicine)    INTERVAL HISTORY: Terry Page. is a 68 y.o. 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 He is currently undergoing chemotherapy. Palliative ask to see for symptom management.   SOCIAL HISTORY:     reports that he quit smoking about 3 years ago. His smoking use included cigarettes. He has never used smokeless tobacco. He reports current alcohol use of about 12.0 standard drinks of alcohol per week. He reports that he does not use drugs.  ADVANCE DIRECTIVES:  None on file   CODE STATUS: Full code  PAST MEDICAL HISTORY: Past Medical History:  Diagnosis Date   Alcohol abuse    Asthma    Bullous emphysema ([name])    Essential hypertension 04/10/2020   Hemorrhoids    History of radiation therapy 04/08/20-04/19/20   IMRT- Left lung- Dr. Retta Caster   Incidental lung nodule, greater than or equal to 8mm 10/22/2017   Left upper lobe - discovered on CTA   Prostate cancer ([name])    Spontaneous pneumothorax 10/20/2017   right   Tobacco abuse     ALLERGIES:  is allergic to morphine  and oxycodone .  MEDICATIONS:  Current Outpatient Medications  Medication Sig Dispense Refill   acetaminophen  (TYLENOL ) 325 MG tablet Take 2 tablets (650 mg total) by mouth every 6 (six) hours as needed for mild pain or headache (fever >/= 101).     amLODipine  (NORVASC ) 10 MG tablet Take 1 tablet by mouth daily.     atorvastatin  (LIPITOR) 20 MG tablet Take 20 mg by mouth daily.     cyanocobalamin  (VITAMIN B12) 1000 MCG tablet Take 1 tablet (1,000 mcg total) by mouth daily. 90 tablet 1   dexamethasone   (DECADRON ) 4 MG tablet Take 2 tabs by mouth 2 times daily starting day before chemo. Then take 2 tabs daily for 2 days starting day after chemo. Take with food. 30 tablet 1   doxycycline  (VIBRA -TABS) 100 MG tablet Take 1 tablet (100 mg total) by mouth 2 (two) times daily. 14 tablet 0   dronabinol  (MARINOL ) 5 MG capsule Take 1 capsule (5 mg total) by mouth 2 (two) times daily before lunch and supper. 60 capsule 0   ferrous sulfate  325 (65 FE) MG EC tablet Take 1 tablet (325 mg total) by mouth daily with breakfast. 30 tablet 3   folic acid  (FOLVITE ) 1 MG tablet Take 1 tablet (1 mg total) by mouth daily. 90 tablet 3   hydrOXYzine  (ATARAX ) 25 MG tablet Take 1 tablet (25 mg total) by mouth at bedtime as needed for itching. 30 tablet 11   ipratropium-albuterol  (DUONEB) 0.5-2.5 (3) MG/3ML SOLN Take 3 mLs by nebulization every 6 (six) hours as needed. (Patient taking differently: Take 3 mLs by nebulization every 6 (six) hours as needed (shortness of breathing or wheezing).) 360 mL 0   lidocaine  (LIDODERM ) 5 % Place 1 patch onto the skin daily. Remove & Discard patch within 12 hours or as directed by MD. Apply to low back 30 patch 0   lidocaine -prilocaine  (EMLA ) cream Apply 1 Application topically as needed. 30 g 0   losartan (COZAAR) 100 MG tablet Take 100 mg by  mouth daily.     olmesartan (BENICAR) 40 MG tablet Take 40 mg by mouth daily.     ondansetron  (ZOFRAN ) 8 MG tablet Take 1 tablet (8 mg total) by mouth every 8 (eight) hours as needed for nausea or vomiting. 30 tablet 1   polyethylene glycol (MIRALAX ) 17 g packet Take 17 g by mouth daily. 30 each 2   potassium chloride  SA (KLOR-CON  M) 20 MEQ tablet TAKE 1 TABLET(20 MEQ) BY MOUTH DAILY 90 tablet 0   prochlorperazine  (COMPAZINE ) 10 MG tablet Take 1 tablet (10 mg total) by mouth every 6 (six) hours as needed for nausea or vomiting. 30 tablet 1   senna-docusate (STIMULANT LAXATIVE) 8.6-50 MG tablet Take 2 tablets by mouth 2 (two) times daily. May add  additional tablet as needed 150 tablet 3   sodium chloride  1 g tablet Take 1 tablet (1 g total) by mouth 3 (three) times daily with meals. 90 tablet 3   thiamine  100 MG tablet Take 1 tablet (100 mg total) by mouth daily.     traMADol  (ULTRAM ) 50 MG tablet Take 1-2 tablets (50-100 mg total) by mouth every 6 (six) hours as needed. 120 tablet 0   No current facility-administered medications for this visit.    VITAL SIGNS: BP 125/81 (BP Location: Left Arm, Patient Position: Sitting)   Pulse (!) 104 Comment: nurse is aware  Temp 97.9 F (36.6 C) (Temporal)   Resp 16   Ht 5\' 9"  (1.753 m)   Wt 142 lb 14.4 oz (64.8 kg)   SpO2 100%   BMI 21.10 kg/m  Filed Weights   04/20/23 0953  Weight: 142 lb 14.4 oz (64.8 kg)    Estimated body mass index is 21.1 kg/m as calculated from the following:   Height as of this encounter: 5\' 9"  (1.753 m).   Weight as of this encounter: 142 lb 14.4 oz (64.8 kg).   PERFORMANCE STATUS (ECOG) : 1 - Symptomatic but completely ambulatory  Physical Exam General: Fatigue  HEENT: eyes watery, drooping Cardiovascular: Tachycardia  Pulmonary: dyspnea with exertion and talking, diminished Extremities: no edema, no joint deformities, weakness Skin: no rashes, dry  Neurological: AAO x3  IMPRESSION: Discussed the use of AI scribe software for clinical note transcription with the patient, who gave verbal consent to proceed.  History of Present Illness Terry Page. is a 68 year old male with metastatic lung cancer with bone involvement currently undergoing palliative chemo. He presents to clinic today for symptom management follow-up prior to scheduled treatment. Patient is complaining of increased weakness, shortness of breath, eye watering which he attributes to allergies, and just not "feeling well" overall. Taking daily allergy medicine but states his seasonal allergies are typically mild.  Reports today is worst than yesterday. He describes breathing a  little harder than usual. No dizziness when standing or walking. On exam noticeable increase in weakness compared to previous visits. At times he is slow to speak or express himself. Speech seems somewhat impaired compared to patient's known normal. He also mentions experiencing some weakness and slowness in walking, as well as feeling 'a little slower' and having a hoarse voice". He is unable to state timeline of symptom onset stating he has not felt well over the past 2-3 days. Denies concerns of nausea, vomiting, diarrhea, visual changes. At baseline is ambulatory with cane.   Mr. Rabine reports increase in his back pain over the past week. Feels pain is controlled with Tramadol . for which he requires pain  medication, although he did not specify the type of medication he is currently using. Additionally, his feet hurt, and he needs to prop them up to avoid touching the bed due to discomfort. No adjustments to regimen at this time.   Given new onset of weakness and impaired speech after evaluation by myself and Delores Fester, [place] patient transported to the ED for further work-up to rule out CVA vs cancer related brain metastasis. Assisted with transfer and report given to Donelda Fujita, Engineer, drilling). No family present with patient.   I discussed the importance of continued conversation with family and their medical providers regarding overall plan of care and treatment options, ensuring decisions are within the context of the patients values and GOCs. Assessment & Plan Slurred speech and weakness Symptoms persist despite normal neurological exam, indicating potential underlying issue. - Transport to emergency department for further evaluation as discussed with Delores Fester, [place]. Rule out CVA versus brain metastasis.   Cancer related pain/Back pain Reports pain is well controlled. No adjustments at this time.  - Tramadol  50-100mg  every 6 hours as needed -Lidocaine  patch to back as needed.   Allergic symptoms Symptoms  consistent with allergies, exacerbated despite current antihistamine use. - Continue current antihistamine regimen.  I will plan to see patient back in 1-2 weeks. Sooner if needed.   Patient expressed understanding and was in agreement with this plan. He also understands that He can call the clinic at any time with any questions, concerns, or complaints.   Any controlled substances utilized were prescribed in the context of palliative care. PDMP has been reviewed.   Visit consisted of counseling and education dealing with the complex and emotionally intense issues of symptom management and palliative care in the setting of serious and potentially life-threatening illness.  Dellia Ferguson, AGPCNP-BC  Palliative Medicine Team/[facility]

## 2023-04-20 NOTE — ED Notes (Signed)
 Given blankets and readjusted in bed per request

## 2023-04-20 NOTE — H&P (Signed)
 History and Physical    Patient: Terry Page. ZOX:[id number] DOB: Oct 27, 1955 DOA: 04/20/2023 DOS: the patient was seen and examined on 04/21/2023 PCP: Dorena Gander, MD  Patient coming from: Home  Chief Complaint:  Chief Complaint  Patient presents with   Aphasia    Pt arrives from cancer center with complaints of ongoing weakness, sobx1 week. Per CC, pt has slurred speech and favoring L side, no c/o chest pain   HPI: Terry Page. is a 68 y.o. 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  He presented for his usual chemotherapy today and was noted to have weakness for the last week as well as slurred speech.  He was sent to the ED for workup of stroke versus brain mets.  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  Neurology was consulted and asked for the patient to be admitted to [facility].   Patient reports bilateral heel sores.  He reports that he pulled off some scabs there is unclear with the initial insult was and they were wrapped up tonight he did not want them examined.  Review of Systems: As mentioned in the history of present illness. All other systems reviewed and are negative. Past Medical History:  Diagnosis Date   Alcohol abuse    Asthma    Bullous emphysema ([name])    Essential hypertension 04/10/2020   Hemorrhoids    History of radiation therapy 04/08/20-04/19/20   IMRT- Left lung- Dr. Retta Caster   Incidental lung nodule, greater than or equal to 8mm 10/22/2017   Left upper lobe - discovered on CTA   Prostate cancer [facility])    Spontaneous pneumothorax 10/20/2017   right   Tobacco abuse    Past Surgical History:  Procedure Laterality Date   BACK SURGERY     IR IMAGING GUIDED PORT INSERTION  06/29/2021   PROSTATE BIOPSY     Social History:  reports that he quit smoking about  3 years ago. His smoking use included cigarettes. He has never used smokeless tobacco. He reports current alcohol use of about 12.0 standard drinks of alcohol per week. He reports that he does not use drugs.  Allergies  Allergen Reactions   Morphine  Itching   Oxycodone  Itching    Family History  Problem Relation Age of Onset   Cancer Cousin        maternal cousin   Cancer Cousin        paternal cousin   Cancer Cousin    Colon polyps Neg Hx    Pancreatic disease Neg Hx    Pancreatic cancer Neg Hx    Breast cancer Neg Hx    Colon cancer Neg Hx     Prior to Admission medications   Medication Sig Start Date End Date Taking? Authorizing Provider  acetaminophen  (TYLENOL ) 325 MG tablet Take 2 tablets (650 mg total) by mouth every 6 (six) hours as needed for mild pain or headache (fever >/= 101). 05/10/20  Yes Arrien, Mauricio Daniel, MD  atorvastatin  (LIPITOR) 20 MG tablet Take 20 mg by mouth daily. 01/28/19  Yes [provider]  cyanocobalamin  (VITAMIN B12) 1000 MCG tablet Take 1 tablet (1,000 mcg total) by mouth daily. Patient taking differently: Take 1,000 mcg by mouth as needed (When directed by physican). 04/10/22  Yes Thayil, Irene T, PA-C  dexamethasone  (DECADRON ) 4 MG tablet Take 2 tabs by mouth 2 times daily starting day before chemo. Then take 2 tabs daily for 2 days starting day after chemo. Take with food. 02/26/23  Yes Thayil, Irene T, PA-C  dronabinol  (MARINOL ) 5 MG capsule Take 1 capsule (5 mg total) by mouth 2 (two) times daily before lunch and supper. 03/30/23  Yes Pickenpack-Cousar, Giles Labrum, NP  folic acid  (FOLVITE ) 1 MG tablet Take 1 tablet (1 mg total) by mouth daily. 03/09/23  Yes Thayil, Irene T, PA-C  hydrOXYzine  (ATARAX ) 25 MG tablet Take 1 tablet (25 mg total) by mouth at bedtime as needed for itching. 09/01/21  Yes Icard, Bradley L, DO  ipratropium-albuterol  (DUONEB) 0.5-2.5 (3) MG/3ML SOLN Take 3 mLs by nebulization every 6 (six) hours as needed. Patient taking  differently: Take 3 mLs by nebulization every 6 (six) hours as needed (shortness of breathing or wheezing). 05/10/20  Yes Arrien, Curlee Doss, MD  lidocaine  (LIDODERM ) 5 % Place 1 patch onto the skin daily. Remove & Discard patch within 12 hours or as directed by MD. Apply to low back 05/07/21  Yes Rai, Ripudeep K, MD  olmesartan (BENICAR) 40 MG tablet Take 40 mg by mouth daily. 03/03/21  Yes [provider]  ondansetron  (ZOFRAN ) 8 MG tablet Take 1 tablet (8 mg total) by mouth every 8 (eight) hours as needed for nausea or vomiting. 11/29/22  Yes Ander Bame, MD  potassium chloride  SA (KLOR-CON  M) 20 MEQ tablet TAKE 1 TABLET(20 MEQ) BY MOUTH DAILY 02/21/23  Yes Dorsey, John T IV, MD  SENNA S 8.6-50 MG tablet Take 2 tablets by mouth at bedtime as needed for mild constipation or moderate constipation. 11/29/22  Yes [provider]  senna-docusate (STIMULANT LAXATIVE) 8.6-50 MG tablet Take 2 tablets by mouth 2 (two) times daily. May add additional tablet as needed 11/29/22  Yes Dorsey, John T IV, MD  sodium chloride  1 g tablet Take 1 tablet (1 g total) by mouth 3 (three) times daily with meals. 03/30/23  Yes Ander Bame, MD  traMADol  (ULTRAM ) 50 MG tablet Take 1-2 tablets (50-100 mg total) by mouth every 6 (six) hours as needed. 03/26/23  Yes Pickenpack-Cousar, Athena N, NP  amLODipine  (NORVASC ) 10 MG tablet Take 1 tablet by mouth daily. Patient not taking: Reported on 04/20/2023 06/14/20   [provider]  azithromycin  (ZITHROMAX ) 250 MG tablet Take 250 mg by mouth as directed. Patient not taking: Reported on 04/20/2023 04/11/23   [provider]  doxycycline  (VIBRA -TABS) 100 MG tablet Take 1 tablet (100 mg total) by mouth 2 (two) times daily. Patient not taking: Reported on 04/20/2023 02/26/23   Thayil, Irene T, PA-C  ferrous sulfate  325 (65 FE) MG EC tablet Take 1 tablet (325 mg total) by mouth daily with breakfast. Patient not taking: Reported on 04/20/2023 03/10/22    Thayil, Irene T, PA-C  lidocaine -prilocaine  (EMLA ) cream Apply 1 Application topically as needed. Patient not taking: Reported on 04/20/2023 06/21/21   Dorsey, John T IV, MD  losartan (COZAAR) 100 MG tablet Take 100 mg by mouth daily. Patient not taking: Reported on 04/20/2023 05/22/21   [provider]  polyethylene glycol (MIRALAX ) 17 g packet Take 17 g by mouth daily. Patient not taking: Reported on 04/20/2023 07/07/22   Pickenpack-Cousar, Athena N, NP  prochlorperazine  (COMPAZINE ) 10 MG tablet Take 1 tablet (10 mg total) by mouth every 6 (six) hours as needed for nausea  or vomiting. Patient not taking: Reported on 04/20/2023 11/29/22   Ander Bame, MD  thiamine  100 MG tablet Take 1 tablet (100 mg total) by mouth daily. Patient not taking: Reported on 04/20/2023 05/08/21   Loma Rising, MD    Physical Exam: Vitals:   04/20/23 1830 04/20/23 1845 04/20/23 2224 04/21/23 0000  BP: 138/84 (!) 137/98 122/87 138/87  Pulse:   97   Resp: (!) 28  20   Temp:   98.6 F (37 C)   TempSrc:   Oral   SpO2:   100%    Physical Examination: General appearance - alert, well appearing, and in no distress Neck - supple, no significant adenopathy Chest - clear to auscultation, no wheezes, rales or rhonchi, symmetric air entry Heart - normal rate, regular rhythm, normal S1, S2, no murmurs, rubs, clicks or gallops Abdomen - soft, nontender, nondistended, no masses or organomegaly Extremities - pedal edema 1+  Data Reviewed: Results for orders placed or performed during the hospital encounter of 04/20/23 (from the past 24 hours)  CBG monitoring, ED     Status: Abnormal   Collection Time: 04/20/23 10:37 AM  Result Value Ref Range   Glucose-Capillary 121 (H) 70 - 99 mg/dL  Ethanol     Status: None   Collection Time: 04/20/23 10:44 AM  Result Value Ref Range   Alcohol, Ethyl (B) <10 <10 mg/dL  Protime-INR     Status: None   Collection Time: 04/20/23 10:44 AM  Result Value Ref Range    Prothrombin Time 14.1 11.4 - 15.2 seconds   INR 1.1 0.8 - 1.2  APTT     Status: None   Collection Time: 04/20/23 10:44 AM  Result Value Ref Range   aPTT 30 24 - 36 seconds  CBC     Status: Abnormal   Collection Time: 04/20/23 10:44 AM  Result Value Ref Range   WBC 9.6 4.0 - 10.5 K/uL   RBC 3.78 (L) 4.22 - 5.81 MIL/uL   Hemoglobin 10.4 (L) 13.0 - 17.0 g/dL   HCT 81.1 (L) 91.4 - 78.2 %   MCV 88.6 80.0 - 100.0 fL   MCH 27.5 26.0 - 34.0 pg   MCHC 31.0 30.0 - 36.0 g/dL   RDW 95.6 (H) 21.3 - 08.6 %   Platelets 305 150 - 400 K/uL   nRBC 0.0 0.0 - 0.2 %  Differential     Status: Abnormal   Collection Time: 04/20/23 10:44 AM  Result Value Ref Range   Neutrophils Relative % 87 %   Neutro Abs 8.3 (H) 1.7 - 7.7 K/uL   Lymphocytes Relative 8 %   Lymphs Abs 0.8 0.7 - 4.0 K/uL   Monocytes Relative 4 %   Monocytes Absolute 0.4 0.1 - 1.0 K/uL   Eosinophils Relative 0 %   Eosinophils Absolute 0.0 0.0 - 0.5 K/uL   Basophils Relative 0 %   Basophils Absolute 0.0 0.0 - 0.1 K/uL   Immature Granulocytes 1 %   Abs Immature Granulocytes 0.13 (H) 0.00 - 0.07 K/uL  Comprehensive metabolic panel     Status: Abnormal   Collection Time: 04/20/23 10:44 AM  Result Value Ref Range   Sodium 131 (L) 135 - 145 mmol/L   Potassium 3.5 3.5 - 5.1 mmol/L   Chloride 101 98 - 111 mmol/L   CO2 22 22 - 32 mmol/L   Glucose, Bld 148 (H) 70 - 99 mg/dL   BUN 14 8 - 23 mg/dL   Creatinine,  Ser 0.86 0.61 - 1.24 mg/dL   Calcium  8.7 (L) 8.9 - 10.3 mg/dL   Total Protein 6.7 6.5 - 8.1 g/dL   Albumin 2.9 (L) 3.5 - 5.0 g/dL   AST 17 15 - 41 U/L   ALT 16 0 - 44 U/L   Alkaline Phosphatase 56 38 - 126 U/L   Total Bilirubin 0.7 0.0 - 1.2 mg/dL   GFR, Estimated >16 >10 mL/min   Anion gap 8 5 - 15  Resp panel by RT-PCR (RSV, Flu A&B, Covid) Anterior Nasal Swab     Status: None   Collection Time: 04/20/23 11:58 AM   Specimen: Anterior Nasal Swab  Result Value Ref Range   SARS Coronavirus 2 by RT PCR NEGATIVE NEGATIVE    Influenza A by PCR NEGATIVE NEGATIVE   Influenza B by PCR NEGATIVE NEGATIVE   Resp Syncytial Virus by PCR NEGATIVE NEGATIVE  Lactic acid, plasma     Status: None   Collection Time: 04/20/23 12:30 PM  Result Value Ref Range   Lactic Acid, Venous 1.5 0.5 - 1.9 mmol/L  I-stat chem 8, ED     Status: Abnormal   Collection Time: 04/20/23 12:36 PM  Result Value Ref Range   Sodium 133 (L) 135 - 145 mmol/L   Potassium 3.4 (L) 3.5 - 5.1 mmol/L   Chloride 99 98 - 111 mmol/L   BUN 12 8 - 23 mg/dL   Creatinine, Ser 9.60 0.61 - 1.24 mg/dL   Glucose, Bld 454 (H) 70 - 99 mg/dL   Calcium , Ion 1.18 1.15 - 1.40 mmol/L   TCO2 23 22 - 32 mmol/L   Hemoglobin 10.5 (L) 13.0 - 17.0 g/dL   HCT 09.8 (L) 11.9 - 14.7 %  Urine rapid drug screen (hosp performed)     Status: None   Collection Time: 04/20/23 10:03 PM  Result Value Ref Range   Opiates NONE DETECTED NONE DETECTED   Cocaine NONE DETECTED NONE DETECTED   Benzodiazepines NONE DETECTED NONE DETECTED   Amphetamines NONE DETECTED NONE DETECTED   Tetrahydrocannabinol NONE DETECTED NONE DETECTED   Barbiturates NONE DETECTED NONE DETECTED  Urinalysis, Routine w reflex microscopic -Urine, Clean Catch     Status: Abnormal   Collection Time: 04/20/23 10:04 PM  Result Value Ref Range   Color, Urine YELLOW YELLOW   APPearance CLEAR CLEAR   Specific Gravity, Urine >1.046 (H) 1.005 - 1.030   pH 6.0 5.0 - 8.0   Glucose, UA NEGATIVE NEGATIVE mg/dL   Hgb urine dipstick NEGATIVE NEGATIVE   Bilirubin Urine NEGATIVE NEGATIVE   Ketones, ur NEGATIVE NEGATIVE mg/dL   Protein, ur NEGATIVE NEGATIVE mg/dL   Nitrite NEGATIVE NEGATIVE   Leukocytes,Ua NEGATIVE NEGATIVE   MR Brain W and Wo Contrast Result Date: 04/20/2023 CLINICAL DATA:  Mental status change, unknown cause EXAM: MRI HEAD WITHOUT AND WITH CONTRAST TECHNIQUE: Multiplanar, multiecho pulse sequences of the brain and surrounding structures were obtained without and with intravenous contrast. CONTRAST:  6mL  GADAVIST  GADOBUTROL  1 MMOL/ML IV SOLN COMPARISON:  Same day CT head.  MRI head May 4, 23. FINDINGS: Brain: Round, nonenhancing 7 mm focus of mild DWI hyperintensity in the medial left cerebellum (series 5, image 6 and series 8, image 4), which is new from prior. No substantial mass effect. No pathologic enhancement. No definite acute hemorrhage or hydrocephalus. Vascular: Major arterial flow voids are maintained skull base. Skull and upper cervical spine: Normal marrow signal. Sinuses/Orbits: Clear sinuses.  No acute orbital findings. IMPRESSION: Round, nonenhancing  7 mm focus of mild DWI hyperintensity in the medial left cerebellum, which is new from prior. This finding is indeterminate but may represent a recent or subacute infarct. No associated edema or enhancement to suggest metastasis; however, given the clinical history, recommend short interval follow-up MRI head with contrast in 4-6 weeks to ensure expected evolution. Electronically Signed   By: Stevenson Elbe M.D.   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 History of COPD. Generalized weakness and slurred speech. Recent change to voice. EXAM: CT ANGIOGRAPHY CHEST WITH CONTRAST TECHNIQUE: Multidetector CT imaging of the chest was performed using the standard protocol during bolus administration of intravenous contrast. Multiplanar CT image reconstructions and MIPs were obtained to evaluate the vascular anatomy. RADIATION DOSE REDUCTION: This exam was performed according to the departmental dose-optimization program which includes automated exposure control, adjustment of the mA and/or kV according to patient size and/or use of iterative reconstruction technique. CONTRAST:  75mL OMNIPAQUE  IOHEXOL  350 MG/ML SOLN COMPARISON:  Same day chest radiograph and CT 02/15/2023 FINDINGS: Cardiovascular: No pericardial effusion. Negative for  acute pulmonary embolism. Aortic and coronary artery atherosclerotic calcification. Right chest wall Port-A-Cath. Mediastinum/Nodes: Trachea and esophagus are unremarkable. Similar soft tissue thickening about the inferior left hilum compared to prior and likely representing post treatment change. Lungs/Pleura: Advanced paraseptal and centrilobular emphysema with bullous change in the left apex. Diffuse bronchial wall thickening. No pleural effusion or pneumothorax. Increased size of the irregular consolidation in the left apex now measuring 2.6 x 2.0 cm. This measured 2.4 x 1.3 cm on CT 02/15/2023 using similar measuring technique. This may represent progression of post treatment change however residual or recurrent malignancy is not excluded. Multiple pulmonary nodules are redemonstrated and are similar in size to 02/15/2023. Interval increase in size of a subpleural anterior right upper lobe nodule measuring 6 mm nodule (13/108) previously measured 4 mm. Multiple new nodules in the left lower lobe measuring up to 6 mm (series 13/image 288). Upper Abdomen: Stable hypoattenuating lesions in the liver likely representing benign cyst. No acute abnormality in the visualized abdomen. Musculoskeletal: No acute fracture. Similar mixed lytic and sclerotic appearance of the left scapula. Review of the MIP images confirms the above findings. IMPRESSION: 1. Negative for acute pulmonary embolism. 2. Increased size of the irregular consolidation in the left apex now measuring 2.6 x 2.0 cm. This measured 2.4 x 1.3 cm on CT 02/15/2023. This may represent progression of post treatment change however residual or recurrent malignancy is not excluded. Recommend close interval follow-up or PET/CT. 3. Multiple new nodules in the left lower lobe measuring up to 6 mm. Interval increase in size of a subpleural anterior right upper lobe nodule measuring 6 mm previously measured 4 mm. Aortic Atherosclerosis (ICD10-I70.0) and Emphysema  (ICD10-J43.9). Electronically Signed   By: Rozell Cornet M.D.   On: 04/20/2023 19:41   CT Soft Tissue Neck W Contrast Result Date: 04/20/2023 CLINICAL DATA:  Soft tissue infection suspected, generalized weakness and speech difficulty. History of metastatic prostate/lung cancer. EXAM: CT NECK WITH CONTRAST TECHNIQUE: Multidetector CT imaging of the neck was performed using the standard protocol following the bolus administration of intravenous contrast. RADIATION DOSE REDUCTION: This exam was performed according to the departmental dose-optimization program which includes automated exposure control, adjustment of the mA and/or kV according to patient size and/or use of iterative reconstruction technique. CONTRAST:  75mL OMNIPAQUE   IOHEXOL  350 MG/ML SOLN COMPARISON:  None Available. FINDINGS: Pharynx and larynx: Symmetric appearance of the nasopharynx. The palatine tonsils are relatively symmetric. Normal appearance of the oral cavity and floor of mouth. The base of tongue is unremarkable. The epiglottis is normal. No retropharyngeal effusion. There is no evidence of fluid collection or peritonsillar abscess. Symmetric appearance of the aryepiglottic folds and piriform sinuses. Normal appearance of the paraglottic fat. The vocal folds are symmetric. Salivary glands: There is slightly asymmetric hyperattenuation of the right parotid gland which may be artifactual. The parotid glands are relatively symmetric in size. There is no inflammatory stranding adjacent to the right parotid gland. No ductal enlargement or evidence of abnormal calcification. The submandibular glands are symmetric. Thyroid : Normal. Lymph nodes: There are no enlarged lymph nodes in the neck. There is a 1.5 x 0.9 x 0.8 cm cystic appearing focus within the right posterior neck involving cervical level 2 B/5 which demonstrates internal fluid density. No definite associated soft tissue component. Vascular: Moderate atherosclerosis of the visualized  aortic arch. There is prominent atherosclerosis involving the origin of the left subclavian artery resulting in at least moderate stenosis. Additional prominent atherosclerosis of distal left subclavian/proximal left axillary artery. Prominent calcified atherosclerosis involving the bilateral carotid bifurcations. There is likely severe stenosis at the origins of both internal carotid arteries. Additional atherosclerosis of the partially visualized carotid siphons. Right IJ approach central venous catheter noted. Limited intracranial: Negative. Visualized orbits: Negative. Mastoids and visualized paranasal sinuses: Mastoid air cells are clear. Mucosal thickening in the alveolar recesses of the maxillary sinuses. Skeleton: No acute or aggressive finding noted. Degenerative changes throughout the cervical spine. Upper chest: Emphysema. Scarring in the bilateral lung apices and in the left upper lobe. See separately dictated CTA chest for complete evaluation of intrathoracic findings. Other: None. IMPRESSION: No acute abnormality along the aero digestive structures in the neck. No evidence of abscess. Slightly asymmetric attenuation of the right parotid gland which may be artifactual. No adjacent inflammatory stranding. Recommend correlation with tenderness over the right parotid. 1.5 cm cystic focus in the right posterior neck may reflect a lymphangioma/lymphatic malformation. There is no soft tissue component to suggest necrotic lymph node. Recommend short interval follow-up with CT or ultrasound of the neck. Atherosclerosis as above. 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 Consider CTA for further evaluation if clinically indicated. Electronically Signed   By: Denny Flack M.D.   On: 04/20/2023 18:26   DG Chest 2 View Result Date: 04/20/2023 CLINICAL DATA:  Cough and shortness of breath EXAM: CHEST - 2 VIEW COMPARISON:  X-ray 04/08/2023 and older FINDINGS: Stable right IJ chest port with  tip along the central SVC. Hyperinflation with chronic lung changes. No pneumothorax, effusion or edema. Normal cardiopericardial silhouette. Calcified aorta. Overlapping cardiac leads. Apical pleural thickening. Degenerative changes along the spine. IMPRESSION: Hyperinflation with chronic lung changes. Calcified aorta. Right IJ chest port. Electronically Signed   By: Adrianna Horde M.D.   On: 04/20/2023 12:43   CT HEAD WO CONTRAST Result Date: 04/20/2023 CLINICAL DATA:  Provided history: Mental status change, unknown cause. Additional history provided: Stroke-like symptoms. History of lung adenocarcinoma and prostate cancer. EXAM: CT HEAD WITHOUT CONTRAST TECHNIQUE: Contiguous axial images were obtained from the base of the skull through the vertex without intravenous contrast. RADIATION DOSE REDUCTION: This exam was performed according to the departmental dose-optimization program which includes automated exposure control, adjustment of the mA and/or kV according to patient size and/or use of iterative  reconstruction technique. COMPARISON:  Brain MRI 05/05/2021. FINDINGS: Brain: Generalized cerebral atrophy. 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 Patchy and ill-defined hypoattenuation within the cerebral white matter, nonspecific but compatible with mild chronic small vessel ischemic disease. There is no acute intracranial hemorrhage. No demarcated cortical infarct. No extra-axial fluid collection. No evidence of an intracranial mass. No midline shift. Vascular: No hyperdense vessel.  Atherosclerotic calcifications. Skull: No calvarial fracture. 9 x 3 mm osseous protrusion projecting outward from the right frontal calvarium most consistent with an osteoma. 12 mm lucent focus within the left parietal calvarium, extending to the inner table (series 4, image 32) (series 6, image 45). Sinuses/Orbits: No mass or acute finding within the imaged orbits. 6 mm left  frontoethmoidal osteoma. Trace mucosal thickening scattered within the paranasal sinuses at the imaged levels. IMPRESSION: 1. Please note, assessment for intracranial metastatic disease is limited on this non-contrast head CT. 2. No evidence of an acute intracranial abnormality. 3. 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 4. Mild cerebral white matter chronic small vessel ischemic disease. 5. Generalized cerebral atrophy. 6. Nonspecific 12 mm lucent focus within the left parietal calvarium. An osseous metastasis cannot be excluded. Consider a brain MRI (with and without contrast) for further evaluation. Electronically Signed   By: Bascom Lily D.O.   On: 04/20/2023 11:57   DG Chest Portable 1 View Result Date: 04/08/2023 CLINICAL DATA:  Altered mental status.  Shortness of breath. EXAM: PORTABLE CHEST 1 VIEW COMPARISON:  Radiograph 03/22/2023, CT 02/15/2023 FINDINGS: Right chest port remains in place. Advanced emphysema. Stable radiographic appearance of left upper post treatment related change. No evidence of acute airspace disease. No pneumothorax or large pleural effusion. No pulmonary edema. Stable heart size and mediastinal contours. Right costophrenic angle is excluded from the field of view. IMPRESSION: 1. Advanced emphysema. No acute findings. 2. Stable post treatment related change in the left upper lobe. Electronically Signed   By: Chadwick Colonel M.D.   On: 04/08/2023 21:48   DG Chest Portable 1 View Result Date: 03/22/2023 CLINICAL DATA:  Nosebleed and diarrhea EXAM: PORTABLE CHEST 1 VIEW COMPARISON:  CT chest dated 02/15/2023 chest radiograph dated 08/02/2021 FINDINGS: Lines/tubes: Right chest wall port tip projects over the superior cavoatrial junction. Lungs: Left upper lobe scarring in keeping with posttreatment changes. No focal consolidation. Pleura: No pneumothorax or pleural effusion. Heart/mediastinum: The heart size and mediastinal contours are within normal limits.  Bones: No acute osseous abnormality. Similar heterogeneous appearance of the left scapula. IMPRESSION: 1.  No focal consolidations. 2. Left upper lobe scarring in keeping with posttreatment changes. Electronically Signed   By: Limin  Xu M.D.   On: 03/22/2023 14:11     Assessment and Plan: * CVA (cerebral vascular accident) ([name]) Usual stroke workup Lipid panel Carotid artery Dopplers Telemetry Aspirin  Echo  HLD (hyperlipidemia) Continue atorvastatin   Essential hypertension Continue amlodipine , ARB, beta-blocker  Primary cancer of left upper lobe of lung ([name]) Stage IV with brain and bone mets potentially.  MRI was more suggestive of infarct versus metastases though follow-up imaging was recommended. On Tylenol  and tramadol  every 4-6 hours for pain.  Wound eschar of foot Wound/ostomy to evaluate in the morning      Advance Care Planning:   Code Status: Full Code full, confirmed with patient he does not mind initial intubation or CPR but would not want prolonged life sustaining measures.  Consults: Neurology  Family Communication: Patient at bedside  Severity of  Illness: The appropriate patient status for this patient is OBSERVATION. Observation status is judged to be reasonable and necessary in order to provide the required intensity of service to ensure the patient's safety. The patient's presenting symptoms, physical exam findings, and initial radiographic and laboratory data in the context of their medical condition is felt to place them at decreased risk for further clinical deterioration. Furthermore, it is anticipated that the patient will be medically stable for discharge from the hospital within 2 midnights of admission.   Author: Granville Layer, MD 04/21/2023 12:43 AM  For on call review [email or web address].

## 2023-04-20 NOTE — ED Provider Notes (Signed)
 Accepted handoff at shift change from [facility]. Please see prior provider note for full HPI.  Briefly: Patient is a 68 y.o. male who presents to the ER for generalized weakness and slowed speech for the past 2 days.  Also with shortness of breath for the past week.  Sent from oncology office with concern for stroke versus brain metastasis.  Patient with metastatic prostate cancer and lung cancer.  DDX/Plan: Pending imaging.  CT head shows nonspecific 12 mm focus in the left parietum, cannot exclude osseous metastasis.  Radiologist recommended brain with and without.    Pending CT soft tissue as patient's had cough and voice change, pending CT PE study as he has had progressive shortness of breath/dyspnea on exertion and cough, and and pending MRI brain to rule out stroke or brain metastasis.  Plan to reevaluate for disposition after imaging results.  Physical Exam  BP 122/87 (BP Location: Left Arm)   Pulse 97   Temp 98.6 F (37 C) (Oral)   Resp 20   SpO2 100%   Physical Exam Vitals and nursing note reviewed.  Constitutional:      Appearance: Normal appearance.  HENT:     Head: Normocephalic and atraumatic.  Eyes:     Conjunctiva/sclera: Conjunctivae normal.  Cardiovascular:     Rate and Rhythm: Normal rate and regular rhythm.  Pulmonary:     Effort: Pulmonary effort is normal. No respiratory distress.     Breath sounds: Normal breath sounds.  Abdominal:     General: There is no distension.     Palpations: Abdomen is soft.     Tenderness: There is no abdominal tenderness.  Skin:    General: Skin is warm and dry.  Neurological:     General: No focal deficit present.     Mental Status: He is alert.     Comments: Neuro: Speech is clear, able to follow commands. CN III-XII intact grossly intact. PERRLA. EOMI. Sensation intact throughout. Str 5/5 all extremities.    Results  MR Brain W and Wo Contrast Result Date: 04/20/2023 CLINICAL DATA:  Mental status change, unknown  cause EXAM: MRI HEAD WITHOUT AND WITH CONTRAST TECHNIQUE: Multiplanar, multiecho pulse sequences of the brain and surrounding structures were obtained without and with intravenous contrast. CONTRAST:  6mL GADAVIST  GADOBUTROL  1 MMOL/ML IV SOLN COMPARISON:  Same day CT head.  MRI head May 4, 23. FINDINGS: Brain: Round, nonenhancing 7 mm focus of mild DWI hyperintensity in the medial left cerebellum (series 5, image 6 and series 8, image 4), which is new from prior. No substantial mass effect. No pathologic enhancement. No definite acute hemorrhage or hydrocephalus. Vascular: Major arterial flow voids are maintained skull base. Skull and upper cervical spine: Normal marrow signal. Sinuses/Orbits: Clear sinuses.  No acute orbital findings. IMPRESSION: Round, nonenhancing 7 mm focus of mild DWI hyperintensity in the medial left cerebellum, which is new from prior. This finding is indeterminate but may represent a recent or subacute infarct. No associated edema or enhancement to suggest metastasis; however, given the clinical history, recommend short interval follow-up MRI head with contrast in 4-6 weeks to ensure expected evolution. Electronically Signed   By: Gilmore GORMAN Molt M.D.   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 History of COPD. Generalized weakness and slurred speech. Recent change to voice. EXAM: CT ANGIOGRAPHY CHEST WITH CONTRAST TECHNIQUE: Multidetector CT imaging of  the chest was performed using the standard protocol during bolus administration of intravenous contrast. Multiplanar CT image reconstructions and MIPs were obtained to evaluate the vascular anatomy. RADIATION DOSE REDUCTION: This exam was performed according to the departmental dose-optimization program which includes automated exposure control, adjustment of the mA and/or kV according to patient size and/or use of  iterative reconstruction technique. CONTRAST:  75mL OMNIPAQUE  IOHEXOL  350 MG/ML SOLN COMPARISON:  Same day chest radiograph and CT 02/15/2023 FINDINGS: Cardiovascular: No pericardial effusion. Negative for acute pulmonary embolism. Aortic and coronary artery atherosclerotic calcification. Right chest wall Port-A-Cath. Mediastinum/Nodes: Trachea and esophagus are unremarkable. Similar soft tissue thickening about the inferior left hilum compared to prior and likely representing post treatment change. Lungs/Pleura: Advanced paraseptal and centrilobular emphysema with bullous change in the left apex. Diffuse bronchial wall thickening. No pleural effusion or pneumothorax. Increased size of the irregular consolidation in the left apex now measuring 2.6 x 2.0 cm. This measured 2.4 x 1.3 cm on CT 02/15/2023 using similar measuring technique. This may represent progression of post treatment change however residual or recurrent malignancy is not excluded. Multiple pulmonary nodules are redemonstrated and are similar in size to 02/15/2023. Interval increase in size of a subpleural anterior right upper lobe nodule measuring 6 mm nodule (13/108) previously measured 4 mm. Multiple new nodules in the left lower lobe measuring up to 6 mm (series 13/image 288). Upper Abdomen: Stable hypoattenuating lesions in the liver likely representing benign cyst. No acute abnormality in the visualized abdomen. Musculoskeletal: No acute fracture. Similar mixed lytic and sclerotic appearance of the left scapula. Review of the MIP images confirms the above findings. IMPRESSION: 1. Negative for acute pulmonary embolism. 2. Increased size of the irregular consolidation in the left apex now measuring 2.6 x 2.0 cm. This measured 2.4 x 1.3 cm on CT 02/15/2023. This may represent progression of post treatment change however residual or recurrent malignancy is not excluded. Recommend close interval follow-up or PET/CT. 3. Multiple new nodules in the  left lower lobe measuring up to 6 mm. Interval increase in size of a subpleural anterior right upper lobe nodule measuring 6 mm previously measured 4 mm. Aortic Atherosclerosis (ICD10-I70.0) and Emphysema (ICD10-J43.9). Electronically Signed   By: Norman Gatlin M.D.   On: 04/20/2023 19:41   CT Soft Tissue Neck W Contrast Result Date: 04/20/2023 CLINICAL DATA:  Soft tissue infection suspected, generalized weakness and speech difficulty. History of metastatic prostate/lung cancer. EXAM: CT NECK WITH CONTRAST TECHNIQUE: Multidetector CT imaging of the neck was performed using the standard protocol following the bolus administration of intravenous contrast. RADIATION DOSE REDUCTION: This exam was performed according to the departmental dose-optimization program which includes automated exposure control, adjustment of the mA and/or kV according to patient size and/or use of iterative reconstruction technique. CONTRAST:  75mL OMNIPAQUE  IOHEXOL  350 MG/ML SOLN COMPARISON:  None Available. FINDINGS: Pharynx and larynx: Symmetric appearance of the nasopharynx. The palatine tonsils are relatively symmetric. Normal appearance of the oral cavity and floor of mouth. The base of tongue is unremarkable. The epiglottis is normal. No retropharyngeal effusion. There is no evidence of fluid collection or peritonsillar abscess. Symmetric appearance of the aryepiglottic folds and piriform sinuses. Normal appearance of the paraglottic fat. The vocal folds are symmetric. Salivary glands: There is slightly asymmetric hyperattenuation of the right parotid gland which may be artifactual. The parotid glands are relatively symmetric in size. There is no inflammatory stranding adjacent to the right parotid gland. No ductal enlargement or evidence of abnormal calcification.  The submandibular glands are symmetric. Thyroid : Normal. Lymph nodes: There are no enlarged lymph nodes in the neck. There is a 1.5 x 0.9 x 0.8 cm cystic appearing focus  within the right posterior neck involving cervical level 2 B/5 which demonstrates internal fluid density. No definite associated soft tissue component. Vascular: Moderate atherosclerosis of the visualized aortic arch. There is prominent atherosclerosis involving the origin of the left subclavian artery resulting in at least moderate stenosis. Additional prominent atherosclerosis of distal left subclavian/proximal left axillary artery. Prominent calcified atherosclerosis involving the bilateral carotid bifurcations. There is likely severe stenosis at the origins of both internal carotid arteries. Additional atherosclerosis of the partially visualized carotid siphons. Right IJ approach central venous catheter noted. Limited intracranial: Negative. Visualized orbits: Negative. Mastoids and visualized paranasal sinuses: Mastoid air cells are clear. Mucosal thickening in the alveolar recesses of the maxillary sinuses. Skeleton: No acute or aggressive finding noted. Degenerative changes throughout the cervical spine. Upper chest: Emphysema. Scarring in the bilateral lung apices and in the left upper lobe. See separately dictated CTA chest for complete evaluation of intrathoracic findings. Other: None. IMPRESSION: No acute abnormality along the aero digestive structures in the neck. No evidence of abscess. Slightly asymmetric attenuation of the right parotid gland which may be artifactual. No adjacent inflammatory stranding. Recommend correlation with tenderness over the right parotid. 1.5 cm cystic focus in the right posterior neck may reflect a lymphangioma/lymphatic malformation. There is no soft tissue component to suggest necrotic lymph node. Recommend short interval follow-up with CT or ultrasound of the neck. Atherosclerosis as above. 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 Consider CTA for further evaluation if clinically indicated. Electronically Signed   By: Donnice Mania M.D.   On:  04/20/2023 18:26   ED Course / MDM   Clinical Course as of 04/20/23 2319  Fri Apr 20, 2023  1620 Nursing attempted in and out catheter and placement of 18 Fr coude catheter without success. Bladder scan only showed 220 cc in bladder.  [LR]  1848 Called MRI, pt should be going for scan in the next 30 min or so [LR]    Clinical Course User Index [LR] Alejandria Wessells, Friddie DASEN, PA-C   Medical Decision Making Amount and/or Complexity of Data Reviewed Labs: ordered. Radiology: ordered.  Risk Prescription drug management. Decision regarding hospitalization.  Imaging results as follows: - CT soft tissue: 1.5 cm cystic focus in right posterior neck could be lymphangioma/lymphatic malformation, recommend follow up CT or ultrasound of neck - CT PE study: increased side of consolidation in left apex now 2.6 x 2.0 cm, could be post treatment change or residual/recurrent malignancy, recommend follow up PET/CT, and multiple new nodules in left lower lobe - MRI brain: 7 mm focus in medial left cerebellum, could represent recent or subacute infarct; no associated edema/enhancement to suggest metastasis  I consulted with neurologist Dr Merrianne who reviewed patient's images and agrees for admission to hospital for stroke workup.  I consulted with hospitalist Dr Fredirick who will admit to [facility].  The patient appears reasonably stabilized for admission considering the current resources, flow, and capabilities available in the ED at this time, and I doubt any other [facility] requiring further screening and/or treatment in the ED prior to admission.   Maydelin Deming T, PA-C 04/20/23 2319    Kingsley, Victoria K, DO 04/20/23 2333

## 2023-04-21 ENCOUNTER — Encounter (HOSPITAL_COMMUNITY): Payer: Self-pay | Admitting: Family Medicine

## 2023-04-21 ENCOUNTER — Observation Stay (HOSPITAL_COMMUNITY)

## 2023-04-21 ENCOUNTER — Observation Stay (HOSPITAL_BASED_OUTPATIENT_CLINIC_OR_DEPARTMENT_OTHER)

## 2023-04-21 DIAGNOSIS — R0609 Other forms of dyspnea: Secondary | ICD-10-CM | POA: Diagnosis not present

## 2023-04-21 DIAGNOSIS — M79661 Pain in right lower leg: Secondary | ICD-10-CM

## 2023-04-21 DIAGNOSIS — I639 Cerebral infarction, unspecified: Secondary | ICD-10-CM | POA: Diagnosis not present

## 2023-04-21 DIAGNOSIS — R234 Changes in skin texture: Secondary | ICD-10-CM | POA: Insufficient documentation

## 2023-04-21 DIAGNOSIS — I679 Cerebrovascular disease, unspecified: Secondary | ICD-10-CM | POA: Diagnosis present

## 2023-04-21 LAB — BASIC METABOLIC PANEL WITH GFR
Anion gap: 10 (ref 5–15)
BUN: 12 mg/dL (ref 8–23)
CO2: 24 mmol/L (ref 22–32)
Calcium: 8.6 mg/dL — ABNORMAL LOW (ref 8.9–10.3)
Chloride: 99 mmol/L (ref 98–111)
Creatinine, Ser: 0.88 mg/dL (ref 0.61–1.24)
GFR, Estimated: >60 mL/min (ref >60)
Glucose, Bld: 97 mg/dL (ref 70–99)
Potassium: 3.1 mmol/L — ABNORMAL LOW (ref 3.5–5.1)
Sodium: 133 mmol/L — ABNORMAL LOW (ref 135–145)

## 2023-04-21 LAB — LIPID PANEL
Cholesterol: 144 mg/dL (ref 0–200)
HDL: 40 mg/dL — ABNORMAL LOW (ref >40)
LDL Cholesterol: 78 mg/dL (ref 0–99)
Total CHOL/HDL Ratio: 3.6 ratio
Triglycerides: 132 mg/dL (ref <150)
VLDL: 26 mg/dL (ref 0–40)

## 2023-04-21 LAB — ECHOCARDIOGRAM COMPLETE
AR max vel: 2.99 cm2
AV Peak grad: 3.6 mmHg
Ao pk vel: 0.95 m/s
Area-P 1/2: 4.71 cm2
Height: 69 in
MV VTI: 3.9 cm2
S' Lateral: 2.8 cm
Weight: 2286.4 [oz_av]

## 2023-04-21 LAB — CBC
HCT: 31.2 % — ABNORMAL LOW (ref 39.0–52.0)
Hemoglobin: 10 g/dL — ABNORMAL LOW (ref 13.0–17.0)
MCH: 28.4 pg (ref 26.0–34.0)
MCHC: 32.1 g/dL (ref 30.0–36.0)
MCV: 88.6 fL (ref 80.0–100.0)
Platelets: 316 10*3/uL (ref 150–400)
RBC: 3.52 MIL/uL — ABNORMAL LOW (ref 4.22–5.81)
RDW: 22.5 % — ABNORMAL HIGH (ref 11.5–15.5)
WBC: 15.3 10*3/uL — ABNORMAL HIGH (ref 4.0–10.5)
nRBC: 0 % (ref 0.0–0.2)

## 2023-04-21 LAB — T4: T4, Total: 7.3 ug/dL (ref 4.5–12.0)

## 2023-04-21 LAB — HIV ANTIBODY (ROUTINE TESTING W REFLEX): HIV Screen 4th Generation wRfx: NONREACTIVE

## 2023-04-21 MED ORDER — CHLORHEXIDINE GLUCONATE CLOTH 2 % EX PADS
6.0000 | MEDICATED_PAD | Freq: Every day | CUTANEOUS | Status: DC
  Administered 2023-04-22 – 2023-04-23 (×2): 6 via TOPICAL

## 2023-04-21 MED ORDER — SODIUM CHLORIDE 0.9% FLUSH: 10.0000 mL | INTRAVENOUS | Status: DC | PRN

## 2023-04-21 MED ORDER — POTASSIUM CHLORIDE CRYS ER 20 MEQ PO TBCR
40.0000 meq | EXTENDED_RELEASE_TABLET | ORAL | Status: AC
Start: 2023-04-21 — End: 2023-04-21
  Administered 2023-04-21 (×2): 40 meq via ORAL
  Filled 2023-04-21 (×2): qty 2

## 2023-04-21 MED ORDER — IOHEXOL 350 MG/ML SOLN
75.0000 mL | Freq: Once | INTRAVENOUS | Status: AC | PRN
  Administered 2023-04-21: 75 mL via INTRAVENOUS

## 2023-04-21 MED ORDER — ATORVASTATIN CALCIUM 40 MG PO TABS
40.0000 mg | ORAL_TABLET | Freq: Every day | ORAL | Status: DC
  Administered 2023-04-21 – 2023-04-23 (×3): 40 mg via ORAL
  Filled 2023-04-21 (×3): qty 1

## 2023-04-21 MED ORDER — PERFLUTREN LIPID MICROSPHERE
1.0000 mL | INTRAVENOUS | Status: AC | PRN
  Administered 2023-04-21: 2 mL via INTRAVENOUS

## 2023-04-21 NOTE — ED Notes (Signed)
 Patient transported to CT

## 2023-04-21 NOTE — Plan of Care (Signed)
  Problem: Nutritional: Goal: Maintenance of adequate nutrition will improve Outcome: Progressing   Problem: Skin Integrity: Goal: Risk for impaired skin integrity will decrease Outcome: Progressing   Problem: Nutrition: Goal: Risk of aspiration will decrease Outcome: Progressing   Problem: Activity: Goal: Risk for activity intolerance will decrease Outcome: Progressing

## 2023-04-21 NOTE — Consult Note (Signed)
 NEUROLOGY CONSULT NOTE   Date of service: April 21, 2023 Patient Name: Terry Page. MRN:  [id number] DOB:  Aug 19, 1955 Chief Complaint: Acute stroke seen on MRI Requesting Provider: Etter Hermann., *  History of Present Illness  Terry Page. is a 68 y.o. male with a PMHx of EtOH abuse, asthma, bullous emphysema, HTN, metastatic adenocarcinoma of the lung on chemotherapy, left lung nodule s/p radiation therapy in 2022, spontaneous pneumothorax (right) in 2019, prostate cancer radiation therapy and tobacco abuse who presented to the ED via EMS on Friday evening with complaints of weakness, SOB, multiple nose bleeds and right ankle swelling.  The patient was at his Oncology appointment earlier on Friday and the provider was concerned about possible CVA vs brain mets causing his symptoms - so they referred patient to the ED. On arrival to the ED, the patient denied N/V, fever, diarrhea or dysuria. At his baseline, the patient walks with a cane.   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 Neurology was consulted for further recommendations.     ROS  Comprehensive ROS performed and pertinent positives documented in HPI   Past History   Past Medical History:  Diagnosis Date   Alcohol abuse    Asthma    Bullous emphysema ([name])    Essential hypertension 04/10/2020   Hemorrhoids    History of radiation therapy 04/08/20-04/19/20   IMRT- Left lung- Dr. Retta Caster   Incidental lung nodule, greater than or equal to 8mm 10/22/2017   Left upper lobe - discovered on CTA   Prostate cancer ([name])    Spontaneous pneumothorax 10/20/2017   right   Tobacco abuse     Past Surgical History:  Procedure Laterality Date   BACK SURGERY     IR IMAGING GUIDED PORT INSERTION  06/29/2021   PROSTATE BIOPSY      Family History: Family History  Problem Relation Age of Onset   Cancer Cousin        maternal cousin   Cancer Cousin        paternal cousin   Cancer Cousin     Colon polyps Neg Hx    Pancreatic disease Neg Hx    Pancreatic cancer Neg Hx    Breast cancer Neg Hx    Colon cancer Neg Hx     Social History  reports that he quit smoking about 3 years ago. His smoking use included cigarettes. He has never used smokeless tobacco. He reports current alcohol use of about 12.0 standard drinks of alcohol per week. He reports that he does not use drugs.  Allergies  Allergen Reactions   Morphine  Itching   Oxycodone  Itching    Medications   Current Facility-Administered Medications:     stroke: early stages of recovery book, , Does not apply, Once, Granville Layer, MD   0.9 %  sodium chloride  infusion, 250 mL, Intravenous, PRN, Granville Layer, MD   acetaminophen  (TYLENOL ) tablet 650 mg, 650 mg, Oral, Q6H PRN, Granville Layer, MD   aspirin  suppository 300 mg, 300 mg, Rectal, Daily **OR** aspirin  tablet 325 mg, 325 mg, Oral, Daily, Granville Layer, MD   atorvastatin  (LIPITOR) tablet 20 mg, 20 mg, Oral, Daily, Pratt, Tanya S, MD   dexamethasone  (DECADRON ) tablet 4 mg, 4 mg, Oral, Daily, Granville Layer, MD   dronabinol  (MARINOL ) capsule 5 mg, 5 mg, Oral, BID AC, Pratt, Tanya S, MD   enoxaparin  (LOVENOX ) injection 40 mg, 40 mg, Subcutaneous,  Q24H, Granville Layer, MD   fentaNYL  (SUBLIMAZE ) injection 12.5-50 mcg, 12.5-50 mcg, Intravenous, Q2H PRN, Granville Layer, MD   folic acid  (FOLVITE ) tablet 1 mg, 1 mg, Oral, Daily, Granville Layer, MD   hydrOXYzine  (ATARAX ) tablet 25 mg, 25 mg, Oral, QHS PRN, Granville Layer, MD   ibuprofen  (ADVIL ) tablet 400 mg, 400 mg, Oral, Q6H PRN, Granville Layer, MD   ipratropium-albuterol  (DUONEB) 0.5-2.5 (3) MG/3ML nebulizer solution 3 mL, 3 mL, Nebulization, Q6H PRN, Granville Layer, MD   irbesartan  (AVAPRO ) tablet 300 mg, 300 mg, Oral, Daily, Pratt, Tanya S, MD   metoprolol  tartrate (LOPRESSOR ) injection 5 mg, 5 mg, Intravenous, Q6H PRN, Granville Layer, MD   ondansetron  (ZOFRAN ) tablet 8 mg, 8 mg, Oral, Q8H PRN, Pratt, Tanya S, MD    polyethylene glycol (MIRALAX  / GLYCOLAX ) packet 17 g, 17 g, Oral, Daily PRN, Pratt, Tanya S, MD   senna-docusate (Senokot-S) tablet 2 tablet, 2 tablet, Oral, BID, Granville Layer, MD   sodium chloride  flush (NS) 0.9 % injection 3 mL, 3 mL, Intravenous, Q12H, Granville Layer, MD, 3 mL at 04/21/23 0045   sodium chloride  flush (NS) 0.9 % injection 3 mL, 3 mL, Intravenous, PRN, Granville Layer, MD   traMADol  (ULTRAM ) tablet 50-100 mg, 50-100 mg, Oral, Q6H PRN, Granville Layer, MD, 100 mg at 04/21/23 0002   traZODone  (DESYREL ) tablet 25 mg, 25 mg, Oral, QHS PRN, Granville Layer, MD  Current Outpatient Medications:    acetaminophen  (TYLENOL ) 325 MG tablet, Take 2 tablets (650 mg total) by mouth every 6 (six) hours as needed for mild pain or headache (fever >/= 101)., Disp: , Rfl:    atorvastatin  (LIPITOR) 20 MG tablet, Take 20 mg by mouth daily., Disp: , Rfl:    cyanocobalamin  (VITAMIN B12) 1000 MCG tablet, Take 1 tablet (1,000 mcg total) by mouth daily. (Patient taking differently: Take 1,000 mcg by mouth as needed (When directed by physican).), Disp: 90 tablet, Rfl: 1   dexamethasone  (DECADRON ) 4 MG tablet, Take 2 tabs by mouth 2 times daily starting day before chemo. Then take 2 tabs daily for 2 days starting day after chemo. Take with food., Disp: 30 tablet, Rfl: 1   dronabinol  (MARINOL ) 5 MG capsule, Take 1 capsule (5 mg total) by mouth 2 (two) times daily before lunch and supper., Disp: 60 capsule, Rfl: 0   folic acid  (FOLVITE ) 1 MG tablet, Take 1 tablet (1 mg total) by mouth daily., Disp: 90 tablet, Rfl: 3   hydrOXYzine  (ATARAX ) 25 MG tablet, Take 1 tablet (25 mg total) by mouth at bedtime as needed for itching., Disp: 30 tablet, Rfl: 11   ipratropium-albuterol  (DUONEB) 0.5-2.5 (3) MG/3ML SOLN, Take 3 mLs by nebulization every 6 (six) hours as needed. (Patient taking differently: Take 3 mLs by nebulization every 6 (six) hours as needed (shortness of breathing or wheezing).), Disp: 360 mL, Rfl: 0    lidocaine  (LIDODERM ) 5 %, Place 1 patch onto the skin daily. Remove & Discard patch within 12 hours or as directed by MD. Apply to low back, Disp: 30 patch, Rfl: 0   olmesartan (BENICAR) 40 MG tablet, Take 40 mg by mouth daily., Disp: , Rfl:    ondansetron  (ZOFRAN ) 8 MG tablet, Take 1 tablet (8 mg total) by mouth every 8 (eight) hours as needed for nausea or vomiting., Disp: 30 tablet, Rfl: 1   potassium chloride  SA (KLOR-CON  M) 20 MEQ tablet, TAKE 1 TABLET(20 MEQ) BY MOUTH DAILY,  Disp: 90 tablet, Rfl: 0   SENNA S 8.6-50 MG tablet, Take 2 tablets by mouth at bedtime as needed for mild constipation or moderate constipation., Disp: , Rfl:    senna-docusate (STIMULANT LAXATIVE) 8.6-50 MG tablet, Take 2 tablets by mouth 2 (two) times daily. May add additional tablet as needed, Disp: 150 tablet, Rfl: 3   sodium chloride  1 g tablet, Take 1 tablet (1 g total) by mouth 3 (three) times daily with meals., Disp: 90 tablet, Rfl: 3   traMADol  (ULTRAM ) 50 MG tablet, Take 1-2 tablets (50-100 mg total) by mouth every 6 (six) hours as needed., Disp: 120 tablet, Rfl: 0   amLODipine  (NORVASC ) 10 MG tablet, Take 1 tablet by mouth daily. (Patient not taking: Reported on 05/07/23), Disp: , Rfl:    ferrous sulfate  325 (65 FE) MG EC tablet, Take 1 tablet (325 mg total) by mouth daily with breakfast. (Patient not taking: Reported on 05-07-23), Disp: 30 tablet, Rfl: 3   lidocaine -prilocaine  (EMLA ) cream, Apply 1 Application topically as needed. (Patient not taking: Reported on May 07, 2023), Disp: 30 g, Rfl: 0   polyethylene glycol (MIRALAX ) 17 g packet, Take 17 g by mouth daily. (Patient not taking: Reported on May 07, 2023), Disp: 30 each, Rfl: 2   prochlorperazine  (COMPAZINE ) 10 MG tablet, Take 1 tablet (10 mg total) by mouth every 6 (six) hours as needed for nausea or vomiting. (Patient not taking: Reported on 07-May-2023), Disp: 30 tablet, Rfl: 1   thiamine  100 MG tablet, Take 1 tablet (100 mg total) by mouth daily. (Patient not  taking: Reported on 05/07/23), Disp: , Rfl:   Vitals   Vitals:   04/21/23 0000 04/21/23 0100 04/21/23 0420 04/21/23 0530  BP: 138/87 (!) 145/93 (!) 157/91 (!) 152/86  Pulse:  (!) 104 82   Resp:  18 18   Temp:  97.8 F (36.6 C) 98.5 F (36.9 C)   TempSrc:  Oral Oral   SpO2:  100% 97%     There is no height or weight on file to calculate BMI.  Physical Exam   Physical Exam  HEENT-  [place]/AT    Lungs- Respirations unlabored Extremities- No edema  Neurological Examination Mental Status: Alert, oriented x 5, thought content appropriate.  Speech fluent without evidence of aphasia.  Able to follow all commands without difficulty. Cranial Nerves: II: Temporal visual fields intact with no extinction to DSS. PERRL  III,IV, VI: No ptosis. EOMI.  V: Temp sensation equal bilaterally  VII: Smile symmetric VIII: Hearing intact to voice IX,X: No hoarseness XI: Symmetric shoulder shrug XII: Midline tongue extension Motor: BUE 5/5 proximally and distally BLE 5/5 proximally and distally  No pronator drift.  Sensory: Temp and light touch intact throughout, bilaterally. No extinction to DSS.  Deep Tendon Reflexes: Hypoactive throughout Cerebellar: No ataxia with FNF bilaterally. Mild action tremor to BUE.   Gait: Deferred   Labs/Imaging/Neurodiagnostic studies   CBC:  Recent Labs  Lab May 07, 2023 0921 2023-05-07 0921 05/07/23 1044 05-07-23 1236 04/21/23 0554  WBC 9.4  --  9.6  --  15.3*  NEUTROABS 8.3*  --  8.3*  --   --   HGB 10.2*   < > 10.4* 10.5* 10.0*  HCT 31.3*  --  33.5* 31.0* 31.2*  MCV 84.4  --  88.6  --  88.6  PLT 300  --  305  --  316   < > = values in this interval not displayed.   Basic Metabolic Panel:  Lab Results  Component Value  Date   NA 133 (L) 04/20/2023   K 3.4 (L) 04/20/2023   CO2 22 04/20/2023   GLUCOSE 126 (H) 04/20/2023   BUN 12 04/20/2023   CREATININE 0.90 04/20/2023   CALCIUM  8.7 (L) 04/20/2023   GFRNONAA >60 04/20/2023   GFRAA >60 06/06/2019    Lipid Panel: No results found for: "LDLCALC" HgbA1c: No results found for: "HGBA1C" Urine Drug Screen:     Component Value Date/Time   LABOPIA NONE DETECTED 04/20/2023 2203   COCAINSCRNUR NONE DETECTED 04/20/2023 2203   LABBENZ NONE DETECTED 04/20/2023 2203   AMPHETMU NONE DETECTED 04/20/2023 2203   THCU NONE DETECTED 04/20/2023 2203   LABBARB NONE DETECTED 04/20/2023 2203    Alcohol Level     Component Value Date/Time   ETH <10 04/20/2023 1044   INR  Lab Results  Component Value Date   INR 1.1 04/20/2023   APTT  Lab Results  Component Value Date   APTT 30 04/20/2023     ASSESSMENT  68 y.o. male with a PMHx of EtOH abuse, asthma, bullous emphysema, HTN, metastatic adenocarcinoma of the lung on chemotherapy, left lung nodule s/p radiation therapy in 2022, spontaneous pneumothorax (right) in 2019, prostate cancer radiation therapy and tobacco abuse who presented to the ED via EMS on Friday evening with complaints of weakness, SOB, multiple nose bleeds and right ankle swelling.  The patient was at his Oncology appointment earlier on Friday and the provider was concerned about possible CVA vs brain mets causing his symptoms - so they referred patient to the ED. On arrival to the ED, the patient denied N/V, fever, diarrhea or dysuria. At his baseline, the patient walks with a cane. 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 Neurology was consulted for further recommendations.  - Exam is nonfocal with no ataxia. Bilateral mild action tremor and depressed reflexes noted.  - MRI brain: Round, nonenhancing 7 mm focus of mild DWI hyperintensity in the medial left cerebellum, which is new from prior. This finding is indeterminate but may represent a recent or subacute infarct. No associated edema or enhancement to suggest metastasis; however, given the clinical history, recommend short interval follow-up MRI head with contrast in 4-6 weeks to ensure expected evolution. - EKG:  Sinus rhythm; Ventricular premature complex - Impression: Acute-appearing DWI lesion in the left anterolateral cerebellum, most likely representing a small stroke  RECOMMENDATIONS  1. HgbA1c, fasting lipid panel 2. Cardiac telemetry 3. PT consult, OT consult, Speech consult 4. Echocardiogram 5. Continue atorvastatin .  6. Agree with starting ASA  7. Risk factor modification 8. BP management per standard protocol. Most likely out of the permissive HTN time window 9. Frequent neuro checks 10. NPO until passes stroke swallow screen 11. CTA of head and neck    ______________________________________________________________________    Hope Ly, Alexx Mcburney, MD [facility]

## 2023-04-21 NOTE — Assessment & Plan Note (Signed)
 Continue atorvastatin

## 2023-04-21 NOTE — Assessment & Plan Note (Addendum)
 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  MRI was more suggestive of infarct versus metastases though follow-up imaging was recommended. On Tylenol  and tramadol  every 4-6 hours for pain.

## 2023-04-21 NOTE — Progress Notes (Addendum)
 PROGRESS NOTE    Terry Page.  ZOX:[id number] DOB: [birth year] DOA: 04/20/2023 PCP: Terry Gander, MD  Chief Complaint  Patient presents with   Aphasia    Pt arrives from cancer center with complaints of ongoing weakness, sobx1 week. Per CC, pt has slurred speech and favoring L side, no c/o chest pain    Brief Narrative:   Terry Page. is Terry Page 68 y.o. 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  He presented for his usual chemotherapy today and was noted to have weakness for the last week as well as slurred speech.   MRI showed findings concerning for stroke.   Assessment & Plan:   Principal Problem:   CVA (cerebral vascular accident) ([name]) Active Problems:   Primary cancer of left upper lobe of lung ([name])   Essential hypertension   HLD (hyperlipidemia)   Wound eschar of foot   Stroke [facility])  CVA (cerebral vascular accident) ([name]) MRI w/wo showing new round nonenhancing 7 mm focus of mild DWI hyperintensity in the medial L cerebellum.  Indeterminate, but may represent recent or subacute infarct.  Needs short interval follow up MRI with contrast in 4-6 weeks to ensure expected evolution. 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  MRI without edema or enhancement to suggest metastasis, but short interval followup up MRI head with contrast in 4-6 weeks recommended given history. On Tylenol  and tramadol  every 4-6 hours for pain. Takes dex prior to chemo   Leukocytosis Unclear cause, trend  Hypokalemia Replace  and follow  Anemia Trend  Bilateral Heel Wounds Wound/ostomy to evaluate in the morning    DVT prophylaxis: aspirin  Code Status: full Family Communication: none Disposition:   Status is: Observation The patient remains OBS appropriate and will d/c before 2 midnights.   Consultants:  neurology  Procedures:  none  Antimicrobials:  Anti-infectives (From admission, onward)    None       Subjective: No complaints  Objective: Vitals:   04/21/23 0530 04/21/23 0730 04/21/23 0741 04/21/23 0745  BP: (!) 152/86   (!) 149/98  Pulse:  85 85 (!) 113  Resp:  17 17 (!) 22  Temp:  97.7 F (36.5 C) 97.7 F (36.5 C) 97.7 F (36.5 C)  TempSrc:  Oral Oral Oral  SpO2:  100%  100%   No intake or output data in the 24 hours ending 04/21/23 0855 There were no vitals filed for this visit.  Examination:  General exam: Appears calm and comfortable  Respiratory system: unlabored Cardiovascular system: RRR Gastrointestinal system: Abdomen is nondistended, soft and nontender.  Central nervous system: Alert and oriented. Mild dysarthria.  CN 2-12 intact.  5/5 strength.  FNF, heel to shin intact. Sensation intact to light touch. Extremities: bilateral heel wounds, shallow ulcerations    Data Reviewed: I have personally reviewed following labs and imaging studies  CBC: Recent Labs  Lab 04/20/23 0921 04/20/23 1044 04/20/23 1236 04/21/23 0554  WBC 9.4 9.6  --  15.3*  NEUTROABS 8.3* 8.3*  --   --  HGB 10.2* 10.4* 10.5* 10.0*  HCT 31.3* 33.5* 31.0* 31.2*  MCV 84.4 88.6  --  88.6  PLT 300 305  --  316    Basic Metabolic Panel: Recent Labs  Lab 04/20/23 0921 04/20/23 1044 04/20/23 1236 04/21/23 0554  NA 133* 131* 133* 133*  K 3.8 3.5 3.4* 3.1*  CL 101 101 99 99  CO2 24 22  --  24  GLUCOSE 135* 148* 126* 97  BUN 13 14 12 12   CREATININE 0.83 0.86 0.90 0.88  CALCIUM  8.8* 8.7*  --  8.6*    GFR: Estimated Creatinine Clearance: 72.6 mL/min (by C-G formula based on  SCr of 0.88 mg/dL).  Liver Function Tests: Recent Labs  Lab 04/20/23 0921 04/20/23 1044  AST 13* 17  ALT 13 16  ALKPHOS 57 56  BILITOT 0.4 0.7  PROT 6.8 6.7  ALBUMIN 3.4* 2.9*    CBG: Recent Labs  Lab 04/20/23 1037  GLUCAP 121*     Recent Results (from the past 240 hours)  Resp panel by RT-PCR (RSV, Flu Terry Page&B, Covid) Anterior Nasal Swab     Status: None   Collection Time: 04/20/23 11:58 AM   Specimen: Anterior Nasal Swab  Result Value Ref Range Status   SARS Coronavirus 2 by RT PCR NEGATIVE NEGATIVE Final    Comment: (NOTE) SARS-CoV-2 target nucleic acids are NOT DETECTED.  The SARS-CoV-2 RNA is generally detectable in upper respiratory specimens during the acute phase of infection. The lowest concentration of SARS-CoV-2 viral copies this assay can detect is 138 copies/mL. Terry Page negative result does not preclude SARS-Cov-2 infection and should not be used as the sole basis for treatment or other patient management decisions. Terry Page negative result may occur with  improper specimen collection/handling, submission of specimen other than nasopharyngeal swab, presence of viral mutation(s) within the areas targeted by this assay, and inadequate number of viral copies(<138 copies/mL). Terry Page negative result must be combined with clinical observations, patient history, and epidemiological information. The expected result is Negative.  Fact Sheet for Patients:  BloggerCourse.com  Fact Sheet for Healthcare Providers:  SeriousBroker.it  This test is no t yet approved or cleared by the [place]  FDA and  has been authorized for detection and/or diagnosis of SARS-CoV-2 by FDA under an Emergency Use Authorization (EUA). This EUA will remain  in effect (meaning this test can be used) for the duration of the COVID-19 declaration under Section 564(b)(1) of the Act, 21 U.S.C.section 360bbb-3(b)(1), unless the authorization is terminated  or  revoked sooner.       Influenza Terry Page by PCR NEGATIVE NEGATIVE Final   Influenza B by PCR NEGATIVE NEGATIVE Final    Comment: (NOTE) The Xpert Xpress SARS-CoV-2/FLU/RSV plus assay is intended as an aid in the diagnosis of influenza from Nasopharyngeal swab specimens and should not be used as Thayer Inabinet sole basis for treatment. Nasal washings and aspirates are unacceptable for Xpert Xpress SARS-CoV-2/FLU/RSV testing.  Fact Sheet for Patients: BloggerCourse.com  Fact Sheet for Healthcare Providers: SeriousBroker.it  This test is not yet approved or cleared by the [place]  FDA and has been authorized for detection and/or diagnosis of SARS-CoV-2 by FDA under an Emergency Use Authorization (EUA). This EUA will remain in effect (meaning this test can be used) for the duration of the COVID-19 declaration under Section 564(b)(1) of the Act, 21 U.S.C. section 360bbb-3(b)(1), unless the authorization is terminated or revoked.     Resp Syncytial Virus by PCR NEGATIVE NEGATIVE Final    Comment: (NOTE)  Fact Sheet for Patients: BloggerCourse.com  Fact Sheet for Healthcare Providers: SeriousBroker.it  This test is not yet approved or cleared by the [place]  FDA and has been authorized for detection and/or diagnosis of SARS-CoV-2 by FDA under an Emergency Use Authorization (EUA). This EUA will remain in effect (meaning this test can be used) for the duration of the COVID-19 declaration under Section 564(b)(1) of the Act, 21 U.S.C. section 360bbb-3(b)(1), unless the authorization is terminated or revoked.  Performed at [facility], [address], [place], [place] [address]          Radiology Studies: MR Brain W and Wo Contrast Result Date: 04/20/2023 CLINICAL DATA:  Mental status change, unknown cause EXAM: MRI HEAD WITHOUT AND WITH CONTRAST TECHNIQUE:  Multiplanar, multiecho pulse sequences of the brain and surrounding structures were obtained without and with intravenous contrast. CONTRAST:  6mL GADAVIST  GADOBUTROL  1 MMOL/ML IV SOLN COMPARISON:  Same day CT head.  MRI head May 4, 23. FINDINGS: Brain: Round, nonenhancing 7 mm focus of mild DWI hyperintensity in the medial left cerebellum (series 5, image 6 and series 8, image 4), which is new from prior. No substantial mass effect. No pathologic enhancement. No definite acute hemorrhage or hydrocephalus. Vascular: Major arterial flow voids are maintained skull base. Skull and upper cervical spine: Normal marrow signal. Sinuses/Orbits: Clear sinuses.  No acute orbital findings. IMPRESSION: Round, nonenhancing 7 mm focus of mild DWI hyperintensity in the medial left cerebellum, which is new from prior. This finding is indeterminate but may represent Gayathri Futrell recent or subacute infarct. No associated edema or enhancement to suggest metastasis; however, given the clinical history, recommend short interval follow-up MRI head with contrast in 4-6 weeks to ensure expected evolution. Electronically Signed   By: Stevenson Elbe M.D.   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 History of COPD. Generalized weakness and slurred speech. Recent change to voice. EXAM: CT ANGIOGRAPHY CHEST WITH CONTRAST TECHNIQUE: Multidetector CT imaging of the chest was performed using the standard protocol during bolus administration of intravenous contrast. Multiplanar CT image reconstructions and MIPs were obtained to evaluate the vascular anatomy. RADIATION DOSE REDUCTION: This exam was performed according to the departmental dose-optimization program which includes automated exposure control, adjustment of the mA and/or kV according to patient size and/or use of iterative reconstruction technique. CONTRAST:  75mL OMNIPAQUE   IOHEXOL  350 MG/ML SOLN COMPARISON:  Same day chest radiograph and CT 02/15/2023 FINDINGS: Cardiovascular: No pericardial effusion. Negative for acute pulmonary embolism. Aortic and coronary artery atherosclerotic calcification. Right chest wall [place] Loomer-Cath. Mediastinum/Nodes: Trachea and esophagus are unremarkable. Similar soft tissue thickening about the inferior left hilum compared to prior and likely representing post treatment change. Lungs/Pleura: Advanced paraseptal and centrilobular emphysema with bullous change in the left apex. Diffuse bronchial wall thickening. No pleural effusion or pneumothorax. Increased size of the irregular consolidation in the left apex now measuring 2.6 x 2.0 cm. This measured 2.4 x 1.3 cm on CT 02/15/2023 using similar measuring technique. This may represent progression of post treatment change however residual or recurrent malignancy is not excluded. Multiple pulmonary nodules are redemonstrated and are similar in size to 02/15/2023. Interval increase in size of Alyia Lacerte subpleural anterior right upper lobe nodule measuring 6 mm nodule (13/108) previously measured 4 mm. Multiple new nodules in the left lower lobe measuring up to 6 mm (series 13/image 288). Upper Abdomen:  Stable hypoattenuating lesions in the liver likely representing benign cyst. No acute abnormality in the visualized abdomen. Musculoskeletal: No acute fracture. Similar mixed lytic and sclerotic appearance of the left scapula. Review of the MIP images confirms the above findings. IMPRESSION: 1. Negative for acute pulmonary embolism. 2. Increased size of the irregular consolidation in the left apex now measuring 2.6 x 2.0 cm. This measured 2.4 x 1.3 cm on CT 02/15/2023. This may represent progression of post treatment change however residual or recurrent malignancy is not excluded. Recommend close interval follow-up or PET/CT. 3. Multiple new nodules in the left lower lobe measuring up to 6 mm. Interval increase in size  of Jennie Hannay subpleural anterior right upper lobe nodule measuring 6 mm previously measured 4 mm. Aortic Atherosclerosis (ICD10-I70.0) and Emphysema (ICD10-J43.9). Electronically Signed   By: Rozell Cornet M.D.   On: 04/20/2023 19:41   CT Soft Tissue Neck W Contrast Result Date: 04/20/2023 CLINICAL DATA:  Soft tissue infection suspected, generalized weakness and speech difficulty. History of metastatic prostate/lung cancer. EXAM: CT NECK WITH CONTRAST TECHNIQUE: Multidetector CT imaging of the neck was performed using the standard protocol following the bolus administration of intravenous contrast. RADIATION DOSE REDUCTION: This exam was performed according to the departmental dose-optimization program which includes automated exposure control, adjustment of the mA and/or kV according to patient size and/or use of iterative reconstruction technique. CONTRAST:  75mL OMNIPAQUE  IOHEXOL  350 MG/ML SOLN COMPARISON:  None Available. FINDINGS: Pharynx and larynx: Symmetric appearance of the nasopharynx. The palatine tonsils are relatively symmetric. Normal appearance of the oral cavity and floor of mouth. The base of tongue is unremarkable. The epiglottis is normal. No retropharyngeal effusion. There is no evidence of fluid collection or peritonsillar abscess. Symmetric appearance of the aryepiglottic folds and piriform sinuses. Normal appearance of the paraglottic fat. The vocal folds are symmetric. Salivary glands: There is slightly asymmetric hyperattenuation of the right parotid gland which may be artifactual. The parotid glands are relatively symmetric in size. There is no inflammatory stranding adjacent to the right parotid gland. No ductal enlargement or evidence of abnormal calcification. The submandibular glands are symmetric. Thyroid : Normal. Lymph nodes: There are no enlarged lymph nodes in the neck. There is Jacobe Study 1.5 x 0.9 x 0.8 cm cystic appearing focus within the right posterior neck involving cervical level 2 B/5  which demonstrates internal fluid density. No definite associated soft tissue component. Vascular: Moderate atherosclerosis of the visualized aortic arch. There is prominent atherosclerosis involving the origin of the left subclavian artery resulting in at least moderate stenosis. Additional prominent atherosclerosis of distal left subclavian/proximal left axillary artery. Prominent calcified atherosclerosis involving the bilateral carotid bifurcations. There is likely severe stenosis at the origins of both internal carotid arteries. Additional atherosclerosis of the partially visualized carotid siphons. Right IJ approach central venous catheter noted. Limited intracranial: Negative. Visualized orbits: Negative. Mastoids and visualized paranasal sinuses: Mastoid air cells are clear. Mucosal thickening in the alveolar recesses of the maxillary sinuses. Skeleton: No acute or aggressive finding noted. Degenerative changes throughout the cervical spine. Upper chest: Emphysema. Scarring in the bilateral lung apices and in the left upper lobe. See separately dictated CTA chest for complete evaluation of intrathoracic findings. Other: None. IMPRESSION: No acute abnormality along the aero digestive structures in the neck. No evidence of abscess. Slightly asymmetric attenuation of the right parotid gland which may be artifactual. No adjacent inflammatory stranding. Recommend correlation with tenderness over the right parotid. 1.5 cm cystic focus in the right posterior neck may  reflect Collis Thede lymphangioma/lymphatic malformation. There is no soft tissue component to suggest necrotic lymph node. Recommend short interval follow-up with CT or ultrasound of the neck. Atherosclerosis as above. 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 Consider CTA for further evaluation if clinically indicated. Electronically Signed   By: Denny Flack M.D.   On: 04/20/2023 18:26   DG Chest 2 View Result Date: 04/20/2023 CLINICAL  DATA:  Cough and shortness of breath EXAM: CHEST - 2 VIEW COMPARISON:  X-ray 04/08/2023 and older FINDINGS: Stable right IJ chest port with tip along the central SVC. Hyperinflation with chronic lung changes. No pneumothorax, effusion or edema. Normal cardiopericardial silhouette. Calcified aorta. Overlapping cardiac leads. Apical pleural thickening. Degenerative changes along the spine. IMPRESSION: Hyperinflation with chronic lung changes. Calcified aorta. Right IJ chest port. Electronically Signed   By: Adrianna Horde M.D.   On: 04/20/2023 12:43   CT HEAD WO CONTRAST Result Date: 04/20/2023 CLINICAL DATA:  Provided history: Mental status change, unknown cause. Additional history provided: Stroke-like symptoms. History of lung adenocarcinoma and prostate cancer. EXAM: CT HEAD WITHOUT CONTRAST TECHNIQUE: Contiguous axial images were obtained from the base of the skull through the vertex without intravenous contrast. RADIATION DOSE REDUCTION: This exam was performed according to the departmental dose-optimization program which includes automated exposure control, adjustment of the mA and/or kV according to patient size and/or use of iterative reconstruction technique. COMPARISON:  Brain MRI 05/05/2021. FINDINGS: Brain: Generalized cerebral atrophy. 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 Patchy and ill-defined hypoattenuation within the cerebral white matter, nonspecific but compatible with mild chronic small vessel ischemic disease. There is no acute intracranial hemorrhage. No demarcated cortical infarct. No extra-axial fluid collection. No evidence of an intracranial mass. No midline shift. Vascular: No hyperdense vessel.  Atherosclerotic calcifications. Skull: No calvarial fracture. 9 x 3 mm osseous protrusion projecting outward from the right frontal calvarium most consistent with an osteoma. 12 mm lucent focus within the left parietal calvarium, extending to the  inner table (series 4, image 32) (series 6, image 45). Sinuses/Orbits: No mass or acute finding within the imaged orbits. 6 mm left frontoethmoidal osteoma. Trace mucosal thickening scattered within the paranasal sinuses at the imaged levels. IMPRESSION: 1. Please note, assessment for intracranial metastatic disease is limited on this non-contrast head CT. 2. No evidence of an acute intracranial abnormality. 3. 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 4. Mild cerebral white matter chronic small vessel ischemic disease. 5. Generalized cerebral atrophy. 6. Nonspecific 12 mm lucent focus within the left parietal calvarium. An osseous metastasis cannot be excluded. Consider Persephanie Laatsch brain MRI (with and without contrast) for further evaluation. Electronically Signed   By: Bascom Lily D.O.   On: 04/20/2023 11:57        Scheduled Meds:   stroke: early stages of recovery book   Does not apply Once   aspirin   300 mg Rectal Daily   Or   aspirin   325 mg Oral Daily   atorvastatin   20 mg Oral Daily   dexamethasone   4 mg Oral Daily   dronabinol   5 mg Oral BID AC   enoxaparin  (LOVENOX ) injection  40 mg Subcutaneous Q24H   folic acid   1 mg Oral Daily   irbesartan   300 mg Oral Daily   potassium chloride   40 mEq Oral Q4H   senna-docusate  2 tablet Oral BID   sodium chloride  flush  3 mL Intravenous Q12H   Continuous  Infusions:  sodium chloride        LOS: 0 days    Time spent: over 30 min    Donnetta Gains, MD [facility]   To contact the attending provider between 7A-7P or the covering provider during after hours 7P-7A, please log into the web site [email or web address] and access using universal [place] password for that web site. If you do not have the password, please call the hospital operator.  04/21/2023, 8:55 AM

## 2023-04-21 NOTE — Evaluation (Addendum)
 Physical Therapy Evaluation Patient Details Name: Terry Page. MRN: [id number] DOB: [birth year] Today's Date: 04/21/2023  History of Present Illness  Terry Page. is a 68 y.o. male who  presented  04/20/23 for his usual chemotherapy  and was noted to have weakness and  slurred speech, favoring left side. 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  Clinical Impression  Pt admitted with above diagnosis.  Pt currently with functional limitations due to the deficits listed below (see PT Problem List). Pt will benefit from acute skilled PT to increase their independence and safety with mobility to allow discharge.       The patient reports pain in the heel wounds but can ambulate.  Patient ambulated x 100' using SPC. Patient with  unsteady gait and 1 LOB. Patient  did report that he ambulated without the cane earlier.  Patient resides alone, has  family support.  Patient may benefit from OPPT and supervision until gait is assessed as safe independently.  Patient with noted facial droop on the left,and slurred speech. Patient looked in the mirror and did not  notice the droop.       If plan is discharge home, recommend the following: A little help with walking and/or transfers;Assistance with cooking/housework;Assist for transportation;A little help with bathing/dressing/bathroom;Help with stairs or ramp for entrance   Can travel by private vehicle        Equipment Recommendations  (TBD)  Recommendations for Other Services       Functional Status Assessment Patient has had a recent decline in their functional status and demonstrates the ability to make significant improvements in function in a reasonable and predictable amount of time.     Precautions / Restrictions Precautions Precautions: Fall Precaution/Restrictions Comments: has wounds on heels(peeled off  scab) Restrictions Weight Bearing Restrictions Per Provider Order: No      Mobility  Bed Mobility Overal bed mobility: Modified Independent                  Transfers Overall transfer level: Needs assistance Equipment used: None               General transfer comment: close guard, tends to hold onto object when standing    Ambulation/Gait Ambulation/Gait assistance: Contact guard assist Gait Distance (Feet): 100 Feet Assistive device: Straight cane Gait Pattern/deviations: Step-to pattern, Staggering left, Drifts right/left Gait velocity: decr Gait velocity interpretation: <1.31 ft/sec, indicative of household ambulator   General Gait Details: gait unsteady at times, 1 balance loss, stopped ambulating then  proceeded  Stairs            Wheelchair Mobility     Tilt Bed    Modified Rankin (Stroke Patients Only)       Balance Overall balance assessment: Needs assistance Sitting-balance support: Feet supported, No upper extremity supported Sitting balance-Leahy Scale: Good     Standing balance support: During functional activity, No upper extremity supported Standing balance-Leahy Scale: Poor                               Pertinent Vitals/Pain Pain Assessment Pain Assessment: Faces Pain Score: 7  Pain Location: both heels    Home Living Family/patient expects to be discharged to:: Private residence Living Arrangements: Spouse/significant other;Children Available Help at Discharge: Family;Available PRN/intermittently Type of Home: Apartment  Home Access: Level entry       Home Layout: One level Home Equipment: Cane - single point Additional Comments: wife does nt live with pt. but available and son    Prior Function Prior Level of Function : Independent/Modified Independent             Mobility Comments: does not drive ADLs Comments: independent     Extremity/Trunk Assessment   Upper Extremity Assessment Upper  Extremity Assessment: Defer to OT evaluation    Lower Extremity Assessment Lower Extremity Assessment: Overall WFL for tasks assessed    Cervical / Trunk Assessment Cervical / Trunk Assessment: Normal  Communication   Communication Communication: No apparent difficulties Factors Affecting Communication: Other (comment) (noted slurred speech)    Cognition Arousal: Alert Behavior During Therapy: WFL for tasks assessed/performed   PT - Cognitive impairments: No apparent impairments                         Following commands: Intact       Cueing       General Comments      Exercises     Assessment/Plan    PT Assessment Patient needs continued PT services  PT Problem List Decreased strength;Decreased mobility;Decreased safety awareness;Decreased activity tolerance;Pain;Decreased balance       PT Treatment Interventions DME instruction;Gait training;Functional mobility training;Therapeutic activities;Patient/family education;Neuromuscular re-education;Therapeutic exercise    PT Goals (Current goals can be found in the Care Plan section)  Acute Rehab PT Goals Patient Stated Goal: go home PT Goal Formulation: With patient Time For Goal Achievement: 05/05/23 Potential to Achieve Goals: Good    Frequency Min 3X/week     Co-evaluation               AM-PAC PT "6 Clicks" Mobility  Outcome Measure Help needed turning from your back to your side while in a flat bed without using bedrails?: None Help needed moving from lying on your back to sitting on the side of a flat bed without using bedrails?: A Little Help needed moving to and from a bed to a chair (including a wheelchair)?: A Little Help needed standing up from a chair using your arms (e.g., wheelchair or bedside chair)?: A Little Help needed to walk in hospital room?: A Little Help needed climbing 3-5 steps with a railing? : A Lot 6 Click Score: 18    End of Session Equipment Utilized During  Treatment: Gait belt Activity Tolerance: Patient tolerated treatment well Patient left: in bed;with call bell/phone within reach Nurse Communication: Mobility status PT Visit Diagnosis: Unsteadiness on feet (R26.81)    Time: 0981-1914 PT Time Calculation (min) (ACUTE ONLY): 26 min   Charges:   PT Evaluation $PT Eval Low Complexity: 1 Low PT Treatments $Gait Training: 8-22 mins PT General Charges $$ ACUTE PT VISIT: 1 Visit         Abelina Hoes PT Acute Rehabilitation Services Office [phone] Weekend [phone]    Dareen Ebbing 04/21/2023, 12:33 PM

## 2023-04-21 NOTE — Hospital Course (Signed)
 With his wife.  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  He presented for his usual chemotherapy today and was noted to have weakness for the last week as well as slurred speech.  He was sent to the ED for workup of stroke versus brain mets.  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  Neurology was consulted and asked for the patient to be admitted to [facility].

## 2023-04-21 NOTE — Assessment & Plan Note (Signed)
 Usual stroke workup Lipid panel Carotid artery Dopplers Telemetry Aspirin  Echo

## 2023-04-21 NOTE — Assessment & Plan Note (Signed)
 Continue amlodipine , ARB, beta-blocker

## 2023-04-21 NOTE — Consult Note (Signed)
 WOC Nurse Consult Note: Reason for Consult: heel wound Patient from oncology office for slowed speech, history of lung ca, prostate ca Wound type: pressure injuries; Stage 3 bilateral heels Pressure Injury POA: Yes Measurement: see nursing flow sheet Wound bed:100% pink bilaterally  Drainage (amount, consistency, odor) see nursing flow sheets Periwound:intact  Dressing procedure/placement/frequency: Cleanse bilateral heel wounds with saline, cover with single layer of xeroform, top with foam. Change every other day.  Prevalon boots bilaterally, offload at all times.    Re consult if needed, will not follow at this time. Thanks  Suhayla Chisom [employer], RN,CWOCN, CNS, CWON-AP [phone])

## 2023-04-21 NOTE — Progress Notes (Signed)
 MRI brain: Round, nonenhancing 7 mm focus of mild DWI hyperintensity in the medial left cerebellum, stroke vs. Met, undergoing stroke workup  No results found for: "HGBA1C"   Lab Results  Component Value Date   CHOL 144 04/21/2023   HDL 40 (L) 04/21/2023   LDLCALC 78 04/21/2023   TRIG 132 04/21/2023   CHOLHDL 3.6 04/21/2023   CTA head and neck  1. No acute head CT finding. Previously described peripheral left cerebellar lesion is not visible on this exam due to technical deficiency. 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 2. Lytic lesion of the left parietal bone unchanged from recent studies but new since the MRI of 05-23-21, therefore likely a calvarial metastasis. 3. Aortic atherosclerosis. 4. 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 Vessel is occluded 2 cm beyond that in the upper mediastinum. No flow within the left common carotid artery beyond that. Dense calcified plaque at the left carotid bifurcation and ICA bulb. Reconstituted flow at this level, probably from external to internal collateral vessels. Beyond the atherosclerotic disease at the bifurcation, the ICA is patent to the skull base as a small vessel, diameter 2.8 mm. 5. Advanced atherosclerotic disease at the right carotid bifurcation and ICA bulb. Multiple serial stenoses. In the distal bulb, minimal diameter is 1 mm or less. Compared to a more distal cervical ICA diameter of 5 mm, this indicates an 80% or greater stenosis. 6. Extensive atherosclerotic disease of the proximal right vertebral artery with multiple serial stenoses. Greatest stenosis in the mid cervical region is probably a 70-80%. The vessel does show flow to the skull base and foramen magnum level. 7. 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 8. Advanced atherosclerotic disease of both carotid siphons with multiple stenoses on both sides. Serial stenoses  are estimated in the range of 50-70%. 9. No acute intracranial large or medium vessel occlusion. The left cerebellar lesion remains indeterminate at this time for a late subacute stroke versus an atypical metastasis. Additionally on the MRI, there is an up to 12 mm newly seen lesion in the region of the left cerebellar flocculus that was not there in May of 2023. Although this is nonenhancing, it is viewed with some suspicion. It could be a newly developed cyst, though I do not know why that would happen. Therefore, this should also be observed for the possibility of a metastasis.  ECHO:   1. Left ventricular ejection fraction, by estimation, is 60 to 65%. The  left ventricle has normal function. The left ventricle has no regional  wall motion abnormalities. Indeterminate diastolic filling due to E-A  fusion.   2. Right ventricular systolic function is normal. The right ventricular  size is normal. Tricuspid regurgitation signal is inadequate for assessing  PA pressure.   3. The mitral valve is grossly normal. No evidence of mitral valve  regurgitation. No evidence of mitral stenosis.   4. The aortic valve is tricuspid. Aortic valve regurgitation is not  visualized. No aortic stenosis is present.   5. Aortic dilatation noted. There is mild dilatation of the aortic root,  measuring 41 mm. There is mild dilatation of the ascending aorta,  measuring 41 mm.   6. The inferior vena cava is normal in size with greater than 50%  respiratory variability, suggesting right atrial pressure of 3 mmHg.  [Normal left atrial size, poor visualization of the right atrium]  LE Duplex: Negative for  DVT  Current Outpatient Medications  Medication Instructions   acetaminophen  (TYLENOL ) 650 mg, Oral, Every 6 hours PRN   amLODipine  (NORVASC ) 10 MG tablet 1 tablet, Daily   atorvastatin  (LIPITOR) 20 mg, Oral, Daily   cyanocobalamin  (VITAMIN B12) 1,000 mcg, Oral, Daily   dexamethasone  (DECADRON ) 4 MG  tablet Take 2 tabs by mouth 2 times daily starting day before chemo. Then take 2 tabs daily for 2 days starting day after chemo. Take with food.   dronabinol  (MARINOL ) 5 mg, Oral, 2 times daily before lunch and supper   ferrous sulfate  325 mg, Oral, Daily with breakfast   folic acid  (FOLVITE ) 1 mg, Oral, Daily   hydrOXYzine  (ATARAX ) 25 mg, Oral, At bedtime PRN   ipratropium-albuterol  (DUONEB) 0.5-2.5 (3) MG/3ML SOLN 3 mLs, Nebulization, Every 6 hours PRN   lidocaine  (LIDODERM ) 5 % 1 patch, Transdermal, Every 24 hours, Remove & Discard patch within 12 hours or as directed by MD. Apply to low back   lidocaine -prilocaine  (EMLA ) cream 1 Application, Topical, As needed   olmesartan (BENICAR) 40 mg, Oral, Daily   ondansetron  (ZOFRAN ) 8 mg, Oral, Every 8 hours PRN   polyethylene glycol (MIRALAX ) 17 g, Oral, Daily   potassium chloride  SA (KLOR-CON  M) 20 MEQ tablet TAKE 1 TABLET(20 MEQ) BY MOUTH DAILY   prochlorperazine  (COMPAZINE ) 10 mg, Oral, Every 6 hours PRN   SENNA S 8.6-50 MG tablet 2 tablets, Oral, At bedtime PRN   senna-docusate (STIMULANT LAXATIVE) 8.6-50 MG tablet 2 tablets, Oral, 2 times daily, May add additional tablet as needed   sodium chloride  1 g, Oral, 3 times daily with meals   thiamine  (VITAMIN B1) 100 mg, Oral, Daily   traMADol  (ULTRAM ) 50-100 mg, Oral, Every 6 hours PRN     Recommendations - HgbA1c not yet resulted, goal < 7% - LDL slightly above goal, increase atorvastatin  to 40 mg nightly if patient is tolerating this well  - Due to lack of clarity on stroke vs. Metastatic disease, aspirin  325 mg daily for now (hold off on Plavix) - Repeat MRI brain w/ and w/o in 4-6 weeks to ensure expected evolution - Inpatient neurology will sign off at this time, please reach out if there are any new questions/concerns  Baldwin Levee MD-PhD [facility] [phone] Available 7 AM to 7 PM, outside these hours please contact Neurologist on call listed on AMION   No charge  note

## 2023-04-21 NOTE — ED Notes (Signed)
 [place] at bedside

## 2023-04-21 NOTE — Assessment & Plan Note (Signed)
 Wound/ostomy to evaluate in the morning

## 2023-04-22 ENCOUNTER — Other Ambulatory Visit: Payer: Self-pay

## 2023-04-22 DIAGNOSIS — J439 Emphysema, unspecified: Secondary | ICD-10-CM | POA: Diagnosis present

## 2023-04-22 DIAGNOSIS — J4489 Other specified chronic obstructive pulmonary disease: Secondary | ICD-10-CM | POA: Diagnosis present

## 2023-04-22 DIAGNOSIS — E785 Hyperlipidemia, unspecified: Secondary | ICD-10-CM | POA: Diagnosis present

## 2023-04-22 DIAGNOSIS — Z1152 Encounter for screening for COVID-19: Secondary | ICD-10-CM | POA: Diagnosis not present

## 2023-04-22 DIAGNOSIS — E876 Hypokalemia: Secondary | ICD-10-CM | POA: Diagnosis present

## 2023-04-22 DIAGNOSIS — C7951 Secondary malignant neoplasm of bone: Secondary | ICD-10-CM | POA: Diagnosis present

## 2023-04-22 DIAGNOSIS — C3412 Malignant neoplasm of upper lobe, left bronchus or lung: Secondary | ICD-10-CM | POA: Diagnosis present

## 2023-04-22 DIAGNOSIS — L89613 Pressure ulcer of right heel, stage 3: Secondary | ICD-10-CM | POA: Diagnosis present

## 2023-04-22 DIAGNOSIS — Z79899 Other long term (current) drug therapy: Secondary | ICD-10-CM | POA: Diagnosis not present

## 2023-04-22 DIAGNOSIS — R4701 Aphasia: Secondary | ICD-10-CM | POA: Diagnosis present

## 2023-04-22 DIAGNOSIS — I639 Cerebral infarction, unspecified: Secondary | ICD-10-CM | POA: Diagnosis present

## 2023-04-22 DIAGNOSIS — D630 Anemia in neoplastic disease: Secondary | ICD-10-CM | POA: Diagnosis present

## 2023-04-22 DIAGNOSIS — L8990 Pressure ulcer of unspecified site, unspecified stage: Secondary | ICD-10-CM | POA: Insufficient documentation

## 2023-04-22 DIAGNOSIS — R4182 Altered mental status, unspecified: Secondary | ICD-10-CM | POA: Diagnosis present

## 2023-04-22 DIAGNOSIS — Z87891 Personal history of nicotine dependence: Secondary | ICD-10-CM | POA: Diagnosis not present

## 2023-04-22 DIAGNOSIS — Z923 Personal history of irradiation: Secondary | ICD-10-CM | POA: Diagnosis not present

## 2023-04-22 DIAGNOSIS — C7931 Secondary malignant neoplasm of brain: Secondary | ICD-10-CM | POA: Diagnosis present

## 2023-04-22 DIAGNOSIS — R471 Dysarthria and anarthria: Secondary | ICD-10-CM | POA: Diagnosis present

## 2023-04-22 DIAGNOSIS — L89623 Pressure ulcer of left heel, stage 3: Secondary | ICD-10-CM | POA: Diagnosis present

## 2023-04-22 DIAGNOSIS — Z885 Allergy status to narcotic agent status: Secondary | ICD-10-CM | POA: Diagnosis not present

## 2023-04-22 DIAGNOSIS — I63542 Cerebral infarction due to unspecified occlusion or stenosis of left cerebellar artery: Secondary | ICD-10-CM | POA: Diagnosis present

## 2023-04-22 DIAGNOSIS — I10 Essential (primary) hypertension: Secondary | ICD-10-CM | POA: Diagnosis present

## 2023-04-22 DIAGNOSIS — C61 Malignant neoplasm of prostate: Secondary | ICD-10-CM | POA: Diagnosis present

## 2023-04-22 LAB — PHOSPHORUS: Phosphorus: 1.9 mg/dL — ABNORMAL LOW (ref 2.5–4.6)

## 2023-04-22 LAB — CBC WITH DIFFERENTIAL/PLATELET
Abs Immature Granulocytes: 0.17 10*3/uL — ABNORMAL HIGH (ref 0.00–0.07)
Basophils Absolute: 0 10*3/uL (ref 0.0–0.1)
Basophils Relative: 0 %
Eosinophils Absolute: 0 10*3/uL (ref 0.0–0.5)
Eosinophils Relative: 0 %
HCT: 29.7 % — ABNORMAL LOW (ref 39.0–52.0)
Hemoglobin: 9.1 g/dL — ABNORMAL LOW (ref 13.0–17.0)
Immature Granulocytes: 1 %
Lymphocytes Relative: 11 %
Lymphs Abs: 1.7 10*3/uL (ref 0.7–4.0)
MCH: 27.9 pg (ref 26.0–34.0)
MCHC: 30.6 g/dL (ref 30.0–36.0)
MCV: 91.1 fL (ref 80.0–100.0)
Monocytes Absolute: 2.3 10*3/uL — ABNORMAL HIGH (ref 0.1–1.0)
Monocytes Relative: 15 %
Neutro Abs: 11.7 10*3/uL — ABNORMAL HIGH (ref 1.7–7.7)
Neutrophils Relative %: 73 %
Platelets: 295 10*3/uL (ref 150–400)
RBC: 3.26 MIL/uL — ABNORMAL LOW (ref 4.22–5.81)
RDW: 22.3 % — ABNORMAL HIGH (ref 11.5–15.5)
WBC: 16 10*3/uL — ABNORMAL HIGH (ref 4.0–10.5)
nRBC: 0 % (ref 0.0–0.2)

## 2023-04-22 LAB — COMPREHENSIVE METABOLIC PANEL WITH GFR
ALT: 13 U/L (ref 0–44)
AST: 15 U/L (ref 15–41)
Albumin: 2.5 g/dL — ABNORMAL LOW (ref 3.5–5.0)
Alkaline Phosphatase: 46 U/L (ref 38–126)
Anion gap: 5 (ref 5–15)
BUN: 14 mg/dL (ref 8–23)
CO2: 25 mmol/L (ref 22–32)
Calcium: 8.6 mg/dL — ABNORMAL LOW (ref 8.9–10.3)
Chloride: 104 mmol/L (ref 98–111)
Creatinine, Ser: 0.86 mg/dL (ref 0.61–1.24)
GFR, Estimated: >60 mL/min (ref >60)
Glucose, Bld: 101 mg/dL — ABNORMAL HIGH (ref 70–99)
Potassium: 4 mmol/L (ref 3.5–5.1)
Sodium: 134 mmol/L — ABNORMAL LOW (ref 135–145)
Total Bilirubin: 0.2 mg/dL (ref 0.0–1.2)
Total Protein: 5.4 g/dL — ABNORMAL LOW (ref 6.5–8.1)

## 2023-04-22 LAB — MAGNESIUM: Magnesium: 1.6 mg/dL — ABNORMAL LOW (ref 1.7–2.4)

## 2023-04-22 LAB — VITAMIN B12: Vitamin B-12: 575 pg/mL (ref 180–914)

## 2023-04-22 LAB — FOLATE: Folate: 15.2 ng/mL (ref >5.9)

## 2023-04-22 LAB — IRON AND TIBC
Iron: 94 ug/dL (ref 45–182)
Saturation Ratios: 38 % (ref 17.9–39.5)
TIBC: 249 ug/dL — ABNORMAL LOW (ref 250–450)
UIBC: 155 ug/dL

## 2023-04-22 LAB — FERRITIN: Ferritin: 914 ng/mL — ABNORMAL HIGH (ref 24–336)

## 2023-04-22 MED ORDER — LACTATED RINGERS IV BOLUS
1000.0000 mL | Freq: Once | INTRAVENOUS | Status: AC
  Administered 2023-04-22: 1000 mL via INTRAVENOUS

## 2023-04-22 MED ORDER — MAGNESIUM SULFATE 4 GM/100ML IV SOLN
4.0000 g | Freq: Once | INTRAVENOUS | Status: AC
Start: 2023-04-22 — End: 2023-04-22
  Administered 2023-04-22: 4 g via INTRAVENOUS
  Filled 2023-04-22: qty 100

## 2023-04-22 MED ORDER — K PHOS MONO-SOD PHOS DI & MONO 155-852-130 MG PO TABS
500.0000 mg | ORAL_TABLET | Freq: Four times a day (QID) | ORAL | Status: AC
  Administered 2023-04-22 (×4): 500 mg via ORAL
  Filled 2023-04-22 (×4): qty 2

## 2023-04-22 NOTE — Progress Notes (Signed)
   04/22/23 0748  Assess: MEWS Score  Temp 97.9 F (36.6 C)  BP 129/87  MAP (mmHg) 101  Pulse Rate (!) 115  Resp 16  Level of Consciousness Alert  SpO2 99 %  O2 Device Room Air  Patient Activity (if Appropriate) In bed  Assess: MEWS Score  MEWS Temp 0  MEWS Systolic 0  MEWS Pulse 2  MEWS RR 0  MEWS LOC 0  MEWS Score 2  MEWS Score Color Yellow  Assess: if the MEWS score is Yellow or Red  Were vital signs accurate and taken at a resting state? Yes  Does the patient meet 2 or more of the SIRS criteria? Yes  Does the patient have a confirmed or suspected source of infection? No  MEWS guidelines implemented  No, previously yellow, continue vital signs every 4 hours  Notify: Charge Nurse/RN  Name of Charge Nurse/RN Notified Tiney Fore, RN  Assess: SIRS CRITERIA  SIRS Temperature  0  SIRS Respirations  0  SIRS Pulse 1  SIRS WBC 1  SIRS Score Sum  2

## 2023-04-22 NOTE — Care Management Obs Status (Signed)
 MEDICARE OBSERVATION STATUS NOTIFICATION   Patient Details  Name: Terry Page. MRN: [id number] Date of Birth: December 25, 1955   Medicare Observation Status Notification Given:  Yes    Levie Ream, RN 04/22/2023, 10:07 AM

## 2023-04-22 NOTE — TOC Initial Note (Addendum)
 Transition of Care (TOC) - Initial/Assessment Note    Patient Details  Name: Terry Page. MRN: [id number] Date of Birth: [birth year]  Transition of Care [facility]) CM/SW Contact:    Levie Ream, RN Phone Number: 04/22/2023, 10:10 AM  Clinical Narrative:                 Spoke w/ pt in room; pt says he lives at home; he plans to return at d/c; he identified POC Tavious Griesinger (sp) [phone]; pt says his son will provide transportation; pt verified insurance/PCP; he denied SDOH risks; pt says he has cane, walker, and BSC; pt says he has grab bars in shower; he does not have HH services or home oxygen; noted OPPT recc; pt will arrange w/ his PCP; TOC is following.  Expected Discharge Plan: Home/Self Care Barriers to Discharge: Continued Medical Work up   Patient Goals and CMS Choice Patient states their goals for this hospitalization and ongoing recovery are:: home CMS Medicare.gov Compare Post Acute Care list provided to:: Patient   [place] ownership interest in [facility]provided to:: Patient    Expected Discharge Plan and Services   Discharge Planning Services: CM Consult                                          Prior Living Arrangements/Services   Lives with:: Spouse Patient language and need for interpreter reviewed:: Yes Do you feel safe going back to the place where you live?: Yes      Need for Family Participation in Patient Care: Yes (Comment) Care giver support system in place?: Yes (comment) Current home services: DME (cane, walker, BSC) Criminal Activity/Legal Involvement Pertinent to Current Situation/Hospitalization: No - Comment as needed  Activities of Daily Living   ADL Screening (condition at time of admission) Independently performs ADLs?: Yes (appropriate for developmental age) Is the patient deaf or have difficulty hearing?: No Does the patient have difficulty seeing, even when wearing glasses/contacts?:  No Does the patient have difficulty concentrating, remembering, or making decisions?: No  Permission Sought/Granted Permission sought to share information with : Case Manager Permission granted to share information with : Yes, Verbal Permission Granted  Share Information with NAME: Case Manager     Permission granted to share info w Relationship: Jeet Shough (spouse) [phone]     Emotional Assessment Appearance:: Appears stated age Attitude/Demeanor/Rapport: Gracious Affect (typically observed): Accepting Orientation: : Oriented to Self, Oriented to Place, Oriented to  Time, Oriented to Situation Alcohol / Substance Use: Not Applicable Psych Involvement: No (comment)  Admission diagnosis:  CVA (cerebral vascular accident) ([name]) [I63.9] Stroke ([name]) [I63.9] Generalized weakness [R53.1] Other speech disturbance [R47.89] Primary cancer of left upper lobe of lung ([name]) [C34.12] Cerebrovascular accident (CVA), unspecified mechanism ([name]) [I63.9] Patient Active Problem List   Diagnosis Date Noted   Wound eschar of foot 04/21/2023   Stroke ([name]) 04/21/2023   CVA (cerebral vascular accident) ([name]) 04/20/2023   Goals of care, counseling/discussion 11/24/2022   Pain due to onychomycosis of toenails of both feet 04/19/2022   Claudication ([name]) 01/13/2022   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   Port-A-Cath in place 07/08/2021   Hyperkalemia 04/28/2021   Elevated LFTs 04/28/2021   Falls 04/28/2021   Chronic back pain 04/28/2021   Hemoptysis 05/18/2020   Protein-calorie malnutrition, severe 05/07/2020   Abdominal pain  Necrotizing pneumonia ([name]) 05/03/2020   Bullous emphysema ([name])    Acute respiratory failure with hypoxia ([name]) 04/12/2020   Pneumonia due to COVID-19 virus 04/10/2020   Primary cancer of left upper lobe of lung ([name]) 04/10/2020   Cavitary pneumonia 04/10/2020   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    Essential hypertension 04/10/2020   HLD (hyperlipidemia) 04/10/2020   Sepsis ([name]) 04/10/2020   Recurrent spontaneous pneumothorax 06/01/2019   Malignant neoplasm of prostate ([name]) 05/20/2019   Hyponatremia 12/04/2017   AKI (acute kidney injury) ([name]) 12/04/2017   Incidental lung nodule, greater than or equal to 8mm 10/22/2017   COPD with acute exacerbation ([name])    Spontaneous pneumothorax 10/20/2017   Tobacco abuse    Alcohol abuse    PCP:  Dorena Gander, MD Pharmacy:   [facility] [phone] Jonette Nestle, [place] - [address] AT [facility] [address] [place] [address] Phone: [phone] Fax: [phone]  [place] [facility] [address] [place] [address] Phone: [phone] Fax: [phone]     Social Drivers of Health (SDOH) Social History: SDOH Screenings   Food Insecurity: No Food Insecurity (04/22/2023)  Housing: Low Risk  (04/22/2023)  Transportation Needs: No Transportation Needs (04/22/2023)  Recent Concern: Transportation Needs - Unmet Transportation Needs (04/21/2023)  Utilities: Not At Risk (04/22/2023)  Social Connections: Moderately Integrated (04/21/2023)  Tobacco Use: Medium Risk (04/21/2023)   SDOH Interventions: Food Insecurity Interventions: Intervention Not Indicated, Inpatient TOC Housing Interventions: Intervention Not Indicated, Inpatient TOC Transportation Interventions: Intervention Not Indicated, Inpatient TOC Utilities Interventions: Intervention Not Indicated, Inpatient TOC   Readmission Risk Interventions    04/29/2021    2:00 PM  Readmission Risk Prevention Plan  Transportation Screening Complete  PCP or Specialist Appt within 5-7 Days Complete  Home Care Screening Complete  Medication Review (RN CM) Complete

## 2023-04-22 NOTE — Progress Notes (Signed)
   04/21/23 2140  Assess: MEWS Score  Temp 98.3 F (36.8 C)  BP 108/76  MAP (mmHg) 87  Pulse Rate (!) 121  Resp 16  SpO2 99 %  O2 Device Room Air  Assess: MEWS Score  MEWS Temp 0  MEWS Systolic 0  MEWS Pulse 2  MEWS RR 0  MEWS LOC 0  MEWS Score 2  MEWS Score Color Yellow  Assess: if the MEWS score is Yellow or Red  Were vital signs accurate and taken at a resting state? Yes  Does the patient meet 2 or more of the SIRS criteria? No  MEWS guidelines implemented  Yes, yellow  Treat  MEWS Interventions Considered administering scheduled or prn medications/treatments as ordered  Take Vital Signs  Increase Vital Sign Frequency  Yellow: Q2hr x1, continue Q4hrs until patient remains green for 12hrs  Escalate  MEWS: Escalate Yellow: Discuss with charge nurse and consider notifying provider and/or RRT  Notify: Charge Nurse/RN  Name of Charge Nurse/RN Notified Kingsley Penny, RN  Provider Notification  Provider Name/Title Bailey Lesser, NP  Date Provider Notified 04/21/23  Time Provider Notified 2220  Method of Notification Page (Chat)  Notification Reason Other (Comment) (ST)  Provider response No new orders  Date of Provider Response 04/21/23  Time of Provider Response 2240  Assess: SIRS CRITERIA  SIRS Temperature  0  SIRS Respirations  0  SIRS Pulse 1  SIRS WBC 0  SIRS Score Sum  1

## 2023-04-22 NOTE — Progress Notes (Signed)
 Mobility Specialist - Progress Note  Pre-mobility: 108 bpm HR, During mobility: 160 bpm HR,  Post-mobility: 131 bpm HR,   04/22/23 1213  Mobility  Activity Ambulated with assistance in hallway  Level of Assistance Standby assist, set-up cues, supervision of patient - no hands on  Assistive Device Cane  Distance Ambulated (ft) 100 ft  Range of Motion/Exercises Active  Activity Response Tolerated fair  Mobility Referral Yes  Mobility visit 1 Mobility  Mobility Specialist Start Time (ACUTE ONLY) 1155  Mobility Specialist Stop Time (ACUTE ONLY) 1213  Mobility Specialist Time Calculation (min) (ACUTE ONLY) 18 min   Pt was found in bed and agreeable to ambulate. During session stated having harder work of breathing HR noticed to be 160 bpm. Took a standing rest break for ~78min HR remained 140s. Proceeded to sit in near by chair and HR able to decrease to 121 bpm in . Pt ambulated back to room and HR elevated to 145 bpm. HR remained in 130s bpm despite being seated EOB for several minutes. MD notified via secure chat.  Lorna Rose Mobility Specialist

## 2023-04-22 NOTE — Progress Notes (Signed)
 PROGRESS NOTE    Terry Page.  KGM:[id number] DOB: 1955-03-26 DOA: 04/20/2023 PCP: Dorena Gander, MD  Chief Complaint  Patient presents with   Aphasia    Pt arrives from cancer center with complaints of ongoing weakness, sobx1 week. Per CC, pt has slurred speech and favoring L side, no c/o chest pain    Brief Narrative:   Terry Page. is Terry Page 68 y.o. 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  He presented for his usual chemotherapy today and was noted to have weakness for the last week as well as slurred speech.   MRI showed findings concerning for stroke.   Assessment & Plan:   Principal Problem:   CVA (cerebral vascular accident) ([name]) Active Problems:   Primary cancer of left upper lobe of lung ([name])   Essential hypertension   HLD (hyperlipidemia)   Wound eschar of foot   Stroke ([name])   AMS (altered mental status)   Pressure injury of skin  CVA (cerebral vascular accident) ([name]) MRI w/wo showing new round nonenhancing 7 mm focus of mild DWI hyperintensity in the medial L cerebellum.  Indeterminate, but may represent recent or subacute infarct.  Needs short interval follow up MRI with contrast in 4-6 weeks to ensure expected evolution. Carotid US , echo pending PT/OT/SLP A1c pending, LDL 78 Continue aspirin , increase lipitor to 40 mg Appreciate neurology assistance - plan to continue aspirin  325 mg daily (holding off on plavix).  Repeat MRI brain w and w/o in 4-6 weeks to ensure expected evolution.    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  MRI without edema or enhancement to suggest metastasis, but short interval followup up MRI head with contrast in 4-6 weeks recommended given history. On Tylenol  and tramadol  every 4-6 hours for pain. Takes dex prior to chemo   Leukocytosis Unclear cause, trend  Hypokalemia Replace and follow  Anemia Trend  Bilateral Heel Wounds Wound/ostomy to evaluate in the morning    DVT prophylaxis: aspirin  Code Status: full Family Communication: none Disposition:   Status is: Observation The patient remains OBS appropriate and will d/c before 2 midnights.   Consultants:  neurology  Procedures:  none  Antimicrobials:  Anti-infectives (From admission, onward)    None       Subjective: No complaints  Objective: Vitals:   04/21/23 2331 04/22/23 0330 04/22/23 0748 04/22/23 1319  BP: 117/81 126/88 129/87 110/82  Pulse: (!) 118 (!) 104 (!) 115 (!) 117  Resp: 18  16 18   Temp: 98.4 F (36.9 C) 97.8 F (36.6 C) 97.9 F (36.6 C) 98.3 F (36.8 C)  TempSrc: Oral Oral Oral Oral  SpO2: 99% 100% 99% 100%  Weight:      Height:        Intake/Output Summary (Last 24 hours) at 04/22/2023 1723 Last data filed at 04/22/2023 1500 Gross per 24 hour  Intake 1103 ml  Output --  Net 1103 ml   Filed Weights   04/21/23 1429  Weight: 64.8 kg  Examination:  General: No acute distress. Cardiovascular: RRR Lungs: unlabored Abdomen: Soft, nontender, nondistended  Neurological: Alert and oriented 3. Moves all extremities 4 with equal strength. Cranial nerves II through XII grossly intact. Extremities: No clubbing or cyanosis. No edema.    Data Reviewed: I have personally reviewed following labs and imaging studies  CBC: Recent Labs  Lab 04/20/23 0921 04/20/23 1044 04/20/23 1236 04/21/23 0554 04/22/23 0407  WBC 9.4 9.6  --  15.3* 16.0*  NEUTROABS 8.3* 8.3*  --   --  11.7*  HGB 10.2* 10.4* 10.5*  10.0* 9.1*  HCT 31.3* 33.5* 31.0* 31.2* 29.7*  MCV 84.4 88.6  --  88.6 91.1  PLT 300 305  --  316 295    Basic Metabolic Panel: Recent Labs  Lab 04/20/23 0921 04/20/23 1044 04/20/23 1236 04/21/23 0554 04/22/23 0407  NA 133* 131* 133* 133* 134*  K 3.8 3.5 3.4* 3.1* 4.0  CL 101 101 99 99 104  CO2 24 22  --  24 25  GLUCOSE 135* 148* 126* 97 101*  BUN 13 14 12 12 14   CREATININE 0.83 0.86 0.90 0.88 0.86  CALCIUM  8.8* 8.7*  --  8.6* 8.6*  MG  --   --   --   --  1.6*  PHOS  --   --   --   --  1.9*    GFR: Estimated Creatinine Clearance: 74.3 mL/min (by C-G formula based on SCr of 0.86 mg/dL).  Liver Function Tests: Recent Labs  Lab 04/20/23 0921 04/20/23 1044 04/22/23 0407  AST 13* 17 15  ALT 13 16 13   ALKPHOS 57 56 46  BILITOT 0.4 0.7 0.2  PROT 6.8 6.7 5.4*  ALBUMIN 3.4* 2.9* 2.5*    CBG: Recent Labs  Lab 04/20/23 1037  GLUCAP 121*     Recent Results (from the past 240 hours)  Resp panel by RT-PCR (RSV, Flu Terry Page&B, Covid) Anterior Nasal Swab     Status: None   Collection Time: 04/20/23 11:58 AM   Specimen: Anterior Nasal Swab  Result Value Ref Range Status   SARS Coronavirus 2 by RT PCR NEGATIVE NEGATIVE Final    Comment: (NOTE) SARS-CoV-2 target nucleic acids are NOT DETECTED.  The SARS-CoV-2 RNA is generally detectable in upper respiratory specimens during the acute phase of infection. The lowest concentration of SARS-CoV-2 viral copies this assay can detect is 138 copies/mL. Terry Page negative result does not preclude SARS-Cov-2 infection and should not be used as the sole basis for treatment or other patient management decisions. Terry Page negative result may occur with  improper specimen collection/handling, submission of specimen other than nasopharyngeal swab, presence of viral mutation(s) within the areas targeted by this assay, and inadequate number of viral copies(<138 copies/mL). Terry Page negative result must be combined with clinical observations, patient history,  and epidemiological information. The expected result is Negative.  Fact Sheet for Patients:  BloggerCourse.com  Fact Sheet for Healthcare Providers:  SeriousBroker.it  This test is no t yet approved or cleared by the [place]  FDA and  has been authorized for detection and/or diagnosis of SARS-CoV-2 by FDA under an Emergency Use Authorization (EUA). This EUA will remain  in effect (meaning this test can be used) for the duration of the COVID-19 declaration under Section 564(b)(1) of the Act, 21 U.S.C.section 360bbb-3(b)(1), unless the authorization is terminated  or revoked sooner.       Influenza Terry Page by PCR NEGATIVE NEGATIVE Final   Influenza B by PCR NEGATIVE NEGATIVE Final  Comment: (NOTE) The Xpert Xpress SARS-CoV-2/FLU/RSV plus assay is intended as an aid in the diagnosis of influenza from Nasopharyngeal swab specimens and should not be used as Terry Page sole basis for treatment. Nasal washings and aspirates are unacceptable for Xpert Xpress SARS-CoV-2/FLU/RSV testing.  Fact Sheet for Patients: BloggerCourse.com  Fact Sheet for Healthcare Providers: SeriousBroker.it  This test is not yet approved or cleared by the [place]  FDA and has been authorized for detection and/or diagnosis of SARS-CoV-2 by FDA under an Emergency Use Authorization (EUA). This EUA will remain in effect (meaning this test can be used) for the duration of the COVID-19 declaration under Section 564(b)(1) of the Act, 21 U.S.C. section 360bbb-3(b)(1), unless the authorization is terminated or revoked.     Resp Syncytial Virus by PCR NEGATIVE NEGATIVE Final    Comment: (NOTE) Fact Sheet for Patients: BloggerCourse.com  Fact Sheet for Healthcare Providers: SeriousBroker.it  This test is not yet approved or cleared by the [place]  FDA and has  been authorized for detection and/or diagnosis of SARS-CoV-2 by FDA under an Emergency Use Authorization (EUA). This EUA will remain in effect (meaning this test can be used) for the duration of the COVID-19 declaration under Section 564(b)(1) of the Act, 21 U.S.C. section 360bbb-3(b)(1), unless the authorization is terminated or revoked.  Performed at [facility], [address], [place], [place] [address]   Blood culture (routine x 2)     Status: None (Preliminary result)   Collection Time: 04/20/23 12:28 PM   Specimen: Left Antecubital; Blood  Result Value Ref Range Status   Specimen Description   Final    LEFT ANTECUBITAL Performed at [facility], [address], [place], [place] [address]    Special Requests   Final    BOTTLES DRAWN AEROBIC AND ANAEROBIC Blood Culture adequate volume Performed at [facility], [address] [place], [place] [address]    Culture   Final    NO GROWTH 2 DAYS Performed at [facility], [address], [place], [place] [address]    Report Status PENDING  Incomplete  Blood culture (routine x 2)     Status: None (Preliminary result)   Collection Time: 04/20/23 12:28 PM   Specimen: Porta Cath; Blood  Result Value Ref Range Status   Specimen Description   Final    PORTA CATH Performed at [facility], [address], [place], [place] [address]    Special Requests   Final    BOTTLES DRAWN AEROBIC AND ANAEROBIC Blood Culture adequate volume Performed at [facility], [address] [place], [place] [address]    Culture   Final    NO GROWTH 2 DAYS Performed at [facility], [address], [place], [place] [address]    Report Status PENDING  Incomplete         Radiology Studies: VAS US  LOWER EXTREMITY VENOUS (DVT) Result Date: 04/22/2023  Lower Venous DVT Study Patient Name:  Terry Page.  Date of Exam:   04/21/2023  Medical Rec #: [id number]             Accession #:    [id number] Date of Birth: [birth year]              Patient Gender: M Patient Age:   40 years Exam Location[facility] Procedure:      VAS US  LOWER EXTREMITY VENOUS (DVT) Referring Phys: Baldwin Levee --------------------------------------------------------------------------------  Indications: Stroke.  Risk Factors: Cancer Stage IV lung and prostate cancer with mets to bones, on chemo. Limitations: Edema and Shadowing from arterial calcification. Comparison Study: No prior study on file. Performing Technologist: Ria Chad  Examination Guidelines: Kennet Mccort complete evaluation includes B-mode imaging, spectral Doppler, color Doppler, and power Doppler as needed of all accessible portions of each vessel. Bilateral testing is considered an integral part of Kayelynn Abdou complete examination. Limited examinations for reoccurring indications may be performed as noted. The reflux portion of the exam is performed with the patient in reverse Trendelenburg.  +---------+---------------+---------+-----------+----------+--------------+ RIGHT    CompressibilityPhasicitySpontaneityPropertiesThrombus Aging +---------+---------------+---------+-----------+----------+--------------+ CFV      Full           Yes      Yes                                 +---------+---------------+---------+-----------+----------+--------------+ SFJ      Full           Yes      Yes                                 +---------+---------------+---------+-----------+----------+--------------+ FV Prox  Full                                                        +---------+---------------+---------+-----------+----------+--------------+ FV Mid   Full                                                        +---------+---------------+---------+-----------+----------+--------------+ FV DistalFull                                                         +---------+---------------+---------+-----------+----------+--------------+ PFV      Full                                                        +---------+---------------+---------+-----------+----------+--------------+ POP      Full           Yes      Yes                                 +---------+---------------+---------+-----------+----------+--------------+ PTV      Full                                                        +---------+---------------+---------+-----------+----------+--------------+ PERO     Full                                                        +---------+---------------+---------+-----------+----------+--------------+   +---------+---------------+---------+-----------+----------+--------------+  LEFT     CompressibilityPhasicitySpontaneityPropertiesThrombus Aging +---------+---------------+---------+-----------+----------+--------------+ CFV      Full           Yes      Yes                                 +---------+---------------+---------+-----------+----------+--------------+ SFJ      Full           Yes      Yes                                 +---------+---------------+---------+-----------+----------+--------------+ FV Prox  Full                                                        +---------+---------------+---------+-----------+----------+--------------+ FV Mid   Full                                                        +---------+---------------+---------+-----------+----------+--------------+ FV DistalFull                                                        +---------+---------------+---------+-----------+----------+--------------+ PFV      Full                                                        +---------+---------------+---------+-----------+----------+--------------+ POP      Full           Yes      Yes                                  +---------+---------------+---------+-----------+----------+--------------+ PTV      Full                                                        +---------+---------------+---------+-----------+----------+--------------+ PERO     Full                                                        +---------+---------------+---------+-----------+----------+--------------+     Summary: RIGHT: - There is no evidence of deep vein thrombosis in the lower extremity.  - No cystic structure found in the popliteal fossa.  LEFT: - There is no evidence of deep vein thrombosis in the lower extremity.  - No cystic structure found in the popliteal  fossa.  *See table(s) above for measurements and observations. Electronically signed by Runell Countryman on 04/22/2023 at 3:52:32 PM.    Final    VAS US  CAROTID (at [facility] and [facility] only) Result Date: 04/22/2023 Carotid Arterial Duplex Study Patient Name:  Terry Page.  Date of Exam:   04/21/2023 Medical Rec #: [id number]             Accession #:    [id number] Date of Birth: 1955/07/25              Patient Gender: M Patient Age:   46 years Exam Location:  [facility] Procedure:      VAS US  CAROTID Referring Phys: Bertell Broach PRATT --------------------------------------------------------------------------------  Indications:       CVA, Carotid artery disease, Speech disturbance and Weakness. Risk Factors:      Hypertension, current smoker. Other Factors:     Stage IV lung and prostate cancer with mets to bones, on                    chemo.                    CT noted approximately 70-80% stenosis in bilateral carotid                    arteries. Comparison Study:  No prior exam. on file. Performing Technologist: Ria Chad  Examination Guidelines: Kathlean Cinco complete evaluation includes B-mode imaging, spectral Doppler, color Doppler, and power Doppler as needed of all accessible portions of each vessel. Bilateral testing is considered an integral part of Daqwan Dougal complete examination.  Limited examinations for reoccurring indications may be performed as noted.  Right Carotid Findings: +----------+--------+--------+--------+-------------------------+--------+           PSV cm/sEDV cm/sStenosisPlaque Description       Comments +----------+--------+--------+--------+-------------------------+--------+ CCA Prox  78      23                                                +----------+--------+--------+--------+-------------------------+--------+ CCA Mid   60      13              hyperechoic and calcific          +----------+--------+--------+--------+-------------------------+--------+ CCA Distal62      24                                                +----------+--------+--------+--------+-------------------------+--------+ ICA Prox  92      41              heterogenous and calcific         +----------+--------+--------+--------+-------------------------+--------+ ICA Mid   188     82      60-79%  calcific                          +----------+--------+--------+--------+-------------------------+--------+ ICA Distal100     38                                                +----------+--------+--------+--------+-------------------------+--------+  ECA       73      15              hyperechoic and calcific          +----------+--------+--------+--------+-------------------------+--------+ +----------+--------+-------+----------------+-------------------+           PSV cm/sEDV cmsDescribe        Arm Pressure (mmHG) +----------+--------+-------+----------------+-------------------+ Subclavian122     19     Diffused plaque.                    +----------+--------+-------+----------------+-------------------+ +---------+--------+---+--------+--+ VertebralPSV cm/s120EDV cm/s36 +---------+--------+---+--------+--+  Left Carotid Findings: +----------+--------+--------+--------+-------------------------+--------+           PSV cm/sEDV  cm/sStenosisPlaque Description       Comments +----------+--------+--------+--------+-------------------------+--------+ CCA Prox                  Occluded                                  +----------+--------+--------+--------+-------------------------+--------+ CCA Mid                   Occluded                                  +----------+--------+--------+--------+-------------------------+--------+ CCA [id number]             >50%                                      +----------+--------+--------+--------+-------------------------+--------+ ICA Prox  109     47      40-59%  heterogenous and calcific         +----------+--------+--------+--------+-------------------------+--------+ ICA Mid   47      28                                                +----------+--------+--------+--------+-------------------------+--------+ ICA Distal37      22                                                +----------+--------+--------+--------+-------------------------+--------+ ECA       216     102     >50%    heterogenous and calcific         +----------+--------+--------+--------+-------------------------+--------+ +----------+--------+--------+----------------+-------------------+           PSV cm/sEDV cm/sDescribe        Arm Pressure (mmHG) +----------+--------+--------+----------------+-------------------+ [id number]     24      Diffused plaque.                    +----------+--------+--------+----------------+-------------------+ +---------+--------+---+--------+--+------------------+ VertebralPSV cm/s125EDV cm/s48Stenotic at origin +---------+--------+---+--------+--+------------------+   Summary: Right Carotid: Velocities in the right ICA are consistent with Becka Lagasse 60-79%                stenosis. Left Carotid: Velocities in the left ICA are consistent with Brandley Aldrete 40-59% stenosis.               The ECA appears >50% stenosed. Vertebrals:  Bilateral vertebral  arteries demonstrate antegrade flow. Left:              elevated velocities. Subclavians: Left subclavian artery was stenotic. Normal flow hemodynamics were              seen in the right subclavian artery. *See table(s) above for measurements and observations.  Suggest Peripheral Vascular Consult. Electronically signed by Ardella Beaver MD on 04/22/2023 at 10:50:30 AM.    Final    CT ANGIO HEAD NECK W WO CM Result Date: 04/21/2023 CLINICAL DATA:  Mental status change of unknown cause. Abnormal cerebellar finding on MRI yesterday. EXAM: CT ANGIOGRAPHY HEAD AND NECK WITH AND WITHOUT CONTRAST TECHNIQUE: Multidetector CT imaging of the head and neck was performed using the standard protocol during bolus administration of intravenous contrast. Multiplanar CT image reconstructions and MIPs were obtained to evaluate the vascular anatomy. Carotid stenosis measurements (when applicable) are obtained utilizing NASCET criteria, using the distal internal carotid diameter as the denominator. RADIATION DOSE REDUCTION: This exam was performed according to the departmental dose-optimization program which includes automated exposure control, adjustment of the mA and/or kV according to patient size and/or use of iterative reconstruction technique. CONTRAST:  75mL OMNIPAQUE  IOHEXOL  350 MG/ML SOLN COMPARISON:  MRI 04/20/2023. Neck CT 04/20/2023. Head CT 04/20/2023. Brain MRI 05/05/2021. FINDINGS: CT HEAD FINDINGS Brain: Brain detail is limited by some technical deficiency. Previously described peripheral left cerebellar lesion is not visible on this exam. No sign of acute stroke, mass, hemorrhage, hydrocephalus or extra-axial collection. 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 Vascular: No abnormal vascular finding. 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 Sinuses/Orbits: Clear/normal Other: None Review of the  MIP images confirms the above findings CTA NECK FINDINGS Aortic arch: Aortic atherosclerosis.  Branching pattern is normal. Right carotid system: Common carotid artery shows Dajour Pierpoint 30% stenosis at its origin from the innominate artery. Beyond that, there is scattered plaque but the vessel is patent to the bifurcation. Dense calcified plaque at the carotid bifurcation and ICA bulb. Multiple serial stenoses. In the distal bulb, minimal diameter is 1 mm or less. Compared to Inice Sanluis more distal cervical ICA diameter of 5 mm, this indicates an 80% or greater stenosis. Left carotid system: Common carotid artery shows severe stenosis at its origin, 80% or greater. Vessel is occluded 2 cm beyond that in the upper mediastinum. No flow within the left common carotid artery beyond that. Dense calcified plaque at the carotid bifurcation and ICA bulb. There is reconstituted flow at this level, probably from external to internal collateral vessels. Beyond the atherosclerotic disease at the bifurcation, the ICA is patent 2 in through the skull base as Jamilia Jacques small vessel, diameter 2.8 mm. Vertebral arteries: No flow limiting proximal subclavian stenosis on the right. Calcified plaque at the right vertebral artery origin and affecting the proximal vertebral artery extensively with multiple serial stenoses. Greatest stenosis in the mid cervical region is probably Judith Campillo 70-80%. The vessel does show flow to the skull base and foramen magnum level. On the left, there is moderate stenosis of the proximal subclavian artery, estimated at 50%. The left vertebral artery origin is widely patent and the vessel shows some scattered plaque but is patent through the cervical region without stenosis greater than 30%. Skeleton: Ordinary cervical spondylosis. Other neck: No mass or lymphadenopathy. Upper chest: Advanced emphysema and pulmonary scarring. Spiculated lesion in the left upper lobe  as evaluated on chest CT yesterday. Please see results of prior chest CT  exam. Review of the MIP images confirms the above findings CTA HEAD FINDINGS Anterior circulation: Both internal carotid arteries are patent through the skull base. There is advanced siphon atherosclerotic disease with multiple stenoses on both sides. Serial stenoses are estimated in the range of 50-70%. The anterior and middle cerebral vessels show flow. No large vessel occlusion. Distal vessels show some atherosclerotic irregularity. Posterior circulation: Both vertebral arteries are patent through the foramen magnum to the basilar artery. Venous sinuses: Patent and normal. Anatomic variants: None significant. Review of the MIP images confirms the above findings IMPRESSION: 1. No acute head CT finding. Previously described peripheral left cerebellar lesion is not visible on this exam due to technical deficiency. 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 2. 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 3. Aortic atherosclerosis. 4. 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 Vessel is occluded 2 cm beyond that in the upper mediastinum. No flow within the left common carotid artery beyond that. Dense calcified plaque at the left carotid bifurcation and ICA bulb. Reconstituted flow at this level, probably from external to internal collateral vessels. Beyond the atherosclerotic disease at the bifurcation, the ICA is patent to the skull base as Kalia Vahey small vessel, diameter 2.8 mm. 5. Advanced atherosclerotic disease at the right carotid bifurcation and ICA bulb. Multiple serial stenoses. In the distal bulb, minimal diameter is 1 mm or less. Compared to Lavonia Eager more distal cervical ICA diameter of 5 mm, this indicates an 80% or greater stenosis. 6. Extensive atherosclerotic disease of the proximal right vertebral artery with multiple serial stenoses. Greatest stenosis in the mid  cervical region is probably Errick Salts 70-80%. The vessel does show flow to the skull base and foramen magnum level. 7. 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 8. Advanced atherosclerotic disease of both carotid siphons with multiple stenoses on both sides. Serial stenoses are estimated in the range of 50-70%. 9. No acute intracranial large or medium vessel occlusion. The left cerebellar lesion remains indeterminate at this time for Aarish Rockers late subacute stroke versus an atypical metastasis. Additionally on the MRI, there is an up to 12 mm newly seen lesion in the region of the left cerebellar flocculus that was not there in 05-09-21. Although this is nonenhancing, it is viewed with some suspicion. It could be Yue Glasheen newly developed cyst, though I do not know why that would happen. Therefore, this should also be observed for the possibility of Svara Twyman metastasis. Aortic Atherosclerosis (ICD10-I70.0) and Emphysema (ICD10-J43.9). Electronically Signed   By: Bettylou Brunner M.D.   On: 04/21/2023 13:10   ECHOCARDIOGRAM COMPLETE Result Date: 04/21/2023    ECHOCARDIOGRAM REPORT   Patient Name:   Terry Page. Date of Exam: 04/21/2023 Medical Rec #:  [id number]            Height:       69.0 in Accession #:    [id number]           Weight:       142.9 lb Date of Birth:  1955/10/13             BSA:          1.791 m Patient Age:    36 years  BP:           149/98 mmHg Patient Gender: M                    HR:           102 bpm. Exam Location:  Inpatient Procedure: 2D Echo, Cardiac Doppler, Color Doppler and Intracardiac            Opacification Agent (Both Spectral and Color Flow Doppler were            utilized during procedure). Indications:    Stroke  History:        Patient has prior history of Echocardiogram examinations. Risk                 Factors:Former Smoker and Hypertension.  Sonographer:    Willey Harrier Referring Phys: [id number] Maisie Scotland PRATT  Sonographer Comments: Suboptimal parasternal  window, suboptimal apical window and Technically difficult study due to poor echo windows. Image acquisition challenging due to respiratory motion. IMPRESSIONS  1. Left ventricular ejection fraction, by estimation, is 60 to 65%. The left ventricle has normal function. The left ventricle has no regional wall motion abnormalities. Indeterminate diastolic filling due to E-Kaniesha Barile fusion.  2. Right ventricular systolic function is normal. The right ventricular size is normal. Tricuspid regurgitation signal is inadequate for assessing PA pressure.  3. The mitral valve is grossly normal. No evidence of mitral valve regurgitation. No evidence of mitral stenosis.  4. The aortic valve is tricuspid. Aortic valve regurgitation is not visualized. No aortic stenosis is present.  5. Aortic dilatation noted. There is mild dilatation of the aortic root, measuring 41 mm. There is mild dilatation of the ascending aorta, measuring 41 mm.  6. The inferior vena cava is normal in size with greater than 50% respiratory variability, suggesting right atrial pressure of 3 mmHg. Conclusion(s)/Recommendation(s): No intracardiac source of embolism detected on this transthoracic study. Consider Jeanelle Dake transesophageal echocardiogram to exclude cardiac source of embolism if clinically indicated. FINDINGS  Left Ventricle: Left ventricular ejection fraction, by estimation, is 60 to 65%. The left ventricle has normal function. The left ventricle has no regional wall motion abnormalities. The left ventricular internal cavity size was normal in size. There is  no left ventricular hypertrophy. Indeterminate diastolic filling due to E-Camryn Lampson fusion. Right Ventricle: The right ventricular size is normal. No increase in right ventricular wall thickness. Right ventricular systolic function is normal. Tricuspid regurgitation signal is inadequate for assessing PA pressure. Left Atrium: Left atrial size was normal in size. Right Atrium: Right atrial size was not well  visualized. Pericardium: There is no evidence of pericardial effusion. Presence of epicardial fat layer. Mitral Valve: The mitral valve is grossly normal. No evidence of mitral valve regurgitation. No evidence of mitral valve stenosis. MV peak gradient, 3.3 mmHg. The mean mitral valve gradient is 1.0 mmHg. Tricuspid Valve: The tricuspid valve is grossly normal. Tricuspid valve regurgitation is trivial. No evidence of tricuspid stenosis. Aortic Valve: The aortic valve is tricuspid. Aortic valve regurgitation is not visualized. No aortic stenosis is present. Aortic valve peak gradient measures 3.6 mmHg. Pulmonic Valve: The pulmonic valve was grossly normal. Pulmonic valve regurgitation is not visualized. No evidence of pulmonic stenosis. Aorta: Aortic dilatation noted. There is mild dilatation of the aortic root, measuring 41 mm. There is mild dilatation of the ascending aorta, measuring 41 mm. Venous: The inferior vena cava is normal in size with greater than 50% respiratory variability, suggesting right atrial pressure  of 3 mmHg. IAS/Shunts: The interatrial septum was not well visualized.  LEFT VENTRICLE PLAX 2D LVIDd:         4.00 cm   Diastology LVIDs:         2.80 cm   LV e' medial:    5.77 cm/s LV PW:         1.10 cm   LV E/e' medial:  9.4 LV IVS:        1.00 cm   LV e' lateral:   7.07 cm/s LVOT diam:     2.10 cm   LV E/e' lateral: 7.7 LV SV:         49 LV SV Index:   27 LVOT Area:     3.46 cm  IVC IVC diam: 1.50 cm AORTIC VALVE AV Area (Vmax): 2.99 cm AV Vmax:        94.60 cm/s AV Peak Grad:   3.6 mmHg LVOT Vmax:      81.60 cm/s LVOT Vmean:     58.300 cm/s LVOT VTI:       0.141 m  AORTA Ao Root diam: 4.10 cm Ao Asc diam:  4.10 cm MITRAL VALVE MV Area (PHT): 4.71 cm    SHUNTS MV Area VTI:   3.90 cm    Systemic VTI:  0.14 m MV Peak grad:  3.3 mmHg    Systemic Diam: 2.10 cm MV Mean grad:  1.0 mmHg MV Vmax:       0.90 m/s MV Vmean:      57.3 cm/s MV Decel Time: 161 msec MV E velocity: 54.40 cm/s MV Willow Reczek velocity:  90.40 cm/s MV E/Javiel Canepa ratio:  0.60 Jackquelyn Mass MD Electronically signed by Jackquelyn Mass MD Signature Date/Time: 04/21/2023/12:32:53 PM    Final    MR Brain W and Wo Contrast Result Date: 04/20/2023 CLINICAL DATA:  Mental status change, unknown cause EXAM: MRI HEAD WITHOUT AND WITH CONTRAST TECHNIQUE: Multiplanar, multiecho pulse sequences of the brain and surrounding structures were obtained without and with intravenous contrast. CONTRAST:  6mL GADAVIST  GADOBUTROL  1 MMOL/ML IV SOLN COMPARISON:  Same day CT head.  MRI head May 4, 23. FINDINGS: Brain: Round, nonenhancing 7 mm focus of mild DWI hyperintensity in the medial left cerebellum (series 5, image 6 and series 8, image 4), which is new from prior. No substantial mass effect. No pathologic enhancement. No definite acute hemorrhage or hydrocephalus. Vascular: Major arterial flow voids are maintained skull base. Skull and upper cervical spine: Normal marrow signal. Sinuses/Orbits: Clear sinuses.  No acute orbital findings. IMPRESSION: Round, nonenhancing 7 mm focus of mild DWI hyperintensity in the medial left cerebellum, which is new from prior. This finding is indeterminate but may represent Benjerman Molinelli recent or subacute infarct. No associated edema or enhancement to suggest metastasis; however, given the clinical history, recommend short interval follow-up MRI head with contrast in 4-6 weeks to ensure expected evolution. Electronically Signed   By: Stevenson Elbe M.D.   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 History of COPD. Generalized weakness and slurred speech. Recent change to voice. EXAM: CT ANGIOGRAPHY CHEST WITH CONTRAST TECHNIQUE: Multidetector CT imaging of the chest was performed using the standard protocol during bolus administration of intravenous contrast. Multiplanar CT image reconstructions and MIPs were obtained to evaluate  the vascular anatomy. RADIATION DOSE REDUCTION: This exam was performed according to the departmental dose-optimization program which  includes automated exposure control, adjustment of the mA and/or kV according to patient size and/or use of iterative reconstruction technique. CONTRAST:  75mL OMNIPAQUE  IOHEXOL  350 MG/ML SOLN COMPARISON:  Same day chest radiograph and CT 02/15/2023 FINDINGS: Cardiovascular: No pericardial effusion. Negative for acute pulmonary embolism. Aortic and coronary artery atherosclerotic calcification. Right chest wall [place] Jarboe-Cath. Mediastinum/Nodes: Trachea and esophagus are unremarkable. Similar soft tissue thickening about the inferior left hilum compared to prior and likely representing post treatment change. Lungs/Pleura: Advanced paraseptal and centrilobular emphysema with bullous change in the left apex. Diffuse bronchial wall thickening. No pleural effusion or pneumothorax. Increased size of the irregular consolidation in the left apex now measuring 2.6 x 2.0 cm. This measured 2.4 x 1.3 cm on CT 02/15/2023 using similar measuring technique. This may represent progression of post treatment change however residual or recurrent malignancy is not excluded. Multiple pulmonary nodules are redemonstrated and are similar in size to 02/15/2023. Interval increase in size of Burk Hoctor subpleural anterior right upper lobe nodule measuring 6 mm nodule (13/108) previously measured 4 mm. Multiple new nodules in the left lower lobe measuring up to 6 mm (series 13/image 288). Upper Abdomen: Stable hypoattenuating lesions in the liver likely representing benign cyst. No acute abnormality in the visualized abdomen. Musculoskeletal: No acute fracture. Similar mixed lytic and sclerotic appearance of the left scapula. Review of the MIP images confirms the above findings. IMPRESSION: 1. Negative for acute pulmonary embolism. 2. Increased size of the irregular consolidation in the left apex now measuring 2.6 x 2.0  cm. This measured 2.4 x 1.3 cm on CT 02/15/2023. This may represent progression of post treatment change however residual or recurrent malignancy is not excluded. Recommend close interval follow-up or PET/CT. 3. Multiple new nodules in the left lower lobe measuring up to 6 mm. Interval increase in size of Bera Pinela subpleural anterior right upper lobe nodule measuring 6 mm previously measured 4 mm. Aortic Atherosclerosis (ICD10-I70.0) and Emphysema (ICD10-J43.9). Electronically Signed   By: Rozell Cornet M.D.   On: 04/20/2023 19:41   CT Soft Tissue Neck W Contrast Result Date: 04/20/2023 CLINICAL DATA:  Soft tissue infection suspected, generalized weakness and speech difficulty. History of metastatic prostate/lung cancer. EXAM: CT NECK WITH CONTRAST TECHNIQUE: Multidetector CT imaging of the neck was performed using the standard protocol following the bolus administration of intravenous contrast. RADIATION DOSE REDUCTION: This exam was performed according to the departmental dose-optimization program which includes automated exposure control, adjustment of the mA and/or kV according to patient size and/or use of iterative reconstruction technique. CONTRAST:  75mL OMNIPAQUE  IOHEXOL  350 MG/ML SOLN COMPARISON:  None Available. FINDINGS: Pharynx and larynx: Symmetric appearance of the nasopharynx. The palatine tonsils are relatively symmetric. Normal appearance of the oral cavity and floor of mouth. The base of tongue is unremarkable. The epiglottis is normal. No retropharyngeal effusion. There is no evidence of fluid collection or peritonsillar abscess. Symmetric appearance of the aryepiglottic folds and piriform sinuses. Normal appearance of the paraglottic fat. The vocal folds are symmetric. Salivary glands: There is slightly asymmetric hyperattenuation of the right parotid gland which may be artifactual. The parotid glands are relatively symmetric in size. There is no inflammatory stranding adjacent to the right  parotid gland. No ductal enlargement or evidence of abnormal calcification. The submandibular glands are symmetric. Thyroid : Normal. Lymph nodes: There are no enlarged lymph nodes in the neck. There is Jasher Barkan 1.5 x 0.9 x 0.8 cm cystic appearing focus within the right posterior neck involving cervical level 2 B/5 which  demonstrates internal fluid density. No definite associated soft tissue component. Vascular: Moderate atherosclerosis of the visualized aortic arch. There is prominent atherosclerosis involving the origin of the left subclavian artery resulting in at least moderate stenosis. Additional prominent atherosclerosis of distal left subclavian/proximal left axillary artery. Prominent calcified atherosclerosis involving the bilateral carotid bifurcations. There is likely severe stenosis at the origins of both internal carotid arteries. Additional atherosclerosis of the partially visualized carotid siphons. Right IJ approach central venous catheter noted. Limited intracranial: Negative. Visualized orbits: Negative. Mastoids and visualized paranasal sinuses: Mastoid air cells are clear. Mucosal thickening in the alveolar recesses of the maxillary sinuses. Skeleton: No acute or aggressive finding noted. Degenerative changes throughout the cervical spine. Upper chest: Emphysema. Scarring in the bilateral lung apices and in the left upper lobe. See separately dictated CTA chest for complete evaluation of intrathoracic findings. Other: None. IMPRESSION: No acute abnormality along the aero digestive structures in the neck. No evidence of abscess. Slightly asymmetric attenuation of the right parotid gland which may be artifactual. No adjacent inflammatory stranding. Recommend correlation with tenderness over the right parotid. 1.5 cm cystic focus in the right posterior neck may reflect Shekela Goodridge lymphangioma/lymphatic malformation. There is no soft tissue component to suggest necrotic lymph node. Recommend short interval follow-up  with CT or ultrasound of the neck. Atherosclerosis as above. 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 Consider CTA for further evaluation if clinically indicated. Electronically Signed   By: Denny Flack M.D.   On: 04/20/2023 18:26        Scheduled Meds:  aspirin   300 mg Rectal Daily   Or   aspirin   325 mg Oral Daily   atorvastatin   40 mg Oral Daily   Chlorhexidine  Gluconate Cloth  6 each Topical Daily   dronabinol   5 mg Oral BID AC   enoxaparin  (LOVENOX ) injection  40 mg Subcutaneous Q24H   folic acid   1 mg Oral Daily   irbesartan   300 mg Oral Daily   phosphorus  500 mg Oral QID   senna-docusate  2 tablet Oral BID   sodium chloride  flush  3 mL Intravenous Q12H   Continuous Infusions:     LOS: 0 days    Time spent: over 30 min    Donnetta Gains, MD [facility]   To contact the attending provider between 7A-7P or the covering provider during after hours 7P-7A, please log into the web site [email or web address] and access using universal [place] password for that web site. If you do not have the password, please call the hospital operator.  04/22/2023, 5:23 PM

## 2023-04-22 NOTE — Progress Notes (Signed)
 MD notified a couple of times during shift that patient's heart rate has been in the 150's-160's with one occurrence of spiking up to 195 but coming right back down. Patient's heart rate tends to hang around the 150's-160's when patient is up and moving around, but then slowly comes back down to the 100's-120's. RN has gotten two 12-lead-EKGs on patient and MD notified. MD ordered 1000 mL LR IV Bolus. Despite after getting the bolus, the patient's heart rate was still hanging around the 100's-120's. Patient has been asymptomatic the entire shift with the elevated heart rate.

## 2023-04-22 NOTE — Progress Notes (Signed)
   04/22/23 1051  Orthostatic Lying   BP- Lying 121/89  Pulse- Lying 108  Orthostatic Sitting  BP- Sitting 108/77  Pulse- Sitting 131  Orthostatic Standing at 0 minutes  BP- Standing at 0 minutes 119/84 (Taken at 1056)  Pulse- Standing at 0 minutes 143  Orthostatic Standing at 3 minutes  BP- Standing at 3 minutes 116/87 (Taken at 1059)  Pulse- Standing at 3 minutes 146

## 2023-04-22 NOTE — Evaluation (Signed)
 Occupational Therapy Evaluation Patient Details Name: Terry Page. MRN: [id number] DOB: [birth year] Today's Date: 04/22/2023   History of Present Illness   Terry Page. is a 68 yr old male who  presented  04/20/23 for his usual chemotherapy  and was noted to have weakness and  slurred speech, favoring left side. 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     Clinical Impressions The pt completed all assessed tasks with modified independence or better, including supine to sit, doffing and donning socks seated EOB, sit to stand, and transferring onto and off the toilet. He appears to be at or very near to his baseline level of functioning for self-care management, and he is not presenting with deficits that would warrant the need for OT services. OT will sign off.      If plan is discharge home, recommend the following:   Assist for transportation     Functional Status Assessment   Patient has not had a recent decline in their functional status     Equipment Recommendations   None recommended by OT     Recommendations for Other Services         Precautions/Restrictions   Restrictions Weight Bearing Restrictions Per Provider Order: No     Mobility Bed Mobility Overal bed mobility: Modified Independent                  Transfers Overall transfer level: Independent Equipment used: None Transfers: Sit to/from Stand Sit to Stand: Independent                  Balance     Sitting balance-Leahy Scale: Good       Standing balance-Leahy Scale: Fair         ADL either performed or assessed with clinical judgement   ADL Overall ADL's : Independent;At baseline;Modified independent         Vision Baseline Vision/History: 1 Wears glasses Additional Comments: He correctly read the time depicted on the wall clock.             Pertinent Vitals/Pain Pain Assessment Pain Assessment: No/denies pain     Extremity/Trunk Assessment Upper Extremity Assessment Upper Extremity Assessment: Overall WFL for tasks assessed;Right hand dominant   Lower Extremity Assessment Lower Extremity Assessment: Overall WFL for tasks assessed       Communication Communication Communication: No apparent difficulties   Cognition Arousal: Alert Behavior During Therapy: WFL for tasks assessed/performed Cognition: No apparent impairments            Following commands: Intact                  Home Living Family/patient expects to be discharged to:: Private residence Living Arrangements: Alone   Type of Home: Apartment Home Access: Level entry     Home Layout: One level     Bathroom Shower/Tub: Tub/shower unit         Home Equipment: Cane - single point;Tub bench;Rollator (4 wheels)          Prior Functioning/Environment Prior Level of Function : Independent/Modified Independent             Mobility Comments: He used a cane for ambulation. ADLs Comments: Independent with ADLs, cooking, and cleaning. He does not drive.       OT Treatment/Interventions:   No treatment needs identified  OT Goals(Current goals can be found in the care plan section)   Acute Rehab OT Goals OT Goal Formulation: All assessment and education complete, DC therapy   OT Frequency:   N/A       AM-PAC OT "6 Clicks" Daily Activity     Outcome Measure Help from another person eating meals?: None Help from another person taking care of personal grooming?: None Help from another person toileting, which includes using toliet, bedpan, or urinal?: None Help from another person bathing (including washing, rinsing, drying)?: None Help from another person to put on and taking off regular upper body clothing?: None Help from another person to put on and taking off regular lower body clothing?: None 6 Click  Score: 24   End of Session Equipment Utilized During Treatment: Other (comment) (N/A) Nurse Communication: Other (comment) (pt requesting to get dressed for possible discharge)  Activity Tolerance: Patient tolerated treatment well Patient left: in bed;with call bell/phone within reach  OT Visit Diagnosis: Muscle weakness (generalized) (M62.81)                Time: 1610-9604 OT Time Calculation (min): 12 min Charges:  OT General Charges $OT Visit: 1 Visit OT Evaluation $OT Eval Low Complexity: 1 Low    Kalie Cabral L Lakiah Dhingra, OTR/L 04/22/2023, 2:21 PM

## 2023-04-23 ENCOUNTER — Encounter: Payer: Self-pay | Admitting: Hematology and Oncology

## 2023-04-23 ENCOUNTER — Ambulatory Visit: Payer: Medicare HMO

## 2023-04-23 ENCOUNTER — Other Ambulatory Visit (HOSPITAL_COMMUNITY): Payer: Self-pay

## 2023-04-23 DIAGNOSIS — I639 Cerebral infarction, unspecified: Secondary | ICD-10-CM | POA: Diagnosis not present

## 2023-04-23 LAB — CBC WITH DIFFERENTIAL/PLATELET
Abs Immature Granulocytes: 0.2 10*3/uL — ABNORMAL HIGH (ref 0.00–0.07)
Basophils Absolute: 0.1 10*3/uL (ref 0.0–0.1)
Basophils Relative: 0 %
Eosinophils Absolute: 0.1 10*3/uL (ref 0.0–0.5)
Eosinophils Relative: 1 %
HCT: 28.9 % — ABNORMAL LOW (ref 39.0–52.0)
Hemoglobin: 9 g/dL — ABNORMAL LOW (ref 13.0–17.0)
Immature Granulocytes: 1 %
Lymphocytes Relative: 10 %
Lymphs Abs: 1.8 10*3/uL (ref 0.7–4.0)
MCH: 28.3 pg (ref 26.0–34.0)
MCHC: 31.1 g/dL (ref 30.0–36.0)
MCV: 90.9 fL (ref 80.0–100.0)
Monocytes Absolute: 2.3 10*3/uL — ABNORMAL HIGH (ref 0.1–1.0)
Monocytes Relative: 12 %
Neutro Abs: 14.2 10*3/uL — ABNORMAL HIGH (ref 1.7–7.7)
Neutrophils Relative %: 76 %
Platelets: 303 10*3/uL (ref 150–400)
RBC: 3.18 MIL/uL — ABNORMAL LOW (ref 4.22–5.81)
RDW: 22.5 % — ABNORMAL HIGH (ref 11.5–15.5)
WBC: 18.7 10*3/uL — ABNORMAL HIGH (ref 4.0–10.5)
nRBC: 0 % (ref 0.0–0.2)

## 2023-04-23 LAB — COMPREHENSIVE METABOLIC PANEL WITH GFR
ALT: 15 U/L (ref 0–44)
AST: 19 U/L (ref 15–41)
Albumin: 2.4 g/dL — ABNORMAL LOW (ref 3.5–5.0)
Alkaline Phosphatase: 49 U/L (ref 38–126)
Anion gap: 7 (ref 5–15)
BUN: 15 mg/dL (ref 8–23)
CO2: 25 mmol/L (ref 22–32)
Calcium: 8 mg/dL — ABNORMAL LOW (ref 8.9–10.3)
Chloride: 102 mmol/L (ref 98–111)
Creatinine, Ser: 0.9 mg/dL (ref 0.61–1.24)
GFR, Estimated: >60 mL/min (ref >60)
Glucose, Bld: 104 mg/dL — ABNORMAL HIGH (ref 70–99)
Potassium: 3.5 mmol/L (ref 3.5–5.1)
Sodium: 134 mmol/L — ABNORMAL LOW (ref 135–145)
Total Bilirubin: 0.4 mg/dL (ref 0.0–1.2)
Total Protein: 5.1 g/dL — ABNORMAL LOW (ref 6.5–8.1)

## 2023-04-23 LAB — BRAIN NATRIURETIC PEPTIDE: B Natriuretic Peptide: 98.6 pg/mL (ref 0.0–100.0)

## 2023-04-23 LAB — PHOSPHORUS: Phosphorus: 4.7 mg/dL — ABNORMAL HIGH (ref 2.5–4.6)

## 2023-04-23 LAB — HEMOGLOBIN A1C
Hgb A1c MFr Bld: 6 % — ABNORMAL HIGH (ref 4.8–5.6)
Mean Plasma Glucose: 126 mg/dL

## 2023-04-23 LAB — MAGNESIUM: Magnesium: 2.1 mg/dL (ref 1.7–2.4)

## 2023-04-23 MED ORDER — ENSURE ENLIVE PO LIQD
237.0000 mL | Freq: Two times a day (BID) | ORAL | Status: DC
Start: 2023-04-23 — End: 2023-04-23
  Administered 2023-04-23: 237 mL via ORAL

## 2023-04-23 MED ORDER — ATORVASTATIN CALCIUM 40 MG PO TABS
40.0000 mg | ORAL_TABLET | Freq: Every day | ORAL | 1 refills | Status: DC
  Filled 2023-04-23: qty 30, 30d supply, fill #0

## 2023-04-23 MED ORDER — ALBUTEROL SULFATE HFA 108 (90 BASE) MCG/ACT IN AERS: 2.0000 | INHALATION_SPRAY | Freq: Four times a day (QID) | RESPIRATORY_TRACT | Status: AC | PRN

## 2023-04-23 MED ORDER — ASPIRIN 325 MG PO TBEC
325.0000 mg | DELAYED_RELEASE_TABLET | Freq: Every day | ORAL | 1 refills | Status: DC
  Filled 2023-04-23: qty 30, 30d supply, fill #0

## 2023-04-23 MED ORDER — LACTATED RINGERS IV BOLUS
1000.0000 mL | Freq: Once | INTRAVENOUS | Status: AC
Start: 2023-04-23 — End: 2023-04-23
  Administered 2023-04-23: 1000 mL via INTRAVENOUS

## 2023-04-23 MED ORDER — HEPARIN SOD (PORK) LOCK FLUSH 100 UNIT/ML IV SOLN
500.0000 [IU] | INTRAVENOUS | Status: AC | PRN
  Administered 2023-04-23: 500 [IU]

## 2023-04-23 NOTE — Discharge Summary (Addendum)
 Physician Discharge Summary  [name]. KGM:[id number] DOB: 1955-06-27 DOA: 04/20/2023  PCP: Terry Gander, MD  Admit date: 04/20/2023 Discharge date: 04/23/2023  Time spent: 40 minutes  Recommendations for Outpatient Follow-up:  Follow outpatient CBC/CMP  Follow with neurology outpatient  Needs repeat MRI brain with and without contrast in 4-6 weeks   Follow lesion in L cerebellar flocculus  Follow cystic focus in R posterior neck  Tachycardia and DOE thought due to his malignancy and chronic lung disease - follow symptoms outpatient Follow white count outpatient - no signs of infection here Continue wound care outpatient  BP meds were on hold prior to admission - will continue to hold on discharge, resume per outpatient providers recs  Discharge Diagnoses:  Principal Problem:   CVA (cerebral vascular accident) [facility]) Active Problems:   Primary cancer of left upper lobe of lung ([name])   Essential hypertension   HLD (hyperlipidemia)   Wound eschar of foot   Stroke ([name])   AMS (altered mental status)   Pressure injury of skin   Discharge Condition: stable  Diet recommendation: heart healthy  Filed Weights   04/21/23 1429  Weight: 64.8 kg    History of present illness:   Terry Page. is Terry Page 68 y.o. 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  He presented for his usual chemotherapy today and was noted to have weakness for the last week as well as slurred speech.    MRI showed findings concerning for stroke.  He's been seen by neurology.  Needs repeat MRI in 4-6 weeks.  Stable for discharge 4/21.  See below for additional details.  Hospital Course:  Assessment and Plan:  Stroke Like Symptoms MRI w/wo showing new round nonenhancing 7 mm focus of mild DWI hyperintensity in the medial L cerebellum.  Indeterminate, but may represent recent or subacute  infarct.  Needs short interval follow up MRI with contrast in 4-6 weeks to ensure expected evolution. Carotid US  with 60-79% stenosis in R ICA, 40-59% stenosis in L ICA (ECA >50% stenosed) - bilateral vertebrals with antegrade flow, stenotic L subclavian artery  echo with EF 60-65%, no RWMA CTA with severe setnosis of L common carotid artery at it's origin (80% or greater).  Vessel is occluded 2 cm beyond that in the upper mediastinum.  No flow within L common carotid beyond that.  Dense calcified plaque at the L carotid bifurcation and ICA bulb.  Reconstituted flow at this level, probably from external to internal collateral vessels.  Beyon the atherosclerotic disease at the bifurcation, the ICA is patent to the skull base as Deneise Getty small vessel.  Advanced atherosclerotic disease at the right carotid bifurcation and ICA bulb.  Multiple serial stenoses.  In the distal bulb, minimal diameter is 1 mm or less.  Compared to Jalynne Persico more distal cervical ICA diameter of 5 mm, this indicates an 80% or greater stenosis.  Extensive atherosclerotic disease of the proximal R vertebral artery with multiple serial stenoses.  Greatest stenosis in the mid cervical region is probably Keishaun Hazel 70-80%.  Moderate stenosis of the proximal L subcalvian artery.  Advanced atherosclerotic disease of both carotid siphons with multiple stenoses on both sides.  (See report) PT/OT/SLP -> outpatient PT A1c 6, LDL 78 Continue aspirin , increase lipitor to 40 mg Appreciate neurology assistance - plan to continue aspirin  325 mg daily (holding off on plavix).  Repeat MRI brain w and w/o in 4-6 weeks to ensure expected evolution.    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  12 mm new lesion in region of L cerebellar flocculus Needs follow up in setting of his history malignancy  Sinus Tachycardia Supraventricular Tachycardia  No PE on CT PE protocol Echo with normal EF, normal RVSF TSH wnl EKG with  sinus tach, SVT noted on tele Not particularly symptomatic other than DOE (possibly related to the malignancy)  Suspect this is related to his pulmonary dz and doe  Dyspnea on Exertion He notes this is new starting about 1 week ago CT PE protocol with increased size of irregular consolidation in the L apex 2.6x2 cm (progression of post treatment change vs residual/recurrent malignancy).  New nodules in the LLL.   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  MRI without edema or enhancement to suggest metastasis, but short interval followup up MRI head with contrast in 4-6 weeks recommended given history. On Tylenol  and tramadol  every 4-6 hours for pain. Takes dex prior to chemo   Leukocytosis Unclear cause, trend   Hypokalemia Replace and follow   Anemia Trend    Bilateral Heel Wounds Cleanse bilateral heel wounds with saline, cover with single layer of xeroform, top with foam. Change every other day.  Prevalon boots bilaterally, offload at all times.   1.5 cm cystic focus in the right posterior neck Needs follow up CT or US       Procedures:  Echo IMPRESSIONS     1. Left ventricular ejection fraction, by estimation, is 60 to 65%. The  left ventricle has normal function. The left ventricle has no regional  wall motion abnormalities. Indeterminate diastolic filling due to E-Jamilah Jean  fusion.   2. Right ventricular systolic function is normal. The right ventricular  size is normal. Tricuspid regurgitation signal is inadequate for assessing  PA pressure.   3. The mitral  valve is grossly normal. No evidence of mitral valve  regurgitation. No evidence of mitral stenosis.   4. The aortic valve is tricuspid. Aortic valve regurgitation is not  visualized. No aortic stenosis is present.   5. Aortic dilatation noted. There is mild dilatation of the aortic root,  measuring 41 mm. There is mild dilatation of the ascending aorta,  measuring 41 mm.   6. The inferior vena cava is normal in size with greater than 50%  respiratory variability, suggesting right atrial pressure of 3 mmHg.   Conclusion(s)/Recommendation(s): No intracardiac source of embolism  detected on this transthoracic study. Consider Allayah Raineri transesophageal  echocardiogram to exclude cardiac source of embolism if clinically  indicated.   LE US  Summary:  RIGHT:      - There is no evidence of deep vein thrombosis in the lower extremity.    - No cystic structure found in the popliteal fossa.    LEFT:      - There is no evidence of deep vein thrombosis in the lower extremity.    - No cystic structure found in the popliteal fossa.  Summary:  Right Carotid: Velocities in the right ICA are consistent with Estill Llerena 60-79%                 stenosis.   Left Carotid: Velocities in the left ICA are consistent with Jaydalee Bardwell 40-59%  stenosis.               The ECA appears >50% stenosed.   Vertebrals:  Bilateral vertebral arteries demonstrate antegrade flow.  Left:              elevated velocities.  Subclavians: Left subclavian artery was stenotic. Normal flow hemodynamics  were              seen in the right subclavian artery.   Consultations: Oncology over phone neurology  Discharge Exam: Vitals:   04/23/23 0438 04/23/23 1055  BP: 115/84 101/77  Pulse: (!) 104 (!) 117  Resp: 20 18  Temp: 98.6 F (37 C) 97.8 F (36.6 C)  SpO2: 98% 100%    See progress note Discussed d/c plan with wife/patient  Discharge Instructions   Discharge Instructions     Ambulatory referral to Neurology    Complete by: As directed    An appointment is requested in approximately: 4 weeks   Ambulatory referral to Physical Therapy   Complete by: As directed    Call MD for:  difficulty breathing, headache or visual disturbances   Complete by: As directed    Call MD for:  extreme fatigue   Complete by: As directed    Call MD for:  hives   Complete by: As directed    Call MD for:  persistant dizziness or light-headedness   Complete by: As directed    Call MD for:  persistant nausea and vomiting   Complete by: As directed    Call MD for:  redness, tenderness, or signs of infection (pain, swelling, redness, odor or green/yellow discharge around incision site)   Complete by: As directed    Call MD for:  severe uncontrolled pain   Complete by: As directed    Call MD for:  temperature >100.4   Complete by: As directed    Diet - low sodium heart healthy   Complete by: As directed    Discharge instructions   Complete by: As directed    You were seen for symptoms concerning for Lamerle Jabs stroke.   Your MRI had findings concerning for Lashana Spang stroke, though it's not clear if this represents Falon Flinchum stroke or metastatic cancer.  You should get Seville Brick repeat MRI with your outpatient doctor in 4-6 weeks.  We've started you on aspirin .  We've increased your lipitor.  Your imaging shows significant atherosclerotic disease.  Follow with your PCP outpatient.    Your CT chest shows worsening lung findings possibly due to post treatment changes or residual or recurrent malignancy.  You had new nodules in the left lower lobe.  Please follow these findings with your oncologist outpatient.  I think this is probably the reason for your progressive shortness of breath.  I think this is also the reason for your fast heart rate with exertion, but it warrants continued monitoring.     You had elevated white blood cell count on the day of discharge.  Follow this with your PCP or oncologist outpatient.  Watch for signs of infection and return to  the hospital if you develop fever, chills or other concerning symptoms.  Continue to take care of your bilateral heel wounds.  You'll need aggressive  wound care outpatient.   Return for new, recurrent, or worsening symptoms.  Please ask your PCP to request records from this hospitalization so they know what was done and what the next steps will be.   Discharge wound care:   Complete by: As directed    Cleanse heel wounds with saline, pat dry. Cover each with single layer of xeroform, top with foam. Change every other day   For home use only DME 4 wheeled rolling walker with seat   Complete by: As directed    Patient needs Judd Mccubbin walker to treat with the following condition: Physical deconditioning   Increase activity slowly   Complete by: As directed    Clinic Appointment Request   Complete by: Apr 27, 2023    Contact your oncology clinic or infusion center to schedule this appointment.   Infusion Appointment Request   Complete by: Apr 27, 2023    Contact your oncology clinic or infusion center to schedule this appointment.   Lab Appointment Request   Complete by: Apr 27, 2023    Patient requires Keneshia Tena Lab or Flush Appointment?: Flush Appointment   Contact your oncology clinic or infusion center to schedule this appointment.      Allergies as of 04/23/2023       Reactions   Morphine  Itching   Oxycodone  Itching        Medication List     STOP taking these medications    amLODipine  10 MG tablet Commonly known as: NORVASC        TAKE these medications    acetaminophen  325 MG tablet Commonly known as: TYLENOL  Take 2 tablets (650 mg total) by mouth every 6 (six) hours as needed for mild pain or headache (fever >/= 101).   aspirin  325 MG tablet Take 1 tablet (325 mg total) by mouth daily. Start taking on: April 24, 2023   atorvastatin  40 MG tablet Commonly known as: LIPITOR Take 1 tablet (40 mg total) by mouth daily. Start taking on: April 24, 2023 What changed:   medication strength how much to take   cyanocobalamin  1000 MCG tablet Commonly known as: VITAMIN B12 Take 1 tablet (1,000 mcg total) by mouth daily. What changed:  when to take this reasons to take this   dexamethasone  4 MG tablet Commonly known as: DECADRON  Take 2 tabs by mouth 2 times daily starting day before chemo. Then take 2 tabs daily for 2 days starting day after chemo. Take with food.   dronabinol  5 MG capsule Commonly known as: MARINOL  Take 1 capsule (5 mg total) by mouth 2 (two) times daily before lunch and supper.   ferrous sulfate  325 (65 FE) MG EC tablet Take 1 tablet (325 mg total) by mouth daily with breakfast.   folic acid  1 MG tablet Commonly known as: FOLVITE  Take 1 tablet (1 mg total) by mouth daily.   hydrOXYzine  25 MG tablet Commonly known as: ATARAX  Take 1 tablet (25 mg total) by mouth at bedtime as needed for itching.   ipratropium-albuterol  0.5-2.5 (3) MG/3ML Soln Commonly known as: DUONEB Take 3 mLs by nebulization every 6 (six) hours as needed. What changed: reasons to take this   lidocaine  5 % Commonly known as: LIDODERM  Place 1 patch onto the skin daily. Remove & Discard patch within 12 hours or as directed by MD. Apply to low back   lidocaine -prilocaine  cream Commonly known as: EMLA  Apply 1 Application topically as needed.   olmesartan 40 MG tablet Commonly known as: BENICAR Take  40 mg by mouth daily.   ondansetron  8 MG tablet Commonly known as: Zofran  Take 1 tablet (8 mg total) by mouth every 8 (eight) hours as needed for nausea or vomiting.   polyethylene glycol 17 g packet Commonly known as: MiraLax  Take 17 g by mouth daily.   potassium chloride  SA 20 MEQ tablet Commonly known as: KLOR-CON  M TAKE 1 TABLET(20 MEQ) BY MOUTH DAILY   prochlorperazine  10 MG tablet Commonly known as: COMPAZINE  Take 1 tablet (10 mg total) by mouth every 6 (six) hours as needed for nausea or vomiting.   senna-docusate 8.6-50 MG tablet Commonly  known as: Stimulant Laxative Take 2 tablets by mouth 2 (two) times daily. May add additional tablet as needed   Senna S 8.6-50 MG tablet Generic drug: senna-docusate Take 2 tablets by mouth at bedtime as needed for mild constipation or moderate constipation.   sodium chloride  1 g tablet Take 1 tablet (1 g total) by mouth 3 (three) times daily with meals.   thiamine  100 MG tablet Commonly known as: VITAMIN B1 Take 1 tablet (100 mg total) by mouth daily.   traMADol  50 MG tablet Commonly known as: ULTRAM  Take 1-2 tablets (50-100 mg total) by mouth every 6 (six) hours as needed.               Durable Medical Equipment  (From admission, onward)           Start     Ordered   04/23/23 0000  For home use only DME 4 wheeled rolling walker with seat       Question:  Patient needs Derrich Gaby walker to treat with the following condition  Answer:  Physical deconditioning   04/23/23 1439              Discharge Care Instructions  (From admission, onward)           Start     Ordered   04/23/23 0000  Discharge wound care:       Comments: Cleanse heel wounds with saline, pat dry. Cover each with single layer of xeroform, top with foam. Change every other day   04/23/23 1439           Allergies  Allergen Reactions   Morphine  Itching   Oxycodone  Itching      The results of significant diagnostics from this hospitalization (including imaging, microbiology, ancillary and laboratory) are listed below for reference.    Significant Diagnostic Studies: VAS US  LOWER EXTREMITY VENOUS (DVT) Result Date: 04/22/2023  Lower Venous DVT Study Patient Name:  Terry Page.  Date of Exam:   04/21/2023 Medical Rec #: [id number]             Accession #:    [id number] Date of Birth: 1955-12-24              Patient Gender: M Patient Age:   40 years Exam Location:  [facility] Procedure:      VAS US  LOWER EXTREMITY VENOUS (DVT) Referring Phys: Baldwin Levee  --------------------------------------------------------------------------------  Indications: Stroke.  Risk Factors: Cancer Stage IV lung and prostate cancer with mets to bones, on chemo. Limitations: Edema and Shadowing from arterial calcification. Comparison Study: No prior study on file. Performing Technologist: Ria Chad  Examination Guidelines: Iyanna Drummer complete evaluation includes B-mode imaging, spectral Doppler, color Doppler, and power Doppler as needed of all accessible portions of each vessel. Bilateral testing is considered an integral part of Cosette Prindle complete examination. Limited examinations for  reoccurring indications may be performed as noted. The reflux portion of the exam is performed with the patient in reverse Trendelenburg.  +---------+---------------+---------+-----------+----------+--------------+ RIGHT    CompressibilityPhasicitySpontaneityPropertiesThrombus Aging +---------+---------------+---------+-----------+----------+--------------+ CFV      Full           Yes      Yes                                 +---------+---------------+---------+-----------+----------+--------------+ SFJ      Full           Yes      Yes                                 +---------+---------------+---------+-----------+----------+--------------+ FV Prox  Full                                                        +---------+---------------+---------+-----------+----------+--------------+ FV Mid   Full                                                        +---------+---------------+---------+-----------+----------+--------------+ FV DistalFull                                                        +---------+---------------+---------+-----------+----------+--------------+ PFV      Full                                                        +---------+---------------+---------+-----------+----------+--------------+ POP      Full           Yes      Yes                                  +---------+---------------+---------+-----------+----------+--------------+ PTV      Full                                                        +---------+---------------+---------+-----------+----------+--------------+ PERO     Full                                                        +---------+---------------+---------+-----------+----------+--------------+   +---------+---------------+---------+-----------+----------+--------------+ LEFT     CompressibilityPhasicitySpontaneityPropertiesThrombus Aging +---------+---------------+---------+-----------+----------+--------------+ CFV      Full           Yes  Yes                                 +---------+---------------+---------+-----------+----------+--------------+ SFJ      Full           Yes      Yes                                 +---------+---------------+---------+-----------+----------+--------------+ FV Prox  Full                                                        +---------+---------------+---------+-----------+----------+--------------+ FV Mid   Full                                                        +---------+---------------+---------+-----------+----------+--------------+ FV DistalFull                                                        +---------+---------------+---------+-----------+----------+--------------+ PFV      Full                                                        +---------+---------------+---------+-----------+----------+--------------+ POP      Full           Yes      Yes                                 +---------+---------------+---------+-----------+----------+--------------+ PTV      Full                                                        +---------+---------------+---------+-----------+----------+--------------+ PERO     Full                                                         +---------+---------------+---------+-----------+----------+--------------+     Summary: RIGHT: - There is no evidence of deep vein thrombosis in the lower extremity.  - No cystic structure found in the popliteal fossa.  LEFT: - There is no evidence of deep vein thrombosis in the lower extremity.  - No cystic structure found in the popliteal fossa.  *See table(s) above for measurements and observations. Electronically signed by Runell Countryman on 04/22/2023 at 3:52:32 PM.    Final    VAS US  CAROTID (at [facility]  and [facility] only) Result Date: 04/22/2023 Carotid Arterial Duplex Study Patient Name:  Terry Page.  Date of Exam:   04/21/2023 Medical Rec #: [id number]             Accession #:    [id number] Date of Birth: [birth year]              Patient Gender: M Patient Age:   4 years Exam Location:  [facility] Procedure:      VAS US  CAROTID Referring Phys: Bertell Broach PRATT --------------------------------------------------------------------------------  Indications:       CVA, Carotid artery disease, Speech disturbance and Weakness. Risk Factors:      Hypertension, current smoker. Other Factors:     Stage IV lung and prostate cancer with mets to bones, on                    chemo.                    CT noted approximately 70-80% stenosis in bilateral carotid                    arteries. Comparison Study:  No prior exam. on file. Performing Technologist: Ria Chad  Examination Guidelines: Nika Yazzie complete evaluation includes B-mode imaging, spectral Doppler, color Doppler, and power Doppler as needed of all accessible portions of each vessel. Bilateral testing is considered an integral part of Annslee Tercero complete examination. Limited examinations for reoccurring indications may be performed as noted.  Right Carotid Findings: +----------+--------+--------+--------+-------------------------+--------+           PSV cm/sEDV cm/sStenosisPlaque Description       Comments  +----------+--------+--------+--------+-------------------------+--------+ CCA Prox  78      23                                                +----------+--------+--------+--------+-------------------------+--------+ CCA Mid   60      13              hyperechoic and calcific          +----------+--------+--------+--------+-------------------------+--------+ CCA Distal62      24                                                +----------+--------+--------+--------+-------------------------+--------+ ICA Prox  92      41              heterogenous and calcific         +----------+--------+--------+--------+-------------------------+--------+ ICA Mid   188     82      60-79%  calcific                          +----------+--------+--------+--------+-------------------------+--------+ ICA Distal100     38                                                +----------+--------+--------+--------+-------------------------+--------+ ECA       73      15  hyperechoic and calcific          +----------+--------+--------+--------+-------------------------+--------+ +----------+--------+-------+----------------+-------------------+           PSV cm/sEDV cmsDescribe        Arm Pressure (mmHG) +----------+--------+-------+----------------+-------------------+ Subclavian122     19     Diffused plaque.                    +----------+--------+-------+----------------+-------------------+ +---------+--------+---+--------+--+ VertebralPSV cm/s120EDV cm/s36 +---------+--------+---+--------+--+  Left Carotid Findings: +----------+--------+--------+--------+-------------------------+--------+           PSV cm/sEDV cm/sStenosisPlaque Description       Comments +----------+--------+--------+--------+-------------------------+--------+ CCA Prox                  Occluded                                   +----------+--------+--------+--------+-------------------------+--------+ CCA Mid                   Occluded                                  +----------+--------+--------+--------+-------------------------+--------+ CCA [id number]             >50%                                      +----------+--------+--------+--------+-------------------------+--------+ ICA Prox  109     47      40-59%  heterogenous and calcific         +----------+--------+--------+--------+-------------------------+--------+ ICA Mid   47      28                                                +----------+--------+--------+--------+-------------------------+--------+ ICA Distal37      22                                                +----------+--------+--------+--------+-------------------------+--------+ ECA       216     102     >50%    heterogenous and calcific         +----------+--------+--------+--------+-------------------------+--------+ +----------+--------+--------+----------------+-------------------+           PSV cm/sEDV cm/sDescribe        Arm Pressure (mmHG) +----------+--------+--------+----------------+-------------------+ [id number]     22      Diffused plaque.                    +----------+--------+--------+----------------+-------------------+ +---------+--------+---+--------+--+------------------+ VertebralPSV cm/s125EDV cm/s48Stenotic at origin +---------+--------+---+--------+--+------------------+   Summary: Right Carotid: Velocities in the right ICA are consistent with Aliyana Dlugosz 60-79%                stenosis. Left Carotid: Velocities in the left ICA are consistent with Sundra Haddix 40-59% stenosis.               The ECA appears >50% stenosed. Vertebrals:  Bilateral vertebral arteries demonstrate antegrade flow. Left:              elevated velocities. Subclavians: Left subclavian artery was stenotic. Normal  flow hemodynamics were              seen in the right subclavian  artery. *See table(s) above for measurements and observations.  Suggest Peripheral Vascular Consult. Electronically signed by Ardella Beaver MD on 04/22/2023 at 10:50:30 AM.    Final    CT ANGIO HEAD NECK W WO CM Result Date: 04/21/2023 CLINICAL DATA:  Mental status change of unknown cause. Abnormal cerebellar finding on MRI yesterday. EXAM: CT ANGIOGRAPHY HEAD AND NECK WITH AND WITHOUT CONTRAST TECHNIQUE: Multidetector CT imaging of the head and neck was performed using the standard protocol during bolus administration of intravenous contrast. Multiplanar CT image reconstructions and MIPs were obtained to evaluate the vascular anatomy. Carotid stenosis measurements (when applicable) are obtained utilizing NASCET criteria, using the distal internal carotid diameter as the denominator. RADIATION DOSE REDUCTION: This exam was performed according to the departmental dose-optimization program which includes automated exposure control, adjustment of the mA and/or kV according to patient size and/or use of iterative reconstruction technique. CONTRAST:  75mL OMNIPAQUE  IOHEXOL  350 MG/ML SOLN COMPARISON:  MRI 04/20/2023. Neck CT 04/20/2023. Head CT 04/20/2023. Brain MRI 05/05/2021. FINDINGS: CT HEAD FINDINGS Brain: Brain detail is limited by some technical deficiency. Previously described peripheral left cerebellar lesion is not visible on this exam. No sign of acute stroke, mass, hemorrhage, hydrocephalus or extra-axial collection. 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 Vascular: No abnormal vascular finding. 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 Sinuses/Orbits: Clear/normal Other: None Review of the MIP images confirms the above findings CTA NECK FINDINGS Aortic arch: Aortic atherosclerosis.  Branching pattern is normal. Right carotid system: Common carotid artery shows Annella Prowell 30% stenosis at its  origin from the innominate artery. Beyond that, there is scattered plaque but the vessel is patent to the bifurcation. Dense calcified plaque at the carotid bifurcation and ICA bulb. Multiple serial stenoses. In the distal bulb, minimal diameter is 1 mm or less. Compared to Divonte Senger more distal cervical ICA diameter of 5 mm, this indicates an 80% or greater stenosis. Left carotid system: Common carotid artery shows severe stenosis at its origin, 80% or greater. Vessel is occluded 2 cm beyond that in the upper mediastinum. No flow within the left common carotid artery beyond that. Dense calcified plaque at the carotid bifurcation and ICA bulb. There is reconstituted flow at this level, probably from external to internal collateral vessels. Beyond the atherosclerotic disease at the bifurcation, the ICA is patent 2 in through the skull base as Saxon Barich small vessel, diameter 2.8 mm. Vertebral arteries: No flow limiting proximal subclavian stenosis on the right. Calcified plaque at the right vertebral artery origin and affecting the proximal vertebral artery extensively with multiple serial stenoses. Greatest stenosis in the mid cervical region is probably Omaree Fuqua 70-80%. The vessel does show flow to the skull base and foramen magnum level. On the left, there is moderate stenosis of the proximal subclavian artery, estimated at 50%. The left vertebral artery origin is widely patent and the vessel shows some scattered plaque but is patent through the cervical region without stenosis greater than 30%. Skeleton: Ordinary cervical spondylosis. Other neck: No mass or lymphadenopathy. Upper chest: Advanced emphysema and pulmonary scarring. Spiculated lesion in the left upper lobe as evaluated on chest CT yesterday. Please see results of prior chest CT exam. Review of the MIP images confirms the above findings CTA HEAD FINDINGS  Anterior circulation: Both internal carotid arteries are patent through the skull base. There is advanced siphon  atherosclerotic disease with multiple stenoses on both sides. Serial stenoses are estimated in the range of 50-70%. The anterior and middle cerebral vessels show flow. No large vessel occlusion. Distal vessels show some atherosclerotic irregularity. Posterior circulation: Both vertebral arteries are patent through the foramen magnum to the basilar artery. Venous sinuses: Patent and normal. Anatomic variants: None significant. Review of the MIP images confirms the above findings IMPRESSION: 1. No acute head CT finding. Previously described peripheral left cerebellar lesion is not visible on this exam due to technical deficiency. 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 2. 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 3. Aortic atherosclerosis. 4. 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 Vessel is occluded 2 cm beyond that in the upper mediastinum. No flow within the left common carotid artery beyond that. Dense calcified plaque at the left carotid bifurcation and ICA bulb. Reconstituted flow at this level, probably from external to internal collateral vessels. Beyond the atherosclerotic disease at the bifurcation, the ICA is patent to the skull base as Arcelia Pals small vessel, diameter 2.8 mm. 5. Advanced atherosclerotic disease at the right carotid bifurcation and ICA bulb. Multiple serial stenoses. In the distal bulb, minimal diameter is 1 mm or less. Compared to Jadarious Dobbins more distal cervical ICA diameter of 5 mm, this indicates an 80% or greater stenosis. 6. Extensive atherosclerotic disease of the proximal right vertebral artery with multiple serial stenoses. Greatest stenosis in the mid cervical region is probably Rivkah Wolz 70-80%. The vessel does show flow to the skull base and foramen magnum level. 7. 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 8.  Advanced atherosclerotic disease of both carotid siphons with multiple stenoses on both sides. Serial stenoses are estimated in the range of 50-70%. 9. No acute intracranial large or medium vessel occlusion. The left cerebellar lesion remains indeterminate at this time for Naiah Donahoe late subacute stroke versus an atypical metastasis. Additionally on the MRI, there is an up to 12 mm newly seen lesion in the region of the left cerebellar flocculus that was not there in May of 2023. Although this is nonenhancing, it is viewed with some suspicion. It could be Samaya Boardley newly developed cyst, though I do not know why that would happen. Therefore, this should also be observed for the possibility of Summer Mccolgan metastasis. Aortic Atherosclerosis (ICD10-I70.0) and Emphysema (ICD10-J43.9). Electronically Signed   By: Bettylou Brunner M.D.   On: 04/21/2023 13:10   ECHOCARDIOGRAM COMPLETE Result Date: 04/21/2023    ECHOCARDIOGRAM REPORT   Patient Name:   Terry Page. Date of Exam: 04/21/2023 Medical Rec #:  [id number]            Height:       69.0 in Accession #:    [id number]           Weight:       142.9 lb Date of Birth:  1955/03/18             BSA:          1.791 m Patient Age:    67 years             BP:           149/98 mmHg Patient Gender: M  HR:           102 bpm. Exam Location:  Inpatient Procedure: 2D Echo, Cardiac Doppler, Color Doppler and Intracardiac            Opacification Agent (Both Spectral and Color Flow Doppler were            utilized during procedure). Indications:    Stroke  History:        Patient has prior history of Echocardiogram examinations. Risk                 Factors:Former Smoker and Hypertension.  Sonographer:    Willey Harrier Referring Phys: [id number] Maisie Scotland PRATT  Sonographer Comments: Suboptimal parasternal window, suboptimal apical window and Technically difficult study due to poor echo windows. Image acquisition challenging due to respiratory motion. IMPRESSIONS  1. Left ventricular ejection  fraction, by estimation, is 60 to 65%. The left ventricle has normal function. The left ventricle has no regional wall motion abnormalities. Indeterminate diastolic filling due to E-Wilber Fini fusion.  2. Right ventricular systolic function is normal. The right ventricular size is normal. Tricuspid regurgitation signal is inadequate for assessing PA pressure.  3. The mitral valve is grossly normal. No evidence of mitral valve regurgitation. No evidence of mitral stenosis.  4. The aortic valve is tricuspid. Aortic valve regurgitation is not visualized. No aortic stenosis is present.  5. Aortic dilatation noted. There is mild dilatation of the aortic root, measuring 41 mm. There is mild dilatation of the ascending aorta, measuring 41 mm.  6. The inferior vena cava is normal in size with greater than 50% respiratory variability, suggesting right atrial pressure of 3 mmHg. Conclusion(s)/Recommendation(s): No intracardiac source of embolism detected on this transthoracic study. Consider Kraven Calk transesophageal echocardiogram to exclude cardiac source of embolism if clinically indicated. FINDINGS  Left Ventricle: Left ventricular ejection fraction, by estimation, is 60 to 65%. The left ventricle has normal function. The left ventricle has no regional wall motion abnormalities. The left ventricular internal cavity size was normal in size. There is  no left ventricular hypertrophy. Indeterminate diastolic filling due to E-Carissa Musick fusion. Right Ventricle: The right ventricular size is normal. No increase in right ventricular wall thickness. Right ventricular systolic function is normal. Tricuspid regurgitation signal is inadequate for assessing PA pressure. Left Atrium: Left atrial size was normal in size. Right Atrium: Right atrial size was not well visualized. Pericardium: There is no evidence of pericardial effusion. Presence of epicardial fat layer. Mitral Valve: The mitral valve is grossly normal. No evidence of mitral valve regurgitation.  No evidence of mitral valve stenosis. MV peak gradient, 3.3 mmHg. The mean mitral valve gradient is 1.0 mmHg. Tricuspid Valve: The tricuspid valve is grossly normal. Tricuspid valve regurgitation is trivial. No evidence of tricuspid stenosis. Aortic Valve: The aortic valve is tricuspid. Aortic valve regurgitation is not visualized. No aortic stenosis is present. Aortic valve peak gradient measures 3.6 mmHg. Pulmonic Valve: The pulmonic valve was grossly normal. Pulmonic valve regurgitation is not visualized. No evidence of pulmonic stenosis. Aorta: Aortic dilatation noted. There is mild dilatation of the aortic root, measuring 41 mm. There is mild dilatation of the ascending aorta, measuring 41 mm. Venous: The inferior vena cava is normal in size with greater than 50% respiratory variability, suggesting right atrial pressure of 3 mmHg. IAS/Shunts: The interatrial septum was not well visualized.  LEFT VENTRICLE PLAX 2D LVIDd:         4.00 cm   Diastology LVIDs:  2.80 cm   LV e' medial:    5.77 cm/s LV PW:         1.10 cm   LV E/e' medial:  9.4 LV IVS:        1.00 cm   LV e' lateral:   7.07 cm/s LVOT diam:     2.10 cm   LV E/e' lateral: 7.7 LV SV:         49 LV SV Index:   27 LVOT Area:     3.46 cm  IVC IVC diam: 1.50 cm AORTIC VALVE AV Area (Vmax): 2.99 cm AV Vmax:        94.60 cm/s AV Peak Grad:   3.6 mmHg LVOT Vmax:      81.60 cm/s LVOT Vmean:     58.300 cm/s LVOT VTI:       0.141 m  AORTA Ao Root diam: 4.10 cm Ao Asc diam:  4.10 cm MITRAL VALVE MV Area (PHT): 4.71 cm    SHUNTS MV Area VTI:   3.90 cm    Systemic VTI:  0.14 m MV Peak grad:  3.3 mmHg    Systemic Diam: 2.10 cm MV Mean grad:  1.0 mmHg MV Vmax:       0.90 m/s MV Vmean:      57.3 cm/s MV Decel Time: 161 msec MV E velocity: 54.40 cm/s MV Aryah Doering velocity: 90.40 cm/s MV E/Isaic Syler ratio:  0.60 Jackquelyn Mass MD Electronically signed by Jackquelyn Mass MD Signature Date/Time: 04/21/2023/12:32:53 PM    Final    MR Brain W and Wo Contrast Result Date:  04/20/2023 CLINICAL DATA:  Mental status change, unknown cause EXAM: MRI HEAD WITHOUT AND WITH CONTRAST TECHNIQUE: Multiplanar, multiecho pulse sequences of the brain and surrounding structures were obtained without and with intravenous contrast. CONTRAST:  6mL GADAVIST  GADOBUTROL  1 MMOL/ML IV SOLN COMPARISON:  Same day CT head.  MRI head May 4, 23. FINDINGS: Brain: Round, nonenhancing 7 mm focus of mild DWI hyperintensity in the medial left cerebellum (series 5, image 6 and series 8, image 4), which is new from prior. No substantial mass effect. No pathologic enhancement. No definite acute hemorrhage or hydrocephalus. Vascular: Major arterial flow voids are maintained skull base. Skull and upper cervical spine: Normal marrow signal. Sinuses/Orbits: Clear sinuses.  No acute orbital findings. IMPRESSION: Round, nonenhancing 7 mm focus of mild DWI hyperintensity in the medial left cerebellum, which is new from prior. This finding is indeterminate but may represent Monserrate Blaschke recent or subacute infarct. No associated edema or enhancement to suggest metastasis; however, given the clinical history, recommend short interval follow-up MRI head with contrast in 4-6 weeks to ensure expected evolution. Electronically Signed   By: Stevenson Elbe M.D.   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 History of COPD. Generalized weakness and slurred speech. Recent change to voice. EXAM: CT ANGIOGRAPHY CHEST WITH CONTRAST TECHNIQUE: Multidetector CT imaging of the chest was performed using the standard protocol during bolus administration of intravenous contrast. Multiplanar CT image reconstructions and MIPs were obtained to evaluate the vascular anatomy. RADIATION DOSE REDUCTION: This exam was performed according to the departmental dose-optimization program which includes automated exposure control, adjustment of the  mA and/or kV according to patient size and/or use of iterative reconstruction technique. CONTRAST:  75mL OMNIPAQUE  IOHEXOL  350 MG/ML SOLN COMPARISON:  Same day chest radiograph and CT 02/15/2023 FINDINGS: Cardiovascular:  No pericardial effusion. Negative for acute pulmonary embolism. Aortic and coronary artery atherosclerotic calcification. Right chest wall [place] Zenon-Cath. Mediastinum/Nodes: Trachea and esophagus are unremarkable. Similar soft tissue thickening about the inferior left hilum compared to prior and likely representing post treatment change. Lungs/Pleura: Advanced paraseptal and centrilobular emphysema with bullous change in the left apex. Diffuse bronchial wall thickening. No pleural effusion or pneumothorax. Increased size of the irregular consolidation in the left apex now measuring 2.6 x 2.0 cm. This measured 2.4 x 1.3 cm on CT 02/15/2023 using similar measuring technique. This may represent progression of post treatment change however residual or recurrent malignancy is not excluded. Multiple pulmonary nodules are redemonstrated and are similar in size to 02/15/2023. Interval increase in size of Temeca Somma subpleural anterior right upper lobe nodule measuring 6 mm nodule (13/108) previously measured 4 mm. Multiple new nodules in the left lower lobe measuring up to 6 mm (series 13/image 288). Upper Abdomen: Stable hypoattenuating lesions in the liver likely representing benign cyst. No acute abnormality in the visualized abdomen. Musculoskeletal: No acute fracture. Similar mixed lytic and sclerotic appearance of the left scapula. Review of the MIP images confirms the above findings. IMPRESSION: 1. Negative for acute pulmonary embolism. 2. Increased size of the irregular consolidation in the left apex now measuring 2.6 x 2.0 cm. This measured 2.4 x 1.3 cm on CT 02/15/2023. This may represent progression of post treatment change however residual or recurrent malignancy is not excluded. Recommend close interval  follow-up or PET/CT. 3. Multiple new nodules in the left lower lobe measuring up to 6 mm. Interval increase in size of Emiel Kielty subpleural anterior right upper lobe nodule measuring 6 mm previously measured 4 mm. Aortic Atherosclerosis (ICD10-I70.0) and Emphysema (ICD10-J43.9). Electronically Signed   By: Rozell Cornet M.D.   On: 04/20/2023 19:41   CT Soft Tissue Neck W Contrast Result Date: 04/20/2023 CLINICAL DATA:  Soft tissue infection suspected, generalized weakness and speech difficulty. History of metastatic prostate/lung cancer. EXAM: CT NECK WITH CONTRAST TECHNIQUE: Multidetector CT imaging of the neck was performed using the standard protocol following the bolus administration of intravenous contrast. RADIATION DOSE REDUCTION: This exam was performed according to the departmental dose-optimization program which includes automated exposure control, adjustment of the mA and/or kV according to patient size and/or use of iterative reconstruction technique. CONTRAST:  75mL OMNIPAQUE  IOHEXOL  350 MG/ML SOLN COMPARISON:  None Available. FINDINGS: Pharynx and larynx: Symmetric appearance of the nasopharynx. The palatine tonsils are relatively symmetric. Normal appearance of the oral cavity and floor of mouth. The base of tongue is unremarkable. The epiglottis is normal. No retropharyngeal effusion. There is no evidence of fluid collection or peritonsillar abscess. Symmetric appearance of the aryepiglottic folds and piriform sinuses. Normal appearance of the paraglottic fat. The vocal folds are symmetric. Salivary glands: There is slightly asymmetric hyperattenuation of the right parotid gland which may be artifactual. The parotid glands are relatively symmetric in size. There is no inflammatory stranding adjacent to the right parotid gland. No ductal enlargement or evidence of abnormal calcification. The submandibular glands are symmetric. Thyroid : Normal. Lymph nodes: There are no enlarged lymph nodes in the neck.  There is Yamil Dougher 1.5 x 0.9 x 0.8 cm cystic appearing focus within the right posterior neck involving cervical level 2 B/5 which demonstrates internal fluid density. No definite associated soft tissue component. Vascular: Moderate atherosclerosis of the visualized aortic arch. There is prominent atherosclerosis involving the origin of the left subclavian artery resulting in at least moderate stenosis. Additional prominent atherosclerosis  of distal left subclavian/proximal left axillary artery. Prominent calcified atherosclerosis involving the bilateral carotid bifurcations. There is likely severe stenosis at the origins of both internal carotid arteries. Additional atherosclerosis of the partially visualized carotid siphons. Right IJ approach central venous catheter noted. Limited intracranial: Negative. Visualized orbits: Negative. Mastoids and visualized paranasal sinuses: Mastoid air cells are clear. Mucosal thickening in the alveolar recesses of the maxillary sinuses. Skeleton: No acute or aggressive finding noted. Degenerative changes throughout the cervical spine. Upper chest: Emphysema. Scarring in the bilateral lung apices and in the left upper lobe. See separately dictated CTA chest for complete evaluation of intrathoracic findings. Other: None. IMPRESSION: No acute abnormality along the aero digestive structures in the neck. No evidence of abscess. Slightly asymmetric attenuation of the right parotid gland which may be artifactual. No adjacent inflammatory stranding. Recommend correlation with tenderness over the right parotid. 1.5 cm cystic focus in the right posterior neck may reflect [place] lymphangioma/lymphatic malformation. There is no soft tissue component to suggest necrotic lymph node. Recommend short interval follow-up with CT or ultrasound of the neck. Atherosclerosis as above. 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 Consider CTA for further evaluation if clinically indicated.  Electronically Signed   By: Denny Flack M.D.   On: 04/20/2023 18:26   DG Chest 2 View Result Date: 04/20/2023 CLINICAL DATA:  Cough and shortness of breath EXAM: CHEST - 2 VIEW COMPARISON:  X-ray 04/08/2023 and older FINDINGS: Stable right IJ chest port with tip along the central SVC. Hyperinflation with chronic lung changes. No pneumothorax, effusion or edema. Normal cardiopericardial silhouette. Calcified aorta. Overlapping cardiac leads. Apical pleural thickening. Degenerative changes along the spine. IMPRESSION: Hyperinflation with chronic lung changes. Calcified aorta. Right IJ chest port. Electronically Signed   By: Adrianna Horde M.D.   On: 04/20/2023 12:43   CT HEAD WO CONTRAST Result Date: 04/20/2023 CLINICAL DATA:  Provided history: Mental status change, unknown cause. Additional history provided: Stroke-like symptoms. History of lung adenocarcinoma and prostate cancer. EXAM: CT HEAD WITHOUT CONTRAST TECHNIQUE: Contiguous axial images were obtained from the base of the skull through the vertex without intravenous contrast. RADIATION DOSE REDUCTION: This exam was performed according to the departmental dose-optimization program which includes automated exposure control, adjustment of the mA and/or kV according to patient size and/or use of iterative reconstruction technique. COMPARISON:  Brain MRI 05/05/2021. FINDINGS: Brain: Generalized cerebral atrophy. 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 Patchy and ill-defined hypoattenuation within the cerebral white matter, nonspecific but compatible with mild chronic small vessel ischemic disease. There is no acute intracranial hemorrhage. No demarcated cortical infarct. No extra-axial fluid collection. No evidence of an intracranial mass. No midline shift. Vascular: No hyperdense vessel.  Atherosclerotic calcifications. Skull: No calvarial fracture. 9 x 3 mm osseous protrusion projecting outward from the right  frontal calvarium most consistent with an osteoma. 12 mm lucent focus within the left parietal calvarium, extending to the inner table (series 4, image 32) (series 6, image 45). Sinuses/Orbits: No mass or acute finding within the imaged orbits. 6 mm left frontoethmoidal osteoma. Trace mucosal thickening scattered within the paranasal sinuses at the imaged levels. IMPRESSION: 1. Please note, assessment for intracranial metastatic disease is limited on this non-contrast head CT. 2. No evidence of an acute intracranial abnormality. 3. 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 4. Mild cerebral white matter chronic small vessel ischemic disease. 5. Generalized cerebral atrophy. 6. Nonspecific 12 mm lucent focus  within the left parietal calvarium. An osseous metastasis cannot be excluded. Consider Marjani Kobel brain MRI (with and without contrast) for further evaluation. Electronically Signed   By: Bascom Lily D.O.   On: 04/20/2023 11:57   DG Chest Portable 1 View Result Date: 04/08/2023 CLINICAL DATA:  Altered mental status.  Shortness of breath. EXAM: PORTABLE CHEST 1 VIEW COMPARISON:  Radiograph 03/22/2023, CT 02/15/2023 FINDINGS: Right chest port remains in place. Advanced emphysema. Stable radiographic appearance of left upper post treatment related change. No evidence of acute airspace disease. No pneumothorax or large pleural effusion. No pulmonary edema. Stable heart size and mediastinal contours. Right costophrenic angle is excluded from the field of view. IMPRESSION: 1. Advanced emphysema. No acute findings. 2. Stable post treatment related change in the left upper lobe. Electronically Signed   By: Chadwick Colonel M.D.   On: 04/08/2023 21:48    Microbiology: Recent Results (from the past 240 hours)  Resp panel by RT-PCR (RSV, Flu Raneen Jaffer&B, Covid) Anterior Nasal Swab     Status: None   Collection Time: 04/20/23 11:58 AM   Specimen: Anterior Nasal Swab  Result Value Ref Range Status   SARS  Coronavirus 2 by RT PCR NEGATIVE NEGATIVE Final    Comment: (NOTE) SARS-CoV-2 target nucleic acids are NOT DETECTED.  The SARS-CoV-2 RNA is generally detectable in upper respiratory specimens during the acute phase of infection. The lowest concentration of SARS-CoV-2 viral copies this assay can detect is 138 copies/mL. Tashi Andujo negative result does not preclude SARS-Cov-2 infection and should not be used as the sole basis for treatment or other patient management decisions. Halaina Vanduzer negative result may occur with  improper specimen collection/handling, submission of specimen other than nasopharyngeal swab, presence of viral mutation(s) within the areas targeted by this assay, and inadequate number of viral copies(<138 copies/mL). Kerolos Nehme negative result must be combined with clinical observations, patient history, and epidemiological information. The expected result is Negative.  Fact Sheet for Patients:  BloggerCourse.com  Fact Sheet for Healthcare Providers:  SeriousBroker.it  This test is no t yet approved or cleared by the [place]  FDA and  has been authorized for detection and/or diagnosis of SARS-CoV-2 by FDA under an Emergency Use Authorization (EUA). This EUA will remain  in effect (meaning this test can be used) for the duration of the COVID-19 declaration under Section 564(b)(1) of the Act, 21 U.S.C.section 360bbb-3(b)(1), unless the authorization is terminated  or revoked sooner.       Influenza Skarlet Lyons by PCR NEGATIVE NEGATIVE Final   Influenza B by PCR NEGATIVE NEGATIVE Final    Comment: (NOTE) The Xpert Xpress SARS-CoV-2/FLU/RSV plus assay is intended as an aid in the diagnosis of influenza from Nasopharyngeal swab specimens and should not be used as Jourdan Maldonado sole basis for treatment. Nasal washings and aspirates are unacceptable for Xpert Xpress SARS-CoV-2/FLU/RSV testing.  Fact Sheet for  Patients: BloggerCourse.com  Fact Sheet for Healthcare Providers: SeriousBroker.it  This test is not yet approved or cleared by the [place]  FDA and has been authorized for detection and/or diagnosis of SARS-CoV-2 by FDA under an Emergency Use Authorization (EUA). This EUA will remain in effect (meaning this test can be used) for the duration of the COVID-19 declaration under Section 564(b)(1) of the Act, 21 U.S.C. section 360bbb-3(b)(1), unless the authorization is terminated or revoked.     Resp Syncytial Virus by PCR NEGATIVE NEGATIVE Final    Comment: (NOTE) Fact Sheet for Patients: BloggerCourse.com  Fact Sheet for Healthcare Providers: SeriousBroker.it  This test  is not yet approved or cleared by the [place]  FDA and has been authorized for detection and/or diagnosis of SARS-CoV-2 by FDA under an Emergency Use Authorization (EUA). This EUA will remain in effect (meaning this test can be used) for the duration of the COVID-19 declaration under Section 564(b)(1) of the Act, 21 U.S.C. section 360bbb-3(b)(1), unless the authorization is terminated or revoked.  Performed at [facility], [address] [place], [place] [address]   Blood culture (routine x 2)     Status: None (Preliminary result)   Collection Time: 04/20/23 12:28 PM   Specimen: Left Antecubital; Blood  Result Value Ref Range Status   Specimen Description   Final    LEFT ANTECUBITAL Performed at [facility], [address], [place], [place] [address]    Special Requests   Final    BOTTLES DRAWN AEROBIC AND ANAEROBIC Blood Culture adequate volume Performed at [facility], [address], [place], [place] [address]    Culture   Final    NO GROWTH 3 DAYS Performed at [facility], [address] [place], [place] [address]    Report  Status PENDING  Incomplete  Blood culture (routine x 2)     Status: None (Preliminary result)   Collection Time: 04/20/23 12:28 PM   Specimen: Porta Cath; Blood  Result Value Ref Range Status   Specimen Description   Final    PORTA CATH Performed at [facility], [address], [place], [place] [address]    Special Requests   Final    BOTTLES DRAWN AEROBIC AND ANAEROBIC Blood Culture adequate volume Performed at [facility], [address] [place], [place] [address]    Culture   Final    NO GROWTH 3 DAYS Performed at [facility], [address], [place], [place] [address]    Report Status PENDING  Incomplete     Labs: Basic Metabolic Panel: Recent Labs  Lab 04/20/23 0921 04/20/23 1044 04/20/23 1236 04/21/23 0554 04/22/23 0407 04/23/23 0328  NA 133* 131* 133* 133* 134* 134*  K 3.8 3.5 3.4* 3.1* 4.0 3.5  CL 101 101 99 99 104 102  CO2 24 22  --  24 25 25   GLUCOSE 135* 148* 126* 97 101* 104*  BUN 13 14 12 12 14 15   CREATININE 0.83 0.86 0.90 0.88 0.86 0.90  CALCIUM  8.8* 8.7*  --  8.6* 8.6* 8.0*  MG  --   --   --   --  1.6* 2.1  PHOS  --   --   --   --  1.9* 4.7*   Liver Function Tests: Recent Labs  Lab 04/20/23 0921 04/20/23 1044 04/22/23 0407 04/23/23 0328  AST 13* 17 15 19   ALT 13 16 13 15   ALKPHOS 57 56 46 49  BILITOT 0.4 0.7 0.2 0.4  PROT 6.8 6.7 5.4* 5.1*  ALBUMIN 3.4* 2.9* 2.5* 2.4*   No results for input(s): "LIPASE", "AMYLASE" in the last 168 hours. No results for input(s): "AMMONIA" in the last 168 hours. CBC: Recent Labs  Lab 04/20/23 0921 04/20/23 1044 04/20/23 1236 04/21/23 0554 04/22/23 0407 04/23/23 0328  WBC 9.4 9.6  --  15.3* 16.0* 18.7*  NEUTROABS 8.3* 8.3*  --   --  11.7* 14.2*  HGB 10.2* 10.4* 10.5* 10.0* 9.1* 9.0*  HCT 31.3* 33.5* 31.0* 31.2* 29.7* 28.9*  MCV 84.4 88.6  --  88.6 91.1 90.9  PLT  300 305  --  316 295 303   Cardiac Enzymes: No results for input(s): "CKTOTAL", "CKMB",  "CKMBINDEX", "TROPONINI" in the last 168 hours. BNP: BNP (last 3 results) Recent Labs    04/08/23 2125 04/22/23 0328  BNP 135.9* 98.6    ProBNP (last 3 results) No results for input(s): "PROBNP" in the last 8760 hours.  CBG: Recent Labs  Lab 04/20/23 1037  GLUCAP 121*       Signed:  Donnetta Gains MD.  [facility] 04/23/2023, 2:40 PM

## 2023-04-23 NOTE — Progress Notes (Signed)
 Patient received discharge orders to go home. Patient received discharge instructions/paperwork, wound care supplies, and rollator walker. RN went over discharge paperwork/instructions with the patient and patient's wife. All questions/concerns were addressed/answered to the best of the RN's ability. Patient left the hospital stable, had discharge paperwork/instructions, had rollator walker, had wound care supplies, and had all personal belongings.

## 2023-04-23 NOTE — Progress Notes (Signed)
   04/23/23 1055  Assess: MEWS Score  Temp 97.8 F (36.6 C)  BP 101/77  MAP (mmHg) 85  Pulse Rate (!) 117  ECG Heart Rate (!) 119  Resp 18  Level of Consciousness Alert  SpO2 100 %  O2 Device Room Air  Patient Activity (if Appropriate) In bed  Assess: MEWS Score  MEWS Temp 0  MEWS Systolic 0  MEWS Pulse 2  MEWS RR 0  MEWS LOC 0  MEWS Score 2  MEWS Score Color Yellow  Assess: if the MEWS score is Yellow or Red  Were vital signs accurate and taken at a resting state? Yes  Does the patient meet 2 or more of the SIRS criteria? Yes  Does the patient have a confirmed or suspected source of infection? No  MEWS guidelines implemented  No, previously yellow, continue vital signs every 4 hours  Notify: Charge Nurse/RN  Name of Charge Nurse/RN Notified Megan Bullins, RN  Assess: SIRS CRITERIA  SIRS Temperature  0  SIRS Respirations  0  SIRS Pulse 1  SIRS WBC 1  SIRS Score Sum  2

## 2023-04-23 NOTE — Progress Notes (Signed)
 Physical Therapy Treatment Patient Details Name: Terry Page. MRN: [id number] DOB: [birth year] Today's Date: 04/23/2023   History of Present Illness Terry Page. is a 68 y.o. male who  presented  04/20/23 for his usual chemotherapy  and was noted to have weakness and  slurred speech, favoring left side. 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    PT Comments  Pt supine in bed, reports "the usual" pain in back, heels and legs, agreeable to therapy. Pt amb ~120 ft with SPC, supv for safety, no LOB, noted to drift in both directions without LOB, equal step length with step through gait pattern. Pt ind with bed mobility and modified ind with transfers using SPC. Pt reports SOB with gait, on RA with SpO2 95-98%, HR up to 141 max noted, sustains in 130s with gait- returned to room and notified RN. Pt's HR 115 upon therapist's arrival, back down to 117 at EOS in recliner; Pt reports baseline elevated HR. Continue to recommend OPPT for high level balance training.   If plan is discharge home, recommend the following: Assistance with cooking/housework;Assist for transportation;Help with stairs or ramp for entrance   Can travel by private vehicle        Equipment Recommendations  None recommended by PT    Recommendations for Other Services       Precautions / Restrictions Precautions Precautions: Fall Precaution/Restrictions Comments: has wounds on heels(peeled off scab)     Mobility  Bed Mobility Overal bed mobility: Independent                  Transfers Overall transfer level: Modified independent Equipment used: Straight cane Transfers: Sit to/from Stand, Bed to chair/wheelchair/BSC Sit to Stand: Modified independent (Device/Increase time)   Step pivot transfers: Modified independent (Device/Increase time)       General transfer comment: mod  ind with SPC for STS transfer from EOB and step piovt to recliner at bedside, no assist or cues    Ambulation/Gait Ambulation/Gait assistance: Supervision Gait Distance (Feet): 120 Feet Assistive device: Straight cane Gait Pattern/deviations: Step-through pattern, Decreased stride length, Drifts right/left Gait velocity: decreased     General Gait Details: step through gait pattern, drifts R and L with straightline gait, no LOB, reports SOB on RA with SpO2 95-98% and HR up to 141 max, maintains >130s with ambulation- RN notified and returned to room   Stairs             Wheelchair Mobility     Tilt Bed    Modified Rankin (Stroke Patients Only)       Balance Overall balance assessment: Mild deficits observed, not formally tested                                          Communication Communication Communication: No apparent difficulties  Cognition Arousal: Alert Behavior During Therapy: WFL for tasks assessed/performed   PT - Cognitive impairments: No apparent impairments                         Following commands: Intact      Cueing    Exercises      General Comments        Pertinent Vitals/Pain Pain Assessment Pain  Assessment: Faces Faces Pain Scale: Hurts a little bit Pain Location: "back, heels, legs" Pain Descriptors / Indicators:  ("the usual") Pain Intervention(s): Limited activity within patient's tolerance, Monitored during session, Repositioned    Home Living     Available Help at Discharge: Family;Available PRN/intermittently Type of Home: Apartment                  Prior Function            PT Goals (current goals can now be found in the care plan section) Acute Rehab PT Goals Patient Stated Goal: go home PT Goal Formulation: With patient Time For Goal Achievement: 05/05/23 Potential to Achieve Goals: Good Progress towards PT goals: Progressing toward goals    Frequency    Min  3X/week      PT Plan      Co-evaluation              AM-PAC PT "6 Clicks" Mobility   Outcome Measure  Help needed turning from your back to your side while in a flat bed without using bedrails?: None Help needed moving from lying on your back to sitting on the side of a flat bed without using bedrails?: None Help needed moving to and from a bed to a chair (including a wheelchair)?: None Help needed standing up from a chair using your arms (e.g., wheelchair or bedside chair)?: None Help needed to walk in hospital room?: A Little Help needed climbing 3-5 steps with a railing? : A Little 6 Click Score: 22    End of Session Equipment Utilized During Treatment: Gait belt Activity Tolerance: Patient tolerated treatment well Patient left: in chair;with call bell/phone within reach;with nursing/sitter in room Nurse Communication: Mobility status;Other (comment) (SpO2, HR) PT Visit Diagnosis: Unsteadiness on feet (R26.81)     Time: 1610-9604 PT Time Calculation (min) (ACUTE ONLY): 14 min  Charges:    $Gait Training: 8-22 mins PT General Charges $$ ACUTE PT VISIT: 1 Visit                     Tori Ahmiya Abee PT, DPT 04/23/23, 1:56 PM

## 2023-04-23 NOTE — Progress Notes (Signed)
 PROGRESS NOTE    [name].  JOA:[id number] DOB: 20-Apr-1955 DOA: 04/20/2023 PCP: Dorena Gander, MD  Chief Complaint  Patient presents with   Aphasia    Pt arrives from cancer center with complaints of ongoing weakness, sobx1 week. Per CC, pt has slurred speech and favoring L side, no c/o chest pain    Brief Narrative:   Terry Page. is Terry Page 68 y.o. 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  He presented for his usual chemotherapy today and was noted to have weakness for the last week as well as slurred speech.   MRI showed findings concerning for stroke.   Assessment & Plan:   Principal Problem:   CVA (cerebral vascular accident) ([name]) Active Problems:   Primary cancer of left upper lobe of lung ([name])   Essential hypertension   HLD (hyperlipidemia)   Wound eschar of foot   Stroke ([name])   AMS (altered mental status)   Pressure injury of skin  CVA (cerebral vascular accident) ([name]) MRI w/wo showing new round nonenhancing 7 mm focus of mild DWI hyperintensity in the medial L cerebellum.  Indeterminate, but may represent recent or subacute infarct.  Needs short interval follow up MRI with contrast in 4-6 weeks to ensure expected evolution. Carotid US , echo pending PT/OT/SLP -> outpatient PT A1c 6, LDL 78 Continue aspirin , increase lipitor to 40 mg Appreciate neurology assistance - plan to continue aspirin  325 mg daily (holding off on plavix).  Repeat MRI brain w and w/o in 4-6 weeks to ensure expected evolution.    Sinus Tachycardia Supraventricular Tachycardia  No PE on CT PE protocol Echo with normal EF, normal RVSF TSH wnl EKG with sinus tach, SVT noted on tele Not particularly symptomatic other than DOE (possibly related to the malignancy)  Will monitor after bolus  Dyspnea on Exertion He notes this is new starting about 1 week ago CT PE  protocol with increased size of irregular consolidation in the L apex 2.6x2 cm (progression of post treatment change vs residual/recurrent malignancy).  New nodules in the LLL.   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  MRI without edema or enhancement to suggest metastasis, but short interval followup up MRI head with contrast in 4-6 weeks recommended given history. On Tylenol  and tramadol  every 4-6 hours for pain. Takes dex prior to chemo   Leukocytosis Unclear cause, trend  Hypokalemia Replace and follow  Anemia Trend  Bilateral Heel Wounds Wound/ostomy to evaluate in the morning    DVT prophylaxis: aspirin  Code Status: full Family Communication: none Disposition:   Status is: Observation The patient remains OBS appropriate and will d/c before 2 midnights.   Consultants:  neurology  Procedures:  none  Antimicrobials:  Anti-infectives (From admission, onward)    None       Subjective: No complaints Notes DOE x1 week  Objective: Vitals:   04/22/23 2051 04/23/23 0023 04/23/23 0438 04/23/23 1055  BP: 125/87 116/89 115/84 101/77  Pulse: (!) 113 (!) 112 (!) 104 (!) 117  Resp: 16 16 20  18  Temp: 97.8 F (36.6 C) 98.5 F (36.9 C) 98.6 F (37 C) 97.8 F (36.6 C)  TempSrc: Oral Oral Oral Oral  SpO2: 99% 99% 98% 100%  Weight:      Height:        Intake/Output Summary (Last 24 hours) at 04/23/2023 1338 Last data filed at 04/23/2023 1100 Gross per 24 hour  Intake 1463 ml  Output --  Net 1463 ml   Filed Weights   04/21/23 1429  Weight: 64.8 kg    Examination:  General: No acute distress. Cardiovascular:  tachy, regular Lungs: Clear to auscultation bilaterally  Abdomen: Soft, nontender, nondistended  Neurological: Alert and oriented 3. Moves all extremities 4 with equal strength. Cranial nerves II through XII grossly intact. Extremities: No clubbing or cyanosis. No edema.   Data Reviewed: I have personally reviewed following labs and imaging studies  CBC: Recent Labs  Lab 04/20/23 0921 04/20/23 1044 04/20/23 1236 04/21/23 0554 04/22/23 0407 04/23/23 0328  WBC 9.4 9.6  --  15.3* 16.0* 18.7*  NEUTROABS 8.3* 8.3*  --   --  11.7* 14.2*  HGB 10.2* 10.4* 10.5* 10.0* 9.1* 9.0*  HCT 31.3* 33.5* 31.0* 31.2* 29.7* 28.9*  MCV 84.4 88.6  --  88.6 91.1 90.9  PLT 300 305  --  316 295 303    Basic Metabolic Panel: Recent Labs  Lab 04/20/23 0921 04/20/23 1044 04/20/23 1236 04/21/23 0554 04/22/23 0407 04/23/23 0328  NA 133* 131* 133* 133* 134* 134*  K 3.8 3.5 3.4* 3.1* 4.0 3.5  CL 101 101 99 99 104 102  CO2 24 22  --  24 25 25   GLUCOSE 135* 148* 126* 97 101* 104*  BUN 13 14 12 12 14 15   CREATININE 0.83 0.86 0.90 0.88 0.86 0.90  CALCIUM  8.8* 8.7*  --  8.6* 8.6* 8.0*  MG  --   --   --   --  1.6* 2.1  PHOS  --   --   --   --  1.9* 4.7*    GFR: Estimated Creatinine Clearance: 71 mL/min (by C-G formula based on SCr of 0.9 mg/dL).  Liver Function Tests: Recent Labs  Lab 04/20/23 0921 04/20/23 1044 04/22/23 0407 04/23/23 0328  AST 13* 17 15 19   ALT 13 16 13 15   ALKPHOS 57 56 46 49  BILITOT 0.4 0.7 0.2 0.4  PROT 6.8 6.7 5.4* 5.1*  ALBUMIN 3.4* 2.9* 2.5* 2.4*    CBG: Recent Labs  Lab 04/20/23 1037  GLUCAP 121*     Recent Results (from the past 240 hours)  Resp panel by RT-PCR (RSV, Flu Quincie Haroon&B, Covid) Anterior Nasal Swab     Status: None   Collection Time: 04/20/23 11:58 AM   Specimen: Anterior Nasal Swab  Result Value Ref Range Status   SARS Coronavirus 2 by RT PCR NEGATIVE NEGATIVE Final    Comment: (NOTE) SARS-CoV-2 target nucleic acids are NOT DETECTED.  The  SARS-CoV-2 RNA is generally detectable in upper respiratory specimens during the acute phase of infection. The lowest concentration of SARS-CoV-2 viral copies this assay can detect is 138 copies/mL. Samreet Edenfield negative result does not preclude SARS-Cov-2 infection and should not be used as the sole basis for treatment or other patient management decisions. Shaquille Murdy negative result may occur with  improper specimen collection/handling, submission of specimen other than nasopharyngeal swab, presence of viral mutation(s) within the areas targeted by this assay, and inadequate number of viral copies(<138 copies/mL). Shylah Dossantos negative result must be combined with  clinical observations, patient history, and epidemiological information. The expected result is Negative.  Fact Sheet for Patients:  BloggerCourse.com  Fact Sheet for Healthcare Providers:  SeriousBroker.it  This test is no t yet approved or cleared by the [place]  FDA and  has been authorized for detection and/or diagnosis of SARS-CoV-2 by FDA under an Emergency Use Authorization (EUA). This EUA will remain  in effect (meaning this test can be used) for the duration of the COVID-19 declaration under Section 564(b)(1) of the Act, 21 U.S.C.section 360bbb-3(b)(1), unless the authorization is terminated  or revoked sooner.       Influenza Rayetta Veith by PCR NEGATIVE NEGATIVE Final   Influenza B by PCR NEGATIVE NEGATIVE Final    Comment: (NOTE) The Xpert Xpress SARS-CoV-2/FLU/RSV plus assay is intended as an aid in the diagnosis of influenza from Nasopharyngeal swab specimens and should not be used as Juaquin Ludington sole basis for treatment. Nasal washings and aspirates are unacceptable for Xpert Xpress SARS-CoV-2/FLU/RSV testing.  Fact Sheet for Patients: BloggerCourse.com  Fact Sheet for Healthcare Providers: SeriousBroker.it  This test is not yet approved or  cleared by the [place]  FDA and has been authorized for detection and/or diagnosis of SARS-CoV-2 by FDA under an Emergency Use Authorization (EUA). This EUA will remain in effect (meaning this test can be used) for the duration of the COVID-19 declaration under Section 564(b)(1) of the Act, 21 U.S.C. section 360bbb-3(b)(1), unless the authorization is terminated or revoked.     Resp Syncytial Virus by PCR NEGATIVE NEGATIVE Final    Comment: (NOTE) Fact Sheet for Patients: BloggerCourse.com  Fact Sheet for Healthcare Providers: SeriousBroker.it  This test is not yet approved or cleared by the [place]  FDA and has been authorized for detection and/or diagnosis of SARS-CoV-2 by FDA under an Emergency Use Authorization (EUA). This EUA will remain in effect (meaning this test can be used) for the duration of the COVID-19 declaration under Section 564(b)(1) of the Act, 21 U.S.C. section 360bbb-3(b)(1), unless the authorization is terminated or revoked.  Performed at [facility], [address], [place], [place] [address]   Blood culture (routine x 2)     Status: None (Preliminary result)   Collection Time: 04/20/23 12:28 PM   Specimen: Left Antecubital; Blood  Result Value Ref Range Status   Specimen Description   Final    LEFT ANTECUBITAL Performed at [facility], [address], [place], [place] [address]    Special Requests   Final    BOTTLES DRAWN AEROBIC AND ANAEROBIC Blood Culture adequate volume Performed at [facility], [address], [place], [place] [address]    Culture   Final    NO GROWTH 3 DAYS Performed at [facility], [address], [place], [place] [address]    Report Status PENDING  Incomplete  Blood culture (routine x 2)     Status: None (Preliminary result)   Collection Time: 04/20/23 12:28 PM   Specimen: Porta Cath; Blood  Result  Value Ref Range Status   Specimen Description   Final    PORTA CATH Performed at [facility], [address], [place], [place] [address]    Special Requests   Final    BOTTLES DRAWN AEROBIC AND ANAEROBIC Blood Culture adequate volume Performed at [facility], [address] [place], [place] [address]    Culture   Final    NO GROWTH 3 DAYS Performed at [facility],  [address] [place], [place] [address]    Report Status PENDING  Incomplete         Radiology Studies: VAS US  LOWER EXTREMITY VENOUS (DVT) Result Date: 04/22/2023  Lower Venous DVT Study Patient Name:  Teofil Maniaci.  Date of Exam:   04/21/2023 Medical Rec #: [id number]             Accession #:    [id number] Date of Birth: August 22, 1955              Patient Gender: M Patient Age:   79 years Exam Location:  [facility] Procedure:      VAS US  LOWER EXTREMITY VENOUS (DVT) Referring Phys: [place] Levee --------------------------------------------------------------------------------  Indications: Stroke.  Risk Factors: Cancer Stage IV lung and prostate cancer with mets to bones, on chemo. Limitations: Edema and Shadowing from arterial calcification. Comparison Study: No prior study on file. Performing Technologist: Ria Chad  Examination Guidelines: Lavaun Greenfield complete evaluation includes B-mode imaging, spectral Doppler, color Doppler, and power Doppler as needed of all accessible portions of each vessel. Bilateral testing is considered an integral part of Rosenda Geffrard complete examination. Limited examinations for reoccurring indications may be performed as noted. The reflux portion of the exam is performed with the patient in reverse Trendelenburg.  +---------+---------------+---------+-----------+----------+--------------+ RIGHT    CompressibilityPhasicitySpontaneityPropertiesThrombus Aging +---------+---------------+---------+-----------+----------+--------------+ CFV       Full           Yes      Yes                                 +---------+---------------+---------+-----------+----------+--------------+ SFJ      Full           Yes      Yes                                 +---------+---------------+---------+-----------+----------+--------------+ FV Prox  Full                                                        +---------+---------------+---------+-----------+----------+--------------+ FV Mid   Full                                                        +---------+---------------+---------+-----------+----------+--------------+ FV DistalFull                                                        +---------+---------------+---------+-----------+----------+--------------+ PFV      Full                                                        +---------+---------------+---------+-----------+----------+--------------+ POP      Full  Yes      Yes                                 +---------+---------------+---------+-----------+----------+--------------+ PTV      Full                                                        +---------+---------------+---------+-----------+----------+--------------+ PERO     Full                                                        +---------+---------------+---------+-----------+----------+--------------+   +---------+---------------+---------+-----------+----------+--------------+ LEFT     CompressibilityPhasicitySpontaneityPropertiesThrombus Aging +---------+---------------+---------+-----------+----------+--------------+ CFV      Full           Yes      Yes                                 +---------+---------------+---------+-----------+----------+--------------+ SFJ      Full           Yes      Yes                                 +---------+---------------+---------+-----------+----------+--------------+ FV Prox  Full                                                         +---------+---------------+---------+-----------+----------+--------------+ FV Mid   Full                                                        +---------+---------------+---------+-----------+----------+--------------+ FV DistalFull                                                        +---------+---------------+---------+-----------+----------+--------------+ PFV      Full                                                        +---------+---------------+---------+-----------+----------+--------------+ POP      Full           Yes      Yes                                 +---------+---------------+---------+-----------+----------+--------------+ PTV      Full                                                        +---------+---------------+---------+-----------+----------+--------------+  PERO     Full                                                        +---------+---------------+---------+-----------+----------+--------------+     Summary: RIGHT: - There is no evidence of deep vein thrombosis in the lower extremity.  - No cystic structure found in the popliteal fossa.  LEFT: - There is no evidence of deep vein thrombosis in the lower extremity.  - No cystic structure found in the popliteal fossa.  *See table(s) above for measurements and observations. Electronically signed by Runell Countryman on 04/22/2023 at 3:52:32 PM.    Final    VAS US  CAROTID (at [facility] and [facility] only) Result Date: 04/22/2023 Carotid Arterial Duplex Study Patient Name:  Eivin Mascio.  Date of Exam:   04/21/2023 Medical Rec #: [id number]             Accession #:    [id number] Date of Birth: 1955/12/26              Patient Gender: M Patient Age:   68 years Exam Location:  [facility] Procedure:      VAS US  CAROTID Referring Phys: Bertell Broach PRATT --------------------------------------------------------------------------------  Indications:       CVA, Carotid artery disease, Speech  disturbance and Weakness. Risk Factors:      Hypertension, current smoker. Other Factors:     Stage IV lung and prostate cancer with mets to bones, on                    chemo.                    CT noted approximately 70-80% stenosis in bilateral carotid                    arteries. Comparison Study:  No prior exam. on file. Performing Technologist: Ria Chad  Examination Guidelines: Astor Gentle complete evaluation includes B-mode imaging, spectral Doppler, color Doppler, and power Doppler as needed of all accessible portions of each vessel. Bilateral testing is considered an integral part of Orpha Dain complete examination. Limited examinations for reoccurring indications may be performed as noted.  Right Carotid Findings: +----------+--------+--------+--------+-------------------------+--------+           PSV cm/sEDV cm/sStenosisPlaque Description       Comments +----------+--------+--------+--------+-------------------------+--------+ CCA Prox  78      23                                                +----------+--------+--------+--------+-------------------------+--------+ CCA Mid   60      13              hyperechoic and calcific          +----------+--------+--------+--------+-------------------------+--------+ CCA Distal62      24                                                +----------+--------+--------+--------+-------------------------+--------+ ICA Prox  92      41  heterogenous and calcific         +----------+--------+--------+--------+-------------------------+--------+ ICA Mid   188     82      60-79%  calcific                          +----------+--------+--------+--------+-------------------------+--------+ ICA Distal100     38                                                +----------+--------+--------+--------+-------------------------+--------+ ECA       73      15              hyperechoic and calcific           +----------+--------+--------+--------+-------------------------+--------+ +----------+--------+-------+----------------+-------------------+           PSV cm/sEDV cmsDescribe        Arm Pressure (mmHG) +----------+--------+-------+----------------+-------------------+ Subclavian122     19     Diffused plaque.                    +----------+--------+-------+----------------+-------------------+ +---------+--------+---+--------+--+ VertebralPSV cm/s120EDV cm/s36 +---------+--------+---+--------+--+  Left Carotid Findings: +----------+--------+--------+--------+-------------------------+--------+           PSV cm/sEDV cm/sStenosisPlaque Description       Comments +----------+--------+--------+--------+-------------------------+--------+ CCA Prox                  Occluded                                  +----------+--------+--------+--------+-------------------------+--------+ CCA Mid                   Occluded                                  +----------+--------+--------+--------+-------------------------+--------+ CCA [id number]             >50%                                      +----------+--------+--------+--------+-------------------------+--------+ ICA Prox  109     47      40-59%  heterogenous and calcific         +----------+--------+--------+--------+-------------------------+--------+ ICA Mid   47      28                                                +----------+--------+--------+--------+-------------------------+--------+ ICA Distal37      22                                                +----------+--------+--------+--------+-------------------------+--------+ ECA       216     102     >50%    heterogenous and calcific         +----------+--------+--------+--------+-------------------------+--------+ +----------+--------+--------+----------------+-------------------+           PSV cm/sEDV cm/sDescribe  Arm Pressure  (mmHG) +----------+--------+--------+----------------+-------------------+ Subclavian303     24      Diffused plaque.                    +----------+--------+--------+----------------+-------------------+ +---------+--------+---+--------+--+------------------+ VertebralPSV cm/s125EDV cm/s48Stenotic at origin +---------+--------+---+--------+--+------------------+   Summary: Right Carotid: Velocities in the right ICA are consistent with Caige Almeda 60-79%                stenosis. Left Carotid: Velocities in the left ICA are consistent with Kyrene Longan 40-59% stenosis.               The ECA appears >50% stenosed. Vertebrals:  Bilateral vertebral arteries demonstrate antegrade flow. Left:              elevated velocities. Subclavians: Left subclavian artery was stenotic. Normal flow hemodynamics were              seen in the right subclavian artery. *See table(s) above for measurements and observations.  Suggest Peripheral Vascular Consult. Electronically signed by Ardella Beaver MD on 04/22/2023 at 10:50:30 AM.    Final         Scheduled Meds:  aspirin   300 mg Rectal Daily   Or   aspirin   325 mg Oral Daily   atorvastatin   40 mg Oral Daily   Chlorhexidine  Gluconate Cloth  6 each Topical Daily   dronabinol   5 mg Oral BID AC   enoxaparin  (LOVENOX ) injection  40 mg Subcutaneous Q24H   feeding supplement  237 mL Oral BID BM   folic acid   1 mg Oral Daily   irbesartan   300 mg Oral Daily   senna-docusate  2 tablet Oral BID   sodium chloride  flush  3 mL Intravenous Q12H   Continuous Infusions:  lactated ringers         LOS: 1 day    Time spent: over 30 min    Donnetta Gains, MD [facility]   To contact the attending provider between 7A-7P or the covering provider during after hours 7P-7A, please log into the web site [email or web address] and access using universal [place] password for that web site. If you do not have the password, please call the hospital operator.  04/23/2023, 1:38 PM

## 2023-04-23 NOTE — Care Management (Signed)
 Received call from [facility], who reports patient is being discharged and needs his DME: rollator prior to dc.   This RNCM spoke with Jermaine with [employer] to inquire about DME: rollator. Jermaine reports the delivery driver is at the hospital now. Notified Hillary.   No additional TOC needs at this time.

## 2023-04-23 NOTE — Progress Notes (Signed)
 Initial Nutrition Assessment  DOCUMENTATION CODES:   Non-severe (moderate) malnutrition in context of chronic illness  INTERVENTION:  - Regular diet.  - Ensure Plus High Protein po BID, each supplement provides 350 kcal and 20 grams of protein. - Continue Marinol  as medically appropriate.  - Monitor weight trends.   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  GOAL:   Patient will meet greater than or equal to 90% of their needs  MONITOR:   PO intake, Supplement acceptance, Weight trends  REASON FOR ASSESSMENT:   Consult Assessment of nutrition requirement/status  ASSESSMENT:   68 y.o. 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 Admitted CVA.  Patient eating lunch at time of visit. He reports a UBW of 150# and about a 10# weight loss over the past 1 month.  Per EMR, weight stable from April to December last year. He is noted to have lost 10# or 6.6% over the past 1 month, which is significant for the time frame.  Patient is unsure of a cause as he reports eating very well at home with good appetite.  He endorses eating 3 meals a day plus snacks and Boost once daily.   His current appetite remains good and patient endorses eating well since admit. He is documented to be consuming 50-100% of meals.  He typically drinks strawberry Boost but agreeable to receive Ensure during admission.  Encouraged patient to continue to eat 3 meals a day and also try to have meals and ONS between meals to combat weight loss.     Medications reviewed and include: Marinol  BID, 1mg  folic acid , Senokot  Labs reviewed:  Na 134 HA1C  6.0   NUTRITION - FOCUSED PHYSICAL EXAM:  Flowsheet Row Most Recent Value  Orbital Region No depletion  Upper Arm Region Moderate depletion  Thoracic and Lumbar Region No depletion  Buccal Region Mild depletion  Temple Region No depletion  Clavicle Bone Region Mild depletion  Clavicle and Acromion Bone Region Mild depletion  Scapular Bone Region Unable to assess  Dorsal Hand No depletion  Patellar Region No depletion  Anterior Thigh Region No depletion  Posterior Calf Region Mild depletion  Edema (RD Assessment) None  Hair Reviewed  Eyes Reviewed  Mouth Reviewed  Skin Reviewed  Nails Reviewed       Diet Order:   Diet Order             Diet regular Fluid consistency: Thin  Diet effective now                   EDUCATION NEEDS:  Education needs have been addressed  Skin:  Skin Assessment: Skin Integrity Issues: Skin Integrity Issues:: Stage III Stage III: Left and Right Heel  Last BM:  4/20  Height:  Ht Readings from Last 1 Encounters:  04/21/23 5\' 9"  (1.753 m)   Weight:  Wt Readings from Last 1 Encounters:  04/21/23 64.8 kg   BMI:  Body mass index is 21.1 kg/m.  Estimated Nutritional Needs:  Kcal:  1950-2250 kcals Protein:  90-105 grams Fluid:  >/= 2L    Scheryl Cushing RD, LDN Contact via Secure  Chat.

## 2023-04-23 NOTE — Evaluation (Signed)
 Speech Language Pathology Evaluation Patient Details Name: Terry Page. 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   Port-A-Cath in place 07/08/2021   Hyperkalemia 04/28/2021   Elevated LFTs 04/28/2021   Falls 04/28/2021   Chronic back pain 04/28/2021   Hemoptysis 05/18/2020   Protein-calorie malnutrition, severe 05/07/2020   Abdominal pain    Necrotizing pneumonia ([name]) 05/03/2020   Bullous emphysema ([name])    Acute respiratory failure with hypoxia ([name]) 04/12/2020   Pneumonia due to COVID-19 virus 04/10/2020   Primary cancer of left upper lobe of lung ([name]) 04/10/2020   Cavitary pneumonia 04/10/2020   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   Essential hypertension 04/10/2020   HLD (hyperlipidemia) 04/10/2020   Sepsis ([name]) 04/10/2020   Recurrent spontaneous pneumothorax 06/01/2019   Malignant neoplasm of prostate ([name]) 05/20/2019   Hyponatremia 12/04/2017   AKI (acute kidney injury) ([name]) 12/04/2017   Incidental lung nodule, greater than or equal to 8mm 10/22/2017   COPD with acute exacerbation ([name])    Spontaneous pneumothorax 10/20/2017   Tobacco abuse    Alcohol abuse    Past Medical History:  Past Medical History:  Diagnosis Date   Alcohol abuse    Asthma    Bullous emphysema ([name])    Essential hypertension 04/10/2020   Hemorrhoids    History of radiation therapy 04/08/20-04/19/20   IMRT-  Left lung- Dr. Retta Caster   Incidental lung nodule, greater than or equal to 8mm 10/22/2017   Left upper lobe - discovered on CTA   Prostate cancer ([name])    Spontaneous pneumothorax 10/20/2017   right   Tobacco abuse    Past Surgical History:  Past Surgical History:  Procedure Laterality Date   BACK SURGERY     IR IMAGING GUIDED PORT INSERTION  06/29/2021   PROSTATE BIOPSY     HPI:  Terry Page. is a 68 yr old male who  presented  04/20/23 for his usual chemotherapy  and was noted to have weakness and slurred speech, favoring left side. 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   Assessment / Plan / Recommendation Clinical Impression  Pt participated in speech/cognitive/language assessment. Dysarthria is resolved; speech is clear and fluent.  Expressive and receptive language are intact with preserved ability to follow complex commands, understand abstract language, and express himself coherently.  Short-term recall, working memory, higher-level attention and verbal problem-solving are WNL. There is mild right lower facial asymmetry/flattened nasolabial fold, c/w CN VII deficit - speech is not impacted. Overall, cognition/speech appear to be WNL. No f/u recommended - our service will sign off.    SLP Assessment  SLP Recommendation/Assessment: Patient does not need any further Speech Lanaguage Pathology Services SLP Visit Diagnosis: Cognitive communication deficit (R41.841)    Recommendations for follow up therapy are one component of a multi-disciplinary discharge planning  process, led by the attending physician.  Recommendations may be updated based on patient status, additional functional criteria and insurance authorization.    Follow Up Recommendations  No SLP follow up    Assistance Recommended at Discharge  None                 SLP  Evaluation Cognition  Overall Cognitive Status: Within Functional Limits for tasks assessed Arousal/Alertness: Awake/alert Orientation Level: Oriented X4 Attention: Selective Selective Attention: Appears intact Memory: Appears intact Awareness: Appears intact Problem Solving: Appears intact Executive Function: Reasoning Reasoning: Appears intact Safety/Judgment: Appears intact       Comprehension  Auditory Comprehension Overall Auditory Comprehension: Appears within functional limits for tasks assessed Reading Comprehension Reading Status: Not tested    Expression Expression Primary Mode of Expression: Verbal Verbal Expression Overall Verbal Expression: Appears within functional limits for tasks assessed   Oral / Motor  Oral Motor/Sensory Function Overall Oral Motor/Sensory Function: Mild impairment Facial ROM: Reduced right;Suspected CN VII (facial) dysfunction Facial Symmetry: Abnormal symmetry right;Suspected CN VII (facial) dysfunction Lingual ROM: Within Functional Limits Lingual Symmetry: Within Functional Limits Motor Speech Overall Motor Speech: Appears within functional limits for tasks assessed            Myna Asal Laurice 04/23/2023, 11:50 AM Mylinda Asa L. Beatris Lincoln, MA CCC/SLP Clinical Specialist - Acute Care SLP Acute Rehabilitation Services Office number [phone]

## 2023-04-23 NOTE — Progress Notes (Signed)
 Patient's visit was not performed as he was exhibiting slurred speech and generalized weakness during evaluation by palliative care NP. Recommendation was ER evaluation to evaluate for acute neuro episode versus brain metastasis.   We will defer treatment to next week pending ER evaluation.

## 2023-04-23 NOTE — TOC Transition Note (Signed)
 Transition of Care [facility]) - Discharge Note   Patient Details  Name: Terry Page. MRN: [id number] Date of Birth: 05-15-1955  [facility] CM/SW Contact:  Gertha Ku, LCSW Phone Number: 04/23/2023, 3:17 PM   Clinical Narrative:     CSW spoke with the Pt's spouse regarding the recommendation for a rollator. Pt's spouse reported no preference for a DME company. A referral was sent to [facility] for delivery of the equipment to the pt's room prior to discharge.  Pt's wife stated that he is currently active with [facility]. CSW confirmed that the pt is being followed and will require new orders. Pt's wife stated she will pick pt up upon d/c.TOC sign off.   Final next level of care: Home w Home Health Services Barriers to Discharge: Barriers Resolved   Patient Goals and CMS Choice Patient states their goals for this hospitalization and ongoing recovery are:: retrun home CMS Medicare.gov Compare Post Acute Care list provided to:: Patient   [place] ownership interest in [facility].provided to:: Patient    Discharge Placement                    Patient and family notified of of transfer: 04/23/23  Discharge Plan and Services Additional resources added to the After Visit Summary for     Discharge Planning Services: CM Consult                                 Social Drivers of Health (SDOH) Interventions SDOH Screenings   Food Insecurity: No Food Insecurity (04/22/2023)  Housing: Low Risk  (04/22/2023)  Transportation Needs: No Transportation Needs (04/22/2023)  Recent Concern: Transportation Needs - Unmet Transportation Needs (04/21/2023)  Utilities: Not At Risk (04/22/2023)  Social Connections: Moderately Integrated (04/21/2023)  Tobacco Use: Medium Risk (04/21/2023)     Readmission Risk Interventions    04/29/2021    2:00 PM  Readmission Risk Prevention Plan  Transportation Screening Complete  PCP or Specialist  Appt within 5-7 Days Complete  Home Care Screening Complete  Medication Review (RN CM) Complete

## 2023-04-25 ENCOUNTER — Inpatient Hospital Stay

## 2023-04-25 ENCOUNTER — Inpatient Hospital Stay: Admitting: Physician Assistant

## 2023-04-25 ENCOUNTER — Other Ambulatory Visit: Payer: Self-pay | Admitting: Hematology and Oncology

## 2023-04-25 ENCOUNTER — Encounter: Payer: Self-pay | Admitting: Nurse Practitioner

## 2023-04-25 ENCOUNTER — Inpatient Hospital Stay (HOSPITAL_BASED_OUTPATIENT_CLINIC_OR_DEPARTMENT_OTHER): Admitting: Nurse Practitioner

## 2023-04-25 ENCOUNTER — Ambulatory Visit: Payer: Medicare HMO | Admitting: Podiatry

## 2023-04-25 ENCOUNTER — Other Ambulatory Visit (HOSPITAL_COMMUNITY): Payer: Self-pay

## 2023-04-25 VITALS — BP 148/87 | HR 108 | Temp 97.4°F | Resp 17 | Ht 69.0 in | Wt 152.5 lb

## 2023-04-25 DIAGNOSIS — Z79891 Long term (current) use of opiate analgesic: Secondary | ICD-10-CM | POA: Diagnosis not present

## 2023-04-25 DIAGNOSIS — E871 Hypo-osmolality and hyponatremia: Secondary | ICD-10-CM | POA: Diagnosis not present

## 2023-04-25 DIAGNOSIS — L608 Other nail disorders: Secondary | ICD-10-CM | POA: Diagnosis not present

## 2023-04-25 DIAGNOSIS — Z87891 Personal history of nicotine dependence: Secondary | ICD-10-CM | POA: Diagnosis not present

## 2023-04-25 DIAGNOSIS — G893 Neoplasm related pain (acute) (chronic): Secondary | ICD-10-CM | POA: Diagnosis not present

## 2023-04-25 DIAGNOSIS — C3412 Malignant neoplasm of upper lobe, left bronchus or lung: Secondary | ICD-10-CM

## 2023-04-25 DIAGNOSIS — C7951 Secondary malignant neoplasm of bone: Secondary | ICD-10-CM | POA: Diagnosis not present

## 2023-04-25 DIAGNOSIS — T451X5D Adverse effect of antineoplastic and immunosuppressive drugs, subsequent encounter: Secondary | ICD-10-CM | POA: Diagnosis not present

## 2023-04-25 DIAGNOSIS — Z515 Encounter for palliative care: Secondary | ICD-10-CM

## 2023-04-25 DIAGNOSIS — Z79899 Other long term (current) drug therapy: Secondary | ICD-10-CM | POA: Diagnosis not present

## 2023-04-25 DIAGNOSIS — D649 Anemia, unspecified: Secondary | ICD-10-CM

## 2023-04-25 DIAGNOSIS — C3490 Malignant neoplasm of unspecified part of unspecified bronchus or lung: Secondary | ICD-10-CM

## 2023-04-25 DIAGNOSIS — D72829 Elevated white blood cell count, unspecified: Secondary | ICD-10-CM | POA: Diagnosis not present

## 2023-04-25 DIAGNOSIS — R530 Neoplastic (malignant) related fatigue: Secondary | ICD-10-CM | POA: Diagnosis not present

## 2023-04-25 DIAGNOSIS — Z5111 Encounter for antineoplastic chemotherapy: Secondary | ICD-10-CM | POA: Diagnosis present

## 2023-04-25 DIAGNOSIS — Z95828 Presence of other vascular implants and grafts: Secondary | ICD-10-CM

## 2023-04-25 DIAGNOSIS — D509 Iron deficiency anemia, unspecified: Secondary | ICD-10-CM | POA: Diagnosis not present

## 2023-04-25 DIAGNOSIS — D6481 Anemia due to antineoplastic chemotherapy: Secondary | ICD-10-CM | POA: Diagnosis not present

## 2023-04-25 DIAGNOSIS — I10 Essential (primary) hypertension: Secondary | ICD-10-CM | POA: Diagnosis not present

## 2023-04-25 DIAGNOSIS — C61 Malignant neoplasm of prostate: Secondary | ICD-10-CM | POA: Diagnosis not present

## 2023-04-25 LAB — CMP (CANCER CENTER ONLY)
ALT: 13 U/L (ref 0–44)
AST: 13 U/L — ABNORMAL LOW (ref 15–41)
Albumin: 3.1 g/dL — ABNORMAL LOW (ref 3.5–5.0)
Alkaline Phosphatase: 57 U/L (ref 38–126)
Anion gap: 4 — ABNORMAL LOW (ref 5–15)
BUN: 16 mg/dL (ref 8–23)
CO2: 25 mmol/L (ref 22–32)
Calcium: 8.5 mg/dL — ABNORMAL LOW (ref 8.9–10.3)
Chloride: 105 mmol/L (ref 98–111)
Creatinine: 0.82 mg/dL (ref 0.61–1.24)
GFR, Estimated: >60 mL/min (ref >60)
Glucose, Bld: 88 mg/dL (ref 70–99)
Potassium: 3.8 mmol/L (ref 3.5–5.1)
Sodium: 134 mmol/L — ABNORMAL LOW (ref 135–145)
Total Bilirubin: 0.3 mg/dL (ref 0.0–1.2)
Total Protein: 5.8 g/dL — ABNORMAL LOW (ref 6.5–8.1)

## 2023-04-25 LAB — CBC WITH DIFFERENTIAL (CANCER CENTER ONLY)
Abs Immature Granulocytes: 0.12 10*3/uL — ABNORMAL HIGH (ref 0.00–0.07)
Basophils Absolute: 0 10*3/uL (ref 0.0–0.1)
Basophils Relative: 0 %
Eosinophils Absolute: 0 10*3/uL (ref 0.0–0.5)
Eosinophils Relative: 0 %
HCT: 26.1 % — ABNORMAL LOW (ref 39.0–52.0)
Hemoglobin: 8.6 g/dL — ABNORMAL LOW (ref 13.0–17.0)
Immature Granulocytes: 1 %
Lymphocytes Relative: 7 %
Lymphs Abs: 1.1 10*3/uL (ref 0.7–4.0)
MCH: 28.5 pg (ref 26.0–34.0)
MCHC: 33 g/dL (ref 30.0–36.0)
MCV: 86.4 fL (ref 80.0–100.0)
Monocytes Absolute: 1.2 10*3/uL — ABNORMAL HIGH (ref 0.1–1.0)
Monocytes Relative: 8 %
Neutro Abs: 12.5 10*3/uL — ABNORMAL HIGH (ref 1.7–7.7)
Neutrophils Relative %: 84 %
Platelet Count: 324 10*3/uL (ref 150–400)
RBC: 3.02 MIL/uL — ABNORMAL LOW (ref 4.22–5.81)
RDW: 22.7 % — ABNORMAL HIGH (ref 11.5–15.5)
WBC Count: 14.9 10*3/uL — ABNORMAL HIGH (ref 4.0–10.5)
nRBC: 0.3 % — ABNORMAL HIGH (ref 0.0–0.2)

## 2023-04-25 LAB — CULTURE, BLOOD (ROUTINE X 2)
Culture: NO GROWTH
Culture: NO GROWTH
Special Requests: ADEQUATE
Special Requests: ADEQUATE

## 2023-04-25 LAB — SAMPLE TO BLOOD BANK

## 2023-04-25 LAB — TSH: TSH: 3.15 u[IU]/mL (ref 0.350–4.500)

## 2023-04-25 MED ORDER — HEPARIN SOD (PORK) LOCK FLUSH 100 UNIT/ML IV SOLN
500.0000 [IU] | Freq: Once | INTRAVENOUS | Status: AC
  Administered 2023-04-25: 500 [IU]

## 2023-04-25 MED ORDER — LIDOCAINE-PRILOCAINE 2.5-2.5 % EX CREA
1.0000 | TOPICAL_CREAM | CUTANEOUS | 0 refills | Status: DC | PRN
Start: 2023-04-25 — End: 2023-11-03
  Filled 2023-04-25: qty 30, 30d supply, fill #0

## 2023-04-25 MED ORDER — SODIUM CHLORIDE 0.9% FLUSH
10.0000 mL | Freq: Once | INTRAVENOUS | Status: AC
  Administered 2023-04-25: 10 mL

## 2023-04-25 MED ORDER — LIDOCAINE 5 % EX PTCH: 1.0000 | MEDICATED_PATCH | CUTANEOUS | 0 refills | Status: DC

## 2023-04-25 MED ORDER — TRAMADOL HCL 50 MG PO TABS
50.0000 mg | ORAL_TABLET | Freq: Four times a day (QID) | ORAL | 0 refills | Status: DC | PRN
  Filled 2023-04-25: qty 120, 15d supply, fill #0

## 2023-04-25 MED ORDER — LIDOCAINE 5 % EX PTCH
1.0000 | MEDICATED_PATCH | CUTANEOUS | 0 refills | Status: DC
  Filled 2023-04-25 – 2023-05-04 (×2): qty 30, 30d supply, fill #0

## 2023-04-25 NOTE — Progress Notes (Signed)
 [facility] [phone]   [phone]  PROGRESS NOTE  Patient Care Team: Terry Gander, MD as PCP - General (Family Medicine)  Hematological/Oncological History # Metastatic Adenocarcinoma of the Lung  01/07/2020 : CT of the chest with contrast performed which showed interval enlargement of a spiculated nodule in the central left upper lobe measuring 1.2 x 1.0 cm and highly suspicious for primary lung malignancy 02/06/2020 :PET scan performed which showed a hypermetabolic irregular solid 1.3 cm left upper lobe pulmonary nodule compatible with malignancy without any other hypermetabolic lesions 01/06/7827- 04/19/2020:  SBRT to the lung lesion 04/28/2021: CT C/A/P showed multifocal lytic bone metastases are identified. Lesion within the spine of the left scapula and lesions involving bilateral iliac bones and left sacral wing. Establish care with Dr. Rosaline Page  05/03/2021: biopsy of right iliac lytic lesion showed metastatic carcinoma, consistent with lung primary.  07/08/2021: Cycle 1 Day 1 of Carbo/Pem/Pem 07/29/2021: Cycle 2 Day 1 of Carbo/Pem/Pem 08/11/2021-08/24/2021: Received palliative radiation to osseous metastasis in the left shoulder. 30 Gy in 10 fractions.  08/19/2021: Cycle 3 Day 1 of Carbo/Pem/Pem 09/09/2021: Cycle 4 Day 1 of Carbo/Pem/Pem 09/23/2021: CT CAP: stable disease 09/30/2021: Cycle 5 Day 1 of Pem/Pem 10/21/2021: Cycle 6 Day 1 of Pembrolizumab . Held pemetrexed  due to anemia. 11/16/2021: Cycle 7 Day 1 of Pem/Pem.   12/16/2021: Cycle 8 Day 1 of Pem/Pem.   01/06/2022: Cycle 9 Day 1 of Pem/Pem.  01/27/2022: Cycle 10 Day 1 of Pem/Pem 02/17/2022: Cycle 11 Day 1 of Pem/Pem 03/10/2022: Cycle 12 Day 1 of Pem/Pem (HELD due to anemia with Hgb of 6.4) 03/17/2022: Cycle 12 Day 1 of Pem/Pem (HELD pemextrexed due to cytopenias/kidney function) 04/10/2022: Cycle 13 Day 1 of Pem/Pem (HELD pemextrexed due to cytopenias/kidney function) 04/28/2022: Cycle 14 Day 1 of Pem/Pem  (HELD pemextrexed due to cytopenias/kidney function) 05/23/2022:  Cycle 15 Day 1 of Pem/Pem (HELD pemextrexed due to cytopenias/kidney function) 06/15/2022: Cycle 16 Day 1 of Pembrolizumab .  07/07/2022: Cycle 17 Day 1 of Pembrolizumab .  07/28/2022: Cycle 18 Day 1 of Pembrolizumab  08/18/2022: Cycle 19 Day 1 of Pembrolizumab  HELD due to hyponatremia of 118, new bone metastases. 08/25/2022: Cycle 19 Day 1 of Pembrolizumab  08/28/2022-09/13/2022: Received palliative radiation to right pelvis with total dose of 30 Gy in 10 Fx.  09/22/2022:  Cycle 20 Day 1 of Pembrolizumab  10/13/2022:  Cycle 21 Day 1 of Pembrolizumab  12/11/2022: Cycle 1 Day 1 of Docetaxel /Ramucircumab.  01/05/2023: Cycle 2 Day 1 of Docetaxel /Ramucircumab.  01/26/2023: Cycle 3 Day 1 of Docetaxel /Ramucircumab 02/16/2023:  Cycle 4 Day 1 of Docetaxel /Ramucircumab 03/09/2023: Cycle 5 Day 1 of Docetaxel /Ramucircumab 03/30/2023:  Cycle 6 Day 1 of Docetaxel /Ramucircumab 04/20/2023-04/23/2023: Admitted for slurred speech and weakness. MRI brain concerning for stroke. CTA chest found progression of irregular consolidation in the left apex.    #Adenocarcinoma of the Prostate, T1cN0M0 08/19/2019-10/07/2019: 70 Gy in 28 fractions of 2.5 Gy.  Radiation to the prostate was under the care of Dr. Kenith Page  Interval History:  Terry Page. 68 y.o. male with medical history significant for metastatic adenocarcinoma of the lung who presents for a follow up visit. The patient's last visit was on 04/20/2023. In the interim since the last visit, he was admitted for a stroke.   On exam today, Terry Page states he is feeling better since hospital discharge. His energy levels are improving. His appetite is unchanged and denies any noticeable weight loss. He denies nausea, vomiting or bowel habit changes. He denies easy bruising or  signs of bleeding.  Overall he feels back to his baseline level of health and willing and able to proceed with treatment at this time.   Full 10 point ROS is otherwise negative.  MEDICAL HISTORY:  Past Medical History:  Diagnosis Date   Alcohol abuse    Asthma    Bullous emphysema ([name])    Essential hypertension 04/10/2020   Hemorrhoids    History of radiation therapy 04/08/20-04/19/20   IMRT- Left lung- Dr. Retta Page   Incidental lung nodule, greater than or equal to 8mm 10/22/2017   Left upper lobe - discovered on CTA   Prostate cancer ([name])    Spontaneous pneumothorax 10/20/2017   right   Tobacco abuse     SURGICAL HISTORY: Past Surgical History:  Procedure Laterality Date   BACK SURGERY     IR IMAGING GUIDED PORT INSERTION  06/29/2021   PROSTATE BIOPSY      SOCIAL HISTORY: Social History   Socioeconomic History   Marital status: Single    Spouse name: Not on file   Number of children: Not on file   Years of education: Not on file   Highest education level: Not on file  Occupational History   Occupation: retired  Tobacco Use   Smoking status: Former    Current packs/day: 0.00    Types: Cigarettes    Quit date: 11/22/2019    Years since quitting: 3.4   Smokeless tobacco: Never   Tobacco comments:    Patient reports quit 3 years ago. 06/25/20. [facility]  Vaping Use   Vaping status: Never Used  Substance and Sexual Activity   Alcohol use: Yes    Alcohol/week: 12.0 standard drinks of alcohol    Types: 12 Cans of beer per week    Comment: daily sometimes   Drug use: No   Sexual activity: Not Currently  Other Topics Concern   Not on file  Social History Narrative   Not on file   Social Drivers of Health   Financial Resource Strain: Not on file  Food Insecurity: No Food Insecurity (04/22/2023)   Hunger Vital Sign    Worried About Running Out of Food in the Last Year: Never true    Ran Out of Food in the Last Year: Never true  Transportation Needs: No Transportation Needs (04/22/2023)   PRAPARE - Administrator, Civil Service (Medical): No    Lack of Transportation (Non-Medical):  No  Recent Concern: Transportation Needs - Unmet Transportation Needs (04/21/2023)   PRAPARE - Administrator, Civil Service (Medical): Yes    Lack of Transportation (Non-Medical): No  Physical Activity: Not on file  Stress: Not on file  Social Connections: Moderately Integrated (04/21/2023)   Social Connection and Isolation Panel [NHANES]    Frequency of Communication with Friends and Family: More than three times a week    Frequency of Social Gatherings with Friends and Family: More than three times a week    Attends Religious Services: 1 to 4 times per year    Active Member of [employer] or Organizations: No    Attends Banker Meetings: Never    Marital Status: Married  Catering manager Violence: Not At Risk (04/22/2023)   Humiliation, Afraid, Rape, and Kick questionnaire    Fear of Current or Ex-Partner: No    Emotionally Abused: No    Physically Abused: No    Sexually Abused: No    FAMILY HISTORY: Family History  Problem Relation Age of  Onset   Cancer Cousin        maternal cousin   Cancer Cousin        paternal cousin   Cancer Cousin    Colon polyps Neg Hx    Pancreatic disease Neg Hx    Pancreatic cancer Neg Hx    Breast cancer Neg Hx    Colon cancer Neg Hx     ALLERGIES:  is allergic to morphine  and oxycodone .  MEDICATIONS:  Current Outpatient Medications  Medication Sig Dispense Refill   acetaminophen  (TYLENOL ) 325 MG tablet Take 2 tablets (650 mg total) by mouth every 6 (six) hours as needed for mild pain or headache (fever >/= 101).     albuterol  (VENTOLIN  HFA) 108 (90 Base) MCG/ACT inhaler Inhale 2 puffs into the lungs every 6 (six) hours as needed for wheezing or shortness of breath.     [Paused] amLODipine  (NORVASC ) 10 MG tablet Take 1 tablet by mouth daily. (Patient not taking: Reported on 04/20/2023)     aspirin  EC 325 MG tablet Take 1 tablet (325 mg total) by mouth daily. 30 tablet 1   atorvastatin  (LIPITOR) 40 MG tablet Take 1 tablet (40  mg total) by mouth daily. 30 tablet 1   cyanocobalamin  (VITAMIN B12) 1000 MCG tablet Take 1 tablet (1,000 mcg total) by mouth daily. (Patient taking differently: Take 1,000 mcg by mouth as needed (When directed by physican).) 90 tablet 1   dronabinol  (MARINOL ) 5 MG capsule Take 1 capsule (5 mg total) by mouth 2 (two) times daily before lunch and supper. 60 capsule 0   ferrous sulfate  325 (65 FE) MG EC tablet Take 1 tablet (325 mg total) by mouth daily with breakfast. (Patient not taking: Reported on 04/20/2023) 30 tablet 3   folic acid  (FOLVITE ) 1 MG tablet Take 1 tablet (1 mg total) by mouth daily. 90 tablet 3   hydrOXYzine  (ATARAX ) 25 MG tablet Take 1 tablet (25 mg total) by mouth at bedtime as needed for itching. 30 tablet 11   ipratropium-albuterol  (DUONEB) 0.5-2.5 (3) MG/3ML SOLN Take 3 mLs by nebulization every 6 (six) hours as needed. (Patient taking differently: Take 3 mLs by nebulization every 6 (six) hours as needed (shortness of breathing or wheezing).) 360 mL 0   lidocaine  (LIDODERM ) 5 % Place 1 patch onto the skin daily. Remove & Discard patch within 12 hours or as directed by MD. Apply to low back 30 patch 0   lidocaine -prilocaine  (EMLA ) cream Apply 1 Application topically as needed. 30 g 0   [Paused] olmesartan (BENICAR) 40 MG tablet Take 40 mg by mouth daily.     polyethylene glycol (MIRALAX ) 17 g packet Take 17 g by mouth daily. (Patient not taking: Reported on 04/20/2023) 30 each 2   potassium chloride  SA (KLOR-CON  M) 20 MEQ tablet TAKE 1 TABLET(20 MEQ) BY MOUTH DAILY 90 tablet 0   SENNA S 8.6-50 MG tablet Take 2 tablets by mouth at bedtime as needed for mild constipation or moderate constipation.     senna-docusate (STIMULANT LAXATIVE) 8.6-50 MG tablet Take 2 tablets by mouth 2 (two) times daily. May add additional tablet as needed 150 tablet 3   sodium chloride  1 g tablet Take 1 tablet (1 g total) by mouth 3 (three) times daily with meals. 90 tablet 3   thiamine  100 MG tablet Take 1  tablet (100 mg total) by mouth daily. (Patient not taking: Reported on 04/20/2023)     traMADol  (ULTRAM ) 50 MG tablet Take 1-2 tablets (50-100  mg total) by mouth every 6 (six) hours as needed. 120 tablet 0   No current facility-administered medications for this visit.    REVIEW OF SYSTEMS:   Constitutional: ( - ) fevers, ( - )  chills , ( - ) night sweats Eyes: ( - ) blurriness of vision, ( - ) double vision, ( - ) watery eyes Ears, nose, mouth, throat, and face: ( - ) mucositis, ( - ) sore throat Respiratory: ( - ) cough, ( - ) dyspnea, ( - ) wheezes Cardiovascular: ( - ) palpitation, ( - ) chest discomfort, ( - ) lower extremity swelling Gastrointestinal:  ( - ) nausea, ( - ) heartburn, ( - ) change in bowel habits Skin: ( - ) abnormal skin rashes Lymphatics: ( - ) new lymphadenopathy, ( - ) easy bruising Neurological: ( - ) numbness, ( - ) tingling, ( - ) new weaknesses Behavioral/Psych: ( - ) mood change, ( - ) new changes  All other systems were reviewed with the patient and are negative.  PHYSICAL EXAMINATION: ECOG PERFORMANCE STATUS: 1 - Symptomatic but completely ambulatory  There were no vitals filed for this visit.    There were no vitals filed for this visit.  GENERAL: Well-appearing elderly African-American male, alert, no distress and comfortable SKIN: skin color, texture, turgor are normal, no rashes or significant lesions EYES: conjunctiva are pink and non-injected, sclera clear LUNGS: clear to auscultation and percussion with normal breathing effort HEART: regular rate & rhythm and no murmurs and no lower extremity edema Musculoskeletal: no cyanosis of digits and no clubbing  PSYCH: alert & oriented x 3, fluent speech NEURO: no focal motor/sensory deficits  LABORATORY DATA:  I have reviewed the data as listed    Latest Ref Rng & Units 04/25/2023   10:20 AM 04/23/2023    3:28 AM 04/22/2023    4:07 AM  CBC  WBC 4.0 - 10.5 K/uL 14.9  18.7  16.0   Hemoglobin 13.0  - 17.0 g/dL 8.6  9.0  9.1   Hematocrit 39.0 - 52.0 % 26.1  28.9  29.7   Platelets 150 - 400 K/uL 324  303  295        Latest Ref Rng & Units 04/25/2023   10:20 AM 04/23/2023    3:28 AM 04/22/2023    4:07 AM  CMP  Glucose 70 - 99 mg/dL 88  161  096   BUN 8 - 23 mg/dL 16  15  14    Creatinine 0.61 - 1.24 mg/dL 0.45  4.09  8.11   Sodium 135 - 145 mmol/L 134  134  134   Potassium 3.5 - 5.1 mmol/L 3.8  3.5  4.0   Chloride 98 - 111 mmol/L 105  102  104   CO2 22 - 32 mmol/L 25  25  25    Calcium  8.9 - 10.3 mg/dL 8.5  8.0  8.6   Total Protein 6.5 - 8.1 g/dL 5.8  5.1  5.4   Total Bilirubin 0.0 - 1.2 mg/dL 0.3  0.4  0.2   Alkaline Phos 38 - 126 U/L 57  49  46   AST 15 - 41 U/L 13  19  15    ALT 0 - 44 U/L 13  15  13      Lab Results  Component Value Date   MPROTEIN Not Observed 04/28/2021   Lab Results  Component Value Date   KPAFRELGTCHN 33.6 (H) 04/28/2021   LAMBDASER 22.0 04/28/2021   KAPLAMBRATIO  1.53 04/28/2021    RADIOGRAPHIC STUDIES: VAS US  LOWER EXTREMITY VENOUS (DVT) Result Date: 04/22/2023  Lower Venous DVT Study Patient Name:  Hamdan Toscano.  Date of Exam:   04/21/2023 Medical Rec #: [id number]             Accession #:    [id number] Date of Birth: 1955/03/01              Patient Gender: M Patient Age:   28 years Exam Location:  [facility] Procedure:      VAS US  LOWER EXTREMITY VENOUS (DVT) Referring Phys: Baldwin Levee --------------------------------------------------------------------------------  Indications: Stroke.  Risk Factors: Cancer Stage IV lung and prostate cancer with mets to bones, on chemo. Limitations: Edema and Shadowing from arterial calcification. Comparison Study: No prior study on file. Performing Technologist: Ria Chad  Examination Guidelines: A complete evaluation includes B-mode imaging, spectral Doppler, color Doppler, and power Doppler as needed of all accessible portions of each vessel. Bilateral testing is considered an integral part  of a complete examination. Limited examinations for reoccurring indications may be performed as noted. The reflux portion of the exam is performed with the patient in reverse Trendelenburg.  +---------+---------------+---------+-----------+----------+--------------+ RIGHT    CompressibilityPhasicitySpontaneityPropertiesThrombus Aging +---------+---------------+---------+-----------+----------+--------------+ CFV      Full           Yes      Yes                                 +---------+---------------+---------+-----------+----------+--------------+ SFJ      Full           Yes      Yes                                 +---------+---------------+---------+-----------+----------+--------------+ FV Prox  Full                                                        +---------+---------------+---------+-----------+----------+--------------+ FV Mid   Full                                                        +---------+---------------+---------+-----------+----------+--------------+ FV DistalFull                                                        +---------+---------------+---------+-----------+----------+--------------+ PFV      Full                                                        +---------+---------------+---------+-----------+----------+--------------+ POP      Full           Yes      Yes                                 +---------+---------------+---------+-----------+----------+--------------+  PTV      Full                                                        +---------+---------------+---------+-----------+----------+--------------+ PERO     Full                                                        +---------+---------------+---------+-----------+----------+--------------+   +---------+---------------+---------+-----------+----------+--------------+ LEFT     CompressibilityPhasicitySpontaneityPropertiesThrombus Aging  +---------+---------------+---------+-----------+----------+--------------+ CFV      Full           Yes      Yes                                 +---------+---------------+---------+-----------+----------+--------------+ SFJ      Full           Yes      Yes                                 +---------+---------------+---------+-----------+----------+--------------+ FV Prox  Full                                                        +---------+---------------+---------+-----------+----------+--------------+ FV Mid   Full                                                        +---------+---------------+---------+-----------+----------+--------------+ FV DistalFull                                                        +---------+---------------+---------+-----------+----------+--------------+ PFV      Full                                                        +---------+---------------+---------+-----------+----------+--------------+ POP      Full           Yes      Yes                                 +---------+---------------+---------+-----------+----------+--------------+ PTV      Full                                                        +---------+---------------+---------+-----------+----------+--------------+  PERO     Full                                                        +---------+---------------+---------+-----------+----------+--------------+     Summary: RIGHT: - There is no evidence of deep vein thrombosis in the lower extremity.  - No cystic structure found in the popliteal fossa.  LEFT: - There is no evidence of deep vein thrombosis in the lower extremity.  - No cystic structure found in the popliteal fossa.  *See table(s) above for measurements and observations. Electronically signed by Runell Countryman on 04/22/2023 at 3:52:32 PM.    Final    VAS US  CAROTID (at [facility] and [facility] only) Result Date: 04/22/2023 Carotid Arterial Duplex Study  Patient Name:  Matilde Markie.  Date of Exam:   04/21/2023 Medical Rec #: [id number]             Accession #:    [id number] Date of Birth: 11/27/1955              Patient Gender: M Patient Age:   29 years Exam [facility]  [facility] Procedure:      VAS US  CAROTID Referring Phys: Bertell Broach PRATT --------------------------------------------------------------------------------  Indications:       CVA, Carotid artery disease, Speech disturbance and Weakness. Risk Factors:      Hypertension, current smoker. Other Factors:     Stage IV lung and prostate cancer with mets to bones, on                    chemo.                    CT noted approximately 70-80% stenosis in bilateral carotid                    arteries. Comparison Study:  No prior exam. on file. Performing Technologist: Ria Chad  Examination Guidelines: A complete evaluation includes B-mode imaging, spectral Doppler, color Doppler, and power Doppler as needed of all accessible portions of each vessel. Bilateral testing is considered an integral part of a complete examination. Limited examinations for reoccurring indications may be performed as noted.  Right Carotid Findings: +----------+--------+--------+--------+-------------------------+--------+           PSV cm/sEDV cm/sStenosisPlaque Description       Comments +----------+--------+--------+--------+-------------------------+--------+ CCA Prox  78      23                                                +----------+--------+--------+--------+-------------------------+--------+ CCA Mid   60      13              hyperechoic and calcific          +----------+--------+--------+--------+-------------------------+--------+ CCA Distal62      24                                                +----------+--------+--------+--------+-------------------------+--------+ ICA Prox  92      41  heterogenous and calcific          +----------+--------+--------+--------+-------------------------+--------+ ICA Mid   188     82      60-79%  calcific                          +----------+--------+--------+--------+-------------------------+--------+ ICA Distal100     38                                                +----------+--------+--------+--------+-------------------------+--------+ ECA       73      15              hyperechoic and calcific          +----------+--------+--------+--------+-------------------------+--------+ +----------+--------+-------+----------------+-------------------+           PSV cm/sEDV cmsDescribe        Arm Pressure (mmHG) +----------+--------+-------+----------------+-------------------+ Subclavian122     19     Diffused plaque.                    +----------+--------+-------+----------------+-------------------+ +---------+--------+---+--------+--+ VertebralPSV cm/s120EDV cm/s36 +---------+--------+---+--------+--+  Left Carotid Findings: +----------+--------+--------+--------+-------------------------+--------+           PSV cm/sEDV cm/sStenosisPlaque Description       Comments +----------+--------+--------+--------+-------------------------+--------+ CCA Prox                  Occluded                                  +----------+--------+--------+--------+-------------------------+--------+ CCA Mid                   Occluded                                  +----------+--------+--------+--------+-------------------------+--------+ CCA [id number]             >50%                                      +----------+--------+--------+--------+-------------------------+--------+ ICA Prox  109     47      40-59%  heterogenous and calcific         +----------+--------+--------+--------+-------------------------+--------+ ICA Mid   47      28                                                 +----------+--------+--------+--------+-------------------------+--------+ ICA Distal37      22                                                +----------+--------+--------+--------+-------------------------+--------+ ECA       216     102     >50%    heterogenous and calcific         +----------+--------+--------+--------+-------------------------+--------+ +----------+--------+--------+----------------+-------------------+           PSV cm/sEDV cm/sDescribe  Arm Pressure (mmHG) +----------+--------+--------+----------------+-------------------+ Subclavian303     24      Diffused plaque.                    +----------+--------+--------+----------------+-------------------+ +---------+--------+---+--------+--+------------------+ VertebralPSV cm/s125EDV cm/s48Stenotic at origin +---------+--------+---+--------+--+------------------+   Summary: Right Carotid: Velocities in the right ICA are consistent with a 60-79%                stenosis. Left Carotid: Velocities in the left ICA are consistent with a 40-59% stenosis.               The ECA appears >50% stenosed. Vertebrals:  Bilateral vertebral arteries demonstrate antegrade flow. Left:              elevated velocities. Subclavians: Left subclavian artery was stenotic. Normal flow hemodynamics were              seen in the right subclavian artery. *See table(s) above for measurements and observations.  Suggest Peripheral Vascular Consult. Electronically signed by Ardella Beaver MD on 04/22/2023 at 10:50:30 AM.    Final    CT ANGIO HEAD NECK W WO CM Result Date: 04/21/2023 CLINICAL DATA:  Mental status change of unknown cause. Abnormal cerebellar finding on MRI yesterday. EXAM: CT ANGIOGRAPHY HEAD AND NECK WITH AND WITHOUT CONTRAST TECHNIQUE: Multidetector CT imaging of the head and neck was performed using the standard protocol during bolus administration of intravenous contrast. Multiplanar CT image reconstructions and MIPs were  obtained to evaluate the vascular anatomy. Carotid stenosis measurements (when applicable) are obtained utilizing NASCET criteria, using the distal internal carotid diameter as the denominator. RADIATION DOSE REDUCTION: This exam was performed according to the departmental dose-optimization program which includes automated exposure control, adjustment of the mA and/or kV according to patient size and/or use of iterative reconstruction technique. CONTRAST:  75mL OMNIPAQUE  IOHEXOL  350 MG/ML SOLN COMPARISON:  MRI 04/20/2023. Neck CT 04/20/2023. Head CT 04/20/2023. Brain MRI 05/05/2021. FINDINGS: CT HEAD FINDINGS Brain: Brain detail is limited by some technical deficiency. Previously described peripheral left cerebellar lesion is not visible on this exam. No sign of acute stroke, mass, hemorrhage, hydrocephalus or extra-axial collection. 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 Vascular: No abnormal vascular finding. 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 Sinuses/Orbits: Clear/normal Other: None Review of the MIP images confirms the above findings CTA NECK FINDINGS Aortic arch: Aortic atherosclerosis.  Branching pattern is normal. Right carotid system: Common carotid artery shows a 30% stenosis at its origin from the innominate artery. Beyond that, there is scattered plaque but the vessel is patent to the bifurcation. Dense calcified plaque at the carotid bifurcation and ICA bulb. Multiple serial stenoses. In the distal bulb, minimal diameter is 1 mm or less. Compared to a more distal cervical ICA diameter of 5 mm, this indicates an 80% or greater stenosis. Left carotid system: Common carotid artery shows severe stenosis at its origin, 80% or greater. Vessel is occluded 2 cm beyond that in the upper mediastinum. No flow within the left common carotid artery beyond that. Dense calcified plaque  at the carotid bifurcation and ICA bulb. There is reconstituted flow at this level, probably from external to internal collateral vessels. Beyond the atherosclerotic disease at the bifurcation, the ICA is patent 2 in through the skull base as a small vessel, diameter 2.8 mm. Vertebral arteries: No flow limiting proximal subclavian stenosis  on the right. Calcified plaque at the right vertebral artery origin and affecting the proximal vertebral artery extensively with multiple serial stenoses. Greatest stenosis in the mid cervical region is probably a 70-80%. The vessel does show flow to the skull base and foramen magnum level. On the left, there is moderate stenosis of the proximal subclavian artery, estimated at 50%. The left vertebral artery origin is widely patent and the vessel shows some scattered plaque but is patent through the cervical region without stenosis greater than 30%. Skeleton: Ordinary cervical spondylosis. Other neck: No mass or lymphadenopathy. Upper chest: Advanced emphysema and pulmonary scarring. Spiculated lesion in the left upper lobe as evaluated on chest CT yesterday. Please see results of prior chest CT exam. Review of the MIP images confirms the above findings CTA HEAD FINDINGS Anterior circulation: Both internal carotid arteries are patent through the skull base. There is advanced siphon atherosclerotic disease with multiple stenoses on both sides. Serial stenoses are estimated in the range of 50-70%. The anterior and middle cerebral vessels show flow. No large vessel occlusion. Distal vessels show some atherosclerotic irregularity. Posterior circulation: Both vertebral arteries are patent through the foramen magnum to the basilar artery. Venous sinuses: Patent and normal. Anatomic variants: None significant. Review of the MIP images confirms the above findings IMPRESSION: 1. No acute head CT finding. Previously described peripheral left cerebellar lesion is not visible on this exam due  to technical deficiency. 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 2. 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 3. Aortic atherosclerosis. 4. 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 Vessel is occluded 2 cm beyond that in the upper mediastinum. No flow within the left common carotid artery beyond that. Dense calcified plaque at the left carotid bifurcation and ICA bulb. Reconstituted flow at this level, probably from external to internal collateral vessels. Beyond the atherosclerotic disease at the bifurcation, the ICA is patent to the skull base as a small vessel, diameter 2.8 mm. 5. Advanced atherosclerotic disease at the right carotid bifurcation and ICA bulb. Multiple serial stenoses. In the distal bulb, minimal diameter is 1 mm or less. Compared to a more distal cervical ICA diameter of 5 mm, this indicates an 80% or greater stenosis. 6. Extensive atherosclerotic disease of the proximal right vertebral artery with multiple serial stenoses. Greatest stenosis in the mid cervical region is probably a 70-80%. The vessel does show flow to the skull base and foramen magnum level. 7. 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 8. Advanced atherosclerotic disease of both carotid siphons with multiple stenoses on both sides. Serial stenoses are estimated in the range of 50-70%. 9. No acute intracranial large or medium vessel occlusion. The left cerebellar lesion remains indeterminate at this time for a late subacute stroke versus an atypical metastasis. Additionally on the MRI, there is an up to 12 mm newly seen lesion in the region of the left cerebellar flocculus that was not there in May of 2023. Although this is nonenhancing, it is viewed with some suspicion. It could be a newly developed cyst, though I do not know why that would  happen. Therefore, this should also be observed for the possibility of a metastasis. Aortic Atherosclerosis (ICD10-I70.0) and Emphysema (ICD10-J43.9). Electronically Signed   By: Bettylou Brunner M.D.   On: 04/21/2023 13:10   ECHOCARDIOGRAM  COMPLETE Result Date: 04/21/2023    ECHOCARDIOGRAM REPORT   Patient Name:   Dannel Rafter. Date of Exam: 04/21/2023 Medical Rec #:  [id number]            Height:       69.0 in Accession #:    [id number]           Weight:       142.9 lb Date of Birth:  1955/08/19             BSA:          1.791 m Patient Age:    67 years             BP:           149/98 mmHg Patient Gender: M                    HR:           102 bpm. Exam Location:  Inpatient Procedure: 2D Echo, Cardiac Doppler, Color Doppler and Intracardiac            Opacification Agent (Both Spectral and Color Flow Doppler were            utilized during procedure). Indications:    Stroke  History:        Patient has prior history of Echocardiogram examinations. Risk                 Factors:Former Smoker and Hypertension.  Sonographer:    Willey Harrier Referring Phys: [id number] Maisie Scotland PRATT  Sonographer Comments: Suboptimal parasternal window, suboptimal apical window and Technically difficult study due to poor echo windows. Image acquisition challenging due to respiratory motion. IMPRESSIONS  1. Left ventricular ejection fraction, by estimation, is 60 to 65%. The left ventricle has normal function. The left ventricle has no regional wall motion abnormalities. Indeterminate diastolic filling due to E-A fusion.  2. Right ventricular systolic function is normal. The right ventricular size is normal. Tricuspid regurgitation signal is inadequate for assessing PA pressure.  3. The mitral valve is grossly normal. No evidence of mitral valve regurgitation. No evidence of mitral stenosis.  4. The aortic valve is tricuspid. Aortic valve regurgitation is not visualized. No aortic stenosis is present.  5. Aortic dilatation noted. There is  mild dilatation of the aortic root, measuring 41 mm. There is mild dilatation of the ascending aorta, measuring 41 mm.  6. The inferior vena cava is normal in size with greater than 50% respiratory variability, suggesting right atrial pressure of 3 mmHg. Conclusion(s)/Recommendation(s): No intracardiac source of embolism detected on this transthoracic study. Consider a transesophageal echocardiogram to exclude cardiac source of embolism if clinically indicated. FINDINGS  Left Ventricle: Left ventricular ejection fraction, by estimation, is 60 to 65%. The left ventricle has normal function. The left ventricle has no regional wall motion abnormalities. The left ventricular internal cavity size was normal in size. There is  no left ventricular hypertrophy. Indeterminate diastolic filling due to E-A fusion. Right Ventricle: The right ventricular size is normal. No increase in right ventricular wall thickness. Right ventricular systolic function is normal. Tricuspid regurgitation signal is inadequate for assessing PA pressure. Left Atrium: Left atrial size was normal in size. Right Atrium: Right atrial size was not well visualized. Pericardium: There is no evidence of pericardial effusion. Presence of epicardial fat layer. Mitral Valve: The mitral valve is grossly normal. No evidence of mitral valve regurgitation. No evidence of mitral valve stenosis.  MV peak gradient, 3.3 mmHg. The mean mitral valve gradient is 1.0 mmHg. Tricuspid Valve: The tricuspid valve is grossly normal. Tricuspid valve regurgitation is trivial. No evidence of tricuspid stenosis. Aortic Valve: The aortic valve is tricuspid. Aortic valve regurgitation is not visualized. No aortic stenosis is present. Aortic valve peak gradient measures 3.6 mmHg. Pulmonic Valve: The pulmonic valve was grossly normal. Pulmonic valve regurgitation is not visualized. No evidence of pulmonic stenosis. Aorta: Aortic dilatation noted. There is mild dilatation of the aortic  root, measuring 41 mm. There is mild dilatation of the ascending aorta, measuring 41 mm. Venous: The inferior vena cava is normal in size with greater than 50% respiratory variability, suggesting right atrial pressure of 3 mmHg. IAS/Shunts: The interatrial septum was not well visualized.  LEFT VENTRICLE PLAX 2D LVIDd:         4.00 cm   Diastology LVIDs:         2.80 cm   LV e' medial:    5.77 cm/s LV PW:         1.10 cm   LV E/e' medial:  9.4 LV IVS:        1.00 cm   LV e' lateral:   7.07 cm/s LVOT diam:     2.10 cm   LV E/e' lateral: 7.7 LV SV:         49 LV SV Index:   27 LVOT Area:     3.46 cm  IVC IVC diam: 1.50 cm AORTIC VALVE AV Area (Vmax): 2.99 cm AV Vmax:        94.60 cm/s AV Peak Grad:   3.6 mmHg LVOT Vmax:      81.60 cm/s LVOT Vmean:     58.300 cm/s LVOT VTI:       0.141 m  AORTA Ao Root diam: 4.10 cm Ao Asc diam:  4.10 cm MITRAL VALVE MV Area (PHT): 4.71 cm    SHUNTS MV Area VTI:   3.90 cm    Systemic VTI:  0.14 m MV Peak grad:  3.3 mmHg    Systemic Diam: 2.10 cm MV Mean grad:  1.0 mmHg MV Vmax:       0.90 m/s MV Vmean:      57.3 cm/s MV Decel Time: 161 msec MV E velocity: 54.40 cm/s MV A velocity: 90.40 cm/s MV E/A ratio:  0.60 Jackquelyn Mass MD Electronically signed by Jackquelyn Mass MD Signature Date/Time: 04/21/2023/12:32:53 PM    Final    MR Brain W and Wo Contrast Result Date: 04/20/2023 CLINICAL DATA:  Mental status change, unknown cause EXAM: MRI HEAD WITHOUT AND WITH CONTRAST TECHNIQUE: Multiplanar, multiecho pulse sequences of the brain and surrounding structures were obtained without and with intravenous contrast. CONTRAST:  6mL GADAVIST  GADOBUTROL  1 MMOL/ML IV SOLN COMPARISON:  Same day CT head.  MRI head May 4, 23. FINDINGS: Brain: Round, nonenhancing 7 mm focus of mild DWI hyperintensity in the medial left cerebellum (series 5, image 6 and series 8, image 4), which is new from prior. No substantial mass effect. No pathologic enhancement. No definite acute hemorrhage or hydrocephalus.  Vascular: Major arterial flow voids are maintained skull base. Skull and upper cervical spine: Normal marrow signal. Sinuses/Orbits: Clear sinuses.  No acute orbital findings. IMPRESSION: Round, nonenhancing 7 mm focus of mild DWI hyperintensity in the medial left cerebellum, which is new from prior. This finding is indeterminate but may represent a recent or subacute infarct. No associated edema or enhancement to suggest metastasis; however, given the  clinical history, recommend short interval follow-up MRI head with contrast in 4-6 weeks to ensure expected evolution. Electronically Signed   By: Stevenson Elbe M.D.   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 History of COPD. Generalized weakness and slurred speech. Recent change to voice. EXAM: CT ANGIOGRAPHY CHEST WITH CONTRAST TECHNIQUE: Multidetector CT imaging of the chest was performed using the standard protocol during bolus administration of intravenous contrast. Multiplanar CT image reconstructions and MIPs were obtained to evaluate the vascular anatomy. RADIATION DOSE REDUCTION: This exam was performed according to the departmental dose-optimization program which includes automated exposure control, adjustment of the mA and/or kV according to patient size and/or use of iterative reconstruction technique. CONTRAST:  75mL OMNIPAQUE  IOHEXOL  350 MG/ML SOLN COMPARISON:  Same day chest radiograph and CT 02/15/2023 FINDINGS: Cardiovascular: No pericardial effusion. Negative for acute pulmonary embolism. Aortic and coronary artery atherosclerotic calcification. Right chest wall Port-A-Cath. Mediastinum/Nodes: Trachea and esophagus are unremarkable. Similar soft tissue thickening about the inferior left hilum compared to prior and likely representing post treatment change. Lungs/Pleura: Advanced paraseptal and centrilobular emphysema with  bullous change in the left apex. Diffuse bronchial wall thickening. No pleural effusion or pneumothorax. Increased size of the irregular consolidation in the left apex now measuring 2.6 x 2.0 cm. This measured 2.4 x 1.3 cm on CT 02/15/2023 using similar measuring technique. This may represent progression of post treatment change however residual or recurrent malignancy is not excluded. Multiple pulmonary nodules are redemonstrated and are similar in size to 02/15/2023. Interval increase in size of a subpleural anterior right upper lobe nodule measuring 6 mm nodule (13/108) previously measured 4 mm. Multiple new nodules in the left lower lobe measuring up to 6 mm (series 13/image 288). Upper Abdomen: Stable hypoattenuating lesions in the liver likely representing benign cyst. No acute abnormality in the visualized abdomen. Musculoskeletal: No acute fracture. Similar mixed lytic and sclerotic appearance of the left scapula. Review of the MIP images confirms the above findings. IMPRESSION: 1. Negative for acute pulmonary embolism. 2. Increased size of the irregular consolidation in the left apex now measuring 2.6 x 2.0 cm. This measured 2.4 x 1.3 cm on CT 02/15/2023. This may represent progression of post treatment change however residual or recurrent malignancy is not excluded. Recommend close interval follow-up or PET/CT. 3. Multiple new nodules in the left lower lobe measuring up to 6 mm. Interval increase in size of a subpleural anterior right upper lobe nodule measuring 6 mm previously measured 4 mm. Aortic Atherosclerosis (ICD10-I70.0) and Emphysema (ICD10-J43.9). Electronically Signed   By: Rozell Cornet M.D.   On: 04/20/2023 19:41   CT Soft Tissue Neck W Contrast Result Date: 04/20/2023 CLINICAL DATA:  Soft tissue infection suspected, generalized weakness and speech difficulty. History of metastatic prostate/lung cancer. EXAM: CT NECK WITH CONTRAST TECHNIQUE: Multidetector CT imaging of the neck was  performed using the standard protocol following the bolus administration of intravenous contrast. RADIATION DOSE REDUCTION: This exam was performed according to the departmental dose-optimization program which includes automated exposure control, adjustment of the mA and/or kV according to patient size and/or use of iterative reconstruction technique. CONTRAST:  75mL OMNIPAQUE  IOHEXOL  350 MG/ML SOLN COMPARISON:  None Available. FINDINGS: Pharynx and larynx: Symmetric appearance of the nasopharynx. The palatine tonsils are relatively symmetric. Normal appearance of the oral cavity and floor of mouth. The base of tongue is unremarkable. The  epiglottis is normal. No retropharyngeal effusion. There is no evidence of fluid collection or peritonsillar abscess. Symmetric appearance of the aryepiglottic folds and piriform sinuses. Normal appearance of the paraglottic fat. The vocal folds are symmetric. Salivary glands: There is slightly asymmetric hyperattenuation of the right parotid gland which may be artifactual. The parotid glands are relatively symmetric in size. There is no inflammatory stranding adjacent to the right parotid gland. No ductal enlargement or evidence of abnormal calcification. The submandibular glands are symmetric. Thyroid : Normal. Lymph nodes: There are no enlarged lymph nodes in the neck. There is a 1.5 x 0.9 x 0.8 cm cystic appearing focus within the right posterior neck involving cervical level 2 B/5 which demonstrates internal fluid density. No definite associated soft tissue component. Vascular: Moderate atherosclerosis of the visualized aortic arch. There is prominent atherosclerosis involving the origin of the left subclavian artery resulting in at least moderate stenosis. Additional prominent atherosclerosis of distal left subclavian/proximal left axillary artery. Prominent calcified atherosclerosis involving the bilateral carotid bifurcations. There is likely severe stenosis at the origins  of both internal carotid arteries. Additional atherosclerosis of the partially visualized carotid siphons. Right IJ approach central venous catheter noted. Limited intracranial: Negative. Visualized orbits: Negative. Mastoids and visualized paranasal sinuses: Mastoid air cells are clear. Mucosal thickening in the alveolar recesses of the maxillary sinuses. Skeleton: No acute or aggressive finding noted. Degenerative changes throughout the cervical spine. Upper chest: Emphysema. Scarring in the bilateral lung apices and in the left upper lobe. See separately dictated CTA chest for complete evaluation of intrathoracic findings. Other: None. IMPRESSION: No acute abnormality along the aero digestive structures in the neck. No evidence of abscess. Slightly asymmetric attenuation of the right parotid gland which may be artifactual. No adjacent inflammatory stranding. Recommend correlation with tenderness over the right parotid. 1.5 cm cystic focus in the right posterior neck may reflect a lymphangioma/lymphatic malformation. There is no soft tissue component to suggest necrotic lymph node. Recommend short interval follow-up with CT or ultrasound of the neck. Atherosclerosis as above. 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 Consider CTA for further evaluation if clinically indicated. Electronically Signed   By: Denny Flack M.D.   On: 04/20/2023 18:26   DG Chest 2 View Result Date: 04/20/2023 CLINICAL DATA:  Cough and shortness of breath EXAM: CHEST - 2 VIEW COMPARISON:  X-ray 04/08/2023 and older FINDINGS: Stable right IJ chest port with tip along the central SVC. Hyperinflation with chronic lung changes. No pneumothorax, effusion or edema. Normal cardiopericardial silhouette. Calcified aorta. Overlapping cardiac leads. Apical pleural thickening. Degenerative changes along the spine. IMPRESSION: Hyperinflation with chronic lung changes. Calcified aorta. Right IJ chest port. Electronically Signed    By: Adrianna Horde M.D.   On: 04/20/2023 12:43   CT HEAD WO CONTRAST Result Date: 04/20/2023 CLINICAL DATA:  Provided history: Mental status change, unknown cause. Additional history provided: Stroke-like symptoms. History of lung adenocarcinoma and prostate cancer. EXAM: CT HEAD WITHOUT CONTRAST TECHNIQUE: Contiguous axial images were obtained from the base of the skull through the vertex without intravenous contrast. RADIATION DOSE REDUCTION: This exam was performed according to the departmental dose-optimization program which includes automated exposure control, adjustment of the mA and/or kV according to patient size and/or use of iterative reconstruction technique. COMPARISON:  Brain MRI 05/05/2021. FINDINGS: Brain: Generalized cerebral atrophy. 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 Patchy and ill-defined hypoattenuation within the cerebral white matter, nonspecific but compatible with mild chronic  small vessel ischemic disease. There is no acute intracranial hemorrhage. No demarcated cortical infarct. No extra-axial fluid collection. No evidence of an intracranial mass. No midline shift. Vascular: No hyperdense vessel.  Atherosclerotic calcifications. Skull: No calvarial fracture. 9 x 3 mm osseous protrusion projecting outward from the right frontal calvarium most consistent with an osteoma. 12 mm lucent focus within the left parietal calvarium, extending to the inner table (series 4, image 32) (series 6, image 45). Sinuses/Orbits: No mass or acute finding within the imaged orbits. 6 mm left frontoethmoidal osteoma. Trace mucosal thickening scattered within the paranasal sinuses at the imaged levels. IMPRESSION: 1. Please note, assessment for intracranial metastatic disease is limited on this non-contrast head CT. 2. No evidence of an acute intracranial abnormality. 3. 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 4. Mild cerebral white matter  chronic small vessel ischemic disease. 5. Generalized cerebral atrophy. 6. Nonspecific 12 mm lucent focus within the left parietal calvarium. An osseous metastasis cannot be excluded. Consider a brain MRI (with and without contrast) for further evaluation. Electronically Signed   By: Bascom Lily D.O.   On: 04/20/2023 11:57   DG Chest Portable 1 View Result Date: 04/08/2023 CLINICAL DATA:  Altered mental status.  Shortness of breath. EXAM: PORTABLE CHEST 1 VIEW COMPARISON:  Radiograph 03/22/2023, CT 02/15/2023 FINDINGS: Right chest port remains in place. Advanced emphysema. Stable radiographic appearance of left upper post treatment related change. No evidence of acute airspace disease. No pneumothorax or large pleural effusion. No pulmonary edema. Stable heart size and mediastinal contours. Right costophrenic angle is excluded from the field of view. IMPRESSION: 1. Advanced emphysema. No acute findings. 2. Stable post treatment related change in the left upper lobe. Electronically Signed   By: Chadwick Colonel M.D.   On: 04/08/2023 21:48    ASSESSMENT & PLAN Zeddie Njie. Is a 68 y.o.  male with medical history significant for metastatic adenocarcinoma of the lung who presents for a follow up visit.  # Metastatic Adenocarcinoma of the Lung  -- NGS testing from this patient shows no evidence of targetable mutation. --MRI brain shows no evidence of intracranial metastases --Started carboplatin , pembrolizumab , and pemetrexed  as treatment for his cancer on 07/08/2021. --Received palliative radiation to osseous metastasis of the left shoulder from 08/11/2021-08/24/2021. Planned dose is 30 Gy in 10 fx.  --Discontinued Pemetrexed  on 03/17/2022 due to persistent anemia and kidney dysfunction. --Restaging CT CAP from 08/09/2022 shows interval enlargement of multiple mixed lytic and sclerotic bone metastases involving the right pelvis. Other bone metastases including of the left scapula, sacrum, and left  hemipelvis are unchanged. Unchanged treated central LUL mass. Recommend to continue on pembrolizumab  therapy and arrange for palliative radiation to enlarging bone metastases.  Plan: --labs from today were reviewed. Labs show white blood cell count 14.9, Hgb 8.6, MCV 86.4, Plt 324. Creatinine and LFTs normal.  --Reviewed CTA chest from recent hospitalization that shows progression of disease to the consolidation in the left apex.  --Dr. Rosaline Page recommends changing therapies to single agent Gemcitabine  3 weeks on, 1 week off.  --Plan to start with in a week pending insurance authorization.  --RTC in 2 weeks for labs and follow up prior to Cycle 1, Day 8.   #Leukocytosis-neutrophil predominant: --Patient denies any infectious symptoms, elevation is likely secondary to growth factor --Held GCSF injection with Cycle 4 of Ram/Docetaxel . Continue to hold injection for future cycles.   #Left lower eyelid stye--resolved: --Applying warm compresses, recommend to continue --Completed 7  day course of doxycycline  100 mg BID.   #Left sided low back pain with sciatica: --History of lumbar spondylosis and moderate to severe facet arthrosis without spinal stenosis and mild right neural foraminal stenosis at L4-5 seen on MRI lumbar 04/24/2020.   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  --Pain is slightly improved but still present.  --Continue to take tramadol  q 6 hours for pain control.   #Hyponatremia-improved --Chronic in nature --Sodium level dropped from 127 on 08/09/2022 to 118.  Today sodium stable to 134.  --Etiologies including SIADH, immunotherapy induced, too much  water intake.  --Patient is asymptomatic without nausea/vomiting, headaches, confusion, seizures, etc --Currently on salt tablets 2 gm PO daily. We advised to titrate to twice daily x 7 days and then three times daily.  --Strict ED precautions given for any new symptoms as discussed above.   # Iron Deficiency Anemia #Chemotherapy induced anemia --Currently on ferrous sulfate  325 mg PO daily --Hgb is 8.6 today. Will repeat iron labs this week.   #Pain Control --stopped oxycodone  due to itching --currently following with Palliative care.  --pain well controlled with tramadol  as needed. Continue to take tramdol 50-100 mg q 6 hours as needed. --Continue senna docusate and miralax  for constipation prophylaxis --Continue to monitor  #Supportive Care -- chemotherapy education complete -- port placed -- zofran  8mg  q8H PRN and compazine  10mg  PO q6H for nausea -- EMLA  cream for port -- Patient referred to palliative care for pain control. Pain control as above.    No orders of the defined types were placed in this encounter.   All questions were answered. The patient knows to call the clinic with any problems, questions or concerns.  I have spent a total of 30 minutes minutes of face-to-face and non-face-to-face time, preparing to see the patient, performing a medically appropriate examination, counseling and educating the patient, referring and communicating with other health care professionals, documenting clinical information in the electronic health record, and care coordination.   Wyline Hearing PA-C Dept of Hematology and Oncology [facility] at [facility] Phone: [phone]     04/25/2023 1:03 PM

## 2023-04-25 NOTE — Progress Notes (Addendum)
 Palliative Medicine [facility]  [phone] [phone]   Name: Terry Page. Date: 04/25/2023 MRN: [id number]  DOB: [birth year]  Terry Page Care Team: Dorena Gander, MD as PCP - General (Family Medicine)    INTERVAL HISTORY: Terry Page. is a 68 y.o. 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 Terry Page is currently undergoing chemotherapy. Palliative ask to see for symptom management.   SOCIAL HISTORY:     reports that Terry Page quit smoking about 3 years ago. His smoking use included cigarettes. Terry Page has never used smokeless tobacco. Terry Page reports current alcohol use of about 12.0 standard drinks of alcohol per week. Terry Page reports that Terry Page does not use drugs.  ADVANCE DIRECTIVES:  None on file   CODE STATUS: Full code  PAST MEDICAL HISTORY: Past Medical History:  Diagnosis Date   Alcohol abuse    Asthma    Bullous emphysema ([name])    Essential hypertension 04/10/2020   Hemorrhoids    History of radiation therapy 04/08/20-04/19/20   IMRT- Left lung- Dr. Retta Caster   Incidental lung nodule, greater than or equal to 8mm 10/22/2017   Left upper lobe - discovered on CTA   Prostate cancer ([name])    Spontaneous pneumothorax 10/20/2017   right   Tobacco abuse     ALLERGIES:  is allergic to morphine  and oxycodone .  MEDICATIONS:  Current Outpatient Medications  Medication Sig Dispense Refill   acetaminophen  (TYLENOL ) 325 MG tablet Take 2 tablets (650 mg total) by mouth every 6 (six) hours as needed for mild pain or headache (fever >/= 101).     albuterol  (VENTOLIN  HFA) 108 (90 Base) MCG/ACT inhaler Inhale 2 puffs into the lungs every 6 (six) hours as needed for wheezing or shortness of breath.     [Paused] amLODipine  (NORVASC ) 10 MG tablet Take 1 tablet by mouth daily. (Terry Page not taking: Reported  on 04/20/2023)     aspirin  EC 325 MG tablet Take 1 tablet (325 mg total) by mouth daily. 30 tablet 1   atorvastatin  (LIPITOR) 40 MG tablet Take 1 tablet (40 mg total) by mouth daily. 30 tablet 1   cyanocobalamin  (VITAMIN B12) 1000 MCG tablet Take 1 tablet (1,000 mcg total) by mouth daily. (Terry Page taking differently: Take 1,000 mcg by mouth as needed (When directed by physican).) 90 tablet 1   dronabinol  (MARINOL ) 5 MG capsule Take 1 capsule (5 mg total) by mouth 2 (two) times daily before lunch and supper. 60 capsule 0   ferrous sulfate  325 (65 FE) MG EC tablet Take 1 tablet (325 mg total) by mouth daily with breakfast. (Terry Page not taking: Reported on 04/20/2023) 30 tablet 3   folic acid  (FOLVITE ) 1 MG tablet Take 1 tablet (1 mg total) by mouth daily. 90 tablet 3   hydrOXYzine  (ATARAX ) 25 MG tablet Take 1 tablet (25 mg total) by mouth at bedtime as needed for itching. 30 tablet 11   ipratropium-albuterol  (DUONEB) 0.5-2.5 (3) MG/3ML SOLN Take 3 mLs by nebulization every 6 (six) hours as needed. (Terry Page taking differently: Take 3 mLs by nebulization every 6 (six) hours as needed (shortness of breathing or wheezing).) 360 mL 0   lidocaine  (LIDODERM ) 5 % Place 1 patch onto the skin daily. Remove & Discard patch within 12 hours or as directed by MD. Apply to low back 30 patch 0   lidocaine -prilocaine  (EMLA )  cream Apply 1 Application topically as needed. 30 g 0   [Paused] olmesartan (BENICAR) 40 MG tablet Take 40 mg by mouth daily.     polyethylene glycol (MIRALAX ) 17 g packet Take 17 g by mouth daily. (Terry Page not taking: Reported on 04/20/2023) 30 each 2   potassium chloride  SA (KLOR-CON  M) 20 MEQ tablet TAKE 1 TABLET(20 MEQ) BY MOUTH DAILY 90 tablet 0   SENNA S 8.6-50 MG tablet Take 2 tablets by mouth at bedtime as needed for mild constipation or moderate constipation.     senna-docusate (STIMULANT LAXATIVE) 8.6-50 MG tablet Take 2 tablets by mouth 2 (two) times daily. May add additional tablet as needed  150 tablet 3   sodium chloride  1 g tablet Take 1 tablet (1 g total) by mouth 3 (three) times daily with meals. 90 tablet 3   thiamine  100 MG tablet Take 1 tablet (100 mg total) by mouth daily. (Terry Page not taking: Reported on 04/20/2023)     traMADol  (ULTRAM ) 50 MG tablet Take 1-2 tablets (50-100 mg total) by mouth every 6 (six) hours as needed. 120 tablet 0   No current facility-administered medications for this visit.    VITAL SIGNS: BP (!) 148/87 (BP Location: Right Arm, Terry Page Position: Sitting)   Pulse (!) 108   Temp (!) 97.4 F (36.3 C) (Tympanic)   Resp 17   Ht 5\' 9"  (1.753 m)   Wt 152 lb 8 oz (69.2 kg)   BMI 22.52 kg/m  Filed Weights   04/25/23 1054  Weight: 152 lb 8 oz (69.2 kg)    Estimated body mass index is 22.52 kg/m as calculated from the following:   Height as of this encounter: 5\' 9"  (1.753 m).   Weight as of this encounter: 152 lb 8 oz (69.2 kg).   PERFORMANCE STATUS (ECOG) : 1 - Symptomatic but completely ambulatory  Physical Exam General: Fatigue  Cardiovascular: Regular rate and rhythm Pulmonary: Diminished bilaterally Extremities: no edema, no joint deformities, weakness Skin: no rashes, dry  Neurological: AAO x3  IMPRESSION: Discussed the use of AI scribe software for clinical note transcription with the Terry Page, who gave verbal consent to proceed.  History of Present Illness  Terry Visconti. is a 68 year old male with metastatic lung cancer with bone involvement who presents for follow-up after a recent hospital discharge.  Terry Page admitted to the hospital for several days due to concerns of strokelike symptoms.  Terry Page reports feeling much better since hospitalization.  Continues to endorse fatigue but with noticeable improvement.  Denies concerns of nausea, vomiting, constipation, or diarrhea.  Terry Page feels strong and has a good appetite, noting a weight increase from 142 pounds to 152 pounds since Friday. His pain remains consistent, rated at 6 out  of 10, with variability in intensity on different days.  Feels his pain is well-controlled with use of tramadol  50-100 mg as needed in addition to lidocaine  patches.  No adjustments to current regimen.  All questions answered and support provided.  I discussed the importance of continued conversation with family and their medical providers regarding overall plan of care and treatment options, ensuring decisions are within the context of the patients values and GOCs. Assessment & Plan Cancer related pain/Back pain Reports pain is well controlled. No adjustments at this time.  - Tramadol  50-100mg  every 6 hours as needed -Lidocaine  patch to back as needed.   Allergic symptoms Symptoms consistent with allergies, exacerbated despite current antihistamine use. - Continue current antihistamine regimen.  I will plan to see Terry Page back in 3-4 weeks. Sooner if needed.    Terry Page expressed understanding and was in agreement with this plan. Terry Page also understands that Terry Page can call the clinic at any time with any questions, concerns, or complaints.   Any controlled substances utilized were prescribed in the context of palliative care. PDMP has been reviewed.   Visit consisted of counseling and education dealing with the complex and emotionally intense issues of symptom management and palliative care in the setting of serious and potentially life-threatening illness.  Dellia Ferguson, AGPCNP-BC  Palliative Medicine Team/[facility]

## 2023-04-25 NOTE — Progress Notes (Signed)
 DISCONTINUE OFF PATHWAY REGIMEN - Non-Small Cell Lung   [id number]  75 mg/m2 IV D1 + Ramucirumab  10 mg/kg IV D1 q21 Days:   A cycle is every 21 days:     Ramucirumab       Docetaxel    **Always confirm dose/schedule in your pharmacy ordering system**  PRIOR TREATMENT: Off Pathway: Docetaxel  75 mg/m2 IV D1 + Ramucirumab  10 mg/kg IV D1 q21 Days  START OFF PATHWAY REGIMEN - Non-Small Cell Lung   [id number]:Gemcitabine  1,000 mg/m2 IV D1,8,15 q28 Days:   A cycle is every 28 days:     Gemcitabine    **Always confirm dose/schedule in your pharmacy ordering system**  Patient Characteristics: Stage IV Metastatic, Nonsquamous, Molecular Analysis Completed, Molecular Alteration Present and Targeted Therapy Exhausted OR KRAS G12C+ or HER2+ or NRG1+ Present and No Prior Chemo/Immunotherapy OR No Alteration Present, Third Line -  Chemotherapy/Immunotherapy, PS = 2 Therapeutic Status: Stage IV Metastatic Histology: Nonsquamous Cell Broad Molecular Profiling Status: Animal nutritionist Analysis Results: No Alteration Present ECOG Performance Status: 2 Chemotherapy/Immunotherapy Line of Therapy: Third Line Chemotherapy/Immunotherapy Intent of Therapy: Non-Curative / Palliative Intent, Discussed with Patient

## 2023-04-26 LAB — T4: T4, Total: 5.6 ug/dL (ref 4.5–12.0)

## 2023-04-27 ENCOUNTER — Inpatient Hospital Stay

## 2023-04-27 ENCOUNTER — Encounter: Payer: Self-pay | Admitting: Neurology

## 2023-04-27 VITALS — BP 132/87 | HR 110 | Temp 97.8°F | Resp 18 | Wt 149.0 lb

## 2023-04-27 DIAGNOSIS — C3412 Malignant neoplasm of upper lobe, left bronchus or lung: Secondary | ICD-10-CM

## 2023-04-27 DIAGNOSIS — Z5111 Encounter for antineoplastic chemotherapy: Secondary | ICD-10-CM | POA: Diagnosis not present

## 2023-04-27 MED ORDER — PROCHLORPERAZINE MALEATE 10 MG PO TABS
10.0000 mg | ORAL_TABLET | Freq: Once | ORAL | Status: AC
  Administered 2023-04-27: 10 mg via ORAL
  Filled 2023-04-27: qty 1

## 2023-04-27 MED ORDER — HEPARIN SOD (PORK) LOCK FLUSH 100 UNIT/ML IV SOLN
500.0000 [IU] | Freq: Once | INTRAVENOUS | Status: AC | PRN
  Administered 2023-04-27: 500 [IU]

## 2023-04-27 MED ORDER — GEMCITABINE HCL CHEMO INJECTION 1 GM/26.3ML
1000.0000 mg/m2 | Freq: Once | INTRAVENOUS | Status: AC
  Administered 2023-04-27: 1824 mg via INTRAVENOUS
  Filled 2023-04-27: qty 47.97

## 2023-04-27 MED ORDER — SODIUM CHLORIDE 0.9% FLUSH
10.0000 mL | INTRAVENOUS | Status: DC | PRN
Start: 2023-04-27 — End: 2023-04-27
  Administered 2023-04-27: 10 mL

## 2023-04-27 MED ORDER — SODIUM CHLORIDE 0.9 % IV SOLN: INTRAVENOUS | Status: DC

## 2023-04-27 NOTE — Progress Notes (Signed)
 Ok to proceed with tx with HR 110 per Wyline Hearing, PA.  Lou Loewe, PharmD, MBA

## 2023-04-27 NOTE — Patient Instructions (Signed)
 [facility] - A DEPT OF [facility]  Discharge Instructions: Thank you for choosing [facility] to provide your oncology and hematology care.   If you have a lab appointment with the Cancer Center, please go directly to the Cancer Center and check in at the registration area.   Wear comfortable clothing and clothing appropriate for easy access to any Portacath or PICC line.   We strive to give you quality time with your provider. You may need to reschedule your appointment if you arrive late (15 or more minutes).  Arriving late affects you and other patients whose appointments are after yours.  Also, if you miss three or more appointments without notifying the office, you may be dismissed from the clinic at the provider's discretion.      For prescription refill requests, have your pharmacy contact our office and allow 72 hours for refills to be completed.    Today you received the following chemotherapy and/or immunotherapy agents Gemzar      To help prevent nausea and vomiting after your treatment, we encourage you to take your nausea medication as directed.  BELOW ARE SYMPTOMS THAT SHOULD BE REPORTED IMMEDIATELY: *FEVER GREATER THAN 100.4 F (38 C) OR HIGHER *CHILLS OR SWEATING *NAUSEA AND VOMITING THAT IS NOT CONTROLLED WITH YOUR NAUSEA MEDICATION *UNUSUAL SHORTNESS OF BREATH *UNUSUAL BRUISING OR BLEEDING *URINARY PROBLEMS (pain or burning when urinating, or frequent urination) *BOWEL PROBLEMS (unusual diarrhea, constipation, pain near the anus) TENDERNESS IN MOUTH AND THROAT WITH OR WITHOUT PRESENCE OF ULCERS (sore throat, sores in mouth, or a toothache) UNUSUAL RASH, SWELLING OR PAIN  UNUSUAL VAGINAL DISCHARGE OR ITCHING   Items with * indicate a potential emergency and should be followed up as soon as possible or go to the Emergency Department if any problems should occur.  Please show the CHEMOTHERAPY ALERT CARD or IMMUNOTHERAPY  ALERT CARD at check-in to the Emergency Department and triage nurse.  Should you have questions after your visit or need to cancel or reschedule your appointment, please contact [facility] - A DEPT OF [facility]  Dept: [phone]  and follow the prompts.  Office hours are 8:00 a.m. to 4:30 p.m. Monday - Friday. Please note that voicemails left after 4:00 p.m. may not be returned until the following business day.  We are closed weekends and major holidays. You have access to a nurse at all times for urgent questions. Please call the main number to the clinic Dept: [phone] and follow the prompts.   For any non-urgent questions, you may also contact your provider using MyChart. We now offer e-Visits for anyone 54 and older to request care online for non-urgent symptoms. For details visit [email or web address].   Also download the MyChart app! Go to the app store, search "MyChart", open the app, select [place], and log in with your MyChart username and password.

## 2023-04-30 ENCOUNTER — Other Ambulatory Visit (HOSPITAL_COMMUNITY): Payer: Self-pay

## 2023-04-30 ENCOUNTER — Telehealth: Payer: Self-pay

## 2023-04-30 NOTE — Telephone Encounter (Signed)
 [name]. states that he is doing fine. He is eating, drinking, and urinating well. He experienced some fatigue over the weekend which has improved today. He knows to call the office at [phone] if  he has any questions or concerns.

## 2023-04-30 NOTE — Telephone Encounter (Signed)
-----   Message from Nurse Cortney P sent at 04/27/2023 10:27 AM EDT ----- Regarding: Dr Rosaline Coma first time Gemzar  Dr Rosaline Coma patient, first time Gemzar . Patient tolerated tx well , no incidents

## 2023-05-01 ENCOUNTER — Ambulatory Visit: Admitting: Podiatry

## 2023-05-01 ENCOUNTER — Encounter: Payer: Self-pay | Admitting: Podiatry

## 2023-05-01 DIAGNOSIS — I739 Peripheral vascular disease, unspecified: Secondary | ICD-10-CM | POA: Diagnosis not present

## 2023-05-01 DIAGNOSIS — L97411 Non-pressure chronic ulcer of right heel and midfoot limited to breakdown of skin: Secondary | ICD-10-CM | POA: Diagnosis not present

## 2023-05-01 DIAGNOSIS — L97421 Non-pressure chronic ulcer of left heel and midfoot limited to breakdown of skin: Secondary | ICD-10-CM

## 2023-05-01 DIAGNOSIS — B351 Tinea unguium: Secondary | ICD-10-CM

## 2023-05-01 DIAGNOSIS — M79674 Pain in right toe(s): Secondary | ICD-10-CM

## 2023-05-01 DIAGNOSIS — M79675 Pain in left toe(s): Secondary | ICD-10-CM | POA: Diagnosis not present

## 2023-05-01 NOTE — Progress Notes (Signed)
  Subjective:  Patient ID: Terry Drier., male    DOB: 11-21-1955,  MRN: [id number]  Chief Complaint  Patient presents with   Nail Problem    RFC    68 y.o. male presents with the above complaint. History confirmed with patient.  Patient is originally here today to see Dr. Zettie Hillock for thickened painful elongated mycotic nails he sees him for routine regular debridement which has been helpful he has a new sores on both heels Objective:  Physical Exam: warm, good capillary refill, DP reduced bilateral, nail exam onychomycosis of the toenails, PT reduced bilateral, and ulceration at bilateral plantar heel posterior, limited to breakdown of skin with exposed subcutaneous tissue no major hyperkeratosis slough fibrosis or exposed bone tendon or joint.           Assessment:   1. Ulcer of left heel and midfoot, limited to breakdown of skin ([name])   2. Ulcer of heel, right, limited to breakdown of skin ([name])   3. Pain due to onychomycosis of toenails of both feet   4. PAD (peripheral artery disease) ([name])      Plan:  Patient was evaluated and treated and all questions answered.  Discussed the etiology and treatment options for the condition in detail with the patient. Recommended debridement of the nails today. Sharp and mechanical debridement performed of all painful and mycotic nails today. Nails debrided in length and thickness using a nail nipper to level of comfort. Discussed treatment options including appropriate shoe gear. Follow up as needed for painful nails.  Discussed etiology and treatment options of his wounds which appear to be pressure related injuries I recommended offloading with heel protector boots do not have any these in stock today and I recommended he purchase these from [employer] and information was provided for this also recommended noninvasive vascular testing and order was placed and referral information was given.  He has been dressing with Xeroform and Mepilex  heel pads, continue this at home for now Return in about 4 weeks (around 05/29/2023) for wound check.

## 2023-05-01 NOTE — Patient Instructions (Addendum)
 Call to schedule your circulation testing: [facility] Jeralene Mom. [facility] [address] [place],  [place]  [address]  Main: [phone]   Purchase these to use at home in bed and at night:

## 2023-05-02 ENCOUNTER — Ambulatory Visit

## 2023-05-03 ENCOUNTER — Other Ambulatory Visit (HOSPITAL_COMMUNITY): Payer: Self-pay

## 2023-05-03 ENCOUNTER — Encounter: Payer: Self-pay | Admitting: Hematology and Oncology

## 2023-05-04 ENCOUNTER — Inpatient Hospital Stay: Attending: Hematology and Oncology

## 2023-05-04 ENCOUNTER — Other Ambulatory Visit (HOSPITAL_COMMUNITY): Payer: Self-pay

## 2023-05-04 ENCOUNTER — Inpatient Hospital Stay (HOSPITAL_BASED_OUTPATIENT_CLINIC_OR_DEPARTMENT_OTHER): Admitting: Physician Assistant

## 2023-05-04 ENCOUNTER — Other Ambulatory Visit: Payer: Self-pay

## 2023-05-04 ENCOUNTER — Inpatient Hospital Stay

## 2023-05-04 VITALS — BP 124/79 | HR 100 | Temp 97.9°F | Resp 16 | Ht 69.0 in | Wt 149.9 lb

## 2023-05-04 DIAGNOSIS — Z79899 Other long term (current) drug therapy: Secondary | ICD-10-CM | POA: Insufficient documentation

## 2023-05-04 DIAGNOSIS — R0602 Shortness of breath: Secondary | ICD-10-CM | POA: Insufficient documentation

## 2023-05-04 DIAGNOSIS — C61 Malignant neoplasm of prostate: Secondary | ICD-10-CM | POA: Insufficient documentation

## 2023-05-04 DIAGNOSIS — Z79891 Long term (current) use of opiate analgesic: Secondary | ICD-10-CM | POA: Insufficient documentation

## 2023-05-04 DIAGNOSIS — D649 Anemia, unspecified: Secondary | ICD-10-CM

## 2023-05-04 DIAGNOSIS — Z5111 Encounter for antineoplastic chemotherapy: Secondary | ICD-10-CM | POA: Insufficient documentation

## 2023-05-04 DIAGNOSIS — C3412 Malignant neoplasm of upper lobe, left bronchus or lung: Secondary | ICD-10-CM | POA: Diagnosis present

## 2023-05-04 DIAGNOSIS — I10 Essential (primary) hypertension: Secondary | ICD-10-CM | POA: Insufficient documentation

## 2023-05-04 DIAGNOSIS — D72829 Elevated white blood cell count, unspecified: Secondary | ICD-10-CM | POA: Insufficient documentation

## 2023-05-04 DIAGNOSIS — C7951 Secondary malignant neoplasm of bone: Secondary | ICD-10-CM | POA: Insufficient documentation

## 2023-05-04 DIAGNOSIS — E871 Hypo-osmolality and hyponatremia: Secondary | ICD-10-CM | POA: Insufficient documentation

## 2023-05-04 DIAGNOSIS — Z87891 Personal history of nicotine dependence: Secondary | ICD-10-CM | POA: Diagnosis not present

## 2023-05-04 DIAGNOSIS — Z515 Encounter for palliative care: Secondary | ICD-10-CM | POA: Insufficient documentation

## 2023-05-04 DIAGNOSIS — D509 Iron deficiency anemia, unspecified: Secondary | ICD-10-CM | POA: Insufficient documentation

## 2023-05-04 DIAGNOSIS — Z95828 Presence of other vascular implants and grafts: Secondary | ICD-10-CM

## 2023-05-04 DIAGNOSIS — L97409 Non-pressure chronic ulcer of unspecified heel and midfoot with unspecified severity: Secondary | ICD-10-CM | POA: Insufficient documentation

## 2023-05-04 DIAGNOSIS — T451X5D Adverse effect of antineoplastic and immunosuppressive drugs, subsequent encounter: Secondary | ICD-10-CM | POA: Diagnosis not present

## 2023-05-04 DIAGNOSIS — M7989 Other specified soft tissue disorders: Secondary | ICD-10-CM | POA: Diagnosis not present

## 2023-05-04 DIAGNOSIS — G893 Neoplasm related pain (acute) (chronic): Secondary | ICD-10-CM | POA: Insufficient documentation

## 2023-05-04 DIAGNOSIS — C3490 Malignant neoplasm of unspecified part of unspecified bronchus or lung: Secondary | ICD-10-CM | POA: Diagnosis not present

## 2023-05-04 DIAGNOSIS — D6481 Anemia due to antineoplastic chemotherapy: Secondary | ICD-10-CM | POA: Diagnosis not present

## 2023-05-04 LAB — CMP (CANCER CENTER ONLY)
ALT: 15 U/L (ref 0–44)
AST: 14 U/L — ABNORMAL LOW (ref 15–41)
Albumin: 3.4 g/dL — ABNORMAL LOW (ref 3.5–5.0)
Alkaline Phosphatase: 59 U/L (ref 38–126)
Anion gap: 7 (ref 5–15)
BUN: 12 mg/dL (ref 8–23)
CO2: 25 mmol/L (ref 22–32)
Calcium: 9.2 mg/dL (ref 8.9–10.3)
Chloride: 100 mmol/L (ref 98–111)
Creatinine: 0.74 mg/dL (ref 0.61–1.24)
GFR, Estimated: >60 mL/min (ref >60)
Glucose, Bld: 127 mg/dL — ABNORMAL HIGH (ref 70–99)
Potassium: 4.4 mmol/L (ref 3.5–5.1)
Sodium: 132 mmol/L — ABNORMAL LOW (ref 135–145)
Total Bilirubin: 0.3 mg/dL (ref 0.0–1.2)
Total Protein: 6.7 g/dL (ref 6.5–8.1)

## 2023-05-04 LAB — CBC WITH DIFFERENTIAL (CANCER CENTER ONLY)
Abs Immature Granulocytes: 0.04 10*3/uL (ref 0.00–0.07)
Basophils Absolute: 0 10*3/uL (ref 0.0–0.1)
Basophils Relative: 0 %
Eosinophils Absolute: 0 10*3/uL (ref 0.0–0.5)
Eosinophils Relative: 0 %
HCT: 27.1 % — ABNORMAL LOW (ref 39.0–52.0)
Hemoglobin: 8.9 g/dL — ABNORMAL LOW (ref 13.0–17.0)
Immature Granulocytes: 1 %
Lymphocytes Relative: 9 %
Lymphs Abs: 0.7 10*3/uL (ref 0.7–4.0)
MCH: 28.8 pg (ref 26.0–34.0)
MCHC: 32.8 g/dL (ref 30.0–36.0)
MCV: 87.7 fL (ref 80.0–100.0)
Monocytes Absolute: 0.2 10*3/uL (ref 0.1–1.0)
Monocytes Relative: 3 %
Neutro Abs: 7 10*3/uL (ref 1.7–7.7)
Neutrophils Relative %: 87 %
Platelet Count: 227 10*3/uL (ref 150–400)
RBC: 3.09 MIL/uL — ABNORMAL LOW (ref 4.22–5.81)
RDW: 23.5 % — ABNORMAL HIGH (ref 11.5–15.5)
WBC Count: 7.9 10*3/uL (ref 4.0–10.5)
nRBC: 0.3 % — ABNORMAL HIGH (ref 0.0–0.2)

## 2023-05-04 LAB — SAMPLE TO BLOOD BANK

## 2023-05-04 MED ORDER — SODIUM CHLORIDE 0.9% FLUSH
10.0000 mL | Freq: Once | INTRAVENOUS | Status: AC
  Administered 2023-05-04: 10 mL

## 2023-05-04 MED ORDER — PROCHLORPERAZINE MALEATE 10 MG PO TABS
10.0000 mg | ORAL_TABLET | Freq: Once | ORAL | Status: AC
  Administered 2023-05-04: 10 mg via ORAL
  Filled 2023-05-04: qty 1

## 2023-05-04 MED ORDER — SODIUM CHLORIDE 0.9% FLUSH
10.0000 mL | INTRAVENOUS | Status: DC | PRN
  Administered 2023-05-04: 10 mL

## 2023-05-04 MED ORDER — SODIUM CHLORIDE 0.9 % IV SOLN: INTRAVENOUS | Status: DC

## 2023-05-04 MED ORDER — GEMCITABINE HCL CHEMO INJECTION 1 GM/26.3ML
1000.0000 mg/m2 | Freq: Once | INTRAVENOUS | Status: AC
  Administered 2023-05-04: 1824 mg via INTRAVENOUS
  Filled 2023-05-04: qty 47.97

## 2023-05-04 MED ORDER — HEPARIN SOD (PORK) LOCK FLUSH 100 UNIT/ML IV SOLN
500.0000 [IU] | Freq: Once | INTRAVENOUS | Status: AC | PRN
Start: 2023-05-04 — End: 2023-05-04
  Administered 2023-05-04: 500 [IU]

## 2023-05-04 NOTE — Progress Notes (Signed)
 [facility] [phone]) [phone]   [phone]) [phone]  PROGRESS NOTE  Patient Care Team: Dorena Gander, MD as PCP - General (Family Medicine)  Hematological/Oncological History # Metastatic Adenocarcinoma of the Lung  01/07/2020 : CT of the chest with contrast performed which showed interval enlargement of a spiculated nodule in the central left upper lobe measuring 1.2 x 1.0 cm and highly suspicious for primary lung malignancy 02/06/2020 :PET scan performed which showed a hypermetabolic irregular solid 1.3 cm left upper lobe pulmonary nodule compatible with malignancy without any other hypermetabolic lesions 04/06/4096- 04/19/2020:  SBRT to the lung lesion 04/28/2021: CT C/A/P showed multifocal lytic bone metastases are identified. Lesion within the spine of the left scapula and lesions involving bilateral iliac bones and left sacral wing. Establish care with Dr. Rosaline Coma  05/03/2021: biopsy of right iliac lytic lesion showed metastatic carcinoma, consistent with lung primary.  07/08/2021: Cycle 1 Day 1 of Carbo/Pem/Pem 07/29/2021: Cycle 2 Day 1 of Carbo/Pem/Pem 08/11/2021-08/24/2021: Received palliative radiation to osseous metastasis in the left shoulder. 30 Gy in 10 fractions.  08/19/2021: Cycle 3 Day 1 of Carbo/Pem/Pem 09/09/2021: Cycle 4 Day 1 of Carbo/Pem/Pem 09/23/2021: CT CAP: stable disease 09/30/2021: Cycle 5 Day 1 of Pem/Pem 10/21/2021: Cycle 6 Day 1 of Pembrolizumab . Held pemetrexed  due to anemia. 11/16/2021: Cycle 7 Day 1 of Pem/Pem.   12/16/2021: Cycle 8 Day 1 of Pem/Pem.   01/06/2022: Cycle 9 Day 1 of Pem/Pem.  01/27/2022: Cycle 10 Day 1 of Pem/Pem 02/17/2022: Cycle 11 Day 1 of Pem/Pem 03/10/2022: Cycle 12 Day 1 of Pem/Pem (HELD due to anemia with Hgb of 6.4) 03/17/2022: Cycle 12 Day 1 of Pem/Pem (HELD pemextrexed due to cytopenias/kidney function) 04/10/2022: Cycle 13 Day 1 of Pem/Pem (HELD pemextrexed due to cytopenias/kidney function) 04/28/2022: Cycle 14 Day 1 of Pem/Pem  (HELD pemextrexed due to cytopenias/kidney function) 05/23/2022:  Cycle 15 Day 1 of Pem/Pem (HELD pemextrexed due to cytopenias/kidney function) 06/15/2022: Cycle 16 Day 1 of Pembrolizumab .  07/07/2022: Cycle 17 Day 1 of Pembrolizumab .  07/28/2022: Cycle 18 Day 1 of Pembrolizumab  08/18/2022: Cycle 19 Day 1 of Pembrolizumab  HELD due to hyponatremia of 118, new bone metastases. 08/25/2022: Cycle 19 Day 1 of Pembrolizumab  08/28/2022-09/13/2022: Received palliative radiation to right pelvis with total dose of 30 Gy in 10 Fx.  09/22/2022:  Cycle 20 Day 1 of Pembrolizumab  10/13/2022:  Cycle 21 Day 1 of Pembrolizumab  12/11/2022: Cycle 1 Day 1 of Docetaxel /Ramucircumab.  01/05/2023: Cycle 2 Day 1 of Docetaxel /Ramucircumab.  01/26/2023: Cycle 3 Day 1 of Docetaxel /Ramucircumab 02/16/2023:  Cycle 4 Day 1 of Docetaxel /Ramucircumab 03/09/2023: Cycle 5 Day 1 of Docetaxel /Ramucircumab 03/30/2023:  Cycle 6 Day 1 of Docetaxel /Ramucircumab 04/20/2023-04/23/2023: Admitted for slurred speech and weakness. MRI brain concerning for stroke. CTA chest found progression of irregular consolidation in the left apex.  04/27/2023: Cycle 1, Day 1 of Gemcitabine    #Adenocarcinoma of the Prostate, T1cN0M0 08/19/2019-10/07/2019: 70 Gy in 28 fractions of 2.5 Gy.  Radiation to the prostate was under the care of Dr. Kenith Payer  Interval History:  [name]. 68 y.o. male with medical history significant for metastatic adenocarcinoma of the lung who presents for a follow up visit. The patient's last visit was on 04/25/2023. In the interim since the last visit, he initiated single agent gemcitabine .  On exam today, Mr. Gersh reports that he tolerated gemcitabine  therapy without any significant symptoms.  He did experience some fatigue for 1 to 2 days after the treatment which resolved on its own.  He  is using a walker to help with ambulation.  He denies any nausea, vomiting or bowel habit changes.  His appetite is fairly stable but he  has lost another 3 pounds since last visit.  He denies easy bruising or signs of active bleeding.  He reports mild increase in shortness of breath with exertion but none at rest.  Overall he feels back to his baseline level of health and willing and able to proceed with treatment at this time.  He denies fevers, chills, night sweats, chest pain or cough.  Full 10 point ROS is otherwise negative.  MEDICAL HISTORY:  Past Medical History:  Diagnosis Date   Alcohol abuse    Asthma    Bullous emphysema ([name])    Essential hypertension 04/10/2020   Hemorrhoids    History of radiation therapy 04/08/20-04/19/20   IMRT- Left lung- Dr. Retta Caster   Incidental lung nodule, greater than or equal to 8mm 10/22/2017   Left upper lobe - discovered on CTA   Prostate cancer ([name])    Spontaneous pneumothorax 10/20/2017   right   Tobacco abuse     SURGICAL HISTORY: Past Surgical History:  Procedure Laterality Date   BACK SURGERY     IR IMAGING GUIDED PORT INSERTION  06/29/2021   PROSTATE BIOPSY      SOCIAL HISTORY: Social History   Socioeconomic History   Marital status: Single    Spouse name: Not on file   Number of children: Not on file   Years of education: Not on file   Highest education level: Not on file  Occupational History   Occupation: retired  Tobacco Use   Smoking status: Former    Current packs/day: 0.00    Types: Cigarettes    Quit date: 11/22/2019    Years since quitting: 3.4   Smokeless tobacco: Never   Tobacco comments:    Patient reports quit 3 years ago. 06/25/20. [facility]  Vaping Use   Vaping status: Never Used  Substance and Sexual Activity   Alcohol use: Yes    Alcohol/week: 12.0 standard drinks of alcohol    Types: 12 Cans of beer per week    Comment: daily sometimes   Drug use: No   Sexual activity: Not Currently  Other Topics Concern   Not on file  Social History Narrative   Not on file   Social Drivers of Health   Financial Resource Strain: Not on file   Food Insecurity: No Food Insecurity (04/22/2023)   Hunger Vital Sign    Worried About Running Out of Food in the Last Year: Never true    Ran Out of Food in the Last Year: Never true  Transportation Needs: No Transportation Needs (04/22/2023)   PRAPARE - Administrator, Civil Service (Medical): No    Lack of Transportation (Non-Medical): No  Recent Concern: Transportation Needs - Unmet Transportation Needs (04/21/2023)   PRAPARE - Administrator, Civil Service (Medical): Yes    Lack of Transportation (Non-Medical): No  Physical Activity: Not on file  Stress: Not on file  Social Connections: Moderately Integrated (04/21/2023)   Social Connection and Isolation Panel [NHANES]    Frequency of Communication with Friends and Family: More than three times a week    Frequency of Social Gatherings with Friends and Family: More than three times a week    Attends Religious Services: 1 to 4 times per year    Active Member of Clubs or Organizations: No  Attends Banker Meetings: Never    Marital Status: Married  Catering manager Violence: Not At Risk (04/22/2023)   Humiliation, Afraid, Rape, and Kick questionnaire    Fear of Current or Ex-Partner: No    Emotionally Abused: No    Physically Abused: No    Sexually Abused: No    FAMILY HISTORY: Family History  Problem Relation Age of Onset   Cancer Cousin        maternal cousin   Cancer Cousin        paternal cousin   Cancer Cousin    Colon polyps Neg Hx    Pancreatic disease Neg Hx    Pancreatic cancer Neg Hx    Breast cancer Neg Hx    Colon cancer Neg Hx     ALLERGIES:  is allergic to morphine  and oxycodone .  MEDICATIONS:  Current Outpatient Medications  Medication Sig Dispense Refill   acetaminophen  (TYLENOL ) 325 MG tablet Take 2 tablets (650 mg total) by mouth every 6 (six) hours as needed for mild pain or headache (fever >/= 101).     albuterol  (VENTOLIN  HFA) 108 (90 Base) MCG/ACT inhaler  Inhale 2 puffs into the lungs every 6 (six) hours as needed for wheezing or shortness of breath.     [Paused] amLODipine  (NORVASC ) 10 MG tablet Take 1 tablet by mouth daily.     aspirin  EC 325 MG tablet Take 1 tablet (325 mg total) by mouth daily. 30 tablet 1   atorvastatin  (LIPITOR) 40 MG tablet Take 1 tablet (40 mg total) by mouth daily. 30 tablet 1   cyanocobalamin  (VITAMIN B12) 1000 MCG tablet Take 1 tablet (1,000 mcg total) by mouth daily. (Patient taking differently: Take 1,000 mcg by mouth as needed (When directed by physican).) 90 tablet 1   dronabinol  (MARINOL ) 5 MG capsule Take 1 capsule (5 mg total) by mouth 2 (two) times daily before lunch and supper. 60 capsule 0   ferrous sulfate  325 (65 FE) MG EC tablet Take 1 tablet (325 mg total) by mouth daily with breakfast. 30 tablet 3   folic acid  (FOLVITE ) 1 MG tablet Take 1 tablet (1 mg total) by mouth daily. 90 tablet 3   hydrOXYzine  (ATARAX ) 25 MG tablet Take 1 tablet (25 mg total) by mouth at bedtime as needed for itching. 30 tablet 11   ipratropium-albuterol  (DUONEB) 0.5-2.5 (3) MG/3ML SOLN Take 3 mLs by nebulization every 6 (six) hours as needed. (Patient taking differently: Take 3 mLs by nebulization every 6 (six) hours as needed (shortness of breathing or wheezing).) 360 mL 0   lidocaine  (LIDODERM ) 5 % Place 1 patch onto the skin daily. Remove & Discard patch within 12 hours or as directed by MD. Apply to low back 30 patch 0   lidocaine -prilocaine  (EMLA ) cream Apply 1 Application topically as needed. 30 g 0   [Paused] olmesartan (BENICAR) 40 MG tablet Take 40 mg by mouth daily.     polyethylene glycol (MIRALAX ) 17 g packet Take 17 g by mouth daily. 30 each 2   potassium chloride  SA (KLOR-CON  M) 20 MEQ tablet TAKE 1 TABLET(20 MEQ) BY MOUTH DAILY 90 tablet 0   SENNA S 8.6-50 MG tablet Take 2 tablets by mouth at bedtime as needed for mild constipation or moderate constipation.     senna-docusate (STIMULANT LAXATIVE) 8.6-50 MG tablet Take 2  tablets by mouth 2 (two) times daily. May add additional tablet as needed 150 tablet 3   sodium chloride  1 g tablet Take 1  tablet (1 g total) by mouth 3 (three) times daily with meals. 90 tablet 3   thiamine  100 MG tablet Take 1 tablet (100 mg total) by mouth daily.     traMADol  (ULTRAM ) 50 MG tablet Take 1-2 tablets (50-100 mg total) by mouth every 6 (six) hours as needed. 120 tablet 0   No current facility-administered medications for this visit.    REVIEW OF SYSTEMS:   Constitutional: ( - ) fevers, ( - )  chills , ( - ) night sweats Eyes: ( - ) blurriness of vision, ( - ) double vision, ( - ) watery eyes Ears, nose, mouth, throat, and face: ( - ) mucositis, ( - ) sore throat Respiratory: ( - ) cough, ( - ) dyspnea, ( - ) wheezes Cardiovascular: ( - ) palpitation, ( - ) chest discomfort, ( - ) lower extremity swelling Gastrointestinal:  ( - ) nausea, ( - ) heartburn, ( - ) change in bowel habits Skin: ( - ) abnormal skin rashes Lymphatics: ( - ) new lymphadenopathy, ( - ) easy bruising Neurological: ( - ) numbness, ( - ) tingling, ( - ) new weaknesses Behavioral/Psych: ( - ) mood change, ( - ) new changes  All other systems were reviewed with the patient and are negative.  PHYSICAL EXAMINATION: ECOG PERFORMANCE STATUS: 1 - Symptomatic but completely ambulatory  Vitals:   05/04/23 1043  BP: 124/79  Pulse: 100  Resp: 16  Temp: 97.9 F (36.6 C)  SpO2: 100%      Filed Weights   05/04/23 1043  Weight: 149 lb 14.4 oz (68 kg)    GENERAL: Well-appearing elderly African-American male, alert, no distress and comfortable SKIN: skin color, texture, turgor are normal, no rashes or significant lesions EYES: conjunctiva are pink and non-injected, sclera clear LUNGS: clear to auscultation and percussion with normal breathing effort HEART: regular rate & rhythm and no murmurs and no lower extremity edema Musculoskeletal: no cyanosis of digits and no clubbing  PSYCH: alert & oriented x  3, fluent speech NEURO: no focal motor/sensory deficits  LABORATORY DATA:  I have reviewed the data as listed    Latest Ref Rng & Units 05/04/2023   10:20 AM 04/25/2023   10:20 AM 04/23/2023    3:28 AM  CBC  WBC 4.0 - 10.5 K/uL 7.9  14.9  18.7   Hemoglobin 13.0 - 17.0 g/dL 8.9  8.6  9.0   Hematocrit 39.0 - 52.0 % 27.1  26.1  28.9   Platelets 150 - 400 K/uL 227  324  303        Latest Ref Rng & Units 04/25/2023   10:20 AM 04/23/2023    3:28 AM 04/22/2023    4:07 AM  CMP  Glucose 70 - 99 mg/dL 88  161  096   BUN 8 - 23 mg/dL 16  15  14    Creatinine 0.61 - 1.24 mg/dL 0.45  4.09  8.11   Sodium 135 - 145 mmol/L 134  134  134   Potassium 3.5 - 5.1 mmol/L 3.8  3.5  4.0   Chloride 98 - 111 mmol/L 105  102  104   CO2 22 - 32 mmol/L 25  25  25    Calcium  8.9 - 10.3 mg/dL 8.5  8.0  8.6   Total Protein 6.5 - 8.1 g/dL 5.8  5.1  5.4   Total Bilirubin 0.0 - 1.2 mg/dL 0.3  0.4  0.2   Alkaline Phos 38 - 126 U/L  57  49  46   AST 15 - 41 U/L 13  19  15    ALT 0 - 44 U/L 13  15  13      Lab Results  Component Value Date   MPROTEIN Not Observed 04/28/2021   Lab Results  Component Value Date   KPAFRELGTCHN 33.6 (H) 04/28/2021   LAMBDASER 22.0 04/28/2021   KAPLAMBRATIO 1.53 04/28/2021    RADIOGRAPHIC STUDIES: VAS US  LOWER EXTREMITY VENOUS (DVT) Result Date: 04/22/2023  Lower Venous DVT Study Patient Name:  Warsame Hendershot.  Date of Exam:   04/21/2023 Medical Rec #: [id number]             Accession #:    [id number] Date of Birth: February 17, 1955              Patient Gender: M Patient Age:   73 years Exam Location:  [facility] Procedure:      VAS US  LOWER EXTREMITY VENOUS (DVT) Referring Phys: Baldwin Levee --------------------------------------------------------------------------------  Indications: Stroke.  Risk Factors: Cancer Stage IV lung and prostate cancer with mets to bones, on chemo. Limitations: Edema and Shadowing from arterial calcification. Comparison Study: No prior study on file.  Performing Technologist: Ria Chad  Examination Guidelines: A complete evaluation includes B-mode imaging, spectral Doppler, color Doppler, and power Doppler as needed of all accessible portions of each vessel. Bilateral testing is considered an integral part of a complete examination. Limited examinations for reoccurring indications may be performed as noted. The reflux portion of the exam is performed with the patient in reverse Trendelenburg.  +---------+---------------+---------+-----------+----------+--------------+ RIGHT    CompressibilityPhasicitySpontaneityPropertiesThrombus Aging +---------+---------------+---------+-----------+----------+--------------+ CFV      Full           Yes      Yes                                 +---------+---------------+---------+-----------+----------+--------------+ SFJ      Full           Yes      Yes                                 +---------+---------------+---------+-----------+----------+--------------+ FV Prox  Full                                                        +---------+---------------+---------+-----------+----------+--------------+ FV Mid   Full                                                        +---------+---------------+---------+-----------+----------+--------------+ FV DistalFull                                                        +---------+---------------+---------+-----------+----------+--------------+ PFV      Full                                                        +---------+---------------+---------+-----------+----------+--------------+  POP      Full           Yes      Yes                                 +---------+---------------+---------+-----------+----------+--------------+ PTV      Full                                                        +---------+---------------+---------+-----------+----------+--------------+ PERO     Full                                                         +---------+---------------+---------+-----------+----------+--------------+   +---------+---------------+---------+-----------+----------+--------------+ LEFT     CompressibilityPhasicitySpontaneityPropertiesThrombus Aging +---------+---------------+---------+-----------+----------+--------------+ CFV      Full           Yes      Yes                                 +---------+---------------+---------+-----------+----------+--------------+ SFJ      Full           Yes      Yes                                 +---------+---------------+---------+-----------+----------+--------------+ FV Prox  Full                                                        +---------+---------------+---------+-----------+----------+--------------+ FV Mid   Full                                                        +---------+---------------+---------+-----------+----------+--------------+ FV DistalFull                                                        +---------+---------------+---------+-----------+----------+--------------+ PFV      Full                                                        +---------+---------------+---------+-----------+----------+--------------+ POP      Full           Yes      Yes                                 +---------+---------------+---------+-----------+----------+--------------+  PTV      Full                                                        +---------+---------------+---------+-----------+----------+--------------+ PERO     Full                                                        +---------+---------------+---------+-----------+----------+--------------+     Summary: RIGHT: - There is no evidence of deep vein thrombosis in the lower extremity.  - No cystic structure found in the popliteal fossa.  LEFT: - There is no evidence of deep vein thrombosis in the lower extremity.  - No cystic structure  found in the popliteal fossa.  *See table(s) above for measurements and observations. Electronically signed by Runell Countryman on 04/22/2023 at 3:52:32 PM.    Final    VAS US  CAROTID (at [facility] and [facility] only) Result Date: 04/22/2023 Carotid Arterial Duplex Study Patient Name:  Awan Slavich.  Date of Exam:   04/21/2023 Medical Rec #: [id number]             Accession #:    [id number] Date of Birth: February 11, 1955              Patient Gender: M Patient Age:   81 years Exam Location:  [facility] Procedure:      VAS US  CAROTID Referring Phys: Bertell Broach PRATT --------------------------------------------------------------------------------  Indications:       CVA, Carotid artery disease, Speech disturbance and Weakness. Risk Factors:      Hypertension, current smoker. Other Factors:     Stage IV lung and prostate cancer with mets to bones, on                    chemo.                    CT noted approximately 70-80% stenosis in bilateral carotid                    arteries. Comparison Study:  No prior exam. on file. Performing Technologist: Ria Chad  Examination Guidelines: A complete evaluation includes B-mode imaging, spectral Doppler, color Doppler, and power Doppler as needed of all accessible portions of each vessel. Bilateral testing is considered an integral part of a complete examination. Limited examinations for reoccurring indications may be performed as noted.  Right Carotid Findings: +----------+--------+--------+--------+-------------------------+--------+           PSV cm/sEDV cm/sStenosisPlaque Description       Comments +----------+--------+--------+--------+-------------------------+--------+ CCA Prox  78      23                                                +----------+--------+--------+--------+-------------------------+--------+ CCA Mid   60      13              hyperechoic and calcific          +----------+--------+--------+--------+-------------------------+--------+  CCA Distal62  24                                                +----------+--------+--------+--------+-------------------------+--------+ ICA Prox  92      41              heterogenous and calcific         +----------+--------+--------+--------+-------------------------+--------+ ICA Mid   188     82      60-79%  calcific                          +----------+--------+--------+--------+-------------------------+--------+ ICA Distal100     38                                                +----------+--------+--------+--------+-------------------------+--------+ ECA       73      15              hyperechoic and calcific          +----------+--------+--------+--------+-------------------------+--------+ +----------+--------+-------+----------------+-------------------+           PSV cm/sEDV cmsDescribe        Arm Pressure (mmHG) +----------+--------+-------+----------------+-------------------+ Subclavian122     19     Diffused plaque.                    +----------+--------+-------+----------------+-------------------+ +---------+--------+---+--------+--+ VertebralPSV cm/s120EDV cm/s36 +---------+--------+---+--------+--+  Left Carotid Findings: +----------+--------+--------+--------+-------------------------+--------+           PSV cm/sEDV cm/sStenosisPlaque Description       Comments +----------+--------+--------+--------+-------------------------+--------+ CCA Prox                  Occluded                                  +----------+--------+--------+--------+-------------------------+--------+ CCA Mid                   Occluded                                  +----------+--------+--------+--------+-------------------------+--------+ CCA [id number]             >50%                                      +----------+--------+--------+--------+-------------------------+--------+ ICA Prox  109     47      40-59%  heterogenous  and calcific         +----------+--------+--------+--------+-------------------------+--------+ ICA Mid   47      28                                                +----------+--------+--------+--------+-------------------------+--------+ ICA Distal37      22                                                +----------+--------+--------+--------+-------------------------+--------+  ECA       216     102     >50%    heterogenous and calcific         +----------+--------+--------+--------+-------------------------+--------+ +----------+--------+--------+----------------+-------------------+           PSV cm/sEDV cm/sDescribe        Arm Pressure (mmHG) +----------+--------+--------+----------------+-------------------+ Subclavian303     24      Diffused plaque.                    +----------+--------+--------+----------------+-------------------+ +---------+--------+---+--------+--+------------------+ VertebralPSV cm/s125EDV cm/s48Stenotic at origin +---------+--------+---+--------+--+------------------+   Summary: Right Carotid: Velocities in the right ICA are consistent with a 60-79%                stenosis. Left Carotid: Velocities in the left ICA are consistent with a 40-59% stenosis.               The ECA appears >50% stenosed. Vertebrals:  Bilateral vertebral arteries demonstrate antegrade flow. Left:              elevated velocities. Subclavians: Left subclavian artery was stenotic. Normal flow hemodynamics were              seen in the right subclavian artery. *See table(s) above for measurements and observations.  Suggest Peripheral Vascular Consult. Electronically signed by Ardella Beaver MD on 04/22/2023 at 10:50:30 AM.    Final    CT ANGIO HEAD NECK W WO CM Result Date: 04/21/2023 CLINICAL DATA:  Mental status change of unknown cause. Abnormal cerebellar finding on MRI yesterday. EXAM: CT ANGIOGRAPHY HEAD AND NECK WITH AND WITHOUT CONTRAST TECHNIQUE: Multidetector CT  imaging of the head and neck was performed using the standard protocol during bolus administration of intravenous contrast. Multiplanar CT image reconstructions and MIPs were obtained to evaluate the vascular anatomy. Carotid stenosis measurements (when applicable) are obtained utilizing NASCET criteria, using the distal internal carotid diameter as the denominator. RADIATION DOSE REDUCTION: This exam was performed according to the departmental dose-optimization program which includes automated exposure control, adjustment of the mA and/or kV according to patient size and/or use of iterative reconstruction technique. CONTRAST:  75mL OMNIPAQUE  IOHEXOL  350 MG/ML SOLN COMPARISON:  MRI 04/20/2023. Neck CT 04/20/2023. Head CT 04/20/2023. Brain MRI 05/05/2021. FINDINGS: CT HEAD FINDINGS Brain: Brain detail is limited by some technical deficiency. Previously described peripheral left cerebellar lesion is not visible on this exam. No sign of acute stroke, mass, hemorrhage, hydrocephalus or extra-axial collection. 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 Vascular: No abnormal vascular finding. 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 Sinuses/Orbits: Clear/normal Other: None Review of the MIP images confirms the above findings CTA NECK FINDINGS Aortic arch: Aortic atherosclerosis.  Branching pattern is normal. Right carotid system: Common carotid artery shows a 30% stenosis at its origin from the innominate artery. Beyond that, there is scattered plaque but the vessel is patent to the bifurcation. Dense calcified plaque at the carotid bifurcation and ICA bulb. Multiple serial stenoses. In the distal bulb, minimal diameter is 1 mm or less. Compared to a more distal cervical ICA diameter of 5 mm, this indicates an 80% or greater stenosis. Left carotid system: Common carotid artery shows severe stenosis  at its origin, 80% or greater. Vessel is occluded 2 cm beyond that in the upper mediastinum. No flow within the left common carotid artery beyond that. Dense calcified  plaque at the carotid bifurcation and ICA bulb. There is reconstituted flow at this level, probably from external to internal collateral vessels. Beyond the atherosclerotic disease at the bifurcation, the ICA is patent 2 in through the skull base as a small vessel, diameter 2.8 mm. Vertebral arteries: No flow limiting proximal subclavian stenosis on the right. Calcified plaque at the right vertebral artery origin and affecting the proximal vertebral artery extensively with multiple serial stenoses. Greatest stenosis in the mid cervical region is probably a 70-80%. The vessel does show flow to the skull base and foramen magnum level. On the left, there is moderate stenosis of the proximal subclavian artery, estimated at 50%. The left vertebral artery origin is widely patent and the vessel shows some scattered plaque but is patent through the cervical region without stenosis greater than 30%. Skeleton: Ordinary cervical spondylosis. Other neck: No mass or lymphadenopathy. Upper chest: Advanced emphysema and pulmonary scarring. Spiculated lesion in the left upper lobe as evaluated on chest CT yesterday. Please see results of prior chest CT exam. Review of the MIP images confirms the above findings CTA HEAD FINDINGS Anterior circulation: Both internal carotid arteries are patent through the skull base. There is advanced siphon atherosclerotic disease with multiple stenoses on both sides. Serial stenoses are estimated in the range of 50-70%. The anterior and middle cerebral vessels show flow. No large vessel occlusion. Distal vessels show some atherosclerotic irregularity. Posterior circulation: Both vertebral arteries are patent through the foramen magnum to the basilar artery. Venous sinuses: Patent and normal. Anatomic variants: None significant. Review  of the MIP images confirms the above findings IMPRESSION: 1. No acute head CT finding. Previously described peripheral left cerebellar lesion is not visible on this exam due to technical deficiency. 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 2. 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 3. Aortic atherosclerosis. 4. 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 Vessel is occluded 2 cm beyond that in the upper mediastinum. No flow within the left common carotid artery beyond that. Dense calcified plaque at the left carotid bifurcation and ICA bulb. Reconstituted flow at this level, probably from external to internal collateral vessels. Beyond the atherosclerotic disease at the bifurcation, the ICA is patent to the skull base as a small vessel, diameter 2.8 mm. 5. Advanced atherosclerotic disease at the right carotid bifurcation and ICA bulb. Multiple serial stenoses. In the distal bulb, minimal diameter is 1 mm or less. Compared to a more distal cervical ICA diameter of 5 mm, this indicates an 80% or greater stenosis. 6. Extensive atherosclerotic disease of the proximal right vertebral artery with multiple serial stenoses. Greatest stenosis in the mid cervical region is probably a 70-80%. The vessel does show flow to the skull base and foramen magnum level. 7. 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 8. Advanced atherosclerotic disease of both carotid siphons with multiple stenoses on both sides. Serial stenoses are estimated in the range of 50-70%. 9. No acute intracranial large or medium vessel occlusion. The left cerebellar lesion remains indeterminate at this time for a late subacute stroke versus an atypical metastasis. Additionally on the MRI, there is an up to 12 mm newly seen lesion in the region of the left cerebellar  flocculus that was not there in May 27, 2021. Although this is nonenhancing, it is viewed  with some suspicion. It could be a newly developed cyst, though I do not know why that would happen. Therefore, this should also be observed for the possibility of a metastasis. Aortic Atherosclerosis (ICD10-I70.0) and Emphysema (ICD10-J43.9). Electronically Signed   By: Bettylou Brunner M.D.   On: 04/21/2023 13:10   ECHOCARDIOGRAM COMPLETE Result Date: 04/21/2023    ECHOCARDIOGRAM REPORT   Patient Name:   Mahonri Milhouse. Date of Exam: 04/21/2023 Medical Rec #:  [id number]            Height:       69.0 in Accession #:    [id number]           Weight:       142.9 lb Date of Birth:  23-May-1955             BSA:          1.791 m Patient Age:    67 years             BP:           149/98 mmHg Patient Gender: M                    HR:           102 bpm. Exam Location:  Inpatient Procedure: 2D Echo, Cardiac Doppler, Color Doppler and Intracardiac            Opacification Agent (Both Spectral and Color Flow Doppler were            utilized during procedure). Indications:    Stroke  History:        Patient has prior history of Echocardiogram examinations. Risk                 Factors:Former Smoker and Hypertension.  Sonographer:    Willey Harrier Referring Phys: [id number] Maisie Scotland PRATT  Sonographer Comments: Suboptimal parasternal window, suboptimal apical window and Technically difficult study due to poor echo windows. Image acquisition challenging due to respiratory motion. IMPRESSIONS  1. Left ventricular ejection fraction, by estimation, is 60 to 65%. The left ventricle has normal function. The left ventricle has no regional wall motion abnormalities. Indeterminate diastolic filling due to E-A fusion.  2. Right ventricular systolic function is normal. The right ventricular size is normal. Tricuspid regurgitation signal is inadequate for assessing PA pressure.  3. The mitral valve is grossly normal. No evidence of mitral valve regurgitation.  No evidence of mitral stenosis.  4. The aortic valve is tricuspid. Aortic valve regurgitation is not visualized. No aortic stenosis is present.  5. Aortic dilatation noted. There is mild dilatation of the aortic root, measuring 41 mm. There is mild dilatation of the ascending aorta, measuring 41 mm.  6. The inferior vena cava is normal in size with greater than 50% respiratory variability, suggesting right atrial pressure of 3 mmHg. Conclusion(s)/Recommendation(s): No intracardiac source of embolism detected on this transthoracic study. Consider a transesophageal echocardiogram to exclude cardiac source of embolism if clinically indicated. FINDINGS  Left Ventricle: Left ventricular ejection fraction, by estimation, is 60 to 65%. The left ventricle has normal function. The left ventricle has no regional wall motion abnormalities. The left ventricular internal cavity size was normal in size. There is  no left ventricular hypertrophy. Indeterminate diastolic filling due to E-A fusion. Right Ventricle: The right ventricular size is normal. No increase in right ventricular wall thickness. Right ventricular systolic function is normal. Tricuspid regurgitation signal is inadequate for  assessing PA pressure. Left Atrium: Left atrial size was normal in size. Right Atrium: Right atrial size was not well visualized. Pericardium: There is no evidence of pericardial effusion. Presence of epicardial fat layer. Mitral Valve: The mitral valve is grossly normal. No evidence of mitral valve regurgitation. No evidence of mitral valve stenosis. MV peak gradient, 3.3 mmHg. The mean mitral valve gradient is 1.0 mmHg. Tricuspid Valve: The tricuspid valve is grossly normal. Tricuspid valve regurgitation is trivial. No evidence of tricuspid stenosis. Aortic Valve: The aortic valve is tricuspid. Aortic valve regurgitation is not visualized. No aortic stenosis is present. Aortic valve peak gradient measures 3.6 mmHg. Pulmonic Valve: The  pulmonic valve was grossly normal. Pulmonic valve regurgitation is not visualized. No evidence of pulmonic stenosis. Aorta: Aortic dilatation noted. There is mild dilatation of the aortic root, measuring 41 mm. There is mild dilatation of the ascending aorta, measuring 41 mm. Venous: The inferior vena cava is normal in size with greater than 50% respiratory variability, suggesting right atrial pressure of 3 mmHg. IAS/Shunts: The interatrial septum was not well visualized.  LEFT VENTRICLE PLAX 2D LVIDd:         4.00 cm   Diastology LVIDs:         2.80 cm   LV e' medial:    5.77 cm/s LV PW:         1.10 cm   LV E/e' medial:  9.4 LV IVS:        1.00 cm   LV e' lateral:   7.07 cm/s LVOT diam:     2.10 cm   LV E/e' lateral: 7.7 LV SV:         49 LV SV Index:   27 LVOT Area:     3.46 cm  IVC IVC diam: 1.50 cm AORTIC VALVE AV Area (Vmax): 2.99 cm AV Vmax:        94.60 cm/s AV Peak Grad:   3.6 mmHg LVOT Vmax:      81.60 cm/s LVOT Vmean:     58.300 cm/s LVOT VTI:       0.141 m  AORTA Ao Root diam: 4.10 cm Ao Asc diam:  4.10 cm MITRAL VALVE MV Area (PHT): 4.71 cm    SHUNTS MV Area VTI:   3.90 cm    Systemic VTI:  0.14 m MV Peak grad:  3.3 mmHg    Systemic Diam: 2.10 cm MV Mean grad:  1.0 mmHg MV Vmax:       0.90 m/s MV Vmean:      57.3 cm/s MV Decel Time: 161 msec MV E velocity: 54.40 cm/s MV A velocity: 90.40 cm/s MV E/A ratio:  0.60 Jackquelyn Mass MD Electronically signed by Jackquelyn Mass MD Signature Date/Time: 04/21/2023/12:32:53 PM    Final    MR Brain W and Wo Contrast Result Date: 04/20/2023 CLINICAL DATA:  Mental status change, unknown cause EXAM: MRI HEAD WITHOUT AND WITH CONTRAST TECHNIQUE: Multiplanar, multiecho pulse sequences of the brain and surrounding structures were obtained without and with intravenous contrast. CONTRAST:  6mL GADAVIST  GADOBUTROL  1 MMOL/ML IV SOLN COMPARISON:  Same day CT head.  MRI head May 4, 23. FINDINGS: Brain: Round, nonenhancing 7 mm focus of mild DWI hyperintensity in the medial  left cerebellum (series 5, image 6 and series 8, image 4), which is new from prior. No substantial mass effect. No pathologic enhancement. No definite acute hemorrhage or hydrocephalus. Vascular: Major arterial flow voids are maintained skull base. Skull and upper cervical spine:  Normal marrow signal. Sinuses/Orbits: Clear sinuses.  No acute orbital findings. IMPRESSION: Round, nonenhancing 7 mm focus of mild DWI hyperintensity in the medial left cerebellum, which is new from prior. This finding is indeterminate but may represent a recent or subacute infarct. No associated edema or enhancement to suggest metastasis; however, given the clinical history, recommend short interval follow-up MRI head with contrast in 4-6 weeks to ensure expected evolution. Electronically Signed   By: Stevenson Elbe M.D.   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 History of COPD. Generalized weakness and slurred speech. Recent change to voice. EXAM: CT ANGIOGRAPHY CHEST WITH CONTRAST TECHNIQUE: Multidetector CT imaging of the chest was performed using the standard protocol during bolus administration of intravenous contrast. Multiplanar CT image reconstructions and MIPs were obtained to evaluate the vascular anatomy. RADIATION DOSE REDUCTION: This exam was performed according to the departmental dose-optimization program which includes automated exposure control, adjustment of the mA and/or kV according to patient size and/or use of iterative reconstruction technique. CONTRAST:  75mL OMNIPAQUE  IOHEXOL  350 MG/ML SOLN COMPARISON:  Same day chest radiograph and CT 02/15/2023 FINDINGS: Cardiovascular: No pericardial effusion. Negative for acute pulmonary embolism. Aortic and coronary artery atherosclerotic calcification. Right chest wall Port-A-Cath. Mediastinum/Nodes: Trachea and esophagus are unremarkable. Similar  soft tissue thickening about the inferior left hilum compared to prior and likely representing post treatment change. Lungs/Pleura: Advanced paraseptal and centrilobular emphysema with bullous change in the left apex. Diffuse bronchial wall thickening. No pleural effusion or pneumothorax. Increased size of the irregular consolidation in the left apex now measuring 2.6 x 2.0 cm. This measured 2.4 x 1.3 cm on CT 02/15/2023 using similar measuring technique. This may represent progression of post treatment change however residual or recurrent malignancy is not excluded. Multiple pulmonary nodules are redemonstrated and are similar in size to 02/15/2023. Interval increase in size of a subpleural anterior right upper lobe nodule measuring 6 mm nodule (13/108) previously measured 4 mm. Multiple new nodules in the left lower lobe measuring up to 6 mm (series 13/image 288). Upper Abdomen: Stable hypoattenuating lesions in the liver likely representing benign cyst. No acute abnormality in the visualized abdomen. Musculoskeletal: No acute fracture. Similar mixed lytic and sclerotic appearance of the left scapula. Review of the MIP images confirms the above findings. IMPRESSION: 1. Negative for acute pulmonary embolism. 2. Increased size of the irregular consolidation in the left apex now measuring 2.6 x 2.0 cm. This measured 2.4 x 1.3 cm on CT 02/15/2023. This may represent progression of post treatment change however residual or recurrent malignancy is not excluded. Recommend close interval follow-up or PET/CT. 3. Multiple new nodules in the left lower lobe measuring up to 6 mm. Interval increase in size of a subpleural anterior right upper lobe nodule measuring 6 mm previously measured 4 mm. Aortic Atherosclerosis (ICD10-I70.0) and Emphysema (ICD10-J43.9). Electronically Signed   By: Rozell Cornet M.D.   On: 04/20/2023 19:41   CT Soft Tissue Neck W Contrast Result Date: 04/20/2023 CLINICAL DATA:  Soft tissue infection  suspected, generalized weakness and speech difficulty. History of metastatic prostate/lung cancer. EXAM: CT NECK WITH CONTRAST TECHNIQUE: Multidetector CT imaging of the neck was performed using the standard protocol following the bolus administration of intravenous contrast. RADIATION DOSE REDUCTION: This exam was performed according to the departmental dose-optimization program which includes automated exposure control, adjustment of the mA and/or kV  according to patient size and/or use of iterative reconstruction technique. CONTRAST:  75mL OMNIPAQUE  IOHEXOL  350 MG/ML SOLN COMPARISON:  None Available. FINDINGS: Pharynx and larynx: Symmetric appearance of the nasopharynx. The palatine tonsils are relatively symmetric. Normal appearance of the oral cavity and floor of mouth. The base of tongue is unremarkable. The epiglottis is normal. No retropharyngeal effusion. There is no evidence of fluid collection or peritonsillar abscess. Symmetric appearance of the aryepiglottic folds and piriform sinuses. Normal appearance of the paraglottic fat. The vocal folds are symmetric. Salivary glands: There is slightly asymmetric hyperattenuation of the right parotid gland which may be artifactual. The parotid glands are relatively symmetric in size. There is no inflammatory stranding adjacent to the right parotid gland. No ductal enlargement or evidence of abnormal calcification. The submandibular glands are symmetric. Thyroid : Normal. Lymph nodes: There are no enlarged lymph nodes in the neck. There is a 1.5 x 0.9 x 0.8 cm cystic appearing focus within the right posterior neck involving cervical level 2 B/5 which demonstrates internal fluid density. No definite associated soft tissue component. Vascular: Moderate atherosclerosis of the visualized aortic arch. There is prominent atherosclerosis involving the origin of the left subclavian artery resulting in at least moderate stenosis. Additional prominent atherosclerosis of  distal left subclavian/proximal left axillary artery. Prominent calcified atherosclerosis involving the bilateral carotid bifurcations. There is likely severe stenosis at the origins of both internal carotid arteries. Additional atherosclerosis of the partially visualized carotid siphons. Right IJ approach central venous catheter noted. Limited intracranial: Negative. Visualized orbits: Negative. Mastoids and visualized paranasal sinuses: Mastoid air cells are clear. Mucosal thickening in the alveolar recesses of the maxillary sinuses. Skeleton: No acute or aggressive finding noted. Degenerative changes throughout the cervical spine. Upper chest: Emphysema. Scarring in the bilateral lung apices and in the left upper lobe. See separately dictated CTA chest for complete evaluation of intrathoracic findings. Other: None. IMPRESSION: No acute abnormality along the aero digestive structures in the neck. No evidence of abscess. Slightly asymmetric attenuation of the right parotid gland which may be artifactual. No adjacent inflammatory stranding. Recommend correlation with tenderness over the right parotid. 1.5 cm cystic focus in the right posterior neck may reflect a lymphangioma/lymphatic malformation. There is no soft tissue component to suggest necrotic lymph node. Recommend short interval follow-up with CT or ultrasound of the neck. Atherosclerosis as above. 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 Consider CTA for further evaluation if clinically indicated. Electronically Signed   By: Denny Flack M.D.   On: 04/20/2023 18:26   DG Chest 2 View Result Date: 04/20/2023 CLINICAL DATA:  Cough and shortness of breath EXAM: CHEST - 2 VIEW COMPARISON:  X-ray 04/08/2023 and older FINDINGS: Stable right IJ chest port with tip along the central SVC. Hyperinflation with chronic lung changes. No pneumothorax, effusion or edema. Normal cardiopericardial silhouette. Calcified aorta. Overlapping cardiac  leads. Apical pleural thickening. Degenerative changes along the spine. IMPRESSION: Hyperinflation with chronic lung changes. Calcified aorta. Right IJ chest port. Electronically Signed   By: Adrianna Horde M.D.   On: 04/20/2023 12:43   CT HEAD WO CONTRAST Result Date: 04/20/2023 CLINICAL DATA:  Provided history: Mental status change, unknown cause. Additional history provided: Stroke-like symptoms. History of lung adenocarcinoma and prostate cancer. EXAM: CT HEAD WITHOUT CONTRAST TECHNIQUE: Contiguous axial images were obtained from the base of the skull through the vertex without intravenous contrast. RADIATION DOSE REDUCTION: This exam was performed according to the departmental dose-optimization program which includes automated exposure control,  adjustment of the mA and/or kV according to patient size and/or use of iterative reconstruction technique. COMPARISON:  Brain MRI 05/05/2021. FINDINGS: Brain: Generalized cerebral atrophy. 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 Patchy and ill-defined hypoattenuation within the cerebral white matter, nonspecific but compatible with mild chronic small vessel ischemic disease. There is no acute intracranial hemorrhage. No demarcated cortical infarct. No extra-axial fluid collection. No evidence of an intracranial mass. No midline shift. Vascular: No hyperdense vessel.  Atherosclerotic calcifications. Skull: No calvarial fracture. 9 x 3 mm osseous protrusion projecting outward from the right frontal calvarium most consistent with an osteoma. 12 mm lucent focus within the left parietal calvarium, extending to the inner table (series 4, image 32) (series 6, image 45). Sinuses/Orbits: No mass or acute finding within the imaged orbits. 6 mm left frontoethmoidal osteoma. Trace mucosal thickening scattered within the paranasal sinuses at the imaged levels. IMPRESSION: 1. Please note, assessment for intracranial metastatic disease is  limited on this non-contrast head CT. 2. No evidence of an acute intracranial abnormality. 3. 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 4. Mild cerebral white matter chronic small vessel ischemic disease. 5. Generalized cerebral atrophy. 6. Nonspecific 12 mm lucent focus within the left parietal calvarium. An osseous metastasis cannot be excluded. Consider a brain MRI (with and without contrast) for further evaluation. Electronically Signed   By: Bascom Lily D.O.   On: 04/20/2023 11:57   DG Chest Portable 1 View Result Date: 04/08/2023 CLINICAL DATA:  Altered mental status.  Shortness of breath. EXAM: PORTABLE CHEST 1 VIEW COMPARISON:  Radiograph 03/22/2023, CT 02/15/2023 FINDINGS: Right chest port remains in place. Advanced emphysema. Stable radiographic appearance of left upper post treatment related change. No evidence of acute airspace disease. No pneumothorax or large pleural effusion. No pulmonary edema. Stable heart size and mediastinal contours. Right costophrenic angle is excluded from the field of view. IMPRESSION: 1. Advanced emphysema. No acute findings. 2. Stable post treatment related change in the left upper lobe. Electronically Signed   By: Chadwick Colonel M.D.   On: 04/08/2023 21:48    ASSESSMENT & PLAN Gralyn Magiera. Is a 68 y.o.  male with medical history significant for metastatic adenocarcinoma of the lung who presents for a follow up visit.  # Metastatic Adenocarcinoma of the Lung  -- NGS testing from this patient shows no evidence of targetable mutation. --MRI brain shows no evidence of intracranial metastases --Started carboplatin , pembrolizumab , and pemetrexed  as treatment for his cancer on 07/08/2021. --Received palliative radiation to osseous metastasis of the left shoulder from 08/11/2021-08/24/2021. Planned dose is 30 Gy in 10 fx.  --Discontinued Pemetrexed  on 03/17/2022 due to persistent anemia and kidney dysfunction. 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 Other bone metastases including of the left scapula, sacrum, and left hemipelvis are unchanged. Unchanged treated central LUL mass. Recommend to continue on pembrolizumab  therapy and arrange for palliative radiation to enlarging bone metastases.  --CTA chest from 04/20/2023 showed progression of disease to the consolidation in the left apex. Recommended to change therapy  to single agent Gemcitabine  3 weeks on, 1 week off.  --Started Cycle 1, Day 1 of Gemcitabine  on 04/27/2023 Plan: --Due for Cycle 1, Day 8 of Gemcitabine  today.  --labs from today were reviewed. Labs show white blood cell count 7.9, Hgb 8.9, MCV 87.7, Plt 227. Creatinine and LFTs normal.  --No prohibitive  toxicities identified so proceed with treatment without any dose modification --RTC for labs and visit prior to Cycle 2, Day 1  #Leukocytosis-neutrophil predominant: --Patient denies any infectious symptoms, elevation is likely secondary to growth factor --Held GCSF injection with Cycle 4 of Ram/Docetaxel . Continue to hold injection for future cycles.   #Left lower eyelid stye--resolved: --Applying warm compresses, recommend to continue --Completed 7 day course of doxycycline  100 mg BID.   #Left sided low back pain with sciatica: --History of lumbar spondylosis and moderate to severe facet arthrosis without spinal stenosis and mild right neural foraminal stenosis at L4-5 seen on MRI lumbar 04/24/2020.    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  --Pain is slightly improved but still present.  --Continue to take tramadol  q 6 hours for pain control.   #Hyponatremia-improved --Chronic in nature --Sodium level dropped from 127 on  08/09/2022 to 118.  Today sodium stable to 132. --Etiologies including SIADH, immunotherapy induced, too much water intake.  --Patient is asymptomatic without nausea/vomiting, headaches, confusion, seizures, etc --Currently on salt tablets 1gm three times daily.  --Strict ED precautions given for any new symptoms as discussed above.   # Iron Deficiency Anemia #Chemotherapy induced anemia --Currently on ferrous sulfate  325 mg PO daily --Hgb is 8.9 today --No evidence of iron/b12/folate deficiency per labs form 04/22/2023.   #Pain Control --stopped oxycodone  due to itching --currently following with Palliative care.  --pain well controlled with tramadol  as needed. Continue to take tramdol 50-100 mg q 6 hours as needed. --Continue senna docusate and miralax  for constipation prophylaxis --Continue to monitor  #Supportive Care -- chemotherapy education complete -- port placed -- zofran  8mg  q8H PRN and compazine  10mg  PO q6H for nausea -- EMLA  cream for port -- Patient referred to palliative care for pain control. Pain control as above.    No orders of the defined types were placed in this encounter.   All questions were answered. The patient knows to call the clinic with any problems, questions or concerns.  I have spent a total of 30 minutes minutes of face-to-face and non-face-to-face time, preparing to see the patient, performing a medically appropriate examination, counseling and educating the patient, referring and communicating with other health care professionals, documenting clinical information in the electronic health record, and care coordination.   Wyline Hearing PA-C Dept of Hematology and Oncology [facility] Phone: [phone]     05/04/2023 10:56 AM

## 2023-05-04 NOTE — Patient Instructions (Signed)
 [facility] - A DEPT OF [facility]  Discharge Instructions: Thank you for choosing [facility] to provide your oncology and hematology care.   If you have a lab appointment with the Cancer Center, please go directly to the Cancer Center and check in at the registration area.   Wear comfortable clothing and clothing appropriate for easy access to any Portacath or PICC line.   We strive to give you quality time with your provider. You may need to reschedule your appointment if you arrive late (15 or more minutes).  Arriving late affects you and other patients whose appointments are after yours.  Also, if you miss three or more appointments without notifying the office, you may be dismissed from the clinic at the provider's discretion.      For prescription refill requests, have your pharmacy contact our office and allow 72 hours for refills to be completed.    Today you received the following chemotherapy and/or immunotherapy agents Gemzar      To help prevent nausea and vomiting after your treatment, we encourage you to take your nausea medication as directed.  BELOW ARE SYMPTOMS THAT SHOULD BE REPORTED IMMEDIATELY: *FEVER GREATER THAN 100.4 F (38 C) OR HIGHER *CHILLS OR SWEATING *NAUSEA AND VOMITING THAT IS NOT CONTROLLED WITH YOUR NAUSEA MEDICATION *UNUSUAL SHORTNESS OF BREATH *UNUSUAL BRUISING OR BLEEDING *URINARY PROBLEMS (pain or burning when urinating, or frequent urination) *BOWEL PROBLEMS (unusual diarrhea, constipation, pain near the anus) TENDERNESS IN MOUTH AND THROAT WITH OR WITHOUT PRESENCE OF ULCERS (sore throat, sores in mouth, or a toothache) UNUSUAL RASH, SWELLING OR PAIN  UNUSUAL VAGINAL DISCHARGE OR ITCHING   Items with * indicate a potential emergency and should be followed up as soon as possible or go to the Emergency Department if any problems should occur.  Please show the CHEMOTHERAPY ALERT CARD or IMMUNOTHERAPY  ALERT CARD at check-in to the Emergency Department and triage nurse.  Should you have questions after your visit or need to cancel or reschedule your appointment, please contact [facility] - A DEPT OF [facility]  Dept: [phone]  and follow the prompts.  Office hours are 8:00 a.m. to 4:30 p.m. Monday - Friday. Please note that voicemails left after 4:00 p.m. may not be returned until the following business day.  We are closed weekends and major holidays. You have access to a nurse at all times for urgent questions. Please call the main number to the clinic Dept: [phone] and follow the prompts.   For any non-urgent questions, you may also contact your provider using MyChart. We now offer e-Visits for anyone 54 and older to request care online for non-urgent symptoms. For details visit [email or web address].   Also download the MyChart app! Go to the app store, search "MyChart", open the app, select [place], and log in with your MyChart username and password.

## 2023-05-07 ENCOUNTER — Other Ambulatory Visit: Payer: Self-pay

## 2023-05-11 ENCOUNTER — Other Ambulatory Visit

## 2023-05-11 ENCOUNTER — Ambulatory Visit: Admitting: Physician Assistant

## 2023-05-11 ENCOUNTER — Ambulatory Visit

## 2023-05-11 ENCOUNTER — Inpatient Hospital Stay (HOSPITAL_BASED_OUTPATIENT_CLINIC_OR_DEPARTMENT_OTHER): Admitting: Nurse Practitioner

## 2023-05-11 ENCOUNTER — Telehealth: Payer: Self-pay

## 2023-05-11 ENCOUNTER — Encounter: Payer: Self-pay | Admitting: Nurse Practitioner

## 2023-05-11 ENCOUNTER — Inpatient Hospital Stay

## 2023-05-11 VITALS — BP 133/81 | HR 98 | Temp 97.9°F | Resp 16 | Ht 69.0 in | Wt 145.8 lb

## 2023-05-11 DIAGNOSIS — Z5111 Encounter for antineoplastic chemotherapy: Secondary | ICD-10-CM | POA: Diagnosis not present

## 2023-05-11 DIAGNOSIS — C7951 Secondary malignant neoplasm of bone: Secondary | ICD-10-CM

## 2023-05-11 DIAGNOSIS — C3490 Malignant neoplasm of unspecified part of unspecified bronchus or lung: Secondary | ICD-10-CM

## 2023-05-11 DIAGNOSIS — G893 Neoplasm related pain (acute) (chronic): Secondary | ICD-10-CM | POA: Diagnosis not present

## 2023-05-11 DIAGNOSIS — Z95828 Presence of other vascular implants and grafts: Secondary | ICD-10-CM

## 2023-05-11 DIAGNOSIS — Z515 Encounter for palliative care: Secondary | ICD-10-CM

## 2023-05-11 DIAGNOSIS — C3412 Malignant neoplasm of upper lobe, left bronchus or lung: Secondary | ICD-10-CM

## 2023-05-11 DIAGNOSIS — D649 Anemia, unspecified: Secondary | ICD-10-CM

## 2023-05-11 DIAGNOSIS — R530 Neoplastic (malignant) related fatigue: Secondary | ICD-10-CM | POA: Diagnosis not present

## 2023-05-11 LAB — CMP (CANCER CENTER ONLY)
ALT: 13 U/L (ref 0–44)
AST: 16 U/L (ref 15–41)
Albumin: 3.2 g/dL — ABNORMAL LOW (ref 3.5–5.0)
Alkaline Phosphatase: 64 U/L (ref 38–126)
Anion gap: 4 — ABNORMAL LOW (ref 5–15)
BUN: 6 mg/dL — ABNORMAL LOW (ref 8–23)
CO2: 28 mmol/L (ref 22–32)
Calcium: 8.7 mg/dL — ABNORMAL LOW (ref 8.9–10.3)
Chloride: 100 mmol/L (ref 98–111)
Creatinine: 0.79 mg/dL (ref 0.61–1.24)
GFR, Estimated: >60 mL/min (ref >60)
Glucose, Bld: 86 mg/dL (ref 70–99)
Potassium: 4.2 mmol/L (ref 3.5–5.1)
Sodium: 132 mmol/L — ABNORMAL LOW (ref 135–145)
Total Bilirubin: 0.3 mg/dL (ref 0.0–1.2)
Total Protein: 6.3 g/dL — ABNORMAL LOW (ref 6.5–8.1)

## 2023-05-11 LAB — CBC WITH DIFFERENTIAL (CANCER CENTER ONLY)
Abs Immature Granulocytes: 0.02 10*3/uL (ref 0.00–0.07)
Basophils Absolute: 0 10*3/uL (ref 0.0–0.1)
Basophils Relative: 1 %
Eosinophils Absolute: 0.2 10*3/uL (ref 0.0–0.5)
Eosinophils Relative: 6 %
HCT: 25.5 % — ABNORMAL LOW (ref 39.0–52.0)
Hemoglobin: 8.2 g/dL — ABNORMAL LOW (ref 13.0–17.0)
Immature Granulocytes: 1 %
Lymphocytes Relative: 23 %
Lymphs Abs: 0.8 10*3/uL (ref 0.7–4.0)
MCH: 28.5 pg (ref 26.0–34.0)
MCHC: 32.2 g/dL (ref 30.0–36.0)
MCV: 88.5 fL (ref 80.0–100.0)
Monocytes Absolute: 0.2 10*3/uL (ref 0.1–1.0)
Monocytes Relative: 6 %
Neutro Abs: 2.2 10*3/uL (ref 1.7–7.7)
Neutrophils Relative %: 63 %
Platelet Count: 116 10*3/uL — ABNORMAL LOW (ref 150–400)
RBC: 2.88 MIL/uL — ABNORMAL LOW (ref 4.22–5.81)
RDW: 23.5 % — ABNORMAL HIGH (ref 11.5–15.5)
WBC Count: 3.5 10*3/uL — ABNORMAL LOW (ref 4.0–10.5)
nRBC: 0.9 % — ABNORMAL HIGH (ref 0.0–0.2)

## 2023-05-11 LAB — SAMPLE TO BLOOD BANK

## 2023-05-11 MED ORDER — TRAMADOL HCL 50 MG PO TABS: 50.0000 mg | ORAL_TABLET | Freq: Four times a day (QID) | ORAL | 0 refills | Status: DC | PRN

## 2023-05-11 MED ORDER — SODIUM CHLORIDE 0.9% FLUSH
10.0000 mL | INTRAVENOUS | Status: DC | PRN
Start: 2023-05-11 — End: 2023-05-11
  Administered 2023-05-11: 10 mL

## 2023-05-11 MED ORDER — SODIUM CHLORIDE 0.9 % IV SOLN
1000.0000 mg/m2 | Freq: Once | INTRAVENOUS | Status: AC
  Administered 2023-05-11: 1824 mg via INTRAVENOUS
  Filled 2023-05-11: qty 47.97

## 2023-05-11 MED ORDER — PROCHLORPERAZINE MALEATE 10 MG PO TABS
10.0000 mg | ORAL_TABLET | Freq: Once | ORAL | Status: AC
  Administered 2023-05-11: 10 mg via ORAL
  Filled 2023-05-11: qty 1

## 2023-05-11 MED ORDER — SODIUM CHLORIDE 0.9% FLUSH
10.0000 mL | Freq: Once | INTRAVENOUS | Status: AC
  Administered 2023-05-11: 10 mL

## 2023-05-11 MED ORDER — HEPARIN SOD (PORK) LOCK FLUSH 100 UNIT/ML IV SOLN
500.0000 [IU] | Freq: Once | INTRAVENOUS | Status: AC | PRN
  Administered 2023-05-11: 500 [IU]

## 2023-05-11 MED ORDER — SODIUM CHLORIDE 0.9 % IV SOLN: INTRAVENOUS | Status: DC

## 2023-05-11 NOTE — Patient Instructions (Signed)
 [facility] - A DEPT OF [facility]  Discharge Instructions: Thank you for choosing [facility] to provide your oncology and hematology care.   If you have a lab appointment with the Cancer Center, please go directly to the Cancer Center and check in at the registration area.   Wear comfortable clothing and clothing appropriate for easy access to any Portacath or PICC line.   We strive to give you quality time with your provider. You may need to reschedule your appointment if you arrive late (15 or more minutes).  Arriving late affects you and other patients whose appointments are after yours.  Also, if you miss three or more appointments without notifying the office, you may be dismissed from the clinic at the provider's discretion.      For prescription refill requests, have your pharmacy contact our office and allow 72 hours for refills to be completed.    Today you received the following chemotherapy and/or immunotherapy agents: Gemcitabine.       To help prevent nausea and vomiting after your treatment, we encourage you to take your nausea medication as directed.  BELOW ARE SYMPTOMS THAT SHOULD BE REPORTED IMMEDIATELY: *FEVER GREATER THAN 100.4 F (38 C) OR HIGHER *CHILLS OR SWEATING *NAUSEA AND VOMITING THAT IS NOT CONTROLLED WITH YOUR NAUSEA MEDICATION *UNUSUAL SHORTNESS OF BREATH *UNUSUAL BRUISING OR BLEEDING *URINARY PROBLEMS (pain or burning when urinating, or frequent urination) *BOWEL PROBLEMS (unusual diarrhea, constipation, pain near the anus) TENDERNESS IN MOUTH AND THROAT WITH OR WITHOUT PRESENCE OF ULCERS (sore throat, sores in mouth, or a toothache) UNUSUAL RASH, SWELLING OR PAIN  UNUSUAL VAGINAL DISCHARGE OR ITCHING   Items with * indicate a potential emergency and should be followed up as soon as possible or go to the Emergency Department if any problems should occur.  Please show the CHEMOTHERAPY ALERT CARD or  IMMUNOTHERAPY ALERT CARD at check-in to the Emergency Department and triage nurse.  Should you have questions after your visit or need to cancel or reschedule your appointment, please contact [facility] - A DEPT [facility]  Dept: [phone]  and follow the prompts.  Office hours are 8:00 a.m. to 4:30 p.m. Monday - Friday. Please note that voicemails left after 4:00 p.m. may not be returned until the following business day.  We are closed weekends and major holidays. You have access to a nurse at all times for urgent questions. Please call the main number to the clinic Dept: [phone] and follow the prompts.   For any non-urgent questions, you may also contact your provider using MyChart. We now offer e-Visits for anyone 19 and older to request care online for non-urgent symptoms. For details visit [email or web address].   Also download the MyChart app! Go to the app store, search "MyChart", open the app, select [facility], and log in with your MyChart username and password.

## 2023-05-11 NOTE — Progress Notes (Signed)
 Per lab tech, ANC 2.2 today.

## 2023-05-11 NOTE — Addendum Note (Signed)
 Addended by: PICKENPACK-COUSAR, Kaelie Henigan N on: 05/11/2023 02:03 PM   Modules accepted: Orders

## 2023-05-11 NOTE — Telephone Encounter (Signed)
 Pt called for refill of tramadol  and dexamethasone , see orders.

## 2023-05-11 NOTE — Progress Notes (Signed)
 Palliative Medicine [facility]  [phone] Fax:([phone]) [phone]   Name: Terry Page. Date: 05/11/2023 MRN: [id number]  DOB: 1955/05/09  Patient Care Team: Dorena Gander, MD as PCP - General (Family Medicine)    INTERVAL HISTORY: Terry Page. is a 68 y.o. 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 He is currently undergoing chemotherapy. Palliative ask to see for symptom management.   SOCIAL HISTORY:     reports that he quit smoking about 3 years ago. His smoking use included cigarettes. He has never used smokeless tobacco. He reports current alcohol use of about 12.0 standard drinks of alcohol per week. He reports that he does not use drugs.  ADVANCE DIRECTIVES:  None on file   CODE STATUS: Full code  PAST MEDICAL HISTORY: Past Medical History:  Diagnosis Date   Alcohol abuse    Asthma    Bullous emphysema ([name])    Essential hypertension 04/10/2020   Hemorrhoids    History of radiation therapy 04/08/20-04/19/20   IMRT- Left lung- Dr. Retta Caster   Incidental lung nodule, greater than or equal to 8mm 10/22/2017   Left upper lobe - discovered on CTA   Prostate cancer ([name])    Spontaneous pneumothorax 10/20/2017   right   Tobacco abuse     ALLERGIES:  is allergic to morphine  and oxycodone .  MEDICATIONS:  Current Outpatient Medications  Medication Sig Dispense Refill   acetaminophen  (TYLENOL ) 325 MG tablet Take 2 tablets (650 mg total) by mouth every 6 (six) hours as needed for mild pain or headache (fever >/= 101).     albuterol  (VENTOLIN  HFA) 108 (90 Base) MCG/ACT inhaler Inhale 2 puffs into the lungs every 6 (six) hours as needed for wheezing or shortness of breath.     [Paused] amLODipine  (NORVASC ) 10 MG tablet Take 1 tablet by mouth daily.     aspirin  EC 325 MG tablet  Take 1 tablet (325 mg total) by mouth daily. 30 tablet 1   atorvastatin  (LIPITOR) 40 MG tablet Take 1 tablet (40 mg total) by mouth daily. 30 tablet 1   cyanocobalamin  (VITAMIN B12) 1000 MCG tablet Take 1 tablet (1,000 mcg total) by mouth daily. (Patient taking differently: Take 1,000 mcg by mouth as needed (When directed by physican).) 90 tablet 1   dronabinol  (MARINOL ) 5 MG capsule Take 1 capsule (5 mg total) by mouth 2 (two) times daily before lunch and supper. 60 capsule 0   ferrous sulfate  325 (65 FE) MG EC tablet Take 1 tablet (325 mg total) by mouth daily with breakfast. 30 tablet 3   folic acid  (FOLVITE ) 1 MG tablet Take 1 tablet (1 mg total) by mouth daily. 90 tablet 3   hydrOXYzine  (ATARAX ) 25 MG tablet Take 1 tablet (25 mg total) by mouth at bedtime as needed for itching. 30 tablet 11   ipratropium-albuterol  (DUONEB) 0.5-2.5 (3) MG/3ML SOLN Take 3 mLs by nebulization every 6 (six) hours as needed. (Patient taking differently: Take 3 mLs by nebulization every 6 (six) hours as needed (shortness of breathing or wheezing).) 360 mL 0   lidocaine  (LIDODERM ) 5 % Place 1 patch onto the skin daily. Remove & Discard patch within 12 hours or as directed by MD. Apply to low back 30 patch 0   lidocaine -prilocaine  (EMLA ) cream Apply 1 Application topically as needed. 30 g 0   [  Paused] olmesartan (BENICAR) 40 MG tablet Take 40 mg by mouth daily.     polyethylene glycol (MIRALAX ) 17 g packet Take 17 g by mouth daily. 30 each 2   potassium chloride  SA (KLOR-CON  M) 20 MEQ tablet TAKE 1 TABLET(20 MEQ) BY MOUTH DAILY 90 tablet 0   SENNA S 8.6-50 MG tablet Take 2 tablets by mouth at bedtime as needed for mild constipation or moderate constipation.     senna-docusate (STIMULANT LAXATIVE) 8.6-50 MG tablet Take 2 tablets by mouth 2 (two) times daily. May add additional tablet as needed 150 tablet 3   sodium chloride  1 g tablet Take 1 tablet (1 g total) by mouth 3 (three) times daily with meals. 90 tablet 3    thiamine  100 MG tablet Take 1 tablet (100 mg total) by mouth daily.     traMADol  (ULTRAM ) 50 MG tablet Take 1-2 tablets (50-100 mg total) by mouth every 6 (six) hours as needed. 120 tablet 0   No current facility-administered medications for this visit.   Facility-Administered Medications Ordered in Other Visits  Medication Dose Route Frequency Provider Last Rate Last Admin   0.9 %  sodium chloride  infusion   Intravenous Continuous Dorsey, John T IV, MD   Stopped at 05/11/23 1155   sodium chloride  flush (NS) 0.9 % injection 10 mL  10 mL Intracatheter PRN Rogerio Clay IV, MD   10 mL at 05/11/23 1150    VITAL SIGNS: There were no vitals taken for this visit. There were no vitals filed for this visit.   Estimated body mass index is 21.52 kg/m as calculated from the following:   Height as of an earlier encounter on 05/11/23: 5\' 9"  (1.753 m).   Weight as of an earlier encounter on 05/11/23: 145 lb 12 oz (66.1 kg).   PERFORMANCE STATUS (ECOG) : 1 - Symptomatic but completely ambulatory  Physical Exam General: Fatigue  Cardiovascular: Regular rate and rhythm Pulmonary: Diminished bilaterally Extremities: no edema, no joint deformities, weakness Skin: no rashes, dry  Neurological: AAO x3  IMPRESSION: Discussed the use of AI scribe software for clinical note transcription with the patient, who gave verbal consent to proceed.  History of Present Illness Terry Page. is a 68 year old male  with metastatic lung cancer with bone involvement who presents for follow-up. He reports bilateral lower extremity swelling specifically in his feet. This seems to have worsened overtime. States his providers are aware and a nurse is assisting with care. Patient shows a picture of healing ulceration to right heel. His son is dressing daily for him. Patient is unable to wear shoes due to increase in edema. Some tenderness. Additionally, he mentions using boots to elevate heels when resting in recliner  or bed at home. Reports occasional fatigue and nausea after initial treatment several weeks ago. Feels this has improved after each treatment.   Feels his pain is well-controlled with use of tramadol  50-100 mg as needed in addition to lidocaine  patches.  No adjustments to current regimen.  I discussed the importance of continued conversation with family and their medical providers regarding overall plan of care and treatment options, ensuring decisions are within the context of the patients values and GOCs. Assessment & Plan Cancer related pain/Back pain Reports pain is well controlled. No adjustments at this time.  - Tramadol  50-100mg  every 6 hours as needed -Lidocaine  patch to back as needed.  Swelling in bilateral feet Swelling in right heel improving. Son is wrapping heel ulceration.  -  Maintain close follow-up with medical team as needed.   I will plan to see patient back in 3-4 weeks. Sooner if needed.   Patient expressed understanding and was in agreement with this plan. He also understands that He can call the clinic at any time with any questions, concerns, or complaints.   Any controlled substances utilized were prescribed in the context of palliative care. PDMP has been reviewed.   Visit consisted of counseling and education dealing with the complex and emotionally intense issues of symptom management and palliative care in the setting of serious and potentially life-threatening illness.  Dellia Ferguson, AGPCNP-BC  Palliative Medicine Team/[facility]

## 2023-05-14 ENCOUNTER — Other Ambulatory Visit (HOSPITAL_COMMUNITY): Payer: Self-pay

## 2023-05-14 ENCOUNTER — Encounter: Payer: Self-pay | Admitting: Hematology and Oncology

## 2023-05-14 ENCOUNTER — Other Ambulatory Visit: Payer: Self-pay | Admitting: Physician Assistant

## 2023-05-14 NOTE — Telephone Encounter (Signed)
 Pt advised and voiced understanding.

## 2023-05-18 ENCOUNTER — Encounter: Payer: Self-pay | Admitting: Hematology and Oncology

## 2023-05-18 ENCOUNTER — Ambulatory Visit: Admitting: Physician Assistant

## 2023-05-18 ENCOUNTER — Other Ambulatory Visit: Payer: Self-pay | Admitting: Physician Assistant

## 2023-05-18 ENCOUNTER — Ambulatory Visit

## 2023-05-18 ENCOUNTER — Other Ambulatory Visit

## 2023-05-18 DIAGNOSIS — C3412 Malignant neoplasm of upper lobe, left bronchus or lung: Secondary | ICD-10-CM

## 2023-05-24 NOTE — Progress Notes (Signed)
 [facility] [phone]   [phone]) [phone]  PROGRESS NOTE  Patient Care Team: Dorena Gander, MD as PCP - General (Family Medicine)  Hematological/Oncological History # Metastatic Adenocarcinoma of the Lung  01/07/2020 : CT of the chest with contrast performed which showed interval enlargement of a spiculated nodule in the central left upper lobe measuring 1.2 x 1.0 cm and highly suspicious for primary lung malignancy 02/06/2020 :PET scan performed which showed a hypermetabolic irregular solid 1.3 cm left upper lobe pulmonary nodule compatible with malignancy without any other hypermetabolic lesions 6/0/1093- 04/19/2020:  SBRT to the lung lesion 04/28/2021: CT C/A/P showed multifocal lytic bone metastases are identified. Lesion within the spine of the left scapula and lesions involving bilateral iliac bones and left sacral wing. Establish care with Dr. Rosaline Coma  05/03/2021: biopsy of right iliac lytic lesion showed metastatic carcinoma, consistent with lung primary.  07/08/2021: Cycle 1 Day 1 of Carbo/Pem/Pem 07/29/2021: Cycle 2 Day 1 of Carbo/Pem/Pem 08/11/2021-08/24/2021: Received palliative radiation to osseous metastasis in the left shoulder. 30 Gy in 10 fractions.  08/19/2021: Cycle 3 Day 1 of Carbo/Pem/Pem 09/09/2021: Cycle 4 Day 1 of Carbo/Pem/Pem 09/23/2021: CT CAP: stable disease 09/30/2021: Cycle 5 Day 1 of Pem/Pem 10/21/2021: Cycle 6 Day 1 of Pembrolizumab . Held pemetrexed  due to anemia. 11/16/2021: Cycle 7 Day 1 of Pem/Pem.   12/16/2021: Cycle 8 Day 1 of Pem/Pem.   01/06/2022: Cycle 9 Day 1 of Pem/Pem.  01/27/2022: Cycle 10 Day 1 of Pem/Pem 02/17/2022: Cycle 11 Day 1 of Pem/Pem 03/10/2022: Cycle 12 Day 1 of Pem/Pem (HELD due to anemia with Hgb of 6.4) 03/17/2022: Cycle 12 Day 1 of Pem/Pem (HELD pemextrexed due to cytopenias/kidney function) 04/10/2022: Cycle 13 Day 1 of Pem/Pem (HELD pemextrexed due to cytopenias/kidney function) 04/28/2022: Cycle 14 Day 1 of Pem/Pem  (HELD pemextrexed due to cytopenias/kidney function) 05/23/2022:  Cycle 15 Day 1 of Pem/Pem (HELD pemextrexed due to cytopenias/kidney function) 06/15/2022: Cycle 16 Day 1 of Pembrolizumab .  07/07/2022: Cycle 17 Day 1 of Pembrolizumab .  07/28/2022: Cycle 18 Day 1 of Pembrolizumab  08/18/2022: Cycle 19 Day 1 of Pembrolizumab  HELD due to hyponatremia of 118, new bone metastases. 08/25/2022: Cycle 19 Day 1 of Pembrolizumab  08/28/2022-09/13/2022: Received palliative radiation to right pelvis with total dose of 30 Gy in 10 Fx.  09/22/2022:  Cycle 20 Day 1 of Pembrolizumab  10/13/2022:  Cycle 21 Day 1 of Pembrolizumab  12/11/2022: Cycle 1 Day 1 of Docetaxel /Ramucircumab.  01/05/2023: Cycle 2 Day 1 of Docetaxel /Ramucircumab.  01/26/2023: Cycle 3 Day 1 of Docetaxel /Ramucircumab 02/16/2023:  Cycle 4 Day 1 of Docetaxel /Ramucircumab 03/09/2023: Cycle 5 Day 1 of Docetaxel /Ramucircumab 03/30/2023:  Cycle 6 Day 1 of Docetaxel /Ramucircumab 04/20/2023-04/23/2023: Admitted for slurred speech and weakness. MRI brain concerning for stroke. CTA chest found progression of irregular consolidation in the left apex.  04/27/2023: Cycle 1, Day 1 of Gemcitabine  05/25/2023: Cycle 2, Day 1 of Gemcitabine    #Adenocarcinoma of the Prostate, T1cN0M0 08/19/2019-10/07/2019: 70 Gy in 28 fractions of 2.5 Gy.  Radiation to the prostate was under the care of Dr. Kenith Payer  Interval History:  Terry Page. 68 y.o. male with medical history significant for metastatic adenocarcinoma of the lung who presents for a follow up visit. The patient's last visit was on 05/04/2023. In the interim since the last visit, he has continued on single agent gemcitabine .  On exam today, [name] reports he has been doing okay on gemcitabine  treatment.  He reports he does feel fatigued the day of treatment but otherwise  feels quite well.  He is not having any lightheadedness or dizziness but does have some occasional shortness of breath.  Is not causing any  nausea, vomiting, or diarrhea.  He reports his appetite remains strong and his weight is steady.  He notes his energy today is about a 7.5 out of 10.  He reports that he is not having any trouble with bleeding, bruising, or dark stools.  He is working with physical therapy in order to help build up his strength.  He reports he is doing his best to try to eat beef and red meat as well as carbohydrates such as pasta and rice.  He has no other questions concerns or complaints today.  A full 10 point ROS is otherwise negative.  MEDICAL HISTORY:  Past Medical History:  Diagnosis Date   Alcohol abuse    Asthma    Bullous emphysema ([name])    Essential hypertension 04/10/2020   Hemorrhoids    History of radiation therapy 04/08/20-04/19/20   IMRT- Left lung- Dr. Retta Caster   Incidental lung nodule, greater than or equal to 8mm 10/22/2017   Left upper lobe - discovered on CTA   Prostate cancer ([name])    Spontaneous pneumothorax 10/20/2017   right   Tobacco abuse     SURGICAL HISTORY: Past Surgical History:  Procedure Laterality Date   BACK SURGERY     IR IMAGING GUIDED PORT INSERTION  06/29/2021   PROSTATE BIOPSY      SOCIAL HISTORY: Social History   Socioeconomic History   Marital status: Single    Spouse name: Not on file   Number of children: Not on file   Years of education: Not on file   Highest education level: Not on file  Occupational History   Occupation: retired  Tobacco Use   Smoking status: Former    Current packs/day: 0.00    Types: Cigarettes    Quit date: 11/22/2019    Years since quitting: 3.5   Smokeless tobacco: Never   Tobacco comments:    Patient reports quit 3 years ago. 06/25/20. [facility]  Vaping Use   Vaping status: Never Used  Substance and Sexual Activity   Alcohol use: Yes    Alcohol/week: 12.0 standard drinks of alcohol    Types: 12 Cans of beer per week    Comment: daily sometimes   Drug use: No   Sexual activity: Not Currently  Other Topics Concern    Not on file  Social History Narrative   Not on file   Social Drivers of Health   Financial Resource Strain: Not on file  Food Insecurity: No Food Insecurity (04/22/2023)   Hunger Vital Sign    Worried About Running Out of Food in the Last Year: Never true    Ran Out of Food in the Last Year: Never true  Transportation Needs: No Transportation Needs (04/22/2023)   PRAPARE - Administrator, Civil Service (Medical): No    Lack of Transportation (Non-Medical): No  Recent Concern: Transportation Needs - Unmet Transportation Needs (04/21/2023)   PRAPARE - Administrator, Civil Service (Medical): Yes    Lack of Transportation (Non-Medical): No  Physical Activity: Not on file  Stress: Not on file  Social Connections: Moderately Integrated (04/21/2023)   Social Connection and Isolation Panel [NHANES]    Frequency of Communication with Friends and Family: More than three times a week    Frequency of Social Gatherings with Friends and Family: More than  three times a week    Attends Religious Services: 1 to 4 times per year    Active Member of Clubs or Organizations: No    Attends Banker Meetings: Never    Marital Status: Married  Catering manager Violence: Not At Risk (04/22/2023)   Humiliation, Afraid, Rape, and Kick questionnaire    Fear of Current or Ex-Partner: No    Emotionally Abused: No    Physically Abused: No    Sexually Abused: No    FAMILY HISTORY: Family History  Problem Relation Age of Onset   Cancer Cousin        maternal cousin   Cancer Cousin        paternal cousin   Cancer Cousin    Colon polyps Neg Hx    Pancreatic disease Neg Hx    Pancreatic cancer Neg Hx    Breast cancer Neg Hx    Colon cancer Neg Hx     ALLERGIES:  is allergic to morphine  and oxycodone .  MEDICATIONS:  Current Outpatient Medications  Medication Sig Dispense Refill   acetaminophen  (TYLENOL ) 325 MG tablet Take 2 tablets (650 mg total) by mouth every 6  (six) hours as needed for mild pain or headache (fever >/= 101).     albuterol  (VENTOLIN  HFA) 108 (90 Base) MCG/ACT inhaler Inhale 2 puffs into the lungs every 6 (six) hours as needed for wheezing or shortness of breath.     [Paused] amLODipine  (NORVASC ) 10 MG tablet Take 1 tablet by mouth daily.     aspirin  EC 325 MG tablet Take 1 tablet (325 mg total) by mouth daily. 30 tablet 1   atorvastatin  (LIPITOR) 40 MG tablet Take 1 tablet (40 mg total) by mouth daily. 30 tablet 1   cyanocobalamin  (VITAMIN B12) 1000 MCG tablet Take 1 tablet (1,000 mcg total) by mouth daily. (Patient taking differently: Take 1,000 mcg by mouth as needed (When directed by physican).) 90 tablet 1   dronabinol  (MARINOL ) 5 MG capsule Take 1 capsule (5 mg total) by mouth 2 (two) times daily before lunch and supper. 60 capsule 0   ferrous sulfate  325 (65 FE) MG EC tablet Take 1 tablet (325 mg total) by mouth daily with breakfast. 30 tablet 3   folic acid  (FOLVITE ) 1 MG tablet Take 1 tablet (1 mg total) by mouth daily. 90 tablet 3   hydrOXYzine  (ATARAX ) 25 MG tablet Take 1 tablet (25 mg total) by mouth at bedtime as needed for itching. 30 tablet 11   ipratropium-albuterol  (DUONEB) 0.5-2.5 (3) MG/3ML SOLN Take 3 mLs by nebulization every 6 (six) hours as needed. (Patient taking differently: Take 3 mLs by nebulization every 6 (six) hours as needed (shortness of breathing or wheezing).) 360 mL 0   lidocaine  (LIDODERM ) 5 % Place 1 patch onto the skin daily. Remove & Discard patch within 12 hours or as directed by MD. Apply to low back 30 patch 0   lidocaine -prilocaine  (EMLA ) cream Apply 1 Application topically as needed. 30 g 0   [Paused] olmesartan (BENICAR) 40 MG tablet Take 40 mg by mouth daily.     polyethylene glycol (MIRALAX ) 17 g packet Take 17 g by mouth daily. 30 each 2   potassium chloride  SA (KLOR-CON  M) 20 MEQ tablet TAKE 1 TABLET(20 MEQ) BY MOUTH DAILY 90 tablet 0   SENNA S 8.6-50 MG tablet Take 2 tablets by mouth at  bedtime as needed for mild constipation or moderate constipation.     senna-docusate (STIMULANT LAXATIVE) 8.6-50  MG tablet Take 2 tablets by mouth 2 (two) times daily. May add additional tablet as needed 150 tablet 3   sodium chloride  1 g tablet Take 1 tablet (1 g total) by mouth 3 (three) times daily with meals. 90 tablet 3   thiamine  100 MG tablet Take 1 tablet (100 mg total) by mouth daily.     traMADol  (ULTRAM ) 50 MG tablet Take 1-2 tablets (50-100 mg total) by mouth every 6 (six) hours as needed. 120 tablet 0   No current facility-administered medications for this visit.    REVIEW OF SYSTEMS:   Constitutional: ( - ) fevers, ( - )  chills , ( - ) night sweats Eyes: ( - ) blurriness of vision, ( - ) double vision, ( - ) watery eyes Ears, nose, mouth, throat, and face: ( - ) mucositis, ( - ) sore throat Respiratory: ( - ) cough, ( - ) dyspnea, ( - ) wheezes Cardiovascular: ( - ) palpitation, ( - ) chest discomfort, ( - ) lower extremity swelling Gastrointestinal:  ( - ) nausea, ( - ) heartburn, ( - ) change in bowel habits Skin: ( - ) abnormal skin rashes Lymphatics: ( - ) new lymphadenopathy, ( - ) easy bruising Neurological: ( - ) numbness, ( - ) tingling, ( - ) new weaknesses Behavioral/Psych: ( - ) mood change, ( - ) new changes  All other systems were reviewed with the patient and are negative.  PHYSICAL EXAMINATION: ECOG PERFORMANCE STATUS: 1 - Symptomatic but completely ambulatory  Vitals:   05/25/23 1116  BP: (!) 156/89  Pulse: (!) 105  Resp: 16  Temp: (!) 97.4 F (36.3 C)  SpO2: 100%       Filed Weights   05/25/23 1116  Weight: 144 lb 9.6 oz (65.6 kg)     GENERAL: Well-appearing elderly African-American male, alert, no distress and comfortable SKIN: skin color, texture, turgor are normal, no rashes or significant lesions EYES: conjunctiva are pink and non-injected, sclera clear LUNGS: clear to auscultation and percussion with normal breathing effort HEART:  regular rate & rhythm and no murmurs and no lower extremity edema Musculoskeletal: no cyanosis of digits and no clubbing  PSYCH: alert & oriented x 3, fluent speech NEURO: no focal motor/sensory deficits  LABORATORY DATA:  I have reviewed the data as listed    Latest Ref Rng & Units 05/25/2023   10:48 AM 05/11/2023    9:39 AM 05/04/2023   10:20 AM  CBC  WBC 4.0 - 10.5 K/uL 11.3  3.5  7.9   Hemoglobin 13.0 - 17.0 g/dL 8.6  8.2  8.9   Hematocrit 39.0 - 52.0 % 26.3  25.5  27.1   Platelets 150 - 400 K/uL 578  116  227        Latest Ref Rng & Units 05/25/2023   10:48 AM 05/11/2023    9:39 AM 05/04/2023   10:20 AM  CMP  Glucose 70 - 99 mg/dL 409  86  811   BUN 8 - 23 mg/dL 9  6  12    Creatinine 0.61 - 1.24 mg/dL 9.14  7.82  9.56   Sodium 135 - 145 mmol/L 136  132  132   Potassium 3.5 - 5.1 mmol/L 4.3  4.2  4.4   Chloride 98 - 111 mmol/L 106  100  100   CO2 22 - 32 mmol/L 26  28  25    Calcium  8.9 - 10.3 mg/dL 8.9  8.7  9.2  Total Protein 6.5 - 8.1 g/dL 6.1  6.3  6.7   Total Bilirubin 0.0 - 1.2 mg/dL 0.2  0.3  0.3   Alkaline Phos 38 - 126 U/L 78  64  59   AST 15 - 41 U/L 14  16  14    ALT 0 - 44 U/L 11  13  15      Lab Results  Component Value Date   MPROTEIN Not Observed 04/28/2021   Lab Results  Component Value Date   KPAFRELGTCHN 33.6 (H) 04/28/2021   LAMBDASER 22.0 04/28/2021   KAPLAMBRATIO 1.53 04/28/2021    RADIOGRAPHIC STUDIES: No results found.   ASSESSMENT & PLAN Jassiah Viviano. Is a 68 y.o.  male with medical history significant for metastatic adenocarcinoma of the lung who presents for a follow up visit.  # Metastatic Adenocarcinoma of the Lung  -- NGS testing from this patient shows no evidence of targetable mutation. --MRI brain shows no evidence of intracranial metastases --Started carboplatin , pembrolizumab , and pemetrexed  as treatment for his cancer on 07/08/2021. --Received palliative radiation to osseous metastasis of the left shoulder from  08/11/2021-08/24/2021. Planned dose is 30 Gy in 10 fx.  --Discontinued Pemetrexed  on 03/17/2022 due to persistent anemia and kidney dysfunction. --Restaging CT CAP from 08/09/2022 shows interval enlargement of multiple mixed lytic and sclerotic bone metastases involving the right pelvis. Other bone metastases including of the left scapula, sacrum, and left hemipelvis are unchanged. Unchanged treated central LUL mass. Recommend to continue on pembrolizumab  therapy and arrange for palliative radiation to enlarging bone metastases.  --CTA chest from 04/20/2023 showed progression of disease to the consolidation in the left apex. Recommended to change therapy  to single agent Gemcitabine  3 weeks on, 1 week off.  --Started Cycle 1, Day 1 of Gemcitabine  on 04/27/2023 Plan: --Due for Cycle 2, Day 1 of Gemcitabine  today.  --labs from today were reviewed. Labs show white blood cell count 11.3, Hgb 8.6 ,MCV 90.4, Plt 578. Creatinine and LFTs normal.  --No prohibitive toxicities identified so proceed with treatment without any dose modification --RTC for labs and visit prior to Cycle 2, Day 15  #Leukocytosis-neutrophil predominant: --Patient denies any infectious symptoms, elevation is likely secondary to growth factor --Held GCSF injection with Cycle 4 of Ram/Docetaxel . Continue to hold injection for future cycles.   #Left lower eyelid stye--resolved: --Applying warm compresses, recommend to continue --Completed 7 day course of doxycycline  100 mg BID.   #Left sided low back pain with sciatica: --History of lumbar spondylosis and moderate to severe facet arthrosis without spinal stenosis and mild right neural foraminal stenosis at L4-5 seen on MRI lumbar 04/24/2020.   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  --Pain is slightly  improved but still present.  --Continue to take tramadol  q 6 hours for pain control.   #Hyponatremia-improved --Chronic in nature --Sodium level dropped from 127 on 08/09/2022 to 118.  Today sodium stable to 136 --Etiologies including SIADH, immunotherapy induced, too much water intake.  --Patient is asymptomatic without nausea/vomiting, headaches, confusion, seizures, etc --Currently on salt tablets 1gm three times daily.  --Strict ED precautions given for any new symptoms as discussed above.   # Iron Deficiency Anemia #Chemotherapy induced anemia --Currently on ferrous sulfate  325 mg PO daily --No evidence of iron/b12/folate deficiency per labs form 04/22/2023.   #Pain Control --stopped oxycodone   due to itching --currently following with Palliative care.  --pain well controlled with tramadol  as needed. Continue to take tramdol 50-100 mg q 6 hours as needed. --Continue senna docusate and miralax  for constipation prophylaxis --Continue to monitor  #Supportive Care -- chemotherapy education complete -- port placed -- zofran  8mg  q8H PRN and compazine  10mg  PO q6H for nausea -- EMLA  cream for port -- Patient referred to palliative care for pain control. Pain control as above.    No orders of the defined types were placed in this encounter.   All questions were answered. The patient knows to call the clinic with any problems, questions or concerns.  I have spent a total of 30 minutes minutes of face-to-face and non-face-to-face time, preparing to see the patient, performing a medically appropriate examination, counseling and educating the patient, referring and communicating with other health care professionals, documenting clinical information in the electronic health record, and care coordination.   Rogerio Clay, MD Department of Hematology/Oncology [facility] Phone: [phone] Pager: [phone] Email:  [email or web address]  05/26/2023 5:14 PM

## 2023-05-25 ENCOUNTER — Encounter: Payer: Self-pay | Admitting: Hematology and Oncology

## 2023-05-25 ENCOUNTER — Inpatient Hospital Stay (HOSPITAL_BASED_OUTPATIENT_CLINIC_OR_DEPARTMENT_OTHER): Admitting: Hematology and Oncology

## 2023-05-25 ENCOUNTER — Inpatient Hospital Stay

## 2023-05-25 ENCOUNTER — Ambulatory Visit (HOSPITAL_BASED_OUTPATIENT_CLINIC_OR_DEPARTMENT_OTHER): Admitting: Nurse Practitioner

## 2023-05-25 ENCOUNTER — Encounter: Payer: Self-pay | Admitting: Nurse Practitioner

## 2023-05-25 VITALS — BP 156/89 | HR 105 | Temp 97.4°F | Resp 16 | Wt 144.6 lb

## 2023-05-25 VITALS — HR 93

## 2023-05-25 DIAGNOSIS — Z515 Encounter for palliative care: Secondary | ICD-10-CM

## 2023-05-25 DIAGNOSIS — R530 Neoplastic (malignant) related fatigue: Secondary | ICD-10-CM

## 2023-05-25 DIAGNOSIS — C7951 Secondary malignant neoplasm of bone: Secondary | ICD-10-CM

## 2023-05-25 DIAGNOSIS — C3412 Malignant neoplasm of upper lobe, left bronchus or lung: Secondary | ICD-10-CM

## 2023-05-25 DIAGNOSIS — D649 Anemia, unspecified: Secondary | ICD-10-CM

## 2023-05-25 DIAGNOSIS — G893 Neoplasm related pain (acute) (chronic): Secondary | ICD-10-CM

## 2023-05-25 DIAGNOSIS — Z95828 Presence of other vascular implants and grafts: Secondary | ICD-10-CM

## 2023-05-25 DIAGNOSIS — C3490 Malignant neoplasm of unspecified part of unspecified bronchus or lung: Secondary | ICD-10-CM

## 2023-05-25 DIAGNOSIS — Z5111 Encounter for antineoplastic chemotherapy: Secondary | ICD-10-CM | POA: Diagnosis not present

## 2023-05-25 LAB — CBC WITH DIFFERENTIAL (CANCER CENTER ONLY)
Abs Immature Granulocytes: 0.4 10*3/uL — ABNORMAL HIGH (ref 0.00–0.07)
Basophils Absolute: 0.1 10*3/uL (ref 0.0–0.1)
Basophils Relative: 1 %
Eosinophils Absolute: 0.2 10*3/uL (ref 0.0–0.5)
Eosinophils Relative: 2 %
HCT: 26.3 % — ABNORMAL LOW (ref 39.0–52.0)
Hemoglobin: 8.6 g/dL — ABNORMAL LOW (ref 13.0–17.0)
Immature Granulocytes: 4 %
Lymphocytes Relative: 10 %
Lymphs Abs: 1.2 10*3/uL (ref 0.7–4.0)
MCH: 29.6 pg (ref 26.0–34.0)
MCHC: 32.7 g/dL (ref 30.0–36.0)
MCV: 90.4 fL (ref 80.0–100.0)
Monocytes Absolute: 1.7 10*3/uL — ABNORMAL HIGH (ref 0.1–1.0)
Monocytes Relative: 15 %
Neutro Abs: 7.8 10*3/uL — ABNORMAL HIGH (ref 1.7–7.7)
Neutrophils Relative %: 68 %
Platelet Count: 578 10*3/uL — ABNORMAL HIGH (ref 150–400)
RBC: 2.91 MIL/uL — ABNORMAL LOW (ref 4.22–5.81)
RDW: 24.8 % — ABNORMAL HIGH (ref 11.5–15.5)
WBC Count: 11.3 10*3/uL — ABNORMAL HIGH (ref 4.0–10.5)
nRBC: 0.2 % (ref 0.0–0.2)

## 2023-05-25 LAB — CMP (CANCER CENTER ONLY)
ALT: 11 U/L (ref 0–44)
AST: 14 U/L — ABNORMAL LOW (ref 15–41)
Albumin: 3 g/dL — ABNORMAL LOW (ref 3.5–5.0)
Alkaline Phosphatase: 78 U/L (ref 38–126)
Anion gap: 4 — ABNORMAL LOW (ref 5–15)
BUN: 9 mg/dL (ref 8–23)
CO2: 26 mmol/L (ref 22–32)
Calcium: 8.9 mg/dL (ref 8.9–10.3)
Chloride: 106 mmol/L (ref 98–111)
Creatinine: 0.74 mg/dL (ref 0.61–1.24)
GFR, Estimated: >60 mL/min (ref >60)
Glucose, Bld: 110 mg/dL — ABNORMAL HIGH (ref 70–99)
Potassium: 4.3 mmol/L (ref 3.5–5.1)
Sodium: 136 mmol/L (ref 135–145)
Total Bilirubin: 0.2 mg/dL (ref 0.0–1.2)
Total Protein: 6.1 g/dL — ABNORMAL LOW (ref 6.5–8.1)

## 2023-05-25 LAB — SAMPLE TO BLOOD BANK

## 2023-05-25 MED ORDER — PROCHLORPERAZINE MALEATE 10 MG PO TABS
10.0000 mg | ORAL_TABLET | Freq: Once | ORAL | Status: AC
  Administered 2023-05-25: 10 mg via ORAL
  Filled 2023-05-25: qty 1

## 2023-05-25 MED ORDER — SODIUM CHLORIDE 0.9 % IV SOLN
INTRAVENOUS | Status: DC
Start: 2023-05-25 — End: 2023-05-25

## 2023-05-25 MED ORDER — SODIUM CHLORIDE 0.9 % IV SOLN
1000.0000 mg/m2 | Freq: Once | INTRAVENOUS | Status: AC
  Administered 2023-05-25: 1824 mg via INTRAVENOUS
  Filled 2023-05-25: qty 47.97

## 2023-05-25 MED ORDER — SODIUM CHLORIDE 0.9% FLUSH
10.0000 mL | Freq: Once | INTRAVENOUS | Status: AC
  Administered 2023-05-25: 10 mL

## 2023-05-25 NOTE — Progress Notes (Signed)
 Palliative Medicine [facility]  [phone] [phone]   Name: Terry Page. Date: 05/25/2023 MRN: [id number]  DOB: [birth year]  Patient Care Team: Terry Gander, MD as PCP - General (Family Medicine)    INTERVAL HISTORY: Terry Page. is a 68 y.o. 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 He is currently undergoing chemotherapy. Palliative ask to see for symptom management.   SOCIAL HISTORY:     reports that he quit smoking about 3 years ago. His smoking use included cigarettes. He has never used smokeless tobacco. He reports current alcohol use of about 12.0 standard drinks of alcohol per week. He reports that he does not use drugs.  ADVANCE DIRECTIVES:  None on file   CODE STATUS: Full code  PAST MEDICAL HISTORY: Past Medical History:  Diagnosis Date   Alcohol abuse    Asthma    Bullous emphysema ([name])    Essential hypertension 04/10/2020   Hemorrhoids    History of radiation therapy 04/08/20-04/19/20   IMRT- Left lung- Dr. Retta Caster   Incidental lung nodule, greater than or equal to 8mm 10/22/2017   Left upper lobe - discovered on CTA   Prostate cancer ([name])    Spontaneous pneumothorax 10/20/2017   right   Tobacco abuse     ALLERGIES:  is allergic to morphine  and oxycodone .  MEDICATIONS:  Current Outpatient Medications  Medication Sig Dispense Refill   acetaminophen  (TYLENOL ) 325 MG tablet Take 2 tablets (650 mg total) by mouth every 6 (six) hours as needed for mild pain or headache (fever >/= 101).     albuterol  (VENTOLIN  HFA) 108 (90 Base) MCG/ACT inhaler Inhale 2 puffs into the lungs every 6 (six) hours as needed for wheezing or shortness of breath.     [Paused] amLODipine  (NORVASC ) 10 MG tablet Take 1 tablet by mouth daily.     aspirin  EC 325 MG tablet  Take 1 tablet (325 mg total) by mouth daily. 30 tablet 1   atorvastatin  (LIPITOR) 40 MG tablet Take 1 tablet (40 mg total) by mouth daily. 30 tablet 1   cyanocobalamin  (VITAMIN B12) 1000 MCG tablet Take 1 tablet (1,000 mcg total) by mouth daily. (Patient taking differently: Take 1,000 mcg by mouth as needed (When directed by physican).) 90 tablet 1   dronabinol  (MARINOL ) 5 MG capsule Take 1 capsule (5 mg total) by mouth 2 (two) times daily before lunch and supper. 60 capsule 0   ferrous sulfate  325 (65 FE) MG EC tablet Take 1 tablet (325 mg total) by mouth daily with breakfast. 30 tablet 3   folic acid  (FOLVITE ) 1 MG tablet Take 1 tablet (1 mg total) by mouth daily. 90 tablet 3   hydrOXYzine  (ATARAX ) 25 MG tablet Take 1 tablet (25 mg total) by mouth at bedtime as needed for itching. 30 tablet 11   ipratropium-albuterol  (DUONEB) 0.5-2.5 (3) MG/3ML SOLN Take 3 mLs by nebulization every 6 (six) hours as needed. (Patient taking differently: Take 3 mLs by nebulization every 6 (six) hours as needed (shortness of breathing or wheezing).) 360 mL 0   lidocaine  (LIDODERM ) 5 % Place 1 patch onto the skin daily. Remove & Discard patch within 12 hours or as directed by MD. Apply to low back 30 patch 0   lidocaine -prilocaine  (EMLA ) cream Apply 1 Application topically as needed. 30 g 0   [  Paused] olmesartan (BENICAR) 40 MG tablet Take 40 mg by mouth daily.     polyethylene glycol (MIRALAX ) 17 g packet Take 17 g by mouth daily. 30 each 2   potassium chloride  SA (KLOR-CON  M) 20 MEQ tablet TAKE 1 TABLET(20 MEQ) BY MOUTH DAILY 90 tablet 0   SENNA S 8.6-50 MG tablet Take 2 tablets by mouth at bedtime as needed for mild constipation or moderate constipation.     senna-docusate (STIMULANT LAXATIVE) 8.6-50 MG tablet Take 2 tablets by mouth 2 (two) times daily. May add additional tablet as needed 150 tablet 3   sodium chloride  1 g tablet Take 1 tablet (1 g total) by mouth 3 (three) times daily with meals. 90 tablet 3    thiamine  100 MG tablet Take 1 tablet (100 mg total) by mouth daily.     traMADol  (ULTRAM ) 50 MG tablet Take 1-2 tablets (50-100 mg total) by mouth every 6 (six) hours as needed. 120 tablet 0   No current facility-administered medications for this visit.   Facility-Administered Medications Ordered in Other Visits  Medication Dose Route Frequency Provider Last Rate Last Admin   0.9 %  sodium chloride  infusion   Intravenous Continuous Dorsey, John T IV, MD   Stopped at 05/25/23 1336    VITAL SIGNS: There were no vitals taken for this visit. There were no vitals filed for this visit.   Estimated body mass index is 21.35 kg/m as calculated from the following:   Height as of 05/11/23: 5\' 9"  (1.753 m).   Weight as of an earlier encounter on 05/25/23: 144 lb 9.6 oz (65.6 kg).   PERFORMANCE STATUS (ECOG) : 1 - Symptomatic but completely ambulatory  Physical Exam General: NAD Cardiovascular: Regular rate and rhythm Pulmonary: Normal breathing pattern Extremities: no edema, no joint deformities Skin: no rashes, dry  Neurological: AAO x3  IMPRESSION: Discussed the use of AI scribe software for clinical note transcription with the patient, who gave verbal consent to proceed.  History of Present Illness Terry Pavey. is a 68 year old male  with metastatic lung cancer with bone involvement who presents for follow-up. No acute distress. States he is doing well overall. Denies nausea, vomiting, constipation, or diarrhea. Occasional fatigue however remains as active as possible. Sleeping better at night expressing previous difficulty due to feet pain.    Feels his pain is well-controlled with use of tramadol  50-100 mg as needed in addition to lidocaine  patches.  No adjustments to current regimen.  I discussed the importance of continued conversation with family and their medical providers regarding overall plan of care and treatment options, ensuring decisions are within the context of the  patients values and GOCs. Assessment & Plan Cancer related pain/Back pain Reports pain is well controlled. No adjustments at this time.  - Tramadol  50-100mg  every 6 hours as needed -Lidocaine  patch to back as needed.  Swelling in bilateral feet Swelling in right heel improving. Son is wrapping heel ulceration.  -Maintain close follow-up with medical team as needed.   I will plan to see patient back in 3-4 weeks. Sooner if needed.   Patient expressed understanding and was in agreement with this plan. He also understands that He can call the clinic at any time with any questions, concerns, or complaints.   Any controlled substances utilized were prescribed in the context of palliative care. PDMP has been reviewed.   Visit consisted of counseling and education dealing with the complex and emotionally intense issues of symptom  management and palliative care in the setting of serious and potentially life-threatening illness.  Dellia Ferguson, AGPCNP-BC  Palliative Medicine Team/[facility]

## 2023-05-25 NOTE — Patient Instructions (Signed)
 [facility] - A DEPT OF [facility]  Discharge Instructions: Thank you for choosing [facility] to provide your oncology and hematology care.   If you have a lab appointment with the Cancer Center, please go directly to the Cancer Center and check in at the registration area.   Wear comfortable clothing and clothing appropriate for easy access to any Portacath or PICC line.   We strive to give you quality time with your provider. You may need to reschedule your appointment if you arrive late (15 or more minutes).  Arriving late affects you and other patients whose appointments are after yours.  Also, if you miss three or more appointments without notifying the office, you may be dismissed from the clinic at the provider's discretion.      For prescription refill requests, have your pharmacy contact our office and allow 72 hours for refills to be completed.    Today you received the following chemotherapy and/or immunotherapy agents: Gemcitabine.       To help prevent nausea and vomiting after your treatment, we encourage you to take your nausea medication as directed.  BELOW ARE SYMPTOMS THAT SHOULD BE REPORTED IMMEDIATELY: *FEVER GREATER THAN 100.4 F (38 C) OR HIGHER *CHILLS OR SWEATING *NAUSEA AND VOMITING THAT IS NOT CONTROLLED WITH YOUR NAUSEA MEDICATION *UNUSUAL SHORTNESS OF BREATH *UNUSUAL BRUISING OR BLEEDING *URINARY PROBLEMS (pain or burning when urinating, or frequent urination) *BOWEL PROBLEMS (unusual diarrhea, constipation, pain near the anus) TENDERNESS IN MOUTH AND THROAT WITH OR WITHOUT PRESENCE OF ULCERS (sore throat, sores in mouth, or a toothache) UNUSUAL RASH, SWELLING OR PAIN  UNUSUAL VAGINAL DISCHARGE OR ITCHING   Items with * indicate a potential emergency and should be followed up as soon as possible or go to the Emergency Department if any problems should occur.  Please show the CHEMOTHERAPY ALERT CARD or  IMMUNOTHERAPY ALERT CARD at check-in to the Emergency Department and triage nurse.  Should you have questions after your visit or need to cancel or reschedule your appointment, please contact [facility] - A DEPT [facility]  Dept: [phone]  and follow the prompts.  Office hours are 8:00 a.m. to 4:30 p.m. Monday - Friday. Please note that voicemails left after 4:00 p.m. may not be returned until the following business day.  We are closed weekends and major holidays. You have access to a nurse at all times for urgent questions. Please call the main number to the clinic Dept: [phone] and follow the prompts.   For any non-urgent questions, you may also contact your provider using MyChart. We now offer e-Visits for anyone 19 and older to request care online for non-urgent symptoms. For details visit [email or web address].   Also download the MyChart app! Go to the app store, search "MyChart", open the app, select [facility], and log in with your MyChart username and password.

## 2023-05-26 ENCOUNTER — Encounter: Payer: Self-pay | Admitting: Hematology and Oncology

## 2023-05-30 ENCOUNTER — Ambulatory Visit (HOSPITAL_COMMUNITY)
Admission: RE | Admit: 2023-05-30 | Discharge: 2023-05-30 | Disposition: A | Discharge status: Home / Self Care | Source: Ambulatory Visit | Attending: Podiatry | Admitting: Podiatry

## 2023-05-30 DIAGNOSIS — L97421 Non-pressure chronic ulcer of left heel and midfoot limited to breakdown of skin: Secondary | ICD-10-CM

## 2023-05-30 DIAGNOSIS — I739 Peripheral vascular disease, unspecified: Secondary | ICD-10-CM | POA: Diagnosis not present

## 2023-05-30 DIAGNOSIS — L97411 Non-pressure chronic ulcer of right heel and midfoot limited to breakdown of skin: Secondary | ICD-10-CM

## 2023-05-30 LAB — VAS US ABI WITH/WO TBI
Left ABI: 0.85
Right ABI: 0.76

## 2023-05-31 ENCOUNTER — Ambulatory Visit: Admitting: Podiatry

## 2023-05-31 DIAGNOSIS — L97411 Non-pressure chronic ulcer of right heel and midfoot limited to breakdown of skin: Secondary | ICD-10-CM

## 2023-05-31 DIAGNOSIS — L97421 Non-pressure chronic ulcer of left heel and midfoot limited to breakdown of skin: Secondary | ICD-10-CM

## 2023-05-31 DIAGNOSIS — I739 Peripheral vascular disease, unspecified: Secondary | ICD-10-CM | POA: Diagnosis not present

## 2023-05-31 NOTE — Progress Notes (Signed)
 Subjective:  Patient ID: Terry Drier., male    DOB: 10-21-1955,  MRN: [id number]  Chief Complaint  Patient presents with   Foot Ulcer    Left foot ulcer follow up     68 y.o. male presents with the above complaint. History confirmed with patient.  Notes that is doing much better   Objective:  Physical Exam: warm, good capillary refill, DP reduced bilateral, nail exam onychomycosis of the toenails, PT reduced bilateral, and ulceration on the right heel is completely epithelialized small partial-thickness skin breakdown on the left heel no signs of infection or active drainage        ABI Findings:  +---------+------------------+-----+----------+--------+  Right   Rt Pressure (mmHg)IndexWaveform  Comment   +---------+------------------+-----+----------+--------+  Brachial 124                                        +---------+------------------+-----+----------+--------+  PTA     98                0.75 monophasic          +---------+------------------+-----+----------+--------+  DP      100               0.76 monophasic          +---------+------------------+-----+----------+--------+  Great Toe50                0.38 Abnormal            +---------+------------------+-----+----------+--------+   +---------+------------------+-----+----------+-------+  Left    Lt Pressure (mmHg)IndexWaveform  Comment  +---------+------------------+-----+----------+-------+  Brachial 131                                       +---------+------------------+-----+----------+-------+  PTA     64                0.49 monophasic         +---------+------------------+-----+----------+-------+  DP      111               0.85 monophasic         +---------+------------------+-----+----------+-------+  Great Toe55                0.42 Abnormal           +---------+------------------+-----+----------+-------+    +-------+-----------+-----------+------------+------------+  ABI/TBIToday's ABIToday's TBIPrevious ABIPrevious TBI  +-------+-----------+-----------+------------+------------+  Right 0.76       0.38       0.78        0.59          +-------+-----------+-----------+------------+------------+  Left  0.85       0.42       0.84        0.75          +-------+-----------+-----------+------------+------------+         Bilateral ABIs appear essentially unchanged. Bilateral TBIs appear  decreased.    Summary:  Right: Resting right ankle-brachial index indicates moderate right lower  extremity arterial disease. The right toe-brachial index is abnormal.   Left: Resting left ankle-brachial index indicates mild left lower  extremity arterial disease. The left toe-brachial index is abnormal.    Assessment:   1. Ulcer of left heel and midfoot, limited to breakdown of skin ([name])   2. Ulcer of heel, right, limited to breakdown  of skin ([name])   3. PAD (peripheral artery disease) ([name])       Plan:  Patient was evaluated and treated and all questions answered.  Doing much better and he has been active with offloading.  Continue this at home may leave open to air and utilize Neosporin or Vaseline on the area to keep the skin soft and moisturized.  Follow-up in 1 month for final checkup.  ABI results were reviewed he does have mild to moderate PAD but is unchanged since his previous study in 2024 and would not recommend further intervention at this time  No follow-ups on file.

## 2023-06-01 ENCOUNTER — Inpatient Hospital Stay

## 2023-06-01 ENCOUNTER — Ambulatory Visit: Admitting: Hematology and Oncology

## 2023-06-01 ENCOUNTER — Ambulatory Visit

## 2023-06-01 ENCOUNTER — Other Ambulatory Visit

## 2023-06-01 ENCOUNTER — Other Ambulatory Visit: Payer: Self-pay

## 2023-06-01 VITALS — BP 137/84 | HR 100 | Temp 98.0°F | Resp 18 | Wt 144.8 lb

## 2023-06-01 DIAGNOSIS — Z5111 Encounter for antineoplastic chemotherapy: Secondary | ICD-10-CM | POA: Diagnosis not present

## 2023-06-01 DIAGNOSIS — G893 Neoplasm related pain (acute) (chronic): Secondary | ICD-10-CM

## 2023-06-01 DIAGNOSIS — Z515 Encounter for palliative care: Secondary | ICD-10-CM

## 2023-06-01 DIAGNOSIS — C3412 Malignant neoplasm of upper lobe, left bronchus or lung: Secondary | ICD-10-CM

## 2023-06-01 DIAGNOSIS — C7951 Secondary malignant neoplasm of bone: Secondary | ICD-10-CM

## 2023-06-01 LAB — CMP (CANCER CENTER ONLY)
ALT: 23 U/L (ref 0–44)
AST: 29 U/L (ref 15–41)
Albumin: 3.1 g/dL — ABNORMAL LOW (ref 3.5–5.0)
Alkaline Phosphatase: 85 U/L (ref 38–126)
Anion gap: 6 (ref 5–15)
BUN: 6 mg/dL — ABNORMAL LOW (ref 8–23)
CO2: 27 mmol/L (ref 22–32)
Calcium: 8.9 mg/dL (ref 8.9–10.3)
Chloride: 100 mmol/L (ref 98–111)
Creatinine: 0.69 mg/dL (ref 0.61–1.24)
GFR, Estimated: >60 mL/min (ref >60)
Glucose, Bld: 93 mg/dL (ref 70–99)
Potassium: 3.9 mmol/L (ref 3.5–5.1)
Sodium: 133 mmol/L — ABNORMAL LOW (ref 135–145)
Total Bilirubin: 0.2 mg/dL (ref 0.0–1.2)
Total Protein: 6.6 g/dL (ref 6.5–8.1)

## 2023-06-01 LAB — CBC WITH DIFFERENTIAL (CANCER CENTER ONLY)
Abs Immature Granulocytes: 0.51 10*3/uL — ABNORMAL HIGH (ref 0.00–0.07)
Basophils Absolute: 0 10*3/uL (ref 0.0–0.1)
Basophils Relative: 0 %
Eosinophils Absolute: 0.2 10*3/uL (ref 0.0–0.5)
Eosinophils Relative: 2 %
HCT: 25.1 % — ABNORMAL LOW (ref 39.0–52.0)
Hemoglobin: 8.2 g/dL — ABNORMAL LOW (ref 13.0–17.0)
Immature Granulocytes: 5 %
Lymphocytes Relative: 13 %
Lymphs Abs: 1.3 10*3/uL (ref 0.7–4.0)
MCH: 29.5 pg (ref 26.0–34.0)
MCHC: 32.7 g/dL (ref 30.0–36.0)
MCV: 90.3 fL (ref 80.0–100.0)
Monocytes Absolute: 1.9 10*3/uL — ABNORMAL HIGH (ref 0.1–1.0)
Monocytes Relative: 18 %
Neutro Abs: 6.4 10*3/uL (ref 1.7–7.7)
Neutrophils Relative %: 62 %
Platelet Count: 566 10*3/uL — ABNORMAL HIGH (ref 150–400)
RBC: 2.78 MIL/uL — ABNORMAL LOW (ref 4.22–5.81)
RDW: 24 % — ABNORMAL HIGH (ref 11.5–15.5)
WBC Count: 10.4 10*3/uL (ref 4.0–10.5)
nRBC: 0.4 % — ABNORMAL HIGH (ref 0.0–0.2)

## 2023-06-01 MED ORDER — SODIUM CHLORIDE 0.9 % IV SOLN: INTRAVENOUS | Status: DC

## 2023-06-01 MED ORDER — SODIUM CHLORIDE 0.9% FLUSH
10.0000 mL | INTRAVENOUS | Status: DC | PRN
  Administered 2023-06-01: 10 mL

## 2023-06-01 MED ORDER — ASPIRIN 325 MG PO TBEC: 325.0000 mg | DELAYED_RELEASE_TABLET | Freq: Every day | ORAL | 1 refills | Status: AC

## 2023-06-01 MED ORDER — TRAMADOL HCL 50 MG PO TABS: 50.0000 mg | ORAL_TABLET | Freq: Four times a day (QID) | ORAL | 0 refills | Status: DC | PRN

## 2023-06-01 MED ORDER — HEPARIN SOD (PORK) LOCK FLUSH 100 UNIT/ML IV SOLN
500.0000 [IU] | Freq: Once | INTRAVENOUS | Status: AC | PRN
  Administered 2023-06-01: 500 [IU]

## 2023-06-01 MED ORDER — SODIUM CHLORIDE 0.9 % IV SOLN
1000.0000 mg/m2 | Freq: Once | INTRAVENOUS | Status: AC
  Administered 2023-06-01: 1824 mg via INTRAVENOUS
  Filled 2023-06-01: qty 47.97

## 2023-06-01 MED ORDER — PROCHLORPERAZINE MALEATE 10 MG PO TABS
10.0000 mg | ORAL_TABLET | Freq: Once | ORAL | Status: AC
  Administered 2023-06-01: 10 mg via ORAL
  Filled 2023-06-01: qty 1

## 2023-06-01 NOTE — Telephone Encounter (Signed)
 Pt called for medication refill, see associated orders

## 2023-06-01 NOTE — Patient Instructions (Signed)
 [facility] - A DEPT OF [facility]  Discharge Instructions: Thank you for choosing [facility] to provide your oncology and hematology care.   If you have a lab appointment with the Cancer Center, please go directly to the Cancer Center and check in at the registration area.   Wear comfortable clothing and clothing appropriate for easy access to any Portacath or PICC line.   We strive to give you quality time with your provider. You may need to reschedule your appointment if you arrive late (15 or more minutes).  Arriving late affects you and other patients whose appointments are after yours.  Also, if you miss three or more appointments without notifying the office, you may be dismissed from the clinic at the provider's discretion.      For prescription refill requests, have your pharmacy contact our office and allow 72 hours for refills to be completed.    Today you received the following chemotherapy and/or immunotherapy agents: Gemcitabine.       To help prevent nausea and vomiting after your treatment, we encourage you to take your nausea medication as directed.  BELOW ARE SYMPTOMS THAT SHOULD BE REPORTED IMMEDIATELY: *FEVER GREATER THAN 100.4 F (38 C) OR HIGHER *CHILLS OR SWEATING *NAUSEA AND VOMITING THAT IS NOT CONTROLLED WITH YOUR NAUSEA MEDICATION *UNUSUAL SHORTNESS OF BREATH *UNUSUAL BRUISING OR BLEEDING *URINARY PROBLEMS (pain or burning when urinating, or frequent urination) *BOWEL PROBLEMS (unusual diarrhea, constipation, pain near the anus) TENDERNESS IN MOUTH AND THROAT WITH OR WITHOUT PRESENCE OF ULCERS (sore throat, sores in mouth, or a toothache) UNUSUAL RASH, SWELLING OR PAIN  UNUSUAL VAGINAL DISCHARGE OR ITCHING   Items with * indicate a potential emergency and should be followed up as soon as possible or go to the Emergency Department if any problems should occur.  Please show the CHEMOTHERAPY ALERT CARD or  IMMUNOTHERAPY ALERT CARD at check-in to the Emergency Department and triage nurse.  Should you have questions after your visit or need to cancel or reschedule your appointment, please contact [facility] - A DEPT [facility]  Dept: [phone]  and follow the prompts.  Office hours are 8:00 a.m. to 4:30 p.m. Monday - Friday. Please note that voicemails left after 4:00 p.m. may not be returned until the following business day.  We are closed weekends and major holidays. You have access to a nurse at all times for urgent questions. Please call the main number to the clinic Dept: [phone] and follow the prompts.   For any non-urgent questions, you may also contact your provider using MyChart. We now offer e-Visits for anyone 19 and older to request care online for non-urgent symptoms. For details visit [email or web address].   Also download the MyChart app! Go to the app store, search "MyChart", open the app, select [facility], and log in with your MyChart username and password.

## 2023-06-08 ENCOUNTER — Inpatient Hospital Stay: Attending: Hematology and Oncology

## 2023-06-08 ENCOUNTER — Inpatient Hospital Stay: Attending: Hematology and Oncology | Admitting: Hematology and Oncology

## 2023-06-08 ENCOUNTER — Other Ambulatory Visit: Payer: Self-pay | Admitting: *Deleted

## 2023-06-08 ENCOUNTER — Inpatient Hospital Stay

## 2023-06-08 VITALS — HR 89

## 2023-06-08 VITALS — BP 112/73 | HR 117 | Temp 98.6°F | Resp 18 | Wt 143.9 lb

## 2023-06-08 DIAGNOSIS — Z79891 Long term (current) use of opiate analgesic: Secondary | ICD-10-CM | POA: Insufficient documentation

## 2023-06-08 DIAGNOSIS — Z87891 Personal history of nicotine dependence: Secondary | ICD-10-CM | POA: Diagnosis not present

## 2023-06-08 DIAGNOSIS — Z5111 Encounter for antineoplastic chemotherapy: Secondary | ICD-10-CM | POA: Insufficient documentation

## 2023-06-08 DIAGNOSIS — G893 Neoplasm related pain (acute) (chronic): Secondary | ICD-10-CM

## 2023-06-08 DIAGNOSIS — C3412 Malignant neoplasm of upper lobe, left bronchus or lung: Secondary | ICD-10-CM | POA: Insufficient documentation

## 2023-06-08 DIAGNOSIS — I10 Essential (primary) hypertension: Secondary | ICD-10-CM | POA: Diagnosis not present

## 2023-06-08 DIAGNOSIS — C7951 Secondary malignant neoplasm of bone: Secondary | ICD-10-CM | POA: Insufficient documentation

## 2023-06-08 DIAGNOSIS — T451X5D Adverse effect of antineoplastic and immunosuppressive drugs, subsequent encounter: Secondary | ICD-10-CM | POA: Insufficient documentation

## 2023-06-08 DIAGNOSIS — D509 Iron deficiency anemia, unspecified: Secondary | ICD-10-CM | POA: Diagnosis not present

## 2023-06-08 DIAGNOSIS — D6481 Anemia due to antineoplastic chemotherapy: Secondary | ICD-10-CM | POA: Diagnosis not present

## 2023-06-08 DIAGNOSIS — D649 Anemia, unspecified: Secondary | ICD-10-CM

## 2023-06-08 DIAGNOSIS — Z79899 Other long term (current) drug therapy: Secondary | ICD-10-CM | POA: Insufficient documentation

## 2023-06-08 DIAGNOSIS — C61 Malignant neoplasm of prostate: Secondary | ICD-10-CM | POA: Diagnosis not present

## 2023-06-08 DIAGNOSIS — M25551 Pain in right hip: Secondary | ICD-10-CM | POA: Diagnosis not present

## 2023-06-08 DIAGNOSIS — E871 Hypo-osmolality and hyponatremia: Secondary | ICD-10-CM | POA: Diagnosis not present

## 2023-06-08 DIAGNOSIS — Z95828 Presence of other vascular implants and grafts: Secondary | ICD-10-CM

## 2023-06-08 DIAGNOSIS — D72829 Elevated white blood cell count, unspecified: Secondary | ICD-10-CM | POA: Insufficient documentation

## 2023-06-08 LAB — CBC WITH DIFFERENTIAL (CANCER CENTER ONLY)
Abs Immature Granulocytes: 0.56 10*3/uL — ABNORMAL HIGH (ref 0.00–0.07)
Basophils Absolute: 0.1 10*3/uL (ref 0.0–0.1)
Basophils Relative: 1 %
Eosinophils Absolute: 0.2 10*3/uL (ref 0.0–0.5)
Eosinophils Relative: 2 %
HCT: 26.6 % — ABNORMAL LOW (ref 39.0–52.0)
Hemoglobin: 8.7 g/dL — ABNORMAL LOW (ref 13.0–17.0)
Immature Granulocytes: 6 %
Lymphocytes Relative: 14 %
Lymphs Abs: 1.2 10*3/uL (ref 0.7–4.0)
MCH: 29.7 pg (ref 26.0–34.0)
MCHC: 32.7 g/dL (ref 30.0–36.0)
MCV: 90.8 fL (ref 80.0–100.0)
Monocytes Absolute: 1.3 10*3/uL — ABNORMAL HIGH (ref 0.1–1.0)
Monocytes Relative: 15 %
Neutro Abs: 5.4 10*3/uL (ref 1.7–7.7)
Neutrophils Relative %: 62 %
Platelet Count: 275 10*3/uL (ref 150–400)
RBC: 2.93 MIL/uL — ABNORMAL LOW (ref 4.22–5.81)
RDW: 23.7 % — ABNORMAL HIGH (ref 11.5–15.5)
Smear Review: NORMAL
WBC Count: 8.8 10*3/uL (ref 4.0–10.5)
nRBC: 0.8 % — ABNORMAL HIGH (ref 0.0–0.2)

## 2023-06-08 LAB — CMP (CANCER CENTER ONLY)
ALT: 12 U/L (ref 0–44)
AST: 16 U/L (ref 15–41)
Albumin: 3.1 g/dL — ABNORMAL LOW (ref 3.5–5.0)
Alkaline Phosphatase: 72 U/L (ref 38–126)
Anion gap: 8 (ref 5–15)
BUN: 9 mg/dL (ref 8–23)
CO2: 28 mmol/L (ref 22–32)
Calcium: 9.2 mg/dL (ref 8.9–10.3)
Chloride: 100 mmol/L (ref 98–111)
Creatinine: 0.84 mg/dL (ref 0.61–1.24)
GFR, Estimated: >60 mL/min (ref >60)
Glucose, Bld: 95 mg/dL (ref 70–99)
Potassium: 3.7 mmol/L (ref 3.5–5.1)
Sodium: 136 mmol/L (ref 135–145)
Total Bilirubin: 0.2 mg/dL (ref 0.0–1.2)
Total Protein: 6.6 g/dL (ref 6.5–8.1)

## 2023-06-08 LAB — SAMPLE TO BLOOD BANK

## 2023-06-08 MED ORDER — SODIUM CHLORIDE 0.9 % IV SOLN
1000.0000 mg/m2 | Freq: Once | INTRAVENOUS | Status: AC
  Administered 2023-06-08: 1824 mg via INTRAVENOUS
  Filled 2023-06-08: qty 47.97

## 2023-06-08 MED ORDER — PROCHLORPERAZINE MALEATE 10 MG PO TABS
10.0000 mg | ORAL_TABLET | Freq: Once | ORAL | Status: AC
  Administered 2023-06-08: 10 mg via ORAL
  Filled 2023-06-08: qty 1

## 2023-06-08 MED ORDER — SODIUM CHLORIDE 1 G PO TABS: 1.0000 g | ORAL_TABLET | Freq: Three times a day (TID) | ORAL | 3 refills | Status: DC

## 2023-06-08 MED ORDER — SODIUM CHLORIDE 0.9 % IV SOLN: INTRAVENOUS | Status: DC

## 2023-06-08 MED ORDER — SODIUM CHLORIDE 0.9% FLUSH
10.0000 mL | Freq: Once | INTRAVENOUS | Status: AC
  Administered 2023-06-08: 10 mL

## 2023-06-08 MED ORDER — HYDROXYZINE HCL 25 MG PO TABS: 25.0000 mg | ORAL_TABLET | Freq: Every evening | ORAL | 11 refills | Status: DC | PRN

## 2023-06-08 NOTE — Patient Instructions (Signed)
 [facility] - A DEPT OF [facility]  Discharge Instructions: Thank you for choosing [facility] to provide your oncology and hematology care.   If you have a lab appointment with the Cancer Center, please go directly to the Cancer Center and check in at the registration area.   Wear comfortable clothing and clothing appropriate for easy access to any Portacath or PICC line.   We strive to give you quality time with your provider. You may need to reschedule your appointment if you arrive late (15 or more minutes).  Arriving late affects you and other patients whose appointments are after yours.  Also, if you miss three or more appointments without notifying the office, you may be dismissed from the clinic at the provider's discretion.      For prescription refill requests, have your pharmacy contact our office and allow 72 hours for refills to be completed.    Today you received the following chemotherapy and/or immunotherapy agents: Gemcitabine.       To help prevent nausea and vomiting after your treatment, we encourage you to take your nausea medication as directed.  BELOW ARE SYMPTOMS THAT SHOULD BE REPORTED IMMEDIATELY: *FEVER GREATER THAN 100.4 F (38 C) OR HIGHER *CHILLS OR SWEATING *NAUSEA AND VOMITING THAT IS NOT CONTROLLED WITH YOUR NAUSEA MEDICATION *UNUSUAL SHORTNESS OF BREATH *UNUSUAL BRUISING OR BLEEDING *URINARY PROBLEMS (pain or burning when urinating, or frequent urination) *BOWEL PROBLEMS (unusual diarrhea, constipation, pain near the anus) TENDERNESS IN MOUTH AND THROAT WITH OR WITHOUT PRESENCE OF ULCERS (sore throat, sores in mouth, or a toothache) UNUSUAL RASH, SWELLING OR PAIN  UNUSUAL VAGINAL DISCHARGE OR ITCHING   Items with * indicate a potential emergency and should be followed up as soon as possible or go to the Emergency Department if any problems should occur.  Please show the CHEMOTHERAPY ALERT CARD or  IMMUNOTHERAPY ALERT CARD at check-in to the Emergency Department and triage nurse.  Should you have questions after your visit or need to cancel or reschedule your appointment, please contact [facility] - A DEPT [facility]  Dept: [phone]  and follow the prompts.  Office hours are 8:00 a.m. to 4:30 p.m. Monday - Friday. Please note that voicemails left after 4:00 p.m. may not be returned until the following business day.  We are closed weekends and major holidays. You have access to a nurse at all times for urgent questions. Please call the main number to the clinic Dept: [phone] and follow the prompts.   For any non-urgent questions, you may also contact your provider using MyChart. We now offer e-Visits for anyone 19 and older to request care online for non-urgent symptoms. For details visit [email or web address].   Also download the MyChart app! Go to the app store, search "MyChart", open the app, select [facility], and log in with your MyChart username and password.

## 2023-06-08 NOTE — Progress Notes (Signed)
 [facility] [phone]   [phone]  PROGRESS NOTE  Patient Care Team: Dorena Gander, MD as PCP - General (Family Medicine)  Hematological/Oncological History # Metastatic Adenocarcinoma of the Lung  01/07/2020 : CT of the chest with contrast performed which showed interval enlargement of a spiculated nodule in the central left upper lobe measuring 1.2 x 1.0 cm and highly suspicious for primary lung malignancy 02/06/2020 :PET scan performed which showed a hypermetabolic irregular solid 1.3 cm left upper lobe pulmonary nodule compatible with malignancy without any other hypermetabolic lesions 04/06/4096- 04/19/2020:  SBRT to the lung lesion 04/28/2021: CT C/A/P showed multifocal lytic bone metastases are identified. Lesion within the spine of the left scapula and lesions involving bilateral iliac bones and left sacral wing. Establish care with Dr. Rosaline Coma  05/03/2021: biopsy of right iliac lytic lesion showed metastatic carcinoma, consistent with lung primary.  07/08/2021: Cycle 1 Day 1 of Carbo/Pem/Pem 07/29/2021: Cycle 2 Day 1 of Carbo/Pem/Pem 08/11/2021-08/24/2021: Received palliative radiation to osseous metastasis in the left shoulder. 30 Gy in 10 fractions.  08/19/2021: Cycle 3 Day 1 of Carbo/Pem/Pem 09/09/2021: Cycle 4 Day 1 of Carbo/Pem/Pem 09/23/2021: CT CAP: stable disease 09/30/2021: Cycle 5 Day 1 of Pem/Pem 10/21/2021: Cycle 6 Day 1 of Pembrolizumab . Held pemetrexed  due to anemia. 11/16/2021: Cycle 7 Day 1 of Pem/Pem.   12/16/2021: Cycle 8 Day 1 of Pem/Pem.   01/06/2022: Cycle 9 Day 1 of Pem/Pem.  01/27/2022: Cycle 10 Day 1 of Pem/Pem 02/17/2022: Cycle 11 Day 1 of Pem/Pem 03/10/2022: Cycle 12 Day 1 of Pem/Pem (HELD due to anemia with Hgb of 6.4) 03/17/2022: Cycle 12 Day 1 of Pem/Pem (HELD pemextrexed due to cytopenias/kidney function) 04/10/2022: Cycle 13 Day 1 of Pem/Pem (HELD pemextrexed due to cytopenias/kidney function) 04/28/2022: Cycle 14 Day 1 of Pem/Pem  (HELD pemextrexed due to cytopenias/kidney function) 05/23/2022:  Cycle 15 Day 1 of Pem/Pem (HELD pemextrexed due to cytopenias/kidney function) 06/15/2022: Cycle 16 Day 1 of Pembrolizumab .  07/07/2022: Cycle 17 Day 1 of Pembrolizumab .  07/28/2022: Cycle 18 Day 1 of Pembrolizumab  08/18/2022: Cycle 19 Day 1 of Pembrolizumab  HELD due to hyponatremia of 118, new bone metastases. 08/25/2022: Cycle 19 Day 1 of Pembrolizumab  08/28/2022-09/13/2022: Received palliative radiation to right pelvis with total dose of 30 Gy in 10 Fx.  09/22/2022:  Cycle 20 Day 1 of Pembrolizumab  10/13/2022:  Cycle 21 Day 1 of Pembrolizumab  12/11/2022: Cycle 1 Day 1 of Docetaxel /Ramucircumab.  01/05/2023: Cycle 2 Day 1 of Docetaxel /Ramucircumab.  01/26/2023: Cycle 3 Day 1 of Docetaxel /Ramucircumab 02/16/2023:  Cycle 4 Day 1 of Docetaxel /Ramucircumab 03/09/2023: Cycle 5 Day 1 of Docetaxel /Ramucircumab 03/30/2023:  Cycle 6 Day 1 of Docetaxel /Ramucircumab 04/20/2023-04/23/2023: Admitted for slurred speech and weakness. MRI brain concerning for stroke. CTA chest found progression of irregular consolidation in the left apex.  04/27/2023: Cycle 1, Day 1 of Gemcitabine  05/25/2023: Cycle 2, Day 1 of Gemcitabine    #Adenocarcinoma of the Prostate, T1cN0M0 08/19/2019-10/07/2019: 70 Gy in 28 fractions of 2.5 Gy.  Radiation to the prostate was under the care of Dr. Kenith Payer  Interval History:  Terry Page. 68 y.o. male with medical history significant for metastatic adenocarcinoma of the lung who presents for a follow up visit. The patient's last visit was on 05/25/2023. In the interim since the last visit, he has continued on single agent gemcitabine .  On exam today, Terry Page reports he has no major plans for the summer and plans to "take it easy".  He reports his granddaughter is graduating  from the sixth grade soon.  He reports overall he feels well.  He reports his energy levels are good.  He does occasionally feel tired for several  hours after his treatment but generally recovers quickly.  He notes that he became little short of breath on exertion but no shortness of breath at baseline.  He reports his appetite has been good.  He denies any nausea, vomiting, or diarrhea.  He is not having any other major side effects as a result of his chemotherapy treatment a full 10 point ROS is otherwise negative.  He is willing and able to proceed with treatment today.  MEDICAL HISTORY:  Past Medical History:  Diagnosis Date   Alcohol abuse    Asthma    Bullous emphysema ([name])    Essential hypertension 04/10/2020   Hemorrhoids    History of radiation therapy 04/08/20-04/19/20   IMRT- Left lung- Dr. Retta Caster   Incidental lung nodule, greater than or equal to 8mm 10/22/2017   Left upper lobe - discovered on CTA   Prostate cancer ([name])    Spontaneous pneumothorax 10/20/2017   right   Tobacco abuse     SURGICAL HISTORY: Past Surgical History:  Procedure Laterality Date   BACK SURGERY     IR IMAGING GUIDED PORT INSERTION  06/29/2021   PROSTATE BIOPSY      SOCIAL HISTORY: Social History   Socioeconomic History   Marital status: Single    Spouse name: Not on file   Number of children: Not on file   Years of education: Not on file   Highest education level: Not on file  Occupational History   Occupation: retired  Tobacco Use   Smoking status: Former    Current packs/day: 0.00    Types: Cigarettes    Quit date: 11/22/2019    Years since quitting: 3.5   Smokeless tobacco: Never   Tobacco comments:    Patient reports quit 3 years ago. 06/25/20. [facility]  Vaping Use   Vaping status: Never Used  Substance and Sexual Activity   Alcohol use: Yes    Alcohol/week: 12.0 standard drinks of alcohol    Types: 12 Cans of beer per week    Comment: daily sometimes   Drug use: No   Sexual activity: Not Currently  Other Topics Concern   Not on file  Social History Narrative   Not on file   Social Drivers of Health    Financial Resource Strain: Not on file  Food Insecurity: No Food Insecurity (04/22/2023)   Hunger Vital Sign    Worried About Running Out of Food in the Last Year: Never true    Ran Out of Food in the Last Year: Never true  Transportation Needs: No Transportation Needs (04/22/2023)   PRAPARE - Administrator, Civil Service (Medical): No    Lack of Transportation (Non-Medical): No  Recent Concern: Transportation Needs - Unmet Transportation Needs (04/21/2023)   PRAPARE - Administrator, Civil Service (Medical): Yes    Lack of Transportation (Non-Medical): No  Physical Activity: Not on file  Stress: Not on file  Social Connections: Moderately Integrated (04/21/2023)   Social Connection and Isolation Panel [NHANES]    Frequency of Communication with Friends and Family: More than three times a week    Frequency of Social Gatherings with Friends and Family: More than three times a week    Attends Religious Services: 1 to 4 times per year    Active Member of  Clubs or Organizations: No    Attends Banker Meetings: Never    Marital Status: Married  Catering manager Violence: Not At Risk (04/22/2023)   Humiliation, Afraid, Rape, and Kick questionnaire    Fear of Current or Ex-Partner: No    Emotionally Abused: No    Physically Abused: No    Sexually Abused: No    FAMILY HISTORY: Family History  Problem Relation Age of Onset   Cancer Cousin        maternal cousin   Cancer Cousin        paternal cousin   Cancer Cousin    Colon polyps Neg Hx    Pancreatic disease Neg Hx    Pancreatic cancer Neg Hx    Breast cancer Neg Hx    Colon cancer Neg Hx     ALLERGIES:  is allergic to morphine  and oxycodone .  MEDICATIONS:  Current Outpatient Medications  Medication Sig Dispense Refill   acetaminophen  (TYLENOL ) 325 MG tablet Take 2 tablets (650 mg total) by mouth every 6 (six) hours as needed for mild pain or headache (fever >/= 101).     albuterol   (VENTOLIN  HFA) 108 (90 Base) MCG/ACT inhaler Inhale 2 puffs into the lungs every 6 (six) hours as needed for wheezing or shortness of breath.     [Paused] amLODipine  (NORVASC ) 10 MG tablet Take 1 tablet by mouth daily.     aspirin  EC 325 MG tablet Take 1 tablet (325 mg total) by mouth daily. 30 tablet 1   atorvastatin  (LIPITOR) 40 MG tablet Take 1 tablet (40 mg total) by mouth daily. 30 tablet 1   cyanocobalamin  (VITAMIN B12) 1000 MCG tablet Take 1 tablet (1,000 mcg total) by mouth daily. (Patient taking differently: Take 1,000 mcg by mouth as needed (When directed by physican).) 90 tablet 1   dronabinol  (MARINOL ) 5 MG capsule Take 1 capsule (5 mg total) by mouth 2 (two) times daily before lunch and supper. 60 capsule 0   ferrous sulfate  325 (65 FE) MG EC tablet Take 1 tablet (325 mg total) by mouth daily with breakfast. 30 tablet 3   folic acid  (FOLVITE ) 1 MG tablet Take 1 tablet (1 mg total) by mouth daily. 90 tablet 3   hydrOXYzine  (ATARAX ) 25 MG tablet Take 1 tablet (25 mg total) by mouth at bedtime as needed for itching. 30 tablet 11   ipratropium-albuterol  (DUONEB) 0.5-2.5 (3) MG/3ML SOLN Take 3 mLs by nebulization every 6 (six) hours as needed. (Patient taking differently: Take 3 mLs by nebulization every 6 (six) hours as needed (shortness of breathing or wheezing).) 360 mL 0   lidocaine  (LIDODERM ) 5 % Place 1 patch onto the skin daily. Remove & Discard patch within 12 hours or as directed by MD. Apply to low back 30 patch 0   lidocaine -prilocaine  (EMLA ) cream Apply 1 Application topically as needed. 30 g 0   [Paused] olmesartan (BENICAR) 40 MG tablet Take 40 mg by mouth daily.     polyethylene glycol (MIRALAX ) 17 g packet Take 17 g by mouth daily. 30 each 2   potassium chloride  SA (KLOR-CON  M) 20 MEQ tablet TAKE 1 TABLET(20 MEQ) BY MOUTH DAILY 90 tablet 0   SENNA S 8.6-50 MG tablet Take 2 tablets by mouth at bedtime as needed for mild constipation or moderate constipation.     senna-docusate  (STIMULANT LAXATIVE) 8.6-50 MG tablet Take 2 tablets by mouth 2 (two) times daily. May add additional tablet as needed 150 tablet 3  sodium chloride  1 g tablet Take 1 tablet (1 g total) by mouth 3 (three) times daily with meals. 90 tablet 3   thiamine  100 MG tablet Take 1 tablet (100 mg total) by mouth daily.     traMADol  (ULTRAM ) 50 MG tablet Take 1-2 tablets (50-100 mg total) by mouth every 6 (six) hours as needed. 120 tablet 0   No current facility-administered medications for this visit.    REVIEW OF SYSTEMS:   Constitutional: ( - ) fevers, ( - )  chills , ( - ) night sweats Eyes: ( - ) blurriness of vision, ( - ) double vision, ( - ) watery eyes Ears, nose, mouth, throat, and face: ( - ) mucositis, ( - ) sore throat Respiratory: ( - ) cough, ( - ) dyspnea, ( - ) wheezes Cardiovascular: ( - ) palpitation, ( - ) chest discomfort, ( - ) lower extremity swelling Gastrointestinal:  ( - ) nausea, ( - ) heartburn, ( - ) change in bowel habits Skin: ( - ) abnormal skin rashes Lymphatics: ( - ) new lymphadenopathy, ( - ) easy bruising Neurological: ( - ) numbness, ( - ) tingling, ( - ) new weaknesses Behavioral/Psych: ( - ) mood change, ( - ) new changes  All other systems were reviewed with the patient and are negative.  PHYSICAL EXAMINATION: ECOG PERFORMANCE STATUS: 1 - Symptomatic but completely ambulatory  Vitals:   06/08/23 0903  BP: 112/73  Pulse: (!) 117  Resp: 18  Temp: 98.6 F (37 C)  SpO2: 100%        Filed Weights   06/08/23 0903  Weight: 143 lb 14.4 oz (65.3 kg)      GENERAL: Well-appearing elderly African-American male, alert, no distress and comfortable SKIN: skin color, texture, turgor are normal, no rashes or significant lesions EYES: conjunctiva are pink and non-injected, sclera clear LUNGS: clear to auscultation and percussion with normal breathing effort HEART: regular rate & rhythm and no murmurs and no lower extremity edema Musculoskeletal: no  cyanosis of digits and no clubbing  PSYCH: alert & oriented x 3, fluent speech NEURO: no focal motor/sensory deficits  LABORATORY DATA:  I have reviewed the data as listed    Latest Ref Rng & Units 06/08/2023    8:19 AM 06/01/2023    8:30 AM 05/25/2023   10:48 AM  CBC  WBC 4.0 - 10.5 K/uL 8.8  10.4  11.3   Hemoglobin 13.0 - 17.0 g/dL 8.7  8.2  8.6   Hematocrit 39.0 - 52.0 % 26.6  25.1  26.3   Platelets 150 - 400 K/uL 275  566  578        Latest Ref Rng & Units 06/08/2023    8:19 AM 06/01/2023    8:30 AM 05/25/2023   10:48 AM  CMP  Glucose 70 - 99 mg/dL 95  93  161   BUN 8 - 23 mg/dL 9  6  9    Creatinine 0.61 - 1.24 mg/dL 0.96  0.45  4.09   Sodium 135 - 145 mmol/L 136  133  136   Potassium 3.5 - 5.1 mmol/L 3.7  3.9  4.3   Chloride 98 - 111 mmol/L 100  100  106   CO2 22 - 32 mmol/L 28  27  26    Calcium  8.9 - 10.3 mg/dL 9.2  8.9  8.9   Total Protein 6.5 - 8.1 g/dL 6.6  6.6  6.1   Total Bilirubin 0.0 - 1.2 mg/dL  0.2  0.2  0.2   Alkaline Phos 38 - 126 U/L 72  85  78   AST 15 - 41 U/L 16  29  14    ALT 0 - 44 U/L 12  23  11      Lab Results  Component Value Date   MPROTEIN Not Observed 04/28/2021   Lab Results  Component Value Date   KPAFRELGTCHN 33.6 (H) 04/28/2021   LAMBDASER 22.0 04/28/2021   KAPLAMBRATIO 1.53 04/28/2021    RADIOGRAPHIC STUDIES: VAS US  ABI WITH/WO TBI Result Date: 05/30/2023  LOWER EXTREMITY DOPPLER STUDY Patient Name:  Terry Page.  Date of Exam:   05/30/2023 Medical Rec #: [id number]             Accession #:    [id number] Date of Birth: 1955/07/11              Patient Gender: M Patient Age:   1 years Exam Location:  [address] Procedure:      VAS US  ABI WITH/WO TBI Referring Phys: ADAM MCDONALD --------------------------------------------------------------------------------  Indications: Claudication, and ulceration. Nocturnal right pedal parasthesia High Risk Factors: Hypertension, hyperlipidemia, past history of smoking. Other Factors: Ulcerations  of both heels.  Performing Technologist: Homer Lust RVT  Examination Guidelines: A complete evaluation includes at minimum, Doppler waveform signals and systolic blood pressure reading at the level of bilateral brachial, anterior tibial, and posterior tibial arteries, when vessel segments are accessible. Bilateral testing is considered an integral part of a complete examination. Photoelectric Plethysmograph (PPG) waveforms and toe systolic pressure readings are included as required and additional duplex testing as needed. Limited examinations for reoccurring indications may be performed as noted.  ABI Findings: +---------+------------------+-----+----------+--------+ Right    Rt Pressure (mmHg)IndexWaveform  Comment  +---------+------------------+-----+----------+--------+ Brachial 124                                       +---------+------------------+-----+----------+--------+ PTA      98                0.75 monophasic         +---------+------------------+-----+----------+--------+ DP       100               0.76 monophasic         +---------+------------------+-----+----------+--------+ Great Toe50                0.38 Abnormal           +---------+------------------+-----+----------+--------+ +---------+------------------+-----+----------+-------+ Left     Lt Pressure (mmHg)IndexWaveform  Comment +---------+------------------+-----+----------+-------+ Brachial 131                                      +---------+------------------+-----+----------+-------+ PTA      64                0.49 monophasic        +---------+------------------+-----+----------+-------+ DP       111               0.85 monophasic        +---------+------------------+-----+----------+-------+ Great Toe55                0.42 Abnormal          +---------+------------------+-----+----------+-------+ +-------+-----------+-----------+------------+------------+ ABI/TBIToday's  ABIToday's TBIPrevious ABIPrevious TBI +-------+-----------+-----------+------------+------------+ Right  0.76  0.38       0.78        0.59         +-------+-----------+-----------+------------+------------+ Left   0.85       0.42       0.84        0.75         +-------+-----------+-----------+------------+------------+  Bilateral ABIs appear essentially unchanged. Bilateral TBIs appear decreased.  Summary: Right: Resting right ankle-brachial index indicates moderate right lower extremity arterial disease. The right toe-brachial index is abnormal. Left: Resting left ankle-brachial index indicates mild left lower extremity arterial disease. The left toe-brachial index is abnormal. *See table(s) above for measurements and observations.  Electronically signed by Angela Kell MD on 05/30/2023 at 5:06:59 PM.    Final      ASSESSMENT & PLAN Terry Page. Is a 68 y.o.  male with medical history significant for metastatic adenocarcinoma of the lung who presents for a follow up visit.  # Metastatic Adenocarcinoma of the Lung  -- NGS testing from this patient shows no evidence of targetable mutation. --MRI brain shows no evidence of intracranial metastases --Started carboplatin , pembrolizumab , and pemetrexed  as treatment for his cancer on 07/08/2021. --Received palliative radiation to osseous metastasis of the left shoulder from 08/11/2021-08/24/2021. Planned dose is 30 Gy in 10 fx.  --Discontinued Pemetrexed  on 03/17/2022 due to persistent anemia and kidney dysfunction. --Restaging CT CAP from 08/09/2022 shows interval enlargement of multiple mixed lytic and sclerotic bone metastases involving the right pelvis. Other bone metastases including of the left scapula, sacrum, and left hemipelvis are unchanged. Unchanged treated central LUL mass. Recommend to continue on pembrolizumab  therapy and arrange for palliative radiation to enlarging bone metastases.  --CTA chest from 04/20/2023 showed  progression of disease to the consolidation in the left apex. Recommended to change therapy  to single agent Gemcitabine  3 weeks on, 1 week off.  --Started Cycle 1, Day 1 of Gemcitabine  on 04/27/2023 Plan: --Due for Cycle 2, Day 15 of Gemcitabine  today.  --labs from today were reviewed. Labs show white blood cell count 8.8, hemoglobin 8.7, MCV 90.8, platelets 275. Creatinine and LFTs normal.  --No prohibitive toxicities identified so proceed with treatment without any dose modification --RTC for labs and visit prior to Cycle 3, Day 1  #Leukocytosis-neutrophil predominant: --Patient denies any infectious symptoms, elevation is likely secondary to growth factor --Held GCSF injection with Cycle 4 of Ram/Docetaxel . Continue to hold injection for future cycles.   #Left lower eyelid stye--resolved: --Applying warm compresses, recommend to continue --Completed 7 day course of doxycycline  100 mg BID.   #Left sided low back pain with sciatica: --History of lumbar spondylosis and moderate to severe facet arthrosis without spinal stenosis and mild right neural foraminal stenosis at L4-5 seen on MRI lumbar 04/24/2020.   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  --Pain is slightly improved but still present.  --Continue to take tramadol  q 6 hours for pain control.   #Hyponatremia-improved --Chronic in nature --Sodium level dropped from 127 on 08/09/2022 to 118.  Today sodium stable to 136 --Etiologies including SIADH, immunotherapy induced, too much water intake.  --Patient is asymptomatic without nausea/vomiting, headaches, confusion, seizures, etc --Currently on salt tablets 1gm three times daily.  --Strict ED precautions given for any new symptoms as discussed above.   # Iron Deficiency Anemia #Chemotherapy induced  anemia --Currently on ferrous sulfate  325 mg PO daily --No evidence of iron/b12/folate deficiency per labs form 04/22/2023.   #Pain Control --stopped oxycodone  due to itching --currently following with Palliative care.  --pain well controlled with tramadol  as needed. Continue to take tramdol 50-100 mg q 6 hours as needed. --Continue senna docusate and miralax  for constipation prophylaxis --Continue to monitor  #Supportive Care -- chemotherapy education complete -- port placed -- zofran  8mg  q8H PRN and compazine  10mg  PO q6H for nausea -- EMLA  cream for port -- Patient referred to palliative care for pain control. Pain control as above.    No orders of the defined types were placed in this encounter.   All questions were answered. The patient knows to call the clinic with any problems, questions or concerns.  I have spent a total of 30 minutes minutes of face-to-face and non-face-to-face time, preparing to see the patient, performing a medically appropriate examination, counseling and educating the patient, referring and communicating with other health care professionals, documenting clinical information in the electronic health record, and care coordination.   Rogerio Clay, MD Department of Hematology/Oncology [facility] Phone: [phone] Pager: [phone] Email: [email or web address]  06/10/2023 5:07 PM

## 2023-06-10 ENCOUNTER — Encounter: Payer: Self-pay | Admitting: Hematology and Oncology

## 2023-06-12 ENCOUNTER — Ambulatory Visit: Admitting: Neurology

## 2023-06-18 ENCOUNTER — Other Ambulatory Visit: Payer: Self-pay

## 2023-06-18 DIAGNOSIS — C3490 Malignant neoplasm of unspecified part of unspecified bronchus or lung: Secondary | ICD-10-CM

## 2023-06-18 DIAGNOSIS — C3412 Malignant neoplasm of upper lobe, left bronchus or lung: Secondary | ICD-10-CM

## 2023-06-18 DIAGNOSIS — Z515 Encounter for palliative care: Secondary | ICD-10-CM

## 2023-06-18 DIAGNOSIS — G893 Neoplasm related pain (acute) (chronic): Secondary | ICD-10-CM

## 2023-06-18 MED ORDER — DRONABINOL 5 MG PO CAPS: 5.0000 mg | ORAL_CAPSULE | Freq: Two times a day (BID) | ORAL | 0 refills | Status: DC

## 2023-06-18 MED ORDER — TRAMADOL HCL 50 MG PO TABS: 50.0000 mg | ORAL_TABLET | Freq: Four times a day (QID) | ORAL | 0 refills | Status: DC | PRN

## 2023-06-18 NOTE — Telephone Encounter (Signed)
 Pt called for medication refills, see associated orders.

## 2023-06-19 ENCOUNTER — Ambulatory Visit: Admitting: Podiatry

## 2023-06-19 DIAGNOSIS — L97411 Non-pressure chronic ulcer of right heel and midfoot limited to breakdown of skin: Secondary | ICD-10-CM

## 2023-06-19 DIAGNOSIS — I739 Peripheral vascular disease, unspecified: Secondary | ICD-10-CM

## 2023-06-19 DIAGNOSIS — L97421 Non-pressure chronic ulcer of left heel and midfoot limited to breakdown of skin: Secondary | ICD-10-CM

## 2023-06-20 NOTE — Progress Notes (Signed)
 Subjective:  Patient ID: Terry Drier., male    DOB: 09-13-1955,  MRN: [id number]  Chief Complaint  Patient presents with   Wound Check          Patient is healed and doing well    68 y.o. male presents with the above complaint. History confirmed with patient.  Notes that is doing much better   Objective:  Physical Exam: warm, good capillary refill, DP reduced bilateral, nail exam onychomycosis of the toenails, PT reduced bilateral, and both ulcerations fully healed        ABI Findings:  +---------+------------------+-----+----------+--------+  Right   Rt Pressure (mmHg)IndexWaveform  Comment   +---------+------------------+-----+----------+--------+  Brachial 124                                        +---------+------------------+-----+----------+--------+  PTA     98                0.75 monophasic          +---------+------------------+-----+----------+--------+  DP      100               0.76 monophasic          +---------+------------------+-----+----------+--------+  Great Toe50                0.38 Abnormal            +---------+------------------+-----+----------+--------+   +---------+------------------+-----+----------+-------+  Left    Lt Pressure (mmHg)IndexWaveform  Comment  +---------+------------------+-----+----------+-------+  Brachial 131                                       +---------+------------------+-----+----------+-------+  PTA     64                0.49 monophasic         +---------+------------------+-----+----------+-------+  DP      111               0.85 monophasic         +---------+------------------+-----+----------+-------+  Great Toe55                0.42 Abnormal           +---------+------------------+-----+----------+-------+   +-------+-----------+-----------+------------+------------+  ABI/TBIToday's ABIToday's TBIPrevious ABIPrevious TBI   +-------+-----------+-----------+------------+------------+  Right 0.76       0.38       0.78        0.59          +-------+-----------+-----------+------------+------------+  Left  0.85       0.42       0.84        0.75          +-------+-----------+-----------+------------+------------+         Bilateral ABIs appear essentially unchanged. Bilateral TBIs appear  decreased.    Summary:  Right: Resting right ankle-brachial index indicates moderate right lower  extremity arterial disease. The right toe-brachial index is abnormal.   Left: Resting left ankle-brachial index indicates mild left lower  extremity arterial disease. The left toe-brachial index is abnormal.    Assessment:   1. Ulcer of left heel and midfoot, limited to breakdown of skin ([name])   2. Ulcer of heel, right, limited to breakdown of skin ([name])   3. PAD (peripheral artery disease) ([name])  Plan:  Patient was evaluated and treated and all questions answered.  Doing well ulcerations have healed.  May leave open to air.  Skin moisturize as needed.  Follow-up as needed and monitor for recurrence avoid pressure on heels long-term  No follow-ups on file.

## 2023-06-21 NOTE — Progress Notes (Unsigned)
 [facility] [phone]   [phone]  PROGRESS NOTE  Patient Care Team: Terry Gander, MD as PCP - General (Family Medicine)  Hematological/Oncological History # Metastatic Adenocarcinoma of the Lung  01/07/2020 : CT of the chest with contrast performed which showed interval enlargement of a spiculated nodule in the central left upper lobe measuring 1.2 x 1.0 cm and highly suspicious for primary lung malignancy 02/06/2020 :PET scan performed which showed a hypermetabolic irregular solid 1.3 cm left upper lobe pulmonary nodule compatible with malignancy without any other hypermetabolic lesions 04/02/6604- 04/19/2020:  SBRT to the lung lesion 04/28/2021: CT C/A/P showed multifocal lytic bone metastases are identified. Lesion within the spine of the left scapula and lesions involving bilateral iliac bones and left sacral wing. Establish care with Terry Page  05/03/2021: biopsy of right iliac lytic lesion showed metastatic carcinoma, consistent with lung primary.  07/08/2021: Cycle 1 Day 1 of Carbo/Pem/Pem 07/29/2021: Cycle 2 Day 1 of Carbo/Pem/Pem 08/11/2021-08/24/2021: Received palliative radiation to osseous metastasis in the left shoulder. 30 Gy in 10 fractions.  08/19/2021: Cycle 3 Day 1 of Carbo/Pem/Pem 09/09/2021: Cycle 4 Day 1 of Carbo/Pem/Pem 09/23/2021: CT CAP: stable disease 09/30/2021: Cycle 5 Day 1 of Pem/Pem 10/21/2021: Cycle 6 Day 1 of Pembrolizumab . Held pemetrexed  due to anemia. 11/16/2021: Cycle 7 Day 1 of Pem/Pem.   12/16/2021: Cycle 8 Day 1 of Pem/Pem.   01/06/2022: Cycle 9 Day 1 of Pem/Pem.  01/27/2022: Cycle 10 Day 1 of Pem/Pem 02/17/2022: Cycle 11 Day 1 of Pem/Pem 03/10/2022: Cycle 12 Day 1 of Pem/Pem (HELD due to anemia with Hgb of 6.4) 03/17/2022: Cycle 12 Day 1 of Pem/Pem (HELD pemextrexed due to cytopenias/kidney function) 04/10/2022: Cycle 13 Day 1 of Pem/Pem (HELD pemextrexed due to cytopenias/kidney function) 04/28/2022: Cycle 14 Day 1 of Pem/Pem  (HELD pemextrexed due to cytopenias/kidney function) 05/23/2022:  Cycle 15 Day 1 of Pem/Pem (HELD pemextrexed due to cytopenias/kidney function) 06/15/2022: Cycle 16 Day 1 of Pembrolizumab .  07/07/2022: Cycle 17 Day 1 of Pembrolizumab .  07/28/2022: Cycle 18 Day 1 of Pembrolizumab  08/18/2022: Cycle 19 Day 1 of Pembrolizumab  HELD due to hyponatremia of 118, new bone metastases. 08/25/2022: Cycle 19 Day 1 of Pembrolizumab  08/28/2022-09/13/2022: Received palliative radiation to right pelvis with total dose of 30 Gy in 10 Fx.  09/22/2022:  Cycle 20 Day 1 of Pembrolizumab  10/13/2022:  Cycle 21 Day 1 of Pembrolizumab  12/11/2022: Cycle 1 Day 1 of Docetaxel /Ramucircumab.  01/05/2023: Cycle 2 Day 1 of Docetaxel /Ramucircumab.  01/26/2023: Cycle 3 Day 1 of Docetaxel /Ramucircumab 02/16/2023:  Cycle 4 Day 1 of Docetaxel /Ramucircumab 03/09/2023: Cycle 5 Day 1 of Docetaxel /Ramucircumab 03/30/2023:  Cycle 6 Day 1 of Docetaxel /Ramucircumab 04/20/2023-04/23/2023: Admitted for slurred speech and weakness. MRI brain concerning for stroke. CTA chest found progression of irregular consolidation in the left apex.  04/27/2023: Cycle 1, Day 1 of Gemcitabine  05/25/2023: Cycle 2, Day 1 of Gemcitabine    #Adenocarcinoma of the Prostate, T1cN0M0 08/19/2019-10/07/2019: 70 Gy in 28 fractions of 2.5 Gy.  Radiation to the prostate was under the care of Dr. Kenith Page  Interval History:  Terry Page. 68 y.o. male with medical history significant for metastatic adenocarcinoma of the lung who presents for a follow up visit. The patient's last visit was on 05/25/2023. In the interim since the last visit, he has continued on single agent gemcitabine .  On exam today, Terry Page reports he has no major plans for the summer and plans to take it easy.  He reports his granddaughter is graduating  from the sixth grade soon.  He reports overall he feels well.  He reports his energy levels are good.  He does occasionally feel tired for several  hours after his treatment but generally recovers quickly.  He notes that he became little short of breath on exertion but no shortness of breath at baseline.  He reports his appetite has been good.  He denies any nausea, vomiting, or diarrhea.  He is not having any other major side effects as a result of his chemotherapy treatment a full 10 point ROS is otherwise negative.  He is willing and able to proceed with treatment today.  MEDICAL HISTORY:  Past Medical History:  Diagnosis Date   Alcohol abuse    Asthma    Bullous emphysema ([name])    Essential hypertension 04/10/2020   Hemorrhoids    History of radiation therapy 04/08/20-04/19/20   IMRT- Left lung- Dr. Retta Page   Incidental lung nodule, greater than or equal to 8mm 10/22/2017   Left upper lobe - discovered on CTA   Prostate cancer ([name])    Spontaneous pneumothorax 10/20/2017   right   Tobacco abuse     SURGICAL HISTORY: Past Surgical History:  Procedure Laterality Date   BACK SURGERY     IR IMAGING GUIDED PORT INSERTION  06/29/2021   PROSTATE BIOPSY      SOCIAL HISTORY: Social History   Socioeconomic History   Marital status: Single    Spouse name: Not on file   Number of children: Not on file   Years of education: Not on file   Highest education level: Not on file  Occupational History   Occupation: retired  Tobacco Use   Smoking status: Former    Current packs/day: 0.00    Types: Cigarettes    Quit date: 11/22/2019    Years since quitting: 3.5   Smokeless tobacco: Never   Tobacco comments:    Patient reports quit 3 years ago. 06/25/20. [facility]  Vaping Use   Vaping status: Never Used  Substance and Sexual Activity   Alcohol use: Yes    Alcohol/week: 12.0 standard drinks of alcohol    Types: 12 Cans of beer per week    Comment: daily sometimes   Drug use: No   Sexual activity: Not Currently  Other Topics Concern   Not on file  Social History Narrative   Not on file   Social Drivers of Health    Financial Resource Strain: Not on file  Food Insecurity: No Food Insecurity (04/22/2023)   Hunger Vital Sign    Worried About Running Out of Food in the Last Year: Never true    Ran Out of Food in the Last Year: Never true  Transportation Needs: No Transportation Needs (04/22/2023)   PRAPARE - Administrator, Civil Service (Medical): No    Lack of Transportation (Non-Medical): No  Recent Concern: Transportation Needs - Unmet Transportation Needs (04/21/2023)   PRAPARE - Administrator, Civil Service (Medical): Yes    Lack of Transportation (Non-Medical): No  Physical Activity: Not on file  Stress: Not on file  Social Connections: Moderately Integrated (04/21/2023)   Social Connection and Isolation Panel    Frequency of Communication with Friends and Family: More than three times a week    Frequency of Social Gatherings with Friends and Family: More than three times a week    Attends Religious Services: 1 to 4 times per year    Active Member of [employer]  or Organizations: No    Attends Banker Meetings: Never    Marital Status: Married  Catering manager Violence: Not At Risk (04/22/2023)   Humiliation, Afraid, Rape, and Kick questionnaire    Fear of Current or Ex-Partner: No    Emotionally Abused: No    Physically Abused: No    Sexually Abused: No    FAMILY HISTORY: Family History  Problem Relation Age of Onset   Cancer Cousin        maternal cousin   Cancer Cousin        paternal cousin   Cancer Cousin    Colon polyps Neg Hx    Pancreatic disease Neg Hx    Pancreatic cancer Neg Hx    Breast cancer Neg Hx    Colon cancer Neg Hx     ALLERGIES:  is allergic to morphine  and oxycodone .  MEDICATIONS:  Current Outpatient Medications  Medication Sig Dispense Refill   acetaminophen  (TYLENOL ) 325 MG tablet Take 2 tablets (650 mg total) by mouth every 6 (six) hours as needed for mild pain or headache (fever >/= 101).     albuterol  (VENTOLIN  HFA)  108 (90 Base) MCG/ACT inhaler Inhale 2 puffs into the lungs every 6 (six) hours as needed for wheezing or shortness of breath.     [Paused] amLODipine  (NORVASC ) 10 MG tablet Take 1 tablet by mouth daily.     aspirin  EC 325 MG tablet Take 1 tablet (325 mg total) by mouth daily. 30 tablet 1   atorvastatin  (LIPITOR) 40 MG tablet Take 1 tablet (40 mg total) by mouth daily. 30 tablet 1   cyanocobalamin  (VITAMIN B12) 1000 MCG tablet Take 1 tablet (1,000 mcg total) by mouth daily. (Patient taking differently: Take 1,000 mcg by mouth as needed (When directed by physican).) 90 tablet 1   dronabinol  (MARINOL ) 5 MG capsule Take 1 capsule (5 mg total) by mouth 2 (two) times daily before lunch and supper. 60 capsule 0   ferrous sulfate  325 (65 FE) MG EC tablet Take 1 tablet (325 mg total) by mouth daily with breakfast. 30 tablet 3   folic acid  (FOLVITE ) 1 MG tablet Take 1 tablet (1 mg total) by mouth daily. 90 tablet 3   hydrOXYzine  (ATARAX ) 25 MG tablet Take 1 tablet (25 mg total) by mouth at bedtime as needed for itching. 30 tablet 11   ipratropium-albuterol  (DUONEB) 0.5-2.5 (3) MG/3ML SOLN Take 3 mLs by nebulization every 6 (six) hours as needed. (Patient taking differently: Take 3 mLs by nebulization every 6 (six) hours as needed (shortness of breathing or wheezing).) 360 mL 0   lidocaine  (LIDODERM ) 5 % Place 1 patch onto the skin daily. Remove & Discard patch within 12 hours or as directed by MD. Apply to low back 30 patch 0   lidocaine -prilocaine  (EMLA ) cream Apply 1 Application topically as needed. 30 g 0   [Paused] olmesartan (BENICAR) 40 MG tablet Take 40 mg by mouth daily.     polyethylene glycol (MIRALAX ) 17 g packet Take 17 g by mouth daily. 30 each 2   potassium chloride  SA (KLOR-CON  M) 20 MEQ tablet TAKE 1 TABLET(20 MEQ) BY MOUTH DAILY 90 tablet 0   SENNA S 8.6-50 MG tablet Take 2 tablets by mouth at bedtime as needed for mild constipation or moderate constipation.     senna-docusate (STIMULANT  LAXATIVE) 8.6-50 MG tablet Take 2 tablets by mouth 2 (two) times daily. May add additional tablet as needed 150 tablet 3   sodium  chloride 1 g tablet Take 1 tablet (1 g total) by mouth 3 (three) times daily with meals. 90 tablet 3   thiamine  100 MG tablet Take 1 tablet (100 mg total) by mouth daily.     traMADol  (ULTRAM ) 50 MG tablet Take 1-2 tablets (50-100 mg total) by mouth every 6 (six) hours as needed. 120 tablet 0   No current facility-administered medications for this visit.    REVIEW OF SYSTEMS:   Constitutional: ( - ) fevers, ( - )  chills , ( - ) night sweats Eyes: ( - ) blurriness of vision, ( - ) double vision, ( - ) watery eyes Ears, nose, mouth, throat, and face: ( - ) mucositis, ( - ) sore throat Respiratory: ( - ) cough, ( - ) dyspnea, ( - ) wheezes Cardiovascular: ( - ) palpitation, ( - ) chest discomfort, ( - ) lower extremity swelling Gastrointestinal:  ( - ) nausea, ( - ) heartburn, ( - ) change in bowel habits Skin: ( - ) abnormal skin rashes Lymphatics: ( - ) new lymphadenopathy, ( - ) easy bruising Neurological: ( - ) numbness, ( - ) tingling, ( - ) new weaknesses Behavioral/Psych: ( - ) mood change, ( - ) new changes  All other systems were reviewed with the patient and are negative.  PHYSICAL EXAMINATION: ECOG PERFORMANCE STATUS: 1 - Symptomatic but completely ambulatory  There were no vitals filed for this visit.       There were no vitals filed for this visit.     GENERAL: Well-appearing elderly African-American male, alert, no distress and comfortable SKIN: skin color, texture, turgor are normal, no rashes or significant lesions EYES: conjunctiva are pink and non-injected, sclera clear LUNGS: clear to auscultation and percussion with normal breathing effort HEART: regular rate & rhythm and no murmurs and no lower extremity edema Musculoskeletal: no cyanosis of digits and no clubbing  PSYCH: alert & oriented x 3, fluent speech NEURO: no focal  motor/sensory deficits  LABORATORY DATA:  I have reviewed the data as listed    Latest Ref Rng & Units 06/08/2023    8:19 AM 06/01/2023    8:30 AM 05/25/2023   10:48 AM  CBC  WBC 4.0 - 10.5 K/uL 8.8  10.4  11.3   Hemoglobin 13.0 - 17.0 g/dL 8.7  8.2  8.6   Hematocrit 39.0 - 52.0 % 26.6  25.1  26.3   Platelets 150 - 400 K/uL 275  566  578        Latest Ref Rng & Units 06/08/2023    8:19 AM 06/01/2023    8:30 AM 05/25/2023   10:48 AM  CMP  Glucose 70 - 99 mg/dL 95  93  130   BUN 8 - 23 mg/dL 9  6  9    Creatinine 0.61 - 1.24 mg/dL 8.65  7.84  6.96   Sodium 135 - 145 mmol/L 136  133  136   Potassium 3.5 - 5.1 mmol/L 3.7  3.9  4.3   Chloride 98 - 111 mmol/L 100  100  106   CO2 22 - 32 mmol/L 28  27  26    Calcium  8.9 - 10.3 mg/dL 9.2  8.9  8.9   Total Protein 6.5 - 8.1 g/dL 6.6  6.6  6.1   Total Bilirubin 0.0 - 1.2 mg/dL 0.2  0.2  0.2   Alkaline Phos 38 - 126 U/L 72  85  78   AST 15 - 41 U/L  16  29  14    ALT 0 - 44 U/L 12  23  11      Lab Results  Component Value Date   MPROTEIN Not Observed 04/28/2021   Lab Results  Component Value Date   KPAFRELGTCHN 33.6 (H) 04/28/2021   LAMBDASER 22.0 04/28/2021   KAPLAMBRATIO 1.53 04/28/2021    RADIOGRAPHIC STUDIES: VAS US  ABI WITH/WO TBI Result Date: 05/30/2023  LOWER EXTREMITY DOPPLER STUDY Patient Name:  NICKOLUS WADDING.  Date of Exam:   05/30/2023 Medical Rec #: [id number]             Accession #:    [id number] Date of Birth: [birth year]              Patient Gender: M Patient Age:   11 years Exam Location:  [address] Procedure:      VAS US  ABI WITH/WO TBI Referring Phys: ADAM MCDONALD --------------------------------------------------------------------------------  Indications: Claudication, and ulceration. Nocturnal right pedal parasthesia High Risk Factors: Hypertension, hyperlipidemia, past history of smoking. Other Factors: Ulcerations of both heels.  Performing Technologist: Homer Lust RVT  Examination Guidelines: A complete  evaluation includes at minimum, Doppler waveform signals and systolic blood pressure reading at the level of bilateral brachial, anterior tibial, and posterior tibial arteries, when vessel segments are accessible. Bilateral testing is considered an integral part of a complete examination. Photoelectric Plethysmograph (PPG) waveforms and toe systolic pressure readings are included as required and additional duplex testing as needed. Limited examinations for reoccurring indications may be performed as noted.  ABI Findings: +---------+------------------+-----+----------+--------+ Right    Rt Pressure (mmHg)IndexWaveform  Comment  +---------+------------------+-----+----------+--------+ Brachial 124                                       +---------+------------------+-----+----------+--------+ PTA      98                0.75 monophasic         +---------+------------------+-----+----------+--------+ DP       100               0.76 monophasic         +---------+------------------+-----+----------+--------+ Great Toe50                0.38 Abnormal           +---------+------------------+-----+----------+--------+ +---------+------------------+-----+----------+-------+ Left     Lt Pressure (mmHg)IndexWaveform  Comment +---------+------------------+-----+----------+-------+ Brachial 131                                      +---------+------------------+-----+----------+-------+ PTA      64                0.49 monophasic        +---------+------------------+-----+----------+-------+ DP       111               0.85 monophasic        +---------+------------------+-----+----------+-------+ Great Toe55                0.42 Abnormal          +---------+------------------+-----+----------+-------+ +-------+-----------+-----------+------------+------------+ ABI/TBIToday's ABIToday's TBIPrevious ABIPrevious TBI +-------+-----------+-----------+------------+------------+  Right  0.76       0.38       0.78        0.59         +-------+-----------+-----------+------------+------------+  Left   0.85       0.42       0.84        0.75         +-------+-----------+-----------+------------+------------+  Bilateral ABIs appear essentially unchanged. Bilateral TBIs appear decreased.  Summary: Right: Resting right ankle-brachial index indicates moderate right lower extremity arterial disease. The right toe-brachial index is abnormal. Left: Resting left ankle-brachial index indicates mild left lower extremity arterial disease. The left toe-brachial index is abnormal. *See table(s) above for measurements and observations.  Electronically signed by Angela Kell MD on 05/30/2023 at 5:06:59 PM.    Final      ASSESSMENT & PLAN Terry Page. Is a 68 y.o.  male with medical history significant for metastatic adenocarcinoma of the lung who presents for a follow up visit.  # Metastatic Adenocarcinoma of the Lung  -- NGS testing from this patient shows no evidence of targetable mutation. --MRI brain shows no evidence of intracranial metastases --Started carboplatin , pembrolizumab , and pemetrexed  as treatment for his cancer on 07/08/2021. --Received palliative radiation to osseous metastasis of the left shoulder from 08/11/2021-08/24/2021. Planned dose is 30 Gy in 10 fx.  --Discontinued Pemetrexed  on 03/17/2022 due to persistent anemia and kidney dysfunction. --Restaging CT CAP from 08/09/2022 shows interval enlargement of multiple mixed lytic and sclerotic bone metastases involving the right pelvis. Other bone metastases including of the left scapula, sacrum, and left hemipelvis are unchanged. Unchanged treated central LUL mass. Recommend to continue on pembrolizumab  therapy and arrange for palliative radiation to enlarging bone metastases.  --CTA chest from 04/20/2023 showed progression of disease to the consolidation in the left apex. Recommended to change therapy  to single  agent Gemcitabine  3 weeks on, 1 week off.  --Started Cycle 1, Day 1 of Gemcitabine  on 04/27/2023 Plan: --Due for Cycle 3, Day 1 of Gemcitabine  today.  --labs from today were reviewed. Labs show white blood cell count 8.8, hemoglobin 8.7, MCV 90.8, platelets 275. Creatinine and LFTs normal.  --No prohibitive toxicities identified so proceed with treatment without any dose modification --RTC for labs and visit prior to Cycle 3, Day 15  #Leukocytosis-neutrophil predominant: --Patient denies any infectious symptoms, elevation is likely secondary to growth factor --Held GCSF injection with Cycle 4 of Ram/Docetaxel . Continue to hold injection for future cycles.   #Left lower eyelid stye--resolved: --Applying warm compresses, recommend to continue --Completed 7 day course of doxycycline  100 mg BID.   #Left sided low back pain with sciatica: --History of lumbar spondylosis and moderate to severe facet arthrosis without spinal stenosis and mild right neural foraminal stenosis at L4-5 seen on MRI lumbar 04/24/2020.   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  --Pain is slightly improved but still present.  --Continue to take tramadol  q 6 hours for pain control.   #Hyponatremia-improved --Chronic in nature --Sodium level dropped from 127 on 08/09/2022 to 118.  Today sodium stable to 136 --Etiologies including SIADH, immunotherapy induced, too much water intake.  --Patient is asymptomatic without nausea/vomiting, headaches, confusion, seizures, etc --Currently on salt tablets 1gm three times daily.  --Strict ED precautions given for any new symptoms as discussed above.   # Iron Deficiency Anemia #Chemotherapy induced anemia --Currently on ferrous sulfate  325 mg PO daily --No evidence of iron/b12/folate deficiency per  labs form 04/22/2023.   #Pain Control --stopped oxycodone   due to itching --currently following with Palliative care.  --pain well controlled with tramadol  as needed. Continue to take tramdol 50-100 mg q 6 hours as needed. --Continue senna docusate and miralax  for constipation prophylaxis --Continue to monitor  #Supportive Care -- chemotherapy education complete -- port placed -- zofran  8mg  q8H PRN and compazine  10mg  PO q6H for nausea -- EMLA  cream for port -- Patient referred to palliative care for pain control. Pain control as above.    No orders of the defined types were placed in this encounter.   All questions were answered. The patient knows to call the clinic with any problems, questions or concerns.  I have spent a total of 30 minutes minutes of face-to-face and non-face-to-face time, preparing to see the patient, performing a medically appropriate examination, counseling and educating the patient, referring and communicating with other health care professionals, documenting clinical information in the electronic health record, and care coordination.   Rogerio Clay, MD Department of Hematology/Oncology [facility] Phone: [phone] Pager: [phone] Email: [email or web address]  06/21/2023 10:34 PM

## 2023-06-22 ENCOUNTER — Inpatient Hospital Stay

## 2023-06-22 ENCOUNTER — Inpatient Hospital Stay: Admitting: Hematology and Oncology

## 2023-06-22 ENCOUNTER — Encounter

## 2023-06-22 ENCOUNTER — Ambulatory Visit

## 2023-06-22 VITALS — HR 88

## 2023-06-22 VITALS — BP 106/75 | HR 116 | Temp 98.0°F | Resp 17 | Wt 140.3 lb

## 2023-06-22 DIAGNOSIS — D649 Anemia, unspecified: Secondary | ICD-10-CM | POA: Diagnosis not present

## 2023-06-22 DIAGNOSIS — C3412 Malignant neoplasm of upper lobe, left bronchus or lung: Secondary | ICD-10-CM

## 2023-06-22 DIAGNOSIS — Z95828 Presence of other vascular implants and grafts: Secondary | ICD-10-CM

## 2023-06-22 DIAGNOSIS — G893 Neoplasm related pain (acute) (chronic): Secondary | ICD-10-CM | POA: Diagnosis not present

## 2023-06-22 DIAGNOSIS — Z5111 Encounter for antineoplastic chemotherapy: Secondary | ICD-10-CM | POA: Diagnosis not present

## 2023-06-22 LAB — CBC WITH DIFFERENTIAL (CANCER CENTER ONLY)
Abs Immature Granulocytes: 0.15 10*3/uL — ABNORMAL HIGH (ref 0.00–0.07)
Basophils Absolute: 0.1 10*3/uL (ref 0.0–0.1)
Basophils Relative: 1 %
Eosinophils Absolute: 1 10*3/uL — ABNORMAL HIGH (ref 0.0–0.5)
Eosinophils Relative: 8 %
HCT: 27.5 % — ABNORMAL LOW (ref 39.0–52.0)
Hemoglobin: 9.1 g/dL — ABNORMAL LOW (ref 13.0–17.0)
Immature Granulocytes: 1 %
Lymphocytes Relative: 9 %
Lymphs Abs: 1.1 10*3/uL (ref 0.7–4.0)
MCH: 30 pg (ref 26.0–34.0)
MCHC: 33.1 g/dL (ref 30.0–36.0)
MCV: 90.8 fL (ref 80.0–100.0)
Monocytes Absolute: 2.1 10*3/uL — ABNORMAL HIGH (ref 0.1–1.0)
Monocytes Relative: 16 %
Neutro Abs: 8.4 10*3/uL — ABNORMAL HIGH (ref 1.7–7.7)
Neutrophils Relative %: 65 %
Platelet Count: 601 10*3/uL — ABNORMAL HIGH (ref 150–400)
RBC: 3.03 MIL/uL — ABNORMAL LOW (ref 4.22–5.81)
RDW: 22.1 % — ABNORMAL HIGH (ref 11.5–15.5)
WBC Count: 12.8 10*3/uL — ABNORMAL HIGH (ref 4.0–10.5)
nRBC: 0.2 % (ref 0.0–0.2)

## 2023-06-22 LAB — CMP (CANCER CENTER ONLY)
ALT: 8 U/L (ref 0–44)
AST: 15 U/L (ref 15–41)
Albumin: 3.2 g/dL — ABNORMAL LOW (ref 3.5–5.0)
Alkaline Phosphatase: 78 U/L (ref 38–126)
Anion gap: 8 (ref 5–15)
BUN: 10 mg/dL (ref 8–23)
CO2: 27 mmol/L (ref 22–32)
Calcium: 9.2 mg/dL (ref 8.9–10.3)
Chloride: 100 mmol/L (ref 98–111)
Creatinine: 0.88 mg/dL (ref 0.61–1.24)
GFR, Estimated: >60 mL/min (ref >60)
Glucose, Bld: 84 mg/dL (ref 70–99)
Potassium: 4 mmol/L (ref 3.5–5.1)
Sodium: 135 mmol/L (ref 135–145)
Total Bilirubin: 0.2 mg/dL (ref 0.0–1.2)
Total Protein: 6.7 g/dL (ref 6.5–8.1)

## 2023-06-22 LAB — SAMPLE TO BLOOD BANK

## 2023-06-22 MED ORDER — SODIUM CHLORIDE 0.9% FLUSH
10.0000 mL | Freq: Once | INTRAVENOUS | Status: AC
  Administered 2023-06-22: 10 mL

## 2023-06-22 MED ORDER — PROCHLORPERAZINE MALEATE 10 MG PO TABS
10.0000 mg | ORAL_TABLET | Freq: Once | ORAL | Status: AC
  Administered 2023-06-22: 10 mg via ORAL

## 2023-06-22 MED ORDER — SODIUM CHLORIDE 0.9 % IV SOLN
1000.0000 mg/m2 | Freq: Once | INTRAVENOUS | Status: AC
  Administered 2023-06-22: 1824 mg via INTRAVENOUS
  Filled 2023-06-22: qty 47.97

## 2023-06-22 MED ORDER — SODIUM CHLORIDE 0.9 % IV SOLN: INTRAVENOUS | Status: DC

## 2023-06-22 NOTE — Patient Instructions (Signed)
 [facility] - A DEPT OF [facility]  Discharge Instructions: Thank you for choosing [facility] to provide your oncology and hematology care.   If you have a lab appointment with the Cancer Center, please go directly to the Cancer Center and check in at the registration area.   Wear comfortable clothing and clothing appropriate for easy access to any Portacath or PICC line.   We strive to give you quality time with your provider. You may need to reschedule your appointment if you arrive late (15 or more minutes).  Arriving late affects you and other patients whose appointments are after yours.  Also, if you miss three or more appointments without notifying the office, you may be dismissed from the clinic at the provider's discretion.      For prescription refill requests, have your pharmacy contact our office and allow 72 hours for refills to be completed.    Today you received the following chemotherapy and/or immunotherapy agents: Gemcitabine.       To help prevent nausea and vomiting after your treatment, we encourage you to take your nausea medication as directed.  BELOW ARE SYMPTOMS THAT SHOULD BE REPORTED IMMEDIATELY: *FEVER GREATER THAN 100.4 F (38 C) OR HIGHER *CHILLS OR SWEATING *NAUSEA AND VOMITING THAT IS NOT CONTROLLED WITH YOUR NAUSEA MEDICATION *UNUSUAL SHORTNESS OF BREATH *UNUSUAL BRUISING OR BLEEDING *URINARY PROBLEMS (pain or burning when urinating, or frequent urination) *BOWEL PROBLEMS (unusual diarrhea, constipation, pain near the anus) TENDERNESS IN MOUTH AND THROAT WITH OR WITHOUT PRESENCE OF ULCERS (sore throat, sores in mouth, or a toothache) UNUSUAL RASH, SWELLING OR PAIN  UNUSUAL VAGINAL DISCHARGE OR ITCHING   Items with * indicate a potential emergency and should be followed up as soon as possible or go to the Emergency Department if any problems should occur.  Please show the CHEMOTHERAPY ALERT CARD or  IMMUNOTHERAPY ALERT CARD at check-in to the Emergency Department and triage nurse.  Should you have questions after your visit or need to cancel or reschedule your appointment, please contact [facility] - A DEPT [facility]  Dept: [phone]  and follow the prompts.  Office hours are 8:00 a.m. to 4:30 p.m. Monday - Friday. Please note that voicemails left after 4:00 p.m. may not be returned until the following business day.  We are closed weekends and major holidays. You have access to a nurse at all times for urgent questions. Please call the main number to the clinic Dept: [phone] and follow the prompts.   For any non-urgent questions, you may also contact your provider using MyChart. We now offer e-Visits for anyone 19 and older to request care online for non-urgent symptoms. For details visit [email or web address].   Also download the MyChart app! Go to the app store, search "MyChart", open the app, select [facility], and log in with your MyChart username and password.

## 2023-06-25 ENCOUNTER — Encounter: Payer: Self-pay | Admitting: Nurse Practitioner

## 2023-06-25 ENCOUNTER — Inpatient Hospital Stay (HOSPITAL_BASED_OUTPATIENT_CLINIC_OR_DEPARTMENT_OTHER): Admitting: Nurse Practitioner

## 2023-06-25 DIAGNOSIS — Z515 Encounter for palliative care: Secondary | ICD-10-CM | POA: Diagnosis not present

## 2023-06-25 DIAGNOSIS — G893 Neoplasm related pain (acute) (chronic): Secondary | ICD-10-CM

## 2023-06-25 DIAGNOSIS — C7951 Secondary malignant neoplasm of bone: Secondary | ICD-10-CM

## 2023-06-25 DIAGNOSIS — C3490 Malignant neoplasm of unspecified part of unspecified bronchus or lung: Secondary | ICD-10-CM | POA: Diagnosis not present

## 2023-06-26 ENCOUNTER — Encounter: Payer: Self-pay | Admitting: Hematology and Oncology

## 2023-06-26 NOTE — Progress Notes (Signed)
 Palliative Medicine [facility]  [phone] [phone]   Name: Terry Page. Date: 06/26/2023 MRN: [id number]  DOB: 1955-02-23  Patient Care Team: Rolinda Millman, MD as PCP - General (Family Medicine)   I connected with Terry Page. on 06/25/23 at  4:00 PM EDT by telephone and verified that I am speaking with the correct person using two identifiers.   I discussed the limitations, risks, security and privacy concerns of performing an evaluation and management service by telemedicine and the availability of in-person appointments. I also discussed with the patient that there may be a patient responsible charge related to this service. The patient expressed understanding and agreed to proceed.   Other persons participating in the visit and their role in the encounter: N/A   Patient's location: Home   Provider's location: [facility]   INTERVAL HISTORY: Terry Page. is a 69 y.o. 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 He is currently undergoing chemotherapy. Palliative ask to see for symptom management.   SOCIAL HISTORY:     reports that he quit smoking about 3 years ago. His smoking use included cigarettes. He has never used smokeless tobacco. He reports current alcohol use of about 12.0 standard drinks of alcohol per week. He reports that he does not use drugs.  ADVANCE DIRECTIVES:  None on file   CODE STATUS: Full code  PAST MEDICAL HISTORY: Past Medical History:  Diagnosis Date   Alcohol abuse    Asthma    Bullous emphysema ([name])    Essential hypertension 04/10/2020   Hemorrhoids    History of radiation therapy 04/08/20-04/19/20   IMRT- Left lung- Dr. Lynwood Nasuti   Incidental lung nodule, greater than or equal to 8mm 10/22/2017   Left upper lobe - discovered on CTA    Prostate cancer ([name])    Spontaneous pneumothorax 10/20/2017   right   Tobacco abuse     ALLERGIES:  is allergic to morphine  and oxycodone .  MEDICATIONS:  Current Outpatient Medications  Medication Sig Dispense Refill   acetaminophen  (TYLENOL ) 325 MG tablet Take 2 tablets (650 mg total) by mouth every 6 (six) hours as needed for mild pain or headache (fever >/= 101).     albuterol  (VENTOLIN  HFA) 108 (90 Base) MCG/ACT inhaler Inhale 2 puffs into the lungs every 6 (six) hours as needed for wheezing or shortness of breath.     [Paused] amLODipine  (NORVASC ) 10 MG tablet Take 1 tablet by mouth daily.     aspirin  EC 325 MG tablet Take 1 tablet (325 mg total) by mouth daily. 30 tablet 1   atorvastatin  (LIPITOR) 40 MG tablet Take 1 tablet (40 mg total) by mouth daily. 30 tablet 1   cyanocobalamin  (VITAMIN B12) 1000 MCG tablet Take 1 tablet (1,000 mcg total) by mouth daily. (Patient taking differently: Take 1,000 mcg by mouth as needed (When directed by physican).) 90 tablet 1   dronabinol  (MARINOL ) 5 MG capsule Take 1 capsule (5 mg total) by mouth 2 (two) times daily before lunch and supper. 60 capsule 0   ferrous sulfate  325 (65 FE) MG EC tablet Take 1 tablet (325 mg total) by mouth daily with breakfast. 30 tablet 3   folic acid  (FOLVITE ) 1 MG tablet Take 1 tablet (1 mg total) by mouth daily. 90 tablet 3   hydrOXYzine  (ATARAX ) 25 MG  tablet Take 1 tablet (25 mg total) by mouth at bedtime as needed for itching. 30 tablet 11   ipratropium-albuterol  (DUONEB) 0.5-2.5 (3) MG/3ML SOLN Take 3 mLs by nebulization every 6 (six) hours as needed. (Patient taking differently: Take 3 mLs by nebulization every 6 (six) hours as needed (shortness of breathing or wheezing).) 360 mL 0   lidocaine  (LIDODERM ) 5 % Place 1 patch onto the skin daily. Remove & Discard patch within 12 hours or as directed by MD. Apply to low back 30 patch 0   lidocaine -prilocaine  (EMLA ) cream Apply 1 Application topically as needed. 30 g 0    [Paused] olmesartan (BENICAR) 40 MG tablet Take 40 mg by mouth daily.     polyethylene glycol (MIRALAX ) 17 g packet Take 17 g by mouth daily. 30 each 2   potassium chloride  SA (KLOR-CON  M) 20 MEQ tablet TAKE 1 TABLET(20 MEQ) BY MOUTH DAILY 90 tablet 0   SENNA S 8.6-50 MG tablet Take 2 tablets by mouth at bedtime as needed for mild constipation or moderate constipation.     senna-docusate (STIMULANT LAXATIVE) 8.6-50 MG tablet Take 2 tablets by mouth 2 (two) times daily. May add additional tablet as needed 150 tablet 3   sodium chloride  1 g tablet Take 1 tablet (1 g total) by mouth 3 (three) times daily with meals. 90 tablet 3   thiamine  100 MG tablet Take 1 tablet (100 mg total) by mouth daily.     traMADol  (ULTRAM ) 50 MG tablet Take 1-2 tablets (50-100 mg total) by mouth every 6 (six) hours as needed. 120 tablet 0   No current facility-administered medications for this visit.    VITAL SIGNS: There were no vitals taken for this visit. There were no vitals filed for this visit.   Estimated body mass index is 20.72 kg/m as calculated from the following:   Height as of 05/11/23: 5' 9 (1.753 m).   Weight as of 06/22/23: 140 lb 4.8 oz (63.6 kg).   PERFORMANCE STATUS (ECOG) : 1 - Symptomatic but completely ambulatory  IMPRESSION: Discussed the use of AI scribe software for clinical note transcription with the patient, who gave verbal consent to proceed.  History of Present Illness Terry Page. is a 68 year old male  with metastatic lung cancer with bone involvement who I connected with by phone for symptom management follow-up. No acute distress. States he is doing well overall. Denies nausea, vomiting, constipation, or diarrhea. Occasional fatigue however remains as active as possible. Sleeping better at night expressing previous difficulty due to feet pain.    Feels his pain is well-controlled with use of tramadol  50-100 mg as needed in addition to lidocaine  patches.  No adjustments  to current regimen.  I discussed the importance of continued conversation with family and their medical providers regarding overall plan of care and treatment options, ensuring decisions are within the context of the patients values and GOCs. Assessment & Plan Cancer related pain/Back pain Reports pain is well controlled. No adjustments at this time.  - Tramadol  50-100mg  every 6 hours as needed -Lidocaine  patch to back as needed.  Swelling in bilateral feet Swelling in right heel improving. Son is wrapping heel ulceration.  -Maintain close follow-up with medical team as needed.   I will plan to see patient back in 3-4 weeks. Sooner if needed.   Patient expressed understanding and was in agreement with this plan. He also understands that He can call the clinic at any time with any  questions, concerns, or complaints.   Any controlled substances utilized were prescribed in the context of palliative care. PDMP has been reviewed.   Visit consisted of counseling and education dealing with the complex and emotionally intense issues of symptom management and palliative care in the setting of serious and potentially life-threatening illness.  Levon Borer, AGPCNP-BC  Palliative Medicine Team/[facility]

## 2023-06-29 ENCOUNTER — Inpatient Hospital Stay

## 2023-06-29 ENCOUNTER — Inpatient Hospital Stay: Admitting: Dietician

## 2023-06-29 VITALS — BP 132/89 | HR 100 | Temp 97.9°F | Resp 20 | Wt 138.8 lb

## 2023-06-29 DIAGNOSIS — Z5111 Encounter for antineoplastic chemotherapy: Secondary | ICD-10-CM | POA: Diagnosis not present

## 2023-06-29 DIAGNOSIS — Z95828 Presence of other vascular implants and grafts: Secondary | ICD-10-CM

## 2023-06-29 DIAGNOSIS — D649 Anemia, unspecified: Secondary | ICD-10-CM

## 2023-06-29 DIAGNOSIS — C3412 Malignant neoplasm of upper lobe, left bronchus or lung: Secondary | ICD-10-CM

## 2023-06-29 LAB — CBC WITH DIFFERENTIAL (CANCER CENTER ONLY)
Abs Immature Granulocytes: 0.1 10*3/uL — ABNORMAL HIGH (ref 0.00–0.07)
Basophils Absolute: 0.1 10*3/uL (ref 0.0–0.1)
Basophils Relative: 1 %
Eosinophils Absolute: 0.7 10*3/uL — ABNORMAL HIGH (ref 0.0–0.5)
Eosinophils Relative: 11 %
HCT: 26.5 % — ABNORMAL LOW (ref 39.0–52.0)
Hemoglobin: 8.6 g/dL — ABNORMAL LOW (ref 13.0–17.0)
Immature Granulocytes: 2 %
Lymphocytes Relative: 16 %
Lymphs Abs: 1 10*3/uL (ref 0.7–4.0)
MCH: 29.9 pg (ref 26.0–34.0)
MCHC: 32.5 g/dL (ref 30.0–36.0)
MCV: 92 fL (ref 80.0–100.0)
Monocytes Absolute: 1.2 10*3/uL — ABNORMAL HIGH (ref 0.1–1.0)
Monocytes Relative: 19 %
Neutro Abs: 3.2 10*3/uL (ref 1.7–7.7)
Neutrophils Relative %: 51 %
Platelet Count: 577 10*3/uL — ABNORMAL HIGH (ref 150–400)
RBC: 2.88 MIL/uL — ABNORMAL LOW (ref 4.22–5.81)
RDW: 21 % — ABNORMAL HIGH (ref 11.5–15.5)
WBC Count: 6.1 10*3/uL (ref 4.0–10.5)
nRBC: 0.7 % — ABNORMAL HIGH (ref 0.0–0.2)

## 2023-06-29 LAB — CMP (CANCER CENTER ONLY)
ALT: 13 U/L (ref 0–44)
AST: 20 U/L (ref 15–41)
Albumin: 3.2 g/dL — ABNORMAL LOW (ref 3.5–5.0)
Alkaline Phosphatase: 71 U/L (ref 38–126)
Anion gap: 8 (ref 5–15)
BUN: 8 mg/dL (ref 8–23)
CO2: 25 mmol/L (ref 22–32)
Calcium: 9.3 mg/dL (ref 8.9–10.3)
Chloride: 104 mmol/L (ref 98–111)
Creatinine: 0.82 mg/dL (ref 0.61–1.24)
GFR, Estimated: >60 mL/min (ref >60)
Glucose, Bld: 98 mg/dL (ref 70–99)
Potassium: 4 mmol/L (ref 3.5–5.1)
Sodium: 137 mmol/L (ref 135–145)
Total Bilirubin: 0.2 mg/dL (ref 0.0–1.2)
Total Protein: 6.9 g/dL (ref 6.5–8.1)

## 2023-06-29 LAB — SAMPLE TO BLOOD BANK

## 2023-06-29 MED ORDER — SODIUM CHLORIDE 0.9 % IV SOLN
INTRAVENOUS | Status: DC
Start: 2023-06-29 — End: 2023-06-29

## 2023-06-29 MED ORDER — SODIUM CHLORIDE 0.9 % IV SOLN: INTRAVENOUS | Status: DC

## 2023-06-29 MED ORDER — SODIUM CHLORIDE 0.9 % IV SOLN
1000.0000 mg/m2 | Freq: Once | INTRAVENOUS | Status: AC
  Administered 2023-06-29: 1824 mg via INTRAVENOUS
  Filled 2023-06-29: qty 47.97

## 2023-06-29 MED ORDER — HEPARIN SOD (PORK) LOCK FLUSH 100 UNIT/ML IV SOLN
500.0000 [IU] | Freq: Once | INTRAVENOUS | Status: AC | PRN
  Administered 2023-06-29: 500 [IU]

## 2023-06-29 MED ORDER — SODIUM CHLORIDE 0.9% FLUSH
10.0000 mL | Freq: Once | INTRAVENOUS | Status: AC
  Administered 2023-06-29: 10 mL

## 2023-06-29 MED ORDER — SODIUM CHLORIDE 0.9 % IV SOLN: INTRAVENOUS | Status: AC

## 2023-06-29 MED ORDER — PROCHLORPERAZINE MALEATE 10 MG PO TABS
10.0000 mg | ORAL_TABLET | Freq: Once | ORAL | Status: AC
  Administered 2023-06-29: 10 mg via ORAL
  Filled 2023-06-29: qty 1

## 2023-06-29 MED ORDER — SODIUM CHLORIDE 0.9% FLUSH
10.0000 mL | INTRAVENOUS | Status: DC | PRN
  Administered 2023-06-29: 10 mL

## 2023-06-29 NOTE — Progress Notes (Signed)
 Nutrition Follow-up:  Patient with metastatic lung cancer of right upper lobe. He completed 6 cycles docetaxel /ramucircumab. Pt currently receiving gemcitabine  (start 4/25)  4/18-4/21 - admit for slurred speech/weakness concerning for stroke, noted progression in left apex  Met with patient in infusion. He expresses concern about wt trends. Pt is losing wt despite eating 3 good meals + snacks. Recalls 2 boiled eggs, 2 sausage patties with orange juice for breakfast. Had 12 philly cheese steak at lunch and penne pasta with pork sausage for dinner. Recalls ham/cheese sandwich, cookies, mango juice for snack yesterday. Intake provided is typical. Says he has been drinking one Ensure Plus. He had a friend that brought him a 12 pack recently. Patient was appreciative as these are expensive. Patient drinks a few cokes and 8 cups of water. He denies nausea, vomiting, diarrhea, constipation.   Medications: reviewed   Labs: albumin 3.2  Anthropometrics: Wt 138 lb 12.8 oz today decreased ~9% in the last 13 weeks. This is clinically significant for time frame.   6/20 - 140 lb 4.8 oz 6/6 - 143 lb 14.4 oz 5/9 - 145 lb 12 oz  4/18 - 142 lb 14.4 oz 3/28 - 151 lb 3.2 oz   NUTRITION DIAGNOSIS: Unintended wt loss - ongoing    INTERVENTION:  Continue eating 3 meals + snacks Discussed ways to add calories and protein to foods (adding butter/gravy/sauces, switching to whole milk, adding ONS to ice cream for high calorie shake) Continue 1-2 Ensure - suggested Ensure Complete for added protein. Drink in between meals and at bedtime - samples + coupons     MONITORING, EVALUATION, GOAL: wt trends, intake    NEXT VISIT: Friday July 18 during infusion

## 2023-06-29 NOTE — Progress Notes (Signed)
 Per Federico MD, ok to treat with pulse 110.

## 2023-06-29 NOTE — Patient Instructions (Signed)
 [facility] - A DEPT OF [facility]  Discharge Instructions: Thank you for choosing [facility] to provide your oncology and hematology care.   If you have a lab appointment with the Cancer Center, please go directly to the Cancer Center and check in at the registration area.   Wear comfortable clothing and clothing appropriate for easy access to any Portacath or PICC line.   We strive to give you quality time with your provider. You may need to reschedule your appointment if you arrive late (15 or more minutes).  Arriving late affects you and other patients whose appointments are after yours.  Also, if you miss three or more appointments without notifying the office, you may be dismissed from the clinic at the provider's discretion.      For prescription refill requests, have your pharmacy contact our office and allow 72 hours for refills to be completed.    Today you received the following chemotherapy and/or immunotherapy agents: Gemcitabine .       To help prevent nausea and vomiting after your treatment, we encourage you to take your nausea medication as directed.  BELOW ARE SYMPTOMS THAT SHOULD BE REPORTED IMMEDIATELY: *FEVER GREATER THAN 100.4 F (38 C) OR HIGHER *CHILLS OR SWEATING *NAUSEA AND VOMITING THAT IS NOT CONTROLLED WITH YOUR NAUSEA MEDICATION *UNUSUAL SHORTNESS OF BREATH *UNUSUAL BRUISING OR BLEEDING *URINARY PROBLEMS (pain or burning when urinating, or frequent urination) *BOWEL PROBLEMS (unusual diarrhea, constipation, pain near the anus) TENDERNESS IN MOUTH AND THROAT WITH OR WITHOUT PRESENCE OF ULCERS (sore throat, sores in mouth, or a toothache) UNUSUAL RASH, SWELLING OR PAIN  UNUSUAL VAGINAL DISCHARGE OR ITCHING   Items with * indicate a potential emergency and should be followed up as soon as possible or go to the Emergency Department if any problems should occur.  Please show the CHEMOTHERAPY ALERT CARD or  IMMUNOTHERAPY ALERT CARD at check-in to the Emergency Department and triage nurse.  Should you have questions after your visit or need to cancel or reschedule your appointment, please contact [facility] - A DEPT OF [facility]  Dept: [phone]  and follow the prompts.  Office hours are 8:00 a.m. to 4:30 p.m. Monday - Friday. Please note that voicemails left after 4:00 p.m. may not be returned until the following business day.  We are closed weekends and major holidays. You have access to a nurse at all times for urgent questions. Please call the main number to the clinic Dept: [phone] and follow the prompts.   For any non-urgent questions, you may also contact your provider using MyChart. We now offer e-Visits for anyone 60 and older to request care online for non-urgent symptoms. For details visit [email or web address].   Also download the MyChart app! Go to the app store, search MyChart, open the app, select [facility], and log in with your MyChart username and password.  Rehydration, Older Adult  Rehydration is the replacement of fluids, salts, and minerals in the body (electrolytes) that are lost during dehydration. Dehydration is when there is not enough water or other fluids in the body. This happens when you lose more fluids than you take in. People who are age 5 or older have a higher risk of dehydration than younger adults. This is because in older age, the body: Is less able to maintain the right amount of water. Does not respond to temperature changes as well. Does not  get a sense of thirst as easily or quickly. Other causes include: Not drinking enough fluids. This can occur when you are ill, when you forget to drink, or when you are doing activities that require a lot of energy, especially in hot weather. Conditions that cause loss of water or other fluids. These include diarrhea, vomiting, sweating, or urinating a lot. Other illnesses,  such as fever or infection. Certain medicines, such as those that remove excess fluid from the body (diuretics). Symptoms of mild or moderate dehydration may include thirst, dry lips and mouth, and dizziness. Symptoms of severe dehydration may include increased heart rate, confusion, fainting, and not urinating. In severe cases, you may need to get fluids through an IV at the hospital. For mild or moderate cases, you can usually rehydrate at home by drinking certain fluids as told by your health care provider. What are the risks? Rehydration is usually safe. Taking in too much fluid (overhydration) can be a problem but is rare. Overhydration can cause an imbalance of electrolytes in the body, kidney failure, fluid in the lungs, or a decrease in salt (sodium) levels in the body. Supplies needed: You will need an oral rehydration solution (ORS) if your health care provider tells you to use one. This is a drink to treat dehydration. It can be found in pharmacies and retail stores. How to rehydrate Fluids Follow instructions from your health care provider about what to drink. The kind of fluid and the amount you should drink depend on your condition. In general, you should choose drinks that you prefer. If told by your health care provider, drink an ORS. Make an ORS by following instructions on the package. Start by drinking small amounts, about  cup (120 mL) every 5-10 minutes. Slowly increase how much you drink until you have taken in the amount recommended by your health care provider. Drink enough clear fluids to keep your urine pale yellow. If you were told to drink an ORS, finish it first, then start slowly drinking other clear fluids. Drink fluids such as: Water. This includes sparkling and flavored water. Drinking only water can lead to having too little sodium in your body (hyponatremia). Follow the advice of your health care provider. Water from ice chips you suck on. Fruit juice with water  added to it(diluted). Sports drinks. Hot or cold herbal teas. Broth-based soups. Coffee. Milk or milk products. Food Follow instructions from your health care provider about what to eat while you rehydrate. Your health care provider may recommend that you slowly begin eating regular foods in small amounts. Eat foods that contain a healthy balance of electrolytes, such as bananas, oranges, potatoes, tomatoes, and spinach. Avoid foods that are greasy or contain a lot of sugar. In some cases, you may get nutrition through a feeding tube that is passed through your nose and into your stomach (nasogastric tube, or NG tube). This may be done if you have uncontrolled vomiting or diarrhea. Drinks to avoid  Certain drinks may make dehydration worse. While you rehydrate, avoid drinking alcohol. How to tell if you are recovering from dehydration You may be getting better if: You are urinating more often than before you started rehydrating. Your urine is pale yellow. Your energy level improves. You vomit less often. You have diarrhea less often. Your appetite improves or returns to normal. You feel less dizzy or light-headed. Your skin tone and color start to look more normal. Follow these instructions at home: Take over-the-counter and prescription medicines only  as told by your health care provider. Do not take sodium tablets. Doing this can lead to having too much sodium in your body (hypernatremia). Contact a health care provider if: You continue to have symptoms of mild or moderate dehydration, such as: Thirst. Dry lips. Slightly dry mouth. Dizziness. Dark urine or less urine than usual. Muscle cramps. You continue to vomit or have diarrhea. Get help right away if: You have symptoms of dehydration that get worse. You have a fever. You have a severe headache. You have been vomiting and have problems, such as: Your vomiting gets worse. Your vomit includes blood or green matter  (bile). You cannot eat or drink without vomiting. You have problems with urination or bowel movements, such as: Diarrhea that gets worse. Blood in your stool (feces). This may cause stool to look black and tarry. Not urinating, or urinating only a small amount of very dark urine, within 6-8 hours. You have trouble breathing. You have symptoms that get worse with treatment. These symptoms may be an emergency. Get help right away. Call 911. Do not wait to see if the symptoms will go away. Do not drive yourself to the hospital. This information is not intended to replace advice given to you by your health care provider. Make sure you discuss any questions you have with your health care provider. Document Revised: 05/04/2021 Document Reviewed: 05/02/2021 Elsevier Patient Education  2024 [employer].

## 2023-07-05 ENCOUNTER — Inpatient Hospital Stay

## 2023-07-05 ENCOUNTER — Ambulatory Visit: Admitting: Podiatry

## 2023-07-05 ENCOUNTER — Other Ambulatory Visit: Payer: Self-pay | Admitting: *Deleted

## 2023-07-05 ENCOUNTER — Inpatient Hospital Stay (HOSPITAL_BASED_OUTPATIENT_CLINIC_OR_DEPARTMENT_OTHER): Admitting: Nurse Practitioner

## 2023-07-05 ENCOUNTER — Encounter: Payer: Self-pay | Admitting: Nurse Practitioner

## 2023-07-05 ENCOUNTER — Inpatient Hospital Stay: Admitting: Hematology and Oncology

## 2023-07-05 ENCOUNTER — Inpatient Hospital Stay: Attending: Hematology and Oncology

## 2023-07-05 VITALS — BP 119/87 | HR 102 | Temp 98.5°F | Resp 16 | Wt 140.5 lb

## 2023-07-05 DIAGNOSIS — Z5111 Encounter for antineoplastic chemotherapy: Secondary | ICD-10-CM | POA: Diagnosis present

## 2023-07-05 DIAGNOSIS — C3412 Malignant neoplasm of upper lobe, left bronchus or lung: Secondary | ICD-10-CM

## 2023-07-05 DIAGNOSIS — R630 Anorexia: Secondary | ICD-10-CM

## 2023-07-05 DIAGNOSIS — R634 Abnormal weight loss: Secondary | ICD-10-CM | POA: Diagnosis not present

## 2023-07-05 DIAGNOSIS — I10 Essential (primary) hypertension: Secondary | ICD-10-CM | POA: Diagnosis not present

## 2023-07-05 DIAGNOSIS — Z923 Personal history of irradiation: Secondary | ICD-10-CM | POA: Insufficient documentation

## 2023-07-05 DIAGNOSIS — C61 Malignant neoplasm of prostate: Secondary | ICD-10-CM | POA: Insufficient documentation

## 2023-07-05 DIAGNOSIS — D72829 Elevated white blood cell count, unspecified: Secondary | ICD-10-CM | POA: Diagnosis not present

## 2023-07-05 DIAGNOSIS — G4700 Insomnia, unspecified: Secondary | ICD-10-CM | POA: Diagnosis not present

## 2023-07-05 DIAGNOSIS — Z8673 Personal history of transient ischemic attack (TIA), and cerebral infarction without residual deficits: Secondary | ICD-10-CM | POA: Diagnosis not present

## 2023-07-05 DIAGNOSIS — D6481 Anemia due to antineoplastic chemotherapy: Secondary | ICD-10-CM | POA: Diagnosis not present

## 2023-07-05 DIAGNOSIS — G893 Neoplasm related pain (acute) (chronic): Secondary | ICD-10-CM | POA: Diagnosis not present

## 2023-07-05 DIAGNOSIS — M25551 Pain in right hip: Secondary | ICD-10-CM | POA: Diagnosis not present

## 2023-07-05 DIAGNOSIS — T451X5D Adverse effect of antineoplastic and immunosuppressive drugs, subsequent encounter: Secondary | ICD-10-CM | POA: Diagnosis not present

## 2023-07-05 DIAGNOSIS — D509 Iron deficiency anemia, unspecified: Secondary | ICD-10-CM | POA: Insufficient documentation

## 2023-07-05 DIAGNOSIS — G4709 Other insomnia: Secondary | ICD-10-CM

## 2023-07-05 DIAGNOSIS — C7951 Secondary malignant neoplasm of bone: Secondary | ICD-10-CM | POA: Insufficient documentation

## 2023-07-05 DIAGNOSIS — Z87891 Personal history of nicotine dependence: Secondary | ICD-10-CM | POA: Insufficient documentation

## 2023-07-05 DIAGNOSIS — D649 Anemia, unspecified: Secondary | ICD-10-CM

## 2023-07-05 DIAGNOSIS — E871 Hypo-osmolality and hyponatremia: Secondary | ICD-10-CM | POA: Insufficient documentation

## 2023-07-05 DIAGNOSIS — Z79899 Other long term (current) drug therapy: Secondary | ICD-10-CM | POA: Insufficient documentation

## 2023-07-05 DIAGNOSIS — Z515 Encounter for palliative care: Secondary | ICD-10-CM | POA: Insufficient documentation

## 2023-07-05 DIAGNOSIS — C3490 Malignant neoplasm of unspecified part of unspecified bronchus or lung: Secondary | ICD-10-CM

## 2023-07-05 DIAGNOSIS — Z95828 Presence of other vascular implants and grafts: Secondary | ICD-10-CM | POA: Diagnosis not present

## 2023-07-05 DIAGNOSIS — Z79891 Long term (current) use of opiate analgesic: Secondary | ICD-10-CM | POA: Diagnosis not present

## 2023-07-05 DIAGNOSIS — R530 Neoplastic (malignant) related fatigue: Secondary | ICD-10-CM

## 2023-07-05 LAB — CMP (CANCER CENTER ONLY)
ALT: 16 U/L (ref 0–44)
AST: 20 U/L (ref 15–41)
Albumin: 3 g/dL — ABNORMAL LOW (ref 3.5–5.0)
Alkaline Phosphatase: 56 U/L (ref 38–126)
Anion gap: 6 (ref 5–15)
BUN: 11 mg/dL (ref 8–23)
CO2: 26 mmol/L (ref 22–32)
Calcium: 9.1 mg/dL (ref 8.9–10.3)
Chloride: 102 mmol/L (ref 98–111)
Creatinine: 0.75 mg/dL (ref 0.61–1.24)
GFR, Estimated: >60 mL/min (ref >60)
Glucose, Bld: 89 mg/dL (ref 70–99)
Potassium: 3.9 mmol/L (ref 3.5–5.1)
Sodium: 134 mmol/L — ABNORMAL LOW (ref 135–145)
Total Bilirubin: 0.2 mg/dL (ref 0.0–1.2)
Total Protein: 6.5 g/dL (ref 6.5–8.1)

## 2023-07-05 LAB — CBC WITH DIFFERENTIAL (CANCER CENTER ONLY)
Abs Immature Granulocytes: 0.04 K/uL (ref 0.00–0.07)
Basophils Absolute: 0.1 K/uL (ref 0.0–0.1)
Basophils Relative: 1 %
Eosinophils Absolute: 0.4 K/uL (ref 0.0–0.5)
Eosinophils Relative: 8 %
HCT: 24 % — ABNORMAL LOW (ref 39.0–52.0)
Hemoglobin: 8 g/dL — ABNORMAL LOW (ref 13.0–17.0)
Immature Granulocytes: 1 %
Lymphocytes Relative: 16 %
Lymphs Abs: 0.9 K/uL (ref 0.7–4.0)
MCH: 30.4 pg (ref 26.0–34.0)
MCHC: 33.3 g/dL (ref 30.0–36.0)
MCV: 91.3 fL (ref 80.0–100.0)
Monocytes Absolute: 0.4 K/uL (ref 0.1–1.0)
Monocytes Relative: 7 %
Neutro Abs: 3.7 K/uL (ref 1.7–7.7)
Neutrophils Relative %: 67 %
Platelet Count: 286 K/uL (ref 150–400)
RBC: 2.63 MIL/uL — ABNORMAL LOW (ref 4.22–5.81)
RDW: 20.1 % — ABNORMAL HIGH (ref 11.5–15.5)
WBC Count: 5.5 K/uL (ref 4.0–10.5)
nRBC: 0.4 % — ABNORMAL HIGH (ref 0.0–0.2)

## 2023-07-05 LAB — SAMPLE TO BLOOD BANK

## 2023-07-05 MED ORDER — HEPARIN SOD (PORK) LOCK FLUSH 100 UNIT/ML IV SOLN
500.0000 [IU] | Freq: Once | INTRAVENOUS | Status: AC | PRN
  Administered 2023-07-05: 500 [IU]

## 2023-07-05 MED ORDER — PROCHLORPERAZINE MALEATE 10 MG PO TABS
10.0000 mg | ORAL_TABLET | Freq: Once | ORAL | Status: AC
  Administered 2023-07-05: 10 mg via ORAL
  Filled 2023-07-05: qty 1

## 2023-07-05 MED ORDER — SODIUM CHLORIDE 1 G PO TABS: 1.0000 g | ORAL_TABLET | Freq: Three times a day (TID) | ORAL | 3 refills | Status: DC

## 2023-07-05 MED ORDER — SODIUM CHLORIDE 0.9% FLUSH
10.0000 mL | INTRAVENOUS | Status: DC | PRN
Start: 2023-07-05 — End: 2023-07-05
  Administered 2023-07-05: 10 mL

## 2023-07-05 MED ORDER — SODIUM CHLORIDE 0.9 % IV SOLN: INTRAVENOUS | Status: DC

## 2023-07-05 MED ORDER — SODIUM CHLORIDE 0.9 % IV SOLN
1000.0000 mg/m2 | Freq: Once | INTRAVENOUS | Status: AC
  Administered 2023-07-05: 1824 mg via INTRAVENOUS
  Filled 2023-07-05: qty 47.97

## 2023-07-05 MED ORDER — SODIUM CHLORIDE 0.9% FLUSH
10.0000 mL | Freq: Once | INTRAVENOUS | Status: AC
  Administered 2023-07-05: 10 mL

## 2023-07-05 MED ORDER — MIRTAZAPINE 7.5 MG PO TABS: 7.5000 mg | ORAL_TABLET | Freq: Every day | ORAL | 0 refills | Status: DC

## 2023-07-05 NOTE — Progress Notes (Signed)
 Palliative Medicine [facility]  [phone]) [phone] Fax:([phone]) [phone]   Name: Terry Page. Date: 07/05/2023 MRN: [id number]  DOB: [birth year]  Patient Care Team: Rolinda Millman, MD as PCP - General (Family Medicine)    INTERVAL HISTORY: Terry Agostinelli. is a 68 y.o. Page with medical history including lung adenocarcinoma s/p SBRT with bone lesions, prostate cancer s/p curative radiation, neoplasm related pain, hypertension, history of tobacco and alcohol use. He is currently undergoing chemotherapy. Palliative ask to see for symptom management.   SOCIAL HISTORY:     reports that he quit smoking about 3 years ago. His smoking use included cigarettes. He has never used smokeless tobacco. He reports current alcohol use of about 12.0 standard drinks of alcohol per week. He reports that he does not use drugs.  ADVANCE DIRECTIVES:  None on file   CODE STATUS: Full code  PAST MEDICAL HISTORY: Past Medical History:  Diagnosis Date   Alcohol abuse    Asthma    Bullous emphysema ([name])    Essential hypertension 04/10/2020   Hemorrhoids    History of radiation therapy 04/08/20-04/19/20   IMRT- Left lung- Dr. Lynwood Nasuti   Incidental lung nodule, greater than or equal to 8mm 10/22/2017   Left upper lobe - discovered on CTA   Prostate cancer ([name])    Spontaneous pneumothorax 10/20/2017   right   Tobacco abuse     ALLERGIES:  is allergic to morphine  and oxycodone .  MEDICATIONS:  Current Outpatient Medications  Medication Sig Dispense Refill   mirtazapine (REMERON) 7.5 MG tablet Take 1 tablet (7.5 mg total) by mouth at bedtime. 30 tablet 0   acetaminophen  (TYLENOL ) 325 MG tablet Take 2 tablets (650 mg total) by mouth every 6 (six) hours as needed for mild pain or headache (fever >/= 101).     albuterol  (VENTOLIN  HFA) 108 (90 Base) MCG/ACT inhaler Inhale 2 puffs into the lungs every 6 (six) hours as needed for wheezing or shortness of breath.      [Paused] amLODipine  (NORVASC ) 10 MG tablet Take 1 tablet by mouth daily.     aspirin  EC 325 MG tablet Take 1 tablet (325 mg total) by mouth daily. 30 tablet 1   atorvastatin  (LIPITOR) 40 MG tablet Take 1 tablet (40 mg total) by mouth daily. 30 tablet 1   cyanocobalamin  (VITAMIN B12) 1000 MCG tablet Take 1 tablet (1,000 mcg total) by mouth daily. (Patient taking differently: Take 1,000 mcg by mouth as needed (When directed by physican).) 90 tablet 1   dronabinol  (MARINOL ) 5 MG capsule Take 1 capsule (5 mg total) by mouth 2 (two) times daily before lunch and supper. 60 capsule 0   ferrous sulfate  325 (65 FE) MG EC tablet Take 1 tablet (325 mg total) by mouth daily with breakfast. 30 tablet 3   folic acid  (FOLVITE ) 1 MG tablet Take 1 tablet (1 mg total) by mouth daily. 90 tablet 3   hydrOXYzine  (ATARAX ) 25 MG tablet Take 1 tablet (25 mg total) by mouth at bedtime as needed for itching. 30 tablet 11   ipratropium-albuterol  (DUONEB) 0.5-2.5 (3) MG/3ML SOLN Take 3 mLs by nebulization every 6 (six) hours as needed. (Patient taking differently: Take 3 mLs by nebulization every 6 (six) hours as needed (shortness of breathing or wheezing).) 360 mL 0   lidocaine  (LIDODERM ) 5 % Place 1 patch onto the skin daily. Remove & Discard patch within 12 hours or as directed by MD. Apply to low  back 30 patch 0   lidocaine -prilocaine  (EMLA ) cream Apply 1 Application topically as needed. 30 g 0   [Paused] olmesartan (BENICAR) 40 MG tablet Take 40 mg by mouth daily.     polyethylene glycol (MIRALAX ) 17 g packet Take 17 g by mouth daily. 30 each 2   potassium chloride  SA (KLOR-CON  M) 20 MEQ tablet TAKE 1 TABLET(20 MEQ) BY MOUTH DAILY 90 tablet 0   SENNA S 8.6-50 MG tablet Take 2 tablets by mouth at bedtime as needed for mild constipation or moderate constipation.     senna-docusate (STIMULANT LAXATIVE) 8.6-50 MG tablet Take 2 tablets by mouth 2 (two) times daily. May add additional tablet as needed 150 tablet 3   sodium  chloride 1 g tablet Take 1 tablet (1 g total) by mouth 3 (three) times daily with meals. 90 tablet 3   thiamine  100 MG tablet Take 1 tablet (100 mg total) by mouth daily.     traMADol  (ULTRAM ) 50 MG tablet Take 1-2 tablets (50-100 mg total) by mouth every 6 (six) hours as needed. 120 tablet 0   No current facility-administered medications for this visit.   Facility-Administered Medications Ordered in Other Visits  Medication Dose Route Frequency Provider Last Rate Last Admin   0.9 %  sodium chloride  infusion   Intravenous Continuous Federico Norleen DASEN IV, MD       0.9 %  sodium chloride  infusion   Intravenous Continuous Dorsey, John T IV, MD   Stopped at 07/05/23 1120   sodium chloride  flush (NS) 0.9 % injection 10 mL  10 mL Intracatheter PRN Federico Norleen DASEN IV, MD   10 mL at 07/05/23 1118    VITAL SIGNS: There were no vitals taken for this visit. There were no vitals filed for this visit.   Estimated body mass index is 20.75 kg/m as calculated from the following:   Height as of 05/11/23: 5' 9 (1.753 m).   Weight as of an earlier encounter on 07/05/23: 140 lb 8 oz (63.7 kg).   PERFORMANCE STATUS (ECOG) : 1 - Symptomatic but completely ambulatory  IMPRESSION: Discussed the use of AI scribe software for clinical note transcription with the patient, who gave verbal consent to proceed.  History of Present Illness Terry Gillian. is a 68 year old Page  with metastatic lung cancer with bone involvement who presented to clinic for symptom management follow-up. No acute distress. States he is doing well overall. Denies nausea, vomiting, constipation, or diarrhea. Occasional fatigue however remains as active as possible.   Patient reports ongoing insomnia with occasional vivid dreams. He shares hopes for a restful full night's sleep. Appetite remains minimal. Some days are better than others. His current weight is 140lbs up some from 138lbs. Marinol  remains on [place]. Will refrain  from use of Megace due to history of CVA and clots. Education provided on use of mirtazapine with added benefit of offering sleep aid support. We discussed administration, efficacy, and potential side effects. Patient verbalized understanding.   Mr. Milledge reports his pain is well-controlled with use of tramadol  50-100 mg as needed in addition to lidocaine  patches.  No adjustments to current regimen.  I discussed the importance of continued conversation with family and their medical providers regarding overall plan of care and treatment options, ensuring decisions are within the context of the patients values and GOCs. Assessment & Plan Cancer related pain/Back pain Reports pain is well controlled. No adjustments at this time.  - Tramadol  50-100mg  every  6 hours as needed -Lidocaine  patch to back as needed.  Insomnia Patient having insomnia sleeping only several hours per night. Often waking up and unable to fall back to sleep. -Mirtazapine 7.5mg  at bedtime  Decreased appetite Marinol  remains on backorder. Contradicted with Megace use -Mirtazapine 7.5mg  at bedtime.   I will plan to see patient back in 3-4 weeks. Sooner if needed.   Patient expressed understanding and was in agreement with this plan. He also understands that He can call the clinic at any time with any questions, concerns, or complaints.   Any controlled substances utilized were prescribed in the context of palliative care. PDMP has been reviewed.   Visit consisted of counseling and education dealing with the complex and emotionally intense issues of symptom management and palliative care in the setting of serious and potentially life-threatening illness.  Levon Borer, AGPCNP-BC  Palliative Medicine Team/[facility]

## 2023-07-05 NOTE — Progress Notes (Signed)
 [facility] [phone]   [phone]  PROGRESS NOTE  Patient Care Team: Rolinda Millman, MD as PCP - General (Family Medicine)  Hematological/Oncological History # Metastatic Adenocarcinoma of the Lung  01/07/2020 : CT of the chest with contrast performed which showed interval enlargement of a spiculated nodule in the central left upper lobe measuring 1.2 x 1.0 cm and highly suspicious for primary lung malignancy 02/06/2020 :PET scan performed which showed a hypermetabolic irregular solid 1.3 cm left upper lobe pulmonary nodule compatible with malignancy without any other hypermetabolic lesions 05/04/7975- 04/19/2020:  SBRT to the lung lesion 04/28/2021: CT C/A/P showed multifocal lytic bone metastases are identified. Lesion within the spine of the left scapula and lesions involving bilateral iliac bones and left sacral wing. Establish care with Dr. Federico  05/03/2021: biopsy of right iliac lytic lesion showed metastatic carcinoma, consistent with lung primary.  07/08/2021: Cycle 1 Day 1 of Carbo/Pem/Pem 07/29/2021: Cycle 2 Day 1 of Carbo/Pem/Pem 08/11/2021-08/24/2021: Received palliative radiation to osseous metastasis in the left shoulder. 30 Gy in 10 fractions.  08/19/2021: Cycle 3 Day 1 of Carbo/Pem/Pem 09/09/2021: Cycle 4 Day 1 of Carbo/Pem/Pem 09/23/2021: CT CAP: stable disease 09/30/2021: Cycle 5 Day 1 of Pem/Pem 10/21/2021: Cycle 6 Day 1 of Pembrolizumab . Held pemetrexed  due to anemia. 11/16/2021: Cycle 7 Day 1 of Pem/Pem.   12/16/2021: Cycle 8 Day 1 of Pem/Pem.   01/06/2022: Cycle 9 Day 1 of Pem/Pem.  01/27/2022: Cycle 10 Day 1 of Pem/Pem 02/17/2022: Cycle 11 Day 1 of Pem/Pem 03/10/2022: Cycle 12 Day 1 of Pem/Pem (HELD due to anemia with Hgb of 6.4) 03/17/2022: Cycle 12 Day 1 of Pem/Pem (HELD pemextrexed due to cytopenias/kidney function) 04/10/2022: Cycle 13 Day 1 of Pem/Pem (HELD pemextrexed due to cytopenias/kidney function) 04/28/2022: Cycle 14 Day 1 of Pem/Pem  (HELD pemextrexed due to cytopenias/kidney function) 05/23/2022:  Cycle 15 Day 1 of Pem/Pem (HELD pemextrexed due to cytopenias/kidney function) 06/15/2022: Cycle 16 Day 1 of Pembrolizumab .  07/07/2022: Cycle 17 Day 1 of Pembrolizumab .  07/28/2022: Cycle 18 Day 1 of Pembrolizumab  08/18/2022: Cycle 19 Day 1 of Pembrolizumab  HELD due to hyponatremia of 118, new bone metastases. 08/25/2022: Cycle 19 Day 1 of Pembrolizumab  08/28/2022-09/13/2022: Received palliative radiation to right pelvis with total dose of 30 Gy in 10 Fx.  09/22/2022:  Cycle 20 Day 1 of Pembrolizumab  10/13/2022:  Cycle 21 Day 1 of Pembrolizumab  12/11/2022: Cycle 1 Day 1 of Docetaxel /Ramucircumab.  01/05/2023: Cycle 2 Day 1 of Docetaxel /Ramucircumab.  01/26/2023: Cycle 3 Day 1 of Docetaxel /Ramucircumab 02/16/2023:  Cycle 4 Day 1 of Docetaxel /Ramucircumab 03/09/2023: Cycle 5 Day 1 of Docetaxel /Ramucircumab 03/30/2023:  Cycle 6 Day 1 of Docetaxel /Ramucircumab 04/20/2023-04/23/2023: Admitted for slurred speech and weakness. MRI brain concerning for stroke. CTA chest found progression of irregular consolidation in the left apex.  04/27/2023: Cycle 1, Day 1 of Gemcitabine  05/25/2023: Cycle 2, Day 1 of Gemcitabine  06/22/2023: Cycle 3, Day 1 of Gemcitabine   #Adenocarcinoma of the Prostate, T1cN0M0 08/19/2019-10/07/2019: 70 Gy in 28 fractions of 2.5 Gy.  Radiation to the prostate was under the care of Dr. Donnice Barge  Interval History:  Terry Page. 68 y.o. male with medical history significant for metastatic adenocarcinoma of the lung who presents for a follow up visit. The patient's last visit was on 06/22/2023. In the interim since the last visit, he has continued on single agent gemcitabine .  On exam today, Terry Page reports he is looking forward to his birthday this weekend.  He reports he has nothing  scheduled but his family may have a celebration for him.  He reports his energy levels are good.  He is not having any lightheadedness,  dizziness, or shortness of breath.  He reports his appetite is strong.  His weight has been stable.  He has had no trouble with nausea, vomiting, or diarrhea.  He does have some occasional fatigue the day after his treatment but overall feels well.  He is doing his best to stay well-hydrated.  He is not having any major side effects as a result of his chemotherapy treatment.  Full 10 point ROS is otherwise negative.   MEDICAL HISTORY:  Past Medical History:  Diagnosis Date   Alcohol abuse    Asthma    Bullous emphysema ([name])    Essential hypertension 04/10/2020   Hemorrhoids    History of radiation therapy 04/08/20-04/19/20   IMRT- Left lung- Dr. Lynwood Nasuti   Incidental lung nodule, greater than or equal to 8mm 10/22/2017   Left upper lobe - discovered on CTA   Prostate cancer ([name])    Spontaneous pneumothorax 10/20/2017   right   Tobacco abuse     SURGICAL HISTORY: Past Surgical History:  Procedure Laterality Date   BACK SURGERY     IR IMAGING GUIDED PORT INSERTION  06/29/2021   PROSTATE BIOPSY      SOCIAL HISTORY: Social History   Socioeconomic History   Marital status: Single    Spouse name: Not on file   Number of children: Not on file   Years of education: Not on file   Highest education level: Not on file  Occupational History   Occupation: retired  Tobacco Use   Smoking status: Former    Current packs/day: 0.00    Types: Cigarettes    Quit date: 11/22/2019    Years since quitting: 3.6   Smokeless tobacco: Never   Tobacco comments:    Patient reports quit 3 years ago. 06/25/20. [facility]  Vaping Use   Vaping status: Never Used  Substance and Sexual Activity   Alcohol use: Yes    Alcohol/week: 12.0 standard drinks of alcohol    Types: 12 Cans of beer per week    Comment: daily sometimes   Drug use: No   Sexual activity: Not Currently  Other Topics Concern   Not on file  Social History Narrative   Not on file   Social Drivers of Health   Financial Resource  Strain: Not on file  Food Insecurity: No Food Insecurity (04/22/2023)   Hunger Vital Sign    Worried About Running Out of Food in the Last Year: Never true    Ran Out of Food in the Last Year: Never true  Transportation Needs: No Transportation Needs (04/22/2023)   PRAPARE - Administrator, Civil Service (Medical): No    Lack of Transportation (Non-Medical): No  Recent Concern: Transportation Needs - Unmet Transportation Needs (04/21/2023)   PRAPARE - Administrator, Civil Service (Medical): Yes    Lack of Transportation (Non-Medical): No  Physical Activity: Not on file  Stress: Not on file  Social Connections: Moderately Integrated (04/21/2023)   Social Connection and Isolation Panel    Frequency of Communication with Friends and Family: More than three times a week    Frequency of Social Gatherings with Friends and Family: More than three times a week    Attends Religious Services: 1 to 4 times per year    Active Member of Clubs or Organizations: No  Attends Banker Meetings: Never    Marital Status: Married  Catering manager Violence: Not At Risk (04/22/2023)   Humiliation, Afraid, Rape, and Kick questionnaire    Fear of Current or Ex-Partner: No    Emotionally Abused: No    Physically Abused: No    Sexually Abused: No    FAMILY HISTORY: Family History  Problem Relation Age of Onset   Cancer Cousin        maternal cousin   Cancer Cousin        paternal cousin   Cancer Cousin    Colon polyps Neg Hx    Pancreatic disease Neg Hx    Pancreatic cancer Neg Hx    Breast cancer Neg Hx    Colon cancer Neg Hx     ALLERGIES:  is allergic to morphine  and oxycodone .  MEDICATIONS:  Current Outpatient Medications  Medication Sig Dispense Refill   acetaminophen  (TYLENOL ) 325 MG tablet Take 2 tablets (650 mg total) by mouth every 6 (six) hours as needed for mild pain or headache (fever >/= 101).     albuterol  (VENTOLIN  HFA) 108 (90 Base) MCG/ACT  inhaler Inhale 2 puffs into the lungs every 6 (six) hours as needed for wheezing or shortness of breath.     [Paused] amLODipine  (NORVASC ) 10 MG tablet Take 1 tablet by mouth daily.     aspirin  EC 325 MG tablet Take 1 tablet (325 mg total) by mouth daily. 30 tablet 1   atorvastatin  (LIPITOR) 40 MG tablet Take 1 tablet (40 mg total) by mouth daily. 30 tablet 1   cyanocobalamin  (VITAMIN B12) 1000 MCG tablet Take 1 tablet (1,000 mcg total) by mouth daily. (Patient taking differently: Take 1,000 mcg by mouth as needed (When directed by physican).) 90 tablet 1   dronabinol  (MARINOL ) 5 MG capsule Take 1 capsule (5 mg total) by mouth 2 (two) times daily before lunch and supper. 60 capsule 0   ferrous sulfate  325 (65 FE) MG EC tablet Take 1 tablet (325 mg total) by mouth daily with breakfast. 30 tablet 3   folic acid  (FOLVITE ) 1 MG tablet Take 1 tablet (1 mg total) by mouth daily. 90 tablet 3   hydrOXYzine  (ATARAX ) 25 MG tablet Take 1 tablet (25 mg total) by mouth at bedtime as needed for itching. 30 tablet 11   ipratropium-albuterol  (DUONEB) 0.5-2.5 (3) MG/3ML SOLN Take 3 mLs by nebulization every 6 (six) hours as needed. (Patient taking differently: Take 3 mLs by nebulization every 6 (six) hours as needed (shortness of breathing or wheezing).) 360 mL 0   lidocaine  (LIDODERM ) 5 % Place 1 patch onto the skin daily. Remove & Discard patch within 12 hours or as directed by MD. Apply to low back 30 patch 0   lidocaine -prilocaine  (EMLA ) cream Apply 1 Application topically as needed. 30 g 0   [Paused] olmesartan (BENICAR) 40 MG tablet Take 40 mg by mouth daily.     polyethylene glycol (MIRALAX ) 17 g packet Take 17 g by mouth daily. 30 each 2   potassium chloride  SA (KLOR-CON  M) 20 MEQ tablet TAKE 1 TABLET(20 MEQ) BY MOUTH DAILY 90 tablet 0   SENNA S 8.6-50 MG tablet Take 2 tablets by mouth at bedtime as needed for mild constipation or moderate constipation.     senna-docusate (STIMULANT LAXATIVE) 8.6-50 MG tablet  Take 2 tablets by mouth 2 (two) times daily. May add additional tablet as needed 150 tablet 3   sodium chloride  1 g tablet Take 1  tablet (1 g total) by mouth 3 (three) times daily with meals. 90 tablet 3   thiamine  100 MG tablet Take 1 tablet (100 mg total) by mouth daily.     traMADol  (ULTRAM ) 50 MG tablet Take 1-2 tablets (50-100 mg total) by mouth every 6 (six) hours as needed. 120 tablet 0   No current facility-administered medications for this visit.    REVIEW OF SYSTEMS:   Constitutional: ( - ) fevers, ( - )  chills , ( - ) night sweats Eyes: ( - ) blurriness of vision, ( - ) double vision, ( - ) watery eyes Ears, nose, mouth, throat, and face: ( - ) mucositis, ( - ) sore throat Respiratory: ( - ) cough, ( - ) dyspnea, ( - ) wheezes Cardiovascular: ( - ) palpitation, ( - ) chest discomfort, ( - ) lower extremity swelling Gastrointestinal:  ( - ) nausea, ( - ) heartburn, ( - ) change in bowel habits Skin: ( - ) abnormal skin rashes Lymphatics: ( - ) new lymphadenopathy, ( - ) easy bruising Neurological: ( - ) numbness, ( - ) tingling, ( - ) new weaknesses Behavioral/Psych: ( - ) mood change, ( - ) new changes  All other systems were reviewed with the patient and are negative.  PHYSICAL EXAMINATION: ECOG PERFORMANCE STATUS: 1 - Symptomatic but completely ambulatory  Vitals:   07/05/23 0906  BP: 119/87  Pulse: (!) 102  Resp: 16  Temp: 98.5 F (36.9 C)  SpO2: 98%         Filed Weights   07/05/23 0906  Weight: 140 lb 8 oz (63.7 kg)       GENERAL: Well-appearing elderly African-American male, alert, no distress and comfortable SKIN: skin color, texture, turgor are normal, no rashes or significant lesions EYES: conjunctiva are pink and non-injected, sclera clear LUNGS: clear to auscultation and percussion with normal breathing effort HEART: regular rate & rhythm and no murmurs and no lower extremity edema Musculoskeletal: no cyanosis of digits and no clubbing   PSYCH: alert & oriented x 3, fluent speech NEURO: no focal motor/sensory deficits  LABORATORY DATA:  I have reviewed the data as listed    Latest Ref Rng & Units 07/05/2023    8:51 AM 06/29/2023    8:55 AM 06/22/2023    8:21 AM  CBC  WBC 4.0 - 10.5 K/uL 5.5  6.1  12.8   Hemoglobin 13.0 - 17.0 g/dL 8.0  8.6  9.1   Hematocrit 39.0 - 52.0 % 24.0  26.5  27.5   Platelets 150 - 400 K/uL 286  577  601        Latest Ref Rng & Units 06/29/2023    8:55 AM 06/22/2023    8:21 AM 06/08/2023    8:19 AM  CMP  Glucose 70 - 99 mg/dL 98  84  95   BUN 8 - 23 mg/dL 8  10  9    Creatinine 0.61 - 1.24 mg/dL 9.17  9.11  9.15   Sodium 135 - 145 mmol/L 137  135  136   Potassium 3.5 - 5.1 mmol/L 4.0  4.0  3.7   Chloride 98 - 111 mmol/L 104  100  100   CO2 22 - 32 mmol/L 25  27  28    Calcium  8.9 - 10.3 mg/dL 9.3  9.2  9.2   Total Protein 6.5 - 8.1 g/dL 6.9  6.7  6.6   Total Bilirubin 0.0 - 1.2 mg/dL 0.2  0.2  0.2   Alkaline Phos 38 - 126 U/L 71  78  72   AST 15 - 41 U/L 20  15  16    ALT 0 - 44 U/L 13  8  12      Lab Results  Component Value Date   MPROTEIN Not Observed 04/28/2021   Lab Results  Component Value Date   KPAFRELGTCHN 33.6 (H) 04/28/2021   LAMBDASER 22.0 04/28/2021   KAPLAMBRATIO 1.53 04/28/2021    RADIOGRAPHIC STUDIES: No results found.    ASSESSMENT & PLAN Terry Page. Is a 68 y.o.  male with medical history significant for metastatic adenocarcinoma of the lung who presents for a follow up visit.  # Metastatic Adenocarcinoma of the Lung  -- NGS testing from this patient shows no evidence of targetable mutation. --MRI brain shows no evidence of intracranial metastases --Started carboplatin , pembrolizumab , and pemetrexed  as treatment for his cancer on 07/08/2021. --Received palliative radiation to osseous metastasis of the left shoulder from 08/11/2021-08/24/2021. Planned dose is 30 Gy in 10 fx.  --Discontinued Pemetrexed  on 03/17/2022 due to persistent anemia and kidney  dysfunction. --Restaging CT CAP from 08/09/2022 shows interval enlargement of multiple mixed lytic and sclerotic bone metastases involving the right pelvis. Other bone metastases including of the left scapula, sacrum, and left hemipelvis are unchanged. Unchanged treated central LUL mass. Recommend to continue on pembrolizumab  therapy and arrange for palliative radiation to enlarging bone metastases.  --CTA chest from 04/20/2023 showed progression of disease to the consolidation in the left apex. Recommended to change therapy  to single agent Gemcitabine  3 weeks on, 1 week off.  --Started Cycle 1, Day 1 of Gemcitabine  on 04/27/2023 Plan: --Due for Cycle 3, Day 15 of Gemcitabine  today.  --labs from today were reviewed. Labs show white blood cell count 5.5, hemoglobin 8.0, MCV 91.3, platelets 286. Creatinine and LFTs normal.  --No prohibitive toxicities identified so proceed with treatment without any dose modification --RTC for labs and visit prior to Cycle 4, Day 1  #Leukocytosis-neutrophil predominant: --Patient denies any infectious symptoms, elevation is likely secondary to growth factor --Held GCSF injection with Cycle 4 of Ram/Docetaxel . Continue to hold injection for future cycles.   #Left lower eyelid stye--resolved: --Applying warm compresses, recommend to continue --Completed 7 day course of doxycycline  100 mg BID.   #Left sided low back pain with sciatica: --History of lumbar spondylosis and moderate to severe facet arthrosis without spinal stenosis and mild right neural foraminal stenosis at L4-5 seen on MRI lumbar 04/24/2020.   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  --Pain is slightly improved but still present.  --Continue to take tramadol  q 6 hours for pain control.    #Hyponatremia-improved --Chronic in nature --Sodium level dropped from 127 on 08/09/2022 to 118.  Today sodium is stable  --Etiologies including SIADH, immunotherapy induced, too much water intake.  --Patient is asymptomatic without nausea/vomiting, headaches, confusion, seizures, etc --Currently on salt tablets 1gm three times daily.  --Strict ED precautions given for any new symptoms as discussed above.   # Iron Deficiency Anemia #Chemotherapy induced anemia --Currently on ferrous sulfate  325 mg PO daily --No evidence of iron/b12/folate deficiency per labs from 04/22/2023.   #Pain Control --stopped oxycodone  due to itching --currently following with Palliative care.  --pain well controlled with tramadol  as needed. Continue to take tramdol 50-100 mg  q 6 hours as needed. --Continue senna docusate and miralax  for constipation prophylaxis --Continue to monitor  #Supportive Care -- chemotherapy education complete -- port placed -- zofran  8mg  q8H PRN and compazine  10mg  PO q6H for nausea -- EMLA  cream for port -- Patient referred to palliative care for pain control. Pain control as above.    No orders of the defined types were placed in this encounter.   All questions were answered. The patient knows to call the clinic with any problems, questions or concerns.  I have spent a total of 30 minutes minutes of face-to-face and non-face-to-face time, preparing to see the patient, performing a medically appropriate examination, counseling and educating the patient, referring and communicating with other health care professionals, documenting clinical information in the electronic health record, and care coordination.   Norleen IVAR Kidney, MD Department of Hematology/Oncology [facility] at [facility] Phone: [phone] Pager: [phone] Email: [email or web address]  07/05/2023 9:25 AM

## 2023-07-05 NOTE — Patient Instructions (Signed)
 [facility] - A DEPT OF [facility]  Discharge Instructions: Thank you for choosing [facility] to provide your oncology and hematology care.   If you have a lab appointment with the Cancer Center, please go directly to the Cancer Center and check in at the registration area.   Wear comfortable clothing and clothing appropriate for easy access to any Portacath or PICC line.   We strive to give you quality time with your provider. You may need to reschedule your appointment if you arrive late (15 or more minutes).  Arriving late affects you and other patients whose appointments are after yours.  Also, if you miss three or more appointments without notifying the office, you may be dismissed from the clinic at the provider's discretion.      For prescription refill requests, have your pharmacy contact our office and allow 72 hours for refills to be completed.    Today you received the following chemotherapy and/or immunotherapy agents: Gemcitabine.       To help prevent nausea and vomiting after your treatment, we encourage you to take your nausea medication as directed.  BELOW ARE SYMPTOMS THAT SHOULD BE REPORTED IMMEDIATELY: *FEVER GREATER THAN 100.4 F (38 C) OR HIGHER *CHILLS OR SWEATING *NAUSEA AND VOMITING THAT IS NOT CONTROLLED WITH YOUR NAUSEA MEDICATION *UNUSUAL SHORTNESS OF BREATH *UNUSUAL BRUISING OR BLEEDING *URINARY PROBLEMS (pain or burning when urinating, or frequent urination) *BOWEL PROBLEMS (unusual diarrhea, constipation, pain near the anus) TENDERNESS IN MOUTH AND THROAT WITH OR WITHOUT PRESENCE OF ULCERS (sore throat, sores in mouth, or a toothache) UNUSUAL RASH, SWELLING OR PAIN  UNUSUAL VAGINAL DISCHARGE OR ITCHING   Items with * indicate a potential emergency and should be followed up as soon as possible or go to the Emergency Department if any problems should occur.  Please show the CHEMOTHERAPY ALERT CARD or  IMMUNOTHERAPY ALERT CARD at check-in to the Emergency Department and triage nurse.  Should you have questions after your visit or need to cancel or reschedule your appointment, please contact [facility] - A DEPT [facility]  Dept: [phone]  and follow the prompts.  Office hours are 8:00 a.m. to 4:30 p.m. Monday - Friday. Please note that voicemails left after 4:00 p.m. may not be returned until the following business day.  We are closed weekends and major holidays. You have access to a nurse at all times for urgent questions. Please call the main number to the clinic Dept: [phone] and follow the prompts.   For any non-urgent questions, you may also contact your provider using MyChart. We now offer e-Visits for anyone 19 and older to request care online for non-urgent symptoms. For details visit [email or web address].   Also download the MyChart app! Go to the app store, search "MyChart", open the app, select [facility], and log in with your MyChart username and password.

## 2023-07-09 ENCOUNTER — Other Ambulatory Visit: Payer: Self-pay

## 2023-07-09 DIAGNOSIS — C3490 Malignant neoplasm of unspecified part of unspecified bronchus or lung: Secondary | ICD-10-CM

## 2023-07-09 DIAGNOSIS — Z515 Encounter for palliative care: Secondary | ICD-10-CM

## 2023-07-09 DIAGNOSIS — G893 Neoplasm related pain (acute) (chronic): Secondary | ICD-10-CM

## 2023-07-09 MED ORDER — TRAMADOL HCL 50 MG PO TABS: 50.0000 mg | ORAL_TABLET | Freq: Four times a day (QID) | ORAL | 0 refills | Status: DC | PRN

## 2023-07-09 NOTE — Telephone Encounter (Signed)
 Pt called for medication refill, see associated orders

## 2023-07-12 ENCOUNTER — Other Ambulatory Visit: Payer: Self-pay

## 2023-07-12 MED ORDER — POTASSIUM CHLORIDE CRYS ER 20 MEQ PO TBCR: EXTENDED_RELEASE_TABLET | ORAL | 0 refills | Status: DC

## 2023-07-12 MED ORDER — SENNOSIDES-DOCUSATE SODIUM 8.6-50 MG PO TABS: 2.0000 | ORAL_TABLET | Freq: Two times a day (BID) | ORAL | 3 refills | Status: DC

## 2023-07-12 NOTE — Telephone Encounter (Signed)
 Pt called for medication refill, see associated orers. Confirmed potassium continuation with Norleen Kidney, MD

## 2023-07-16 ENCOUNTER — Ambulatory Visit (HOSPITAL_COMMUNITY)
Admission: RE | Admit: 2023-07-16 | Discharge: 2023-07-16 | Disposition: A | Discharge status: Home / Self Care | Source: Ambulatory Visit | Attending: Hematology and Oncology | Admitting: Hematology and Oncology

## 2023-07-16 ENCOUNTER — Telehealth: Payer: Self-pay | Admitting: Nurse Practitioner

## 2023-07-16 DIAGNOSIS — C3412 Malignant neoplasm of upper lobe, left bronchus or lung: Secondary | ICD-10-CM | POA: Diagnosis present

## 2023-07-16 MED ORDER — HEPARIN SOD (PORK) LOCK FLUSH 100 UNIT/ML IV SOLN
500.0000 [IU] | Freq: Once | INTRAVENOUS | Status: AC
  Administered 2023-07-16: 500 [IU] via INTRAVENOUS

## 2023-07-16 MED ORDER — IOHEXOL 300 MG/ML  SOLN
100.0000 mL | Freq: Once | INTRAMUSCULAR | Status: AC | PRN
  Administered 2023-07-16: 100 mL via INTRAVENOUS

## 2023-07-16 MED ORDER — SODIUM CHLORIDE (PF) 0.9 % IJ SOLN
INTRAMUSCULAR | Status: AC
  Filled 2023-07-16: qty 50

## 2023-07-16 NOTE — Telephone Encounter (Signed)
 Scheduled appointment with the patient per staff message.

## 2023-07-18 ENCOUNTER — Encounter: Payer: Self-pay | Admitting: Podiatry

## 2023-07-18 ENCOUNTER — Ambulatory Visit: Admitting: Podiatry

## 2023-07-18 DIAGNOSIS — M79674 Pain in right toe(s): Secondary | ICD-10-CM

## 2023-07-18 DIAGNOSIS — M79675 Pain in left toe(s): Secondary | ICD-10-CM

## 2023-07-18 DIAGNOSIS — B351 Tinea unguium: Secondary | ICD-10-CM | POA: Diagnosis not present

## 2023-07-18 NOTE — Progress Notes (Signed)
 This patient returns to my office for at risk foot care.  This patient requires this care by a professional since this patient will be at risk due to having kidney disease and his is taking chemo.  This patient is unable to cut nails himself since the patient cannot reach his nails.These nails are painful walking and wearing shoes.  This patient presents for at risk foot care today.  General Appearance  Alert, conversant and in no acute stress.  Vascular  Dorsalis pedis and posterior tibial  pulses are palpable  left foot.  Absent pulses right foot.  Capillary return is within normal limits  bilaterally. Temperature is within normal limits  bilaterally.  Neurologic  Senn-Weinstein monofilament wire test within normal limits  bilaterally. Muscle power within normal limits bilaterally.  Nails Thick disfigured discolored nails with subungual debris  from hallux to fifth toes bilaterally. No evidence of bacterial infection or drainage bilaterally.  Orthopedic  No limitations of motion  feet .  No crepitus or effusions noted.  HAV  B/L.  Skin  normotropic skin with no porokeratosis noted bilaterally.  No signs of infections or ulcers noted.     Onychomycosis  Pain in right toes  Pain in left toes   Mechanical debridement of nails 1-5  bilaterally performed with a nail nipper.  Filed with dremel without incident.    Return office visit   3 months for nail care.                   Told patient to return for periodic foot care and evaluation due to potential at risk complications.   Helane Gunther DPM

## 2023-07-20 ENCOUNTER — Other Ambulatory Visit: Payer: Self-pay | Admitting: *Deleted

## 2023-07-20 ENCOUNTER — Inpatient Hospital Stay: Admitting: Dietician

## 2023-07-20 ENCOUNTER — Inpatient Hospital Stay

## 2023-07-20 ENCOUNTER — Inpatient Hospital Stay: Admitting: Hematology and Oncology

## 2023-07-20 ENCOUNTER — Ambulatory Visit

## 2023-07-20 VITALS — BP 119/75 | HR 132 | Temp 98.3°F | Resp 17 | Wt 138.5 lb

## 2023-07-20 DIAGNOSIS — C3412 Malignant neoplasm of upper lobe, left bronchus or lung: Secondary | ICD-10-CM

## 2023-07-20 DIAGNOSIS — Z5111 Encounter for antineoplastic chemotherapy: Secondary | ICD-10-CM | POA: Diagnosis not present

## 2023-07-20 DIAGNOSIS — R530 Neoplastic (malignant) related fatigue: Secondary | ICD-10-CM

## 2023-07-20 DIAGNOSIS — Z95828 Presence of other vascular implants and grafts: Secondary | ICD-10-CM

## 2023-07-20 DIAGNOSIS — D649 Anemia, unspecified: Secondary | ICD-10-CM

## 2023-07-20 DIAGNOSIS — Z515 Encounter for palliative care: Secondary | ICD-10-CM

## 2023-07-20 LAB — CMP (CANCER CENTER ONLY)
ALT: 26 U/L (ref 0–44)
AST: 37 U/L (ref 15–41)
Albumin: 3 g/dL — ABNORMAL LOW (ref 3.5–5.0)
Alkaline Phosphatase: 74 U/L (ref 38–126)
Anion gap: 8 (ref 5–15)
BUN: 10 mg/dL (ref 8–23)
CO2: 27 mmol/L (ref 22–32)
Calcium: 8.9 mg/dL (ref 8.9–10.3)
Chloride: 96 mmol/L — ABNORMAL LOW (ref 98–111)
Creatinine: 0.9 mg/dL (ref 0.61–1.24)
GFR, Estimated: >60 mL/min (ref >60)
Glucose, Bld: 114 mg/dL — ABNORMAL HIGH (ref 70–99)
Potassium: 3.7 mmol/L (ref 3.5–5.1)
Sodium: 131 mmol/L — ABNORMAL LOW (ref 135–145)
Total Bilirubin: 0.3 mg/dL (ref 0.0–1.2)
Total Protein: 7.1 g/dL (ref 6.5–8.1)

## 2023-07-20 LAB — CBC WITH DIFFERENTIAL (CANCER CENTER ONLY)
Abs Immature Granulocytes: 0.16 K/uL — ABNORMAL HIGH (ref 0.00–0.07)
Basophils Absolute: 0.1 K/uL (ref 0.0–0.1)
Basophils Relative: 1 %
Eosinophils Absolute: 0.7 K/uL — ABNORMAL HIGH (ref 0.0–0.5)
Eosinophils Relative: 5 %
HCT: 26.8 % — ABNORMAL LOW (ref 39.0–52.0)
Hemoglobin: 8.7 g/dL — ABNORMAL LOW (ref 13.0–17.0)
Immature Granulocytes: 1 %
Lymphocytes Relative: 7 %
Lymphs Abs: 1 K/uL (ref 0.7–4.0)
MCH: 29.6 pg (ref 26.0–34.0)
MCHC: 32.5 g/dL (ref 30.0–36.0)
MCV: 91.2 fL (ref 80.0–100.0)
Monocytes Absolute: 3.1 K/uL — ABNORMAL HIGH (ref 0.1–1.0)
Monocytes Relative: 23 %
Neutro Abs: 8.4 K/uL — ABNORMAL HIGH (ref 1.7–7.7)
Neutrophils Relative %: 63 %
Platelet Count: 767 K/uL — ABNORMAL HIGH (ref 150–400)
RBC: 2.94 MIL/uL — ABNORMAL LOW (ref 4.22–5.81)
RDW: 21.2 % — ABNORMAL HIGH (ref 11.5–15.5)
Smear Review: NORMAL
WBC Count: 13.3 K/uL — ABNORMAL HIGH (ref 4.0–10.5)
nRBC: 0 % (ref 0.0–0.2)

## 2023-07-20 LAB — SAMPLE TO BLOOD BANK

## 2023-07-20 MED ORDER — AMOXICILLIN-POT CLAVULANATE 875-125 MG PO TABS: 1.0000 | ORAL_TABLET | Freq: Two times a day (BID) | ORAL | 0 refills | Status: DC

## 2023-07-20 MED ORDER — SODIUM CHLORIDE 0.9% FLUSH
10.0000 mL | Freq: Once | INTRAVENOUS | Status: AC
Start: 2023-07-20 — End: 2023-07-20
  Administered 2023-07-20: 10 mL

## 2023-07-20 MED ORDER — SODIUM CHLORIDE 0.9 % IV SOLN: INTRAVENOUS | Status: DC

## 2023-07-20 NOTE — Progress Notes (Signed)
 [facility] [phone]   [phone]  PROGRESS NOTE  Patient Care Team: Rolinda Millman, MD as PCP - General (Family Medicine)  Hematological/Oncological History # Metastatic Adenocarcinoma of the Lung  01/07/2020 : CT of the chest with contrast performed which showed interval enlargement of a spiculated nodule in the central left upper lobe measuring 1.2 x 1.0 cm and highly suspicious for primary lung malignancy 02/06/2020 :PET scan performed which showed a hypermetabolic irregular solid 1.3 cm left upper lobe pulmonary nodule compatible with malignancy without any other hypermetabolic lesions 05/04/7975- 04/19/2020:  SBRT to the lung lesion 04/28/2021: CT C/A/P showed multifocal lytic bone metastases are identified. Lesion within the spine of the left scapula and lesions involving bilateral iliac bones and left sacral wing. Establish care with Dr. Federico  05/03/2021: biopsy of right iliac lytic lesion showed metastatic carcinoma, consistent with lung primary.  07/08/2021: Cycle 1 Day 1 of Carbo/Pem/Pem 07/29/2021: Cycle 2 Day 1 of Carbo/Pem/Pem 08/11/2021-08/24/2021: Received palliative radiation to osseous metastasis in the left shoulder. 30 Gy in 10 fractions.  08/19/2021: Cycle 3 Day 1 of Carbo/Pem/Pem 09/09/2021: Cycle 4 Day 1 of Carbo/Pem/Pem 09/23/2021: CT CAP: stable disease 09/30/2021: Cycle 5 Day 1 of Pem/Pem 10/21/2021: Cycle 6 Day 1 of Pembrolizumab . Held pemetrexed  due to anemia. 11/16/2021: Cycle 7 Day 1 of Pem/Pem.   12/16/2021: Cycle 8 Day 1 of Pem/Pem.   01/06/2022: Cycle 9 Day 1 of Pem/Pem.  01/27/2022: Cycle 10 Day 1 of Pem/Pem 02/17/2022: Cycle 11 Day 1 of Pem/Pem 03/10/2022: Cycle 12 Day 1 of Pem/Pem (HELD due to anemia with Hgb of 6.4) 03/17/2022: Cycle 12 Day 1 of Pem/Pem (HELD pemextrexed due to cytopenias/kidney function) 04/10/2022: Cycle 13 Day 1 of Pem/Pem (HELD pemextrexed due to cytopenias/kidney function) 04/28/2022: Cycle 14 Day 1 of Pem/Pem  (HELD pemextrexed due to cytopenias/kidney function) 05/23/2022:  Cycle 15 Day 1 of Pem/Pem (HELD pemextrexed due to cytopenias/kidney function) 06/15/2022: Cycle 16 Day 1 of Pembrolizumab .  07/07/2022: Cycle 17 Day 1 of Pembrolizumab .  07/28/2022: Cycle 18 Day 1 of Pembrolizumab  08/18/2022: Cycle 19 Day 1 of Pembrolizumab  HELD due to hyponatremia of 118, new bone metastases. 08/25/2022: Cycle 19 Day 1 of Pembrolizumab  08/28/2022-09/13/2022: Received palliative radiation to right pelvis with total dose of 30 Gy in 10 Fx.  09/22/2022:  Cycle 20 Day 1 of Pembrolizumab  10/13/2022:  Cycle 21 Day 1 of Pembrolizumab  12/11/2022: Cycle 1 Day 1 of Docetaxel /Ramucircumab.  01/05/2023: Cycle 2 Day 1 of Docetaxel /Ramucircumab.  01/26/2023: Cycle 3 Day 1 of Docetaxel /Ramucircumab 02/16/2023:  Cycle 4 Day 1 of Docetaxel /Ramucircumab 03/09/2023: Cycle 5 Day 1 of Docetaxel /Ramucircumab 03/30/2023:  Cycle 6 Day 1 of Docetaxel /Ramucircumab 04/20/2023-04/23/2023: Admitted for slurred speech and weakness. MRI brain concerning for stroke. CTA chest found progression of irregular consolidation in the left apex.  04/27/2023: Cycle 1, Day 1 of Gemcitabine  05/25/2023: Cycle 2, Day 1 of Gemcitabine  06/22/2023: Cycle 3, Day 1 of Gemcitabine   #Adenocarcinoma of the Prostate, T1cN0M0 08/19/2019-10/07/2019: 70 Gy in 28 fractions of 2.5 Gy.  Radiation to the prostate was under the care of Dr. Donnice Barge  Interval History:  Terry Page. 68 y.o. male with medical history significant for metastatic adenocarcinoma of the lung who presents for a follow up visit. The patient's last visit was on 07/05/2023. In the interim since the last visit, he has continued on single agent gemcitabine .  On exam today, Mr. Dotzler reports he is disheartened by the fact he is lost 2 pounds in interim since her  last visit.  He reports he has also been having some sweats at night but no cough or fevers or chills.  He reports he is not having any  lightheadedness, dizziness, or shortness of breath.  He is not having any chest pain or chest tightness.  He notes that he does constantly have runny nose but no sore throat.  He notes that he is not having any nausea, vomiting, or diarrhea.  He reports his pain is under good control and he is not having any trouble with constipation.  He notes he is doing his best to try to drink water.  He also enjoys drinking sweet tea and coffee.  Otherwise he has had no changes in his health since our last visit.  A full 10 point ROS is otherwise negative.  The bulk of our discussion focused on the results of his CT scan.  We discussed the apparent pneumonia at the base of the lung as well as his progression of disease.  He voiced understanding of our findings.  We will cancel his chemotherapy today and instead provide him with fluids.  MEDICAL HISTORY:  Past Medical History:  Diagnosis Date   Alcohol abuse    Asthma    Bullous emphysema ([name])    Essential hypertension 04/10/2020   Hemorrhoids    History of radiation therapy 04/08/20-04/19/20   IMRT- Left lung- Dr. Lynwood Nasuti   Incidental lung nodule, greater than or equal to 8mm 10/22/2017   Left upper lobe - discovered on CTA   Prostate cancer ([name])    Spontaneous pneumothorax 10/20/2017   right   Tobacco abuse     SURGICAL HISTORY: Past Surgical History:  Procedure Laterality Date   BACK SURGERY     IR IMAGING GUIDED PORT INSERTION  06/29/2021   PROSTATE BIOPSY      SOCIAL HISTORY: Social History   Socioeconomic History   Marital status: Single    Spouse name: Not on file   Number of children: Not on file   Years of education: Not on file   Highest education level: Not on file  Occupational History   Occupation: retired  Tobacco Use   Smoking status: Former    Current packs/day: 0.00    Types: Cigarettes    Quit date: 11/22/2019    Years since quitting: 3.6   Smokeless tobacco: Never   Tobacco comments:    Patient reports quit  3 years ago. 06/25/20. [facility]  Vaping Use   Vaping status: Never Used  Substance and Sexual Activity   Alcohol use: Yes    Alcohol/week: 12.0 standard drinks of alcohol    Types: 12 Cans of beer per week    Comment: daily sometimes   Drug use: No   Sexual activity: Not Currently  Other Topics Concern   Not on file  Social History Narrative   Not on file   Social Drivers of Health   Financial Resource Strain: Not on file  Food Insecurity: No Food Insecurity (04/22/2023)   Hunger Vital Sign    Worried About Running Out of Food in the Last Year: Never true    Ran Out of Food in the Last Year: Never true  Transportation Needs: No Transportation Needs (04/22/2023)   PRAPARE - Administrator, Civil Service (Medical): No    Lack of Transportation (Non-Medical): No  Recent Concern: Transportation Needs - Unmet Transportation Needs (04/21/2023)   PRAPARE - Administrator, Civil Service (Medical): Yes  Lack of Transportation (Non-Medical): No  Physical Activity: Not on file  Stress: Not on file  Social Connections: Moderately Integrated (04/21/2023)   Social Connection and Isolation Panel    Frequency of Communication with Friends and Family: More than three times a week    Frequency of Social Gatherings with Friends and Family: More than three times a week    Attends Religious Services: 1 to 4 times per year    Active Member of [employer] or Organizations: No    Attends Banker Meetings: Never    Marital Status: Married  Catering manager Violence: Not At Risk (04/22/2023)   Humiliation, Afraid, Rape, and Kick questionnaire    Fear of Current or Ex-Partner: No    Emotionally Abused: No    Physically Abused: No    Sexually Abused: No    FAMILY HISTORY: Family History  Problem Relation Age of Onset   Cancer Cousin        maternal cousin   Cancer Cousin        paternal cousin   Cancer Cousin    Colon polyps Neg Hx    Pancreatic disease Neg Hx     Pancreatic cancer Neg Hx    Breast cancer Neg Hx    Colon cancer Neg Hx     ALLERGIES:  is allergic to morphine  and oxycodone .  MEDICATIONS:  Current Outpatient Medications  Medication Sig Dispense Refill   amoxicillin -clavulanate (AUGMENTIN ) 875-125 MG tablet Take 1 tablet by mouth 2 (two) times daily. 10 tablet 0   acetaminophen  (TYLENOL ) 325 MG tablet Take 2 tablets (650 mg total) by mouth every 6 (six) hours as needed for mild pain or headache (fever >/= 101).     albuterol  (VENTOLIN  HFA) 108 (90 Base) MCG/ACT inhaler Inhale 2 puffs into the lungs every 6 (six) hours as needed for wheezing or shortness of breath.     [Paused] amLODipine  (NORVASC ) 10 MG tablet Take 1 tablet by mouth daily.     aspirin  EC 325 MG tablet Take 1 tablet (325 mg total) by mouth daily. 30 tablet 1   atorvastatin  (LIPITOR) 40 MG tablet Take 1 tablet (40 mg total) by mouth daily. 30 tablet 1   cyanocobalamin  (VITAMIN B12) 1000 MCG tablet Take 1 tablet (1,000 mcg total) by mouth daily. (Patient taking differently: Take 1,000 mcg by mouth as needed (When directed by physican).) 90 tablet 1   dronabinol  (MARINOL ) 5 MG capsule Take 1 capsule (5 mg total) by mouth 2 (two) times daily before lunch and supper. 60 capsule 0   ferrous sulfate  325 (65 FE) MG EC tablet Take 1 tablet (325 mg total) by mouth daily with breakfast. 30 tablet 3   folic acid  (FOLVITE ) 1 MG tablet Take 1 tablet (1 mg total) by mouth daily. 90 tablet 3   hydrOXYzine  (ATARAX ) 25 MG tablet Take 1 tablet (25 mg total) by mouth at bedtime as needed for itching. 30 tablet 11   ipratropium-albuterol  (DUONEB) 0.5-2.5 (3) MG/3ML SOLN Take 3 mLs by nebulization every 6 (six) hours as needed. (Patient taking differently: Take 3 mLs by nebulization every 6 (six) hours as needed (shortness of breathing or wheezing).) 360 mL 0   lidocaine  (LIDODERM ) 5 % Place 1 patch onto the skin daily. Remove & Discard patch within 12 hours or as directed by MD. Apply to low back  30 patch 0   lidocaine -prilocaine  (EMLA ) cream Apply 1 Application topically as needed. 30 g 0   mirtazapine  (REMERON ) 7.5 MG  tablet Take 1 tablet (7.5 mg total) by mouth at bedtime. 30 tablet 0   [Paused] olmesartan (BENICAR) 40 MG tablet Take 40 mg by mouth daily.     polyethylene glycol (MIRALAX ) 17 g packet Take 17 g by mouth daily. 30 each 2   potassium chloride  SA (KLOR-CON  M) 20 MEQ tablet TAKE 1 TABLET(20 MEQ) BY MOUTH DAILY 90 tablet 0   SENNA S 8.6-50 MG tablet Take 2 tablets by mouth at bedtime as needed for mild constipation or moderate constipation.     senna-docusate (STIMULANT LAXATIVE) 8.6-50 MG tablet Take 2 tablets by mouth 2 (two) times daily. May add additional tablet as needed 150 tablet 3   sodium chloride  1 g tablet Take 1 tablet (1 g total) by mouth 3 (three) times daily with meals. 90 tablet 3   thiamine  100 MG tablet Take 1 tablet (100 mg total) by mouth daily.     traMADol  (ULTRAM ) 50 MG tablet Take 1-2 tablets (50-100 mg total) by mouth every 6 (six) hours as needed. 120 tablet 0   No current facility-administered medications for this visit.   Facility-Administered Medications Ordered in Other Visits  Medication Dose Route Frequency Provider Last Rate Last Admin   0.9 %  sodium chloride  infusion   Intravenous Continuous Federico Norleen DASEN IV, MD 750 mL/hr at 07/20/23 0946 New Bag at 07/20/23 0946    REVIEW OF SYSTEMS:   Constitutional: ( - ) fevers, ( - )  chills , ( - ) night sweats Eyes: ( - ) blurriness of vision, ( - ) double vision, ( - ) watery eyes Ears, nose, mouth, throat, and face: ( - ) mucositis, ( - ) sore throat Respiratory: ( - ) cough, ( - ) dyspnea, ( - ) wheezes Cardiovascular: ( - ) palpitation, ( - ) chest discomfort, ( - ) lower extremity swelling Gastrointestinal:  ( - ) nausea, ( - ) heartburn, ( - ) change in bowel habits Skin: ( - ) abnormal skin rashes Lymphatics: ( - ) new lymphadenopathy, ( - ) easy bruising Neurological: ( - ) numbness,  ( - ) tingling, ( - ) new weaknesses Behavioral/Psych: ( - ) mood change, ( - ) new changes  All other systems were reviewed with the patient and are negative.  PHYSICAL EXAMINATION: ECOG PERFORMANCE STATUS: 1 - Symptomatic but completely ambulatory  Vitals:   07/20/23 0857  BP: 119/75  Pulse: (!) 132  Resp: 17  Temp: 98.3 F (36.8 C)  SpO2: 94%   Filed Weights   07/20/23 0857  Weight: 138 lb 8 oz (62.8 kg)    GENERAL: Well-appearing elderly African-American male, alert, no distress and comfortable SKIN: skin color, texture, turgor are normal, no rashes or significant lesions EYES: conjunctiva are pink and non-injected, sclera clear LUNGS: clear to auscultation and percussion with normal breathing effort HEART: regular rate & rhythm and no murmurs and no lower extremity edema Musculoskeletal: no cyanosis of digits and no clubbing  PSYCH: alert & oriented x 3, fluent speech NEURO: no focal motor/sensory deficits  LABORATORY DATA:  I have reviewed the data as listed    Latest Ref Rng & Units 07/20/2023    8:34 AM 07/05/2023    8:51 AM 06/29/2023    8:55 AM  CBC  WBC 4.0 - 10.5 K/uL 13.3  5.5  6.1   Hemoglobin 13.0 - 17.0 g/dL 8.7  8.0  8.6   Hematocrit 39.0 - 52.0 % 26.8  24.0  26.5  Platelets 150 - 400 K/uL 767  286  577        Latest Ref Rng & Units 07/20/2023    8:34 AM 07/05/2023    8:51 AM 06/29/2023    8:55 AM  CMP  Glucose 70 - 99 mg/dL 885  89  98   BUN 8 - 23 mg/dL 10  11  8    Creatinine 0.61 - 1.24 mg/dL 9.09  9.24  9.17   Sodium 135 - 145 mmol/L 131  134  137   Potassium 3.5 - 5.1 mmol/L 3.7  3.9  4.0   Chloride 98 - 111 mmol/L 96  102  104   CO2 22 - 32 mmol/L 27  26  25    Calcium  8.9 - 10.3 mg/dL 8.9  9.1  9.3   Total Protein 6.5 - 8.1 g/dL 7.1  6.5  6.9   Total Bilirubin 0.0 - 1.2 mg/dL 0.3  0.2  0.2   Alkaline Phos 38 - 126 U/L 74  56  71   AST 15 - 41 U/L 37  20  20   ALT 0 - 44 U/L 26  16  13      Lab Results  Component Value Date   MPROTEIN  Not Observed 04/28/2021   Lab Results  Component Value Date   KPAFRELGTCHN 33.6 (H) 04/28/2021   LAMBDASER 22.0 04/28/2021   KAPLAMBRATIO 1.53 04/28/2021    RADIOGRAPHIC STUDIES: CT CHEST ABDOMEN PELVIS W CONTRAST Result Date: 07/17/2023 CLINICAL DATA:  Metastatic non-small cell lung cancer restaging, additional history of metastatic prostate cancer * Tracking Code: BO * EXAM: CT CHEST, ABDOMEN, AND PELVIS WITH CONTRAST TECHNIQUE: Multidetector CT imaging of the chest, abdomen and pelvis was performed following the standard protocol during bolus administration of intravenous contrast. RADIATION DOSE REDUCTION: This exam was performed according to the departmental dose-optimization program which includes automated exposure control, adjustment of the mA and/or kV according to patient size and/or use of iterative reconstruction technique. CONTRAST:  OMNIPAQUE  IOHEXOL  300 MG/ML  SOLN COMPARISON:  CT chest angiogram, 04/20/2023, CT chest abdomen pelvis, 02/15/2023 FINDINGS: CT CHEST FINDINGS Cardiovascular: Right chest port catheter. Aortic atherosclerosis. Normal heart size. Three-vessel coronary artery calcifications no pericardial effusion. Mediastinum/Nodes: No enlarged mediastinal, hilar, or axillary lymph nodes. Thyroid  gland, trachea, and esophagus demonstrate no significant findings. Lungs/Pleura: Severe emphysema, bullous in the left upper lobe. When compared to most recent imaging of the chest dated 04/20/2023, no significant change in a treated mass of the left upper lobe measuring 2.8 x 1.8 cm (series 4, image 31). There are however numerous new enlarged bilateral pulmonary nodules, for example a 1.1 x 1.0 cm nodule of the dependent left lower lobe, previously no greater than 0.4 cm (series 4, image 76). Example new nodule in the peripheral right lower lobe measures 0.4 cm (series 4, image 111). New irregular consolidative opacity in the dependent right lower lobe measuring 2.9 x 2.3 cm  (series 4, image 79). Trace pleural effusions, unchanged. Musculoskeletal: No chest wall abnormality. No acute osseous findings. No significant change in a sclerotic osseous metastasis and pathologic fracture of the left scapular body (series 4, image 9). Disc degenerative disease and bridging osteophytosis throughout the thoracic spine in keeping with DISH. CT ABDOMEN PELVIS FINDINGS Hepatobiliary: No solid liver abnormality is seen. Benign simple cysts requiring no further follow-up or characterization as well as tiny low-attenuation lesions too small to confidently characterize although most likely tiny cysts. Contracted gallbladder. No gallstones, gallbladder wall thickening, or biliary  dilatation. Pancreas: Unremarkable. No pancreatic ductal dilatation or surrounding inflammatory changes. Spleen: Normal in size without significant abnormality. Adrenals/Urinary Tract: Adrenal glands are unremarkable. Unchanged intermediate attenuation lesion of the peripheral superior pole of the right kidney measuring 1.3 x 1.2 cm (series 2, image 60). Additional simple fluid attenuation renal cortical cysts, benign, requiring no further follow-up or characterization. Kidneys are otherwise normal, without renal calculi, solid lesion, or hydronephrosis. Bladder is unremarkable. Stomach/Bowel: Stomach is within normal limits. Appendix appears normal. No evidence of bowel wall thickening, distention, or inflammatory changes. Vascular/Lymphatic: Severe aortic atherosclerosis. No enlarged abdominal or pelvic lymph nodes. Reproductive: Mild prostatomegaly. Other: No abdominal wall hernia or abnormality. No ascites. Musculoskeletal: No acute osseous findings. Some interval sclerotic healing of multiple osseous metastases throughout the pelvis, including nonacute pathologic fractures of the left sacral ala (series 2, image 90). IMPRESSION: 1. When compared to most recent imaging of the chest dated 04/20/2023, no significant change in a  treated mass of the left upper lobe. There are however numerous new and enlarged bilateral pulmonary nodules, consistent with worsened pulmonary metastatic disease. 2. New irregular consolidative opacity in the dependent right lower lobe measuring 2.9 x 2.3 cm, most likely related to aspiration given appearance and distribution. 3. Unchanged appearance of a left scapular body metastasis with associated nonacute pathologic fracture. 4. When compared to most recent imaging of the abdomen and pelvis dated 02/15/2023, some interval sclerotic healing of multiple osseous metastases throughout the pelvis, including nonacute pathologic fractures of the left sacral ala. 5. Unchanged intermediate attenuation lesion of the peripheral superior pole of the right kidney measuring 1.3 x 1.2 cm. This remains suspicious for a small incidentally present renal cell carcinoma. Continued attention on follow-up in the setting of other known metastatic malignancy. 6. Severe emphysema. 7. Coronary artery disease. Aortic Atherosclerosis (ICD10-I70.0) and Emphysema (ICD10-J43.9). Electronically Signed   By: Marolyn JONETTA Jaksch M.D.   On: 07/17/2023 22:09      ASSESSMENT & PLAN Terry Page. Is a 68 y.o.  male with medical history significant for metastatic adenocarcinoma of the lung who presents for a follow up visit.  # Metastatic Adenocarcinoma of the Lung  -- NGS testing from this patient shows no evidence of targetable mutation. --MRI brain shows no evidence of intracranial metastases --Started carboplatin , pembrolizumab , and pemetrexed  as treatment for his cancer on 07/08/2021. --Received palliative radiation to osseous metastasis of the left shoulder from 08/11/2021-08/24/2021. Planned dose is 30 Gy in 10 fx.  --Discontinued Pemetrexed  on 03/17/2022 due to persistent anemia and kidney dysfunction. --Restaging CT CAP from 08/09/2022 shows interval enlargement of multiple mixed lytic and sclerotic bone metastases involving the  right pelvis. Other bone metastases including of the left scapula, sacrum, and left hemipelvis are unchanged. Unchanged treated central LUL mass. Recommend to continue on pembrolizumab  therapy and arrange for palliative radiation to enlarging bone metastases.  --CTA chest from 04/20/2023 showed progression of disease to the consolidation in the left apex. Recommended to change therapy  to single agent Gemcitabine  3 weeks on, 1 week off.  --Started Cycle 1, Day 1 of Gemcitabine  on 04/27/2023 Plan: --Due for Cycle 4, Day 1 of Gemcitabine  today, HELD due to progression.  --labs from today were reviewed. Labs show white blood cell count 13.3, Hgb 8.7, MCV 91.2, Plt 767. Creatinine and LFTs normal.  --patient progressed on 3rd line therapy, no targetable mutations. Unclear if there is another line of therapy to offer. Will discuss with thoracic oncology expert.  --RTC for labs and  visit in 2 weeks.   # Tachycardia # Sweats # Concern for Aspiration Pneumonia -- Will provide patient with 1 L of fluids today due to his tachycardia and sweats. -- Prescribed Augmentin  for a 5-day course for the treatment of a possible aspiration pneumonia -- Will have patient return to clinic in 1 to 2 weeks to assure he is improving.   #Leukocytosis-neutrophil predominant: --Patient denies any infectious symptoms, elevation is likely secondary to growth factor --Held GCSF injection with Cycle 4 of Ram/Docetaxel . Continue to hold injection for future cycles.   #Left lower eyelid stye--resolved: --Applying warm compresses, recommend to continue --Completed 7 day course of doxycycline  100 mg BID.   #Left sided low back pain with sciatica: --History of lumbar spondylosis and moderate to severe facet arthrosis without spinal stenosis and mild right neural foraminal stenosis at L4-5 seen on MRI lumbar 04/24/2020.   #Right hip pain-stable --Secondary progressive bone metastases in right pelvis seen on CT imaging from  08/09/2022 --Received palliative radiation to the right pelvis from 08/28/2022-09/13/2022 with total dose of 30 Gy in 10 Fx.  --Pain is slightly improved but still present.  --Continue to take tramadol  q 6 hours for pain control.   #Hyponatremia-improved --Chronic in nature --Sodium level dropped from 127 on 08/09/2022 to 118.  Today sodium is stable  --Etiologies including SIADH, immunotherapy induced, too much water intake.  --Patient is asymptomatic without nausea/vomiting, headaches, confusion, seizures, etc --Currently on salt tablets 1gm three times daily.  --Strict ED precautions given for any new symptoms as discussed above.   # Iron Deficiency Anemia #Chemotherapy induced anemia --Currently on ferrous sulfate  325 mg PO daily --No evidence of iron/b12/folate deficiency per labs from 04/22/2023.   #Pain Control --stopped oxycodone  due to itching --currently following with Palliative care.  --pain well controlled with tramadol  as needed. Continue to take tramdol 50-100 mg q 6 hours as needed. --Continue senna docusate and miralax  for constipation prophylaxis --Continue to monitor  #Supportive Care -- chemotherapy education complete -- port placed -- zofran  8mg  q8H PRN and compazine  10mg  PO q6H for nausea -- EMLA  cream for port -- Patient referred to palliative care for pain control. Pain control as above.    No orders of the defined types were placed in this encounter.   All questions were answered. The patient knows to call the clinic with any problems, questions or concerns.  I have spent a total of 30 minutes minutes of face-to-face and non-face-to-face time, preparing to see the patient, performing a medically appropriate examination, counseling and educating the patient, referring and communicating with other health care professionals, documenting clinical information in the electronic health record, and care coordination.   Norleen IVAR Kidney, MD Department of  Hematology/Oncology [facility] Phone: [phone] Pager: [phone] Email: [email or web address]  07/20/2023 10:28 AM

## 2023-07-20 NOTE — Progress Notes (Signed)
 Nutrition Follow-up:  Patient with metastatic lung cancer of right upper lobe. He completed 6 cycles docetaxel /ramucircumab. Pt currently receiving gemcitabine  (start 4/25)   4/18-4/21 - admit for slurred speech/weakness concerning for stroke, noted progression in left apex   Met with patient in infusion. He reports appetite is good. Eating 3 meals, snacks, and supplementing with 2-3 Ensure Complete. Yesterday had eggs, sausage, toast, orange juice at breakfast, ham/cheese sandwich with a coke for lunch, snacked on chips and sweet tea. Patient had tuna fish (egg/mayo) sandwich for dinner. Had 3 Ensure Complete (10AM, 2PM, 6PM). Denies nausea, vomiting, diarrhea, constipation.    Medications: reviewed   Labs: Na 131, glucose 114, albumin 3.0  Anthropometrics: Wt 138 lb 8 oz today - stable x 3 weeks  6/27 - 138 lb 12.8 oz 6/20 - 140 lb 4.8 oz    NUTRITION DIAGNOSIS: Unintended wt loss - stable   INTERVENTION:  Encourage high calorie high protein foods  Reviewed protein sources, recommend protein food at every meal/snack Continue 3 Ensure Complete - suggest one at bedtime (samples + coupons) Support and encouragement     MONITORING, EVALUATION, GOAL: wt trends, intake   NEXT VISIT: Friday August 1 during infusion

## 2023-07-27 ENCOUNTER — Inpatient Hospital Stay

## 2023-07-27 ENCOUNTER — Other Ambulatory Visit: Payer: Self-pay | Admitting: Hematology and Oncology

## 2023-07-27 ENCOUNTER — Ambulatory Visit

## 2023-07-27 ENCOUNTER — Telehealth: Payer: Self-pay | Admitting: *Deleted

## 2023-07-27 DIAGNOSIS — D649 Anemia, unspecified: Secondary | ICD-10-CM

## 2023-07-27 DIAGNOSIS — Z95828 Presence of other vascular implants and grafts: Secondary | ICD-10-CM

## 2023-07-27 DIAGNOSIS — Z5111 Encounter for antineoplastic chemotherapy: Secondary | ICD-10-CM | POA: Diagnosis not present

## 2023-07-27 DIAGNOSIS — E860 Dehydration: Secondary | ICD-10-CM

## 2023-07-27 DIAGNOSIS — C3412 Malignant neoplasm of upper lobe, left bronchus or lung: Secondary | ICD-10-CM

## 2023-07-27 LAB — CBC WITH DIFFERENTIAL (CANCER CENTER ONLY)
Abs Immature Granulocytes: 0.54 K/uL — ABNORMAL HIGH (ref 0.00–0.07)
Basophils Absolute: 0.1 K/uL (ref 0.0–0.1)
Basophils Relative: 0 %
Eosinophils Absolute: 1.1 K/uL — ABNORMAL HIGH (ref 0.0–0.5)
Eosinophils Relative: 8 %
HCT: 26 % — ABNORMAL LOW (ref 39.0–52.0)
Hemoglobin: 8.7 g/dL — ABNORMAL LOW (ref 13.0–17.0)
Immature Granulocytes: 4 %
Lymphocytes Relative: 6 %
Lymphs Abs: 0.9 K/uL (ref 0.7–4.0)
MCH: 29.4 pg (ref 26.0–34.0)
MCHC: 33.5 g/dL (ref 30.0–36.0)
MCV: 87.8 fL (ref 80.0–100.0)
Monocytes Absolute: 2.8 K/uL — ABNORMAL HIGH (ref 0.1–1.0)
Monocytes Relative: 20 %
Neutro Abs: 8.4 K/uL — ABNORMAL HIGH (ref 1.7–7.7)
Neutrophils Relative %: 62 %
Platelet Count: 762 K/uL — ABNORMAL HIGH (ref 150–400)
RBC: 2.96 MIL/uL — ABNORMAL LOW (ref 4.22–5.81)
RDW: 20.7 % — ABNORMAL HIGH (ref 11.5–15.5)
WBC Count: 13.8 K/uL — ABNORMAL HIGH (ref 4.0–10.5)
nRBC: 0 % (ref 0.0–0.2)

## 2023-07-27 LAB — IRON AND IRON BINDING CAPACITY (CC-WL,HP ONLY)
Iron: 22 ug/dL — ABNORMAL LOW (ref 45–182)
Saturation Ratios: 9 % — ABNORMAL LOW (ref 17.9–39.5)
TIBC: 239 ug/dL — ABNORMAL LOW (ref 250–450)
UIBC: 217 ug/dL (ref 117–376)

## 2023-07-27 LAB — VITAMIN B12: Vitamin B-12: 395 pg/mL (ref 180–914)

## 2023-07-27 LAB — FOLATE: Folate: 33.3 ng/mL (ref >5.9)

## 2023-07-27 LAB — CMP (CANCER CENTER ONLY)
ALT: 30 U/L (ref 0–44)
AST: 39 U/L (ref 15–41)
Albumin: 3 g/dL — ABNORMAL LOW (ref 3.5–5.0)
Alkaline Phosphatase: 77 U/L (ref 38–126)
Anion gap: 6 (ref 5–15)
BUN: 13 mg/dL (ref 8–23)
CO2: 29 mmol/L (ref 22–32)
Calcium: 9.3 mg/dL (ref 8.9–10.3)
Chloride: 99 mmol/L (ref 98–111)
Creatinine: 0.82 mg/dL (ref 0.61–1.24)
GFR, Estimated: >60 mL/min (ref >60)
Glucose, Bld: 92 mg/dL (ref 70–99)
Potassium: 3.6 mmol/L (ref 3.5–5.1)
Sodium: 134 mmol/L — ABNORMAL LOW (ref 135–145)
Total Bilirubin: 0.2 mg/dL (ref 0.0–1.2)
Total Protein: 7.6 g/dL (ref 6.5–8.1)

## 2023-07-27 LAB — RETIC PANEL
Immature Retic Fract: 40.8 % — ABNORMAL HIGH (ref 2.3–15.9)
RBC.: 3 MIL/uL — ABNORMAL LOW (ref 4.22–5.81)
Retic Count, Absolute: 76.5 K/uL (ref 19.0–186.0)
Retic Ct Pct: 2.6 % (ref 0.4–3.1)
Reticulocyte Hemoglobin: 24.3 pg — ABNORMAL LOW (ref >27.9)

## 2023-07-27 LAB — FERRITIN: Ferritin: 653 ng/mL — ABNORMAL HIGH (ref 24–336)

## 2023-07-27 LAB — SAMPLE TO BLOOD BANK

## 2023-07-27 LAB — LACTATE DEHYDROGENASE: LDH: 165 U/L (ref 98–192)

## 2023-07-27 MED ORDER — SODIUM CHLORIDE 0.9 % IV SOLN: INTRAVENOUS | Status: DC

## 2023-07-27 MED ORDER — SODIUM CHLORIDE 0.9% FLUSH
10.0000 mL | Freq: Once | INTRAVENOUS | Status: AC
  Administered 2023-07-27: 10 mL

## 2023-07-27 MED ORDER — HEPARIN SOD (PORK) LOCK FLUSH 100 UNIT/ML IV SOLN
500.0000 [IU] | Freq: Once | INTRAVENOUS | Status: AC
Start: 2023-07-27 — End: 2023-07-27
  Administered 2023-07-27: 500 [IU]

## 2023-07-27 NOTE — Patient Instructions (Signed)
 Dehydration, Adult Dehydration is a condition in which there is not enough water or other fluids in the body. This happens when a person loses more fluids than they take in. Important organs cannot work right without the right amount of fluids. Any loss of fluids from the body can cause dehydration. Dehydration can be mild, worse, or very bad. It should be treated right away to keep it from getting very bad. What are the causes? Conditions that cause loss of water in the body. They include: Watery poop (diarrhea). Vomiting. Sweating a lot. Fever. Infection. Peeing (urinating) a lot. Not drinking enough fluids. Certain medicines, such as medicines that take extra fluid out of the body (diuretics). Lack of safe drinking water. Not being able to get enough water and food. What increases the risk? Having a long-term (chronic) illness that has not been treated the right way, such as: Diabetes. Heart disease. Kidney disease. Being 25 years of age or older. Having a disability. Living in a place that is high above the ground or sea (high in altitude). The thinner, drier air causes more fluid loss. Doing exercises that put stress on your body for a long time. Being active when in hot places. What are the signs or symptoms? Symptoms of dehydration depend on how bad it is. Mild or worse dehydration Thirst. Dry lips or dry mouth. Feeling dizzy or light-headed. Muscle cramps. Passing little pee or dark pee. Pee may be the color of tea. Headache. Very bad dehydration Changes in skin. Skin may: Be cold to the touch (clammy). Be blotchy or pale. Not go back to normal right after you pinch it and let it go. Little or no tears, pee, or sweat. Fast breathing. Low blood pressure. Weak pulse. Pulse that is more than 100 beats a minute when you are sitting still. Other changes, such as: Feeling very thirsty. Eyes that look hollow (sunken). Cold hands and feet. Being confused. Being very  tired (lethargic) or having trouble waking from sleep. Losing weight. Loss of consciousness. How is this treated? Treatment for this condition depends on how bad your dehydration is. Treatment should start right away. Do not wait until your condition gets very bad. Very bad dehydration is an emergency. You will need to go to a hospital. Mild or worse dehydration can be treated at home. You may be asked to: Drink more fluids. Drink an oral rehydration solution (ORS). This drink gives you the right amount of fluids, salts, and minerals (electrolytes). Very bad dehydration can be treated: With fluids through an IV tube. By correcting low levels of electrolytes in the body. By treating the problem that caused your dehydration. Follow these instructions at home: Oral rehydration solution If told by your doctor, drink an ORS: Make an ORS. Use instructions on the package. Start by drinking small amounts, about  cup (120 mL) every 5-10 minutes. Slowly drink more until you have had the amount that your doctor said to have.  Eating and drinking  Drink enough clear fluid to keep your pee pale yellow. If you were told to drink an ORS, finish the ORS first. Then, start slowly drinking other clear fluids. Drink fluids such as: Water. Do not drink only water. Doing that can make the salt (sodium) level in your body get too low. Water from ice chips you suck on. Fruit juice that you have added water to (diluted). Low-calorie sports drinks. Eat foods that have the right amounts of salts and minerals, such as bananas, oranges, potatoes,  tomatoes, or spinach. Do not drink alcohol. Avoid drinks that have caffeine or sugar. These include:: High-calorie sports drinks. Fruit juice that you did not add water to. Soda. Coffee or energy drinks. Avoid foods that are greasy or have a lot of fat or sugar. General instructions Take over-the-counter and prescription medicines only as told by your doctor. Do  not take sodium tablets. Doing that can make the salt level in your body get too high. Return to your normal activities as told by your doctor. Ask your doctor what activities are safe for you. Keep all follow-up visits. Your doctor may check and change your treatment. Contact a doctor if: You have pain in your belly (abdomen) and the pain: Gets worse. Stays in one place. You have a rash. You have a stiff neck. You get angry or annoyed more easily than normal. You are more tired or have a harder time waking than normal. You feel weak or dizzy. You feel very thirsty. Get help right away if: You have any symptoms of very bad dehydration. You vomit every time you eat or drink. Your vomiting gets worse, does not go away, or you vomit blood or green stuff. You are getting treatment, but symptoms are getting worse. You have a fever. You have a very bad headache. You have: Diarrhea that gets worse or does not go away. Blood in your poop (stool). This may cause poop to look black and tarry. No pee in 6-8 hours. Only a small amount of pee in 6-8 hours, and the pee is very dark. You have trouble breathing. These symptoms may be an emergency. Get help right away. Call 911. Do not wait to see if the symptoms will go away. Do not drive yourself to the hospital. This information is not intended to replace advice given to you by your health care provider. Make sure you discuss any questions you have with your health care provider. Document Revised: 07/18/2021 Document Reviewed: 07/18/2021 Elsevier Patient Education  2024 [employer].

## 2023-07-27 NOTE — Telephone Encounter (Signed)
 Received vm message from pt requesting a call back. TCT patient. Spoke with him. He asked if it was still ok to take his Sodium tablets as his AVS papers from today said not to take sodium tablets.  Advised that since he has been on these quite awhile and that his sodium gets too low when he does not take them,it was fine for him to continue as he has been. Advised to keep his fluid intake up to stay hydrated. Pt voiced understanding.

## 2023-07-30 ENCOUNTER — Other Ambulatory Visit: Payer: Self-pay

## 2023-07-30 DIAGNOSIS — Z515 Encounter for palliative care: Secondary | ICD-10-CM

## 2023-07-30 DIAGNOSIS — G893 Neoplasm related pain (acute) (chronic): Secondary | ICD-10-CM

## 2023-07-30 DIAGNOSIS — C7951 Secondary malignant neoplasm of bone: Secondary | ICD-10-CM

## 2023-07-30 MED ORDER — TRAMADOL HCL 50 MG PO TABS: 50.0000 mg | ORAL_TABLET | Freq: Four times a day (QID) | ORAL | 0 refills | Status: DC | PRN

## 2023-07-30 NOTE — Telephone Encounter (Signed)
 Pt called for medication refill, see associated orders

## 2023-08-02 ENCOUNTER — Encounter: Payer: Self-pay | Admitting: Nurse Practitioner

## 2023-08-02 ENCOUNTER — Inpatient Hospital Stay: Admitting: Nurse Practitioner

## 2023-08-02 VITALS — BP 107/88 | HR 108 | Temp 98.7°F | Resp 18 | Wt 136.2 lb

## 2023-08-02 DIAGNOSIS — G4700 Insomnia, unspecified: Secondary | ICD-10-CM

## 2023-08-02 DIAGNOSIS — R530 Neoplastic (malignant) related fatigue: Secondary | ICD-10-CM

## 2023-08-02 DIAGNOSIS — R630 Anorexia: Secondary | ICD-10-CM | POA: Diagnosis not present

## 2023-08-02 DIAGNOSIS — R634 Abnormal weight loss: Secondary | ICD-10-CM

## 2023-08-02 DIAGNOSIS — K5903 Drug induced constipation: Secondary | ICD-10-CM

## 2023-08-02 DIAGNOSIS — Z515 Encounter for palliative care: Secondary | ICD-10-CM

## 2023-08-02 DIAGNOSIS — C3412 Malignant neoplasm of upper lobe, left bronchus or lung: Secondary | ICD-10-CM

## 2023-08-02 MED ORDER — DRONABINOL 5 MG PO CAPS: 5.0000 mg | ORAL_CAPSULE | Freq: Two times a day (BID) | ORAL | 0 refills | Status: DC

## 2023-08-02 NOTE — Progress Notes (Signed)
 Palliative Medicine [facility]  [phone] [phone]) [phone]   Name: Geovonni Meyerhoff. Date: 08/02/2023 MRN: [id number]  DOB: Apr 04, 1955  Patient Care Team: Rolinda Millman, MD as PCP - General (Family Medicine)    INTERVAL HISTORY: Fabyan Loughmiller. is a 68 y.o. 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 He is currently undergoing chemotherapy. Palliative ask to see for symptom management.   SOCIAL HISTORY:     reports that he quit smoking about 3 years ago. His smoking use included cigarettes. He has never used smokeless tobacco. He reports current alcohol use of about 12.0 standard drinks of alcohol per week. He reports that he does not use drugs.  ADVANCE DIRECTIVES:  None on file   CODE STATUS: Full code  PAST MEDICAL HISTORY: Past Medical History:  Diagnosis Date   Alcohol abuse    Asthma    Bullous emphysema ([name])    Essential hypertension 04/10/2020   Hemorrhoids    History of radiation therapy 04/08/20-04/19/20   IMRT- Left lung- Dr. Lynwood Nasuti   Incidental lung nodule, greater than or equal to 8mm 10/22/2017   Left upper lobe - discovered on CTA   Prostate cancer ([name])    Spontaneous pneumothorax 10/20/2017   right   Tobacco abuse     ALLERGIES:  is allergic to morphine  and oxycodone .  MEDICATIONS:  Current Outpatient Medications  Medication Sig Dispense Refill   acetaminophen  (TYLENOL ) 325 MG tablet Take 2 tablets (650 mg total) by mouth every 6 (six) hours as needed for mild pain or headache (fever >/= 101).     albuterol  (VENTOLIN  HFA) 108 (90 Base) MCG/ACT inhaler Inhale 2 puffs into the lungs every 6 (six) hours as needed for wheezing or shortness of breath.     [Paused] amLODipine  (NORVASC ) 10 MG tablet Take 1 tablet by mouth daily.     amoxicillin -clavulanate  (AUGMENTIN ) 875-125 MG tablet Take 1 tablet by mouth 2 (two) times daily. 10 tablet 0   atorvastatin  (LIPITOR) 40 MG tablet Take 1 tablet (40 mg total) by mouth daily. 30 tablet 1   cyanocobalamin  (VITAMIN B12) 1000 MCG tablet Take 1 tablet (1,000 mcg total) by mouth daily. (Patient taking differently: Take 1,000 mcg by mouth as needed (When directed by physican).) 90 tablet 1   dronabinol  (MARINOL ) 5 MG capsule Take 1 capsule (5 mg total) by mouth 2 (two) times daily before lunch and supper. 60 capsule 0   ferrous sulfate  325 (65 FE) MG EC tablet Take 1 tablet (325 mg total) by mouth daily with breakfast. 30 tablet 3   folic acid  (FOLVITE ) 1 MG tablet Take 1 tablet (1 mg total) by mouth daily. 90 tablet 3   hydrOXYzine  (ATARAX ) 25 MG tablet Take 1 tablet (25 mg total) by mouth at bedtime as needed for itching. 30 tablet 11   ipratropium-albuterol  (DUONEB) 0.5-2.5 (3) MG/3ML SOLN Take 3 mLs by nebulization every 6 (six) hours as needed. (Patient taking differently: Take 3 mLs by nebulization every 6 (six) hours as needed (shortness of breathing or wheezing).) 360 mL 0   lidocaine  (LIDODERM ) 5 % Place 1 patch onto the skin daily. Remove & Discard patch within 12 hours or as directed by MD. Apply to low back 30 patch 0   lidocaine -prilocaine  (EMLA ) cream Apply 1 Application topically as needed. 30 g 0  mirtazapine  (REMERON ) 7.5 MG tablet Take 1 tablet (7.5 mg total) by mouth at bedtime. 30 tablet 0   [Paused] olmesartan (BENICAR) 40 MG tablet Take 40 mg by mouth daily.     polyethylene glycol (MIRALAX ) 17 g packet Take 17 g by mouth daily. 30 each 2   potassium chloride  SA (KLOR-CON  M) 20 MEQ tablet TAKE 1 TABLET(20 MEQ) BY MOUTH DAILY 90 tablet 0   SENNA S 8.6-50 MG tablet Take 2 tablets by mouth at bedtime as needed for mild constipation or moderate constipation.     senna-docusate (STIMULANT LAXATIVE) 8.6-50 MG tablet Take 2 tablets by mouth 2 (two) times daily. May add additional tablet as needed  150 tablet 3   sodium chloride  1 g tablet Take 1 tablet (1 g total) by mouth 3 (three) times daily with meals. 90 tablet 3   thiamine  100 MG tablet Take 1 tablet (100 mg total) by mouth daily.     traMADol  (ULTRAM ) 50 MG tablet Take 1-2 tablets (50-100 mg total) by mouth every 6 (six) hours as needed. 120 tablet 0   No current facility-administered medications for this visit.    VITAL SIGNS: BP 107/88   Pulse (!) 108   Temp 98.7 F (37.1 C)   Resp 18   Wt 136 lb 3.2 oz (61.8 kg)   SpO2 100%   BMI 20.11 kg/m  Filed Weights   08/02/23 0905  Weight: 136 lb 3.2 oz (61.8 kg)     Estimated body mass index is 20.11 kg/m as calculated from the following:   Height as of 05/11/23: 5' 9 (1.753 m).   Weight as of this encounter: 136 lb 3.2 oz (61.8 kg).   PERFORMANCE STATUS (ECOG) : 1 - Symptomatic but completely ambulatory  IMPRESSION: Discussed the use of AI scribe software for clinical note transcription with the patient, who gave verbal consent to proceed.  History of Present Illness Channin Agustin. is a 68 year old male  with metastatic lung cancer with bone involvement who presented to clinic for symptom management follow-up. No acute distress. States he is doing well overall. Denies nausea, vomiting, constipation, or diarrhea. He mentions not having a bowel movement in two days. He is taking Senna but does not have Miralax  at home. Recommended Senna 2 tablets twice daily with a daily dose of Miralax . Occasional fatigue however remains as active as possible.   He has experienced a loss of appetite and weight loss, decreasing from 140 pounds on July 3rd to 136 pounds currently. He attributes his weight loss to a lack of hunger, stating that even when taking mirtazapine , he did not feel hungry. We will check to see if Marinol  is available. Education provided on ways to increase protein intake including small frequent meals, snacking, and protein shakes.   Mr. Davidow reports his pain  is well-controlled with use of tramadol  50-100 mg as needed in addition to lidocaine  patches.  No adjustments to current regimen.  All questions answered and support provided.   I discussed the importance of continued conversation with family and their medical providers regarding overall plan of care and treatment options, ensuring decisions are within the context of the patients values and GOCs. Assessment & Plan Cancer related pain/Back pain Reports pain is well controlled. No adjustments at this time.  - Tramadol  50-100mg  every 6 hours as needed -Lidocaine  patch to back as needed.  Anorexia with associated weight loss Weight decreased from 140 lbs on July 3 to 136  lbs currently. Check on Marinol  (dronabinol ) availablity to stimulate appetite and address weight loss. - Prescribe Marinol  5 mg to be taken twice daily. - Send prescription to [facility]. May no longer be available.  -Patient did not tolerate Mirtazapine .   Constipation Constipation with no bowel movement in two days. Currently using Senna for bowel management. No Miralax  at home. - Provide samples of Miralax  powder. - Instruct to take Miralax  once daily, with the option to increase to twice daily if needed.  I will plan to see patient back in 3-4 weeks. Sooner if needed.   Patient expressed understanding and was in agreement with this plan. He also understands that He can call the clinic at any time with any questions, concerns, or complaints.   Any controlled substances utilized were prescribed in the context of palliative care. PDMP has been reviewed.   Visit consisted of counseling and education dealing with the complex and emotionally intense issues of symptom management and palliative care in the setting of serious and potentially life-threatening illness.  Levon Borer, AGPCNP-BC  Palliative Medicine Team/[facility]

## 2023-08-03 ENCOUNTER — Inpatient Hospital Stay: Attending: Hematology and Oncology

## 2023-08-03 ENCOUNTER — Other Ambulatory Visit: Payer: Self-pay | Admitting: Hematology and Oncology

## 2023-08-03 ENCOUNTER — Inpatient Hospital Stay

## 2023-08-03 ENCOUNTER — Inpatient Hospital Stay: Attending: Hematology and Oncology | Admitting: Dietician

## 2023-08-03 ENCOUNTER — Telehealth: Payer: Self-pay | Admitting: Dietician

## 2023-08-03 ENCOUNTER — Inpatient Hospital Stay (HOSPITAL_BASED_OUTPATIENT_CLINIC_OR_DEPARTMENT_OTHER): Attending: Hematology and Oncology | Admitting: Hematology and Oncology

## 2023-08-03 VITALS — BP 138/84 | HR 105 | Temp 97.3°F | Resp 16 | Wt 136.8 lb

## 2023-08-03 DIAGNOSIS — D72829 Elevated white blood cell count, unspecified: Secondary | ICD-10-CM | POA: Diagnosis not present

## 2023-08-03 DIAGNOSIS — D6481 Anemia due to antineoplastic chemotherapy: Secondary | ICD-10-CM | POA: Insufficient documentation

## 2023-08-03 DIAGNOSIS — C3412 Malignant neoplasm of upper lobe, left bronchus or lung: Secondary | ICD-10-CM | POA: Diagnosis present

## 2023-08-03 DIAGNOSIS — Z95828 Presence of other vascular implants and grafts: Secondary | ICD-10-CM

## 2023-08-03 DIAGNOSIS — C61 Malignant neoplasm of prostate: Secondary | ICD-10-CM | POA: Insufficient documentation

## 2023-08-03 DIAGNOSIS — Z8673 Personal history of transient ischemic attack (TIA), and cerebral infarction without residual deficits: Secondary | ICD-10-CM | POA: Diagnosis not present

## 2023-08-03 DIAGNOSIS — G893 Neoplasm related pain (acute) (chronic): Secondary | ICD-10-CM | POA: Diagnosis not present

## 2023-08-03 DIAGNOSIS — D649 Anemia, unspecified: Secondary | ICD-10-CM

## 2023-08-03 DIAGNOSIS — I10 Essential (primary) hypertension: Secondary | ICD-10-CM | POA: Diagnosis not present

## 2023-08-03 DIAGNOSIS — Z79891 Long term (current) use of opiate analgesic: Secondary | ICD-10-CM | POA: Insufficient documentation

## 2023-08-03 DIAGNOSIS — T451X5D Adverse effect of antineoplastic and immunosuppressive drugs, subsequent encounter: Secondary | ICD-10-CM | POA: Insufficient documentation

## 2023-08-03 DIAGNOSIS — Z923 Personal history of irradiation: Secondary | ICD-10-CM | POA: Insufficient documentation

## 2023-08-03 DIAGNOSIS — C3490 Malignant neoplasm of unspecified part of unspecified bronchus or lung: Secondary | ICD-10-CM

## 2023-08-03 DIAGNOSIS — C7951 Secondary malignant neoplasm of bone: Secondary | ICD-10-CM | POA: Diagnosis not present

## 2023-08-03 DIAGNOSIS — E871 Hypo-osmolality and hyponatremia: Secondary | ICD-10-CM | POA: Insufficient documentation

## 2023-08-03 DIAGNOSIS — M25551 Pain in right hip: Secondary | ICD-10-CM | POA: Diagnosis not present

## 2023-08-03 DIAGNOSIS — D509 Iron deficiency anemia, unspecified: Secondary | ICD-10-CM | POA: Insufficient documentation

## 2023-08-03 DIAGNOSIS — R634 Abnormal weight loss: Secondary | ICD-10-CM

## 2023-08-03 DIAGNOSIS — R000 Tachycardia, unspecified: Secondary | ICD-10-CM | POA: Insufficient documentation

## 2023-08-03 DIAGNOSIS — Z87891 Personal history of nicotine dependence: Secondary | ICD-10-CM | POA: Diagnosis not present

## 2023-08-03 LAB — CMP (CANCER CENTER ONLY)
ALT: 24 U/L (ref 0–44)
AST: 25 U/L (ref 15–41)
Albumin: 3.2 g/dL — ABNORMAL LOW (ref 3.5–5.0)
Alkaline Phosphatase: 67 U/L (ref 38–126)
Anion gap: 4 — ABNORMAL LOW (ref 5–15)
BUN: 16 mg/dL (ref 8–23)
CO2: 28 mmol/L (ref 22–32)
Calcium: 9.2 mg/dL (ref 8.9–10.3)
Chloride: 97 mmol/L — ABNORMAL LOW (ref 98–111)
Creatinine: 0.86 mg/dL (ref 0.61–1.24)
GFR, Estimated: >60 mL/min (ref >60)
Glucose, Bld: 85 mg/dL (ref 70–99)
Potassium: 4.5 mmol/L (ref 3.5–5.1)
Sodium: 129 mmol/L — ABNORMAL LOW (ref 135–145)
Total Bilirubin: 0.3 mg/dL (ref 0.0–1.2)
Total Protein: 7.9 g/dL (ref 6.5–8.1)

## 2023-08-03 LAB — SAMPLE TO BLOOD BANK

## 2023-08-03 LAB — CBC WITH DIFFERENTIAL (CANCER CENTER ONLY)
Abs Immature Granulocytes: 0.35 K/uL — ABNORMAL HIGH (ref 0.00–0.07)
Basophils Absolute: 0.1 K/uL (ref 0.0–0.1)
Basophils Relative: 1 %
Eosinophils Absolute: 1.3 K/uL — ABNORMAL HIGH (ref 0.0–0.5)
Eosinophils Relative: 9 %
HCT: 25.3 % — ABNORMAL LOW (ref 39.0–52.0)
Hemoglobin: 8.1 g/dL — ABNORMAL LOW (ref 13.0–17.0)
Immature Granulocytes: 2 %
Lymphocytes Relative: 8 %
Lymphs Abs: 1.3 K/uL (ref 0.7–4.0)
MCH: 28 pg (ref 26.0–34.0)
MCHC: 32 g/dL (ref 30.0–36.0)
MCV: 87.5 fL (ref 80.0–100.0)
Monocytes Absolute: 2.7 K/uL — ABNORMAL HIGH (ref 0.1–1.0)
Monocytes Relative: 17 %
Neutro Abs: 9.7 K/uL — ABNORMAL HIGH (ref 1.7–7.7)
Neutrophils Relative %: 63 %
Platelet Count: 438 K/uL — ABNORMAL HIGH (ref 150–400)
RBC: 2.89 MIL/uL — ABNORMAL LOW (ref 4.22–5.81)
RDW: 20.3 % — ABNORMAL HIGH (ref 11.5–15.5)
WBC Count: 15.5 K/uL — ABNORMAL HIGH (ref 4.0–10.5)
nRBC: 0 % (ref 0.0–0.2)

## 2023-08-03 MED ORDER — SODIUM CHLORIDE 1 G PO TABS: 1.0000 g | ORAL_TABLET | Freq: Three times a day (TID) | ORAL | 3 refills | Status: DC

## 2023-08-03 MED ORDER — SODIUM CHLORIDE 0.9% FLUSH
10.0000 mL | Freq: Once | INTRAVENOUS | Status: AC
  Administered 2023-08-03: 10 mL

## 2023-08-03 NOTE — Progress Notes (Signed)
 [facility] [phone]   [phone]  PROGRESS NOTE  Patient Care Team: Rolinda Millman, MD as PCP - General (Family Medicine)  Hematological/Oncological History # Metastatic Adenocarcinoma of the Lung  01/07/2020 : CT of the chest with contrast performed which showed interval enlargement of a spiculated nodule in the central left upper lobe measuring 1.2 x 1.0 cm and highly suspicious for primary lung malignancy 02/06/2020 :PET scan performed which showed a hypermetabolic irregular solid 1.3 cm left upper lobe pulmonary nodule compatible with malignancy without any other hypermetabolic lesions 05/04/7975- 04/19/2020:  SBRT to the lung lesion 04/28/2021: CT C/A/P showed multifocal lytic bone metastases are identified. Lesion within the spine of the left scapula and lesions involving bilateral iliac bones and left sacral wing. Establish care with Dr. Federico  05/03/2021: biopsy of right iliac lytic lesion showed metastatic carcinoma, consistent with lung primary.  07/08/2021: Cycle 1 Day 1 of Carbo/Pem/Pem 07/29/2021: Cycle 2 Day 1 of Carbo/Pem/Pem 08/11/2021-08/24/2021: Received palliative radiation to osseous metastasis in the left shoulder. 30 Gy in 10 fractions.  08/19/2021: Cycle 3 Day 1 of Carbo/Pem/Pem 09/09/2021: Cycle 4 Day 1 of Carbo/Pem/Pem 09/23/2021: CT CAP: stable disease 09/30/2021: Cycle 5 Day 1 of Pem/Pem 10/21/2021: Cycle 6 Day 1 of Pembrolizumab . Held pemetrexed  due to anemia. 11/16/2021: Cycle 7 Day 1 of Pem/Pem.   12/16/2021: Cycle 8 Day 1 of Pem/Pem.   01/06/2022: Cycle 9 Day 1 of Pem/Pem.  01/27/2022: Cycle 10 Day 1 of Pem/Pem 02/17/2022: Cycle 11 Day 1 of Pem/Pem 03/10/2022: Cycle 12 Day 1 of Pem/Pem (HELD due to anemia with Hgb of 6.4) 03/17/2022: Cycle 12 Day 1 of Pem/Pem (HELD pemextrexed due to cytopenias/kidney function) 04/10/2022: Cycle 13 Day 1 of Pem/Pem (HELD pemextrexed due to cytopenias/kidney function) 04/28/2022: Cycle 14 Day 1 of Pem/Pem  (HELD pemextrexed due to cytopenias/kidney function) 05/23/2022:  Cycle 15 Day 1 of Pem/Pem (HELD pemextrexed due to cytopenias/kidney function) 06/15/2022: Cycle 16 Day 1 of Pembrolizumab .  07/07/2022: Cycle 17 Day 1 of Pembrolizumab .  07/28/2022: Cycle 18 Day 1 of Pembrolizumab  08/18/2022: Cycle 19 Day 1 of Pembrolizumab  HELD due to hyponatremia of 118, new bone metastases. 08/25/2022: Cycle 19 Day 1 of Pembrolizumab  08/28/2022-09/13/2022: Received palliative radiation to right pelvis with total dose of 30 Gy in 10 Fx.  09/22/2022:  Cycle 20 Day 1 of Pembrolizumab  10/13/2022:  Cycle 21 Day 1 of Pembrolizumab  12/11/2022: Cycle 1 Day 1 of Docetaxel /Ramucircumab.  01/05/2023: Cycle 2 Day 1 of Docetaxel /Ramucircumab.  01/26/2023: Cycle 3 Day 1 of Docetaxel /Ramucircumab 02/16/2023:  Cycle 4 Day 1 of Docetaxel /Ramucircumab 03/09/2023: Cycle 5 Day 1 of Docetaxel /Ramucircumab 03/30/2023:  Cycle 6 Day 1 of Docetaxel /Ramucircumab 04/20/2023-04/23/2023: Admitted for slurred speech and weakness. MRI brain concerning for stroke. CTA chest found progression of irregular consolidation in the left apex.  04/27/2023: Cycle 1, Day 1 of Gemcitabine  05/25/2023: Cycle 2, Day 1 of Gemcitabine  06/22/2023: Cycle 3, Day 1 of Gemcitabine   #Adenocarcinoma of the Prostate, T1cN0M0 08/19/2019-10/07/2019: 70 Gy in 28 fractions of 2.5 Gy.  Radiation to the prostate was under the care of Dr. Donnice Barge  Interval History:  Terry Page. 68 y.o. male with medical history significant for metastatic adenocarcinoma of the lung who presents for a follow up visit. The patient's last visit was on 07/20/2023. In the interim since the last visit, he has been off chemotherapy.   On exam today, Mr. Scoggins reports he has been well overall in interim since our last visit.  He reports that he  has noticed an improvement with the antibiotic therapy and is feeling less sluggish.  He also reports his appetite is improving.  He is disheartened that his  appetite improvement has not coincided with an increase in weight.  He notes that he is not having any cough or shortness of breath.  He denies any lightheadedness or dizziness.  He reports his pain is currently under good control.  He typically takes about 8 tramadol  a day and typically takes 2 at a time.  He notes that nothing else out of the ordinary has occurred in the interim since our last visit.  A full 10 point ROS is otherwise negative.  The bulk of our discussion focused on next steps.  Unfortunately we are out of conventional chemotherapy options in his situation.  We discussed comfort based care and focusing on treating his symptoms only.  He voices understanding of our findings and recommendations moving forward.  He was understanding of the fact that we no longer have treatment options for him.  He was agreeable to supportive care only.  MEDICAL HISTORY:  Past Medical History:  Diagnosis Date   Alcohol abuse    Asthma    Bullous emphysema ([name])    Essential hypertension 04/10/2020   Hemorrhoids    History of radiation therapy 04/08/20-04/19/20   IMRT- Left lung- Dr. Lynwood Nasuti   Incidental lung nodule, greater than or equal to 8mm 10/22/2017   Left upper lobe - discovered on CTA   Prostate cancer ([name])    Spontaneous pneumothorax 10/20/2017   right   Tobacco abuse     SURGICAL HISTORY: Past Surgical History:  Procedure Laterality Date   BACK SURGERY     IR IMAGING GUIDED PORT INSERTION  06/29/2021   PROSTATE BIOPSY      SOCIAL HISTORY: Social History   Socioeconomic History   Marital status: Single    Spouse name: Not on file   Number of children: Not on file   Years of education: Not on file   Highest education level: Not on file  Occupational History   Occupation: retired  Tobacco Use   Smoking status: Former    Current packs/day: 0.00    Types: Cigarettes    Quit date: 11/22/2019    Years since quitting: 3.6   Smokeless tobacco: Never   Tobacco  comments:    Patient reports quit 3 years ago. 06/25/20. [facility]  Vaping Use   Vaping status: Never Used  Substance and Sexual Activity   Alcohol use: Yes    Alcohol/week: 12.0 standard drinks of alcohol    Types: 12 Cans of beer per week    Comment: daily sometimes   Drug use: No   Sexual activity: Not Currently  Other Topics Concern   Not on file  Social History Narrative   Not on file   Social Drivers of Health   Financial Resource Strain: Not on file  Food Insecurity: No Food Insecurity (04/22/2023)   Hunger Vital Sign    Worried About Running Out of Food in the Last Year: Never true    Ran Out of Food in the Last Year: Never true  Transportation Needs: No Transportation Needs (04/22/2023)   PRAPARE - Administrator, Civil Service (Medical): No    Lack of Transportation (Non-Medical): No  Recent Concern: Transportation Needs - Unmet Transportation Needs (04/21/2023)   PRAPARE - Administrator, Civil Service (Medical): Yes    Lack of Transportation (Non-Medical): No  Physical Activity: Not on file  Stress: Not on file  Social Connections: Moderately Integrated (04/21/2023)   Social Connection and Isolation Panel    Frequency of Communication with Friends and Family: More than three times a week    Frequency of Social Gatherings with Friends and Family: More than three times a week    Attends Religious Services: 1 to 4 times per year    Active Member of [employer] or Organizations: No    Attends Banker Meetings: Never    Marital Status: Married  Catering manager Violence: Not At Risk (04/22/2023)   Humiliation, Afraid, Rape, and Kick questionnaire    Fear of Current or Ex-Partner: No    Emotionally Abused: No    Physically Abused: No    Sexually Abused: No    FAMILY HISTORY: Family History  Problem Relation Age of Onset   Cancer Cousin        maternal cousin   Cancer Cousin        paternal cousin   Cancer Cousin    Colon polyps Neg Hx     Pancreatic disease Neg Hx    Pancreatic cancer Neg Hx    Breast cancer Neg Hx    Colon cancer Neg Hx     ALLERGIES:  is allergic to morphine  and oxycodone .  MEDICATIONS:  Current Outpatient Medications  Medication Sig Dispense Refill   acetaminophen  (TYLENOL ) 325 MG tablet Take 2 tablets (650 mg total) by mouth every 6 (six) hours as needed for mild pain or headache (fever >/= 101).     albuterol  (VENTOLIN  HFA) 108 (90 Base) MCG/ACT inhaler Inhale 2 puffs into the lungs every 6 (six) hours as needed for wheezing or shortness of breath.     amoxicillin -clavulanate (AUGMENTIN ) 875-125 MG tablet Take 1 tablet by mouth 2 (two) times daily. 10 tablet 0   atorvastatin  (LIPITOR) 40 MG tablet Take 1 tablet (40 mg total) by mouth daily. 30 tablet 1   cyanocobalamin  (VITAMIN B12) 1000 MCG tablet Take 1 tablet (1,000 mcg total) by mouth daily. (Patient taking differently: Take 1,000 mcg by mouth as needed (When directed by physican).) 90 tablet 1   dronabinol  (MARINOL ) 5 MG capsule Take 1 capsule (5 mg total) by mouth 2 (two) times daily before lunch and supper. 60 capsule 0   ferrous sulfate  325 (65 FE) MG EC tablet Take 1 tablet (325 mg total) by mouth daily with breakfast. 30 tablet 3   folic acid  (FOLVITE ) 1 MG tablet Take 1 tablet (1 mg total) by mouth daily. 90 tablet 3   hydrOXYzine  (ATARAX ) 25 MG tablet Take 1 tablet (25 mg total) by mouth at bedtime as needed for itching. 30 tablet 11   ipratropium-albuterol  (DUONEB) 0.5-2.5 (3) MG/3ML SOLN Take 3 mLs by nebulization every 6 (six) hours as needed. (Patient taking differently: Take 3 mLs by nebulization every 6 (six) hours as needed (shortness of breathing or wheezing).) 360 mL 0   lidocaine  (LIDODERM ) 5 % Place 1 patch onto the skin daily. Remove & Discard patch within 12 hours or as directed by MD. Apply to low back 30 patch 0   lidocaine -prilocaine  (EMLA ) cream Apply 1 Application topically as needed. 30 g 0   mirtazapine  (REMERON ) 7.5 MG  tablet Take 1 tablet (7.5 mg total) by mouth at bedtime. 30 tablet 0   polyethylene glycol (MIRALAX ) 17 g packet Take 17 g by mouth daily. 30 each 2   potassium chloride  SA (KLOR-CON  M) 20 MEQ tablet  TAKE 1 TABLET(20 MEQ) BY MOUTH DAILY 90 tablet 0   SENNA S 8.6-50 MG tablet Take 2 tablets by mouth at bedtime as needed for mild constipation or moderate constipation.     senna-docusate (STIMULANT LAXATIVE) 8.6-50 MG tablet Take 2 tablets by mouth 2 (two) times daily. May add additional tablet as needed 150 tablet 3   sodium chloride  1 g tablet Take 1 tablet (1 g total) by mouth 3 (three) times daily with meals. 90 tablet 3   thiamine  100 MG tablet Take 1 tablet (100 mg total) by mouth daily.     traMADol  (ULTRAM ) 50 MG tablet Take 1-2 tablets (50-100 mg total) by mouth every 6 (six) hours as needed. 120 tablet 0   No current facility-administered medications for this visit.    REVIEW OF SYSTEMS:   Constitutional: ( - ) fevers, ( - )  chills , ( - ) night sweats Eyes: ( - ) blurriness of vision, ( - ) double vision, ( - ) watery eyes Ears, nose, mouth, throat, and face: ( - ) mucositis, ( - ) sore throat Respiratory: ( - ) cough, ( - ) dyspnea, ( - ) wheezes Cardiovascular: ( - ) palpitation, ( - ) chest discomfort, ( - ) lower extremity swelling Gastrointestinal:  ( - ) nausea, ( - ) heartburn, ( - ) change in bowel habits Skin: ( - ) abnormal skin rashes Lymphatics: ( - ) new lymphadenopathy, ( - ) easy bruising Neurological: ( - ) numbness, ( - ) tingling, ( - ) new weaknesses Behavioral/Psych: ( - ) mood change, ( - ) new changes  All other systems were reviewed with the patient and are negative.  PHYSICAL EXAMINATION: ECOG PERFORMANCE STATUS: 1 - Symptomatic but completely ambulatory  Vitals:   08/03/23 0943  BP: 138/84  Pulse: (!) 105  Resp: 16  Temp: (!) 97.3 F (36.3 C)  SpO2: 95%    Filed Weights   08/03/23 0943  Weight: 136 lb 12.8 oz (62.1 kg)     GENERAL:  Well-appearing elderly African-American male, alert, no distress and comfortable SKIN: skin color, texture, turgor are normal, no rashes or significant lesions EYES: conjunctiva are pink and non-injected, sclera clear LUNGS: clear to auscultation and percussion with normal breathing effort HEART: regular rate & rhythm and no murmurs and no lower extremity edema Musculoskeletal: no cyanosis of digits and no clubbing  PSYCH: alert & oriented x 3, fluent speech NEURO: no focal motor/sensory deficits  LABORATORY DATA:  I have reviewed the data as listed    Latest Ref Rng & Units 08/03/2023    9:21 AM 07/27/2023    8:19 AM 07/20/2023    8:34 AM  CBC  WBC 4.0 - 10.5 K/uL 15.5  13.8  13.3   Hemoglobin 13.0 - 17.0 g/dL 8.1  8.7  8.7   Hematocrit 39.0 - 52.0 % 25.3  26.0  26.8   Platelets 150 - 400 K/uL 438  762  767        Latest Ref Rng & Units 07/27/2023    8:19 AM 07/20/2023    8:34 AM 07/05/2023    8:51 AM  CMP  Glucose 70 - 99 mg/dL 92  885  89   BUN 8 - 23 mg/dL 13  10  11    Creatinine 0.61 - 1.24 mg/dL 9.17  9.09  9.24   Sodium 135 - 145 mmol/L 134  131  134   Potassium 3.5 - 5.1 mmol/L 3.6  3.7  3.9   Chloride 98 - 111 mmol/L 99  96  102   CO2 22 - 32 mmol/L 29  27  26    Calcium  8.9 - 10.3 mg/dL 9.3  8.9  9.1   Total Protein 6.5 - 8.1 g/dL 7.6  7.1  6.5   Total Bilirubin 0.0 - 1.2 mg/dL 0.2  0.3  0.2   Alkaline Phos 38 - 126 U/L 77  74  56   AST 15 - 41 U/L 39  37  20   ALT 0 - 44 U/L 30  26  16      Lab Results  Component Value Date   MPROTEIN Not Observed 04/28/2021   Lab Results  Component Value Date   KPAFRELGTCHN 33.6 (H) 04/28/2021   LAMBDASER 22.0 04/28/2021   KAPLAMBRATIO 1.53 04/28/2021    RADIOGRAPHIC STUDIES: CT CHEST ABDOMEN PELVIS W CONTRAST Result Date: 07/17/2023 CLINICAL DATA:  Metastatic non-small cell lung cancer restaging, additional history of metastatic prostate cancer * Tracking Code: BO * EXAM: CT CHEST, ABDOMEN, AND PELVIS WITH CONTRAST  TECHNIQUE: Multidetector CT imaging of the chest, abdomen and pelvis was performed following the standard protocol during bolus administration of intravenous contrast. RADIATION DOSE REDUCTION: This exam was performed according to the departmental dose-optimization program which includes automated exposure control, adjustment of the mA and/or kV according to patient size and/or use of iterative reconstruction technique. CONTRAST:  OMNIPAQUE  IOHEXOL  300 MG/ML  SOLN COMPARISON:  CT chest angiogram, 04/20/2023, CT chest abdomen pelvis, 02/15/2023 FINDINGS: CT CHEST FINDINGS Cardiovascular: Right chest port catheter. Aortic atherosclerosis. Normal heart size. Three-vessel coronary artery calcifications no pericardial effusion. Mediastinum/Nodes: No enlarged mediastinal, hilar, or axillary lymph nodes. Thyroid  gland, trachea, and esophagus demonstrate no significant findings. Lungs/Pleura: Severe emphysema, bullous in the left upper lobe. When compared to most recent imaging of the chest dated 04/20/2023, no significant change in a treated mass of the left upper lobe measuring 2.8 x 1.8 cm (series 4, image 31). There are however numerous new enlarged bilateral pulmonary nodules, for example a 1.1 x 1.0 cm nodule of the dependent left lower lobe, previously no greater than 0.4 cm (series 4, image 76). Example new nodule in the peripheral right lower lobe measures 0.4 cm (series 4, image 111). New irregular consolidative opacity in the dependent right lower lobe measuring 2.9 x 2.3 cm (series 4, image 79). Trace pleural effusions, unchanged. Musculoskeletal: No chest wall abnormality. No acute osseous findings. No significant change in a sclerotic osseous metastasis and pathologic fracture of the left scapular body (series 4, image 9). Disc degenerative disease and bridging osteophytosis throughout the thoracic spine in keeping with DISH. CT ABDOMEN PELVIS FINDINGS Hepatobiliary: No solid liver abnormality is seen.  Benign simple cysts requiring no further follow-up or characterization as well as tiny low-attenuation lesions too small to confidently characterize although most likely tiny cysts. Contracted gallbladder. No gallstones, gallbladder wall thickening, or biliary dilatation. Pancreas: Unremarkable. No pancreatic ductal dilatation or surrounding inflammatory changes. Spleen: Normal in size without significant abnormality. Adrenals/Urinary Tract: Adrenal glands are unremarkable. Unchanged intermediate attenuation lesion of the peripheral superior pole of the right kidney measuring 1.3 x 1.2 cm (series 2, image 60). Additional simple fluid attenuation renal cortical cysts, benign, requiring no further follow-up or characterization. Kidneys are otherwise normal, without renal calculi, solid lesion, or hydronephrosis. Bladder is unremarkable. Stomach/Bowel: Stomach is within normal limits. Appendix appears normal. No evidence of bowel wall thickening, distention, or inflammatory changes. Vascular/Lymphatic: Severe aortic atherosclerosis. No  enlarged abdominal or pelvic lymph nodes. Reproductive: Mild prostatomegaly. Other: No abdominal wall hernia or abnormality. No ascites. Musculoskeletal: No acute osseous findings. Some interval sclerotic healing of multiple osseous metastases throughout the pelvis, including nonacute pathologic fractures of the left sacral ala (series 2, image 90). IMPRESSION: 1. When compared to most recent imaging of the chest dated 04/20/2023, no significant change in a treated mass of the left upper lobe. There are however numerous new and enlarged bilateral pulmonary nodules, consistent with worsened pulmonary metastatic disease. 2. New irregular consolidative opacity in the dependent right lower lobe measuring 2.9 x 2.3 cm, most likely related to aspiration given appearance and distribution. 3. Unchanged appearance of a left scapular body metastasis with associated nonacute pathologic fracture. 4.  When compared to most recent imaging of the abdomen and pelvis dated 02/15/2023, some interval sclerotic healing of multiple osseous metastases throughout the pelvis, including nonacute pathologic fractures of the left sacral ala. 5. Unchanged intermediate attenuation lesion of the peripheral superior pole of the right kidney measuring 1.3 x 1.2 cm. This remains suspicious for a small incidentally present renal cell carcinoma. Continued attention on follow-up in the setting of other known metastatic malignancy. 6. Severe emphysema. 7. Coronary artery disease. Aortic Atherosclerosis (ICD10-I70.0) and Emphysema (ICD10-J43.9). Electronically Signed   By: Marolyn JONETTA Jaksch M.D.   On: 07/17/2023 22:09      ASSESSMENT & PLAN Terry Page. Is a 68 y.o.  male with medical history significant for metastatic adenocarcinoma of the lung who presents for a follow up visit.  # Metastatic Adenocarcinoma of the Lung  -- NGS testing from this patient shows no evidence of targetable mutation. --MRI brain shows no evidence of intracranial metastases --Started carboplatin , pembrolizumab , and pemetrexed  as treatment for his cancer on 07/08/2021. --Received palliative radiation to osseous metastasis of the left shoulder from 08/11/2021-08/24/2021. Planned dose is 30 Gy in 10 fx.  --Discontinued Pemetrexed  on 03/17/2022 due to persistent anemia and kidney dysfunction. --Restaging CT CAP from 08/09/2022 shows interval enlargement of multiple mixed lytic and sclerotic bone metastases involving the right pelvis. Other bone metastases including of the left scapula, sacrum, and left hemipelvis are unchanged. Unchanged treated central LUL mass. Recommend to continue on pembrolizumab  therapy and arrange for palliative radiation to enlarging bone metastases.  --CTA chest from 04/20/2023 showed progression of disease to the consolidation in the left apex. Recommended to change therapy  to single agent Gemcitabine  3 weeks on, 1 week off.   --Started Cycle 1, Day 1 of Gemcitabine  on 04/27/2023 Plan: --Gemcitabine  HELD due to progression.  --labs from today were reviewed. Labs show white blood cell count 15.5, hemoglobin 8.1, MCV 87.5, platelets 438. Creatinine and LFTs normal.  --patient progressed on 3rd line therapy, no targetable mutations. Options are best supportive care or clinical trial. No active trials for his situation locally.  --RTC for labs and visit in 4 weeks.   # Tachycardia # Sweats # Concern for Aspiration Pneumonia -- at last visit provided patient with 1 L of fluids today due to his tachycardia and sweats. -- Prescribed Augmentin  for a 5-day course for the treatment of a possible aspiration pneumonia  #Leukocytosis-neutrophil predominant: --Patient denies any infectious symptoms, elevation is likely secondary to growth factor --Held GCSF injection with Cycle 4 of Ram/Docetaxel . Continue to hold injection for future cycles.   #Left lower eyelid stye--resolved: --Applying warm compresses, recommend to continue --Completed 7 day course of doxycycline  100 mg BID.   #Left sided low back pain with  sciatica: --History of lumbar spondylosis and moderate to severe facet arthrosis without spinal stenosis and mild right neural foraminal stenosis at L4-5 seen on MRI lumbar 04/24/2020.   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  --Pain is slightly improved but still present.  --Continue to take tramadol  q 6 hours for pain control.   #Hyponatremia-improved --Chronic in nature --Sodium level dropped from 127 on 08/09/2022 to 118.  Today sodium is stable  --Etiologies including SIADH, immunotherapy induced, too much water intake.  --Patient is asymptomatic without nausea/vomiting, headaches, confusion, seizures, etc --Currently on salt  tablets 1gm three times daily.  --Strict ED precautions given for any new symptoms as discussed above.   # Iron Deficiency Anemia #Chemotherapy induced anemia --Currently on ferrous sulfate  325 mg PO daily --No evidence of iron/b12/folate deficiency per labs from 04/22/2023.   #Pain Control --stopped oxycodone  due to itching --currently following with Palliative care.  --pain well controlled with tramadol  as needed. Continue to take tramdol 50-100 mg q 6 hours as needed.  He has been taking about 8/day. --Continue senna docusate and miralax  for constipation prophylaxis --Continue to monitor  #Supportive Care -- chemotherapy education complete -- port placed -- zofran  8mg  q8H PRN and compazine  10mg  PO q6H for nausea -- EMLA  cream for port -- Patient referred to palliative care for pain control. Pain control as above.    No orders of the defined types were placed in this encounter.   All questions were answered. The patient knows to call the clinic with any problems, questions or concerns.  I have spent a total of 30 minutes minutes of face-to-face and non-face-to-face time, preparing to see the patient, performing a medically appropriate examination, counseling and educating the patient, referring and communicating with other health care professionals, documenting clinical information in the electronic health record, and care coordination.   Norleen IVAR Kidney, MD Department of Hematology/Oncology [facility] Phone: [phone] Pager: [phone] Email: [email or web address]  08/03/2023 10:21 AM

## 2023-08-03 NOTE — Telephone Encounter (Signed)
 Brief Nutrition Note:  Planned to see patient in infusion for nutrition follow-up. Received message from RN Blanchard Arabia) reporting patient will no longer be receiving treatments. Patient was okay to receive telephone call from RD per [facility].   Spoke with patient via telephone. He is doing ok at time of call. States he is staying positive. He is asking which medications to continue taking. Patient has placed call to Dr. Lafonda office. Currently awaiting return call. Patient shares his appreciation for care received at [facility].   Intervention: Encouraged continuing to supplement intake with Ensure Complete as desired - will mail coupons Pt awaiting return call regarding medications  Support and encouragement  No follow-up scheduled - encouraged to contact as needed.

## 2023-08-06 ENCOUNTER — Other Ambulatory Visit: Payer: Self-pay | Admitting: *Deleted

## 2023-08-06 MED ORDER — SODIUM CHLORIDE 1 G PO TABS: 1.0000 g | ORAL_TABLET | Freq: Three times a day (TID) | ORAL | 3 refills | Status: DC

## 2023-08-07 ENCOUNTER — Ambulatory Visit: Admitting: Neurology

## 2023-08-14 ENCOUNTER — Other Ambulatory Visit: Payer: Self-pay

## 2023-08-14 DIAGNOSIS — C3490 Malignant neoplasm of unspecified part of unspecified bronchus or lung: Secondary | ICD-10-CM

## 2023-08-14 DIAGNOSIS — Z515 Encounter for palliative care: Secondary | ICD-10-CM

## 2023-08-14 DIAGNOSIS — G893 Neoplasm related pain (acute) (chronic): Secondary | ICD-10-CM

## 2023-08-14 MED ORDER — TRAMADOL HCL 50 MG PO TABS: 50.0000 mg | ORAL_TABLET | Freq: Four times a day (QID) | ORAL | 0 refills | Status: DC | PRN

## 2023-08-17 ENCOUNTER — Ambulatory Visit: Admitting: Hematology and Oncology

## 2023-08-17 ENCOUNTER — Other Ambulatory Visit

## 2023-08-17 ENCOUNTER — Ambulatory Visit

## 2023-08-24 ENCOUNTER — Other Ambulatory Visit

## 2023-08-24 ENCOUNTER — Ambulatory Visit

## 2023-08-27 ENCOUNTER — Inpatient Hospital Stay (HOSPITAL_BASED_OUTPATIENT_CLINIC_OR_DEPARTMENT_OTHER): Admitting: Nurse Practitioner

## 2023-08-27 DIAGNOSIS — G893 Neoplasm related pain (acute) (chronic): Secondary | ICD-10-CM

## 2023-08-27 DIAGNOSIS — R530 Neoplastic (malignant) related fatigue: Secondary | ICD-10-CM

## 2023-08-27 DIAGNOSIS — C3412 Malignant neoplasm of upper lobe, left bronchus or lung: Secondary | ICD-10-CM

## 2023-08-27 DIAGNOSIS — K5903 Drug induced constipation: Secondary | ICD-10-CM | POA: Diagnosis not present

## 2023-08-27 DIAGNOSIS — Z515 Encounter for palliative care: Secondary | ICD-10-CM | POA: Diagnosis not present

## 2023-08-27 MED ORDER — SENNOSIDES-DOCUSATE SODIUM 8.6-50 MG PO TABS: 2.0000 | ORAL_TABLET | Freq: Two times a day (BID) | ORAL | 3 refills | Status: AC

## 2023-08-27 MED ORDER — HYDROXYZINE HCL 25 MG PO TABS: 25.0000 mg | ORAL_TABLET | Freq: Every evening | ORAL | 11 refills | Status: AC | PRN

## 2023-08-28 ENCOUNTER — Encounter: Payer: Self-pay | Admitting: Hematology and Oncology

## 2023-08-28 ENCOUNTER — Encounter: Payer: Self-pay | Admitting: Nurse Practitioner

## 2023-08-28 NOTE — Progress Notes (Addendum)
 Palliative Medicine [facility]  [phone] [phone]   Name: Terry Page. Date: 08/28/2023 MRN: [id number]  DOB: 03/03/1955  Patient Care Team: Rolinda Millman, MD as PCP - General (Family Medicine)   I connected with Terry Page. on 08/27/23 at  4:15 PM EDT by Telephone and verified that I am speaking with the correct person using two identifiers.   I discussed the limitations, risks, security and privacy concerns of performing an evaluation and management service by telemedicine and the availability of in-person appointments. I also discussed with the patient that there may be a patient responsible charge related to this service. The patient expressed understanding and agreed to proceed.   Other persons participating in the visit and their role in the encounter: N/a   Patient's location: Home   Provider's location: [facility]   INTERVAL HISTORY: Terry Page. is a 68 y.o. 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 He is currently undergoing chemotherapy. Palliative ask to see for symptom management.   SOCIAL HISTORY:     reports that he quit smoking about 3 years ago. His smoking use included cigarettes. He has never used smokeless tobacco. He reports current alcohol use of about 12.0 standard drinks of alcohol per week. He reports that he does not use drugs.  ADVANCE DIRECTIVES:  None on file   CODE STATUS: Full code  PAST MEDICAL HISTORY: Past Medical History:  Diagnosis Date   Alcohol abuse    Asthma    Bullous emphysema ([name])    Essential hypertension 04/10/2020   Hemorrhoids    History of radiation therapy 04/08/20-04/19/20   IMRT- Left lung- Dr. Lynwood Nasuti   Incidental lung nodule, greater than or equal to 8mm 10/22/2017   Left upper lobe - discovered on CTA    Prostate cancer ([name])    Spontaneous pneumothorax 10/20/2017   right   Tobacco abuse     ALLERGIES:  is allergic to morphine  and oxycodone .  MEDICATIONS:  Current Outpatient Medications  Medication Sig Dispense Refill   acetaminophen  (TYLENOL ) 325 MG tablet Take 2 tablets (650 mg total) by mouth every 6 (six) hours as needed for mild pain or headache (fever >/= 101).     albuterol  (VENTOLIN  HFA) 108 (90 Base) MCG/ACT inhaler Inhale 2 puffs into the lungs every 6 (six) hours as needed for wheezing or shortness of breath.     amoxicillin -clavulanate (AUGMENTIN ) 875-125 MG tablet Take 1 tablet by mouth 2 (two) times daily. 10 tablet 0   atorvastatin  (LIPITOR) 40 MG tablet Take 1 tablet (40 mg total) by mouth daily. 30 tablet 1   cyanocobalamin  (VITAMIN B12) 1000 MCG tablet Take 1 tablet (1,000 mcg total) by mouth daily. (Patient taking differently: Take 1,000 mcg by mouth as needed (When directed by physican).) 90 tablet 1   dronabinol  (MARINOL ) 5 MG capsule Take 1 capsule (5 mg total) by mouth 2 (two) times daily before lunch and supper. 60 capsule 0   ferrous sulfate  325 (65 FE) MG EC tablet Take 1 tablet (325 mg total) by mouth daily with breakfast. 30 tablet 3   folic acid  (FOLVITE ) 1 MG tablet Take 1 tablet (1 mg total) by mouth daily. 90 tablet 3   hydrOXYzine  (ATARAX ) 25 MG tablet Take 1 tablet (25 mg total) by mouth at bedtime as needed for itching. 30  tablet 11   ipratropium-albuterol  (DUONEB) 0.5-2.5 (3) MG/3ML SOLN Take 3 mLs by nebulization every 6 (six) hours as needed. (Patient taking differently: Take 3 mLs by nebulization every 6 (six) hours as needed (shortness of breathing or wheezing).) 360 mL 0   lidocaine  (LIDODERM ) 5 % Place 1 patch onto the skin daily. Remove & Discard patch within 12 hours or as directed by MD. Apply to low back 30 patch 0   lidocaine -prilocaine  (EMLA ) cream Apply 1 Application topically as needed. 30 g 0   mirtazapine  (REMERON ) 7.5 MG tablet Take 1  tablet (7.5 mg total) by mouth at bedtime. 30 tablet 0   polyethylene glycol (MIRALAX ) 17 g packet Take 17 g by mouth daily. 30 each 2   potassium chloride  SA (KLOR-CON  M) 20 MEQ tablet TAKE 1 TABLET(20 MEQ) BY MOUTH DAILY 90 tablet 0   senna-docusate (STIMULANT LAXATIVE) 8.6-50 MG tablet Take 2 tablets by mouth 2 (two) times daily. May add additional tablet as needed 150 tablet 3   sodium chloride  1 g tablet Take 1 tablet (1 g total) by mouth 3 (three) times daily with meals. 90 tablet 3   thiamine  100 MG tablet Take 1 tablet (100 mg total) by mouth daily.     traMADol  (ULTRAM ) 50 MG tablet Take 1-2 tablets (50-100 mg total) by mouth every 6 (six) hours as needed. 120 tablet 0   No current facility-administered medications for this visit.    VITAL SIGNS: There were no vitals taken for this visit. There were no vitals filed for this visit.    Estimated body mass index is 20.2 kg/m as calculated from the following:   Height as of 05/11/23: 5' 9 (1.753 m).   Weight as of 08/03/23: 136 lb 12.8 oz (62.1 kg).   PERFORMANCE STATUS (ECOG) : 1 - Symptomatic but completely ambulatory  IMPRESSION: Discussed the use of AI scribe software for clinical note transcription with the patient, who gave verbal consent to proceed.  History of Present Illness Terry Page. is a 68 year old male  with metastatic lung cancer with bone involvement who I connected with by phone for symptom management follow-up. No acute distress. States he is doing well overall. Denies nausea, vomiting, constipation, or diarrhea.   Bowel movements are better controlled with use of Senna 2 tablets twice daily with a daily dose of Miralax . Occasional fatigue however remains as active as possible.   Terry Page reports his pain is well-controlled with use of tramadol  50-100 mg as needed in addition to lidocaine  patches.  No adjustments to current regimen.  All questions answered and support provided.   I discussed the  importance of continued conversation with family and their medical providers regarding overall plan of care and treatment options, ensuring decisions are within the context of the patients values and GOCs. Assessment & Plan Cancer related pain/Back pain Reports pain is well controlled. No adjustments at this time.  - Tramadol  50-100mg  every 6 hours as needed -Lidocaine  patch to back as needed.  Constipation Constipation improved. Currently using Senna for bowel management. No Miralax  at home. - Provide samples of Miralax  powder. - Instruct to take Miralax  once daily, with the option to increase to twice daily if needed.  I will plan to see patient back in 3-4 weeks. Sooner if needed.   Patient expressed understanding and was in agreement with this plan. He also understands that He can call the clinic at any time with any questions, concerns, or complaints.  Any controlled substances utilized were prescribed in the context of palliative care. PDMP has been reviewed.   I provided 25 minutes of non face-to-face telephone visit time during this encounter, and > 50% was spent counseling as documented under my assessment & plan. Visit consisted of counseling and education dealing with the complex and emotionally intense issues of symptom management and palliative care in the setting of serious and potentially life-threatening illness.  Levon Borer, AGPCNP-BC  Palliative Medicine Team/[facility]

## 2023-08-31 ENCOUNTER — Inpatient Hospital Stay (HOSPITAL_BASED_OUTPATIENT_CLINIC_OR_DEPARTMENT_OTHER): Admitting: Hematology and Oncology

## 2023-08-31 ENCOUNTER — Ambulatory Visit

## 2023-08-31 ENCOUNTER — Other Ambulatory Visit

## 2023-08-31 ENCOUNTER — Ambulatory Visit: Admitting: Hematology and Oncology

## 2023-08-31 ENCOUNTER — Other Ambulatory Visit: Payer: Self-pay | Admitting: Hematology and Oncology

## 2023-08-31 ENCOUNTER — Telehealth: Payer: Self-pay | Admitting: Hematology and Oncology

## 2023-08-31 ENCOUNTER — Telehealth: Payer: Self-pay | Admitting: *Deleted

## 2023-08-31 ENCOUNTER — Inpatient Hospital Stay

## 2023-08-31 DIAGNOSIS — C3412 Malignant neoplasm of upper lobe, left bronchus or lung: Secondary | ICD-10-CM

## 2023-08-31 DIAGNOSIS — D649 Anemia, unspecified: Secondary | ICD-10-CM

## 2023-08-31 NOTE — Progress Notes (Signed)
 Rescheduled

## 2023-08-31 NOTE — Telephone Encounter (Signed)
 TCT patient regarding his appt for today. Spoke with him. He was thinking he didn't need to come as he is not on treatment anymore. He was seen in Palliative Care earlier in the [facility]. He is agreeable to being rescheduled. Scheduling message sent

## 2023-09-03 ENCOUNTER — Encounter: Payer: Self-pay | Admitting: Hematology and Oncology

## 2023-09-04 ENCOUNTER — Telehealth: Payer: Self-pay | Admitting: *Deleted

## 2023-09-04 ENCOUNTER — Other Ambulatory Visit: Payer: Self-pay | Admitting: Physician Assistant

## 2023-09-04 DIAGNOSIS — C3412 Malignant neoplasm of upper lobe, left bronchus or lung: Secondary | ICD-10-CM

## 2023-09-04 NOTE — Telephone Encounter (Signed)
 Received voice mail from [facility] w/[facility], works with Dr. Rolinda, Mr. Georgian PCP.  Her CB# [phone] - secure voice mail.   Per Geni: Mr. Etchison shared with Dr. Rolinda that he wanted to get a second opinion for lung cancer as he is interested in any treatment that will prolong his life. Dr. Rolinda would like Dr. Lafonda recommendations.  Dr. Federico informed of Dr. Zachery request. Per Dr. Federico, 2nd opinion referral can be made at this [facility] to Dr. Sherrod or to [facility] or [facility] services, depending on if patient has preference of location. Mr. Odonoghue has appt here at [facility] tomorrow 9/3 and this can be discussed with him at that time.  Contacted Erica at number provided and left message with all information from Dr. Federico.

## 2023-09-05 ENCOUNTER — Inpatient Hospital Stay

## 2023-09-05 ENCOUNTER — Inpatient Hospital Stay: Attending: Hematology and Oncology

## 2023-09-05 ENCOUNTER — Inpatient Hospital Stay: Attending: Hematology and Oncology | Admitting: Physician Assistant

## 2023-09-05 VITALS — BP 138/91 | HR 98 | Temp 97.4°F | Resp 18 | Ht 69.0 in | Wt 131.7 lb

## 2023-09-05 DIAGNOSIS — D6481 Anemia due to antineoplastic chemotherapy: Secondary | ICD-10-CM | POA: Insufficient documentation

## 2023-09-05 DIAGNOSIS — C7951 Secondary malignant neoplasm of bone: Secondary | ICD-10-CM | POA: Insufficient documentation

## 2023-09-05 DIAGNOSIS — E871 Hypo-osmolality and hyponatremia: Secondary | ICD-10-CM | POA: Insufficient documentation

## 2023-09-05 DIAGNOSIS — Z79891 Long term (current) use of opiate analgesic: Secondary | ICD-10-CM | POA: Insufficient documentation

## 2023-09-05 DIAGNOSIS — T451X5D Adverse effect of antineoplastic and immunosuppressive drugs, subsequent encounter: Secondary | ICD-10-CM | POA: Diagnosis not present

## 2023-09-05 DIAGNOSIS — C3490 Malignant neoplasm of unspecified part of unspecified bronchus or lung: Secondary | ICD-10-CM | POA: Diagnosis not present

## 2023-09-05 DIAGNOSIS — I10 Essential (primary) hypertension: Secondary | ICD-10-CM | POA: Insufficient documentation

## 2023-09-05 DIAGNOSIS — C61 Malignant neoplasm of prostate: Secondary | ICD-10-CM | POA: Insufficient documentation

## 2023-09-05 DIAGNOSIS — G893 Neoplasm related pain (acute) (chronic): Secondary | ICD-10-CM | POA: Diagnosis not present

## 2023-09-05 DIAGNOSIS — Z515 Encounter for palliative care: Secondary | ICD-10-CM | POA: Diagnosis not present

## 2023-09-05 DIAGNOSIS — Z923 Personal history of irradiation: Secondary | ICD-10-CM | POA: Insufficient documentation

## 2023-09-05 DIAGNOSIS — Z87891 Personal history of nicotine dependence: Secondary | ICD-10-CM | POA: Insufficient documentation

## 2023-09-05 DIAGNOSIS — C3412 Malignant neoplasm of upper lobe, left bronchus or lung: Secondary | ICD-10-CM | POA: Diagnosis present

## 2023-09-05 DIAGNOSIS — D509 Iron deficiency anemia, unspecified: Secondary | ICD-10-CM | POA: Diagnosis not present

## 2023-09-05 LAB — CBC WITH DIFFERENTIAL (CANCER CENTER ONLY)
Abs Immature Granulocytes: 0.05 K/uL (ref 0.00–0.07)
Basophils Absolute: 0.1 K/uL (ref 0.0–0.1)
Basophils Relative: 0 %
Eosinophils Absolute: 0.5 K/uL (ref 0.0–0.5)
Eosinophils Relative: 4 %
HCT: 32.8 % — ABNORMAL LOW (ref 39.0–52.0)
Hemoglobin: 10.6 g/dL — ABNORMAL LOW (ref 13.0–17.0)
Immature Granulocytes: 0 %
Lymphocytes Relative: 10 %
Lymphs Abs: 1.3 K/uL (ref 0.7–4.0)
MCH: 27.2 pg (ref 26.0–34.0)
MCHC: 32.3 g/dL (ref 30.0–36.0)
MCV: 84.3 fL (ref 80.0–100.0)
Monocytes Absolute: 0.9 K/uL (ref 0.1–1.0)
Monocytes Relative: 7 %
Neutro Abs: 10.1 K/uL — ABNORMAL HIGH (ref 1.7–7.7)
Neutrophils Relative %: 79 %
Platelet Count: 324 K/uL (ref 150–400)
RBC: 3.89 MIL/uL — ABNORMAL LOW (ref 4.22–5.81)
RDW: 18.9 % — ABNORMAL HIGH (ref 11.5–15.5)
WBC Count: 12.9 K/uL — ABNORMAL HIGH (ref 4.0–10.5)
nRBC: 0 % (ref 0.0–0.2)

## 2023-09-05 LAB — CMP (CANCER CENTER ONLY)
ALT: 13 U/L (ref 0–44)
AST: 18 U/L (ref 15–41)
Albumin: 3.5 g/dL (ref 3.5–5.0)
Alkaline Phosphatase: 63 U/L (ref 38–126)
Anion gap: 5 (ref 5–15)
BUN: 18 mg/dL (ref 8–23)
CO2: 27 mmol/L (ref 22–32)
Calcium: 9.8 mg/dL (ref 8.9–10.3)
Chloride: 100 mmol/L (ref 98–111)
Creatinine: 0.76 mg/dL (ref 0.61–1.24)
GFR, Estimated: >60 mL/min (ref >60)
Glucose, Bld: 114 mg/dL — ABNORMAL HIGH (ref 70–99)
Potassium: 4.1 mmol/L (ref 3.5–5.1)
Sodium: 132 mmol/L — ABNORMAL LOW (ref 135–145)
Total Bilirubin: 0.4 mg/dL (ref 0.0–1.2)
Total Protein: 8.5 g/dL — ABNORMAL HIGH (ref 6.5–8.1)

## 2023-09-05 LAB — SAMPLE TO BLOOD BANK

## 2023-09-05 MED ORDER — TRAMADOL HCL 50 MG PO TABS: 50.0000 mg | ORAL_TABLET | Freq: Four times a day (QID) | ORAL | 0 refills | Status: DC | PRN

## 2023-09-05 NOTE — Progress Notes (Signed)
 [facility] [phone]   [phone]  PROGRESS NOTE  Patient Care Team: Rolinda Millman, MD as PCP - General (Family Medicine)  Hematological/Oncological History # Metastatic Adenocarcinoma of the Lung  01/07/2020 : CT of the chest with contrast performed which showed interval enlargement of a spiculated nodule in the central left upper lobe measuring 1.2 x 1.0 cm and highly suspicious for primary lung malignancy 02/06/2020 :PET scan performed which showed a hypermetabolic irregular solid 1.3 cm left upper lobe pulmonary nodule compatible with malignancy without any other hypermetabolic lesions 05/04/7975- 04/19/2020:  SBRT to the lung lesion 04/28/2021: CT C/A/P showed multifocal lytic bone metastases are identified. Lesion within the spine of the left scapula and lesions involving bilateral iliac bones and left sacral wing. Establish care with Dr. Federico  05/03/2021: biopsy of right iliac lytic lesion showed metastatic carcinoma, consistent with lung primary.  07/08/2021: Cycle 1 Day 1 of Carbo/Pem/Pem 07/29/2021: Cycle 2 Day 1 of Carbo/Pem/Pem 08/11/2021-08/24/2021: Received palliative radiation to osseous metastasis in the left shoulder. 30 Gy in 10 fractions.  08/19/2021: Cycle 3 Day 1 of Carbo/Pem/Pem 09/09/2021: Cycle 4 Day 1 of Carbo/Pem/Pem 09/23/2021: CT CAP: stable disease 09/30/2021: Cycle 5 Day 1 of Pem/Pem 10/21/2021: Cycle 6 Day 1 of Pembrolizumab . Held pemetrexed  due to anemia. 11/16/2021: Cycle 7 Day 1 of Pem/Pem.   12/16/2021: Cycle 8 Day 1 of Pem/Pem.   01/06/2022: Cycle 9 Day 1 of Pem/Pem.  01/27/2022: Cycle 10 Day 1 of Pem/Pem 02/17/2022: Cycle 11 Day 1 of Pem/Pem 03/10/2022: Cycle 12 Day 1 of Pem/Pem (HELD due to anemia with Hgb of 6.4) 03/17/2022: Cycle 12 Day 1 of Pem/Pem (HELD pemextrexed due to cytopenias/kidney function) 04/10/2022: Cycle 13 Day 1 of Pem/Pem (HELD pemextrexed due to cytopenias/kidney function) 04/28/2022: Cycle 14 Day 1 of Pem/Pem  (HELD pemextrexed due to cytopenias/kidney function) 05/23/2022:  Cycle 15 Day 1 of Pem/Pem (HELD pemextrexed due to cytopenias/kidney function) 06/15/2022: Cycle 16 Day 1 of Pembrolizumab .  07/07/2022: Cycle 17 Day 1 of Pembrolizumab .  07/28/2022: Cycle 18 Day 1 of Pembrolizumab  08/18/2022: Cycle 19 Day 1 of Pembrolizumab  HELD due to hyponatremia of 118, new bone metastases. 08/25/2022: Cycle 19 Day 1 of Pembrolizumab  08/28/2022-09/13/2022: Received palliative radiation to right pelvis with total dose of 30 Gy in 10 Fx.  09/22/2022:  Cycle 20 Day 1 of Pembrolizumab  10/13/2022:  Cycle 21 Day 1 of Pembrolizumab  12/11/2022: Cycle 1 Day 1 of Docetaxel /Ramucircumab.  01/05/2023: Cycle 2 Day 1 of Docetaxel /Ramucircumab.  01/26/2023: Cycle 3 Day 1 of Docetaxel /Ramucircumab 02/16/2023:  Cycle 4 Day 1 of Docetaxel /Ramucircumab 03/09/2023: Cycle 5 Day 1 of Docetaxel /Ramucircumab 03/30/2023:  Cycle 6 Day 1 of Docetaxel /Ramucircumab 04/20/2023-04/23/2023: Admitted for slurred speech and weakness. MRI brain concerning for stroke. CTA chest found progression of irregular consolidation in the left apex.  04/27/2023: Cycle 1, Day 1 of Gemcitabine  05/25/2023: Cycle 2, Day 1 of Gemcitabine  06/22/2023: Cycle 3, Day 1 of Gemcitabine   #Adenocarcinoma of the Prostate, [id number] 08/19/2019-10/07/2019: 70 Gy in 28 fractions of 2.5 Gy.  Radiation to the prostate was under the care of Dr. Donnice Barge  Interval History:  Terry Page. 68 y.o. male with medical history significant for metastatic adenocarcinoma of the lung who presents for a follow up visit. The patient's last visit was on 08/31/2023. In the interim since the last visit, he has been off chemotherapy.   On exam today, Terry Page reports he struggles with low back pain that he rates 8 out of 10 today. His pain  does improve to 6 out of 10 with tramadol . He reports decreased PO intake and weight loss due to dental issues. He is trying to supplement with protein shakes.  He denies nausea, vomiting or bowel habit changes. He denies easy bruising or signs of bleeding. He denies fevers, chills, sweats, shortness of breath, chest pain or cough. He has no other complaints. Rest of the ROS is below.   MEDICAL HISTORY:  Past Medical History:  Diagnosis Date   Alcohol abuse    Asthma    Bullous emphysema ([name])    Essential hypertension 04/10/2020   Hemorrhoids    History of radiation therapy 04/08/20-04/19/20   IMRT- Left lung- Dr. Lynwood Nasuti   Incidental lung nodule, greater than or equal to 8mm 10/22/2017   Left upper lobe - discovered on CTA   Prostate cancer ([name])    Spontaneous pneumothorax 10/20/2017   right   Tobacco abuse     SURGICAL HISTORY: Past Surgical History:  Procedure Laterality Date   BACK SURGERY     IR IMAGING GUIDED PORT INSERTION  06/29/2021   PROSTATE BIOPSY      SOCIAL HISTORY: Social History   Socioeconomic History   Marital status: Single    Spouse name: Not on file   Number of children: Not on file   Years of education: Not on file   Highest education level: Not on file  Occupational History   Occupation: retired  Tobacco Use   Smoking status: Former    Current packs/day: 0.00    Types: Cigarettes    Quit date: 11/22/2019    Years since quitting: 3.7   Smokeless tobacco: Never   Tobacco comments:    Patient reports quit 3 years ago. 06/25/20. [facility]  Vaping Use   Vaping status: Never Used  Substance and Sexual Activity   Alcohol use: Yes    Alcohol/week: 12.0 standard drinks of alcohol    Types: 12 Cans of beer per week    Comment: daily sometimes   Drug use: No   Sexual activity: Not Currently  Other Topics Concern   Not on file  Social History Narrative   Not on file   Social Drivers of Health   Financial Resource Strain: Not on file  Food Insecurity: No Food Insecurity (04/22/2023)   Hunger Vital Sign    Worried About Running Out of Food in the Last Year: Never true    Ran Out of Food in the Last  Year: Never true  Transportation Needs: No Transportation Needs (04/22/2023)   PRAPARE - Administrator, Civil Service (Medical): No    Lack of Transportation (Non-Medical): No  Recent Concern: Transportation Needs - Unmet Transportation Needs (04/21/2023)   PRAPARE - Administrator, Civil Service (Medical): Yes    Lack of Transportation (Non-Medical): No  Physical Activity: Not on file  Stress: Not on file  Social Connections: Moderately Integrated (04/21/2023)   Social Connection and Isolation Panel    Frequency of Communication with Friends and Family: More than three times a week    Frequency of Social Gatherings with Friends and Family: More than three times a week    Attends Religious Services: 1 to 4 times per year    Active Member of [employer] or Organizations: No    Attends Banker Meetings: Never    Marital Status: Married  Catering manager Violence: Not At Risk (04/22/2023)   Humiliation, Afraid, Rape, and Kick questionnaire    Fear  of Current or Ex-Partner: No    Emotionally Abused: No    Physically Abused: No    Sexually Abused: No    FAMILY HISTORY: Family History  Problem Relation Age of Onset   Cancer Cousin        maternal cousin   Cancer Cousin        paternal cousin   Cancer Cousin    Colon polyps Neg Hx    Pancreatic disease Neg Hx    Pancreatic cancer Neg Hx    Breast cancer Neg Hx    Colon cancer Neg Hx     ALLERGIES:  is allergic to morphine  and oxycodone .  MEDICATIONS:  Current Outpatient Medications  Medication Sig Dispense Refill   cyanocobalamin  (VITAMIN B12) 1000 MCG tablet Take 1 tablet (1,000 mcg total) by mouth daily. (Patient taking differently: Take 1,000 mcg by mouth as needed (When directed by physican).) 90 tablet 1   ipratropium-albuterol  (DUONEB) 0.5-2.5 (3) MG/3ML SOLN Take 3 mLs by nebulization every 6 (six) hours as needed. (Patient taking differently: Take 3 mLs by nebulization every 6 (six) hours as  needed (shortness of breathing or wheezing).) 360 mL 0   acetaminophen  (TYLENOL ) 325 MG tablet Take 2 tablets (650 mg total) by mouth every 6 (six) hours as needed for mild pain or headache (fever >/= 101).     albuterol  (VENTOLIN  HFA) 108 (90 Base) MCG/ACT inhaler Inhale 2 puffs into the lungs every 6 (six) hours as needed for wheezing or shortness of breath.     amoxicillin -clavulanate (AUGMENTIN ) 875-125 MG tablet Take 1 tablet by mouth 2 (two) times daily. 10 tablet 0   atorvastatin  (LIPITOR) 40 MG tablet Take 1 tablet (40 mg total) by mouth daily. 30 tablet 1   dronabinol  (MARINOL ) 5 MG capsule Take 1 capsule (5 mg total) by mouth 2 (two) times daily before lunch and supper. 60 capsule 0   ferrous sulfate  325 (65 FE) MG EC tablet Take 1 tablet (325 mg total) by mouth daily with breakfast. 30 tablet 3   folic acid  (FOLVITE ) 1 MG tablet Take 1 tablet (1 mg total) by mouth daily. 90 tablet 3   hydrOXYzine  (ATARAX ) 25 MG tablet Take 1 tablet (25 mg total) by mouth at bedtime as needed for itching. 30 tablet 11   lidocaine  (LIDODERM ) 5 % Place 1 patch onto the skin daily. Remove & Discard patch within 12 hours or as directed by MD. Apply to low back 30 patch 0   lidocaine -prilocaine  (EMLA ) cream Apply 1 Application topically as needed. 30 g 0   mirtazapine  (REMERON ) 7.5 MG tablet Take 1 tablet (7.5 mg total) by mouth at bedtime. 30 tablet 0   polyethylene glycol (MIRALAX ) 17 g packet Take 17 g by mouth daily. 30 each 2   potassium chloride  SA (KLOR-CON  M) 20 MEQ tablet TAKE 1 TABLET(20 MEQ) BY MOUTH DAILY 90 tablet 0   senna-docusate (STIMULANT LAXATIVE) 8.6-50 MG tablet Take 2 tablets by mouth 2 (two) times daily. May add additional tablet as needed 150 tablet 3   sodium chloride  1 g tablet Take 1 tablet (1 g total) by mouth 3 (three) times daily with meals. 90 tablet 3   thiamine  100 MG tablet Take 1 tablet (100 mg total) by mouth daily.     traMADol  (ULTRAM ) 50 MG tablet Take 1-2 tablets (50-100  mg total) by mouth every 6 (six) hours as needed. 120 tablet 0   No current facility-administered medications for this visit.  REVIEW OF SYSTEMS:   Constitutional: ( - ) fevers, ( - )  chills , ( - ) night sweats Eyes: ( - ) blurriness of vision, ( - ) double vision, ( - ) watery eyes Ears, nose, mouth, throat, and face: ( - ) mucositis, ( - ) sore throat Respiratory: ( - ) cough, ( - ) dyspnea, ( - ) wheezes Cardiovascular: ( - ) palpitation, ( - ) chest discomfort, ( - ) lower extremity swelling Gastrointestinal:  ( - ) nausea, ( - ) heartburn, ( - ) change in bowel habits Skin: ( - ) abnormal skin rashes Lymphatics: ( - ) new lymphadenopathy, ( - ) easy bruising Neurological: ( - ) numbness, ( - ) tingling, ( - ) new weaknesses Behavioral/Psych: ( - ) mood change, ( - ) new changes  All other systems were reviewed with the patient and are negative.  PHYSICAL EXAMINATION: ECOG PERFORMANCE STATUS: 1 - Symptomatic but completely ambulatory  Vitals:   09/05/23 1051  BP: (!) 138/91  Pulse: 98  Resp: 18  Temp: (!) 97.4 F (36.3 C)  SpO2: 100%    Filed Weights   09/05/23 1051  Weight: 131 lb 11.2 oz (59.7 kg)     GENERAL: Well-appearing elderly African-American male, alert, no distress and comfortable SKIN: skin color, texture, turgor are normal, no rashes or significant lesions EYES: conjunctiva are pink and non-injected, sclera clear LUNGS: clear to auscultation and percussion with normal breathing effort HEART: regular rate & rhythm and no murmurs and no lower extremity edema Musculoskeletal: no cyanosis of digits and no clubbing  PSYCH: alert & oriented x 3, fluent speech NEURO: no focal motor/sensory deficits  LABORATORY DATA:  I have reviewed the data as listed    Latest Ref Rng & Units 09/05/2023   10:20 AM 08/03/2023    9:21 AM 07/27/2023    8:19 AM  CBC  WBC 4.0 - 10.5 K/uL 12.9  15.5  13.8   Hemoglobin 13.0 - 17.0 g/dL 89.3  8.1  8.7   Hematocrit 39.0 - 52.0  % 32.8  25.3  26.0   Platelets 150 - 400 K/uL 324  438  762        Latest Ref Rng & Units 09/05/2023   10:20 AM 08/03/2023    9:21 AM 07/27/2023    8:19 AM  CMP  Glucose 70 - 99 mg/dL 885  85  92   BUN 8 - 23 mg/dL 18  16  13    Creatinine 0.61 - 1.24 mg/dL 9.23  9.13  9.17   Sodium 135 - 145 mmol/L 132  129  134   Potassium 3.5 - 5.1 mmol/L 4.1  4.5  3.6   Chloride 98 - 111 mmol/L 100  97  99   CO2 22 - 32 mmol/L 27  28  29    Calcium  8.9 - 10.3 mg/dL 9.8  9.2  9.3   Total Protein 6.5 - 8.1 g/dL 8.5  7.9  7.6   Total Bilirubin 0.0 - 1.2 mg/dL 0.4  0.3  0.2   Alkaline Phos 38 - 126 U/L 63  67  77   AST 15 - 41 U/L 18  25  39   ALT 0 - 44 U/L 13  24  30      Lab Results  Component Value Date   MPROTEIN Not Observed 04/28/2021   Lab Results  Component Value Date   KPAFRELGTCHN 33.6 (H) 04/28/2021   LAMBDASER 22.0 04/28/2021  KAPLAMBRATIO 1.53 04/28/2021    RADIOGRAPHIC STUDIES: No results found.     ASSESSMENT & PLAN Terry Page. Is a 68 y.o.  male with medical history significant for metastatic adenocarcinoma of the lung who presents for a follow up visit.  # Metastatic Adenocarcinoma of the Lung  -- NGS testing from this patient shows no evidence of targetable mutation. --MRI brain shows no evidence of intracranial metastases --Started carboplatin , pembrolizumab , and pemetrexed  as treatment for his cancer on 07/08/2021. --Received palliative radiation to osseous metastasis of the left shoulder from 08/11/2021-08/24/2021. Planned dose is 30 Gy in 10 fx.  --Discontinued Pemetrexed  on 03/17/2022 due to persistent anemia and kidney dysfunction. --Restaging CT CAP from 08/09/2022 shows interval enlargement of multiple mixed lytic and sclerotic bone metastases involving the right pelvis. Other bone metastases including of the left scapula, sacrum, and left hemipelvis are unchanged. Unchanged treated central LUL mass. Recommend to continue on pembrolizumab  therapy and arrange  for palliative radiation to enlarging bone metastases.  --CTA chest from 04/20/2023 showed progression of disease to the consolidation in the left apex. Recommended to change therapy  to single agent Gemcitabine  3 weeks on, 1 week off.  --Started Cycle 1, Day 1 of Gemcitabine  on 04/27/2023 Plan: --Gemcitabine  HELD due to progression seen on CT scan from 07/16/2023.  --labs from today were reviewed. Labs show white blood cell count 12.9, hemoglobin 10.6, MCV 84.3, platelets 324. Creatinine and LFTs normal.  --patient progressed on 3rd line therapy, no targetable mutations. Options are best supportive care or clinical trial. No active trials for his situation locally. Discussed second opinion with Dr. Sherrod versus referral to academic institution. Patient prefers referral to Dr. Sherrod first.  --Scheduled for consultation with Dr. Sherrod on 09/19/2023.   #Left sided low back pain with sciatica: --History of lumbar spondylosis and moderate to severe facet arthrosis without spinal stenosis and mild right neural foraminal stenosis at L4-5 seen on MRI lumbar 04/24/2020.   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  --Pain is slightly improved but still present.  --Continue to take tramadol  50-100 mg q 6 hours for pain control.   #Hyponatremia-stable --Chronic in nature --Sodium level dropped from 127 on 08/09/2022 to 118.  Today sodium is stable at 132 --Etiologies including SIADH, immunotherapy induced, too much water intake.  --Patient is asymptomatic without nausea/vomiting, headaches, confusion, seizures, etc --Currently on salt tablets 1gm three times daily.  --Strict ED precautions given for any new symptoms as discussed above.   # Iron Deficiency Anemia #Chemotherapy induced anemia --Currently on ferrous sulfate   325 mg PO daily --No evidence of iron/b12/folate deficiency per labs from 04/22/2023.    #Supportive Care -- chemotherapy education complete -- port placed -- zofran  8mg  q8H PRN and compazine  10mg  PO q6H for nausea -- EMLA  cream for port   No orders of the defined types were placed in this encounter.   All questions were answered. The patient knows to call the clinic with any problems, questions or concerns.  I have spent a total of 25 minutes minutes of face-to-face and non-face-to-face time, preparing to see the patient, performing a medically appropriate examination, counseling and educating the patient, referring and communicating with other health care professionals, documenting clinical information in the electronic health record, and care coordination.   Johnston Police PA-C Dept of Hematology and Oncology  [facility] Phone: [phone]   09/05/2023 12:55 PM

## 2023-09-12 ENCOUNTER — Other Ambulatory Visit: Payer: Self-pay

## 2023-09-12 ENCOUNTER — Inpatient Hospital Stay (HOSPITAL_COMMUNITY)
Admission: EM | Admit: 2023-09-12 | Discharge: 2023-09-17 | DRG: 388 | Disposition: A | Discharge status: Hospice | Attending: Internal Medicine | Admitting: Internal Medicine

## 2023-09-12 ENCOUNTER — Emergency Department (HOSPITAL_COMMUNITY)

## 2023-09-12 DIAGNOSIS — R110 Nausea: Secondary | ICD-10-CM | POA: Diagnosis present

## 2023-09-12 DIAGNOSIS — J45909 Unspecified asthma, uncomplicated: Secondary | ICD-10-CM | POA: Diagnosis present

## 2023-09-12 DIAGNOSIS — C7931 Secondary malignant neoplasm of brain: Secondary | ICD-10-CM | POA: Diagnosis present

## 2023-09-12 DIAGNOSIS — D509 Iron deficiency anemia, unspecified: Secondary | ICD-10-CM | POA: Diagnosis present

## 2023-09-12 DIAGNOSIS — K56699 Other intestinal obstruction unspecified as to partial versus complete obstruction: Secondary | ICD-10-CM | POA: Diagnosis present

## 2023-09-12 DIAGNOSIS — R4182 Altered mental status, unspecified: Secondary | ICD-10-CM | POA: Diagnosis not present

## 2023-09-12 DIAGNOSIS — Z923 Personal history of irradiation: Secondary | ICD-10-CM

## 2023-09-12 DIAGNOSIS — I10 Essential (primary) hypertension: Secondary | ICD-10-CM | POA: Diagnosis present

## 2023-09-12 DIAGNOSIS — M25551 Pain in right hip: Secondary | ICD-10-CM | POA: Diagnosis present

## 2023-09-12 DIAGNOSIS — Z66 Do not resuscitate: Secondary | ICD-10-CM | POA: Diagnosis present

## 2023-09-12 DIAGNOSIS — Z79899 Other long term (current) drug therapy: Secondary | ICD-10-CM | POA: Diagnosis not present

## 2023-09-12 DIAGNOSIS — G936 Cerebral edema: Secondary | ICD-10-CM | POA: Diagnosis present

## 2023-09-12 DIAGNOSIS — R4589 Other symptoms and signs involving emotional state: Secondary | ICD-10-CM | POA: Diagnosis not present

## 2023-09-12 DIAGNOSIS — M545 Low back pain, unspecified: Secondary | ICD-10-CM | POA: Diagnosis present

## 2023-09-12 DIAGNOSIS — D6481 Anemia due to antineoplastic chemotherapy: Secondary | ICD-10-CM | POA: Diagnosis present

## 2023-09-12 DIAGNOSIS — K5900 Constipation, unspecified: Secondary | ICD-10-CM | POA: Diagnosis present

## 2023-09-12 DIAGNOSIS — Z515 Encounter for palliative care: Secondary | ICD-10-CM | POA: Diagnosis not present

## 2023-09-12 DIAGNOSIS — G893 Neoplasm related pain (acute) (chronic): Secondary | ICD-10-CM | POA: Diagnosis present

## 2023-09-12 DIAGNOSIS — E871 Hypo-osmolality and hyponatremia: Secondary | ICD-10-CM | POA: Diagnosis present

## 2023-09-12 DIAGNOSIS — T451X5A Adverse effect of antineoplastic and immunosuppressive drugs, initial encounter: Secondary | ICD-10-CM | POA: Diagnosis present

## 2023-09-12 DIAGNOSIS — K56609 Unspecified intestinal obstruction, unspecified as to partial versus complete obstruction: Principal | ICD-10-CM

## 2023-09-12 DIAGNOSIS — Z87891 Personal history of nicotine dependence: Secondary | ICD-10-CM

## 2023-09-12 DIAGNOSIS — J449 Chronic obstructive pulmonary disease, unspecified: Secondary | ICD-10-CM | POA: Diagnosis present

## 2023-09-12 DIAGNOSIS — Z85118 Personal history of other malignant neoplasm of bronchus and lung: Secondary | ICD-10-CM

## 2023-09-12 DIAGNOSIS — C7951 Secondary malignant neoplasm of bone: Secondary | ICD-10-CM | POA: Diagnosis present

## 2023-09-12 DIAGNOSIS — R000 Tachycardia, unspecified: Secondary | ICD-10-CM | POA: Diagnosis present

## 2023-09-12 DIAGNOSIS — E876 Hypokalemia: Secondary | ICD-10-CM | POA: Diagnosis present

## 2023-09-12 DIAGNOSIS — K5909 Other constipation: Secondary | ICD-10-CM | POA: Diagnosis present

## 2023-09-12 DIAGNOSIS — Z8546 Personal history of malignant neoplasm of prostate: Secondary | ICD-10-CM

## 2023-09-12 DIAGNOSIS — J439 Emphysema, unspecified: Secondary | ICD-10-CM | POA: Diagnosis present

## 2023-09-12 DIAGNOSIS — Z7189 Other specified counseling: Secondary | ICD-10-CM

## 2023-09-12 DIAGNOSIS — K567 Ileus, unspecified: Secondary | ICD-10-CM | POA: Diagnosis not present

## 2023-09-12 DIAGNOSIS — C3490 Malignant neoplasm of unspecified part of unspecified bronchus or lung: Secondary | ICD-10-CM | POA: Diagnosis not present

## 2023-09-12 DIAGNOSIS — C3412 Malignant neoplasm of upper lobe, left bronchus or lung: Secondary | ICD-10-CM | POA: Diagnosis present

## 2023-09-12 DIAGNOSIS — Z885 Allergy status to narcotic agent status: Secondary | ICD-10-CM

## 2023-09-12 LAB — CBC WITH DIFFERENTIAL/PLATELET
Abs Immature Granulocytes: 0.04 K/uL (ref 0.00–0.07)
Basophils Absolute: 0 K/uL (ref 0.0–0.1)
Basophils Relative: 0 %
Eosinophils Absolute: 0.2 K/uL (ref 0.0–0.5)
Eosinophils Relative: 1 %
HCT: 34.2 % — ABNORMAL LOW (ref 39.0–52.0)
Hemoglobin: 10.6 g/dL — ABNORMAL LOW (ref 13.0–17.0)
Immature Granulocytes: 0 %
Lymphocytes Relative: 11 %
Lymphs Abs: 1.4 K/uL (ref 0.7–4.0)
MCH: 26.3 pg (ref 26.0–34.0)
MCHC: 31 g/dL (ref 30.0–36.0)
MCV: 84.9 fL (ref 80.0–100.0)
Monocytes Absolute: 0.9 K/uL (ref 0.1–1.0)
Monocytes Relative: 8 %
Neutro Abs: 9.7 K/uL — ABNORMAL HIGH (ref 1.7–7.7)
Neutrophils Relative %: 80 %
Platelets: 345 K/uL (ref 150–400)
RBC: 4.03 MIL/uL — ABNORMAL LOW (ref 4.22–5.81)
RDW: 18.6 % — ABNORMAL HIGH (ref 11.5–15.5)
WBC: 12.3 K/uL — ABNORMAL HIGH (ref 4.0–10.5)
nRBC: 0 % (ref 0.0–0.2)

## 2023-09-12 LAB — COMPREHENSIVE METABOLIC PANEL WITH GFR
ALT: 9 U/L (ref 0–44)
AST: 20 U/L (ref 15–41)
Albumin: 3.8 g/dL (ref 3.5–5.0)
Alkaline Phosphatase: 78 U/L (ref 38–126)
Anion gap: 13 (ref 5–15)
BUN: 16 mg/dL (ref 8–23)
CO2: 22 mmol/L (ref 22–32)
Calcium: 10.3 mg/dL (ref 8.9–10.3)
Chloride: 95 mmol/L — ABNORMAL LOW (ref 98–111)
Creatinine, Ser: 0.85 mg/dL (ref 0.61–1.24)
GFR, Estimated: >60 mL/min (ref >60)
Glucose, Bld: 108 mg/dL — ABNORMAL HIGH (ref 70–99)
Potassium: 4.4 mmol/L (ref 3.5–5.1)
Sodium: 130 mmol/L — ABNORMAL LOW (ref 135–145)
Total Bilirubin: 0.5 mg/dL (ref 0.0–1.2)
Total Protein: 8.3 g/dL — ABNORMAL HIGH (ref 6.5–8.1)

## 2023-09-12 MED ORDER — LACTATED RINGERS IV SOLN: INTRAVENOUS | Status: AC

## 2023-09-12 MED ORDER — ACETAMINOPHEN 325 MG PO TABS
650.0000 mg | ORAL_TABLET | Freq: Four times a day (QID) | ORAL | Status: DC | PRN
  Administered 2023-09-13 – 2023-09-16 (×3): 650 mg via ORAL
  Filled 2023-09-12 (×3): qty 2

## 2023-09-12 MED ORDER — SODIUM CHLORIDE 0.9 % IV BOLUS
1000.0000 mL | Freq: Once | INTRAVENOUS | Status: AC
  Administered 2023-09-12: 1000 mL via INTRAVENOUS

## 2023-09-12 MED ORDER — IPRATROPIUM-ALBUTEROL 0.5-2.5 (3) MG/3ML IN SOLN: 3.0000 mL | Freq: Four times a day (QID) | RESPIRATORY_TRACT | Status: DC | PRN

## 2023-09-12 MED ORDER — ENOXAPARIN SODIUM 40 MG/0.4ML IJ SOSY
40.0000 mg | PREFILLED_SYRINGE | INTRAMUSCULAR | Status: DC
  Administered 2023-09-13 – 2023-09-16 (×4): 40 mg via SUBCUTANEOUS
  Filled 2023-09-12 (×4): qty 0.4

## 2023-09-12 MED ORDER — ONDANSETRON HCL 4 MG PO TABS: 4.0000 mg | ORAL_TABLET | Freq: Four times a day (QID) | ORAL | Status: DC | PRN

## 2023-09-12 MED ORDER — PANTOPRAZOLE SODIUM 40 MG IV SOLR
40.0000 mg | INTRAVENOUS | Status: DC
  Administered 2023-09-12 – 2023-09-13 (×2): 40 mg via INTRAVENOUS
  Filled 2023-09-12 (×2): qty 10

## 2023-09-12 MED ORDER — LIDOCAINE 5 % EX PTCH
1.0000 | MEDICATED_PATCH | CUTANEOUS | Status: DC
  Administered 2023-09-12 – 2023-09-16 (×5): 1 via TRANSDERMAL
  Filled 2023-09-12 (×6): qty 1

## 2023-09-12 MED ORDER — TRAMADOL HCL 50 MG PO TABS
50.0000 mg | ORAL_TABLET | Freq: Three times a day (TID) | ORAL | Status: DC | PRN
  Administered 2023-09-13 (×2): 50 mg via ORAL
  Filled 2023-09-12 (×2): qty 1

## 2023-09-12 MED ORDER — POTASSIUM CL IN DEXTROSE 5% 20 MEQ/L IV SOLN
20.0000 meq | INTRAVENOUS | Status: DC
Start: 2023-09-12 — End: 2023-09-12

## 2023-09-12 MED ORDER — ALBUTEROL SULFATE HFA 108 (90 BASE) MCG/ACT IN AERS: 2.0000 | INHALATION_SPRAY | Freq: Four times a day (QID) | RESPIRATORY_TRACT | Status: DC | PRN

## 2023-09-12 MED ORDER — ALBUTEROL SULFATE (2.5 MG/3ML) 0.083% IN NEBU: 2.5000 mg | INHALATION_SOLUTION | Freq: Four times a day (QID) | RESPIRATORY_TRACT | Status: DC | PRN

## 2023-09-12 MED ORDER — MAGNESIUM CITRATE PO SOLN
1.0000 | Freq: Once | ORAL | Status: DC
  Filled 2023-09-12: qty 296

## 2023-09-12 MED ORDER — FLEET ENEMA RE ENEM
1.0000 | ENEMA | Freq: Once | RECTAL | Status: DC
  Filled 2023-09-12: qty 1

## 2023-09-12 MED ORDER — BISACODYL 10 MG RE SUPP
10.0000 mg | Freq: Once | RECTAL | Status: AC
  Administered 2023-09-12: 10 mg via RECTAL
  Filled 2023-09-12: qty 1

## 2023-09-12 MED ORDER — ACETAMINOPHEN 650 MG RE SUPP: 650.0000 mg | Freq: Four times a day (QID) | RECTAL | Status: DC | PRN

## 2023-09-12 MED ORDER — ONDANSETRON HCL 4 MG/2ML IJ SOLN
4.0000 mg | Freq: Four times a day (QID) | INTRAMUSCULAR | Status: DC | PRN
  Administered 2023-09-14 – 2023-09-15 (×2): 4 mg via INTRAVENOUS
  Filled 2023-09-12 (×2): qty 2

## 2023-09-12 NOTE — H&P (Signed)
 History and Physical    Terry Page. FMW:[id number] DOB: Jul 23, 1955 DOA: 09/12/2023  PCP: Rolinda Millman, MD   Patient coming from: Home Chief Complaint  Patient presents with   Constipation   YEP:Qmzizmprx Terry Page. is a 68 y.o. 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  He passed gas earlier today and still passing.Has been having worsening constipation despite taking senna regularly. Recent oncology visit on 09/05/23- gemcitabine  held due to progression of the disease on CT scan from 07/16/2023 and he has progressed on third line therapy no targetable mutation. Patient otherwise denies any nausea, vomiting, chest pain, shortness of breath, fever, chills, headache, focal weakness, numbness tingling, speech difficulties  In ED: Heart rate 118 BP stable afebrile on room air.  Labs were obtained BMP pending CBC with similar leukocytosis 12.3 hemoglobin 10.6. X-ray abdomen> small bowel obstruction versus ileus moderate colonic stool burden General Surgery consulted and [facility] was consulted for admission  Assessment and plan:  Ileus versus constipation Moderate colonic stool burden Rectal pain: Appreciate general surgery input, NG tube has been ordered in ED and patient refused NG. No vomiting for now so holding off NGT. Continue n.p.o., IV fluid hydration, cont pain management, cont  supportive care as per surgery, add bowel regimen PR. He is requesting manual disimpaction- will order PR.  Chronic right hip pain Low back pain:  Hyponatremia: Labs pending  Iron deficiency anemia Chemo-induced anemia:  Metastatic adenocarcinoma of the lung: His disease has progressed despite getting third line agent.  Will benefit  with palliative care evaluation at some point He is due to see Dr Sherrod on coming Wednesday- we can try to touch base with oncology while pt is admitted.  Prostate cancer hx   DVT prophylaxis:  Code Status:   Code Status: Prior  Objective: Vitals last 24 hrs: Vitals:   09/12/23 1850  BP: 100/77  Pulse: (!) 118  Resp: 16  Temp: 98.7 F (37.1 C)  TempSrc: Oral  SpO2: 97%   Physical Examination: General exam: alert awake, oriented, older than stated age HEENT:Oral mucosa moist, Ear/Nose WNL grossly Respiratory system: Bilaterally clear BS,no use of accessory muscle Cardiovascular system: S1 & S2 +, No JVD. Gastrointestinal system: Abdomen soft, non tender ,ND, BS+ Nervous System: Alert, awake, moving all extremities,and following commands. Extremities: extremities warm, leg edema neg Skin: No rashes,no icterus. MSK: Normal muscle bulk,tone, power   Medications reviewed:  Scheduled Meds: Continuous Infusions: Diet: Diet Order             Diet NPO time specified  Diet effective now                     Severity of Illness: The appropriate patient status for this patient is INPATIENT. Inpatient status is judged to be reasonable and necessary in order to provide the required intensity of service to ensure the patient's safety. The patient's presenting symptoms, physical exam findings, and initial radiographic and laboratory data in the context of their chronic comorbidities is felt to place them at high risk for further clinical deterioration. Furthermore, it is not anticipated that the patient will be medically stable for discharge from the hospital within 2 midnights of admission.   * I certify that at  the point of admission it is my clinical judgment that the patient will require inpatient hospital care spanning beyond 2 midnights from the point of admission due to high intensity of service, high risk for further deterioration and high frequency of surveillance  required.*  Family Communication: Admission, patients condition and plan of care including tests being ordered have been discussed with the patient who indicate understanding and agree with the plan and Code Status.  Consults called:  SURGERY  Review of Systems: All systems were reviewed and were negative except as mentioned in HPI above. Negative for fever Negative for chest pain Negative for shortness of breath  Past Medical History:  Diagnosis Date   Alcohol abuse    Asthma    Bullous emphysema ([name])    Essential hypertension 04/10/2020   Hemorrhoids    History of radiation therapy 04/08/20-04/19/20   IMRT- Left lung- Dr. Lynwood Nasuti   Incidental lung nodule, greater than or equal to 8mm 10/22/2017   Left upper lobe - discovered on CTA   Prostate cancer ([name])    Spontaneous pneumothorax 10/20/2017   right   Tobacco abuse     Past Surgical History:  Procedure Laterality Date   BACK SURGERY     IR IMAGING GUIDED PORT INSERTION  06/29/2021   PROSTATE BIOPSY       reports that he quit smoking about 3 years ago. His smoking use included cigarettes. He has never used smokeless tobacco. He reports current alcohol use of about 12.0 standard drinks of alcohol per week. He reports that he does not use drugs.  Allergies  Allergen Reactions   Morphine  Itching   Oxycodone  Itching    Family History  Problem Relation Age of Onset   Cancer Cousin        maternal cousin   Cancer Cousin        paternal cousin   Cancer Cousin    Colon polyps Neg Hx    Pancreatic disease Neg Hx    Pancreatic cancer Neg Hx    Breast cancer Neg Hx    Colon cancer Neg Hx      Prior to Admission medications   Medication Sig Start Date End Date Taking? Authorizing Provider  acetaminophen  (TYLENOL ) 325 MG tablet Take 2 tablets (650 mg total) by mouth every 6 (six) hours as needed for mild pain or headache (fever >/= 101). 05/10/20   Arrien, Elidia Sieving, MD  albuterol  (VENTOLIN  HFA) [phone] Base)  MCG/ACT inhaler Inhale 2 puffs into the lungs every 6 (six) hours as needed for wheezing or shortness of breath. 04/23/23   Perri DELENA Meliton Page., MD  amoxicillin -clavulanate (AUGMENTIN ) 875-125 MG tablet Take 1 tablet by mouth 2 (two) times daily. 07/20/23   Federico Norleen ONEIDA MADISON, MD  atorvastatin  (LIPITOR) 40 MG tablet Take 1 tablet (40 mg total) by mouth daily. 04/24/23 05/24/23  Perri DELENA Meliton Page., MD  cyanocobalamin  (VITAMIN B12) 1000 MCG tablet Take 1 tablet (1,000 mcg total) by mouth daily. Patient taking differently: Take 1,000 mcg by mouth as needed (When directed by physican). 04/10/22   Thayil, Irene T, PA-C  dronabinol  (MARINOL ) 5 MG capsule Take 1 capsule (5 mg total) by mouth 2 (two) times daily before lunch and supper. 08/02/23   Pickenpack-Cousar, Fannie SAILOR, NP  ferrous sulfate  325 (65 FE) MG EC tablet Take 1 tablet (325 mg total) by mouth daily with breakfast. 03/10/22   Neomi Lis T, PA-C  folic acid  (FOLVITE ) 1 MG tablet Take 1  tablet (1 mg total) by mouth daily. 03/09/23   Thayil, Irene T, PA-C  hydrOXYzine  (ATARAX ) 25 MG tablet Take 1 tablet (25 mg total) by mouth at bedtime as needed for itching. 08/27/23   Pickenpack-Cousar, Fannie SAILOR, NP  ipratropium-albuterol  (DUONEB) 0.5-2.5 (3) MG/3ML SOLN Take 3 mLs by nebulization every 6 (six) hours as needed. Patient taking differently: Take 3 mLs by nebulization every 6 (six) hours as needed (shortness of breathing or wheezing). 05/10/20   Arrien, Elidia Sieving, MD  lidocaine  (LIDODERM ) 5 % Place 1 patch onto the skin daily. Remove & Discard patch within 12 hours or as directed by MD. Apply to low back 04/25/23   Pickenpack-Cousar, Fannie SAILOR, NP  lidocaine -prilocaine  (EMLA ) cream Apply 1 Application topically as needed. 04/25/23   Pickenpack-Cousar, Athena N, NP  mirtazapine  (REMERON ) 7.5 MG tablet Take 1 tablet (7.5 mg total) by mouth at bedtime. 07/05/23   Pickenpack-Cousar, Athena N, NP  polyethylene glycol (MIRALAX ) 17 g packet Take 17 g by mouth  daily. 07/07/22   Pickenpack-Cousar, Athena N, NP  potassium chloride  SA (KLOR-CON  M) 20 MEQ tablet TAKE 1 TABLET(20 MEQ) BY MOUTH DAILY 07/12/23   Dorsey, John T IV, MD  senna-docusate (STIMULANT LAXATIVE) 8.6-50 MG tablet Take 2 tablets by mouth 2 (two) times daily. May add additional tablet as needed 08/27/23   Pickenpack-Cousar, Fannie SAILOR, NP  sodium chloride  1 g tablet Take 1 tablet (1 g total) by mouth 3 (three) times daily with meals. 08/06/23   Federico Norleen ONEIDA MADISON, MD  thiamine  100 MG tablet Take 1 tablet (100 mg total) by mouth daily. 05/08/21   Rai, Nydia POUR, MD  traMADol  (ULTRAM ) 50 MG tablet Take 1-2 tablets (50-100 mg total) by mouth every 6 (six) hours as needed. 09/05/23   Neomi Johnston ONEIDA, PA-C   Labs on Admission: I have personally reviewed following labs and imaging studies  CBC: Recent Labs  Lab 09/12/23 2056  WBC 12.3*  NEUTROABS 9.7*  HGB 10.6*  HCT 34.2*  MCV 84.9  PLT 345  Lipid Profile: No results for input(s): TSH, T4TOTAL, FREET4, T3FREE, THYROIDAB in the last 72 hours. Urine analysis:    Component Value Date/Time   COLORURINE YELLOW 04/20/2023 2204   APPEARANCEUR CLEAR 04/20/2023 2204   LABSPEC >1.046 (H) 04/20/2023 2204   PHURINE 6.0 04/20/2023 2204   GLUCOSEU NEGATIVE 04/20/2023 2204   HGBUR NEGATIVE 04/20/2023 2204   BILIRUBINUR NEGATIVE 04/20/2023 2204   KETONESUR NEGATIVE 04/20/2023 2204   PROTEINUR NEGATIVE 04/20/2023 2204   NITRITE NEGATIVE 04/20/2023 2204   LEUKOCYTESUR NEGATIVE 04/20/2023 2204    Radiological Exams on Admission: DG Abd Portable 1 View Result Date: 09/12/2023 CLINICAL DATA:  Constipation. EXAM: PORTABLE ABDOMEN - 1 VIEW COMPARISON:  CT dated 07/16/2023. FINDINGS: Dilated small bowel loops measure up to 3.7 cm and may represent obstruction or ileus. There is moderate stool throughout the colon. No free air. Degenerative changes of the spine and hips. No acute osseous pathology. Poorly visualized bilateral pulmonary nodules.  IMPRESSION: 1. Small bowel obstruction versus ileus. 2. Moderate colonic stool burden. Electronically Signed   By: Vanetta Chou M.D.   On: 09/12/2023 19:56   Mennie LAMY MD [facility]  If 7PM-7AM, please contact night-coverage [email or web address]  09/12/2023, 9:39 PM

## 2023-09-12 NOTE — ED Triage Notes (Signed)
 Pt reports no BM in 2 days, was getting more difficult to pass before that. Passed gas earlier today. Takes senna regularly. Reports rectum pain. Having some trouble urinating now

## 2023-09-12 NOTE — Plan of Care (Signed)
   Problem: Health Behavior/Discharge Planning: Goal: Ability to manage health-related needs will improve Outcome: Progressing

## 2023-09-12 NOTE — ED Provider Notes (Addendum)
 [place] EMERGENCY DEPARTMENT AT [facility] Provider Note   CSN: [id number] Arrival date & time: 09/12/23  1845     Patient presents with: Constipation   Terry Page. is a 68 y.o. male.  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  Patient reports ongoing abdominal pain and constipation for the last 2 days.  Last bowel movement was 2 days ago.  Has been taking senna at home with little relief.  Also notes inability to void to completion although he feels the need to urinate.  No nausea vomiting fevers chills.  No prior history of severe constipation or obstruction.    Constipation      Prior to Admission medications   Medication Sig Start Date End Date Taking? Authorizing Provider  acetaminophen  (TYLENOL ) 325 MG tablet Take 2 tablets (650 mg total) by mouth every 6 (six) hours as needed for mild pain or headache (fever >/= 101). 05/10/20   Arrien, Elidia Sieving, MD  albuterol  (VENTOLIN  HFA) [phone] Base) MCG/ACT inhaler Inhale 2 puffs into the lungs every 6 (six) hours as needed for wheezing or shortness of breath. 04/23/23   Perri DELENA Meliton Mickey., MD  amoxicillin -clavulanate (AUGMENTIN ) 875-125 MG tablet Take 1 tablet by mouth 2 (two) times daily. 07/20/23   Federico Norleen ONEIDA MADISON, MD  atorvastatin  (LIPITOR) 40 MG tablet Take 1 tablet (40 mg total) by mouth daily. 04/24/23 05/24/23  Perri DELENA Meliton Mickey., MD  cyanocobalamin  (VITAMIN B12) 1000 MCG tablet Take 1 tablet (1,000 mcg total) by mouth daily. Patient taking differently: Take 1,000 mcg by mouth as needed (When directed by physican). 04/10/22   Thayil, Irene T, PA-C  dronabinol  (MARINOL ) 5 MG capsule Take 1 capsule (5 mg total) by mouth 2 (two) times daily before lunch and supper. 08/02/23   Pickenpack-Cousar, Fannie SAILOR, NP  ferrous sulfate  325 (65 FE) MG EC tablet Take 1 tablet (325 mg total) by mouth daily with breakfast. 03/10/22   Neomi Lis  T, PA-C  folic acid  (FOLVITE ) 1 MG tablet Take 1 tablet (1 mg total) by mouth daily. 03/09/23   Thayil, Irene T, PA-C  hydrOXYzine  (ATARAX ) 25 MG tablet Take 1 tablet (25 mg total) by mouth at bedtime as needed for itching. 08/27/23   Pickenpack-Cousar, Fannie SAILOR, NP  ipratropium-albuterol  (DUONEB) 0.5-2.5 (3) MG/3ML SOLN Take 3 mLs by nebulization every 6 (six) hours as needed. Patient taking differently: Take 3 mLs by nebulization every 6 (six) hours as needed (shortness of breathing or wheezing). 05/10/20   Arrien, Mauricio Daniel, MD  lidocaine  (LIDODERM ) 5 % Place 1 patch onto the skin daily. Remove & Discard patch within 12 hours or as directed by MD. Apply to low back 04/25/23   Pickenpack-Cousar, Fannie SAILOR, NP  lidocaine -prilocaine  (EMLA ) cream Apply 1 Application topically as needed. 04/25/23   Pickenpack-Cousar, Athena N, NP  mirtazapine  (REMERON ) 7.5 MG tablet Take 1 tablet (7.5 mg total) by mouth at bedtime. 07/05/23   Pickenpack-Cousar, Fannie SAILOR, NP  polyethylene glycol (MIRALAX ) 17 g packet Take 17 g by mouth daily. 07/07/22   Pickenpack-Cousar, Athena N, NP  potassium chloride  SA (KLOR-CON  M) 20 MEQ tablet TAKE 1 TABLET(20 MEQ) BY MOUTH DAILY 07/12/23   Dorsey, John T IV, MD  senna-docusate (STIMULANT LAXATIVE) 8.6-50 MG tablet Take 2 tablets by mouth 2 (two) times daily. May add additional tablet as needed 08/27/23   Pickenpack-Cousar, Fannie SAILOR, NP  sodium chloride  1 g tablet Take 1 tablet (1 g total) by mouth 3 (three) times daily with meals. 08/06/23   Dorsey, John T IV, MD  thiamine  100 MG tablet Take 1 tablet (100 mg total) by mouth daily. 05/08/21   Rai, Nydia POUR, MD  traMADol  (ULTRAM ) 50 MG tablet Take 1-2 tablets (50-100 mg total) by mouth every 6 (six) hours as needed. 09/05/23   Thayil, Irene T, PA-C    Allergies: Morphine  and Oxycodone     Review of Systems  Gastrointestinal:  Positive for constipation.    Updated Vital Signs BP 100/77 (BP Location: Left Arm)   Pulse (!) 118   Temp  98.7 F (37.1 C) (Oral)   Resp 16   SpO2 97%   Physical Exam Vitals and nursing note reviewed.  HENT:     Head: Normocephalic and atraumatic.  Eyes:     Pupils: Pupils are equal, round, and reactive to light.  Cardiovascular:     Rate and Rhythm: Normal rate and regular rhythm.  Pulmonary:     Effort: Pulmonary effort is normal.     Breath sounds: Normal breath sounds.  Abdominal:     Tenderness: There is no abdominal tenderness.     Comments: Firm abdomen without rebound rigidity or guarding Nontender  Skin:    General: Skin is warm and dry.  Neurological:     Mental Status: He is alert.  Psychiatric:        Mood and Affect: Mood normal.     (all labs ordered are listed, but only abnormal results are displayed) Labs Reviewed  COMPREHENSIVE METABOLIC PANEL WITH GFR - Abnormal; Notable for the following components:      Result Value   Sodium 130 (*)    Chloride 95 (*)    Glucose, Bld 108 (*)    Total Protein 8.3 (*)    All other components within normal limits  CBC WITH DIFFERENTIAL/PLATELET - Abnormal; Notable for the following components:   WBC 12.3 (*)    RBC 4.03 (*)    Hemoglobin 10.6 (*)    HCT 34.2 (*)    RDW 18.6 (*)    Neutro Abs 9.7 (*)    All other components within normal limits    EKG: None  Radiology: DG Abd Portable 1 View Result Date: 09/12/2023 CLINICAL DATA:  Constipation. EXAM: PORTABLE ABDOMEN - 1 VIEW COMPARISON:  CT dated 07/16/2023. FINDINGS: Dilated small bowel loops measure up to 3.7 cm and may represent obstruction or ileus. There is moderate stool throughout the colon. No free air. Degenerative changes of the spine and hips. No acute osseous pathology. Poorly visualized bilateral pulmonary nodules. IMPRESSION: 1. Small bowel obstruction versus ileus. 2. Moderate colonic stool burden. Electronically Signed   By: Vanetta Chou M.D.   On: 09/12/2023 19:56     .Fecal disimpaction  Date/Time: 09/12/2023 10:23 PM  Performed by: Pamella Ozell LABOR, DO Authorized by: Pamella Ozell LABOR, DO  Consent: Verbal consent obtained Consent given by: patient Patient identity confirmed: verbally with patient Local anesthesia used: no  Anesthesia: Local anesthesia used: no  Sedation: Patient sedated: no  Patient tolerance: patient tolerated the procedure well with no immediate complications      Medications Ordered in the ED  enoxaparin  (LOVENOX ) injection 40 mg (has no administration in time range)  acetaminophen  (TYLENOL ) tablet 650 mg (has no administration in time range)    Or  acetaminophen  (TYLENOL ) suppository 650 mg (has no administration in time range)  ondansetron  (ZOFRAN ) tablet 4 mg (has no administration in time range)    Or  ondansetron  (ZOFRAN ) injection 4 mg (has no administration in time range)  pantoprazole  (PROTONIX ) injection 40 mg (has no administration in time range)  ipratropium-albuterol  (DUONEB) 0.5-2.5 (3) MG/3ML nebulizer solution 3 mL (has no administration in time range)  lidocaine  (LIDODERM ) 5 % 1 patch (has no administration in time range)  sodium chloride  0.9 % bolus 1,000 mL (has no administration in time range)  albuterol  (PROVENTIL ) (2.5 MG/3ML) 0.083% nebulizer solution 2.5 mg (has no administration in time range)  lactated ringers  infusion (has no administration in time range)  traMADol  (ULTRAM ) tablet 50 mg (has no administration in time range)    Clinical Course as of 09/12/23 2224  Wed Sep 12, 2023  2011 X-rays concerning for SBO versus ileus.  Will obtain laboratory workup and reach out to general surgery.  Bladder scan did show 538 cc of urine within the bladder however patient declined offer of catheter at this time [MP]  2147 Discussed with general surgery Dr. Aron.  She agrees with plan for conservative management with NG tube and will round on the patient the morning.  Discussed admitting hospitalist accepts patient for admission  Patient is amenable to Foley catheter after  further discussion but wants to wait on NG tube placement. [MP]  2222 Patient reported to me that he felt as though he needed to have a bowel movement but was unable to push and felt as though there was fecal impaction.  I performed fecal impaction and was able to remove several hard stool balls within the rectal vault.  There is an external hemorrhoid noted that does not appear thrombosed.  Patient tolerated procedure well. [MP]    Clinical Course User Index [MP] Pamella Ozell LABOR, DO                                 Medical Decision Making 68 year old male with history as above presenting to the ED for concern of constipation.  Abdominal pain constipation for 2 days.  Last bowel movement 2 days ago.  He is still passing gas.  Afebrile tachycardic.  No nausea vomiting.  No significant abdominal tenderness although abdomen is firm.  Differential diagnosis includes slow transit constipation versus bowel obstruction versus ileus.  Will start with abdominal x-ray  Amount and/or Complexity of Data Reviewed Labs: ordered. Radiology: ordered.  Risk Decision regarding hospitalization.        Final diagnoses:  Small bowel obstruction [facility])    ED Discharge Orders     None          Pamella Ozell LABOR, DO 09/12/23 2149    Pamella Ozell LABOR, DO 09/12/23 2224

## 2023-09-12 NOTE — Hospital Course (Addendum)
 Terry Page. is a 68 y.o. 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  He passed gas earlier today and still passing.Has been having worsening constipation despite taking senna regularly. Recent oncology visit on 09/05/23- gemcitabine  held due to progression of the disease on CT scan from 07/16/2023 and he has progressed on third line therapy no targetable mutation. Patient otherwise denies any nausea, vomiting, chest pain, shortness of breath, fever, chills, headache, focal weakness, numbness tingling, speech difficulties  In ED: Heart rate 118 BP stable afebrile on room air.  Labs were obtained BMP pending CBC with similar leukocytosis 12.3 hemoglobin 10.6. X-ray abdomen> small bowel obstruction versus ileus moderate colonic stool burden General Surgery consulted and [facility] was consulted for admission  Assessment and plan:  Ileus versus constipation Moderate colonic stool burden Rectal pain: Appreciate general surgery input, NG tube has been ordered in ED and patient refused NG. No vomiting for now so holding off NGT. Continue n.p.o., IV fluid hydration, cont pain management, cont  supportive care as per surgery, add bowel regimen PR. He is requesting manual disimpaction  Chronic right hip pain Low back pain: Cont pain management  Hyponatremia: Cont ivf  Iron deficiency anemia Chemo-induced anemia: Monitor hb  Metastatic adenocarcinoma of the lung: His disease has progressed despite getting third line agent.  Will benefit with palliative care evaluation at some point He is due to see Dr Sherrod on coming Wednesday- we can try to touch base with oncology while pt is admitted.  Prostate cancer hx   DVT prophylaxis:   Code Status:   Code Status: Prior  Objective: Vitals last 24 hrs: Vitals:   09/12/23 1850  BP: 100/77  Pulse: (!) 118  Resp: 16  Temp: 98.7 F (37.1 C)  TempSrc: Oral  SpO2: 97%   Physical Examination: General exam: alert awake, oriented, older than stated age HEENT:Oral mucosa moist, Ear/Nose WNL grossly Respiratory system: Bilaterally clear BS,no use of accessory muscle Cardiovascular system: S1 & S2 +, No JVD. Gastrointestinal system: Abdomen soft, non tender ,ND, BS+ Nervous System: Alert, awake, moving all extremities,and following commands. Extremities: extremities warm, leg edema neg Skin: No rashes,no icterus. MSK: Normal muscle bulk,tone, power   Medications reviewed:  Scheduled Meds: Continuous Infusions: Diet: Diet Order             Diet NPO time specified  Diet effective now

## 2023-09-13 DIAGNOSIS — K567 Ileus, unspecified: Secondary | ICD-10-CM | POA: Diagnosis not present

## 2023-09-13 MED ORDER — SENNOSIDES-DOCUSATE SODIUM 8.6-50 MG PO TABS
2.0000 | ORAL_TABLET | Freq: Two times a day (BID) | ORAL | Status: DC
  Administered 2023-09-13 – 2023-09-17 (×9): 2 via ORAL
  Filled 2023-09-13 (×9): qty 2

## 2023-09-13 MED ORDER — SODIUM CHLORIDE 0.9% FLUSH
10.0000 mL | Freq: Two times a day (BID) | INTRAVENOUS | Status: DC
  Administered 2023-09-13 – 2023-09-14 (×2): 10 mL
  Administered 2023-09-15: 20 mL
  Administered 2023-09-15 – 2023-09-16 (×2): 10 mL
  Administered 2023-09-16: 20 mL
  Administered 2023-09-17: 10 mL

## 2023-09-13 MED ORDER — HYDROMORPHONE HCL 1 MG/ML IJ SOLN: 0.5000 mg | INTRAMUSCULAR | Status: AC | PRN

## 2023-09-13 MED ORDER — ASPIRIN 81 MG PO TBEC
81.0000 mg | DELAYED_RELEASE_TABLET | Freq: Every day | ORAL | Status: DC
Start: 2023-09-13 — End: 2023-09-17
  Administered 2023-09-13 – 2023-09-17 (×5): 81 mg via ORAL
  Filled 2023-09-13 (×5): qty 1

## 2023-09-13 MED ORDER — LACTULOSE 10 GM/15ML PO SOLN
20.0000 g | Freq: Two times a day (BID) | ORAL | Status: DC
  Administered 2023-09-13 – 2023-09-15 (×4): 20 g via ORAL
  Filled 2023-09-13 (×8): qty 30

## 2023-09-13 MED ORDER — HYDROXYZINE HCL 25 MG PO TABS: 25.0000 mg | ORAL_TABLET | Freq: Every evening | ORAL | Status: DC | PRN

## 2023-09-13 MED ORDER — CHLORHEXIDINE GLUCONATE CLOTH 2 % EX PADS
6.0000 | MEDICATED_PAD | Freq: Every day | CUTANEOUS | Status: DC
  Administered 2023-09-13 – 2023-09-17 (×5): 6 via TOPICAL

## 2023-09-13 MED ORDER — HYDROMORPHONE HCL 1 MG/ML IJ SOLN
0.5000 mg | INTRAMUSCULAR | Status: AC | PRN
  Administered 2023-09-13 (×2): 0.5 mg via INTRAVENOUS
  Filled 2023-09-13 (×2): qty 0.5

## 2023-09-13 MED ORDER — SODIUM CHLORIDE 0.9% FLUSH: 10.0000 mL | INTRAVENOUS | Status: DC | PRN

## 2023-09-13 MED ORDER — MIRTAZAPINE 15 MG PO TABS
7.5000 mg | ORAL_TABLET | Freq: Every day | ORAL | Status: DC
  Administered 2023-09-13 – 2023-09-16 (×4): 7.5 mg via ORAL
  Filled 2023-09-13 (×4): qty 1

## 2023-09-13 NOTE — Progress Notes (Signed)
 PROGRESS NOTE    Terry Page.  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  He is taking Senokot at home without relief.  Patient was without any nausea vomiting.  In the emergency room hemodynamically stable.  KUB with dilated bowel loops small bowel obstruction versus ileus.  Colonic stool burden.  Admitted due to significant symptoms.  Subjective: Patient seen and examined in the morning rounds.  On my exam, he did not have any bowel movements.  Patient denied any nausea vomiting or abdominal pain.  He was just wishing to have some bowel movements. Abdomen exam was benign.  Given a dose of lactulose , clears.  Patient immediately had a large bowel movement.   Assessment & Plan:   Idiopathic constipation likely secondary to chronic pain, rectal pain: Patient has adequate bowel function and no evidence of intra-abdominal complications. Manual evacuation in the ER. Senokot twice daily  lactulose  20 g twice daily, 1 dose of soapsuds enema, mobilize in the hallway.  Continue IV fluids.  Continue tramadol  and use as less as possible. Case discussed with surgery, currently patient does not have any surgical need.  Will attempt aggressive bowel regimen. Patient also needs a good bowel regimen at home, he only takes stool softener.  Metastatic adenocarcinoma of the lung: With progressive metastatic disease.  Has outpatient follow-up with palliative and oncology.    DVT prophylaxis: enoxaparin  (LOVENOX ) injection 40 mg Start: 09/13/23 1000   Code Status: Full code Family Communication: None at the bedside Disposition Plan: Status is: Inpatient Remains inpatient appropriate because: Significant abdominal  pain, unable to relieve constipation     Consultants:  None  Procedures:  None  Antimicrobials:  None     Objective: Vitals:   09/12/23 2247 09/13/23 0104 09/13/23 0537 09/13/23 0928  BP: (!) 168/76 128/83 (!) 181/96 (!) 184/99  Pulse: 89 93 97 (!) 107  Resp: 18 18 18 14   Temp: 98.1 F (36.7 C) 98 F (36.7 C) 98.2 F (36.8 C) 98 F (36.7 C)  TempSrc: Oral Oral Oral Oral  SpO2: 100% 100% 98% 96%    Intake/Output Summary (Last 24 hours) at 09/13/2023 1129 Last data filed at 09/13/2023 0827 Gross per 24 hour  Intake 753.33 ml  Output 2650 ml  Net -1896.67 ml   There were no vitals filed for this visit.  Examination:  General exam: Appears calm and comfortable.  Mildly anxious. Respiratory system: Clear to auscultation. Respiratory effort normal. Cardiovascular system: S1 & S2 heard, RRR. No JVD, murmurs, rubs, gallops or clicks. No pedal edema. Gastrointestinal system: Abdomen is nondistended, soft and nontender. No organomegaly or masses felt. Normal bowel sounds heard. Central nervous system: Alert and oriented. No focal neurological deficits.      Data Reviewed: I have personally reviewed following labs and imaging studies  CBC: Recent Labs  Lab 09/12/23 2056  WBC 12.3*  NEUTROABS 9.7*  HGB 10.6*  HCT 34.2*  MCV 84.9  PLT 345   Basic Metabolic Panel: Recent Labs  Lab 09/12/23 2056  NA 130*  K 4.4  CL 95*  CO2 22  GLUCOSE 108*  BUN 16  CREATININE 0.85  CALCIUM  10.3   GFR: Estimated Creatinine Clearance: 68.3 mL/min (by C-G formula based on SCr of  0.85 mg/dL). Liver Function Tests: Recent Labs  Lab 09/12/23 2056  AST 20  ALT 9  ALKPHOS 78  BILITOT 0.5  PROT 8.3*  ALBUMIN 3.8   No results for input(s): LIPASE, AMYLASE in the last 168 hours. No results for input(s): AMMONIA in the last 168 hours. Coagulation Profile: No results for input(s): INR, PROTIME in the last 168 hours. Cardiac Enzymes: No results for input(s):  CKTOTAL, CKMB, CKMBINDEX, TROPONINI in the last 168 hours. BNP (last 3 results) No results for input(s): PROBNP in the last 8760 hours. HbA1C: No results for input(s): HGBA1C in the last 72 hours. CBG: No results for input(s): GLUCAP in the last 168 hours. Lipid Profile: No results for input(s): CHOL, HDL, LDLCALC, TRIG, CHOLHDL, LDLDIRECT in the last 72 hours. Thyroid  Function Tests: No results for input(s): TSH, T4TOTAL, FREET4, T3FREE, THYROIDAB in the last 72 hours. Anemia Panel: No results for input(s): VITAMINB12, FOLATE, FERRITIN, TIBC, IRON, RETICCTPCT in the last 72 hours. Sepsis Labs: No results for input(s): PROCALCITON, LATICACIDVEN in the last 168 hours.  No results found for this or any previous visit (from the past 240 hours).       Radiology Studies: DG Abd Portable 1 View Result Date: 09/12/2023 CLINICAL DATA:  Constipation. EXAM: PORTABLE ABDOMEN - 1 VIEW COMPARISON:  CT dated 07/16/2023. FINDINGS: Dilated small bowel loops measure up to 3.7 cm and may represent obstruction or ileus. There is moderate stool throughout the colon. No free air. Degenerative changes of the spine and hips. No acute osseous pathology. Poorly visualized bilateral pulmonary nodules. IMPRESSION: 1. Small bowel obstruction versus ileus. 2. Moderate colonic stool burden. Electronically Signed   By: Vanetta Chou M.D.   On: 09/12/2023 19:56        Scheduled Meds:  aspirin  EC  81 mg Oral Daily   Chlorhexidine  Gluconate Cloth  6 each Topical Daily   enoxaparin  (LOVENOX ) injection  40 mg Subcutaneous Q24H   lactulose   20 g Oral BID   lidocaine   1 patch Transdermal Q24H   mirtazapine   7.5 mg Oral QHS   pantoprazole  (PROTONIX ) IV  40 mg Intravenous Q24H   senna-docusate  2 tablet Oral BID   sodium chloride  flush  10-40 mL Intracatheter Q12H   Continuous Infusions:  lactated ringers  100 mL/hr at 09/12/23 2301     LOS: 1 day     Time spent: 51 minutes    Renato Applebaum, MD [facility]

## 2023-09-13 NOTE — TOC Initial Note (Signed)
 Transition of Care (TOC) - Initial/Assessment Note    Patient Details  Name: Terry Page. MRN: [id number] Date of Birth: [birth year]  [facility]) CM/SW Contact:    Alfonse JONELLE Rex, RN Phone Number: 09/13/2023, 12:29 PM  Clinical Narrative:    Met with patient at bedside to introduce role of TOC/NCM and review for dc planning, patient confirmed he has an established PCP, no current home care services. Home DME: Roll[employer], Cane, [facility], patient reports he lives alone with  good support from family.  PT eval pending, await recommendation. IP Care Management will continue to follow.               Expected Discharge Plan: Home w Home Health Services Barriers to Discharge: Continued Medical Work up   Patient Goals and CMS Choice Patient states their goals for this hospitalization and ongoing recovery are:: return home          Expected Discharge Plan and Services       Living arrangements for the past 2 months: Apartment                                      Prior Living Arrangements/Services Living arrangements for the past 2 months: Apartment Lives with:: Self   Do you feel safe going back to the place where you live?: Yes      Need for Family Participation in Patient Care: Yes (Comment) Care giver support system in place?: Yes (comment) Current home services: DME (Rollator, Cane, [facility]) Criminal Activity/Legal Involvement Pertinent to Current Situation/Hospitalization: No - Comment as needed  Activities of Daily Living   ADL Screening (condition at time of admission) Independently performs ADLs?: Yes (appropriate for developmental age) Is the patient deaf or have difficulty hearing?: No Does the patient have difficulty seeing, even when wearing glasses/contacts?: No Does the patient have difficulty concentrating, remembering, or making decisions?: No  Permission Sought/Granted                  Emotional Assessment Appearance:: Appears  stated age Attitude/Demeanor/Rapport: Engaged Affect (typically observed): Accepting Orientation: : Oriented to Self, Oriented to Place, Oriented to  Time, Oriented to Situation Alcohol / Substance Use: Not Applicable Psych Involvement: No (comment)  Admission diagnosis:  Ileus ([name]) [K56.7] Small bowel obstruction ([name]) [K56.609] Patient Active Problem List   Diagnosis Date Noted   Ileus ([name]) 09/12/2023   AMS (altered mental status) 04/22/2023   Pressure injury of skin 04/22/2023   Wound eschar of foot 04/21/2023   Stroke ([name]) 04/21/2023   CVA (cerebral vascular accident) ([name]) 04/20/2023   Goals of care, counseling/discussion 11/24/2022   Pain due to onychomycosis of toenails of both feet 04/19/2022   Claudication ([name]) 01/13/2022   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   Port-A-Cath in place 07/08/2021   Hyperkalemia 04/28/2021   Elevated LFTs 04/28/2021   Falls 04/28/2021   Chronic back pain 04/28/2021   Hemoptysis 05/18/2020   Protein-calorie malnutrition, severe 05/07/2020   Abdominal pain    Necrotizing pneumonia ([name]) 05/03/2020   Bullous emphysema ([name])    Acute respiratory failure with hypoxia ([name]) 04/12/2020   Pneumonia due to COVID-19 virus 04/10/2020   Primary cancer of left upper lobe of lung ([name]) 04/10/2020   Cavitary pneumonia 04/10/2020   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   Essential hypertension 04/10/2020   HLD (hyperlipidemia)  04/10/2020   Sepsis ([name]) 04/10/2020   Recurrent spontaneous pneumothorax 06/01/2019   Malignant neoplasm of prostate ([name]) 05/20/2019   Hyponatremia 12/04/2017   AKI (acute kidney injury) ([name]) 12/04/2017   Incidental lung nodule, greater than or equal to 8mm 10/22/2017   COPD with acute exacerbation ([name])    Spontaneous pneumothorax 10/20/2017   Tobacco abuse    Alcohol abuse    PCP:  Rolinda Millman, MD Pharmacy:   [facility] [phone] [address], [place] -  [address] AT [facility] [address] [place] [address] Phone: [phone] Fax: [phone]  [place] - [facility] [address] [place] [address] Phone: [phone] Fax: [phone]     Social Drivers of Health (SDOH) Social History: SDOH Screenings   Food Insecurity: No Food Insecurity (09/12/2023)  Housing: Low Risk  (09/12/2023)  Transportation Needs: No Transportation Needs (09/12/2023)  Utilities: Not At Risk (09/12/2023)  Depression (PHQ2-9): Low Risk  (08/03/2023)  Social Connections: Moderately Integrated (09/12/2023)  Tobacco Use: Medium Risk (08/28/2023)   SDOH Interventions:     Readmission Risk Interventions    09/13/2023   12:28 PM 04/29/2021    2:00 PM  Readmission Risk Prevention Plan  Transportation Screening Complete Complete  PCP or Specialist Appt within 5-7 Days  Complete  PCP or Specialist Appt within 3-5 Days Complete   Home Care Screening  Complete  Medication Review (RN CM)  Complete  HRI or Home Care Consult Complete   Palliative Care Screening Not Applicable   Medication Review (RN Care Manager) Complete

## 2023-09-13 NOTE — Plan of Care (Signed)

## 2023-09-14 DIAGNOSIS — K567 Ileus, unspecified: Secondary | ICD-10-CM | POA: Diagnosis not present

## 2023-09-14 LAB — BASIC METABOLIC PANEL WITH GFR
Anion gap: 12 (ref 5–15)
BUN: 13 mg/dL (ref 8–23)
CO2: 23 mmol/L (ref 22–32)
Calcium: 9.6 mg/dL (ref 8.9–10.3)
Chloride: 97 mmol/L — ABNORMAL LOW (ref 98–111)
Creatinine, Ser: 0.79 mg/dL (ref 0.61–1.24)
GFR, Estimated: >60 mL/min (ref >60)
Glucose, Bld: 112 mg/dL — ABNORMAL HIGH (ref 70–99)
Potassium: 3.1 mmol/L — ABNORMAL LOW (ref 3.5–5.1)
Sodium: 131 mmol/L — ABNORMAL LOW (ref 135–145)

## 2023-09-14 LAB — CBC
HCT: 33 % — ABNORMAL LOW (ref 39.0–52.0)
Hemoglobin: 10.3 g/dL — ABNORMAL LOW (ref 13.0–17.0)
MCH: 26.3 pg (ref 26.0–34.0)
MCHC: 31.2 g/dL (ref 30.0–36.0)
MCV: 84.4 fL (ref 80.0–100.0)
Platelets: 345 K/uL (ref 150–400)
RBC: 3.91 MIL/uL — ABNORMAL LOW (ref 4.22–5.81)
RDW: 18.7 % — ABNORMAL HIGH (ref 11.5–15.5)
WBC: 12.8 K/uL — ABNORMAL HIGH (ref 4.0–10.5)
nRBC: 0 % (ref 0.0–0.2)

## 2023-09-14 MED ORDER — SODIUM CHLORIDE 0.9 % IV SOLN: INTRAVENOUS | Status: DC

## 2023-09-14 MED ORDER — PROCHLORPERAZINE EDISYLATE 10 MG/2ML IJ SOLN
10.0000 mg | Freq: Four times a day (QID) | INTRAMUSCULAR | Status: DC | PRN
  Administered 2023-09-14: 10 mg via INTRAVENOUS
  Filled 2023-09-14: qty 2

## 2023-09-14 MED ORDER — LACTATED RINGERS IV SOLN: INTRAVENOUS | Status: AC

## 2023-09-14 MED ORDER — POTASSIUM CHLORIDE CRYS ER 20 MEQ PO TBCR
20.0000 meq | EXTENDED_RELEASE_TABLET | Freq: Two times a day (BID) | ORAL | Status: DC
  Administered 2023-09-14 – 2023-09-17 (×6): 20 meq via ORAL
  Filled 2023-09-14 (×6): qty 1

## 2023-09-14 MED ORDER — POTASSIUM CHLORIDE CRYS ER 20 MEQ PO TBCR
40.0000 meq | EXTENDED_RELEASE_TABLET | Freq: Once | ORAL | Status: AC
Start: 2023-09-14 — End: 2023-09-14
  Administered 2023-09-14: 40 meq via ORAL
  Filled 2023-09-14: qty 2

## 2023-09-14 NOTE — Progress Notes (Signed)
 PROGRESS NOTE    Terry Page.  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  He is taking Senokot at home without relief.  Patient was without any nausea vomiting.  In the emergency room hemodynamically stable.  KUB with dilated bowel loops small bowel obstruction versus ileus.  Colonic stool burden.  Admitted due to significant symptoms.  Subjective: Patient seen and examined . Had multiple BM last night . Started getting nausea since morning and could not tolerate diet intake . Denies any abdominal pain or distension but is nauseated .    Assessment & Plan:   Idiopathic constipation likely secondary to chronic pain, rectal pain: recurrent nausea .  Nausea despite improvement of bowel function.  Manual evacuation in the ER. Senokot twice daily  lactulose  20 g twice daily, mobilize in the hallway.  Continue IV fluids.  Continue tramadol  and use as less as possible. Case discussed with surgery, currently patient does not have any surgical need.  Will attempt aggressive bowel regimen. Patient also needs a good bowel regimen at home, he only takes stool softener. With recurrent nausea , repeat CT scan abdomen pelvis with contrast today - scale back to liquid diet  Metastatic adenocarcinoma of the lung: With progressive metastatic disease.  Has outpatient follow-up with palliative and oncology.  Hypokalemia: replaced.     DVT prophylaxis: enoxaparin  (LOVENOX ) injection 40 mg Start: 09/13/23 1000   Code Status: Full code Family Communication: None at the bedside Disposition Plan: Status is: Inpatient Remains inpatient appropriate because: Significant abdominal pain, unable to relieve constipation      Consultants:  None  Procedures:  None  Antimicrobials:  None     Objective: Vitals:   09/13/23 1646 09/13/23 2154 09/14/23 0527 09/14/23 1319  BP: (!) 158/96 126/85 (!) 154/90 (!) 149/99  Pulse: (!) 107 (!) 107 98 82  Resp: 16 16 17 15   Temp: 98.1 F (36.7 C) 97.9 F (36.6 C) 98 F (36.7 C) 97.6 F (36.4 C)  TempSrc: Oral Oral Oral   SpO2: 98% 95% 97% 98%    Intake/Output Summary (Last 24 hours) at 09/14/2023 1435 Last data filed at 09/14/2023 0600 Gross per 24 hour  Intake 906.67 ml  Output 1700 ml  Net -793.33 ml   There were no vitals filed for this visit.  Examination:  General exam: Appears calm. Slightly anxious.  Respiratory system: Clear to auscultation. Respiratory effort normal. Cardiovascular system: S1 & S2 heard, RRR. No JVD, murmurs, rubs, gallops or clicks. No pedal edema. Gastrointestinal system: Abdomen is nondistended, soft and nontender. No organomegaly or masses felt. Normal bowel sounds heard. Central nervous system: Alert and oriented. No focal neurological deficits.      Data Reviewed: I have personally reviewed following labs and imaging studies  CBC: Recent Labs  Lab 09/12/23 2056 09/14/23 0109  WBC 12.3* 12.8*  NEUTROABS 9.7*  --   HGB 10.6* 10.3*  HCT 34.2* 33.0*  MCV 84.9 84.4  PLT 345 345   Basic Metabolic Panel: Recent Labs  Lab 09/12/23 2056 09/14/23 0109  NA 130* 131*  K 4.4 3.1*  CL 95* 97*  CO2 22 23  GLUCOSE 108* 112*  BUN 16 13  CREATININE 0.85 0.79  CALCIUM  10.3 9.6  GFR: Estimated Creatinine Clearance: 72.6 mL/min (by C-G formula based on SCr of 0.79 mg/dL). Liver Function Tests: Recent Labs  Lab 09/12/23 2056  AST 20  ALT 9  ALKPHOS 78  BILITOT 0.5  PROT 8.3*  ALBUMIN 3.8   No results for input(s): LIPASE, AMYLASE in the last 168 hours. No results for input(s): AMMONIA in the last 168 hours. Coagulation Profile: No results for input(s): INR, PROTIME in the last 168  hours. Cardiac Enzymes: No results for input(s): CKTOTAL, CKMB, CKMBINDEX, TROPONINI in the last 168 hours. BNP (last 3 results) No results for input(s): PROBNP in the last 8760 hours. HbA1C: No results for input(s): HGBA1C in the last 72 hours. CBG: No results for input(s): GLUCAP in the last 168 hours. Lipid Profile: No results for input(s): CHOL, HDL, LDLCALC, TRIG, CHOLHDL, LDLDIRECT in the last 72 hours. Thyroid  Function Tests: No results for input(s): TSH, T4TOTAL, FREET4, T3FREE, THYROIDAB in the last 72 hours. Anemia Panel: No results for input(s): VITAMINB12, FOLATE, FERRITIN, TIBC, IRON, RETICCTPCT in the last 72 hours. Sepsis Labs: No results for input(s): PROCALCITON, LATICACIDVEN in the last 168 hours.  No results found for this or any previous visit (from the past 240 hours).       Radiology Studies: DG Abd Portable 1 View Result Date: 09/12/2023 CLINICAL DATA:  Constipation. EXAM: PORTABLE ABDOMEN - 1 VIEW COMPARISON:  CT dated 07/16/2023. FINDINGS: Dilated small bowel loops measure up to 3.7 cm and may represent obstruction or ileus. There is moderate stool throughout the colon. No free air. Degenerative changes of the spine and hips. No acute osseous pathology. Poorly visualized bilateral pulmonary nodules. IMPRESSION: 1. Small bowel obstruction versus ileus. 2. Moderate colonic stool burden. Electronically Signed   By: Vanetta Chou M.D.   On: 09/12/2023 19:56        Scheduled Meds:  aspirin  EC  81 mg Oral Daily   Chlorhexidine  Gluconate Cloth  6 each Topical Daily   enoxaparin  (LOVENOX ) injection  40 mg Subcutaneous Q24H   lactulose   20 g Oral BID   lidocaine   1 patch Transdermal Q24H   mirtazapine   7.5 mg Oral QHS   potassium chloride   20 mEq Oral BID   senna-docusate  2 tablet Oral BID   sodium chloride  flush  10-40 mL Intracatheter Q12H   Continuous Infusions:  sodium chloride        LOS: 2  days    Time spent: 51 minutes    Renato Applebaum, MD [facility]

## 2023-09-14 NOTE — Plan of Care (Signed)
   Problem: Education: Goal: Knowledge of General Education information will improve Description Including pain rating scale, medication(s)/side effects and non-pharmacologic comfort measures Outcome: Progressing

## 2023-09-14 NOTE — Evaluation (Signed)
 Physical Therapy Evaluation Patient Details Name: Terry Page. MRN: [id number] DOB: 05-10-1955 Today's Date: 09/14/2023  History of Present Illness  Pt is 68 yo male admitted on 09/14/23 with idiopathic constipation likely secondary to chronic pain and rectal pain.  No surgical need at this time and being managed with bowel regimen.  Pt with hx including metastatic adenocarcinoma of lung with progressive metastatic disease, prostate CA, back sx  Clinical Impression  Pt admitted with above diagnosis. At baseline , pt independent with adls, iadls, and ambulatory in community with a cane - however reports has been getting weaker and more difficult to complete all task.  Today, he needed supervision for transfers and CGA to ambulate 10' but fatigued easily and with HR up to 142 bpm with short distance ambulation.  Denies any lightheadedness or dizziness.  Pt will benefit from ongoing acute PT and additionally HHPT to address worsening weakness/endurance.  He also had questions about HH aides - notified TOC.  Pt currently with functional limitations due to the deficits listed below (see PT Problem List). Pt will benefit from acute skilled PT to increase their independence and safety with mobility to allow discharge.           If plan is discharge home, recommend the following: A little help with walking and/or transfers;A little help with bathing/dressing/bathroom;Assistance with cooking/housework;Help with stairs or ramp for entrance   Can travel by private vehicle        Equipment Recommendations None recommended by PT  Recommendations for Other Services       Functional Status Assessment Patient has had a recent decline in their functional status and demonstrates the ability to make significant improvements in function in a reasonable and predictable amount of time.     Precautions / Restrictions Precautions Precautions: Fall      Mobility  Bed Mobility Overal bed mobility: Needs  Assistance Bed Mobility: Supine to Sit, Sit to Supine     Supine to sit: Supervision Sit to supine: Supervision        Transfers Overall transfer level: Needs assistance Equipment used: Rolling walker (2 wheels) Transfers: Sit to/from Stand Sit to Stand: Contact guard assist                Ambulation/Gait Ambulation/Gait assistance: Contact guard assist Gait Distance (Feet): 70 Feet Assistive device: Rolling walker (2 wheels) Gait Pattern/deviations: Step-to pattern, Decreased stride length Gait velocity: decreased     General Gait Details: fatigued easily but was steady on his feet; HR 80's rest and up to 142 bpm walking short distance - recovered in 2 mins  Stairs            Wheelchair Mobility     Tilt Bed    Modified Rankin (Stroke Patients Only)       Balance Overall balance assessment: Needs assistance Sitting-balance support: No upper extremity supported Sitting balance-Leahy Scale: Good     Standing balance support: Bilateral upper extremity supported, No upper extremity supported Standing balance-Leahy Scale: Fair Standing balance comment: RW to ambulate but could stand without AD                             Pertinent Vitals/Pain Pain Assessment Pain Assessment: Faces Faces Pain Scale: Hurts a little bit Pain Location: generalized Pain Descriptors / Indicators: Discomfort Pain Intervention(s): Limited activity within patient's tolerance, Monitored during session    Home Living Family/patient expects to be discharged to::  Private residence Living Arrangements: Alone Available Help at Discharge: Family;Available PRN/intermittently (ex wife and dtr live around corner) Type of Home: Apartment Home Access: Level entry       Home Layout: One level Home Equipment: Cane - single point;Tub bench;Rollator (4 wheels)      Prior Function               Mobility Comments: Ambulates mostly with cane; could ambulate in  community ADLs Comments: Independent with ADLs and IADLs; does not drive     Extremity/Trunk Assessment   Upper Extremity Assessment Upper Extremity Assessment: Defer to OT evaluation    Lower Extremity Assessment Lower Extremity Assessment: Overall WFL for tasks assessed (ROM WFL; MMT 5/5)    Cervical / Trunk Assessment Cervical / Trunk Assessment: Normal  Communication        Cognition Arousal: Alert Behavior During Therapy: WFL for tasks assessed/performed   PT - Cognitive impairments: No apparent impairments                                 Cueing       General Comments General comments (skin integrity, edema, etc.): Pt called wife Claris and asked therapy to let her know how he did and of recommendations for HHPT -she is in agreement as he has been gradually getting weaker and having more difficulty with task/adls at home.  They also are requesting HH aide to assist with IADLs -informed them I would let TOC know and they would follow up about the process    Exercises     Assessment/Plan    PT Assessment Patient needs continued PT services  PT Problem List Decreased strength;Decreased mobility;Decreased range of motion;Decreased activity tolerance;Decreased balance;Decreased knowledge of use of DME       PT Treatment Interventions DME instruction;Gait training;Stair training;Functional mobility training;Therapeutic activities;Patient/family education;Therapeutic exercise;Balance training    PT Goals (Current goals can be found in the Care Plan section)  Acute Rehab PT Goals Patient Stated Goal: return home PT Goal Formulation: With patient/family Time For Goal Achievement: 09/28/23 Potential to Achieve Goals: Good    Frequency Min 1X/week     Co-evaluation               AM-PAC PT 6 Clicks Mobility  Outcome Measure Help needed turning from your back to your side while in a flat bed without using bedrails?: None Help needed moving from  lying on your back to sitting on the side of a flat bed without using bedrails?: None Help needed moving to and from a bed to a chair (including a wheelchair)?: A Little Help needed standing up from a chair using your arms (e.g., wheelchair or bedside chair)?: A Little Help needed to walk in hospital room?: A Little Help needed climbing 3-5 steps with a railing? : A Little 6 Click Score: 20    End of Session Equipment Utilized During Treatment: Gait belt Activity Tolerance: Patient limited by fatigue Patient left: in bed;with call bell/phone within reach;with bed alarm set Nurse Communication: Mobility status PT Visit Diagnosis: Other abnormalities of gait and mobility (R26.89);Muscle weakness (generalized) (M62.81)    Time: 8851-8792 PT Time Calculation (min) (ACUTE ONLY): 19 min   Charges:   PT Evaluation $PT Eval Low Complexity: 1 Low   PT General Charges $$ ACUTE PT VISIT: 1 Visit         Benjiman, PT Acute Rehab [facility] [phone]  Benjiman VEAR [place] 09/14/2023, 12:42 PM

## 2023-09-14 NOTE — Evaluation (Signed)
 Occupational Therapy Evaluation Patient Details Name: Terry Page. MRN: [id number] DOB: Aug 13, 1955 Today's Date: 09/14/2023   History of Present Illness   Pt is 68 yo male admitted on 09/14/23 with idiopathic constipation likely secondary to chronic pain and rectal pain.  No surgical need at this time and being managed with bowel regimen.  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 Currently, patient presents with deficits outlined below (see OT Problem List for details) most significantly pain, balance and activity tolerance deficits limiting BADL's and functional mobility performance. Patient requires continued Acute care hospital level OT services to progress safety and functional performance and allow for discharge. OT recommending HHOT services and patient requesting support for personal care assistance resources to supplement family assist and team made aware.       If plan is discharge home, recommend the following:   A little help with walking and/or transfers;A little help with bathing/dressing/bathroom;Assistance with cooking/housework;Help with stairs or ramp for entrance;Assist for transportation     Functional Status Assessment   Patient has had a recent decline in their functional status and demonstrates the ability to make significant improvements in function in a reasonable and predictable amount of time.     Equipment Recommendations   None recommended by OT      Precautions/Restrictions   Precautions Precautions: Fall Restrictions Weight Bearing Restrictions Per Provider Order: No     Mobility Bed Mobility Overal bed mobility: Needs Assistance Bed Mobility: Supine to Sit, Sit  to Supine     Supine to sit: Supervision Sit to supine: Supervision        Transfers Overall transfer level: Needs assistance Equipment used: Rolling walker (2 wheels) Transfers: Sit to/from Stand Sit to Stand: Contact guard assist           General transfer comment: increased time with cues for pacing      Balance Overall balance assessment: Needs assistance Sitting-balance support: No upper extremity supported Sitting balance-Leahy Scale: Good     Standing balance support: Bilateral upper extremity supported Standing balance-Leahy Scale: Fair Standing balance comment: RW for now vs cane                           ADL either performed or assessed with clinical judgement   ADL Overall ADL's : Needs assistance/impaired Eating/Feeding: Independent   Grooming: Wash/dry hands;Wash/dry face;Oral care;Independent;Sitting   Upper Body Bathing: Set up;Sitting   Lower Body Bathing: Supervison/ safety;Sit to/from stand   Upper Body Dressing : Set up;Sitting   Lower Body Dressing: Supervision/safety;Sit to/from stand   Toilet Transfer: Contact guard assist;Rolling walker (2 wheels);Regular Toilet   Toileting- Clothing Manipulation and Hygiene: Set up;Sit to/from stand       Functional mobility during ADLs: Contact guard assist;Rolling walker (2 wheels) General ADL Comments: pacing and 5 P's handout issued and initiated     Vision Baseline Vision/History: 0 No visual deficits;1 Wears glasses Ability to See in Adequate Light: 0 Adequate Patient Visual Report: No change from baseline Vision Assessment?: Wears glasses for reading;No apparent visual deficits            Pertinent Vitals/Pain Pain Assessment Pain Assessment: Faces Faces Pain Scale: Hurts a little bit  Pain Location: abdomen Pain Descriptors / Indicators: Discomfort Pain Intervention(s): Limited activity within patient's tolerance, Repositioned, Monitored during session      Extremity/Trunk Assessment Upper Extremity Assessment Upper Extremity Assessment: Right hand dominant;Overall WFL for tasks assessed   Lower Extremity Assessment Lower Extremity Assessment: Defer to PT evaluation   Cervical / Trunk Assessment Cervical / Trunk Assessment: Normal   Communication Communication Communication: No apparent difficulties   Cognition Arousal: Alert Behavior During Therapy: WFL for tasks assessed/performed Cognition: No apparent impairments                               Following commands: Intact       Cueing  General Comments   Cueing Techniques: Verbal cues  decreased activity tolerance with 5 P's educ and handout issued and inisitated due to limited activity tolerance   Exercises Exercises: Other exercises (L3 tband issued and trained for triceps press 10 reps x 3 sets per day)        Home Living Family/patient expects to be discharged to:: Private residence Living Arrangements: Alone Available Help at Discharge: Family;Available PRN/intermittently (dtr and wife lives around corner) Type of Home: Apartment Home Access: Level entry     Home Layout: One level     Bathroom Shower/Tub: Chief Strategy Officer: Standard Bathroom Accessibility: No   Home Equipment: Cane - single point;Tub bench;Rollator (4 wheels)   Additional Comments: dtr and son assist with groceries      Prior Functioning/Environment Prior Level of Function : Independent/Modified Independent             Mobility Comments: Ambulates mostly with cane; could ambulate in community ADLs Comments: Independent with ADLs and IADLs; does not drive    OT Problem List: Decreased activity tolerance;Impaired balance (sitting and/or standing);Pain   OT Treatment/Interventions: Self-care/ADL training;Therapeutic exercise;Energy conservation;DME and/or AE instruction;Therapeutic activities;Patient/family education;Balance training      OT  Goals(Current goals can be found in the care plan section)   Acute Rehab OT Goals Patient Stated Goal: to get stronger OT Goal Formulation: With patient Time For Goal Achievement: 09/28/23 Potential to Achieve Goals: Good ADL Goals Pt Will Perform Lower Body Bathing: with modified independence;sit to/from stand Pt Will Perform Lower Body Dressing: with modified independence;sit to/from stand Pt Will Transfer to Toilet: with modified independence;ambulating;regular height toilet;grab bars Pt Will Perform Toileting - Clothing Manipulation and hygiene: with modified independence;sit to/from stand Pt Will Perform Tub/Shower Transfer: Tub transfer;with supervision;tub bench Pt/caregiver will Perform Home Exercise Program: Increased strength;Both right and left upper extremity;Independently;With written HEP provided Additional ADL Goal #1: Patient will teach back and integ 5 Ps of ECT into ADL's and mobility with min cues   OT Frequency:  Min 2X/week       AM-PAC OT 6 Clicks Daily Activity     Outcome Measure Help from another person eating meals?: None Help from another person taking care of personal grooming?: A Little Help from another person toileting, which includes using toliet, bedpan, or urinal?: A Little Help from another person bathing (including washing, rinsing, drying)?: A Little Help from another person to put on and taking off regular upper body clothing?: A Little Help from another person to put on and taking off regular lower body clothing?: A Little 6 Click Score: 19   End of Session Equipment Utilized During Treatment: Gait belt;Rolling walker (2 wheels) Nurse Communication: Mobility status  Activity Tolerance: Patient limited by fatigue  Patient left: in bed;with call bell/phone within reach;with bed alarm set  OT Visit Diagnosis: Unsteadiness on feet (R26.81);Pain Pain - part of body:  (abdominal)                Time: 1333-1400 OT Time Calculation (min): 27  min Charges:  OT General Charges $OT Visit: 1 Visit OT Evaluation $OT Eval Low Complexity: 1 Low  Marlo Arriola OT/L Acute Rehabilitation Department  [phone]  09/14/2023, 2:10 PM

## 2023-09-15 ENCOUNTER — Inpatient Hospital Stay (HOSPITAL_COMMUNITY)

## 2023-09-15 DIAGNOSIS — K567 Ileus, unspecified: Secondary | ICD-10-CM | POA: Diagnosis not present

## 2023-09-15 LAB — COMPREHENSIVE METABOLIC PANEL WITH GFR
ALT: 7 U/L (ref 0–44)
AST: 18 U/L (ref 15–41)
Albumin: 3.3 g/dL — ABNORMAL LOW (ref 3.5–5.0)
Alkaline Phosphatase: 68 U/L (ref 38–126)
Anion gap: 12 (ref 5–15)
BUN: 11 mg/dL (ref 8–23)
CO2: 23 mmol/L (ref 22–32)
Calcium: 9.6 mg/dL (ref 8.9–10.3)
Chloride: 100 mmol/L (ref 98–111)
Creatinine, Ser: 0.82 mg/dL (ref 0.61–1.24)
GFR, Estimated: >60 mL/min (ref >60)
Glucose, Bld: 121 mg/dL — ABNORMAL HIGH (ref 70–99)
Potassium: 3.1 mmol/L — ABNORMAL LOW (ref 3.5–5.1)
Sodium: 135 mmol/L (ref 135–145)
Total Bilirubin: 0.4 mg/dL (ref 0.0–1.2)
Total Protein: 7.3 g/dL (ref 6.5–8.1)

## 2023-09-15 LAB — BLOOD GAS, ARTERIAL
Acid-Base Excess: 2.2 mmol/L — ABNORMAL HIGH (ref 0.0–2.0)
Bicarbonate: 25 mmol/L (ref 20.0–28.0)
Drawn by: 331441
FIO2: 2 %
O2 Saturation: 98.1 %
Patient temperature: 37
pCO2 arterial: 32 mmHg (ref 32–48)
pH, Arterial: 7.5 — ABNORMAL HIGH (ref 7.35–7.45)
pO2, Arterial: 89 mmHg (ref 83–108)

## 2023-09-15 LAB — GLUCOSE, CAPILLARY: Glucose-Capillary: 114 mg/dL — ABNORMAL HIGH (ref 70–99)

## 2023-09-15 LAB — CBC WITH DIFFERENTIAL/PLATELET
Abs Immature Granulocytes: 0.05 K/uL (ref 0.00–0.07)
Basophils Absolute: 0 K/uL (ref 0.0–0.1)
Basophils Relative: 0 %
Eosinophils Absolute: 0.3 K/uL (ref 0.0–0.5)
Eosinophils Relative: 2 %
HCT: 33.7 % — ABNORMAL LOW (ref 39.0–52.0)
Hemoglobin: 10.3 g/dL — ABNORMAL LOW (ref 13.0–17.0)
Immature Granulocytes: 0 %
Lymphocytes Relative: 8 %
Lymphs Abs: 1.2 K/uL (ref 0.7–4.0)
MCH: 25.9 pg — ABNORMAL LOW (ref 26.0–34.0)
MCHC: 30.6 g/dL (ref 30.0–36.0)
MCV: 84.9 fL (ref 80.0–100.0)
Monocytes Absolute: 1.2 K/uL — ABNORMAL HIGH (ref 0.1–1.0)
Monocytes Relative: 8 %
Neutro Abs: 12.3 K/uL — ABNORMAL HIGH (ref 1.7–7.7)
Neutrophils Relative %: 82 %
Platelets: 317 K/uL (ref 150–400)
RBC: 3.97 MIL/uL — ABNORMAL LOW (ref 4.22–5.81)
RDW: 18.5 % — ABNORMAL HIGH (ref 11.5–15.5)
WBC: 15.1 K/uL — ABNORMAL HIGH (ref 4.0–10.5)
nRBC: 0 % (ref 0.0–0.2)

## 2023-09-15 LAB — MAGNESIUM: Magnesium: 1.7 mg/dL (ref 1.7–2.4)

## 2023-09-15 LAB — URINE DRUG SCREEN
Amphetamines: NEGATIVE
Barbiturates: NEGATIVE
Benzodiazepines: NEGATIVE
Cocaine: NEGATIVE
Fentanyl: NEGATIVE
Methadone Scn, Ur: NEGATIVE
Opiates: NEGATIVE
Tetrahydrocannabinol: NEGATIVE

## 2023-09-15 LAB — AMMONIA: Ammonia: 25 umol/L (ref 9–35)

## 2023-09-15 LAB — PHOSPHORUS: Phosphorus: 2.3 mg/dL — ABNORMAL LOW (ref 2.5–4.6)

## 2023-09-15 LAB — MRSA NEXT GEN BY PCR, NASAL: MRSA by PCR Next Gen: NOT DETECTED

## 2023-09-15 MED ORDER — ORAL CARE MOUTH RINSE: 15.0000 mL | OROMUCOSAL | Status: DC | PRN

## 2023-09-15 MED ORDER — DEXAMETHASONE SODIUM PHOSPHATE 10 MG/ML IJ SOLN: 8.0000 mg | Freq: Four times a day (QID) | INTRAMUSCULAR | Status: DC

## 2023-09-15 MED ORDER — GADOBUTROL 1 MMOL/ML IV SOLN
6.0000 mL | Freq: Once | INTRAVENOUS | Status: AC | PRN
  Administered 2023-09-15: 6 mL via INTRAVENOUS

## 2023-09-15 MED ORDER — DEXAMETHASONE SODIUM PHOSPHATE 10 MG/ML IJ SOLN
10.0000 mg | Freq: Two times a day (BID) | INTRAMUSCULAR | Status: DC
  Administered 2023-09-15 – 2023-09-16 (×3): 10 mg via INTRAVENOUS
  Filled 2023-09-15 (×3): qty 1

## 2023-09-15 MED ORDER — TRAMADOL HCL 50 MG PO TABS
25.0000 mg | ORAL_TABLET | Freq: Once | ORAL | Status: AC
  Administered 2023-09-15: 25 mg via ORAL
  Filled 2023-09-15: qty 1

## 2023-09-15 MED ORDER — GLUCOSE 4 G PO CHEW
CHEWABLE_TABLET | ORAL | Status: AC
  Filled 2023-09-15: qty 1

## 2023-09-15 MED ORDER — NALOXONE HCL 0.4 MG/ML IJ SOLN
0.4000 mg | INTRAMUSCULAR | Status: DC | PRN
  Administered 2023-09-15: 0.4 mg via INTRAVENOUS

## 2023-09-15 MED ORDER — DEXTROSE 5 % IV SOLN
20.0000 mmol | Freq: Once | INTRAVENOUS | Status: AC
  Administered 2023-09-15: 20 mmol via INTRAVENOUS
  Filled 2023-09-15: qty 6.67

## 2023-09-15 MED ORDER — NALOXONE HCL 0.4 MG/ML IJ SOLN
INTRAMUSCULAR | Status: AC
  Filled 2023-09-15: qty 1

## 2023-09-15 MED ORDER — NALOXONE HCL 0.4 MG/ML IJ SOLN
INTRAMUSCULAR | Status: AC
  Administered 2023-09-15: 0.4 mg via INTRAVENOUS
  Filled 2023-09-15: qty 1

## 2023-09-15 NOTE — Plan of Care (Signed)

## 2023-09-15 NOTE — Progress Notes (Signed)
 PROGRESS NOTE    Terry Page.  FMW:[id number] DOB: 03-24-1955 DOA: 09/12/2023 PCP: Rolinda Millman, MD    Brief Narrative:  68 year old with chronic right hip pain, low back pain, chemo-induced anemia, metastatic adenocarcinoma of the lung treated with chemo, prostate cancer who presents to the emergency room with rectal pain and constipation for 2 days.  He is taking Senokot at home without relief.  Patient was without any nausea vomiting.  In the emergency room hemodynamically stable.  KUB with dilated bowel loops small bowel obstruction versus ileus.  Colonic stool burden.  Admitted due to significant symptoms. 9/13, his constipation was improving however patient was found to be excessively sleepy.  He was difficult to arouse.  Rapid response called.  On further investigations, found to have multiple metastatic lesion with left cerebellar mass effect and pressure on the fourth ventricle.  Subjective:  Went to examine patient in the morning and he was very lethargic.  Difficult to arouse.  Rapid response was called.  Patient was hemodynamically stable but altered with pinpoint pupil.  Since he did not respond to painful stimuli, was given 2 doses of Narcan  and patient woke up with normal sensation and orientation again to go back to sleep. Multiple investigations were done. CT head consistent with vasogenic edema of the left cerebellum with mass effect effacing the fourth ventricle consistent with metastatic adenocarcinoma. Patient was stabilized, monitoring at stepdown unit.  Starting on steroids.  Will discuss with oncology.   Assessment & Plan:   Altered mental status/encephalopathy and lethargy: Patient with intermittent sleepiness.  Electrolytes adequate.  Lactic acid normal.  Ammonia normal.  Blood gas normal.  No focal deficit.  Symmetrical pupil.  Initially responded to Narcan .  No recent opiate use. CT scan consistent with metastatic lesion with edema. Start high-dose  dexamethasone . Consulted oncology. Will consult palliative care team.  Idiopathic constipation likely secondary to chronic pain, rectal pain: recurrent nausea .  Nausea despite improvement of bowel function.  Manual evacuation in the ER. Established bowel function.  Some of the nausea may be related to intracranial lesion. Continue IV fluids. Continue bowel regimen.  Metastatic adenocarcinoma of the lung: With progressive metastatic disease.  Has outpatient follow-up with palliative and oncology. Now symptomatic intracranial lesion.  As above.  Hypokalemia: Replace.  Replace phosphorus.    DVT prophylaxis: enoxaparin  (LOVENOX ) injection 40 mg Start: 09/13/23 1000   Code Status: Full code Family Communication: Wife called and updated. Disposition Plan: Status is: Inpatient Remains inpatient appropriate because: Significant abdominal pain, unable to relieve constipation Now with encephalopathy.     Consultants:  Oncology  Procedures:  None  Antimicrobials:  None     Objective: Vitals:   09/15/23 1020 09/15/23 1200 09/15/23 1239 09/15/23 1300  BP: (!) 186/88 (!) 177/103  (!) 159/90  Pulse: (!) 107 (!) 101 77 (!) 101  Resp:  (!) 25  (!) 31  Temp:   98 F (36.7 C)   TempSrc:   Oral   SpO2: 98% 99%  94%  Height:   5' 9 (1.753 m)     Intake/Output Summary (Last 24 hours) at 09/15/2023 1325 Last data filed at 09/15/2023 1211 Gross per 24 hour  Intake 2010.04 ml  Output 0 ml  Net 2010.04 ml   There were no vitals filed for this visit.  Examination:  General exam: Patient was lethargic and not responding to painful stimuli, he will respond in between and go back to being lethargic.  No focal neurological deficits.  Able to follow commands and eat regular food. Respiratory system: Clear to auscultation. Respiratory effort normal. Cardiovascular system: S1 & S2 heard, RRR. No JVD, murmurs, rubs, gallops or clicks. No pedal edema.  Right chest wall Port-A-Cath  present. Gastrointestinal system: Abdomen is nondistended, soft and nontender. No organomegaly or masses felt. Normal bowel sounds heard. Central nervous system: Alert awake and oriented while he is in the awake state.      Data Reviewed: I have personally reviewed following labs and imaging studies  CBC: Recent Labs  Lab 09/12/23 2056 09/14/23 0109 09/15/23 1107  WBC 12.3* 12.8* 15.1*  NEUTROABS 9.7*  --  12.3*  HGB 10.6* 10.3* 10.3*  HCT 34.2* 33.0* 33.7*  MCV 84.9 84.4 84.9  PLT 345 345 317   Basic Metabolic Panel: Recent Labs  Lab 09/12/23 2056 09/14/23 0109 09/15/23 1107  NA 130* 131* 135  K 4.4 3.1* 3.1*  CL 95* 97* 100  CO2 22 23 23   GLUCOSE 108* 112* 121*  BUN 16 13 11   CREATININE 0.85 0.79 0.82  CALCIUM  10.3 9.6 9.6  MG  --   --  1.7  PHOS  --   --  2.3*   GFR: Estimated Creatinine Clearance: 70.8 mL/min (by C-G formula based on SCr of 0.82 mg/dL). Liver Function Tests: Recent Labs  Lab 09/12/23 2056 09/15/23 1107  AST 20 18  ALT 9 7  ALKPHOS 78 68  BILITOT 0.5 0.4  PROT 8.3* 7.3  ALBUMIN 3.8 3.3*   No results for input(s): LIPASE, AMYLASE in the last 168 hours. Recent Labs  Lab 09/15/23 1107  AMMONIA 25   Coagulation Profile: No results for input(s): INR, PROTIME in the last 168 hours. Cardiac Enzymes: No results for input(s): CKTOTAL, CKMB, CKMBINDEX, TROPONINI in the last 168 hours. BNP (last 3 results) No results for input(s): PROBNP in the last 8760 hours. HbA1C: No results for input(s): HGBA1C in the last 72 hours. CBG: Recent Labs  Lab 09/15/23 0934  GLUCAP 114*   Lipid Profile: No results for input(s): CHOL, HDL, LDLCALC, TRIG, CHOLHDL, LDLDIRECT in the last 72 hours. Thyroid  Function Tests: No results for input(s): TSH, T4TOTAL, FREET4, T3FREE, THYROIDAB in the last 72 hours. Anemia Panel: No results for input(s): VITAMINB12, FOLATE, FERRITIN, TIBC, IRON, RETICCTPCT in  the last 72 hours. Sepsis Labs: No results for input(s): PROCALCITON, LATICACIDVEN in the last 168 hours.  Recent Results (from the past 240 hours)  MRSA Next Gen by PCR, Nasal     Status: None   Collection Time: 09/15/23 12:08 PM   Specimen: Nasal Mucosa; Nasal Swab  Result Value Ref Range Status   MRSA by PCR Next Gen NOT DETECTED NOT DETECTED Final    Comment: (NOTE) The GeneXpert MRSA Assay (FDA approved for NASAL specimens only), is one component of a comprehensive MRSA colonization surveillance program. It is not intended to diagnose MRSA infection nor to guide or monitor treatment for MRSA infections. Test performance is not FDA approved in patients less than 67 years old. Performed at [facility], [address], [place], [place] [address]          Radiology Studies: DG Abd 1 View Result Date: 09/15/2023 CLINICAL DATA:  68 year old male with lung cancer.  Ileus. EXAM: ABDOMEN - 1 VIEW COMPARISON:  Abdominal radiographs 09/12/2023 and earlier. FINDINGS: Portable AP supine view at 1101 hours. Ongoing widespread gas containing bowel loops in the abdomen. Since 09/12/2023 small bowel loops are mildly diminished, but gas in the  right colon and sigmoid have increased. Paucity of distal large bowel gas as before. But previously seen rectal retained stool appears resolved. Extensive Aortoiliac calcified atherosclerosis. Stable visualized osseous structures. IMPRESSION: Gas pattern suggesting ileus, overall not significantly changed from 09/12/2023. Resolved retained stool in the rectum since that time. Aortic Atherosclerosis (ICD10-I70.0). Electronically Signed   By: VEAR Hurst M.D.   On: 09/15/2023 12:04   CT HEAD WO CONTRAST ( ) Result Date: 09/15/2023 CLINICAL DATA:  68 year old male with altered mental status. Metastatic lung cancer. Pain. EXAM: CT HEAD WITHOUT CONTRAST TECHNIQUE: Contiguous axial images were obtained from the base of the skull through the  vertex without intravenous contrast. RADIATION DOSE REDUCTION: This exam was performed according to the departmental dose-optimization program which includes automated exposure control, adjustment of the mA and/or kV according to patient size and/or use of iterative reconstruction technique. COMPARISON:  Brain MRI, Head CT 04/20/2023. FINDINGS: Brain: Stable cerebral volume. Chronic lacunar infarct left corona radiata. Confluent new hypodensity in the posterior left hemisphere at the junction of the left temporal and occipital lobes (series 2, image 19). No significant effect there. However, similar new confluent hypodensity in the left cerebellar hemisphere, and associated new effacement of the 4th ventricle (series 2, image 13). Lateral and 3rd ventricle size remains stable, within normal limits. No superimposed acute intracranial hemorrhage, midline shift. No areas suggestive of cytotoxic edema. Mass Vascular: Calcified atherosclerosis at the skull base. No suspicious intracranial vascular hyperdensity. Skull: Stable and intact. No acute or suspicious osseous lesion identified. Sinuses/Orbits: Visualized paranasal sinuses and mastoids are stable and well aerated. Other: No acute orbit or scalp soft tissue finding. IMPRESSION: 1. Positive for vasogenic edema in the left cerebellum with mass effect effacing the 4th ventricle, new since April. And there is less pronounced new vasogenic edema also in the posterior left cerebral hemisphere. 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 2. No associated ventriculomegaly. No midline shift or significant intracranial mass effect at this time. Electronically Signed   By: VEAR Hurst M.D.   On: 09/15/2023 12:02         Scheduled Meds:  aspirin  EC  81 mg Oral Daily   Chlorhexidine  Gluconate Cloth  6 each Topical Daily   dexamethasone  (DECADRON ) injection  8 mg Intravenous Q6H   enoxaparin   (LOVENOX ) injection  40 mg Subcutaneous Q24H   glucose       lactulose   20 g Oral BID   lidocaine   1 patch Transdermal Q24H   mirtazapine   7.5 mg Oral QHS   potassium chloride   20 mEq Oral BID   senna-docusate  2 tablet Oral BID   sodium chloride  flush  10-40 mL Intracatheter Q12H   Continuous Infusions:  lactated ringers  Stopped (09/15/23 1137)   potassium PHOSPHATE  IVPB (in mmol)       LOS: 3 days    Time spent: 55 minutes    Renato Applebaum, MD [facility]

## 2023-09-15 NOTE — Significant Event (Signed)
 Walked into patients room to give medications. Patient briefly opened eyes patient fell back asleep quickly. Provider came in the room tried to wake up patient but would not sustain eye opening. Rapid was called. Sternal rub - nonarousable. Vitals and blood sugars all stable. Verbal order to give two doses of Narcan  by Dr. Raenelle. Patient was responsive following. Planned to get new labs and imaging. No narcotics were given prior to this event.

## 2023-09-16 ENCOUNTER — Encounter (HOSPITAL_COMMUNITY): Payer: Self-pay | Admitting: Internal Medicine

## 2023-09-16 DIAGNOSIS — Z79899 Other long term (current) drug therapy: Secondary | ICD-10-CM | POA: Diagnosis not present

## 2023-09-16 DIAGNOSIS — R4589 Other symptoms and signs involving emotional state: Secondary | ICD-10-CM

## 2023-09-16 DIAGNOSIS — Z515 Encounter for palliative care: Secondary | ICD-10-CM | POA: Diagnosis not present

## 2023-09-16 DIAGNOSIS — Z7189 Other specified counseling: Secondary | ICD-10-CM | POA: Diagnosis not present

## 2023-09-16 DIAGNOSIS — Z66 Do not resuscitate: Secondary | ICD-10-CM

## 2023-09-16 DIAGNOSIS — C7931 Secondary malignant neoplasm of brain: Secondary | ICD-10-CM

## 2023-09-16 DIAGNOSIS — K567 Ileus, unspecified: Secondary | ICD-10-CM | POA: Diagnosis not present

## 2023-09-16 DIAGNOSIS — C3490 Malignant neoplasm of unspecified part of unspecified bronchus or lung: Secondary | ICD-10-CM | POA: Diagnosis not present

## 2023-09-16 DIAGNOSIS — G893 Neoplasm related pain (acute) (chronic): Secondary | ICD-10-CM

## 2023-09-16 LAB — BASIC METABOLIC PANEL WITH GFR
Anion gap: 17 — ABNORMAL HIGH (ref 5–15)
BUN: 10 mg/dL (ref 8–23)
CO2: 20 mmol/L — ABNORMAL LOW (ref 22–32)
Calcium: 9.8 mg/dL (ref 8.9–10.3)
Chloride: 94 mmol/L — ABNORMAL LOW (ref 98–111)
Creatinine, Ser: 0.79 mg/dL (ref 0.61–1.24)
GFR, Estimated: >60 mL/min (ref >60)
Glucose, Bld: 141 mg/dL — ABNORMAL HIGH (ref 70–99)
Potassium: 3.7 mmol/L (ref 3.5–5.1)
Sodium: 132 mmol/L — ABNORMAL LOW (ref 135–145)

## 2023-09-16 LAB — CBC
HCT: 37.4 % — ABNORMAL LOW (ref 39.0–52.0)
Hemoglobin: 12 g/dL — ABNORMAL LOW (ref 13.0–17.0)
MCH: 26.7 pg (ref 26.0–34.0)
MCHC: 32.1 g/dL (ref 30.0–36.0)
MCV: 83.3 fL (ref 80.0–100.0)
Platelets: 383 K/uL (ref 150–400)
RBC: 4.49 MIL/uL (ref 4.22–5.81)
RDW: 18.5 % — ABNORMAL HIGH (ref 11.5–15.5)
WBC: 11.2 K/uL — ABNORMAL HIGH (ref 4.0–10.5)
nRBC: 0 % (ref 0.0–0.2)

## 2023-09-16 MED ORDER — DEXAMETHASONE 2 MG PO TABS
4.0000 mg | ORAL_TABLET | Freq: Three times a day (TID) | ORAL | Status: DC
  Administered 2023-09-16 – 2023-09-17 (×3): 4 mg via ORAL
  Filled 2023-09-16 (×3): qty 2

## 2023-09-16 MED ORDER — HYDROMORPHONE HCL 1 MG/ML PO LIQD: 0.5000 mg | ORAL | Status: DC | PRN

## 2023-09-16 MED ORDER — HYDRALAZINE HCL 20 MG/ML IJ SOLN
10.0000 mg | Freq: Once | INTRAMUSCULAR | Status: AC | PRN
  Administered 2023-09-16: 10 mg via INTRAVENOUS
  Filled 2023-09-16: qty 1

## 2023-09-16 MED ORDER — TRAMADOL HCL 50 MG PO TABS: 25.0000 mg | ORAL_TABLET | Freq: Four times a day (QID) | ORAL | Status: DC | PRN

## 2023-09-16 MED ORDER — LABETALOL HCL 5 MG/ML IV SOLN: 20.0000 mg | INTRAVENOUS | Status: DC | PRN

## 2023-09-16 NOTE — Progress Notes (Signed)
 Darryle Long [address] [facility] Liaison Note    Received request from Tinnie Radar ,DO Rian  [facility], Transitions of Care Manager included), for hospice services at home after discharge. Spoke with patient at bedside and wife included via phone while on speaker, to initiate education related to hospice philosophy, services, and team approach to care. Patient verbalized understanding of information given. Per discussion, the plan is for discharge home by private vehicle on 9.15.25.    DME needs discussed. Patient has no needs at this time  DME in the home: Rollator, Paediatric nurse, Single [employer]     Please send signed and completed DNR home with patient/family. Please provide prescriptions at discharge as needed to ensure ongoing symptom management.    [facility] information and contact numbers given to patient.   Above information shared with [facility], Transitions of Care Manager and care team.    Please call with any questions or concerns.    Thank you for the opportunity to participate in this patient's care.    Nat Babe, BSN, [facility] Liaison [phone]

## 2023-09-16 NOTE — TOC Progression Note (Signed)
 Transition of Care (TOC) - Progression Note    Patient Details  Name: Terry Page. MRN: [id number] Date of Birth: [birth year]  Transition of Care [facility]) CM/SW Contact  Sonda Manuella Quill, RN Phone Number: 09/16/2023, 3:27 PM  Clinical Narrative:    [facility] consulted for d/c planning/hospice consult; see TOC initial note dtd 09/13/23; notified by Dr Clayton pt requests to d/c home w/ [facility] hospice; Nat Babe, Hospital Liaison for agency has contacted pt/wife; plan for d/c home 09/17/23 w/ transport by family; agency contact info placed in follow-up provider section of d/c instructions; Dr Raenelle and Dr Clayton previously notified via secure chat.  Expected Discharge Plan: Home w Home Health Services Barriers to Discharge: Continued Medical Work up               Expected Discharge Plan and Services       Living arrangements for the past 2 months: Apartment                                       Social Drivers of Health (SDOH) Interventions SDOH Screenings   Food Insecurity: No Food Insecurity (09/12/2023)  Housing: Low Risk  (09/12/2023)  Transportation Needs: No Transportation Needs (09/12/2023)  Utilities: Not At Risk (09/12/2023)  Depression (PHQ2-9): Low Risk  (08/03/2023)  Social Connections: Moderately Integrated (09/12/2023)  Tobacco Use: Medium Risk (08/28/2023)    Readmission Risk Interventions    09/13/2023   12:28 PM 04/29/2021    2:00 PM  Readmission Risk Prevention Plan  Transportation Screening Complete Complete  PCP or Specialist Appt within 5-7 Days  Complete  PCP or Specialist Appt within 3-5 Days Complete   Home Care Screening  Complete  Medication Review (RN CM)  Complete  HRI or Home Care Consult Complete   Palliative Care Screening Not Applicable   Medication Review (RN Care Manager) Complete

## 2023-09-16 NOTE — Consult Note (Signed)
 Consultation Note Date: 09/16/2023   Patient Name: Terry Page.  DOB: 01/14/1955  MRN: [id number]  Age / Sex: 68 y.o., male   PCP: Rolinda Millman, MD Referring Physician: Raenelle Coria, MD  Reason for Consultation: Establishing goals of care     Chief Complaint/History of Present Illness:   Patient is a 68 year old male with a past medical history of chronic right hip pain and low back pain, chemo-induced anemia, metastatic adenocarcinoma of the lung previously on chemotherapy, and prostate cancer who was admitted on 09/12/2023 for rectal pain and constipation for 2 days prior to admission.  Initially symptoms were improving with management of constipation though patient then found on 09/15/2023 to be excessively sleepy so rapid response was called.  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  Palliative medicine team consulted to assist with complex medical decision making. Of note patient seen by outpatient PMT provider on 08/27/2023  Extensive review of EMR including recent documentation from hospitalist.  Also reviewed outpatient oncology documentation from 09/05/2023 which noted patient had progressed through third line chemotherapy.  Patient was at the point of second opinion; not a candidate for clinical trial. Reviewed recent CMP noting sodium low at 132 and phosphorus low at 2.3.  CBC noted patient's WBC downtrending to 11.2 and hemoglobin increased to 12.0. Personally reviewed patient's MRI imaging noting innumerable mets and vasogenic edema associated with this.  Patient already receiving dexamethasone  for management. Discussed care with bedside RN for medical updates.  ------------------------------------------------------------------------------------------------------------- Advance Care Planning Conversation  Pertinent diagnosis: Metastatic lung cancer with worsening disease burden noting innumerable mets to  brain  The patient and family consented to a voluntary Advance Care Planning Conversation in person and over the phone. Individuals present for the conversation: Patient, patient's wife over speaker phone, patient's eldest son, [name], present over speaker phone, and this provider  Summary of the conversation:  Presented to bedside to meet with patient.  Patient seen sitting up in chair at bedside.  Able to introduce myself as a member of the palliative medicine team.  Patient familiar with palliative medicine team since has seen outpatient PMT provider at [facility]  During conversation, patient was able to place his wife Terry Page) and eldest son [name] on speaker phone for discussion.  Spent time reviewing patient's medical illness and answering questions as able regarding this.  Patient and family recognize patient has had further cancer progression.  Patient and family specifically requesting prognosis, expressed concern that patient is looking at potentially a month to maybe 3 depending on how patient does when he gets home.  Patient and family tearful and acknowledging this.  Discussed care planning moving forward.  Able to introduce the idea of hospice and its philosophy.  Spent time providing generalities about home hospice and the support that it would and would not provide.  Patient is wanting to have more time at home to enjoy his quality of life and not to come back to the hospital.  They have had other family members receive hospice support before though it was at an inpatient facility.  They are requesting referral to [facility] hospice here in [place] specifically.  Noted would reach out to [facility] and [facility] liaison to coordinate this.  Patient did state if he was unable to make medical decisions for himself, his wife Terry Page) would be the person to make medical decisions on his behalf.  Outcome of the conversations and/or documents completed:  Coordinating to  get patient home with  [facility] hospice support.  I spent 30 minutes providing separately identifiable ACP services with the patient and/or surrogate decision maker in a voluntary, in-person conversation discussing the patient's wishes and goals as detailed in the above note.  Tinnie Radar, DO Palliative Medicine Provider  -------------------------------------------------------------------------------------------------------------  With permission, also able to discuss patient's CODE STATUS.  Spent time explaining full code versus DNR.  Expressed concern that when patient is sick enough that his heart stops and he stops breathing, interventions such as cardiac resuscitation and intubation with mechanical ventilation would not lead to good quality of life outcomes or changes or changes underlying diagnosis of metastatic cancer that would continue to progress.  After discussion patient requesting change of CODE STATUS to DNR/DNI.  Family supporting of this.  Spent time providing emotional support via active listening.  All questions answered at that time.  Noted palliative medicine team to continue to follow along with patient's medical journey.  Discussed care with hospitalist, TOC, ACC liaison, and RN to coordinate care.  Primary Diagnoses  Present on Admission:  Ile[facility])    Past Medical History:  Diagnosis Date   Alcohol abuse    Asthma    Bullous emphysema ([name])    Essential hypertension 04/10/2020   Hemorrhoids    History of radiation therapy 04/08/20-04/19/20   IMRT- Left lung- Dr. Lynwood Nasuti   Incidental lung nodule, greater than or equal to 8mm 10/22/2017   Left upper lobe - discovered on CTA   Prostate cancer ([name])    Spontaneous pneumothorax 10/20/2017   right   Tobacco abuse    Social History   Socioeconomic History   Marital status: Single    Spouse name: Not on file   Number of children: Not on file   Years of education: Not on file   Highest education level: Not on file   Occupational History   Occupation: retired  Tobacco Use   Smoking status: Former    Current packs/day: 0.00    Types: Cigarettes    Quit date: 11/22/2019    Years since quitting: 3.8   Smokeless tobacco: Never   Tobacco comments:    Patient reports quit 3 years ago. 06/25/20. [facility]  Vaping Use   Vaping status: Never Used  Substance and Sexual Activity   Alcohol use: Yes    Alcohol/week: 12.0 standard drinks of alcohol    Types: 12 Cans of beer per week    Comment: daily sometimes   Drug use: No   Sexual activity: Not Currently  Other Topics Concern   Not on file  Social History Narrative   Not on file   Social Drivers of Health   Financial Resource Strain: Not on file  Food Insecurity: No Food Insecurity (09/12/2023)   Hunger Vital Sign    Worried About Running Out of Food in the Last Year: Never true    Ran Out of Food in the Last Year: Never true  Transportation Needs: No Transportation Needs (09/12/2023)   PRAPARE - Administrator, Civil Service (Medical): No    Lack of Transportation (Non-Medical): No  Physical Activity: Not on file  Stress: Not on file  Social Connections: Moderately Integrated (09/12/2023)   Social Connection and Isolation Panel    Frequency of Communication with Friends and Family: More than three times a week    Frequency of Social Gatherings with Friends and Family: More than three times a week    Attends Religious Services: 1  to 4 times per year    Active Member of Clubs or Organizations: No    Attends Banker Meetings: Never    Marital Status: Married   Family History  Problem Relation Age of Onset   Cancer Cousin        maternal cousin   Cancer Cousin        paternal cousin   Cancer Cousin    Colon polyps Neg Hx    Pancreatic disease Neg Hx    Pancreatic cancer Neg Hx    Breast cancer Neg Hx    Colon cancer Neg Hx    Scheduled Meds:  aspirin  EC  81 mg Oral Daily   Chlorhexidine  Gluconate Cloth  6 each  Topical Daily   dexamethasone  (DECADRON ) injection  10 mg Intravenous Q12H   enoxaparin  (LOVENOX ) injection  40 mg Subcutaneous Q24H   lactulose   20 g Oral BID   lidocaine   1 patch Transdermal Q24H   mirtazapine   7.5 mg Oral QHS   potassium chloride   20 mEq Oral BID   senna-docusate  2 tablet Oral BID   sodium chloride  flush  10-40 mL Intracatheter Q12H   Continuous Infusions: PRN Meds:.acetaminophen  **OR** acetaminophen , albuterol , ipratropium-albuterol , naLOXone  (NARCAN )  injection, ondansetron  **OR** ondansetron  (ZOFRAN ) IV, mouth rinse, sodium chloride  flush Allergies  Allergen Reactions   Ms Contin  [Morphine ] Itching   Roxicodone  [Oxycodone ] Itching   CBC:    Component Value Date/Time   WBC 11.2 (H) 09/16/2023 0446   HGB 12.0 (L) 09/16/2023 0446   HGB 10.6 (L) 09/05/2023 1020   HCT 37.4 (L) 09/16/2023 0446   PLT 383 09/16/2023 0446   PLT 324 09/05/2023 1020   MCV 83.3 09/16/2023 0446   NEUTROABS 12.3 (H) 09/15/2023 1107   LYMPHSABS 1.2 09/15/2023 1107   MONOABS 1.2 (H) 09/15/2023 1107   EOSABS 0.3 09/15/2023 1107   BASOSABS 0.0 09/15/2023 1107   Comprehensive Metabolic Panel:    Component Value Date/Time   NA 135 09/15/2023 1107   K 3.1 (L) 09/15/2023 1107   CL 100 09/15/2023 1107   CO2 23 09/15/2023 1107   BUN 11 09/15/2023 1107   CREATININE 0.82 09/15/2023 1107   CREATININE 0.76 09/05/2023 1020   GLUCOSE 121 (H) 09/15/2023 1107   CALCIUM  9.6 09/15/2023 1107   AST 18 09/15/2023 1107   AST 18 09/05/2023 1020   ALT 7 09/15/2023 1107   ALT 13 09/05/2023 1020   ALKPHOS 68 09/15/2023 1107   BILITOT 0.4 09/15/2023 1107   BILITOT 0.4 09/05/2023 1020   PROT 7.3 09/15/2023 1107   ALBUMIN 3.3 (L) 09/15/2023 1107    Physical Exam: Vital Signs: BP (!) 130/96   Pulse (!) 114   Temp (!) 97.5 F (36.4 C) (Oral)   Resp 18   Ht 5' 9 (1.753 m)   SpO2 99%   BMI 19.45 kg/m  SpO2: SpO2: 99 % O2 Device: O2 Device: Nasal Cannula O2 Flow Rate: O2 Flow Rate (L/min):  2 L/min Intake/output summary:  Intake/Output Summary (Last 24 hours) at 09/16/2023 0736 Last data filed at 09/16/2023 0422 Gross per 24 hour  Intake 677.37 ml  Output 890 ml  Net -212.63 ml   LBM: Last BM Date : 09/15/23 Baseline Weight:   Most recent weight:    General: NAD, alert, chronically ill-appearing, cachectic, frail Cardiovascular: Tachycardia noted Respiratory: Slightly slightly increased work of breathing noted, not in respiratory distress Neuro: A&Ox4, following commands easily Psych: appropriately answers all questions  Palliative Performance Scale: 50%              Additional Data Reviewed: Recent Labs    09/14/23 0109 09/15/23 1107 09/16/23 0446  WBC 12.8* 15.1* 11.2*  HGB 10.3* 10.3* 12.0*  PLT 345 317 383  NA 131* 135  --   BUN 13 11  --   CREATININE 0.79 0.82  --     Imaging: MR BRAIN W WO CONTRAST CLINICAL DATA:  Initial evaluation for metastatic disease. History of lung and prostate cancer.  EXAM: MRI HEAD WITHOUT AND WITH CONTRAST  TECHNIQUE: Multiplanar, multiecho pulse sequences of the brain and surrounding structures were obtained without and with intravenous contrast.  CONTRAST:  6mL GADAVIST  GADOBUTROL  1 MMOL/ML IV SOLN  COMPARISON:  CT from earlier the same day as well as previous MRI from 04/20/2023  FINDINGS: Brain: Mild age-related cerebral atrophy. Patchy T2/FLAIR hyperintensity involving the periventricular and deep white matter, consistent with chronic small vessel ischemic disease. No evidence for acute or subacute infarct. No areas of chronic cortical infarction.  Now seen are innumerable enhancing lesions involving both cerebral hemispheres as well as the left greater than right cerebellum, consistent with intracranial metastatic disease. For reference purposes, the largest supratentorial lesion measures 1.7 x 1.5 cm at the posterior left temporal lobe (series 16, image 61). Mild surrounding vasogenic edema  in this region. Prominent dural based lesion along the left parietal falx measures 2.2 x 1.1 cm (series 16, image 110). Largest lesion within the right cerebral hemisphere present at the right frontotemporal region and measures 8 mm (series 16, image 80). Multiple additional scattered punctate lesions noted about both cerebral hemispheres, with a few additional lesions noted within the brainstem. Largest infratentorial lesion seen at the mesial left cerebellum and measures 2.8 x 2.7 cm (series 16, image 36). Surrounding vasogenic edema throughout the adjacent left greater than right cerebellum. Mild mass effect on the adjacent fourth ventricle which remains patent at this time. No hydrocephalus. Few scattered foci of susceptibility artifact noted about these larger lesions, consistent with associated blood products and/or necrosis.  No other mass effect or midline shift. No extra-axial fluid collection. Pituitary gland within normal limits.  Vascular: Mildly heterogeneous flow voids within the left ICA and left vertebral artery related to atherosclerotic disease. Major intracranial vascular flow voids are otherwise maintained.  Skull and upper cervical spine: Abnormal T1 hypointense lesions seen involving the right lateral mass of C1, consistent with an osseous metastasis (series 7, image 11). Additional probable osseous metastasis at the left parietal calvarium (series 16, image 107). Scalp soft tissues demonstrate no acute finding.  Sinuses/Orbits: Globes orbital soft tissues within normal limits. Paranasal sinuses are largely clear. No significant mastoid effusion.  Other: None.  IMPRESSION: 1. Interval development of innumerable enhancing lesions about both cerebral hemispheres as well as the left greater than right cerebellum, consistent with intracranial metastatic disease. The largest lesion measures 2.8 x 2.7 cm at the mesial left cerebellum. Surrounding vasogenic edema  with mild mass effect on the adjacent fourth ventricle but no hydrocephalus. 2. Osseous metastases involving the right lateral mass of C1 and left parietal calvarium. 3. Underlying age-related cerebral atrophy with chronic small vessel ischemic disease.  Electronically Signed   By: Morene Hoard M.D.   On: 09/15/2023 20:56 DG Abd 1 View CLINICAL DATA:  68 year old male with lung cancer.  Ileus.  EXAM: ABDOMEN - 1 VIEW  COMPARISON:  Abdominal radiographs 09/12/2023 and earlier.  FINDINGS: Portable AP supine view at 1101  hours. Ongoing widespread gas containing bowel loops in the abdomen. Since 09/12/2023 small bowel loops are mildly diminished, but gas in the right colon and sigmoid have increased. Paucity of distal large bowel gas as before. But previously seen rectal retained stool appears resolved. Extensive Aortoiliac calcified atherosclerosis. Stable visualized osseous structures.  IMPRESSION: Gas pattern suggesting ileus, overall not significantly changed from 09/12/2023. Resolved retained stool in the rectum since that time.  Aortic Atherosclerosis (ICD10-I70.0).  Electronically Signed   By: VEAR Hurst M.D.   On: 09/15/2023 12:04 CT HEAD WO CONTRAST ( ) CLINICAL DATA:  68 year old male with altered mental status. Metastatic lung cancer. Pain.  EXAM: CT HEAD WITHOUT CONTRAST  TECHNIQUE: Contiguous axial images were obtained from the base of the skull through the vertex without intravenous contrast.  RADIATION DOSE REDUCTION: This exam was performed according to the departmental dose-optimization program which includes automated exposure control, adjustment of the mA and/or kV according to patient size and/or use of iterative reconstruction technique.  COMPARISON:  Brain MRI, Head CT 04/20/2023.  FINDINGS: Brain: Stable cerebral volume. Chronic lacunar infarct left corona radiata.  Confluent new hypodensity in the posterior left hemisphere at  the junction of the left temporal and occipital lobes (series 2, image 19). No significant effect there. However, similar new confluent hypodensity in the left cerebellar hemisphere, and associated new effacement of the 4th ventricle (series 2, image 13).  Lateral and 3rd ventricle size remains stable, within normal limits.  No superimposed acute intracranial hemorrhage, midline shift. No areas suggestive of cytotoxic edema. Mass  Vascular: Calcified atherosclerosis at the skull base. No suspicious intracranial vascular hyperdensity.  Skull: Stable and intact. No acute or suspicious osseous lesion identified.  Sinuses/Orbits: Visualized paranasal sinuses and mastoids are stable and well aerated.  Other: No acute orbit or scalp soft tissue finding.  IMPRESSION: 1. Positive for vasogenic edema in the left cerebellum with mass effect effacing the 4th ventricle, new since April. And there is less pronounced new vasogenic edema also in the posterior left cerebral hemisphere. 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  2. No associated ventriculomegaly. No midline shift or significant intracranial mass effect at this time.  Electronically Signed   By: VEAR Hurst M.D.   On: 09/15/2023 12:02    I personally reviewed recent imaging.   Palliative Care Assessment and Plan Summary of Established Goals of Care and Medical Treatment Preferences   Patient is a 68 year old male with a past medical history of chronic right hip pain and low back pain, chemo-induced anemia, metastatic adenocarcinoma of the lung previously on chemotherapy, and prostate cancer who was admitted on 09/12/2023 for rectal pain and constipation for 2 days prior to admission.  Initially symptoms were improving with management of constipation though patient then found on 09/15/2023 to be excessively sleepy so rapid response was called.  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  Palliative medicine team consulted to assist with complex medical decision making. Of note patient seen by outpatient PMT provider on 08/27/2023  # Complex medical decision making/goals of care  - Discussed care with patient in person while his wife and eldest son [name] were on speaker phone.  Discussed patient's cancer progression now with innumerable mets to brain.  Patient has already progressed through third line chemotherapy and is not a candidate for clinical trial.  Patient wanting to enjoy quality of life  and time at home.  Introduced hospice philosophy and the support this would provide.  Patient and family agreeing with home hospice referral, specifically requested [facility].  Consulted TOC and ACC liaison to assist with care coordination.  Palliative medicine team continuing to follow along with patient's medical journey.  - Patient has stated that if he was unable to make medical decisions for himself he would want his wife, Terry Page) to make medical decisions on his behalf.  -  Code Status: Limited: Do not attempt resuscitation (DNR) -DNR-LIMITED -Do Not Intubate/DNI       - Discussed CODE STATUS with patient while family on phone as detailed above in HPI.  Explained full code versus DNR/DNI.  Expressed concern that when patient is sick enough that his heart stops or he stops breathing, interventions such as cardiac resuscitation and intubation mechanical ventilation would not lead to good quality of life outcomes or reverse his underlying metastatic cancer.  Patient requesting change of CODE STATUS to DNR/DNI and family supporting of this.   - Placed signed DNR form on patient's paper chart.  Please provide to patient/family at discharge.  # Symptom management Patient is receiving these palliative interventions for symptom management with an intent to improve quality of life.   -  Pain/Dyspnea, in setting of metastatic lung cancer now noted to brain   - Discontinue tramadol  since patient has increased seizure risk with metastatic disease to brain.  Please do not restart on discharge.   - Start oral Dilaudid  solution 0.5 mg every 4 hours as needed.  Continue to adjust based on patient's symptom burden   - Continue dexamethasone    - Constipation   - Continue senna 2 tab twice daily.  Continue to adjust based on patient's symptom burden.   - Continue lactulose  20 mg twice daily.  Continue to adjust based on patient's symptom burden.  # Psycho-social/Spiritual Support:  - Support System: Wife- Terry Careers information officer), 2 sons- [name] and Gilmore CLORE, daughter- Tiara  # Discharge Planning:  Home with Hospice  Thank you for allowing the palliative care team to participate in the care [name].SABRA Tinnie Radar, DO Palliative Care Provider PMT # [phone]  If patient remains symptomatic despite maximum doses, please call PMT at [phone] between 0700 and 1900. Outside of these hours, please call attending, as PMT does not have night coverage.  Billing based on MDM: High  Problems Addressed: One or more chronic illnesses with severe exacerbation, progression, or side effects of treatment.  Risks: Decision not to resuscitate or to de-escalate care because of poor prognosis

## 2023-09-16 NOTE — Plan of Care (Signed)
  Problem: Education: Goal: Knowledge of General Education information will improve Description: Including pain rating scale, medication(s)/side effects and non-pharmacologic comfort measures Outcome: Progressing   Problem: Clinical Measurements: Goal: Respiratory complications will improve Outcome: Progressing   Problem: Clinical Measurements: Goal: Cardiovascular complication will be avoided Outcome: Not Progressing   Problem: Nutrition: Goal: Adequate nutrition will be maintained Outcome: Not Progressing   Problem: Pain Managment: Goal: General experience of comfort will improve and/or be controlled Outcome: Not Progressing

## 2023-09-16 NOTE — Plan of Care (Signed)

## 2023-09-16 NOTE — Progress Notes (Signed)
 PROGRESS NOTE    Terry Page.  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  He is taking Senokot at home without relief.  Patient was without any nausea vomiting.  In the emergency room hemodynamically stable.  KUB with dilated bowel loops small bowel obstruction versus ileus.  Colonic stool burden.  Admitted due to significant symptoms. 9/13, his constipation was improving however patient was found to be excessively sleepy.  He was difficult to arouse.  Rapid response called.  On further investigations, found to have multiple metastatic lesion with left cerebellar mass effect and pressure on the fourth ventricle.  Subjective:  Patient seen and examined.  Today denies any nausea vomiting.  Since yesterday morning he did not have any episodes of excessive sleepiness or lethargy.  Blood pressure was repeated but now better.    Assessment & Plan:   Altered mental status/encephalopathy and lethargy: Patient with intermittent sleepiness, found to have widely metastatic brain cancer from his adenocarcinoma of the lungs. Now on dexamethasone .  Clinically stabilizing.  See goals of care discussion below.  Idiopathic constipation likely secondary to chronic pain, rectal pain: recurrent nausea .  Adequately improved.  Regular diet.  Will reduce dose of laxatives today.  High risk of recurrent constipation, will keep on a scheduled bowel regimen.  Metastatic adenocarcinoma of the lung: With progressive metastatic disease.  Has outpatient follow-up with palliative and oncology. Now symptomatic intracranial lesion.    Hypokalemia: Replaced.  Goal of care: Metastatic adenocarcinoma of the lung, exhausted third line  of chemotherapy.  Now with brain mets.  Palliative consulted.  Will be best served with hospice at home. Ongoing palliative care discussion. Continue supportive care today.    DVT prophylaxis: enoxaparin  (LOVENOX ) injection 40 mg Start: 09/13/23 1000   Code Status: DNR/DNI Family Communication: None today. Disposition Plan: Status is: Inpatient Remains inpatient appropriate because: Significant symptoms.   Consultants:  Oncology Palliative care  Procedures:  None  Antimicrobials:  None     Objective: Vitals:   09/16/23 0700 09/16/23 0800 09/16/23 0900 09/16/23 1000  BP: 115/82 131/85    Pulse: (!) 121 (!) 107 (!) 110 (!) 125  Resp: (!) 24 (!) 21 (!) 23 (!) 39  Temp:  97.9 F (36.6 C)    TempSrc:  Oral    SpO2: 99% 97% 92% 99%  Height:        Intake/Output Summary (Last 24 hours) at 09/16/2023 1053 Last data filed at 09/16/2023 0422 Gross per 24 hour  Intake 677.37 ml  Output 890 ml  Net -212.63 ml   There were no vitals filed for this visit.  Examination:  General exam: Comfortable.  Pleasant interactive today. Respiratory system: Clear to auscultation. Respiratory effort normal. Cardiovascular system: S1 & S2 heard, RRR. No JVD, murmurs, rubs, gallops or clicks. No pedal edema.  Right chest wall Port-A-Cath present. Gastrointestinal system: Abdomen is nondistended, soft and nontender. No organomegaly or masses felt. Normal bowel sounds heard. Central nervous system: Alert awake and oriented.  No focal logical deficits.      Data Reviewed: I have personally reviewed following labs and imaging studies  CBC: Recent Labs  Lab 09/12/23 2056 09/14/23 0109 09/15/23 1107 09/16/23 0446  WBC 12.3* 12.8* 15.1* 11.2*  NEUTROABS 9.7*  --  12.3*  --   HGB 10.6* 10.3* 10.3* 12.0*  HCT 34.2* 33.0* 33.7* 37.4*  MCV 84.9 84.4 84.9 83.3  PLT 345 345 317 383   Basic Metabolic Panel: Recent Labs  Lab 09/12/23 2056 09/14/23 0109 09/15/23 1107 09/16/23 0446   NA 130* 131* 135 132*  K 4.4 3.1* 3.1* 3.7  CL 95* 97* 100 94*  CO2 22 23 23  20*  GLUCOSE 108* 112* 121* 141*  BUN 16 13 11 10   CREATININE 0.85 0.79 0.82 0.79  CALCIUM  10.3 9.6 9.6 9.8  MG  --   --  1.7  --   PHOS  --   --  2.3*  --    GFR: Estimated Creatinine Clearance: 72.6 mL/min (by C-G formula based on SCr of 0.79 mg/dL). Liver Function Tests: Recent Labs  Lab 09/12/23 2056 09/15/23 1107  AST 20 18  ALT 9 7  ALKPHOS 78 68  BILITOT 0.5 0.4  PROT 8.3* 7.3  ALBUMIN 3.8 3.3*   No results for input(s): LIPASE, AMYLASE in the last 168 hours. Recent Labs  Lab 09/15/23 1107  AMMONIA 25   Coagulation Profile: No results for input(s): INR, PROTIME in the last 168 hours. Cardiac Enzymes: No results for input(s): CKTOTAL, CKMB, CKMBINDEX, TROPONINI in the last 168 hours. BNP (last 3 results) No results for input(s): PROBNP in the last 8760 hours. HbA1C: No results for input(s): HGBA1C in the last 72 hours. CBG: Recent Labs  Lab 09/15/23 0934  GLUCAP 114*   Lipid Profile: No results for input(s): CHOL, HDL, LDLCALC, TRIG, CHOLHDL, LDLDIRECT in the last 72 hours. Thyroid  Function Tests: No results for input(s): TSH, T4TOTAL, FREET4, T3FREE, THYROIDAB in the last 72 hours. Anemia Panel: No results for input(s): VITAMINB12, FOLATE, FERRITIN, TIBC, IRON, RETICCTPCT in the last 72 hours. Sepsis Labs: No results for input(s): PROCALCITON, LATICACIDVEN in the last 168 hours.  Recent Results (from the past 240 hours)  MRSA Next Gen by PCR, Nasal     Status: None   Collection Time: 09/15/23 12:08 PM   Specimen: Nasal Mucosa; Nasal Swab  Result Value Ref Range Status   MRSA by PCR Next Gen NOT DETECTED NOT DETECTED Final    Comment: (NOTE) The GeneXpert MRSA Assay (FDA approved for NASAL specimens only), is one component of a comprehensive MRSA colonization surveillance program. It is not intended to diagnose  MRSA infection nor to guide or monitor treatment for MRSA infections. Test performance is not FDA approved in patients less than 62 years old. Performed at [facility], [address], [place], [place] [address]          Radiology Studies: MR BRAIN W WO CONTRAST Result Date: 09/15/2023 CLINICAL DATA:  Initial evaluation for metastatic disease. History of lung and prostate cancer. EXAM: MRI HEAD WITHOUT AND WITH CONTRAST TECHNIQUE: Multiplanar, multiecho pulse sequences of the brain and surrounding structures were obtained without and with intravenous contrast. CONTRAST:  6mL GADAVIST  GADOBUTROL  1 MMOL/ML IV SOLN COMPARISON:  CT from earlier the same day as well as previous MRI from 04/20/2023 FINDINGS: Brain: Mild age-related cerebral atrophy. Patchy T2/FLAIR hyperintensity involving the periventricular and deep white matter, consistent with chronic small vessel ischemic disease. No evidence for acute or subacute infarct. No areas of chronic cortical infarction. Now seen are innumerable enhancing lesions involving both cerebral hemispheres as well as the left greater than right cerebellum, consistent with intracranial metastatic disease. For reference purposes, the largest supratentorial lesion measures  1.7 x 1.5 cm at the posterior left temporal lobe (series 16, image 61). Mild surrounding vasogenic edema in this region. Prominent dural based lesion along the left parietal falx measures 2.2 x 1.1 cm (series 16, image 110). Largest lesion within the right cerebral hemisphere present at the right frontotemporal region and measures 8 mm (series 16, image 80). Multiple additional scattered punctate lesions noted about both cerebral hemispheres, with a few additional lesions noted within the brainstem. Largest infratentorial lesion seen at the mesial left cerebellum and measures 2.8 x 2.7 cm (series 16, image 36). Surrounding vasogenic edema throughout the adjacent left greater than  right cerebellum. Mild mass effect on the adjacent fourth ventricle which remains patent at this time. No hydrocephalus. Few scattered foci of susceptibility artifact noted about these larger lesions, consistent with associated blood products and/or necrosis. No other mass effect or midline shift. No extra-axial fluid collection. Pituitary gland within normal limits. Vascular: Mildly heterogeneous flow voids within the left ICA and left vertebral artery related to atherosclerotic disease. Major intracranial vascular flow voids are otherwise maintained. Skull and upper cervical spine: Abnormal T1 hypointense lesions seen involving the right lateral mass of C1, consistent with an osseous metastasis (series 7, image 11). Additional probable osseous metastasis at the left parietal calvarium (series 16, image 107). Scalp soft tissues demonstrate no acute finding. Sinuses/Orbits: Globes orbital soft tissues within normal limits. Paranasal sinuses are largely clear. No significant mastoid effusion. Other: None. IMPRESSION: 1. Interval development of innumerable enhancing lesions about both cerebral hemispheres as well as the left greater than right cerebellum, consistent with intracranial metastatic disease. The largest lesion measures 2.8 x 2.7 cm at the mesial left cerebellum. Surrounding vasogenic edema with mild mass effect on the adjacent fourth ventricle but no hydrocephalus. 2. Osseous metastases involving the right lateral mass of C1 and left parietal calvarium. 3. Underlying age-related cerebral atrophy with chronic small vessel ischemic disease. Electronically Signed   By: Morene Hoard M.D.   On: 09/15/2023 20:56   DG Abd 1 View Result Date: 09/15/2023 CLINICAL DATA:  68 year old male with lung cancer.  Ileus. EXAM: ABDOMEN - 1 VIEW COMPARISON:  Abdominal radiographs 09/12/2023 and earlier. FINDINGS: Portable AP supine view at 1101 hours. Ongoing widespread gas containing bowel loops in the abdomen.  Since 09/12/2023 small bowel loops are mildly diminished, but gas in the right colon and sigmoid have increased. Paucity of distal large bowel gas as before. But previously seen rectal retained stool appears resolved. Extensive Aortoiliac calcified atherosclerosis. Stable visualized osseous structures. IMPRESSION: Gas pattern suggesting ileus, overall not significantly changed from 09/12/2023. Resolved retained stool in the rectum since that time. Aortic Atherosclerosis (ICD10-I70.0). Electronically Signed   By: VEAR Hurst M.D.   On: 09/15/2023 12:04   CT HEAD WO CONTRAST ( ) Result Date: 09/15/2023 CLINICAL DATA:  68 year old male with altered mental status. Metastatic lung cancer. Pain. EXAM: CT HEAD WITHOUT CONTRAST TECHNIQUE: Contiguous axial images were obtained from the base of the skull through the vertex without intravenous contrast. RADIATION DOSE REDUCTION: This exam was performed according to the departmental dose-optimization program which includes automated exposure control, adjustment of the mA and/or kV according to patient size and/or use of iterative reconstruction technique. COMPARISON:  Brain MRI, Head CT 04/20/2023. FINDINGS: Brain: Stable cerebral volume. Chronic lacunar infarct left corona radiata. Confluent new hypodensity in the posterior left hemisphere at the junction of the left temporal and occipital lobes (series 2, image 19). No significant effect there. However, similar new confluent hypodensity  in the left cerebellar hemisphere, and associated new effacement of the 4th ventricle (series 2, image 13). Lateral and 3rd ventricle size remains stable, within normal limits. No superimposed acute intracranial hemorrhage, midline shift. No areas suggestive of cytotoxic edema. Mass Vascular: Calcified atherosclerosis at the skull base. No suspicious intracranial vascular hyperdensity. Skull: Stable and intact. No acute or suspicious osseous lesion identified. Sinuses/Orbits: Visualized  paranasal sinuses and mastoids are stable and well aerated. Other: No acute orbit or scalp soft tissue finding. IMPRESSION: 1. Positive for vasogenic edema in the left cerebellum with mass effect effacing the 4th ventricle, new since April. And there is less pronounced new vasogenic edema also in the posterior left cerebral hemisphere. 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 2. No associated ventriculomegaly. No midline shift or significant intracranial mass effect at this time. Electronically Signed   By: VEAR Hurst M.D.   On: 09/15/2023 12:02         Scheduled Meds:  aspirin  EC  81 mg Oral Daily   Chlorhexidine  Gluconate Cloth  6 each Topical Daily   dexamethasone  (DECADRON ) injection  10 mg Intravenous Q12H   enoxaparin  (LOVENOX ) injection  40 mg Subcutaneous Q24H   lactulose   20 g Oral BID   lidocaine   1 patch Transdermal Q24H   mirtazapine   7.5 mg Oral QHS   potassium chloride   20 mEq Oral BID   senna-docusate  2 tablet Oral BID   sodium chloride  flush  10-40 mL Intracatheter Q12H   Continuous Infusions:     LOS: 4 days    Time spent: 55 minutes    Renato Applebaum, MD [facility]

## 2023-09-17 ENCOUNTER — Encounter (HOSPITAL_COMMUNITY): Payer: Self-pay | Admitting: Internal Medicine

## 2023-09-17 ENCOUNTER — Other Ambulatory Visit (HOSPITAL_COMMUNITY): Payer: Self-pay

## 2023-09-17 ENCOUNTER — Ambulatory Visit (HOSPITAL_COMMUNITY)

## 2023-09-17 ENCOUNTER — Encounter: Payer: Self-pay | Admitting: Hematology and Oncology

## 2023-09-17 DIAGNOSIS — K567 Ileus, unspecified: Secondary | ICD-10-CM | POA: Diagnosis not present

## 2023-09-17 MED ORDER — DEXAMETHASONE 2 MG PO TABS
4.0000 mg | ORAL_TABLET | Freq: Two times a day (BID) | ORAL | 0 refills | Status: AC
  Filled 2023-09-17: qty 56, 14d supply, fill #0

## 2023-09-17 MED ORDER — POLYETHYLENE GLYCOL 3350 17 GM/SCOOP PO POWD
17.0000 g | Freq: Every day | ORAL | 2 refills | Status: AC
  Filled 2023-09-17: qty 238, 14d supply, fill #0

## 2023-09-17 MED ORDER — HEPARIN SOD (PORK) LOCK FLUSH 100 UNIT/ML IV SOLN
500.0000 [IU] | Freq: Once | INTRAVENOUS | Status: AC
  Administered 2023-09-17: 500 [IU] via INTRAVENOUS
  Filled 2023-09-17: qty 5

## 2023-09-17 NOTE — Plan of Care (Signed)
  Problem: Clinical Measurements: Goal: Respiratory complications will improve Outcome: Progressing Goal: Cardiovascular complication will be avoided Outcome: Progressing   Problem: Activity: Goal: Risk for activity intolerance will decrease Outcome: Progressing   Problem: Pain Managment: Goal: General experience of comfort will improve and/or be controlled Outcome: Progressing

## 2023-09-17 NOTE — TOC Transition Note (Signed)
 Transition of Care [facility]) - Discharge Note   Patient Details  Name: Terry Page. MRN: [id number] Date of Birth: 02/10/1955  Transition of Care [facility]) CM/SW Contact:  Jon ONEIDA Anon, RN Phone Number: 09/17/2023, 9:19 AM   Clinical Narrative:    Pt will discharge home with hospice care services through [facility]. Pt and pt spouse agreeable to the DC plan. Pt spouse to provide transportation at DC. There are no other IP Care Management needs at this time.    Final next level of care: Home w Hospice Care Barriers to Discharge: No Barriers Identified   Patient Goals and CMS Choice Patient states their goals for this hospitalization and ongoing recovery are:: To return home with hospice care CMS Medicare.gov Compare Post Acute Care list provided to:: Patient Choice offered to / list presented to : Patient [place] ownership interest in [facility].provided to:: Patient    Discharge Placement                  Name of family member notified: Aidian, Salomon (Spouse)  [phone] Patient and family notified of of transfer: 09/17/23  Discharge Plan and Services Additional resources added to the After Visit Summary for                  DME Arranged: N/A DME Agency: NA       HH Arranged: NA HH Agency: Other - See comment Photographer)        Social Drivers of Health (SDOH) Interventions SDOH Screenings   Food Insecurity: No Food Insecurity (09/12/2023)  Housing: Low Risk  (09/12/2023)  Transportation Needs: No Transportation Needs (09/12/2023)  Utilities: Not At Risk (09/12/2023)  Depression (PHQ2-9): Low Risk  (08/03/2023)  Social Connections: Moderately Integrated (09/12/2023)  Tobacco Use: Medium Risk (09/17/2023)     Readmission Risk Interventions    09/13/2023   12:28 PM 04/29/2021    2:00 PM  Readmission Risk Prevention Plan  Transportation Screening Complete Complete  PCP or Specialist Appt within 5-7 Days   Complete  PCP or Specialist Appt within 3-5 Days Complete   Home Care Screening  Complete  Medication Review (RN CM)  Complete  HRI or Home Care Consult Complete   Palliative Care Screening Not Applicable   Medication Review (RN Care Manager) Complete

## 2023-09-17 NOTE — Plan of Care (Signed)
 PMT no charge note.   Chart reviewed, sign out received from prior palliative provider Dr Clayton.  Hospice liaison and TOC note reviewed.  Patient has been discharged to home with hospice services. Discharge summary noted No charge Lonia Serve, MD [place] palliative

## 2023-09-17 NOTE — Progress Notes (Signed)
 Friend was contacted yesterday and is available to pick the patient up once discharged this morning.

## 2023-09-17 NOTE — Discharge Summary (Signed)
 Physician Discharge Summary  Terry Page. FMW:[id number] DOB: April 04, 1955 DOA: 09/12/2023  PCP: Rolinda Millman, MD  Admit date: 09/12/2023 Discharge date: 09/17/2023  Admitted From: Home Disposition: Home with home hospice  Recommendations for Outpatient Follow-up:  As per hospice provider  Discharge Condition: Stable CODE STATUS: DNR/DNI Diet recommendation: Regular diet, as tolerated  Discharge summary: 68 year old gentleman with metastatic adenocarcinoma of the lung treated with third line chemo admitted with rectal pain and constipation of 2 days.  He was treated symptomatically, he was found to have episodic drowsiness.  CT scan of the head and MRI consistent with multiple metastatic lesion, left cerebellar mass effect.  Started on steroids with symptom control.  Patient is currently stabilized.  Goal of care discussion was held.  Patient decided to enroll into hospice program and go home with family.  Currently remains stable to go home with family.  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  Currently stable to discharge home. Will keep patient on dexamethasone  to help with cerebral edema and also with pain. Currently pain is controlled with tramadol . Bowel symptoms have improved.  He will continue to take Senokot and MiraLAX . Rest of the management as per hospice provider at home.    Discharge Diagnoses:  Principal Problem:   Ileus ([name]) Active Problems:   DNR (do not resuscitate)   Cancer associated pain   Medication management   Counseling and coordination of care   ACP (advance care planning)   Palliative care encounter   Need for emotional support   Malignant neoplasm metastatic to brain [facility])   Primary malignant neoplasm of lung metastatic to other site [facility])    Discharge Instructions  Discharge Instructions     Diet general    Complete by: As directed    Increase activity slowly   Complete by: As directed       Allergies as of 09/17/2023       Reactions   Ms Contin  [morphine ] Itching   Roxicodone  [oxycodone ] Itching        Medication List     STOP taking these medications    aspirin  EC 81 MG tablet   atorvastatin  40 MG tablet Commonly known as: LIPITOR   potassium chloride  SA 20 MEQ tablet Commonly known as: KLOR-CON  M   sodium chloride  1 g tablet       TAKE these medications    acetaminophen  325 MG tablet Commonly known as: TYLENOL  Take 2 tablets (650 mg total) by mouth every 6 (six) hours as needed for mild pain or headache (fever >/= 101).   albuterol  108 (90 Base) MCG/ACT inhaler Commonly known as: VENTOLIN  HFA Inhale 2 puffs into the lungs every 6 (six) hours as needed for wheezing or shortness of breath.   dexamethasone  4 MG tablet Commonly known as: DECADRON  Take 1 tablet (4 mg total) by mouth 2 (two) times daily for 14 days.   folic acid  1 MG tablet Commonly known as: FOLVITE  Take 1 tablet (1 mg total) by mouth daily.   hydrOXYzine  25 MG tablet Commonly known as: ATARAX  Take 1 tablet (25 mg total) by mouth at bedtime as needed for itching.   ipratropium-albuterol  0.5-2.5 (3) MG/3ML Soln Commonly known as: DUONEB Take 3 mLs by nebulization every 6 (six) hours as needed.   lidocaine  5 % Commonly known as: LIDODERM  Place 1 patch onto the skin daily.  Remove & Discard patch within 12 hours or as directed by MD. Apply to low back What changed:  when to take this reasons to take this additional instructions   lidocaine -prilocaine  cream Commonly known as: EMLA  Apply 1 Application topically as needed.   mirtazapine  7.5 MG tablet Commonly known as: REMERON  Take 1 tablet (7.5 mg total) by mouth at bedtime.   polyethylene glycol 17 g packet Commonly known as: MiraLax  Take 17 g by mouth daily. What changed:  when to take this reasons to take this   senna-docusate  8.6-50 MG tablet Commonly known as: Stimulant Laxative Take 2 tablets by mouth 2 (two) times daily. May add additional tablet as needed   traMADol  50 MG tablet Commonly known as: ULTRAM  Take 1-2 tablets (50-100 mg total) by mouth every 6 (six) hours as needed.        Follow-up Information     [facility] Follow up.   Specialty: Hospice and Palliative Medicine Contact information: [address] [place]  [id number] [phone]               Allergies  Allergen Reactions   Ms Contin  [Morphine ] Itching   Roxicodone  [Oxycodone ] Itching    Consultations: Oncology Palliative care   Procedures/Studies: MR BRAIN W WO CONTRAST Result Date: 09/15/2023 CLINICAL DATA:  Initial evaluation for metastatic disease. History of lung and prostate cancer. EXAM: MRI HEAD WITHOUT AND WITH CONTRAST TECHNIQUE: Multiplanar, multiecho pulse sequences of the brain and surrounding structures were obtained without and with intravenous contrast. CONTRAST:  6mL GADAVIST  GADOBUTROL  1 MMOL/ML IV SOLN COMPARISON:  CT from earlier the same day as well as previous MRI from 04/20/2023 FINDINGS: Brain: Mild age-related cerebral atrophy. Patchy T2/FLAIR hyperintensity involving the periventricular and deep white matter, consistent with chronic small vessel ischemic disease. No evidence for acute or subacute infarct. No areas of chronic cortical infarction. Now seen are innumerable enhancing lesions involving both cerebral hemispheres as well as the left greater than right cerebellum, consistent with intracranial metastatic disease. For reference purposes, the largest supratentorial lesion measures 1.7 x 1.5 cm at the posterior left temporal lobe (series 16, image 61). Mild surrounding vasogenic edema in this region. Prominent dural based lesion along the left parietal falx measures 2.2 x 1.1 cm (series 16, image 110). Largest lesion within the right cerebral hemisphere present at the right  frontotemporal region and measures 8 mm (series 16, image 80). Multiple additional scattered punctate lesions noted about both cerebral hemispheres, with a few additional lesions noted within the brainstem. Largest infratentorial lesion seen at the mesial left cerebellum and measures 2.8 x 2.7 cm (series 16, image 36). Surrounding vasogenic edema throughout the adjacent left greater than right cerebellum. Mild mass effect on the adjacent fourth ventricle which remains patent at this time. No hydrocephalus. Few scattered foci of susceptibility artifact noted about these larger lesions, consistent with associated blood products and/or necrosis. No other mass effect or midline shift. No extra-axial fluid collection. Pituitary gland within normal limits. Vascular: Mildly heterogeneous flow voids within the left ICA and left vertebral artery related to atherosclerotic disease. Major intracranial vascular flow voids are otherwise maintained. Skull and upper cervical spine: Abnormal T1 hypointense lesions seen involving the right lateral mass of C1, consistent with an osseous metastasis (series 7, image 11). Additional probable osseous metastasis at the left parietal calvarium (series 16, image 107). Scalp soft tissues demonstrate no acute finding. Sinuses/Orbits: Globes orbital soft tissues within normal limits. Paranasal sinuses are largely clear. No significant  mastoid effusion. Other: None. IMPRESSION: 1. Interval development of innumerable enhancing lesions about both cerebral hemispheres as well as the left greater than right cerebellum, consistent with intracranial metastatic disease. The largest lesion measures 2.8 x 2.7 cm at the mesial left cerebellum. Surrounding vasogenic edema with mild mass effect on the adjacent fourth ventricle but no hydrocephalus. 2. Osseous metastases involving the right lateral mass of C1 and left parietal calvarium. 3. Underlying age-related cerebral atrophy with chronic small vessel  ischemic disease. Electronically Signed   By: Morene Hoard M.D.   On: 09/15/2023 20:56   DG Abd 1 View Result Date: 09/15/2023 CLINICAL DATA:  68 year old male with lung cancer.  Ileus. EXAM: ABDOMEN - 1 VIEW COMPARISON:  Abdominal radiographs 09/12/2023 and earlier. FINDINGS: Portable AP supine view at 1101 hours. Ongoing widespread gas containing bowel loops in the abdomen. Since 09/12/2023 small bowel loops are mildly diminished, but gas in the right colon and sigmoid have increased. Paucity of distal large bowel gas as before. But previously seen rectal retained stool appears resolved. Extensive Aortoiliac calcified atherosclerosis. Stable visualized osseous structures. IMPRESSION: Gas pattern suggesting ileus, overall not significantly changed from 09/12/2023. Resolved retained stool in the rectum since that time. Aortic Atherosclerosis (ICD10-I70.0). Electronically Signed   By: VEAR Hurst M.D.   On: 09/15/2023 12:04   CT HEAD WO CONTRAST ( ) Result Date: 09/15/2023 CLINICAL DATA:  68 year old male with altered mental status. Metastatic lung cancer. Pain. EXAM: CT HEAD WITHOUT CONTRAST TECHNIQUE: Contiguous axial images were obtained from the base of the skull through the vertex without intravenous contrast. RADIATION DOSE REDUCTION: This exam was performed according to the departmental dose-optimization program which includes automated exposure control, adjustment of the mA and/or kV according to patient size and/or use of iterative reconstruction technique. COMPARISON:  Brain MRI, Head CT 04/20/2023. FINDINGS: Brain: Stable cerebral volume. Chronic lacunar infarct left corona radiata. Confluent new hypodensity in the posterior left hemisphere at the junction of the left temporal and occipital lobes (series 2, image 19). No significant effect there. However, similar new confluent hypodensity in the left cerebellar hemisphere, and associated new effacement of the 4th ventricle (series 2, image 13).  Lateral and 3rd ventricle size remains stable, within normal limits. No superimposed acute intracranial hemorrhage, midline shift. No areas suggestive of cytotoxic edema. Mass Vascular: Calcified atherosclerosis at the skull base. No suspicious intracranial vascular hyperdensity. Skull: Stable and intact. No acute or suspicious osseous lesion identified. Sinuses/Orbits: Visualized paranasal sinuses and mastoids are stable and well aerated. Other: No acute orbit or scalp soft tissue finding. IMPRESSION: 1. Positive for vasogenic edema in the left cerebellum with mass effect effacing the 4th ventricle, new since April. And there is less pronounced new vasogenic edema also in the posterior left cerebral hemisphere. 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 2. No associated ventriculomegaly. No midline shift or significant intracranial mass effect at this time. Electronically Signed   By: VEAR Hurst M.D.   On: 09/15/2023 12:02   DG Abd Portable 1 View Result Date: 09/12/2023 CLINICAL DATA:  Constipation. EXAM: PORTABLE ABDOMEN - 1 VIEW COMPARISON:  CT dated 07/16/2023. FINDINGS: Dilated small bowel loops measure up to 3.7 cm and may represent obstruction or ileus. There is moderate stool throughout the colon. No free air. Degenerative changes of the spine and hips. No acute osseous pathology. Poorly visualized bilateral pulmonary nodules. IMPRESSION: 1. Small bowel obstruction versus ileus. 2. Moderate colonic stool  burden. Electronically Signed   By: Vanetta Chou M.D.   On: 09/12/2023 19:56   (Echo, Carotid, EGD, Colonoscopy, ERCP)    Subjective: Patient seen and examined.  Denies any complaints.  No overnight events.  He has remained pain-free.  Eating regular diet.  Having regular bowel movements.  Wife on the speaker phone.  Patient is determined about his decision about going home and dying peacefully at home.   Discharge  Exam: Vitals:   09/17/23 0423 09/17/23 0738  BP:    Pulse:    Resp:    Temp: (!) 97.2 F (36.2 C) 97.9 F (36.6 C)  SpO2:     Vitals:   09/17/23 0400 09/17/23 0423 09/17/23 0738 09/17/23 0800  BP: 114/81     Pulse: (!) 103     Resp: (!) 25     Temp:  (!) 97.2 F (36.2 C) 97.9 F (36.6 C)   TempSrc:  Oral Oral   SpO2: 95%     Weight:    60 kg  Height:    5' 9 (1.753 m)    General: Pt is alert, awake, not in acute distress Pleasant and interactive. Cardiovascular: RRR, S1/S2 +, no rubs, no gallops Respiratory: CTA bilaterally, no wheezing, no rhonchi Abdominal: Soft, NT, ND, bowel sounds + Extremities: no edema, no cyanosis    The results of significant diagnostics from this hospitalization (including imaging, microbiology, ancillary and laboratory) are listed below for reference.     Microbiology: Recent Results (from the past 240 hours)  MRSA Next Gen by PCR, Nasal     Status: None   Collection Time: 09/15/23 12:08 PM   Specimen: Nasal Mucosa; Nasal Swab  Result Value Ref Range Status   MRSA by PCR Next Gen NOT DETECTED NOT DETECTED Final    Comment: (NOTE) The GeneXpert MRSA Assay (FDA approved for NASAL specimens only), is one component of a comprehensive MRSA colonization surveillance program. It is not intended to diagnose MRSA infection nor to guide or monitor treatment for MRSA infections. Test performance is not FDA approved in patients less than 46 years old. Performed at [facility], [address] [place], [place] [address]      Labs: BNP (last 3 results) Recent Labs    04/08/23 2125 04/22/23 0328  BNP 135.9* 98.6   Basic Metabolic Panel: Recent Labs  Lab 09/12/23 2056 09/14/23 0109 09/15/23 1107 09/16/23 0446  NA 130* 131* 135 132*  K 4.4 3.1* 3.1* 3.7  CL 95* 97* 100 94*  CO2 22 23 23  20*  GLUCOSE 108* 112* 121* 141*  BUN 16 13 11 10   CREATININE 0.85 0.79 0.82 0.79  CALCIUM  10.3 9.6 9.6 9.8  MG  --   --   1.7  --   PHOS  --   --  2.3*  --    Liver Function Tests: Recent Labs  Lab 09/12/23 2056 09/15/23 1107  AST 20 18  ALT 9 7  ALKPHOS 78 68  BILITOT 0.5 0.4  PROT 8.3* 7.3  ALBUMIN 3.8 3.3*   No results for input(s): LIPASE, AMYLASE in the last 168 hours. Recent Labs  Lab 09/15/23 1107  AMMONIA 25   CBC: Recent Labs  Lab 09/12/23 2056 09/14/23 0109 09/15/23 1107 09/16/23 0446  WBC 12.3* 12.8* 15.1* 11.2*  NEUTROABS 9.7*  --  12.3*  --   HGB 10.6* 10.3* 10.3* 12.0*  HCT 34.2* 33.0* 33.7* 37.4*  MCV 84.9 84.4 84.9 83.3  PLT 345 345  317 383   Cardiac Enzymes: No results for input(s): CKTOTAL, CKMB, CKMBINDEX, TROPONINI in the last 168 hours. BNP: Invalid input(s): POCBNP CBG: Recent Labs  Lab 09/15/23 0934  GLUCAP 114*   D-Dimer No results for input(s): DDIMER in the last 72 hours. Hgb A1c No results for input(s): HGBA1C in the last 72 hours. Lipid Profile No results for input(s): CHOL, HDL, LDLCALC, TRIG, CHOLHDL, LDLDIRECT in the last 72 hours. Thyroid  function studies No results for input(s): TSH, T4TOTAL, T3FREE, THYROIDAB in the last 72 hours.  Invalid input(s): FREET3 Anemia work up No results for input(s): VITAMINB12, FOLATE, FERRITIN, TIBC, IRON, RETICCTPCT in the last 72 hours. Urinalysis    Component Value Date/Time   COLORURINE YELLOW 04/20/2023 2204   APPEARANCEUR CLEAR 04/20/2023 2204   LABSPEC >1.046 (H) 04/20/2023 2204   PHURINE 6.0 04/20/2023 2204   GLUCOSEU NEGATIVE 04/20/2023 2204   HGBUR NEGATIVE 04/20/2023 2204   BILIRUBINUR NEGATIVE 04/20/2023 2204   KETONESUR NEGATIVE 04/20/2023 2204   PROTEINUR NEGATIVE 04/20/2023 2204   NITRITE NEGATIVE 04/20/2023 2204   LEUKOCYTESUR NEGATIVE 04/20/2023 2204   Sepsis Labs Recent Labs  Lab 09/12/23 2056 09/14/23 0109 09/15/23 1107 09/16/23 0446  WBC 12.3* 12.8* 15.1* 11.2*   Microbiology Recent Results (from the past 240 hours)   MRSA Next Gen by PCR, Nasal     Status: None   Collection Time: 09/15/23 12:08 PM   Specimen: Nasal Mucosa; Nasal Swab  Result Value Ref Range Status   MRSA by PCR Next Gen NOT DETECTED NOT DETECTED Final    Comment: (NOTE) The GeneXpert MRSA Assay (FDA approved for NASAL specimens only), is one component of a comprehensive MRSA colonization surveillance program. It is not intended to diagnose MRSA infection nor to guide or monitor treatment for MRSA infections. Test performance is not FDA approved in patients less than 86 years old. Performed at [facility], [address] [place], [place] [address]      Time coordinating discharge: 40 minutes  SIGNED:   Renato Applebaum, MD  [facility] 09/17/2023, 8:27 AM

## 2023-09-18 ENCOUNTER — Telehealth: Payer: Self-pay | Admitting: *Deleted

## 2023-09-18 NOTE — Telephone Encounter (Signed)
 Mr. Dalby called to inform office that he won't be coming to appt at [facility] tomorrow with Dr. Sherrod. He was recently in ED and then admitted to the hospital. He states that while he was there, they found he had cancer in his brain. He said that he discussed this with the doctor while he was in the hospital and decided to start hospice care. Informed him he was always welcome to call this office at any time with personal updates.

## 2023-09-19 ENCOUNTER — Ambulatory Visit: Admitting: Internal Medicine

## 2023-09-19 ENCOUNTER — Other Ambulatory Visit

## 2023-09-19 ENCOUNTER — Inpatient Hospital Stay

## 2023-10-08 ENCOUNTER — Other Ambulatory Visit: Payer: Self-pay | Admitting: *Deleted

## 2023-10-16 ENCOUNTER — Telehealth: Payer: Self-pay

## 2023-10-16 NOTE — Telephone Encounter (Addendum)
 Opened in error

## 2023-10-16 NOTE — Telephone Encounter (Signed)
 Pt called stating that he felt left in limbo and wanted to be sure he was doing the right thing. He states hospice comes to his house weekly. He wants to know if Dr Federico thinks hospice is the best option. Message sent to Dr Federico to advise.

## 2023-10-18 ENCOUNTER — Telehealth: Payer: Self-pay | Admitting: Hematology and Oncology

## 2023-10-22 ENCOUNTER — Ambulatory Visit: Admitting: Podiatry

## 2023-10-22 ENCOUNTER — Other Ambulatory Visit: Payer: Self-pay | Admitting: Physician Assistant

## 2023-10-22 DIAGNOSIS — C3412 Malignant neoplasm of upper lobe, left bronchus or lung: Secondary | ICD-10-CM

## 2023-10-22 NOTE — Progress Notes (Deleted)
 [facility] [phone]   [phone]  PROGRESS NOTE  Patient Care Team: Rolinda Millman, MD as PCP - General (Family Medicine)  Hematological/Oncological History # Metastatic Adenocarcinoma of the Lung  01/07/2020 : CT of the chest with contrast performed which showed interval enlargement of a spiculated nodule in the central left upper lobe measuring 1.2 x 1.0 cm and highly suspicious for primary lung malignancy 02/06/2020 :PET scan performed which showed a hypermetabolic irregular solid 1.3 cm left upper lobe pulmonary nodule compatible with malignancy without any other hypermetabolic lesions 05/04/7975- 04/19/2020:  SBRT to the lung lesion 04/28/2021: CT C/A/P showed multifocal lytic bone metastases are identified. Lesion within the spine of the left scapula and lesions involving bilateral iliac bones and left sacral wing. Establish care with Dr. Federico  05/03/2021: biopsy of right iliac lytic lesion showed metastatic carcinoma, consistent with lung primary.  07/08/2021: Cycle 1 Day 1 of Carbo/Pem/Pem 07/29/2021: Cycle 2 Day 1 of Carbo/Pem/Pem 08/11/2021-08/24/2021: Received palliative radiation to osseous metastasis in the left shoulder. 30 Gy in 10 fractions.  08/19/2021: Cycle 3 Day 1 of Carbo/Pem/Pem 09/09/2021: Cycle 4 Day 1 of Carbo/Pem/Pem 09/23/2021: CT CAP: stable disease 09/30/2021: Cycle 5 Day 1 of Pem/Pem 10/21/2021: Cycle 6 Day 1 of Pembrolizumab . Held pemetrexed  due to anemia. 11/16/2021: Cycle 7 Day 1 of Pem/Pem.   12/16/2021: Cycle 8 Day 1 of Pem/Pem.   01/06/2022: Cycle 9 Day 1 of Pem/Pem.  01/27/2022: Cycle 10 Day 1 of Pem/Pem 02/17/2022: Cycle 11 Day 1 of Pem/Pem 03/10/2022: Cycle 12 Day 1 of Pem/Pem (HELD due to anemia with Hgb of 6.4) 03/17/2022: Cycle 12 Day 1 of Pem/Pem (HELD pemextrexed due to cytopenias/kidney function) 04/10/2022: Cycle 13 Day 1 of Pem/Pem (HELD pemextrexed due to cytopenias/kidney function) 04/28/2022: Cycle 14 Day 1 of Pem/Pem  (HELD pemextrexed due to cytopenias/kidney function) 05/23/2022:  Cycle 15 Day 1 of Pem/Pem (HELD pemextrexed due to cytopenias/kidney function) 06/15/2022: Cycle 16 Day 1 of Pembrolizumab .  07/07/2022: Cycle 17 Day 1 of Pembrolizumab .  07/28/2022: Cycle 18 Day 1 of Pembrolizumab  08/18/2022: Cycle 19 Day 1 of Pembrolizumab  HELD due to hyponatremia of 118, new bone metastases. 08/25/2022: Cycle 19 Day 1 of Pembrolizumab  08/28/2022-09/13/2022: Received palliative radiation to right pelvis with total dose of 30 Gy in 10 Fx.  09/22/2022:  Cycle 20 Day 1 of Pembrolizumab  10/13/2022:  Cycle 21 Day 1 of Pembrolizumab  12/11/2022: Cycle 1 Day 1 of Docetaxel /Ramucircumab.  01/05/2023: Cycle 2 Day 1 of Docetaxel /Ramucircumab.  01/26/2023: Cycle 3 Day 1 of Docetaxel /Ramucircumab 02/16/2023:  Cycle 4 Day 1 of Docetaxel /Ramucircumab 03/09/2023: Cycle 5 Day 1 of Docetaxel /Ramucircumab 03/30/2023:  Cycle 6 Day 1 of Docetaxel /Ramucircumab 04/20/2023-04/23/2023: Admitted for slurred speech and weakness. MRI brain concerning for stroke. CTA chest found progression of irregular consolidation in the left apex.  04/27/2023: Cycle 1, Day 1 of Gemcitabine  05/25/2023: Cycle 2, Day 1 of Gemcitabine  06/22/2023: Cycle 3, Day 1 of Gemcitabine   #Adenocarcinoma of the Prostate, [id number] 08/19/2019-10/07/2019: 70 Gy in 28 fractions of 2.5 Gy.  Radiation to the prostate was under the care of Dr. Donnice Barge  Interval History:  Terry Page. 68 y.o. male with medical history significant for metastatic adenocarcinoma of the lung who presents for a follow up visit. ***  MEDICAL HISTORY:  Past Medical History:  Diagnosis Date   Alcohol abuse    Asthma    Bullous emphysema ([name])    Essential hypertension 04/10/2020   Hemorrhoids    History of radiation therapy 04/08/20-04/19/20  IMRT- Left lung- Dr. Lynwood Nasuti   Incidental lung nodule, greater than or equal to 8mm 10/22/2017   Left upper lobe - discovered on CTA   Prostate  cancer ([name])    Spontaneous pneumothorax 10/20/2017   right   Tobacco abuse     SURGICAL HISTORY: Past Surgical History:  Procedure Laterality Date   BACK SURGERY     IR IMAGING GUIDED PORT INSERTION  06/29/2021   PROSTATE BIOPSY      SOCIAL HISTORY: Social History   Socioeconomic History   Marital status: Single    Spouse name: Not on file   Number of children: Not on file   Years of education: Not on file   Highest education level: Not on file  Occupational History   Occupation: retired  Tobacco Use   Smoking status: Former    Current packs/day: 0.00    Types: Cigarettes    Quit date: 11/22/2019    Years since quitting: 3.9   Smokeless tobacco: Never   Tobacco comments:    Patient reports quit 3 years ago. 06/25/20. [facility]  Vaping Use   Vaping status: Never Used  Substance and Sexual Activity   Alcohol use: Yes    Alcohol/week: 12.0 standard drinks of alcohol    Types: 12 Cans of beer per week    Comment: daily sometimes   Drug use: No   Sexual activity: Not Currently  Other Topics Concern   Not on file  Social History Narrative   Not on file   Social Drivers of Health   Financial Resource Strain: Not on file  Food Insecurity: No Food Insecurity (09/12/2023)   Hunger Vital Sign    Worried About Running Out of Food in the Last Year: Never true    Ran Out of Food in the Last Year: Never true  Transportation Needs: No Transportation Needs (09/12/2023)   PRAPARE - Administrator, Civil Service (Medical): No    Lack of Transportation (Non-Medical): No  Physical Activity: Not on file  Stress: Not on file  Social Connections: Moderately Integrated (09/12/2023)   Social Connection and Isolation Panel    Frequency of Communication with Friends and Family: More than three times a week    Frequency of Social Gatherings with Friends and Family: More than three times a week    Attends Religious Services: 1 to 4 times per year    Active Member of [employer] or  Organizations: No    Attends Banker Meetings: Never    Marital Status: Married  Catering manager Violence: Not At Risk (09/12/2023)   Humiliation, Afraid, Rape, and Kick questionnaire    Fear of Current or Ex-Partner: No    Emotionally Abused: No    Physically Abused: No    Sexually Abused: No    FAMILY HISTORY: Family History  Problem Relation Age of Onset   Cancer Cousin        maternal cousin   Cancer Cousin        paternal cousin   Cancer Cousin    Colon polyps Neg Hx    Pancreatic disease Neg Hx    Pancreatic cancer Neg Hx    Breast cancer Neg Hx    Colon cancer Neg Hx     ALLERGIES:  is allergic to ms contin  [morphine ] and roxicodone  [oxycodone ].  MEDICATIONS:  Current Outpatient Medications  Medication Sig Dispense Refill   acetaminophen  (TYLENOL ) 325 MG tablet Take 2 tablets (650 mg total) by mouth every  6 (six) hours as needed for mild pain or headache (fever >/= 101).     albuterol  (VENTOLIN  HFA) 108 (90 Base) MCG/ACT inhaler Inhale 2 puffs into the lungs every 6 (six) hours as needed for wheezing or shortness of breath.     folic acid  (FOLVITE ) 1 MG tablet Take 1 tablet (1 mg total) by mouth daily. 90 tablet 3   hydrOXYzine  (ATARAX ) 25 MG tablet Take 1 tablet (25 mg total) by mouth at bedtime as needed for itching. 30 tablet 11   ipratropium-albuterol  (DUONEB) 0.5-2.5 (3) MG/3ML SOLN Take 3 mLs by nebulization every 6 (six) hours as needed. 360 mL 0   lidocaine  (LIDODERM ) 5 % Place 1 patch onto the skin daily. Remove & Discard patch within 12 hours or as directed by MD. Apply to low back (Patient taking differently: Place 1 patch onto the skin daily as needed (pain).) 30 patch 0   lidocaine -prilocaine  (EMLA ) cream Apply 1 Application topically as needed. 30 g 0   mirtazapine  (REMERON ) 7.5 MG tablet Take 1 tablet (7.5 mg total) by mouth at bedtime. 30 tablet 0   polyethylene glycol powder (MIRALAX ) 17 GM/SCOOP powder Dissolve 1 capful (17g) in 4-8 ounces  of liquid and take by mouth daily. 238 g 2   senna-docusate (STIMULANT LAXATIVE) 8.6-50 MG tablet Take 2 tablets by mouth 2 (two) times daily. May add additional tablet as needed 150 tablet 3   traMADol  (ULTRAM ) 50 MG tablet Take 1-2 tablets (50-100 mg total) by mouth every 6 (six) hours as needed. 120 tablet 0   No current facility-administered medications for this visit.    REVIEW OF SYSTEMS:   Constitutional: ( - ) fevers, ( - )  chills , ( - ) night sweats Eyes: ( - ) blurriness of vision, ( - ) double vision, ( - ) watery eyes Ears, nose, mouth, throat, and face: ( - ) mucositis, ( - ) sore throat Respiratory: ( - ) cough, ( - ) dyspnea, ( - ) wheezes Cardiovascular: ( - ) palpitation, ( - ) chest discomfort, ( - ) lower extremity swelling Gastrointestinal:  ( - ) nausea, ( - ) heartburn, ( - ) change in bowel habits Skin: ( - ) abnormal skin rashes Lymphatics: ( - ) new lymphadenopathy, ( - ) easy bruising Neurological: ( - ) numbness, ( - ) tingling, ( - ) new weaknesses Behavioral/Psych: ( - ) mood change, ( - ) new changes  All other systems were reviewed with the patient and are negative.  PHYSICAL EXAMINATION: ECOG PERFORMANCE STATUS: 1 - Symptomatic but completely ambulatory  There were no vitals filed for this visit.   There were no vitals filed for this visit.    GENERAL: Well-appearing elderly African-American male, alert, no distress and comfortable SKIN: skin color, texture, turgor are normal, no rashes or significant lesions EYES: conjunctiva are pink and non-injected, sclera clear LUNGS: clear to auscultation and percussion with normal breathing effort HEART: regular rate & rhythm and no murmurs and no lower extremity edema Musculoskeletal: no cyanosis of digits and no clubbing  PSYCH: alert & oriented x 3, fluent speech NEURO: no focal motor/sensory deficits  LABORATORY DATA:  I have reviewed the data as listed    Latest Ref Rng & Units 09/16/2023    4:46  AM 09/15/2023   11:07 AM 09/14/2023    1:09 AM  CBC  WBC 4.0 - 10.5 K/uL 11.2  15.1  12.8   Hemoglobin 13.0 - 17.0 g/dL 12.0  10.3  10.3   Hematocrit 39.0 - 52.0 % 37.4  33.7  33.0   Platelets 150 - 400 K/uL 383  317  345        Latest Ref Rng & Units 09/16/2023    4:46 AM 09/15/2023   11:07 AM 09/14/2023    1:09 AM  CMP  Glucose 70 - 99 mg/dL 858  878  887   BUN 8 - 23 mg/dL 10  11  13    Creatinine 0.61 - 1.24 mg/dL 9.20  9.17  9.20   Sodium 135 - 145 mmol/L 132  135  131   Potassium 3.5 - 5.1 mmol/L 3.7  3.1  3.1   Chloride 98 - 111 mmol/L 94  100  97   CO2 22 - 32 mmol/L 20  23  23    Calcium  8.9 - 10.3 mg/dL 9.8  9.6  9.6   Total Protein 6.5 - 8.1 g/dL  7.3    Total Bilirubin 0.0 - 1.2 mg/dL  0.4    Alkaline Phos 38 - 126 U/L  68    AST 15 - 41 U/L  18    ALT 0 - 44 U/L  7      Lab Results  Component Value Date   MPROTEIN Not Observed 04/28/2021   Lab Results  Component Value Date   KPAFRELGTCHN 33.6 (H) 04/28/2021   LAMBDASER 22.0 04/28/2021   KAPLAMBRATIO 1.53 04/28/2021    RADIOGRAPHIC STUDIES: No results found.     ASSESSMENT & PLAN Terry Page. Is a 68 y.o.  male with medical history significant for metastatic adenocarcinoma of the lung who presents for a follow up visit.  # Metastatic Adenocarcinoma of the Lung  -- NGS testing from this patient shows no evidence of targetable mutation. --MRI brain shows no evidence of intracranial metastases --Started carboplatin , pembrolizumab , and pemetrexed  as treatment for his cancer on 07/08/2021. --Received palliative radiation to osseous metastasis of the left shoulder from 08/11/2021-08/24/2021. Planned dose is 30 Gy in 10 fx.  --Discontinued Pemetrexed  on 03/17/2022 due to persistent anemia and kidney dysfunction. --Restaging CT CAP from 08/09/2022 shows interval enlargement of multiple mixed lytic and sclerotic bone metastases involving the right pelvis. Other bone metastases including of the left scapula,  sacrum, and left hemipelvis are unchanged. Unchanged treated central LUL mass. Recommend to continue on pembrolizumab  therapy and arrange for palliative radiation to enlarging bone metastases.  --CTA chest from 04/20/2023 showed progression of disease to the consolidation in the left apex. Recommended to change therapy  to single agent Gemcitabine  3 weeks on, 1 week off.  --Started Cycle 1, Day 1 of Gemcitabine  on 04/27/2023 Plan: --Gemcitabine  HELD due to progression seen on CT scan from 07/16/2023.  --labs from today were reviewed. Labs show *** --patient progressed on 3rd line therapy, no targetable mutations. Options are best supportive care or clinical trial. No active trials for his situation locally. ***  #Left sided low back pain with sciatica: --History of lumbar spondylosis and moderate to severe facet arthrosis without spinal stenosis and mild right neural foraminal stenosis at L4-5 seen on MRI lumbar 04/24/2020.   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  --Pain is slightly improved but still present.  --Continue to take tramadol  50-100 mg q 6 hours for pain control.   #Hyponatremia-stable --Chronic in nature --Sodium  level dropped from 127 on 08/09/2022 to 118.  Today sodium is stable at 132 --Etiologies including SIADH, immunotherapy induced, too much water intake.  --Patient is asymptomatic without nausea/vomiting, headaches, confusion, seizures, etc --Currently on salt tablets 1gm three times daily.  --Strict ED precautions given for any new symptoms as discussed above.   # Iron Deficiency Anemia #Chemotherapy induced anemia --Currently on ferrous sulfate  325 mg PO daily --No evidence of iron/b12/folate deficiency per labs from 04/22/2023.    #Supportive Care -- chemotherapy education  complete -- port placed -- zofran  8mg  q8H PRN and compazine  10mg  PO q6H for nausea -- EMLA  cream for port   Orders Placed This Encounter  Procedures   CBC with Differential (Cancer Center Only)    Standing Status:   Future    Expiration Date:   10/21/2024   CMP (Cancer Center only)    Standing Status:   Future    Expiration Date:   10/21/2024    All questions were answered. The patient knows to call the clinic with any problems, questions or concerns.  I have spent a total of 25 minutes minutes of face-to-face and non-face-to-face time, preparing to see the patient, performing a medically appropriate examination, counseling and educating the patient, referring and communicating with other health care professionals, documenting clinical information in the electronic health record, and care coordination.   Johnston Police PA-C Dept of Hematology and Oncology [facility] Phone: [phone]   10/22/2023 9:34 PM

## 2023-10-23 ENCOUNTER — Inpatient Hospital Stay: Admitting: Physician Assistant

## 2023-10-23 ENCOUNTER — Inpatient Hospital Stay

## 2023-10-23 ENCOUNTER — Other Ambulatory Visit: Payer: Self-pay | Admitting: Nurse Practitioner

## 2023-10-23 VITALS — BP 120/83 | HR 104 | Temp 97.3°F | Resp 17 | Wt 129.8 lb

## 2023-10-23 DIAGNOSIS — C7931 Secondary malignant neoplasm of brain: Secondary | ICD-10-CM | POA: Insufficient documentation

## 2023-10-23 DIAGNOSIS — I10 Essential (primary) hypertension: Secondary | ICD-10-CM | POA: Insufficient documentation

## 2023-10-23 DIAGNOSIS — T451X5D Adverse effect of antineoplastic and immunosuppressive drugs, subsequent encounter: Secondary | ICD-10-CM | POA: Insufficient documentation

## 2023-10-23 DIAGNOSIS — C7951 Secondary malignant neoplasm of bone: Secondary | ICD-10-CM

## 2023-10-23 DIAGNOSIS — G893 Neoplasm related pain (acute) (chronic): Secondary | ICD-10-CM | POA: Insufficient documentation

## 2023-10-23 DIAGNOSIS — C3490 Malignant neoplasm of unspecified part of unspecified bronchus or lung: Secondary | ICD-10-CM

## 2023-10-23 DIAGNOSIS — C61 Malignant neoplasm of prostate: Secondary | ICD-10-CM | POA: Insufficient documentation

## 2023-10-23 DIAGNOSIS — E871 Hypo-osmolality and hyponatremia: Secondary | ICD-10-CM | POA: Insufficient documentation

## 2023-10-23 DIAGNOSIS — Z923 Personal history of irradiation: Secondary | ICD-10-CM | POA: Insufficient documentation

## 2023-10-23 DIAGNOSIS — M84522A Pathological fracture in neoplastic disease, left humerus, initial encounter for fracture: Secondary | ICD-10-CM | POA: Diagnosis not present

## 2023-10-23 DIAGNOSIS — D6481 Anemia due to antineoplastic chemotherapy: Secondary | ICD-10-CM | POA: Insufficient documentation

## 2023-10-23 DIAGNOSIS — Z87891 Personal history of nicotine dependence: Secondary | ICD-10-CM | POA: Insufficient documentation

## 2023-10-23 DIAGNOSIS — C3412 Malignant neoplasm of upper lobe, left bronchus or lung: Secondary | ICD-10-CM | POA: Insufficient documentation

## 2023-10-23 DIAGNOSIS — Z809 Family history of malignant neoplasm, unspecified: Secondary | ICD-10-CM | POA: Insufficient documentation

## 2023-10-23 DIAGNOSIS — Z79891 Long term (current) use of opiate analgesic: Secondary | ICD-10-CM | POA: Insufficient documentation

## 2023-10-23 DIAGNOSIS — D649 Anemia, unspecified: Secondary | ICD-10-CM

## 2023-10-23 DIAGNOSIS — Z515 Encounter for palliative care: Secondary | ICD-10-CM

## 2023-10-23 LAB — CMP (CANCER CENTER ONLY)
ALT: 9 U/L (ref 0–44)
AST: 19 U/L (ref 15–41)
Albumin: 3.8 g/dL (ref 3.5–5.0)
Alkaline Phosphatase: 71 U/L (ref 38–126)
Anion gap: 9 (ref 5–15)
BUN: 19 mg/dL (ref 8–23)
CO2: 28 mmol/L (ref 22–32)
Calcium: 10.7 mg/dL — ABNORMAL HIGH (ref 8.9–10.3)
Chloride: 96 mmol/L — ABNORMAL LOW (ref 98–111)
Creatinine: 1.27 mg/dL — ABNORMAL HIGH (ref 0.61–1.24)
GFR, Estimated: >60 mL/min (ref >60)
Glucose, Bld: 98 mg/dL (ref 70–99)
Potassium: 4 mmol/L (ref 3.5–5.1)
Sodium: 133 mmol/L — ABNORMAL LOW (ref 135–145)
Total Bilirubin: 0.3 mg/dL (ref 0.0–1.2)
Total Protein: 7.8 g/dL (ref 6.5–8.1)

## 2023-10-23 LAB — CBC WITH DIFFERENTIAL (CANCER CENTER ONLY)
Abs Immature Granulocytes: 0.11 K/uL — ABNORMAL HIGH (ref 0.00–0.07)
Basophils Absolute: 0.1 K/uL (ref 0.0–0.1)
Basophils Relative: 1 %
Eosinophils Absolute: 0.3 K/uL (ref 0.0–0.5)
Eosinophils Relative: 3 %
HCT: 40 % (ref 39.0–52.0)
Hemoglobin: 12.9 g/dL — ABNORMAL LOW (ref 13.0–17.0)
Immature Granulocytes: 1 %
Lymphocytes Relative: 16 %
Lymphs Abs: 1.7 K/uL (ref 0.7–4.0)
MCH: 26.7 pg (ref 26.0–34.0)
MCHC: 32.3 g/dL (ref 30.0–36.0)
MCV: 82.6 fL (ref 80.0–100.0)
Monocytes Absolute: 1 K/uL (ref 0.1–1.0)
Monocytes Relative: 9 %
Neutro Abs: 7.9 K/uL — ABNORMAL HIGH (ref 1.7–7.7)
Neutrophils Relative %: 70 %
Platelet Count: 382 K/uL (ref 150–400)
RBC: 4.84 MIL/uL (ref 4.22–5.81)
RDW: 20.9 % — ABNORMAL HIGH (ref 11.5–15.5)
WBC Count: 11.1 K/uL — ABNORMAL HIGH (ref 4.0–10.5)
nRBC: 0 % (ref 0.0–0.2)

## 2023-10-23 LAB — SAMPLE TO BLOOD BANK

## 2023-10-23 MED ORDER — TRAMADOL HCL 50 MG PO TABS: 50.0000 mg | ORAL_TABLET | Freq: Four times a day (QID) | ORAL | 0 refills | Status: DC | PRN

## 2023-10-23 NOTE — Progress Notes (Signed)
 [facility] [phone]   [phone]  PROGRESS NOTE  Patient Care Team: Rolinda Millman, MD as PCP - General (Family Medicine)  Hematological/Oncological History # Metastatic Adenocarcinoma of the Lung  01/07/2020 : CT of the chest with contrast performed which showed interval enlargement of a spiculated nodule in the central left upper lobe measuring 1.2 x 1.0 cm and highly suspicious for primary lung malignancy 02/06/2020 :PET scan performed which showed a hypermetabolic irregular solid 1.3 cm left upper lobe pulmonary nodule compatible with malignancy without any other hypermetabolic lesions 05/04/7975- 04/19/2020:  SBRT to the lung lesion 04/28/2021: CT C/A/P showed multifocal lytic bone metastases are identified. Lesion within the spine of the left scapula and lesions involving bilateral iliac bones and left sacral wing. Establish care with Dr. Federico  05/03/2021: biopsy of right iliac lytic lesion showed metastatic carcinoma, consistent with lung primary.  07/08/2021: Cycle 1 Day 1 of Carbo/Pem/Pem 07/29/2021: Cycle 2 Day 1 of Carbo/Pem/Pem 08/11/2021-08/24/2021: Received palliative radiation to osseous metastasis in the left shoulder. 30 Gy in 10 fractions.  08/19/2021: Cycle 3 Day 1 of Carbo/Pem/Pem 09/09/2021: Cycle 4 Day 1 of Carbo/Pem/Pem 09/23/2021: CT CAP: stable disease 09/30/2021: Cycle 5 Day 1 of Pem/Pem 10/21/2021: Cycle 6 Day 1 of Pembrolizumab . Held pemetrexed  due to anemia. 11/16/2021: Cycle 7 Day 1 of Pem/Pem.   12/16/2021: Cycle 8 Day 1 of Pem/Pem.   01/06/2022: Cycle 9 Day 1 of Pem/Pem.  01/27/2022: Cycle 10 Day 1 of Pem/Pem 02/17/2022: Cycle 11 Day 1 of Pem/Pem 03/10/2022: Cycle 12 Day 1 of Pem/Pem (HELD due to anemia with Hgb of 6.4) 03/17/2022: Cycle 12 Day 1 of Pem/Pem (HELD pemextrexed due to cytopenias/kidney function) 04/10/2022: Cycle 13 Day 1 of Pem/Pem (HELD pemextrexed due to cytopenias/kidney function) 04/28/2022: Cycle 14 Day 1 of Pem/Pem  (HELD pemextrexed due to cytopenias/kidney function) 05/23/2022:  Cycle 15 Day 1 of Pem/Pem (HELD pemextrexed due to cytopenias/kidney function) 06/15/2022: Cycle 16 Day 1 of Pembrolizumab .  07/07/2022: Cycle 17 Day 1 of Pembrolizumab .  07/28/2022: Cycle 18 Day 1 of Pembrolizumab  08/18/2022: Cycle 19 Day 1 of Pembrolizumab  HELD due to hyponatremia of 118, new bone metastases. 08/25/2022: Cycle 19 Day 1 of Pembrolizumab  08/28/2022-09/13/2022: Received palliative radiation to right pelvis with total dose of 30 Gy in 10 Fx.  09/22/2022:  Cycle 20 Day 1 of Pembrolizumab  10/13/2022:  Cycle 21 Day 1 of Pembrolizumab  12/11/2022: Cycle 1 Day 1 of Docetaxel /Ramucircumab.  01/05/2023: Cycle 2 Day 1 of Docetaxel /Ramucircumab.  01/26/2023: Cycle 3 Day 1 of Docetaxel /Ramucircumab 02/16/2023:  Cycle 4 Day 1 of Docetaxel /Ramucircumab 03/09/2023: Cycle 5 Day 1 of Docetaxel /Ramucircumab 03/30/2023:  Cycle 6 Day 1 of Docetaxel /Ramucircumab 04/20/2023-04/23/2023: Admitted for slurred speech and weakness. MRI brain concerning for stroke. CTA chest found progression of irregular consolidation in the left apex.  04/27/2023: Cycle 1, Day 1 of Gemcitabine  05/25/2023: Cycle 2, Day 1 of Gemcitabine  06/22/2023: Cycle 3, Day 1 of Gemcitabine   #Adenocarcinoma of the Prostate, T1cN0M0 08/19/2019-10/07/2019: 70 Gy in 28 fractions of 2.5 Gy.  Radiation to the prostate was under the care of Dr. Donnice Barge  Interval History:  Terry Page. 68 y.o. male with medical history significant for metastatic adenocarcinoma of the lung who presents for a follow up visit. The patient's last visit was on 09/05/2023. In the interm, he was hospitalized from 09/12/2023-09/17/2023 and found to have progressive disease with new brain metastases. He was discharged on hospice care.   On exam today, Terry Page reports overall he is doing  well while on hospice care. He is not having any pain or discomfort. His weight has been fairly stable since last month.  He denies nausea, vomiting or bowel habit changes. He denies easy bruising or signs of bleeding. His pain is well controlled with tramadol  q 6 hours and lidocaine  patch. He denies fevers, chills, sweats, shortness of breath, chest pain or cough. He has no other complaints. Rest of the ROS is below.   MEDICAL HISTORY:  Past Medical History:  Diagnosis Date   Alcohol abuse    Asthma    Brain tumor ([name])    Bullous emphysema ([name])    Essential hypertension 04/10/2020   Hemorrhoids    History of radiation therapy 04/08/20-04/19/20   IMRT- Left lung- Dr. Lynwood Nasuti   Incidental lung nodule, greater than or equal to 8mm 10/22/2017   Left upper lobe - discovered on CTA   Prostate cancer ([name])    Spontaneous pneumothorax 10/20/2017   right   Tobacco abuse     SURGICAL HISTORY: Past Surgical History:  Procedure Laterality Date   BACK SURGERY     IR IMAGING GUIDED PORT INSERTION  06/29/2021   PROSTATE BIOPSY      SOCIAL HISTORY: Social History   Socioeconomic History   Marital status: Single    Spouse name: Not on file   Number of children: Not on file   Years of education: Not on file   Highest education level: Not on file  Occupational History   Occupation: retired  Tobacco Use   Smoking status: Former    Current packs/day: 0.00    Types: Cigarettes    Quit date: 11/22/2019    Years since quitting: 3.9   Smokeless tobacco: Never   Tobacco comments:    Patient reports quit 3 years ago. 06/25/20. [facility]  Vaping Use   Vaping status: Never Used  Substance and Sexual Activity   Alcohol use: Yes    Alcohol/week: 12.0 standard drinks of alcohol    Types: 12 Cans of beer per week    Comment: daily sometimes   Drug use: No   Sexual activity: Not Currently  Other Topics Concern   Not on file  Social History Narrative   Not on file   Social Drivers of Health   Financial Resource Strain: Not on file  Food Insecurity: No Food Insecurity (10/27/2023)   Hunger Vital Sign     Worried About Running Out of Food in the Last Year: Never true    Ran Out of Food in the Last Year: Never true  Transportation Needs: No Transportation Needs (10/27/2023)   PRAPARE - Administrator, Civil Service (Medical): No    Lack of Transportation (Non-Medical): No  Physical Activity: Not on file  Stress: Not on file  Social Connections: Socially Isolated (10/27/2023)   Social Connection and Isolation Panel    Frequency of Communication with Friends and Family: More than three times a week    Frequency of Social Gatherings with Friends and Family: More than three times a week    Attends Religious Services: Never    Database Administrator or Organizations: No    Attends Banker Meetings: Never    Marital Status: Never married  Intimate Partner Violence: Not At Risk (10/27/2023)   Humiliation, Afraid, Rape, and Kick questionnaire    Fear of Current or Ex-Partner: No    Emotionally Abused: No    Physically Abused: No    Sexually Abused: No  FAMILY HISTORY: Family History  Problem Relation Age of Onset   Cancer Cousin        maternal cousin   Cancer Cousin        paternal cousin   Cancer Cousin    Colon polyps Neg Hx    Pancreatic disease Neg Hx    Pancreatic cancer Neg Hx    Breast cancer Neg Hx    Colon cancer Neg Hx     ALLERGIES:  is allergic to ms contin  [morphine ] and roxicodone  [oxycodone ].  MEDICATIONS:  No current facility-administered medications for this visit.   Current Outpatient Medications  Medication Sig Dispense Refill   chlorhexidine  (HIBICLENS ) 4 % external liquid Apply 15 mLs (1 Application total) topically as directed for 30 doses. Use as directed daily for 5 days every other week for 6 weeks. 946 mL 1   mupirocin ointment (BACTROBAN) 2 % Place 1 Application into the nose 2 (two) times daily for 60 doses. Use as directed 2 times daily for 5 days every other week for 6 weeks. 60 g 0   Facility-Administered Medications  Ordered in Other Visits  Medication Dose Route Frequency Provider Last Rate Last Admin   acetaminophen  (TYLENOL ) tablet 1,000 mg  1,000 mg Oral TID Danton Lauraine LABOR, PA-C   1,000 mg at 10/30/23 1040   albuterol  (PROVENTIL ) (2.5 MG/3ML) 0.083% nebulizer solution 3 mL  3 mL Inhalation Q6H PRN Danton Lauraine LABOR, PA-C       Chlorhexidine  Gluconate Cloth 2 % PADS 6 each  6 each Topical Daily Danton Lauraine LABOR, PA-C   6 each at 10/30/23 1040   cholecalciferol (VITAMIN D3) 25 MCG (1000 UNIT) tablet 1,000 Units  1,000 Units Oral Daily Danton Lauraine LABOR, PA-C   1,000 Units at 10/30/23 1041   diphenhydrAMINE  (BENADRYL ) 12.5 MG/5ML elixir 12.5-25 mg  12.5-25 mg Oral Q4H PRN Danton Lauraine LABOR, PA-C       docusate sodium  (COLACE) capsule 100 mg  100 mg Oral BID Danton Lauraine A, PA-C   100 mg at 10/30/23 1042   enoxaparin  (LOVENOX ) injection 40 mg  40 mg Subcutaneous Q24H Danton Lauraine A, PA-C   40 mg at 10/30/23 1041   feeding supplement (ENSURE PLUS HIGH PROTEIN) liquid 237 mL  237 mL Oral BID BM Danton Lauraine A, PA-C   237 mL at 10/30/23 1041   HYDROmorphone  (DILAUDID ) injection 0.5-1 mg  0.5-1 mg Intravenous Q3H PRN Danton Lauraine LABOR, PA-C   0.5 mg at 10/29/23 1857   lidocaine  (LIDODERM ) 5 % 1 patch  1 patch Transdermal Daily PRN Danton Lauraine LABOR, PA-C   1 patch at 10/30/23 1431   methocarbamol (ROBAXIN) tablet 500 mg  500 mg Oral Q6H PRN Danton Lauraine LABOR, PA-C   500 mg at 10/30/23 1458   Or   methocarbamol (ROBAXIN) injection 500 mg  500 mg Intravenous Q6H PRN Danton Lauraine LABOR, PA-C   500 mg at 10/29/23 2010   metoCLOPramide (REGLAN) tablet 5-10 mg  5-10 mg Oral Q8H PRN Danton Lauraine LABOR, PA-C       Or   metoCLOPramide (REGLAN) injection 5-10 mg  5-10 mg Intravenous Q8H PRN Danton Lauraine LABOR, PA-C       mirtazapine  (REMERON ) tablet 7.5 mg  7.5 mg Oral QHS Danton Lauraine A, PA-C   7.5 mg at 10/29/23 2151   mupirocin ointment (BACTROBAN) 2 % 1 Application  1 Application Nasal BID Danton Lauraine LABOR, PA-C   1  Application at 10/30/23 1040   ondansetron  (  ZOFRAN ) tablet 4 mg  4 mg Oral Q6H PRN Danton Lauraine LABOR, PA-C       Or   ondansetron  (ZOFRAN ) injection 4 mg  4 mg Intravenous Q6H PRN Danton, Sarah A, PA-C       polyethylene glycol (MIRALAX  / GLYCOLAX ) packet 17 g  17 g Oral Daily Danton Lauraine LABOR, PA-C   17 g at 10/30/23 1041   polyethylene glycol (MIRALAX  / GLYCOLAX ) packet 17 g  17 g Oral Daily PRN Danton Lauraine LABOR, PA-C       senna-docusate (Senokot-S) tablet 2 tablet  2 tablet Oral BID Danton Lauraine LABOR, PA-C   2 tablet at 10/30/23 1041   traMADol  (ULTRAM ) tablet 50-100 mg  50-100 mg Oral Q6H PRN Danton Lauraine LABOR, PA-C   50 mg at 10/30/23 1433    REVIEW OF SYSTEMS:   Constitutional: ( - ) fevers, ( - )  chills , ( - ) night sweats Eyes: ( - ) blurriness of vision, ( - ) double vision, ( - ) watery eyes Ears, nose, mouth, throat, and face: ( - ) mucositis, ( - ) sore throat Respiratory: ( - ) cough, ( - ) dyspnea, ( - ) wheezes Cardiovascular: ( - ) palpitation, ( - ) chest discomfort, ( - ) lower extremity swelling Gastrointestinal:  ( - ) nausea, ( - ) heartburn, ( - ) change in bowel habits Skin: ( - ) abnormal skin rashes Lymphatics: ( - ) new lymphadenopathy, ( - ) easy bruising Neurological: ( - ) numbness, ( - ) tingling, ( - ) new weaknesses Behavioral/Psych: ( - ) mood change, ( - ) new changes  All other systems were reviewed with the patient and are negative.  PHYSICAL EXAMINATION: ECOG PERFORMANCE STATUS: 1 - Symptomatic but completely ambulatory  Vitals:   10/23/23 0959  BP: 120/83  Pulse: (!) 104  Resp: 17  Temp: (!) 97.3 F (36.3 C)  SpO2: 96%    Filed Weights   10/23/23 0959  Weight: 129 lb 12.8 oz (58.9 kg)     GENERAL: Well-appearing elderly African-American male, alert, no distress and comfortable SKIN: skin color, texture, turgor are normal, no rashes or significant lesions EYES: conjunctiva are pink and non-injected, sclera clear LUNGS: clear to  auscultation and percussion with normal breathing effort HEART: regular rate & rhythm and no murmurs and no lower extremity edema Musculoskeletal: no cyanosis of digits and no clubbing  PSYCH: alert & oriented x 3, fluent speech NEURO: no focal motor/sensory deficits  LABORATORY DATA:  I have reviewed the data as listed    Latest Ref Rng & Units 10/30/2023    5:26 AM 10/26/2023    9:43 PM 10/23/2023    9:42 AM  CBC  WBC 4.0 - 10.5 K/uL 20.4  17.4  11.1   Hemoglobin 13.0 - 17.0 g/dL 88.0  88.3  87.0   Hematocrit 39.0 - 52.0 % 36.5  37.3  40.0   Platelets 150 - 400 K/uL 398  391  382        Latest Ref Rng & Units 10/26/2023    9:43 PM 10/23/2023    9:42 AM 09/16/2023    4:46 AM  CMP  Glucose 70 - 99 mg/dL 897  98  858   BUN 8 - 23 mg/dL 13  19  10    Creatinine 0.61 - 1.24 mg/dL 9.03  8.72  9.20   Sodium 135 - 145 mmol/L 130  133  132   Potassium 3.5 -  5.1 mmol/L 4.7  4.0  3.7   Chloride 98 - 111 mmol/L 97  96  94   CO2 22 - 32 mmol/L 22  28  20    Calcium  8.9 - 10.3 mg/dL 9.6  89.2  9.8   Total Protein 6.5 - 8.1 g/dL  7.8    Total Bilirubin 0.0 - 1.2 mg/dL  0.3    Alkaline Phos 38 - 126 U/L  71    AST 15 - 41 U/L  19    ALT 0 - 44 U/L  9      Lab Results  Component Value Date   MPROTEIN Not Observed 04/28/2021   Lab Results  Component Value Date   KPAFRELGTCHN 33.6 (H) 04/28/2021   LAMBDASER 22.0 04/28/2021   KAPLAMBRATIO 1.53 04/28/2021    RADIOGRAPHIC STUDIES: DG Humerus Left Result Date: 10/29/2023 CLINICAL DATA:  Fracture, postop. EXAM: DG HUMERUS 2V *L* COMPARISON:  Preoperative imaging FINDINGS: Postoperative fixation of distal humeral shaft fracture, suspected pathologic. Improved alignment from preoperative imaging. Recent postsurgical change includes air and edema in the overlying soft tissues. IMPRESSION: Postoperative fixation of distal humeral shaft fracture. Electronically Signed   By: Andrea Gasman M.D.   On: 10/29/2023 15:00   DG Humerus  Left Result Date: 10/29/2023 CLINICAL DATA:  Elective surgery. EXAM: DG HUMERUS 2V *L* COMPARISON:  Radiograph 10/26/2023 FINDINGS: Six fluoroscopic spot views of the humerus submitted from the operating room. Cylindrical metallic density traverses the left humerus including distal humeral fracture. Fluoroscopy time 3 minutes 32 seconds. Dose 3.29 mGy. IMPRESSION: Intraoperative fluoroscopy during left humerus surgery. Electronically Signed   By: Andrea Gasman M.D.   On: 10/29/2023 14:59   DG C-Arm 1-60 Min-No Report Result Date: 10/29/2023 Fluoroscopy was utilized by the requesting physician.  No radiographic interpretation.   DG C-Arm 1-60 Min-No Report Result Date: 10/29/2023 Fluoroscopy was utilized by the requesting physician.  No radiographic interpretation.   DG Humerus Left Result Date: 10/26/2023 CLINICAL DATA:  Status post fall with left arm pain. Patient with history of metastatic lung cancer. EXAM: LEFT HUMERUS - 2+ VIEW COMPARISON:  None Available. FINDINGS: Angulated distal humeral shaft fracture, likely pathologic with suspected underlying lucent lesion. Mild apex lateral angulation. There is no intra-articular involvement. Elbow and shoulder alignment are maintained. Mild soft tissue edema at the fracture site. IMPRESSION: Angulated distal humeral shaft fracture, likely pathologic with suspected underlying lucent lesion. Electronically Signed   By: Andrea Gasman M.D.   On: 10/26/2023 18:12   CT Cervical Spine Wo Contrast Result Date: 10/26/2023 CLINICAL DATA:  Neck trauma, unwitnessed fall. EXAM: CT CERVICAL SPINE WITHOUT CONTRAST TECHNIQUE: Multidetector CT imaging of the cervical spine was performed without intravenous contrast. Multiplanar CT image reconstructions were also generated. RADIATION DOSE REDUCTION: This exam was performed according to the departmental dose-optimization program which includes automated exposure control, adjustment of the mA and/or kV according to  patient size and/or use of iterative reconstruction technique. COMPARISON:  Brain MRI 09/15/2023 reviewed FINDINGS: Alignment: Normal. Skull base and vertebrae: No acute fracture. Vertebral body heights are maintained. The dens and skull base are intact. Vague area of sclerosis in the right aspect of C1 corresponding to suspected lesion on prior MRI no extraosseous mass effect. Incidental non fusion posterior arch of C1. Soft tissues and spinal canal: No prevertebral fluid or swelling. No visible canal hematoma. Disc levels: Multilevel degenerative disc disease and facet hypertrophy. Upper chest: Advanced emphysema. Circumferential pleural thickening at the right lung apex. Radiopaque cul pulmonary nodules.  Patient with known metastatic lung cancer. Other: Carotid and vertebral artery calcifications. IMPRESSION: 1. No acute fracture or subluxation of the cervical spine. 2. Vague area of sclerosis in the right aspect of C1 corresponding to suspected lesion on prior MRI. No extraosseous mass effect. 3. Multilevel degenerative disc disease and facet hypertrophy. Emphysema (ICD10-J43.9). Electronically Signed   By: Andrea Gasman M.D.   On: 10/26/2023 18:06   CT Head Wo Contrast Result Date: 10/26/2023 CLINICAL DATA:  Head trauma, minor (Age >= 65y) Unwitnessed fall. EXAM: CT HEAD WITHOUT CONTRAST TECHNIQUE: Contiguous axial images were obtained from the base of the skull through the vertex without intravenous contrast. RADIATION DOSE REDUCTION: This exam was performed according to the departmental dose-optimization program which includes automated exposure control, adjustment of the mA and/or kV according to patient size and/or use of iterative reconstruction technique. COMPARISON:  Head CT and brain MRI 09/15/2023 FINDINGS: Brain: Known intracranial metastasis. There is edema within the left cerebellum, similar to prior exam corresponding to largest metastatic lesion. There is similar effacement of the fourth  ventricle from prior exam. There is edema within the left occipital lobe at site of metastatic lesion on MRI. Enhancing lesion abutting the falx is faintly visualized in the left parietal lobe, series 3, image 21. Small focus of hyperdensity in the right parietal lobe corresponds to a known metastatic lesion. Many of the metastatic lesions are not well demonstrated by CT. No evidence of hemorrhage or subdural collection. Stable brain volume and periventricular chronic small vessel ischemia. Chronic lacunar infarct in left corona radiata. Vascular: Atherosclerosis of skullbase vasculature without hyperdense vessel or abnormal calcification. Skull: No skull fracture. The left parietal calvarial lesion on MRI is not well demonstrated by CT. Sinuses/Orbits: Scattered mucosal thickening. No acute finding. No mastoid effusion. Other: No confluent scalp contusion. IMPRESSION: 1. No acute intracranial abnormality. No skull fracture. 2. Known intracranial metastasis. There is edema within the left cerebellum and left occipital lobe corresponding to known metastatic lesions, similar to CT last month. There is similar effacement of the fourth ventricle from prior exam. Electronically Signed   By: Andrea Gasman M.D.   On: 10/26/2023 17:59       ASSESSMENT & PLAN Terry Page. Is a 68 y.o.  male with medical history significant for metastatic adenocarcinoma of the lung who presents for a follow up visit.  # Metastatic Adenocarcinoma of the Lung  -- NGS testing from this patient shows no evidence of targetable mutation. --MRI brain shows no evidence of intracranial metastases --Started carboplatin , pembrolizumab , and pemetrexed  as treatment for his cancer on 07/08/2021. --Received palliative radiation to osseous metastasis of the left shoulder from 08/11/2021-08/24/2021. Planned dose is 30 Gy in 10 fx.  --Discontinued Pemetrexed  on 03/17/2022 due to persistent anemia and kidney dysfunction. --Restaging CT CAP  from 08/09/2022 shows interval enlargement of multiple mixed lytic and sclerotic bone metastases involving the right pelvis. Other bone metastases including of the left scapula, sacrum, and left hemipelvis are unchanged. Unchanged treated central LUL mass. Recommend to continue on pembrolizumab  therapy and arrange for palliative radiation to enlarging bone metastases.  --CTA chest from 04/20/2023 showed progression of disease to the consolidation in the left apex. Recommended to change therapy  to single agent Gemcitabine  3 weeks on, 1 week off.  --Started Cycle 1, Day 1 of Gemcitabine  on 04/27/2023 Plan: --Gemcitabine  HELD due to progression seen on CT scan from 07/16/2023.  --patient progressed on 3rd line therapy, no targetable mutations. Most recently hospitalized from  09/12/23-09/17/2023 with evidence of new brain metastases.  GOC were discussed and patient was discharged in hospice care.  --labs from today were reviewed and require no intervention. WBC 11.1, Hgb 12.9, Plt 382, creatinine 1.27, LFTs normal.  --Re-discussed that surgical resection of brain metastases would not be appropriate with additional sites of metastatic disease. We can consider palliative radiation if patient is symptomatic.  --Recommend to continue in hospice care.  --Tentative placeholder in 4 weeks for continued monitoring.   #Left sided low back pain with sciatica: --History of lumbar spondylosis and moderate to severe facet arthrosis without spinal stenosis and mild right neural foraminal stenosis at L4-5 seen on MRI lumbar 04/24/2020.   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  --Pain is slightly improved but still present.  --Continue to take tramadol  50-100 mg q 6 hours for pain control.   #Hyponatremia-stable --Chronic in  nature --Sodium level dropped from 127 on 08/09/2022 to 118.  Today sodium is stable at 133 --Etiologies including SIADH, immunotherapy induced, too much water intake.  --Patient is asymptomatic without nausea/vomiting, headaches, confusion, seizures, etc --Currently on salt tablets 1gm three times daily.  --Strict ED precautions given for any new symptoms as discussed above.   # Iron Deficiency Anemia #Chemotherapy induced anemia --Currently on ferrous sulfate  325 mg PO daily --No evidence of iron/b12/folate deficiency per labs from 04/22/2023.    #Supportive Care -- chemotherapy education complete -- port placed -- zofran  8mg  q8H PRN and compazine  10mg  PO q6H for nausea -- EMLA  cream for port   No orders of the defined types were placed in this encounter.   All questions were answered. The patient knows to call the clinic with any problems, questions or concerns.  I have spent a total of 25 minutes minutes of face-to-face and non-face-to-face time, preparing to see the patient, performing a medically appropriate examination, counseling and educating the patient, referring and communicating with other health care professionals, documenting clinical information in the electronic health record, and care coordination.   Johnston Police PA-C Dept of Hematology and Oncology [facility] at [facility] Phone: [phone]  Patient was seen with Dr. Federico.    10/30/2023 3:15 PM  I have read the above note and personally examined the patient. I agree with the assessment and plan as noted above.  Briefly Mr. Cinquemani presents today as a hospital follow-up.  He wanted to have more discussions regarding hospice and options moving forward.  He also wanted to talk about possible treatments for his brain metastasis.  At this time unfortunately do not believe those interventions will make any substantial difference in his prognosis.  Additionally he is not currently having any  symptoms such as lightheadedness, dizziness, headaches, vision changes, or confusion.  He voices understanding of our findings and recommendation moving forward.  We will continue to support him as best we can.   Norleen IVAR Federico, MD Department of Hematology/Oncology [facility] at [facility] Phone: [phone] Pager: [phone] Email: [email or web address]

## 2023-10-26 ENCOUNTER — Encounter (HOSPITAL_COMMUNITY): Payer: Self-pay

## 2023-10-26 ENCOUNTER — Emergency Department (HOSPITAL_COMMUNITY)

## 2023-10-26 ENCOUNTER — Other Ambulatory Visit: Payer: Self-pay

## 2023-10-26 ENCOUNTER — Inpatient Hospital Stay (HOSPITAL_COMMUNITY)
Admission: EM | Admit: 2023-10-26 | Discharge: 2023-11-03 | DRG: 492 | Disposition: A | Discharge status: Skilled Nursing Facility | Attending: Internal Medicine | Admitting: Internal Medicine

## 2023-10-26 DIAGNOSIS — D6481 Anemia due to antineoplastic chemotherapy: Secondary | ICD-10-CM | POA: Diagnosis present

## 2023-10-26 DIAGNOSIS — Z515 Encounter for palliative care: Secondary | ICD-10-CM

## 2023-10-26 DIAGNOSIS — S42309A Unspecified fracture of shaft of humerus, unspecified arm, initial encounter for closed fracture: Secondary | ICD-10-CM | POA: Diagnosis present

## 2023-10-26 DIAGNOSIS — S42402A Unspecified fracture of lower end of left humerus, initial encounter for closed fracture: Secondary | ICD-10-CM

## 2023-10-26 DIAGNOSIS — Z9981 Dependence on supplemental oxygen: Secondary | ICD-10-CM | POA: Diagnosis not present

## 2023-10-26 DIAGNOSIS — C7951 Secondary malignant neoplasm of bone: Secondary | ICD-10-CM | POA: Diagnosis present

## 2023-10-26 DIAGNOSIS — Z6820 Body mass index (BMI) 20.0-20.9, adult: Secondary | ICD-10-CM | POA: Diagnosis not present

## 2023-10-26 DIAGNOSIS — Z602 Problems related to living alone: Secondary | ICD-10-CM | POA: Diagnosis present

## 2023-10-26 DIAGNOSIS — M5442 Lumbago with sciatica, left side: Secondary | ICD-10-CM | POA: Diagnosis present

## 2023-10-26 DIAGNOSIS — J449 Chronic obstructive pulmonary disease, unspecified: Secondary | ICD-10-CM | POA: Diagnosis not present

## 2023-10-26 DIAGNOSIS — Z885 Allergy status to narcotic agent status: Secondary | ICD-10-CM | POA: Diagnosis not present

## 2023-10-26 DIAGNOSIS — Z87891 Personal history of nicotine dependence: Secondary | ICD-10-CM

## 2023-10-26 DIAGNOSIS — I10 Essential (primary) hypertension: Secondary | ICD-10-CM

## 2023-10-26 DIAGNOSIS — I679 Cerebrovascular disease, unspecified: Secondary | ICD-10-CM | POA: Diagnosis present

## 2023-10-26 DIAGNOSIS — C7931 Secondary malignant neoplasm of brain: Secondary | ICD-10-CM | POA: Diagnosis present

## 2023-10-26 DIAGNOSIS — Z95828 Presence of other vascular implants and grafts: Secondary | ICD-10-CM | POA: Diagnosis not present

## 2023-10-26 DIAGNOSIS — E871 Hypo-osmolality and hyponatremia: Secondary | ICD-10-CM

## 2023-10-26 DIAGNOSIS — S42402G Unspecified fracture of lower end of left humerus, subsequent encounter for fracture with delayed healing: Secondary | ICD-10-CM | POA: Diagnosis not present

## 2023-10-26 DIAGNOSIS — C7982 Secondary malignant neoplasm of genital organs: Secondary | ICD-10-CM | POA: Diagnosis present

## 2023-10-26 DIAGNOSIS — J439 Emphysema, unspecified: Secondary | ICD-10-CM | POA: Diagnosis present

## 2023-10-26 DIAGNOSIS — Z751 Person awaiting admission to adequate facility elsewhere: Secondary | ICD-10-CM

## 2023-10-26 DIAGNOSIS — Z79899 Other long term (current) drug therapy: Secondary | ICD-10-CM | POA: Diagnosis not present

## 2023-10-26 DIAGNOSIS — J9610 Chronic respiratory failure, unspecified whether with hypoxia or hypercapnia: Secondary | ICD-10-CM | POA: Diagnosis present

## 2023-10-26 DIAGNOSIS — M84522A Pathological fracture in neoplastic disease, left humerus, initial encounter for fracture: Principal | ICD-10-CM | POA: Diagnosis present

## 2023-10-26 DIAGNOSIS — C3490 Malignant neoplasm of unspecified part of unspecified bronchus or lung: Secondary | ICD-10-CM

## 2023-10-26 DIAGNOSIS — Z923 Personal history of irradiation: Secondary | ICD-10-CM

## 2023-10-26 DIAGNOSIS — Z66 Do not resuscitate: Secondary | ICD-10-CM | POA: Diagnosis present

## 2023-10-26 DIAGNOSIS — Z7982 Long term (current) use of aspirin: Secondary | ICD-10-CM | POA: Diagnosis not present

## 2023-10-26 DIAGNOSIS — W010XXA Fall on same level from slipping, tripping and stumbling without subsequent striking against object, initial encounter: Secondary | ICD-10-CM | POA: Diagnosis present

## 2023-10-26 DIAGNOSIS — Y92009 Unspecified place in unspecified non-institutional (private) residence as the place of occurrence of the external cause: Secondary | ICD-10-CM

## 2023-10-26 DIAGNOSIS — T451X5A Adverse effect of antineoplastic and immunosuppressive drugs, initial encounter: Secondary | ICD-10-CM | POA: Diagnosis present

## 2023-10-26 DIAGNOSIS — E43 Unspecified severe protein-calorie malnutrition: Secondary | ICD-10-CM | POA: Diagnosis present

## 2023-10-26 DIAGNOSIS — M84422A Pathological fracture, left humerus, initial encounter for fracture: Secondary | ICD-10-CM | POA: Diagnosis not present

## 2023-10-26 DIAGNOSIS — S42492A Other displaced fracture of lower end of left humerus, initial encounter for closed fracture: Principal | ICD-10-CM

## 2023-10-26 DIAGNOSIS — G893 Neoplasm related pain (acute) (chronic): Secondary | ICD-10-CM | POA: Diagnosis present

## 2023-10-26 DIAGNOSIS — D509 Iron deficiency anemia, unspecified: Secondary | ICD-10-CM | POA: Diagnosis present

## 2023-10-26 DIAGNOSIS — M47816 Spondylosis without myelopathy or radiculopathy, lumbar region: Secondary | ICD-10-CM | POA: Diagnosis present

## 2023-10-26 HISTORY — DX: Neoplasm of unspecified behavior of brain: D49.6

## 2023-10-26 LAB — CBC WITH DIFFERENTIAL/PLATELET
Abs Immature Granulocytes: 0.14 K/uL — ABNORMAL HIGH (ref 0.00–0.07)
Basophils Absolute: 0 K/uL (ref 0.0–0.1)
Basophils Relative: 0 %
Eosinophils Absolute: 0.1 K/uL (ref 0.0–0.5)
Eosinophils Relative: 1 %
HCT: 37.3 % — ABNORMAL LOW (ref 39.0–52.0)
Hemoglobin: 11.6 g/dL — ABNORMAL LOW (ref 13.0–17.0)
Immature Granulocytes: 1 %
Lymphocytes Relative: 7 %
Lymphs Abs: 1.2 K/uL (ref 0.7–4.0)
MCH: 26.5 pg (ref 26.0–34.0)
MCHC: 31.1 g/dL (ref 30.0–36.0)
MCV: 85.4 fL (ref 80.0–100.0)
Monocytes Absolute: 1.3 K/uL — ABNORMAL HIGH (ref 0.1–1.0)
Monocytes Relative: 7 %
Neutro Abs: 14.6 K/uL — ABNORMAL HIGH (ref 1.7–7.7)
Neutrophils Relative %: 84 %
Platelets: 391 K/uL (ref 150–400)
RBC: 4.37 MIL/uL (ref 4.22–5.81)
RDW: 20.5 % — ABNORMAL HIGH (ref 11.5–15.5)
WBC: 17.4 K/uL — ABNORMAL HIGH (ref 4.0–10.5)
nRBC: 0 % (ref 0.0–0.2)

## 2023-10-26 LAB — BASIC METABOLIC PANEL WITH GFR
Anion gap: 11 (ref 5–15)
BUN: 13 mg/dL (ref 8–23)
CO2: 22 mmol/L (ref 22–32)
Calcium: 9.6 mg/dL (ref 8.9–10.3)
Chloride: 97 mmol/L — ABNORMAL LOW (ref 98–111)
Creatinine, Ser: 0.96 mg/dL (ref 0.61–1.24)
GFR, Estimated: >60 mL/min (ref >60)
Glucose, Bld: 102 mg/dL — ABNORMAL HIGH (ref 70–99)
Potassium: 4.7 mmol/L (ref 3.5–5.1)
Sodium: 130 mmol/L — ABNORMAL LOW (ref 135–145)

## 2023-10-26 MED ORDER — FENTANYL CITRATE (PF) 50 MCG/ML IJ SOSY
50.0000 ug | PREFILLED_SYRINGE | INTRAMUSCULAR | Status: DC | PRN
Start: 2023-10-26 — End: 2023-10-27
  Administered 2023-10-26: 50 ug via INTRAVENOUS
  Filled 2023-10-26: qty 1

## 2023-10-26 MED ORDER — FENTANYL CITRATE (PF) 50 MCG/ML IJ SOSY
50.0000 ug | PREFILLED_SYRINGE | Freq: Once | INTRAMUSCULAR | Status: AC | PRN
  Administered 2023-10-26: 50 ug via INTRAVENOUS
  Filled 2023-10-26: qty 1

## 2023-10-26 MED ORDER — FENTANYL CITRATE (PF) 50 MCG/ML IJ SOSY
50.0000 ug | PREFILLED_SYRINGE | Freq: Once | INTRAMUSCULAR | Status: AC
  Administered 2023-10-26: 50 ug via INTRAVENOUS
  Filled 2023-10-26: qty 1

## 2023-10-26 NOTE — Progress Notes (Signed)
 [facility]  [facility] Liaison Note   Patient is currently active with [facility] hospice with the diagnosis of lung cancer.  Patient is DNR.  PCG is spouse, Malakye Nolden [phone].  Please reach out with any hospice related needs.  Thank you, Daphne Shed, LPN [facility] Liaison [phone]

## 2023-10-26 NOTE — Progress Notes (Signed)
 Orthopedic Tech Progress Note Patient Details:  [name] May 11, 1955 [id number]  Patient ID: [name]., male   DOB: 1955-06-29, 68 y.o.   MRN: [id number] Patient requested more meds for high pain level. Waiting on nurse to administer. [name] 10/26/2023, 8:51 PM

## 2023-10-26 NOTE — ED Provider Notes (Signed)
 [place] EMERGENCY DEPARTMENT AT [facility] Provider Note   CSN: [id number] Arrival date & time: 10/26/23  1656     Patient presents with: Fall and Arm Injury   Terry Page. is a 68 y.o. male.   HPI 68 year old male presents with a trip and fall.  EMS and patient provide the history.  Patient is currently on hospice and lives at home alone.  He tripped over the rug and tried to catch himself with his left arm as he is left-handed.  He did hit his head first on the right side of his head.  He denies any headache or neck pain.  He was put in a c-collar by EMS.  He injured his left arm and has a left upper arm deformity according to EMS.  No weakness or numbness in the hand.  No other injuries.  He is not on blood thinners.  Prior to Admission medications   Medication Sig Start Date End Date Taking? Authorizing Provider  acetaminophen  (TYLENOL ) 325 MG tablet Take 2 tablets (650 mg total) by mouth every 6 (six) hours as needed for mild pain or headache (fever >/= 101). 05/10/20   Arrien, Elidia Sieving, MD  albuterol  (VENTOLIN  HFA) 108 [phone] Base) MCG/ACT inhaler Inhale 2 puffs into the lungs every 6 (six) hours as needed for wheezing or shortness of breath. 04/23/23   Perri DELENA Meliton Mickey., MD  folic acid  (FOLVITE ) 1 MG tablet Take 1 tablet (1 mg total) by mouth daily. 03/09/23   Thayil, Irene T, PA-C  hydrOXYzine  (ATARAX ) 25 MG tablet Take 1 tablet (25 mg total) by mouth at bedtime as needed for itching. 08/27/23   Pickenpack-Cousar, Fannie SAILOR, NP  ipratropium-albuterol  (DUONEB) 0.5-2.5 (3) MG/3ML SOLN Take 3 mLs by nebulization every 6 (six) hours as needed. 05/10/20   Arrien, Elidia Sieving, MD  lidocaine  (LIDODERM ) 5 % Place 1 patch onto the skin daily. Remove & Discard patch within 12 hours or as directed by MD. Apply to low back Patient taking differently: Place 1 patch onto the skin daily as needed (pain). 04/25/23   Pickenpack-Cousar, Athena N, NP  lidocaine -prilocaine   (EMLA ) cream Apply 1 Application topically as needed. 04/25/23   Pickenpack-Cousar, Athena N, NP  mirtazapine  (REMERON ) 7.5 MG tablet Take 1 tablet (7.5 mg total) by mouth at bedtime. 07/05/23   Pickenpack-Cousar, Fannie SAILOR, NP  polyethylene glycol powder (MIRALAX ) 17 GM/SCOOP powder Dissolve 1 capful (17g) in 4-8 ounces of liquid and take by mouth daily. 09/17/23   Raenelle Coria, MD  senna-docusate (STIMULANT LAXATIVE) 8.6-50 MG tablet Take 2 tablets by mouth 2 (two) times daily. May add additional tablet as needed 08/27/23   Pickenpack-Cousar, Athena N, NP  traMADol  (ULTRAM ) 50 MG tablet Take 1-2 tablets (50-100 mg total) by mouth every 6 (six) hours as needed. 10/23/23   Pickenpack-Cousar, Fannie SAILOR, NP    Allergies: Ms contin  [morphine ] and Roxicodone  [oxycodone ]    Review of Systems  Musculoskeletal:  Positive for arthralgias. Negative for neck pain.  Neurological:  Negative for weakness, numbness and headaches.    Updated Vital Signs BP (!) 155/101 (BP Location: Right Arm)   Pulse 82   Temp 97.7 F (36.5 C) (Oral)   Resp 16   Ht 5' 9 (1.753 m)   Wt 63.5 kg   SpO2 96%   BMI 20.67 kg/m   Physical Exam Vitals and nursing note reviewed.  Constitutional:      Appearance: He is well-developed.  HENT:  Head: Normocephalic and atraumatic.  Cardiovascular:     Rate and Rhythm: Normal rate and regular rhythm.     Pulses:          Radial pulses are 2+ on the left side.     Heart sounds: Normal heart sounds.  Pulmonary:     Effort: Pulmonary effort is normal.     Breath sounds: Normal breath sounds.  Abdominal:     Palpations: Abdomen is soft.     Tenderness: There is no abdominal tenderness.  Musculoskeletal:     Left upper arm: Swelling, deformity and tenderness present.     Left forearm: No swelling or tenderness.     Left wrist: No tenderness. Normal range of motion.     Comments: Closed injury to the distal left upper arm. Grossly normal sensation to the left  hand. Grossly intact radial, ulnar, median nerve testing and general strength in the left hand.  Skin:    General: Skin is warm and dry.  Neurological:     Mental Status: He is alert.     (all labs ordered are listed, but only abnormal results are displayed) Labs Reviewed  BASIC METABOLIC PANEL WITH GFR - Abnormal; Notable for the following components:      Result Value   Sodium 130 (*)    Chloride 97 (*)    Glucose, Bld 102 (*)    All other components within normal limits  CBC WITH DIFFERENTIAL/PLATELET - Abnormal; Notable for the following components:   WBC 17.4 (*)    Hemoglobin 11.6 (*)    HCT 37.3 (*)    RDW 20.5 (*)    Neutro Abs 14.6 (*)    Monocytes Absolute 1.3 (*)    Abs Immature Granulocytes 0.14 (*)    All other components within normal limits    EKG: None  Radiology: DG Humerus Left Result Date: 10/26/2023 CLINICAL DATA:  Status post fall with left arm pain. Patient with history of metastatic lung cancer. EXAM: LEFT HUMERUS - 2+ VIEW COMPARISON:  None Available. FINDINGS: Angulated distal humeral shaft fracture, likely pathologic with suspected underlying lucent lesion. Mild apex lateral angulation. There is no intra-articular involvement. Elbow and shoulder alignment are maintained. Mild soft tissue edema at the fracture site. IMPRESSION: Angulated distal humeral shaft fracture, likely pathologic with suspected underlying lucent lesion. Electronically Signed   By: Andrea Gasman M.D.   On: 10/26/2023 18:12   CT Cervical Spine Wo Contrast Result Date: 10/26/2023 CLINICAL DATA:  Neck trauma, unwitnessed fall. EXAM: CT CERVICAL SPINE WITHOUT CONTRAST TECHNIQUE: Multidetector CT imaging of the cervical spine was performed without intravenous contrast. Multiplanar CT image reconstructions were also generated. RADIATION DOSE REDUCTION: This exam was performed according to the departmental dose-optimization program which includes automated exposure control, adjustment  of the mA and/or kV according to patient size and/or use of iterative reconstruction technique. COMPARISON:  Brain MRI 09/15/2023 reviewed FINDINGS: Alignment: Normal. Skull base and vertebrae: No acute fracture. Vertebral body heights are maintained. The dens and skull base are intact. Vague area of sclerosis in the right aspect of C1 corresponding to suspected lesion on prior MRI no extraosseous mass effect. Incidental non fusion posterior arch of C1. Soft tissues and spinal canal: No prevertebral fluid or swelling. No visible canal hematoma. Disc levels: Multilevel degenerative disc disease and facet hypertrophy. Upper chest: Advanced emphysema. Circumferential pleural thickening at the right lung apex. Radiopaque cul pulmonary nodules. Patient with known metastatic lung cancer. Other: Carotid and vertebral artery calcifications. IMPRESSION:  1. No acute fracture or subluxation of the cervical spine. 2. Vague area of sclerosis in the right aspect of C1 corresponding to suspected lesion on prior MRI. No extraosseous mass effect. 3. Multilevel degenerative disc disease and facet hypertrophy. Emphysema (ICD10-J43.9). Electronically Signed   By: Andrea Gasman M.D.   On: 10/26/2023 18:06   CT Head Wo Contrast Result Date: 10/26/2023 CLINICAL DATA:  Head trauma, minor (Age >= 65y) Unwitnessed fall. EXAM: CT HEAD WITHOUT CONTRAST TECHNIQUE: Contiguous axial images were obtained from the base of the skull through the vertex without intravenous contrast. RADIATION DOSE REDUCTION: This exam was performed according to the departmental dose-optimization program which includes automated exposure control, adjustment of the mA and/or kV according to patient size and/or use of iterative reconstruction technique. COMPARISON:  Head CT and brain MRI 09/15/2023 FINDINGS: Brain: Known intracranial metastasis. There is edema within the left cerebellum, similar to prior exam corresponding to largest metastatic lesion. There is  similar effacement of the fourth ventricle from prior exam. There is edema within the left occipital lobe at site of metastatic lesion on MRI. Enhancing lesion abutting the falx is faintly visualized in the left parietal lobe, series 3, image 21. Small focus of hyperdensity in the right parietal lobe corresponds to a known metastatic lesion. Many of the metastatic lesions are not well demonstrated by CT. No evidence of hemorrhage or subdural collection. Stable brain volume and periventricular chronic small vessel ischemia. Chronic lacunar infarct in left corona radiata. Vascular: Atherosclerosis of skullbase vasculature without hyperdense vessel or abnormal calcification. Skull: No skull fracture. The left parietal calvarial lesion on MRI is not well demonstrated by CT. Sinuses/Orbits: Scattered mucosal thickening. No acute finding. No mastoid effusion. Other: No confluent scalp contusion. IMPRESSION: 1. No acute intracranial abnormality. No skull fracture. 2. Known intracranial metastasis. There is edema within the left cerebellum and left occipital lobe corresponding to known metastatic lesions, similar to CT last month. There is similar effacement of the fourth ventricle from prior exam. Electronically Signed   By: Andrea Gasman M.D.   On: 10/26/2023 17:59     Procedures   Medications Ordered in the ED  fentaNYL  (SUBLIMAZE ) injection 50 mcg (50 mcg Intravenous Given 10/26/23 2253)  fentaNYL  (SUBLIMAZE ) injection 50 mcg (50 mcg Intravenous Given 10/26/23 1933)  fentaNYL  (SUBLIMAZE ) injection 50 mcg (50 mcg Intravenous Given 10/26/23 2049)                                    Medical Decision Making Amount and/or Complexity of Data Reviewed Labs: ordered.    Details: Leukocytosis Radiology: ordered.    Details: Distal humerus fracture  Risk Prescription drug management. Decision regarding hospitalization.   Patient presents with a fall.  Appears to have a pathologic fracture to his  distal humerus.  No neurovascular compromise.  I discussed case with Dr. Kendal, does not need to be emergently operated on, likely also will happen this weekend.  Needs a long-arm splint and if needs pain control then he will consult in the hospital.  Patient still needs further IV pain control and lives at home by himself and does not feel comfortable going home.  Will need a hospitalist admission.     Final diagnoses:  Other closed displaced fracture of distal end of left humerus, initial encounter    ED Discharge Orders     None  Freddi Hamilton, MD 10/26/23 2256

## 2023-10-26 NOTE — ED Triage Notes (Signed)
 Patient bib [facility] from home. He had a unwitnessed fall, he states he did hit his head, denies neck and head pain. Denies LOC, does not take thinners. He has complaints of lower back pain, and left arm deformity.  Deformity is upper left arm, above the elbow. EMS splinted arm and placed a c-collar on prior to arrival. Pt is on 4L o2 all the time.  PMH: lung cancer, hospice patient .

## 2023-10-26 NOTE — Progress Notes (Addendum)
 Orthopedic Tech Progress Note Patient Details:  Terry Page. 19-Nov-1955 [id number] Patient did not tolerate the application of the splint. I did my best to carefully adjust and apply the splint. There was still a deformity towards the arm but no reduction was performed by MD's due to unknown reasons. Sling for support. Ortho Devices Type of Ortho Device: Arm sling, Post (long arm) splint, Ace wrap, Cotton web roll Ortho Device/Splint Location: L HUMERUS Ortho Device/Splint Interventions: Application, Adjustment, Ordered   Post Interventions Patient Tolerated: Poor Instructions Provided: Adjustment of device  [name] [name] 10/26/2023, 10:22 PM

## 2023-10-26 NOTE — ED Notes (Signed)
 Unsuccessful blood draw. Blood bank band from 10/23/23 taken off pt's wrist.  KM

## 2023-10-26 NOTE — Hospital Course (Addendum)
 68 y.o. 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    Was at home, where he lives alone.  Accidentally tripped, landed on his left side and had deformity of the arm.  In the ER, x-ray showed humerus fracture.  Given analgesics and hospitalist service consulted for PT eval, pain control.

## 2023-10-27 DIAGNOSIS — S42402G Unspecified fracture of lower end of left humerus, subsequent encounter for fracture with delayed healing: Secondary | ICD-10-CM | POA: Diagnosis not present

## 2023-10-27 MED ORDER — HYDROMORPHONE HCL 2 MG PO TABS: 1.0000 mg | ORAL_TABLET | ORAL | Status: DC | PRN

## 2023-10-27 MED ORDER — POLYETHYLENE GLYCOL 3350 17 G PO PACK
17.0000 g | PACK | Freq: Every day | ORAL | Status: DC
  Administered 2023-10-28 – 2023-10-31 (×3): 17 g via ORAL
  Filled 2023-10-27 (×3): qty 1

## 2023-10-27 MED ORDER — HYDROMORPHONE HCL 1 MG/ML IJ SOLN
0.5000 mg | INTRAMUSCULAR | Status: DC | PRN
  Administered 2023-10-27: 0.5 mg via INTRAVENOUS
  Filled 2023-10-27: qty 1

## 2023-10-27 MED ORDER — MIRTAZAPINE 15 MG PO TABS
7.5000 mg | ORAL_TABLET | Freq: Every day | ORAL | Status: DC
  Administered 2023-10-27 – 2023-11-02 (×7): 7.5 mg via ORAL
  Filled 2023-10-27 (×7): qty 1

## 2023-10-27 MED ORDER — ALBUTEROL SULFATE (2.5 MG/3ML) 0.083% IN NEBU: 3.0000 mL | INHALATION_SOLUTION | Freq: Four times a day (QID) | RESPIRATORY_TRACT | Status: DC | PRN

## 2023-10-27 MED ORDER — ONDANSETRON HCL 4 MG PO TABS: 4.0000 mg | ORAL_TABLET | Freq: Four times a day (QID) | ORAL | Status: DC | PRN

## 2023-10-27 MED ORDER — ALBUTEROL SULFATE HFA 108 (90 BASE) MCG/ACT IN AERS: 2.0000 | INHALATION_SPRAY | Freq: Four times a day (QID) | RESPIRATORY_TRACT | Status: DC | PRN

## 2023-10-27 MED ORDER — ENOXAPARIN SODIUM 40 MG/0.4ML IJ SOSY
40.0000 mg | PREFILLED_SYRINGE | INTRAMUSCULAR | Status: DC
  Administered 2023-10-28 – 2023-11-03 (×6): 40 mg via SUBCUTANEOUS
  Filled 2023-10-27 (×6): qty 0.4

## 2023-10-27 MED ORDER — HYDROMORPHONE HCL 1 MG/ML IJ SOLN
0.5000 mg | INTRAMUSCULAR | Status: DC | PRN
  Administered 2023-10-27: 1 mg via INTRAVENOUS
  Filled 2023-10-27: qty 1

## 2023-10-27 MED ORDER — SENNOSIDES-DOCUSATE SODIUM 8.6-50 MG PO TABS
2.0000 | ORAL_TABLET | Freq: Two times a day (BID) | ORAL | Status: DC
  Administered 2023-10-27 – 2023-11-03 (×11): 2 via ORAL
  Filled 2023-10-27 (×12): qty 2

## 2023-10-27 MED ORDER — ENSURE PLUS HIGH PROTEIN PO LIQD
237.0000 mL | Freq: Two times a day (BID) | ORAL | Status: DC
  Administered 2023-10-27 – 2023-10-30 (×3): 237 mL via ORAL
  Filled 2023-10-27: qty 237

## 2023-10-27 MED ORDER — TRAMADOL HCL 50 MG PO TABS
50.0000 mg | ORAL_TABLET | Freq: Four times a day (QID) | ORAL | Status: DC | PRN
  Administered 2023-10-29: 100 mg via ORAL
  Filled 2023-10-27: qty 2

## 2023-10-27 MED ORDER — ACETAMINOPHEN 500 MG PO TABS
1000.0000 mg | ORAL_TABLET | Freq: Three times a day (TID) | ORAL | Status: DC
Start: 2023-10-27 — End: 2023-11-03
  Administered 2023-10-27 – 2023-11-03 (×19): 1000 mg via ORAL
  Filled 2023-10-27 (×18): qty 2

## 2023-10-27 MED ORDER — LIDOCAINE 5 % EX PTCH
1.0000 | MEDICATED_PATCH | Freq: Every day | CUTANEOUS | Status: DC | PRN
  Administered 2023-10-30: 1 via TRANSDERMAL
  Filled 2023-10-27: qty 1

## 2023-10-27 MED ORDER — ONDANSETRON HCL 4 MG/2ML IJ SOLN: 4.0000 mg | Freq: Four times a day (QID) | INTRAMUSCULAR | Status: DC | PRN

## 2023-10-27 NOTE — Assessment & Plan Note (Signed)
 Mild, asymptomatic, at baseline

## 2023-10-27 NOTE — Progress Notes (Signed)
   Terry Page.  FMW:[id number] DOB: July 20, 1955 DOA: 10/26/2023 PCP: Rolinda Millman, MD    Brief Narrative:  68 year old with a history of lung cancer metastatic to bone and brain, chronic respiratory failure on 4 L home O2, and HTN who has been under Hospice care since September 2025 when he was discovered to have new brain mets who presented to the ER 10/24 after suffering a fall in which he was found to have fractured his left humerus.  The patient lives alone and therefore admission to the hospital was necessary for pain control.  Goals of Care:   Code Status: Do not attempt resuscitation (DNR) PRE-ARREST INTERVENTIONS DESIRED   DVT prophylaxis: enoxaparin  (LOVENOX ) injection 40 mg Start: 10/27/23 1000   Interim Hx: Resting comfortably at the time of my visit. States pain is now well controlled. No sob.   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 rate 18, height 5' 9 (1.753 m), weight 63.5 kg, SpO2 95%. No intake or output data in the 24 hours ending 10/27/23 0834 Filed Weights   10/26/23 1705  Weight: 63.5 kg    Examination: General: No acute respiratory distress Lungs: Clear to auscultation bilaterally  Cardiovascular: Regular rate and rhythm without murmur  Abdomen: Nontender, nondistended, soft,  bowel sounds positive, no rebound Extremities: No significant edema bilateral lower extremities  CBC: Recent Labs  Lab 10/23/23 0942 10/26/23 2143  WBC 11.1* 17.4*  NEUTROABS 7.9* 14.6*  HGB 12.9* 11.6*  HCT 40.0 37.3*  MCV 82.6 85.4  PLT 382 391   Basic Metabolic Panel: Recent Labs  Lab 10/23/23 0942 10/26/23 2143  NA 133* 130*  K 4.0 4.7  CL 96* 97*  CO2 28 22  GLUCOSE 98 102*  BUN 19 13  CREATININE 1.27* 0.96  CALCIUM  10.7* 9.6   GFR: Estimated Creatinine Clearance: 64.3 mL/min (by C-G formula based on SCr of 0.96 mg/dL).   Scheduled Meds:  acetaminophen   1,000 mg Oral TID   enoxaparin  (LOVENOX ) injection  40 mg Subcutaneous Q24H   feeding supplement  237 mL Oral BID BM   mirtazapine   7.5 mg Oral QHS   polyethylene glycol  17 g Oral Daily   senna-docusate  2 tablet Oral BID     LOS: 1 day   Reyes IVAR Moores, MD [facility] Office  [phone] Pager - Text Page per Tracey  If 7PM-7AM, please contact night-coverage per Amion 10/27/2023, 8:34 AM

## 2023-10-27 NOTE — ED Notes (Signed)
 IV pain medications administered at this time by verbal ok of surgery PA, reports that pt is scheduled for procedure this AM and will be sent for shortly.

## 2023-10-27 NOTE — Assessment & Plan Note (Signed)
-   Consult Ortho, appreciate expertise - Schedule acetaminophen  - Tramadol  100 mg q6hrs - Paitent does not tolerate oxycodone  or fentanyl  well he reports - PRN Dilaudid  PO or IV - Continue home gabapentin and Robaxin

## 2023-10-27 NOTE — Consult Note (Signed)
 Orthopaedic Trauma Service (OTS) Consult   Patient ID: Terry Page. MRN: [id number] DOB/AGE: Aug 10, 1955 68 y.o.  Reason for Consult: Left distal humerus fracture Referring Provider: Dr. Elige, MD [facility] ED)  HPI: Terry Nicol. is a 68 y.o. RHD male on hospice with history of lung CA metastatic to bone and brain, chronic respiratory failure on 4L O2 at baseline, hypertension being seen in consultation at the request of Dr. Freddi for left distal humerus fracture.  Patient sustained a fall while at home yesterday.  Had immediate pain and deformity to the left arm.  Presented to the emergency department for further evaluation where x-rays showed distal humeral shaft fracture.  Orthopedics was consulted for evaluation and management.  Initial recommendation was for long-arm splint and outpatient follow-up.  Because patient lives alone, he did not feel comfortable discharging and hospital service subsequently consulted for admission for pain control and therapies.  Patient seen this morning in the emergency department.  Notes pain in the left arm, decently well-controlled with current medications.  Denies any other injuries from his fall denies any numbness or tingling throughout the left upper extremity.  No previous injury or surgery to the left arm.  Tolerating his splint fairly well.  No family at bedside currently.  Past Medical History:  Diagnosis Date   Alcohol abuse    Asthma    Bullous emphysema ([name])    Essential hypertension 04/10/2020   Hemorrhoids    History of radiation therapy 04/08/20-04/19/20   IMRT- Left lung- Dr. Lynwood Nasuti   Incidental lung nodule, greater than or equal to 8mm 10/22/2017   Left upper lobe - discovered on CTA   Prostate cancer ([name])    Spontaneous pneumothorax 10/20/2017   right   Tobacco abuse    Past Surgical History:  Procedure Laterality Date   BACK SURGERY     IR IMAGING GUIDED PORT INSERTION  06/29/2021   PROSTATE BIOPSY      Family History  Problem Relation Age of Onset   Cancer Cousin        maternal cousin   Cancer Cousin        paternal cousin   Cancer Cousin    Colon polyps Neg Hx    Pancreatic disease Neg Hx    Pancreatic cancer Neg Hx    Breast cancer Neg Hx    Colon cancer Neg Hx     Social History:  reports that he quit smoking about 3 years ago. His smoking use included cigarettes. He has never used smokeless tobacco. He reports current alcohol use of about 12.0 standard drinks of alcohol per week. He reports that he does not use drugs.  Allergies:  Allergies  Allergen Reactions   Ms Contin  [Morphine ] Itching   Roxicodone  [Oxycodone ] Itching    Medications: Prior to Admission medications   Medication Sig Start Date End Date Taking? Authorizing Provider  folic acid  (FOLVITE ) 1 MG tablet Take 1 tablet (1 mg total) by mouth daily. 03/09/23  Yes Thayil, Irene T, PA-C  hydrOXYzine  (ATARAX ) 25 MG tablet Take 1 tablet (25 mg total) by mouth at bedtime as needed for itching. 08/27/23  Yes Pickenpack-Cousar, Fannie SAILOR, NP  lidocaine  (LIDODERM ) 5 % Place 1 patch onto the skin daily. Remove & Discard patch within 12 hours or as directed by MD. Apply to low back Patient taking differently: Place 1 patch onto the skin daily as needed (pain). 04/25/23  Yes Pickenpack-Cousar, Athena N, NP  lidocaine -prilocaine  (EMLA ) cream  Apply 1 Application topically as needed. 04/25/23  Yes Pickenpack-Cousar, Fannie SAILOR, NP  MEDIQUE ASPIRIN  325 MG tablet Take 650 mg by mouth every morning. 10/24/23  Yes [provider]  mirtazapine  (REMERON ) 7.5 MG tablet Take 1 tablet (7.5 mg total) by mouth at bedtime. 07/05/23  Yes Pickenpack-Cousar, Fannie SAILOR, NP  polyethylene glycol powder (MIRALAX ) 17 GM/SCOOP powder Dissolve 1 capful (17g) in 4-8 ounces of liquid and take by mouth daily. Patient taking differently: Take 17 g by mouth daily as needed for mild constipation. 09/17/23  Yes Ghimire, Renato, MD  senna-docusate (STIMULANT  LAXATIVE) 8.6-50 MG tablet Take 2 tablets by mouth 2 (two) times daily. May add additional tablet as needed 08/27/23  Yes Pickenpack-Cousar, Athena N, NP  traMADol  (ULTRAM ) 50 MG tablet Take 1-2 tablets (50-100 mg total) by mouth every 6 (six) hours as needed. 10/23/23  Yes Pickenpack-Cousar, Fannie SAILOR, NP  albuterol  (VENTOLIN  HFA) 108 (90 Base) MCG/ACT inhaler Inhale 2 puffs into the lungs every 6 (six) hours as needed for wheezing or shortness of breath. Patient not taking: Reported on 10/27/2023 04/23/23   Perri DELENA Meliton Page., MD   I have reviewed the patient's current medications. Prior to Admission: (Not in a hospital admission)   Positive ROS: All other systems have been reviewed and were otherwise negative with the exception of those mentioned in the HPI and as above.  Exam: Blood pressure (!) 140/96, pulse 96, temperature 97.7 F (36.5 C), temperature source Oral, resp. rate 18, height 5' 9 (1.753 m), weight 63.5 kg, SpO2 95%. General: Alert and oriented, no acute distress Cardiovascular: No pedal edema Respiratory: No cyanosis, no use of accessory musculature GI: No organomegaly, abdomen is soft and non-tender Skin: No lesions in the area of chief complaint Neurologic: Sensation intact distally Psychiatric: Patient is competent for consent with normal mood and affect  Musculoskeletal: Injured Extremity (left upper extremity): Sling and long arm splint in place.  Patient noted to have soreness above the splint and around the shoulder.  Did not attempt any shoulder range of motion.  Nontender over the hand/fingers.  Endorses sensation all aspects of the hand.  Able to wiggle all fingers.  Fingers are warm and well-perfused.    Right upper extremity: Skin without lesions. No tenderness to palpation. Full painless ROM, full strength in each muscle group without evidence of instability. Motor/sensory function at baseline. Neurovascularly intact.   Medical Decision  Making: Data: Imaging: Two views of the left humerus show angulated distal humeral shaft fracture with underlying lucent lesion noted concerning for pathologic fracture.  Labs:  Results for orders placed or performed during the hospital encounter of 10/26/23 (from the past 24 hours)  Basic metabolic panel     Status: Abnormal   Collection Time: 10/26/23  9:43 PM  Result Value Ref Range   Sodium 130 (L) 135 - 145 mmol/L   Potassium 4.7 3.5 - 5.1 mmol/L   Chloride 97 (L) 98 - 111 mmol/L   CO2 22 22 - 32 mmol/L   Glucose, Bld 102 (H) 70 - 99 mg/dL   BUN 13 8 - 23 mg/dL   Creatinine, Ser 9.03 0.61 - 1.24 mg/dL   Calcium  9.6 8.9 - 10.3 mg/dL   GFR, Estimated >39 >39 mL/min   Anion gap 11 5 - 15  CBC with Differential     Status: Abnormal   Collection Time: 10/26/23  9:43 PM  Result Value Ref Range   WBC 17.4 (H) 4.0 - 10.5 K/uL  RBC 4.37 4.22 - 5.81 MIL/uL   Hemoglobin 11.6 (L) 13.0 - 17.0 g/dL   HCT 62.6 (L) 60.9 - 47.9 %   MCV 85.4 80.0 - 100.0 fL   MCH 26.5 26.0 - 34.0 pg   MCHC 31.1 30.0 - 36.0 g/dL   RDW 79.4 (H) 88.4 - 84.4 %   Platelets 391 150 - 400 K/uL   nRBC 0.0 0.0 - 0.2 %   Neutrophils Relative % 84 %   Neutro Abs 14.6 (H) 1.7 - 7.7 K/uL   Lymphocytes Relative 7 %   Lymphs Abs 1.2 0.7 - 4.0 K/uL   Monocytes Relative 7 %   Monocytes Absolute 1.3 (H) 0.1 - 1.0 K/uL   Eosinophils Relative 1 %   Eosinophils Absolute 0.1 0.0 - 0.5 K/uL   Basophils Relative 0 %   Basophils Absolute 0.0 0.0 - 0.1 K/uL   Immature Granulocytes 1 %   Abs Immature Granulocytes 0.14 (H) 0.00 - 0.07 K/uL    Medical history and chart was reviewed and case discussed with attending provider.  Assessment/Plan: 68 year old male with history of lung cancer s/p ground-level fall at home, presenting with displaced left distal humerus fracture   Patient with significant injury to left lower extremity that is amenable to surgical intervention.  In order to optimize function and assist with pain,  I recommend proceeding with open reduction internal fixation of the left distal humerus. We will tentatively plan for  OR with Dr. Kendal on Monday, 10/29/2023 to address left distal humerus fracture.  Maintain long-arm splint and sling until then.  Continue care per hospitalist service.  Please keep patient n.p.o. midnight 10/29/2023.   Lauraine PATRIC Moores PA-C [facility] [phone] (office) [email or web address]

## 2023-10-27 NOTE — Assessment & Plan Note (Signed)
 Enrolled in Hospice since 9/15 discharge.  Lives at home alone.   - Will alert [facility] hospice in AM - Consult TOC

## 2023-10-27 NOTE — Assessment & Plan Note (Signed)
 As evidenced by 10% weight loss in last 6 months, as well as decreased muscle mass and fat in setting of cancer. - Consult dietitian

## 2023-10-27 NOTE — Assessment & Plan Note (Signed)
 No longer on antihypertensives given GOC

## 2023-10-27 NOTE — Plan of Care (Signed)
  Problem: Education: Goal: Knowledge of General Education information will improve Description: Including pain rating scale, medication(s)/side effects and non-pharmacologic comfort measures Outcome: Progressing   Problem: Activity: Goal: Risk for activity intolerance will decrease Outcome: Progressing   Problem: Pain Managment: Goal: General experience of comfort will improve and/or be controlled Outcome: Progressing

## 2023-10-27 NOTE — H&P (Addendum)
 History and Physical    Patient: Terry Page. FMW:[id number] DOB: January 02, 1956 DOA: 10/26/2023 DOS: the patient was seen and examined on 10/27/2023 PCP: Rolinda Millman, MD  Patient coming from: Home  Chief Complaint:  Chief Complaint  Patient presents with   Fall   Arm Injury       HPI:  68 y.o. 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    Was at home, where he lives alone.  Accidentally tripped, landed on his left side and had deformity of the arm.  In the ER, x-ray showed humerus fracture.  Given analgesics and hospitalist service consulted for PT eval, pain control.      Review of Systems  Musculoskeletal:  Positive for falls.  Neurological:  Negative for loss of consciousness.  All other systems reviewed and are negative.    Past Medical History:  Diagnosis Date   Alcohol abuse    Asthma    Bullous emphysema ([name])    Essential hypertension 04/10/2020   Hemorrhoids    History of radiation therapy 04/08/20-04/19/20   IMRT- Left lung- Dr. Lynwood Nasuti   Incidental lung nodule, greater than or equal to 8mm 10/22/2017   Left upper lobe - discovered on CTA   Prostate cancer [facility])    Spontaneous pneumothorax 10/20/2017   right   Tobacco abuse    Past Surgical History:  Procedure Laterality Date   BACK SURGERY     IR IMAGING GUIDED PORT INSERTION  06/29/2021   PROSTATE BIOPSY     Social History:  reports that he quit smoking about 3 years ago. His smoking use included cigarettes. He has never used smokeless tobacco. He reports current alcohol use of about 12.0 standard drinks of alcohol per week. He reports that he does not use drugs.  Allergies  Allergen Reactions   Ms Contin  [Morphine ] Itching   Roxicodone  [Oxycodone ] Itching    Family History  Problem Relation Age of Onset   Cancer Cousin         maternal cousin   Cancer Cousin        paternal cousin   Cancer Cousin    Colon polyps Neg Hx    Pancreatic disease Neg Hx    Pancreatic cancer Neg Hx    Breast cancer Neg Hx    Colon cancer Neg Hx     Prior to Admission medications   Medication Sig Start Date End Date Taking? Authorizing Provider  albuterol  (VENTOLIN  HFA) 108 (90 Base) MCG/ACT inhaler Inhale 2 puffs into the lungs every 6 (six) hours as needed for wheezing or shortness of breath. 04/23/23   Perri DELENA Meliton Mickey., MD  folic acid  (FOLVITE ) 1 MG tablet Take 1 tablet (1 mg total) by mouth daily. 03/09/23   Thayil, Irene T, PA-C  hydrOXYzine  (ATARAX ) 25 MG tablet Take 1 tablet (25 mg total) by mouth at bedtime as needed for itching. 08/27/23   Pickenpack-Cousar, Fannie SAILOR, NP  ipratropium-albuterol  (DUONEB) 0.5-2.5 (3) MG/3ML SOLN Take 3 mLs by nebulization every 6 (six) hours as needed. 05/10/20   Arrien, Elidia Sieving, MD  lidocaine  (LIDODERM ) 5 % Place 1 patch onto the skin daily. Remove & Discard patch within 12 hours or as directed by MD. Apply to low back Patient taking differently: Place 1 patch onto the skin daily as needed (pain).  04/25/23   Pickenpack-Cousar, Athena N, NP  lidocaine -prilocaine  (EMLA ) cream Apply 1 Application topically as needed. 04/25/23   Pickenpack-Cousar, Athena N, NP  mirtazapine  (REMERON ) 7.5 MG tablet Take 1 tablet (7.5 mg total) by mouth at bedtime. 07/05/23   Pickenpack-Cousar, Fannie SAILOR, NP  polyethylene glycol powder (MIRALAX ) 17 GM/SCOOP powder Dissolve 1 capful (17g) in 4-8 ounces of liquid and take by mouth daily. 09/17/23   Raenelle Coria, MD  senna-docusate (STIMULANT LAXATIVE) 8.6-50 MG tablet Take 2 tablets by mouth 2 (two) times daily. May add additional tablet as needed 08/27/23   Pickenpack-Cousar, Athena N, NP  traMADol  (ULTRAM ) 50 MG tablet Take 1-2 tablets (50-100 mg total) by mouth every 6 (six) hours as needed. 10/23/23   [place] Fannie SAILOR, NP    Physical Exam: Vitals:    10/26/23 2115 10/26/23 2130 10/26/23 2200 10/27/23 0106  BP: (!) 134/90 (!) 167/84 (!) 153/115 (!) 145/91  Pulse: 91 86 100 98  Resp:    16  Temp:    98 F (36.7 C)  TempSrc:    Oral  SpO2: 94% 97% 93% 96%  Weight:      Height:       Thin adult male, lying in bed, appears uncomfortable.  Responsive to questions. RRR, no murmurs, no peripheral edema Respiratory rate normal, lungs clear without rales or wheezes Abdomen soft, no tenderness palpation or guarding, no ascites or distention The left arm is in a splint Attention is normal, affect appropriate, judgment and insight appear normal, face symmetric, speech fluent, right arm strength seems normal.    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  Lives at home alone.   - Will alert [facility] hospice in AM - Consult TOC  Cerebrovascular disease    Protein-calorie malnutrition, severe As evidenced by 10% weight loss in last 6 months, as well as decreased muscle mass and fat in setting of cancer. - Continue mirtazapine  - Ensure  Essential hypertension No longer on antihypertensives given GOC  Hyponatremia Mild, asymptomatic, at baseline         Advance Care Planning: DNR, advance care documents reviewed  Consults: Orthopedics, Dr. Kendal, EDP reported Dr. Kendal will see in the morning,  anticipating tentative surgery Monday  Family Communication: None present  Severity of Illness: The appropriate patient status for this patient is INPATIENT. Inpatient status is judged to be reasonable and necessary in order to provide the required intensity of service to ensure the patient's safety. The patient's presenting symptoms, physical exam findings, and initial radiographic and laboratory data in the context of their chronic comorbidities is felt to place them at high risk for further clinical deterioration. Furthermore, it is not anticipated that the patient will be medically stable for discharge from the hospital within 2 midnights of admission.   * I certify that at the point of admission it is my clinical judgment that the patient will require inpatient  hospital care spanning beyond 2 midnights from the point of admission due to high intensity of service, high risk for further deterioration and high frequency of surveillance required.*  Author: Lonni SHAUNNA Dalton, MD 10/27/2023 1:28 AM  For on call review [email or web address].

## 2023-10-28 DIAGNOSIS — S42402G Unspecified fracture of lower end of left humerus, subsequent encounter for fracture with delayed healing: Secondary | ICD-10-CM | POA: Diagnosis not present

## 2023-10-28 MED ORDER — HYDROMORPHONE HCL 2 MG PO TABS
1.0000 mg | ORAL_TABLET | ORAL | Status: DC | PRN
  Administered 2023-10-29: 1 mg via ORAL
  Filled 2023-10-28: qty 1

## 2023-10-28 MED ORDER — HYDROMORPHONE HCL 1 MG/ML IJ SOLN: 0.5000 mg | INTRAMUSCULAR | Status: DC | PRN

## 2023-10-28 NOTE — Evaluation (Signed)
 Physical Therapy Evaluation Patient Details Name: Terry Page. MRN: [id number] DOB: 18-Oct-1955 Today's Date: 10/28/2023  History of Present Illness  68 y.o. 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  Clinical Impression  Pt admitted secondary to problem above with deficits below. PTA patient was living alone in a one-level apartment with no steps to enter. He primarily used a cane for ambulation, with occasional use of rollator. He reports his home Hospice services have not yet begun. Pt currently requires mod assist for bed mobility (with use of bed features to assist), mod assist for sit to stand, and min assist to step-pivot to chair. He remains very unsteady with wide BOS and shuffling steps. Attempted to discuss post-surgery/post-acute plans re: rehab vs Hospice and pt did not want to discuss this until after his surgery. Patient would benefit from post-acute inpatient therapies <3 hrs/day to maximize his safety and independence, however this would delay his Hospice services. Will continue to assess and discuss post-op.  Anticipate patient will benefit from PT to address problems listed below. Will continue to follow acutely to maximize functional mobility, independence, and safety.           If plan is discharge home, recommend the following: A lot of help with walking and/or transfers;A lot of help with bathing/dressing/bathroom;Assistance with cooking/housework;Assist for transportation   Can travel by private vehicle        Equipment Recommendations Other (comment) (?quad cane vs hemiwalker)  Recommendations for Other Services  OT consult    Functional Status Assessment Patient has had a recent decline in their functional status and demonstrates the ability to make significant improvements in function in a  reasonable and predictable amount of time.     Precautions / Restrictions Precautions Precautions: Fall;Other (comment) Recall of Precautions/Restrictions: Intact Precaution/Restrictions Comments: LUE splint, sling Restrictions LUE Weight Bearing Per Provider Order: Non weight bearing      Mobility  Bed Mobility Overal bed mobility: Needs Assistance Bed Mobility: Supine to Sit     Supine to sit: Mod assist, HOB elevated     General bed mobility comments: pt required assist to complete moving legs over side, HOB elevated and used his RUE to pull to sit EOB; required mod assist to scoot out to EOB    Transfers Overall transfer level: Needs assistance Equipment used: 1 person hand held assist Transfers: Sit to/from Stand, Bed to chair/wheelchair/BSC Sit to Stand: Mod assist   Step pivot transfers: Min assist       General transfer comment: uses wide base of support; forward flexed posture; small, shuffling steps    Ambulation/Gait               General Gait Details: deferred due to no portable oxygen or O2 extension tubing on unit.  Stairs            Wheelchair Mobility     Tilt Bed    Modified Rankin (Stroke Patients Only)       Balance Overall balance assessment: Needs assistance Sitting-balance support: No upper extremity supported, Feet supported Sitting balance-Leahy Scale: Fair     Standing balance support: Single extremity supported, During functional activity Standing balance-Leahy Scale: Poor  Pertinent Vitals/Pain Pain Assessment Pain Assessment: Faces Faces Pain Scale: Hurts even more Pain Location: LUE Pain Descriptors / Indicators: Moaning, Grimacing, Guarding, Sharp Pain Intervention(s): Limited activity within patient's tolerance, Monitored during session, Premedicated before session, Repositioned    Home Living Family/patient expects to be discharged to:: Private residence Living  Arrangements: Alone Available Help at Discharge: Family;Available PRN/intermittently (dtr and wife live nearby) Type of Home: Apartment Home Access: Level entry       Home Layout: One level Home Equipment: Cane - single point;Tub bench;Rollator (4 wheels) Additional Comments: does not drive; states home Hospice services have not started    Prior Function Prior Level of Function : Independent/Modified Independent;History of Falls (last six months)             Mobility Comments: Ambulates with cane mostly; occasionally rollator       Extremity/Trunk Assessment   Upper Extremity Assessment Upper Extremity Assessment: LUE deficits/detail;Right hand dominant LUE Deficits / Details: in long arm splint and sling    Lower Extremity Assessment Lower Extremity Assessment: Generalized weakness    Cervical / Trunk Assessment Cervical / Trunk Assessment: Kyphotic  Communication   Communication Communication: No apparent difficulties    Cognition Arousal: Alert Behavior During Therapy: WFL for tasks assessed/performed                           PT - Cognition Comments: did not want to discuss much until after his surgery; repeated this multiple times when questions asked Following commands: Intact       Cueing Cueing Techniques: Verbal cues, Tactile cues     General Comments General comments (skin integrity, edema, etc.): sats 96% HR 90 on 2L    Exercises     Assessment/Plan    PT Assessment Patient needs continued PT services  PT Problem List Decreased strength;Decreased activity tolerance;Decreased range of motion;Decreased balance;Decreased mobility;Decreased knowledge of use of DME;Pain       PT Treatment Interventions DME instruction;Gait training;Functional mobility training;Therapeutic activities;Therapeutic exercise;Balance training;Patient/family education;Wheelchair mobility training    PT Goals (Current goals can be found in the Care Plan  section)  Acute Rehab PT Goals Patient Stated Goal: get through his surgery first PT Goal Formulation: With patient Time For Goal Achievement: 11/11/23 Potential to Achieve Goals: Good    Frequency Min 3X/week     Co-evaluation               AM-PAC PT 6 Clicks Mobility  Outcome Measure Help needed turning from your back to your side while in a flat bed without using bedrails?: A Little Help needed moving from lying on your back to sitting on the side of a flat bed without using bedrails?: A Lot Help needed moving to and from a bed to a chair (including a wheelchair)?: A Lot Help needed standing up from a chair using your arms (e.g., wheelchair or bedside chair)?: A Lot Help needed to walk in hospital room?: Total Help needed climbing 3-5 steps with a railing? : Total 6 Click Score: 11    End of Session Equipment Utilized During Treatment: Gait belt;Oxygen Activity Tolerance: Patient tolerated treatment well Patient left: in chair;with call bell/phone within reach;with chair alarm set Nurse Communication: Mobility status PT Visit Diagnosis: Unsteadiness on feet (R26.81);History of falling (Z91.81);Muscle weakness (generalized) (M62.81);Pain Pain - Right/Left: Left Pain - part of body: Shoulder    Time: 9062-8998 PT Time Calculation (min) (ACUTE ONLY): 24 min   Charges:  PT Treatments $Therapeutic Activity: 8-22 mins PT General Charges $$ ACUTE PT VISIT: 1 Visit          Macario RAMAN, PT Acute Rehabilitation Services  Office [phone]   Macario SHAUNNA Soja 10/28/2023, 10:19 AM

## 2023-10-28 NOTE — Progress Notes (Signed)
   Terry Page.  FMW:[id number] DOB: 23-May-1955 DOA: 10/26/2023 PCP: Rolinda Millman, MD    Brief Narrative:  68 year old with a history of lung cancer metastatic to bone and brain, chronic respiratory failure on 4 L home O2, and HTN who has been under Hospice care since September 2025 when he was discovered to have new brain mets who presented to the ER 10/24 after suffering a fall in which he was found to have fractured his left humerus.  The patient lives alone and therefore admission to the hospital was necessary for pain control.  Goals of Care:   Code Status: Do not attempt resuscitation (DNR) PRE-ARREST INTERVENTIONS DESIRED   DVT prophylaxis: enoxaparin  (LOVENOX ) injection 40 mg Start: 10/27/23 1000   Interim Hx: No acute events reported overnight.  Afebrile.  Vital signs stable.  No new complaints today.  Resting comfortably in bed.  Assessment & Plan:  Acute left humerus pathologic fracture Orthopedics planning for surgical correction 10/27 - of note patient states he does not tolerate oxycodone  or fentanyl   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 Has been enrolled in Hospice since September - lives at home alone   Chronic respiratory failure on 4 L nasal cannula baseline Stable at present  Severe protein calorie malnutrition As evidenced by 10% weight loss in 6 months with decreased muscle mass and fat in the setting of terminal cancer  HTN No indication for antihypertensive therapy  Chronic hyponatremia Sodium is stable    Family Communication: No family present at time of exam Disposition: Will be determined postoperatively   Objective: Blood pressure 120/79, pulse 93, temperature 98.1 F (36.7 C), temperature source Oral, resp. rate 16, height 5' 9 (1.753 m), weight 63.5 kg, SpO2 98%.  Intake/Output Summary (Last 24 hours) at 10/28/2023 1019 Last data filed at 10/28/2023 0833 Gross per 24 hour  Intake 180 ml  Output 600 ml  Net -420  ml   Filed Weights   10/26/23 1705  Weight: 63.5 kg    Examination: General: No acute respiratory distress Lungs: Clear to auscultation bilaterally  Cardiovascular: Regular rate and rhythm without murmur  Abdomen: Nontender, nondistended, soft, bowel sounds positive, no rebound Extremities: No significant edema bilateral lower extremities  CBC: Recent Labs  Lab 10/23/23 0942 10/26/23 2143  WBC 11.1* 17.4*  NEUTROABS 7.9* 14.6*  HGB 12.9* 11.6*  HCT 40.0 37.3*  MCV 82.6 85.4  PLT 382 391   Basic Metabolic Panel: Recent Labs  Lab 10/23/23 0942 10/26/23 2143  NA 133* 130*  K 4.0 4.7  CL 96* 97*  CO2 28 22  GLUCOSE 98 102*  BUN 19 13  CREATININE 1.27* 0.96  CALCIUM  10.7* 9.6   GFR: Estimated Creatinine Clearance: 64.3 mL/min (by C-G formula based on SCr of 0.96 mg/dL).   Scheduled Meds:  acetaminophen   1,000 mg Oral TID   enoxaparin  (LOVENOX ) injection  40 mg Subcutaneous Q24H   feeding supplement  237 mL Oral BID BM   mirtazapine   7.5 mg Oral QHS   polyethylene glycol  17 g Oral Daily   senna-docusate  2 tablet Oral BID     LOS: 2 days   Reyes IVAR Moores, MD [facility] Office  [phone] Pager - Text Page per Tracey  If 7PM-7AM, please contact night-coverage per Amion 10/28/2023, 10:19 AM

## 2023-10-28 NOTE — Progress Notes (Signed)
 Terry Page [phone] [facility] liaison note  Terry Page is a current [facility] hospice patient with a terminal diagnosis of malignant neoplasm of upper lobe of left lung. Patient had an unwitnessed fall at home and EMS was activated. He was found to have left humerus fracture and due to pain control was admitted to the hospital with plans for likely surgical fixation on Monday. Per Dr. Vannie with [facility] this is a related hospital admission.   Terry Page remains admitted with pain managed at this time and has been evaluated by therapy who recommends rehab may be needed after surgery prior to transition home. Plan remains for likely surgery tomorrow.   Patient is inpatient appropriate with need for IV pain medications and while awaiting surgical fixation of humerus fracture.  Vital Signs- 97.8/95/21    104/84    spO2 95% on 2L nasal cannula I&O- 240.350 Abnormal Labs- None New   Diagnostics-  None New  IV/PRN Meds- None New  Current plan as per PN Dr. Reyes Moores on 10.25.25  Acute left humerus pathologic fracture Orthopedics to see - of note patient states he does not tolerate oxycodone  or fentanyl    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 Has been enrolled in Hospice since September -lives at home alone    Chronic respiratory failure on 4 L nasal cannula baseline  Discharge Planning- Ongoing, depending on status post surgery Family Contact- Patient is alert and oriented  IDT: updated Goals of Care: DNR Transfer summary and Med list sent to be placed in chart.  Thank you for the opportunity to participate in this patient's care, please don't hesitate to call for any hospice related questions or concerns.  Hunter Seip BSN, [facility] [phone]

## 2023-10-28 NOTE — Progress Notes (Addendum)
 Terry Page [phone] [facility] liaison note  [name] is a current [facility] hospice patient with a terminal diagnosis of malignant neoplasm of upper lobe of left lung. Patient had an unwitnessed fall at home and EMS was activated. He was found to have left humerus fracture and due to pain control was admitted to the hospital with plans for likely surgical fixation on Monday. Per Dr. Vannie with [facility] this is a related hospital admission.   [name] is admitted for pain control for left upper arm fracture that they are planning surgical fixation on Monday. He reports that his pain is managed at this time but that is with the IV pain medication they are giving him. He is agreeable to the suggestion of surgery to aid in pain control.   Patient is inpatient appropriate with need for IV pain medications and while awaiting surgical fixation of humerus fracture.  Vital Signs- 98.3/92/16   143/84   spO2 95% on 2L nasal cannula I&O- 180/250 Abnormal Labs- Na+ 130, Chloride 97, WBC 17.4, Hgb 11.6, Hct 37.3  Diagnostics-   LEFT HUMERUS - 2+ VIEW IMPRESSION: Angulated distal humeral shaft fracture, likely pathologic with suspected underlying lucent lesion. Electronically Signed   By: Andrea Gasman M.D.  IV/PRN Meds- Fentanyl  50mcg IV x2, Dilaudid  0.5mg  IV once and Dilaudid  1mg  IV once  Current plan as per H&P Dr. 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  Lives at home alone.   - Will alert [facility] hospice in AM - Consult [facility]   Discharge Planning- Ongoing, depending on status post surgery Family Contact- Patient is  alert and oriented  IDT: updated Goals of Care: DNR  Transfer summary and Med list sent to be placed in chart.  Thank you for the opportunity to participate in this patient's care, please don't hesitate to call for any hospice related questions or concerns.  Hunter Seip BSN, [facility] [phone]

## 2023-10-29 ENCOUNTER — Inpatient Hospital Stay (HOSPITAL_COMMUNITY)

## 2023-10-29 ENCOUNTER — Encounter (HOSPITAL_COMMUNITY)
Admission: EM | Disposition: A | Discharge status: Skilled Nursing Facility | Payer: Self-pay | Source: Home / Self Care | Attending: Internal Medicine

## 2023-10-29 ENCOUNTER — Inpatient Hospital Stay (HOSPITAL_COMMUNITY): Admitting: Anesthesiology

## 2023-10-29 ENCOUNTER — Encounter (HOSPITAL_COMMUNITY): Payer: Self-pay | Admitting: Family Medicine

## 2023-10-29 DIAGNOSIS — I10 Essential (primary) hypertension: Secondary | ICD-10-CM | POA: Diagnosis not present

## 2023-10-29 DIAGNOSIS — Z87891 Personal history of nicotine dependence: Secondary | ICD-10-CM | POA: Diagnosis not present

## 2023-10-29 DIAGNOSIS — M84422A Pathological fracture, left humerus, initial encounter for fracture: Secondary | ICD-10-CM | POA: Diagnosis not present

## 2023-10-29 DIAGNOSIS — S42402G Unspecified fracture of lower end of left humerus, subsequent encounter for fracture with delayed healing: Secondary | ICD-10-CM | POA: Diagnosis not present

## 2023-10-29 DIAGNOSIS — J449 Chronic obstructive pulmonary disease, unspecified: Secondary | ICD-10-CM | POA: Diagnosis not present

## 2023-10-29 HISTORY — PX: HUMERUS IM NAIL: SHX1769

## 2023-10-29 LAB — SURGICAL PCR SCREEN
MRSA, PCR: NEGATIVE
Staphylococcus aureus: POSITIVE — AB

## 2023-10-29 LAB — VITAMIN D 25 HYDROXY (VIT D DEFICIENCY, FRACTURES): Vit D, 25-Hydroxy: 27.93 ng/mL — ABNORMAL LOW (ref 30–100)

## 2023-10-29 SURGERY — INSERTION, INTRAMEDULLARY ROD, HUMERUS
Anesthesia: General | Site: Arm Upper | Laterality: Left

## 2023-10-29 MED ORDER — LACTATED RINGERS IV SOLN: INTRAVENOUS | Status: DC | PRN

## 2023-10-29 MED ORDER — PROPOFOL 10 MG/ML IV BOLUS
INTRAVENOUS | Status: AC
  Filled 2023-10-29: qty 20

## 2023-10-29 MED ORDER — MUPIROCIN 2 % EX OINT: 1.0000 | TOPICAL_OINTMENT | Freq: Two times a day (BID) | CUTANEOUS | 0 refills | Status: AC

## 2023-10-29 MED ORDER — FENTANYL CITRATE (PF) 250 MCG/5ML IJ SOLN
INTRAMUSCULAR | Status: DC | PRN
  Administered 2023-10-29 (×4): 50 ug via INTRAVENOUS

## 2023-10-29 MED ORDER — METHOCARBAMOL 500 MG PO TABS
500.0000 mg | ORAL_TABLET | Freq: Four times a day (QID) | ORAL | Status: DC | PRN
  Administered 2023-10-30 (×2): 500 mg via ORAL
  Filled 2023-10-29 (×2): qty 1

## 2023-10-29 MED ORDER — DEXAMETHASONE SOD PHOSPHATE PF 10 MG/ML IJ SOLN
INTRAMUSCULAR | Status: DC | PRN
  Administered 2023-10-29: 10 mg via INTRAVENOUS

## 2023-10-29 MED ORDER — CHLORHEXIDINE GLUCONATE 0.12 % MT SOLN
OROMUCOSAL | Status: AC
  Filled 2023-10-29: qty 15

## 2023-10-29 MED ORDER — CEFAZOLIN SODIUM-DEXTROSE 2-4 GM/100ML-% IV SOLN
2.0000 g | Freq: Three times a day (TID) | INTRAVENOUS | Status: AC
  Administered 2023-10-29 – 2023-10-30 (×3): 2 g via INTRAVENOUS
  Filled 2023-10-29 (×3): qty 100

## 2023-10-29 MED ORDER — PROPOFOL 10 MG/ML IV BOLUS
INTRAVENOUS | Status: DC | PRN
Start: 2023-10-29 — End: 2023-10-29
  Administered 2023-10-29: 60 mg via INTRAVENOUS

## 2023-10-29 MED ORDER — LACTATED RINGERS IV SOLN: INTRAVENOUS | Status: DC

## 2023-10-29 MED ORDER — HYDROMORPHONE HCL 1 MG/ML IJ SOLN
INTRAMUSCULAR | Status: AC
  Filled 2023-10-29: qty 1

## 2023-10-29 MED ORDER — CHLORHEXIDINE GLUCONATE 0.12 % MT SOLN: 15.0000 mL | Freq: Once | OROMUCOSAL | Status: AC

## 2023-10-29 MED ORDER — DOCUSATE SODIUM 100 MG PO CAPS
100.0000 mg | ORAL_CAPSULE | Freq: Two times a day (BID) | ORAL | Status: DC
  Administered 2023-10-29 – 2023-10-31 (×5): 100 mg via ORAL
  Filled 2023-10-29 (×7): qty 1

## 2023-10-29 MED ORDER — ORAL CARE MOUTH RINSE
15.0000 mL | Freq: Once | OROMUCOSAL | Status: AC
  Administered 2023-10-29: 15 mL via OROMUCOSAL

## 2023-10-29 MED ORDER — OXYCODONE HCL 5 MG PO TABS: 5.0000 mg | ORAL_TABLET | Freq: Once | ORAL | Status: DC | PRN

## 2023-10-29 MED ORDER — ONDANSETRON HCL 4 MG/2ML IJ SOLN: 4.0000 mg | Freq: Once | INTRAMUSCULAR | Status: DC | PRN

## 2023-10-29 MED ORDER — HYDROMORPHONE HCL 1 MG/ML IJ SOLN
0.5000 mg | INTRAMUSCULAR | Status: DC | PRN
  Administered 2023-10-29: 0.5 mg via INTRAVENOUS
  Filled 2023-10-29 (×3): qty 1

## 2023-10-29 MED ORDER — AMISULPRIDE (ANTIEMETIC) 5 MG/2ML IV SOLN: 10.0000 mg | Freq: Once | INTRAVENOUS | Status: DC | PRN

## 2023-10-29 MED ORDER — METHOCARBAMOL 1000 MG/10ML IJ SOLN
500.0000 mg | Freq: Four times a day (QID) | INTRAMUSCULAR | Status: DC | PRN
  Administered 2023-10-29: 500 mg via INTRAVENOUS
  Filled 2023-10-29: qty 10

## 2023-10-29 MED ORDER — SUGAMMADEX SODIUM 200 MG/2ML IV SOLN
INTRAVENOUS | Status: DC | PRN
  Administered 2023-10-29: 200 mg via INTRAVENOUS

## 2023-10-29 MED ORDER — TRAMADOL HCL 50 MG PO TABS
50.0000 mg | ORAL_TABLET | Freq: Four times a day (QID) | ORAL | Status: DC | PRN
  Administered 2023-10-29 (×2): 100 mg via ORAL
  Administered 2023-10-30 (×2): 50 mg via ORAL
  Administered 2023-10-30 – 2023-10-31 (×2): 100 mg via ORAL
  Administered 2023-10-31: 50 mg via ORAL
  Administered 2023-11-01 – 2023-11-03 (×5): 100 mg via ORAL
  Filled 2023-10-29 (×3): qty 2
  Filled 2023-10-29: qty 1
  Filled 2023-10-29 (×5): qty 2
  Filled 2023-10-29: qty 1
  Filled 2023-10-29: qty 2
  Filled 2023-10-29: qty 1
  Filled 2023-10-29: qty 2
  Filled 2023-10-29: qty 1

## 2023-10-29 MED ORDER — OXYCODONE HCL 5 MG/5ML PO SOLN: 5.0000 mg | Freq: Once | ORAL | Status: DC | PRN

## 2023-10-29 MED ORDER — METOCLOPRAMIDE HCL 5 MG PO TABS
5.0000 mg | ORAL_TABLET | Freq: Three times a day (TID) | ORAL | Status: DC | PRN
  Administered 2023-11-03: 10 mg via ORAL
  Filled 2023-10-29: qty 2

## 2023-10-29 MED ORDER — FENTANYL CITRATE (PF) 100 MCG/2ML IJ SOLN
INTRAMUSCULAR | Status: AC
  Filled 2023-10-29: qty 2

## 2023-10-29 MED ORDER — PHENYLEPHRINE HCL-NACL 20-0.9 MG/250ML-% IV SOLN
INTRAVENOUS | Status: DC | PRN
  Administered 2023-10-29: 35 ug/min via INTRAVENOUS

## 2023-10-29 MED ORDER — ACETAMINOPHEN 10 MG/ML IV SOLN: 1000.0000 mg | Freq: Once | INTRAVENOUS | Status: DC | PRN

## 2023-10-29 MED ORDER — POLYETHYLENE GLYCOL 3350 17 G PO PACK
17.0000 g | PACK | Freq: Every day | ORAL | Status: DC | PRN
  Administered 2023-11-01: 17 g via ORAL
  Filled 2023-10-29: qty 1

## 2023-10-29 MED ORDER — LIDOCAINE 2% (20 MG/ML) 5 ML SYRINGE
INTRAMUSCULAR | Status: DC | PRN
  Administered 2023-10-29: 100 mg via INTRAVENOUS

## 2023-10-29 MED ORDER — HYDROMORPHONE HCL 1 MG/ML IJ SOLN
0.2500 mg | INTRAMUSCULAR | Status: DC | PRN
  Administered 2023-10-29: 0.5 mg via INTRAVENOUS
  Administered 2023-10-29: 0.25 mg via INTRAVENOUS

## 2023-10-29 MED ORDER — ONDANSETRON HCL 4 MG/2ML IJ SOLN
INTRAMUSCULAR | Status: DC | PRN
  Administered 2023-10-29: 4 mg via INTRAVENOUS

## 2023-10-29 MED ORDER — VANCOMYCIN HCL 1000 MG IV SOLR
INTRAVENOUS | Status: AC
  Filled 2023-10-29: qty 20

## 2023-10-29 MED ORDER — CHLORHEXIDINE GLUCONATE 4 % EX SOLN: 1.0000 | CUTANEOUS | 1 refills | Status: AC

## 2023-10-29 MED ORDER — DIPHENHYDRAMINE HCL 12.5 MG/5ML PO ELIX: 12.5000 mg | ORAL_SOLUTION | ORAL | Status: DC | PRN

## 2023-10-29 MED ORDER — CHLORHEXIDINE GLUCONATE CLOTH 2 % EX PADS
6.0000 | MEDICATED_PAD | Freq: Every day | CUTANEOUS | Status: AC
  Administered 2023-10-29 – 2023-11-02 (×4): 6 via TOPICAL

## 2023-10-29 MED ORDER — METOPROLOL TARTRATE 5 MG/5ML IV SOLN
INTRAVENOUS | Status: DC | PRN
Start: 2023-10-29 — End: 2023-10-29
  Administered 2023-10-29: 2 mg via INTRAVENOUS

## 2023-10-29 MED ORDER — PHENYLEPHRINE 80 MCG/ML (10ML) SYRINGE FOR IV PUSH (FOR BLOOD PRESSURE SUPPORT)
PREFILLED_SYRINGE | INTRAVENOUS | Status: DC | PRN
  Administered 2023-10-29 (×3): 80 ug via INTRAVENOUS

## 2023-10-29 MED ORDER — ESMOLOL HCL 100 MG/10ML IV SOLN
INTRAVENOUS | Status: DC | PRN
  Administered 2023-10-29: 20 mg via INTRAVENOUS
  Administered 2023-10-29: 30 mg via INTRAVENOUS

## 2023-10-29 MED ORDER — MUPIROCIN 2 % EX OINT
1.0000 | TOPICAL_OINTMENT | Freq: Two times a day (BID) | CUTANEOUS | Status: AC
  Administered 2023-10-29 – 2023-11-03 (×10): 1 via NASAL
  Filled 2023-10-29 (×3): qty 22

## 2023-10-29 MED ORDER — 0.9 % SODIUM CHLORIDE (POUR BTL) OPTIME
TOPICAL | Status: DC | PRN
  Administered 2023-10-29: 1000 mL

## 2023-10-29 MED ORDER — ROCURONIUM BROMIDE 10 MG/ML (PF) SYRINGE
PREFILLED_SYRINGE | INTRAVENOUS | Status: DC | PRN
Start: 2023-10-29 — End: 2023-10-29
  Administered 2023-10-29: 10 mg via INTRAVENOUS
  Administered 2023-10-29: 50 mg via INTRAVENOUS

## 2023-10-29 MED ORDER — METOCLOPRAMIDE HCL 5 MG/ML IJ SOLN: 5.0000 mg | Freq: Three times a day (TID) | INTRAMUSCULAR | Status: DC | PRN

## 2023-10-29 SURGICAL SUPPLY — 38 items
2.0mm Ball Tipped Guidewire IMPLANT
BAG COUNTER SPONGE SURGICOUNT (BAG) ×1 IMPLANT
BRUSH SCRUB EZ PLAIN DRY (MISCELLANEOUS) ×2 IMPLANT
Bone Stabilization Catheter Kit IMPLANT
CHLORAPREP W/TINT 26 (MISCELLANEOUS) ×1 IMPLANT
COVER SURGICAL LIGHT HANDLE (MISCELLANEOUS) ×2 IMPLANT
DERMABOND ADVANCED .7 DNX12 (GAUZE/BANDAGES/DRESSINGS) IMPLANT
DRAPE C-ARM 42X72 X-RAY (DRAPES) ×1 IMPLANT
DRAPE IMP U-DRAPE 54X76 (DRAPES) ×1 IMPLANT
DRESSING MEPILEX FLEX 4X4 (GAUZE/BANDAGES/DRESSINGS) IMPLANT
DRSG ADAPTIC 3X8 NADH LF (GAUZE/BANDAGES/DRESSINGS) ×1 IMPLANT
Deep Tissue Scorer and Low Profile Stabilizer IMPLANT
ELECTRODE REM PT RTRN 9FT ADLT (ELECTROSURGICAL) IMPLANT
GAUZE PAD ABD 8X10 STRL (GAUZE/BANDAGES/DRESSINGS) ×3 IMPLANT
GAUZE SPONGE 4X4 12PLY STRL (GAUZE/BANDAGES/DRESSINGS) ×1 IMPLANT
GLOVE BIO SURGEON STRL SZ 6.5 (GLOVE) ×3 IMPLANT
GLOVE BIO SURGEON STRL SZ7.5 (GLOVE) ×4 IMPLANT
GLOVE BIOGEL PI IND STRL 6.5 (GLOVE) ×1 IMPLANT
GLOVE BIOGEL PI IND STRL 7.5 (GLOVE) ×1 IMPLANT
GOWN STRL REUS W/ TWL LRG LVL3 (GOWN DISPOSABLE) ×2 IMPLANT
GOWN STRL REUS W/ TWL XL LVL3 (GOWN DISPOSABLE) ×1 IMPLANT
KIT BASIN OR (CUSTOM PROCEDURE TRAY) ×1 IMPLANT
KIT TURNOVER KIT B (KITS) ×1 IMPLANT
MANIFOLD NEPTUNE II (INSTRUMENTS) ×1 IMPLANT
PACK TOTAL JOINT (CUSTOM PROCEDURE TRAY) ×1 IMPLANT
PAD ARMBOARD POSITIONER FOAM (MISCELLANEOUS) ×2 IMPLANT
SOLN 0.9% NACL POUR BTL 1000ML (IV SOLUTION) ×1 IMPLANT
SOLN STERILE WATER BTL 1000 ML (IV SOLUTION) ×1 IMPLANT
STAPLER SKIN PROX 35W (STAPLE) ×1 IMPLANT
SUCTION TUBE FRAZIER 10FR DISP (SUCTIONS) ×1 IMPLANT
SUT MNCRL AB 3-0 PS2 18 (SUTURE) ×1 IMPLANT
SUT MNCRL AB 3-0 PS2 27 (SUTURE) IMPLANT
SUT MON AB 2-0 CT1 36 (SUTURE) ×1 IMPLANT
SUT VIC AB 0 CT1 27XBRD ANBCTR (SUTURE) ×2 IMPLANT
SUT VIC AB 2-0 CT1 27XBRD (SUTURE) ×2 IMPLANT
SUTURE FIBERWR 2-0 18 17.9 3/8 (SUTURE) IMPLANT
TRAY FOLEY MTR SLVR 16FR STAT (SET/KITS/TRAYS/PACK) IMPLANT
YANKAUER SUCT BULB TIP NO VENT (SUCTIONS) ×1 IMPLANT

## 2023-10-29 NOTE — Progress Notes (Signed)
 Terry Page.  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  The patient lives alone and therefore admission to the hospital was necessary for pain control.  Goals of Care:   Code Status: Do not attempt resuscitation (DNR) PRE-ARREST INTERVENTIONS DESIRED   DVT prophylaxis: SCDs Start: 10/29/23 1314 enoxaparin  (LOVENOX ) injection 40 mg Start: 10/27/23 1000   Interim Hx: Afebrile.  Vital signs stable.  No acute events reported overnight.  Resting in bed postoperatively.  Is alert and conversant eating lunch.  In good spirits.  Has no new complaints.  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 Has been enrolled in Hospice since September - lives at home alone   Chronic respiratory failure on 4 L nasal cannula baseline Stable at present  Severe protein calorie malnutrition As evidenced by 10% weight loss in 6 months with decreased muscle mass and fat in the setting of terminal cancer  HTN No indication for antihypertensive therapy  Chronic hyponatremia Sodium is stable    Family Communication: No family present at time of exam Disposition: Will be determined postoperatively based upon performance with PT/OT Other (Comment) (Pt Wants To Discuss After Surgery; Complicated With Hospice Involved And Tried To Begin Discussion With Pt Not Interested)10/28/2023 1006  Objective: Blood pressure (!) 135/94, pulse 97,  temperature (!) 97.5 F (36.4 C), temperature source Oral, resp. rate 15, height 5' 9 (1.753 m), weight 63.5 kg, SpO2 95%.  Intake/Output Summary (Last 24 hours) at 10/29/2023 1804 Last data filed at 10/29/2023 1700 Gross per 24 hour  Intake 1039.83 ml  Output 800 ml  Net 239.83 ml   Filed Weights   10/26/23 1705 10/29/23 0842  Weight: 63.5 kg 63.5 kg    Examination: General: No acute respiratory distress Lungs: Clear to auscultation bilaterally  Cardiovascular: Regular rate and rhythm without murmur  Abdomen: Nontender, nondistended, soft, bowel sounds positive, no rebound Extremities: No significant edema B LE   CBC: Recent Labs  Lab 10/23/23 0942 10/26/23 2143  WBC 11.1* 17.4*  NEUTROABS 7.9* 14.6*  HGB 12.9* 11.6*  HCT 40.0 37.3*  MCV 82.6 85.4  PLT 382 391   Basic Metabolic Panel: Recent Labs  Lab 10/23/23 0942 10/26/23 2143  NA 133* 130*  K 4.0 4.7  CL 96* 97*  CO2 28 22  GLUCOSE 98 102*  BUN 19 13  CREATININE 1.27* 0.96  CALCIUM  10.7* 9.6   GFR: Estimated Creatinine Clearance: 64.3 mL/min (by C-G formula based on SCr of 0.96 mg/dL).   Scheduled Meds:  acetaminophen   1,000 mg Oral TID   chlorhexidine        Chlorhexidine  Gluconate Cloth  6 each Topical Daily   docusate sodium   100 mg Oral BID   enoxaparin  (LOVENOX ) injection  40 mg Subcutaneous Q24H   feeding supplement  237 mL Oral BID BM   mirtazapine   7.5 mg Oral QHS   mupirocin ointment  1 Application Nasal BID  polyethylene glycol  17 g Oral Daily   senna-docusate  2 tablet Oral BID     LOS: 3 days   Reyes IVAR Moores, MD [facility] Office  [phone] Pager - Text Page per Amion  If 7PM-7AM, please contact night-coverage per Amion 10/29/2023, 6:04 PM

## 2023-10-29 NOTE — Transfer of Care (Signed)
 Immediate Anesthesia Transfer of Care Note  Patient: Terry Page.  Procedure(s) Performed: INSERTION, INTRAMEDULLARY ROD, HUMERUS (Left: Arm Upper)  Patient Location: PACU  Anesthesia Type:General  Level of Consciousness: awake, alert , oriented, and patient cooperative  Airway & Oxygen Therapy: Patient Spontanous Breathing and Patient connected to face mask oxygen  Post-op Assessment: Report given to RN, Post -op Vital signs reviewed and stable, Patient moving all extremities, and Patient moving all extremities X 4  Post vital signs: Reviewed and stable  Last Vitals:  Vitals Value Taken Time  BP 102/87 10/29/23 12:15  Temp 36.5 C 10/29/23 12:09  Pulse 89 10/29/23 12:15  Resp 20 10/29/23 12:15  SpO2 95 % 10/29/23 12:15  Vitals shown include unfiled device data.  Last Pain:  Vitals:   10/29/23 0859  TempSrc:   PainSc: 9          Complications: No notable events documented.

## 2023-10-29 NOTE — Op Note (Signed)
 Orthopaedic Surgery Operative Note (CSN: [id number] ) Date of Surgery: 10/29/2023  Admit Date: 10/26/2023   Diagnoses: Pre-Op Diagnoses: Left pathologic distal humeral shaft fracture  Post-Op Diagnosis: Same  Procedures: CPT 24516-Intramedullary fixation of left pathologic humerus fracture  Surgeons : Primary: Kendal Franky SQUIBB, MD  Assistant: Lauraine Moores, PA-C  Location: OR 3   Anesthesia: General   Antibiotics: Ancef  2g preop    Tourniquet time: None    Estimated Blood Loss: 100 mL  Complications: None   Specimens:* No specimens in log *   Implants: Illuminoss 15x222mm intramedullary device  Indications for Surgery: 68 year old male who sustained a pathologic left humeral shaft fracture.  Due to the unstable nature of his injury I recommend proceeding with surgical fixation of his left humerus.  Risks and benefits were discussed with the patient.  Risks include but not limited to bleeding, infection, malunion, nonunion, hardware failure, hardware irritation, nerve or blood vessel injury, elbow and shoulder stiffness, and possible anesthetic complications.  He agreed to proceed with surgery and consent was obtained.  Operative Findings: 1.  Retrograde intramedullary fixation of left pathologic humerus fracture using Illuminoss 15 x 220 mm device.  Procedure: The patient was identified in the preoperative holding area. Consent was confirmed with the patient and their family and all questions were answered. The operative extremity was marked after confirmation with the patient. he was then brought back to the operating room by our anesthesia colleagues.  He was placed under general anesthetic and carefully transferred over to radiolucent flattop table.  The left upper extremity was then prepped and draped in usual sterile fashion.  A timeout was performed to verify the patient, the procedure, and the extremity.  Preoperative antibiotics were dosed.  Fluoroscopic imaging  showed the unstable nature of his injury.  A small incision was made along the lateral epicondyle carried down through skin and subcutaneous tissue.  I then identified appropriate starting point using AP and lateral fluoroscopic imaging.  I then used an awl to enter the distal metaphysis.  I then advanced that all into the metaphysis and to the fracture.  I did have to remove this and placed a finger reduction tool and then I passed a ball-tipped guidewire down the center of the canal and seated it into the proximal segment.  I then measured the length of the study using a 220 mm intramedullary device.  I then reamed from 7 mm up to 8 mm.  We then primed the balloon and passed the internal cannula over the guidewire.  We then removed the guidewire and proceeded to place the balloon up the center of the canal.  The pull-away sleeve was then removed and the polymer was injected into the balloon under fluoroscopic imaging to make sure that it expanded appropriately.  It was able to get 35 mm of polymer into the device.  We then proceeded to cure the polymer with the UV light.  After it was hardened we removed the cannula and the rest of the insertion handle.  We cut it flush to the bone.  Final fluoroscopic imaging was obtained.  The incision was copiously irrigated.  Layered closure of 0 Vicryl, 2-0 Monocryl and Dermabond was used to close the skin.  Sterile dressing was applied.  The patient was then awoke from anesthesia and taken to the PACU in stable condition.  Post Op Plan/Instructions: The patient be nonweightbearing to the left upper extremity.  He will remain in the sling for comfort.  Okay for  gentle elbow and shoulder range of motion.  No DVT prophylaxis is needed for this upper extremity patient.  I was present and performed the entire surgery.  Lauraine Moores, PA-C did assist me throughout the case. An assistant was necessary given the difficulty in approach, maintenance of reduction and ability to  instrument the fracture.   Franky Light, MD [facility]

## 2023-10-29 NOTE — Plan of Care (Signed)
   Problem: Clinical Measurements: Goal: Will remain free from infection Outcome: Progressing   Problem: Pain Managment: Goal: General experience of comfort will improve and/or be controlled Outcome: Progressing   Problem: Safety: Goal: Ability to remain free from injury will improve Outcome: Progressing

## 2023-10-29 NOTE — Care Management Important Message (Signed)
 Important Message  Patient Details  Name: Terry Page. MRN: [id number] Date of Birth: 07-19-1955   Important Message Given:  Yes - Medicare IM     Jon Cruel 10/29/2023, 1:36 PM

## 2023-10-29 NOTE — Anesthesia Postprocedure Evaluation (Signed)
 Anesthesia Post Note  Patient: Terry Page.  Procedure(s) Performed: INSERTION, INTRAMEDULLARY ROD, HUMERUS (Left: Arm Upper)     Patient location during evaluation: PACU Anesthesia Type: General Level of consciousness: awake and alert Pain management: pain level controlled Vital Signs Assessment: post-procedure vital signs reviewed and stable Respiratory status: spontaneous breathing, nonlabored ventilation, respiratory function stable and patient connected to nasal cannula oxygen Cardiovascular status: blood pressure returned to baseline and stable Postop Assessment: no apparent nausea or vomiting Anesthetic complications: no   No notable events documented.  Last Vitals:  Vitals:   10/29/23 1300 10/29/23 1321  BP: 125/85 (!) 140/99  Pulse: 85 84  Resp: 15   Temp: 36.7 C (!) 36.4 C  SpO2: 94% 99%    Last Pain:  Vitals:   10/29/23 1334  TempSrc:   PainSc: 8    Pain Goal:                   Garnette DELENA Gab

## 2023-10-29 NOTE — Anesthesia Preprocedure Evaluation (Signed)
 Anesthesia Evaluation  Patient identified by MRN, date of birth, ID band Patient awake    Reviewed: Allergy & Precautions, H&P , NPO status , Patient's Chart, lab work & pertinent test results  History of Anesthesia Complications Negative for: history of anesthetic complications  Airway Mallampati: II  TM Distance: >3 FB Neck ROM: Full    Dental no notable dental hx.    Pulmonary asthma , COPD, former smoker   Pulmonary exam normal breath sounds clear to auscultation       Cardiovascular hypertension, (-) angina (-) Past MI Normal cardiovascular exam Rhythm:Regular Rate:Normal     Neuro/Psych Intracranial malignancy CVA  negative psych ROS   GI/Hepatic negative GI ROS, Neg liver ROS,,,  Endo/Other  negative endocrine ROS    Renal/GU Renal disease   Prostate cancer    Musculoskeletal Humerus fracture   Abdominal   Peds negative pediatric ROS (+)  Hematology negative hematology ROS (+)   Anesthesia Other Findings   Reproductive/Obstetrics negative OB ROS                              Anesthesia Physical Anesthesia Plan  ASA: 3  Anesthesia Plan: General   Post-op Pain Management:    Induction: Intravenous  PONV Risk Score and Plan: 2 and Ondansetron , Dexamethasone  and Treatment may vary due to age or medical condition  Airway Management Planned: Oral ETT  Additional Equipment: None  Intra-op Plan:   Post-operative Plan: Extubation in OR  Informed Consent: I have reviewed the patients History and Physical, chart, labs and discussed the procedure including the risks, benefits and alternatives for the proposed anesthesia with the patient or authorized representative who has indicated his/her understanding and acceptance.     Dental advisory given  Plan Discussed with: CRNA  Anesthesia Plan Comments:          Anesthesia Quick Evaluation

## 2023-10-29 NOTE — Anesthesia Procedure Notes (Signed)
 Procedure Name: Intubation Date/Time: 10/29/2023 10:26 AM  Performed by: Arvell Edsel HERO, CRNAPre-anesthesia Checklist: Patient identified, Emergency Drugs available, Suction available, Patient being monitored and Timeout performed Patient Re-evaluated:Patient Re-evaluated prior to induction Oxygen Delivery Method: Circle system utilized Preoxygenation: Pre-oxygenation with 100% oxygen Induction Type: IV induction Ventilation: Mask ventilation without difficulty and Oral airway inserted - appropriate to patient size Laryngoscope Size: 3 and Glidescope Grade View: Grade I Tube type: Oral Tube size: 7.0 mm Number of attempts: 1 Airway Equipment and Method: Video-laryngoscopy Placement Confirmation: ETT inserted through vocal cords under direct vision, positive ETCO2 and breath sounds checked- equal and bilateral Secured at: 23 cm Tube secured with: Tape

## 2023-10-30 ENCOUNTER — Encounter: Payer: Self-pay | Admitting: Hematology and Oncology

## 2023-10-30 DIAGNOSIS — S42402G Unspecified fracture of lower end of left humerus, subsequent encounter for fracture with delayed healing: Secondary | ICD-10-CM | POA: Diagnosis not present

## 2023-10-30 LAB — CBC
HCT: 36.5 % — ABNORMAL LOW (ref 39.0–52.0)
Hemoglobin: 11.9 g/dL — ABNORMAL LOW (ref 13.0–17.0)
MCH: 26.9 pg (ref 26.0–34.0)
MCHC: 32.6 g/dL (ref 30.0–36.0)
MCV: 82.4 fL (ref 80.0–100.0)
Platelets: 398 K/uL (ref 150–400)
RBC: 4.43 MIL/uL (ref 4.22–5.81)
RDW: 20.4 % — ABNORMAL HIGH (ref 11.5–15.5)
WBC: 20.4 K/uL — ABNORMAL HIGH (ref 4.0–10.5)
nRBC: 0 % (ref 0.0–0.2)

## 2023-10-30 MED ORDER — VITAMIN D 25 MCG (1000 UNIT) PO TABS
1000.0000 [IU] | ORAL_TABLET | Freq: Every day | ORAL | Status: DC
  Administered 2023-10-30 – 2023-11-03 (×5): 1000 [IU] via ORAL
  Filled 2023-10-30 (×5): qty 1

## 2023-10-30 NOTE — Progress Notes (Signed)
 Terry Page.  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  The patient lives alone and therefore admission to the hospital was necessary for pain control.  Goals of Care:   Code Status: Do not attempt resuscitation (DNR) PRE-ARREST INTERVENTIONS DESIRED   DVT prophylaxis: SCDs Start: 10/29/23 1314 enoxaparin  (LOVENOX ) injection 40 mg Start: 10/27/23 1000   Interim Hx: No acute issues reported overnight.  Afebrile.  Vital signs stable.  Hemoglobin stable postoperatively.  Reports decreased pain in left arm.  No new complaints today.  Appetite is fair.  Assessment & Plan:  Acute left humerus pathologic fracture Orthopedics performed surgical correction 10/27 - of note patient states he does not tolerate oxycodone  or fentanyl  -pain appears to be relatively well-controlled at present - postop care per Ortho is as follows: Weightbearing: NWB LUE ROM: Okay for gentle shoulder and elbow motion as tolerated Incisional and dressing care: Reinforce dressings as needed  Showering: Okay to begin getting incisions wet starting 11/01/2023 Orthopedic device(s): Sling as needed for comfort VTE prophylaxis: Currently on Lovenox . Continue SCDs Dispo: PT/OT as able.  Okay for discharge from ortho standpoint once cleared by medicine team and therapies D/C recommendations: - No pharmacological DVT prophylaxis needed in this ambulatory patient with upper extremity injury -Continue 1000 units Vit D supplementation daily x 90 days Follow - up plan: 2 weeks after d/c for wound check and repeat x-rays  Non-small cell lung  cancer with mets to brain and bone Has been enrolled in Hospice since September - lives at home alone -discharge venue will depend upon his performance with PT/OT for ADLs  Chronic respiratory failure on 4 L nasal cannula baseline Stable at present  Severe protein calorie malnutrition As evidenced by 10% weight loss in 6 months with decreased muscle mass and fat in the setting of terminal cancer  HTN No indication for antihypertensive therapy  Chronic hyponatremia Sodium is stable    Family Communication: No family present at time of exam Disposition: Will be determined postoperatively based upon performance with PT/OT Other (Comment) (Pt Wants To Discuss After Surgery; Complicated With Hospice Involved And Tried To Begin Discussion With Pt Not Interested)10/28/2023 1006  Objective: Blood pressure (!) 133/90, pulse 79, temperature 97.7 F (36.5 C), resp. rate 18, height 5' 9 (1.753 m), weight 63.5 kg, SpO2 100%.  Intake/Output Summary (Last 24 hours) at 10/30/2023 1017 Last data filed at 10/30/2023 0000 Gross per 24 hour  Intake 1039.83 ml  Output 850 ml  Net 189.83 ml   Filed Weights   10/26/23 1705 10/29/23 0842  Weight: 63.5 kg 63.5 kg    Examination: General: No acute respiratory distress Lungs: Clear to auscultation bilaterally  Cardiovascular: Regular rate and rhythm without murmur  Abdomen: Nontender, nondistended, soft, bowel sounds positive, no rebound Extremities: No significant edema B LE   CBC: Recent Labs  Lab 10/26/23 2143 10/30/23 0526  WBC 17.4* 20.4*  NEUTROABS 14.6*  --   HGB 11.6* 11.9*  HCT 37.3* 36.5*  MCV 85.4 82.4  PLT 391 398   Basic Metabolic Panel: Recent Labs  Lab 10/26/23  2143  NA 130*  K 4.7  CL 97*  CO2 22  GLUCOSE 102*  BUN 13  CREATININE 0.96  CALCIUM  9.6   GFR: Estimated Creatinine Clearance: 64.3 mL/min (by C-G formula based on SCr of 0.96 mg/dL).   Scheduled Meds:  acetaminophen   1,000 mg Oral TID    Chlorhexidine  Gluconate Cloth  6 each Topical Daily   cholecalciferol  1,000 Units Oral Daily   docusate sodium   100 mg Oral BID   enoxaparin  (LOVENOX ) injection  40 mg Subcutaneous Q24H   feeding supplement  237 mL Oral BID BM   mirtazapine   7.5 mg Oral QHS   mupirocin ointment  1 Application Nasal BID   polyethylene glycol  17 g Oral Daily   senna-docusate  2 tablet Oral BID     LOS: 4 days   Terry IVAR Moores, MD [facility] Office  [phone] Pager - Text Page per Tracey  If 7PM-7AM, please contact night-coverage per Amion 10/30/2023, 10:17 AM

## 2023-10-30 NOTE — Progress Notes (Addendum)
 [name] [phone] [facility] liaison note  [name] is a current [facility] hospice patient with a terminal diagnosis of malignant neoplasm of upper lobe of left lung. Patient had an unwitnessed fall at home and EMS was activated. He was found to have left humerus fracture and due to pain control was admitted to the hospital with plans for likely surgical fixation on Monday. Per Dr. Vannie with [facility] this is a related hospital admission.   Patient underwent surgery today for surgical fixation which was successful. He will be non weight bearing on that extremity with light ROM and will be in a sling for the time being. He reports pain is controlled.  Patient is inpatient appropriate with need for IV pain medications and surgical fixation of humerus fracture.  Vital Signs- 97.5/97/15   135/94    spO2 95% on 4L I&O- 1330/700 Abnormal Labs- None New   Diagnostics-  DG HUMERUS 2V *L*  IMPRESSION: Postoperative fixation of distal humeral shaft fracture.  Electronically Signed   By: Andrea Gasman M.D.   On: 10/29/2023 15:00  IV/PRN Meds- Ancef  2g q8H x 24H, Dilaudid  0.5mg  IV x3, Dilaudid  1mg  IVx1,Tramadol  50mg  PO Q6H x2  Current plan as per PN Dr. 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  Thank you for the opportunity to participate in this patient's care, please don't hesitate to call for any hospice  related questions or concerns.  Hunter Seip BSN, [facility] [phone]

## 2023-10-30 NOTE — Progress Notes (Signed)
 [name] [phone] [facility] liaison note  [name] is a current [facility] hospice patient with a terminal diagnosis of malignant neoplasm of upper lobe of left lung. Patient had an unwitnessed fall at home and EMS was activated. He was found to have left humerus fracture and due to pain control was admitted to the hospital with plans for likely surgical fixation on Monday. Per Dr. Vannie with [facility] this is a related hospital admission.   [name] is sitting up to side of bed with OT present upon arrival today. Patient is alert and verbally appropriate and attempting to work with OT on arm exercises. Discussed care going forward and patient plans to return home once stable to do so from the hospital. Exchanged report with case manager.   Patient is inpatient appropriate with need for IV pain medications and surgical fixation of humerus fracture.  Vital Signs- 97.7/79/18   133/90   spO2 99% on 4L nasal cannula I&O- not recorded/150 Abnormal Labs- WBC 20.4, Hgb 11.9, Hct 36.5   Diagnostics- None new IV/PRN Meds- Ancef  2g q8H x 24H, Robaxin 500mg  Po x1, Tramadol  50mg  PO x3 Current plan as per PN Dr. Reyes Moores on 10.28.25  Acute left humerus pathologic fracture Orthopedics performed surgical correction 10/27 - of note patient states he does not tolerate oxycodone  or fentanyl  -pain appears to be relatively well-controlled at present - postop care per Ortho is as follows: Weightbearing: NWB LUE ROM: Okay for gentle shoulder and elbow motion as tolerated Incisional and dressing care: Reinforce dressings as needed  Showering: Okay to begin getting incisions wet starting 11/01/2023 Orthopedic device(s): Sling as needed for comfort VTE prophylaxis: Currently on Lovenox . Continue SCDs Dispo: PT/OT as able.  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  Thank you for the opportunity to participate in this patient's care, please don't hesitate to call for any hospice related questions or concerns.  Hunter Seip BSN, [facility] [phone]

## 2023-10-30 NOTE — Progress Notes (Signed)
 Orthopaedic Trauma Progress Note  SUBJECTIVE: Patient doing okay this morning.  Notes some soreness to the left arm but overall pain has been fairly well-controlled.  Arm feels best in the sling.  Has not been out of bed yet since surgery.  I assisted patient in ordering his breakfast this morning.  Denies any numbness or tingling throughout the left arm.  No chest pain. No SOB. No nausea/vomiting. No other complaints.  No family at bedside currently  OBJECTIVE:  Vitals:   10/29/23 1900 10/30/23 0734  BP: (!) 146/88 (!) 133/90  Pulse: (!) 105 79  Resp: 18 18  Temp: 98 F (36.7 C) 97.7 F (36.5 C)  SpO2: 100% 100%    Opiates Today (MME): Today's  total administered Morphine  Milligram Equivalents: 10 Opiates Yesterday (MME): Yesterday's total administered Morphine  Milligram Equivalents: 119  General: Sitting up in bed, no acute distress Respiratory: No increased work of breathing.  Operative Extremity (left upper extremity): Sling in place.  Dressings clean, dry, intact.  Some soreness with palpation to the upper arm and over the elbow.  Less tender to the forearm, wrist, hand.  Tolerates gentle wrist flexion/extension.  Able to wiggle all fingers.  Able to make a full fist. + Radial pulse  IMAGING: Stable post op imaging left humerus  LABS:  Results for orders placed or performed during the hospital encounter of 10/26/23 (from the past 24 hours)  VITAMIN D 25 Hydroxy (Vit-D Deficiency, Fractures)     Status: Abnormal   Collection Time: 10/29/23  2:12 PM  Result Value Ref Range   Vit D, 25-Hydroxy 27.93 (L) 30 - 100 ng/mL  CBC     Status: Abnormal   Collection Time: 10/30/23  5:26 AM  Result Value Ref Range   WBC 20.4 (H) 4.0 - 10.5 K/uL   RBC 4.43 4.22 - 5.81 MIL/uL   Hemoglobin 11.9 (L) 13.0 - 17.0 g/dL   HCT 63.4 (L) 60.9 - 47.9 %   MCV 82.4 80.0 - 100.0 fL   MCH 26.9 26.0 - 34.0 pg   MCHC 32.6 30.0 - 36.0 g/dL   RDW 79.5 (H) 88.4 - 84.4 %   Platelets 398 150 - 400 K/uL    nRBC 0.0 0.0 - 0.2 %    ASSESSMENT: Terry Delis. is a 68 y.o. male, 1 Day Post-Op s/p fall Procedures: INTRAMEDULLARY FIXATION LEFT PATHOLOGIC HUMERUS FRACTURE  CV/Blood loss: Hemoglobin 11.9 this morning.  Stable from preop.  Hemodynamically stable  PLAN: Weightbearing: NWB LUE ROM: Okay for gentle shoulder and elbow motion as tolerated Incisional and dressing care: Reinforce dressings as needed  Showering: Okay to begin getting incisions wet starting 11/01/2023 Orthopedic device(s): Sling as needed for comfort Pain management:  1. Tylenol  1000 mg 3 times daily  2. Robaxin 500 mg q 6 hours PRN 3. Tramadol  50 to 100 mg q 6 hours PRN 4. Dilaudid  0.5-1 mg q 3 hours PRN VTE prophylaxis: Currently on Lovenox . Continue SCDs ID:  Ancef  2gm post op Foley/Lines:  No foley, KVO IVFs Impediments to Fracture Healing: Pathologic fracture. Vit D level 27, started on supplementation Dispo: PT/OT as able.  Okay for discharge from ortho standpoint once cleared by medicine team and therapies  D/C recommendations: - Tramadol , Robaxin, Tylenol  for pain control - No pharmacological DVT prophylaxis needed in this ambulatory patient with upper extremity injury -Continue 1000 units Vit D supplementation daily x 90 days  Follow - up plan: 2 weeks after d/c for wound check and repeat  x-rays   Contact information:  Franky Light MD, Lauraine Moores PA-C. After hours and holidays please check [email or web address] for group call information for [facility]   Lauraine PATRIC Moores, PA-C [phone] (office) [email or web address]

## 2023-10-30 NOTE — Progress Notes (Signed)
 OT Cancellation Note  Patient Details Name: Terry Page. MRN: [id number] DOB: January 29, 1955   Cancelled Treatment:    Reason Eval/Treat Not Completed: Other (comment) OT attempted evaluation, Pt stating he needs to eat prior to eval. OT to follow-up as appropriate as schedule allows.   Maurilio CROME, OTR/LSABRA  [facility]  Office: [phone]   Maurilio PARAS Placido Hangartner 10/30/2023, 9:53 AM

## 2023-10-30 NOTE — Evaluation (Signed)
 Occupational Therapy Evaluation Patient Details Name: Terry Page. MRN: [id number] DOB: Jan 05, 1955 Today's Date: 10/30/2023   History of Present Illness   68 y.o. 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 S/p Intramedullary fixation of left pathologic humerus fracture 10/27.     Clinical Impressions PTA Pt was independent with ADL/IADL tasks and Mod I with functional mobility, using rollator in the home and [facility] outside of home. Pt currently requires up to Mod A for ADL engagement and up to Mod A for functional mobility. Pt is primairly limited by LUE pain and non-functional use, LUE WB precautions, unsteadiness on feet, and generalized weakness. Pt is a current [facility] hospice patient, has yet to begin services in home prior to hospital admission. Pt would benefit from post-acute inpatient therapies <3 hrs/day to maximize his safety and independence, however this would delay his Hospice services, further discussion with Pt to be had. OT to follow Pt acutely to facilitate progress towards functional goals.       If plan is discharge home, recommend the following:   A little help with walking and/or transfers;A little help with bathing/dressing/bathroom;Assistance with cooking/housework;Assist for transportation;Help with stairs or ramp for entrance     Functional Status Assessment   Patient has had a recent decline in their functional status and demonstrates the ability to make significant improvements in function in a reasonable and predictable amount of time.     Equipment Recommendations   BSC/3in1     Recommendations for Other Services         Precautions/Restrictions   Precautions Precautions: Fall;Shoulder Type of Shoulder Precautions: NWB LUE, gentle elbow and shoulder ROM as tolerated,  sling for comfort Shoulder Interventions: Shoulder sling/immobilizer;For comfort Precaution Booklet Issued: Yes (comment) Recall of Precautions/Restrictions: Intact Precaution/Restrictions Comments: LUE sling Required Braces or Orthoses: Sling Restrictions Weight Bearing Restrictions Per Provider Order: Yes LUE Weight Bearing Per Provider Order: Non weight bearing     Mobility Bed Mobility Overal bed mobility: Needs Assistance Bed Mobility: Supine to Sit, Sit to Supine     Supine to sit: Mod assist, HOB elevated Sit to supine: Min assist   General bed mobility comments: Pt requested HHA to raise trunk from elevated HOB. Increased time requied to move BLE off of bed. Pt with difficulty sit to supine transfer as he reported increased LUE pain.    Transfers Overall transfer level: Needs assistance Equipment used: 1 person hand held assist Transfers: Sit to/from Stand Sit to Stand: Min assist           General transfer comment: Pt required two attempts at sit to stand transfer. Increased vebal cues and trunk facilitation on second attempt for Pt to clear bed. Once standing, Pt required Min A to side step to the left towards HOB.      Balance Overall balance assessment: Needs assistance Sitting-balance support: No upper extremity supported, Feet supported Sitting balance-Leahy Scale: Fair Sitting balance - Comments: Sat EOB   Standing balance support: Single extremity supported, During functional activity Standing balance-Leahy Scale: Poor Standing balance comment: reliant on external support to maintain balance                           ADL either performed or assessed with clinical judgement   ADL Overall ADL's :  Needs assistance/impaired Eating/Feeding: Set up;Sitting   Grooming: Set up;Sitting   Upper Body Bathing: Minimal assistance;Sitting   Lower Body Bathing: Moderate assistance;Sitting/lateral leans   Upper Body Dressing : Minimal  assistance;Sitting   Lower Body Dressing: Moderate assistance;Sitting/lateral leans   Toilet Transfer: Minimal assistance;BSC/3in1   Toileting- Clothing Manipulation and Hygiene: Moderate assistance               Vision Patient Visual Report: No change from baseline Vision Assessment?: No apparent visual deficits     Perception         Praxis         Pertinent Vitals/Pain Pain Assessment Pain Assessment: Faces Faces Pain Scale: Hurts even more Pain Location: LUE Pain Descriptors / Indicators: Moaning, Grimacing, Guarding, Sharp Pain Intervention(s): Limited activity within patient's tolerance, Monitored during session     Extremity/Trunk Assessment Upper Extremity Assessment Upper Extremity Assessment: LUE deficits/detail LUE Deficits / Details: S/p Intramedullary fixation of left pathologic humerus fracture LUE: Shoulder pain with ROM LUE Sensation: WNL LUE Coordination: decreased fine motor;decreased gross motor   Lower Extremity Assessment Lower Extremity Assessment: Defer to PT evaluation       Communication Communication Communication: No apparent difficulties   Cognition Arousal: Alert Behavior During Therapy: WFL for tasks assessed/performed Cognition: No apparent impairments                               Following commands: Intact       Cueing  General Comments   Cueing Techniques: Verbal cues;Visual cues;Tactile cues  Pt educated on shoulder precautions and functional implications on ADL tasks. Pt participated in LUE HEP. VSS on 4L O2.   Exercises Exercises: General Upper Extremity, Hand exercises General Exercises - Upper Extremity Shoulder Flexion: PROM, Left, 10 reps Elbow Flexion: AAROM, 10 reps, Left Elbow Extension: AAROM, Left, 10 reps Wrist Flexion: AROM, Left, 10 reps Wrist Extension: AROM, Left, 10 reps Digit Composite Flexion: AROM, Left, 10 reps Composite Extension: AROM, Left, 10 reps Hand Exercises Forearm  Supination: AROM, Left, 10 reps Forearm Pronation: AROM, Left, 10 reps   Shoulder Instructions      Home Living Family/patient expects to be discharged to:: Private residence Living Arrangements: Alone Available Help at Discharge: Family;Available PRN/intermittently (family does not live close enough and are not at home during day. Daughter will sometimes visit at night) Type of Home: Apartment Home Access: Level entry     Home Layout: One level     Bathroom Shower/Tub: Chief Strategy Officer: Standard     Home Equipment: Cane - single point;Tub bench;Rollator (4 wheels);Grab bars - tub/shower;Grab bars - toilet   Additional Comments: does not drive; states home Hospice services have not started      Prior Functioning/Environment Prior Level of Function : Independent/Modified Independent (denies falls)             Mobility Comments: rollator in house ADLs Comments: Independent    OT Problem List: Decreased strength;Decreased range of motion;Decreased activity tolerance;Impaired balance (sitting and/or standing);Decreased safety awareness;Decreased knowledge of use of DME or AE;Decreased knowledge of precautions;Impaired UE functional use;Pain   OT Treatment/Interventions: Self-care/ADL training;Therapeutic exercise;Energy conservation;DME and/or AE instruction;Therapeutic activities;Patient/family education;Balance training      OT Goals(Current goals can be found in the care plan section)   Acute Rehab OT Goals Patient Stated Goal: to get better OT Goal Formulation: With patient Time For Goal Achievement: 11/13/23 Potential to Achieve Goals: Good  ADL Goals Pt Will Perform Upper Body Dressing: with modified independence;sitting Pt Will Perform Lower Body Dressing: with contact guard assist;with adaptive equipment Pt Will Transfer to Toilet: with supervision;bedside commode Pt/caregiver will Perform Home Exercise Program: Left upper extremity;With  written HEP provided;Independently   OT Frequency:  Min 2X/week    Co-evaluation              AM-PAC OT 6 Clicks Daily Activity     Outcome Measure Help from another person eating meals?: A Little Help from another person taking care of personal grooming?: A Little Help from another person toileting, which includes using toliet, bedpan, or urinal?: A Lot Help from another person bathing (including washing, rinsing, drying)?: A Lot Help from another person to put on and taking off regular upper body clothing?: A Little Help from another person to put on and taking off regular lower body clothing?: A Lot 6 Click Score: 15   End of Session Equipment Utilized During Treatment: Other (comment) (sling) Nurse Communication: Mobility status  Activity Tolerance: Patient tolerated treatment well Patient left: in bed;with call bell/phone within reach;with bed alarm set  OT Visit Diagnosis: Unsteadiness on feet (R26.81);Muscle weakness (generalized) (M62.81);Pain Pain - Right/Left: Left Pain - part of body: Arm                Time: 8962-8893 OT Time Calculation (min): 29 min Charges:  OT General Charges $OT Visit: 1 Visit OT Evaluation $OT Eval Moderate Complexity: 1 Mod OT Treatments $Therapeutic Activity: 8-22 mins  Maurilio CROME, OTR/L.  [facility]  Office: [phone]   Maurilio PARAS Zanaria Morell 10/30/2023, 1:59 PM

## 2023-10-30 NOTE — Evaluation (Addendum)
 Physical Therapy Re- Evaluation Patient Details Name: Terry Page. MRN: [id number] DOB: 1955-09-21 Today's Date: 10/30/2023  History of Present Illness  68 y.o. Male who presented 10/26/23 with fall, left humerus fracture. S/p Intramedullary fixation of L pathologic humerus fx 10/27. 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 Pt from home where he was independent, using rollator in home and [facility] out of home. After surgery, pt needing mod A to stand with quad cane and ambulate 40'. He was very unsteady and though he did improve with distance he was unsafe with changes in direction and could not correct for LOB. Patient will benefit from continued inpatient follow up therapy, <3 hours/day.  Current goals still appropriate. Pt currently with functional limitations due to the deficits listed below (see PT Problem List). Pt will benefit from acute skilled PT to increase their independence and safety with mobility to allow discharge.           If plan is discharge home, recommend the following: A lot of help with walking and/or transfers;A lot of help with bathing/dressing/bathroom;Assistance with cooking/housework;Assist for transportation   Can travel by private vehicle   Yes    Equipment Recommendations Other (comment) (quad cane)  Recommendations for Other Services       Functional Status Assessment Patient has had a recent decline in their functional status and demonstrates the ability to make significant improvements in function in a reasonable and predictable amount of time.     Precautions / Restrictions Precautions Precautions: Fall;Shoulder Type of Shoulder Precautions: NWB LUE, gentle elbow and shoulder ROM as tolerated, sling for comfort Shoulder Interventions: Shoulder sling/immobilizer;For  comfort Precaution Booklet Issued: No Recall of Precautions/Restrictions: Intact Precaution/Restrictions Comments: LUE sling Required Braces or Orthoses: Sling Restrictions Weight Bearing Restrictions Per Provider Order: Yes LUE Weight Bearing Per Provider Order: Non weight bearing      Mobility  Bed Mobility Overal bed mobility: Needs Assistance Bed Mobility: Supine to Sit, Sit to Supine     Supine to sit: HOB elevated, Min assist Sit to supine: Min assist   General bed mobility comments: min A to trunk from [facility] elevated position. Min A to LE's to return to supine and assist needed to position in bed    Transfers Overall transfer level: Needs assistance Equipment used: Quad cane Transfers: Sit to/from Stand Sit to Stand: Min assist           General transfer comment: min A for power up and to steady    Ambulation/Gait Ambulation/Gait assistance: Min assist, Mod assist Gait Distance (Feet): 40 Feet Assistive device: Quad cane Gait Pattern/deviations: Step-through pattern, Narrow base of support, Drifts right/left Gait velocity: decreased Gait velocity interpretation: <1.8 ft/sec, indicate of risk for recurrent falls   General Gait Details: pt very unsteady initially and needed mod A to safely step. However, with increased distance was able to put wt through cane on R and ambulate with min A. unsafe with changes in direction  Stairs            Wheelchair Mobility     Tilt Bed    Modified Rankin (Stroke Patients Only)       Balance Overall balance assessment: Needs assistance Sitting-balance support: No upper extremity supported, Feet supported Sitting balance-Leahy Scale: Fair Sitting balance - Comments: Sat EOB   Standing  balance support: Single extremity supported, During functional activity Standing balance-Leahy Scale: Poor Standing balance comment: reliant on external support to maintain balance                              Pertinent Vitals/Pain Pain Assessment Pain Assessment: Faces Faces Pain Scale: Hurts whole lot Pain Location: LUE Pain Descriptors / Indicators: Moaning, Grimacing, Guarding, Sharp Pain Intervention(s): Limited activity within patient's tolerance, Monitored during session, Premedicated before session    Home Living Family/patient expects to be discharged to:: Private residence Living Arrangements: Alone Available Help at Discharge: Family;Available PRN/intermittently (family does not live close enough and are not at home during day. Daughter will sometimes visit at night) Type of Home: Apartment Home Access: Level entry       Home Layout: One level Home Equipment: Cane - single point;Tub bench;Rollator (4 wheels);Grab bars - tub/shower;Grab bars - toilet Additional Comments: does not drive; states home Hospice services have not started    Prior Function Prior Level of Function : Independent/Modified Independent (denies falls)             Mobility Comments: rollator in house ADLs Comments: Independent     Extremity/Trunk Assessment   Upper Extremity Assessment Upper Extremity Assessment: Defer to OT evaluation LUE Deficits / Details: S/p Intramedullary fixation of left pathologic humerus fracture    Lower Extremity Assessment Lower Extremity Assessment: Overall WFL for tasks assessed    Cervical / Trunk Assessment Cervical / Trunk Assessment: Kyphotic  Communication   Communication Communication: No apparent difficulties    Cognition Arousal: Alert Behavior During Therapy: WFL for tasks assessed/performed   PT - Cognitive impairments: No family/caregiver present to determine baseline                       PT - Cognition Comments: answers questions appropriately but yells out into hallway for RN instead of pushing call light and does not seem to fully understand safety precautions Following commands: Intact       Cueing Cueing Techniques:  Verbal cues, Visual cues, Tactile cues     General Comments General comments (skin integrity, edema, etc.): SPO2 96% on 4L O2. HR 139 bpm after ambulation    Exercises General Exercises - Upper Extremity Elbow Flexion: AAROM, 5 reps, Left, Seated Elbow Extension: AAROM, Left, 5 reps Wrist Flexion: AROM, Left, 10 reps   Assessment/Plan    PT Assessment Patient needs continued PT services  PT Problem List Decreased strength;Decreased activity tolerance;Decreased range of motion;Decreased balance;Decreased mobility;Decreased knowledge of use of DME;Pain       PT Treatment Interventions DME instruction;Gait training;Functional mobility training;Therapeutic activities;Therapeutic exercise;Balance training;Patient/family education;Wheelchair mobility training    PT Goals (Current goals can be found in the Care Plan section)  Acute Rehab PT Goals Patient Stated Goal: get better PT Goal Formulation: With patient Time For Goal Achievement: 11/11/23 Potential to Achieve Goals: Good    Frequency Min 3X/week     Co-evaluation               AM-PAC PT 6 Clicks Mobility  Outcome Measure Help needed turning from your back to your side while in a flat bed without using bedrails?: A Little Help needed moving from lying on your back to sitting on the side of a flat bed without using bedrails?: A Lot Help needed moving to and from a bed to a chair (including a wheelchair)?: A Lot Help needed  standing up from a chair using your arms (e.g., wheelchair or bedside chair)?: A Lot Help needed to walk in hospital room?: A Lot Help needed climbing 3-5 steps with a railing? : A Lot 6 Click Score: 13    End of Session Equipment Utilized During Treatment: Gait belt;Oxygen;Other (comment) (sling) Activity Tolerance: Patient tolerated treatment well Patient left: in bed;with bed alarm set;with call bell/phone within reach Nurse Communication: Mobility status PT Visit Diagnosis: Unsteadiness on  feet (R26.81);History of falling (Z91.81);Muscle weakness (generalized) (M62.81);Pain Pain - Right/Left: Left Pain - part of body: Shoulder    Time: 1422-1450 PT Time Calculation (min) (ACUTE ONLY): 28 min   Charges:   PT Evaluation $PT Re-evaluation: 1 Re-eval PT Treatments $Gait Training: 8-22 mins PT General Charges $$ ACUTE PT VISIT: 1 Visit         Richerd Lipoma, PT  Acute Rehab Services Secure chat preferred Office [phone]   Richerd CROME Keiden Deskin 10/30/2023, 4:09 PM

## 2023-10-31 ENCOUNTER — Encounter (HOSPITAL_COMMUNITY): Payer: Self-pay | Admitting: Student

## 2023-10-31 DIAGNOSIS — S42492A Other displaced fracture of lower end of left humerus, initial encounter for closed fracture: Secondary | ICD-10-CM | POA: Diagnosis not present

## 2023-10-31 LAB — CBC
HCT: 35.7 % — ABNORMAL LOW (ref 39.0–52.0)
Hemoglobin: 11.6 g/dL — ABNORMAL LOW (ref 13.0–17.0)
MCH: 26.9 pg (ref 26.0–34.0)
MCHC: 32.5 g/dL (ref 30.0–36.0)
MCV: 82.8 fL (ref 80.0–100.0)
Platelets: 379 K/uL (ref 150–400)
RBC: 4.31 MIL/uL (ref 4.22–5.81)
RDW: 20.7 % — ABNORMAL HIGH (ref 11.5–15.5)
WBC: 14.9 K/uL — ABNORMAL HIGH (ref 4.0–10.5)
nRBC: 0 % (ref 0.0–0.2)

## 2023-10-31 NOTE — Progress Notes (Signed)
 Occupational Therapy Treatment Patient Details Name: Terry Page. MRN: [id number] DOB: January 17, 1955 Today's Date: 10/31/2023   History of present illness 68 y.o. Male who presented 10/26/23 with fall, left humerus fracture. S/p Intramedullary fixation of L pathologic humerus fx 10/27. 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 Focus of session on facilitating progress with functional mobility with quad cane and increasing independence with LUE HEP and ADL tasks. Pt required Min A for ADL and HEP engagement this session, as well as up to Mod A for functional mobility. Pt continues to benefit from skilled OT services, OT to continue per POC. Discussed d/c recommendations with Pt, he expressed desire to pursue short-term rehab prior to returning home. Current therapy d/c recommendation remains appropriate.       If plan is discharge home, recommend the following:  A little help with walking and/or transfers;A little help with bathing/dressing/bathroom;Assistance with cooking/housework;Assist for transportation;Help with stairs or ramp for entrance   Equipment Recommendations  BSC/3in1    Recommendations for Other Services      Precautions / Restrictions Precautions Precautions: Fall;Shoulder Type of Shoulder Precautions: NWB LUE, gentle elbow and shoulder ROM as tolerated, sling for comfort Shoulder Interventions: Shoulder sling/immobilizer;For comfort Precaution Booklet Issued: Yes (comment) Recall of Precautions/Restrictions: Intact Precaution/Restrictions Comments: LUE sling Required Braces or Orthoses: Sling Restrictions Weight Bearing Restrictions Per Provider Order: Yes LUE Weight Bearing Per Provider Order: Non weight bearing       Mobility Bed Mobility Overal bed mobility: Needs Assistance Bed  Mobility: Supine to Sit     Supine to sit: Mod assist, HOB elevated     General bed mobility comments: Mod A HHA to raise trunk from [facility] elevated position. Continued to require HHA to scoot forward to sit EOB.    Transfers Overall transfer level: Needs assistance Equipment used: Quad cane Transfers: Sit to/from Stand, Bed to chair/wheelchair/BSC Sit to Stand: Min assist     Step pivot transfers: Min assist     General transfer comment: Min A to power up from bed slightly elevated. Required initial steadying assistance.     Balance Overall balance assessment: Needs assistance Sitting-balance support: Single extremity supported, Feet supported Sitting balance-Leahy Scale: Fair     Standing balance support: Single extremity supported, During functional activity, Reliant on assistive device for balance Standing balance-Leahy Scale: Poor Standing balance comment: Reliant on quad cane and external support to maintain balance                           ADL either performed or assessed with clinical judgement   ADL Overall ADL's : Needs assistance/impaired                 Upper Body Dressing : Minimal assistance;Sitting       Toilet Transfer: Minimal assistance;Ambulation;BSC/3in1 Statistician Details (indicate cue type and reason): quad cane           General ADL Comments: Pt limited by LUE pain and non-functional use    Extremity/Trunk Assessment Upper Extremity Assessment Upper Extremity Assessment: LUE deficits/detail LUE Deficits / Details: S/p Intramedullary fixation of left pathologic humerus fracture LUE: Shoulder pain with ROM LUE Sensation: WNL LUE Coordination: decreased fine motor;decreased gross motor  Vision   Vision Assessment?: No apparent visual deficits   Perception     Praxis     Communication Communication Communication: No apparent difficulties   Cognition Arousal: Alert Behavior During Therapy: WFL  for tasks assessed/performed Cognition: No apparent impairments                               Following commands: Intact        Cueing   Cueing Techniques: Verbal cues, Visual cues, Tactile cues  Exercises Exercises: General Upper Extremity, Hand exercises General Exercises - Upper Extremity Shoulder Flexion: PROM, Left, 10 reps, Seated Elbow Flexion: AAROM, Left, 10 reps Elbow Extension: AAROM, Left, 10 reps Wrist Flexion: AROM, Both, 10 reps Wrist Extension: AROM, Both, 10 reps Digit Composite Flexion: AROM, Both, 10 reps Composite Extension: AROM, Left, 10 reps Hand Exercises Forearm Supination: AROM, Both, 10 reps Forearm Pronation: AROM, Both, 10 reps    Shoulder Instructions       General Comments SpO2 98% on 4L O2 following transfer. Pt required verbal and tactile cues to engage in HEP, reminders for gentle ROM. Pt reported that he decided he would like to pursue SNF for additional rehab prior to return home.    Pertinent Vitals/ Pain       Pain Assessment Pain Assessment: PAINAD Breathing: occasional labored breathing, short period of hyperventilation Negative Vocalization: occasional moan/groan, low speech, negative/disapproving quality Facial Expression: smiling or inexpressive Body Language: relaxed Consolability: no need to console PAINAD Score: 2 Pain Location: LUE Pain Descriptors / Indicators: Discomfort, Grimacing, Guarding, Moaning Pain Intervention(s): Limited activity within patient's tolerance, Monitored during session  Home Living                                          Prior Functioning/Environment              Frequency  Min 2X/week        Progress Toward Goals  OT Goals(current goals can now be found in the care plan section)  Progress towards OT goals: Progressing toward goals  Acute Rehab OT Goals Patient Stated Goal: to get better OT Goal Formulation: With patient Time For Goal Achievement:  11/13/23 Potential to Achieve Goals: Good ADL Goals Pt Will Perform Upper Body Dressing: with modified independence;sitting Pt Will Perform Lower Body Dressing: with contact guard assist;with adaptive equipment Pt Will Transfer to Toilet: with supervision;bedside commode Pt/caregiver will Perform Home Exercise Program: Left upper extremity;With written HEP provided;Independently  Plan      Co-evaluation                 AM-PAC OT 6 Clicks Daily Activity     Outcome Measure   Help from another person eating meals?: A Little Help from another person taking care of personal grooming?: A Little Help from another person toileting, which includes using toliet, bedpan, or urinal?: A Little Help from another person bathing (including washing, rinsing, drying)?: A Lot Help from another person to put on and taking off regular upper body clothing?: A Little Help from another person to put on and taking off regular lower body clothing?: A Lot 6 Click Score: 16    End of Session Equipment Utilized During Treatment: Gait belt (oxygen, quad cane)  OT Visit Diagnosis: Unsteadiness on feet (R26.81);Muscle weakness (generalized) (M62.81);Pain Pain - Right/Left: Left  Pain - part of body: Arm   Activity Tolerance Patient tolerated treatment well   Patient Left in chair;with call bell/phone within reach;with chair alarm set   Nurse Communication          Time: [phone] OT Time Calculation (min): 31 min  Charges: OT General Charges $OT Visit: 1 Visit OT Treatments $Therapeutic Activity: 8-22 mins $Therapeutic Exercise: 8-22 mins  Maurilio CROME, OTR/L.  [facility] Acute Rehabilitation  Office: [phone]   Maurilio PARAS Kadeja Granada 10/31/2023, 10:19 AM

## 2023-10-31 NOTE — Progress Notes (Signed)
 Terry Page.  FMW:[id number] DOB: [birth year] DOA: 10/26/2023 PCP: Rolinda Millman, MD    Brief Narrative:  68 year old with a history of lung cancer metastatic to bone and brain, chronic respiratory failure on 4 L home O2, and HTN who has been under Hospice care since September 2025 when he was discovered to have new brain mets who presented to the ER 10/24 after suffering a fall in which he was found to have fractured his left humerus.  The patient lives alone and therefore admission to the hospital was necessary for pain control.  Goals of Care:   Code Status: Do not attempt resuscitation (DNR) PRE-ARREST INTERVENTIONS DESIRED   DVT prophylaxis: SCDs Start: 10/29/23 1314 enoxaparin  (LOVENOX ) injection 40 mg Start: 10/27/23 1000   Interim Hx: No acute events recorded overnight.  PT and OT have recommended SNF rehab stay, and I agree this would be in his best interest.  Vital signs are stable and he is afebrile.  Resting comfortably today.  Reports that his pain is well-controlled.  Assessment & Plan:  Acute left humerus pathologic fracture Orthopedics performed surgical correction 10/27 - of note patient states he does not tolerate oxycodone  or fentanyl  - pain appears to be well-controlled at present - postop care per Ortho is as follows: Weightbearing: NWB LUE ROM: Okay for gentle shoulder and elbow motion as tolerated Incisional and dressing care: Reinforce dressings as needed  Showering: Okay to begin getting incisions wet starting 11/01/2023 Orthopedic device(s): Sling as needed for comfort VTE prophylaxis: Currently on Lovenox . Continue SCDs Dispo: PT/OT as able.  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 Has been enrolled in Hospice since September - lives at home alone therefore PT/OT suggesting SNF rehab stay  Chronic respiratory failure on 4 L nasal cannula baseline Stable at present  Severe protein calorie malnutrition As evidenced by 10% weight loss in 6 months with decreased muscle mass and fat in the setting of terminal cancer  HTN No indication for antihypertensive therapy  Chronic hyponatremia Sodium is stable    Family Communication: No family present at time of exam Disposition:  Skilled Nursing-Short Term Rehab (<3 Hours/Day)10/30/2023 1558  Objective: Blood pressure 122/77, pulse 90, temperature 97.7 F (36.5 C), resp. rate 16, height 5' 9 (1.753 m), weight 63.5 kg, SpO2 97%.  Intake/Output Summary (Last 24 hours) at 10/31/2023 0946 Last data filed at 10/30/2023 1100 Gross per 24 hour  Intake --  Output 400 ml  Net -400 ml   Filed Weights   10/26/23 1705 10/29/23 0842  Weight: 63.5 kg 63.5 kg    Examination: General: No acute respiratory distress Lungs: Clear to auscultation bilaterally  Cardiovascular: Regular rate and rhythm without murmur  Abdomen: Nontender, nondistended, soft, bowel sounds positive, no rebound Extremities: No significant edema B LE   CBC: Recent Labs  Lab 10/26/23 2143 10/30/23 0526 10/31/23 0359  WBC 17.4* 20.4* 14.9*  NEUTROABS 14.6*  --   --   HGB 11.6* 11.9* 11.6*  HCT 37.3* 36.5* 35.7*  MCV 85.4 82.4 82.8  PLT 391 398 379   Basic Metabolic Panel: Recent Labs  Lab 10/26/23 2143  NA 130*  K 4.7  CL 97*  CO2 22  GLUCOSE 102*  BUN 13  CREATININE 0.96  CALCIUM  9.6   GFR: Estimated Creatinine Clearance: 64.3 mL/min (by C-G formula based on SCr of 0.96 mg/dL).   Scheduled Meds:  acetaminophen   1,000 mg Oral TID   Chlorhexidine  Gluconate Cloth  6 each Topical Daily   cholecalciferol  1,000 Units Oral Daily   docusate sodium    100 mg Oral BID   enoxaparin  (LOVENOX ) injection  40 mg Subcutaneous Q24H   feeding supplement  237 mL Oral BID BM   mirtazapine   7.5 mg Oral QHS   mupirocin ointment  1 Application Nasal BID   polyethylene glycol  17 g Oral Daily   senna-docusate  2 tablet Oral BID     LOS: 5 days   Terry Page Moores, MD [facility] Office  [phone] Pager - Text Page per Tracey  If 7PM-7AM, please contact night-coverage per Amion 10/31/2023, 9:46 AM

## 2023-10-31 NOTE — [place] FL2 (Signed)
 [place]  MEDICAID FL2 LEVEL OF CARE FORM     IDENTIFICATION  Patient Name: Terry Page. Birthdate: [birth year] Sex: male Admission Date (Current Location): 10/26/2023  [facility] and [place] Number:  Producer, Television/film/video and Address:  [facility] [place], [address] [place], [place] [address]      Provider Number: [id number]  Attending Physician Name and Address:  Danton Reyes DASEN, MD  Relative Name and Phone Number:  Thomas-Scott,Sharon Significant other   [phone]    Current Level of Care: Hospital Recommended Level of Care: Skilled Nursing Facility Prior Approval Number:    Date Approved/Denied:   PASRR Number: [id number]  Discharge Plan: SNF    Current Diagnoses: Patient Active Problem List   Diagnosis Date Noted   Humerus fracture 10/26/2023   DNR (do not resuscitate) 09/16/2023   Cancer associated pain 09/16/2023   Medication management 09/16/2023   Counseling and coordination of care 09/16/2023   ACP (advance care planning) 09/16/2023   Palliative care encounter 09/16/2023   Need for emotional support 09/16/2023   Malignant neoplasm metastatic to brain [facility]) 09/16/2023   Primary malignant neoplasm of lung metastatic to other site [facility]) 09/16/2023   Ileus ([name]) 09/12/2023   AMS (altered mental status) 04/22/2023   Pressure injury of skin 04/22/2023   Wound eschar of foot 04/21/2023   Cerebrovascular disease 04/21/2023   CVA (cerebral vascular accident) ([name]) 04/20/2023   Goals of care, counseling/discussion 11/24/2022   Pain due to onychomycosis of toenails of both feet 04/19/2022   Claudication 01/13/2022   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   Port-A-Cath in place 07/08/2021   Hyperkalemia 04/28/2021   Elevated LFTs 04/28/2021   Falls 04/28/2021   Chronic back pain 04/28/2021   Hemoptysis 05/18/2020   Protein-calorie malnutrition, severe 05/07/2020   Abdominal pain    Necrotizing  pneumonia ([name]) 05/03/2020   Bullous emphysema ([name])    Acute respiratory failure with hypoxia ([name]) 04/12/2020   Pneumonia due to COVID-19 virus 04/10/2020   Primary cancer of left upper lobe of lung ([name]) 04/10/2020   Cavitary pneumonia 04/10/2020   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   Essential hypertension 04/10/2020   HLD (hyperlipidemia) 04/10/2020   Sepsis ([name]) 04/10/2020   Recurrent spontaneous pneumothorax 06/01/2019   Malignant neoplasm of prostate ([name]) 05/20/2019   Hyponatremia 12/04/2017   AKI (acute kidney injury) 12/04/2017   Incidental lung nodule, greater than or equal to 8mm 10/22/2017   COPD with acute exacerbation ([name])    Spontaneous pneumothorax 10/20/2017   Tobacco abuse    Alcohol abuse     Orientation RESPIRATION BLADDER Height & Weight     Self, Time, Situation, Place  O2 Continent Weight: 140 lb (63.5 kg) Height:  5' 9 (175.3 cm)  BEHAVIORAL SYMPTOMS/MOOD NEUROLOGICAL BOWEL NUTRITION STATUS        Diet (see discharge summary)  AMBULATORY STATUS COMMUNICATION OF NEEDS Skin   Limited Assist Verbally Surgical wounds                       Personal Care Assistance Level of Assistance  Bathing, Feeding, Dressing Bathing Assistance: Limited assistance Feeding assistance: Limited assistance Dressing Assistance: Limited assistance     Functional Limitations Info  Sight, Hearing, Speech Sight Info: Adequate Hearing Info: Adequate Speech Info: Adequate    SPECIAL CARE FACTORS FREQUENCY  PT (By licensed PT), OT (By licensed OT)     PT  Frequency: 5x week OT Frequency: 5x week            Contractures Contractures Info: Not present    Additional Factors Info  Code Status, Allergies Code Status Info: DNR Allergies Info: Ms Contin  (Morphine ), Roxicodone  (Oxycodone )           Current Medications (10/31/2023):  This is the current hospital active medication list Current Facility-Administered Medications   Medication Dose Route Frequency Provider Last Rate Last Admin   acetaminophen  (TYLENOL ) tablet 1,000 mg  1,000 mg Oral TID Danton Lauraine LABOR, PA-C   1,000 mg at 10/31/23 9156   albuterol  (PROVENTIL ) (2.5 MG/3ML) 0.083% nebulizer solution 3 mL  3 mL Inhalation Q6H PRN Danton Lauraine LABOR, PA-C       Chlorhexidine  Gluconate Cloth 2 % PADS 6 each  6 each Topical Daily Danton Lauraine LABOR, PA-C   6 each at 10/30/23 1040   cholecalciferol (VITAMIN D3) 25 MCG (1000 UNIT) tablet 1,000 Units  1,000 Units Oral Daily Danton Lauraine LABOR, PA-C   1,000 Units at 10/31/23 9156   diphenhydrAMINE  (BENADRYL ) 12.5 MG/5ML elixir 12.5-25 mg  12.5-25 mg Oral Q4H PRN Danton Lauraine LABOR, PA-C       docusate sodium  (COLACE) capsule 100 mg  100 mg Oral BID Danton Lauraine LABOR, PA-C   100 mg at 10/31/23 0843   enoxaparin  (LOVENOX ) injection 40 mg  40 mg Subcutaneous Q24H Danton Lauraine A, PA-C   40 mg at 10/31/23 0843   feeding supplement (ENSURE PLUS HIGH PROTEIN) liquid 237 mL  237 mL Oral BID BM Danton Lauraine LABOR, PA-C   237 mL at 10/30/23 1041   HYDROmorphone  (DILAUDID ) injection 0.5-1 mg  0.5-1 mg Intravenous Q3H PRN Danton Lauraine LABOR, PA-C   0.5 mg at 10/29/23 1857   lidocaine  (LIDODERM ) 5 % 1 patch  1 patch Transdermal Daily PRN Danton Lauraine LABOR, PA-C   1 patch at 10/30/23 1431   methocarbamol (ROBAXIN) tablet 500 mg  500 mg Oral Q6H PRN Danton Lauraine LABOR, PA-C   500 mg at 10/30/23 2144   Or   methocarbamol (ROBAXIN) injection 500 mg  500 mg Intravenous Q6H PRN Danton Lauraine LABOR, PA-C   500 mg at 10/29/23 2010   metoCLOPramide (REGLAN) tablet 5-10 mg  5-10 mg Oral Q8H PRN Danton Lauraine LABOR, PA-C       Or   metoCLOPramide (REGLAN) injection 5-10 mg  5-10 mg Intravenous Q8H PRN Danton Lauraine LABOR, PA-C       mirtazapine  (REMERON ) tablet 7.5 mg  7.5 mg Oral QHS Danton Lauraine A, PA-C   7.5 mg at 10/30/23 2144   mupirocin ointment (BACTROBAN) 2 % 1 Application  1 Application Nasal BID Danton Lauraine LABOR, PA-C   1 Application at 10/31/23 9156    ondansetron  (ZOFRAN ) tablet 4 mg  4 mg Oral Q6H PRN Danton Lauraine LABOR, PA-C       Or   ondansetron  (ZOFRAN ) injection 4 mg  4 mg Intravenous Q6H PRN Danton, Sarah A, PA-C       polyethylene glycol (MIRALAX  / GLYCOLAX ) packet 17 g  17 g Oral Daily Danton Lauraine LABOR, PA-C   17 g at 10/31/23 9156   polyethylene glycol (MIRALAX  / GLYCOLAX ) packet 17 g  17 g Oral Daily PRN Danton Lauraine LABOR, PA-C       senna-docusate (Senokot-S) tablet 2 tablet  2 tablet Oral BID Danton Lauraine LABOR, PA-C   2 tablet at 10/31/23 9156   traMADol  (ULTRAM ) tablet 50-100 mg  50-100 mg Oral Q6H PRN Danton Lauraine LABOR, PA-C   100 mg at 10/31/23 0500     Discharge Medications: Please see discharge summary for a list of discharge medications.  Relevant Imaging Results:  Relevant Lab Results:   Additional Information SSN [id number]  Bridget Cordella Simmonds, LCSW

## 2023-10-31 NOTE — Progress Notes (Signed)
 Terry Page 4W[address] [facility] liaison note:   [name] revoked his hospice benefit today in order to seek skilled rehab.   Liaison will follow for discharge disposition and a palliative referral will be initiated.   Please don't hesitate to reach out for any hospice related questions or concerns. Hunter Seip, BSN, [facility] liaison note [phone]

## 2023-10-31 NOTE — Progress Notes (Signed)
 Physical Therapy Treatment Patient Details Name: Terry Page. MRN: [id number] DOB: [birth year] Today's Date: 10/31/2023   History of Present Illness 68 y.o. male who presented 10/26/23 with fall, left humerus fracture. S/p Intramedullary fixation of L pathologic humerus fx 10/27. 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    PT Comments  Pt greeted supine in bed, pleasant and agreeable to PT session. He is making steady progress towards his acute PT goals demonstrated by advanced functional mobility with decreased physical assistance. Pt required step by step cues for sequencing of the quad cane during OOB mobility. He was unsteady experiencing 2 LOB to the left requiring modA to correct and minA throughout for safety/stability. Pt was able to slightly increase gait distance. He continues to be limited by LUE pain, weight bearing status (LUE NWB), impaired balance, and decreased activity tolerance. Patient will benefit from continued inpatient follow up therapy, <3 hours/day.     If plan is discharge home, recommend the following: A lot of help with walking and/or transfers;A lot of help with bathing/dressing/bathroom;Assistance with cooking/housework;Assist for transportation   Can travel by private vehicle     Yes  Equipment Recommendations  Cane (quad)    Recommendations for Other Services       Precautions / Restrictions Precautions Precautions: Fall Recall of Precautions/Restrictions: Intact Required Braces or Orthoses: Sling (LUE for comfort) Restrictions Weight Bearing Restrictions Per Provider Order: Yes LUE Weight Bearing Per Provider Order: Non weight bearing     Mobility  Bed Mobility Overal bed mobility: Needs Assistance Bed Mobility: Supine to Sit     Supine to sit: HOB elevated, Min assist     General bed mobility comments: Pt sat up on L side  of bed with increased time. He brought BLE off EOB. Cues for sequencing. Assist via bed pad to scoot hips fwd.    Transfers Overall transfer level: Needs assistance Equipment used: Quad cane Transfers: Sit to/from Stand, Bed to chair/wheelchair/BSC Sit to Stand: Contact guard assist   Step pivot transfers: Min assist       General transfer comment: Cued pt on sequencing. Instructed him to push up from bed with RUE and then transition onto quad cane. Moderate cues for sequencing quad cane. Emphasis to keep AD next to him especially when turning. Fair eccentric control.    Ambulation/Gait Ambulation/Gait assistance: Min assist, Mod assist Gait Distance (Feet): 55 Feet Assistive device: Quad cane Gait Pattern/deviations: Step-through pattern, Decreased step length - right, Decreased step length - left, Decreased stride length, Narrow base of support, Drifts right/left Gait velocity: decreased Gait velocity interpretation: <1.31 ft/sec, indicative of household ambulator   General Gait Details: Step by step cues for sequencing of gait using quad cane. Pt took short slow steps. Limited weight bearing through R hand, encouraged pt to increased pressure in RUE on quad cane for increased stability/support. He was unsteady with 2 LOB to the left requiring modA to control and return pt to center upright position. He had difficulty turning. Limited foot clearance. Pt with fixed gaze on floor cues to look ahead and scan the environment.   Stairs             Wheelchair Mobility     Tilt Bed    Modified Rankin (Stroke Patients Only)       Balance Overall balance assessment: Needs assistance Sitting-balance support: Single extremity  supported, Feet supported Sitting balance-Leahy Scale: Fair Sitting balance - Comments: Pt sat EOB with supervision   Standing balance support: Single extremity supported, During functional activity, Reliant on assistive device for balance Standing  balance-Leahy Scale: Poor Standing balance comment: Pt dependent on quad cane and external support of therapist to maintain balance dynamically                            Communication Communication Communication: No apparent difficulties  Cognition Arousal: Alert Behavior During Therapy: WFL for tasks assessed/performed   PT - Cognitive impairments: No family/caregiver present to determine baseline                       PT - Cognition Comments: Pt A,Ox4 Following commands: Intact      Cueing Cueing Techniques: Verbal cues  Exercises      General Comments General comments (skin integrity, edema, etc.): VSS on 4L O2 via [place]      Pertinent Vitals/Pain Pain Assessment Pain Assessment: Faces Faces Pain Scale: Hurts even more Pain Location: L shoulder Pain Descriptors / Indicators: Operative site guarding, Discomfort, Aching Pain Intervention(s): Monitored during session, Limited activity within patient's tolerance, Repositioned    Home Living                          Prior Function            PT Goals (current goals can now be found in the care plan section) Acute Rehab PT Goals Patient Stated Goal: Go to rehab PT Goal Formulation: With patient Time For Goal Achievement: 11/11/23 Potential to Achieve Goals: Good Progress towards PT goals: Progressing toward goals    Frequency    Min 2X/week      PT Plan      Co-evaluation              AM-PAC PT 6 Clicks Mobility   Outcome Measure  Help needed turning from your back to your side while in a flat bed without using bedrails?: A Little Help needed moving from lying on your back to sitting on the side of a flat bed without using bedrails?: A Little Help needed moving to and from a bed to a chair (including a wheelchair)?: A Little Help needed standing up from a chair using your arms (e.g., wheelchair or bedside chair)?: A Little Help needed to walk in hospital room?: A  Lot Help needed climbing 3-5 steps with a railing? : A Lot 6 Click Score: 16    End of Session Equipment Utilized During Treatment: Gait belt;Oxygen Activity Tolerance: Patient tolerated treatment well Patient left: in chair;with call bell/phone within reach;with chair alarm set Nurse Communication: Mobility status PT Visit Diagnosis: Unsteadiness on feet (R26.81);History of falling (Z91.81);Muscle weakness (generalized) (M62.81);Pain Pain - Right/Left: Left Pain - part of body: Shoulder     Time: 1341-1410 PT Time Calculation (min) (ACUTE ONLY): 29 min  Charges:    $Gait Training: 8-22 mins $Therapeutic Activity: 8-22 mins PT General Charges $$ ACUTE PT VISIT: 1 Visit                     Randall SAUNDERS, PT, DPT Acute Rehabilitation Services Office: [phone] Secure Chat Preferred  Delon CHRISTELLA Callander 10/31/2023, 2:28 PM

## 2023-10-31 NOTE — TOC Initial Note (Addendum)
 Transition of Care (TOC) - Initial/Assessment Note    Patient Details  Name: Terry Page. MRN: [id number] Date of Birth: March 27, 1955  Transition of Care [facility]) CM/SW Contact:    Bridget Cordella Simmonds, LCSW Phone Number: 10/31/2023, 11:31 AM  Clinical Narrative:     CSW spoke with pt regarding PT recommendation for SNF.  Pt from home alone, current services through [facility] hospice.  Pt stating he does want to pursue SNF, does plan to revoke hospice to do so.  Permission given to send out referral in the hub.  Permission given to speak with significant other Reena, son Prentice, daughter Raeann.  Medicare choice document provided.  Pt requesting assistance with HCPOA.  Chaplain paged, will see.  Referral sent out in hub for SNF.               Expected Discharge Plan: Skilled Nursing Facility Barriers to Discharge: Continued Medical Work up, SNF Pending bed offer   Patient Goals and CMS Choice Patient states their goals for this hospitalization and ongoing recovery are:: get back to my house CMS Medicare.gov Compare Post Acute Care list provided to:: Patient Choice offered to / list presented to : Patient      Expected Discharge Plan and Services In-house Referral: Clinical Social Work Discharge Planning Services: CM Consult Post Acute Care Choice: Skilled Nursing Facility Living arrangements for the past 2 months: Apartment                                      Prior Living Arrangements/Services Living arrangements for the past 2 months: Apartment Lives with:: Self Patient language and need for interpreter reviewed:: Yes Do you feel safe going back to the place where you live?: Yes      Need for Family Participation in Patient Care: Yes (Comment) Care giver support system in place?: Yes (comment) Current home services: Hospice ([facility]) Criminal Activity/Legal Involvement Pertinent to Current Situation/Hospitalization: No - Comment as needed  Activities of  Daily Living   ADL Screening (condition at time of admission) Independently performs ADLs?: No Does the patient have a NEW difficulty with bathing/dressing/toileting/self-feeding that is expected to last >3 days?: Yes (Initiates electronic notice to provider for possible OT consult) Does the patient have a NEW difficulty with getting in/out of bed, walking, or climbing stairs that is expected to last >3 days?: Yes (Initiates electronic notice to provider for possible PT consult) Does the patient have a NEW difficulty with communication that is expected to last >3 days?: No Is the patient deaf or have difficulty hearing?: No Does the patient have difficulty seeing, even when wearing glasses/contacts?: No Does the patient have difficulty concentrating, remembering, or making decisions?: No  Permission Sought/Granted Permission sought to share information with : Family Supports Permission granted to share information with : Yes, Verbal Permission Granted  Share Information with NAME: sig other Reena, son Prentice, daughter Raeann  Permission granted to share info w AGENCY: SNF        Emotional Assessment Appearance:: Appears stated age Attitude/Demeanor/Rapport: Engaged Affect (typically observed): Appropriate, Pleasant Orientation: : Oriented to Self, Oriented to Place, Oriented to  Time, Oriented to Situation Alcohol / Substance Use: Not Applicable Psych Involvement: No (comment)  Admission diagnosis:  Humerus fracture [S42.309A] Other closed displaced fracture of distal end of left humerus, initial encounter [S42.492A] Patient Active Problem List   Diagnosis Date Noted   Humerus fracture  10/26/2023   DNR (do not resuscitate) 09/16/2023   Cancer associated pain 09/16/2023   Medication management 09/16/2023   Counseling and coordination of care 09/16/2023   ACP (advance care planning) 09/16/2023   Palliative care encounter 09/16/2023   Need for emotional support 09/16/2023    Malignant neoplasm metastatic to brain ([name]) 09/16/2023   Primary malignant neoplasm of lung metastatic to other site [facility]) 09/16/2023   Ileus ([name]) 09/12/2023   AMS (altered mental status) 04/22/2023   Pressure injury of skin 04/22/2023   Wound eschar of foot 04/21/2023   Cerebrovascular disease 04/21/2023   CVA (cerebral vascular accident) ([name]) 04/20/2023   Goals of care, counseling/discussion 11/24/2022   Pain due to onychomycosis of toenails of both feet 04/19/2022   Claudication 01/13/2022   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   Port-A-Cath in place 07/08/2021   Hyperkalemia 04/28/2021   Elevated LFTs 04/28/2021   Falls 04/28/2021   Chronic back pain 04/28/2021   Hemoptysis 05/18/2020   Protein-calorie malnutrition, severe 05/07/2020   Abdominal pain    Necrotizing pneumonia ([name]) 05/03/2020   Bullous emphysema ([name])    Acute respiratory failure with hypoxia ([name]) 04/12/2020   Pneumonia due to COVID-19 virus 04/10/2020   Primary cancer of left upper lobe of lung ([name]) 04/10/2020   Cavitary pneumonia 04/10/2020   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   Essential hypertension 04/10/2020   HLD (hyperlipidemia) 04/10/2020   Sepsis ([name]) 04/10/2020   Recurrent spontaneous pneumothorax 06/01/2019   Malignant neoplasm of prostate ([name]) 05/20/2019   Hyponatremia 12/04/2017   AKI (acute kidney injury) 12/04/2017   Incidental lung nodule, greater than or equal to 8mm 10/22/2017   COPD with acute exacerbation ([name])    Spontaneous pneumothorax 10/20/2017   Tobacco abuse    Alcohol abuse    PCP:  Rolinda Millman, MD Pharmacy:   [facility] [phone] [address], [place] - [address] AT [facility] [address] [place] [address] Phone: [phone] Fax: [phone]  [place] [facility] [address] [address] Phone: [phone] Fax:  [phone]     Social Drivers of Health (SDOH) Social History: SDOH Screenings   Food Insecurity: No Food Insecurity (10/27/2023)  Housing: Low Risk  (10/27/2023)  Transportation Needs: No Transportation Needs (10/27/2023)  Utilities: Not At Risk (10/27/2023)  Depression (PHQ2-9): Low Risk  (08/03/2023)  Social Connections: Socially Isolated (10/27/2023)  Tobacco Use: Medium Risk (10/29/2023)   SDOH Interventions:     Readmission Risk Interventions    09/13/2023   12:28 PM 04/29/2021    2:00 PM  Readmission Risk Prevention Plan  Transportation Screening Complete Complete  PCP or Specialist Appt within 5-7 Days  Complete  PCP or Specialist Appt within 3-5 Days Complete   Home Care Screening  Complete  Medication Review (RN CM)  Complete  HRI or Home Care Consult Complete   Palliative Care Screening Not Applicable   Medication Review (RN Care Manager) Complete

## 2023-10-31 NOTE — Progress Notes (Addendum)
   10/31/23 1208  Spiritual Encounters  Type of Visit Initial  Reason for visit Advance directives  Advance Directives (For Healthcare)  Does Patient Have a Medical Advance Directive? No  Type of Estate Agent of [place];Living will  Would patient like information on creating a medical advance directive? Yes (Inpatient - patient requests chaplain consult to create a medical advance directive)   Chaplain responded to a page for an AD. Education provided to Pt and wife via phone. Assisted in filling out AD. Notary not available. Planned to revisit Thursday. AD paperwork is on beside tray under other papers.

## 2023-11-01 DIAGNOSIS — S42402G Unspecified fracture of lower end of left humerus, subsequent encounter for fracture with delayed healing: Secondary | ICD-10-CM

## 2023-11-01 MED ORDER — METHOCARBAMOL 500 MG PO TABS: 500.0000 mg | ORAL_TABLET | Freq: Four times a day (QID) | ORAL | 0 refills | Status: AC | PRN

## 2023-11-01 MED ORDER — TRAMADOL HCL 50 MG PO TABS: 50.0000 mg | ORAL_TABLET | Freq: Four times a day (QID) | ORAL | 0 refills | Status: DC | PRN

## 2023-11-01 MED ORDER — VITAMIN D3 25 MCG PO TABS: 1000.0000 [IU] | ORAL_TABLET | Freq: Every day | ORAL | Status: AC

## 2023-11-01 MED ORDER — ACETAMINOPHEN 500 MG PO TABS: 1000.0000 mg | ORAL_TABLET | Freq: Three times a day (TID) | ORAL | Status: AC | PRN

## 2023-11-01 NOTE — Plan of Care (Signed)
  Problem: Education: Goal: Knowledge of General Education information will improve Description: Including pain rating scale, medication(s)/side effects and non-pharmacologic comfort measures Outcome: Progressing   Problem: Clinical Measurements: Goal: Ability to maintain clinical measurements within normal limits will improve Outcome: Progressing   Problem: Activity: Goal: Risk for activity intolerance will decrease Outcome: Progressing   Problem: Elimination: Goal: Will not experience complications related to bowel motility Outcome: Progressing Goal: Will not experience complications related to urinary retention Outcome: Progressing   Problem: Nutrition: Goal: Adequate nutrition will be maintained Outcome: Progressing   Problem: Pain Managment: Goal: General experience of comfort will improve and/or be controlled Outcome: Progressing   Problem: Safety: Goal: Ability to remain free from injury will improve Outcome: Progressing   Problem: Skin Integrity: Goal: Risk for impaired skin integrity will decrease Outcome: Progressing

## 2023-11-01 NOTE — Progress Notes (Addendum)
   11/01/23 1030  Spiritual Encounters  Type of Visit Follow up  Reason for visit Advance directives  Advance Directives (For Healthcare)  Does Patient Have a Medical Advance Directive? Yes  Type of Advance Directive Healthcare Power of Attorney  Copy of Healthcare Power of Attorney in Chart? Yes - validated most recent copy scanned in chart (See row information)   Chaplain present with notary and witnesses after advanced directive education. Family on phone, Notarized AD. This document supercedes any previous AD Pt named Terry Page as HCPOA. If person is unwilling, Terry Page is the pt next choice. Pt did not complete a living will. Chaplain gave Pt two copies along with the original. Documents placed in Pt belonging bag in closet. Uploaded AD into Pt epic.   [employer] [phone]

## 2023-11-01 NOTE — Progress Notes (Signed)
 PROGRESS NOTE  Terry Page.  DOB: 29-Dec-1955  PCP: Rolinda Millman, MD FMW:[id number]  DOA: 10/26/2023  LOS: 6 days  Hospital Day: 7  Subjective: Patient was seen and examined this morning.  Pleasant elderly African-American male.  Lying on bed.  Not in distress.  On 4 L oxygen. Family not at bedside.  Pending SNF. Chart reviewed.   Afebrile, hemodynamically stable  Brief narrative: Terry Page. is a 68 y.o. 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 Lives alone at home. 10/24, patient presented to the ED after a fall. Imaging showed left humerus fracture.   Orthopedics consulted Admitted to [facility] 10/27, underwent intramedullary fixation by Dr. Kendal  Assessment and plan: Acute pathological fracture of left humerus S/p intramedullary fixation -10/27 Dr. Kendal Secondary to fall Imaging and procedure as above  Recommendation from orthopedics: Weightbearing: NWB LUE ROM: Okay for gentle shoulder and elbow motion as tolerated Incisional and dressing care: Reinforce dressings as needed  Showering: Okay to begin getting incisions wet starting 11/01/2023 Orthopedic device(s): Sling as needed for comfort To follow-up with orthopedics in 2 weeks after discharge for wound check and repeat x-rays. Continue vitamin D supplementation Pain regimen --- Scheduled: Tylenol  1 g 3 times daily, lidocaine  patch --- PRN: IV Dilaudid , oral tramadol , Robaxin Bowel regimen: Docusate twice daily, MiraLAX  daily DVT prophylaxis: Currently on Lovenox .  No need of DVT prophylaxis at discharge as patient is ambulatory.   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 Has been enrolled in Hospice since September  lives at home alone therefore PT/OT suggesting SNF rehab  stay   Chronic respiratory failure on 4 L nasal cannula baseline Stable at present   Severe protein calorie malnutrition As evidenced by 10% weight loss in 6 months with decreased muscle mass and fat in the setting of terminal cancer Continue Remeron , Ensure supplement    Mobility:  PT Orders: Active   PT Follow up Rec: Skilled Nursing-Short Term Rehab (<3 Hours/Day)10/31/2023 1300   Goals of care   Code Status: Do not attempt resuscitation (DNR) PRE-ARREST INTERVENTIONS DESIRED     DVT prophylaxis:  SCDs Start: 10/29/23 1314 enoxaparin  (LOVENOX ) injection 40 mg Start: 10/27/23 1000   Antimicrobials: None Fluid: None Consultants: Orthopedics Family Communication: None at bedside  Status: Inpatient Level of care:  Med-Surg   Patient is from: Home Needs to continue in-hospital care: SNF Anticipated d/c to: Medically stable.  Pending insurance authorization      Diet: Ordered Diet Order             Diet regular Room service appropriate? Yes; Fluid consistency: Thin  Diet effective now                   Scheduled Meds:  acetaminophen   1,000 mg Oral TID   Chlorhexidine  Gluconate Cloth  6 each Topical Daily   cholecalciferol  1,000 Units Oral Daily   enoxaparin  (LOVENOX ) injection  40 mg Subcutaneous Q24H   feeding supplement  237 mL Oral BID BM   mirtazapine   7.5 mg Oral QHS   mupirocin ointment  1 Application Nasal BID   senna-docusate  2 tablet Oral BID    PRN meds: albuterol , diphenhydrAMINE , HYDROmorphone  (DILAUDID ) injection, lidocaine , methocarbamol **OR** methocarbamol (ROBAXIN) injection, metoCLOPramide **OR**  metoCLOPramide (REGLAN) injection, ondansetron  **OR** ondansetron  (ZOFRAN ) IV, polyethylene glycol, traMADol    Infusions:    Antimicrobials: Anti-infectives (From admission, onward)    Start     Dose/Rate Route Frequency Ordered Stop   10/29/23 1400  ceFAZolin  (ANCEF ) IVPB 2g/100 mL premix        2 g 200 mL/hr over 30 Minutes  Intravenous Every 8 hours 10/29/23 1313 10/30/23 0637       Objective: Vitals:   11/01/23 0500 11/01/23 0742  BP: 131/84 123/83  Pulse: 93 96  Resp: 17 16  Temp: 98.3 F (36.8 C) 97.8 F (36.6 C)  SpO2: 100% 98%    Intake/Output Summary (Last 24 hours) at 11/01/2023 1340 Last data filed at 11/01/2023 0600 Gross per 24 hour  Intake 480 ml  Output 700 ml  Net -220 ml   Filed Weights   10/26/23 1705 10/29/23 0842  Weight: 63.5 kg 63.5 kg   Weight change:  Body mass index is 20.67 kg/m.   Physical Exam: General exam: Pleasant, elderly African-American male.  Not in distress Skin: No rashes, lesions or ulcers. HEENT: Atraumatic, normocephalic, no obvious bleeding Lungs: Clear to auscultation bilaterally, diminished air entry in both bases CVS: S1, S2, no murmur,   GI/Abd: Soft, nontender, nondistended, bowel sound present,   CNS: Weak.  But alert, awake, oriented x 3 Psychiatry: Sad affect Extremities: No pedal edema, no calf tenderness,   Data Review: I have personally reviewed the laboratory data and studies available.  F/u labs ordered Unresulted Labs (From admission, onward)    None       Signed, Chapman Rota, MD [facility] 11/01/2023

## 2023-11-01 NOTE — Progress Notes (Signed)
 Mobility Specialist Progress Note:    11/01/23 1236  Mobility  Activity Ambulated with assistance (In hallway)  Level of Assistance Moderate assist, patient does 50-74%  Assistive Device Cane (Quad)  Distance Ambulated (ft) 55 ft  LUE Weight Bearing Per Provider Order NWB  Activity Response Tolerated well  Mobility Referral Yes  Mobility visit 1 Mobility  Mobility Specialist Start Time (ACUTE ONLY) 1124  Mobility Specialist Stop Time (ACUTE ONLY) 1148  Mobility Specialist Time Calculation (min) (ACUTE ONLY) 24 min   Received pt in bed and agreeable to mobility. Pt required ModA STS, otherwise MinG. Pt c/o SOB; SPO2 @ 96% on 4 L/min during ambulation. Returned to room without fault. Left pt in chair with alarm on. Personal belongings and call light within reach. All needs met.  Lavanda Pollack Mobility Specialist  Please contact via [employer] or  Rehab Office [phone]

## 2023-11-01 NOTE — Plan of Care (Signed)
   Problem: Education: Goal: Knowledge of General Education information will improve Description: Including pain rating scale, medication(s)/side effects and non-pharmacologic comfort measures Outcome: Progressing   Problem: Activity: Goal: Risk for activity intolerance will decrease Outcome: Progressing

## 2023-11-01 NOTE — TOC Progression Note (Addendum)
 Transition of Care (TOC) - Progression Note    Patient Details  Name: Terry Page. MRN: [id number] Date of Birth: April 10, 1955  Transition of Care [facility]) CM/SW Contact  Luann SHAUNNA Cumming, LCSW Phone Number: 11/01/2023, 1:14 PM  Clinical Narrative:     Pt has no bed offers at this time.  [facility] and [facility] listed as considering. Request update from those facilities regarding offer; informed [facility] is now declining.  [facility] still reviewing.   1605: Still no offers. [facility] still reviewing. CSW expanded SNF search. Notified by [facility] with [facility] that they have sent fl2 to their community liaisons to assist with finding a snf.   Expected Discharge Plan: Skilled Nursing Facility Barriers to Discharge: Continued Medical Work up, SNF Pending bed offer               Expected Discharge Plan and Services In-house Referral: Clinical Social Work Discharge Planning Services: CM Consult Post Acute Care Choice: Skilled Nursing Facility Living arrangements for the past 2 months: Apartment                                       Social Drivers of Health (SDOH) Interventions SDOH Screenings   Food Insecurity: No Food Insecurity (10/27/2023)  Housing: Low Risk  (10/27/2023)  Transportation Needs: No Transportation Needs (10/27/2023)  Utilities: Not At Risk (10/27/2023)  Depression (PHQ2-9): Low Risk  (08/03/2023)  Social Connections: Socially Isolated (10/27/2023)  Tobacco Use: Medium Risk (10/29/2023)    Readmission Risk Interventions    09/13/2023   12:28 PM 04/29/2021    2:00 PM  Readmission Risk Prevention Plan  Transportation Screening Complete Complete  PCP or Specialist Appt within 5-7 Days  Complete  PCP or Specialist Appt within 3-5 Days Complete   Home Care Screening  Complete  Medication Review (RN CM)  Complete  HRI or Home Care Consult Complete   Palliative Care Screening Not Applicable   Medication Review (RN Care Manager) Complete

## 2023-11-02 ENCOUNTER — Ambulatory Visit: Admitting: Podiatry

## 2023-11-02 DIAGNOSIS — S42402G Unspecified fracture of lower end of left humerus, subsequent encounter for fracture with delayed healing: Secondary | ICD-10-CM | POA: Diagnosis not present

## 2023-11-02 MED ORDER — ENSURE PLUS HIGH PROTEIN PO LIQD: 237.0000 mL | Freq: Two times a day (BID) | ORAL | Status: AC

## 2023-11-02 MED ORDER — LIDOCAINE 5 % EX PTCH: 1.0000 | MEDICATED_PATCH | CUTANEOUS | 0 refills | Status: AC

## 2023-11-02 MED ORDER — DIPHENHYDRAMINE HCL 12.5 MG/5ML PO ELIX: 12.5000 mg | ORAL_SOLUTION | ORAL | Status: DC | PRN

## 2023-11-02 MED ORDER — MIRTAZAPINE 7.5 MG PO TABS: 7.5000 mg | ORAL_TABLET | Freq: Every day | ORAL | 0 refills | Status: AC

## 2023-11-02 MED ORDER — TRAMADOL HCL 50 MG PO TABS
50.0000 mg | ORAL_TABLET | Freq: Four times a day (QID) | ORAL | 0 refills | Status: DC | PRN
Start: 2023-11-02 — End: 2023-11-08

## 2023-11-02 NOTE — Progress Notes (Signed)
 Mobility Specialist Progress Note:    11/02/23 1117  Mobility  Activity Ambulated with assistance (In room)  Level of Assistance Moderate assist, patient does 50-74%  Assistive Device Cane (Quad)  Distance Ambulated (ft) 30 ft  LUE Weight Bearing Per Provider Order NWB  Activity Response Tolerated well  Mobility Referral Yes  Mobility visit 1 Mobility  Mobility Specialist Start Time (ACUTE ONLY) [id number]  Mobility Specialist Stop Time (ACUTE ONLY) 1016  Mobility Specialist Time Calculation (min) (ACUTE ONLY) 23 min   Received pt in bed and agreeable to mobility. Found pt on 4 L/min O2. Pt required light ModA STS, otherwise MinG. Pt c/o not feeling the best; unable to specify. VSS throughout. Left pt in bed with alarm on. Personal belongings and call light within reach. All needs met.  Lavanda Pollack Mobility Specialist  Please contact via [employer] or  Rehab Office [phone]

## 2023-11-02 NOTE — TOC Progression Note (Addendum)
 Transition of Care (TOC) - Progression Note    Patient Details  Name: Terry Page. MRN: [id number] Date of Birth: 02-12-1955  Transition of Care [facility]) CM/SW Contact  Bridget Cordella Simmonds, LCSW Phone Number: 11/02/2023, 12:39 PM  Clinical Narrative:   CSW spoke with pt and sig other Reena regarding bed offer at [facility].  Pt agreeable to accept this offer.  CSW confirmed with [facility] that they can receive pt.  Janell/CMA will start aetna auth.  1530: Auth approved: 11/1 - 11/7, certification # [id number].   MD informed, [facility] needs DC summary ASAP for pharmacy, will plan for DC tomorrow.   Expected Discharge Plan: Skilled Nursing Facility Barriers to Discharge: Continued Medical Work up, SNF Pending bed offer               Expected Discharge Plan and Services In-house Referral: Clinical Social Work Discharge Planning Services: CM Consult Post Acute Care Choice: Skilled Nursing Facility Living arrangements for the past 2 months: Apartment                                       Social Drivers of Health (SDOH) Interventions SDOH Screenings   Food Insecurity: No Food Insecurity (10/27/2023)  Housing: Low Risk  (10/27/2023)  Transportation Needs: No Transportation Needs (10/27/2023)  Utilities: Not At Risk (10/27/2023)  Depression (PHQ2-9): Low Risk  (08/03/2023)  Social Connections: Socially Isolated (10/27/2023)  Tobacco Use: Medium Risk (10/29/2023)    Readmission Risk Interventions    09/13/2023   12:28 PM 04/29/2021    2:00 PM  Readmission Risk Prevention Plan  Transportation Screening Complete Complete  PCP or Specialist Appt within 5-7 Days  Complete  PCP or Specialist Appt within 3-5 Days Complete   Home Care Screening  Complete  Medication Review (RN CM)  Complete  HRI or Home Care Consult Complete   Palliative Care Screening Not Applicable   Medication Review (RN Care Manager) Complete

## 2023-11-02 NOTE — Progress Notes (Signed)
 Physical Therapy Treatment Patient Details Name: Terry Page. MRN: [id number] DOB: 06/03/1955 Today's Date: 11/02/2023   History of Present Illness 68 y.o. male who presented 10/26/23 with fall, left humerus fracture. S/p Intramedullary fixation of L pathologic humerus fx 10/27. 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    PT Comments  Pt had decreased activity tolerance with functional mobility. He became tachycardic during gait with HR staying around 120s-130s with a max of 143 bpm on first bout and 150 bpm on second bout. PT facilitated seated rest breaks with pt able to recover to 100s-110s. He ambulated a shorter distance but demonstrated slight carryover in sequencing of the quad cane. Pt continues to require minA for stability during dynamic tasks. Will continue to follow acutely and advance appropriately.    If plan is discharge home, recommend the following: A lot of help with walking and/or transfers;A lot of help with bathing/dressing/bathroom;Assistance with cooking/housework;Assist for transportation   Can travel by private vehicle     Yes  Equipment Recommendations  Cane (quad)    Recommendations for Other Services       Precautions / Restrictions Precautions Precautions: Fall;Shoulder Type of Shoulder Precautions: NWB LUE, gentle elbow and shoulder ROM as tolerated, sling for comfort Shoulder Interventions: Shoulder sling/immobilizer;For comfort Precaution Booklet Issued: Yes (comment) Recall of Precautions/Restrictions: Intact Precaution/Restrictions Comments: Pt with LUE sling donned inappropriately with elbow support at his sholder. Removed and readjusted sling for full support along the entire forearm to distal hand. Required Braces or Orthoses: Sling (LUE for comfort) Restrictions Weight Bearing Restrictions Per Provider Order: Yes LUE Weight  Bearing Per Provider Order: Non weight bearing     Mobility  Bed Mobility Overal bed mobility: Needs Assistance Bed Mobility: Supine to Sit     Supine to sit: HOB elevated, Min assist     General bed mobility comments: Pt sat up on L side of bed with increased time. He brought BLE off EOB. Pt pulled on PT to elevate trunk. Assist to scoot hips fwd with use of bed pad.    Transfers Overall transfer level: Needs assistance Equipment used: Quad cane Transfers: Sit to/from Stand Sit to Stand: Contact guard assist           General transfer comment: Pt stood from lowest bed height and recliner chair. Cued proper hand/foot placement and sequencing. He powered up with light assist. Fair eccentric control.    Ambulation/Gait Ambulation/Gait assistance: Min assist, +2 safety/equipment (Chair Follow) Gait Distance (Feet): 40 Feet (1x30, seated rest break, 1x40, seated rest break) Assistive device: Quad cane Gait Pattern/deviations: Narrow base of support, Drifts right/left, Step-to pattern, Decreased step length - right, Decreased step length - left, Decreased stride length, Step-through pattern Gait velocity: decreased Gait velocity interpretation: <1.8 ft/sec, indicate of risk for recurrent falls   General Gait Details: Pt ambulated with a step-to pattern. He required step by step cues for appropriate technique/sequencing of gait using quad cane. Pt with intermittent improvement and ability to pass opposite foot when stepping. He looked ahead while ambulating and would glance down at his feet. Pt unsteady requiring minA for stability. Close chair follow d/t fatigue. Cued PLB technique throughout. Pt appears to be a mouth breather and would desaturate to 85% SpO2 when not inhaling through his nose, quickly recovered to >88%. PT facilitated seated rest breaks d/t tachycardia with HRmax of 143  and 150bpm, able to reduce to 100-110s with rest.   Stairs             Wheelchair  Mobility     Tilt Bed    Modified Rankin (Stroke Patients Only)       Balance Overall balance assessment: Needs assistance Sitting-balance support: Single extremity supported, Feet supported Sitting balance-Leahy Scale: Fair Sitting balance - Comments: Pt sat EOB with supervision. He required assist to scoot fwd/bkwd.   Standing balance support: Single extremity supported, During functional activity, Reliant on assistive device for balance Standing balance-Leahy Scale: Poor Standing balance comment: Pt dependent on quad cane and external support of therapist.                            Communication Communication Communication: No apparent difficulties  Cognition Arousal: Alert Behavior During Therapy: WFL for tasks assessed/performed   PT - Cognitive impairments: No family/caregiver present to determine baseline                         Following commands: Intact      Cueing Cueing Techniques: Verbal cues  Exercises      General Comments General comments (skin integrity, edema, etc.): Pt greeted on 4L O2 via [place]. SpO2 98% and HR 93. During ambulation, pt became tachycardic with HR 120-150, he mainly stayed within the 120s-130s with intermittent highs of 143bpm and 150bpm. Able to reduce to 100-110s with seated recovery.      Pertinent Vitals/Pain Pain Assessment Pain Assessment: Faces Faces Pain Scale: Hurts little more Pain Location: L shoulder Pain Descriptors / Indicators: Operative site guarding, Grimacing, Discomfort, Aching Pain Intervention(s): Monitored during session, Limited activity within patient's tolerance, Repositioned    Home Living                          Prior Function            PT Goals (current goals can now be found in the care plan section) Acute Rehab PT Goals Patient Stated Goal: Go to rehab PT Goal Formulation: With patient Time For Goal Achievement: 11/11/23 Potential to Achieve Goals:  Good Progress towards PT goals: Progressing toward goals    Frequency    Min 2X/week      PT Plan      Co-evaluation              AM-PAC PT 6 Clicks Mobility   Outcome Measure  Help needed turning from your back to your side while in a flat bed without using bedrails?: A Little Help needed moving from lying on your back to sitting on the side of a flat bed without using bedrails?: A Little Help needed moving to and from a bed to a chair (including a wheelchair)?: A Little Help needed standing up from a chair using your arms (e.g., wheelchair or bedside chair)?: A Little Help needed to walk in hospital room?: Total Help needed climbing 3-5 steps with a railing? : Total 6 Click Score: 14    End of Session Equipment Utilized During Treatment: Gait belt;Oxygen Activity Tolerance: Treatment limited secondary to medical complications (Comment);Patient limited by fatigue (tachycardia) Patient left: in chair;with call bell/phone within reach;with chair alarm set Nurse Communication: Mobility status;Other (comment) (HR response to session) PT Visit Diagnosis: Unsteadiness on feet (R26.81);History of falling (Z91.81);Muscle weakness (generalized) (M62.81);Pain Pain - Right/Left: Left Pain - part  of body: Shoulder     Time: 1354-1420 PT Time Calculation (min) (ACUTE ONLY): 26 min  Charges:    $Gait Training: 23-37 mins PT General Charges $$ ACUTE PT VISIT: 1 Visit                     Randall SAUNDERS, PT, DPT Acute Rehabilitation Services Office: [phone] Secure Chat Preferred  Terry Page 11/02/2023, 3:25 PM

## 2023-11-02 NOTE — Progress Notes (Addendum)
 PROGRESS NOTE  Terry Page.  DOB: February 12, 1955  PCP: Rolinda Millman, MD FMW:[id number]  DOA: 10/26/2023  LOS: 7 days  Hospital Day: 8  Subjective: Patient was seen and examined this morning. Lying down in bed.  Not in distress. No new symptoms. Afebrile, hemodynamically stable, On 4L nasal cannula Pending SNF placement  Brief narrative: Terry Page. is a 68 y.o. 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 Lives alone at home. 10/24, patient presented to the ED after a fall. Imaging showed left humerus fracture.   Orthopedics consulted Admitted to [facility] 10/27, underwent intramedullary fixation by Dr. Kendal  Assessment and plan: Acute pathological fracture of left humerus S/p intramedullary fixation -10/27 Dr. Kendal Secondary to fall Imaging and procedure as above  Recommendation from orthopedics: Weightbearing: NWB LUE ROM: Okay for gentle shoulder and elbow motion as tolerated Incisional and dressing care: Reinforce dressings as needed  Showering: Okay to begin getting incisions wet starting 11/01/2023 Orthopedic device(s): Sling as needed for comfort To follow-up with orthopedics in 2 weeks after discharge for wound check and repeat x-rays. Continue vitamin D supplementation Pain regimen --- Scheduled: Tylenol  1 g 3 times daily, lidocaine  patch --- PRN: IV Dilaudid , oral tramadol , Robaxin Bowel regimen: Docusate twice daily, MiraLAX  daily DVT prophylaxis: Currently on Lovenox .  No need of DVT prophylaxis at discharge as patient is ambulatory.   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 Has been enrolled in Hospice since September  Was living at home alone therefore PT/OT suggested SNF rehab   Chronic respiratory failure on 4 L nasal  cannula baseline Stable at present   Severe protein calorie malnutrition As evidenced by 10% weight loss in 6 months with decreased muscle mass and fat in the setting of terminal cancer Continue Remeron , Ensure supplement   Mobility:  PT Orders: Active   PT Follow up Rec: Skilled Nursing-Short Term Rehab (<3 Hours/Day)10/31/2023 1300   Goals of care   Code Status: Do not attempt resuscitation (DNR) PRE-ARREST INTERVENTIONS DESIRED     DVT prophylaxis:  SCDs Start: 10/29/23 1314 enoxaparin  (LOVENOX ) injection 40 mg Start: 10/27/23 1000   Antimicrobials: None Fluid: None Consultants: Orthopedics Family Communication: None at bedside  Status: Inpatient Level of care:  Med-Surg   Patient is from: Home Needs to continue in-hospital care: SNF Anticipated d/c to: Medically stable.  Pending insurance authorization    Diet: Ordered Diet Order             Diet regular Room service appropriate? Yes; Fluid consistency: Thin  Diet effective now                   Scheduled Meds:  acetaminophen   1,000 mg Oral TID   Chlorhexidine  Gluconate Cloth  6 each Topical Daily   cholecalciferol  1,000 Units Oral Daily   enoxaparin  (LOVENOX ) injection  40 mg Subcutaneous Q24H   feeding supplement  237 mL Oral BID BM   mirtazapine   7.5 mg Oral QHS   mupirocin ointment  1 Application Nasal BID   senna-docusate  2 tablet Oral BID    PRN meds: albuterol , diphenhydrAMINE , HYDROmorphone  (DILAUDID ) injection, lidocaine , methocarbamol **OR** methocarbamol (ROBAXIN) injection, metoCLOPramide **OR** metoCLOPramide (REGLAN) injection, ondansetron  **OR** ondansetron  (ZOFRAN ) IV, polyethylene glycol, traMADol    Infusions:  Antimicrobials: Anti-infectives (From admission, onward)    Start     Dose/Rate Route Frequency Ordered Stop   10/29/23 1400  ceFAZolin  (ANCEF ) IVPB 2g/100 mL premix        2 g 200 mL/hr over 30 Minutes Intravenous Every 8 hours 10/29/23 1313 10/30/23 0637        Objective: Vitals:   11/02/23 0419 11/02/23 0712  BP: (!) 137/90 (!) 149/92  Pulse: 93 94  Resp: 15 16  Temp: (!) 97.5 F (36.4 C) 98.2 F (36.8 C)  SpO2: 100% 99%    Intake/Output Summary (Last 24 hours) at 11/02/2023 1325 Last data filed at 11/02/2023 0000 Gross per 24 hour  Intake --  Output 550 ml  Net -550 ml   Filed Weights   10/26/23 1705 10/29/23 0842  Weight: 63.5 kg 63.5 kg   Weight change:  Body mass index is 20.67 kg/m.   Physical Exam: General exam: Pleasant, elderly African-American male.  Not in pain Skin: No rashes, lesions or ulcers. HEENT: Atraumatic, normocephalic, no obvious bleeding Lungs: Clear to auscultation bilaterally, diminished air entry in both bases CVS: S1, S2, no murmur,   GI/Abd: Soft, nontender, nondistended, bowel sound present,   CNS: Weak.  But alert, awake, oriented x 3 Psychiatry: Sad affect Extremities: No pedal edema, no calf tenderness,   Data Review: I have personally reviewed the laboratory data and studies available.  F/u labs ordered Unresulted Labs (From admission, onward)    None       Signed, Chapman Rota, MD [facility] 11/02/2023

## 2023-11-02 NOTE — Discharge Instructions (Signed)
 [facility] Discharge Instructions   General Discharge Instructions  WEIGHT BEARING STATUS: Non-weightbearing left arm  RANGE OF MOTION/ACTIVITY: Ok for shoulder and elbow range of motion as tolerated.  Use sling as needed for comfort  Wound Care: You may remove your surgical dressing on postop day #2. Incisions can be left open to air if there is no drainage. Once the incision is completely dry and without drainage, it may be left open to air out.  Showering may begin postop day #3).  Clean incision gently with soap and water.  DVT/PE prophylaxis: None  Diet: as you were eating previously.  Can use over the counter stool softeners and bowel preparations, such as Miralax , to help with bowel movements.  Narcotics can be constipating.  Be sure to drink plenty of fluids  PAIN MEDICATION USE AND EXPECTATIONS  You have likely been given narcotic medications to help control your pain.  After a traumatic event that results in an fracture (broken bone) with or without surgery, it is ok to use narcotic pain medications to help control one's pain.  We understand that everyone responds to pain differently and each individual patient will be evaluated on a regular basis for the continued need for narcotic medications. Ideally, narcotic medication use should last no more than 6-8 weeks (coinciding with fracture healing).   As a patient it is your responsibility as well to monitor narcotic medication use and report the amount and frequency you use these medications when you come to your office visit.   We would also advise that if you are using narcotic medications, you should take a dose prior to therapy to maximize you participation.  IF YOU ARE ON NARCOTIC MEDICATIONS IT IS NOT PERMISSIBLE TO OPERATE A MOTOR VEHICLE (MOTORCYCLE/CAR/TRUCK/MOPED) OR HEAVY MACHINERY DO NOT MIX NARCOTICS WITH OTHER CNS (CENTRAL NERVOUS SYSTEM) DEPRESSANTS SUCH AS ALCOHOL  POST-OPERATIVE OPIOID TAPER  INSTRUCTIONS: It is important to wean off of your opioid medication as soon as possible. If you do not need pain medication after your surgery it is ok to stop day one. Opioids include: Codeine, Hydrocodone (Norco, Vicodin), Oxycodone (Percocet, oxycontin ) and hydromorphone  amongst others.  Long term and even short term use of opiods can cause: Increased pain response Dependence Constipation Depression Respiratory depression And more.  Withdrawal symptoms can include Flu like symptoms Nausea, vomiting And more Techniques to manage these symptoms Hydrate well Eat regular healthy meals Stay active Use relaxation techniques(deep breathing, meditating, yoga) Do Not substitute Alcohol to help with tapering If you have been on opioids for less than two weeks and do not have pain than it is ok to stop all together.  Plan to wean off of opioids This plan should start within one week post op of your fracture surgery  Maintain the same interval or time between taking each dose and first decrease the dose.  Cut the total daily intake of opioids by one tablet each day Next start to increase the time between doses. The last dose that should be eliminated is the evening dose.    STOP SMOKING OR USING NICOTINE  PRODUCTS!!!!  As discussed nicotine  severely impairs your body's ability to heal surgical and traumatic wounds but also impairs bone healing.  Wounds and bone heal by forming microscopic blood vessels (angiogenesis) and nicotine  is a vasoconstrictor (essentially, shrinks blood vessels).  Therefore, if vasoconstriction occurs to these microscopic blood vessels they essentially disappear and are unable to deliver necessary nutrients to the healing tissue.  This is one modifiable  factor that you can do to dramatically increase your chances of healing your injury.  (This means no smoking, no nicotine  gum, patches, etc)  DO NOT USE NONSTEROIDAL ANTI-INFLAMMATORY DRUGS (NSAID'S)  Using products such  as Advil  (ibuprofen ), Aleve (naproxen), Motrin  (ibuprofen ) for additional pain control during fracture healing can delay and/or prevent the healing response.  If you would like to take over the counter (OTC) medication, Tylenol  (acetaminophen ) is ok.  However, some narcotic medications that are given for pain control contain acetaminophen  as well. Therefore, you should not exceed more than 4000 mg of tylenol  in a day if you do not have liver disease.  Also note that there are may OTC medicines, such as cold medicines and allergy medicines that my contain tylenol  as well.  If you have any questions about medications and/or interactions please ask your doctor/PA or your pharmacist.      ICE AND ELEVATE INJURED/OPERATIVE EXTREMITY  Using ice and elevating the injured extremity above your heart can help with swelling and pain control.  Icing in a pulsatile fashion, such as 20 minutes on and 20 minutes off, can be followed.    Do not place ice directly on skin. Make sure there is a barrier between to skin and the ice pack.    Using frozen items such as frozen peas works well as the conform nicely to the are that needs to be iced.  USE AN ACE WRAP OR TED HOSE FOR SWELLING CONTROL  In addition to icing and elevation, Ace wraps or TED hose are used to help limit and resolve swelling.  It is recommended to use Ace wraps or TED hose until you are informed to stop.    When using Ace Wraps start the wrapping distally (farthest away from the body) and wrap proximally (closer to the body)   Example: If you had surgery on your leg or thing and you do not have a splint on, start the ace wrap at the toes and work your way up to the thigh        If you had surgery on your upper extremity and do not have a splint on, start the ace wrap at your fingers and work your way up to the upper arm   CALL THE OFFICE FOR MEDICATION REFILLS OR WITH ANY QUESTIONS/CONCERNS: [phone]   VISIT OUR WEBSITE FOR ADDITIONAL  INFORMATION: [email or web address]  Discharge Wound Care Instructions  Do NOT apply any ointments, solutions or lotions to pin sites or surgical wounds.  These prevent needed drainage and even though solutions like hydrogen peroxide kill bacteria, they also damage cells lining the pin sites that help fight infection.  Applying lotions or ointments can keep the wounds moist and can cause them to breakdown and open up as well. This can increase the risk for infection. When in doubt call the office.  Surgical incisions should be dressed daily.  If any drainage is noted, use one layer of adaptic or Mepitel, then gauze, Kerlix, and an ace wrap. - These dressing supplies should be available at local medical supply stores ([facility], [facility], etc) as well as insurance claims handler ([facility], [facility], [place], etc)  Once the incision is completely dry and without drainage, it may be left open to air out.  Showering may begin 36-48 hours later.  Cleaning gently with soap and water.  Call office for the following: Temperature greater than 101F Persistent nausea and vomiting Severe uncontrolled pain Redness, tenderness, or signs of infection (pain, swelling, redness,  odor or green/yellow discharge around the site) Difficulty breathing, headache or visual disturbances Hives Persistent dizziness or light-headedness Extreme fatigue Any other questions or concerns you may have after discharge  In an emergency, call 911 or go to an Emergency Department at a nearby hospital  OTHER HELPFUL INFORMATION  If you had a block, it will wear off between 8-24 hrs postop typically.  This is period when your pain may go from nearly zero to the pain you would have had postop without the block.  This is an abrupt transition but nothing dangerous is happening.  You may take an extra dose of narcotic when this happens.  You should wean off your narcotic medicines as soon as you are able.  Most patients will be off or  using minimal narcotics before their first postop appointment.   We suggest you use the pain medication the first night prior to going to bed, in order to ease any pain when the anesthesia wears off. You should avoid taking pain medications on an empty stomach as it will make you nauseous.  Do not drink alcoholic beverages or take illicit drugs when taking pain medications.  In most states it is against the law to drive while you are in a splint or sling.  And certainly against the law to drive while taking narcotics.  You may return to work/school in the next couple of days when you feel up to it.   Pain medication may make you constipated.  Below are a few solutions to try in this order: Decrease the amount of pain medication if you aren't having pain. Drink lots of decaffeinated fluids. Drink prune juice and/or each dried prunes  If the first 3 don't work start with additional solutions Take Colace - an over-the-counter stool softener Take Senokot - an over-the-counter laxative Take Miralax  - a stronger over-the-counter laxative

## 2023-11-03 ENCOUNTER — Encounter (HOSPITAL_COMMUNITY): Payer: Self-pay | Admitting: Student

## 2023-11-03 DIAGNOSIS — S42402G Unspecified fracture of lower end of left humerus, subsequent encounter for fracture with delayed healing: Secondary | ICD-10-CM | POA: Diagnosis not present

## 2023-11-03 NOTE — TOC Transition Note (Addendum)
 Transition of Care [facility]) - Discharge Note   Patient Details  Name: Terry Page. MRN: [id number] Date of Birth: April 11, 1955  Transition of Care [facility]) CM/SW Contact:  Bridget Cordella Simmonds, LCSW Phone Number: 11/03/2023, 11:11 AM   Clinical Narrative:   Pt discharging to [facility].  RN report to:  [phone].   Pt will be transported by a friend, will need to be brought down to main north tower entrance with assistance getting into the vehicle.   1000: CSW confirmed with [facility] that they can receive pt today.  CSW spoke with pt in room, sig other Reena on speakerphone, discussed transportation: they will check with pt son.  1045: CSW informed that pt friend will transport to SNF.   1130: O2 issue: pt does not have O2 tank at the hospital, does have one at home but sig other is only person with access to the home and she is currently at her HD appt.  CSW message with [facility]: choice was O2 provider, however, since pt has now revoked hospice, unable to bring tank.  CSW spoke with pt and friend, they are agreeable to [facility] transport since it will be covered due to O2 need.   Final next level of care: Skilled Nursing Facility Barriers to Discharge: Barriers Resolved   Patient Goals and CMS Choice Patient states their goals for this hospitalization and ongoing recovery are:: get back to my house CMS Medicare.gov Compare Post Acute Care list provided to:: Patient Choice offered to / list presented to : Patient      Discharge Placement              Patient chooses bed at: [facility] Patient to be transferred to facility by: friend Name of family member notified: sig other: Reena Patient and family notified of of transfer: 11/03/23  Discharge Plan and Services Additional resources added to the After Visit Summary for   In-house Referral: Clinical Social Work Discharge Planning Services: CM Consult Post Acute Care Choice: Skilled Nursing Facility                                Social Drivers of Health (SDOH) Interventions SDOH Screenings   Food Insecurity: No Food Insecurity (10/27/2023)  Housing: Low Risk  (10/27/2023)  Transportation Needs: No Transportation Needs (10/27/2023)  Utilities: Not At Risk (10/27/2023)  Depression (PHQ2-9): Low Risk  (08/03/2023)  Social Connections: Socially Isolated (10/27/2023)  Tobacco Use: Medium Risk (10/29/2023)     Readmission Risk Interventions    09/13/2023   12:28 PM 04/29/2021    2:00 PM  Readmission Risk Prevention Plan  Transportation Screening Complete Complete  PCP or Specialist Appt within 5-7 Days  Complete  PCP or Specialist Appt within 3-5 Days Complete   Home Care Screening  Complete  Medication Review (RN CM)  Complete  HRI or Home Care Consult Complete   Palliative Care Screening Not Applicable   Medication Review (RN Care Manager) Complete

## 2023-11-03 NOTE — Discharge Summary (Signed)
 Physician Discharge Summary  Terry Page. FMW:[id number] DOB: Mar 31, 1955 DOA: 10/26/2023  PCP: Rolinda Millman, MD  Admit date: 10/26/2023 Discharge date: 11/03/2023  Admitted from: Home Discharge disposition: SNF  Recommendations at discharge:  Judicious use of pain medicines Continue vitamin D supplementation  Recommendation from orthopedics: Weightbearing: NWB LUE ROM: Okay for gentle shoulder and elbow motion as tolerated Incisional and dressing care: Reinforce dressings as needed  Showering: Okay to begin getting incisions wet starting 11/01/2023 Orthopedic device(s): Sling as needed for comfort To follow-up with orthopedics in 2 weeks after discharge for wound check and repeat x-rays.   Subjective: Patient was seen and examined this morning. Lying down in bed.  Not in distress. No new symptoms. Afebrile, hemodynamically stable, On 4L nasal cannula Stable for discharge to SNF today.  Brief narrative: Terry Page. is a 68 y.o. 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 Lives alone at home. 10/24, patient presented to the ED after a fall. Imaging showed left humerus fracture.   Orthopedics consulted Admitted to [facility] 10/27, underwent intramedullary fixation by Dr. [facility] course: Acute pathological fracture of left humerus S/p intramedullary fixation -10/27 Dr. Kendal Secondary to fall Imaging and procedure as above  Recommendation from orthopedics: Weightbearing: NWB LUE ROM: Okay for gentle shoulder and elbow motion as tolerated Incisional and dressing care: Reinforce dressings as needed  Showering: Okay to begin getting incisions wet starting 11/01/2023 Orthopedic device(s): Sling as needed for comfort To follow-up with  orthopedics in 2 weeks after discharge for wound check and repeat x-rays. Continue vitamin D supplementation Pain regimen --- Scheduled: Tylenol  1 g 3 times daily, lidocaine  patch --- PRN: oral tramadol , Robaxin Bowel regimen: Docusate twice daily, MiraLAX  daily DVT prophylaxis: No need of DVT prophylaxis at discharge as patient is ambulatory.   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 Yes; Fluid consistency: Thin  Diet effective now                   Nutritional status:  Body mass index is 20.67 kg/m.       Wounds:  - Wound 10/29/23 1048 Surgical Arm Left;Lateral (Active)  Date First Assessed/Time First Assessed: 10/29/23 1048   Primary Wound Type: Surgical  Location: Arm  Location Orientation: Left;Lateral    No assessment data to display     No associated orders.     Wound 10/29/23 1100 Surgical Closed Surgical Incision Arm Left (Active)  Date First Assessed/Time First Assessed: 10/29/23 1100   Primary Wound Type: Surgical  Secondary Wound Type - Surgical: Closed Surgical Incision  Location: Arm  Location Orientation: Left    Assessments 10/29/2023 12:09 PM 11/02/2023  8:08 PM  Site / Wound Assessment Dressing in place / Unable to assess Dressing in place / Unable to assess  Closure -- Approximated  Drainage Amount None None  Dressing Type Other (Comment) Foam - Lift dressing to assess  site every shift  Dressing Status Clean, Dry, Intact Clean, Dry, Intact     No associated orders.    Discharge Medications:   Allergies as of 11/03/2023       Reactions   Ms Contin  [morphine ] Itching   Roxicodone  [oxycodone ] Itching        Medication List     STOP taking these medications    lidocaine -prilocaine  cream Commonly known as: EMLA    Medique Aspirin  325 MG tablet Generic drug: aspirin        TAKE these medications    acetaminophen  500 MG tablet Commonly known as: TYLENOL  Take 2 tablets (1,000 mg total) by mouth every 8 (eight) hours as needed for mild pain (pain score 1-3), fever or headache.   albuterol  108 (90 Base) MCG/ACT inhaler Commonly known as: VENTOLIN  HFA Inhale 2 puffs into the lungs every 6 (six) hours as needed for wheezing or shortness of breath.   chlorhexidine  4 % external liquid Commonly known as: HIBICLENS  Apply 15 mLs (1 Application total) topically as directed for 30 doses. Use as directed daily for 5 days every other week for 6 weeks.   diphenhydrAMINE  12.5 MG/5ML elixir Commonly known as: BENADRYL  Take 5-10 mLs (12.5-25 mg total) by mouth every 4 (four) hours as needed for itching.   feeding supplement Liqd Take 237 mLs by mouth 2 (two) times daily between meals.   folic acid  1 MG tablet Commonly known as: FOLVITE  Take 1 tablet (1 mg total) by mouth daily.   hydrOXYzine  25 MG tablet Commonly known as: ATARAX  Take 1 tablet (25 mg total) by mouth at bedtime as needed for itching.   lidocaine  5 % Commonly known as: LIDODERM  Place 1 patch onto the skin daily. Remove & Discard patch within 12 hours or as directed by MD. Apply to low back What changed:  when to take this reasons to take this additional instructions   methocarbamol 500 MG tablet Commonly known as: ROBAXIN Take 1 tablet (500 mg total) by mouth every 6 (six) hours as needed for muscle spasms.   mirtazapine  7.5 MG tablet Commonly known as: REMERON  Take 1  tablet (7.5 mg total) by mouth at bedtime.   mupirocin ointment 2 % Commonly known as: BACTROBAN Place 1 Application into the nose 2 (two) times daily for 60 doses. Use as directed 2 times daily for 5 days every other week for 6 weeks.   polyethylene glycol powder 17 GM/SCOOP powder Commonly known as: MiraLax  Dissolve 1 capful (17g) in 4-8 ounces of liquid and take by mouth daily. What changed:  when to take this reasons to take this   senna-docusate 8.6-50 MG tablet Commonly known as: Stimulant Laxative Take 2 tablets by mouth 2 (two) times daily. May add additional tablet as needed   traMADol  50 MG tablet Commonly known as: ULTRAM  Take 1 tablet (50 mg total) by mouth every 6 (six) hours as needed for up to 5 days for moderate pain (pain score 4-6) or severe pain (pain score 7-10). What changed:  how much to take reasons to take this   vitamin D3 25 MCG tablet Commonly known as: CHOLECALCIFEROL Take 1 tablet (1,000 Units total) by mouth daily.  Discharge Care Instructions  (From admission, onward)           Start     Ordered   11/03/23 0000  Discharge wound care:        11/03/23 9061             Follow ups:    Follow-up Information     Rolinda Millman, MD Follow up.   Specialty: Family Medicine Contact information: [phone] [place] [address] [phone]         Kendal Franky SQUIBB, MD. Schedule an appointment as soon as possible for a visit in 2 week(s).   Specialty: Orthopedic Surgery Why: for woound check and repeat x-rays Contact information: [address] [place] [address] [phone]         Rolinda Millman, MD Follow up.   Specialty: Family Medicine Contact information: [phone] [place] [address] [phone]                 Discharge Instructions:   Discharge Instructions     Call MD for:  difficulty breathing, headache or visual  disturbances   Complete by: As directed    Call MD for:  extreme fatigue   Complete by: As directed    Call MD for:  hives   Complete by: As directed    Call MD for:  persistant dizziness or light-headedness   Complete by: As directed    Call MD for:  persistant nausea and vomiting   Complete by: As directed    Call MD for:  severe uncontrolled pain   Complete by: As directed    Call MD for:  temperature >100.4   Complete by: As directed    Diet general   Complete by: As directed    Discharge instructions   Complete by: As directed    Recommendations at discharge:   Judicious use of pain medicines  Continue vitamin D supplementation  Recommendation from orthopedics:  Weightbearing: NWB LUE  ROM: Okay for gentle shoulder and elbow motion as tolerated  Incisional and dressing care: Reinforce dressings as needed   Showering: Okay to begin getting incisions wet starting 11/01/2023  Orthopedic device(s): Sling as needed for comfort  To follow-up with orthopedics in 2 weeks after discharge for wound check and repeat x-rays.  PDMP reviewed this encounter.   Opioid taper instructions: It is important to wean off of your opioid medication as soon as possible. If you do not need pain medication after your surgery it is ok to stop day one. Opioids include: Codeine, Hydrocodone (Norco, Vicodin), Oxycodone (Percocet, oxycontin ) and hydromorphone  amongst others.  Long term and even short term use of opiods can cause: Increased pain response Dependence Constipation Depression Respiratory depression And more.  Withdrawal symptoms can include Flu like symptoms Nausea, vomiting And more Techniques to manage these symptoms Hydrate well Eat regular healthy meals Stay active Use relaxation techniques(deep breathing, meditating, yoga) Do Not substitute Alcohol to help with tapering If you have been on opioids for less than two weeks and do not have pain than it is ok to stop all  together.  Plan to wean off of opioids This plan should start within one week post op of your joint replacement. Maintain the same interval or time between taking each dose and first decrease the dose.  Cut the total daily intake of opioids by one tablet each day Next start to increase the time between doses. The last dose that  should be eliminated is the evening dose.        General discharge instructions: Follow with Primary MD Rolinda Millman, MD in 7 days  Please request your PCP  to go over your hospital tests, procedures, radiology results at the follow up. Please get your medicines reviewed and adjusted.  Your PCP may decide to repeat certain labs or tests as needed. Do not drive, operate heavy machinery, perform activities at heights, swimming or participation in water activities or provide baby sitting services if your were admitted for syncope or siezures until you have seen by Primary MD or a Neurologist and advised to do so again. [place]  Controlled Substance Reporting System database was reviewed. Do not drive, operate heavy machinery, perform activities at heights, swim, participate in water activities or provide baby-sitting services while on medications for pain, sleep and mood until your outpatient physician has reevaluated you and advised to do so again.  You are strongly recommended to comply with the dose, frequency and duration of prescribed medications. Activity: As tolerated with Full fall precautions use walker/cane & assistance as needed Avoid using any recreational substances like cigarette, tobacco, alcohol, or non-prescribed drug. If you experience worsening of your admission symptoms, develop shortness of breath, life threatening emergency, suicidal or homicidal thoughts you must seek medical attention immediately by calling 911 or calling your MD immediately  if symptoms less severe. You must read complete instructions/literature along with all the possible  adverse reactions/side effects for all the medicines you take and that have been prescribed to you. Take any new medicine only after you have completely understood and accepted all the possible adverse reactions/side effects.  Wear Seat belts while driving. You were cared for by a hospitalist during your hospital stay. If you have any questions about your discharge medications or the care you received while you were in the hospital after you are discharged, you can call the unit and ask to speak with the hospitalist or the covering physician. Once you are discharged, your primary care physician will handle any further medical issues. Please note that NO REFILLS for any discharge medications will be authorized once you are discharged, as it is imperative that you return to your primary care physician (or establish a relationship with a primary care physician if you do not have one).   Discharge wound care:   Complete by: As directed    Increase activity slowly   Complete by: As directed        Discharge Exam:   Vitals:   11/02/23 0712 11/02/23 1337 11/03/23 0600 11/03/23 0742  BP: (!) 149/92 116/86 138/83 124/89  Pulse: 94 (!) 107 91 94  Resp: 16 16  14   Temp: 98.2 F (36.8 C) 98 F (36.7 C) 98.4 F (36.9 C)   TempSrc: Oral Oral Oral   SpO2: 99% 96% 100% 98%  Weight:      Height:        Body mass index is 20.67 kg/m.   General exam: Pleasant, elderly African-American male.  Not in pain Skin: No rashes, lesions or ulcers. HEENT: Atraumatic, normocephalic, no obvious bleeding Lungs: Clear to auscultation bilaterally, diminished air entry in both bases CVS: S1, S2, no murmur,   GI/Abd: Soft, nontender, nondistended, bowel sound present,   CNS: Weak.  But alert, awake, oriented x 3 Psychiatry: Sad affect Extremities: No pedal edema, no calf tenderness,    The results of significant diagnostics from this hospitalization (including imaging, microbiology, ancillary and laboratory)  are  listed below for reference.    Procedures and Diagnostic Studies:   DG Humerus Left Result Date: 10/26/2023 CLINICAL DATA:  Status post fall with left arm pain. Patient with history of metastatic lung cancer. EXAM: LEFT HUMERUS - 2+ VIEW COMPARISON:  None Available. FINDINGS: Angulated distal humeral shaft fracture, likely pathologic with suspected underlying lucent lesion. Mild apex lateral angulation. There is no intra-articular involvement. Elbow and shoulder alignment are maintained. Mild soft tissue edema at the fracture site. IMPRESSION: Angulated distal humeral shaft fracture, likely pathologic with suspected underlying lucent lesion. Electronically Signed   By: Andrea Gasman M.D.   On: 10/26/2023 18:12   CT Cervical Spine Wo Contrast Result Date: 10/26/2023 CLINICAL DATA:  Neck trauma, unwitnessed fall. EXAM: CT CERVICAL SPINE WITHOUT CONTRAST TECHNIQUE: Multidetector CT imaging of the cervical spine was performed without intravenous contrast. Multiplanar CT image reconstructions were also generated. RADIATION DOSE REDUCTION: This exam was performed according to the departmental dose-optimization program which includes automated exposure control, adjustment of the mA and/or kV according to patient size and/or use of iterative reconstruction technique. COMPARISON:  Brain MRI 09/15/2023 reviewed FINDINGS: Alignment: Normal. Skull base and vertebrae: No acute fracture. Vertebral body heights are maintained. The dens and skull base are intact. Vague area of sclerosis in the right aspect of C1 corresponding to suspected lesion on prior MRI no extraosseous mass effect. Incidental non fusion posterior arch of C1. Soft tissues and spinal canal: No prevertebral fluid or swelling. No visible canal hematoma. Disc levels: Multilevel degenerative disc disease and facet hypertrophy. Upper chest: Advanced emphysema. Circumferential pleural thickening at the right lung apex. Radiopaque cul pulmonary  nodules. Patient with known metastatic lung cancer. Other: Carotid and vertebral artery calcifications. IMPRESSION: 1. No acute fracture or subluxation of the cervical spine. 2. Vague area of sclerosis in the right aspect of C1 corresponding to suspected lesion on prior MRI. No extraosseous mass effect. 3. Multilevel degenerative disc disease and facet hypertrophy. Emphysema (ICD10-J43.9). Electronically Signed   By: Andrea Gasman M.D.   On: 10/26/2023 18:06   CT Head Wo Contrast Result Date: 10/26/2023 CLINICAL DATA:  Head trauma, minor (Age >= 65y) Unwitnessed fall. EXAM: CT HEAD WITHOUT CONTRAST TECHNIQUE: Contiguous axial images were obtained from the base of the skull through the vertex without intravenous contrast. RADIATION DOSE REDUCTION: This exam was performed according to the departmental dose-optimization program which includes automated exposure control, adjustment of the mA and/or kV according to patient size and/or use of iterative reconstruction technique. COMPARISON:  Head CT and brain MRI 09/15/2023 FINDINGS: Brain: Known intracranial metastasis. There is edema within the left cerebellum, similar to prior exam corresponding to largest metastatic lesion. There is similar effacement of the fourth ventricle from prior exam. There is edema within the left occipital lobe at site of metastatic lesion on MRI. Enhancing lesion abutting the falx is faintly visualized in the left parietal lobe, series 3, image 21. Small focus of hyperdensity in the right parietal lobe corresponds to a known metastatic lesion. Many of the metastatic lesions are not well demonstrated by CT. No evidence of hemorrhage or subdural collection. Stable brain volume and periventricular chronic small vessel ischemia. Chronic lacunar infarct in left corona radiata. Vascular: Atherosclerosis of skullbase vasculature without hyperdense vessel or abnormal calcification. Skull: No skull fracture. The left parietal calvarial lesion  on MRI is not well demonstrated by CT. Sinuses/Orbits: Scattered mucosal thickening. No acute finding. No mastoid effusion. Other: No confluent scalp contusion. IMPRESSION: 1. No acute intracranial abnormality. No  skull fracture. 2. Known intracranial metastasis. There is edema within the left cerebellum and left occipital lobe corresponding to known metastatic lesions, similar to CT last month. There is similar effacement of the fourth ventricle from prior exam. Electronically Signed   By: Andrea Gasman M.D.   On: 10/26/2023 17:59     Labs:   Basic Metabolic Panel: No results for input(s): NA, K, CL, CO2, GLUCOSE, BUN, CREATININE, CALCIUM , MG, PHOS in the last 168 hours. GFR Estimated Creatinine Clearance: 64.3 mL/min (by C-G formula based on SCr of 0.96 mg/dL). Liver Function Tests: No results for input(s): AST, ALT, ALKPHOS, BILITOT, PROT, ALBUMIN in the last 168 hours. No results for input(s): LIPASE, AMYLASE in the last 168 hours. No results for input(s): AMMONIA in the last 168 hours. Coagulation profile No results for input(s): INR, PROTIME in the last 168 hours.  CBC: Recent Labs  Lab 10/30/23 0526 10/31/23 0359  WBC 20.4* 14.9*  HGB 11.9* 11.6*  HCT 36.5* 35.7*  MCV 82.4 82.8  PLT 398 379   Cardiac Enzymes: No results for input(s): CKTOTAL, CKMB, CKMBINDEX, TROPONINI in the last 168 hours. BNP: Invalid input(s): POCBNP CBG: No results for input(s): GLUCAP in the last 168 hours. D-Dimer No results for input(s): DDIMER in the last 72 hours. Hgb A1c No results for input(s): HGBA1C in the last 72 hours. Lipid Profile No results for input(s): CHOL, HDL, LDLCALC, TRIG, CHOLHDL, LDLDIRECT in the last 72 hours. Thyroid  function studies No results for input(s): TSH, T4TOTAL, T3FREE, THYROIDAB in the last 72 hours.  Invalid input(s): FREET3 Anemia work up No results for input(s):  VITAMINB12, FOLATE, FERRITIN, TIBC, IRON, RETICCTPCT in the last 72 hours. Microbiology Recent Results (from the past 240 hours)  Surgical pcr screen     Status: Abnormal   Collection Time: 10/29/23  3:50 AM   Specimen: Nasal Mucosa; Nasal Swab  Result Value Ref Range Status   MRSA, PCR NEGATIVE NEGATIVE Final   Staphylococcus aureus POSITIVE (A) NEGATIVE Final    Comment: (NOTE) The Xpert SA Assay (FDA approved for NASAL specimens in patients 64 years of age and older), is one component of a comprehensive surveillance program. It is not intended to diagnose infection nor to guide or monitor treatment. Performed at [facility], [address] [place], [place] [address]     Time coordinating discharge: 45 minutes  Signed: Davide Risdon  [facility] 11/03/2023, 9:38 AM

## 2023-11-05 ENCOUNTER — Other Ambulatory Visit: Payer: Self-pay

## 2023-11-05 ENCOUNTER — Emergency Department (HOSPITAL_COMMUNITY)
Admission: EM | Admit: 2023-11-05 | Discharge: 2023-11-08 | Disposition: A | Discharge status: Home / Self Care | Source: Skilled Nursing Facility | Attending: Emergency Medicine | Admitting: Emergency Medicine

## 2023-11-05 ENCOUNTER — Encounter (HOSPITAL_COMMUNITY): Payer: Self-pay

## 2023-11-05 DIAGNOSIS — Z79899 Other long term (current) drug therapy: Secondary | ICD-10-CM | POA: Diagnosis not present

## 2023-11-05 DIAGNOSIS — Z8546 Personal history of malignant neoplasm of prostate: Secondary | ICD-10-CM | POA: Insufficient documentation

## 2023-11-05 DIAGNOSIS — I10 Essential (primary) hypertension: Secondary | ICD-10-CM | POA: Diagnosis not present

## 2023-11-05 DIAGNOSIS — C7951 Secondary malignant neoplasm of bone: Secondary | ICD-10-CM

## 2023-11-05 DIAGNOSIS — Z8673 Personal history of transient ischemic attack (TIA), and cerebral infarction without residual deficits: Secondary | ICD-10-CM | POA: Insufficient documentation

## 2023-11-05 DIAGNOSIS — Z758 Other problems related to medical facilities and other health care: Secondary | ICD-10-CM | POA: Diagnosis present

## 2023-11-05 DIAGNOSIS — Z789 Other specified health status: Secondary | ICD-10-CM

## 2023-11-05 DIAGNOSIS — J449 Chronic obstructive pulmonary disease, unspecified: Secondary | ICD-10-CM | POA: Insufficient documentation

## 2023-11-05 DIAGNOSIS — Z85118 Personal history of other malignant neoplasm of bronchus and lung: Secondary | ICD-10-CM | POA: Diagnosis not present

## 2023-11-05 DIAGNOSIS — Z515 Encounter for palliative care: Secondary | ICD-10-CM

## 2023-11-05 DIAGNOSIS — S42495A Other nondisplaced fracture of lower end of left humerus, initial encounter for closed fracture: Secondary | ICD-10-CM

## 2023-11-05 DIAGNOSIS — G893 Neoplasm related pain (acute) (chronic): Secondary | ICD-10-CM

## 2023-11-05 DIAGNOSIS — S42495D Other nondisplaced fracture of lower end of left humerus, subsequent encounter for fracture with routine healing: Secondary | ICD-10-CM | POA: Diagnosis not present

## 2023-11-05 DIAGNOSIS — W19XXXD Unspecified fall, subsequent encounter: Secondary | ICD-10-CM | POA: Diagnosis not present

## 2023-11-05 LAB — BASIC METABOLIC PANEL WITH GFR
Anion gap: 13 (ref 5–15)
BUN: 14 mg/dL (ref 8–23)
CO2: 22 mmol/L (ref 22–32)
Calcium: 9.7 mg/dL (ref 8.9–10.3)
Chloride: 96 mmol/L — ABNORMAL LOW (ref 98–111)
Creatinine, Ser: 0.8 mg/dL (ref 0.61–1.24)
GFR, Estimated: >60 mL/min (ref >60)
Glucose, Bld: 114 mg/dL — ABNORMAL HIGH (ref 70–99)
Potassium: 4 mmol/L (ref 3.5–5.1)
Sodium: 131 mmol/L — ABNORMAL LOW (ref 135–145)

## 2023-11-05 LAB — CBC
HCT: 36.5 % — ABNORMAL LOW (ref 39.0–52.0)
Hemoglobin: 11.8 g/dL — ABNORMAL LOW (ref 13.0–17.0)
MCH: 26.8 pg (ref 26.0–34.0)
MCHC: 32.3 g/dL (ref 30.0–36.0)
MCV: 83 fL (ref 80.0–100.0)
Platelets: 450 K/uL — ABNORMAL HIGH (ref 150–400)
RBC: 4.4 MIL/uL (ref 4.22–5.81)
RDW: 20.6 % — ABNORMAL HIGH (ref 11.5–15.5)
WBC: 17.7 K/uL — ABNORMAL HIGH (ref 4.0–10.5)
nRBC: 0 % (ref 0.0–0.2)

## 2023-11-05 MED ORDER — FOLIC ACID 1 MG PO TABS
1.0000 mg | ORAL_TABLET | Freq: Every day | ORAL | Status: DC
  Administered 2023-11-05 – 2023-11-08 (×4): 1 mg via ORAL
  Filled 2023-11-05 (×4): qty 1

## 2023-11-05 MED ORDER — ACETAMINOPHEN 500 MG PO TABS
1000.0000 mg | ORAL_TABLET | Freq: Three times a day (TID) | ORAL | Status: DC | PRN
  Administered 2023-11-07 – 2023-11-08 (×2): 1000 mg via ORAL
  Filled 2023-11-05 (×3): qty 2

## 2023-11-05 MED ORDER — MIRTAZAPINE 15 MG PO TABS
7.5000 mg | ORAL_TABLET | Freq: Every day | ORAL | Status: DC
  Administered 2023-11-05 – 2023-11-07 (×3): 7.5 mg via ORAL
  Filled 2023-11-05 (×3): qty 1

## 2023-11-05 MED ORDER — TRAMADOL HCL 50 MG PO TABS
50.0000 mg | ORAL_TABLET | Freq: Four times a day (QID) | ORAL | Status: DC | PRN
  Administered 2023-11-05 – 2023-11-08 (×6): 50 mg via ORAL
  Filled 2023-11-05 (×7): qty 1

## 2023-11-05 MED ORDER — VITAMIN D 25 MCG (1000 UNIT) PO TABS
1000.0000 [IU] | ORAL_TABLET | Freq: Every day | ORAL | Status: DC
  Administered 2023-11-05 – 2023-11-08 (×4): 1000 [IU] via ORAL
  Filled 2023-11-05 (×4): qty 1

## 2023-11-05 MED ORDER — POLYETHYLENE GLYCOL 3350 17 GM/SCOOP PO POWD: 17.0000 g | Freq: Every day | ORAL | Status: DC | PRN

## 2023-11-05 MED ORDER — ENSURE PLUS HIGH PROTEIN PO LIQD
237.0000 mL | Freq: Two times a day (BID) | ORAL | Status: DC
  Administered 2023-11-08: 237 mL via ORAL
  Filled 2023-11-05 (×4): qty 237

## 2023-11-05 MED ORDER — HYDROXYZINE HCL 25 MG PO TABS
25.0000 mg | ORAL_TABLET | Freq: Every evening | ORAL | Status: DC | PRN
Start: 2023-11-05 — End: 2023-11-08
  Administered 2023-11-06 – 2023-11-08 (×3): 25 mg via ORAL
  Filled 2023-11-05 (×4): qty 1

## 2023-11-05 MED ORDER — ALBUTEROL SULFATE HFA 108 (90 BASE) MCG/ACT IN AERS: 2.0000 | INHALATION_SPRAY | Freq: Four times a day (QID) | RESPIRATORY_TRACT | Status: DC | PRN

## 2023-11-05 MED ORDER — LIDOCAINE 5 % EX PTCH
1.0000 | MEDICATED_PATCH | CUTANEOUS | Status: DC
  Administered 2023-11-05: 1 via TRANSDERMAL
  Filled 2023-11-05 (×2): qty 1

## 2023-11-05 MED ORDER — METHOCARBAMOL 500 MG PO TABS
500.0000 mg | ORAL_TABLET | Freq: Four times a day (QID) | ORAL | Status: DC | PRN
  Administered 2023-11-05 – 2023-11-08 (×5): 500 mg via ORAL
  Filled 2023-11-05 (×6): qty 1

## 2023-11-05 NOTE — ED Provider Notes (Signed)
 [place] EMERGENCY DEPARTMENT AT [facility] Provider Note   CSN: [id number] Arrival date & time: 11/05/23  1320     Patient presents with: From SNF, dislike PT   Terry Page. is a 68 y.o. male.  Who presents to the ED requesting skilled nursing facility placement.  Patient recently seen after a fall and underwent intraoperative repair of left humeral fracture.  Was discharged to skilled nursing facility this past weekend.  He is unhappy with his care and physical therapy there.  States he has not had any pain medicine since leaving the hospital.  Return to requesting help with placement in a different facility   HPI     Prior to Admission medications   Medication Sig Start Date End Date Taking? Authorizing Provider  acetaminophen  (TYLENOL ) 500 MG tablet Take 2 tablets (1,000 mg total) by mouth every 8 (eight) hours as needed for mild pain (pain score 1-3), fever or headache. 11/01/23  Yes Danton Lauraine LABOR, PA-C  albuterol  (VENTOLIN  HFA) 108 (90 Base) MCG/ACT inhaler Inhale 2 puffs into the lungs every 6 (six) hours as needed for wheezing or shortness of breath. 04/23/23  Yes Perri LABOR Meliton Mickey., MD  cholecalciferol (CHOLECALCIFEROL) 25 MCG tablet Take 1 tablet (1,000 Units total) by mouth daily. 11/02/23 01/31/24 Yes McClung, Lauraine LABOR, PA-C  diphenhydrAMINE  (BENADRYL ) 25 MG tablet Take 25 mg by mouth every 4 (four) hours as needed for itching.   Yes [provider]  feeding supplement (ENSURE PLUS HIGH PROTEIN) LIQD Take 237 mLs by mouth 2 (two) times daily between meals. Patient taking differently: Take 237 mLs by mouth 3 (three) times daily before meals. 11/02/23  Yes Dahal, Chapman, MD  folic acid  (FOLVITE ) 1 MG tablet Take 1 tablet (1 mg total) by mouth daily. 03/09/23  Yes Thayil, Irene T, PA-C  hydrOXYzine  (ATARAX ) 25 MG tablet Take 1 tablet (25 mg total) by mouth at bedtime as needed for itching. 08/27/23  Yes Pickenpack-Cousar, Fannie SAILOR, NP  lidocaine   (LIDODERM ) 5 % Place 1 patch onto the skin daily. Remove & Discard patch within 12 hours or as directed by MD. Apply to low back 11/02/23  Yes Dahal, Chapman, MD  mirtazapine  (REMERON ) 7.5 MG tablet Take 1 tablet (7.5 mg total) by mouth at bedtime. 11/02/23  Yes Dahal, Chapman, MD  OXYGEN Inhale 4 L into the lungs continuous.   Yes [provider]  polyethylene glycol powder (MIRALAX ) 17 GM/SCOOP powder Dissolve 1 capful (17g) in 4-8 ounces of liquid and take by mouth daily. 09/17/23  Yes Ghimire, Renato, MD  senna-docusate (STIMULANT LAXATIVE) 8.6-50 MG tablet Take 2 tablets by mouth 2 (two) times daily. May add additional tablet as needed Patient taking differently: Take 2 tablets by mouth 2 (two) times daily. 08/27/23  Yes Pickenpack-Cousar, Fannie SAILOR, NP  traMADol  (ULTRAM ) 50 MG tablet Take 1 tablet (50 mg total) by mouth every 6 (six) hours as needed for up to 5 days for moderate pain (pain score 4-6) or severe pain (pain score 7-10). 11/02/23 11/07/23 Yes Dahal, Chapman, MD  chlorhexidine  (HIBICLENS ) 4 % external liquid Apply 15 mLs (1 Application total) topically as directed for 30 doses. Use as directed daily for 5 days every other week for 6 weeks. 10/29/23   Danton Lauraine LABOR, PA-C  methocarbamol (ROBAXIN) 500 MG tablet Take 1 tablet (500 mg total) by mouth every 6 (six) hours as needed for muscle spasms. 11/01/23   Danton Lauraine LABOR, PA-C  mupirocin ointment WYNEMA)  2 % Place 1 Application into the nose 2 (two) times daily for 60 doses. Use as directed 2 times daily for 5 days every other week for 6 weeks. 10/29/23 11/28/23  Danton Lauraine LABOR, PA-C    Allergies: Ms contin  [morphine ] and Roxicodone  [oxycodone ]    Review of Systems  Updated Vital Signs BP 125/87 (BP Location: Right Arm)   Pulse 100   Temp 97.8 F (36.6 C) (Oral)   Resp 18   Ht 5' 9 (1.753 m)   Wt 60.8 kg   SpO2 100%   BMI 19.79 kg/m   Physical Exam Vitals and nursing note reviewed.  HENT:     Head:  Normocephalic and atraumatic.  Eyes:     Pupils: Pupils are equal, round, and reactive to light.  Cardiovascular:     Rate and Rhythm: Normal rate and regular rhythm.  Pulmonary:     Effort: Pulmonary effort is normal.     Breath sounds: Normal breath sounds.  Abdominal:     Palpations: Abdomen is soft.     Tenderness: There is no abdominal tenderness.  Musculoskeletal:     Comments: Left arm sling in place  Skin:    General: Skin is warm and dry.  Neurological:     Mental Status: He is alert.  Psychiatric:        Mood and Affect: Mood normal.     (all labs ordered are listed, but only abnormal results are displayed) Labs Reviewed  CBC - Abnormal; Notable for the following components:      Result Value   WBC 17.7 (*)    Hemoglobin 11.8 (*)    HCT 36.5 (*)    RDW 20.6 (*)    Platelets 450 (*)    All other components within normal limits  BASIC METABOLIC PANEL WITH GFR - Abnormal; Notable for the following components:   Sodium 131 (*)    Chloride 96 (*)    Glucose, Bld 114 (*)    All other components within normal limits    EKG: None  Radiology: No results found.   Procedures   Medications Ordered in the ED  acetaminophen  (TYLENOL ) tablet 1,000 mg (has no administration in time range)  albuterol  (VENTOLIN  HFA) 108 (90 Base) MCG/ACT inhaler 2 puff (has no administration in time range)  cholecalciferol (VITAMIN D3) 25 MCG (1000 UNIT) tablet 1,000 Units (1,000 Units Oral Given 11/05/23 2043)  feeding supplement (ENSURE PLUS HIGH PROTEIN) liquid 237 mL (has no administration in time range)  folic acid  (FOLVITE ) tablet 1 mg (1 mg Oral Given 11/05/23 2044)  hydrOXYzine  (ATARAX ) tablet 25 mg (has no administration in time range)  lidocaine  (LIDODERM ) 5 % 1 patch (1 patch Transdermal Patch Applied 11/05/23 2042)  methocarbamol (ROBAXIN) tablet 500 mg (500 mg Oral Given 11/05/23 2043)  mirtazapine  (REMERON ) tablet 7.5 mg (7.5 mg Oral Given 11/05/23 2243)  polyethylene glycol  powder (GLYCOLAX /MIRALAX ) container 17 g (has no administration in time range)  traMADol  (ULTRAM ) tablet 50 mg (50 mg Oral Given 11/05/23 2043)    Clinical Course as of 11/05/23 2329  Mon Nov 05, 2023  1920 Patient has been evaluated by ED social worker Cordella Ferry.  We will attempt to find him another nursing facility and a referral has been sent out.  Will obtain PT as well.  Home medications have been ordered.  Patient will continue boarding overnight awaiting nursing facility placement [MP]    Clinical Course User Index [MP] Pamella Ozell LABOR, DO  Medical Decision Making 68 year old male with recent operative fixation of left humerus fracture discharge last week and from this facility to skilled nursing facility.  Presenting requesting help finding a new facility as he is not happy with his care there.  TOC consult ordered  Risk OTC drugs. Prescription drug management.        Final diagnoses:  Resides in skilled nursing facility  Other closed nondisplaced fracture of distal end of left humerus, initial encounter    ED Discharge Orders     None          Pamella Ozell LABOR, DO 11/05/23 2329

## 2023-11-05 NOTE — Progress Notes (Signed)
 [facility] Liaison Note:   This is a current patient with [facility], followed for outpatient palliative care.   Liaisons will continue to follow for discharge disposition.   Please call with any Outpatient palliative or hospice questions or concerns.   Thank you, Eleanor Nail, [facility] liaison  [phone]

## 2023-11-05 NOTE — ED Provider Triage Note (Signed)
 Emergency Medicine Provider Triage Evaluation Note  Terry Page. , a 68 y.o. male  was evaluated in triage.  Pt complains of disliking his SNF. Reports he was well cared for in the hospital here and then was discharged to SNF and does not feel like they are caring for him here and would prefer to have rehab here instead. Denies any current complaints  Review of Systems  Positive:  Negative:   Physical Exam  BP (!) 127/98 (BP Location: Left Arm)   Pulse (!) 111   Temp 98.4 F (36.9 C)   Resp 18   Ht 5' 9 (1.753 m)   Wt 60.8 kg   SpO2 92%   BMI 19.79 kg/m  Gen:   Awake, no distress   Resp:  Normal effort  MSK:   Moves extremities without difficulty  Other:    Medical Decision Making  Medically screening exam initiated at 1:44 PM.  Appropriate orders placed.  [name]. was informed that the remainder of the evaluation will be completed by another provider, this initial triage assessment does not replace that evaluation, and the importance of remaining in the ED until their evaluation is complete.     Nora Lauraine LABOR, PA-C 11/05/23 1345

## 2023-11-05 NOTE — TOC Initial Note (Signed)
 Transition of Care (TOC) - Initial/Assessment Note    Patient Details  Name: Terry Page. MRN: [id number] Date of Birth: [birth year]  Transition of Care [facility]) CM/SW Contact:    Bridget Cordella Simmonds, LCSW Phone Number: 11/05/2023, 7:04 PM  Clinical Narrative:   CSW unable to reach pt by phone as he is in [facility] hallway.  CSW was able to reach pt significant other Reena on her phone, she asked to add in pt niece Jasmine by 3 way call.  They report that pt did not have good experience at [facility] and just laid in bed for 3 days.  They are requesting transfer to another SNF facility and specifically mentioned [facility].  CSW had worked with pt on SNF placement at [facility] from [facility] last week, discussed with them both that pt referral had been sent out to all [place] SNFs that take [employer] and pt had only received one offer from [facility].  CSW informed them that [facility] does not accept [employer].  Options discussed: can send out referral in [place] and see if any new bed offers are made, can send out referral to facilities beyond [facility] that were not tried last time.  They do want referral sent out to determine what other options are available.  Also asking that pt be admitted to [facility] in the meeting--CSW referred them to EDP regarding whether pt would be admitted.   Referral sent out in hub for SNF.                Expected Discharge Plan: Skilled Nursing Facility Barriers to Discharge: SNF Pending bed offer   Patient Goals and CMS Choice            Expected Discharge Plan and Services In-house Referral: Clinical Social Work   Post Acute Care Choice: Skilled Nursing Facility Living arrangements for the past 2 months: Apartment                                      Prior Living Arrangements/Services Living arrangements for the past 2 months: Apartment Lives with:: Self          Need for Family Participation in Patient Care: Yes  (Comment) Care giver support system in place?: Yes (comment) Current home services: Hospice ([facility]) Criminal Activity/Legal Involvement Pertinent to Current Situation/Hospitalization: No - Comment as needed  Activities of Daily Living      Permission Sought/Granted                  Emotional Assessment              Admission diagnosis:  pt from snf Patient Active Problem List   Diagnosis Date Noted   Humerus fracture 10/26/2023   DNR (do not resuscitate) 09/16/2023   Cancer associated pain 09/16/2023   Medication management 09/16/2023   Counseling and coordination of care 09/16/2023   ACP (advance care planning) 09/16/2023   Palliative care encounter 09/16/2023   Need for emotional support 09/16/2023   Malignant neoplasm metastatic to brain ([name]) 09/16/2023   Primary malignant neoplasm of lung metastatic to other site ([name]) 09/16/2023   Ileus ([name]) 09/12/2023   AMS (altered mental status) 04/22/2023   Pressure injury of skin 04/22/2023   Wound eschar of foot 04/21/2023   Cerebrovascular disease 04/21/2023   CVA (cerebral vascular accident) ([name]) 04/20/2023   Goals of care, counseling/discussion 11/24/2022  Pain due to onychomycosis of toenails of both feet 04/19/2022   Claudication 01/13/2022   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   Port-A-Cath in place 07/08/2021   Hyperkalemia 04/28/2021   Elevated LFTs 04/28/2021   Falls 04/28/2021   Chronic back pain 04/28/2021   Hemoptysis 05/18/2020   Protein-calorie malnutrition, severe 05/07/2020   Abdominal pain    Necrotizing pneumonia ([name]) 05/03/2020   Bullous emphysema ([name])    Acute respiratory failure with hypoxia ([name]) 04/12/2020   Pneumonia due to COVID-19 virus 04/10/2020   Primary cancer of left upper lobe of lung ([name]) 04/10/2020   Cavitary pneumonia 04/10/2020   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   Essential hypertension 04/10/2020    HLD (hyperlipidemia) 04/10/2020   Sepsis ([name]) 04/10/2020   Recurrent spontaneous pneumothorax 06/01/2019   Malignant neoplasm of prostate ([name]) 05/20/2019   Hyponatremia 12/04/2017   AKI (acute kidney injury) 12/04/2017   Incidental lung nodule, greater than or equal to 8mm 10/22/2017   COPD with acute exacerbation ([name])    Spontaneous pneumothorax 10/20/2017   Tobacco abuse    Alcohol abuse    PCP:  Rolinda Millman, MD Pharmacy:   [facility] [phone] [place] [place], [place] - [address] AT [facility] OF [facility] [address] [place] [address] Phone: [phone] Fax: [phone]  [place] [facility] [address] [place] [address] Phone: [phone] Fax: [phone]     Social Drivers of Health (SDOH) Social History: SDOH Screenings   Food Insecurity: No Food Insecurity (10/27/2023)  Housing: Low Risk  (10/27/2023)  Transportation Needs: No Transportation Needs (10/27/2023)  Utilities: Not At Risk (10/27/2023)  Depression (PHQ2-9): Low Risk  (08/03/2023)  Social Connections: Socially Isolated (10/27/2023)  Tobacco Use: Medium Risk (11/05/2023)   SDOH Interventions:     Readmission Risk Interventions    09/13/2023   12:28 PM 04/29/2021    2:00 PM  Readmission Risk Prevention Plan  Transportation Screening Complete Complete  PCP or Specialist Appt within 5-7 Days  Complete  PCP or Specialist Appt within 3-5 Days Complete   Home Care Screening  Complete  Medication Review (RN CM)  Complete  HRI or Home Care Consult Complete   Palliative Care Screening Not Applicable   Medication Review (RN Care Manager) Complete

## 2023-11-05 NOTE — NC FL2 (Signed)
 [place]  MEDICAID FL2 LEVEL OF CARE FORM     IDENTIFICATION  Patient Name: Terry Page. Birthdate: 04-03-1955 Sex: male Admission Date (Current Location): 11/05/2023  [facility] and [place] Number:  [employer], [employer]film[employer]video and Address:  The . [facility], [address] [place], [place] [address]      Provider Number: [id number]  Attending Physician Name and Address:  Pamella Ozell LABOR, DO  Relative Name and Phone Number:  Thomas-Scott,Sharon Significant other   [phone]    Current Level of Care: Hospital Recommended Level of Care: Skilled Nursing Facility Prior Approval Number:    Date Approved/Denied:   PASRR Number: [id number]  Discharge Plan: SNF    Current Diagnoses: Patient Active Problem List   Diagnosis Date Noted   Humerus fracture 10/26/2023   DNR (do not resuscitate) 09/16/2023   Cancer associated pain 09/16/2023   Medication management 09/16/2023   Counseling and coordination of care 09/16/2023   ACP (advance care planning) 09/16/2023   Palliative care encounter 09/16/2023   Need for emotional support 09/16/2023   Malignant neoplasm metastatic to brain [facility]) 09/16/2023   Primary malignant neoplasm of lung metastatic to other site [facility]) 09/16/2023   Ileus ([name]) 09/12/2023   AMS (altered mental status) 04/22/2023   Pressure injury of skin 04/22/2023   Wound eschar of foot 04/21/2023   Cerebrovascular disease 04/21/2023   CVA (cerebral vascular accident) ([name]) 04/20/2023   Goals of care, counseling/discussion 11/24/2022   Pain due to onychomycosis of toenails of both feet 04/19/2022   Claudication 01/13/2022   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   Port-A-Cath in place 07/08/2021   Hyperkalemia 04/28/2021   Elevated LFTs 04/28/2021   Falls 04/28/2021   Chronic back pain 04/28/2021   Hemoptysis 05/18/2020   Protein-calorie malnutrition, severe 05/07/2020   Abdominal pain    Necrotizing  pneumonia ([name]) 05/03/2020   Bullous emphysema ([name])    Acute respiratory failure with hypoxia ([name]) 04/12/2020   Pneumonia due to COVID-19 virus 04/10/2020   Primary cancer of left upper lobe of lung ([name]) 04/10/2020   Cavitary pneumonia 04/10/2020   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   Essential hypertension 04/10/2020   HLD (hyperlipidemia) 04/10/2020   Sepsis ([name]) 04/10/2020   Recurrent spontaneous pneumothorax 06/01/2019   Malignant neoplasm of prostate ([name]) 05/20/2019   Hyponatremia 12/04/2017   AKI (acute kidney injury) 12/04/2017   Incidental lung nodule, greater than or equal to 8mm 10/22/2017   COPD with acute exacerbation ([name])    Spontaneous pneumothorax 10/20/2017   Tobacco abuse    Alcohol abuse     Orientation RESPIRATION BLADDER Height & Weight     Self, Time, Situation, Place  O2 Continent Weight: 134 lb (60.8 kg) Height:  5' 9 (175.3 cm)  BEHAVIORAL SYMPTOMS/MOOD NEUROLOGICAL BOWEL NUTRITION STATUS        Diet (see discharge summary)  AMBULATORY STATUS COMMUNICATION OF NEEDS Skin   Limited Assist Verbally Surgical wounds                       Personal Care Assistance Level of Assistance  Bathing, Feeding, Dressing Bathing Assistance: Limited assistance Feeding assistance: Limited assistance Dressing Assistance: Limited assistance     Functional Limitations Info  Sight, Hearing, Speech Sight Info: Adequate Hearing Info: Adequate Speech Info: Adequate    SPECIAL CARE FACTORS FREQUENCY  PT (By licensed PT), OT (By licensed OT)     PT  Frequency: 5x week OT Frequency: 5x week            Contractures Contractures Info: Not present    Additional Factors Info  Code Status, Allergies Code Status Info: DNR Allergies Info: Ms Contin  (Morphine ), Roxicodone  (Oxycodone )           Current Medications (11/05/2023):  This is the current hospital active medication list No current facility-administered medications for  this encounter.   Current Outpatient Medications  Medication Sig Dispense Refill   acetaminophen  (TYLENOL ) 500 MG tablet Take 2 tablets (1,000 mg total) by mouth every 8 (eight) hours as needed for mild pain (pain score 1-3), fever or headache.     albuterol  (VENTOLIN  HFA) 108 (90 Base) MCG/ACT inhaler Inhale 2 puffs into the lungs every 6 (six) hours as needed for wheezing or shortness of breath. (Patient not taking: Reported on 10/27/2023)     chlorhexidine  (HIBICLENS ) 4 % external liquid Apply 15 mLs (1 Application total) topically as directed for 30 doses. Use as directed daily for 5 days every other week for 6 weeks. 946 mL 1   cholecalciferol (CHOLECALCIFEROL) 25 MCG tablet Take 1 tablet (1,000 Units total) by mouth daily.     diphenhydrAMINE  (BENADRYL ) 12.5 MG/5ML elixir Take 5-10 mLs (12.5-25 mg total) by mouth every 4 (four) hours as needed for itching.     feeding supplement (ENSURE PLUS HIGH PROTEIN) LIQD Take 237 mLs by mouth 2 (two) times daily between meals.     folic acid  (FOLVITE ) 1 MG tablet Take 1 tablet (1 mg total) by mouth daily. 90 tablet 3   hydrOXYzine  (ATARAX ) 25 MG tablet Take 1 tablet (25 mg total) by mouth at bedtime as needed for itching. 30 tablet 11   lidocaine  (LIDODERM ) 5 % Place 1 patch onto the skin daily. Remove & Discard patch within 12 hours or as directed by MD. Apply to low back 7 patch 0   methocarbamol (ROBAXIN) 500 MG tablet Take 1 tablet (500 mg total) by mouth every 6 (six) hours as needed for muscle spasms. 28 tablet 0   mirtazapine  (REMERON ) 7.5 MG tablet Take 1 tablet (7.5 mg total) by mouth at bedtime. 7 tablet 0   mupirocin ointment (BACTROBAN) 2 % Place 1 Application into the nose 2 (two) times daily for 60 doses. Use as directed 2 times daily for 5 days every other week for 6 weeks. 60 g 0   polyethylene glycol powder (MIRALAX ) 17 GM/SCOOP powder Dissolve 1 capful (17g) in 4-8 ounces of liquid and take by mouth daily. (Patient taking differently:  Take 17 g by mouth daily as needed for mild constipation.) 238 g 2   senna-docusate (STIMULANT LAXATIVE) 8.6-50 MG tablet Take 2 tablets by mouth 2 (two) times daily. May add additional tablet as needed 150 tablet 3   traMADol  (ULTRAM ) 50 MG tablet Take 1 tablet (50 mg total) by mouth every 6 (six) hours as needed for up to 5 days for moderate pain (pain score 4-6) or severe pain (pain score 7-10). 20 tablet 0     Discharge Medications: Please see discharge summary for a list of discharge medications.  Relevant Imaging Results:  Relevant Lab Results:   Additional Information SSN [id number]  Bridget Cordella Simmonds, LCSW

## 2023-11-05 NOTE — ED Triage Notes (Signed)
 Pt BIB [facility] from [facility] with no medical complaints other than disliking the PT he was receiving there & wanted to be brought to ED. Pt refused v/s by EMS, & A/Ox4. Is initially receiving PT at SNF d/t a recent fall & breaking Lt arm (per EMS), Hx of Lung & brain CA.

## 2023-11-06 LAB — URINALYSIS, ROUTINE W REFLEX MICROSCOPIC
Bilirubin Urine: NEGATIVE
Glucose, UA: NEGATIVE mg/dL
Hgb urine dipstick: NEGATIVE
Ketones, ur: NEGATIVE mg/dL
Leukocytes,Ua: NEGATIVE
Nitrite: NEGATIVE
Protein, ur: NEGATIVE mg/dL
Specific Gravity, Urine: 1.014 (ref 1.005–1.030)
pH: 6 (ref 5.0–8.0)

## 2023-11-06 NOTE — Progress Notes (Addendum)
 Patient recently discharged from [facility] inpatient on 11/03/23 to [facility]. That was his only bed offer during that admission. He and family are wanting a different SNF so referrals were sent again yesterday evening. Pt  has no new SNF offers. Per Inpatient Care Management leadership, pt will need to return to [facility] and seek new placement from there or make arrangements to return home/with family. Pt is stable for discharge from the ED.   CSW met with patient bedside and explained no new facilities are available. Explained that pt would have to return to [facility] and seek placement from there as he is medically stable for discharge. At this point pt calls his significant other on speaker phone and CSW explains situation. S/o explained that she called pt's insurance and identified facilities that take his insurance. CSW explained that even if they take his insurance, it does not mean they can accept him as a patient. CSW explained referrals have been sent again, and [facility] is the only facility that can offer currently which was the same situation as last week prior to pt admitting to [facility]. CSW explained if they do not want pt to return to [facility] they could make arrangements for pt to go home and ICM would arrange [facility]. She maintains that pt needs a different facility. She adds nieces to phone call.   CSW explains situation to niece that current options are for pt to go to [facility] or family can make arrangements for pt to return home with [facility]. CSW answered questions about services that could be provided when pt returns home. Family insisted that [employer] told them that pt can get 40 hours/week of in home care. CSW explained that may be incorrect information as medicare does not cover that much HH; explained the hospital would only be able to arrange [facility] services that would be provided at the house a few times/week. They became argumentative and refused for pt to return to [facility] or go home. They  provided list of facilities([facility], [facility], [place], [facility]) that they want to check with that insurance said are in network. CSW explained that referrals were sent to these facilities last week and last night and none of them have offered a bed. CSW agreed to check again with these facilities but explained if they cannot offer a decision would need to made whether pt is returning to [facility] or opting to go home. Pt's family would not acknowledge that they would have to make a decision. They continued to demand that CSW find pt a new placement. They are also arguing that pt should be admitted and would like to speak with a provider.  Family continued to be argumentative; CSW explained he was stepping away from call and gave phone to pt.   Inpatient Care Management leadership updated.   CSW contacted the following SNF's:   [facility]- no beds [facility]- no beds [facility]- cannot offer [facility]- awaiting response  1410: [facility] has no beds

## 2023-11-06 NOTE — Progress Notes (Signed)
 Physical Therapy Quick Note  PT has completed initial evaluation.    Overall, patient at min assistance level.   PT Follow up recommended: Inpatient follow up therapy, < 3 hours/day Equipment recommended:  Quad Cane, BSC Complete evaluation note to follow.     Bernardino JINNY Ruth, PT, DPT Acute Rehabilitation Office [phone]

## 2023-11-06 NOTE — Progress Notes (Addendum)
 Terry Page now possibly able to make bed offer. CSW awaiting confirmation.   1620: Spoke with [name] at [facility] who confirmed they can offer a bed. CSW called pt's s/o who added niece on three way call. They are agreeable to [facility]. CSW explained a new insurance auth will be needed. Niece requesting medicaid screening; CSW agreed to submit referral to financial navigator.   CMA to initiate SNF auth request.  CSW submitted referral to financial navigator.

## 2023-11-06 NOTE — ED Provider Notes (Signed)
 Received patient in turnover from Dr. Franklyn.  Please see their note for further details of Hx, PE.  Briefly patient is a 68 y.o. male with a From SNF, dislike PT .  Patient with leukocytosis, tachycardia.  Plan for UA.  UA clean.  Will continue to wait for social work disposition.    Emil Share, DO 11/06/23 1948

## 2023-11-06 NOTE — Evaluation (Signed)
 Physical Therapy Evaluation Patient Details Name: Terry Page. MRN: [id number] DOB: 1955/11/06 Today's Date: 11/06/2023  History of Present Illness  68 y.o. male presents to [facility] hospital from SNF, requesting placement at a different SNF for short term rehab. Pt recently underwent L humerus IM nailing on 10/27. 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  Clinical Impression  Pt presents to PT with deficits in functional mobility, gait, balance, endurance, strength, power. Pt requires assistance to perform bed mobility and to transfer due to weakness and lack of LUE use with current WB restrictions. Pt is dependent on UE support of quad cane along with PT assist via gait belt to maintain stability for short household distances of ambulation. Patient will benefit from continued inpatient follow up therapy, <3 hours/day.        If plan is discharge home, recommend the following: A lot of help with walking and/or transfers;A lot of help with bathing/dressing/bathroom;Assistance with cooking/housework;Assist for transportation   Can travel by private vehicle   Yes    Equipment Recommendations Lazear;Wheelchair (measurements PT) (quad cane)  Recommendations for Other Services       Functional Status Assessment Patient has had a recent decline in their functional status and demonstrates the ability to make significant improvements in function in a reasonable and predictable amount of time.     Precautions / Restrictions Precautions Precautions: Fall;Shoulder Type of Shoulder Precautions: NWB LUE, gentle elbow and shoulder ROM as tolerated, sling for comfort Shoulder Interventions: Shoulder sling/immobilizer;For comfort Precaution Booklet Issued: Yes (comment) Recall of Precautions/Restrictions: Intact Required Braces or Orthoses: Sling Restrictions Weight Bearing Restrictions Per Provider Order: Yes LUE Weight Bearing Per Provider Order:  Non weight bearing      Mobility  Bed Mobility Overal bed mobility: Needs Assistance Bed Mobility: Supine to Sit, Sit to Supine     Supine to sit: Mod assist Sit to supine: Max assist        Transfers Overall transfer level: Needs assistance Equipment used: Quad cane Transfers: Sit to/from Stand Sit to Stand: Min assist, From elevated surface                Ambulation/Gait Ambulation/Gait assistance: Editor, Commissioning (Feet): 50 Feet Assistive device: Quad cane Gait Pattern/deviations: Step-through pattern Gait velocity: reduced Gait velocity interpretation: <1.8 ft/sec, indicate of risk for recurrent falls   General Gait Details: slowed step-through gait, mild increase in lateral sway  Stairs            Wheelchair Mobility     Tilt Bed    Modified Rankin (Stroke Patients Only)       Balance Overall balance assessment: Needs assistance Sitting-balance support: No upper extremity supported, Feet supported Sitting balance-Leahy Scale: Fair     Standing balance support: Single extremity supported, Reliant on assistive device for balance Standing balance-Leahy Scale: Poor                               Pertinent Vitals/Pain Pain Assessment Pain Assessment: Faces Faces Pain Scale: Hurts even more Pain Location: L shoulder Pain Descriptors / Indicators: Grimacing Pain Intervention(s): Monitored during session    Home Living Family/patient expects to be discharged to:: Skilled nursing facility Living Arrangements: Alone Available Help at Discharge: Family;Available PRN/intermittently (PRN at night) Type of Home: Apartment Home Access: Level entry       Home Layout: One level  Home Equipment: [place] - single point;Tub bench;Rollator (4 wheels);Grab bars - tub/shower;Grab bars - toilet Additional Comments: pt was most recently at SNF    Prior Function Prior Level of Function : Independent/Modified Independent              Mobility Comments: typically ambulating with a rollator in the home prior to humerus fx, reports not getting therapy at SNF ADLs Comments: typically independent, requiring significant assistance since humerus fx     Extremity/Trunk Assessment   Upper Extremity Assessment Upper Extremity Assessment: LUE deficits/detail LUE Deficits / Details: s/p IM nailing of humerus on 10/27. pt in sling, digits/wrist ROM WFL    Lower Extremity Assessment Lower Extremity Assessment: Generalized weakness    Cervical / Trunk Assessment Cervical / Trunk Assessment: Kyphotic  Communication   Communication Communication: No apparent difficulties Factors Affecting Communication: Reduced clarity of speech    Cognition Arousal: Alert Behavior During Therapy: WFL for tasks assessed/performed   PT - Cognitive impairments: No family/caregiver present to determine baseline                       PT - Cognition Comments: Pt A,Ox4 Following commands: Intact       Cueing Cueing Techniques: Verbal cues     General Comments General comments (skin integrity, edema, etc.): pt reports being on 4L [place] chronically, maintains sats well when ambulating on 4L [place]. pulse estimated at 133 with activity on portable pulse ox    Exercises     Assessment/Plan    PT Assessment Patient needs continued PT services  PT Problem List Decreased strength;Decreased activity tolerance;Decreased range of motion;Decreased balance;Decreased mobility;Decreased knowledge of use of DME;Pain       PT Treatment Interventions DME instruction;Gait training;Functional mobility training;Therapeutic activities;Therapeutic exercise;Balance training;Patient/family education;Wheelchair mobility training    PT Goals (Current goals can be found in the Care Plan section)  Acute Rehab PT Goals Patient Stated Goal: to return to independent mobility PT Goal Formulation: With patient Time For Goal Achievement: 11/20/23 Potential  to Achieve Goals: Fair    Frequency Min 2X/week     Co-evaluation               AM-PAC PT 6 Clicks Mobility  Outcome Measure Help needed turning from your back to your side while in a flat bed without using bedrails?: A Lot Help needed moving from lying on your back to sitting on the side of a flat bed without using bedrails?: A Lot Help needed moving to and from a bed to a chair (including a wheelchair)?: A Little Help needed standing up from a chair using your arms (e.g., wheelchair or bedside chair)?: A Little Help needed to walk in hospital room?: A Little Help needed climbing 3-5 steps with a railing? : Total 6 Click Score: 14    End of Session Equipment Utilized During Treatment: Gait belt;Oxygen Activity Tolerance: Patient tolerated treatment well Patient left: in bed;with call bell/phone within reach Nurse Communication: Mobility status PT Visit Diagnosis: Unsteadiness on feet (R26.81);History of falling (Z91.81);Muscle weakness (generalized) (M62.81);Pain Pain - Right/Left: Left Pain - part of body: Shoulder    Time: 9159-9089 PT Time Calculation (min) (ACUTE ONLY): 30 min   Charges:   PT Evaluation $PT Eval Low Complexity: 1 Low   PT General Charges $$ ACUTE PT VISIT: 1 Visit         Bernardino JINNY Ruth, PT, DPT Acute Rehabilitation Office [phone]   Bernardino JINNY Ruth 11/06/2023, 11:48 AM

## 2023-11-07 NOTE — ED Notes (Signed)
 Family at bedside left- warm blanket given.

## 2023-11-07 NOTE — ED Notes (Signed)
 Dressing changed. Pt tolerated well. Pt requesting more pain meds.

## 2023-11-07 NOTE — ED Notes (Signed)
Pt repositioned in bed and eating breakfast.

## 2023-11-07 NOTE — ED Provider Notes (Signed)
 Emergency Medicine Observation Re-evaluation Note  Terry Audi. is a 68 y.o. male, seen on rounds today.  Pt initially presented to the ED for complaints of From SNF, dislike PT Currently, the patient is resting comfortably in his bed eating some breakfast.  Physical Exam  BP 108/89 (BP Location: Right Arm)   Pulse 94   Temp (!) 95.5 F (35.3 C) (Oral)   Resp 17   Ht 5' 9 (1.753 m)   Wt 60.8 kg   SpO2 96%   BMI 19.79 kg/m  Physical Exam General: No agitation or distress Cardiac: No murmur on my initial exam Lungs: Clear breath sounds bilaterally Psych: No agitation  ED Course / MDM  EKG:   I have reviewed the labs performed to date as well as medications administered while in observation.  Recent changes in the last 24 hours include none reported by overnight nursing.  Plan  Current plan is for awaiting placement.    Terry Page, Lonni PARAS, MD 11/07/23 [phone]

## 2023-11-07 NOTE — Progress Notes (Signed)
 [facility] ED [facility] Liaison Note:   This is a current patient with [facility], followed for outpatient palliative care.   Liaisons will continue to follow for discharge disposition.   Please call with any Outpatient palliative or hospice questions or concerns.   Thank you, Inocente Jacobs BSN, [facility] liaison  [phone]

## 2023-11-07 NOTE — ED Notes (Signed)
 Patient is resting comfortably.

## 2023-11-07 NOTE — Progress Notes (Addendum)
 SNF shara is pending at this time.   1150: Auth still pending  1600: Auth still pending; CSW notified pt's s/o

## 2023-11-08 MED ORDER — POLYETHYLENE GLYCOL 3350 17 G PO PACK
17.0000 g | PACK | Freq: Every day | ORAL | Status: DC | PRN
  Administered 2023-11-08: 17 g via ORAL
  Filled 2023-11-08: qty 1

## 2023-11-08 MED ORDER — TRAMADOL HCL 50 MG PO TABS
50.0000 mg | ORAL_TABLET | Freq: Four times a day (QID) | ORAL | 0 refills | Status: AC | PRN
Start: 2023-11-08 — End: 2023-11-13

## 2023-11-08 NOTE — ED Notes (Addendum)
 Pt woke up due to pain and requesting meds. Denies having to use restroom.

## 2023-11-08 NOTE — ED Provider Notes (Signed)
 Emergency Medicine Observation Re-evaluation Note  Terry Page. is a 68 y.o. male, seen on rounds today.  Pt initially presented to the ED for complaints of From SNF, dislike PT Currently, the patient is awake resting in bed with no new complaints.  Requesting to see PT which has been ordered for him.  Physical Exam  BP 122/74   Pulse (!) 104   Temp 97.6 F (36.4 C)   Resp 18   Ht 5' 9 (1.753 m)   Wt 60.8 kg   SpO2 98%   BMI 19.79 kg/m  Physical Exam General: Awake and alert no acute distress Cardiac: Regular rate Lungs: No increased work of breathing Psych:/Cooperative  ED Course / MDM  EKG:   I have reviewed the labs performed to date as well as medications administered while in observation.  Recent changes in the last 24 hours include patient remains medically cleared, pending social work for placement.  Plan  Current plan is for SNF/placement.    Kingsley, Emiline Mancebo K, DO 11/08/23 [phone]

## 2023-11-08 NOTE — ED Notes (Signed)
 Sent med request to pharm

## 2023-11-08 NOTE — ED Notes (Signed)
 Report given to Education Officer, [facility] at [facility]

## 2023-11-08 NOTE — Discharge Instructions (Signed)
Return to the ED if needed.

## 2023-11-08 NOTE — Progress Notes (Signed)
 Auth approved for [facility] 11/08/23- 11/12 NRD 88/86 Rzmu#[id number]. [facility] ready to admit today.  Provider and RN aware. RN to call report to [phone]  and arrange [facility]AR transport.  CSW called and notified pt's significant other.

## 2023-11-08 NOTE — Progress Notes (Signed)
 [facility] barrows for [facility] SNF remains pending at this time.   Julien Das, MSW, LCSW [phone] (coverage)

## 2023-11-08 NOTE — ED Notes (Signed)
 [facility] called no ETA at this time

## 2023-11-08 NOTE — ED Notes (Signed)
 Called [facility] where he is a patient and spoke to [place] to confirm code status. Pt is listed as a DNR per their chart.

## 2023-11-08 NOTE — ED Notes (Addendum)
 Pt having difficulty urinating. States he has never had this problem before. PT given more to drink and encouraged to intake more. Denying discomfort. PT states he has not had BM in several days. Will give PRN miralax 

## 2023-11-08 NOTE — ED Notes (Addendum)
 Pt  understand DC instructions and care plans, facility aware of ETA

## 2023-11-16 ENCOUNTER — Encounter: Payer: Self-pay | Admitting: Hematology and Oncology

## 2023-12-03 DEATH — deceased

## 2024-01-17 ENCOUNTER — Encounter: Payer: Self-pay | Admitting: Hematology and Oncology

## 2024-01-23 ENCOUNTER — Encounter: Payer: Self-pay | Admitting: Hematology and Oncology

## 2024-02-22 ENCOUNTER — Inpatient Hospital Stay: Admitting: Hematology and Oncology

## 2024-02-22 ENCOUNTER — Inpatient Hospital Stay

## 2024-03-09 IMAGING — CT CT CHEST-ABD-PELV W/ CM
2 of 5 series · 12 of 36 positions shown, 14 images · IV contrast (APPLIED)
Comparison: CT of the chest from 02/03/2021.  PET-CT 02/06/2020

CLINICAL DATA: Restaging non-small cell lung cancer. Assess
treatment response.

EXAM:
CT CHEST, ABDOMEN, AND PELVIS WITH CONTRAST
TECHNIQUE: Multidetector CT imaging of the chest, abdomen and pelvis was
performed following the standard protocol during bolus
administration of intravenous contrast.

[Series 2: cap with · axial · 0.95mm/px · z∈[-328,+192]mm · 9 of 130 slices shown, 11 images]
[im 13/130  mediastinal]
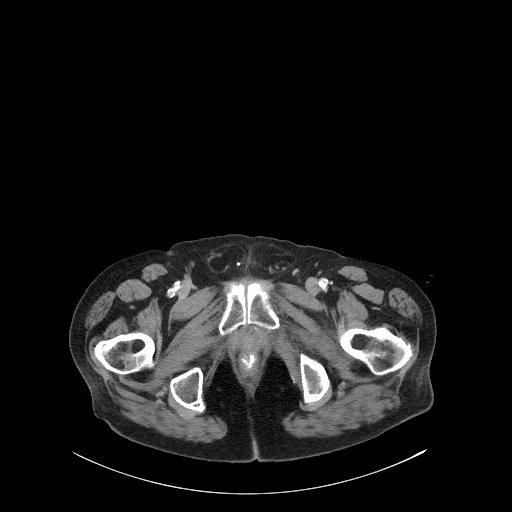
[im 13/130  bone]
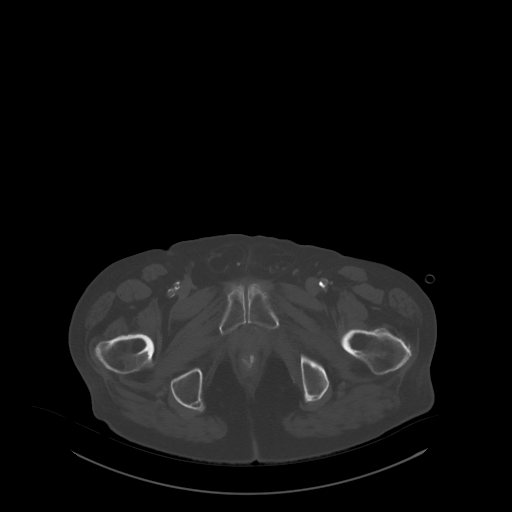
[im 26/130  mediastinal]
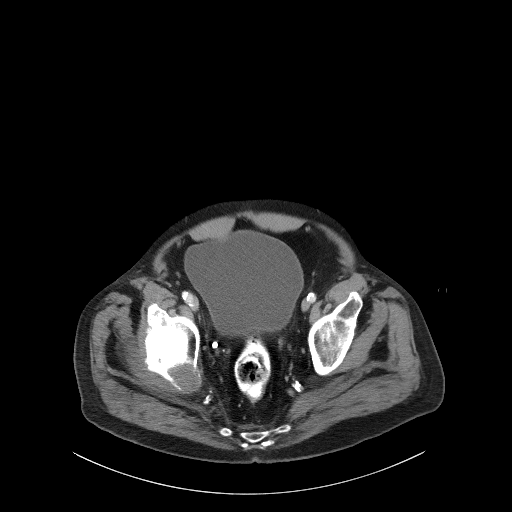
[im 39/130  mediastinal]
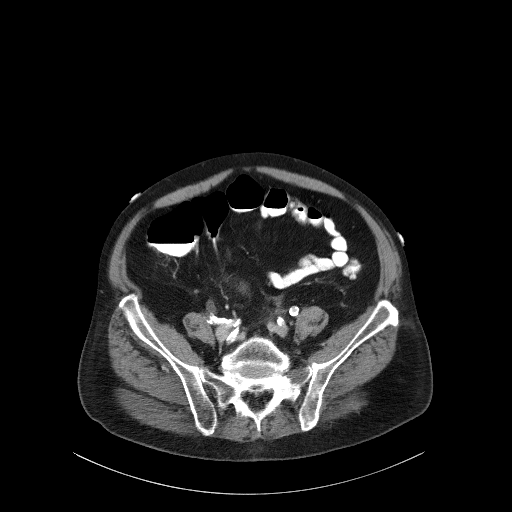
[im 52/130  mediastinal]
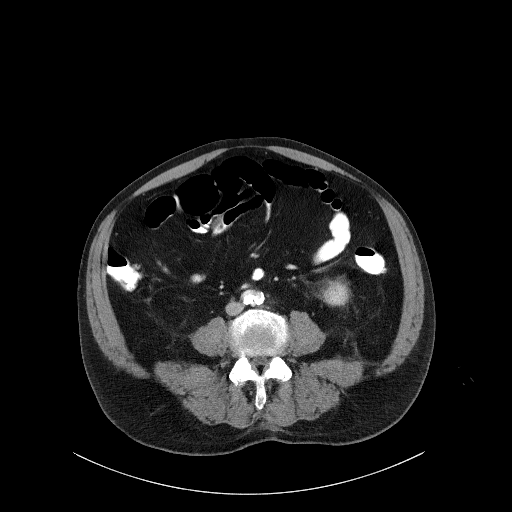
[im 65/130  mediastinal]
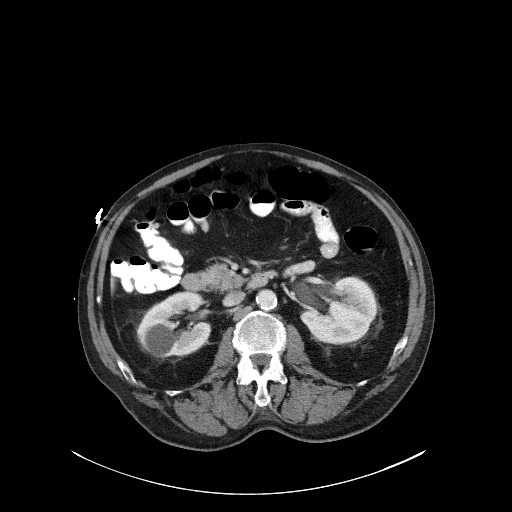
[im 78/130  mediastinal]
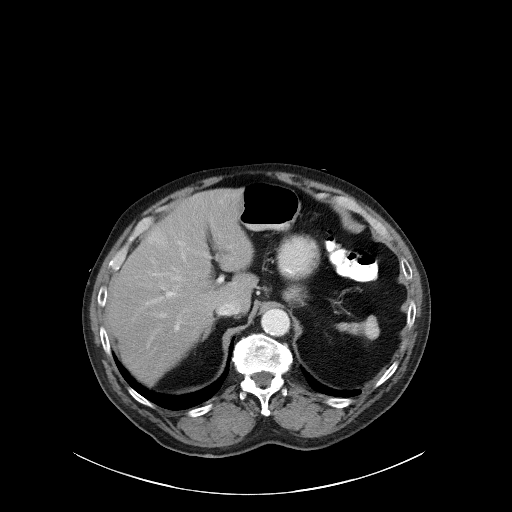
[im 91/130  mediastinal]
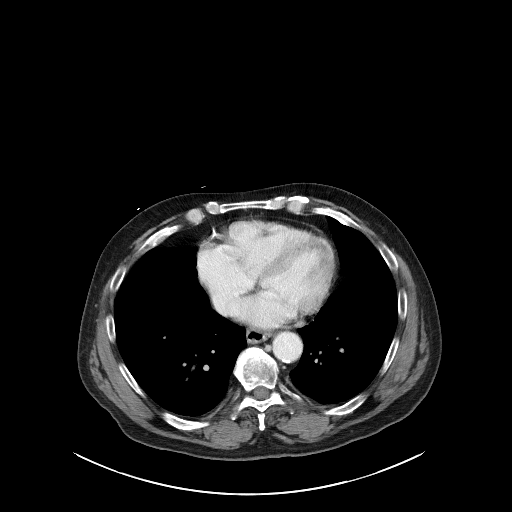
[im 104/130  mediastinal]
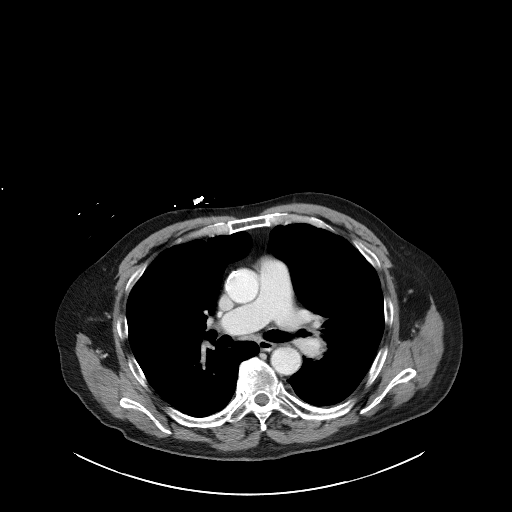
[im 117/130  mediastinal]
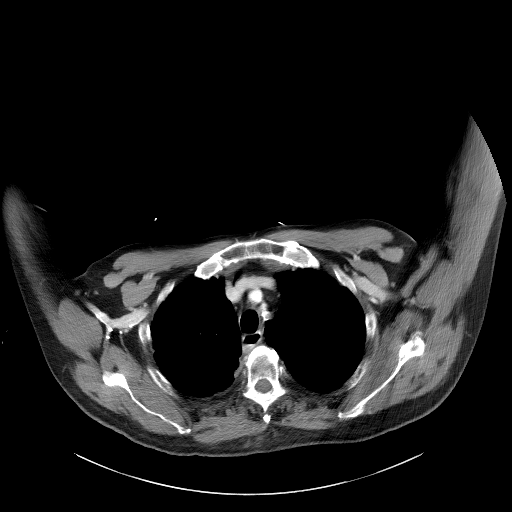
[im 117/130  bone]
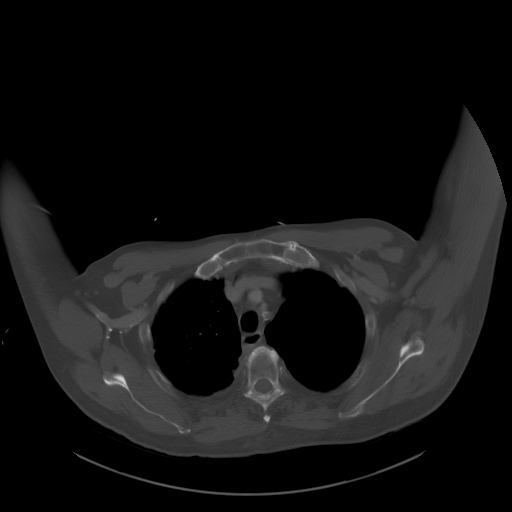

[Series 5: coronals · coronal · 0.93mm/px · 3 of 183 slices shown]
[im 37/183  mediastinal]
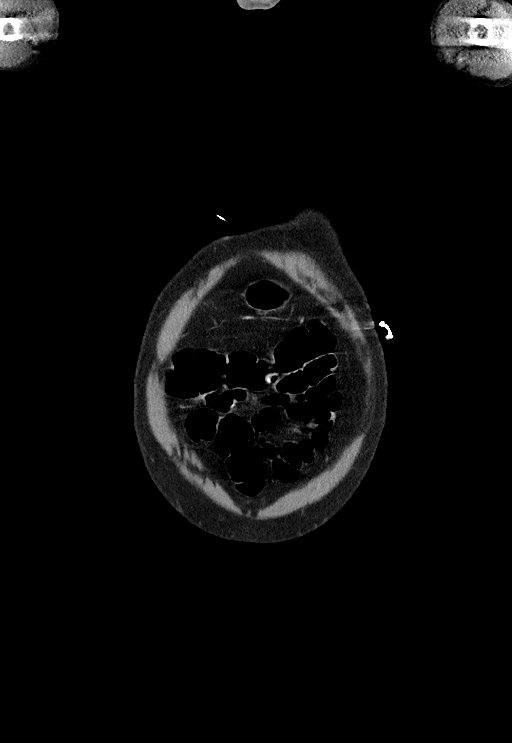
[im 73/183  mediastinal]
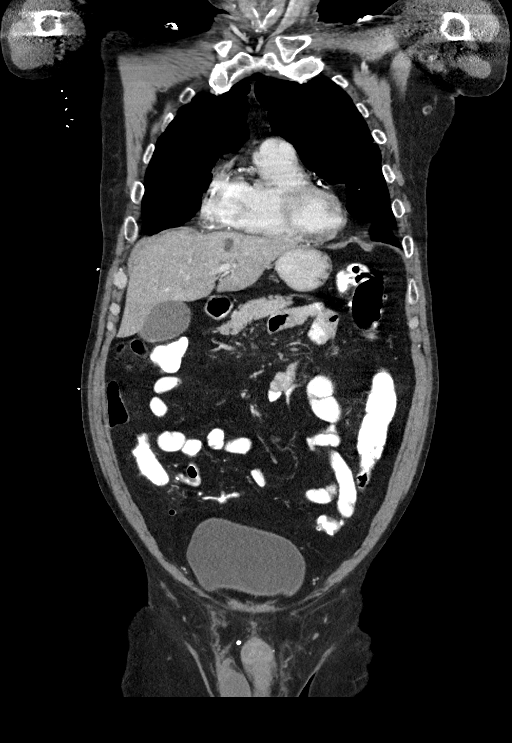
[im 110/183  mediastinal]
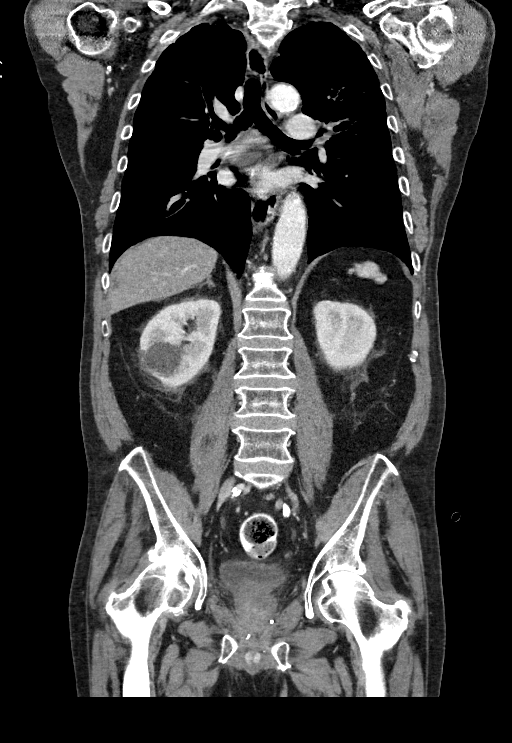

[12 of 36 positions shown; findings below may reference images not displayed]

RADIATION DOSE REDUCTION: This exam was performed according to the
departmental dose-optimization program which includes automated
exposure control, adjustment of the mA and/or kV according to
patient size and/or use of iterative reconstruction technique.

CONTRAST:  100mL OMNIPAQUE IOHEXOL 300 MG/ML  SOLN
FINDINGS: CT CHEST FINDINGS

Cardiovascular: Heart size and mediastinal contours are
unremarkable. Aortic atherosclerosis and coronary artery
calcifications. No pericardial effusion.

Mediastinum/Nodes: Thyroid gland, trachea, and esophagus are
unremarkable. No enlarged axillary, supraclavicular, mediastinal, or
hilar lymph nodes.

Lungs/Pleura: No pleural effusion. No airspace consolidation.
Moderate to severe changes of paraseptal and emphysema with bullous
change. Further retraction of fibrosis and architectural distortion
within the central left upper lobe is identified corresponding to
the treated tumor site. Central nodular density in this area
measures 1.1 x 0.8 cm, image 44/4. On 02/03/2021 this measured 1.2 x
1.1 cm. Soft tissue thickening along the margin of the left upper
lobe bronchus referenced on the previous exam is stable compared
with PET-CT from 02/06/2020 measuring 0.8 x 0.6 cm, image 62/4. No
new pulmonary nodule or mass identified.

Musculoskeletal: There is a expansile lytic lesion involving the
left scapular spine and overlying musculature. This measures 3.6 x
2.5 cm, image [DATE]. On 02/03/2021 this measured 2.9 x 2.3 cm. This is
a new finding when compared with 09/13/2020 and is concerning for
lytic bone metastases.

CT ABDOMEN PELVIS FINDINGS

Hepatobiliary: Simple appearing cyst is noted within left lobe of
liver measuring 1.3 cm. No suspicious liver abnormality noted.
Gallbladder negative. No bile duct dilatation.

Pancreas: Unremarkable. No pancreatic ductal dilatation or
surrounding inflammatory changes.

Spleen: Left upper quadrant splenosis is again noted.

Adrenals/Urinary Tract: Normal adrenal glands. There are 2 Bosniak
class 1 cyst within the right kidney measuring up to 3.5 cm, image
68/2. There is mild left-sided hydronephrosis with dilatation of the
extrarenal pelvis to the level of the UPJ. No hydroureter or
ureteral lithiasis. Urinary bladder is unremarkable.

Stomach/Bowel: Small hiatal hernia. Appendix is normal. No bowel
wall thickening, inflammation or distension. Sigmoid diverticulosis
without acute inflammation.

Vascular/Lymphatic: Aortic atherosclerosis. No abdominopelvic
adenopathy.

Reproductive: Prostate is unremarkable.

Other: No abdominal wall hernia or abnormality. No abdominopelvic
ascites.

Musculoskeletal: There is a large expansile lytic lesion with soft
tissue component involving the right iliac bone and posterior column
of right acetabulum. This measures 3.7 x 2.8 cm and is new when
compared most recent examination through the pelvis from
04/25/2020. Lytic lesion involving the left sacral wing measures
3.5 x 2.3 cm, image 97/2. this is also new when compared with
04/25/2020. There is an adjacent lesion within the left sacrum which
measures 2.5 x 2.0 cm, image 97/2. Also new. Lytic lesion within the
posterior left iliac bone adjacent to the SI joint measures 1.7 x
1.5 cm. New from previous exam. Within the proximal aspect of the
left sacral wing there is a lucent lesion measuring 2.4 x 1.4 cm,
image 92/2.
IMPRESSION: 1. Continued retraction of fibrosis and architectural distortion
within the central left upper lobe corresponding to the treated
tumor site.
2. Multifocal lytic bone metastases are identified. Lesion within
the spine of the left scapula is increased when compared with
02/03/2021. Lytic lesions involving bilateral iliac bones and left
sacral wing are new when compared with the most recent imaging
through the pelvis dated 04/25/2020.
3. Chronic left hydronephrosis with dilatation of the extrarenal
pelvis to the left UPJ. Imaging findings are favored to represent
sequelae of partial chronic UPJ obstruction.
4. Aortic Atherosclerosis (TZP2Y-MBG.G) and Emphysema (TZP2Y-Z1H.3).
